# Patient Record
Sex: Female | Born: 1975 | Race: White | Hispanic: No | Smoking: Former smoker
Health system: Southern US, Community
[De-identification: ages and names within clinical notes are randomized; demographics above are authoritative.]

## PROBLEM LIST (undated history)

## (undated) DIAGNOSIS — R87619 Unspecified abnormal cytological findings in specimens from cervix uteri: Secondary | ICD-10-CM

## (undated) DIAGNOSIS — M546 Pain in thoracic spine: Secondary | ICD-10-CM

## (undated) DIAGNOSIS — F411 Generalized anxiety disorder: Secondary | ICD-10-CM

## (undated) DIAGNOSIS — F410 Panic disorder [episodic paroxysmal anxiety] without agoraphobia: Secondary | ICD-10-CM

## (undated) DIAGNOSIS — K439 Ventral hernia without obstruction or gangrene: Secondary | ICD-10-CM

## (undated) DIAGNOSIS — R002 Palpitations: Secondary | ICD-10-CM

## (undated) DIAGNOSIS — IMO0002 Reserved for concepts with insufficient information to code with codable children: Secondary | ICD-10-CM

## (undated) DIAGNOSIS — N390 Urinary tract infection, site not specified: Secondary | ICD-10-CM

## (undated) DIAGNOSIS — M5412 Radiculopathy, cervical region: Secondary | ICD-10-CM

## (undated) DIAGNOSIS — M797 Fibromyalgia: Secondary | ICD-10-CM

## (undated) DIAGNOSIS — F341 Dysthymic disorder: Secondary | ICD-10-CM

## (undated) DIAGNOSIS — M069 Rheumatoid arthritis, unspecified: Secondary | ICD-10-CM

## (undated) DIAGNOSIS — F17200 Nicotine dependence, unspecified, uncomplicated: Secondary | ICD-10-CM

## (undated) DIAGNOSIS — R5381 Other malaise: Secondary | ICD-10-CM

## (undated) DIAGNOSIS — T8859XA Other complications of anesthesia, initial encounter: Secondary | ICD-10-CM

## (undated) DIAGNOSIS — M1990 Unspecified osteoarthritis, unspecified site: Secondary | ICD-10-CM

## (undated) DIAGNOSIS — J309 Allergic rhinitis, unspecified: Secondary | ICD-10-CM

## (undated) DIAGNOSIS — R1310 Dysphagia, unspecified: Secondary | ICD-10-CM

## (undated) DIAGNOSIS — A630 Anogenital (venereal) warts: Secondary | ICD-10-CM

## (undated) DIAGNOSIS — K219 Gastro-esophageal reflux disease without esophagitis: Secondary | ICD-10-CM

## (undated) DIAGNOSIS — N809 Endometriosis, unspecified: Secondary | ICD-10-CM

## (undated) DIAGNOSIS — F1110 Opioid abuse, uncomplicated: Secondary | ICD-10-CM

## (undated) DIAGNOSIS — F319 Bipolar disorder, unspecified: Secondary | ICD-10-CM

## (undated) DIAGNOSIS — B009 Herpesviral infection, unspecified: Secondary | ICD-10-CM

## (undated) DIAGNOSIS — N83209 Unspecified ovarian cyst, unspecified side: Secondary | ICD-10-CM

## (undated) DIAGNOSIS — K222 Esophageal obstruction: Secondary | ICD-10-CM

## (undated) DIAGNOSIS — R5383 Other fatigue: Secondary | ICD-10-CM

## (undated) DIAGNOSIS — K5900 Constipation, unspecified: Secondary | ICD-10-CM

## (undated) DIAGNOSIS — N289 Disorder of kidney and ureter, unspecified: Secondary | ICD-10-CM

## (undated) DIAGNOSIS — L819 Disorder of pigmentation, unspecified: Secondary | ICD-10-CM

## (undated) DIAGNOSIS — F509 Eating disorder, unspecified: Secondary | ICD-10-CM

## (undated) DIAGNOSIS — R630 Anorexia: Secondary | ICD-10-CM

## (undated) DIAGNOSIS — J984 Other disorders of lung: Secondary | ICD-10-CM

## (undated) HISTORY — PX: OTHER SURGICAL HISTORY: SHX169

## (undated) HISTORY — PX: HERNIA REPAIR: SHX51

## (undated) HISTORY — PX: ENDOMETRIAL ABLATION: SHX621

## (undated) HISTORY — DX: Constipation, unspecified: K59.00

## (undated) HISTORY — PX: DILATION AND CURETTAGE OF UTERUS: SHX78

## (undated) HISTORY — DX: Anorexia: R63.0

## (undated) HISTORY — PX: PARTIAL HYSTERECTOMY: SHX80

## (undated) HISTORY — PX: GUM SURGERY: SHX658

## (undated) HISTORY — DX: Anogenital (venereal) warts: A63.0

## (undated) HISTORY — DX: Esophageal obstruction: K22.2

## (undated) HISTORY — PX: ABDOMINAL SURGERY: SHX537

## (undated) HISTORY — DX: Allergic rhinitis, unspecified: J30.9

## (undated) HISTORY — DX: Eating disorder, unspecified: F50.9

## (undated) HISTORY — DX: Generalized anxiety disorder: F41.1

## (undated) HISTORY — DX: Other malaise: R53.81

## (undated) HISTORY — DX: Gastro-esophageal reflux disease without esophagitis: K21.9

## (undated) HISTORY — PX: SHOULDER SURGERY: SHX246

## (undated) HISTORY — DX: Palpitations: R00.2

## (undated) HISTORY — DX: Other fatigue: R53.83

## (undated) HISTORY — DX: Dysphagia, unspecified: R13.10

## (undated) HISTORY — DX: Disorder of pigmentation, unspecified: L81.9

## (undated) HISTORY — DX: Radiculopathy, cervical region: M54.12

## (undated) HISTORY — PX: ABLATION ON ENDOMETRIOSIS: SHX5787

## (undated) HISTORY — DX: Dysthymic disorder: F34.1

## (undated) HISTORY — DX: Ventral hernia without obstruction or gangrene: K43.9

## (undated) HISTORY — DX: Pain in thoracic spine: M54.6

## (undated) HISTORY — DX: Herpesviral infection, unspecified: B00.9

## (undated) HISTORY — DX: Bipolar disorder, unspecified: F31.9

## (undated) HISTORY — DX: Endometriosis, unspecified: N80.9

## (undated) HISTORY — DX: Other disorders of lung: J98.4

## (undated) HISTORY — DX: Urinary tract infection, site not specified: N39.0

## (undated) HISTORY — DX: Nicotine dependence, unspecified, uncomplicated: F17.200

## (undated) HISTORY — PX: ABDOMINAL WALL MESH  REMOVAL: SHX1116

## (undated) HISTORY — DX: Unspecified osteoarthritis, unspecified site: M19.90

---

## 1998-02-04 ENCOUNTER — Encounter: Admission: RE | Admit: 1998-02-04 | Discharge: 1998-02-04 | Payer: Self-pay | Admitting: Sports Medicine

## 1998-02-07 ENCOUNTER — Encounter: Admission: RE | Admit: 1998-02-07 | Discharge: 1998-02-07 | Payer: Self-pay | Admitting: Family Medicine

## 1998-02-13 ENCOUNTER — Encounter: Admission: RE | Admit: 1998-02-13 | Discharge: 1998-02-13 | Payer: Self-pay | Admitting: Family Medicine

## 1998-02-15 ENCOUNTER — Emergency Department (HOSPITAL_COMMUNITY): Admission: EM | Admit: 1998-02-15 | Discharge: 1998-02-15 | Payer: Self-pay | Admitting: Emergency Medicine

## 1998-02-20 ENCOUNTER — Encounter: Admission: RE | Admit: 1998-02-20 | Discharge: 1998-02-20 | Payer: Self-pay | Admitting: Family Medicine

## 1998-05-09 ENCOUNTER — Encounter: Admission: RE | Admit: 1998-05-09 | Discharge: 1998-05-09 | Payer: Self-pay | Admitting: Family Medicine

## 1998-06-01 ENCOUNTER — Emergency Department (HOSPITAL_COMMUNITY): Admission: EM | Admit: 1998-06-01 | Discharge: 1998-06-01 | Payer: Self-pay | Admitting: Emergency Medicine

## 1998-07-02 ENCOUNTER — Encounter: Admission: RE | Admit: 1998-07-02 | Discharge: 1998-07-02 | Payer: Self-pay | Admitting: Family Medicine

## 1998-07-10 ENCOUNTER — Encounter: Admission: RE | Admit: 1998-07-10 | Discharge: 1998-07-10 | Payer: Self-pay | Admitting: Family Medicine

## 1998-08-20 ENCOUNTER — Encounter: Admission: RE | Admit: 1998-08-20 | Discharge: 1998-08-20 | Payer: Self-pay | Admitting: Family Medicine

## 1998-08-27 ENCOUNTER — Encounter: Admission: RE | Admit: 1998-08-27 | Discharge: 1998-08-27 | Payer: Self-pay | Admitting: Family Medicine

## 1998-09-30 ENCOUNTER — Encounter: Admission: RE | Admit: 1998-09-30 | Discharge: 1998-09-30 | Payer: Self-pay | Admitting: Family Medicine

## 1998-10-10 ENCOUNTER — Emergency Department (HOSPITAL_COMMUNITY): Admission: EM | Admit: 1998-10-10 | Discharge: 1998-10-10 | Payer: Self-pay | Admitting: Emergency Medicine

## 1998-10-20 ENCOUNTER — Other Ambulatory Visit: Admission: RE | Admit: 1998-10-20 | Discharge: 1998-10-20 | Payer: Self-pay | Admitting: Family Medicine

## 1998-10-20 ENCOUNTER — Encounter: Admission: RE | Admit: 1998-10-20 | Discharge: 1998-10-20 | Payer: Self-pay | Admitting: Sports Medicine

## 1999-01-26 ENCOUNTER — Encounter: Admission: RE | Admit: 1999-01-26 | Discharge: 1999-01-26 | Payer: Self-pay | Admitting: Family Medicine

## 1999-03-09 ENCOUNTER — Encounter: Admission: RE | Admit: 1999-03-09 | Discharge: 1999-03-09 | Payer: Self-pay | Admitting: Family Medicine

## 1999-03-16 ENCOUNTER — Emergency Department (HOSPITAL_COMMUNITY): Admission: EM | Admit: 1999-03-16 | Discharge: 1999-03-16 | Payer: Self-pay | Admitting: Emergency Medicine

## 1999-04-15 ENCOUNTER — Encounter: Admission: RE | Admit: 1999-04-15 | Discharge: 1999-04-15 | Payer: Self-pay | Admitting: Family Medicine

## 1999-04-17 ENCOUNTER — Encounter: Admission: RE | Admit: 1999-04-17 | Discharge: 1999-04-17 | Payer: Self-pay | Admitting: Family Medicine

## 1999-04-23 ENCOUNTER — Encounter: Admission: RE | Admit: 1999-04-23 | Discharge: 1999-04-23 | Payer: Self-pay | Admitting: Family Medicine

## 1999-04-30 ENCOUNTER — Encounter: Admission: RE | Admit: 1999-04-30 | Discharge: 1999-04-30 | Payer: Self-pay | Admitting: Sports Medicine

## 1999-05-26 ENCOUNTER — Emergency Department (HOSPITAL_COMMUNITY): Admission: EM | Admit: 1999-05-26 | Discharge: 1999-05-26 | Payer: Self-pay | Admitting: Emergency Medicine

## 1999-05-28 ENCOUNTER — Encounter: Admission: RE | Admit: 1999-05-28 | Discharge: 1999-05-28 | Payer: Self-pay | Admitting: Family Medicine

## 1999-07-28 ENCOUNTER — Encounter: Admission: RE | Admit: 1999-07-28 | Discharge: 1999-07-28 | Payer: Self-pay | Admitting: Family Medicine

## 1999-08-28 ENCOUNTER — Encounter: Admission: RE | Admit: 1999-08-28 | Discharge: 1999-08-28 | Payer: Self-pay | Admitting: Sports Medicine

## 1999-09-17 ENCOUNTER — Encounter: Admission: RE | Admit: 1999-09-17 | Discharge: 1999-09-17 | Payer: Self-pay | Admitting: Family Medicine

## 1999-12-09 ENCOUNTER — Inpatient Hospital Stay (HOSPITAL_COMMUNITY): Admission: AD | Admit: 1999-12-09 | Discharge: 1999-12-09 | Payer: Self-pay | Admitting: *Deleted

## 1999-12-09 ENCOUNTER — Encounter: Payer: Self-pay | Admitting: *Deleted

## 2000-02-05 ENCOUNTER — Encounter: Admission: RE | Admit: 2000-02-05 | Discharge: 2000-02-05 | Payer: Self-pay | Admitting: Family Medicine

## 2000-02-28 ENCOUNTER — Inpatient Hospital Stay (HOSPITAL_COMMUNITY): Admission: AD | Admit: 2000-02-28 | Discharge: 2000-02-28 | Payer: Self-pay | Admitting: Obstetrics & Gynecology

## 2000-02-29 ENCOUNTER — Inpatient Hospital Stay (HOSPITAL_COMMUNITY): Admission: RE | Admit: 2000-02-29 | Discharge: 2000-02-29 | Payer: Self-pay | Admitting: Family Medicine

## 2000-02-29 ENCOUNTER — Encounter: Payer: Self-pay | Admitting: Family Medicine

## 2000-03-03 ENCOUNTER — Encounter: Admission: RE | Admit: 2000-03-03 | Discharge: 2000-03-03 | Payer: Self-pay | Admitting: Obstetrics

## 2000-03-10 ENCOUNTER — Encounter: Payer: Self-pay | Admitting: Emergency Medicine

## 2000-03-10 ENCOUNTER — Emergency Department (HOSPITAL_COMMUNITY): Admission: EM | Admit: 2000-03-10 | Discharge: 2000-03-10 | Payer: Self-pay | Admitting: Emergency Medicine

## 2000-03-28 ENCOUNTER — Emergency Department (HOSPITAL_COMMUNITY): Admission: EM | Admit: 2000-03-28 | Discharge: 2000-03-28 | Payer: Self-pay | Admitting: *Deleted

## 2000-04-04 ENCOUNTER — Emergency Department (HOSPITAL_COMMUNITY): Admission: EM | Admit: 2000-04-04 | Discharge: 2000-04-04 | Payer: Self-pay | Admitting: Emergency Medicine

## 2000-04-06 ENCOUNTER — Emergency Department (HOSPITAL_COMMUNITY): Admission: EM | Admit: 2000-04-06 | Discharge: 2000-04-06 | Payer: Self-pay | Admitting: Emergency Medicine

## 2000-04-13 ENCOUNTER — Emergency Department (HOSPITAL_COMMUNITY): Admission: EM | Admit: 2000-04-13 | Discharge: 2000-04-13 | Payer: Self-pay | Admitting: Emergency Medicine

## 2000-05-11 ENCOUNTER — Inpatient Hospital Stay (HOSPITAL_COMMUNITY): Admission: AD | Admit: 2000-05-11 | Discharge: 2000-05-11 | Payer: Self-pay | Admitting: *Deleted

## 2000-05-12 ENCOUNTER — Encounter: Admission: RE | Admit: 2000-05-12 | Discharge: 2000-05-12 | Payer: Self-pay | Admitting: Obstetrics

## 2000-05-29 ENCOUNTER — Emergency Department (HOSPITAL_COMMUNITY): Admission: EM | Admit: 2000-05-29 | Discharge: 2000-05-29 | Payer: Self-pay | Admitting: Emergency Medicine

## 2000-07-22 ENCOUNTER — Emergency Department (HOSPITAL_COMMUNITY): Admission: EM | Admit: 2000-07-22 | Discharge: 2000-07-22 | Payer: Self-pay | Admitting: Emergency Medicine

## 2000-08-15 ENCOUNTER — Encounter: Admission: RE | Admit: 2000-08-15 | Discharge: 2000-08-15 | Payer: Self-pay | Admitting: Family Medicine

## 2000-08-22 ENCOUNTER — Encounter: Admission: RE | Admit: 2000-08-22 | Discharge: 2000-08-22 | Payer: Self-pay | Admitting: Family Medicine

## 2000-08-29 ENCOUNTER — Inpatient Hospital Stay (HOSPITAL_COMMUNITY): Admission: AD | Admit: 2000-08-29 | Discharge: 2000-08-29 | Payer: Self-pay | Admitting: Obstetrics

## 2000-08-30 ENCOUNTER — Encounter: Payer: Self-pay | Admitting: Obstetrics

## 2000-08-30 ENCOUNTER — Inpatient Hospital Stay (HOSPITAL_COMMUNITY): Admission: RE | Admit: 2000-08-30 | Discharge: 2000-08-30 | Payer: Self-pay | Admitting: *Deleted

## 2000-09-05 ENCOUNTER — Other Ambulatory Visit: Admission: RE | Admit: 2000-09-05 | Discharge: 2000-09-05 | Payer: Self-pay | Admitting: Obstetrics

## 2000-09-25 ENCOUNTER — Inpatient Hospital Stay (HOSPITAL_COMMUNITY): Admission: AD | Admit: 2000-09-25 | Discharge: 2000-09-25 | Payer: Self-pay | Admitting: Obstetrics

## 2000-09-26 ENCOUNTER — Encounter (INDEPENDENT_AMBULATORY_CARE_PROVIDER_SITE_OTHER): Payer: Self-pay | Admitting: Specialist

## 2000-09-26 ENCOUNTER — Encounter: Payer: Self-pay | Admitting: Obstetrics

## 2000-09-26 ENCOUNTER — Ambulatory Visit (HOSPITAL_COMMUNITY): Admission: AD | Admit: 2000-09-26 | Discharge: 2000-09-26 | Payer: Self-pay | Admitting: Obstetrics

## 2000-11-29 ENCOUNTER — Encounter: Admission: RE | Admit: 2000-11-29 | Discharge: 2000-11-29 | Payer: Self-pay | Admitting: Family Medicine

## 2000-12-02 ENCOUNTER — Encounter: Admission: RE | Admit: 2000-12-02 | Discharge: 2000-12-02 | Payer: Self-pay | Admitting: Family Medicine

## 2000-12-09 ENCOUNTER — Encounter: Admission: RE | Admit: 2000-12-09 | Discharge: 2000-12-09 | Payer: Self-pay | Admitting: Family Medicine

## 2000-12-12 ENCOUNTER — Encounter: Admission: RE | Admit: 2000-12-12 | Discharge: 2000-12-12 | Payer: Self-pay | Admitting: Family Medicine

## 2001-01-16 ENCOUNTER — Inpatient Hospital Stay (HOSPITAL_COMMUNITY): Admission: AD | Admit: 2001-01-16 | Discharge: 2001-01-16 | Payer: Self-pay | Admitting: Obstetrics & Gynecology

## 2001-01-20 ENCOUNTER — Encounter: Payer: Self-pay | Admitting: Obstetrics & Gynecology

## 2001-01-20 ENCOUNTER — Inpatient Hospital Stay (HOSPITAL_COMMUNITY): Admission: AD | Admit: 2001-01-20 | Discharge: 2001-01-20 | Payer: Self-pay | Admitting: *Deleted

## 2001-02-02 ENCOUNTER — Encounter: Payer: Self-pay | Admitting: Obstetrics

## 2001-02-02 ENCOUNTER — Ambulatory Visit (HOSPITAL_COMMUNITY): Admission: RE | Admit: 2001-02-02 | Discharge: 2001-02-02 | Payer: Self-pay | Admitting: Obstetrics

## 2001-02-08 ENCOUNTER — Encounter: Admission: RE | Admit: 2001-02-08 | Discharge: 2001-02-08 | Payer: Self-pay | Admitting: Family Medicine

## 2001-02-13 ENCOUNTER — Encounter: Admission: RE | Admit: 2001-02-13 | Discharge: 2001-02-13 | Payer: Self-pay | Admitting: Family Medicine

## 2001-02-15 ENCOUNTER — Encounter: Admission: RE | Admit: 2001-02-15 | Discharge: 2001-02-15 | Payer: Self-pay | Admitting: Family Medicine

## 2001-02-20 ENCOUNTER — Encounter: Admission: RE | Admit: 2001-02-20 | Discharge: 2001-02-20 | Payer: Self-pay | Admitting: Family Medicine

## 2001-03-01 ENCOUNTER — Encounter (INDEPENDENT_AMBULATORY_CARE_PROVIDER_SITE_OTHER): Payer: Self-pay | Admitting: *Deleted

## 2001-03-23 ENCOUNTER — Other Ambulatory Visit: Admission: RE | Admit: 2001-03-23 | Discharge: 2001-03-23 | Payer: Self-pay | Admitting: Family Medicine

## 2001-03-23 ENCOUNTER — Encounter: Admission: RE | Admit: 2001-03-23 | Discharge: 2001-03-23 | Payer: Self-pay | Admitting: Family Medicine

## 2001-04-22 ENCOUNTER — Inpatient Hospital Stay (HOSPITAL_COMMUNITY): Admission: AD | Admit: 2001-04-22 | Discharge: 2001-04-22 | Payer: Self-pay | Admitting: *Deleted

## 2001-04-24 ENCOUNTER — Encounter: Admission: RE | Admit: 2001-04-24 | Discharge: 2001-04-24 | Payer: Self-pay | Admitting: Family Medicine

## 2001-05-10 ENCOUNTER — Ambulatory Visit (HOSPITAL_COMMUNITY): Admission: RE | Admit: 2001-05-10 | Discharge: 2001-05-10 | Payer: Self-pay | Admitting: Family Medicine

## 2001-05-11 ENCOUNTER — Encounter: Admission: RE | Admit: 2001-05-11 | Discharge: 2001-05-11 | Payer: Self-pay | Admitting: Family Medicine

## 2001-05-15 ENCOUNTER — Inpatient Hospital Stay (HOSPITAL_COMMUNITY): Admission: AD | Admit: 2001-05-15 | Discharge: 2001-05-15 | Payer: Self-pay | Admitting: Obstetrics & Gynecology

## 2001-05-24 ENCOUNTER — Encounter: Admission: RE | Admit: 2001-05-24 | Discharge: 2001-05-24 | Payer: Self-pay | Admitting: Family Medicine

## 2001-06-05 ENCOUNTER — Encounter: Admission: RE | Admit: 2001-06-05 | Discharge: 2001-06-05 | Payer: Self-pay | Admitting: Family Medicine

## 2001-06-29 ENCOUNTER — Encounter: Admission: RE | Admit: 2001-06-29 | Discharge: 2001-06-29 | Payer: Self-pay | Admitting: Family Medicine

## 2001-07-14 ENCOUNTER — Encounter: Admission: RE | Admit: 2001-07-14 | Discharge: 2001-07-14 | Payer: Self-pay | Admitting: Family Medicine

## 2001-07-19 ENCOUNTER — Inpatient Hospital Stay (HOSPITAL_COMMUNITY): Admission: AD | Admit: 2001-07-19 | Discharge: 2001-07-19 | Payer: Self-pay | Admitting: Obstetrics

## 2001-07-28 ENCOUNTER — Encounter: Admission: RE | Admit: 2001-07-28 | Discharge: 2001-07-28 | Payer: Self-pay | Admitting: Family Medicine

## 2001-08-28 ENCOUNTER — Encounter: Admission: RE | Admit: 2001-08-28 | Discharge: 2001-08-28 | Payer: Self-pay | Admitting: Family Medicine

## 2001-08-30 ENCOUNTER — Inpatient Hospital Stay (HOSPITAL_COMMUNITY): Admission: AD | Admit: 2001-08-30 | Discharge: 2001-08-30 | Payer: Self-pay | Admitting: Obstetrics

## 2001-09-03 ENCOUNTER — Inpatient Hospital Stay (HOSPITAL_COMMUNITY): Admission: AD | Admit: 2001-09-03 | Discharge: 2001-09-03 | Payer: Self-pay | Admitting: Obstetrics & Gynecology

## 2001-09-04 ENCOUNTER — Encounter: Admission: RE | Admit: 2001-09-04 | Discharge: 2001-09-04 | Payer: Self-pay | Admitting: Sports Medicine

## 2001-09-11 ENCOUNTER — Encounter: Admission: RE | Admit: 2001-09-11 | Discharge: 2001-09-11 | Payer: Self-pay | Admitting: Sports Medicine

## 2001-09-15 ENCOUNTER — Inpatient Hospital Stay (HOSPITAL_COMMUNITY): Admission: AD | Admit: 2001-09-15 | Discharge: 2001-09-15 | Payer: Self-pay | Admitting: Obstetrics & Gynecology

## 2001-09-15 ENCOUNTER — Encounter: Payer: Self-pay | Admitting: Obstetrics & Gynecology

## 2001-09-18 ENCOUNTER — Encounter: Admission: RE | Admit: 2001-09-18 | Discharge: 2001-09-18 | Payer: Self-pay | Admitting: Sports Medicine

## 2001-09-20 ENCOUNTER — Encounter (INDEPENDENT_AMBULATORY_CARE_PROVIDER_SITE_OTHER): Payer: Self-pay

## 2001-09-20 ENCOUNTER — Inpatient Hospital Stay (HOSPITAL_COMMUNITY): Admission: AD | Admit: 2001-09-20 | Discharge: 2001-09-23 | Payer: Self-pay | Admitting: Obstetrics

## 2001-09-25 ENCOUNTER — Encounter: Admission: RE | Admit: 2001-09-25 | Discharge: 2001-09-25 | Payer: Self-pay | Admitting: Family Medicine

## 2001-09-26 ENCOUNTER — Encounter: Admission: RE | Admit: 2001-09-26 | Discharge: 2001-09-26 | Payer: Self-pay | Admitting: Family Medicine

## 2001-10-11 ENCOUNTER — Encounter: Admission: RE | Admit: 2001-10-11 | Discharge: 2001-10-11 | Payer: Self-pay | Admitting: Family Medicine

## 2001-11-06 ENCOUNTER — Encounter: Admission: RE | Admit: 2001-11-06 | Discharge: 2001-11-06 | Payer: Self-pay | Admitting: Family Medicine

## 2002-01-04 ENCOUNTER — Encounter: Payer: Self-pay | Admitting: Emergency Medicine

## 2002-01-04 ENCOUNTER — Emergency Department (HOSPITAL_COMMUNITY): Admission: EM | Admit: 2002-01-04 | Discharge: 2002-01-04 | Payer: Self-pay | Admitting: Emergency Medicine

## 2002-01-10 ENCOUNTER — Encounter: Admission: RE | Admit: 2002-01-10 | Discharge: 2002-04-09 | Payer: Self-pay | Admitting: Sports Medicine

## 2002-04-21 ENCOUNTER — Emergency Department (HOSPITAL_COMMUNITY): Admission: EM | Admit: 2002-04-21 | Discharge: 2002-04-22 | Payer: Self-pay | Admitting: Emergency Medicine

## 2002-08-18 ENCOUNTER — Emergency Department (HOSPITAL_COMMUNITY): Admission: EM | Admit: 2002-08-18 | Discharge: 2002-08-19 | Payer: Self-pay | Admitting: Emergency Medicine

## 2002-11-05 ENCOUNTER — Other Ambulatory Visit: Admission: RE | Admit: 2002-11-05 | Discharge: 2002-11-05 | Payer: Self-pay | Admitting: Obstetrics & Gynecology

## 2003-01-15 ENCOUNTER — Encounter: Payer: Self-pay | Admitting: Emergency Medicine

## 2003-01-15 ENCOUNTER — Emergency Department (HOSPITAL_COMMUNITY): Admission: EM | Admit: 2003-01-15 | Discharge: 2003-01-15 | Payer: Self-pay | Admitting: Emergency Medicine

## 2003-02-12 ENCOUNTER — Emergency Department (HOSPITAL_COMMUNITY): Admission: EM | Admit: 2003-02-12 | Discharge: 2003-02-13 | Payer: Self-pay | Admitting: Emergency Medicine

## 2003-02-14 ENCOUNTER — Encounter: Payer: Self-pay | Admitting: Emergency Medicine

## 2003-02-14 ENCOUNTER — Emergency Department (HOSPITAL_COMMUNITY): Admission: EM | Admit: 2003-02-14 | Discharge: 2003-02-14 | Payer: Self-pay | Admitting: Emergency Medicine

## 2003-04-06 ENCOUNTER — Emergency Department (HOSPITAL_COMMUNITY): Admission: EM | Admit: 2003-04-06 | Discharge: 2003-04-06 | Payer: Self-pay | Admitting: Emergency Medicine

## 2003-05-26 ENCOUNTER — Emergency Department (HOSPITAL_COMMUNITY): Admission: EM | Admit: 2003-05-26 | Discharge: 2003-05-27 | Payer: Self-pay | Admitting: Emergency Medicine

## 2003-05-27 ENCOUNTER — Encounter: Payer: Self-pay | Admitting: General Surgery

## 2003-05-27 ENCOUNTER — Encounter: Admission: RE | Admit: 2003-05-27 | Discharge: 2003-05-27 | Payer: Self-pay | Admitting: General Surgery

## 2003-05-31 ENCOUNTER — Emergency Department (HOSPITAL_COMMUNITY): Admission: EM | Admit: 2003-05-31 | Discharge: 2003-06-01 | Payer: Self-pay | Admitting: Emergency Medicine

## 2003-06-01 ENCOUNTER — Encounter: Payer: Self-pay | Admitting: Emergency Medicine

## 2003-06-06 ENCOUNTER — Inpatient Hospital Stay (HOSPITAL_COMMUNITY): Admission: EM | Admit: 2003-06-06 | Discharge: 2003-06-13 | Payer: Self-pay | Admitting: Emergency Medicine

## 2003-06-06 ENCOUNTER — Encounter: Payer: Self-pay | Admitting: Gastroenterology

## 2003-06-06 ENCOUNTER — Encounter: Payer: Self-pay | Admitting: Emergency Medicine

## 2003-06-07 ENCOUNTER — Encounter: Payer: Self-pay | Admitting: Gastroenterology

## 2003-06-08 ENCOUNTER — Encounter: Payer: Self-pay | Admitting: Gastroenterology

## 2003-06-10 ENCOUNTER — Encounter: Payer: Self-pay | Admitting: *Deleted

## 2003-06-13 ENCOUNTER — Encounter: Payer: Self-pay | Admitting: Internal Medicine

## 2003-06-18 ENCOUNTER — Ambulatory Visit (HOSPITAL_COMMUNITY): Admission: RE | Admit: 2003-06-18 | Discharge: 2003-06-18 | Payer: Self-pay | Admitting: Obstetrics and Gynecology

## 2003-08-05 ENCOUNTER — Inpatient Hospital Stay (HOSPITAL_COMMUNITY): Admission: AD | Admit: 2003-08-05 | Discharge: 2003-08-05 | Payer: Self-pay | Admitting: Obstetrics

## 2003-08-06 ENCOUNTER — Inpatient Hospital Stay (HOSPITAL_COMMUNITY): Admission: AD | Admit: 2003-08-06 | Discharge: 2003-08-08 | Payer: Self-pay | Admitting: Obstetrics & Gynecology

## 2003-08-21 ENCOUNTER — Inpatient Hospital Stay (HOSPITAL_COMMUNITY): Admission: AD | Admit: 2003-08-21 | Discharge: 2003-08-21 | Payer: Self-pay | Admitting: Obstetrics

## 2003-08-22 ENCOUNTER — Observation Stay (HOSPITAL_COMMUNITY): Admission: AD | Admit: 2003-08-22 | Discharge: 2003-08-23 | Payer: Self-pay | Admitting: Obstetrics & Gynecology

## 2003-08-29 ENCOUNTER — Emergency Department (HOSPITAL_COMMUNITY): Admission: EM | Admit: 2003-08-29 | Discharge: 2003-08-29 | Payer: Self-pay | Admitting: Emergency Medicine

## 2003-11-06 ENCOUNTER — Inpatient Hospital Stay (HOSPITAL_COMMUNITY): Admission: AD | Admit: 2003-11-06 | Discharge: 2003-11-06 | Payer: Self-pay | Admitting: Obstetrics & Gynecology

## 2003-11-08 ENCOUNTER — Inpatient Hospital Stay (HOSPITAL_COMMUNITY): Admission: AD | Admit: 2003-11-08 | Discharge: 2003-11-08 | Payer: Self-pay | Admitting: Obstetrics

## 2003-11-25 ENCOUNTER — Other Ambulatory Visit: Admission: RE | Admit: 2003-11-25 | Discharge: 2003-11-25 | Payer: Self-pay | Admitting: Gynecology

## 2003-12-25 ENCOUNTER — Emergency Department (HOSPITAL_COMMUNITY): Admission: EM | Admit: 2003-12-25 | Discharge: 2003-12-26 | Payer: Self-pay | Admitting: Emergency Medicine

## 2003-12-26 ENCOUNTER — Encounter: Admission: RE | Admit: 2003-12-26 | Discharge: 2004-03-25 | Payer: Self-pay | Admitting: Obstetrics and Gynecology

## 2004-02-06 ENCOUNTER — Inpatient Hospital Stay (HOSPITAL_COMMUNITY): Admission: AD | Admit: 2004-02-06 | Discharge: 2004-02-07 | Payer: Self-pay | Admitting: Obstetrics and Gynecology

## 2004-02-10 ENCOUNTER — Inpatient Hospital Stay (HOSPITAL_COMMUNITY): Admission: AD | Admit: 2004-02-10 | Discharge: 2004-02-15 | Payer: Self-pay | Admitting: Obstetrics and Gynecology

## 2004-04-16 ENCOUNTER — Other Ambulatory Visit: Admission: RE | Admit: 2004-04-16 | Discharge: 2004-04-16 | Payer: Self-pay | Admitting: Obstetrics & Gynecology

## 2004-04-29 ENCOUNTER — Observation Stay (HOSPITAL_COMMUNITY): Admission: AD | Admit: 2004-04-29 | Discharge: 2004-04-30 | Payer: Self-pay | Admitting: Obstetrics and Gynecology

## 2004-06-05 ENCOUNTER — Inpatient Hospital Stay (HOSPITAL_COMMUNITY): Admission: AD | Admit: 2004-06-05 | Discharge: 2004-06-05 | Payer: Self-pay | Admitting: Obstetrics and Gynecology

## 2004-06-07 ENCOUNTER — Inpatient Hospital Stay (HOSPITAL_COMMUNITY): Admission: AD | Admit: 2004-06-07 | Discharge: 2004-06-08 | Payer: Self-pay | Admitting: Obstetrics and Gynecology

## 2004-06-22 ENCOUNTER — Emergency Department (HOSPITAL_COMMUNITY): Admission: EM | Admit: 2004-06-22 | Discharge: 2004-06-23 | Payer: Self-pay | Admitting: Emergency Medicine

## 2004-06-30 ENCOUNTER — Encounter: Admission: RE | Admit: 2004-06-30 | Discharge: 2004-06-30 | Payer: Self-pay | Admitting: Family Medicine

## 2004-07-07 ENCOUNTER — Encounter (INDEPENDENT_AMBULATORY_CARE_PROVIDER_SITE_OTHER): Payer: Self-pay | Admitting: Specialist

## 2004-07-07 ENCOUNTER — Observation Stay (HOSPITAL_COMMUNITY): Admission: RE | Admit: 2004-07-07 | Discharge: 2004-07-08 | Payer: Self-pay | Admitting: Obstetrics & Gynecology

## 2004-07-14 ENCOUNTER — Observation Stay (HOSPITAL_COMMUNITY): Admission: AD | Admit: 2004-07-14 | Discharge: 2004-07-14 | Payer: Self-pay | Admitting: Obstetrics and Gynecology

## 2004-11-21 ENCOUNTER — Emergency Department (HOSPITAL_COMMUNITY): Admission: EM | Admit: 2004-11-21 | Discharge: 2004-11-22 | Payer: Self-pay | Admitting: Emergency Medicine

## 2005-01-05 ENCOUNTER — Ambulatory Visit: Payer: Self-pay | Admitting: Internal Medicine

## 2005-01-12 ENCOUNTER — Ambulatory Visit: Payer: Self-pay | Admitting: Internal Medicine

## 2005-01-19 ENCOUNTER — Ambulatory Visit: Payer: Self-pay | Admitting: Internal Medicine

## 2005-03-25 ENCOUNTER — Ambulatory Visit: Payer: Self-pay | Admitting: Internal Medicine

## 2005-04-12 ENCOUNTER — Ambulatory Visit: Payer: Self-pay | Admitting: Family Medicine

## 2005-04-16 ENCOUNTER — Observation Stay (HOSPITAL_COMMUNITY): Admission: RE | Admit: 2005-04-16 | Discharge: 2005-04-17 | Payer: Self-pay | Admitting: General Surgery

## 2005-06-10 ENCOUNTER — Ambulatory Visit: Payer: Self-pay | Admitting: Internal Medicine

## 2005-06-17 ENCOUNTER — Encounter: Admission: RE | Admit: 2005-06-17 | Discharge: 2005-06-17 | Payer: Self-pay | Admitting: Surgery

## 2005-07-20 ENCOUNTER — Emergency Department (HOSPITAL_COMMUNITY): Admission: EM | Admit: 2005-07-20 | Discharge: 2005-07-20 | Payer: Self-pay | Admitting: Emergency Medicine

## 2005-08-03 ENCOUNTER — Emergency Department (HOSPITAL_COMMUNITY): Admission: EM | Admit: 2005-08-03 | Discharge: 2005-08-04 | Payer: Self-pay | Admitting: Emergency Medicine

## 2005-08-17 ENCOUNTER — Emergency Department (HOSPITAL_COMMUNITY): Admission: EM | Admit: 2005-08-17 | Discharge: 2005-08-18 | Payer: Self-pay | Admitting: Emergency Medicine

## 2005-09-16 ENCOUNTER — Ambulatory Visit: Payer: Self-pay | Admitting: Internal Medicine

## 2005-09-26 ENCOUNTER — Emergency Department (HOSPITAL_COMMUNITY): Admission: EM | Admit: 2005-09-26 | Discharge: 2005-09-26 | Payer: Self-pay | Admitting: Family Medicine

## 2005-11-02 ENCOUNTER — Encounter: Admission: RE | Admit: 2005-11-02 | Discharge: 2005-12-14 | Payer: Self-pay | Admitting: General Surgery

## 2005-12-20 ENCOUNTER — Emergency Department (HOSPITAL_COMMUNITY): Admission: EM | Admit: 2005-12-20 | Discharge: 2005-12-21 | Payer: Self-pay | Admitting: Emergency Medicine

## 2005-12-28 ENCOUNTER — Inpatient Hospital Stay (HOSPITAL_COMMUNITY): Admission: RE | Admit: 2005-12-28 | Discharge: 2005-12-30 | Payer: Self-pay | Admitting: General Surgery

## 2006-04-24 ENCOUNTER — Emergency Department (HOSPITAL_COMMUNITY): Admission: EM | Admit: 2006-04-24 | Discharge: 2006-04-24 | Payer: Self-pay | Admitting: Family Medicine

## 2006-05-05 ENCOUNTER — Inpatient Hospital Stay (HOSPITAL_COMMUNITY): Admission: AD | Admit: 2006-05-05 | Discharge: 2006-05-07 | Payer: Self-pay | Admitting: *Deleted

## 2006-05-06 ENCOUNTER — Ambulatory Visit: Payer: Self-pay | Admitting: *Deleted

## 2006-06-03 ENCOUNTER — Emergency Department (HOSPITAL_COMMUNITY): Admission: EM | Admit: 2006-06-03 | Discharge: 2006-06-04 | Payer: Self-pay | Admitting: Emergency Medicine

## 2006-06-06 ENCOUNTER — Observation Stay (HOSPITAL_COMMUNITY): Admission: EM | Admit: 2006-06-06 | Discharge: 2006-06-08 | Payer: Self-pay | Admitting: Emergency Medicine

## 2006-07-06 ENCOUNTER — Emergency Department (HOSPITAL_COMMUNITY): Admission: EM | Admit: 2006-07-06 | Discharge: 2006-07-06 | Payer: Self-pay | Admitting: Emergency Medicine

## 2006-07-18 ENCOUNTER — Emergency Department (HOSPITAL_COMMUNITY): Admission: EM | Admit: 2006-07-18 | Discharge: 2006-07-18 | Payer: Self-pay | Admitting: Emergency Medicine

## 2006-09-01 ENCOUNTER — Emergency Department (HOSPITAL_COMMUNITY): Admission: EM | Admit: 2006-09-01 | Discharge: 2006-09-01 | Payer: Self-pay | Admitting: Family Medicine

## 2006-10-14 ENCOUNTER — Encounter: Admission: RE | Admit: 2006-10-14 | Discharge: 2006-10-14 | Payer: Self-pay | Admitting: General Surgery

## 2006-10-21 ENCOUNTER — Emergency Department (HOSPITAL_COMMUNITY): Admission: EM | Admit: 2006-10-21 | Discharge: 2006-10-21 | Payer: Self-pay | Admitting: Emergency Medicine

## 2006-12-05 ENCOUNTER — Ambulatory Visit (HOSPITAL_COMMUNITY): Admission: RE | Admit: 2006-12-05 | Discharge: 2006-12-05 | Payer: Self-pay | Admitting: General Surgery

## 2006-12-30 ENCOUNTER — Encounter (INDEPENDENT_AMBULATORY_CARE_PROVIDER_SITE_OTHER): Payer: Self-pay | Admitting: *Deleted

## 2007-01-03 ENCOUNTER — Encounter: Payer: Self-pay | Admitting: Internal Medicine

## 2007-02-02 ENCOUNTER — Ambulatory Visit: Payer: Self-pay | Admitting: Internal Medicine

## 2007-02-02 LAB — CONVERTED CEMR LAB
AST: 17 units/L (ref 0–37)
Basophils Absolute: 0 10*3/uL (ref 0.0–0.1)
Bilirubin, Direct: 0.2 mg/dL (ref 0.0–0.3)
Chloride: 110 meq/L (ref 96–112)
Cholesterol: 131 mg/dL (ref 0–200)
Eosinophils Absolute: 0.3 10*3/uL (ref 0.0–0.6)
Eosinophils Relative: 3.9 % (ref 0.0–5.0)
GFR calc non Af Amer: 105 mL/min
Glucose, Bld: 82 mg/dL (ref 70–99)
HCT: 42.8 % (ref 36.0–46.0)
Hemoglobin: 14.8 g/dL (ref 12.0–15.0)
Lymphocytes Relative: 27.4 % (ref 12.0–46.0)
MCHC: 34.6 g/dL (ref 30.0–36.0)
MCV: 94.2 fL (ref 78.0–100.0)
Monocytes Absolute: 0.5 10*3/uL (ref 0.2–0.7)
Neutrophils Relative %: 61.1 % (ref 43.0–77.0)
Potassium: 5 meq/L (ref 3.5–5.1)
RBC: 4.55 M/uL (ref 3.87–5.11)
Sodium: 143 meq/L (ref 135–145)
TSH: 1.84 microintl units/mL (ref 0.35–5.50)
Total Bilirubin: 0.7 mg/dL (ref 0.3–1.2)
Total CHOL/HDL Ratio: 3.4
WBC: 7.1 10*3/uL (ref 4.5–10.5)

## 2007-02-09 ENCOUNTER — Ambulatory Visit: Payer: Self-pay | Admitting: Internal Medicine

## 2007-02-09 LAB — CONVERTED CEMR LAB: Lithium Lvl: 0.45 meq/L — ABNORMAL LOW (ref 0.80–1.40)

## 2007-02-23 ENCOUNTER — Ambulatory Visit: Payer: Self-pay | Admitting: Internal Medicine

## 2007-02-23 LAB — CONVERTED CEMR LAB
Basophils Absolute: 0 10*3/uL (ref 0.0–0.1)
Basophils Relative: 0.3 % (ref 0.0–1.0)
CRP, High Sensitivity: <1 — ABNORMAL LOW (ref 0.00–5.00)
HCT: 40.5 % (ref 36.0–46.0)
MCHC: 34.3 g/dL (ref 30.0–36.0)
Monocytes Relative: 7.3 % (ref 3.0–11.0)
RBC: 4.32 M/uL (ref 3.87–5.11)
RDW: 11.2 % — ABNORMAL LOW (ref 11.5–14.6)

## 2007-04-06 ENCOUNTER — Ambulatory Visit: Payer: Self-pay | Admitting: Internal Medicine

## 2007-05-04 ENCOUNTER — Ambulatory Visit: Payer: Self-pay | Admitting: Internal Medicine

## 2007-05-09 ENCOUNTER — Encounter: Payer: Self-pay | Admitting: Internal Medicine

## 2007-05-12 ENCOUNTER — Telehealth: Payer: Self-pay | Admitting: Internal Medicine

## 2007-06-02 DIAGNOSIS — J309 Allergic rhinitis, unspecified: Secondary | ICD-10-CM

## 2007-06-02 HISTORY — DX: Allergic rhinitis, unspecified: J30.9

## 2007-06-20 ENCOUNTER — Encounter: Admission: RE | Admit: 2007-06-20 | Discharge: 2007-07-28 | Payer: Self-pay | Admitting: Internal Medicine

## 2007-06-20 ENCOUNTER — Encounter: Payer: Self-pay | Admitting: Internal Medicine

## 2007-07-07 ENCOUNTER — Ambulatory Visit: Payer: Self-pay | Admitting: Internal Medicine

## 2007-07-07 ENCOUNTER — Telehealth (INDEPENDENT_AMBULATORY_CARE_PROVIDER_SITE_OTHER): Payer: Self-pay | Admitting: *Deleted

## 2007-07-07 DIAGNOSIS — R1013 Epigastric pain: Secondary | ICD-10-CM

## 2007-07-07 DIAGNOSIS — M546 Pain in thoracic spine: Secondary | ICD-10-CM

## 2007-07-07 HISTORY — DX: Pain in thoracic spine: M54.6

## 2007-07-14 ENCOUNTER — Telehealth: Payer: Self-pay | Admitting: Internal Medicine

## 2007-07-19 ENCOUNTER — Ambulatory Visit (HOSPITAL_COMMUNITY): Admission: RE | Admit: 2007-07-19 | Discharge: 2007-07-19 | Payer: Self-pay | Admitting: Internal Medicine

## 2007-07-21 ENCOUNTER — Telehealth: Payer: Self-pay | Admitting: Internal Medicine

## 2007-07-28 ENCOUNTER — Encounter: Payer: Self-pay | Admitting: Internal Medicine

## 2007-08-04 ENCOUNTER — Telehealth: Payer: Self-pay | Admitting: Internal Medicine

## 2007-08-14 ENCOUNTER — Ambulatory Visit: Payer: Self-pay | Admitting: Internal Medicine

## 2007-08-23 ENCOUNTER — Encounter: Payer: Self-pay | Admitting: Internal Medicine

## 2007-08-31 ENCOUNTER — Encounter: Admission: RE | Admit: 2007-08-31 | Discharge: 2007-08-31 | Payer: Self-pay | Admitting: Orthopaedic Surgery

## 2007-08-31 ENCOUNTER — Telehealth: Payer: Self-pay | Admitting: Internal Medicine

## 2007-09-01 ENCOUNTER — Telehealth: Payer: Self-pay | Admitting: *Deleted

## 2007-09-25 ENCOUNTER — Ambulatory Visit: Payer: Self-pay | Admitting: Internal Medicine

## 2007-10-19 ENCOUNTER — Telehealth: Payer: Self-pay | Admitting: Internal Medicine

## 2007-11-10 ENCOUNTER — Ambulatory Visit (HOSPITAL_COMMUNITY): Admission: RE | Admit: 2007-11-10 | Discharge: 2007-11-10 | Payer: Self-pay | Admitting: *Deleted

## 2007-11-13 ENCOUNTER — Telehealth: Payer: Self-pay | Admitting: Internal Medicine

## 2007-12-07 ENCOUNTER — Ambulatory Visit: Payer: Self-pay | Admitting: Internal Medicine

## 2007-12-18 ENCOUNTER — Ambulatory Visit: Payer: Self-pay | Admitting: Internal Medicine

## 2008-01-11 ENCOUNTER — Telehealth: Payer: Self-pay | Admitting: Internal Medicine

## 2008-01-16 ENCOUNTER — Emergency Department (HOSPITAL_COMMUNITY): Admission: EM | Admit: 2008-01-16 | Discharge: 2008-01-17 | Payer: Self-pay | Admitting: Emergency Medicine

## 2008-01-18 ENCOUNTER — Telehealth (INDEPENDENT_AMBULATORY_CARE_PROVIDER_SITE_OTHER): Payer: Self-pay | Admitting: *Deleted

## 2008-01-21 ENCOUNTER — Emergency Department (HOSPITAL_COMMUNITY): Admission: EM | Admit: 2008-01-21 | Discharge: 2008-01-21 | Payer: Self-pay | Admitting: Emergency Medicine

## 2008-01-24 ENCOUNTER — Ambulatory Visit (HOSPITAL_COMMUNITY): Admission: RE | Admit: 2008-01-24 | Discharge: 2008-01-24 | Payer: Self-pay | Admitting: Internal Medicine

## 2008-01-26 ENCOUNTER — Telehealth: Payer: Self-pay | Admitting: Internal Medicine

## 2008-02-01 ENCOUNTER — Ambulatory Visit: Payer: Self-pay | Admitting: Internal Medicine

## 2008-02-02 ENCOUNTER — Telehealth: Payer: Self-pay | Admitting: Internal Medicine

## 2008-02-26 ENCOUNTER — Telehealth: Payer: Self-pay | Admitting: Internal Medicine

## 2008-03-05 ENCOUNTER — Telehealth: Payer: Self-pay | Admitting: Internal Medicine

## 2008-03-19 ENCOUNTER — Telehealth: Payer: Self-pay | Admitting: Internal Medicine

## 2008-04-13 ENCOUNTER — Emergency Department (HOSPITAL_COMMUNITY): Admission: EM | Admit: 2008-04-13 | Discharge: 2008-04-14 | Payer: Self-pay | Admitting: Family Medicine

## 2008-04-14 ENCOUNTER — Telehealth: Payer: Self-pay | Admitting: Family Medicine

## 2008-04-15 ENCOUNTER — Telehealth: Payer: Self-pay | Admitting: Internal Medicine

## 2008-04-23 ENCOUNTER — Telehealth: Payer: Self-pay | Admitting: Internal Medicine

## 2008-04-29 ENCOUNTER — Telehealth: Payer: Self-pay | Admitting: Internal Medicine

## 2008-05-10 ENCOUNTER — Encounter: Payer: Self-pay | Admitting: Internal Medicine

## 2008-05-15 ENCOUNTER — Telehealth: Payer: Self-pay | Admitting: *Deleted

## 2008-06-05 ENCOUNTER — Telehealth (INDEPENDENT_AMBULATORY_CARE_PROVIDER_SITE_OTHER): Payer: Self-pay | Admitting: *Deleted

## 2008-06-17 ENCOUNTER — Telehealth: Payer: Self-pay | Admitting: Internal Medicine

## 2008-07-09 ENCOUNTER — Encounter: Payer: Self-pay | Admitting: Internal Medicine

## 2008-07-17 ENCOUNTER — Telehealth: Payer: Self-pay | Admitting: Internal Medicine

## 2008-08-05 ENCOUNTER — Telehealth: Payer: Self-pay | Admitting: Internal Medicine

## 2008-08-07 ENCOUNTER — Ambulatory Visit: Payer: Self-pay | Admitting: Internal Medicine

## 2008-08-07 DIAGNOSIS — R630 Anorexia: Secondary | ICD-10-CM

## 2008-08-07 DIAGNOSIS — F17200 Nicotine dependence, unspecified, uncomplicated: Secondary | ICD-10-CM

## 2008-08-07 DIAGNOSIS — F410 Panic disorder [episodic paroxysmal anxiety] without agoraphobia: Secondary | ICD-10-CM | POA: Insufficient documentation

## 2008-08-07 HISTORY — DX: Anorexia: R63.0

## 2008-08-07 HISTORY — DX: Nicotine dependence, unspecified, uncomplicated: F17.200

## 2008-08-07 LAB — CONVERTED CEMR LAB
ALT: 15 units/L (ref 0–35)
AST: 19 units/L (ref 0–37)
Albumin: 4.2 g/dL (ref 3.5–5.2)
Alkaline Phosphatase: 62 units/L (ref 39–117)
Basophils Relative: 1.4 % (ref 0.0–3.0)
Eosinophils Relative: 3.7 % (ref 0.0–5.0)
Lymphocytes Relative: 38.2 % (ref 12.0–46.0)
Monocytes Relative: 16.2 % — ABNORMAL HIGH (ref 3.0–12.0)
Neutrophils Relative %: 40.5 % — ABNORMAL LOW (ref 43.0–77.0)
RBC: 4.29 M/uL (ref 3.87–5.11)
Total Protein: 6.4 g/dL (ref 6.0–8.3)
WBC: 6.2 10*3/uL (ref 4.5–10.5)

## 2008-08-08 ENCOUNTER — Encounter: Payer: Self-pay | Admitting: Internal Medicine

## 2008-08-09 ENCOUNTER — Telehealth: Payer: Self-pay | Admitting: Internal Medicine

## 2008-08-13 ENCOUNTER — Telehealth: Payer: Self-pay | Admitting: Internal Medicine

## 2008-08-21 ENCOUNTER — Ambulatory Visit: Payer: Self-pay | Admitting: Licensed Clinical Social Worker

## 2008-08-24 ENCOUNTER — Telehealth: Payer: Self-pay | Admitting: Family Medicine

## 2008-08-29 ENCOUNTER — Ambulatory Visit: Payer: Self-pay | Admitting: Licensed Clinical Social Worker

## 2008-09-05 ENCOUNTER — Ambulatory Visit: Payer: Self-pay | Admitting: Licensed Clinical Social Worker

## 2008-09-11 ENCOUNTER — Telehealth: Payer: Self-pay | Admitting: Internal Medicine

## 2008-11-07 ENCOUNTER — Telehealth: Payer: Self-pay | Admitting: Internal Medicine

## 2008-11-27 ENCOUNTER — Telehealth: Payer: Self-pay | Admitting: Family Medicine

## 2008-11-27 ENCOUNTER — Emergency Department (HOSPITAL_COMMUNITY): Admission: EM | Admit: 2008-11-27 | Discharge: 2008-11-28 | Payer: Self-pay | Admitting: Emergency Medicine

## 2008-12-05 ENCOUNTER — Telehealth: Payer: Self-pay | Admitting: Internal Medicine

## 2008-12-11 ENCOUNTER — Ambulatory Visit: Payer: Self-pay | Admitting: Internal Medicine

## 2008-12-11 DIAGNOSIS — M5412 Radiculopathy, cervical region: Secondary | ICD-10-CM

## 2008-12-11 HISTORY — DX: Radiculopathy, cervical region: M54.12

## 2008-12-20 ENCOUNTER — Telehealth: Payer: Self-pay | Admitting: Internal Medicine

## 2008-12-24 ENCOUNTER — Encounter: Payer: Self-pay | Admitting: Internal Medicine

## 2009-01-02 ENCOUNTER — Telehealth: Payer: Self-pay | Admitting: Internal Medicine

## 2009-01-08 ENCOUNTER — Encounter
Admission: RE | Admit: 2009-01-08 | Discharge: 2009-02-28 | Payer: Self-pay | Admitting: Physical Medicine & Rehabilitation

## 2009-01-09 ENCOUNTER — Ambulatory Visit: Payer: Self-pay | Admitting: Physical Medicine & Rehabilitation

## 2009-01-30 ENCOUNTER — Ambulatory Visit: Payer: Self-pay | Admitting: Internal Medicine

## 2009-02-13 ENCOUNTER — Encounter
Admission: RE | Admit: 2009-02-13 | Discharge: 2009-02-28 | Payer: Self-pay | Admitting: Physical Medicine & Rehabilitation

## 2009-02-14 ENCOUNTER — Telehealth: Payer: Self-pay | Admitting: Internal Medicine

## 2009-02-17 ENCOUNTER — Ambulatory Visit: Payer: Self-pay | Admitting: Physical Medicine & Rehabilitation

## 2009-02-17 ENCOUNTER — Telehealth: Payer: Self-pay | Admitting: Internal Medicine

## 2009-03-05 ENCOUNTER — Telehealth: Payer: Self-pay | Admitting: Internal Medicine

## 2009-03-05 ENCOUNTER — Ambulatory Visit: Payer: Self-pay | Admitting: Licensed Clinical Social Worker

## 2009-03-17 ENCOUNTER — Telehealth: Payer: Self-pay | Admitting: Internal Medicine

## 2009-03-18 ENCOUNTER — Emergency Department (HOSPITAL_COMMUNITY): Admission: EM | Admit: 2009-03-18 | Discharge: 2009-03-18 | Payer: Self-pay | Admitting: Emergency Medicine

## 2009-03-18 ENCOUNTER — Telehealth: Payer: Self-pay | Admitting: Internal Medicine

## 2009-03-19 ENCOUNTER — Ambulatory Visit: Payer: Self-pay | Admitting: Licensed Clinical Social Worker

## 2009-03-19 ENCOUNTER — Ambulatory Visit: Payer: Self-pay | Admitting: Internal Medicine

## 2009-03-19 DIAGNOSIS — K089 Disorder of teeth and supporting structures, unspecified: Secondary | ICD-10-CM | POA: Insufficient documentation

## 2009-04-14 ENCOUNTER — Ambulatory Visit: Payer: Self-pay | Admitting: Internal Medicine

## 2009-04-14 ENCOUNTER — Telehealth: Payer: Self-pay | Admitting: Internal Medicine

## 2009-04-14 DIAGNOSIS — J020 Streptococcal pharyngitis: Secondary | ICD-10-CM

## 2009-04-14 LAB — CONVERTED CEMR LAB: Rapid Strep: NEGATIVE

## 2009-04-23 ENCOUNTER — Ambulatory Visit: Payer: Self-pay | Admitting: Internal Medicine

## 2009-04-23 DIAGNOSIS — L819 Disorder of pigmentation, unspecified: Secondary | ICD-10-CM

## 2009-04-23 DIAGNOSIS — A630 Anogenital (venereal) warts: Secondary | ICD-10-CM

## 2009-04-23 DIAGNOSIS — L909 Atrophic disorder of skin, unspecified: Secondary | ICD-10-CM | POA: Insufficient documentation

## 2009-04-23 DIAGNOSIS — L919 Hypertrophic disorder of the skin, unspecified: Secondary | ICD-10-CM

## 2009-04-23 HISTORY — DX: Anogenital (venereal) warts: A63.0

## 2009-04-23 HISTORY — DX: Disorder of pigmentation, unspecified: L81.9

## 2009-05-01 ENCOUNTER — Telehealth (INDEPENDENT_AMBULATORY_CARE_PROVIDER_SITE_OTHER): Payer: Self-pay | Admitting: *Deleted

## 2009-05-07 ENCOUNTER — Telehealth (INDEPENDENT_AMBULATORY_CARE_PROVIDER_SITE_OTHER): Payer: Self-pay | Admitting: *Deleted

## 2009-05-19 ENCOUNTER — Telehealth: Payer: Self-pay | Admitting: Internal Medicine

## 2009-06-03 ENCOUNTER — Telehealth: Payer: Self-pay | Admitting: *Deleted

## 2009-06-20 ENCOUNTER — Ambulatory Visit: Payer: Self-pay | Admitting: Internal Medicine

## 2009-06-20 DIAGNOSIS — F341 Dysthymic disorder: Secondary | ICD-10-CM

## 2009-06-20 HISTORY — DX: Dysthymic disorder: F34.1

## 2009-06-25 ENCOUNTER — Telehealth: Payer: Self-pay | Admitting: Internal Medicine

## 2009-07-04 ENCOUNTER — Ambulatory Visit: Payer: Self-pay | Admitting: Family Medicine

## 2009-07-04 ENCOUNTER — Telehealth: Payer: Self-pay | Admitting: Family Medicine

## 2009-07-04 DIAGNOSIS — J019 Acute sinusitis, unspecified: Secondary | ICD-10-CM

## 2009-07-16 ENCOUNTER — Telehealth: Payer: Self-pay | Admitting: Internal Medicine

## 2009-08-05 ENCOUNTER — Telehealth: Payer: Self-pay | Admitting: Internal Medicine

## 2009-08-21 ENCOUNTER — Telehealth: Payer: Self-pay | Admitting: Internal Medicine

## 2009-08-26 ENCOUNTER — Ambulatory Visit: Payer: Self-pay | Admitting: Internal Medicine

## 2009-08-26 DIAGNOSIS — F4001 Agoraphobia with panic disorder: Secondary | ICD-10-CM | POA: Insufficient documentation

## 2009-08-27 ENCOUNTER — Telehealth: Payer: Self-pay | Admitting: Internal Medicine

## 2009-09-09 ENCOUNTER — Ambulatory Visit: Payer: Self-pay | Admitting: Internal Medicine

## 2009-09-09 DIAGNOSIS — R1310 Dysphagia, unspecified: Secondary | ICD-10-CM | POA: Insufficient documentation

## 2009-09-09 HISTORY — DX: Dysphagia, unspecified: R13.10

## 2009-09-10 ENCOUNTER — Telehealth: Payer: Self-pay | Admitting: Internal Medicine

## 2009-09-10 ENCOUNTER — Encounter (INDEPENDENT_AMBULATORY_CARE_PROVIDER_SITE_OTHER): Payer: Self-pay | Admitting: *Deleted

## 2009-09-16 ENCOUNTER — Telehealth: Payer: Self-pay | Admitting: Internal Medicine

## 2009-09-19 ENCOUNTER — Ambulatory Visit: Payer: Self-pay | Admitting: Internal Medicine

## 2009-09-19 DIAGNOSIS — R1319 Other dysphagia: Secondary | ICD-10-CM

## 2009-09-29 ENCOUNTER — Encounter: Admission: RE | Admit: 2009-09-29 | Discharge: 2009-09-29 | Payer: Self-pay | Admitting: Internal Medicine

## 2009-09-30 ENCOUNTER — Telehealth: Payer: Self-pay | Admitting: Internal Medicine

## 2009-10-08 ENCOUNTER — Telehealth: Payer: Self-pay | Admitting: Family Medicine

## 2009-10-09 ENCOUNTER — Encounter: Payer: Self-pay | Admitting: Internal Medicine

## 2009-10-09 ENCOUNTER — Ambulatory Visit: Payer: Self-pay | Admitting: Internal Medicine

## 2009-10-09 ENCOUNTER — Ambulatory Visit (HOSPITAL_COMMUNITY): Admission: RE | Admit: 2009-10-09 | Discharge: 2009-10-09 | Payer: Self-pay | Admitting: Internal Medicine

## 2009-10-10 ENCOUNTER — Telehealth: Payer: Self-pay | Admitting: Internal Medicine

## 2009-10-13 ENCOUNTER — Telehealth: Payer: Self-pay | Admitting: Internal Medicine

## 2009-10-17 ENCOUNTER — Telehealth: Payer: Self-pay | Admitting: Internal Medicine

## 2009-10-21 ENCOUNTER — Telehealth: Payer: Self-pay | Admitting: Internal Medicine

## 2009-10-23 ENCOUNTER — Ambulatory Visit: Payer: Self-pay | Admitting: Family Medicine

## 2009-10-23 DIAGNOSIS — K219 Gastro-esophageal reflux disease without esophagitis: Secondary | ICD-10-CM

## 2009-10-23 DIAGNOSIS — R002 Palpitations: Secondary | ICD-10-CM

## 2009-10-23 HISTORY — DX: Gastro-esophageal reflux disease without esophagitis: K21.9

## 2009-10-23 HISTORY — DX: Palpitations: R00.2

## 2009-11-07 ENCOUNTER — Telehealth: Payer: Self-pay | Admitting: *Deleted

## 2009-11-14 ENCOUNTER — Ambulatory Visit: Payer: Self-pay | Admitting: Family Medicine

## 2009-11-19 ENCOUNTER — Telehealth: Payer: Self-pay | Admitting: Internal Medicine

## 2009-11-19 ENCOUNTER — Inpatient Hospital Stay (HOSPITAL_COMMUNITY): Admission: AD | Admit: 2009-11-19 | Discharge: 2009-11-19 | Payer: Self-pay | Admitting: Obstetrics & Gynecology

## 2009-11-19 ENCOUNTER — Encounter: Payer: Self-pay | Admitting: Cardiology

## 2009-11-23 ENCOUNTER — Encounter: Payer: Self-pay | Admitting: Internal Medicine

## 2009-11-28 ENCOUNTER — Telehealth: Payer: Self-pay | Admitting: Internal Medicine

## 2009-12-04 ENCOUNTER — Telehealth: Payer: Self-pay | Admitting: Internal Medicine

## 2009-12-09 ENCOUNTER — Telehealth: Payer: Self-pay | Admitting: Internal Medicine

## 2009-12-16 ENCOUNTER — Ambulatory Visit: Payer: Self-pay | Admitting: Internal Medicine

## 2009-12-16 DIAGNOSIS — N809 Endometriosis, unspecified: Secondary | ICD-10-CM

## 2009-12-16 HISTORY — DX: Endometriosis, unspecified: N80.9

## 2009-12-19 ENCOUNTER — Telehealth: Payer: Self-pay | Admitting: Internal Medicine

## 2009-12-23 ENCOUNTER — Telehealth: Payer: Self-pay | Admitting: Internal Medicine

## 2009-12-25 ENCOUNTER — Telehealth: Payer: Self-pay | Admitting: Internal Medicine

## 2009-12-26 ENCOUNTER — Encounter: Payer: Self-pay | Admitting: Internal Medicine

## 2009-12-30 ENCOUNTER — Telehealth (INDEPENDENT_AMBULATORY_CARE_PROVIDER_SITE_OTHER): Payer: Self-pay | Admitting: *Deleted

## 2010-01-09 ENCOUNTER — Telehealth: Payer: Self-pay | Admitting: Internal Medicine

## 2010-01-12 ENCOUNTER — Telehealth: Payer: Self-pay | Admitting: Internal Medicine

## 2010-01-30 ENCOUNTER — Telehealth (INDEPENDENT_AMBULATORY_CARE_PROVIDER_SITE_OTHER): Payer: Self-pay | Admitting: *Deleted

## 2010-02-18 ENCOUNTER — Telehealth: Payer: Self-pay | Admitting: Internal Medicine

## 2010-02-24 ENCOUNTER — Telehealth: Payer: Self-pay | Admitting: Internal Medicine

## 2010-02-25 ENCOUNTER — Telehealth: Payer: Self-pay | Admitting: Internal Medicine

## 2010-02-27 ENCOUNTER — Ambulatory Visit: Payer: Self-pay | Admitting: Internal Medicine

## 2010-02-27 DIAGNOSIS — K439 Ventral hernia without obstruction or gangrene: Secondary | ICD-10-CM

## 2010-02-27 HISTORY — DX: Ventral hernia without obstruction or gangrene: K43.9

## 2010-02-27 LAB — CONVERTED CEMR LAB
Basophils Relative: 0 % (ref 0–1)
HCT: 42.9 % (ref 36.0–46.0)
Hemoglobin: 14.1 g/dL (ref 12.0–15.0)
MCHC: 32.9 g/dL (ref 30.0–36.0)
Monocytes Absolute: 0.5 10*3/uL (ref 0.1–1.0)
Monocytes Relative: 6 % (ref 3–12)
Neutro Abs: 5 10*3/uL (ref 1.7–7.7)
RBC: 4.43 M/uL (ref 3.87–5.11)

## 2010-03-03 ENCOUNTER — Ambulatory Visit: Payer: Self-pay | Admitting: Cardiology

## 2010-03-04 ENCOUNTER — Telehealth: Payer: Self-pay | Admitting: Internal Medicine

## 2010-03-10 ENCOUNTER — Telehealth: Payer: Self-pay | Admitting: Internal Medicine

## 2010-03-31 ENCOUNTER — Telehealth (INDEPENDENT_AMBULATORY_CARE_PROVIDER_SITE_OTHER): Payer: Self-pay | Admitting: *Deleted

## 2010-04-15 ENCOUNTER — Telehealth: Payer: Self-pay | Admitting: Internal Medicine

## 2010-04-17 ENCOUNTER — Telehealth: Payer: Self-pay | Admitting: Internal Medicine

## 2010-04-23 ENCOUNTER — Telehealth: Payer: Self-pay | Admitting: Internal Medicine

## 2010-05-05 ENCOUNTER — Telehealth: Payer: Self-pay | Admitting: Internal Medicine

## 2010-05-06 ENCOUNTER — Telehealth (INDEPENDENT_AMBULATORY_CARE_PROVIDER_SITE_OTHER): Payer: Self-pay | Admitting: *Deleted

## 2010-05-11 ENCOUNTER — Telehealth: Payer: Self-pay | Admitting: Gastroenterology

## 2010-05-13 ENCOUNTER — Ambulatory Visit: Payer: Self-pay | Admitting: Internal Medicine

## 2010-05-13 DIAGNOSIS — K222 Esophageal obstruction: Secondary | ICD-10-CM

## 2010-05-13 HISTORY — DX: Esophageal obstruction: K22.2

## 2010-05-15 ENCOUNTER — Telehealth: Payer: Self-pay | Admitting: Internal Medicine

## 2010-05-23 ENCOUNTER — Emergency Department (HOSPITAL_BASED_OUTPATIENT_CLINIC_OR_DEPARTMENT_OTHER): Admission: EM | Admit: 2010-05-23 | Discharge: 2010-05-24 | Payer: Self-pay | Admitting: Emergency Medicine

## 2010-05-24 ENCOUNTER — Telehealth: Payer: Self-pay | Admitting: Gastroenterology

## 2010-05-25 ENCOUNTER — Inpatient Hospital Stay (HOSPITAL_COMMUNITY): Admission: EM | Admit: 2010-05-25 | Discharge: 2010-05-25 | Payer: Self-pay | Admitting: Emergency Medicine

## 2010-05-25 ENCOUNTER — Ambulatory Visit: Payer: Self-pay | Admitting: Internal Medicine

## 2010-05-25 ENCOUNTER — Telehealth: Payer: Self-pay | Admitting: Internal Medicine

## 2010-05-26 ENCOUNTER — Telehealth: Payer: Self-pay | Admitting: Internal Medicine

## 2010-05-27 ENCOUNTER — Ambulatory Visit (HOSPITAL_COMMUNITY): Admission: RE | Admit: 2010-05-27 | Discharge: 2010-05-27 | Payer: Self-pay | Admitting: Internal Medicine

## 2010-05-27 ENCOUNTER — Encounter: Payer: Self-pay | Admitting: Internal Medicine

## 2010-05-27 ENCOUNTER — Telehealth: Payer: Self-pay | Admitting: Internal Medicine

## 2010-06-10 ENCOUNTER — Telehealth: Payer: Self-pay | Admitting: Internal Medicine

## 2010-06-11 ENCOUNTER — Ambulatory Visit: Payer: Self-pay | Admitting: Family Medicine

## 2010-06-11 DIAGNOSIS — B009 Herpesviral infection, unspecified: Secondary | ICD-10-CM

## 2010-06-11 DIAGNOSIS — N760 Acute vaginitis: Secondary | ICD-10-CM | POA: Insufficient documentation

## 2010-06-11 DIAGNOSIS — R5381 Other malaise: Secondary | ICD-10-CM

## 2010-06-11 DIAGNOSIS — R5383 Other fatigue: Secondary | ICD-10-CM | POA: Insufficient documentation

## 2010-06-11 DIAGNOSIS — R05 Cough: Secondary | ICD-10-CM

## 2010-06-11 HISTORY — DX: Other malaise: R53.81

## 2010-06-11 HISTORY — DX: Herpesviral infection, unspecified: B00.9

## 2010-06-11 LAB — CONVERTED CEMR LAB
Bilirubin Urine: NEGATIVE
Blood in Urine, dipstick: NEGATIVE
Glucose, Urine, Semiquant: NEGATIVE
WBC Urine, dipstick: NEGATIVE
pH: 8.5

## 2010-06-12 ENCOUNTER — Encounter: Payer: Self-pay | Admitting: Internal Medicine

## 2010-06-12 LAB — CONVERTED CEMR LAB
Albumin: 4.2 g/dL (ref 3.5–5.2)
Basophils Relative: 0.4 % (ref 0.0–3.0)
CO2: 30 meq/L (ref 19–32)
Calcium: 8.8 mg/dL (ref 8.4–10.5)
Candida species: NEGATIVE
Creatinine, Ser: 0.5 mg/dL (ref 0.4–1.2)
Eosinophils Relative: 5.6 % — ABNORMAL HIGH (ref 0.0–5.0)
Gardnerella vaginalis: POSITIVE — AB
Glucose, Bld: 70 mg/dL (ref 70–99)
HCT: 40.8 % (ref 36.0–46.0)
Hemoglobin: 14 g/dL (ref 12.0–15.0)
Lymphs Abs: 2.3 10*3/uL (ref 0.7–4.0)
Monocytes Relative: 6.3 % (ref 3.0–12.0)
Neutro Abs: 4.5 10*3/uL (ref 1.4–7.7)
Total Protein: 6.1 g/dL (ref 6.0–8.3)
WBC: 7.7 10*3/uL (ref 4.5–10.5)

## 2010-06-15 ENCOUNTER — Telehealth: Payer: Self-pay | Admitting: Internal Medicine

## 2010-06-16 ENCOUNTER — Emergency Department (HOSPITAL_COMMUNITY): Admission: EM | Admit: 2010-06-16 | Discharge: 2010-06-16 | Payer: Self-pay | Admitting: Emergency Medicine

## 2010-06-17 ENCOUNTER — Telehealth: Payer: Self-pay | Admitting: Internal Medicine

## 2010-06-17 DIAGNOSIS — J984 Other disorders of lung: Secondary | ICD-10-CM

## 2010-06-17 HISTORY — DX: Other disorders of lung: J98.4

## 2010-06-18 ENCOUNTER — Telehealth: Payer: Self-pay | Admitting: Internal Medicine

## 2010-06-18 ENCOUNTER — Ambulatory Visit: Payer: Self-pay | Admitting: Cardiovascular Disease

## 2010-06-19 ENCOUNTER — Ambulatory Visit: Payer: Self-pay | Admitting: Internal Medicine

## 2010-06-23 ENCOUNTER — Telehealth: Payer: Self-pay | Admitting: Internal Medicine

## 2010-06-25 ENCOUNTER — Telehealth: Payer: Self-pay | Admitting: Internal Medicine

## 2010-07-28 ENCOUNTER — Telehealth: Payer: Self-pay | Admitting: Internal Medicine

## 2010-08-05 ENCOUNTER — Telehealth: Payer: Self-pay | Admitting: Internal Medicine

## 2010-08-12 ENCOUNTER — Telehealth: Payer: Self-pay | Admitting: Internal Medicine

## 2010-09-03 ENCOUNTER — Telehealth: Payer: Self-pay | Admitting: Internal Medicine

## 2010-09-10 ENCOUNTER — Telehealth (INDEPENDENT_AMBULATORY_CARE_PROVIDER_SITE_OTHER): Payer: Self-pay | Admitting: *Deleted

## 2010-09-11 ENCOUNTER — Telehealth: Payer: Self-pay | Admitting: Internal Medicine

## 2010-09-15 ENCOUNTER — Telehealth: Payer: Self-pay | Admitting: Nurse Practitioner

## 2010-09-15 ENCOUNTER — Ambulatory Visit: Payer: Self-pay | Admitting: Gastroenterology

## 2010-09-15 DIAGNOSIS — K5900 Constipation, unspecified: Secondary | ICD-10-CM | POA: Insufficient documentation

## 2010-09-15 DIAGNOSIS — F418 Other specified anxiety disorders: Secondary | ICD-10-CM

## 2010-09-15 DIAGNOSIS — F411 Generalized anxiety disorder: Secondary | ICD-10-CM

## 2010-09-15 HISTORY — DX: Constipation, unspecified: K59.00

## 2010-09-15 HISTORY — DX: Generalized anxiety disorder: F41.1

## 2010-09-23 ENCOUNTER — Telehealth: Payer: Self-pay | Admitting: Internal Medicine

## 2010-10-05 ENCOUNTER — Ambulatory Visit: Payer: Self-pay | Admitting: Internal Medicine

## 2010-10-12 ENCOUNTER — Ambulatory Visit: Payer: Self-pay | Admitting: Internal Medicine

## 2010-10-12 ENCOUNTER — Telehealth: Payer: Self-pay | Admitting: Internal Medicine

## 2010-10-13 ENCOUNTER — Telehealth: Payer: Self-pay | Admitting: Internal Medicine

## 2010-11-03 ENCOUNTER — Telehealth (INDEPENDENT_AMBULATORY_CARE_PROVIDER_SITE_OTHER): Payer: Self-pay | Admitting: *Deleted

## 2010-11-03 ENCOUNTER — Encounter: Payer: Self-pay | Admitting: Internal Medicine

## 2010-11-03 ENCOUNTER — Telehealth: Payer: Self-pay | Admitting: Internal Medicine

## 2010-11-03 ENCOUNTER — Ambulatory Visit
Admission: RE | Admit: 2010-11-03 | Discharge: 2010-11-03 | Payer: Self-pay | Source: Home / Self Care | Attending: Internal Medicine | Admitting: Internal Medicine

## 2010-11-05 ENCOUNTER — Telehealth (INDEPENDENT_AMBULATORY_CARE_PROVIDER_SITE_OTHER): Payer: Self-pay | Admitting: *Deleted

## 2010-11-05 ENCOUNTER — Emergency Department (HOSPITAL_COMMUNITY)
Admission: EM | Admit: 2010-11-05 | Discharge: 2010-11-05 | Payer: Self-pay | Source: Home / Self Care | Admitting: Emergency Medicine

## 2010-11-06 ENCOUNTER — Telehealth: Payer: Self-pay | Admitting: Internal Medicine

## 2010-11-21 ENCOUNTER — Encounter: Payer: Self-pay | Admitting: Family Medicine

## 2010-11-22 ENCOUNTER — Encounter: Payer: Self-pay | Admitting: General Surgery

## 2010-11-24 ENCOUNTER — Telehealth: Payer: Self-pay | Admitting: Internal Medicine

## 2010-11-26 ENCOUNTER — Ambulatory Visit
Admission: RE | Admit: 2010-11-26 | Discharge: 2010-11-26 | Payer: Self-pay | Source: Home / Self Care | Attending: Family Medicine | Admitting: Family Medicine

## 2010-11-26 ENCOUNTER — Other Ambulatory Visit: Payer: Self-pay | Admitting: Family Medicine

## 2010-11-30 ENCOUNTER — Telehealth: Payer: Self-pay | Admitting: Internal Medicine

## 2010-12-03 NOTE — Procedures (Signed)
Summary: summary report  summary report   Imported By: Mirna Mires 11/19/2009 11:32:07  _____________________________________________________________________  External Attachment:    Type:   Image     Comment:   External Document

## 2010-12-03 NOTE — Progress Notes (Signed)
Summary: tax purposes  Phone Note Call from Patient Call back at Home Phone [phone]   Caller: Patient Call For: Stacie Glaze MD Summary of Call: Pt spent 1800  last yr on a  mattress she needs documentation that is was medically necessary for back pain, for tax purposes.  Initial call taken by: Heron Sabins,  December 25, 2009 12:57 PM  Follow-up for Phone Call        The IRS dose not look kindly on "after the fact" doc notes for purchases. in the future she should atticipate the need for a note "prior" to the purchase. We can only produce a note that she has a history of back pain and that a firm mattress is reccomended Triage may produce but it has to be dated today Follow-up by: Stacie Glaze MD,  December 26, 2009 9:28 AM  Additional Follow-up for Phone Call Additional follow up Details #1::        Pt. notified to pick up letter. Additional Follow-up by: Lynann Beaver CMA,  December 26, 2009 10:12 AM

## 2010-12-03 NOTE — Letter (Signed)
Summary: Needs Firm Mattress  Needs Firm Mattress   Imported By: Maryln Gottron 01/08/2010 14:05:21  _____________________________________________________________________  External Attachment:    Type:   Image     Comment:   External Document

## 2010-12-03 NOTE — Procedures (Signed)
Summary: Prep/[facility]  Prep/[facility]   Imported By: Lester [place] 05/18/2010 08:34:46  _____________________________________________________________________  External Attachment:    Type:   Image     Comment:   External Document

## 2010-12-03 NOTE — Progress Notes (Signed)
Summary: Pt having severe pain near belly button. Req ov Dr Lovell Sheehan only  Phone Note Call from Patient Call back at Home Phone [phone]   Caller: Patient Reason for Call: Acute Illness Summary of Call: Pt called and said that she is having severe pain near belly button. Pt says that it feels like something is tearing inside. Pt wants to see Dr. Lovell Sheehan asap tomorrow.     Initial call taken by: Lucy Antigua,  February 24, 2010 10:44 AM  Follow-up for Phone Call        per dr Lovell Sheehan- if she feels like something is tearing on the inside, she needs to see the surgeon Follow-up by: Willy Eddy, LPN,  February 25, 2010 11:14 AM  Additional Follow-up for Phone Call Additional follow up Details #1::        Phone Call Completed Additional Follow-up by: Rudy Jew, RN,  February 25, 2010 12:09 PM

## 2010-12-03 NOTE — Progress Notes (Signed)
Summary: Questions about meds  Phone Note Call from Patient Call back at Home Phone [phone]   Caller: Patient Call For: Willette Cluster Reason for Call: Talk to Nurse Summary of Call: Has some questions about her meds that was prescribed today Initial call taken by: Karna Christmas,  September 15, 2010 3:32 PM  Follow-up for Phone Call        Called pt and LM for her to call me back.   Follow-up by: Joselyn Glassman,  September 15, 2010 3:54 PM  Additional Follow-up for Phone Call Additional follow up Details #1::        Reviewed directions with her on the Flovent .  She is to take 2 sprays and swallow. Then rinse her mouth out with water but don't swallow the water.  NPO for 30 min after susing the spray. Additional Follow-up by: Joselyn Glassman,  September 15, 2010 4:29 PM     Appended Document: Questions about meds Spoke to the pt and she is taking the Prevacid Solutabs daily.  I asked how was doing with the Fluticasone ( Flovent) spray and she hasn't used it because she is worried due to the pharmacist telling her not to swallow it.  I reviewed the instructions and let her know that several of our other MD's also prescribe it like Paula did.  She said she might still use it but she explained the anxiety issues with herself.

## 2010-12-03 NOTE — Progress Notes (Signed)
Summary: stomach  Phone Note Call from Patient Call back at Home Phone [phone]   Reason for Call: Acute Illness Summary of Call: Her surgeon will not see her anymore & she cannot get appointment with any other surgeon because of liability issues, mesh x3.  Wants appointment with Dr. Shela Commons ASAP for stomach hurting left side to belly button. Feels it, like a flush, when she eats or drinks.  Area is slightly raised.  No fever.  Couple of years ago had belly button ring.  Lately some pus & a couple little black things have come out where ring was.  The pulling, achy, tearing feeling worse last few days.   Not an OB Gyn problem.  Does not want to go to ER.  Asks that somebody call her back today if possible.    Initial call taken by: Rudy Jew, RN,  February 25, 2010 4:37 PM  Follow-up for Phone Call        ov given for friday at 3pm and pt notified Follow-up by: Willy Eddy, LPN,  February 25, 2010 5:12 PM

## 2010-12-03 NOTE — Progress Notes (Signed)
  Phone Note Call from Patient Call back at Home Phone [phone]   Caller: Patient Call For: Stacie Glaze MD Reason for Call: Acute Illness Complaint: Abdominal Pain Summary of Call: extreme soreness in arm, abdominal pain, back pain, feels completely drained, had surgery Wednesday, picked up a child that weighs 20 lbs. went to the ER last night...Marland KitchenMarland KitchenMarland Kitchenjust left...without being seen.  Had esophageal rings Wed.......basically was on liquid diet, and starting some solid food.  Is going through a "stop smoking" program.   Thinks she pulled a muscle picking up a child.  Dull chest tightness.  With all her chronic problems, she is just feeling terrible.  EMTs said BP was 158/90 last night. Wants to see Dr.  Lovell Sheehan or some advice.  Per Dr. Lovell Sheehan , direct [facility].  She agrees to go and phone  number given to pt. Initial call taken by: Lynann Beaver CMA AAMA,  November 06, 2010 3:53 PM  Follow-up for Phone Call        pt aware Follow-up by: Willy Eddy, LPN,  November 06, 2010 4:20 PM

## 2010-12-03 NOTE — Progress Notes (Signed)
Summary: leg pain and numbness.  Phone Note Call from Patient   Caller: Patient Call For: Stacie Glaze MD Summary of Call: Pt has been having muscle spasms in her left leg from hip to knee is numb x 2-3 days Took Oxycodone and pain is a little better. Is dragging her leg. [phone]  Initial call taken by: Lynann Beaver CMA,  December 23, 2009 9:55 AM  Follow-up for Phone Call        dragging due to muscle spasm?  recommend magnesium 250 two times a day for 5 days moist heat to spasm Follow-up by: Stacie Glaze MD,  December 23, 2009 1:51 PM  Additional Follow-up for Phone Call Additional follow up Details #1::        Left message on pt's personal voice mail. Additional Follow-up by: Lynann Beaver CMA,  December 23, 2009 2:34 PM

## 2010-12-03 NOTE — Progress Notes (Signed)
  Phone Note Call from Patient   Summary of Call: has been having solid food dysphagia the past month or so.  liquids go down fine.  Starting to have raspy voice and trouble breathing when she lays flat.  I told her that esophageal problems should not really cause breathing difficulty, voice changes and that she should go to ER if these worsen or become more concerning.  If not, she will call GI office in AM to try to be seen sooner than her wednesday appt with Dr. Marina Goodell. Initial call taken by: Rachael Fee MD,  May 11, 2010 12:52 AM
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
## 2010-12-03 NOTE — Progress Notes (Signed)
Summary: Rx Refill  Phone Note Refill Request Call back at Home Phone [phone] Message from:  Patient on August 12, 2010 9:26 AM  Refills Requested: Medication #1:  OXYCODONE-ACETAMINOPHEN 10-325 MG TABS one by mouth q 4 hours as needed not to exceed 5 a day  Method Requested: Pick up at Office Initial call taken by: Trixie Dredge,  August 12, 2010 9:25 AM    Prescriptions: OXYCODONE-ACETAMINOPHEN 10-325 MG TABS (OXYCODONE-ACETAMINOPHEN) one by mouth q 4 hours as needed not to exceed 5 a day  #100 x 0   Entered by:   Willy Eddy, LPN   Authorized by:   Stacie Glaze MD   Signed by:   Willy Eddy, LPN on 09/81/1914   Method used:   Print then Give to Patient   RxID:   [id number]

## 2010-12-03 NOTE — Progress Notes (Signed)
Summary: dysphagia  Phone Note Other Incoming   Summary of Call: Pt's. mother [name] came for pre-visit and requested to see me.Asked me to relay that after talking to daughter she said prior dil. did help pt.Had 2 choking episodes over the week end so is remaining on liquids till ov. next week.Advised she could use full liquids and soft foods but needs to avoid rice and breads and to eat sm. amt's and chew slowly.. Initial call taken by: Teryl Lucy RN,  May 06, 2010 3:07 PM  Follow-up for Phone Call        agree Follow-up by: Hilarie Fredrickson MD,  May 06, 2010 3:27 PM

## 2010-12-03 NOTE — Letter (Signed)
Summary: EGD Instructions  [facility]  [address] [place], [place] [address]   Phone: [phone]  Fax: [phone]       LASHAUNDRA LEHRMANN    December 02, 1975    MRN: [id number]       Procedure Day /Date:TUESDAY 11/03/10     Arrival Time: 1:00 PM     Procedure Time:2:00 PM     Location of Procedure:                    X [facility] (4th Floor)  PREPARATION FOR ENDOSCOPY W/ DIL.   On TUESDAY 1/3/12THE DAY OF THE PROCEDURE:  1.   No solid foods, milk or milk products are allowed after midnight the night before your procedure.  2.   Do not drink anything colored red or purple.  Avoid juices with pulp.  No orange juice.  3.  You may drink clear liquids until 12 NOON which is 2 hours before your procedure.                                                                                                CLEAR LIQUIDS INCLUDE: Water Jello Ice Popsicles Tea (sugar ok, no milk/cream) Powdered fruit flavored drinks Coffee (sugar ok, no milk/cream) Gatorade Juice: apple, white grape, white cranberry  Lemonade Clear bullion, consomm, broth Carbonated beverages (any kind) Strained chicken noodle soup Hard Candy   MEDICATION INSTRUCTIONS  Unless otherwise instructed, you should take regular prescription medications with a small sip of water as early as possible the morning of your procedure.         OTHER INSTRUCTIONS  You will need a responsible adult at least 33 years of age to accompany you and drive you home.   This person must remain in the waiting room during your procedure.  Wear loose fitting clothing that is easily removed.  Leave jewelry and other valuables at home.  However, you may wish to bring a book to read or an iPod/MP3 player to listen to music as you wait for your procedure to start.  Remove all body piercing jewelry and leave at home.  Total time from sign-in until discharge is approximately 2-3 hours.  You should go home  directly after your procedure and rest.  You can resume normal activities the day after your procedure.  The day of your procedure you should not:   Drive   Make legal decisions   Operate machinery   Drink alcohol   Return to work  You will receive specific instructions about eating, activities and medications before you leave.    The above instructions have been reviewed and explained to me by   _______________________    I fully understand and can verbalize these instructions _____________________________ Date _________

## 2010-12-03 NOTE — Progress Notes (Signed)
Summary: new rx  Phone Note Call from Patient Call back at Home Phone [phone] Call back at [phone] scott   Caller: Patient Call For: Stacie Glaze MD Summary of Call: Pt needs new rx oxycodone  Initial call taken by: Heron Sabins,  May 27, 2010 9:28 AM    Prescriptions: OXYCODONE-ACETAMINOPHEN 10-325 MG TABS (OXYCODONE-ACETAMINOPHEN) one by mouth q 4 hours as needed not to exceed 5 a day  #100 x 0   Entered by:   Willy Eddy, LPN   Authorized by:   Stacie Glaze MD   Signed by:   Willy Eddy, LPN on 32/95/1884   Method used:   Print then Give to Patient   RxID:   [id number]

## 2010-12-03 NOTE — Assessment & Plan Note (Signed)
Summary: dysphagia   History of Present Illness Visit Type: Follow-up Visit Primary GI MD: [name] MD Primary Provider: [name], MD  Requesting Provider: na Chief Complaint: Dysphagia with solids and patient swallows food gets stuck History of Present Illness:   35 year old white female with significant psychiatric issues including bipolar disorder, anxiety disorder, and eating disorder. She also carries a diagnosis of your double bowel syndrome for which she has been previously evaluated by [facility]. I saw her for the first time in November regarding dysphagia and loss of appetite. See that dictation. Under propofol sedation, she underwent upper endoscopy. She was found to have multiple concentric rings in the esophagus. The examination was otherwise normal. She was dilated with a 28 [place] and then 50 [place] Maloney dilator without resistance. Relook endoscopy revealed a superficial laceration only. Post procedure she had some issues with anxiety but in time recognized significant improvement in swallowing. This improvement persisted until one month ago when she began to notice recurrent dysphagia to solid foods. She continues to struggle with anxiety issues. She reports being compliant with Prevacid solutab 30 mg daily.   GI Review of Systems    Reports dysphagia with solids.      Denies abdominal pain, acid reflux, belching, bloating, chest pain, dysphagia with liquids, heartburn, loss of appetite, nausea, vomiting, vomiting blood, weight loss, and  weight gain.        Denies anal fissure, black tarry stools, change in bowel habit, constipation, diarrhea, diverticulosis, fecal incontinence, heme positive stool, hemorrhoids, irritable bowel syndrome, jaundice, light color stool, liver problems, rectal bleeding, and  rectal pain.    Current Medications (verified): 1)  Alprazolam 0.5 Mg Tabs (Alprazolam) .... One Tablet By Mouth Six  Times A Day 2)  Lexapro 20 Mg Tabs  (Escitalopram Oxalate) .... One Tablet By Mouth Once Daily 3)  Oxycodone-Acetaminophen 10-325 Mg Tabs (Oxycodone-Acetaminophen) .... One By Mouth Q 4 Hours As Needed Not To Exceed 5 A Day 4)  Prevacid Solutab 30 Mg Tbdp (Lansoprazole) .... One By Mouth Once Daily As Needed  Allergies (verified): 1)  ! Zofran (Ondansetron Hcl) 2)  ! Nicoderm Cq (Nicotine) 3)  ! * Lomictal 4)  Chantix (Varenicline Tartrate)  Past History:  Past Medical History: Reviewed history from 09/19/2009 and no changes required. h/o hives Allergic rhinitis bipolar Anxiety Disorder Arrhythmia Arthritis Chronic Headaches Irritable Bowel Syndrome Urinary Tract Infection  Past Surgical History: Reviewed history from 09/19/2009 and no changes required. c-section, repeat - 09/01/2001 Hernia Surgery x 3 Hysterectomy Endome  Family History: Reviewed history from 09/19/2009 and no changes required. Father with first MI at 57, mother with MV prolapse, otherwise negative No FH of Colon Cancer: Family History of Liver Cancer:MGGF Family History of Colon Polyps:Father and MGM Family History of Diabetes: Father, MGM, and MGF Family History of Heart Disease: MGGM Family History of Irritable Bowel Syndrome:Mother   Social History: Reviewed history from 09/19/2009 and no changes required. Lives with her husband, [name], and her 11 yo daughter, [name]. 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  Alcohol Use - no Daily Caffeine Use: 4-5 daily  Illicit Drug Use - no  Review of Systems  The patient denies allergy/sinus, anemia, anxiety-new, arthritis/joint pain, back pain, blood in urine, breast changes/lumps, change in vision, confusion, cough, coughing up blood, depression-new, fainting, fatigue, fever, headaches-new, hearing problems, heart murmur, heart rhythm changes, itching, menstrual pain, muscle pains/cramps, night sweats, nosebleeds, pregnancy symptoms, shortness of breath, skin rash, sleeping problems,  sore throat, swelling of  feet/legs, swollen lymph glands, thirst - excessive , urination - excessive , urination changes/pain, urine leakage, vision changes, and voice change.    Vital Signs:  Patient profile:   35 year old female Height:      67 inches Weight:      142 pounds BMI:     22.32 BSA:     1.75 Pulse rate:   76 / minute Pulse rhythm:   regular BP sitting:   98 / 60  (left arm) Cuff size:   regular  Vitals Entered By: Ok Anis CMA (May 13, 2010 8:51 AM)  Physical Exam  General:  Well developed, well nourished, no acute distress. Head:  Normocephalic and atraumatic. Eyes:  PERRLA, no icterus. Ears:  Normal auditory acuity. Nose:  No deformity, discharge,  or lesions. Mouth:  No deformity or lesions, dentition normal. Neck:  Supple; no masses or thyromegaly. Lungs:  Clear throughout to auscultation. Heart:  Regular rate and rhythm; no murmurs, rubs,  or bruits. Abdomen:  Soft, nontender and nondistended. No masses, hepatosplenomegaly or hernias noted. Normal bowel sounds. Pulses:  Normal pulses noted. Extremities:  no edema Neurologic:  alert and oriented Skin:  no jaundice Psych:  Alert and cooperative. Anxious. 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 Good response to previous dilation. On PPI acid suppression therapy. Ongoing issues with anxiety and depression.  Plan: #1. Upper endoscopy with esophageal dilation. The nature procedure as well as risks, benefits, and alternatives were reviewed. She understood and agreed to proceed. #2. Perform the exam with propofol under MAC #3. Stop smoking #4. Continue lansoprazole  Other Orders: ZENDO with Sav. Dil (ZENDO/Sav Dil.)  Patient Instructions: 1)  EGD [facility] with dilt. Propoful 05/28/10 1:15 pm arrive at 11:15 am 2)  Nothing to eat or drink after  midnight. 3)  Upper Endoscopy brochure given.  4)  The medication list was reviewed and reconciled.  All changed / newly prescribed medications were explained.  A complete medication list was provided to the patient / caregiver.

## 2010-12-03 NOTE — Progress Notes (Signed)
Summary: new rx  Phone Note Refill Request Call back at Home Phone [phone] Message from:  Patient  Refills Requested: Medication #1:  OXYCODONE-ACETAMINOPHEN 10-325 MG TABS one by mouth q 4 hours as needed not to exceed 5 a day pt will need rx today   Initial call taken by: Heron Sabins,  September 23, 2010 12:53 PM    Prescriptions: OXYCODONE-ACETAMINOPHEN 10-325 MG TABS (OXYCODONE-ACETAMINOPHEN) one by mouth q 4 hours as needed not to exceed 5 a day  #100 x 0   Entered by:   Willy Eddy, LPN   Authorized by:   Stacie Glaze MD   Signed by:   Willy Eddy, LPN on 96/29/5284   Method used:   Print then Give to Patient   RxID:   [id number]

## 2010-12-03 NOTE — Progress Notes (Signed)
Summary: Pt req refill of Xanax and also needs referral for psychiatrist  Phone Note From Pharmacy Call back at [facility] Phone [phone]  on April 23, 2010 2:58 PM  Refills Requested: Medication #1:  ALPRAZOLAM 0.5 MG TABS one tablet by mouth five times a day Pt is going to run out of xanax before Monday 04/27/10. Pt is also needing  a referral to another psychiatrist. She no longer has psychiatrist.    Method Requested: Telephone to Pharmacy [facility] Initial call taken by: Lucy Antigua,  April 23, 2010 2:58 PM  Follow-up for Phone Call        may refer to psychiatrist have triage nurse give 10  xanax to last until monday Follow-up by: Stacie Glaze MD,  April 23, 2010 4:22 PM  Additional Follow-up for Phone Call Additional follow up Details #1::        Rx called to pharmacy.  patient is aware and states that she is taking 6 tabs a day and is afraid she will run out.  I explained that dr Lovell Sheehan was out of the office until Monday but that we would refer her to a psychiatrist.  only 10 tabs called in. Additional Follow-up by: Kern Reap CMA Duncan Dull),  April 23, 2010 6:05 PM

## 2010-12-03 NOTE — Progress Notes (Signed)
Summary: Pt req direct admit to [facility]   Phone Note Call from Patient Call back at [facility] Phone [phone]   Caller: Patient Summary of Call: Pt having surgery  on Wednesday re: swallowing problems. Pt is needing to be re-admitted back to hospital. Pt does not want to go through ER and was wondering if Dr. Lovell Sheehan can order a direct admit to [facility]. Pls call asap.     Initial call taken by: Lucy Antigua,  May 25, 2010 4:31 PM  Follow-up for Phone Call        pt states dr Marina Goodell was going to do her surgery and she was informed he would be the one to admit her- dr Lovell Sheehan cant admit to hosptial- he has hosptialist that does that Follow-up by: Willy Eddy, LPN,  May 25, 2010 5:03 PM

## 2010-12-03 NOTE — Progress Notes (Signed)
Summary: Pt req script for Oxycodone. Pt almost out  Phone Note Refill Request Call back at Home Phone [phone] Message from:  Patient on October 13, 2010 10:48 AM  Refills Requested: Medication #1:  OXYCODONE-ACETAMINOPHEN 10-325 MG TABS one by mouth q 4 hours as needed not to exceed 5 a day   Dosage confirmed as above?Dosage Confirmed  Method Requested: Pick up at Office Initial call taken by: Lucy Antigua,  October 13, 2010 10:47 AM

## 2010-12-03 NOTE — Assessment & Plan Note (Signed)
Summary: vaginal infection/dm   Vital Signs:  Patient profile:   35 year old female Height:      67 inches (170.18 cm) O2 Sat:      98 % on Room air Temp:     98.2 degrees F (36.78 degrees C) oral Pulse rate:   97 / minute BP sitting:   110 / 70  (left arm) Cuff size:   regular  Vitals Entered By: Josph Macho RMA (June 11, 2010 11:31 AM)  O2 Flow:  Room air CC: Vagina Infection/ Stomach hurting/ CF Is Patient Diabetic? No   History of Present Illness: Patient in for multiple complaints. H/O endometriosis and ovarian cysts, Sunday had bad cramps on this past Sunday. No obvious fevers/chills but some mild f/c. Not constant pain but comes and goes. Usually when she gets an ovarian cyst they can be off and on pain this is lasting longer than usual. Using her prescription pain meds with only partial relief. s/p Hysterectomy 7 years ago. Is sexually active with her husband who just returned from [place] with URI symptoms but no other complaints.  Yesterday she began with HA/head congestion b/l frontal pressure. Cough nonproductive mostly, PND is  most productive in am occasionally yellow. 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 Her current cold sore has been present for about 5 days now and it is getting bigger instead of better 2 weeks ago underwent esophageal dilatation was having severe dysphaghia with dehydration. Can now swallow and eat but continues to have a poor appetite. She continues to smoke 1 1/2 ppd. Denies any CP/palp/SOB. She denies dysuria and hematuria but des note some hesitancy   Current Medications (verified): 1)  Alprazolam 0.5 Mg Tabs (Alprazolam) .... One Tablet By Mouth Six  Times A Day 2)  Lexapro 20 Mg Tabs (Escitalopram Oxalate) .... One Tablet By Mouth Once Daily 3)  Oxycodone-Acetaminophen 10-325 Mg Tabs (Oxycodone-Acetaminophen) .... One By Mouth Q 4 Hours As Needed Not To Exceed 5 A Day 4)  Prevacid Solutab 30 Mg Tbdp (Lansoprazole)  .... One By Mouth Once Daily As Needed 5)  Zovirax 5 % Crea (Acyclovir) .... Apply Three Times A Day To Affected  Allergies (verified): 1)  ! Zofran (Ondansetron Hcl) 2)  ! Nicoderm Cq (Nicotine) 3)  ! * Lomictal 4)  Chantix (Varenicline Tartrate)  Past History:  Past medical history reviewed for relevance to current acute and chronic problems. Social history (including risk factors) reviewed for relevance to current acute and chronic problems.  Past Medical History: Reviewed history from 09/19/2009 and no changes required. h/o hives Allergic rhinitis bipolar Anxiety Disorder Arrhythmia Arthritis Chronic Headaches Irritable Bowel Syndrome Urinary Tract Infection  Social History: Reviewed history from 09/19/2009 and no changes required. Lives with her husband, [name], and her 1 yo daughter, [name]. 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  Alcohol Use - no Daily Caffeine Use: 4-5 daily  Illicit Drug Use - no  Review of Systems      See HPI  Physical Exam  General:  Well-developed,well-nourished,in no acute distress; alert,appropriate and cooperative throughout examination Head:  Normocephalic and atraumatic without obvious abnormalities. No apparent alopecia or balding. Ears:  External ear exam shows no significant lesions or deformities.  Otoscopic examination reveals clear canals, tympanic membranes are intact bilaterally without bulging, retraction, inflammation or discharge. Hearing is grossly normal bilaterally. Nose:  External nasal examination shows no deformity or inflammation. Nasal mucosa are pink and moist without lesions or exudates. Mouth:  Mild  erythema of posterior oropharynx, dry coated tongue Neck:  No deformities, masses, or tenderness noted. Lungs:  Normal respiratory effort, chest expands symmetrically. Lungs are clear to auscultation, no crackles or wheezes. Heart:  Normal rate and regular rhythm. S1 and S2 normal without gallop, murmur, click,  rub or other extra sounds. Abdomen:  Bowel sounds positive,abdomen soft and non-tender without masses, organomegaly or hernias noted. Genitalia:  Normal introitus for age, no external lesions, mucosa pink and moist, no vaginal lesions, no vaginal atrophy, no friaility or hemorrhage, normal uterus size and position, no adnexal masses or tenderness. Thick white vaginal discharge noted Extremities:  No clubbing, cyanosis, edema, or deformity noted  Psych:  Anxious   Impression & Recommendations:  Problem # 1:  VAGINITIS (ICD-616.10)  Her updated medication list for this problem includes:    Azithromycin 250 Mg Tabs (Azithromycin) .Marland Kitchen... 2 tabs by mouth once and then 1 tab by mouth once daily x 4d  Orders: T-Wet Prep for Christoper Allegra, Clue Cells ([phone]) T-Urine Culture (Spectrum Order) [id number]) Specimen Handling (32440) Venipuncture (10272) Exam c/w yeast, Diflucan sent in for use and encouraged to start a daily probiotic and avoid harsh soaps  Problem # 2:  DYSPHAGIA UNSPECIFIED (ICD-787.20) Improved will avoid offending foods and f/u with GI  Problem # 3:  ACUTE SINUSITIS, UNSPECIFIED (ICD-461.9)  Her updated medication list for this problem includes:    Azithromycin 250 Mg Tabs (Azithromycin) .Marland Kitchen... 2 tabs by mouth once and then 1 tab by mouth once daily x 4d  Orders: TLB-CBC Platelet - w/Differential (85025-CBCD) TLB-BMP (Basic Metabolic Panel-BMET) (80048-METABOL) TLB-Hepatic/Liver Function Pnl (80076-HEPATIC) TLB-Sedimentation Rate (ESR) (85652-ESR) Increase hydration due to mild dehydration and increase rest  Complete Medication List: 1)  Alprazolam 0.5 Mg Tabs (Alprazolam) .... One tablet by mouth six  times a day 2)  Lexapro 20 Mg Tabs (Escitalopram oxalate) .... One tablet by mouth once daily 3)  Oxycodone-acetaminophen 10-325 Mg Tabs (Oxycodone-acetaminophen) .... One by mouth q 4 hours as needed not to exceed 5 a day 4)  Prevacid Solutab 30 Mg Tbdp  (Lansoprazole) .... One by mouth once daily as needed 5)  Zovirax 5 % Crea (Acyclovir) .... Apply three times a day to affected 6)  Azithromycin 250 Mg Tabs (Azithromycin) .... 2 tabs by mouth once and then 1 tab by mouth once daily x 4d 7)  Diflucan 150 Mg Tabs (Fluconazole) .Marland Kitchen.. 1 tab by mouth q wk x 3 wk 8)  Zovirax 400 Mg Tabs (Acyclovir) .Marland Kitchen.. 1 tab by mouth 5 x daily x 7 days  Patient Instructions: 1)  Please schedule a follow-up appointment as needed  if symptoms worsen or do not improve 2)  Take your antibiotic as prescribed until ALL of it is gone, but stop if you develop a rash or swelling and contact our office as soon as possible.  3)  Acute sinusitis symptoms for less than 10 days are not helped by antibiotics. Use warm moist compresses, and over the counter decongestants( only as directed). Call if no improvement in 5-7 days, sooner if increasing pain, fever, or new symptoms.  4)  Take Align probiotic daily for next month Prescriptions: ZOVIRAX 400 MG TABS (ACYCLOVIR) 1 tab by mouth 5 x daily x 7 days  #35 x 0   Entered and Authorized by:   Danise Edge MD   Signed by:   Danise Edge MD on 06/11/2010   Method used:   Electronically to        [facility]  [address] [phone]* (retail)       [phone]       [place], [place]  [address]       Ph: [phone]       Fax: [phone]   RxID:   [id number] DIFLUCAN 150 MG TABS (FLUCONAZOLE) 1 tab by mouth q wk x 3 wk  #3 x 0   Entered and Authorized by:   Danise Edge MD   Signed by:   Danise Edge MD on 06/11/2010   Method used:   Electronically to        [facility]. [phone]* (retail)       [phone] [address]       [place], [place]  [address]       Ph: [phone]       Fax: [phone]   RxID:   [id number] AZITHROMYCIN 250 MG TABS (AZITHROMYCIN) 2 tabs by mouth once and then 1 tab by mouth once daily x 4d  #6 x 0   Entered and Authorized by:   Danise Edge MD   Signed by:   Danise Edge MD on 06/11/2010   Method used:   Electronically to        [facility]. [phone]* (retail)       [phone] [address]       [place], [place]  [address]       Ph: [phone]       Fax: [phone]   RxID:   [id number]   Laboratory Results   Urine Tests    Routine Urinalysis   Color: yellow Appearance: Hazy Glucose: negative   (Normal Range: Negative) Bilirubin: negative   (Normal Range: Negative) Ketone: negative   (Normal Range: Negative) Spec. Gravity: 1.010   (Normal Range: 1.003-1.035) Blood: negative   (Normal Range: Negative) pH: 8.5   (Normal Range: 5.0-8.0) Protein: trace   (Normal Range: Negative) Urobilinogen: 0.2   (Normal Range: 0-1) Nitrite: negative   (Normal Range: Negative) Leukocyte Esterace: negative   (Normal Range: Negative)

## 2010-12-03 NOTE — Progress Notes (Signed)
Summary: med refill  Phone Note Call from Patient Call back at Home Phone [phone]   Caller: Patient Call For: Stacie Glaze MD Reason for Call: Acute Illness, Talk to Doctor Complaint: Urinary/GYN Problems Summary of Call: pt would like new rx flexiril  [facility] [phone] Initial call taken by: Heron Sabins,  September 10, 2010 11:36 AM  Follow-up for Phone Call        your flexeril has been discontinued- why do you need if filled? per dr Lovell Sheehan Follow-up by: Willy Eddy, LPN,  September 10, 2010 12:42 PM  Additional Follow-up for Phone Call Additional follow up Details #1::        Has cramping in back and knots....Marland KitchenMarland KitchenLots of walking tomorrow night...Marland KitchenMarland KitchenMarland Kitchenis feeding the homeless. Additional Follow-up by: Lynann Beaver CMA AAMA,  September 10, 2010 1:28 PM    Additional Follow-up for Phone Call Additional follow up Details #2::    per dr Lovell Sheehan may have flexeril #10  New/Updated Medications: FLEXERIL 10 MG TABS (CYCLOBENZAPRINE HCL) 1 two times a day as needed Prescriptions: FLEXERIL 10 MG TABS (CYCLOBENZAPRINE HCL) 1 two times a day as needed  #10 x 0   Entered by:   Willy Eddy, LPN   Authorized by:   Stacie Glaze MD   Signed by:   Willy Eddy, LPN on 47/82/9562   Method used:   Electronically to        [facility]. [phone]* (retail)       [phone] [address]       [place], [place]  [address]       Ph: [phone]       Fax: [phone]   RxID:   [id number]  Pt. notified.

## 2010-12-03 NOTE — Progress Notes (Signed)
Summary: shot today  Phone Note Call from Patient Call back at Home Phone [phone]   Caller: Patient Call For: Stacie Glaze MD Summary of Call: pt is still having leg pain pt decline to see another doc.Pt would like to come in for a shot Initial call taken by: Heron Sabins,  December 30, 2009 11:03 AM  Follow-up for Phone Call        what kind of shot is she talking about-  Follow-up by: Willy Eddy, LPN,  December 30, 2009 1:58 PM  Additional Follow-up for Phone Call Additional follow up Details #1::        c/o pain in back and both legs- wants pain injection Additional Follow-up by: Raechel Ache, RN,  December 30, 2009 2:26 PM    Additional Follow-up for Phone Call Additional follow up Details #2::    called. Follow-up by: Raechel Ache, RN,  December 30, 2009 2:29 PM

## 2010-12-03 NOTE — Procedures (Signed)
Summary: Upper Endoscopy  Patient: Gina [name] Note: All result statuses are Final unless otherwise noted.  Tests: (1) Upper Endoscopy (EGD)   EGD Upper Endoscopy       DONE (C)     [facility]     [address]     [place], [place]  [address]           ENDOSCOPY PROCEDURE REPORT           PATIENT:  [name], [name]  MR#:  [id number]     BIRTHDATE:  1976/09/02, 34 yrs. old  GENDER:  female           ENDOSCOPIST:  Wilhemina Bonito. Eda Keys, MD     Referred by:  Office           PROCEDURE DATE:  11/03/2010     PROCEDURE:  EGD, diagnostic 43235,     Maloney Dilation of Esophagus -     68F     ASA CLASS:  Class II     INDICATIONS:  dysphagia, dilation of esophageal stricture           MEDICATIONS:   propofol (Diprivan) 260 mg IV, MAC sedation,     administered by CRNA     TOPICAL ANESTHETIC:  none           DESCRIPTION OF PROCEDURE:   After the risks benefits and     alternatives of the procedure were thoroughly explained, informed     consent was obtained.  The LB [id number] [id number] endoscope was     introduced through the mouth and advanced to the second portion of     the duodenum, without limitations.  The instrument was slowly     withdrawn as the mucosa was fully examined.     <<PROCEDUREIMAGES>>           Mild ringing of the proximal and mid esophagus. 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  The stomach was entered and closely examined. The     antrum, angularis, and lesser curvature were well visualized,     including a retroflexed view of the cardia and fundus. The stomach     wall was normally distensable. The scope passed easily through the     pylorus into the duodenum.  The duodenal bulb was normal in     appearance, as was the postbulbar duodenum.    Retroflexed views     revealed no abnormalities.    The scope was then withdrawn from     the patient and the procedure completed.           THERAPY: 68F  MALONEY DILATOR PASSED W/O RESISTANCE. SCANT HEME     PRESENT. RELOOK EGD SHOWS MINOR SUPERFICIAL TRAUMA OF PROXIMAL     MUCOSA ONLY.           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 Resume     prior diet tomorrow.  2) Continue PREVACID     3) Follow-up AS NEEDED           ______________________________     Wilhemina Bonito. Eda Keys, MD           CC:  Stacie Glaze, MD, The Patient           n.     REVISED:  11/05/2010 12:19 PM     eSIGNED:   Wilhemina Bonito. Eda Keys at 11/05/2010 12:19 PM           [name], [phone]  Note: An exclamation mark (!) indicates a result that was not dispersed into the flowsheet. Document Creation Date: 11/05/2010 12:20 PM _______________________________________________________________________  (1) Order result status: Final Collection or observation date-time: 11/03/2010 10:55 Requested date-time:  Receipt date-time:  Reported date-time:  Referring Physician:   Ordering Physician: Fransico Setters [phone]) Specimen Source:  Source: Launa Grill Order Number: [id number] Lab site:

## 2010-12-03 NOTE — Progress Notes (Signed)
Summary: REQ FOR REFILL RX---pocketbook found  Phone Note Call from Patient   Caller: Patient [phone] Reason for Call: Refill Medication, Referral Summary of Call: Pt called to adv that she lost her Rx for med: Oxycodone .... Pt is req that a new script be written for her to p/u today, pt adv she is completely out of med..... Pt can be reached at [phone] when same is ready for p/u.  Initial call taken by: Debbra Riding,  January 12, 2010 8:39 AM  Follow-up for Phone Call        looks like refill is due   100 on 2/11 Follow-up by: Stacie Glaze MD,  January 12, 2010 8:51 AM  Additional Follow-up for Phone Call Additional follow up Details #1::        it was filled 01-09-10- pt informed police will have to investigate before due to being 100 percocet scripts lost. Additional Follow-up by: Willy Eddy, LPN,  January 12, 2010 9:26 AM    Additional Follow-up for Phone Call Additional follow up Details #2::    Pt called back and said that she had some good news. She found her pocketbook with the rx in it. Follow-up by: Warnell Forester,  January 12, 2010 9:43 AM

## 2010-12-03 NOTE — Progress Notes (Signed)
Summary: triage / swallowing problems  Phone Note Call from Patient Call back at Home Phone [phone]   Caller: Patient Call For: Dr. Marina Goodell Reason for Call: Talk to Nurse Summary of Call: burning sensation in esophagus when eats or drinks... severe dysphagia... would like to be called back IMMEDIATLY per pt Initial call taken by: Vallarie Mare,  May 15, 2010 11:40 AM  Follow-up for Phone Call        Took pills and they got stuck in her throat this am.Did  bring  part of one pill  up but says there is still more in there.Marland KitchenMarland KitchenCan talk and take in h20 but says dysphagia is getting worse as she is hoarse.Advised to go to ER.Says they won't do anything and will just sit and wait 2 hrs.Says she is going to go induce vomitnig  Follow-up by: Teryl Lucy RN,  May 15, 2010 11:54 AM  Additional Follow-up for Phone Call Additional follow up Details #1::        she needs to see Dr. Christella Hartigan this p.m. Additional Follow-up by: Mardella Layman MD FACG,  May 15, 2010 12:29 PM    Additional Follow-up for Phone Call Additional follow up Details #2::    message left for pt. to come at 1:45 this afternoon to see Dr.Jacobs   Teryl Lucy RN  May 15, 2010 12:41 PM pt. ret'd call and she says the pill came up and was only a tiny piece but she had called 911 and they just watched her until pill came up.Says she sees no reason to see Dr.Jacobs this afternoon but wants procedure moved up to next week and it is ok with her if DrJacobs  does it since he is hospital Dr. At 1:15 Pt.called back saying she just took her pills and feels like she needs seen because she feels like they didn't go down well but no problem like this a.m. Speaking clearly and tolerating fluids.Dscussed with Dr.Jacobs and he said we could get barium esophagram but pt. refuses he recommends this  be discussed with Dr.Karmel Patricelli when he returns to the office on Monday about the procedure date. Follow-up by: Teryl Lucy RN,  May 15, 2010 12:57  PM  Additional Follow-up for Phone Call Additional follow up Details #3:: Details for Additional Follow-up Action Taken: Her esophageal issues are complicated by her psychiatric issues. She shuold be fine with liquids and soft foods until her procecdure as scheduled Additional Follow-up by: Hilarie Fredrickson MD,  May 17, 2010 3:25 PM  Message left to call back   Pt. informed of Dr.Jaidee Stipe's orders  Elnita Maxwell lohr,R.N.

## 2010-12-03 NOTE — Progress Notes (Signed)
Summary: Triage/ procedure time  Phone Note Call from Patient Call back at Home Phone [phone]   Caller: Patient Call For: Dr. Marina Goodell Reason for Call: Talk to Nurse Summary of Call: has some questions about her procedure that is sch'd for tomorrow Initial call taken by: Karna Christmas,  May 26, 2010 10:55 AM  Follow-up for Phone Call        Pt. says procedure has been changed to Wednesday.[facility] veriified that [employer]. changed pt to7/27/11 at 11.a.m.Given new instructions. Follow-up by: Teryl Lucy RN,  May 26, 2010 11:25 AM  Additional Follow-up for Phone Call Additional follow up Details #1::        yes

## 2010-12-03 NOTE — Progress Notes (Signed)
Summary: refill  Phone Note Refill Request Call back at Home Phone [phone] Message from:  Patient---live call  Refills Requested: Medication #1:  OXYCODONE-ACETAMINOPHEN 10-325 MG TABS one by mouth q 4 hours as needed not to exceed 5 a day will be out of meds  by Sunday.  Initial call taken by: Warnell Forester,  April 17, 2010 1:18 PM    Prescriptions: OXYCODONE-ACETAMINOPHEN 10-325 MG TABS (OXYCODONE-ACETAMINOPHEN) one by mouth q 4 hours as needed not to exceed 5 a day  #100 x 0   Entered by:   Willy Eddy, LPN   Authorized by:   Stacie Glaze MD   Signed by:   Willy Eddy, LPN on 78/46/9629   Method used:   Print then Give to Patient   RxID:   [id number]

## 2010-12-03 NOTE — Progress Notes (Signed)
Summary: swallowing trouble  Phone Note Call from Patient   Summary of Call: Pt, says she drank liquids and ate soft foods yesterday without dysphagia.Today she increased diet and she ate a cheese enchilada and it got hung up and it was 45 min. until it went down.Says she was able to eat anything post her dilation before.Advised to eat soft,bland foods and liquids until discussed with Dr.Perry in the a.m. Initial call taken by: Teryl Lucy RN,  November 05, 2010 4:35 PM  Follow-up for Phone Call        nothing to add. just dilated with large dilator w/o difficulty. just chew food extremely well... Follow-up by: Hilarie Fredrickson MD,  November 05, 2010 9:51 PM  Additional Follow-up for Phone Call Additional follow up Details #1::        Patient wants Dr Marina Goodell to be aware that she had chest pain yesterday, no chest pain this am.  She wants Dr Marina Goodell to know that when she eats all the food "comes back up", she reports she is chewing her food "very well".  I have asked her to describe what happens, she says that when she eats anything it goes down , but then comes right back up even yogurt "came back up" after she swallowed it.   She keeps stating that "it just doesn't feel right".  She has no specific complaints this am of pain or fever.  She is taking Prevacid 30 minutes prior to the first meal of the day.  I reviewed an antireflux diet with her.  Patient instructed to maintain an anti-reflux diet. Advised to avoid caffeine, mint, citrus foods/juices, tomatoes,  chocolate, NSAIDS/ASA products.  Instructed not to eat within 2 hours of exercise or bed, multiple small meals are better than 3 large meals.  Need to take PPI 30 minutes prior to 1st meal of the day.  Patient states that her problem is not reflux, she knows" how that feels and this is not reflux"  Dr Marina Goodell Please advise.. Additional Follow-up by: Darcey Nora RN, CGRN,  November 06, 2010 9:38 AM    Additional Follow-up for Phone Call Additional  follow up Details #2::    honestly, I'm not sure why she is having such difficulties based on the endoscopy. It is possible that a significant portion of her issues are related to her known problems with anxiety. From a GI standpoint, I do not have any new recommendations. Follow-up by: Hilarie Fredrickson MD,  November 06, 2010 10:24 AM  Additional Follow-up for Phone Call Additional follow up Details #3:: Details for Additional Follow-up Action Taken: Patient advised of Dr Lamar Sprinkles recommendations. I again reviewed the antireflux diet and measures with the patient  Additional Follow-up by: Darcey Nora RN, CGRN,  November 06, 2010 10:39 AM

## 2010-12-03 NOTE — Procedures (Signed)
Summary: EGD-Diagnostic/[place] [facility]  EGD-Diagnostic/[place] [facility]   Imported By: Maryln Gottron 11/07/2009 12:14:26  _____________________________________________________________________  External Attachment:    Type:   Image     Comment:   External Document

## 2010-12-03 NOTE — Progress Notes (Signed)
Summary: new rx  Phone Note Call from Patient Call back at Home Phone [phone]   Caller: Patient Call For: Stacie Glaze MD Summary of Call: pt needs rx for oxycodone she will be running out this weekend. Initial call taken by: Heron Sabins,  January 09, 2010 9:23 AM  Follow-up for Phone Call        will call when ready for pick up- pt informed Follow-up by: Willy Eddy, LPN,  January 09, 2010 10:35 AM    Prescriptions: OXYCODONE-ACETAMINOPHEN 10-325 MG TABS (OXYCODONE-ACETAMINOPHEN) one by mouth q 4 hours as needed not to exceed 5 a day  #100 x 0   Entered by:   Willy Eddy, LPN   Authorized by:   Stacie Glaze MD   Signed by:   Willy Eddy, LPN on 82/95/6213   Method used:   Print then Give to Patient   RxID:   [id number]

## 2010-12-03 NOTE — Progress Notes (Signed)
Summary: REQ FOR MED REFILL J pt req dr Maryclare Labrador Dr. Shela Commons 1-7  Phone Note Call from Patient   Caller: Patient @ [phone] Reason for Call: Refill Medication Complaint: Breathing Problems, Urinary/GYN Problems Summary of Call: Pt called to req a refill on med (OXYCODONE-ACETAMINOPHEN 10-325 MG TABS)..... Pt adv that she can be reached  at [phone] when same is ready for p/u.  Initial call taken by: Debbra Riding,  November 07, 2009 1:33 PM  Follow-up for Phone Call        Pt called to check on status of refill. Pt is going to run out of meds by Sunday 11/09/09.  Call pt when ready for pick up today.   Called again & requested that Dr. Clent Ridges get message.  He sees her kids.  It's in her chart that she gets this every 20 days.  She also has ovarian cyst that gives her pain every other month & she didn't even go to ER because they just tell her to take her pain med.  Rudy Jew, RN  November 07, 2009 4:48 PM  Follow-up by: Lucy Antigua,  November 07, 2009 4:21 PM  Additional Follow-up for Phone Call Additional follow up Details #1::        No refills. She has to get these from Dr. Lovell Sheehan. She needs to ask for refills before she gets close to running out like this Additional Follow-up by: Nelwyn Salisbury MD,  November 07, 2009 4:58 PM    Additional Follow-up for Phone Call Additional follow up Details #2::    Pt called to req a refill on med (oxycodone)...  Pt adv that she ran out of med yesterday and will need to obtain same before bad weather comes into our area this am...Marland KitchenMarland KitchenMarland Kitchen Pt can be reached at [phone].  Follow-up by: Debbra Riding,  November 10, 2009 8:26 AM  Prescriptions: OXYCODONE-ACETAMINOPHEN 10-325 MG TABS (OXYCODONE-ACETAMINOPHEN) one by mouth q 4 hours as needed not to exceed 5 a day  #100 x 0   Entered by:   Willy Eddy, LPN   Authorized by:   Stacie Glaze MD   Signed by:   Willy Eddy, LPN on 43/32/9518   Method used:   Print then Give to Patient  RxID:   [id number]

## 2010-12-03 NOTE — Assessment & Plan Note (Signed)
Summary: dysphagia/sheri   History of Present Illness Visit Type: Follow-up Visit Primary GI MD: [name] MD Primary Provider: [name], MD  Requesting Provider: na Chief Complaint: Dysphagia with solids, bloating, constipation, and belching  History of Present Illness:   Patient is a 35 year old female known to Dr. Marina Goodell for chronic dysphagia. 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 Post- dilation she did well for about 6 months. Repeat EGD for recurrent symptoms in July 2011 was unremarkable but she was dilated empirically and did well last month. Now only able to tolerate hummus, stage 2 baby food and Ensure. Fluids beginning to be problematic as well. Patient states she really wants to eat. Always has a fear of choking, choked on food as a child.   Patient has chronic constipation and takes fiber gummies, uses enemas. Took Mg Citrate Sunday without significant response. Miralax doesn't work for her. States her constipation is combination of IBS and narcotics. States her mom has all these same problems.      GI Review of Systems    Reports belching, bloating, and  dysphagia with solids.      Denies abdominal pain, acid reflux, chest pain, dysphagia with liquids, heartburn, loss of appetite, nausea, vomiting, vomiting blood, weight loss, and  weight gain.      Reports constipation and  irritable bowel syndrome.     Denies anal fissure, black tarry stools, change in bowel habit, diarrhea, diverticulosis, fecal incontinence, heme positive stool, hemorrhoids, jaundice, light color stool, liver problems, rectal bleeding, and  rectal pain.    Current Medications (verified): 1)  Alprazolam 0.5 Mg Tabs (Alprazolam) .... One Tablet By Mouth Six  Times A Day 2)  Lexapro 20 Mg Tabs (Escitalopram Oxalate) .... One Tablet By Mouth Once Daily 3)  Oxycodone-Acetaminophen 10-325 Mg Tabs (Oxycodone-Acetaminophen) .... One By Mouth Q 4 Hours As Needed Not  To Exceed 5 A Day 4)  Prevacid Solutab 30 Mg Tbdp (Lansoprazole) .... One By Mouth Once Daily As Needed 5)  Flexeril 10 Mg Tabs (Cyclobenzaprine Hcl) .Marland Kitchen.. 1 Two Times A Day As Needed 6)  Magnesium Citrate 1.745 Gm/59ml Soln (Magnesium Citrate) .... As Needed 7)  Fleet Enema 7-19 Gm/132ml Enem (Sodium Phosphates) .... As Needed 8)  Ensure  Liqd (Nutritional Supplements) .... Once Daily  Allergies (verified): 1)  ! Zofran (Ondansetron Hcl) 2)  ! Nicoderm Cq (Nicotine) 3)  ! * Lomictal 4)  Chantix (Varenicline Tartrate)  Past History:  Past Medical History: h/o hives Allergic rhinitis bipolar Anxiety Disorder Arrhythmia Arthritis Chronic Headaches Irritable Bowel Syndrome Urinary Tract Infection GERD  Past Surgical History: Reviewed history from 09/19/2009 and no changes required. c-section, repeat - 09/01/2001 Hernia Surgery x 3 Hysterectomy Endome  Family History: Reviewed history from 09/19/2009 and no changes required. Father with first MI at 30, mother with MV prolapse, otherwise negative No FH of Colon Cancer: Family History of Liver Cancer:MGGF Family History of Colon Polyps:Father and MGM Family History of Diabetes: Father, MGM, and MGF Family History of Heart Disease: MGGM Family History of Irritable Bowel Syndrome:Mother   Social History: Reviewed history from 09/19/2009 and no changes required. Lives with her husband, [name], and her 37 yo daughter, [name]. 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  Alcohol Use - no Daily Caffeine Use: 4-5 daily  Illicit Drug Use - no  Review of Systems       The patient complains of back pain, fatigue, and headaches-new.  The patient denies allergy/sinus, anemia,  anxiety-new, arthritis/joint pain, blood in urine, breast changes/lumps, change in vision, confusion, cough, coughing up blood, depression-new, fainting, fever, hearing problems, heart murmur, heart rhythm changes, itching, menstrual pain, muscle pains/cramps,  night sweats, nosebleeds, pregnancy symptoms, shortness of breath, skin rash, sleeping problems, sore throat, swelling of feet/legs, swollen lymph glands, thirst - excessive , urination - excessive , urination changes/pain, urine leakage, vision changes, and voice change.    Vital Signs:  Patient profile:   35 year old female Height:      67 inches Pulse rate:   88 / minute Pulse rhythm:   regular BP sitting:   112 / 64  (left arm) Cuff size:   regular  Vitals Entered By: Ok Anis CMA (September 15, 2010 9:38 AM)  Physical Exam  General:  Depressed appearing white female Head:  Normocephalic and atraumatic. Eyes:  Conjunctiva pink, no icterus.  Mouth:  Yellowish white stains on tongue Neck:  no obvious masses  Lungs:  Clear throughout to auscultation. Heart:  Regular rate and rhythm; no murmurs, rubs,  or bruits. Abdomen:  mild diffuse tenderness, no masses felt. Active bowel sounds. No liver enlargement. Msk:  Symmetrical with no gross deformities. Normal posture. Extremities:  No palmar erythema, no edema.  Neurologic:  Alert and  oriented x4;  grossly normal neurologically. Skin:  Intact without significant lesions or rashes. Cervical Nodes:  No significant cervical adenopathy. Psych:  Alert and cooperative. Flat affect.   Impression & Recommendations:  Problem # 1:  DYSPHAGIA (EAV-409.81) Assessment Deteriorated Chronic problem. She gets transient relief with dilations. History of multiple concentric esophageal rings on EGD Dec. 2010 but negative repeat exam July 2011 which was done for recurrent dysphagia. Patient has such significant anxiety and depression that it is hard to sort out her symptoms but she clearly isn't doing well. Patient doesn't really want to undergo another EGD, nor does she want to do a barium swallow At this point. Our options are limited, I informed her of that. Rule out eosinophilic esophagitis given persistent dysphagia and history of esophageal  rings.  Trial of Fluticasone without spacer two sprays twice daily. Return for reevaluation in 4-6 weeks.                  Problem # 2:  ANXIETY (ICD-300.00) Assessment: Comment Only  Problem # 3:  CONSTIPATION (ICD-564.00) Assessment: Deteriorated Chronic constipation on chronic narcotics. Bottle of Mg Citrate minimally effective. Will purge bowels over next 24 hours with Golytely. She will take 1/2 today and remaining tomorrow. Recommended Miralax, it doesn't work for her.   Patient Instructions: 1)  We sent a prescription for [employer] [facility] [place]. 2)  We made you an appointment with Dr. [name] on 10-20-10 at 2:45 PM.  3)  Copy sent to : [name], MD Prescriptions: GOLYTELY 236 GM SOLR (PEG 3350-KCL-NABCB-NACL-NASULF) Use as directed.  #1 bottle x 0   Entered and Authorized by:   Willette Cluster NP   Signed by:   Willette Cluster NP on 09/15/2010   Method used:   Electronically to        [facility]. [phone]* (retail)       [phone]       [place], [place]  [address]       Ph: [phone]       Fax: [phone]   RxID:   [id number] FLOVENT HFA 110 MCG/ACT AERO (FLUTICASONE PROPIONATE  HFA)  2 sprays in the mouth twice daily, Rinse the mouth with water and spitthen nothing by mouth for 30 min .afterwards,  #1 x 0   Entered by:   Lowry Ram NCMA   Authorized by:   Willette Cluster NP   Signed by:   Lowry Ram NCMA on 09/15/2010   Method used:   Electronically to        [facility]. [phone]* (retail)       [phone] [address]       [place], [place]  [address]       Ph: [phone]       Fax: [phone]   RxID:   [id number]

## 2010-12-03 NOTE — Progress Notes (Signed)
Summary: Triage  Phone Note Call from Patient Call back at Home Phone [phone]   Caller: Patient Call For: Dr. Marina Goodell Reason for Call: Talk to Nurse Details for Reason: Triage Summary of Call: Pt. states she is having increasing problems not being able to eat. Currently having to eat baby food. Would like to see doc ASAP, or would be agreeable to seeing someone else if he's not available. Please call. Initial call taken by: Schuyler Amor,  September 11, 2010 10:47 AM  Follow-up for Phone Call        Left message for patient to call back Darcey Nora RN, [facility]  September 11, 2010 11:49 AM  patient with continued dysphagia  patient will come in and see Willette Cluster RNP on Tues 09/15/10 Follow-up by: Darcey Nora RN, CGRN,  September 11, 2010 5:12 PM

## 2010-12-03 NOTE — Progress Notes (Signed)
Summary: refill  Phone Note Call from Patient Call back at Home Phone [phone]   Caller: Patient----live call Reason for Call: Refill Medication Summary of Call: Rf Oxycodone. she will be out of meds tomorrow. Dr Lovell Sheehan told pt to call today for a refill. Initial call taken by: Warnell Forester,  December 19, 2009 12:53 PM    Prescriptions: OXYCODONE-ACETAMINOPHEN 10-325 MG TABS (OXYCODONE-ACETAMINOPHEN) one by mouth q 4 hours as needed not to exceed 5 a day  #100 x 0   Entered by:   Willy Eddy, LPN   Authorized by:   Stacie Glaze MD   Signed by:   Willy Eddy, LPN on 29/56/2130   Method used:   Print then Give to Patient   RxID:   [id number]

## 2010-12-03 NOTE — Progress Notes (Signed)
Summary: Triage  Phone Note Call from Patient Call back at Home Phone [phone]   Caller: Patient Call For: Dr. Marina Goodell Reason for Call: Talk to Nurse Summary of Call: Pt is calling because her left arm and back are hurting and she is very weak and drained Initial call taken by: [place] Johnson,  November 06, 2010 3:06 PM  Follow-up for Phone Call        Patient reports she is having left arm pain, chest pain , and radiates  into her back.  She reports a heavyness in her chest.  Dr Marina Goodell suggests she be seen by her primary care MD. She reports that they can't see her today.  I have advised her with these symptoms she should be evaluated at an Urgent care. Follow-up by: Darcey Nora RN, CGRN,  November 06, 2010 3:16 PM

## 2010-12-03 NOTE — Assessment & Plan Note (Signed)
Summary: F/U Dysphagia. saw Gina [name] ACNP   History of Present Illness Visit Type: follow up Primary GI MD: Gina Flemings MD Primary Gina [name]: Gina Capers, MD  Requesting Gina [name]: na Chief Complaint: Solid food dysphagia with belching and regurgitation with some thicker textured foods. Pt states she eats a lot of Ensure but is still gaining weight.  History of Present Illness:   35 year old female with chronic psychiatric issues who presents today for followup regarding dysphagia. She has a history of GERD and IBS.Marland Kitchen She was seen by our extender 4 weeks ago. Because eosinophilic esophagitis was previously mentioned as a possible cause for her esophageal rings, she was empirically placed on fluticasone therapy. In speaking with her, difficult to assess compliance. It does not appear that she has improved. She remains afraid to eat. Her last esophageal dilation was in July. She tells me that her swallowing is okay until the end of October when she began to have difficulties, again with solids. Her other complaint is constipation. She takes magnesium citrate and enemas as needed.   GI Review of Systems    Reports belching, dysphagia with solids, nausea, and  vomiting.      Denies abdominal pain, acid reflux, bloating, chest pain, dysphagia with liquids, heartburn, loss of appetite, vomiting blood, weight loss, and  weight gain.      Reports constipation and  irritable bowel syndrome.     Denies anal fissure, black tarry stools, change in bowel habit, diarrhea, diverticulosis, fecal incontinence, heme positive stool, hemorrhoids, jaundice, light color stool, liver problems, rectal bleeding, and  rectal pain.    Current Medications (verified): 1)  Alprazolam 0.5 Mg Tabs (Alprazolam) .... One Tablet By Mouth Six  Times A Day 2)  Lexapro 10 Mg Tabs (Escitalopram Oxalate) .... One Tablet By Mouth Once Daily 3)  Oxycodone-Acetaminophen 10-325 Mg Tabs (Oxycodone-Acetaminophen) .... One By Mouth Q 4  Hours As Needed Not To Exceed 5 A Day 4)  Prevacid Solutab 30 Mg Tbdp (Lansoprazole) .... One By Mouth Once Daily As Needed 5)  Flexeril 10 Mg Tabs (Cyclobenzaprine Hcl) .Marland Kitchen.. 1 Two Times A Day As Needed 6)  Magnesium Citrate 1.745 Gm/38ml Soln (Magnesium Citrate) .... As Needed 7)  Fleet Enema 7-19 Gm/18ml Enem (Sodium Phosphates) .... As Needed 8)  Ensure  Liqd (Nutritional Supplements) .... Once Daily 9)  Flovent Hfa 110 Mcg/act Aero (Fluticasone Propionate  Hfa) .... Once Daily  Allergies (verified): 1)  ! Zofran (Ondansetron Hcl) 2)  ! Nicoderm Cq (Nicotine) 3)  ! * Lomictal 4)  Chantix (Varenicline Tartrate)  Past History:  Past Medical History: Reviewed history from 09/15/2010 and no changes required. h/o hives Allergic rhinitis bipolar Anxiety Disorder Arrhythmia Arthritis Chronic Headaches Irritable Bowel Syndrome Urinary Tract Infection GERD  Past Surgical History: Reviewed history from 09/19/2009 and no changes required. c-section, repeat - 09/01/2001 Hernia Surgery x 3 Hysterectomy Endome  Family History: Reviewed history from 09/19/2009 and no changes required. Father with first MI at 45, mother with MV prolapse, otherwise negative No FH of Colon Cancer: Family History of Liver Cancer:MGGF Family History of Colon Polyps:Father and MGM Family History of Diabetes: Father, MGM, and MGF Family History of Heart Disease: MGGM Family History of Irritable Bowel Syndrome:Mother   Social History: Reviewed history from 09/15/2010 and no changes required. Lives with her husband, Gina [name], and her 76 yo daughter, Gina [name]. 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  Alcohol Use - no Daily Caffeine Use: 4-5 daily  Illicit Drug Use - no  Review of  Systems       The patient complains of anxiety-new.  The patient denies allergy/sinus, anemia, arthritis/joint pain, back pain, blood in urine, breast changes/lumps, change in vision, confusion, cough, coughing up blood,  depression-new, fainting, fatigue, fever, headaches-new, hearing problems, heart murmur, heart rhythm changes, itching, menstrual pain, muscle pains/cramps, night sweats, nosebleeds, pregnancy symptoms, shortness of breath, skin rash, sleeping problems, sore throat, swelling of feet/legs, swollen lymph glands, thirst - excessive , urination - excessive , urination changes/pain, urine leakage, vision changes, and voice change.    Vital Signs:  Patient profile:   35 year old female Height:      67 inches Weight:      144 pounds BMI:     22.64 Pulse rate:   88 / minute Pulse rhythm:   regular BP sitting:   128 / 72 Cuff size:   regular  Vitals Entered By: Gina [name] CMA Gina [name]) (October 12, 2010 3:00 PM)  Physical Exam  General:  Well developed, well nourished, no acute distress. Abdomen:  not reexamined Pulses:  Normal pulses noted. Neurologic:  Alert and  oriented x4. Psych:  Alert. Depressed and tearful at times.   Impression & Recommendations:  Problem # 1:  DYSPHAGIA (UVO-536.64) recurrent dysphagia. Ringed esophagus previously treated with esophageal dilation. Seems to have transient improvement. We discussed her problem. We also discussed eosinophilic esophagitis and the rationale behind an empiric trial of fluticasone. She had innumerable questions, some of which were relevant, many of which were not. These were answered completely to the best of my ability. She insisted that she is too young to have these problems. She is worried that repeat dilation is going to hurt her esophagus. On the other hand, she is desperate regarding her ability to eat normally. She inquired about other diagnostic options. I offered barium esophagogram with tablet. She states that she cannot complete this exam. Discussions with her go on and on... I offered her tertiary care evaluation by esophageal GI specialist. She was not interested. The recommendation was for EGD under propofol with probable  repeat dilation. She wanted to know if I could do it this week. I cannot. This upset her.  Other Orders: EGD SAV (EGD SAV)  Patient Instructions: 1)  EGD Sav. with [facility] 11/03/10 2:00 pm arrive at 1:00 pm 2)  Upper Endoscopy brochure given.  3)  The medication list was reviewed and reconciled.  All changed / newly prescribed medications were explained.  A complete medication list was provided to the patient / caregiver.

## 2010-12-03 NOTE — Letter (Signed)
Summary: EGD Instructions  [facility]  [address] [place], [place] [address]   Phone: [phone]  Fax: [phone]       Gina [name]    1976-09-30    MRN: [id number]       Procedure Day /Date:THURSDAY, 05/28/10     Arrival Time: 11:15 PM     Procedure Time:1:15 PM     Location of Procedure:                     [facility] ( Outpatient Registration)    PREPARATION FOR ENDOSCOPY WITH DILT/PROPOFUL   OnTHURSDAY, 05/28/10 THE DAY OF THE PROCEDURE:  1.  NOTHING TO EAT OR DRINK AFTER MIDNIGHT                                                                                                  CLEAR LIQUIDS INCLUDE: Water Jello Ice Popsicles Tea (sugar ok, no milk/cream) Powdered fruit flavored drinks Coffee (sugar ok, no milk/cream) Gatorade Juice: apple, white grape, white cranberry  Lemonade Clear bullion, consomm, broth Carbonated beverages (any kind) Strained chicken noodle soup Hard Candy   MEDICATION INSTRUCTIONS  Unless otherwise instructed, you should take regular prescription medications with a small sip of water as early as possible the morning of your procedure.         OTHER INSTRUCTIONS  You will need a responsible adult at least 33 years of age to accompany you and drive you home.   This person must remain in the waiting room during your procedure.  Wear loose fitting clothing that is easily removed.  Leave jewelry and other valuables at home.  However, you may wish to bring a book to read or an iPod/MP3 player to listen to music as you wait for your procedure to start.  Remove all body piercing jewelry and leave at home.  Total time from sign-in until discharge is approximately 2-3 hours.  You should go home directly after your procedure and rest.  You can resume normal activities the day after your procedure.  The day of your procedure you should not:   Drive   Make legal decisions   Operate machinery   Drink  alcohol   Return to work  You will receive specific instructions about eating, activities and medications before you leave.    The above instructions have been reviewed and explained to me by   _______________________    I fully understand and can verbalize these instructions _____________________________ Date _________

## 2010-12-03 NOTE — Progress Notes (Signed)
Summary: stress  Phone Note Call from Patient Call back at Home Phone [phone]   Caller: Patient Call For: Stacie Glaze MD Summary of Call: Pt is stressed out and abdomen feels tight, and is taking a whole Xanax which hasn't helped??  What should she do?  Feels very stressed.  Initial call taken by: Lynann Beaver CMA,  August 05, 2010 12:01 PM  Follow-up for Phone Call        refer to dr Dellia Cloud for stress mangement per dr Lovell Sheehan Follow-up by: Willy Eddy, LPN,  August 05, 2010 2:20 PM  Additional Follow-up for Phone Call Additional follow up Details #1::        Phone Call Completed Additional Follow-up by: Rudy Jew, RN,  August 05, 2010 2:29 PM  New Problems: STRESS DISORDER (ICD-V62.89)   New Problems: STRESS DISORDER (ICD-V62.89)

## 2010-12-03 NOTE — Progress Notes (Signed)
Summary: Call back from Gina [name]  Phone Note Call from Patient   Caller: Patient Call For: nurse Reason for Call: Talk to Nurse Complaint: Abdominal Pain Details of Complaint: "The gas is pressing against my incisions." Summary of Call: Transfer of call from MD office. Pt. c/o discomfort with gas. "The gas is pressing against my incisions and is uncomfortable. I have passed gas, but it still is uncomfortable.Can I take a gas-x and how many can I take." Instructed pt. that she can take the gasx one now and again in 30 mins if no relief. Also encouraged warm fluids as per written instructions given on dc.Pt. stating she is  drinking coffee. Pt. verbalized satisfaction with instruction. Phone note forwarded to Dr. Marina Goodell for any additional instructions. Initial call taken by: Weston Brass RN,  November 03, 2010 1:49 PM  Follow-up for Phone Call        sounds fine Follow-up by: Hilarie Fredrickson MD,  November 03, 2010 2:45 PM

## 2010-12-03 NOTE — Procedures (Signed)
Summary: Endoscopy Procedure Report/Dr. [name]  Endoscopy Procedure Report/Dr. [name]   Imported By: Maryln Gottron 06/15/2010 15:09:40  _____________________________________________________________________  External Attachment:    Type:   Image     Comment:   External Document

## 2010-12-03 NOTE — Progress Notes (Signed)
Summary: choking on food / anxious  Phone Note Call from Patient Call back at Home Phone [phone]   Summary of Call: pt states she is having voice changes, she is choking on her food. Pt states she feels like she has a hole in her throat.  States she has a history of anxiety and really needs to speak to someone now. Initial call taken by: Francee Piccolo CMA Duncan Dull),  April 15, 2010 3:48 PM  Follow-up for Phone Call        pt was very anxious on the phone about her dysphagia and reflux symtoms.  She has not been taking her Prilosec as recommended by Dr Marina Goodell.  I advised her to take her Prilosec and be sure to chew her food well and avoid things like caffeine, chocolate, and mints.  She will elevate the head of her bed and an ROV was made to see Dr Marina Goodell. Follow-up by: Chales Abrahams CMA Duncan Dull),  April 15, 2010 4:05 PM  Additional Follow-up for Phone Call Additional follow up Details #1::        agree Additional Follow-up by: Hilarie Fredrickson MD,  April 18, 2010 11:20 AM     Appended Document: choking on food / anxious Patient has appointment on 05/13/10 w/Dr Marina Goodell

## 2010-12-03 NOTE — Progress Notes (Signed)
Summary: appointment requested ASAP  Phone Note Call from Patient Call back at Home Phone [phone]   Summary of Call: 2 days ago took bottle of mag citrate & had BM, None today.  After 8 days no smoking, smoked 2 to 3.  Concerned about kidney failure.  Can't feel like she did the last few days.  Needs to talk to Dr. Shela Commons about meds, getting off some, smoking cessation, needs help.  Requests appointment ASAP.   Initial call taken by: Rudy Jew, RN,  December 09, 2009 4:03 PM  Follow-up for Phone Call        push fluids if urinating doubt renal failure set up rov next week too long of agenda for anything short of 30 min Follow-up by: Stacie Glaze MD,  December 09, 2009 4:21 PM  Additional Follow-up for Phone Call Additional follow up Details #1::        Phone Call Completed Additional Follow-up by: Rudy Jew, RN,  December 09, 2009 4:30 PM

## 2010-12-03 NOTE — Progress Notes (Signed)
Summary: more questions  Phone Note Call from Patient   Caller: Patient Call For: Dr. Marina Goodell Summary of Call: Patient wanted to discuss further with you regarding the medication that was given at her last visit by [facility].  She doesn't understand why it was given.  She decided to leave instead of waiting for you to return back into the room.  She asked if you would call her regarding this issue.  Was told we would let you know.   Initial call taken by: Milford Cage [place],  October 12, 2010 4:06 PM  Follow-up for Phone Call        I have already discussed this with her. She can review information on line. I offered her tertiary care referral. Quite frankly, she abuses her time and never seems to get completely satisfactory answers. Follow-up by: Hilarie Fredrickson MD,  October 12, 2010 4:13 PM

## 2010-12-03 NOTE — Progress Notes (Signed)
Summary: wants dil. done sooner  Phone Note Call from Patient   Caller: mother [name] [phone] Call For: Dr. Marina Goodell Summary of Call: Is sch'd for her endo 11-03-10 and requesting sooner appt. b/c she is having alot of problems and cannot eat anything Initial call taken by: Karna Christmas,  October 13, 2010 9:08 AM  Follow-up for Phone Call        There is no sooner openings for a propofol case.Please advise. Follow-up by: Teryl Lucy RN,  October 13, 2010 9:15 AM  Additional Follow-up for Phone Call Additional follow up Details #1::        Can call if something opens up, but unlikely to occur before 1-3 Additional Follow-up by: Hilarie Fredrickson MD,  October 13, 2010 9:30 AM    Additional Follow-up for Phone Call Additional follow up Details #2::    Mother says pt. can only tolerate baby food and she doesn't want to go thru Christmas again like this and pt. has applied for another job and if she gets it that Jan. 3 ,2012 the day she is scheduled  for procedure will be her first day at work.. Asking if Dr.Perry would approve another Dr. in the practice doing it eralier.. Follow-up by: Teryl Lucy RN,  October 13, 2010 10:45 AM  Additional Follow-up for Phone Call Additional follow up Details #3:: Details for Additional Follow-up Action Taken: Sure, please check and see if the hospital Docs the next two weeks are willing and able. The patient would need to be done w/ propofol. If they can, I will fill them in on the patient's case history. Otherwise keep 11/03/10 appt. Hilarie Fredrickson MD  October 13, 2010 10:50 AM DrJacobs -can you do this pt's dil this Thursday if there is room for a propofol case?   I know her from previous interactions, have reviewed recent EGDs and office notes.  I don't see the medical necessity to do this sooner than already scheduled.   I spoke with Dr Arlyce Dice as well. He is unable to work her in. Hilarie Fredrickson MD  October 13, 2010 1:38 PM     Additional Follow-up  by: Rachael Fee MD,  October 13, 2010 12:56 PM  Mother ntfd. that neither hospital dr. can work pt. in earlier.

## 2010-12-03 NOTE — Procedures (Signed)
Summary: Upper Endoscopy  Patient: Gina [name] Note: All result statuses are Final unless otherwise noted.  Tests: (1) Upper Endoscopy (EGD)   EGD Upper Endoscopy       DONE     [facility]     [address] [place], [place]  [address]           ENDOSCOPY PROCEDURE REPORT           PATIENT:  Gina [name], Gina [name]  MR#:  [id number]     BIRTHDATE:  1976/06/12, 34 yrs. old  GENDER:  female           ENDOSCOPIST:  Wilhemina Bonito. Eda Keys, MD     Referred by:  Office           PROCEDURE DATE:  05/27/2010     PROCEDURE:  EGD with dilatation over guidewire -18mm     Flouroscopic assistance for     dilation     ASA CLASS:  Class II     INDICATIONS:  dysphagia, dilation of esophageal ring / stricture -     last exam and dilation to 3F 10-2009           MEDICATIONS:   MAC sedation, administered by CRNA     TOPICAL ANESTHETIC:           DESCRIPTION OF PROCEDURE:   After the risks benefits and     alternatives of the procedure were thoroughly explained, informed     consent was obtained.  The Gastroscope [id number]     endoscope was introduced through the mouth and advanced to the     second portion of the duodenum, without limitations.  The     instrument was slowly withdrawn as the mucosa was fully examined.     <<PROCEDUREIMAGES>>           The upper, middle, and distal third of the esophagus were     carefully inspected and no abnormalities were noted. The z-line     was well seen at the GEJ. No apparrent rings or stricture. The     endoscope was pushed into the fundus which was normal including a     retroflexed view. The antrum,gastric body, first and second part     of the duodenum were unremarkable.    Retroflexed views revealed     no abnormalities.           THERAPY: SAVARY GUIDEWIRE PLACED IN GASTRIC ANTRUM AND     FLUOROSCOPIC CONTROL. SUBSEQUENTLY PASSED 18 MM DILATOR OVER WIRE     UTILIZING FLUORO THROUGHOUT. NO RESISTANCE OR HEME. RELOOK EGD     WITH SUPERFICIAL  MUCOSAL TRAUMA ONLY     COMPLICATIONS:  None           ENDOSCOPIC IMPRESSION:     1) Normal EGD     2) Empiric dilation by chance subtle ring or stricture not     appreciated     3) Swallowing complaints disproportional to endoscopic findings           RECOMMENDATIONS:     1) Clear liquids until 2pm, then soft foods rest of day. Resume     prior diet tomorrow.     2) Continue PPI therapy     3) SEE PCP or Psychiatrist to help with anxiety issues     4)GI follow up prn           ______________________________     Jonny Ruiz  Lanelle Bal, MD           CC:  Stacie Glaze, MD, The Patient           n.     eSIGNEDWilhemina Bonito. Eda Keys at 05/27/2010 11:50 AM           [name], [id number]  Note: An exclamation mark (!) indicates a result that was not dispersed into the flowsheet. Document Creation Date: 05/27/2010 11:52 AM _______________________________________________________________________  (1) Order result status: Final Collection or observation date-time: 05/27/2010 11:30 Requested date-time:  Receipt date-time:  Reported date-time:  Referring Physician:   Ordering Physician: Fransico Setters [id number]) Specimen Source:  Source: Launa Grill Order Number: [id number] Lab site:

## 2010-12-03 NOTE — Assessment & Plan Note (Signed)
Summary: F/U POST ER VISIT // RS   Vital Signs:  Patient profile:   35 year old female Height:      67 inches Weight:      142 pounds BMI:     22.32 Temp:     98.2 degrees F oral Pulse rate:   80 / minute Resp:     14 per minute BP sitting:   100 / 76  (left arm)  Vitals Entered By: Willy Eddy, LPN (June 19, 2010 4:16 PM) CC: roa Is Patient Diabetic? No   Primary Care Provider:  [name], MD   CC:  roa.  History of Present Illness: review of the ct discusion of the culture  vaginosis  dx the pt has a cough and the zpack has not completely  solved the issues smoking cessaton discussed with plan to quit ( comteplative) mother present and participated in the discussion I have spent greater that 30 min face to face evaluating this patient   Preventive Screening-Counseling & Management  Alcohol-Tobacco     Smoking Status: current     Smoking Cessation Counseling: yes     Smoke Cessation Stage: ready     Packs/Day: 1.0     Tobacco Counseling: to quit use of tobacco products  Problems Prior to Update: 1)  Lung Nodule  (ICD-518.89) 2)  Fatigue  (ICD-780.79) 3)  Vaginitis  (ICD-616.10) 4)  Cough  (ICD-786.2) 5)  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 One Tablet By Mouth Six  Times A Day 2)  Lexapro 20 Mg Tabs (Escitalopram Oxalate) .... One Tablet By Mouth Once Daily 3)  Oxycodone-Acetaminophen 10-325 Mg Tabs (Oxycodone-Acetaminophen) .... One By Mouth Q 4 Hours As Needed Not To Exceed 5 A Day 4)  Prevacid Solutab 30 Mg Tbdp (Lansoprazole) .... One By Mouth Once Daily As Needed 5)  Zovirax 5 % Crea (Acyclovir) .... Apply Three Times A Day To Affected 6)  Azithromycin 250 Mg Tabs (Azithromycin) .... 2 Tabs By Mouth Once and Then 1 Tab By Mouth  Once Daily X 4d 7)  Diflucan 150 Mg Tabs (Fluconazole) .Marland Kitchen.. 1 Tab By Mouth Q Wk X 3 Wk 8)  Zovirax 400 Mg Tabs (Acyclovir) .Marland Kitchen.. 1 Tab By Mouth 5 X Daily X 7 Days  Current Medications (verified): 1)  Alprazolam 0.5 Mg Tabs (Alprazolam) .... One Tablet By Mouth Six  Times A Day 2)  Lexapro 20 Mg Tabs (Escitalopram Oxalate) .... One Tablet By Mouth Once Daily 3)  Oxycodone-Acetaminophen 10-325 Mg Tabs (Oxycodone-Acetaminophen) .... One By Mouth Q 4 Hours As Needed Not To Exceed 5 A Day 4)  Prevacid Solutab 30 Mg Tbdp (Lansoprazole) .... One By Mouth Once Daily As Needed 5)  Azithromycin 250 Mg Tabs (Azithromycin) .... 2 Tabs By Mouth Once and Then 1 Tab By Mouth Once Daily X 4d 6)  Diflucan 150 Mg Tabs (Fluconazole) .Marland Kitchen.. 1 Tab By Mouth Q Wk X 3 Wk 7)  Flagyl 375 Mg Caps (Metronidazole) .... One By Mouth Two Times A Day  Allergies (verified): 1)  ! Zofran (Ondansetron Hcl) 2)  ! Nicoderm Cq (Nicotine) 3)  ! * Lomictal 4)  Chantix (Varenicline Tartrate)  Past History:  Family History: Last updated: 09/19/2009 Father with first MI at 65, mother with MV prolapse, otherwise negative No FH of Colon Cancer: Family History of Liver Cancer:MGGF Family History of Colon Polyps:Father and MGM Family History of Diabetes: Father, MGM, and MGF Family History of Heart Disease: MGGM Family History of Irritable Bowel Syndrome:Mother   Social History: Last updated: 09/19/2009 Lives with her husband, [name], and her 69 yo daughter, [name]. 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  Alcohol Use - no Daily Caffeine Use: 4-5 daily  Illicit Drug Use - no  Risk Factors: Smoking Status: current (06/19/2010) Packs/Day: 1.0 (06/19/2010)  Past medical, surgical, family and social histories (including risk factors) reviewed, and no changes noted (except as noted below). Past medical, surgical, family and social histories (including risk factors) reviewed for relevance to current acute and chronic  problems.  Past Medical History: Reviewed history from 09/19/2009 and no changes required. h/o hives Allergic rhinitis bipolar Anxiety Disorder Arrhythmia Arthritis Chronic Headaches Irritable Bowel Syndrome Urinary Tract Infection  Past Surgical History: Reviewed history from 09/19/2009 and no changes required. c-section, repeat - 09/01/2001 Hernia Surgery x 3 Hysterectomy Endome  Family History: Reviewed history from 09/19/2009 and no changes required. Father with first MI at 83, mother with MV prolapse, otherwise negative No FH of Colon Cancer: Family History of Liver Cancer:MGGF Family History of Colon Polyps:Father and MGM Family History of Diabetes: Father, MGM, and MGF Family History of Heart Disease: MGGM Family History of Irritable Bowel Syndrome:Mother   Social History: Reviewed history from 09/19/2009 and no changes required. Lives with her husband, [name], and her 12 yo daughter, [name]. 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  Alcohol Use - no Daily Caffeine Use: 4-5 daily  Illicit Drug Use - no  Packs/Day:  1.0  Review of Systems  The patient denies anorexia, fever, weight loss, weight gain, vision loss, decreased hearing, hoarseness, chest pain, syncope, dyspnea on exertion, peripheral edema, prolonged cough, headaches, hemoptysis, abdominal pain, melena, hematochezia, severe indigestion/heartburn, hematuria, incontinence, genital sores, muscle weakness, suspicious skin lesions, transient blindness, difficulty walking, depression, unusual weight change, abnormal bleeding, enlarged lymph nodes, angioedema, and breast masses.    Physical Exam  General:  Well-developed,well-nourished,in no acute distress; alert,appropriate and cooperative throughout examination Head:  Normocephalic and atraumatic without obvious abnormalities. No apparent alopecia or balding. Eyes:  pupils equal and pupils round.   Ears:  External ear exam shows no significant lesions  or deformities.  Otoscopic examination reveals clear canals, tympanic membranes are intact bilaterally without bulging, retraction, inflammation or discharge. Hearing is grossly normal bilaterally. Nose:  External nasal examination shows no deformity or inflammation. Nasal mucosa are pink and moist without lesions or exudates. Mouth:  Mild erythema of posterior oropharynx, dry coated tongue Neck:  No deformities, masses, or tenderness noted. Lungs:  Normal respiratory effort, chest expands symmetrically. Lungs are clear to auscultation, no crackles or wheezes. Heart:  Normal rate and regular rhythm. S1 and S2 normal without gallop, murmur, click, rub or other extra sounds.   Impression & Recommendations:  Problem # 1:  VAGINITIS (ICD-616.10)  Her updated medication list for this problem includes:    Azithromycin 250 Mg Tabs (Azithromycin) .Marland Kitchen... 2 tabs by mouth once and then 1 tab by mouth once daily x 4d    Flagyl 375 Mg Caps (Metronidazole) ..... One by mouth two times a day  Discussed symptomatic relief and treatment options.   Problem # 2:  COUGH (ICD-786.2)  Problem # 3:  TOBACCO ABUSE (ICD-305.1) Assessment: Unchanged pt wants to quit Encouraged smoking cessation and discussed different methods for smoking cessation.   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 One tablet by mouth six  times a day    Lexapro 20 Mg Tabs (Escitalopram oxalate) ..... One tablet by mouth once daily  Complete Medication List: 1)  Alprazolam 0.5 Mg Tabs (Alprazolam) .... One tablet by mouth six  times a day 2)  Lexapro 20 Mg Tabs (Escitalopram oxalate) .... One tablet by mouth once daily 3)  Oxycodone-acetaminophen 10-325 Mg Tabs (Oxycodone-acetaminophen) .... One by mouth q 4 hours as needed not to exceed 5 a day 4)  Prevacid Solutab 30 Mg Tbdp  (Lansoprazole) .... One by mouth once daily as needed 5)  Azithromycin 250 Mg Tabs (Azithromycin) .... 2 tabs by mouth once and then 1 tab by mouth once daily x 4d 6)  Diflucan 150 Mg Tabs (Fluconazole) .Marland Kitchen.. 1 tab by mouth q wk x 3 wk 7)  Flagyl 375 Mg Caps (Metronidazole) .... One by mouth two times a day  Patient Instructions: 1)  30/day 2)  cut by 2 per day every 5 days until stops 3)  28, 26, 24, 22,.... every 5 days 4)  Please schedule a follow-up appointment in 2 months. Prescriptions: FLAGYL 375 MG CAPS (METRONIDAZOLE) one by mouth two times a day  #14 x 0   Entered and Authorized by:   Stacie Glaze MD   Signed by:   Stacie Glaze MD on 06/19/2010   Method used:   Electronically to        [facility]. [phone]* (retail)  [address]       [place], [place]  [address]       Ph: [phone]       Fax: [phone]   RxID:   [id number]

## 2010-12-03 NOTE — Letter (Signed)
Summary: [facility]  [facility]   Imported By: Lennie Odor 04/15/2010 16:43:42  _____________________________________________________________________  External Attachment:    Type:   Image     Comment:   External Document

## 2010-12-03 NOTE — Progress Notes (Signed)
Summary: allergy  Phone Note Call from Patient   Caller: Patient Call For: Stacie Glaze MD Reason for Call: Talk to Nurse Summary of Call: Pt called in to speak with Dr Lovell Sheehan to adv that she is still exp vaginal d/c and is exp pain in vaginal area - also exp abdominal pain..... What to do?     Also,  pt is exp severe allergy sxs and wants to know what she can do for same?   Pt c/o sxs:  runny nose, dry hacking cough, watery eyes, sinus pressure.... Has tried Scientist, water quality, Mucinex, Sudafed, Benedryl, Zyrtec  (all otc meds) but wants to know if there is a Rx she could try to help alieviate sxs...Marland KitchenMarland KitchenNeed for OV?    Pt can be reached at [phone].    Initial call taken by: Debbra Riding,  June 25, 2010 10:37 AM  Follow-up for Phone Call        per dr Lovell Sheehan- needs to go to gyn for vaginal problems and can try nasonex  2 sprays once daily for allergies Follow-up by: Willy Eddy, LPN,  June 25, 2010 10:53 AM  Additional Follow-up for Phone Call Additional follow up Details #1::        Any nasal sprays "freak her out". Additional Follow-up by: Lynann Beaver CMA,  June 25, 2010 3:09 PM    Additional Follow-up for Phone Call Additional follow up Details #2::    per dr Cristal Ford referr to ent Follow-up by: Willy Eddy, LPN,  June 26, 2010 10:45 AM  Additional Follow-up for Phone Call Additional follow up Details #3:: Details for Additional Follow-up Action Taken: Is a little better today.  Please go ahead with referral.   Additional Follow-up by: Rudy Jew, RN,  June 26, 2010 11:16 AM

## 2010-12-03 NOTE — Progress Notes (Signed)
Summary: pressure in head and script refill of Oxycodone.  Phone Note Call from Patient Call back at Home Phone [phone]   Caller: Patient Call For: Stacie Glaze MD Summary of Call: Pt is asking to see Dr. Lovell Sheehan tomorrow for "pressure in head".  Insisisting she be seen.  [phone] Initial call taken by: Lynann Beaver CMA,  May 05, 2010 12:45 PM  Follow-up for Phone Call        dr Lovell Sheehan has no avaliabilty this week offer appointmentt with any  one or er/ urgent care [facility] if symptoms warrent Follow-up by: Stacie Glaze MD,  May 06, 2010 7:51 AM  Additional Follow-up for Phone Call Additional follow up Details #1::        Pt has been notified of the info noted above. Pt req refill of Oxycodone 10-325mg  5 times a day.   Pt wants to pick this up today.  Additional Follow-up by: Lucy Antigua,  May 06, 2010 10:42 AM    Prescriptions: OXYCODONE-ACETAMINOPHEN 10-325 MG TABS (OXYCODONE-ACETAMINOPHEN) one by mouth q 4 hours as needed not to exceed 5 a day  #100 x 0   Entered by:   Willy Eddy, LPN   Authorized by:   Stacie Glaze MD   Signed by:   Willy Eddy, LPN on 62/13/0865   Method used:   Print then Give to Patient   RxID:   [id number]

## 2010-12-03 NOTE — Progress Notes (Signed)
Summary: Pt req work in appt today with Dr. Lovell Sheehan only.Went to ER   Phone Note Call from Patient Call back at [facility] Phone [phone]   Caller: Patient Reason for Call: Acute Illness, Talk to Nurse Summary of Call: Pt called and went to ER last night for really bad back pain and problems with throat. Req work in appt with Dr. Lovell Sheehan. Pt refuses to see another Dr.     Initial call taken by: Lucy Antigua,  November 06, 2010 9:20 AM  Follow-up for Phone Call        dr Lovell Sheehan does not have anything at all-will need to see another md-- no need for her to refuse she will have to see another md! Follow-up by: Willy Eddy, LPN,  November 06, 2010 9:26 AM  Additional Follow-up for Phone Call Additional follow up Details #1::        Lft  pt vm that Dr Lovell Sheehan does not have any work in appts available, and that she would have to see another dr.  Additional Follow-up by: Lucy Antigua,  November 06, 2010 1:25 PM

## 2010-12-03 NOTE — Progress Notes (Signed)
Summary: Pt req script for Oxycodone 10-325mg   Phone Note Refill Request Call back at Home Phone [phone] Message from:  Patient on September 03, 2010 9:06 AM  Refills Requested: Medication #1:  OXYCODONE-ACETAMINOPHEN 10-325 MG TABS one by mouth q 4 hours as needed not to exceed 5 a day   Dosage confirmed as above?Dosage Confirmed  Method Requested: Pick up at Office Initial call taken by: Lucy Antigua,  September 03, 2010 9:06 AM    Prescriptions: OXYCODONE-ACETAMINOPHEN 10-325 MG TABS (OXYCODONE-ACETAMINOPHEN) one by mouth q 4 hours as needed not to exceed 5 a day  #100 x 0   Entered by:   Willy Eddy, LPN   Authorized by:   Stacie Glaze MD   Signed by:   Willy Eddy, LPN on 36/64/4034   Method used:   Print then Give to Patient   RxID:   [id number]

## 2010-12-03 NOTE — Assessment & Plan Note (Signed)
Summary: TOO MANY MEDS, GET OFF SOME, SMOKING CESSATION/KIDNEY FAILURE...   Vital Signs:  Patient profile:   35 year old female Height:      67 inches Temp:     98.2 degrees F rectal Pulse rate:   72 / minute Resp:     12 per minute BP sitting:   110 / 70  (left arm)  Vitals Entered By: Willy Eddy, LPN (December 16, 2009 3:41 PM) CC: multiple complaints   Primary Care Provider:  [name], MD   CC:  multiple complaints.  History of Present Illness: had dysphagia ( family hx in that her mother has dysphagia) had a pain injecton in the back and this nhas helped pain control seeing the therapist sees the GYN for the abdominal pain ( has a hx of ruptured ovarian cyst with pain) hx of endometriosis. was constipated with ER visit with obstipation. 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 One Tablet By Mouth Five Times A Day 2)  Lexapro 10 Mg Tabs (Escitalopram Oxalate) .... Titrating Up To 20 Once Daily 3)  Oxycodone-Acetaminophen 10-325 Mg Tabs (Oxycodone-Acetaminophen) .... One By Mouth Q 4 Hours As Needed Not To Exceed 5 A Day 4)  Motrin 800 .... As Needed 5)  Pepcid Ac 10 Mg Chew (Famotidine) .Marland Kitchen.. 1 Tablet By Mouth Once Daily 6)  Prevacid Solutab 30 Mg Tbdp (Lansoprazole) .... One By Mouth Once Daily  Current Medications (verified): 1)  Alprazolam 0.5 Mg Tabs (Alprazolam) .... One Tablet By Mouth Five Times A Day 2)  Lexapro 10 Mg Tabs (Escitalopram Oxalate) .... Titrating Up To 20 Once Daily 3)  Oxycodone-Acetaminophen 10-325 Mg Tabs (Oxycodone-Acetaminophen) .... One By Mouth Q 4 Hours As Needed Not To Exceed 5 A Day 4)  Motrin 800 .... As Needed 5)  Prevacid Solutab 30 Mg Tbdp (Lansoprazole) .... One By Mouth Once Daily As Needed  Allergies (verified): 1)  ! Zofran (Ondansetron Hcl) 2)  ! Nicoderm Cq (Nicotine) 3)  ! * Lomictal 4)  Chantix (Varenicline Tartrate)  Past History:  Family History: Last updated: 09/19/2009 Father with first MI at 41, mother with MV prolapse, otherwise negative No FH of Colon Cancer: Family History of Liver Cancer:MGGF Family History of Colon Polyps:Father and MGM Family  History of Diabetes: Father, MGM, and MGF Family History of Heart Disease: MGGM Family History of Irritable Bowel Syndrome:Mother   Social History: Last updated: 09/19/2009 Lives with her husband, [name], and her 28 yo daughter, [name]. 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  Alcohol Use - no Daily Caffeine Use: 4-5 daily  Illicit Drug Use - no  Risk Factors: Smoking Status: current (12/16/2009)  Past medical, surgical, family and social histories (including risk factors) reviewed, and no changes noted (except as noted below).  Past Medical History: Reviewed history from 09/19/2009 and no changes required. h/o hives Allergic rhinitis bipolar Anxiety Disorder Arrhythmia Arthritis Chronic Headaches Irritable Bowel Syndrome Urinary Tract Infection  Past Surgical History: Reviewed history from 09/19/2009 and no changes required. c-section, repeat - 09/01/2001 Hernia Surgery x 3 Hysterectomy Endome  Family History: Reviewed history from 09/19/2009 and no changes required. Father with first MI at 26, mother with MV prolapse, otherwise negative No FH of Colon Cancer: Family History of Liver Cancer:MGGF Family History of Colon Polyps:Father and MGM Family History of Diabetes: Father, MGM, and MGF Family History of Heart Disease: MGGM Family History of Irritable Bowel Syndrome:Mother   Social History: Reviewed history from 09/19/2009 and no changes required. Lives with her husband, [name], and her 49 yo daughter, [name]. 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  Alcohol Use - no Daily Caffeine Use: 4-5 daily  Illicit Drug Use - no  Review of Systems  The patient denies anorexia, fever, weight loss, weight gain, vision loss, decreased hearing, hoarseness, chest pain, syncope, dyspnea on exertion, peripheral edema, prolonged cough, headaches, hemoptysis, abdominal pain, melena, hematochezia, severe indigestion/heartburn, hematuria, incontinence, genital sores,  muscle weakness, suspicious skin lesions, transient blindness, difficulty walking, depression, unusual weight change, abnormal bleeding, enlarged lymph nodes, angioedema, and breast masses.    Physical Exam  General:  Well-developed,well-nourished,in no acute distress; alert,appropriate and cooperative throughout examination Head:  normocephalic and no abnormalities observed.   Eyes:  pupils equal and pupils round.   Ears:  R ear normal and L ear normal.   Nose:  no external deformity and no nasal discharge.   Mouth:  Oral mucosa and oropharynx without lesions or exudates.  Teeth in good repair. Neck:  No deformities, masses, or tenderness noted. Lungs:  Normal respiratory effort, chest expands symmetrically. Lungs are clear to auscultation, no crackles or wheezes. Heart:  Normal rate and regular rhythm. S1 and S2 normal without gallop, murmur, click, rub or other extra sounds. Abdomen:  Bowel sounds positive,abdomen soft and non-tender without masses, organomegaly or hernias noted. Msk:  No deformity or scoliosis noted of thoracic or lumbar spine.   Extremities:  No clubbing, cyanosis, edema, or deformity noted with normal full range of motion of all joints.   Neurologic:  No cranial nerve deficits noted. Station and gait are normal. Plantar reflexes are down-going bilaterally. DTRs are symmetrical throughout. Sensory, motor and coordinative functions appear intact.   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   Problem # 4:  ENDOMETRIOSIS (ICD-617.9) discussion of endometriosis and possible  treatment  Problem # 5:  SYMPTOM, PAIN, ABDOMINAL, EPIGASTRIC (ICD-789.06)  Problem # 6:  TOBACCO ABUSE (ICD-305.1) was able to cut out smoking  ofr several days but  resumed smoking after 2 weeks, cold turky.... the withdrawl currently smoking one pack a day   Encouraged smoking cessation and discussed different methods for smoking cessation.   Complete Medication List: 1)  Alprazolam 0.5 Mg Tabs (Alprazolam) .... One tablet by mouth five times a day 2)  Lexapro 10 Mg Tabs (Escitalopram oxalate) .... Titrating up to 20 once daily 3)  Oxycodone-acetaminophen 10-325 Mg Tabs (Oxycodone-acetaminophen) .... One by mouth q 4 hours as needed not to exceed 5 a day 4)  Motrin 800  .... As needed 5)  Prevacid Solutab 30 Mg Tbdp (Lansoprazole) .... One by mouth once daily as needed  Patient Instructions: 1)  slow taper daily to a goal of 15 sigs... when you have been on 15 cigs a day for 2 week  , then second taper over ned week to to 12 cigs a day for 2 weeks 2)  start 14 micrograms on 14 mg patches  for full month 3)  the the 7mg  patch or one month 4)  then stop  if you get the weird feeling place a 1/2 7 mg patch 5)  Please schedule a follow-up appointment in 6-8

## 2010-12-03 NOTE — Progress Notes (Signed)
Summary: REQ FOR REFILL RX (Oxycodone)  Phone Note Refill Request Message from:  Patient on Mar 10, 2010 4:22 PM  Refills Requested: Medication #1:  OXYCODONE-ACETAMINOPHEN 10-325 MG TABS one by mouth q 4 hours as needed not to exceed 5 a day   Dosage confirmed as above?Dosage Confirmed   Notes: Pt can be reached at [phone] when Rx is ready.    Prescriptions: OXYCODONE-ACETAMINOPHEN 10-325 MG TABS (OXYCODONE-ACETAMINOPHEN) one by mouth q 4 hours as needed not to exceed 5 a day  #100 x 0   Entered by:   Willy Eddy, LPN   Authorized by:   Stacie Glaze MD   Signed by:   Willy Eddy, LPN on 09/81/1914   Method used:   Print then Give to Patient   RxID:   [id number]

## 2010-12-03 NOTE — Progress Notes (Signed)
Summary: new rx  Phone Note Call from Patient Call back at Home Phone [phone]   Caller: Patient Call For: dr Lovell Sheehan Reason for Call: Lab or Test Results Summary of Call: pt would like rx for oxycodone 10-325 mg up to 5 pills  a day  Initial call taken by: Heron Sabins,  May 15, 2008 9:56 AM  Follow-up for Phone Call        pt notified script is ready adnout front ready for pick Follow-up by: Willy Eddy, LPN,  May 15, 2008 10:01 AM      Prescriptions: PERCOCET 10-325 MG  TABS (OXYCODONE-ACETAMINOPHEN) 1 q 4 hrs as needed not to exceed 5 a day  #150 x 0   Entered by:   Willy Eddy, LPN   Authorized by:   Stacie Glaze MD   Signed by:   Willy Eddy, LPN on 09/81/1914   Method used:   Print then Give to Patient   RxID:   [id number]

## 2010-12-03 NOTE — Progress Notes (Signed)
Summary: new rx  Phone Note Refill Request Message from:  Patient  Refills Requested: Medication #1:  OXYCODONE-ACETAMINOPHEN 10-325 MG TABS one by mouth q 4 hours as needed not to exceed 5 a day pt needs new rx  Initial call taken by: Heron Sabins,  November 24, 2010 11:54 AM    Prescriptions: OXYCODONE-ACETAMINOPHEN 10-325 MG TABS (OXYCODONE-ACETAMINOPHEN) one by mouth q 4 hours as needed not to exceed 5 a day  #100 x 0   Entered by:   Willy Eddy, LPN   Authorized by:   Stacie Glaze MD   Signed by:   Willy Eddy, LPN on 41/32/4401   Method used:   Print then Give to Patient   RxID:   [id number]

## 2010-12-03 NOTE — Progress Notes (Signed)
Summary: Call-A-Nurse Report   Call-A-Nurse Triage Call Report Triage Record Num: [id number] Operator: Coralee [place] Patient Name: Gina [name] Call Date & Time: 07/27/2010 8:54:25PM Patient Phone: [phone] PCP: [name] Patient Gender: Female PCP Fax : [phone] Patient DOB: 16-May-1976 [facility] Name[facility] Reason for Call: Pt calling about constipation. Ongoing, afebrile. Pt took an enema for 2 days, then Magnesium Citrate yesterday. Small amt of BM last night and again this am. Micah Flesher to [facility] and had x-ray done and reports no obstruction but bowel is full. Advised she could use an enema but to increase fluids, fruits and vegtables. Instructed to f/u with office for an appt. Protocol(s) Used: Constipation Recommended Outcome per Protocol: See Provider within 72 Hours Reason for Outcome: Pain or feeling of pressure with bowel movement AND not previously evaluated or not responding to provider recommended treatment Care Advice:  ~ Decrease intake of binding foods such as cheese, white rice, meats, and bananas.  ~ Try a warm bath or apply a warm, wet towel to rectal area to help relax the muscles in the area.  ~ SYMPTOM / CONDITION MANAGEMENT Constipation Care Measures: - Drink 8 to 10 glasses of liquid per day, more if breastfeeding. - Drink warm water or coffee early in the morning. - Gradually increase dietary fiber (fresh fruits/vegetables, whole grain bread and cereals). - As tolerated, walk 30 minutes at a steady pace daily. - Consider nonprescription stool softeners (Colace) per label, pharmacist or provider recommendations. Stool softners are not habit forming as some stimulant laxatives may become. - Consider nonprescription bulk forming laxatives (such as Metamucil, FiberCon, Citrucel, etc.); follow package directions. - Avoid routine use of strong laxatives/enemas/suppositories unless ordered by provider. - Do not delay having bowel movement when having  urge. - Keep a routine; attempt bowel movement within half hour after a meal or after some exercise.  ~ 07/27/2010 9:07:20PM Page 1 of 1 [email or web address]

## 2010-12-03 NOTE — Progress Notes (Signed)
Summary: REQ FOR REFILL RX (OXYCODONE)  Phone Note Refill Request   Refills Requested: Medication #1:  OXYCODONE-ACETAMINOPHEN 10-325 MG TABS one by mouth q 4 hours as needed not to exceed 5 a day   Notes: Pt can be reached at [phone] when Rx is ready for p/u. Initial call taken by: Debbra Riding,  February 18, 2010 11:53 AM    Prescriptions: OXYCODONE-ACETAMINOPHEN 10-325 MG TABS (OXYCODONE-ACETAMINOPHEN) one by mouth q 4 hours as needed not to exceed 5 a day  #100 x 0   Entered by:   Willy Eddy, LPN   Authorized by:   Stacie Glaze MD   Signed by:   Willy Eddy, LPN on 86/57/8469   Method used:   Print then Give to Patient   RxID:   [id number]

## 2010-12-03 NOTE — Progress Notes (Signed)
  Phone Note Call from Patient   Caller: Mom Summary of Call: her daughter's swallowing is getting worse, but she still manages liquids.  she would like the EGD done sooner than thursday with Dr.  Marina Goodell.  I told her that I would pass the word on to Dr. Marina Goodell. Initial call taken by: Rachael Fee MD,  May 24, 2010 8:55 PM     Appended Document:  Noted.

## 2010-12-03 NOTE — Progress Notes (Signed)
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 [phone]* (retail)       [phone]       [place], [place]  [address]       Ph: [phone]       Fax: [phone]   RxID:   [id number]

## 2010-12-03 NOTE — Progress Notes (Signed)
Summary: ? bout her referral  Phone Note Call from Patient Call back at Home Phone [phone]   Caller: Patient---live call Reason for Call: Acute Illness Summary of Call: wants Bonnye or Dr Lovell Sheehan to call her. Requesting explanation about her Neuro doctor @ [facility]. Initial call taken by: Warnell Forester,  Mar 04, 2010 1:07 PM  Follow-up for Phone Call        left message on machine per dr Lovell Sheehan- cant find cause of pain, maybe coming from nerves around incision site, therefore sending you to neurologist to try to find cause nad [facility]  Follow-up by: Willy Eddy, LPN,  Mar 04, 980 1:31 PM

## 2010-12-03 NOTE — Assessment & Plan Note (Signed)
Summary: roa/bmw   Vital Signs:  Patient profile:   35 year old female Height:      67 inches Weight:      136 pounds BMI:     21.38 Temp:     98.2 degrees F oral Pulse rate:   72 / minute Resp:     14 per minute BP sitting:   100 / 60  (left arm)  Vitals Entered By: Willy Eddy, LPN (February 27, 2010 3:36 PM) CC: c/o abd pain around navel   Primary Care Provider:  [name], MD   CC:  c/o abd pain around navel.  History of Present Illness: Pt has bee having increased pain the the abdominal wall she has a hx of mess repair she has a "belly button ring" and has noted some black discharge from the belly button. The area is tender, intermintantly red and she is concerned about infection no trauma pain is more like a pulling sensation she has panic over the hx of the surgical repair for three hernia repairs  she also has OCD and addition to  pain medications has not been able to sleep due to pain has been walking, has been on an incline  Preventive Screening-Counseling & Management  Alcohol-Tobacco     Smoking Status: current  Problems Prior to Update: 1)  Endometriosis  (ICD-617.9) 2)  Gerd  (ICD-530.81) 3)  Palpitations  (ICD-785.1) 4)  Dysphagia  (ICD-787.29) 5)  Dysphagia Unspecified  (ICD-787.20) 6)  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 One Tablet By Mouth Five Times A Day 2)  Lexapro 10 Mg Tabs (Escitalopram Oxalate) .... Titrating Up To 20 Once Daily 3)  Oxycodone-Acetaminophen 10-325 Mg Tabs (Oxycodone-Acetaminophen) .... One By Mouth Q 4 Hours As Needed Not To Exceed 5 A Day 4)  Motrin 800 .... As Needed 5)  Prevacid Solutab 30 Mg Tbdp (Lansoprazole) .... One By Mouth Once Daily As Needed  Current Medications (verified): 1)  Alprazolam 0.5 Mg Tabs (Alprazolam) .... One Tablet By Mouth Five Times  A Day 2)  Lexapro 10 Mg Tabs (Escitalopram Oxalate) .... Titrating Up To 20 Once Daily 3)  Oxycodone-Acetaminophen 10-325 Mg Tabs (Oxycodone-Acetaminophen) .... One By Mouth Q 4 Hours As Needed Not To Exceed 5 A Day 4)  Prevacid Solutab 30 Mg Tbdp (Lansoprazole) .... One By Mouth Once Daily As Needed 5)  Cephalexin 500 Mg Caps (Cephalexin) .... One By Mouth Qid For 10 Days 6)  Flector 1.3 % Ptch (Diclofenac Epolamine) .... Apply To Abdominal Wall Every 12 Hours For Pain  Allergies (verified): 1)  ! Zofran (Ondansetron Hcl) 2)  !  Nicoderm Cq (Nicotine) 3)  ! * Lomictal 4)  Chantix (Varenicline Tartrate)  Past History:  Family History: Last updated: 09/19/2009 Father with first MI at 67, mother with MV prolapse, otherwise negative No FH of Colon Cancer: Family History of Liver Cancer:MGGF Family History of Colon Polyps:Father and MGM Family History of Diabetes: Father, MGM, and MGF Family History of Heart Disease: MGGM Family History of Irritable Bowel Syndrome:Mother   Social History: Last updated: 09/19/2009 Lives with her husband, [name], and her 46 yo daughter, [name]. 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  Alcohol Use - no Daily Caffeine Use: 4-5 daily  Illicit Drug Use - no  Risk Factors: Smoking Status: current (02/27/2010)  Past medical, surgical, family and social histories (including risk factors) reviewed, and no changes noted (except as noted below).  Past Medical History: Reviewed history from 09/19/2009 and no changes required. h/o hives Allergic rhinitis bipolar Anxiety Disorder Arrhythmia Arthritis Chronic Headaches Irritable Bowel Syndrome Urinary Tract Infection  Past Surgical History: Reviewed history from 09/19/2009 and no changes required. c-section, repeat - 09/01/2001 Hernia Surgery x 3 Hysterectomy Endome  Family History: Reviewed history from 09/19/2009 and no changes required. Father with first MI at 64, mother with MV prolapse, otherwise negative No FH of Colon Cancer: Family History of Liver Cancer:MGGF Family History of Colon Polyps:Father and MGM Family History of Diabetes: Father, MGM, and MGF Family History of Heart Disease: MGGM Family History of Irritable Bowel Syndrome:Mother   Social History: Reviewed history from 09/19/2009 and no changes required. Lives with her husband, [name], and her 29 yo daughter, [name]. 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  Alcohol Use - no Daily Caffeine Use: 4-5 daily  Illicit Drug Use - no  Review of  Systems       The patient complains of muscle weakness and depression.  The patient denies anorexia, fever, weight loss, weight gain, vision loss, decreased hearing, hoarseness, chest pain, syncope, dyspnea on exertion, peripheral edema, prolonged cough, headaches, hemoptysis, abdominal pain, melena, hematochezia, severe indigestion/heartburn, hematuria, incontinence, genital sores, suspicious skin lesions, transient blindness, difficulty walking, unusual weight change, abnormal bleeding, enlarged lymph nodes, angioedema, and breast masses.         abdomnla pain  Physical Exam  General:  Well-developed,well-nourished,in no acute distress; alert,appropriate and cooperative throughout examinationuncomfortable-appearing.   Head:  normocephalic and no abnormalities observed.   Eyes:  pupils equal and pupils round.   Nose:  no external deformity and no nasal discharge.   Neck:  No deformities, masses, or tenderness noted. Lungs:  Normal respiratory effort, chest expands symmetrically. Lungs are clear to auscultation, no crackles or wheezes. Heart:  Normal rate and regular rhythm. S1 and S2 normal without gallop, murmur, click, rub or other extra sounds. Abdomen:  epigastric tenderness.  shoddy suculanious nodules in the area of prior mess placement voluntary guarding aroung umbilicus    Impression & Recommendations:  Problem # 1:  SYMPTOM,  PAIN, ABDOMINAL, EPIGASTRIC (ICD-789.06)  Discussed symptom control with the patient.   Orders: Radiology Referral (Radiology) Venipuncture (16109) TLB-CBC Platelet - w/Differential (85025-CBCD)  Complete Medication List: 1)  Alprazolam 0.5 Mg Tabs (Alprazolam) .... One tablet by mouth five times a day 2)  Lexapro 10 Mg Tabs (Escitalopram oxalate) .... Titrating up to 20 once daily 3)  Oxycodone-acetaminophen 10-325 Mg Tabs (Oxycodone-acetaminophen) .... One by mouth q 4 hours as needed not to exceed 5 a day 4)  Prevacid Solutab 30 Mg Tbdp  (Lansoprazole) .... One by mouth once daily as needed 5)  Cephalexin 500 Mg Caps (Cephalexin) .... One by mouth qid for 10 days 6)  Flector 1.3 % Ptch (Diclofenac epolamine) .... Apply to abdominal wall every 12 hours for pain  Patient Instructions: 1)  Take your antibiotic as prescribed until ALL of it is gone, but stop if you develop a rash or swelling and contact our office as soon as possible. Prescriptions: FLECTOR 1.3 % PTCH (DICLOFENAC EPOLAMINE) apply to abdominal wall every 12 hours for pain  #60 x 0   Entered and Authorized by:   Stacie Glaze MD   Signed by:   Stacie Glaze MD on 02/27/2010   Method used:   Electronically to        [facility]. [phone]* (retail)       [phone]       [place], [place]  [address]       Ph: [phone]       Fax: [phone]   RxID:   [id number] CEPHALEXIN 500 MG CAPS (CEPHALEXIN) one by mouth QID for 10 days  #40 x 0   Entered and Authorized by:   Stacie Glaze MD   Signed by:   Stacie Glaze MD on 02/27/2010   Method used:   Electronically to        [facility]. [phone]* (retail)       [phone] [address]       [place], [place]  [address]       Ph: [phone]       Fax: [phone]   RxID:   [id number]

## 2010-12-03 NOTE — Progress Notes (Signed)
Summary: test results  Phone Note Call from Patient Call back at Home Phone [phone]   Caller: Patient--live call Reason for Call: Talk to Nurse, Lab or Test Results Summary of Call: wants Bonnye to return call about test results. Initial call taken by: Warnell Forester,  June 18, 2010 1:19 PM  Follow-up for Phone Call        Dr. Lovell Sheehan, please see the CT scan report and Append. Follow-up by: Lynann Beaver CMA,  June 18, 2010 3:08 PM  Additional Follow-up for Phone Call Additional follow up Details #1::        debbie states she read pt the report Additional Follow-up by: Willy Eddy, LPN,  June 19, 2010 9:06 AM

## 2010-12-03 NOTE — Progress Notes (Signed)
Summary: UTI  Phone Note Call from Patient   Caller: Patient Call For: Stacie Glaze MD Summary of Call: Pt is calling complaining of dysuria and abdomen pain with odor to urine.  Suggested Urgent Care for treatment tonight. Initial call taken by: Lynann Beaver CMA,  November 19, 2009 4:50 PM  Follow-up for Phone Call        triaged appropriately urgent care recommnets called a time of when office had no visits Follow-up by: Stacie Glaze MD,  November 20, 2009 8:09 AM

## 2010-12-03 NOTE — Letter (Signed)
Summary: [facility]Nurse  [facility]Nurse   Imported By: Maryln Gottron 11/27/2009 12:38:13  _____________________________________________________________________  External Attachment:    Type:   Image     Comment:   External Document

## 2010-12-03 NOTE — Progress Notes (Signed)
Summary: has ? for the dr  Phone Note Call from Patient Call back at Home Phone [phone]   Caller: Patient--live call Summary of Call: pt signed up for free yoga class. she asked the instructor should she be in pain afterward. The instructor told her she did not think so. Pt calls today and says that she is in pain and would really like to continue yoga. please afvise and return call.  She is still congested and would like to know if she can take childrens' sudafed? Initial call taken by: Warnell Forester,  June 23, 2010 12:43 PM  Follow-up for Phone Call         per dr Lovell Sheehan - sometimes people with hernia repairs can have pain associated with exercise. dr Lovell Sheehan wants her to tell insturctor of her hernia repairs and ask if she should still take yoga? Follow-up by: Willy Eddy, LPN,  June 23, 2010 3:36 PM  Additional Follow-up for Phone Call Additional follow up Details #1::        children's sudafed?  fine   LMTCB Debby [facility] CMA  June 23, 2010 4:07 PM  Additional Follow-up by: Lynann Beaver CMA,  June 23, 2010 3:37 PM    Additional Follow-up for Phone Call Additional follow up Details #2::    Notified pt. Follow-up by: Lynann Beaver CMA,  June 23, 2010 4:17 PM

## 2010-12-03 NOTE — Progress Notes (Signed)
Summary: refill today please  Phone Note Refill Request Call back at Home Phone [phone] Message from:  Patient----live call  Refills Requested: Medication #1:  OXYCODONE-ACETAMINOPHEN 10-325 MG TABS one by mouth q 4 hours as needed not to exceed 5 a day Needs today. Will run out of meds tomorrow.   Initial call taken by: Warnell Forester,  January 30, 2010 8:45 AM  Follow-up for Phone Call        please call pt and tell her ,she can pick up after 1 pm today-thanks Follow-up by: Willy Eddy, LPN,  January 30, 2010 8:48 AM  Additional Follow-up for Phone Call Additional follow up Details #1::        pt aware. Additional Follow-up by: Warnell Forester,  January 30, 2010 9:17 AM    Prescriptions: OXYCODONE-ACETAMINOPHEN 10-325 MG TABS (OXYCODONE-ACETAMINOPHEN) one by mouth q 4 hours as needed not to exceed 5 a day  #100 x 0   Entered by:   Willy Eddy, LPN   Authorized by:   Stacie Glaze MD   Signed by:   Willy Eddy, LPN on 09/81/1914   Method used:   Print then Give to Patient   RxID:   [id number]

## 2010-12-03 NOTE — Progress Notes (Signed)
Summary: rX rEFILL  Phone Note Refill Request Call back at Home Phone [phone] Message from:  Patient on November 03, 2010 12:16 PM  Refills Requested: Medication #1:  OXYCODONE-ACETAMINOPHEN 10-325 MG TABS one by mouth q 4 hours as needed not to exceed 5 a day Husband to pick up..   Method Requested: Pick up at Office Initial call taken by: Trixie Dredge,  November 03, 2010 12:16 PM Caller: Patient Initial call taken by: Trixie Dredge,  November 03, 2010 12:16 PM    Prescriptions: OXYCODONE-ACETAMINOPHEN 10-325 MG TABS (OXYCODONE-ACETAMINOPHEN) one by mouth q 4 hours as needed not to exceed 5 a day  #100 x 0   Entered by:   Willy Eddy, LPN   Authorized by:   Stacie Glaze MD   Signed by:   Willy Eddy, LPN on 13/06/6577   Method used:   Print then Give to Patient   RxID:   [id number]

## 2010-12-03 NOTE — Progress Notes (Signed)
Summary: med concerns  Phone Note Call from Patient Call back at Home Phone [phone]   Caller: Patient Call For: Stacie Glaze MD Summary of Call: Pt stop smoking 3 days ago she has concerns regarding her meds.  Initial call taken by: Heron Sabins,  December 04, 2009 1:03 PM  Follow-up for Phone Call        does she need to change any medicastion since she stopped smoking>? Follow-up by: Willy Eddy, LPN,  December 04, 2009 1:55 PM  Additional Follow-up for Phone Call Additional follow up Details #1::        actuall her pain meds will start to work better since the liver is not being stimulated by the nicotine Additional Follow-up by: Stacie Glaze MD,  December 05, 2009 9:12 AM    Additional Follow-up for Phone Call Additional follow up Details #2::    left message on machine for pt Follow-up by: Willy Eddy, LPN,  December 05, 2009 1:29 PM

## 2010-12-03 NOTE — Progress Notes (Signed)
Summary: Pt called req refill of Oxycodone  Phone Note Refill Request Call back at Home Phone [phone]   Refills Requested: Medication #1:  OXYCODONE-ACETAMINOPHEN 10-325 MG TABS one by mouth q 4 hours as needed not to exceed 5 a day   Dosage confirmed as above?Dosage Confirmed Oxycodone 10-325mg   # 20   Method Requested: Pick up at Office Initial call taken by: Lucy Antigua,  June 15, 2010 11:03 AM    Prescriptions: OXYCODONE-ACETAMINOPHEN 10-325 MG TABS (OXYCODONE-ACETAMINOPHEN) one by mouth q 4 hours as needed not to exceed 5 a day  #100 x 0   Entered by:   Willy Eddy, LPN   Authorized by:   Stacie Glaze MD   Signed by:   Willy Eddy, LPN on 09/81/1914   Method used:   Print then Give to Patient   RxID:   [id number]  +++

## 2010-12-03 NOTE — Progress Notes (Signed)
Summary: REFILL REQUEST  Phone Note Refill Request Message from:  Patient on Mar 31, 2010 2:49 PM  Refills Requested: Medication #1:  OXYCODONE-ACETAMINOPHEN 10-325 MG TABS one by mouth q 4 hours as needed not to exceed 5 a day   Notes: Pt can be reached at [phone] when Rx is ready for p/u.    Initial call taken by: Debbra Riding,  Mar 31, 2010 2:49 PM  Follow-up for Phone Call        may fill when bonnye returns in AM Follow-up by: Stacie Glaze MD,  April 01, 2010 8:41 AM  Additional Follow-up for Phone Call Additional follow up Details #1::        Pt called and said that she is completely out of med and can not wait until [place] returns. Pt wants another doctor to write script. [phone] Additional Follow-up by: Lucy Antigua,  April 01, 2010 11:16 AM    Additional Follow-up for Phone Call Additional follow up Details #2::    refill is due 6/1 for 20 day supply may print and sign Follow-up by: Stacie Glaze MD,  April 01, 2010 11:20 AM  Prescriptions: OXYCODONE-ACETAMINOPHEN 10-325 MG TABS (OXYCODONE-ACETAMINOPHEN) one by mouth q 4 hours as needed not to exceed 5 a day  #100 x 0   Entered and Authorized by:   Stacie Glaze MD   Signed by:   Stacie Glaze MD on 04/01/2010   Method used:   Print then Give to Patient   RxID:   [id number]  Pt notified.

## 2010-12-03 NOTE — Progress Notes (Signed)
Summary: REQ FOR REFILL  Phone Note Call from Patient   Caller: Patient [phone] Reason for Call: Refill Medication, Talk to Nurse Summary of Call: Pt called to adv that she would like a refill on med (Oxycodone-acetaminophen 10-325 Mg Tabs).... Pt can be reached at [phone] when same is ready for p/u.  Initial call taken by: Debbra Riding,  November 28, 2009 10:03 AM  Follow-up for Phone Call        rx done Follow-up by: Stacie Glaze MD,  November 28, 2009 12:04 PM    Prescriptions: OXYCODONE-ACETAMINOPHEN 10-325 MG TABS (OXYCODONE-ACETAMINOPHEN) one by mouth q 4 hours as needed not to exceed 5 a day  #100 x 0   Entered by:   Willy Eddy, LPN   Authorized by:   Stacie Glaze MD   Signed by:   Willy Eddy, LPN on 29/56/2130   Method used:   Print then Give to Patient   RxID:   [id number]

## 2010-12-03 NOTE — Assessment & Plan Note (Signed)
Summary: 2 month rov/njr/pt rsc/cjr pt rsc due to taking moviprep/njr   Vital Signs:  Patient profile:   35 year old female Height:      67 inches Weight:      144 pounds BMI:     22.64 Temp:     98.2 degrees F oral Pulse rate:   80 / minute Resp:     14 per minute BP sitting:   130 / 80  (left arm)  Vitals Entered By: Willy Eddy, LPN (October 05, 2010 2:57 PM) CC: 2 month roa Is Patient Diabetic? No   Primary Care Anyelo Mccue:  [name], MD   CC:  2 month roa.  History of Present Illness: has failed smoking cessation attempts the last time she quit she quit cold [place] but has withdrawl 4 days out she is very concerned about smoking she has tried the 14 mg patches ( the 21 mg patches gave her palpitations) she states that she smokes 30 cigs a day ( 1.5) the main source of her stress is Donnald Garre is not taking medicatins are scheduled  Preventive Screening-Counseling & Management  Alcohol-Tobacco     Smoking Status: current     Smoking Cessation Counseling: yes     Smoke Cessation Stage: ready     Packs/Day: 1.0     Tobacco Counseling: to quit use of tobacco products  Problems Prior to Update: 1)  Constipation  (ICD-564.00) 2)  Anxiety  (ICD-300.00) 3)  Stress Disorder  (ICD-V62.89) 4)  Lung Nodule  (ICD-518.89) 5)  Fatigue  (ICD-780.79) 6)  Vaginitis  (ICD-616.10) 7)  Cough  (ICD-786.2) 8)  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 One Tablet By Mouth Six  Times A Day 2)  Lexapro 20 Mg Tabs (Escitalopram Oxalate) .... One Tablet By Mouth Once Daily 3)  Oxycodone-Acetaminophen 10-325 Mg Tabs (Oxycodone-Acetaminophen) .... One By Mouth  Q 4 Hours As Needed Not To Exceed 5 A Day 4)  Prevacid Solutab 30 Mg Tbdp (Lansoprazole) .... One By Mouth Once Daily As Needed 5)  Flexeril 10 Mg Tabs (Cyclobenzaprine Hcl) .Marland Kitchen.. 1 Two Times A Day As Needed 6)  Magnesium Citrate 1.745 Gm/1ml Soln (Magnesium Citrate) .... As Needed 7)  Fleet Enema 7-19 Gm/185ml Enem (Sodium Phosphates) .... As Needed 8)  Ensure  Liqd (Nutritional Supplements) .... Once Daily 9)  Flovent Hfa 110 Mcg/act Aero (Fluticasone Propionate  Hfa) .... 2 Sprays in The Mouth Twice Daily, Rinse The Mouth With Water and Spitthen Nothing By Mouth For 30 Min .afterwards, 10)  Golytely 236 Gm Solr (Peg 3350-Kcl-Nabcb-Nacl-Nasulf) .... Use As Directed.  Current Medications (verified): 1)  Alprazolam 0.5 Mg Tabs (Alprazolam) .... One Tablet By Mouth Six  Times A Day 2)  Lexapro 20 Mg Tabs (Escitalopram Oxalate) .... One Tablet By Mouth Once Daily 3)  Oxycodone-Acetaminophen 10-325 Mg Tabs (Oxycodone-Acetaminophen) .... One By Mouth Q 4 Hours As Needed Not To Exceed 5 A Day 4)  Prevacid Solutab 30 Mg Tbdp (Lansoprazole) .... One By Mouth Once Daily As Needed 5)  Flexeril 10 Mg Tabs (Cyclobenzaprine Hcl) .Marland Kitchen.. 1 Two Times A Day As Needed 6)  Magnesium Citrate 1.745 Gm/66ml Soln (Magnesium Citrate) .... As Needed 7)  Fleet Enema 7-19 Gm/196ml Enem (Sodium Phosphates) .... As Needed 8)  Ensure  Liqd (Nutritional Supplements) .... Once Daily 9)  Flovent Hfa 110 Mcg/act Aero (Fluticasone Propionate  Hfa) .... 2 Sprays in The Mouth Twice Daily, Rinse The Mouth With Water and Spitthen Nothing By Mouth For 30 Min .afterwards,  Allergies (verified): 1)  ! Zofran (Ondansetron Hcl) 2)  ! Nicoderm Cq (Nicotine) 3)  ! * Lomictal 4)  Chantix (Varenicline Tartrate)  Past History:  Family History: Last updated: 09/19/2009 Father with first MI at 60, mother with MV prolapse, otherwise negative No FH of Colon Cancer: Family History of Liver Cancer:MGGF Family History of Colon  Polyps:Father and MGM Family History of Diabetes: Father, MGM, and MGF Family History of Heart Disease: MGGM Family History of Irritable Bowel Syndrome:Mother   Social History: Last updated: 09/15/2010 Lives with her husband, [name], and her 48 yo daughter, [name]. 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  Alcohol Use - no Daily Caffeine Use: 4-5 daily  Illicit Drug Use - no  Risk Factors: Smoking Status: current (10/05/2010) Packs/Day: 1.0 (10/05/2010)  Past medical, surgical, family and social histories (including risk factors) reviewed, and no changes noted (except as noted below).  Past Medical History: Reviewed history from 09/15/2010 and no changes required. h/o hives Allergic rhinitis bipolar Anxiety Disorder Arrhythmia Arthritis Chronic Headaches Irritable Bowel Syndrome Urinary Tract Infection GERD  Past Surgical History: Reviewed history from 09/19/2009 and no changes required. c-section, repeat - 09/01/2001 Hernia Surgery x 3 Hysterectomy Endome  Family History: Reviewed history from 09/19/2009 and no changes required. Father with first MI at 51, mother with MV prolapse, otherwise  negative No FH of Colon Cancer: Family History of Liver Cancer:MGGF Family History of Colon Polyps:Father and MGM Family History of Diabetes: Father, MGM, and MGF Family History of Heart Disease: MGGM Family History of Irritable Bowel Syndrome:Mother   Social History: Reviewed history from 09/15/2010 and no changes required. Lives with her husband, [name], and her 25 yo daughter, [name]. 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  Alcohol Use - no Daily Caffeine Use: 4-5 daily  Illicit Drug Use - no  Review of Systems  The patient denies anorexia, fever, weight loss, weight gain, vision loss, decreased hearing, hoarseness, chest pain, syncope, dyspnea on exertion, peripheral edema, prolonged cough, headaches, hemoptysis, abdominal pain, melena, hematochezia, severe  indigestion/heartburn, hematuria, incontinence, genital sores, muscle weakness, suspicious skin lesions, transient blindness, difficulty walking, depression, unusual weight change, abnormal bleeding, enlarged lymph nodes, angioedema, and breast masses.    Physical Exam  General:  Well-developed,well-nourished,in no acute distress; alert,appropriate and cooperative throughout examination Head:  Normocephalic and atraumatic without obvious abnormalities. No apparent alopecia or balding. Eyes:  pupils equal and pupils round.   Ears:  External ear exam shows no significant lesions or deformities.  Otoscopic examination reveals clear canals, tympanic membranes are intact bilaterally without bulging, retraction, inflammation or discharge. Hearing is grossly normal bilaterally. Nose:  External nasal examination shows no deformity or inflammation. Nasal mucosa are pink and moist without lesions or exudates. Neck:  No deformities, masses, or tenderness noted. Lungs:  Normal respiratory effort, chest expands symmetrically. Lungs are clear to auscultation, no crackles or wheezes. Heart:  Normal rate and regular rhythm. S1 and S2 normal without gallop, murmur, click, rub or other extra sounds. Abdomen:  Bowel sounds positive,abdomen soft and non-tender without masses, organomegaly or hernias noted. Msk:  No deformity or scoliosis noted of thoracic or lumbar spine.   Neurologic:  No cranial nerve deficits noted. Station and gait are normal. Plantar reflexes are down-going bilaterally. DTRs are symmetrical throughout. Sensory, motor and coordinative functions appear intact.   Impression & Recommendations:  Problem # 1:  TOBACCO ABUSE (ICD-305.1)  discussion of controlling smoking until the current stressors are reduced cut back to back to betweeo 20- and 10 then consider quiting  Encouraged smoking cessation and discussed different methods for smoking cessation.   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 and there issues has interferred with this  Problem # 3:  GERD (ICD-530.81)  Her updated medication list for this problem includes:    Prevacid Solutab 30 Mg Tbdp (Lansoprazole) ..... One by mouth once daily as needed  EGD: DONE (05/27/2010)  Labs Reviewed: Hgb: 14.0 (06/11/2010)   Hct: 40.8 (06/11/2010)  Problem # 4:  SYMPTOM, PAIN, ABDOMINAL, EPIGASTRIC (ICD-789.06) complicated by smoking... and not take medications are scheduled Discussed symptom control with the patient.   Complete Medication List: 1)  Alprazolam 0.5 Mg Tabs (Alprazolam) .... One tablet by mouth six  times a day 2)  Lexapro 20 Mg Tabs (Escitalopram oxalate) .... One tablet by mouth once daily 3)  Oxycodone-acetaminophen 10-325 Mg Tabs (Oxycodone-acetaminophen) .... One by mouth q 4 hours as needed not to exceed 5 a day 4)  Prevacid Solutab 30 Mg Tbdp (Lansoprazole) .... One by mouth once daily as needed 5)  Flexeril 10 Mg Tabs (Cyclobenzaprine hcl) .Marland Kitchen.. 1 two times a day as needed  6)  Magnesium Citrate 1.745 Gm/71ml Soln (Magnesium citrate) .... As needed 7)  Fleet Enema 7-19 Gm/174ml Enem (Sodium phosphates) .... As needed 8)  Ensure Liqd (Nutritional supplements) .... Once daily 9)  Flovent Hfa 110 Mcg/act Aero (Fluticasone propionate  hfa) .... 2 sprays in the mouth twice daily, rinse the mouth with water and spitthen nothing by mouth for 30 min .afterwards,  Patient Instructions: 1)  Please schedule a follow-up appointment in 3 months. 2)  need to take medications as scheduled   Orders Added: 1)  Est. Patient Level IV [16109]

## 2010-12-09 NOTE — Progress Notes (Signed)
Summary: Call A Nurse  Phone Note Call from Patient   Summary of Call: pt d idnot call back an d unable to contact pt Initial call taken by: Willy Eddy, LPN,  November 30, 2010 3:24 PM     Call-A-Nurse Triage Call Report Triage Record Num: [id number] Operator: Kerby Moors Patient Name: Gina [name] Call Date & Time: 11/28/2010 9:48:56PM Patient Phone: [phone] PCP: [name] Patient Gender: Female PCP Fax : [phone] Patient DOB: 1976-10-28 [facility] Name[facility] Reason for Call: Pt calling about generalized pain all over, "I overdid it..... Feels like I've been in a car wreck." [place]chey, stabbing pains. Back, stomach, and legs hurt and pain meds are not helping. Takes Oxycodone 10/325 4-5x/day, directions q4hrs not to exceed 5doses per day, last dose 2045. Wants to know if ok to take xtra dose. All emergent sxs r/o per Muscle Pain protocol. Advised not to take xtra dose of Oxycodone, other home care suggestions given. Protocol(s) Used: Muscle Pain Recommended Outcome per Protocol: See Provider within 72 Hours Reason for Outcome: Impairs ability to perform activities of daily living (ADLs) Care Advice: Analgesic/Antipyretic Advice - NSAIDs: Consider aspirin, ibuprofen, naproxen or ketoprofen for pain or fever as directed on label or by pharmacist/provider. PRECAUTIONS: - If over 51 years of age, should not take longer than 1 week without consulting provider. EXCEPTIONS: - Should not be used if taking blood thinners or have bleeding problems. - Do not use if have history of sensitivity/allergy to any of these medications; or history of cardiovascular, ulcer, kidney, liver disease or diabetes unless approved by provider. - Do not exceed recommended dose or frequency.  ~ 01/

## 2010-12-09 NOTE — Assessment & Plan Note (Signed)
Summary: ANXIETY // RS   Vital Signs:  Patient profile:   35 year old female Temp:     99.3 degrees F oral BP sitting:   100 / 64  (left arm) Cuff size:   regular  Vitals Entered By: Sid Falcon LPN (November 26, 2010 3:27 PM)  History of Present Illness: Patient is seen as a work in with chief compliant of extreme fatigue.  She has a hx of depression and recent increase in anxiety symptoms.  She is  maintained on Lexapro and Alprazolam.  No suicidal ideation.   Increased stress, esp with home finances.  Fatigue increased over past  several days.  Poor sleep chronically.  Appetite OK and no reported weight loss. No fever, chills, cough, dyspnea, chest pain, dysuria, arthralgias.  Has had some chronic intermittent palpitations.  Ongoing nicotine use. No regular exercise.  Labs in August-CBC, BMP, ESR, and hepatic all normal. Chronic increased caffeine use.  Allergies: 1)  ! Zofran (Ondansetron Hcl) 2)  ! Nicoderm Cq (Nicotine) 3)  ! * Lomictal 4)  Chantix (Varenicline Tartrate)  Past History:  Past Medical History: Last updated: 09/15/2010 h/o hives Allergic rhinitis bipolar Anxiety Disorder Arrhythmia Arthritis Chronic Headaches Irritable Bowel Syndrome Urinary Tract Infection GERD  Social History: Last updated: 09/15/2010 Lives with her husband, [name], and her 60 yo daughter, [name]. 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  Alcohol Use - no Daily Caffeine Use: 4-5 daily  Illicit Drug Use - no PMH reviewed for relevance, PSH reviewed for relevance  Review of Systems      See HPI  Physical Exam  General:  Well-developed,well-nourished,in no acute distress; alert,appropriate and cooperative throughout examination Ears:  External ear exam shows no significant lesions or deformities.  Otoscopic examination reveals clear canals, tympanic membranes are intact bilaterally without bulging, retraction, inflammation or discharge. Hearing is grossly normal  bilaterally. Mouth:  Oral mucosa and oropharynx without lesions or exudates.  Teeth in good repair. Neck:  No deformities, masses, or tenderness noted. Lungs:  Normal respiratory effort, chest expands symmetrically. Lungs are clear to auscultation, no crackles or wheezes. Heart:  Normal rate and regular rhythm. S1 and S2 normal without gallop, murmur, click, rub or other extra sounds. Extremities:  No clubbing, cyanosis, edema, or deformity noted with normal full range of motion of all joints.   Neurologic:  alert & oriented X3 and cranial nerves II-XII intact.     Impression & Recommendations:  Problem # 1:  FATIGUE (ICD-780.79) repeat TSH (not done in over one year).  Suspect multifactorial-stress, poor sleep, lack of exercise. Orders: Specimen Handling (16109) TLB-TSH (Thyroid Stimulating Hormone) (84443-TSH)  Problem # 2:  ANXIETY (ICD-300.00)  Her updated medication list for this problem includes:    Alprazolam 0.5 Mg Tabs (Alprazolam) ..... One tablet by mouth six  times a day    Lexapro 10 Mg Tabs (Escitalopram oxalate) ..... One tablet by mouth once daily  Complete Medication List: 1)  Alprazolam 0.5 Mg Tabs (Alprazolam) .... One tablet by mouth six  times a day 2)  Lexapro 10 Mg Tabs (Escitalopram oxalate) .... One tablet by mouth once daily 3)  Oxycodone-acetaminophen 10-325 Mg Tabs (Oxycodone-acetaminophen) .... One by mouth q 4 hours as needed not to exceed 5 a day 4)  Prevacid Solutab 30 Mg Tbdp (Lansoprazole) .... One by mouth once daily as needed 5)  Flexeril 10 Mg Tabs (Cyclobenzaprine hcl) .Marland Kitchen.. 1 two times a day as needed 6)  Magnesium Citrate 1.745 Gm/51ml Soln (Magnesium citrate) .Marland KitchenMarland KitchenMarland Kitchen  As needed 7)  Fleet Enema 7-19 Gm/129ml Enem (Sodium phosphates) .... As needed 8)  Ensure Liqd (Nutritional supplements) .... Once daily 9)  Flovent Hfa 110 Mcg/act Aero (Fluticasone propionate  hfa) .... Once daily  Patient Instructions: 1)  Get at least 8 hours sleep per  night. 2)  Exercise regularly 3)  Avoid high starch, high sugar foods. 4)  Plenty of noncaffeinated beverages daily.   Orders Added: 1)  Specimen Handling [99000] 2)  TLB-TSH (Thyroid Stimulating Hormone) [84443-TSH] 3)  Est. Patient Level III [04540]

## 2010-12-14 ENCOUNTER — Telehealth: Payer: Self-pay | Admitting: Internal Medicine

## 2010-12-15 ENCOUNTER — Telehealth: Payer: Self-pay | Admitting: Internal Medicine

## 2010-12-15 ENCOUNTER — Other Ambulatory Visit: Payer: Self-pay | Admitting: Internal Medicine

## 2010-12-15 DIAGNOSIS — M5412 Radiculopathy, cervical region: Secondary | ICD-10-CM

## 2010-12-15 MED ORDER — OXYCODONE-ACETAMINOPHEN 10-325 MG PO TABS: 1.0000 | ORAL_TABLET | ORAL | Status: DC | PRN

## 2010-12-15 NOTE — Telephone Encounter (Signed)
Pt is completely of Oxycodone 10-325mg  5 times a day. Pls call today when this is ready for pick.

## 2010-12-15 NOTE — Telephone Encounter (Signed)
Pt informed ready for pick up 

## 2010-12-23 NOTE — Progress Notes (Signed)
Summary: Switch from Dr Marina Goodell to Dr Leone Payor  Phone Note From Other Clinic   Caller: Clydie Braun @ Dr Jennette Kettle [phone] Call For: Dr Marina Goodell Summary of Call: Personality clash with Dr Marina Goodell. Would like to switch to Dr Leone Payor.  Initial call taken by: Leanor Kail [facility],  December 14, 2010 10:09 AM  Follow-up for Phone Call        Per Dr. Donnetta Hail office patient is an established patient with Dr. Marina Goodell and would like to switch to Dr. Leone Payor. States there is a personality confilct with Dr. Marina Goodell. Unable to find any records in [place], [place], or [place]. Dr. Marina Goodell are you ok with patient seeing Dr. Leone Payor? Follow-up by: Selinda Michaels RN,  December 14, 2010 10:31 AM  Additional Follow-up for Phone Call Additional follow up Details #1::        She had been going by Gina [name] and has an extensive chart. Please make sure that there is one medical record, under one name Gina [name] can help). Unfortunately, she has had difficulties interacting with multiple staff members as well. At this point, I would recommend that she establish with another GI group. Probably best for all parties. We would be happy to assist with medical records release. Additional Follow-up by: Hilarie Fredrickson MD,  December 14, 2010 11:56 AM    Additional Follow-up for Phone Call Additional follow up Details #2::    Called and left message for Clydie Braun regarding Dr. Lamar Sprinkles recommendations. Instructed her to call the office back if there were any further questions. Follow-up by: Selinda Michaels RN,  December 14, 2010 12:03 PM

## 2010-12-26 ENCOUNTER — Emergency Department (HOSPITAL_BASED_OUTPATIENT_CLINIC_OR_DEPARTMENT_OTHER)
Admission: EM | Admit: 2010-12-26 | Discharge: 2010-12-26 | Disposition: A | Discharge status: Home / Self Care | Payer: PRIVATE HEALTH INSURANCE | Attending: Emergency Medicine | Admitting: Emergency Medicine

## 2010-12-26 ENCOUNTER — Emergency Department (INDEPENDENT_AMBULATORY_CARE_PROVIDER_SITE_OTHER): Payer: PRIVATE HEALTH INSURANCE

## 2010-12-26 DIAGNOSIS — R0602 Shortness of breath: Secondary | ICD-10-CM

## 2010-12-26 DIAGNOSIS — F17200 Nicotine dependence, unspecified, uncomplicated: Secondary | ICD-10-CM | POA: Insufficient documentation

## 2010-12-26 DIAGNOSIS — K589 Irritable bowel syndrome without diarrhea: Secondary | ICD-10-CM | POA: Insufficient documentation

## 2010-12-26 DIAGNOSIS — G8929 Other chronic pain: Secondary | ICD-10-CM | POA: Insufficient documentation

## 2010-12-26 DIAGNOSIS — M549 Dorsalgia, unspecified: Secondary | ICD-10-CM

## 2010-12-26 DIAGNOSIS — R0789 Other chest pain: Secondary | ICD-10-CM | POA: Insufficient documentation

## 2010-12-26 DIAGNOSIS — K644 Residual hemorrhoidal skin tags: Secondary | ICD-10-CM | POA: Insufficient documentation

## 2010-12-26 DIAGNOSIS — K5900 Constipation, unspecified: Secondary | ICD-10-CM | POA: Insufficient documentation

## 2010-12-26 DIAGNOSIS — R079 Chest pain, unspecified: Secondary | ICD-10-CM

## 2010-12-28 ENCOUNTER — Telehealth: Payer: Self-pay | Admitting: *Deleted

## 2010-12-28 NOTE — Telephone Encounter (Signed)
Call-A-Nurse Triage Call Report Triage Record Num: [id number] Operator: Ether Griffins Patient Name: Gina [name] Call Date & Time: 12/26/2010 3:11:02PM Patient Phone: [phone] PCP: [name] Patient Gender: Female PCP Fax : [phone] Patient DOB: 12-22-1975 [facility] Reason for Call: Calling about sharp pain in left shoulder near lung. Constant pain.Afebrile.Hurts when breathing.Smoker.Has been going on for sometime but is getting worse.Get SOB easily.Takes pain med for chronic back and abd pain.All emergent sxs of Shoulder non-injury r/o.Contacted Dr.Letvak and he advised to go to UC or ED to be assessed. Protocol(s) Used: Shoulder Non-Injury Recommended Outcome per Protocol: See Provider within 24 hours Reason for Outcome: New onset mild to moderate pain that has not improved with 24 hours of home care Care Advice: ~ Call provider if symptoms worsen or new symptoms develop. ~ Avoid activity that causes or worsens symptoms. ~ SYMPTOM / CONDITION MANAGEMENT ~ CAUTIONS 12/26/2010 3:37:15PM Page 1 of 1 [email or web address]

## 2011-01-01 ENCOUNTER — Emergency Department (HOSPITAL_BASED_OUTPATIENT_CLINIC_OR_DEPARTMENT_OTHER)
Admission: EM | Admit: 2011-01-01 | Discharge: 2011-01-02 | Disposition: A | Discharge status: Home / Self Care | Payer: 59 | Attending: Emergency Medicine | Admitting: Emergency Medicine

## 2011-01-01 DIAGNOSIS — M129 Arthropathy, unspecified: Secondary | ICD-10-CM | POA: Insufficient documentation

## 2011-01-01 DIAGNOSIS — G8929 Other chronic pain: Secondary | ICD-10-CM | POA: Insufficient documentation

## 2011-01-01 DIAGNOSIS — K5289 Other specified noninfective gastroenteritis and colitis: Secondary | ICD-10-CM | POA: Insufficient documentation

## 2011-01-01 DIAGNOSIS — R197 Diarrhea, unspecified: Secondary | ICD-10-CM | POA: Insufficient documentation

## 2011-01-01 DIAGNOSIS — R509 Fever, unspecified: Secondary | ICD-10-CM | POA: Insufficient documentation

## 2011-01-01 DIAGNOSIS — R109 Unspecified abdominal pain: Secondary | ICD-10-CM | POA: Insufficient documentation

## 2011-01-01 DIAGNOSIS — F341 Dysthymic disorder: Secondary | ICD-10-CM | POA: Insufficient documentation

## 2011-01-01 DIAGNOSIS — R112 Nausea with vomiting, unspecified: Secondary | ICD-10-CM | POA: Insufficient documentation

## 2011-01-01 DIAGNOSIS — Z79899 Other long term (current) drug therapy: Secondary | ICD-10-CM | POA: Insufficient documentation

## 2011-01-01 LAB — LIPASE, BLOOD: Lipase: 38 U/L (ref 23–300)

## 2011-01-01 LAB — CBC
Hemoglobin: 13.8 g/dL (ref 12.0–15.0)
Platelets: 130 10*3/uL — ABNORMAL LOW (ref 150–400)
RBC: 4.31 MIL/uL (ref 3.87–5.11)
WBC: 5.7 10*3/uL (ref 4.0–10.5)

## 2011-01-01 LAB — DIFFERENTIAL
Basophils Absolute: 0 10*3/uL (ref 0.0–0.1)
Basophils Relative: 0 % (ref 0–1)
Eosinophils Absolute: 0.2 10*3/uL (ref 0.0–0.7)
Monocytes Relative: 6 % (ref 3–12)
Neutro Abs: 4 10*3/uL (ref 1.7–7.7)
Neutrophils Relative %: 69 % (ref 43–77)

## 2011-01-01 LAB — COMPREHENSIVE METABOLIC PANEL
Albumin: 3.3 g/dL — ABNORMAL LOW (ref 3.5–5.2)
BUN: 13 mg/dL (ref 6–23)
Chloride: 113 mEq/L — ABNORMAL HIGH (ref 96–112)
Creatinine, Ser: 0.5 mg/dL (ref 0.4–1.2)
GFR calc non Af Amer: >60 mL/min (ref >60)
Total Bilirubin: 1 mg/dL (ref 0.3–1.2)

## 2011-01-05 ENCOUNTER — Telehealth: Payer: Self-pay | Admitting: Internal Medicine

## 2011-01-05 DIAGNOSIS — M5412 Radiculopathy, cervical region: Secondary | ICD-10-CM

## 2011-01-05 NOTE — Telephone Encounter (Signed)
Pt called req script for Oxycodone 10-325 mg. Pt has to pick up this script tomorrow asap. Pt called earlier today 3 times, but said that she couldn't get through.

## 2011-01-06 ENCOUNTER — Telehealth: Payer: Self-pay | Admitting: Internal Medicine

## 2011-01-06 MED ORDER — OXYCODONE-ACETAMINOPHEN 10-325 MG PO TABS: 1.0000 | ORAL_TABLET | ORAL | Status: DC | PRN

## 2011-01-06 NOTE — Telephone Encounter (Signed)
Pt needs to a refill on med: Oxycodone 10-325.... Pt can be reached at [phone].

## 2011-01-06 NOTE — Telephone Encounter (Signed)
Already taken care of

## 2011-01-06 NOTE — Telephone Encounter (Signed)
rx up front ready for p/u, pt aware 

## 2011-01-08 ENCOUNTER — Encounter: Payer: Self-pay | Admitting: Internal Medicine

## 2011-01-08 ENCOUNTER — Ambulatory Visit: Payer: 59 | Admitting: Internal Medicine

## 2011-01-08 ENCOUNTER — Telehealth: Payer: Self-pay | Admitting: *Deleted

## 2011-01-08 ENCOUNTER — Ambulatory Visit (INDEPENDENT_AMBULATORY_CARE_PROVIDER_SITE_OTHER): Payer: 59 | Admitting: Internal Medicine

## 2011-01-08 VITALS — BP 100/70 | Temp 99.0°F | Wt 140.0 lb

## 2011-01-08 DIAGNOSIS — M79605 Pain in left leg: Secondary | ICD-10-CM

## 2011-01-08 DIAGNOSIS — M546 Pain in thoracic spine: Secondary | ICD-10-CM

## 2011-01-08 DIAGNOSIS — M79609 Pain in unspecified limb: Secondary | ICD-10-CM

## 2011-01-08 DIAGNOSIS — M5412 Radiculopathy, cervical region: Secondary | ICD-10-CM

## 2011-01-08 NOTE — Patient Instructions (Signed)
Call or return to clinic prn if these symptoms worsen or fail to improve as anticipated.

## 2011-01-08 NOTE — Telephone Encounter (Signed)
Call-A-Nurse Triage Call Report Triage Record Num: [id number] Operator: Albertine Grates Patient Name: Gina [name] Call Date & Time: 01/07/2011 11:34:45PM Patient Phone: [phone] PCP: [name] Patient Gender: Female PCP Fax : [phone] Patient DOB: 1976-01-13 [facility] Reason for Call: Has had "strange" sensation in leg since 3-8. States can feel blood flowing through leg. Has noticed "lines" on leg after getting undressed. Is 8 lines on calf. Is tender. No swelling. States cannot go through night without knowing what is causing this sensation. Advised would have to go to ED but requests/demands MD be notified. Wants to speak to MD. Dr. Debby Bud notified and advised pt. to go to ED. Pt. states will call office 3-9 and be seen. Protocol(s) Used: Leg Non-Injury Recommended Outcome per Protocol: See ED Immediately Reason for Outcome: Recent onset of one-sided pain, tenderness, or aching that may worsen with standing or walking OR a cord-like swelling that may feel warm, look red or discolored. Care Advice: ~ 01/08/2011 12:09:49AM Page 1 of 1 [email or web address]

## 2011-01-08 NOTE — Progress Notes (Signed)
  Subjective:    Patient ID: Gina [name], female    DOB: May 18, 1976, 35 y.o.   MRN: [id number]  HPI   35 year old patient who presents with a two-day history of paresthesias involving the left knee she basically describes this as a quivering sensation involving  Primarily  the left lateral calf region. She does have a history of chronic low back pain as well as cervical disc disease. She is concerned about blood clots. There's been no pulmonary complaints leg swelling or tenderness. She does have a history of anxiety disorder   Review of Systems  Constitutional: Negative.   HENT: Negative for hearing loss, congestion, sore throat, rhinorrhea, dental problem, sinus pressure and tinnitus.   Eyes: Negative for pain, discharge and visual disturbance.  Respiratory: Negative for cough and shortness of breath.   Cardiovascular: Negative for chest pain, palpitations and leg swelling.  Gastrointestinal: Negative for nausea, vomiting, abdominal pain, diarrhea, constipation, blood in stool and abdominal distention.  Genitourinary: Negative for dysuria, urgency, frequency, hematuria, flank pain, vaginal bleeding, vaginal discharge, difficulty urinating, vaginal pain and pelvic pain.  Musculoskeletal: Negative for joint swelling, arthralgias and gait problem.  Skin: Negative for rash.  Neurological: Negative for dizziness, syncope, speech difficulty, weakness, numbness and headaches.  Hematological: Negative for adenopathy.  Psychiatric/Behavioral: Negative for behavioral problems, dysphoric mood and agitation. The patient is not nervous/anxious.        Objective:   Physical Exam  Constitutional: She appears well-developed. No distress.        Anxious no distress  Musculoskeletal: Normal range of motion. She exhibits no edema and no tenderness.        Examination of the left leg revealed no abnormalities. There is no localized tenderness  Or swelling. No inflammatory changes.           Assessment & Plan:   leg pain. Unremarkable clinical exam. We'll check a d-dimer and hopefully this will be negative;  she was concerned about a DVT and this is the only potential diagnosis that may be serious

## 2011-01-09 LAB — D-DIMER, QUANTITATIVE: D-Dimer, Quant: 0.26 ug/mL-FEU (ref 0.00–0.48)

## 2011-01-11 ENCOUNTER — Ambulatory Visit: Payer: 59 | Admitting: Family Medicine

## 2011-01-11 NOTE — Progress Notes (Signed)
Quick Note:  Spoke with pt - informed lab was normal. KIK ______

## 2011-01-14 LAB — DIFFERENTIAL
Basophils Relative: 0 % (ref 0–1)
Eosinophils Absolute: 0.4 10*3/uL (ref 0.0–0.7)
Monocytes Absolute: 0.5 10*3/uL (ref 0.1–1.0)
Monocytes Relative: 7 % (ref 3–12)

## 2011-01-14 LAB — URINALYSIS, ROUTINE W REFLEX MICROSCOPIC
Glucose, UA: NEGATIVE mg/dL
Hgb urine dipstick: NEGATIVE
Ketones, ur: NEGATIVE mg/dL
Protein, ur: NEGATIVE mg/dL
pH: 7.5 (ref 5.0–8.0)

## 2011-01-14 LAB — COMPREHENSIVE METABOLIC PANEL
ALT: 14 U/L (ref 0–35)
Albumin: 4.2 g/dL (ref 3.5–5.2)
Alkaline Phosphatase: 83 U/L (ref 39–117)
GFR calc Af Amer: >60 mL/min (ref >60)
Potassium: 3.7 mEq/L (ref 3.5–5.1)
Sodium: 138 mEq/L (ref 135–145)
Total Protein: 6.5 g/dL (ref 6.0–8.3)

## 2011-01-14 LAB — URINE CULTURE
Colony Count: 15000
Culture  Setup Time: 201108170151

## 2011-01-14 LAB — CBC
HCT: 39.1 % (ref 36.0–46.0)
Platelets: 169 10*3/uL (ref 150–400)
RDW: 12.1 % (ref 11.5–15.5)
WBC: 6.6 10*3/uL (ref 4.0–10.5)

## 2011-01-16 LAB — CBC
HCT: 33.9 % — ABNORMAL LOW (ref 36.0–46.0)
Hemoglobin: 11.8 g/dL — ABNORMAL LOW (ref 12.0–15.0)
MCH: 33.4 pg (ref 26.0–34.0)
MCH: 33.4 pg (ref 26.0–34.0)
MCHC: 34.8 g/dL (ref 30.0–36.0)
Platelets: 160 10*3/uL (ref 150–400)
RBC: 3.53 MIL/uL — ABNORMAL LOW (ref 3.87–5.11)
RBC: 3.97 MIL/uL (ref 3.87–5.11)
RDW: 12.2 % (ref 11.5–15.5)

## 2011-01-16 LAB — DIFFERENTIAL
Basophils Absolute: 0 10*3/uL (ref 0.0–0.1)
Eosinophils Absolute: 0.4 10*3/uL (ref 0.0–0.7)
Eosinophils Relative: 7 % — ABNORMAL HIGH (ref 0–5)
Eosinophils Relative: 8 % — ABNORMAL HIGH (ref 0–5)
Lymphocytes Relative: 42 % (ref 12–46)
Lymphs Abs: 2.8 10*3/uL (ref 0.7–4.0)
Monocytes Absolute: 0.5 10*3/uL (ref 0.1–1.0)
Monocytes Relative: 7 % (ref 3–12)
Neutro Abs: 2.9 10*3/uL (ref 1.7–7.7)
Neutrophils Relative %: 34 % — ABNORMAL LOW (ref 43–77)

## 2011-01-16 LAB — BASIC METABOLIC PANEL
BUN: 11 mg/dL (ref 6–23)
Calcium: 7.7 mg/dL — ABNORMAL LOW (ref 8.4–10.5)
Creatinine, Ser: 0.6 mg/dL (ref 0.4–1.2)
GFR calc Af Amer: >60 mL/min (ref >60)
GFR calc non Af Amer: >60 mL/min (ref >60)

## 2011-01-16 LAB — COMPREHENSIVE METABOLIC PANEL
ALT: 11 U/L (ref 0–35)
AST: 14 U/L (ref 0–37)
AST: 17 U/L (ref 0–37)
Albumin: 4 g/dL (ref 3.5–5.2)
BUN: 7 mg/dL (ref 6–23)
CO2: 26 mEq/L (ref 19–32)
Calcium: 8.1 mg/dL — ABNORMAL LOW (ref 8.4–10.5)
Calcium: 8.9 mg/dL (ref 8.4–10.5)
Chloride: 108 mEq/L (ref 96–112)
Chloride: 110 mEq/L (ref 96–112)
Creatinine, Ser: 0.57 mg/dL (ref 0.4–1.2)
Creatinine, Ser: 0.65 mg/dL (ref 0.4–1.2)
GFR calc Af Amer: >60 mL/min (ref >60)
GFR calc Af Amer: >60 mL/min (ref >60)
GFR calc non Af Amer: >60 mL/min (ref >60)
Glucose, Bld: 89 mg/dL (ref 70–99)
Sodium: 139 mEq/L (ref 135–145)
Total Bilirubin: 0.2 mg/dL — ABNORMAL LOW (ref 0.3–1.2)
Total Bilirubin: 0.6 mg/dL (ref 0.3–1.2)
Total Protein: 6.4 g/dL (ref 6.0–8.3)

## 2011-01-16 LAB — MAGNESIUM: Magnesium: 1.9 mg/dL (ref 1.5–2.5)

## 2011-01-16 LAB — URINALYSIS, ROUTINE W REFLEX MICROSCOPIC
Bilirubin Urine: NEGATIVE
Glucose, UA: NEGATIVE mg/dL
Hgb urine dipstick: NEGATIVE
Ketones, ur: NEGATIVE mg/dL
Nitrite: NEGATIVE
Specific Gravity, Urine: 1.009 (ref 1.005–1.030)
pH: 7 (ref 5.0–8.0)

## 2011-01-18 LAB — DIFFERENTIAL
Eosinophils Absolute: 0.4 10*3/uL (ref 0.0–0.7)
Lymphs Abs: 2.9 10*3/uL (ref 0.7–4.0)
Monocytes Absolute: 0.6 10*3/uL (ref 0.1–1.0)
Monocytes Relative: 7 % (ref 3–12)
Neutro Abs: 5.2 10*3/uL (ref 1.7–7.7)
Neutrophils Relative %: 57 % (ref 43–77)

## 2011-01-18 LAB — WET PREP, GENITAL: Clue Cells Wet Prep HPF POC: NONE SEEN

## 2011-01-18 LAB — COMPREHENSIVE METABOLIC PANEL
ALT: 18 U/L (ref 0–35)
Albumin: 3.9 g/dL (ref 3.5–5.2)
Alkaline Phosphatase: 92 U/L (ref 39–117)
Calcium: 9.2 mg/dL (ref 8.4–10.5)
Glucose, Bld: 87 mg/dL (ref 70–99)
Potassium: 3.6 mEq/L (ref 3.5–5.1)
Sodium: 138 mEq/L (ref 135–145)
Total Protein: 6.1 g/dL (ref 6.0–8.3)

## 2011-01-18 LAB — URINALYSIS, ROUTINE W REFLEX MICROSCOPIC
Ketones, ur: NEGATIVE mg/dL
Nitrite: NEGATIVE
Protein, ur: NEGATIVE mg/dL
Urobilinogen, UA: 0.2 mg/dL (ref 0.0–1.0)

## 2011-01-18 LAB — CBC
Hemoglobin: 14.2 g/dL (ref 12.0–15.0)
MCHC: 33.7 g/dL (ref 30.0–36.0)
Platelets: 186 10*3/uL (ref 150–400)
RDW: 12.5 % (ref 11.5–15.5)

## 2011-01-21 ENCOUNTER — Emergency Department (HOSPITAL_BASED_OUTPATIENT_CLINIC_OR_DEPARTMENT_OTHER)
Admission: EM | Admit: 2011-01-21 | Discharge: 2011-01-21 | Disposition: A | Discharge status: Home / Self Care | Payer: 59 | Attending: Emergency Medicine | Admitting: Emergency Medicine

## 2011-01-21 DIAGNOSIS — G8929 Other chronic pain: Secondary | ICD-10-CM | POA: Insufficient documentation

## 2011-01-21 DIAGNOSIS — H9319 Tinnitus, unspecified ear: Secondary | ICD-10-CM | POA: Insufficient documentation

## 2011-01-21 DIAGNOSIS — K589 Irritable bowel syndrome without diarrhea: Secondary | ICD-10-CM | POA: Insufficient documentation

## 2011-01-21 DIAGNOSIS — R1310 Dysphagia, unspecified: Secondary | ICD-10-CM | POA: Insufficient documentation

## 2011-01-21 DIAGNOSIS — T450X5A Adverse effect of antiallergic and antiemetic drugs, initial encounter: Secondary | ICD-10-CM | POA: Insufficient documentation

## 2011-01-21 DIAGNOSIS — F17200 Nicotine dependence, unspecified, uncomplicated: Secondary | ICD-10-CM | POA: Insufficient documentation

## 2011-01-21 DIAGNOSIS — Z8739 Personal history of other diseases of the musculoskeletal system and connective tissue: Secondary | ICD-10-CM | POA: Insufficient documentation

## 2011-01-21 DIAGNOSIS — Z79899 Other long term (current) drug therapy: Secondary | ICD-10-CM | POA: Insufficient documentation

## 2011-01-21 DIAGNOSIS — F341 Dysthymic disorder: Secondary | ICD-10-CM | POA: Insufficient documentation

## 2011-01-22 ENCOUNTER — Telehealth: Payer: Self-pay | Admitting: *Deleted

## 2011-01-22 ENCOUNTER — Emergency Department (HOSPITAL_BASED_OUTPATIENT_CLINIC_OR_DEPARTMENT_OTHER)
Admission: EM | Admit: 2011-01-22 | Discharge: 2011-01-22 | Disposition: A | Discharge status: Home / Self Care | Payer: 59 | Attending: Emergency Medicine | Admitting: Emergency Medicine

## 2011-01-22 DIAGNOSIS — G8929 Other chronic pain: Secondary | ICD-10-CM | POA: Insufficient documentation

## 2011-01-22 DIAGNOSIS — R220 Localized swelling, mass and lump, head: Secondary | ICD-10-CM | POA: Insufficient documentation

## 2011-01-22 DIAGNOSIS — K589 Irritable bowel syndrome without diarrhea: Secondary | ICD-10-CM | POA: Insufficient documentation

## 2011-01-22 DIAGNOSIS — R221 Localized swelling, mass and lump, neck: Secondary | ICD-10-CM | POA: Insufficient documentation

## 2011-01-22 DIAGNOSIS — F17200 Nicotine dependence, unspecified, uncomplicated: Secondary | ICD-10-CM | POA: Insufficient documentation

## 2011-01-22 DIAGNOSIS — E860 Dehydration: Secondary | ICD-10-CM | POA: Insufficient documentation

## 2011-01-22 NOTE — Telephone Encounter (Signed)
Left message with Dr. York Ram recommendations.

## 2011-01-22 NOTE — Telephone Encounter (Signed)
Dr Lovell Sheehan is aware of allergy and will document in chart-if she wants to talk to him about upcoming surgery will need ov

## 2011-01-22 NOTE — Telephone Encounter (Signed)
Pt insists she has to speak to Dr. Lovell Sheehan re: her surgery 3 days ago.  Given meds she was allergic to and almost died.  HAS to speak to Dr. Lovell Sheehan because he is the only person she trusts to help her make her next decisions.

## 2011-01-25 ENCOUNTER — Telehealth: Payer: Self-pay | Admitting: Internal Medicine

## 2011-01-25 DIAGNOSIS — M5412 Radiculopathy, cervical region: Secondary | ICD-10-CM

## 2011-01-25 MED ORDER — OXYCODONE-ACETAMINOPHEN 10-325 MG PO TABS: 1.0000 | ORAL_TABLET | ORAL | Status: DC | PRN

## 2011-01-25 NOTE — Telephone Encounter (Signed)
Pt called req Oxycodone 10-325mg . Pls call pt when ready for pick up.

## 2011-01-28 ENCOUNTER — Ambulatory Visit (INDEPENDENT_AMBULATORY_CARE_PROVIDER_SITE_OTHER): Payer: 59 | Admitting: Internal Medicine

## 2011-01-28 ENCOUNTER — Encounter: Payer: Self-pay | Admitting: Internal Medicine

## 2011-01-28 VITALS — BP 110/70 | HR 72 | Temp 98.3°F | Resp 14 | Ht 67.0 in | Wt 140.0 lb

## 2011-01-28 DIAGNOSIS — F329 Major depressive disorder, single episode, unspecified: Secondary | ICD-10-CM

## 2011-01-28 DIAGNOSIS — T782XXA Anaphylactic shock, unspecified, initial encounter: Secondary | ICD-10-CM

## 2011-01-28 DIAGNOSIS — R109 Unspecified abdominal pain: Secondary | ICD-10-CM

## 2011-01-28 DIAGNOSIS — F3289 Other specified depressive episodes: Secondary | ICD-10-CM

## 2011-01-28 MED ORDER — ESCITALOPRAM OXALATE 20 MG PO TABS: 20.0000 mg | ORAL_TABLET | Freq: Every day | ORAL | Status: DC

## 2011-01-28 NOTE — Progress Notes (Signed)
Subjective:    Patient ID: Gina [name], female    DOB: 1976/09/04, 35 y.o.   MRN: [id number]  HPI The patient had a dental abscess and required gum surgery apparently during the Surgery she was given Zofran which she has a stated allergy to and developed respiratory distress which required a visit to the emergency room. She said the sutures removed there does not appear to be lingering abscess. As one might imagine she has increased anxiety as this has made her pre-existing anxiety and depression. The patient relates that this episode has reinforced her anxiety and her depression as well as she notes that recent experiences and friendships and family have contributed to a worsening depressed state    Review of Systems  Constitutional: Negative for activity change, appetite change and fatigue.  HENT: Negative for ear pain, congestion, neck pain, postnasal drip and sinus pressure.   Eyes: Negative for redness and visual disturbance.  Respiratory: Negative for cough, shortness of breath and wheezing.   Gastrointestinal: Negative for abdominal pain and abdominal distention.  Genitourinary: Negative for dysuria, frequency and menstrual problem.  Musculoskeletal: Negative for myalgias, joint swelling and arthralgias.  Skin: Negative for rash and wound.  Neurological: Negative for dizziness, weakness and headaches.  Hematological: Negative for adenopathy. Does not bruise/bleed easily.  Psychiatric/Behavioral: Positive for dysphoric mood, decreased concentration and agitation. Negative for sleep disturbance.       Objective:   Physical Exam  Constitutional: She is oriented to person, place, and time. She appears well-developed and well-nourished. No distress.  HENT:  Head: Normocephalic and atraumatic.  Right Ear: External ear normal.  Left Ear: External ear normal.  Nose: Nose normal.  Mouth/Throat: Oropharynx is clear and moist.  Eyes: Conjunctivae and EOM are normal. Pupils are  equal, round, and reactive to light.  Neck: Normal range of motion. Neck supple. No JVD present. No tracheal deviation present. No thyromegaly present.  Cardiovascular: Normal rate, regular rhythm, normal heart sounds and intact distal pulses.   No murmur heard. Pulmonary/Chest: Effort normal and breath sounds normal. She has no wheezes. She exhibits no tenderness.  Abdominal: Soft. Bowel sounds are normal.  Musculoskeletal: Normal range of motion. She exhibits no edema and no tenderness.  Lymphadenopathy:    She has no cervical adenopathy.  Neurological: She is alert and oriented to person, place, and time. She has normal reflexes. No cranial nerve deficit.  Skin: Skin is warm and dry. She is not diaphoretic.  Psychiatric: She has a normal mood and affect. Her behavior is normal.       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  She has increased anxiety and depression over multiple contributing factors could  home her relationship with her husband and her mother school stresses and now the stress of the dental surgery and allergic reaction.  We have urged her to continue to see her therapist her therapist recommended that the  Lexapro be doubled and we have given her a prescription for 20 mg of Lexapro will followup in 5 weeks to see the effectiveness of this medication

## 2011-01-28 NOTE — Patient Instructions (Signed)
Lookup online a inpatient detoxification center in [place] [address] called [facility] and read about it prior to her next visit

## 2011-02-09 LAB — DIFFERENTIAL
Basophils Relative: 0 % (ref 0–1)
Eosinophils Absolute: 0.4 10*3/uL (ref 0.0–0.7)
Eosinophils Relative: 5 % (ref 0–5)
Lymphs Abs: 3.2 10*3/uL (ref 0.7–4.0)
Monocytes Absolute: 0.5 10*3/uL (ref 0.1–1.0)
Monocytes Relative: 7 % (ref 3–12)

## 2011-02-09 LAB — POCT I-STAT, CHEM 8
Calcium, Ion: 1.13 mmol/L (ref 1.12–1.32)
Creatinine, Ser: 0.6 mg/dL (ref 0.4–1.2)
Glucose, Bld: 76 mg/dL (ref 70–99)
Hemoglobin: 13.9 g/dL (ref 12.0–15.0)
TCO2: 24 mmol/L (ref 0–100)

## 2011-02-09 LAB — CBC
HCT: 40 % (ref 36.0–46.0)
MCHC: 34.5 g/dL (ref 30.0–36.0)
MCV: 95.5 fL (ref 78.0–100.0)
RBC: 4.19 MIL/uL (ref 3.87–5.11)
WBC: 7.4 10*3/uL (ref 4.0–10.5)

## 2011-02-09 LAB — URINALYSIS, ROUTINE W REFLEX MICROSCOPIC
Bilirubin Urine: NEGATIVE
Glucose, UA: NEGATIVE mg/dL
Hgb urine dipstick: NEGATIVE
Specific Gravity, Urine: 1.015 (ref 1.005–1.030)

## 2011-02-17 ENCOUNTER — Emergency Department (HOSPITAL_COMMUNITY): Payer: 59

## 2011-02-17 ENCOUNTER — Emergency Department (HOSPITAL_COMMUNITY)
Admission: EM | Admit: 2011-02-17 | Discharge: 2011-02-18 | Disposition: A | Discharge status: Home / Self Care | Payer: 59 | Attending: Emergency Medicine | Admitting: Emergency Medicine

## 2011-02-17 DIAGNOSIS — K5900 Constipation, unspecified: Secondary | ICD-10-CM | POA: Insufficient documentation

## 2011-02-17 DIAGNOSIS — F17200 Nicotine dependence, unspecified, uncomplicated: Secondary | ICD-10-CM | POA: Insufficient documentation

## 2011-02-17 DIAGNOSIS — K6289 Other specified diseases of anus and rectum: Secondary | ICD-10-CM | POA: Insufficient documentation

## 2011-02-17 DIAGNOSIS — F341 Dysthymic disorder: Secondary | ICD-10-CM | POA: Insufficient documentation

## 2011-02-17 DIAGNOSIS — R10819 Abdominal tenderness, unspecified site: Secondary | ICD-10-CM | POA: Insufficient documentation

## 2011-02-18 ENCOUNTER — Telehealth: Payer: Self-pay | Admitting: Internal Medicine

## 2011-02-18 ENCOUNTER — Encounter (HOSPITAL_COMMUNITY): Payer: Self-pay

## 2011-02-18 ENCOUNTER — Emergency Department (HOSPITAL_COMMUNITY): Payer: 59

## 2011-02-18 DIAGNOSIS — M5412 Radiculopathy, cervical region: Secondary | ICD-10-CM

## 2011-02-18 LAB — CBC
HCT: 41.5 % (ref 36.0–46.0)
MCHC: 34.5 g/dL (ref 30.0–36.0)
Platelets: 218 10*3/uL (ref 150–400)
RDW: 12 % (ref 11.5–15.5)

## 2011-02-18 LAB — DIFFERENTIAL
Basophils Absolute: 0 10*3/uL (ref 0.0–0.1)
Eosinophils Absolute: 0.3 10*3/uL (ref 0.0–0.7)
Eosinophils Relative: 3 % (ref 0–5)
Lymphocytes Relative: 43 % (ref 12–46)
Monocytes Absolute: 0.6 10*3/uL (ref 0.1–1.0)

## 2011-02-18 LAB — BASIC METABOLIC PANEL
BUN: 9 mg/dL (ref 6–23)
Calcium: 9.3 mg/dL (ref 8.4–10.5)
GFR calc non Af Amer: >60 mL/min (ref >60)
Glucose, Bld: 91 mg/dL (ref 70–99)
Sodium: 140 mEq/L (ref 135–145)

## 2011-02-18 MED ORDER — IOHEXOL 300 MG/ML  SOLN
100.0000 mL | Freq: Once | INTRAMUSCULAR | Status: AC | PRN
  Administered 2011-02-18: 100 mL via INTRAVENOUS

## 2011-02-18 MED ORDER — OXYCODONE-ACETAMINOPHEN 10-325 MG PO TABS: 1.0000 | ORAL_TABLET | ORAL | Status: DC | PRN

## 2011-02-18 NOTE — Telephone Encounter (Signed)
Refill Oxycodone. Her or her husband will p/u when ready.

## 2011-02-18 NOTE — Telephone Encounter (Signed)
Script is awaiting signature by md--will be Friday before it is ready for pickup

## 2011-02-19 NOTE — Telephone Encounter (Signed)
Done and pt informed.

## 2011-03-08 ENCOUNTER — Telehealth: Payer: Self-pay | Admitting: Internal Medicine

## 2011-03-12 ENCOUNTER — Other Ambulatory Visit: Payer: Self-pay | Admitting: Internal Medicine

## 2011-03-12 DIAGNOSIS — M5412 Radiculopathy, cervical region: Secondary | ICD-10-CM

## 2011-03-12 MED ORDER — OXYCODONE-ACETAMINOPHEN 10-325 MG PO TABS: 1.0000 | ORAL_TABLET | ORAL | Status: DC | PRN

## 2011-03-12 NOTE — Telephone Encounter (Signed)
Refill Oxycodone 

## 2011-03-12 NOTE — Telephone Encounter (Signed)
Pt informed

## 2011-03-15 ENCOUNTER — Telehealth: Payer: Self-pay | Admitting: Internal Medicine

## 2011-03-15 NOTE — Telephone Encounter (Signed)
I'm sorry, but I can't ignore what she said previously. She needs to find another GI group to assume her care.

## 2011-03-15 NOTE — Telephone Encounter (Signed)
Pt aware of Dr. Lamar Sprinkles recommendation that she find another GI group to assume her care.

## 2011-03-15 NOTE — Telephone Encounter (Signed)
The patient called in February stating that she wanted to switch doctors. She reported having a personality conflict with me. As such, I recommended, at that time, that she establish with a different GI group. I believe that this would be best for both parties.

## 2011-03-15 NOTE — Telephone Encounter (Signed)
Pt states that she is back like she was in January. She is having problems swallowing and has placed herself on a clear liquid diet. Pt states she needs to have her esophagus dilated. She is going out of town this weekend for her anniversary and would like to have it done before this weekend but knows this is short notice. Dr. Marina Goodell please advise.

## 2011-03-15 NOTE — Telephone Encounter (Signed)
Spoke with pt and let her know Dr. Lamar Sprinkles recommendation. Pt states that she feels perfectly comfortable with Dr. Marina Goodell and that her insurance has changed and she does not have the time to get another group. Pt states that Dr. Marina Goodell had told her that she may want to seek a 2nd opinion in the past but those groups were not covered by her insurance. Pt states that Dr. Marina Goodell is her doctor and she would like for him to handle the procedure. Let pt know that I would speak with Dr. Marina Goodell but that she may need to go the ER to have the procedure done. Let pt know that I would call her back. Please advise.

## 2011-03-16 NOTE — Assessment & Plan Note (Signed)
[facility]ALTHCARE                                 ON-CALL NOTE   NAME:[name], [name]                        MRN:          [id number]  DATE:01/16/2008                            DOB:          06-15-1976    Recall at [phone].  It is actually her mother, [name], who is  calling this time on January 16, 2008, at 9:44 p.m.   CHIEF COMPLAINT:  Back pain.   Dr. Lovell Sheehan is her regular doctor and Dr. Milinda Antis on call.   Patient's mother is calling to say she went to get Gina [name] to take her to  the emergency room, as she was advised earlier, for severe back pain,  was absolutely unable to move her from the bed due to severe pain and  the inability to move because of it.  They called 911; EMS came and got  her and have taken her over to [facility] now.  She is waiting  in triage and getting hooked up with a Foley.  Her mother is very  worried about the timeliness with which she will be seen, because her  pain is so severe.  She said that one of the hospital staff told her Dr.  Lovell Sheehan was on call for the hospital tonight.  She wanted to know if I  could have a way to let him know that she was in the hospital and if  this may speed up her evaluation.  I told her that, generally, she would  need to be evaluated by the emergency room doctor first and then they  would call the hospital doctor after that, but I would also put in a  page to Dr. Lovell Sheehan to let him know she is there and I did call the  answering service and had them do just that.     Marne A. Tower, MD  Electronically Signed    MAT/MedQ  DD: 01/16/2008  DT: 01/17/2008  Job #: [id number]

## 2011-03-16 NOTE — Procedures (Signed)
NAME:  Gina [name], Gina [name] NO.:  [id number]   MEDICAL RECORD NO.:  [id number]          PATIENT TYPE:  REC   LOCATION:  [facility]                          FACILITY:  [facility]   PHYSICIAN:  Erick Colace, M.D.DATE OF BIRTH:  Jan 13, 1976   DATE OF PROCEDURE:  DATE OF DISCHARGE:                               OPERATIVE REPORT   PROCEDURE:  A left sacroiliac injection under fluoroscopic guidance.   INDICATION:  Left sacroiliac pain only partially responsive to  medication management and other conservative care.   Informed consent was obtained after describing risks and benefits of the  procedure with the patient.  These include bleeding, bruising,  infection.  She elects to proceed and has given written consent.  The  patient placed prone on fluoroscopy table.  Betadine prep and sterile  drape.  A 25-gauge inch and a half needle was used to anesthetize the  skin and subcutaneous tissue with 1% lidocaine x2 mLs and a 25-gauge 3  inches spinal needle was inserted into the left sacroiliac joint under  AP and lateral and oblique imaging.  Omnipaque 180 under live fluoro  demonstrated no intravascular uptake.  Then, solution containing 1 mL of  2% MPF lidocaine plus 0.5 mL of 40 mg/mL Depo-Medrol was injected.  The  patient tolerated the procedure well.  Pre and post injection vitals  stable.  Post injection instructions given.  Pre and post injection  level 4/10.  The patient will see her primary physician for medication  management.      Erick Colace, M.D.  Electronically Signed     AEK/MEDQ  D:  02/17/2009 12:07:31  T:  02/18/2009 03:21:08  Job:  [id number]   cc:   Stacie Glaze, MD  [address] [place]  [place] [address]

## 2011-03-16 NOTE — Assessment & Plan Note (Signed)
[facility] HEALTHCARE                                 ON-CALL NOTE   NAME:PARRISHIasha, Mccalister                      MRN:          [id number]  DATE:07/03/2007                            DOB:          December 25, 1975    PHONE NUMBER:  [phone]  Patient calling because she only has three OxyContin left.  She needs a  refill.  Advised to come to the office on Tuesday at 8:30 a.m. to get a  written refill.     Jeffrey A. Tawanna Cooler, MD  Electronically Signed    JAT/MedQ  DD: 07/03/2007  DT: 07/03/2007  Job #: [id number]

## 2011-03-16 NOTE — Assessment & Plan Note (Signed)
[facility] HEALTHCARE                                 ON-CALL NOTE   NAME:[name], [name]                        MRN:          [id number]  DATE:04/28/2007                            DOB:          03-28-1976    PRIMARY CARE PHYSICIAN:  Dr. Lovell Sheehan.   TELEPHONE NUMBER:  [phone]   TIME OF CALL:  5:39pm.   She has been followed by Dr. Lovell Sheehan for chronic abdominal pain syndrome  which she thinks may be related to a problem with a mesh from a ventral  hernia apparently. She has been on Vicodin in the past and is currently  taking liquid Morphine three times a day, but that is not helping her  today. She really feels like the pain is much worse. I explained to her  that I could not phone in anything more for her pain and she started  crying and said that she was really in pain. I told her that it would be  totally inappropriate for me to treat her over the phone too, and I  recommended that she seek care in the emergency room. I did tell her  that we had a clinic in the morning on Saturdays and that I would be  willing to see her there, but I instructed her that if the pain was  severe, she really needed to seek treatment in the emergency room  tonight.     Karie Schwalbe, MD  Electronically Signed    RIL/MedQ  DD: 04/28/2007  DT: 04/29/2007  Job #: [id number]   cc:   Stacie Glaze, MD

## 2011-03-16 NOTE — Assessment & Plan Note (Signed)
[facility] HEALTHCARE                                 ON-CALL NOTE   NAME:[name], Gina [name]                      MRN:          [id number]  DATE:01/16/2008                            DOB:          1976-01-21    Time is 8:09 p.m.; patient phone number is [phone]. Primary care doctor  is Dr. [name].   CHIEF COMPLAINT:  Back pain.   The patient has a history of back problems and fibromyalgia.  She was in  gym yesterday and overdid it.  Today, she could barely move this  morning.  She got moving and got a little bit better through the middle  of the day and now it is worse and worse.  She says she is having lower  back pain that is the most severe pain she has ever had.  She has a TENS  unit.  She has been using it all day and heat.  Her left leg is both  numb and weak at this point.  She says she takes Lyrica and she also  takes oxycodone 10/325 every 4 hours which is not touching the pain.  She said she has a remote history of disk problems, she does not know  how bad, and she does not know if she is able to take anti-  inflammatories or not.  I told her since this pain is the worst pain she  has ever had and she is having both numbness and weakness in her legs,  then she should go to the emergency room for evaluation now, and this is  what I advised her.     Marne A. Tower, MD  Electronically Signed    MAT/MedQ  DD: 01/16/2008  DT: 01/17/2008  Job #: [id number]

## 2011-03-16 NOTE — Assessment & Plan Note (Signed)
[facility] HEALTHCARE                                 ON-CALL NOTE   NAME:PARRISHJan, Walters                      MRN:          [id number]  DATE:03/18/2008                            DOB:          [birth year]    PHONE NUMBER:  [phone].   OBJECTIVE:  The patient is having heart palpitations.  The patient  started quitting smoking 11 days ago, and is using the patch.  Has tried  previously on a patch which worked fine, but she is smoking today, so  I am not sure how well it worked.  The patch has caused palpitations in  the past.  She is now on the 14 mg patch since Thursday and has been  having palpitations.  Spoke with the pharmacist who told her to cut the  patch in half.  She is still having palpitations.  I personally am not  sure that, with the delivery system of the patch, cutting it in half is  indeed halving the medication.  In fact, this could possibly be giving  her episodic bursts of medication.  She stopped the patch completely  because she felt bad, and then started the patch again.  She is allergic  to nicotine gum.  Also has anxiety and is leaving on a cruise on  Saturday.  While talking with me she became very anxious.  She had  called the EMS Saturday afternoon, and EKG was normal at that time but  she was asymptomatic at the time EKG was done.  While talking with me,  she started describing having chest pain as feels like someone sitting  on my chest, at which point I told her that she needed to go the  emergency room.  Prior to that, I tried to reassure her, keep her under  control, and let her know that I really thought she ought to try a 7 mg  patch rather than half of 14, but as we got talking more and more, she  got more and more anxious, became more and more symptomatic, and finally  said, I feel like someone sitting on my chest, at which point, again,  I told her to get the emergency room.  She became more and more anxious.  I finally got  her to say yes, she would indeed go.  At that point, she  hung up.  Primary care Epiphany Seltzer is Dr. Lovell Sheehan and home office is  [place].     Arta Silence, MD  Electronically Signed    RNS/MedQ  DD: 03/18/2008  DT: 03/19/2008  Job #: [id number]

## 2011-03-16 NOTE — Consult Note (Signed)
Gina [name] is a 35 year old female with about a 6-year history of pain  primarily affecting the left side of her body.  This includes left upper  extremity, left lower extremity, left side of the back including  thoracic, lumbar, and buttock area.  She has had abdominal pain in the  past, but this is more less resolved.  Her goal is to get her left-sided  pain to resolve.  She has had workup including orthopedic evaluation.  She has had MRI of the lumbar spine, which really was unremarkable.  She  has had MRI of the cervical spine, which was similarly unremarkable and  MRI of the brain, which was normal except for some left maxillary  sinusitis.  She has had lumbar spine x-rays as well, showed some disk  calcification L1-L2, but otherwise no abnormalities.  She has reportedly  had an EMG at [facility] by Dr. Nash Shearer.  I do not have those  results.  However, the patient states both her arms and legs were  checked and they were reportedly normal.  Looking through e-chart she  has had some ED visits January 2010 for back pain.  She was given  Dilaudid intramuscular, diazepam intramuscular.  She states that she  only does had a couple times a year.   She has been on narcotic analgesics for the better part of 6 years.  Her  current meds include oxycodone 10/325 she takes 5 times per day,  prescribed by primary physician Skelaxin 100 t.i.d. and topiramate 25  b.i.d.  Her psychiatrist treats her for anxiety with Lexapro 10 mg daily  and alprazolam 0.5 q.i.d.   Drug allergies include ZOFRAN, LAMICTAL, some other type of MUSCLE  RELAXER, CHANTIX.   PAST SURGICAL HISTORY:  She has had surgical repair of abdominal hernia.  She had a ventral hernia repair in 2006, recurrent herniation with  repair in  2007 and several other tiny surgeries including laparoscopic  surgery for endometriosis and lysis of adhesions.   She had two C-sections, youngest child is 26 years old.   SOCIAL HISTORY:   Married, lives with her husband, two children.  Smokes  a pack per day.  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  Household duties do exacerbate her pain.  She could only do about 20 or  30 minutes of cleaning at a time.  She used to be able to do about 3  hours at a time.   Her review of systems is positive for numbness, tingling, trouble  walking, spasms, dizziness, confusion, depression, anxiety,  constipation, poor appetite.   FAMILY HISTORY:  Heart disease, diabetes, high blood pressure,  psychiatric problems, and alcohol abuse.   Her opioid risk tool score is 3, which puts her in the low risk  category.   Her blood pressure is 103/61, pulse 70, respirations 18, and O2 sat 99%  room air.  A well-developed, well-nourished female in no acute stress.  Mutation x3.  Gait is normal.  She is able to toe walk, heel walk.  The  exam was chaperoned by Gina Loll, RN  Her strength are 5/5 in bilateral upper and lower extremities.  She has  normal range of motion in upper and lower extremities.  She has no  tenderness to palpation in the upper or lower extremities or in the  fibromyalgia tender points.  Sensation is intact in upper and lower extremities.  Deep tendon  reflexes normal in upper and lower extremities.  She  has normal  coordination in the upper and lower extremities on finger-nose-finger  and heel to shin.  Romberg is negative.  Joints have no evidence of swelling, no erythema, no joint deformities.  This is an the upper and lower extremities.   IMPRESSION:  1. Lumbar spine range of motion normal.  Neck range of motion normal.  2. Primary left-sided pain in both upper and lower extremities with      negative exam, negative workup.  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  Her main symptom is sensitivity to      pain, even normal pain, or normal activity that should not cause      pain.  Her husband came in.  He corroborated that  story.  I have      had other patients that have had clearly one-sided pain with      fibromyalgia and some that did not have typical tender points.  She      has tried physical therapy, such as traction massage.  She would      likely do better with more aerobic conditioning an aquatic therapy.  3. Low back, buttock pain, primarily left side.  She may have      concomitant sacroiliac disorder and we will schedule for injection      of that.  4. In terms or narcotic analgesic, I think overall plan would be to      get her off of these.  She is in agreement with that plan but is      concerned about doing this too quickly, which certainly knock off 1      tablet per month as reinstitute other treatments may consider      Savella, Cymbalta, however, would need some coordination with Dr.      Lafayette Dragon on this one.  She states she has tried some on Lyrica,      although I do not know the dose she has tried in the past.   Acupuncture may be a good treatment option as well.   I discussed the treatment plan with her and her husband.  They are in  agreement.  We will also check some arthritis panel, T4, TSH, vitamin D  level.  Thank you for this interesting consultation.  I will keep you  apprised of her progress.      Erick Colace, M.D.  Electronically Signed     AEK/MedQ  D:01/09/2009 16:30:20  T:01/10/2009 03:44:25  Job #:  [id number]   cc:   Gina Glaze, MD  [address] [place]  [place] [address]

## 2011-03-19 NOTE — Discharge Summary (Signed)
NAME:  Gina [name], Gina [name] NO.:  [id number]   MEDICAL RECORD NO.:  [id number]          PATIENT TYPE:  IPS   LOCATION:  0501                          FACILITY:  [facility]   PHYSICIAN:  Jasmine Pang, M.D. DATE OF BIRTH:  05-25-1976   DATE OF ADMISSION:  05/05/2006  DATE OF DISCHARGE:  05/07/2006                                 DISCHARGE SUMMARY   IDENTIFYING INFORMATION:  The patient is a 35 year old Caucasian female, who  was admitted on a voluntary basis to my service on May 05, 2006.   HISTORY OF PRESENT ILLNESS:  The patient has a history of depression with  vague, passive suicidal ideations, secondary to marital discord.  She has  been taking Xanax at night, but noncompliant with multiple trials of SSRIs.  She is not on any of them now.  Her medications have been prescribed by her  primary care physician.  She is diagnosed with borderline personality  disorder.  She endorsed positive suicidal ideations on the day prior to  admission, but denies today.  She is currently separated from her husband.   PAST PSYCHIATRIC HISTORY:  This is the first psychiatric admission for  patient.  She has had previous trials of numerous SSRIs without positive  response.  She does not take anything now.   FAMILY HISTORY:  Unclear.   SUBSTANCE ABUSE HISTORY:  Patient denies.   PAST MEDICAL HISTORY:  Medical problems:  None.   MEDICATIONS:  Must chew all medications.  Percocet for endometriosis.  Noncompliant with most medications.   DRUG ALLERGIES:  No known drug allergies.   PHYSICAL EXAMINATION:  The patient's physical exam was within normal limits.  There were no acute physical problems.   ADMISSION LABORATORY DATA:  CBC:  WBC is 6.4, hemoglobin is 14.8, hematocrit  is 43.8, platelets are 230.  Basic metabolic panel:  Sodium is 138,  potassium 3.8, chloride 101, CO2 is 29, BUN is 6, creatinine is 0.7, the  glucose is 95.  The hepatic panel:  SGOT is 14, SGPT is 11,  alkaline  phosphatase 83, total bilirubin is 0.8.  TSH is 0.815, which is within  normal limits.  The urinalysis was within normal limits.  Urine drug screen  is positive for benzodiazepines.   HOSPITAL COURSE:  Upon admission, the patient was placed on Percocet 5/325  p.o. q.6h. p.r.n. pain.  On May 06, 2006, the patient was started on Zoloft  25 mg p.o. daily.   Upon first meeting patient, she stated, I'm just depressed.  She stated  she had no insurance and could not get treatment in a timely manner.  She  wants medication for her depression.  She is going through a divorce, which  has been a stressor.  She states her impulsivity did not help the marriage.  She has had five surgeries (GYN).  She has two children, ages 64 and 78.  On  May 07, 2006, the patient reported feeling much better and wanted to go  home.  She reiterates that she never was suicidal at any time, but thinks  that she needed  the time to think and focus on her marriage, which she would  like to continue.  She was able to accept, however, that her husband may  want permanent divorce or separation, and she can cope with this.  She wants  to resume counseling with her previous therapist at [facility].  She  agrees to follow up at [facility] for medication management with the  Zoloft.  The patient's mental status had improved.  She was less depressed  and anxious, affect wider range.  There was no suicidal and homicidal  ideation, no self-injurious behavior, no auditory or visual hallucinations.  No delusions or paranoia.  Thoughts were logical and goal-directed.  Thought  content:  No predominant theme.  The cognitive exam was grossly within  normal limits and the patient was felt safe to return home.   DISCHARGE DIAGNOSES:  AXIS I.  Major depressive disorder, recurrent, severe,  without psychosis.  AXIS II.  Borderline personality disorder, by report.  AXIS III.  Chronic pain.  AXIS IV.  Moderate to severe  (marital separation).  AXIS V.  GAF upon discharge was 53.  GAF upon admission was 46.  GAF highest  past year was 67.   POST-HOSPITAL CARE PLANS:  There were no specific activity level or dietary  restrictions.   DISCHARGE MEDICATIONS:  Zoloft 25 mg p.o. daily.  It was felt this would be  beneficial, since it came in a generic form.   DISCHARGE FOLLOW-UP:  The patient will see Marcelino Duster at [facility].  She  was also made an appointment at [facility] for followup medication  management there.      Jasmine Pang, M.D.  Electronically Signed     BHS/MEDQ  D:  06/10/2006  T:  06/10/2006  Job:  [id number]

## 2011-03-19 NOTE — Discharge Summary (Signed)
NAME:  Gina [name], Gina [name] NO.:  [id number]   MEDICAL RECORD NO.:  [id number]          PATIENT TYPE:  OBV   LOCATION:  9320                          FACILITY:  [facility]   PHYSICIAN:  Freddy Finner, M.D.   DATE OF BIRTH:  [birth year]   DATE OF ADMISSION:  07/07/2004  DATE OF DISCHARGE:  07/08/2004                                 DISCHARGE SUMMARY   DISCHARGE DIAGNOSES:  1.  Pelvic endometriosis.  2.  Uterine adenomyosis.   OPERATIVE PROCEDURE:  Laparoscopic-assisted vaginal hysterectomy,  fulguration of pelvic endometriosis.   OPERATIVE AND POSTOPERATIVE COMPLICATIONS:  None.   DISPOSITION:  The patient was in satisfactory improved condition at the time  of her discharge.  She is to have progressively increasing physical activity  but no vaginal entry or heavy lifting.  She is to report fever, severe pain,  or heavy bleeding.  She is to take a regular diet.  She was given Percocet  5/325 to be taken as needed for postoperative pain.  She is to follow up in  the office in 2 weeks.  She was given Ambien 10 mg to be taken at bedtime as  needed for sleep.   Details of the present illness, past history, family history, review of  systems, and physical exam are recorded in the admission note.  Briefly, the  physical findings on admission remarkable only for the pelvic findings, and  these were more significant laparoscopically from previous surgery than  clinically.  The uterus did seem to be slightly enlarged.   Laboratory data during this admission includes admission hemoglobin of 12.1,  postoperative hemoglobin of 11.0.  Admission prothrombin time and PTT were  normal.  Urinalysis was normal.   HOSPITAL COURSE:  The patient was admitted on the morning of surgery.  She  was treated perioperatively with IV antibiotics and with PAS hose.  She was  taken to the operating room where the above-described procedure was  accomplished.  There were no intraoperative or  postoperative complications.  By the evening of postoperative day #1, she was in good condition, having  adequate bowel and bladder function.  She was discharged home with  disposition as noted above.     Hosie Spangle   WRN/MEDQ  D:  09/01/2004  T:  09/01/2004  Job:  [id number]

## 2011-03-19 NOTE — Discharge Summary (Signed)
NAME:  Gina [name], Gina [name]                         ACCOUNT NO.:  [id number]   MEDICAL RECORD NO.:  [id number]                   PATIENT TYPE:  INP   LOCATION:  9310                                 [facility]   PHYSICIAN:  Freddy Finner, M.D.                DATE OF BIRTH:  09-30-1976   DATE OF ADMISSION:  02/06/2004  DATE OF DISCHARGE:  02/07/2004                                 DISCHARGE SUMMARY   NOTE:  This is combined two discharge notes as basically the illness was  unchanged and it represented a rapid readmission following discharge.  Her  initial date of admission was February 06, 2004.  She was discharged home on  February 07, 2004, presented again on February 10, 2004 readmitting with the same  diagnosis and basically the same findings.  She had laparoscopy and  cystoscopy on February 14, 2004 - the former by me, the latter by Dr. Barron Alvine.   POSTOPERATIVE COMPLICATIONS:  None.   DISPOSITION:  The patient was considered to be in improved and satisfactory  condition at the time of her discharge on April 16 which represented a 1-day  hospitalization from April 7 to April 8 and a 5-day hospitalization from  April 11 to April 16.  She was instructed to have progressively-increasing  physical activity.  She is to follow up in the office in approximately 2  weeks.  She was given Percocet 5/325 to be taken as needed for postoperative  pain.  She was to take Phenergan elixir for nausea.  She was given Urispas  to be taken 100 mg t.i.d.  She was given Ambien 10 mg to be taken at bedtime  for sleep as needed.  She was given B&O suppositories to be used for pelvic  and bladder spasms.  She was given Ortho Evra 5% skin patch to use  continuously active patches for up to 9 weeks consecutive weeks.   PRESENT ILLNESS:  This lady's extensive recent illness is described in great  detail in her admission H&P of February 06, 2004.  On that day she presented to  my office in severe lower abdominal pain  and was admitted to the hospital  for pain management and further evaluation at that time.  After 24 hours the  patient was improved.  Her vital signs had been stable.  A lengthy  discussion was carried out with her about a plan for attempts to manage her  symptoms which were presumed to be related to pelvic endometriosis. At that  time she was discharged home on consecutive weeks of Ortho Evra with  Percocet for pain and Zofran for nausea.  She presented again and was  readmitted by Dr. Richardean Chimera in my absence on April 11.  She remained  hospitalized at that time until appropriate surgical intervention could be  arranged, which was accomplished on February 14, 2004.  At that time  I  performed laparoscopy with lysis of extensive omental adhesions thought to  be consistent with previous abdominal hernia repair and fulguration of  pelvic endometriotic lesions and uterosacral nerve ablation.  This was  followed by cystoscopy with over-distention  of bladder and with a finding of possible mild interstitial cystitis per Dr.  Isabel Caprice.  On the day following her surgery she was considered to be in  adequate condition for discharge and she was discharged with disposition and  medications as noted above.                                               Freddy Finner, M.D.    WRN/MEDQ  D:  03/06/2004  T:  03/06/2004  Job:  [id number]   cc:   Valetta Fuller, M.D.  [address]  [place]  [place] [address]  Fax: [phone]

## 2011-03-19 NOTE — Discharge Summary (Signed)
[facility]  Patient:    Gina [name], Gina [name] Visit Number: [id number] MRN: [id number]          Service Type: EMS Location: [facility] Attending Physician:  Devoria Albe Dictated by:   Dalbert Mayotte, M.D. Admit Date:  04/21/2002 Discharge Date: 04/22/2002                             Discharge Summary  DISCHARGE DIAGNOSES:          1. Cesarean section without complications.                               2. Delivery of single liveborn.  DISCHARGE MEDICATIONS:        1. Motrin 600 mg one tablet p.o. q.6h. p.r.n.                                  pain.                               2. Darvocet-N 100 one tablet p.o. q.6h. p.r.n.                                  pain.                               3. Prenatal vitamins one p.o. q.d.  CONSULTING PHYSICIANS:        None.  PROCEDURE:                    Low transverse cesarean section.  COMPLICATIONS:                None.  DISCHARGE INSTRUCTIONS:       Activity; the patient is to increase activity as tolerated.  No heavy lifting for the next two weeks.  Diet; she is to resume a regular diet.  FOLLOW-UP:                    She is to follow up in six weeks at [facility] with Dr. Drue Second.  HISTORY OF PRESENT ILLNESS:   The patient is a gravida 3, para 2-0-1-2, with a history of previous cesarean section who presented for elective repeat cesarean section at 40-3/[redacted] weeks gestation.  HOSPITAL COURSE:              On November 20, at 0911 a viable female infant was delivered via low transverse cesarean section with Apgars of 9 at one minute and 9 at five minutes, weighing 7 pounds 11 ounces with no complications.  She recovered nicely and during her hospitalization, did not have any other complications.  She was tolerating p.o. well with her pain adequately controlled on p.o. pain medicines.  She was ambulating, had had a bowel movement, and was deemed stable for discharge home on September 23, 2001.  DISCHARGE LABORATORY DATA:    Hemoglobin 12.2, hematocrit 30.4. Dictated by:   Dalbert Mayotte, M.D. Attending Physician:  Devoria Albe DD:  04/27/02 TD:  04/30/02 Job: [id number]

## 2011-03-19 NOTE — Op Note (Signed)
NAME:  Gina [name], Gina [name] NO.:  [id number]   MEDICAL RECORD NO.:  [id number]          PATIENT TYPE:  OBV   LOCATION:  1614                         [facility]ACILITY:  [facility]   PHYSICIAN:  Carrington Clamp, M.D. DATE OF BIRTH:  01/03/1976   DATE OF PROCEDURE:  04/16/2005  DATE OF DISCHARGE:                                 OPERATIVE REPORT   PREOPERATIVE DIAGNOSES:  1.  History of endometriosis and left lower quadrant pain.  2.  Abdominal hernia.   POSTOPERATIVE DIAGNOSES:  1.  History of endometriosis and left lower quadrant pain.  2.  Abdominal hernia.   PROCEDURE:  For Dr. Henderson Cloud was diagnostic laparoscopy, cautery of  endometriosis.  I assisted on the abdominal hernia repair, but that will be  dictated by Dr. Johna Sheriff.   SURGEON:  Dr. Henderson Cloud   ASSISTANT:  For this part of the procedure is Dr. Johna Sheriff.  He is to  dictate his hernia repair.   ANESTHESIA:  General.   SPECIMENS:  None.   ESTIMATED BLOOD LOSS:  Minimal.   IV FLUIDS:   URINE OUTPUT:   COMPLICATIONS:  None.   FINDINGS:  Normal-appearing ovaries without evidence of endometriomas.  There was no endometriosis along the left side, and the left portion of the  colon had been examined as carefully as possible.  There was a small amount  of endometriosis in the right anterior abdominal wall.  There was a small  adhesion of the omentum to the anterior abdominal wall hernia.  There were  otherwise no other adhesions or problems with appendix, liver edge, or  vaginal cuff.  Counts were correct x 3.  Medications were none.   TECHNIQUE:  After adequate anesthesia was achieved, the patient was prepped  and draped in the usual sterile fashion in the dorsolithotomy position with  a sponge-stick in the vagina and a Foley catheter in place.  Dr. Johna Sheriff  will dictate the entry into the abdomen with the trocars.  A 5 mm trocar was  placed on the left-hand side of the patient under direct visualization with  the camera, and a systematic approach examination of the abdomen was  undertaken.  A small amount of endometriosis along the anterior abdominal  wall was cauterized with a harmonic scalpel.  I then assisted Dr. Johna Sheriff  with the anterior abdominal wall repair which he will dictate, and he  removed the trocars and finished the operation.  The sponge-stick and Foley  were removed.       MH/MEDQ  D:  04/16/2005  T:  04/16/2005  Job:  [id number]

## 2011-03-19 NOTE — Consult Note (Signed)
NAME:  Gina [name], Gina [name]                         ACCOUNT NO.:  [id number]   MEDICAL RECORD NO.:  [id number]                   PATIENT TYPE:  INP   LOCATION:  5707                                 FACILITY:  [facility]   PHYSICIAN:  Deanna Artis. Sharene Skeans, M.D.           DATE OF BIRTH:  1976-03-27   DATE OF CONSULTATION:  06/10/2003  DATE OF DISCHARGE:                                   CONSULTATION   CHIEF COMPLAINT:  Back, abdominal pain, and pelvic pain.   HISTORY OF PRESENT ILLNESS:  I was asked by Dr. Luther Parody to see Gina [name] for evaluation of pain in her right buttock region extending down  her right.  This has been present for a couple of days and was not initially  present when the patient was hospitalized with very severe intermittent  abdominal pain.   The patient has had extensive workup by general surgery and GI.  Neurology  and Ob/Gyn have been asked to see the patient to carry out further workup.   The patient is thought, by Dr. Luther Parody, to have irritable bowel syndrome  and possibly functional pain.  The patient has had normal barium enema and  CT scan and also small bowel series.  Tonight, she has had MRI of the pelvis  and lumbosacral spine.  Review of these studies fails to show any definite  abnormality, although they need to be formally read.   It is my understanding the patient has had other imaging studies prior to  hospitalization.  The patient has been shown to have a couple of small  hernias in the abdominal wall that are unlikely to be the cause of her pain.  Dr. Lurene Shadow has suggested laparoscopy which I think may be a very good idea.  I am concerned that the patient may have some form of endometriosis.  She  had been placed on Depo-Provera.  Her last injection was in May.  She  continued to have breakthrough bleeding throughout the entire three months.  She describes the pain as feeling like little pins and needles in her pelvis  and vaginal region.  This  is exacerbated by movement.  She has also had  severe sharp pains that doubled her over.  Her pain has been present for  about two weeks with intermittent nausea and vomiting and diarrhea.  The  patient has a past history of bulimia.  She has two incisional hernias from  previous cesarean sections.   The buttock pain is described as sharp in nature extending from the ischial  tuberosity and sciatic notch distally and a pseudoradicular fashion.  It has  been unassociated with numbness, weakness, or change in bladder control.   REVIEW OF SYSTEMS:  Total system review is otherwise unremarkable.   PAST SURGICAL HISTORY:  Cesarean section x2.   FAMILY HISTORY:  Unremarkable for significant lumbar spondylosis or for  undefined back and leg pain.  SOCIAL HISTORY:  The patient is married and has two children.  She smokes 15  cigarettes per day, although has smoked less in the hospital.  She does not  use alcohol or drugs.   MEDICATIONS:  1. Depo-Provera.  2. Injectable Vioxx 50 mg per day.  3. Hyoscyamine.  4. OxyContin.   In the hospital, the patient has received:  1. Amitriptyline.  2. NuLev.  3. She had been on Zelnorm.  This was stopped.  4. She has also received Demerol.   DRUG ALLERGIES:  CODEINE which makes her SICK (INTOLERANCE) and NICOTINE  PATCH which causes HIVES.   PHYSICAL EXAMINATION:  GENERAL:  Attractive, blonde, thin right-handed young  woman in no acute distress.  VITAL SIGNS:  Temperature 97.1, blood pressure 84/50, resting pulse 58,  respirations 16, pulse oximetry 98% on room air.  HEENT:  No signs of infection.  NECK:  Supple, full range of motion, no cranial or cervical bruits.  LUNGS:  Clear to auscultation.  HEART:  No murmurs.  Pulses normal.  ABDOMEN:  Soft, bowel sounds normal.  No hepatosplenomegaly.  EXTREMITIES:  Unremarkable.  NEUROLOGIC:  The patient was awake, alert, no dysphasia or dyspraxia.  Cranial nerves, round reactive pupils.  Normal  fundi.  Visual fields full to  double simultaneous stimuli.  Extraocular movements full and conjugate.  Okay and responsive equal symmetric facial strength and sensation.  She is  able to protrude her tongue and elevate, uvula midline.  Air conduction  greater than bone conduction bilaterally.  Motor examination, normal  strength, except for her left hip flexor which is 4/5.  This was reduced  secondary to pain.  Fine motor movements were normal.  There was no drift  sensation intact to cold vibration, stereoagnosis, cerebellar examination,  good finger-to-nose, fine rapid repetitive movements.  Gait is antalgic,  especially in tandem.  Decreased range of motion of her back in all  directions.  Deep tendon reflexes are normal to brisk.  No clonus.  The  patient had bilateral flexor plantar response.   IMPRESSION:  Buttock pain, radiating down her leg, appears to be  pseudoradicular in nature.  There is no evidence of nerve root compression,  no signs of neuropathy or myelopathy.  Her back muscles are tight which is  likely a reaction to her pain.  The etiology of her abdominal pain is  unknown.  Whether this would represent endometriosis is unclear.  I do not  believe this woman has porphyria.  It is very curious that Depo-Provera did  not stop her menstrual flow.   RECOMMENDATIONS:  1. Formal review of the pelvic MRI and spine by radiology.  2. Physical therapy consult for gait and back to decrease pain and increase     mobility.  3.     I doubt the utility of EMGs and nerve conductions.  4. Gynecological consult with her physician.  5. Like everyone else, I am skeptical that herniorrhaphies will help this     woman.                                               Deanna Artis. Sharene Skeans, M.D.    [facility]  D:  06/10/2003  T:  06/11/2003  Job:  [id number]   cc:   Leonie Man, M.D.  [address] [place]  [place]  [address]  Fax: [phone]   Dineen Kid. Rana Snare, M.D.  [address]  [place]  [place] [address]  Fax: [phone]   Deboraha Sprang at [facility]

## 2011-03-19 NOTE — Op Note (Signed)
NAME:  Gina [name], Gina [name]                         ACCOUNT NO.:  [id number]   MEDICAL RECORD NO.:  [id number]                   PATIENT TYPE:  INP   LOCATION:  9310                                 FACILITY:  [facility]   PHYSICIAN:  Valetta Fuller, M.D.               DATE OF BIRTH:  November 15, 1975   DATE OF PROCEDURE:  02/14/2004  DATE OF DISCHARGE:  02/15/2004                                 OPERATIVE REPORT   PREOPERATIVE DIAGNOSES:  1. Chronic pelvic pain.  2. Possible interstitial cystitis.   POSTOPERATIVE DIAGNOSES:  1. Chronic pelvic pain.  2. Possible interstitial cystitis.   PROCEDURE PERFORMED:  Cystoscopy with hydraulic overdistention of the  bladder.   SURGEON:  Valetta Fuller, M.D.   ANESTHESIA:  General anesthesia.   INDICATIONS FOR PROCEDURE:  Ms. Croson is a young female who has undergone  a multitude of evaluations because of chronic pelvic pain.  She has been  evaluated by a wide variety of gynecologists.  She has also been under the  care of chronic pain management clinic in [place], [place].  She  has been diagnosed with endometriosis in the past, although it is not  entirely clear how severe that is.  She has responded to hormonal therapy  with Lupron in the past but that became less effective.  She has been  admitted with chronic pelvic pain.  During some of her exacerbations of  pelvic pain, she does have some increased voiding symptoms.  I carefully  evaluated her and felt that her story was not terribly consistent with  interstitial cystitis but certainly that remained a possibility.  Dr. Konrad Dolores planned on doing a diagnostic laparoscopy to further evaluate for  endometriosis and wondered if we would want to do a concurrent cystoscopy  with hydraulic overdistention of the bladder to assess for interstitial  cystitis.  Because of the exact etiology of her chronic pelvic pain has been  elusive, we felt it prudent to try to eliminate all possible  causes.   DESCRIPTION OF PROCEDURE:  The patient has already had successful induction  of general anesthesia and was in the lithotomy position.  Dr. Jennette Kettle had  finished his laparoscopic evaluation and treatment and will be dictated  separately.  We went ahead with rigid cystoscopy.  The bladder was carefully  endoscoped and no abnormalities were appreciated.  She underwent hydraulic  overdistention to approximately 80 cm of water pressure for five minutes.  Capacity was measured in 1400 mL.  The patient had a few scattered mild  glomerular hemorrhaging.  I felt that this was again fairly scattered and  not real diffuse.  The glomerular areas were relatively mild.  I felt that  this cystoscopic evaluation could be consistent with interstitial cystitis  but certainly she had no evidence of more severe interstitial  cystitis  given the bladder capacity and the endoscopic findings today.  At  the completion of the procedure, I went out and discussed these findings  with her mother.  She appeared to tolerate the procedure well.  There were  no obvious complications.                                               Valetta Fuller, M.D.    DSG/MEDQ  D:  02/14/2004  T:  02/17/2004  Job:  [id number]   cc:   Freddy Finner, M.D.  [address] [place]  [place] [address]  Fax: [phone]

## 2011-03-19 NOTE — Op Note (Signed)
NAME:  Gina [name], Gina [name]                         ACCOUNT NO.:  [id number]   MEDICAL RECORD NO.:  [id number]                   PATIENT TYPE:  AMB   LOCATION:  DAY                                  [facility]ACILITY:  [facility]   PHYSICIAN:  Cynthia P. Romine, M.D.             DATE OF BIRTH:  1976/02/06   DATE OF PROCEDURE:  06/18/2003  DATE OF DISCHARGE:                                 OPERATIVE REPORT   PREOPERATIVE DIAGNOSES:  1. Pelvic pain, etiology undetermined.  2. Known small ventral hernia.   POSTOPERATIVE DIAGNOSES:  1. Minimal endometriosis.  2. Ventral hernia.   PROCEDURE:  1. Diagnostic laparoscopy, laser vaporization of endometriosis by Edwena Felty.     Romine, M.D.  2. Repair of ventral hernia which will be dictated separately by Leonie Man, M.D.   ANESTHESIA:  General endotracheal.   ESTIMATED BLOOD LOSS:  Minimal.   COMPLICATIONS:  None.   DESCRIPTION OF PROCEDURE:  The patient was taken to the operating room and  after the induction of adequate general endotracheal anesthesia was placed  in dorsal lithotomy position and prepped and draped in the usual fashion. A  Hulka uterine manipulator was placed, bladder was drained with a red rubber  catheter. A small subumbilical incision was made, the Veress needle was  inserted into the peritoneal space. Proper placement was tested by noting a  negative aspirate and then free flow of saline through the Veress needle  again with a negative aspirate and then by noting the response, a drop of  saline placed at the hub of the Veress needle as the abdominal wall was  elevated. Pneumoperitoneum was created with 3.5 liters of CO2 using the  automatic insufflator. A disposable 10/11 mm trocar was then inserted into  the peritoneal space and there was proper placement noted with the  laparoscope. Laparoscopy revealed the uterus, tubes and ovaries to be  normal. In the posterior cul-de-sac on the patient's right near the  uterosacral ligaments was a small powder burn area of endometriosis. It was  approximately 4 mm x 4 mm and on the left uterosacral ligament there was a  smaller approximately 2 mm x 2 mm area of powder burn type endometriosis.  The YAG laser was used to the power setting of 15 watts to vaporize the  endometriosis. Dr. Lurene Shadow then inspected the upper abdomen including the  liver, the gallbladder, the stomach, the intestine and the appendix all of  which appeared normal. There was some fat into a small ventral hernia which  was repaired by Dr. Lurene Shadow and will be dictated separately. Upon completing  the ventral hernia repair, the laparoscopic incisions were closed  subcuticularly with 4-0 Maxon in the suprapubic incision and 2-0 Vicryl in  the umbilical incision. Instruments removed from the vagina and the  procedure was terminated.  Cynthia P. Romine, M.D.   CPR/MEDQ  D:  06/18/2003  T:  06/18/2003  Job:  [id number]

## 2011-03-19 NOTE — Discharge Summary (Signed)
NAME:  Gina [name], Gina [name]                         ACCOUNT NO.:  [id number]   MEDICAL RECORD NO.:  [id number]                   PATIENT TYPE:  INP   LOCATION:  5707                                 FACILITY:  [facility]   PHYSICIAN:  James L. Malon Kindle., M.D.          DATE OF BIRTH:  [birth year]   DATE OF ADMISSION:  06/06/2003  DATE OF DISCHARGE:  06/13/2003                                 DISCHARGE SUMMARY   ADMISSION DIAGNOSIS:  Intermittent abdominal pain, etiology undetermined.   FINAL DIAGNOSIS:  Intermittent abdominal pain, etiology undetermined.   HISTORY:  This is a very-pleasant 35 year old woman who had been reasonably  well up until two weeks ago.  She had abdominal pain, cramping and bloating,  and was seen by multiple physicians including Dr. Lowell Guitar and Dr. Abbey Chatters,  her gynecologist, as well as several ER visits.  She had been on Depo-  Provera for birth control.  This had been placed on hold.  She was having  pain in her pelvic area radiating across her entire abdomen.  No vomiting  unrelieved by bowel movements or flatus.  She had had labs, CT scan and  abdominal series, all failing to show any etiology other than umbilical  hernia in the right lower quadrant, and abdominal wall hernia.  The  questions was whether or not she needed to have the hernias repaired.  She  came into the office on her day of admission doubled over with pain, nausea  and vomiting.  Acute abdominal series did not show obstruction.  White count  was normal at 5700.  She had a negative urine pregnancy test x2 and negative  urinalysis other than one study showing 21-50 red cells that was taken while  she was having her menses.  Due to the severe pain, she was admitted for  further diagnosis and therapy.   PHYSICAL EXAMINATION:  VITAL SIGNS:  Remarkable for stable vital signs.  She  was afebrile.  HEART/LUNG:  Exam was normal.  ABDOMEN:  Nondistended with good bowel sounds with pain in the right  lower  quadrant in the vicinity of the abdominal wall hernia, although there did  not appear to be any bowel in the hernia sac itself.  The stool was brown  and negative in the office.  For more details, please see dictated admission history and physical.   HOSPITAL COURSE:  The patient was admitted to a medical floor and seen in  consultation by Dr. Leonie Man who continued to be puzzled as everyone  else about the etiology of her pain.  She had several studies performed  including diagnostic small bowel series that was normal, acute abdominal  series that was normal, barium enema, but with Gastrografin it was normal.  Complete abdominal ultrasound was normal.  MRI of the pelvis and lumbar  spine was done.  There was no evidence of disk disease or any abnormalities  of the bony  structures.  All of the patient's labs including CBC, CMET and  sed rate were all normal.  TSH and T4 were normal.  Urine pregnancy was  repeated and was negative for pregnancy.   The patient continued to have pain that required narcotics and a very-poor  p.o. intake.  She was seen in consultation by Dr. Sharene Skeans for neurological  evaluation.  His feeling was that this was not due to nerve root compression  or any neuropathy.  It was not felt that nerve conductions, etc. were  needed.   She was seen in consultation in gynecology by Dr. Tresa Res who felt that she  had atypical lower abdominal pelvic pain with no obvious GYN etiology.  He  felt that diagnostic laparoscopy would be the best test with general surgery  and GYN present at the time of surgery with possibility of fixing her  hernias at that time.   The patient had a flexible sigmoid performed by Dr. Althea Grimmer. Santogade which  was normal.   After discussion by Dr. Lurene Shadow and Dr. Tresa Res, it was felt the patient could  be discharged home and arrangements made for the two of them to get together  and proceed with a diagnostic laparoscopy and  possible  laparotomy.  She was  agreeable with this.   DISPOSITION:  The patient is discharged home.   DISCHARGE MEDICATIONS:  1. Amitriptyline 10 mg q.h.s.  2. Percocet 5/325 q.6h. p.r.n.  3. NuLev p.r.n.   DISCHARGE DIET:  She will be on a low-residue diet.   FOLLOWUP:  1. Dr. Lurene Shadow.  2. Dr. Tresa Res.                                                James L. Malon Kindle., M.D.    Waldron Session  D:  08/01/2003  T:  08/02/2003  Job:  [id number]   cc:   Aram Beecham P. Romine, M.D.  [address]  [place]  [place] [address]  Fax: [phone]   Deanna Artis. Sharene Skeans, M.D.  [address]  [place]  [place] [address]  Fax: [phone]   Leonie Man, M.D.  [address]  [place]  [place] [address]  Fax: [phone]   Dr. ___________, Deboraha Sprang, [facility]

## 2011-03-19 NOTE — Op Note (Signed)
NAME:  Gina [name], Gina [name]               ACCOUNT NO.:  [id number]   MEDICAL RECORD NO.:  [id number]          PATIENT TYPE:  INP   LOCATION:  1310                         FACILITY:  [facility]   PHYSICIAN:  Sharlet Salina T. Hoxworth, M.D.DATE OF BIRTH:  June 09, 1976   DATE OF PROCEDURE:  12/28/2005  DATE OF DISCHARGE:                                 OPERATIVE REPORT   PRE AND POSTOPERATIVE DIAGNOSIS:  Recurrent ventral incisional hernia.   SURGICAL PROCEDURES:  Open repair of recurrent ventral incisional hernia  with mesh.   SURGEON:  Dr. Johna Sheriff.   ANESTHESIA:  General.   BRIEF HISTORY:  Avelyn Touch is 35 year old female with a history of a low  midline ventral incisional hernia following a GYN surgery. This previously  had been repaired primarily and subsequently recurred at which point a  number of months ago, I had performed a laparoscopic repair with a was Kugel  mesh patch. She initially did well then developed recurrent pain at the  repair site and on subsequent follow-up has been found to have a small but  definite recurrent hernia about 5 cm below the umbilicus in the midline.  After discussion of options, we elected to proceed with repair using open  technique with onlay mesh.  The nature of the procedure, indications, risks  of bleeding, infection, chronic pain and recurrent hernia were discussed and  understood. She is now brought to the operating room for this procedure.   DESCRIPTION OF PROCEDURE:  The patient was brought to the operating room and  placed in supine on the operating table and general orotracheal anesthesia  was induced. She received preoperative IV antibiotics in the holding area.  The lower abdomen was widely sterilely prepped and draped using Ioban.  Correct patient and procedure were verified. A portion of the infraumbilical  midline incision was used in the upper part over the area of recurrence and  dissection was carried down to the subcutaneous tissue.  Dissection was  deepened down to the fascia. I dissected down to the fascial defect which of  course was still present due to the nature of the previous laparoscopic  repair. The defect measured about 2.5 cm in length by 1 cm in width. There  was some fatty tissue protruding through this. Grossly, I could not be sure  if this was omentum or preperitoneal fat. Dissecting down through this, the  mesh was intact below this and did not appear to be bulging up through the  defect but the tissue between the mesh and the fascial defect was clearly  herniating through the defect. I then excised the excess fatty tissue coming  through the defect between clamps and tied with 3-0 Vicryl ties. The fascial  edges were then freshened up slightly and closed with inverting interrupted  0 Prolene sutures. Subcutaneous flaps had been raised back 2.5 cm laterally  in either direction and about 5 cm superiorly and inferiorly along the  midline incision and there were no other areas of weakness with all the  fascia feeling normal along the remainder of the incision. An onlay patch of  Parietex  mesh was fashioned which measured 10 x 6 cm. This was then sutured  in place widely covering the repair and tacked at its periphery with  interrupted 2-0 Prolene sutures. Following this, all soft tissue was  infiltrated with Marcaine. The wound was irrigated and complete hemostasis  assured. The subcutaneous tissue was then closed with a running 3-0  Monocryl incorporating small bites of the fascia along the midline to close  the dead space. The skin was then closed with running subcuticular 4-0  Monocryl and Steri-Strips.  Sponge, needle and instrument counts were  correct. Dry sterile dressings were applied. The patient taken to recovery  in good condition.      Lorne Skeens. Hoxworth, M.D.  Electronically Signed     BTH/MEDQ  D:  12/28/2005  T:  12/29/2005  Job:  [id number]

## 2011-03-19 NOTE — H&P (Signed)
NAME:  Gina [name], Gina [name]                         ACCOUNT NO.:  [id number]   MEDICAL RECORD NO.:  [id number]                   PATIENT TYPE:  INP   LOCATION:  1830                                 FACILITY:  [facility]   PHYSICIAN:  James L. Malon Kindle., M.D.          DATE OF BIRTH:  1976/07/11   DATE OF ADMISSION:  06/06/2003  DATE OF DISCHARGE:                                HISTORY & PHYSICAL   REASON FOR ADMISSION:  Continued abdominal pain.   HISTORY:  35 year old woman who had been reasonably well up until  approximately two weeks ago.  She has had regular bowel movements and about  two weeks ago began having bloating, severe pain and cramping.  She has seen  multiple physicians including several ER visits.  She has seen Dr. Lurene Shadow,  Dr. Abbey Chatters, and her gynecologist.  She had been on Depo Provera for  birth control, this was causing bleeding and this had been on hold and she  still has been having some bleeding during the past several weeks, but this  pain has been more across her abdomen, not down in her pelvic area, and  radiated across her entire abdomen.  She has had no vomiting.  The pain is  unrelieved by bowel movements or flatus.  She has been found on all this  work up including labs and CT scans, abdominal series, to have an umbilical  hernia and right lower quadrant abdominal wall hernia.  There is nothing  bulging through the hernia sacs and have not been really impressive on CT  scan although on exam she has been somewhat tender in this area although  there has been nothing in the hernia sac that several physicians have  checked, including myself in the office two days ago.  The patient has seen  Dr. Lurene Shadow who wanted further work up done prior to fixing these hernias.  The patient work up today and has been doubled over with severe pain, coming  to the emergency room and has actually had to have a pain shot.  She has had  some nausea and vomiting.  Acute abdominal  series did not show any  obstruction.  White count is normal at 5700.  She has had a negative urine  pregnancy x2.  Urinalysis obtained during her last visit four days ago  showed 21 to 50 red cells but she was having her period at that time.   CURRENT MEDICATIONS:  1. Percocet.  2. Vioxx.  3. The patient has been on Depo Provera but is coming off of this.   ALLERGIES:  NICOTINE PATCHES AND CODEINE.   PAST MEDICAL HISTORY:  The patient has a history of an eating disorder, as a  young woman she took purges and laxatives, had bulimia.   SOCIAL HISTORY:  Her only surgery has been two C-sections.   FAMILY HISTORY:  Mother is 18, has RDS.  Father has hypertension and  diabetes.  She has a 43 year old sibling who is healthy.  She has two  children, both are healthy, ages 6 years and 2 months.   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  She is married.  Smokes 15 cigarettes a day and does not drink.  Has  traveled recently to the beach.   REVIEW OF SYSTEMS:  Remarkable for lack of GI symptoms prior to this.  She  has had some weight gain and occasional heart burn and indigestion with some  abdominal bloating and vomiting.  Her bowel movements tend to be soft, no  loose, one or two a day with occasional constipation.   PHYSICAL EXAMINATION:  VITAL SIGNS: The patient is afebrile, vital signs  normal.  GENERAL: Pleasant white female in no acute distress.  HEENT: Eyes are clear, non icteric.  Extraocular movements are intact.  Throat reveals mucous membranes to be moist, no lesions seen in the throat.  NECK: Supple without lymphadenopathy.  ABDOMEN: Non distended with excellent bowel sounds.  There is a small  palpable hernia right above the umbilicus and patient's tenderness seems to  be located more in the right lower quadrant in the area of the right lower  quadrant abdominal wall herniation although I cannot palpate any bowel in  the hernia sac  itself.  The remainder of the abdomen is generally nontender  although the tenderness does seem to radiate over to the left lower quadrant  and around the left side.  RECTAL: Not repeated.  Stool was brown and heme negative in the office two  days ago.   ASSESSMENT:  Intermittent abdominal pain - this appears to be severe and I  suspect it is somewhat related to adhesions or to the abdominal wall  hernias.  The patient does not seem to have chronic symptoms to indicate  Crohn's disease but this certainly would have been seen on two CT scans.  I  suspect that the hematuria was probably related to her period.  We need to  repeat that and possibly even do a CT by renal protocol to rule out a renal  stone.   PLAN:  1. Will admit, check urine pregnancy, will repeat urinalysis, go ahead with     a urine culture.  2. Will discuss with Dr. Lurene Shadow.  She will likely need a barium enema, CT     small bowel, possibly a laparoscopy.                                                James L. Malon Kindle., M.D.    Waldron Session  D:  06/06/2003  T:  06/06/2003  Job:  [id number]   cc:   Leonie Man, M.D.  [address]  [place]  [place] [address]  Fax: [phone]   Dineen Kid. Rana Snare, M.D.  [address]  [place]  [place] [address]  Fax: [phone]   Deboraha Sprang at [facility]

## 2011-03-19 NOTE — Op Note (Signed)
NAME:  Gina [name], Gina [name]               ACCOUNT NO.:  [id number]   MEDICAL RECORD NO.:  [id number]          PATIENT TYPE:  OBV   LOCATION:  1614                         FACILITY:  [facility]   PHYSICIAN:  Sharlet Salina T. Hoxworth, M.D.DATE OF BIRTH:  02-01-1976   DATE OF PROCEDURE:  04/16/2005  DATE OF DISCHARGE:                                 OPERATIVE REPORT   PREOPERATIVE DIAGNOSIS:  Ventral hernia.   POSTOPERATIVE DIAGNOSIS:  Ventral hernia.   SURGICAL PROCEDURES:  Laparoscopic repair of ventral hernia.   SURGEON:  Dr. Johna Sheriff   ASSISTANT:  Dr. Henderson Cloud   BRIEF HISTORY:  Gina [name] is a 35 year old female, who has a previous  history of a suture repair of a primary infraumbilical hernia in 2004.  Within the last year or so, she has noted recurrent pain and bulge below the  umbilicus.  Examination has confirmed a quite tender reducible hernia in the  midline below the umbilicus.  She also has chronic abdominal pain, history  of endometriosis, and requires laparoscopy.  We have elected to proceed in  conjunction with Dr. Kittie Plater laparoscopy for endometriosis to repair her  hernia laparoscopically.  The nature of the procedure, its indications,  risks of bleeding, infection, bowel injury, recurrence, and possible need  for open procedure were discussed and understood.  She is brought to the  operating room for this procedure.   DESCRIPTION OF PROCEDURE:  The patient brought to the operating room, placed  in supine position, and general endotracheal anesthesia induced.  She  received preoperative broad-spectrum antibiotics.  She was placed in  lithotomy position with a full abdominal and vaginal prep.  Access was  obtained in the right flank with an 11 mm OptiVu trocar without difficulty  and pneumoperitoneum established.  Under direct vision, 5 mm trocars were  placed well laterally in either lower quadrant.  Dr. Henderson Cloud then proceeded  with a diagnostic laparoscopy and ablation of  endometriosis as she dictated  separately.   Following completion of Dr. Kittie Plater procedure, a small area of omental  adhesions of the hernia were taken down.  The hernia defect was measured  internally at 6 x 2 cm running along the midline below the umbilicus.  A 10  x 15 piece of Bard XL mesh was chosen for repair.  A single transfixion  suture was placed in the center of the mesh for orientation and was  introduced in the abdominal cavity, unfurled, and oriented appropriately.  The single transfixion suture was then brought up through the center of the  hernia defect for orientation using a suture grasper.  The mesh was then  carefully unfurled completely and positioned through its long axis  superiorly and inferiorly.  The Endo tacker was then used to tack initially  the four quadrants of the mesh with complete deployment.  The mesh was then  tacked circumferentially around its periphery and then a second inner row  placed around the hernia defect.  This appeared to provide complete broad  coverage of the defect.  Following this, the abdomen was inspected for  hemostasis.  The trocars removed  under direct vision; all CO2 was evacuated.  Skin incisions were closed with interrupted Monocryl and Steri-Strips.  Sponge, needle, and instrument counts were correct.  The patient was taken  recovery in good condition.       BTH/MEDQ  D:  04/16/2005  T:  04/16/2005  Job:  [id number]

## 2011-03-19 NOTE — Discharge Summary (Signed)
   NAME:  Gina [name], ACCARDO                         ACCOUNT NO.:  [id number]   MEDICAL RECORD NO.:  [id number]                   PATIENT TYPE:  OBV   LOCATION:  9306                                 FACILITY:  [facility]   PHYSICIAN:  Roseanna Rainbow, M.D.         DATE OF BIRTH:  14-Sep-1976   DATE OF ADMISSION:  08/22/2003  DATE OF DISCHARGE:  08/23/2003                                 DISCHARGE SUMMARY   CHIEF COMPLAINT:  Patient is a 35 year old gravida 3 para 2 complaining of  lower abdominal pain.   HISTORY OF PRESENT ILLNESS:  The patient carries a diagnosis of  endometriosis.  She has a long history of chronic pelvic pain syndrome.  The  patient was recently hospitalized approximately 2 weeks ago for pain  control.  A Mirena IUD was placed at that time.  She denies any concomitant  complaints.   MEDICATIONS:  Percocet.   ALLERGIES:  NICOTINE PATCH, CODEINE.   PAST OBSTETRICAL/GYNECOLOGICAL HISTORY:  As above; she is status post two  cesarean deliveries and one spontaneous abortion.   PAST MEDICAL HISTORY:  She denies.   PAST SURGICAL HISTORY:  As above; herniorrhaphy, laparoscopy, and D&C.   SOCIAL HISTORY:  Current tobacco use.   PHYSICAL EXAMINATION:  VITAL SIGNS:  Stable, afebrile.  GENERAL APPEARANCE:  Well-developed, well-nourished female in no apparent  distress.  ABDOMEN:  Soft, normoactive bowel sounds, nontender.  PELVIC:  Exam deferred.   LABORATORY WORK:  Urine pregnancy test negative.  Urinalysis negative.  CBC  with white count of 6700.  CMET __________.   ASSESSMENT:  1. Chronic pelvic pain syndrome.  2. Endometriosis.   PLAN:  Twenty-three-hour observation and pain management.   HOSPITAL COURSE:  The patient was admitted.  She received Celebrex for pain.  She also received a dose of Lupron Depot 11.25.  She was discharged to home  tolerating a regular diet with improvement in her pain.   DISCHARGE DIAGNOSES:  1. Chronic pelvic pain syndrome.  2.  Endometriosis.   CONDITION:  Stable.   DIET:  Regular.   ACTIVITY:  Ad lib.   MEDICATIONS:  Resume home medications.   DISPOSITION:  The patient is to follow up at the office in 2 weeks.                                               Roseanna Rainbow, M.D.    Judee Clara  D:  09/10/2003  T:  09/10/2003  Job:  [id number]

## 2011-03-19 NOTE — Op Note (Signed)
NAME:  NEOMIA, HERBEL               ACCOUNT NO.:  [id number]   MEDICAL RECORD NO.:  [id number]          PATIENT TYPE:  AMB   LOCATION:  DAY                          FACILITY:  [facility]   PHYSICIAN:  Sharlet Salina T. Hoxworth, M.D.DATE OF BIRTH:  1976-01-09   DATE OF PROCEDURE:  12/05/2006  DATE OF DISCHARGE:                               OPERATIVE REPORT   PRE AND POSTOPERATIVE DIAGNOSIS:  Abdominal pain status post ventral  hernia repair.   SURGICAL PROCEDURES:  Laparoscopy and open removal of abdominal wall  Prolene suture.   SURGEON:  Dr. Johna Sheriff.   ASSISTANT:  None.   ANESTHESIA:  General.   BRIEF HISTORY:  Katelan Hirt is a 35 year old female with a complicated  history following a ventral hernia repair.  She initially had an open  ventral hernia repair of the low midline incision from a C-section  performed several years ago.  She subsequently developed a recurrence  and about two years ago, I performed a laparoscopic repair.  She  developed a recurrent painful bulge in the low-lying midline incision  and I performed an anterior repair with onlay mesh after closing the  fascial defect.  She had what appeared to be preperitoneal fat bulging  up through the previous fascial defect at that time.  She subsequently  had a good pain relief and a solid feeling abdominal wall but now has  presented with recurrent pain around her incision.  For the most part  this has been localized just to the left of her incision in the superior  part just below the umbilicus and on exam in the office has a pinpoint  trigger area of pain that has been reproducible.  We have not been able  to demonstrate a recurrent hernia on CT scan or physical exam, although  this has been difficult to demonstrate in the past when she has had a  hernia.  Her pain has failed to improve and actually has worsened with  conservative management and observation.  We have elected to proceed  with surgical exploration.  I  have recommended laparoscopy for a careful  examination of the lower abdominal wall to rule out any evidence of  recurrent hernia and also direct local exploration anteriorly of the  painful pinpoint area for possible neuroma, painful stitch, etc.  The  nature of procedure, its  indications, risks of bleeding, infection,  bowel injury and persistent pain had been discussed understood.  She is  now brought to the operating room for this procedure.   DESCRIPTION OF OPERATION:  The patient was brought to the operating room  placed in supine position on operating table and general endotracheal  anesthesia was induced.  She received preoperative IV antibiotics.  The  abdomen was widely sterilely prepped and draped.  Correct patient and  procedure were verified.  Local anesthesia was used to infiltrate the  trocar sites prior to the incision.  I used the previous 1 cm incision  in the right flank, dissection carried down to the anterior fascia.  This was incised for 1 cm and dissection was carried down bluntly to  the  peritoneum which was elevated and opened under direct vision.  There  were no adhesions of beneath this.  A Hassan trocar was placed through a  mattress suture of 0 Vicryl and pneumoperitoneum established.  Laparoscopy revealed omental adhesions to the intra-abdominal mesh which  was a Counselling psychologist dual mesh product with PTFE.  In order to carefully  inspect abdominal wall for hernias and take down the adhesions, a 5 mm  trocar was placed in the right lower quadrant.  All omental adhesions  were then carefully taken down off of the mesh with blount and cautery  dissection.  There were no bowel adhesions.  For the most part the  adhesions were filmy and could be swept off the PTFE although they were  more dense around the edges of the mesh.  As I took down the adhesions,  I observed  carefully for any tissue that was herniating through the  mesh and none was seen.  The inferior most  edge of the mesh, however had  rolled down for about a centimeter in length.  There was omental  adhesions to this edge but I could not palpate any hernia defect from  inside or without the abdomen.  This possibly been the area of previous  recurrence that had been fixed anteriorly, but I could not feel any  defect that went up to the anterior abdominal wall.  However to be  complete, I did use an Endo tacker and secured this inferior margin for  about a centimeter and a half back up to the anterior abdominal wall.  Again the entire area of mesh on the abdominal wall was carefully probed  from within and pushing from without, particularly in the area of  tenderness.  I did not see any evidence of hernia.  Following this, all  CO2 was evacuated.  I then opened the superior most portion of the  previous low midline incision adjacent to the palpable tender area just  to the left of this and dissection was carried down through the subcu  down onto the mesh.  The subcu was dissected up off the mesh and out to  its edge and at the area of tenderness and slight palpable thickening,  there was a 2-0 Prolene suture which had been used to secure the edge of  the anterior onlay mesh.  This was removed.  The mesh was intact in this  area as was the abdominal wall, well healed without any herniation.  There were no other abnormalities of the fascia, mesh was subcu.  Following this, this incision and the underlying abdominal wall was  extensively infiltrated with Marcaine.  Hemostasis assured.  The  mattress of Vicryl suture was secured at the [facility] trocar site.  The  skin incisions were closed with running subcuticular 4-0 Monocryl and  Dermabond.  Sponge, needle and instrument counts were correct.  The  patient taken to recovery in good condition.      Lorne Skeens. Hoxworth, M.D.  Electronically Signed    BTH/MEDQ  D:  12/05/2006  T:  12/05/2006  Job:  [id number]

## 2011-03-19 NOTE — Discharge Summary (Signed)
NAME:  Gina [name], Gina [name]               ACCOUNT NO.:  [id number]   MEDICAL RECORD NO.:  [id number]          PATIENT TYPE:  OBV   LOCATION:  5028                         FACILITY:  [facility]   PHYSICIAN:  Melissa L. Ladona Ridgel, MD  DATE OF BIRTH:  Jan 25, 1976   DATE OF ADMISSION:  06/06/2006  DATE OF DISCHARGE:  06/08/2006                                 DISCHARGE SUMMARY   CHIEF COMPLAINT ON ADMISSION:  Body rash, funny taste in her mouth.   Patient came to the emergency room after developing the acute onset of itchy  pruritic rash for the prior 24 hours.  Evidently, the patient had started  Lamictal 1-1/2 weeks prior to the onset of symptoms with increasing dose  five days prior to the event.  She was also out in the sun sun-bathing  without suntan lotion.  The patient was initially seen in the [facility]  urgent center and transferred to the emergency room because of concern for  possible Stevens-Johnson syndrome.  The patient was seen and evaluated.  She  was admitted to the hospital with a reaction to the probable Lamictal.  There was no evidence for Stevens-Johnson syndrome in the emergency room.  She was admitted to the hospital for 23-hour observation, started on an  H1/H2 blocker as well as IV steroids and treated with pain medication for  body aches.  On August 6th, the patient is noted to have refused her Paxil.  She requested possible evaluation by the psychiatrist for changes to her  medications.  A psychiatric consult was obtained.  Recommendations were to  hold her Paxil, trazodone, and holding her Lamictal, which may be the source  for her rash.  It was recommended that she start on some lithium 300 mg  b.i.d. and continue with Xanax p.r.n. agitation.  She is to follow up at the  [facility] as an outpatient and establish a primary care physician to  follow her lithium levels.   The patient responded very favorably to the IV steroids, Pepcid, and  Benadryl with decreased rash.   At the time of discharge, her only rash-like  area is across her abdomen, which appears to be still confluent but fading.  She has no rash on her back.   On the day of discharge, the patient states that she feels much improved.  She still has a little bit of a rash on the abdomen, as stated, but it is  not nearly as pruritic.  She still has a little itchy throat, but that is  not as serious as it was on admission.  She denies nausea, vomiting, or  shortness of breath.   PHYSICAL EXAMINATION:  VITAL SIGNS:  GENERAL: Well-developed and well-nourished white female in no acute  distress.  HEENT:  She is normocephalic and atraumatic.  Pupils are equal, round and  reactive to light.  Extraocular muscles are intact.  Mucous membranes are  moist.  NECK:  Supple.  There is no JVD, no lymph nodes, and no carotid bruits.  LUNGS:  Clear to auscultation.  There are no rales, rhonchi or wheezes.  CARDIOVASCULAR:  Regular rate and rhythm.  Positive S1 and S2.  No S3 or S4.  No murmurs, rubs or gallops.  ABDOMEN:  A macular rash with no areas of vesicular involvement or  denudation of the skin.  The area is confluent across the abdomen and does  not extend around her back.  If she did have rash on her back, it is very,  very faint.  She has no rash on her face or arms.  NEUROLOGIC:  She is awake, alert and oriented.  Her affect appears to be  appropriate.  Her power is 5/5.   Pertinent laboratory values during this admission reveal a wet prep that  shows no clue cells, no yeast or Trichomonas.  Her CBC on the 6th showed  WBCs of 5.6, eosinophils of 7, which is slightly high, hemoglobin 14,  hematocrit 41, and platelets 161.  Her benzodiazepine evaluation was  negative.  Urine microscopic showed no obvious WBCs or red cells.  Her UA  was negative for nitrates but showed trace leukocyte esterase.  Her TSH was  0.815.  Her liver function studies were within normal limits.   At this time, the patient  will need to follow up at [facility] and obtain  a lithium level on Monday, the 13th, at approximately 9:00 a.m.  She has  been instructed to take her lithium at 9 p.m. the night before in order to  provide an accurate level.  She is following at the [facility];  therefore, adjustments likely should be made in conjunction with the  [facility] psychiatry team.  The patient will establish the  relationship with [facility] prior to Monday.  I will recommend that she  follow up with the [facility] on Friday to assure that they are aware  of her new medications.   With regard to her probable allergic reaction to Lamictal, we will recommend  either an allergy followup or a dermatology followup when she is able to  obtain her insurance.  For now, we will avoid the inciting factor, and I  have asked her to please not purposefully sunbathe until she is stabilized  on her medications, in case her exposure to the sun caused her  symptomatology.   At this time, I have discussed the case with the patient and at her request,  her father, who is present.  They assured me that they will follow up with  obtaining her medication and obtaining appointments with the [facility].  We have reviewed the signs and symptoms of lithium toxicity and  those situations which should be avoided, including dehydration.   DISCHARGE DIAGNOSES:  1. Drug eruption secondary to Lamictal.  This has been discontinued, and      she will need to follow up when able with a dermatologist and/or      allergist.  She will be discharged to home on prednisone, Benadryl, and      Pepcid.  We will complete a prednisone taper.  2. Bipolar disease:  The patient is being started on lithium and assures      me that she will make her appointment at the [facility] on Friday      at the earliest and have her lithium levels drawn, as requested by Dr.     Jeanie Sewer on Monday.  She also should have a TSH level at  that time to      compare with her baseline level as well as a BMET,  which should be      evaluated as well.   MEDICATIONS AT DISCHARGE:  1. Lithium 300 mg twice daily, preferably taken at 9 a.m. and 9 p.m.  2. Xanax 0.25 mg 3 times daily as needed.  A limited supply will be given      until she follows up with the outpatient [facility], at which time      she could obtain renewals.  3. Pepcid 20 mg twice daily.  4. Benadryl 25 mg every 6 hours as needed for itching.  5. Prednisone 40 mg once daily for 2 days, then 30 mg once daily for 2      days, then 20 mg once daily for one day, then 10 mg once daily for one      dose, then stop.  The patient has been instructed to make an      appointment with either a dermatologist and/or an allergist once she is      able to afford that and/or has obtained insurance just to follow up on      her sensitivity reaction.   As stated, the patient has been instructed to make an appointment to see  [facility] for lab draws on Monday for her lithium levels as well as a  BMET and a TSH, and she is to see the [facility] on Friday, August 10.  She assures me that she will make that appointment.      Melissa L. Ladona Ridgel, MD  Electronically Signed     MLT/MEDQ  D:  06/08/2006  T:  06/08/2006  Job:  [id number]   cc:   [facility]  [facility]

## 2011-03-19 NOTE — Consult Note (Signed)
NAME:  Gina [name], Gina [name] NO.:  [id number]   MEDICAL RECORD NO.:  [id number]           PATIENT TYPE:   LOCATION:                                 FACILITY:   PHYSICIAN:  Antonietta Breach, M.D.       DATE OF BIRTH:   DATE OF CONSULTATION:  06/08/2006  DATE OF DISCHARGE:                                   CONSULTATION   FOLLOWUP CONSULTATION   Please see the chart note from yesterday.  [name] mood is euthymic  today.  She describes a normal mood with normal interests.  She has hope.  She has no thoughts of harming herself.  No thoughts of harming others.  No  delusions.  No hallucinations.  No racing thoughts.   The patient's rash has resolved.   Vital signs:  Temperature 98.5.  Pulse 53.  Respiration 18.  Blood pressure  97/60.  O2 saturation on room air 97%.   PAST PSYCHIATRIC HISTORY UPDATE:  The patient again confirms a history of  several week episodes involving euphoria, racing thoughts, decreased sleep  down to 3 hours, impaired judgment, engaging in high risk reckless behavior  that is not part of her character.  She also describes severe depressive  periods.  The manic episodes did not involve using a stimulant.   MENTAL STATUS EXAM:  The patient is alert, oriented to all spheres, thought  process, logical, coherent and goal directed.  Thought content:  No thoughts  of harming herself.  No thoughts of harming others.  No delusions.  No  hallucinations.  Memory intact to immediate, recent and remote.  Affect:  Broad and appropriate judgment intact.   The patient agrees to call emergency services for thoughts of harming  herself, thoughts of harming others, severe distress or racing thoughts.   ASSESSMENT:  AXIS I:  Bipolar I disorder manic, in partial remission  (clinical remission with manic episode within the past 6 months).   INFORMED CONSENT:  The indications, alternatives and adverse effects of  lithium were discussed with the patient,  including the risk of lethal  toxicity, as well as thyroid and kidney damage.  She understands the above  information and wants to proceed with lithium.  She will also proceed with  her Ambien 5 mg q.h.s. p.r.n. insomnia and trazodone standing 50 mg q.h.s.   RECOMMENDATIONS:  1. Continue with the initial dose of lithium at 300 mg b.i.d.  If      tolerated, this can be switch to 600 mg q.h.s. as the initial      conservative dose, her initial blood level of lithium.  2. The patient's initiation blood level of lithium should be at 5 to 6      half lives (24 hours) after the initial dose at 12 hours post.  That      will be on the a.m. of June 13, 2006, 12 hours after her evening      lithium dose.  The goal will be 0.8 to 1.2 and as mentioned above, this      initial dosage is conservative.  3. A basic metabolic panel should also be checked at that time.   The case was discussed with the case manager, Delora Fuel, and she will  schedule the patient for followup with the [facility] Psychiatrist within one week of discharge.      Antonietta Breach, M.D.  Electronically Signed     JW/MEDQ  D:  06/08/2006  T:  06/08/2006  Job:  [id number]

## 2011-03-19 NOTE — Op Note (Signed)
NAME:  Gina [name], Gina [name]                         ACCOUNT NO.:  [id number]   MEDICAL RECORD NO.:  [id number]                   PATIENT TYPE:  AMB   LOCATION:  DAY                                  FACILITY:  [facility]   PHYSICIAN:  Leonie Man, M.D.                DATE OF BIRTH:  [birth year]   DATE OF PROCEDURE:  06/18/2003  DATE OF DISCHARGE:                                 OPERATIVE REPORT   PREOPERATIVE DIAGNOSIS:  Abdominal pain undetermined etiology.   POSTOPERATIVE DIAGNOSES:  1. Endometriosis.  2. Ventral hernia.   PROCEDURE:  1. Diagnostic laparoscopy, laser vaporization of endometriosis.  2. Open repair of ventral hernia.   SURGEON:  Leonie Man, M.D. and Edwena Felty. Romine, M.D.   ANESTHESIA:  General.   INDICATIONS FOR PROCEDURE:  The patient is a 35 year old female with  intractable and persistent abdominal pain associated with nausea and emesis.  She has had a complete workup with multiple consultations involving CT scans  of the abdomen, barium enema, small bowel series and neurological  gastroenterology, gynecological and general surgical consultations without  any real etiology for her abdominal pain. She comes now to the operating  room for diagnostic laparoscopy and a test to see whether or not there is  any nonimaging areas of her abdomen which could be causing her pain.   DESCRIPTION OF PROCEDURE:  Following the induction of satisfactory general  anesthesia, Dr. Tresa Res who operated as a team with me, inserted a Veress  needle and insufflated the peritoneal cavity to 14 mmHg pressure. The trocar  was introduced, camera introduced and exploration of the abdomen. We noted  areas of endometriosis and these were vaporized by Dr. Tresa Res and this will  be dictated in a separate operative note. The remainder of the abdomen was  explored thoroughly, there were no abnormalities noted. We took pictures of  both the endometriosis and of her normal appendix. Exploration  of the upper  part of the abdomen showed a normal gallbladder, normal liver, normal  anterior gastric wall and normal small intestine. At that point, we decided  to go ahead and fix her ventral hernia which did in fact have some fat  adhered to the edge of the hernia sac. I then made an incision over the  hernia which is in the infraumbilical region through the skin and  subcutaneous tissue carrying the dissection down to the hernia. The hernia  sac was opened and the omental adhesions to the edge of the hernia sac were  taken down with electrocautery. The edge of the hernia was then grasped and  the hernia was repaired primarily with a running suture of 2-0 Prolene.  Sponge, instrument and sharp counts were all verified. The subcutaneous  tissues were closed with interrupted 3-0 Vicryl sutures and the skin was  closed with a running 4-0 Monocryl suture and then reinforced with  Steri-Strips. Laparoscopic wounds were also  closed with absorbable sutures.  All wounds were then reinforced with Steri-Strips, sterile dressings were  applied. The anesthetic was reversed, the patient removed from the operating  room to the recovery room in stable condition. She tolerated the procedure  well.                                               Leonie Man, M.D.    PB/MEDQ  D:  06/18/2003  T:  06/18/2003  Job:  [id number]

## 2011-03-19 NOTE — Discharge Summary (Signed)
[facility]  Patient:    Gina [name], Gina [name] Visit Number: [id number] MRN: [id number]          Service Type: EMS Location: [place] Attending Physician:  Devoria Albe Dictated by:   Dalbert Mayotte, M.D. Admit Date:  04/21/2002 Discharge Date: 04/22/2002                             Discharge Summary  ADDENDUM  DISPOSITION:                  Discharged home with newborn in stable condition. Dictated by:   Dalbert Mayotte, M.D. Attending Physician:  Devoria Albe DD:  04/27/02 TD:  04/30/02 Job: [id number]

## 2011-03-19 NOTE — Discharge Summary (Signed)
NAME:  Gina [name], Gina [name]                         ACCOUNT NO.:  [id number]   MEDICAL RECORD NO.:  [id number]                   PATIENT TYPE:  INP   LOCATION:  9308                                 FACILITY:  [facility]   PHYSICIAN:  Roseanna Rainbow, M.D.         DATE OF BIRTH:  August 31, 1976   DATE OF ADMISSION:  08/05/2003  DATE OF DISCHARGE:  08/08/2003                                 DISCHARGE SUMMARY   CHIEF COMPLAINT:  The patient is a 35 year old, gravida 3, para 2, last  menstrual period August 04, 2003, who complains of lower abdominal pain and  abnormal vaginal bleeding.   HISTORY OF PRESENT ILLNESS:  The patient had been seen earlier in triage  today at [facility] with similar complaints and was given Toradol,  morphine, and Vistaril. She returns with complaints of increasing lower  abdominal pain with vaginal bleeding. The patient has a history of  endometriosis.   MEDICATIONS:  Percocet.   ALLERGIES:  NICOTINE PATCH, CODEINE.   PAST OBSTETRICS HISTORY:  As above.  She is status post two C-section  deliveries. She has a history of one spontaneous abortion.   PAST MEDICAL HISTORY:  She denies.   PAST SURGICAL HISTORY:  See above. She is status post herniorrhaphy,  laparoscopy for endometriosis, and dilatation and curettage.   SOCIAL HISTORY:  Remarkable for tobacco use.   PHYSICAL EXAMINATION:  VITAL SIGNS: Temperature 99.2, pulse 77, respiratory  rate 20, blood pressure 116/72.  HEENT: Normocephalic and atraumatic.  LUNGS: Clear to auscultation bilaterally.  HEART: Regular rate and rhythm.  NECK: Supple.  BACK: No CVA tenderness.  ABDOMEN: Soft, normoactive bowel sounds throughout. Tender to palpation  bilaterally in the lower quadrant. No guarding or rebound.  PELVIC EXAM: The external female genitalia were normal appearing. On  speculum exam there was small heme noted. On bimanual exam there was minimal  cervical motion tenderness. The uterus was normal size,  nontender. The  adnexa revealed minimal left-sided tenderness.  RECTAL EXAM: Good tone. No masses palpable. Guaiac negative.  EXTREMITIES: No edema.  SKIN: Warm and dry.   LABORATORY DATA:  CBC, GC, Chlamydia, and RPR pending. Wet prep revealed  rare wbc's, moderate bacteria.   IMPRESSION:  1. History of endometriosis.  2. Chronic pelvic pain syndrome.  3. Abnormal uterine bleeding.   PLAN:  23-hour observation.   HOSPITAL COURSE:  The patient was admitted. She was initially placed on a  morphine PCA for pain control and this was changed to ibuprofen.  On  hospital day #2, Mirena IUD was placed.  Local anesthetic was given for a  paracervical block. After the procedure the patient complained of ringing in  her ears and circumoral numbness.  She was complaining also of shortness of  breath. At this point she was felt to be having a syncopal episode and  responded to an ammonia inhalant and her symptoms thereafter resolved.  She  was discharged to  home on hospital day #3.   DISCHARGE DIAGNOSIS:  Chronic pelvic pain syndrome.   PROCEDURE:  Mirena IUD placement.   CONDITION:  Stable.   DIET:  Regular.   ACTIVITY:  Ad lib.   MEDICATIONS:  Ibuprofen 600 mg p.o. q.6h. p.r.n.   DISPOSITION:  The patient is to follow up in the office in two weeks.                                               Roseanna Rainbow, M.D.    Judee Clara  D:  10/09/2003  T:  10/10/2003  Job:  [id number]

## 2011-03-19 NOTE — Op Note (Signed)
[facility]  Patient:    Gina [name], Gina [name] Visit Number: [id number] MRN: [id number]          Service Type: OBS Location: 910A 9135 01 Attending Physician:  Tammi Sou Dictated by:   Bing Neighbors Clearance Coots, M.D. Proc. Date: 09/20/01 Admit Date:  09/20/2001   CC:         Ed Blalock. Burnadette Peter, M.D.  Dalbert Mayotte, M.D.   Operative Report  PREOPERATIVE DIAGNOSES:       A [redacted] week gestation, previous cesarean section, desired repeat cesarean section.  POSTOPERATIVE DIAGNOSES:      A [redacted] week gestation, previous cesarean section, desired repeat cesarean section.  PROCEDURE:                    Repeat low transverse cesarean section.  SURGEON:                      Charles A. Clearance Coots, M.D.  ASSISTANT:                    Ed Blalock. Burnadette Peter, M.D.  ANESTHESIA:                   Spinal.  ESTIMATED BLOOD LOSS:         800 ml.  INTRAVENOUS FLUIDS:           3200 ml.  URINE OUT:                    150 ml clear.  COMPLICATIONS:                None.  DRAINS:                       Foley to gravity.  FINDINGS:                     Viable female at 65.  Apgars of 9 at one minute, 9 at five minutes.  Weight 7 pounds 11 ounces.  Normal uterus, ovaries, and fallopian tubes.  OPERATION:                    Patient was brought to the operating room and after satisfactory spinal anesthesia the abdomen was prepped and draped in the usual sterile fashion.  A Pfannenstiel skin incision was made with the scalpel through the previous scar down to the fascia.  The fascia was nicked in the midline and the fascial incision was extended to the left and to the right with curved Mayo scissors.  The superior and inferior fascial edges were taken off of the rectus muscles with both blunt and sharp dissection.  The rectus muscle was both bluntly and sharply divided in the midline and peritoneum was entered digitally and was digitally extended to the left and to the  right. Bladder blade was positioned and the vesicouterine fold, peritoneum above the reflection of the urinary bladder was grasped with the forceps and was incised and undermined with Metzenbaum scissors.  The incision was extended to the left and to the right with the Metzenbaum scissors.  The bladder flap was bluntly developed and the bladder blade was repositioned in front of the urinary bladder placing it well out of the operating field.  The uterus was entered in the lower uterine segment transversely with the scalpel.  Clear amniotic fluid was expelled.  The uterine incision was then extended  to the left and to the right in a smile configuration with the bandage scissors.  The vertex was then delivered with the aid of fundal pressure from the assistant. The infants mouth and nose were suctioned with the suction bulb and the delivery was completed with the aid of fundal pressure from the assistant. The umbilical cord was doubly clamped and cut and the infant was handed off to the nursing staff.  Cord blood was obtained and the placenta was spontaneously expelled from the uterine cavity intact.  The endometrial surface was then thoroughly debrided with a dry lap sponge and the edges of the uterine incision were grasped with ring forceps.  The uterus was closed with continuous interlocking suture of 0 Monocryl from each corner to the center. Hemostasis was excellent.  The pelvic cavity was thoroughly irrigated with warm saline solution and all clots were removed.  Closure of the uterus was again observed for hemostasis and there was no active bleeding noted.  The abdomen was then closed as follows.  The rectus muscle was approximated with interrupted sutures of 2-0 Monocryl.  The fascia was closed with a continuous suture of 0 PDS from each corner to the center.  Subcutaneous tissue was thoroughly irrigated with warm saline solution and all areas of subcutaneous bleeding were coagulated  with the Bovie.  The skin was then approximated with surgical stainless steel staples.  A sterile bandage was applied to the incision closure.  The surgical technician indicated that all needle, sponge, and instrument counts were correct.  The patient tolerated the procedure well and was transported to the recovery room in satisfactory condition. Dictated by:   Bing Neighbors Clearance Coots, M.D. Attending Physician:  Tammi Sou DD:  09/20/01 TD:  09/20/01 Job: [id number]

## 2011-03-19 NOTE — H&P (Signed)
NAME:  Gina [name], Gina [name]                         ACCOUNT NO.:  [id number]   MEDICAL RECORD NO.:  [id number]                   PATIENT TYPE:  INP   LOCATION:  9310                                 FACILITY:  [facility]   PHYSICIAN:  Freddy Finner, M.D.                DATE OF BIRTH:  20-May-1976   DATE OF ADMISSION:  02/06/2004  DATE OF DISCHARGE:                                HISTORY & PHYSICAL   ADMISSION DIAGNOSIS:  Severe dysmenorrhea.   HISTORY OF PRESENT ILLNESS:  The patient is a 35 year old, white, married  female, gravida 3, para 2, both by cesarean delivery, who was seen in the  office today on an emergency basis because of severe menstrual pain which  was basically incapacitating.  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  She was first seen in my office in November of 1993 and her  primary complaint at that time was concern over ventral hernias and concern  over ability to have further childbearing.  She was seen again in January of  2004, at which time she had a normal GYN checkup with Pap test.  The Pap  smear was normal.  On presentation in the office today, she was crying in  the fetal position and complaining bitterly of pelvic pain.   REVIEW OF SYSTEMS:  Currently she has no other significant review of  systems, except for episodes of diarrhea with her menses and marked urinary  frequency with menses.   PAST MEDICAL HISTORY:  She states that she is known to have irritable bowel  syndrome.  She has no other known significant medical illnesses.  She does  smoke cigarettes.  Previous surgical procedures include two cesarean  deliveries.  She had D&E for missed AB.  She also recently in 2004 had  laparoscopy which she states showed a diagnosis of endometriosis, but had no  other initial treatment.  She also had repair of her ventral hernia by  Leonie Man, M.D.  Further details of her  recent past history include  what sounds like an essentially complete GI workup with negative findings.  She has been treated intermittently with Depo-Provera.  She has had a couple  of injections of Lupron Depot.  She has had placement of an IUD for control  of menses and menstrual pain.  She has been managed on narcotic analyses  over the last six to eight months.  She most recently was referred to the  chronic pain clinic at [facility], where she was treated  with narcotics and later with methadone and Klonopin for abdominal pain.  She was sent to physical therapy for conditioning of her anterior abdominal  wall.   FAMILY HISTORY:  Noncontributory.   PHYSICAL EXAMINATION:  GENERAL APPEARANCE:  The patient is a well-nourished,  well-developed,  white female who appears in moderate distress, complaining  of severe menstrual cramps and lower abdominal pain with some radiation to  the left flank.  NECK:  The thyroid gland is not palpably enlarged.  LUNGS:  Auscultation revealed lung fields clear throughout.  HEART:  Normal sinus rhythm without murmurs, rubs, or gallops.  ABDOMEN:  Mild lower abdominal tenderness without appreciable organomegaly  or palpable masses.  PELVIC:  External genitalia, vagina, and cervix are normal.  The cervix does  have a small os and there is an IUD string coming through the os.  Bimanual  reveals the uterus to be anterior in position, perhaps 6 weeks size, but  somewhat symmetrical with moderate tenderness.  There are no palpable  adnexal masses.  The rectal and rectovaginal exam confirms this finding.  After the bimanual exam, it was elected to remove the IUD since it could be  contributing to her severe cramping pain and this was done in the office  without difficulty.  EXTREMITIES:  Without cyanosis, clubbing, or edema.   IMPRESSION:  Long history of chronic pelvic pain and particularly severe  dysmenorrhea.  Numerous attempts have been  made to help her with her  symptoms.  None to date with success.  She is now admitted for pain  management for further evaluation and possible treatment.                                               Freddy Finner, M.D.    WRN/MEDQ  D:  02/06/2004  T:  02/07/2004  Job:  [id number]

## 2011-03-19 NOTE — Op Note (Signed)
NAME:  Gina [name], Gina [name]                         ACCOUNT NO.:  [id number]   MEDICAL RECORD NO.:  [id number]                   PATIENT TYPE:  OBV   LOCATION:  9320                                 [facility]ACILITY:  [facility]   PHYSICIAN:  Freddy Finner, M.D.                DATE OF BIRTH:  Jul 26, 1976   DATE OF PROCEDURE:  07/07/2004  DATE OF DISCHARGE:                                 OPERATIVE REPORT   PREOPERATIVE DIAGNOSES:  1.  Slight uterine enlargement.  2.  Minimal evidence of previous pelvic endometriosis.  3.  Minimal evidence of interstitial cystitis on previous cystoscopy.  4.  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   POSTOPERATIVE DIAGNOSES:  1.  Slight uterine enlargement.  2.  Minimal evidence of previous pelvic endometriosis.  3.  Minimal evidence of interstitial cystitis on previous cystoscopy.  4.  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  5.  Minimal evidence of recurring endometriosis with 2 small lesions on the      right ovary and 1 in the cul-de-sac just medial to the right uterosacral      ligament; all of these were coagulated during the procedure.   OPERATIVE PROCEDURES:  1.  Laparoscopy.  2.  Fulguration of endometriosis.  3.  Laparoscopically assisted vaginal hysterectomy.   SURGEON:  Freddy Finner, M.D.   ASSISTANT:  Duke Salvia. Marcelle Overlie, M.D.   ANESTHESIA:  General.   ESTIMATED INTRAOPERATIVE BLOOD LOSS:  400 mL.   INTRAOPERATIVE COMPLICATIONS:  Small labial burn on the left side from the  Bovie coagulation device used during the vaginal portion of the procedure.   FURTHER COMPLICATIONS:  None.   DESCRIPTION OF PROCEDURE:  The patient was admitted on the morning of  surgery.  She was given a bolus of Rocephin IV.  She was placed in PSA hose.  She was brought to the operating room and there placed under adequate  general endotracheal anesthesia.  She was placed in the dorsal lithotomy  position using the [place] stirrups system.   Betadine prep was carried out  with Betadine scrub followed by Betadine solution.  Bladder was evacuated  with a Robinson catheter.  Cervix was visualized using a bivalved speculum.  Hulka tenaculum was attached to the cervix.  Sterile drapes were then  applied.  Two small incisions were made in the abdomen, one at the umbilicus  and one just above the symphysis through the old lower abdominal transverse  C-section scar.  An 11-mm disposable trocar which was non-bladed was  inserted at the umbilical incision while elevating the anterior abdominal  wall manually.  Direct inspection revealed no evidence of injury on entry.  The omental adhesions previously noted and lysed from previous laparoscopic  surgery were not present below the umbilicus.  Scanning inspection of the  abdomen revealed no other significant abnormalities, except for the pelvic  findings as noted above.  The appendix was visualized and was normal.  A 5-  mm trocar was pushed through the lower incision and through it a blunt probe  placed to use for retraction during the procedure.  Photographs were made of  all pelvic findings and of the appendix.  Photos were again made  postoperatively after removal of the uterus.  Using the Gyrus tripolar  device, the utero-ovarian pedicles and upper broad ligament pedicles were  developed including the round ligament on each side to a level at  approximately the level of the uterine arteries.  Attention was then turned  vaginally.  A posterior colpotomy incision was made while tenting the mucosa  posterior to the cervix.  A Jacobs tenaculum was placed on the cervix after  removal of the Hulka tenaculum.  The cervix was circumscribed with a  scalpel.  The Gyrus Heaney-type bipolar clamp was used to seal and divide  uterosacrals and bladder pillars.  Great care was exercised in dissecting  the bladder off the cervix.  The cardinal ligament pedicles were taken  serially and divided with  the Gyrus device.  This was also true of the  vessels.  To identify the anterior fold of peritoneum, the uterus was  delivered posteriorly and digital exploration was carried out to identify  the reflection of the peritoneum, which was advanced high above the lower  uterine segment of the uterus; this was sharply entered; remaining pedicles  were taken, sealed and divided.  A bleeding source on the right was further  controlled with a suture ligature of 0 Monocryl after grasping it with a  Heaney clamp.  Angles of the vagina were then anchored to uterosacrals with  a figure-of-eight suture of 0 Monocryl.  Uterosacrals were closed and  peritoneum closed with an interrupted 0 Monocryl suture.  Cuff was closed  vertically with figure-of-eights of 0 Monocryl.  Foley catheter was placed.  Re-inspection with a laparoscopic was carried out and numerous small sources  of bleeding and one more moderate source were eventually identified and  completely controlled using the bipolar coagulation.  Irrigation was used  during this portion of the procedure with the [place] irrigation system.  Irrigating solution was aspirated from the abdomen.  Inspection under  reduced intra-abdominal pressure revealed adequate hemostasis.  Gas was  allowed to escape.  Instruments were removed.  Skin incisions were closed  with interrupted subcuticular sutures of 3-0 Dexon.  Marcaine 0.25% was  injected into each incision site for postoperative analgesia.  The burn on  the labia was also injected with 0.25% Marcaine.  Steri-Strips were applied  to the lower incision.  The procedure was then terminated.  The patient was  awakened and taken to recovery in good condition.                                               Freddy Finner, M.D.    WRN/MEDQ  D:  07/07/2004  T:  07/08/2004  Job:  [id number]

## 2011-03-19 NOTE — H&P (Signed)
NAME:  Gina [name], Gina [name]                         ACCOUNT NO.:  [id number]   MEDICAL RECORD NO.:  [id number]                   PATIENT TYPE:  OBV   LOCATION:  NA                                   [facility]ACILITY:  [facility]   PHYSICIAN:  Freddy Finner, M.D.                DATE OF BIRTH:  09/12/1976   DATE OF ADMISSION:  07/07/2004  DATE OF DISCHARGE:                                HISTORY & PHYSICAL   ADMISSION DIAGNOSES:  1.  Mild uterine enlargement consistent with adenomyosis.  2.  Previous history of pelvic endometriosis.  3.  Previous history of minimal and possible interstitial cystitis.  4.  Previous history of omental adhesions from herniorrhaphy of the anterior      abdominal wall.  5.  Chronic pelvic pain.  6.  Severe dysmenorrhea.  7.  Dysfunctional uterine bleeding.   The patient is a 35 year old white, married female, gravida 3, para 2, both  delivered by cesarean section, who has a long chronic history of pelvic and  lower abdominal pain.  This is her third [facility] admission just  this year for the same complaint. She was also previously admitted in 2004  for pain.  She has had extensive consultation and evaluation and surgeries  including herniorrhaphy repair, laparoscopy, cystoscopy.  She has had  neurological consultation from Dr. Sharene Skeans.  The patient is articulate,  seems to fully understand and comprehend the possibilities of her pathology  as noted above and the possibility of surgery not resolving all of her  symptoms.  She was previously scheduled for hysterectomy in July 2005 and  cancelled that surgery with the thought of having additional childbearing.   She and her husband were in may office approximately 10 days prior to  surgery at which time a careful consultation with the patient and her  husband was carried out including preclusion of any further childbearing if  she has hysterectomy.  They have stated that they are willing to try  conception only up  until the time of surgery and will cancel the surgery in  the event of that pregnancy.  Per patient's statement, if she does not  conceive, it is God's will that she proceed with this surgery which she  believes will significantly reduce her pain and improve the quality of her  lifestyle dramatically.  It has been emphatically stated to the patient that  she will definitely not have periods and will possibly not have pain,  although nothing can be guaranteed.  She and her husband both seemed  prepared to proceed under these circumstances.  She has reviewed a video in  the office which carefully describes the anticipated operative procedure  which is laparoscopic-assisted vaginal hysterectomy including the potential  risks of the procedure.   REVIEW OF SYSTEMS:  Her current Review of Systems is otherwise negative.  She has no cardiopulmonary, GI, or other GU complaints.  PAST MEDICAL HISTORY:  The patient has no known significant medical  illnesses.  She does state that she has been diagnosed with irritable bowel  syndrome.  She has had numerous surgical procedures as noted above.   ALLERGIES:  No known allergies to medications.   CURRENT MEDICATIONS:  1.  Percocet 5/325 for pain.  2.  Halcion 0.25 mg to be used at h. s. p.r.n. sleep.  3.  Xanax 0.25 mg which she takes p.r.n. for anxiety.   The patient has never had a blood transfusion.   FAMILY HISTORY:  Noncontributory.   PHYSICAL EXAMINATION:  HEENT:  Grossly within normal limits.  NECK:  Thyroid gland is not palpably enlarged.  VITAL SIGNS:  Blood pressure in the office is 90/60.  CHEST:  Clear to auscultation throughout.  HEART:  Normal sinus rhythm without murmurs, rubs, or gallops.  ABDOMEN:  Soft.  There is minimal tenderness in the lower abdomen.  No  palpable organomegaly or palpable masses.  BREASTS: Exam is considered to be normal.  No masses, no skin change, no  nipple discharge.  PELVIC:  External genitalia,  vagina, and cervix are normal to inspection.  Bimanual reveals uterus to be tender, anteflexed, and approximately [redacted] weeks  gestational size.  There are no palpable adnexal masses.  RECTAL:  Palpably normal.  Rectovaginal exam confirms the above findings.   IMPRESSION:  Chronic pelvic pain.  Numerous possible contributing etiologies  include adenomyosis, severe dysmenorrhea, dysfunctional uterine bleeding,  also probably consistent adenomyosis.   PLAN:  Laparoscopic-assisted vaginal hysterectomy.                                               Freddy Finner, M.D.    WRN/MEDQ  D:  07/06/2004  T:  07/06/2004  Job:  [id number]

## 2011-03-19 NOTE — Op Note (Signed)
NAME:  Gina [name], Gina [name]                         ACCOUNT NO.:  [id number]   MEDICAL RECORD NO.:  [id number]                   PATIENT TYPE:  INP   LOCATION:  9310                                 FACILITY:  [facility]   PHYSICIAN:  Freddy Finner, M.D.                DATE OF BIRTH:  29-Jul-1976   DATE OF PROCEDURE:  02/14/2004  DATE OF DISCHARGE:                                 OPERATIVE REPORT   PREOPERATIVE DIAGNOSES:  1. Chronic pelvic pain.  2. History of pelvic endometriosis.  3. History of previous laparoscopy.  4. History of ventral hernia repair.   POSTOPERATIVE DIAGNOSES:  1. Omental adhesions to anterior abdominal wall consistent with previous     hernia repair.  2. Boggy mottled enlargement of uterus.  3. Minimal scattered lesions consistent with endometriosis and/or tattooing     from previous laser procedure.  4. No apparent upper abdominal abnormalities.  The appendix was not     visualized.   OPERATIVE PROCEDURES:  1. Diagnostic laparoscopy.  2. Bipolar fulguration of lesion of right ovary and lesions in cul-de-sac     possibly consistent with endometriosis.  3. Uterosacral nerve ablation with bipolar fulguration.  4. Lysis of omental adhesions and resection of adhesions by bipolar     coagulation and sharp division.   INTRAOPERATIVE COMPLICATIONS:  None.   ESTIMATED INTRAOPERATIVE BLOOD LOSS:  10 mL during my portion of the  procedure.  A second procedure was performed by Valetta Fuller, M.D., who  will dictate a separate note.  His procedure was cystoscopy   INDICATIONS:  The patient has an extensive history of pelvic pain with  repeated evaluations and hospitalizations for pain management.  Her most  recent admission was on February 10, 2004, and she has been hospitalized since  that time for pain management.   DESCRIPTION OF PROCEDURE:  A combined procedure was arranged and she was  brought to the operating room today where she was placed under adequate  general  endotracheal anesthesia and placed in the dorsolithotomy position  using the Holbrook stirrup system.  Betadine prep was carried out using  Betadine scrub followed by Betadine solution.  This included the lower  abdomen, the perineum, the vagina, and the upper thighs.  The bladder was  evacuated with a Robinson catheter.  The cervix was visualized and the Hulka  tenaculum applied.  Sterile drapes were applied.  Two small incisions were  made, one at the umbilicus through an old scar and one just above the  symphysis through an old C-section scar.  An 11 mm, nonbladed, disposable  trocar was introduced through the umbilical incision.  Direct inspection  revealed adequate placement.  There was no evidence of injury on entry.  Pneumoperitoneum was allowed with accumulate with carbon dioxide gas.  Inspection revealed extensive adhesions of omentum at and inferior to the  umbilicus.  Other findings were described above and  were elicited by careful  examination of pelvic and abdominal contents.  A second trocar was placed  through the lower incision and through it a blunt probe was used for  retraction during the procedure.  Using the bipolar Kleppinger forceps, the  omental adhesions at the level of the umbilicus and along the anterior  abdominal wall were fulgurated and sharply lysed.  Any evidence of visible  disease consistent with endometriosis was then fulgurated with the bipolar  forceps.  The uterosacral ligaments on each side were grasped with the  bipolar forceps and fulgurated.  The [place] irrigating system was then used.  Irrigation of pelvic and abdominal contents was carried out.  Careful  inspection revealed adequate hemostasis of the omentum.  There was no active  bleeding anywhere in the abdomen or pelvis.  At this point, the gas was  allowed to escape from the abdomen.  Instruments were removed.  Skin  incisions were anesthetized with 0.25% Marcaine.  Incisions were closed with   interrupted subcuticular sutures of 3-0 Dexon.  Steri-Strips were applied to  the lower incision.  The procedure was turned over to Dr. Isabel Caprice at this  point, who will dictate that portion of the procedure.  The patient  tolerated the operative procedure well.                                               Freddy Finner, M.D.    WRN/MEDQ  D:  02/14/2004  T:  02/14/2004  Job:  [id number]

## 2011-03-19 NOTE — Discharge Summary (Signed)
NAME:  Gina [name], Gina [name] NO.:  [id number]   MEDICAL RECORD NO.:  [id number]          PATIENT TYPE:  IPS   LOCATION:  0501                          FACILITY:  [facility]   PHYSICIAN:  Jasmine Pang, M.D. DATE OF BIRTH:  1975/11/08   DATE OF ADMISSION:  05/05/2006  DATE OF DISCHARGE:  05/07/2006                                 DISCHARGE SUMMARY   IDENTIFICATION:  The patient was a 35 year old Caucasian female who was  admitted on a voluntary basis to my service on May 25, 2006.   HISTORY OF PRESENT ILLNESS:  The patient has been depressed with vague  symptoms of suicidal ideation secondary to marital discord.  She has been  taking Xanax 1 mg at h.s. but has been noncompliant with multiple treatments  of SSRIs.  She is now not on any medications.  They had been prescribed by  her PCP.  The patient is diagnosed with borderline personality disorder.  She endorsed passive suicidal ideation but denied that today.  She is  separated from her husband.   PAST PSYCHIATRIC HISTORY:  This is the first psychiatric admission.  The  patient has been treated with numbers of SSRIs in the past with positive  response but she did not remain compliant.   SUBSTANCE ABUSE HISTORY:  The patient denies.   PAST MEDICAL HISTORY:  Medical problems none.   MEDICATIONS:  The patient must chew all of her medications.  She takes  Percocet for endometriosis.  She is noncompliant with her psychiatric or any  other medications.   ALLERGIES:  No known drug allergies.   PHYSICAL EXAMINATION:  The patient's physical examination was normal without  acute physical distress.  There were no physical problems noted.   LABORATORY DATA:  CBC was remarkable for WBC of 6.4, hemoglobin of 14.8,  hematocrit of 43.8, platelets 230.  The basic chem profile with sodium 138,  potassium 3.8, chloride 101, CO2 29, BUN 6, creatinine 0.7, chloride 95.  Hepatic profile with SGOT 14, SGPT 11, alkaline phosphatase 83,  total  bilirubin 0.8.  TSH was 0.815 which was within normal limits.  Urine drug  screen was positive for benzodiazepines.  Urinalysis was negative.   HOSPITAL COURSE:  Upon admission, the patient was begun on Ambien 5 mg p.o.  p.r.n. insomnia.  She was also started on Percocet 5/325 mg, 1 p.o. q.6h.  p.r.n. pain.  On May 06, 2006, Zoloft 25 mg p.o. q.d.  The patient tolerated  these medications well with no significant side effects.   On admission, upon first meeting the patient, she stated I'm just  depressed.  She stated she had no insurance and could not get treatment in  a timely manner.  She wanted medication for her depression.  She is going  through a divorce which had been a stressor.  She states her impulsivity did  not help the marriage.  She has had five GYN surgeries.  She has two  children, ages 31 and 37.  Zoloft 25 mg p.o. q.d. was started.  On May 07, 2006, the patient reported feeling  much better.  She wanted to go home.  She  reiterated that she was never suicidal at any time.  Her mental status had  improved.  Her mood was less depressed.  Affect was wide range.  There was  no suicidal or homicidal ideation.  No auditory or visual hallucinations.  No paranoia or delusions.  Thought processes were logical and goal directed.  Thought content with no predominant theme.  The cognitive exam was grossly  within normal limits.   DISCHARGE DIAGNOSES:  AXIS I:  Major depression, recurrent, severe.  AXIS II:  None.  AXIS III:  No diagnosis.  AXIS IV:  Moderate to severe (marital discord).  AXIS V:  GAF upon discharge 50; GAF upon admission 46; GAF highest past year  28.   ACTIVITY/DIET:  There were no specific activity level or dietary  restrictions.   DISCHARGE MEDICATIONS:  Zoloft 50 mg p.o. q.d.   POST-HOSPITAL CARE PLANS:  The patient will return to counseling with  Marcelino Duster at [facility].  Medication management will be at [facility].      Jasmine Pang, M.D.  Electronically Signed     BHS/MEDQ  D:  06/13/2006  T:  06/14/2006  Job:  [id number]

## 2011-03-19 NOTE — H&P (Signed)
NAME:  BLAKLEY, MICHNA NO.:  [id number]   MEDICAL RECORD NO.:  [id number]          PATIENT TYPE:  OBV   LOCATION:  1847                         FACILITY:  [facility]   PHYSICIAN:  Jackie Plum, M.D.DATE OF BIRTH:  12-Jun-1976   DATE OF ADMISSION:  06/06/2006  DATE OF DISCHARGE:                                HISTORY & PHYSICAL   CHIEF COMPLAINT:  Body rash, funny taste in the mouth .   HISTORY OF PRESENT ILLNESS:  The patient is a 35 year old Caucasian lady  with history of bipolar disorder and status post hernia repair.  She  presents with 24-hour history of above complaint.  According to the patient  she had been well until yesterday when she started to experience a pruritic  rash all over her body.  According to her she had been started on Lamictal  about 1-1/2 weeks ago and was ultimately increased in dose about 5 days ago.  She had gone to the pool 4 days ago and had done fairly well without any  problems; however, she started to experience some redness on the skin with  itchiness and therefore went to [facility] whereupon she was  referred here for further evaluation because of concern for possible Andria Meuse-  Johnson syndrome.  She denies any fever or chills, shortness of breath,  chest pain, palpitations, nausea, vomiting, diarrhea.  No visual changes.   PAST MEDICAL HISTORY:  As noted above.   MEDICINES:  1. Paxil 25 mg daily.  2. Lamictal 15 mg daily.  3. Trazodone 50 mg q.h.s.   ALLERGIES:  SHE IS ALLERGIC TO ZOFRAN.  SHE IS ALSO INTOLERANT TO MANY  MEDICATIONS.   FAMILY HISTORY:  Negative for any heart disease or strokes.  No history of  anaphylaxis in the family as far as she is aware.   SOCIAL HISTORY:  The patient smokes cigarettes and is currently taking  nicotine patch in this regard.  She does not drink alcohol.  She is married.  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   REVIEW OF  SYSTEMS:  As noted above.   PHYSICAL EXAMINATION:  VITALS:  Unremarkable.  BP 92/80 , pulse 59,  respirations 16, temperature 98.1 and 98% on room air.  GENERAL:  The patient was not in acute cardiopulmonary distress.  HEENT:  Normocephalic, atraumatic.  Pupils were equal, round, reactive to  light.  Oropharynx moist.  NECK:  Supple.  No JVD.  CARDIOPULMONARY:  Auscultation unremarkable.  ABDOMEN:  Soft, nontender, bowel sounds present.  EXTREMITIES:  No cyanosis.  SKIN:  The patient has coalescent papular erythematous over the lower part  of her upper abdominal area with patches involving her back and her  buttocks.  These lesions were blanchable.  NEUROLOGICAL:  She is alert and oriented x3.   LABORATORIES:  WBC count 5.6, hemoglobin 14.0, hematocrit 41.1, platelet  count 151.  She had no BMET's done.   IMPRESSION:  Probable allergic reaction to Lamictal.  The patient does not  seem to have any evidence of Stevens-Johnson  syndrome.  Her mucous membranes  do not reveal any skin changes of concern.  She denies any soreness in her  vagina as far as she is aware.  No vaginal exam was done.  The patient being  admitted for 23-hour observation with H1/H2-blockers as well as steroids.  We will get dermatologic evaluation and if all is well we will discharge  tomorrow possibly to see a dermatologist.  She may need an allergist for  allergy testing at some point.      Jackie Plum, M.D.  Electronically Signed     GO/MEDQ  D:  06/06/2006  T:  06/06/2006  Job:  [id number]

## 2011-03-19 NOTE — Op Note (Signed)
[facility]  Patient:    Gina [name], Gina [name]                        MRN: [id number] Proc. Date: 09/26/00 Adm. Date:  [id number] Attending:  Venita Sheffield                           Operative Report  PREOPERATIVE DIAGNOSIS:       Intrauterine fetal demise.  PROCEDURE:                    Using MAC, patient in the lithotomy position, perineum and vagina prepped and draped, bladder emptied with straight catheter.  Bimanual examination revealed the uterus to be 8 weeks size.  There was a weighted speculum placed in the vagina.  Old blood noted in vagina. Cervix was open and easily admitted a #27 Pratt.  A #9 suction used to aspirate uterine contents.  A large amount of tissue obtained and the cavity suctioned until clean.  Patient tolerated the procedure well.  Taken to recovery room in good condition. DD:  09/26/00 TD:  09/26/00 Job: [id number]

## 2011-03-19 NOTE — Discharge Summary (Signed)
NAME:  Gina [name], FORNEY                         ACCOUNT NO.:  [id number]   MEDICAL RECORD NO.:  [id number]                   PATIENT TYPE:  OBV   LOCATION:  9311                                 FACILITY:  [facility]   PHYSICIAN:  Freddy Finner, M.D.                DATE OF BIRTH:  15-Jan-1976   DATE OF ADMISSION:  04/29/2004  DATE OF DISCHARGE:  04/30/2004                                 DISCHARGE SUMMARY   DISCHARGE DIAGNOSIS:  1. Chronic pelvic pain.  2. Known pelvic endometriosis.  3. Possible early interstitial cystitis.   OPERATIVE PROCEDURES:  None.   DISPOSITION:  The patient is in improved condition at the time of her  discharge.   DISCHARGE MEDICATIONS:  She is to resume her regular medications at home  including -  1. Percocet and/or Toradol.  2. Ortho Evra skin patch.   FOLLOW UP:  She is to follow up as previously scheduled at her preoperative  visit for planned surgery on May 19, 2004.   DIET:  She is to take a regular diet.   HISTORY:  The patient is a 35 year old with a history of chronic pelvic pain  with numerous hospital admissions including at least 3 admissions in the  very recent past to this hospital.  She has episodes of severe  incapacitating pain, seemingly unresponsive to oral pain medications.  She  presented on the evening prior to this admission with those symptoms.  Her  physical findings, as recorded by Dr. Richarda Overlie on admission, showed  some tenderness in the left lower quadrant of the abdomen without rebound.  There were positive bowel sounds.  There was no obvious gross abnormality on  examination.  Her hemoglobin was 12.8, white count 7200, hematocrit 37.1.  Urinalysis was negative.   HOSPITAL COURSE:  The patient was admitted, started on PCA morphine pump,  and over the next 12-18 hours had significant improvement in her symptoms,  and by that point in time was ready for discharge.  She was discharged home  with her other medications, and  regular diet.   ACTIVITY:  Progressively increasing physical activity.                                               Freddy Finner, M.D.    WRN/MEDQ  D:  04/30/2004  T:  04/30/2004  Job:  [id number]

## 2011-03-31 ENCOUNTER — Other Ambulatory Visit: Payer: Self-pay | Admitting: Gastroenterology

## 2011-03-31 ENCOUNTER — Ambulatory Visit (HOSPITAL_COMMUNITY)
Admission: RE | Admit: 2011-03-31 | Discharge: 2011-03-31 | Disposition: A | Discharge status: Home / Self Care | Payer: 59 | Source: Ambulatory Visit | Attending: Gastroenterology | Admitting: Gastroenterology

## 2011-03-31 DIAGNOSIS — R1310 Dysphagia, unspecified: Secondary | ICD-10-CM | POA: Insufficient documentation

## 2011-03-31 DIAGNOSIS — F17200 Nicotine dependence, unspecified, uncomplicated: Secondary | ICD-10-CM | POA: Insufficient documentation

## 2011-03-31 DIAGNOSIS — M1990 Unspecified osteoarthritis, unspecified site: Secondary | ICD-10-CM | POA: Insufficient documentation

## 2011-03-31 DIAGNOSIS — M069 Rheumatoid arthritis, unspecified: Secondary | ICD-10-CM | POA: Insufficient documentation

## 2011-04-06 ENCOUNTER — Telehealth: Payer: Self-pay | Admitting: *Deleted

## 2011-04-06 ENCOUNTER — Other Ambulatory Visit: Payer: Self-pay | Admitting: Internal Medicine

## 2011-04-06 DIAGNOSIS — M5412 Radiculopathy, cervical region: Secondary | ICD-10-CM

## 2011-04-06 MED ORDER — OXYCODONE-ACETAMINOPHEN 10-325 MG PO TABS: 1.0000 | ORAL_TABLET | ORAL | Status: DC | PRN

## 2011-04-06 NOTE — Telephone Encounter (Signed)
done

## 2011-04-06 NOTE — Telephone Encounter (Signed)
Pt completely out of oxyCODONE-acetaminophen (PERCOCET) 10-325 MG per tablet.

## 2011-04-29 ENCOUNTER — Telehealth: Payer: Self-pay | Admitting: Internal Medicine

## 2011-04-29 DIAGNOSIS — M5412 Radiculopathy, cervical region: Secondary | ICD-10-CM

## 2011-04-29 MED ORDER — OXYCODONE-ACETAMINOPHEN 10-325 MG PO TABS: 1.0000 | ORAL_TABLET | ORAL | Status: DC | PRN

## 2011-04-29 NOTE — Telephone Encounter (Signed)
done

## 2011-04-29 NOTE — Telephone Encounter (Signed)
Refill Oxycodone 10-325mg .

## 2011-05-06 ENCOUNTER — Encounter: Payer: Self-pay | Admitting: Internal Medicine

## 2011-05-06 ENCOUNTER — Ambulatory Visit (INDEPENDENT_AMBULATORY_CARE_PROVIDER_SITE_OTHER): Payer: 59 | Admitting: Internal Medicine

## 2011-05-06 DIAGNOSIS — K222 Esophageal obstruction: Secondary | ICD-10-CM

## 2011-05-06 DIAGNOSIS — R51 Headache: Secondary | ICD-10-CM

## 2011-05-06 DIAGNOSIS — F411 Generalized anxiety disorder: Secondary | ICD-10-CM

## 2011-05-06 DIAGNOSIS — F17200 Nicotine dependence, unspecified, uncomplicated: Secondary | ICD-10-CM

## 2011-05-06 MED ORDER — PENICILLIN V POTASSIUM 125 MG/5ML PO SOLR: ORAL | Status: DC

## 2011-05-06 NOTE — Progress Notes (Signed)
  Subjective:    Patient ID: Gina [name], female    DOB: 12/15/1975, 35 y.o.   MRN: [id number]  HPI  35 year old patient who is seen today with a chief complaint of right facial discomfort sore throat and ear discomfort. Approximately 3 months ago she had oral surgery performed. 3 weeks ago she had an esophageal dilatation with biopsies. For the past week and a half she has had right jaw discomfort sore throat ear pain. There's been no real fever. She's also noticed some swelling about the gum line on the right. Both upper and lower. She has seen her dentist who felt that there was some significant soft tissue swelling but he stated there was no abscess and did not prescribe antibiotic therapy.  She is involved in a legal dispute with her oral surgeon and has not returned to that practice. She is very anxious and raises the possibility of cancer. She continues to smoke.    Review of Systems  Constitutional: Negative.   HENT: Positive for ear pain. Negative for hearing loss, congestion, sore throat, rhinorrhea, dental problem, sinus pressure and tinnitus.   Eyes: Positive for pain. Negative for discharge and visual disturbance.  Respiratory: Negative for cough and shortness of breath.   Cardiovascular: Negative for chest pain, palpitations and leg swelling.  Gastrointestinal: Negative for nausea, vomiting, abdominal pain, diarrhea, constipation, blood in stool and abdominal distention.  Genitourinary: Negative for dysuria, urgency, frequency, hematuria, flank pain, vaginal bleeding, vaginal discharge, difficulty urinating, vaginal pain and pelvic pain.  Musculoskeletal: Negative for joint swelling, arthralgias and gait problem.  Skin: Negative for rash.  Neurological: Negative for dizziness, syncope, speech difficulty, weakness, numbness and headaches.  Hematological: Negative for adenopathy.  Psychiatric/Behavioral: Negative for behavioral problems, dysphoric mood and agitation. The patient is  not nervous/anxious.        Objective:   Physical Exam  Constitutional: She is oriented to person, place, and time. She appears well-developed and well-nourished.       Anxious. No acute distress  HENT:  Head: Normocephalic.  Right Ear: External ear normal.  Left Ear: External ear normal.  Mouth/Throat: Oropharynx is clear and moist.       The gingival tissue appeared slightly swollen on the right both upper and lower; the alveolar bone appeared to be more prominent on the right  Membranes both normal  Eyes: Conjunctivae and EOM are normal. Pupils are equal, round, and reactive to light.  Neck: Normal range of motion. Neck supple. No thyromegaly present.       No adenopathy  Cardiovascular: Normal rate, regular rhythm, normal heart sounds and intact distal pulses.   Pulmonary/Chest: Effort normal and breath sounds normal. No respiratory distress. She has no wheezes. She has no rales.  Abdominal: Soft. Bowel sounds are normal. She exhibits no mass. There is no tenderness.  Musculoskeletal: Normal range of motion.  Lymphadenopathy:    She has no cervical adenopathy.  Neurological: She is alert and oriented to person, place, and time.  Skin: Skin is warm and dry. No rash noted.  Psychiatric: She has a normal mood and affect. Her behavior is normal.          Assessment & Plan:   Right hemifacial pain. There appears to be soft tissue swelling about the alveolar bone on the right. Have recommended that she follow up with her dentist who may refer her to another oral surgeon. We'll treat with pen VK. She'll continue her antibiotics

## 2011-05-06 NOTE — Patient Instructions (Signed)
Take your antibiotic as prescribed until ALL of it is gone, but stop if you develop a rash, swelling, or any side effects of the medication.  Contact our office as soon as possible if  there are side effects of the medication.  Smoking tobacco is very bad for your health. You should stop smoking immediately.  Followup with general dentistry and oral surgery

## 2011-05-14 ENCOUNTER — Telehealth: Payer: Self-pay | Admitting: *Deleted

## 2011-05-14 MED ORDER — CEPHALEXIN 250 MG/5ML PO SUSR: 250.0000 mg | Freq: Four times a day (QID) | ORAL | Status: AC

## 2011-05-14 NOTE — Telephone Encounter (Signed)
Pt seen Dr Kirtland Bouchard for mouth issues.  He gave her PCN and her mouth feels better but now she has a itchy rash on her face, back, and neck.  She is taking Benedryl but wants what to do next.  Told pt to stop taking PCN until she heard from nurse

## 2011-05-14 NOTE — Telephone Encounter (Signed)
Per dr Lovell Sheehan- stop pcn and may start keflex 250 qid for 7 days

## 2011-05-20 ENCOUNTER — Telehealth: Payer: Self-pay

## 2011-05-20 ENCOUNTER — Inpatient Hospital Stay (HOSPITAL_COMMUNITY)
Admission: AD | Admit: 2011-05-20 | Discharge: 2011-05-21 | Disposition: A | Discharge status: Home / Self Care | Payer: 59 | Source: Ambulatory Visit | Attending: Obstetrics and Gynecology | Admitting: Obstetrics and Gynecology

## 2011-05-20 DIAGNOSIS — N949 Unspecified condition associated with female genital organs and menstrual cycle: Secondary | ICD-10-CM | POA: Insufficient documentation

## 2011-05-20 DIAGNOSIS — F411 Generalized anxiety disorder: Secondary | ICD-10-CM | POA: Insufficient documentation

## 2011-05-20 DIAGNOSIS — R142 Eructation: Secondary | ICD-10-CM | POA: Insufficient documentation

## 2011-05-20 DIAGNOSIS — F17200 Nicotine dependence, unspecified, uncomplicated: Secondary | ICD-10-CM | POA: Insufficient documentation

## 2011-05-20 DIAGNOSIS — K5900 Constipation, unspecified: Secondary | ICD-10-CM | POA: Insufficient documentation

## 2011-05-20 DIAGNOSIS — Z79899 Other long term (current) drug therapy: Secondary | ICD-10-CM | POA: Insufficient documentation

## 2011-05-20 DIAGNOSIS — R102 Pelvic and perineal pain: Secondary | ICD-10-CM

## 2011-05-20 DIAGNOSIS — R141 Gas pain: Secondary | ICD-10-CM | POA: Insufficient documentation

## 2011-05-20 DIAGNOSIS — F329 Major depressive disorder, single episode, unspecified: Secondary | ICD-10-CM | POA: Insufficient documentation

## 2011-05-20 DIAGNOSIS — F3289 Other specified depressive episodes: Secondary | ICD-10-CM | POA: Insufficient documentation

## 2011-05-20 DIAGNOSIS — K219 Gastro-esophageal reflux disease without esophagitis: Secondary | ICD-10-CM | POA: Insufficient documentation

## 2011-05-20 DIAGNOSIS — R110 Nausea: Secondary | ICD-10-CM | POA: Insufficient documentation

## 2011-05-20 NOTE — Progress Notes (Signed)
Started after saw Dr. Jennette Kettle today, stomach felt tight but immediately started hurting after seeing dr., also left lower side around 1400 today

## 2011-05-20 NOTE — Telephone Encounter (Signed)
Pt c/o being in pain after having a pap smear done today. Pt states that she took 1 1/2 percocet at 2:00 pm and another 1/2 of one at 3. Pt knows that Dr. Lovell Sheehan is out of the office and wanted to know if there is something she could do to help with pain. Pt refused ED and Urgent Care, stating that when she is in pain this severe she usually ends up in the hospital getting pain meds intravenously.  Advised pt to give percocet more time to take effect while relaxing and she can try a warm compress on her abdomen to try to help alleviate some of the pain. Also advised her to take the percocet as rx'ed and if pain becomes unbearable then she should go the ED. Pt verbalized understanding

## 2011-05-21 ENCOUNTER — Encounter (HOSPITAL_COMMUNITY): Payer: Self-pay | Admitting: *Deleted

## 2011-05-21 ENCOUNTER — Inpatient Hospital Stay (HOSPITAL_COMMUNITY): Payer: 59

## 2011-05-21 LAB — URINALYSIS, ROUTINE W REFLEX MICROSCOPIC
Bilirubin Urine: NEGATIVE
Ketones, ur: NEGATIVE mg/dL
Nitrite: NEGATIVE
Specific Gravity, Urine: 1.015 (ref 1.005–1.030)
Urobilinogen, UA: 0.2 mg/dL (ref 0.0–1.0)

## 2011-05-21 MED ORDER — PROMETHAZINE HCL 25 MG/ML IJ SOLN
25.0000 mg | Freq: Once | INTRAVENOUS | Status: AC
  Administered 2011-05-21: 25 mg via INTRAVENOUS
  Filled 2011-05-21: qty 1

## 2011-05-21 MED ORDER — SIMETHICONE 80 MG PO CHEW
80.0000 mg | CHEWABLE_TABLET | Freq: Once | ORAL | Status: DC
  Filled 2011-05-21: qty 1

## 2011-05-21 MED ORDER — HYDROMORPHONE HCL 1 MG/ML IJ SOLN
2.0000 mg | Freq: Once | INTRAMUSCULAR | Status: AC
  Administered 2011-05-21: 2 mg via INTRAVENOUS
  Filled 2011-05-21: qty 2

## 2011-05-21 MED ORDER — HYDROMORPHONE HCL 2 MG/ML IJ SOLN: 2.0000 mg | Freq: Once | INTRAMUSCULAR | Status: DC

## 2011-05-21 NOTE — Progress Notes (Signed)
Pt presents to [facility] for abdominal pain.  States she went to dr for pap smear and after pelvic exam and pressed on abdomen.  States since then she has had sharp pain through stomach and in lower back.  Called dr office and took pain medicine at home.

## 2011-05-21 NOTE — Progress Notes (Signed)
N. Bascom Levels, CNM at bedside to discuss [place] results with pt.

## 2011-05-21 NOTE — ED Provider Notes (Signed)
History     Chief Complaint  Patient presents with  . Abdominal Pain   HPI 35 y.o.female c/o low abdominal pain and back pain. Pap smear today with Dr. Jennette Kettle, pain started right after pap, bloating, described as "pulling, tight", also left lower back pain. S/P hyst, still has ovaries, h/o endometriosis. On percocet x 3 years for chronic pain issues, history of multiple surgeries for endometriosis, hernia repairs, throat surgeries. Percocet not helping - 10/325 x 4 today. No vaginal bleeding. Has been having ongoing issues with pelvic and back pain - has pelvic u/s scheduled on 8/1.    Past Medical History  Diagnosis Date  . ABDOMINAL WALL HERNIA 02/27/2010  . ALLERGIC RHINITIS 06/02/2007  . ANOREXIA, CHRONIC 08/07/2008  . ANXIETY 09/15/2010  . BACK PAIN, THORACIC REGION 07/07/2007  . BACK PAIN, THORACIC REGION 07/07/2007  . CERVICAL RADICULOPATHY 12/11/2008  . Condyloma acuminatum 04/23/2009  . CONSTIPATION 09/15/2010  . DYSPHAGIA UNSPECIFIED 09/09/2009  . 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 ENDOMETRIOSIS 12/16/2009  . FATIGUE 06/11/2010  . GERD 10/23/2009  . HERPES SIMPLEX INFECTION 06/11/2010  . LENTIGO 04/23/2009  . LUNG NODULE 06/17/2010  . Palpitations 10/23/2009  . 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 Panic disorder without agoraphobia 08/07/2008  . Stricture and stenosis of esophagus 05/13/2010  . TOBACCO ABUSE 08/07/2008    Past Surgical History  Procedure Date  . Partial hysterectomy   . Endometrial ablation   . Hernia repair     X3    Family History  Problem Relation Age of Onset  . Depression Mother   . Arthritis Mother   . Hyperlipidemia Father   . Hypertension Father     History  Substance Use Topics  . Smoking status: Current Everyday Smoker -- 1.0 packs/day    Types: Cigarettes  . Smokeless tobacco: Not on file  . Alcohol Use: No    Allergies:  Allergies  Allergen Reactions  . Ondansetron Hcl Shortness Of Breath and Anxiety    REACTION: unspecified  .  Chantix (Varenicline Tartrate)     REACTION: "went crazy"  . Nicotine     REACTION: unspecified  . Skelaxin     "don't remember but I know I can't take it"  . Penicillins Rash    Prescriptions prior to admission  Medication Sig Dispense Refill  . ALPRAZolam (XANAX) 0.5 MG tablet Take by mouth. 1 tablet 6 times daily       . lansoprazole (PREVACID SOLUTAB) 30 MG disintegrating tablet Take 30 mg by mouth daily as needed.        Marland Kitchen oxyCODONE-acetaminophen (PERCOCET) 10-325 MG per tablet Take 1 tablet by mouth every 4 (four) hours as needed.        . sodium phosphate (FLEET) 7-19 GM/118ML ENEM Place 1 enema rectally as needed. For constipation      . cephALEXin (KEFLEX) 250 MG/5ML suspension Take 5 mLs (250 mg total) by mouth 4 (four) times daily. X 7 days.  100 mL  0  . escitalopram (LEXAPRO) 20 MG tablet Take 10 mg by mouth daily.        . magnesium citrate solution Take 296 mLs by mouth as needed.        . Nutritional Supplements (ENSURE PO) Take by mouth daily.        . penicillin potassium (VEETID) 125 MG/5ML solution 4 teaspoons 4 times daily  600 mL  0    Review of Systems  Constitutional: Negative.   Respiratory: Negative.   Cardiovascular:  Negative.   Gastrointestinal: Positive for nausea and abdominal pain. Negative for vomiting.       C/o painful BMs, has IBS  Genitourinary: Negative for dysuria, urgency, frequency, hematuria and flank pain.  Musculoskeletal: Positive for back pain.  Neurological: Negative.   Psychiatric/Behavioral: Positive for depression.   Physical Exam   Blood pressure 105/49, pulse 75, temperature 98.4 F (36.9 C), temperature source Oral, resp. rate 20, height 5\' 7"  (1.702 m), weight 63.504 kg (140 lb).  Physical Exam  Constitutional: She is oriented to person, place, and time. She appears well-developed and well-nourished. She appears distressed.  Cardiovascular: Normal rate.   Respiratory: Effort normal. No respiratory distress.  GI: She  exhibits distension. She exhibits no mass. There is tenderness (diffuse, greatest in RLQ and LUQ). There is guarding. There is no rebound.  Musculoskeletal: Normal range of motion.  Neurological: She is alert and oriented to person, place, and time.  Skin: Skin is warm and dry.  Psychiatric: She has a normal mood and affect.    MAU Course  Procedures Consult with Dr. Arelia Sneddon - Dilaudid and Phenergan IV, pelvic u/s - call with abnormal results or if pain is not well controlled  U/S - no masses noted, ovaries not seen, uterus surgically absent. Pt seems unsure if she still has her ovaries or not.   Pt states her pain is better when at rest following dilaudid and phenergan, but still has pain with standing/moving. Pt states she is ready to go home.   Assessment and Plan  Pelvic pain - chronic pain, h/o endometriosis - emergent gyn sources of pain not evident tonight Pt d/c'd home with precautions, to f/u with PCP if symptoms not improved by the morning. If she feels she requires further emergency evaluation, will go to [facility] ED for general medical eval.   Gina [name] 05/21/2011, 12:46 AM

## 2011-05-25 ENCOUNTER — Telehealth: Payer: Self-pay | Admitting: Internal Medicine

## 2011-05-25 MED ORDER — OXYCODONE-ACETAMINOPHEN 10-325 MG PO TABS: 1.0000 | ORAL_TABLET | ORAL | Status: DC | PRN

## 2011-05-25 NOTE — Telephone Encounter (Signed)
Pt. Requesting refill on oxyCODONE-acetaminophen (PERCOCET) 10-325 MG per tablet

## 2011-06-11 ENCOUNTER — Inpatient Hospital Stay (HOSPITAL_COMMUNITY): Payer: 59

## 2011-06-11 ENCOUNTER — Inpatient Hospital Stay (HOSPITAL_COMMUNITY)
Admission: AD | Admit: 2011-06-11 | Discharge: 2011-06-11 | Disposition: A | Discharge status: Home / Self Care | Payer: 59 | Source: Ambulatory Visit | Attending: Obstetrics and Gynecology | Admitting: Obstetrics and Gynecology

## 2011-06-11 ENCOUNTER — Encounter (HOSPITAL_COMMUNITY): Payer: Self-pay | Admitting: *Deleted

## 2011-06-11 DIAGNOSIS — G8918 Other acute postprocedural pain: Secondary | ICD-10-CM

## 2011-06-11 DIAGNOSIS — R109 Unspecified abdominal pain: Secondary | ICD-10-CM | POA: Insufficient documentation

## 2011-06-11 HISTORY — DX: Palpitations: R00.2

## 2011-06-11 HISTORY — DX: Reserved for concepts with insufficient information to code with codable children: IMO0002

## 2011-06-11 HISTORY — DX: Fibromyalgia: M79.7

## 2011-06-11 HISTORY — DX: Unspecified ovarian cyst, unspecified side: N83.209

## 2011-06-11 HISTORY — DX: Rheumatoid arthritis, unspecified: M06.9

## 2011-06-11 HISTORY — DX: Urinary tract infection, site not specified: N39.0

## 2011-06-11 HISTORY — DX: Unspecified abnormal cytological findings in specimens from cervix uteri: R87.619

## 2011-06-11 LAB — URINALYSIS, ROUTINE W REFLEX MICROSCOPIC
Bilirubin Urine: NEGATIVE
Glucose, UA: NEGATIVE mg/dL
Hgb urine dipstick: NEGATIVE
Protein, ur: NEGATIVE mg/dL
Specific Gravity, Urine: 1.01 (ref 1.005–1.030)

## 2011-06-11 LAB — CBC
HCT: 39.8 % (ref 36.0–46.0)
MCH: 32 pg (ref 26.0–34.0)
MCV: 93 fL (ref 78.0–100.0)
RBC: 4.28 MIL/uL (ref 3.87–5.11)
WBC: 7.8 10*3/uL (ref 4.0–10.5)

## 2011-06-11 LAB — DIFFERENTIAL
Eosinophils Relative: 0 % (ref 0–5)
Lymphocytes Relative: 10 % — ABNORMAL LOW (ref 12–46)
Lymphs Abs: 0.8 10*3/uL (ref 0.7–4.0)
Monocytes Absolute: 0.1 10*3/uL (ref 0.1–1.0)

## 2011-06-11 MED ORDER — HYDROMORPHONE HCL 1 MG/ML IJ SOLN
1.0000 mg | Freq: Once | INTRAMUSCULAR | Status: AC
  Administered 2011-06-11: 1 mg via INTRAVENOUS
  Filled 2011-06-11: qty 1

## 2011-06-11 MED ORDER — HYDROMORPHONE HCL 1 MG/ML IJ SOLN
INTRAMUSCULAR | Status: AC
  Filled 2011-06-11: qty 2

## 2011-06-11 MED ORDER — PROMETHAZINE HCL 25 MG/ML IJ SOLN
25.0000 mg | Freq: Once | INTRAMUSCULAR | Status: AC
  Administered 2011-06-11: 25 mg via INTRAVENOUS
  Filled 2011-06-11: qty 1

## 2011-06-11 MED ORDER — HYDROMORPHONE HCL 1 MG/ML IJ SOLN
2.0000 mg | Freq: Once | INTRAMUSCULAR | Status: AC
  Administered 2011-06-11: 2 mg via INTRAMUSCULAR

## 2011-06-11 MED ORDER — LACTATED RINGERS IV SOLN
INTRAVENOUS | Status: DC
  Administered 2011-06-11: 18:00:00 via INTRAVENOUS

## 2011-06-11 NOTE — ED Notes (Signed)
No change in pain status, points to lower abd/suprapubic area.  Bowel sounds auscultated in all 4 quadrants.  Notified PA of status.

## 2011-06-11 NOTE — ED Provider Notes (Addendum)
History   Pt presents today c/o severe lower abd pain. She underwent a diagnostic laparoscopy this am with ablation of endometrial implants. She states she has not voided since about 7am. She denies fever. Pts husband states she goes through this every time she has a procedure because she has no pain tolerance and has built up an immunity to pain medications.  Chief Complaint  Patient presents with  . Abdominal Pain   HPI  OB History    Grav Para Term Preterm Abortions TAB SAB Ect Mult Living   3 2 2  0 1 0 1 0 0 2      Past Medical History  Diagnosis Date  . ABDOMINAL WALL HERNIA 02/27/2010  . ALLERGIC RHINITIS 06/02/2007  . ANOREXIA, CHRONIC 08/07/2008  . ANXIETY 09/15/2010  . BACK PAIN, THORACIC REGION 07/07/2007  . BACK PAIN, THORACIC REGION 07/07/2007  . CERVICAL RADICULOPATHY 12/11/2008  . Condyloma acuminatum 04/23/2009  . CONSTIPATION 09/15/2010  . DYSPHAGIA UNSPECIFIED 09/09/2009  . 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 ENDOMETRIOSIS 12/16/2009  . FATIGUE 06/11/2010  . GERD 10/23/2009  . HERPES SIMPLEX INFECTION 06/11/2010  . LENTIGO 04/23/2009  . LUNG NODULE 06/17/2010  . Palpitations 10/23/2009  . 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 Panic disorder without agoraphobia 08/07/2008  . Stricture and stenosis of esophagus 05/13/2010  . TOBACCO ABUSE 08/07/2008  . Heart palpitations   . Urinary tract infection   . Fibromyalgia   . Rheumatoid arthritis   . Ovarian cyst   . Abnormal Pap smear     Past Surgical History  Procedure Date  . Partial hysterectomy   . Endometrial ablation   . Hernia repair     X3  . Dilation and curettage of uterus   . Cesarean section     Family History  Problem Relation Age of Onset  . Depression Mother   . Arthritis Mother   . Hyperlipidemia Father   . Hypertension Father     History  Substance Use Topics  . Smoking status: Current Everyday Smoker -- 1.0 packs/day    Types: Cigarettes  . Smokeless tobacco: Not on file  . Alcohol  Use: No    Allergies:  Allergies  Allergen Reactions  . Ondansetron Hcl Shortness Of Breath and Anxiety    REACTION: unspecified  . Chantix (Varenicline Tartrate)     REACTION: "went crazy"  . Nicotine     REACTION: unspecified  . Skelaxin     "don't remember but I know I can't take it"  . Penicillins Rash    Prescriptions prior to admission  Medication Sig Dispense Refill  . ALPRAZolam (XANAX) 0.5 MG tablet Take by mouth. 1 tablet 6 times daily       . escitalopram (LEXAPRO) 20 MG tablet Take 10 mg by mouth daily.        Marland Kitchen oxyCODONE-acetaminophen (PERCOCET) 10-325 MG per tablet Take 1 tablet by mouth every 4 (four) hours as needed.  100 tablet  0  . sodium phosphate (FLEET) 7-19 GM/118ML ENEM Place 1 enema rectally as needed. For constipation        Review of Systems  Constitutional: Negative for fever and chills.  Cardiovascular: Negative for chest pain.  Gastrointestinal: Positive for nausea and abdominal pain. Negative for vomiting, diarrhea and constipation.  Genitourinary: Negative for dysuria, urgency, frequency, hematuria and flank pain.  Neurological: Negative for dizziness and headaches.  Psychiatric/Behavioral: Negative for depression and suicidal ideas.   Physical Exam   Blood pressure 103/59, pulse 72,  temperature 98 F (36.7 C), temperature source Oral, resp. rate 20.  Physical Exam  Constitutional: She is oriented to person, place, and time. She appears well-developed and well-nourished. No distress.  HENT:  Head: Normocephalic and atraumatic.  GI: Soft. She exhibits no distension. There is tenderness. There is guarding. There is no rebound.  Neurological: She is alert and oriented to person, place, and time.  Skin: Skin is warm and dry. She is not diaphoretic.  Psychiatric: She has a normal mood and affect. Her behavior is normal. Judgment and thought content normal.    MAU Course  Procedures  Discussed pt with Dr. Jennette Kettle at length. He states she does  not need any imaging. He would like to check a CBC. If normal then dc to home. She has f/u scheduled.  Results for orders placed during the hospital encounter of 06/11/11 (from the past 24 hour(s))  URINALYSIS, ROUTINE W REFLEX MICROSCOPIC     Status: Abnormal   Collection Time   06/11/11  4:57 PM      Component Value Range   Color, Urine YELLOW  YELLOW    Appearance HAZY (*) CLEAR    Specific Gravity, Urine 1.010  1.005 - 1.030    pH 8.0  5.0 - 8.0    Glucose, UA NEGATIVE  NEGATIVE (mg/dL)   Hgb urine dipstick NEGATIVE  NEGATIVE    Bilirubin Urine NEGATIVE  NEGATIVE    Ketones, ur NEGATIVE  NEGATIVE (mg/dL)   Protein, ur NEGATIVE  NEGATIVE (mg/dL)   Urobilinogen, UA 0.2  0.0 - 1.0 (mg/dL)   Nitrite NEGATIVE  NEGATIVE    Leukocytes, UA NEGATIVE  NEGATIVE   CBC     Status: Normal   Collection Time   06/11/11  5:32 PM      Component Value Range   WBC 7.8  4.0 - 10.5 (K/uL)   RBC 4.28  3.87 - 5.11 (MIL/uL)   Hemoglobin 13.7  12.0 - 15.0 (g/dL)   HCT 19.1  47.8 - 29.5 (%)   MCV 93.0  78.0 - 100.0 (fL)   MCH 32.0  26.0 - 34.0 (pg)   MCHC 34.4  30.0 - 36.0 (g/dL)   RDW 62.1  30.8 - 65.7 (%)   Platelets 175  150 - 400 (K/uL)  DIFFERENTIAL     Status: Abnormal   Collection Time   06/11/11  5:32 PM      Component Value Range   Neutrophils Relative 89 (*) 43 - 77 (%)   Neutro Abs 6.9  1.7 - 7.7 (K/uL)   Lymphocytes Relative 10 (*) 12 - 46 (%)   Lymphs Abs 0.8  0.7 - 4.0 (K/uL)   Monocytes Relative 1 (*) 3 - 12 (%)   Monocytes Absolute 0.1  0.1 - 1.0 (K/uL)   Eosinophils Relative 0  0 - 5 (%)   Eosinophils Absolute 0.0  0.0 - 0.7 (K/uL)   Basophils Relative 0  0 - 1 (%)   Basophils Absolute 0.0  0.0 - 0.1 (K/uL)   Dg Abd 1 View  06/11/2011  *RADIOLOGY REPORT*  Clinical Data: Pain.  ABDOMEN - 1 VIEW  Comparison: CT abdomen and pelvis and plain films abdomen 02/18/2011.  Findings: Mesh from prior hernia repair is identified.  There is no evidence of bowel obstruction.  Moderate stool  burden is present.  IMPRESSION: No acute finding.  Original Report Authenticated By: Bernadene Bell. Maricela Curet, M.D.     Assessment and Plan  Post op pain: discussed with pt  at length. There is no acute process. Pt has very low pain tolerance. She has f/u scheduled with Dr. Jennette Kettle.  Clinton Gallant. Rice III, DrHSc, MPAS, PA-C  06/11/2011, 5:13 PM   Henrietta Hoover, PA 06/11/11 1943

## 2011-06-11 NOTE — Progress Notes (Signed)
Had laproscopic surgery today for endometriosis.  Unable to void after surgery, states had not voided since 0700. Pain got worse when walking in to hosp.  Has taken pain meds- not working.  C/o dry mouth.

## 2011-06-16 ENCOUNTER — Telehealth: Payer: Self-pay | Admitting: Internal Medicine

## 2011-06-16 MED ORDER — OXYCODONE-ACETAMINOPHEN 10-325 MG PO TABS
1.0000 | ORAL_TABLET | ORAL | Status: DC | PRN
Start: 2011-06-16 — End: 2011-07-02

## 2011-06-16 NOTE — Telephone Encounter (Signed)
Script is ready f or dr Lovell Sheehan is sign

## 2011-06-16 NOTE — Telephone Encounter (Signed)
Refill Oxycodone 

## 2011-07-02 ENCOUNTER — Emergency Department (HOSPITAL_BASED_OUTPATIENT_CLINIC_OR_DEPARTMENT_OTHER)
Admission: EM | Admit: 2011-07-02 | Discharge: 2011-07-03 | Disposition: A | Discharge status: Home / Self Care | Payer: 59 | Attending: Emergency Medicine | Admitting: Emergency Medicine

## 2011-07-02 ENCOUNTER — Emergency Department (INDEPENDENT_AMBULATORY_CARE_PROVIDER_SITE_OTHER): Payer: 59

## 2011-07-02 ENCOUNTER — Encounter (HOSPITAL_BASED_OUTPATIENT_CLINIC_OR_DEPARTMENT_OTHER): Payer: Self-pay | Admitting: *Deleted

## 2011-07-02 DIAGNOSIS — IMO0001 Reserved for inherently not codable concepts without codable children: Secondary | ICD-10-CM | POA: Insufficient documentation

## 2011-07-02 DIAGNOSIS — R51 Headache: Secondary | ICD-10-CM

## 2011-07-02 DIAGNOSIS — F17200 Nicotine dependence, unspecified, uncomplicated: Secondary | ICD-10-CM | POA: Insufficient documentation

## 2011-07-02 LAB — BASIC METABOLIC PANEL
BUN: 10 mg/dL (ref 6–23)
Calcium: 9.1 mg/dL (ref 8.4–10.5)
Creatinine, Ser: <0.47 mg/dL — ABNORMAL LOW (ref 0.50–1.10)

## 2011-07-02 LAB — CBC
HCT: 41.4 % (ref 36.0–46.0)
MCH: 32.2 pg (ref 26.0–34.0)
MCHC: 35.3 g/dL (ref 30.0–36.0)
MCV: 91.4 fL (ref 78.0–100.0)
RDW: 12 % (ref 11.5–15.5)

## 2011-07-02 MED ORDER — FENTANYL CITRATE 0.05 MG/ML IJ SOLN
100.0000 ug | Freq: Once | INTRAMUSCULAR | Status: AC
  Administered 2011-07-03: 100 ug via INTRAVENOUS
  Filled 2011-07-02: qty 2

## 2011-07-02 MED ORDER — SODIUM CHLORIDE 0.9 % IV SOLN
Freq: Once | INTRAVENOUS | Status: AC
  Administered 2011-07-02: 23:00:00 via INTRAVENOUS

## 2011-07-02 MED ORDER — PROMETHAZINE HCL 25 MG/ML IJ SOLN
25.0000 mg | Freq: Once | INTRAMUSCULAR | Status: AC
  Administered 2011-07-02: 25 mg via INTRAVENOUS
  Filled 2011-07-02: qty 1

## 2011-07-02 NOTE — ED Notes (Signed)
Dr Molpus has assessed pt 

## 2011-07-02 NOTE — ED Notes (Signed)
Headache since yesterday am. Left arm numbness. Vomiting. Fatigue.

## 2011-07-02 NOTE — ED Provider Notes (Signed)
History     CSN: [id number] Arrival date & time: 07/02/2011 10:05 PM  Chief Complaint  Patient presents with  . Headache   HPI This is a 35 year old white female. She complains of a headache this began abruptly yesterday morning and is worsened since. The headache is located in the posterior of the head and is throbbing in nature. The pain is severe.  This is associated with nausea, retching and photophobia. She denies focal neurologic deficit. She was medicated with Phenergan 25 mg prior to my evaluation and is somnolent and dysarthric. A level of 5 caveat applies. She does not have a history of similar headaches.  Past Medical History  Diagnosis Date  . ABDOMINAL WALL HERNIA 02/27/2010  . ALLERGIC RHINITIS 06/02/2007  . ANOREXIA, CHRONIC 08/07/2008  . ANXIETY 09/15/2010  . BACK PAIN, THORACIC REGION 07/07/2007  . BACK PAIN, THORACIC REGION 07/07/2007  . CERVICAL RADICULOPATHY 12/11/2008  . Condyloma acuminatum 04/23/2009  . CONSTIPATION 09/15/2010  . DYSPHAGIA UNSPECIFIED 09/09/2009  . 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 ENDOMETRIOSIS 12/16/2009  . FATIGUE 06/11/2010  . GERD 10/23/2009  . HERPES SIMPLEX INFECTION 06/11/2010  . LENTIGO 04/23/2009  . LUNG NODULE 06/17/2010  . Palpitations 10/23/2009  . 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 Panic disorder without agoraphobia 08/07/2008  . Stricture and stenosis of esophagus 05/13/2010  . TOBACCO ABUSE 08/07/2008  . Heart palpitations   . Urinary tract infection   . Fibromyalgia   . Rheumatoid arthritis   . Ovarian cyst   . Abnormal Pap smear     Past Surgical History  Procedure Date  . Partial hysterectomy   . Endometrial ablation   . Hernia repair     X3  . Dilation and curettage of uterus   . Cesarean section     Family History  Problem Relation Age of Onset  . Depression Mother   . Arthritis Mother   . Hyperlipidemia Father   . Hypertension Father     History  Substance Use Topics  . Smoking status: Current Everyday  Smoker -- 1.0 packs/day    Types: Cigarettes  . Smokeless tobacco: Not on file  . Alcohol Use: No    OB History    Grav Para Term Preterm Abortions TAB SAB Ect Mult Living   3 2 2  0 1 0 1 0 0 2      Review of Systems  Unable to perform ROS   Physical Exam  BP 95/49  Pulse 90  Temp(Src) 98.1 F (36.7 C) (Oral)  Resp 20  SpO2 98%  Physical Exam General: Well-developed, well-nourished female in no acute distress; appearance consistent with age of record HENT: normocephalic, atraumatic Eyes: Examination limited by severe photophobia Neck: supple Heart: regular rate and rhythm Lungs: clear to auscultation bilaterally Abdomen: Soft, nondistended; mild to moderate diffuse tenderness status post recent laparoscopy for endometriosis Extremities: No deformity; full range of motion Neurologic: Awake but lethargic; dysarthria and ataxia present; motor function intact in all extremities and symmetric; no pronator drift Skin: Warm and dry   ED Course  Procedures  MDM  Nursing notes and vitals signs, including pulse oximetry, reviewed.  Summary of this visit's results, reviewed by myself:  Labs:  Results for orders placed during the hospital encounter of 07/02/11  CBC      Component Value Range   WBC 5.3  4.0 - 10.5 (K/uL)   RBC 4.53  3.87 - 5.11 (MIL/uL)   Hemoglobin 14.6  12.0 - 15.0 (g/dL)  HCT 41.4  36.0 - 46.0 (%)   MCV 91.4  78.0 - 100.0 (fL)   MCH 32.2  26.0 - 34.0 (pg)   MCHC 35.3  30.0 - 36.0 (g/dL)   RDW 40.9  81.1 - 91.4 (%)   Platelets 182  150 - 400 (K/uL)  BASIC METABOLIC PANEL      Component Value Range   Sodium 138  135 - 145 (mEq/L)   Potassium 3.8  3.5 - 5.1 (mEq/L)   Chloride 103  96 - 112 (mEq/L)   CO2 27  19 - 32 (mEq/L)   Glucose, Bld 88  70 - 99 (mg/dL)   BUN 10  6 - 23 (mg/dL)   Creatinine, Ser <7.82 (*) 0.50 - 1.10 (mg/dL)   Calcium 9.1  8.4 - 95.6 (mg/dL)   GFR calc non Af Amer NOT CALCULATED  >60 (mL/min)   GFR calc Af Amer NOT  CALCULATED  >60 (mL/min)  URINALYSIS, ROUTINE W REFLEX MICROSCOPIC      Component Value Range   Color, Urine YELLOW  YELLOW    Appearance CLEAR  CLEAR    Specific Gravity, Urine 1.005  1.005 - 1.030    pH 6.5  5.0 - 8.0    Glucose, UA NEGATIVE  NEGATIVE (mg/dL)   Hgb urine dipstick NEGATIVE  NEGATIVE    Bilirubin Urine NEGATIVE  NEGATIVE    Ketones, ur NEGATIVE  NEGATIVE (mg/dL)   Protein, ur NEGATIVE  NEGATIVE (mg/dL)   Urobilinogen, UA 0.2  0.0 - 1.0 (mg/dL)   Nitrite NEGATIVE  NEGATIVE    Leukocytes, UA NEGATIVE  NEGATIVE     Imaging Studies: Dg Abd 1 View  06/11/2011  *RADIOLOGY REPORT*  Clinical Data: Pain.  ABDOMEN - 1 VIEW  Comparison: CT abdomen and pelvis and plain films abdomen 02/18/2011.  Findings: Mesh from prior hernia repair is identified.  There is no evidence of bowel obstruction.  Moderate stool burden is present.  IMPRESSION: No acute finding.  Original Report Authenticated By: Bernadene Bell. D'ALESSIO, M.D.   1:12 AM Feels better. Still somnolent. Stay she is reassured at negative head CT. She would prefer to go home. She requests no prescriptions.  Hanley Seamen, MD 07/03/11 [phone]

## 2011-07-03 LAB — URINALYSIS, ROUTINE W REFLEX MICROSCOPIC
Bilirubin Urine: NEGATIVE
Ketones, ur: NEGATIVE mg/dL
Nitrite: NEGATIVE
Protein, ur: NEGATIVE mg/dL
pH: 6.5 (ref 5.0–8.0)

## 2011-07-03 NOTE — ED Notes (Signed)
Pt wheeled to bathroom

## 2011-07-08 ENCOUNTER — Ambulatory Visit (INDEPENDENT_AMBULATORY_CARE_PROVIDER_SITE_OTHER): Payer: 59 | Admitting: Family Medicine

## 2011-07-08 ENCOUNTER — Encounter: Payer: Self-pay | Admitting: Family Medicine

## 2011-07-08 VITALS — BP 98/60 | Temp 98.4°F | Wt 143.0 lb

## 2011-07-08 DIAGNOSIS — J209 Acute bronchitis, unspecified: Secondary | ICD-10-CM

## 2011-07-08 MED ORDER — AZITHROMYCIN 250 MG PO TABS: ORAL_TABLET | ORAL | Status: AC

## 2011-07-08 NOTE — Progress Notes (Signed)
  Subjective:    Patient ID: Gina [name], female    DOB: 19-Jul-1976, 35 y.o.   MRN: [id number]  HPI Patient seen with almost one-week history of upper respiratory congestive symptoms and cough productive of yellow-green sputum. She's tried over-the-counter medications with minimal relief. Minimal nasal congestion. Congestion mostly in chest. Does have a long history of smoking. Denies any nausea or vomiting. Minimal sore throat initially. No localized facial pain.  Recent headache last week. Went to emergency room. CT scan unremarkable. Received IV Phenergan and pain medication with eventual improvement. No significant headache at this time.   Review of Systems  Constitutional: Negative for fever, chills and fatigue.  HENT: Positive for congestion and sore throat. Negative for ear pain.   Respiratory: Positive for cough. Negative for shortness of breath and wheezing.   Cardiovascular: Negative for chest pain.       Objective:   Physical Exam  Constitutional: She appears well-developed and well-nourished.  HENT:  Right Ear: External ear normal.  Left Ear: External ear normal.  Mouth/Throat: Oropharynx is clear and moist.  Neck: Neck supple.  Cardiovascular: Normal rate and regular rhythm.   Pulmonary/Chest: Effort normal and breath sounds normal. No respiratory distress. She has no wheezes. She has no rales.  Lymphadenopathy:    She has no cervical adenopathy.          Assessment & Plan:  Acute bronchitis. Patient is encouraged to quit smoking. Given duration of productive cough we'll start Zithromax. Touch base if cough not resolving over the next couple of weeks

## 2011-07-15 ENCOUNTER — Telehealth: Payer: Self-pay | Admitting: Internal Medicine

## 2011-07-15 MED ORDER — OXYCODONE-ACETAMINOPHEN 10-325 MG PO TABS: 1.0000 | ORAL_TABLET | ORAL | Status: DC | PRN

## 2011-07-15 NOTE — Telephone Encounter (Signed)
Per dr Lovell Sheehan- may tell pt to use robitussin cold and cough otc and let her kn ow her script is ready for pickuup

## 2011-07-15 NOTE — Telephone Encounter (Signed)
Pt is already taking Robitussin cold and cough with no relief.

## 2011-07-15 NOTE — Telephone Encounter (Signed)
Pt needs new rx oxycodone 10-325mg  also pt saw dr Caryl Never and was prescribed z-pack . Pt stated she still have cough with minimal relief. Karin Golden [address] [phone]

## 2011-07-26 LAB — URINALYSIS, ROUTINE W REFLEX MICROSCOPIC
Bilirubin Urine: NEGATIVE
Glucose, UA: NEGATIVE
Hgb urine dipstick: NEGATIVE
Ketones, ur: NEGATIVE
Nitrite: NEGATIVE
Protein, ur: NEGATIVE
Specific Gravity, Urine: 1.006
Urobilinogen, UA: 0.2
pH: 7.5

## 2011-07-26 LAB — CBC
HCT: 38.6
Hemoglobin: 13.6
MCHC: 35.3
MCV: 93.8
Platelets: 204
RBC: 4.12
RDW: 12.4
WBC: 7.2

## 2011-07-26 LAB — I-STAT 8, (EC8 V) (CONVERTED LAB)
Acid-base deficit: 2
BUN: 6
Bicarbonate: 21.2
Chloride: 108
Glucose, Bld: 92
HCT: 40
Hemoglobin: 13.6
Operator id: 196461
Potassium: 3.9
Sodium: 139
TCO2: 22
pCO2, Ven: 32.1 — ABNORMAL LOW
pH, Ven: 7.428 — ABNORMAL HIGH

## 2011-07-26 LAB — DIFFERENTIAL
Basophils Absolute: 0.1
Basophils Relative: 2 — ABNORMAL HIGH
Eosinophils Absolute: 0.3
Eosinophils Relative: 4
Lymphocytes Relative: 29
Lymphs Abs: 2.1
Monocytes Absolute: 0.7
Monocytes Relative: 9
Neutro Abs: 4.1
Neutrophils Relative %: 57

## 2011-07-26 LAB — POCT I-STAT CREATININE
Creatinine, Ser: 0.7
Operator id: 196461

## 2011-07-29 ENCOUNTER — Telehealth: Payer: Self-pay

## 2011-07-29 ENCOUNTER — Ambulatory Visit (INDEPENDENT_AMBULATORY_CARE_PROVIDER_SITE_OTHER): Payer: 59 | Admitting: Internal Medicine

## 2011-07-29 ENCOUNTER — Encounter: Payer: Self-pay | Admitting: Internal Medicine

## 2011-07-29 DIAGNOSIS — F17200 Nicotine dependence, unspecified, uncomplicated: Secondary | ICD-10-CM

## 2011-07-29 DIAGNOSIS — R5383 Other fatigue: Secondary | ICD-10-CM

## 2011-07-29 DIAGNOSIS — J309 Allergic rhinitis, unspecified: Secondary | ICD-10-CM

## 2011-07-29 DIAGNOSIS — R5381 Other malaise: Secondary | ICD-10-CM

## 2011-07-29 LAB — COMPREHENSIVE METABOLIC PANEL
ALT: 13
AST: 17
Alkaline Phosphatase: 72
CO2: 23
Chloride: 110
GFR calc Af Amer: >60
GFR calc non Af Amer: >60
Glucose, Bld: 86
Sodium: 140
Total Bilirubin: 0.7

## 2011-07-29 LAB — LIPASE, BLOOD: Lipase: 20

## 2011-07-29 LAB — CBC
Hemoglobin: 14.1
MCHC: 35.7
RBC: 4.14
WBC: 7

## 2011-07-29 LAB — URINALYSIS, ROUTINE W REFLEX MICROSCOPIC
Bilirubin Urine: NEGATIVE
Ketones, ur: NEGATIVE
Nitrite: NEGATIVE
Protein, ur: NEGATIVE
Urobilinogen, UA: 0.2
pH: 7.5

## 2011-07-29 LAB — DIFFERENTIAL
Basophils Absolute: 0.1
Basophils Relative: 1
Eosinophils Absolute: 0.1
Eosinophils Relative: 1
Neutrophils Relative %: 49

## 2011-07-29 MED ORDER — BENZONATATE 100 MG PO CAPS: 100.0000 mg | ORAL_CAPSULE | Freq: Three times a day (TID) | ORAL | Status: DC | PRN

## 2011-07-29 NOTE — Telephone Encounter (Signed)
Attempt to call- ans mach - LMTCB if questions - pt had requested liquid cough med instead of tesslon perles - per dr. Amador Cunas - there is no liquid for that - keep using robitussin dm , has percocet for pain, and hopfully depo shot we gave will help. KIK

## 2011-07-29 NOTE — Patient Instructions (Signed)
Get plenty of rest, Drink lots of  clear liquids, and use Tylenol or ibuprofen for fever and discomfort.    Smoking tobacco is very bad for your health. You should stop smoking immediately.  May take Robitussin or Mucinex  Cough medication as discussed  Continue Flovent once daily

## 2011-07-29 NOTE — Progress Notes (Signed)
  Subjective:    Patient ID: Gina [name], female    DOB: 1976/02/18, 35 y.o.   MRN: [id number]  HPI  35 year old patient who has a history of dysthymic disorder and ongoing tobacco abuse. She has been seen here and treated for bronchitis with azithromycin. She's also been seen at an urgent care where chest x-ray revealed no active disease. She complains of persistent cough and congestion. She states that her smoking consumption has increased. Cough is minimally productive she also has a history of allergic rhinitis. At the present time Flovent has been added to her regimen she denies any wheezing or shortness of breath medical regimen includes oxycodone for pain.    Review of Systems  Constitutional: Positive for fatigue.  HENT: Positive for congestion and rhinorrhea. Negative for hearing loss, sore throat, dental problem, sinus pressure and tinnitus.   Eyes: Negative for pain, discharge and visual disturbance.  Respiratory: Positive for cough. Negative for shortness of breath.   Cardiovascular: Negative for chest pain, palpitations and leg swelling.  Gastrointestinal: Negative for nausea, vomiting, abdominal pain, diarrhea, constipation, blood in stool and abdominal distention.  Genitourinary: Negative for dysuria, urgency, frequency, hematuria, flank pain, vaginal bleeding, vaginal discharge, difficulty urinating, vaginal pain and pelvic pain.  Musculoskeletal: Negative for joint swelling, arthralgias and gait problem.  Skin: Negative for rash.  Neurological: Negative for dizziness, syncope, speech difficulty, weakness, numbness and headaches.  Hematological: Negative for adenopathy.  Psychiatric/Behavioral: Positive for dysphoric mood. Negative for behavioral problems and agitation. The patient is not nervous/anxious.        Objective:   Physical Exam  Constitutional: She is oriented to person, place, and time. She appears well-developed and well-nourished. No distress.  HENT:  Head:  Normocephalic.  Right Ear: External ear normal.  Left Ear: External ear normal.  Mouth/Throat: Oropharynx is clear and moist.  Eyes: Conjunctivae and EOM are normal. Pupils are equal, round, and reactive to light.  Neck: Normal range of motion. Neck supple. No thyromegaly present.  Cardiovascular: Normal rate, regular rhythm, normal heart sounds and intact distal pulses.   Pulmonary/Chest: Effort normal and breath sounds normal. No respiratory distress. She has no wheezes.       O2 saturation 97  Abdominal: Soft. Bowel sounds are normal. She exhibits no mass. There is no tenderness.  Musculoskeletal: Normal range of motion.  Lymphadenopathy:    She has no cervical adenopathy.  Neurological: She is alert and oriented to person, place, and time.  Skin: Skin is warm and dry. No rash noted.  Psychiatric: She has a normal mood and affect. Her behavior is normal.          Assessment & Plan:   Viral URI Ongoing tobacco abuse Chronic pain syndrome  Cessation of smoking encouraged she is given samples of Flovent. She will be maintained on guaifenesin. A prescription for Tessalon also dispensed

## 2011-08-03 ENCOUNTER — Other Ambulatory Visit: Payer: Self-pay | Admitting: Gastroenterology

## 2011-08-04 ENCOUNTER — Ambulatory Visit
Admission: RE | Admit: 2011-08-04 | Discharge: 2011-08-04 | Disposition: A | Discharge status: Home / Self Care | Payer: 59 | Source: Ambulatory Visit | Attending: Gastroenterology | Admitting: Gastroenterology

## 2011-08-05 ENCOUNTER — Emergency Department (HOSPITAL_COMMUNITY)
Admission: EM | Admit: 2011-08-05 | Discharge: 2011-08-05 | Disposition: A | Discharge status: Home / Self Care | Payer: 59 | Attending: Emergency Medicine | Admitting: Emergency Medicine

## 2011-08-05 DIAGNOSIS — R5381 Other malaise: Secondary | ICD-10-CM | POA: Insufficient documentation

## 2011-08-05 DIAGNOSIS — K222 Esophageal obstruction: Secondary | ICD-10-CM | POA: Insufficient documentation

## 2011-08-05 LAB — URINALYSIS, ROUTINE W REFLEX MICROSCOPIC
Bilirubin Urine: NEGATIVE
Glucose, UA: NEGATIVE mg/dL
Hgb urine dipstick: NEGATIVE
Ketones, ur: NEGATIVE mg/dL
Leukocytes, UA: NEGATIVE
Nitrite: NEGATIVE
Protein, ur: NEGATIVE mg/dL
Specific Gravity, Urine: 1.022 (ref 1.005–1.030)
Urobilinogen, UA: 0.2 mg/dL (ref 0.0–1.0)
pH: 7.5 (ref 5.0–8.0)

## 2011-08-05 LAB — URINE MICROSCOPIC-ADD ON

## 2011-08-05 LAB — POCT I-STAT, CHEM 8
Calcium, Ion: 1.13 mmol/L (ref 1.12–1.32)
Creatinine, Ser: 0.5 mg/dL (ref 0.50–1.10)
Glucose, Bld: 81 mg/dL (ref 70–99)
Hemoglobin: 14.6 g/dL (ref 12.0–15.0)
Potassium: 4 mEq/L (ref 3.5–5.1)

## 2011-08-06 ENCOUNTER — Telehealth: Payer: Self-pay | Admitting: Internal Medicine

## 2011-08-06 ENCOUNTER — Other Ambulatory Visit: Payer: Self-pay | Admitting: Gastroenterology

## 2011-08-06 MED ORDER — OXYCODONE-ACETAMINOPHEN 10-325 MG PO TABS: 1.0000 | ORAL_TABLET | ORAL | Status: DC | PRN

## 2011-08-06 NOTE — Telephone Encounter (Signed)
Refill Oxycodone. Thanks. Would like to pick up today.

## 2011-08-06 NOTE — Telephone Encounter (Signed)
Ready for pick up

## 2011-08-09 ENCOUNTER — Inpatient Hospital Stay: Admission: RE | Admit: 2011-08-09 | Payer: 59 | Source: Ambulatory Visit

## 2011-08-16 ENCOUNTER — Other Ambulatory Visit: Payer: Self-pay | Admitting: Endocrinology

## 2011-08-16 DIAGNOSIS — E041 Nontoxic single thyroid nodule: Secondary | ICD-10-CM

## 2011-08-19 ENCOUNTER — Ambulatory Visit
Admission: RE | Admit: 2011-08-19 | Discharge: 2011-08-19 | Disposition: A | Discharge status: Home / Self Care | Payer: 59 | Source: Ambulatory Visit | Attending: Endocrinology | Admitting: Endocrinology

## 2011-08-19 DIAGNOSIS — E041 Nontoxic single thyroid nodule: Secondary | ICD-10-CM

## 2011-08-24 ENCOUNTER — Other Ambulatory Visit: Payer: 59

## 2011-08-26 ENCOUNTER — Other Ambulatory Visit: Payer: Self-pay | Admitting: Gastroenterology

## 2011-08-26 ENCOUNTER — Ambulatory Visit (HOSPITAL_COMMUNITY)
Admission: RE | Admit: 2011-08-26 | Discharge: 2011-08-26 | Disposition: A | Discharge status: Home / Self Care | Payer: 59 | Source: Ambulatory Visit | Attending: Gastroenterology | Admitting: Gastroenterology

## 2011-08-26 DIAGNOSIS — K219 Gastro-esophageal reflux disease without esophagitis: Secondary | ICD-10-CM | POA: Insufficient documentation

## 2011-08-26 DIAGNOSIS — F411 Generalized anxiety disorder: Secondary | ICD-10-CM | POA: Insufficient documentation

## 2011-08-26 DIAGNOSIS — Z79899 Other long term (current) drug therapy: Secondary | ICD-10-CM | POA: Insufficient documentation

## 2011-08-26 DIAGNOSIS — IMO0001 Reserved for inherently not codable concepts without codable children: Secondary | ICD-10-CM | POA: Insufficient documentation

## 2011-08-26 DIAGNOSIS — M069 Rheumatoid arthritis, unspecified: Secondary | ICD-10-CM | POA: Insufficient documentation

## 2011-08-26 DIAGNOSIS — R1310 Dysphagia, unspecified: Secondary | ICD-10-CM | POA: Insufficient documentation

## 2011-08-30 ENCOUNTER — Other Ambulatory Visit: Payer: Self-pay | Admitting: Internal Medicine

## 2011-08-30 MED ORDER — OXYCODONE-ACETAMINOPHEN 10-325 MG PO TABS: 1.0000 | ORAL_TABLET | ORAL | Status: DC | PRN

## 2011-08-30 NOTE — Telephone Encounter (Signed)
Script is ready for md to sign

## 2011-08-30 NOTE — Telephone Encounter (Signed)
Pt called req refill of oxyCODONE-acetaminophen (PERCOCET) 10-325 MG per tablet. Pls contact when ready for pick up.

## 2011-09-27 ENCOUNTER — Other Ambulatory Visit: Payer: Self-pay | Admitting: Internal Medicine

## 2011-09-27 MED ORDER — OXYCODONE-ACETAMINOPHEN 10-325 MG PO TABS: 1.0000 | ORAL_TABLET | ORAL | Status: DC | PRN

## 2011-09-27 NOTE — Telephone Encounter (Signed)
done

## 2011-09-27 NOTE — Telephone Encounter (Signed)
Refill Oxycodone. Thanks. °

## 2011-09-28 NOTE — Telephone Encounter (Signed)
Chart opened in error

## 2011-10-04 DIAGNOSIS — R12 Heartburn: Secondary | ICD-10-CM | POA: Insufficient documentation

## 2011-10-04 DIAGNOSIS — R1319 Other dysphagia: Secondary | ICD-10-CM | POA: Insufficient documentation

## 2011-10-12 ENCOUNTER — Telehealth: Payer: Self-pay | Admitting: Internal Medicine

## 2011-10-12 NOTE — Telephone Encounter (Signed)
Pt decline to see another doc requesting to see dr Lovell Sheehan only. Pt thinks she is dehydrated. Pt was inform to go to ER per bonnie. Pt is aware

## 2011-10-12 NOTE — Telephone Encounter (Signed)
Left message on machine May want to try [facility]- [address]---probably will not have to wait as long

## 2011-10-20 ENCOUNTER — Telehealth: Payer: Self-pay | Admitting: Internal Medicine

## 2011-10-20 NOTE — Telephone Encounter (Signed)
Useful as preventive and not so much for acute treatment.   Valtrex 1 gm 2 at onset of cold sores and 2 in 12 hours.  Disp #30 with 1 refill.

## 2011-10-20 NOTE — Telephone Encounter (Signed)
Please advise 

## 2011-10-20 NOTE — Telephone Encounter (Signed)
Needs new rx for Valtrex. 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 Abreva is not helping. [facility].

## 2011-10-21 ENCOUNTER — Telehealth: Payer: Self-pay | Admitting: Internal Medicine

## 2011-10-21 MED ORDER — VALACYCLOVIR HCL 1 G PO TABS: ORAL_TABLET | ORAL | Status: DC

## 2011-10-21 MED ORDER — OXYCODONE-ACETAMINOPHEN 10-325 MG PO TABS: 1.0000 | ORAL_TABLET | ORAL | Status: DC | PRN

## 2011-10-21 NOTE — Telephone Encounter (Signed)
Sent in

## 2011-10-21 NOTE — Telephone Encounter (Signed)
Pt requesting refills on her oxycodone. Please call when ready for pick up. Please also call the Valtrex in to her pharmacy.

## 2011-10-21 NOTE — Telephone Encounter (Signed)
Pt informed ready for pick up 

## 2011-10-27 ENCOUNTER — Encounter (HOSPITAL_COMMUNITY): Payer: Self-pay | Admitting: Emergency Medicine

## 2011-10-27 ENCOUNTER — Emergency Department (HOSPITAL_COMMUNITY)
Admission: EM | Admit: 2011-10-27 | Discharge: 2011-10-28 | Disposition: A | Discharge status: Home / Self Care | Payer: 59 | Attending: Emergency Medicine | Admitting: Emergency Medicine

## 2011-10-27 DIAGNOSIS — F17200 Nicotine dependence, unspecified, uncomplicated: Secondary | ICD-10-CM | POA: Insufficient documentation

## 2011-10-27 DIAGNOSIS — R0682 Tachypnea, not elsewhere classified: Secondary | ICD-10-CM | POA: Insufficient documentation

## 2011-10-27 DIAGNOSIS — R000 Tachycardia, unspecified: Secondary | ICD-10-CM | POA: Insufficient documentation

## 2011-10-27 DIAGNOSIS — K224 Dyskinesia of esophagus: Secondary | ICD-10-CM

## 2011-10-27 MED ORDER — SODIUM CHLORIDE 0.9 % IV BOLUS (SEPSIS)
1000.0000 mL | Freq: Once | INTRAVENOUS | Status: AC
  Administered 2011-10-27: 1000 mL via INTRAVENOUS

## 2011-10-27 MED ORDER — PANTOPRAZOLE SODIUM 40 MG IV SOLR
40.0000 mg | Freq: Once | INTRAVENOUS | Status: AC
Start: 2011-10-27 — End: 2011-10-27
  Administered 2011-10-27: 40 mg via INTRAVENOUS
  Filled 2011-10-27: qty 40

## 2011-10-27 MED ORDER — LORAZEPAM 2 MG/ML IJ SOLN
1.0000 mg | Freq: Once | INTRAMUSCULAR | Status: AC
  Administered 2011-10-28: 1 mg via INTRAVENOUS
  Filled 2011-10-27: qty 1

## 2011-10-27 MED ORDER — SODIUM CHLORIDE 0.9 % IV SOLN: Freq: Once | INTRAVENOUS | Status: DC

## 2011-10-27 MED ORDER — HYDROMORPHONE HCL PF 1 MG/ML IJ SOLN
1.0000 mg | Freq: Once | INTRAMUSCULAR | Status: AC
  Administered 2011-10-28: 1 mg via INTRAVENOUS
  Filled 2011-10-27: qty 1

## 2011-10-27 MED ORDER — PROMETHAZINE HCL 25 MG/ML IJ SOLN
25.0000 mg | Freq: Once | INTRAMUSCULAR | Status: AC
  Administered 2011-10-28: 25 mg via INTRAVENOUS
  Filled 2011-10-27 (×3): qty 1

## 2011-10-27 NOTE — ED Provider Notes (Signed)
History     CSN: [id number]  Arrival date & time 10/27/11  2212   First MD Initiated Contact with Patient 10/27/11 2312      Chief Complaint  Patient presents with  . Esopheal spasms    (Consider location/radiation/quality/duration/timing/severity/associated sxs/prior treatment) The history is provided by the patient.   35 year old female with a history of esophageal spasm and esophageal stricture has had increasing problems today. She describes a sense that her esophagus is sticking together in her throat which is causing problems with the difficulty breathing and difficulty swallowing. She also has associated abdominal pain and vomiting. She is scheduled to see a gastroenterologist at [facility], because in the past, esophageal dilatation and only had a very short term beneficial effect. She usually benefits from IV fluids, Dilantin, and Phenergan when she has a flareup like this. She rates the pain as severe as 9/10. Nothing makes it better, nothing makes it worse. Note is made of EMS documenting stridor at home and difficulty breathing before arriving in the emergency department. Past Medical History  Diagnosis Date  . ABDOMINAL WALL HERNIA 02/27/2010  . ALLERGIC RHINITIS 06/02/2007  . ANOREXIA, CHRONIC 08/07/2008  . ANXIETY 09/15/2010  . BACK PAIN, THORACIC REGION 07/07/2007  . BACK PAIN, THORACIC REGION 07/07/2007  . CERVICAL RADICULOPATHY 12/11/2008  . Condyloma acuminatum 04/23/2009  . CONSTIPATION 09/15/2010  . DYSPHAGIA UNSPECIFIED 09/09/2009  . 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 ENDOMETRIOSIS 12/16/2009  . FATIGUE 06/11/2010  . GERD 10/23/2009  . HERPES SIMPLEX INFECTION 06/11/2010  . LENTIGO 04/23/2009  . LUNG NODULE 06/17/2010  . Palpitations 10/23/2009  . 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 Panic disorder without agoraphobia 08/07/2008  . Stricture and stenosis of esophagus 05/13/2010  . TOBACCO ABUSE 08/07/2008  . Heart palpitations   . Urinary tract  infection   . Fibromyalgia   . Rheumatoid arthritis   . Ovarian cyst   . Abnormal Pap smear     Past Surgical History  Procedure Date  . Partial hysterectomy   . Endometrial ablation   . Hernia repair     X3  . Dilation and curettage of uterus   . Cesarean section     Family History  Problem Relation Age of Onset  . Depression Mother   . Arthritis Mother   . Hyperlipidemia Father   . Hypertension Father     History  Substance Use Topics  . Smoking status: Current Everyday Smoker -- 1.0 packs/day    Types: Cigarettes  . Smokeless tobacco: Never Used  . Alcohol Use: No    OB History    Grav Para Term Preterm Abortions TAB SAB Ect Mult Living   3 2 2  0 1 0 1 0 0 2      Review of Systems  All other systems reviewed and are negative.    Allergies  Ondansetron hcl; Chantix; Lamictal; Nicotine; Skelaxin; and Penicillins  Home Medications   Current Outpatient Rx  Name Route Sig Dispense Refill  . ALPRAZOLAM 0.5 MG PO TABS Oral Take 0.5 mg by mouth every 3 (three) hours as needed. For anxiety.    . ESCITALOPRAM OXALATE 20 MG PO TABS Oral Take 10 mg by mouth daily.      . OXYCODONE-ACETAMINOPHEN 10-325 MG PO TABS Oral Take 1 tablet by mouth every 4 (four) hours as needed. pain 100 tablet 0  . PSEUDOEPHEDRINE HCL 30 MG/5ML PO SYRP Oral Take 60 mg by mouth every 4 (four) hours as needed. For allergies/stuffiness.     Marland Kitchen  FLEET ENEMA 7-19 GM/118ML RE ENEM Rectal Place 1 enema rectally as needed. For constipation    . VALACYCLOVIR HCL 1 G PO TABS  2 at onset of cold sore and 2 in 12 hours 30 tablet 1    BP 125/86  Pulse 105  Temp(Src) 100 F (37.8 C) (Oral)  Resp 22  SpO2 97%  Physical Exam  Nursing note and vitals reviewed.  36 year old female who appears somewhat anxious but is in no acute distress. Vital signs are significant for tachycardia with heart rate of 105 and tachypnea with respiratory rate of 22. Oxygen saturation is 97% which is normal. Head is  normocephalic and atraumatic. PERRLA, EOMI. Oropharynx is clear. She is not having any difficulty managing her secretions. There is no drooling. Phonation is normal. Neck is supple without adenopathy, JVD, or stridor. Lungs are clear without rales, wheezes, rhonchi. Back is nontender. Heart has regular rate and rhythm without murmur. Abdomen is soft, mildly distended, with moderate tenderness in the epigastrium and left upper and lower cartilage. There is no rebound or guarding. Peristalsis is diminished. Extremities have no cyanosis or edema, full range of motion is present. Skin is warm and moist without rash. Neurologic: Mental status is normal, cranial nerves are intact, there no focal motor or sensory deficits. ED Course  Procedures (including critical care time)   Labs Reviewed  CBC  DIFFERENTIAL  BASIC METABOLIC PANEL   No results found. Results for orders placed during the hospital encounter of 10/27/11  CBC      Component Value Range   WBC 4.9  4.0 - 10.5 (K/uL)   RBC 4.40  3.87 - 5.11 (MIL/uL)   Hemoglobin 14.1  12.0 - 15.0 (g/dL)   HCT 16.1  09.6 - 04.5 (%)   MCV 90.5  78.0 - 100.0 (fL)   MCH 32.0  26.0 - 34.0 (pg)   MCHC 35.4  30.0 - 36.0 (g/dL)   RDW 40.9  81.1 - 91.4 (%)   Platelets 126 (*) 150 - 400 (K/uL)  DIFFERENTIAL      Component Value Range   Neutrophils Relative 52  43 - 77 (%)   Neutro Abs 2.5  1.7 - 7.7 (K/uL)   Lymphocytes Relative 30  12 - 46 (%)   Lymphs Abs 1.5  0.7 - 4.0 (K/uL)   Monocytes Relative 14 (*) 3 - 12 (%)   Monocytes Absolute 0.7  0.1 - 1.0 (K/uL)   Eosinophils Relative 4  0 - 5 (%)   Eosinophils Absolute 0.2  0.0 - 0.7 (K/uL)   Basophils Relative 0  0 - 1 (%)   Basophils Absolute 0.0  0.0 - 0.1 (K/uL)  BASIC METABOLIC PANEL      Component Value Range   Sodium 133 (*) 135 - 145 (mEq/L)   Potassium 3.8  3.5 - 5.1 (mEq/L)   Chloride 101  96 - 112 (mEq/L)   CO2 23  19 - 32 (mEq/L)   Glucose, Bld 92  70 - 99 (mg/dL)   BUN 7  6 - 23 (mg/dL)    Creatinine, Ser 7.82 (*) 0.50 - 1.10 (mg/dL)   Calcium 8.9  8.4 - 95.6 (mg/dL)   GFR calc non Af Amer >90  >90 (mL/min)   GFR calc Af Amer >90  >90 (mL/min)   No results found.    No diagnosis found.  0150: After IV fluids, IV Dilaudid, IV Ativan, IV Protonix, and IM Phenergan, she is feeling considerably better and feels like she  is well must go home. Her outpatient workup is ordered he is scheduled, and she is advised to followup with her gastroenterologist in [place].  MDM  Esophageal spasm with esophageal dysmotility disorder.        Dione Booze, MD 10/28/11 [phone]

## 2011-10-27 NOTE — ED Notes (Signed)
History of esophageal spasms. Pt has abdominal pain and distension. Pt has had mesh placed in abd from prior abd surgeries. Pt thinks that mesh has torn. Pt has had difficulty breathing per EMS. Pt stridorous upon EMS arrival. Pt breathing normally now per EMS.

## 2011-10-27 NOTE — ED Notes (Signed)
MD at bedside. 

## 2011-10-28 LAB — DIFFERENTIAL
Basophils Absolute: 0 10*3/uL (ref 0.0–0.1)
Lymphocytes Relative: 30 % (ref 12–46)
Neutro Abs: 2.5 10*3/uL (ref 1.7–7.7)

## 2011-10-28 LAB — BASIC METABOLIC PANEL
CO2: 23 mEq/L (ref 19–32)
Chloride: 101 mEq/L (ref 96–112)
Potassium: 3.8 mEq/L (ref 3.5–5.1)
Sodium: 133 mEq/L — ABNORMAL LOW (ref 135–145)

## 2011-10-28 LAB — CBC
Platelets: 126 10*3/uL — ABNORMAL LOW (ref 150–400)
RBC: 4.4 MIL/uL (ref 3.87–5.11)
RDW: 12.1 % (ref 11.5–15.5)
WBC: 4.9 10*3/uL (ref 4.0–10.5)

## 2011-10-28 NOTE — ED Notes (Signed)
D/c instructions reviewed w/ pt and family - pt and family deny any further questions or concerns at present.\ 

## 2011-11-19 ENCOUNTER — Other Ambulatory Visit: Payer: Self-pay | Admitting: Internal Medicine

## 2011-11-19 MED ORDER — OXYCODONE-ACETAMINOPHEN 10-325 MG PO TABS: 1.0000 | ORAL_TABLET | ORAL | Status: DC | PRN

## 2011-11-19 NOTE — Telephone Encounter (Signed)
She gets it every 20 days bonnye keeps tract

## 2011-11-19 NOTE — Telephone Encounter (Signed)
rx is ready for pick up and patient is aware 

## 2011-11-19 NOTE — Telephone Encounter (Signed)
Pt called req refill of oxyCODONE-acetaminophen (PERCOCET) 10-325 MG per tablet  ° °

## 2011-12-02 ENCOUNTER — Other Ambulatory Visit: Payer: Self-pay | Admitting: *Deleted

## 2011-12-02 ENCOUNTER — Telehealth: Payer: Self-pay | Admitting: Internal Medicine

## 2011-12-02 MED ORDER — OXYCODONE-ACETAMINOPHEN 10-325 MG PO TABS: 1.0000 | ORAL_TABLET | ORAL | Status: DC | PRN

## 2011-12-02 NOTE — Telephone Encounter (Signed)
Dr Sharene Skeans is pediatric neurologist. Pt doesn't know what to do right now

## 2011-12-02 NOTE — Telephone Encounter (Signed)
LMTCB

## 2011-12-02 NOTE — Telephone Encounter (Signed)
Pt would like to see Dr. Sharene Skeans if that is ok with Dr. Lovell Sheehan.

## 2011-12-02 NOTE — Telephone Encounter (Signed)
Refill Oxycodone. Thanks. Wants to be worked in with Dr Lovell Sheehan. Went to [facility]. Was told that she may have possible MS. Still having swallowing issues. Please advise and return her call of where to work her in?

## 2011-12-02 NOTE — Telephone Encounter (Signed)
Per dr Lovell Sheehan- if there is a possibility of ms dr Lovell Sheehan would like you to see a neurologist at [facility]-- please tell pt that percocet will be ready in am

## 2011-12-02 NOTE — Telephone Encounter (Signed)
He only see children- dr Lovell Sheehan prefers [facility] because that where gi is that thinks maybe ms

## 2011-12-02 NOTE — Telephone Encounter (Signed)
Too early for refil on percocet

## 2011-12-03 ENCOUNTER — Telehealth: Payer: Self-pay | Admitting: Internal Medicine

## 2011-12-03 NOTE — Telephone Encounter (Signed)
Pt would like a referral to [facility] neurologist ?MS. Pt is requesting ov before end of feb also MRI and Spine tap. Pt deductible starts over end of feb 2013

## 2011-12-06 ENCOUNTER — Other Ambulatory Visit: Payer: Self-pay | Admitting: *Deleted

## 2011-12-06 ENCOUNTER — Other Ambulatory Visit: Payer: Self-pay | Admitting: Internal Medicine

## 2011-12-06 DIAGNOSIS — G35 Multiple sclerosis: Secondary | ICD-10-CM

## 2011-12-06 NOTE — Telephone Encounter (Signed)
Gina [name] will talk to pt about possible appointment with dr Modesto Charon to expedite appointment

## 2011-12-06 NOTE — Telephone Encounter (Signed)
Referral sent to terri and pt informed- Left message on machine for terri that pt will have to meet deductable  Again starting in February. So maybe asap referral

## 2011-12-07 ENCOUNTER — Ambulatory Visit (INDEPENDENT_AMBULATORY_CARE_PROVIDER_SITE_OTHER): Payer: 59 | Admitting: Neurology

## 2011-12-07 ENCOUNTER — Other Ambulatory Visit (INDEPENDENT_AMBULATORY_CARE_PROVIDER_SITE_OTHER): Payer: 59

## 2011-12-07 ENCOUNTER — Telehealth: Payer: Self-pay | Admitting: Neurology

## 2011-12-07 ENCOUNTER — Encounter: Payer: Self-pay | Admitting: Neurology

## 2011-12-07 DIAGNOSIS — R1310 Dysphagia, unspecified: Secondary | ICD-10-CM

## 2011-12-07 NOTE — Progress Notes (Signed)
Dear Dr. Lovell Sheehan,  Thank you for having me see Gina [name] in consultation today at [facility] for her problem with  difficulty swallowing.  As you may recall, she is a 36 y.o. year old female with a history of severe anxiety, depression, stricture and stenosis of the esophagus who has had a history of difficulty swallowing since 2009. It started with mostly difficulty with solids. She felt that things get stuck into back of her throat. This progressed into then had difficulty with liquids. She eventually sought consultation with Dr. Marina Goodell here in gastroenterology. He felt she had Schatzki's rings and did do an esophageal dilatation. This helped her temporarily but then she required multiple other palpitations. She was last dilated in June 2012. During this time and she sought consultation with Dr. Dulce Sellar at [facility] gastroenterology. He was uncertain about the diagnosis of Schatzki's rings. It sounds like-he dilated her in June but been felt that he couldn't offer anything more for her swallowing problems. During this time she did have an esophageal barium swallow that did show esophageal stenosis.  She was sent him to Dr. Adriana Simas at [facility] and had a number of investigations the details of which are not available to [place]. Apparently he has other investigations planned for her swallowing difficulty. A diagnosis of multiple sclerosis was entertained.  The patient has not suffered for many dysarthria, vertigo, double vision transient loss of vision. She does get problems on the "left side of her body". She gets what sounds like radicular pain in her left leg. Sometimes her left leg will give out on her. She had problems with chronic abdominal pain that has been attributed to endometriosis.  The patient clearly has a history of severe anxiety. In the past she's been told that her swallowing difficulties may be from anxiety.  Medical History:  The patient has a history of anxiety, panic attacks,  fibromyalgia, "rheumatoid arthritis"-I don't have any records of this diagnosis was made. Anorexia nervosa. Past Surgical History  Procedure Date  . Partial hysterectomy   . Endometrial ablation   . Hernia repair     X3  . Dilation and curettage of uterus   . Cesarean section    History   Social History  . Marital Status: Married    Spouse Name: N/A    Number of Children: N/A  . Years of Education: N/A   Social History Main Topics  . Smoking status: Current Everyday Smoker -- 1.0 packs/day    Types: Cigarettes  . Smokeless tobacco: Never Used  . Alcohol Use: No  . Drug Use: No  . Sexually Active: Yes    Birth Control/ Protection: Surgical   Other Topics Concern  . None   Social History Narrative  . None   Family History  Problem Relation Age of Onset  . Depression Mother   . Arthritis Mother   . Hyperlipidemia Father   . Hypertension Father    Current Outpatient Prescriptions on File Prior to Visit  Medication Sig Dispense Refill  . ALPRAZolam (XANAX) 0.5 MG tablet Take 0.5 mg by mouth every 3 (three) hours as needed. For anxiety.      Marland Kitchen escitalopram (LEXAPRO) 20 MG tablet Take 10 mg by mouth daily.        . pseudoephedrine (SUDAFED) 30 MG/5ML syrup Take 60 mg by mouth every 4 (four) hours as needed. For allergies/stuffiness.       . sodium phosphate (FLEET) 7-19 GM/118ML ENEM Place 1 enema rectally as  needed. For constipation      . valACYclovir (VALTREX) 1000 MG tablet 2 at onset of cold sore and 2 in 12 hours  30 tablet  1     Allergies  Allergen Reactions  . Ondansetron Hcl Shortness Of Breath    Throat swelled   . Zofran Swelling    Throat closed up  . Chantix (Varenicline Tartrate)     REACTION: "went crazy"  . Lamictal (Lamotrigine)     [facility] rash  . Nicotine     Patches caused palpations lozenges throat swelled  . Skelaxin     "don't remember but I know I can't take it"  . Penicillins Rash      Review of systems:  13 systems were  reviewed and are notable for anxiety, depression, left sided pain.  All other review of systems are unremarkable.   Examination:  Filed Vitals:   12/07/11 0838  BP: 112/72  Pulse: 96  Height: 5\' 7"  (1.702 m)     In general, women in NAD.  Cardiovascular: The patient has a regular rate and rhythm and no carotid bruits.  Fundoscopy:  Disks are flat. Vessel caliber within normal limits.  Mental status:   The patient is oriented to person, place and time. Recent and remote memory are intact. Attention span and concentration are normal. Language including repetition, naming, following commands are intact. Fund of knowledge of current and historical events, as well as vocabulary are normal.  Cranial Nerves: Pupils are equally round and reactive to light. Visual fields full to confrontation. Extraocular movements are intact without nystagmus. Facial sensation and muscles of mastication are intact. Muscles of facial expression are symmetric. Hearing intact to bilateral finger rub. Tongue protrusion, uvula, palate midline.  Shoulder shrug intact.  Normal gag.  Motor:  The patient has normal bulk and tone, no pronator drift.  There are no adventitious movements.  5/5 muscle strength bilaterally.  Reflexes:   Biceps  Triceps Brachioradialis Knee Ankle  Right 2+  2+  2+   2+ 2+  Left  2+  2+  2+   2+ 2+  Toes down  Coordination:  Normal finger to nose.  No dysdiadokinesia.  Sensation is intact to temperature and vibration.  Gait and Station are normal.  Tandem gait is intact.  Romberg is negative  Impression and Recommendations: I think it is a very low likelihood that [name] has a neurologic cause of her difficulty swallowing. When one thinks of difficulty swallowing diagnoses such as myasthenia gravis, motor neuron disease, multiple sclerosis involving the brainstem and ischemic stroke come to mind. However these are almost always accompanied by other manifestations. Of course  motor neuron disease is progressive and highly unlikely. Multiple sclerosis associated with relapses as well as multifocal neurologic disease which is not consistent with her history or physical examination. Ischemic stroke is also very unlikely because of the waxing and waning quality of her symptoms. I am going to get a MRI of her brain with and without contrast with thin cuts through the brainstem. This will effectively rule out multiple sclerosis. I will also get a acetylcholine receptor binding and modulating antibodies as well as muscle specific kinase antibodies. Most importantly I need the records from [facility] gastroenterology. 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  We will see the patient back in 2 months.  Thank you for having [place] see TRENISE TURAY in consultation.  Feel free to contact  me with any questions.  Lupita Raider Modesto Charon, MD [facility], [place] [address] [place], [place] [address] Phone: [phone] Fax: [phone].

## 2011-12-07 NOTE — Patient Instructions (Addendum)
Go to the basement to have your labs drawn today.  Your next appointment with Dr. Modesto Charon is scheduled on April 5th at 9:30 AM.  Your MRI is scheduled at the [facility] right beside [facility]. The address is [address] [place]. Your appointment is Saturday, February 9th at 10:00 am. Please arrive 15 minutes prior to your scheduled appointment.   [phone].

## 2011-12-07 NOTE — Telephone Encounter (Signed)
Called and spoke with the patient. Aware of her MRI appointment at [facility] on 02/07 at 12:00 noon under conscious sedation. She reports that the nurse from radiology has called her as well. No other issues at this time.

## 2011-12-09 ENCOUNTER — Ambulatory Visit (HOSPITAL_COMMUNITY)
Admission: RE | Admit: 2011-12-09 | Discharge: 2011-12-09 | Disposition: A | Discharge status: Home / Self Care | Payer: 59 | Source: Ambulatory Visit | Attending: Neurology | Admitting: Neurology

## 2011-12-09 ENCOUNTER — Encounter (HOSPITAL_COMMUNITY): Payer: Self-pay

## 2011-12-09 DIAGNOSIS — J0100 Acute maxillary sinusitis, unspecified: Secondary | ICD-10-CM | POA: Insufficient documentation

## 2011-12-09 DIAGNOSIS — R1310 Dysphagia, unspecified: Secondary | ICD-10-CM | POA: Insufficient documentation

## 2011-12-09 LAB — ACETYLCHOLINE RECEPTOR, BINDING: A CHR BINDING ABS: <0.3 nmol/L (ref <=0.30)

## 2011-12-09 MED ORDER — SODIUM CHLORIDE 0.9 % IV SOLN: INTRAVENOUS | Status: DC

## 2011-12-09 MED ORDER — FENTANYL CITRATE 0.05 MG/ML IJ SOLN
INTRAMUSCULAR | Status: AC | PRN
  Administered 2011-12-09: 50 ug via INTRAVENOUS
  Administered 2011-12-09: 25 ug via INTRAVENOUS

## 2011-12-09 MED ORDER — FENTANYL CITRATE 0.05 MG/ML IJ SOLN
25.0000 ug | INTRAMUSCULAR | Status: DC | PRN
  Filled 2011-12-09: qty 4

## 2011-12-09 MED ORDER — MIDAZOLAM HCL 2 MG/2ML IJ SOLN
INTRAMUSCULAR | Status: AC
  Filled 2011-12-09: qty 10

## 2011-12-09 MED ORDER — PROMETHAZINE HCL 25 MG/ML IJ SOLN
12.5000 mg | Freq: Once | INTRAMUSCULAR | Status: AC
  Administered 2011-12-09: 12.5 mg via INTRAVENOUS
  Filled 2011-12-09: qty 1

## 2011-12-09 MED ORDER — LORAZEPAM 2 MG/ML IJ SOLN
INTRAMUSCULAR | Status: AC | PRN
  Administered 2011-12-09: 0.5 mg via INTRAVENOUS

## 2011-12-09 MED ORDER — FENTANYL CITRATE 0.05 MG/ML IJ SOLN
INTRAMUSCULAR | Status: AC
  Filled 2011-12-09: qty 4

## 2011-12-09 MED ORDER — LORAZEPAM 2 MG/ML IJ SOLN
INTRAMUSCULAR | Status: AC
  Filled 2011-12-09: qty 1

## 2011-12-09 MED ORDER — MIDAZOLAM HCL 5 MG/5ML IJ SOLN
INTRAMUSCULAR | Status: AC | PRN
  Administered 2011-12-09: 2 mg via INTRAVENOUS
  Administered 2011-12-09: 1 mg via INTRAVENOUS
  Administered 2011-12-09: 2 mg via INTRAVENOUS
  Administered 2011-12-09 (×2): 1 mg via INTRAVENOUS

## 2011-12-09 MED ORDER — GADOBENATE DIMEGLUMINE 529 MG/ML IV SOLN
20.0000 mL | Freq: Once | INTRAVENOUS | Status: AC | PRN
  Administered 2011-12-09: 15 mL via INTRAVENOUS

## 2011-12-09 MED ORDER — LORAZEPAM 2 MG/ML IJ SOLN
0.5000 mg | Freq: Once | INTRAMUSCULAR | Status: AC
  Administered 2011-12-09: 0.5 mg via INTRAVENOUS
  Filled 2011-12-09: qty 0.25

## 2011-12-09 MED ORDER — MIDAZOLAM HCL 2 MG/2ML IJ SOLN
1.0000 mg | INTRAMUSCULAR | Status: DC | PRN
  Filled 2011-12-09: qty 10

## 2011-12-09 MED ORDER — PROMETHAZINE HCL 25 MG/ML IJ SOLN
INTRAMUSCULAR | Status: AC
  Filled 2011-12-09: qty 1

## 2011-12-09 NOTE — ED Notes (Signed)
Discharge instructions reviewed with patient and her father.  Pt is not allowed to drive or make any legal decisions for the remainder of the day

## 2011-12-09 NOTE — H&P (Signed)
  Patient presents today for MRI examination of her brain with moderate sedation.  She denies any significant chest pain or SOB.  She has tolerated these meds in the past.  Claustrophobia is her reason for sedation today.  Spoke at length with her given her lengthy and long term medication use. She is concerned with being under-medicated for the procedure to make it through.  Spoke with her at length in regards to the sedation type we will use today and the need for her to allow the medications to help. She appears to agree with this and has less anxiety since starting our conversation.  Lungs today on examination are CTA bilaterally, heart examination is RRR with no murmurs rubs or gallops.  Airway is a 2. Written consent obtained after our discussion to proceed.

## 2011-12-09 NOTE — ED Notes (Signed)
Pt's care released to her father.  Pt denies any problems or pain at discharge

## 2011-12-11 ENCOUNTER — Other Ambulatory Visit (HOSPITAL_COMMUNITY): Payer: 59

## 2011-12-14 ENCOUNTER — Telehealth: Payer: Self-pay | Admitting: Neurology

## 2011-12-14 NOTE — Telephone Encounter (Signed)
Message copied by Benay Spice on Tue Dec 14, 2011 11:03 AM ------      Message from: Milas Gain      Created: Tue Dec 14, 2011 12:11 AM       please let Gina [name] know that her MRI brain did not show any sign of MS or any other cause of her swallowing difficulties.

## 2011-12-14 NOTE — Telephone Encounter (Signed)
The patient returned my call. Information given as directed by Dr. Modesto Charon below re: MRI results. No other concerns voiced at this time.

## 2011-12-15 LAB — ACETYLCHOLINE RECEPTOR, MODULATING: Acetylchol Modul Ab: 5 %

## 2011-12-20 ENCOUNTER — Telehealth: Payer: Self-pay | Admitting: Neurology

## 2011-12-20 NOTE — Telephone Encounter (Signed)
Called and spoke with the pt. Info given as per Dr. Modesto Charon. No additional concerns/questions at this time.

## 2011-12-20 NOTE — Telephone Encounter (Signed)
Message copied by Benay Spice on Mon Dec 20, 2011  2:15 PM ------      Message from: Milas Gain      Created: Mon Dec 20, 2011  8:49 AM       please let Ms. Westcott know that her blood tests for myasthenia gravis - which sometimes can cause swallowing problems - was negative.

## 2011-12-21 ENCOUNTER — Telehealth: Payer: Self-pay | Admitting: *Deleted

## 2011-12-21 DIAGNOSIS — G8929 Other chronic pain: Secondary | ICD-10-CM | POA: Insufficient documentation

## 2011-12-21 MED ORDER — OXYCODONE-ACETAMINOPHEN 10-325 MG PO TABS: 1.0000 | ORAL_TABLET | ORAL | Status: DC | PRN

## 2011-12-21 NOTE — Telephone Encounter (Signed)
Ready for pick up

## 2011-12-21 NOTE — Telephone Encounter (Signed)
Pt requesting refill of Oxycodone 10-325.  Please advise when ready to pick up.

## 2011-12-28 NOTE — Progress Notes (Signed)
Got MUSK ab's.  There were negative.

## 2012-01-03 NOTE — H&P (Signed)
Gina [name] is an 36 y.o. female.   Chief Complaint: dysphagia. HPI: patient presents today for MRI with moderate sedation secondary to severe claustrophobia.   Past Medical History  Diagnosis Date  . ABDOMINAL WALL HERNIA 02/27/2010  . ALLERGIC RHINITIS 06/02/2007  . ANOREXIA, CHRONIC 08/07/2008  . ANXIETY 09/15/2010  . BACK PAIN, THORACIC REGION 07/07/2007  . BACK PAIN, THORACIC REGION 07/07/2007  . CERVICAL RADICULOPATHY 12/11/2008  . Condyloma acuminatum 04/23/2009  . CONSTIPATION 09/15/2010  . DYSPHAGIA UNSPECIFIED 09/09/2009  . 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 ENDOMETRIOSIS 12/16/2009  . FATIGUE 06/11/2010  . GERD 10/23/2009  . HERPES SIMPLEX INFECTION 06/11/2010  . LENTIGO 04/23/2009  . LUNG NODULE 06/17/2010  . Palpitations 10/23/2009  . 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 Panic disorder without agoraphobia 08/07/2008  . Stricture and stenosis of esophagus 05/13/2010  . TOBACCO ABUSE 08/07/2008  . Heart palpitations   . Urinary tract infection   . Fibromyalgia   . Rheumatoid arthritis   . Ovarian cyst   . Abnormal Pap smear     Past Surgical History  Procedure Date  . Partial hysterectomy   . Endometrial ablation   . Hernia repair     X3  . Dilation and curettage of uterus     x1   . Cesarean section     x2   . Esophageal dilatation x 4   . Gum surgery     Family History  Problem Relation Age of Onset  . Depression Mother   . Arthritis Mother   . Hyperlipidemia Father   . Hypertension Father    Social History:  reports that she has been smoking Cigarettes.  She has been smoking about 1 pack per day. She has never used smokeless tobacco. She reports that she does not drink alcohol or use illicit drugs.  Allergies:  Allergies  Allergen Reactions  . Ondansetron Hcl Shortness Of Breath    Throat swelled   . Zofran Swelling    Throat closed up  . Chantix (Varenicline Tartrate)     REACTION: "went crazy"  . Lamictal (Lamotrigine)     [facility] rash    . Nicotine     Patches caused palpations lozenges throat swelled  . Skelaxin     "don't remember but I know I can't take it"  . Penicillins Rash    Medications Prior to Admission  Medication Sig Dispense Refill  . ALPRAZolam (XANAX) 0.5 MG tablet Take 0.5 mg by mouth every 3 (three) hours as needed. For anxiety.      Marland Kitchen escitalopram (LEXAPRO) 20 MG tablet Take 10 mg by mouth daily.        . pseudoephedrine (SUDAFED) 30 MG/5ML syrup Take 60 mg by mouth every 4 (four) hours as needed. For allergies/stuffiness.       . sodium phosphate (FLEET) 7-19 GM/118ML ENEM Place 1 enema rectally as needed. For constipation      . valACYclovir (VALTREX) 1000 MG tablet 2 at onset of cold sore and 2 in 12 hours  30 tablet  1   Medications Prior to Admission  Medication Dose Route Frequency Provider Last Rate Last Dose  . fentaNYL (SUBLIMAZE) injection   Intravenous PRN Elsie Stain, MD   50 mcg at 12/09/11 1401  . gadobenate dimeglumine (MULTIHANCE) injection 20 mL  20 mL Intravenous Once PRN Medication Radiologist, MD   15 mL at 12/09/11 1458  . LORazepam (ATIVAN) injection 0.5 mg  0.5 mg Intravenous Once [place] [facility]  Orvan Falconer, PA   0.5 mg at 12/09/11 1317  . LORazepam (ATIVAN) injection   Intravenous PRN Elsie Stain, MD   0.5 mg at 12/09/11 1410  . midazolam (VERSED) 5 MG/5ML injection   Intravenous PRN Elsie Stain, MD   1 mg at 12/09/11 1428  . promethazine (PHENERGAN) injection 12.5 mg  12.5 mg Intravenous Once Anselm Pancoast, PA   12.5 mg at 12/09/11 1319  . DISCONTD: 0.9 %  sodium chloride infusion   Intravenous Continuous Elsie Stain, MD      . DISCONTD: fentaNYL (SUBLIMAZE) 0.05 MG/ML injection           . DISCONTD: fentaNYL (SUBLIMAZE) injection 25-200 mcg  25-200 mcg Intravenous Q5 min PRN Elsie Stain, MD      . DISCONTD: LORazepam (ATIVAN) 2 MG/ML injection           . DISCONTD: midazolam (VERSED) 2 MG/2ML injection           . DISCONTD: midazolam (VERSED) injection 1-10 mg  1-10 mg  Intravenous Q5 min PRN Elsie Stain, MD      . DISCONTD: promethazine (PHENERGAN) 25 MG/ML injection             No results found for this or any previous visit (from the past 48 hour(s)). No results found.  Review of Systems  Constitutional: Positive for malaise/fatigue. Negative for fever and chills.  HENT: Negative.   Respiratory: Negative for cough, hemoptysis and shortness of breath.   Cardiovascular: Positive for palpitations. Negative for chest pain and claudication.  Gastrointestinal: Positive for heartburn, nausea and abdominal pain. Negative for blood in stool.  Musculoskeletal: Positive for myalgias, back pain and joint pain.  Skin: Negative.   Neurological: Negative for seizures and loss of consciousness.  Endo/Heme/Allergies: Negative.   Psychiatric/Behavioral: Positive for depression. Negative for memory loss. The patient is nervous/anxious and has insomnia.     Blood pressure 99/58, pulse 72, temperature 98.6 F (37 C), temperature source Oral, resp. rate 16, SpO2 100.00%. Physical Exam  Constitutional: She is oriented to person, place, and time. She appears well-developed and well-nourished.  HENT:  Head: Normocephalic and atraumatic.  Neck: Normal range of motion.  Cardiovascular: Normal rate, regular rhythm and normal heart sounds.  Exam reveals no gallop and no friction rub.   No murmur heard. Respiratory: Effort normal and breath sounds normal. No respiratory distress. She has no wheezes. She has no rales. She exhibits no tenderness.  Musculoskeletal: Normal range of motion. She exhibits no edema.  Neurological: She is alert and oriented to person, place, and time.  Skin: Skin is warm and dry.  Psychiatric: She has a normal mood and affect. Judgment and thought content normal.       Anxious but appropriate      Assessment/Plan Discussed at length moderate sedation usage for severe claustrophobia for MRI scan.  Patient has been made aware of the benefits as  well as the potential risks of cardiac and respiratory compromise with moderate sedation. She verbalized her understanding and written consent obtained.   Jolan Mealor D 01/03/2012, 3:55 PM

## 2012-01-07 ENCOUNTER — Telehealth: Payer: Self-pay | Admitting: Internal Medicine

## 2012-01-07 MED ORDER — OXYCODONE-ACETAMINOPHEN 10-325 MG PO TABS: 1.0000 | ORAL_TABLET | ORAL | Status: DC | PRN

## 2012-01-07 NOTE — Telephone Encounter (Signed)
Pt would like new rx oxycodone 10-325mg . Pt is aware not due until 01-18-2012

## 2012-01-07 NOTE — Telephone Encounter (Signed)
Refill is due 3-10 which is Sunday- gave script 2 days early since this is the weekend

## 2012-02-03 ENCOUNTER — Other Ambulatory Visit: Payer: Self-pay | Admitting: Internal Medicine

## 2012-02-03 MED ORDER — OXYCODONE-ACETAMINOPHEN 10-325 MG PO TABS: 1.0000 | ORAL_TABLET | ORAL | Status: DC | PRN

## 2012-02-03 NOTE — Telephone Encounter (Signed)
Pt need written rx oxycodone. Pt will send family member to pick up rx. Pt is out of town.

## 2012-02-03 NOTE — Telephone Encounter (Signed)
Med printed and will call pt when ready for piock up

## 2012-02-04 ENCOUNTER — Ambulatory Visit: Payer: 59 | Admitting: Neurology

## 2012-02-15 ENCOUNTER — Telehealth: Payer: Self-pay | Admitting: Internal Medicine

## 2012-02-15 NOTE — Telephone Encounter (Signed)
Per dr Lovell Sheehan- he is unavaiable to call pts. Please ask if we can help

## 2012-02-15 NOTE — Telephone Encounter (Signed)
LMTCB

## 2012-02-15 NOTE — Telephone Encounter (Signed)
Pt called requesting to talk to Dr. Lovell Sheehan Directly does NOT want to talk to anyone else she is in [place] with her daughter who is receiving treatment and admit herself to detox.

## 2012-03-08 ENCOUNTER — Telehealth: Payer: Self-pay | Admitting: Internal Medicine

## 2012-03-08 NOTE — Telephone Encounter (Signed)
Per dr Clemencia Course mom requested xanax refilled in [facility]-- ok per dr Lovell Sheehan- ,lmom For pt to leave pharmacy name and number

## 2012-03-08 NOTE — Telephone Encounter (Signed)
Pt called and is in [place]. Pt req a call back from Debbie re: a situation that Dr Lovell Sheehan has been made aware of. Pls call.

## 2012-03-10 ENCOUNTER — Telehealth: Payer: Self-pay | Admitting: *Deleted

## 2012-03-10 MED ORDER — CLONAZEPAM 1 MG PO TABS: 1.0000 mg | ORAL_TABLET | Freq: Two times a day (BID) | ORAL | Status: DC

## 2012-03-10 MED ORDER — CYCLOBENZAPRINE HCL 10 MG PO TABS: 10.0000 mg | ORAL_TABLET | Freq: Three times a day (TID) | ORAL | Status: AC | PRN

## 2012-03-10 NOTE — Telephone Encounter (Signed)
Left message on machine.

## 2012-03-10 NOTE — Telephone Encounter (Signed)
Requesting refills--to [facility] in [place] [phone]--

## 2012-03-10 NOTE — Telephone Encounter (Signed)
Pt informed

## 2012-03-29 ENCOUNTER — Other Ambulatory Visit: Payer: Self-pay | Admitting: Internal Medicine

## 2012-03-29 MED ORDER — TRAMADOL HCL 50 MG PO TABS: 50.0000 mg | ORAL_TABLET | Freq: Three times a day (TID) | ORAL | Status: AC | PRN

## 2012-03-29 NOTE — Telephone Encounter (Signed)
Per dr Yvonne Kendall have #5- and take tid prn-  Pt changed drug stores--be careful

## 2012-03-29 NOTE — Telephone Encounter (Signed)
Pt would like a refill on tramadol 50mg  calling [facility] [phone]. This med was prescribed by ER md in [facility]. Pt is still in [place]. Pt was given rx for oxycodone she will not get it fill.

## 2012-04-24 ENCOUNTER — Encounter: Payer: Self-pay | Admitting: Internal Medicine

## 2012-04-24 ENCOUNTER — Ambulatory Visit (INDEPENDENT_AMBULATORY_CARE_PROVIDER_SITE_OTHER): Payer: 59 | Admitting: Internal Medicine

## 2012-04-24 VITALS — BP 100/70 | HR 68 | Temp 98.6°F | Resp 16 | Ht 67.0 in | Wt 138.0 lb

## 2012-04-24 DIAGNOSIS — G8929 Other chronic pain: Secondary | ICD-10-CM

## 2012-04-24 DIAGNOSIS — R21 Rash and other nonspecific skin eruption: Secondary | ICD-10-CM

## 2012-04-24 DIAGNOSIS — G4701 Insomnia due to medical condition: Secondary | ICD-10-CM

## 2012-04-24 DIAGNOSIS — F419 Anxiety disorder, unspecified: Secondary | ICD-10-CM

## 2012-04-24 LAB — CBC WITH DIFFERENTIAL/PLATELET
Eosinophils Relative: 2.7 % (ref 0.0–5.0)
HCT: 45.3 % (ref 36.0–46.0)
Hemoglobin: 15.1 g/dL — ABNORMAL HIGH (ref 12.0–15.0)
Lymphs Abs: 2.2 10*3/uL (ref 0.7–4.0)
Monocytes Relative: 7.2 % (ref 3.0–12.0)
Neutro Abs: 3.5 10*3/uL (ref 1.4–7.7)
RDW: 12.9 % (ref 11.5–14.6)
WBC: 6.4 10*3/uL (ref 4.5–10.5)

## 2012-04-24 MED ORDER — DOXYCYCLINE HYCLATE 100 MG PO TABS: 100.0000 mg | ORAL_TABLET | Freq: Two times a day (BID) | ORAL | Status: AC

## 2012-04-24 MED ORDER — DOXYCYCLINE HYCLATE 100 MG PO TABS: 100.0000 mg | ORAL_TABLET | Freq: Two times a day (BID) | ORAL | Status: DC

## 2012-04-24 NOTE — Progress Notes (Signed)
Subjective:    Patient ID: Gina [name], female    DOB: 1976-02-28, 36 y.o.   MRN: [id number]  HPI  Patient was in a detox program in [place] The patient was in a "rehab" out patient program She withdrew  from the opiods She was placed on clonopin She relates that she was given multiple She was exposed to "lice" or bead bugs and was treated with predisone and the rash persists The rash itches/ burns The rash has spead.   Review of Systems  Constitutional: Negative for activity change, appetite change and fatigue.  HENT: Negative for ear pain, congestion, neck pain, postnasal drip and sinus pressure.   Eyes: Negative for redness and visual disturbance.  Respiratory: Negative for cough, shortness of breath and wheezing.   Gastrointestinal: Negative for abdominal pain and abdominal distention.  Genitourinary: Negative for dysuria, frequency and menstrual problem.  Musculoskeletal: Negative for myalgias, joint swelling and arthralgias.  Skin: Negative for rash and wound.  Neurological: Negative for dizziness, weakness and headaches.  Hematological: Negative for adenopathy. Does not bruise/bleed easily.  Psychiatric/Behavioral: Negative for disturbed wake/sleep cycle and decreased concentration.   Past Medical History  Diagnosis Date  . ABDOMINAL WALL HERNIA 02/27/2010  . ALLERGIC RHINITIS 06/02/2007  . ANOREXIA, CHRONIC 08/07/2008  . ANXIETY 09/15/2010  . BACK PAIN, THORACIC REGION 07/07/2007  . BACK PAIN, THORACIC REGION 07/07/2007  . CERVICAL RADICULOPATHY 12/11/2008  . Condyloma acuminatum 04/23/2009  . CONSTIPATION 09/15/2010  . DYSPHAGIA UNSPECIFIED 09/09/2009  . 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 ENDOMETRIOSIS 12/16/2009  . FATIGUE 06/11/2010  . GERD 10/23/2009  . HERPES SIMPLEX INFECTION 06/11/2010  . LENTIGO 04/23/2009  . LUNG NODULE 06/17/2010  . Palpitations 10/23/2009  . 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 Panic disorder without agoraphobia 08/07/2008  . Stricture  and stenosis of esophagus 05/13/2010  . TOBACCO ABUSE 08/07/2008  . Heart palpitations   . Urinary tract infection   . Fibromyalgia   . Rheumatoid arthritis   . Ovarian cyst   . Abnormal Pap smear     History   Social History  . Marital Status: Married    Spouse Name: N/A    Number of Children: N/A  . Years of Education: N/A   Occupational History  . Not on file.   Social History Main Topics  . Smoking status: Current Everyday Smoker -- 1.0 packs/day    Types: Cigarettes  . Smokeless tobacco: Never Used  . Alcohol Use: No  . Drug Use: No  . Sexually Active: Yes    Birth Control/ Protection: Surgical   Other Topics Concern  . Not on file   Social History Narrative  . No narrative on file    Past Surgical History  Procedure Date  . Partial hysterectomy   . Endometrial ablation   . Hernia repair     X3  . Dilation and curettage of uterus     x1   . Cesarean section     x2   . Esophageal dilatation x 4   . Gum surgery     Family History  Problem Relation Age of Onset  . Depression Mother   . Arthritis Mother   . Hyperlipidemia Father   . Hypertension Father     Allergies  Allergen Reactions  . Ondansetron Hcl Shortness Of Breath    Throat swelled   . Zofran Swelling    Throat closed up  . Chantix (Varenicline Tartrate)     REACTION: "went crazy"  . Lamictal (  Lamotrigine)     [facility] rash  . Nicotine     Patches caused palpations lozenges throat swelled  . Skelaxin     "don't remember but I know I can't take it"  . Penicillins Rash    Current Outpatient Prescriptions on File Prior to Visit  Medication Sig Dispense Refill  . escitalopram (LEXAPRO) 20 MG tablet Take 10 mg by mouth daily.        Marland Kitchen DISCONTD: clonazePAM (KLONOPIN) 1 MG tablet Take 1 tablet (1 mg total) by mouth 2 (two) times daily.  60 tablet  1    BP 100/70  Pulse 68  Temp 98.6 F (37 C)  Resp 16  Ht 5\' 7"  (1.702 m)  Wt 138 lb (62.596 kg)  BMI 21.61 kg/m2          Objective:   Physical Exam  Nursing note and vitals reviewed. Constitutional: She is oriented to person, place, and time. She appears well-developed and well-nourished. No distress.  HENT:  Head: Normocephalic and atraumatic.  Right Ear: External ear normal.  Left Ear: External ear normal.  Nose: Nose normal.  Mouth/Throat: Oropharynx is clear and moist.  Eyes: Conjunctivae and EOM are normal. Pupils are equal, round, and reactive to light.  Neck: Normal range of motion. Neck supple. No JVD present. No tracheal deviation present. No thyromegaly present.  Cardiovascular: Normal rate, regular rhythm, normal heart sounds and intact distal pulses.   No murmur heard. Pulmonary/Chest: Effort normal and breath sounds normal. She has no wheezes. She exhibits no tenderness.  Abdominal: Soft. Bowel sounds are normal.  Musculoskeletal: Normal range of motion. She exhibits no edema and no tenderness.  Lymphadenopathy:    She has no cervical adenopathy.  Neurological: She is alert and oriented to person, place, and time. She has normal reflexes. No cranial nerve deficit.  Skin: Skin is warm and dry. She is not diaphoretic.  Psychiatric: She has a normal mood and affect. Her behavior is normal.          Assessment & Plan:  We discussed the fact that she has largely weaned herself off of her narcotic therapy and that we help her to maintain a minimal use of hydromorphone for pain control.  We talked about the essential need to continue the Lexapro the Klonopin given her anxiety and depression and believes that she tried to wean herself off these medications that she would be at high risk for recidivism. The rash appears to be a drug rash and we gave her a shot of Depo-Medrol and a Medrol burst and taper

## 2012-04-24 NOTE — Patient Instructions (Addendum)
The patient is instructed to continue all medications as prescribed. Schedule followup with check out clerk upon leaving the clinic   Look into acupuncture and Accupressure  For pain control Consider  "alternative therapies"

## 2012-05-01 ENCOUNTER — Encounter (HOSPITAL_COMMUNITY): Payer: Self-pay | Admitting: Emergency Medicine

## 2012-05-01 ENCOUNTER — Emergency Department (HOSPITAL_COMMUNITY): Payer: 59

## 2012-05-01 ENCOUNTER — Emergency Department (HOSPITAL_COMMUNITY)
Admission: EM | Admit: 2012-05-01 | Discharge: 2012-05-01 | Disposition: A | Discharge status: Home / Self Care | Payer: 59 | Attending: Emergency Medicine | Admitting: Emergency Medicine

## 2012-05-01 DIAGNOSIS — R109 Unspecified abdominal pain: Secondary | ICD-10-CM | POA: Insufficient documentation

## 2012-05-01 DIAGNOSIS — K5900 Constipation, unspecified: Secondary | ICD-10-CM | POA: Insufficient documentation

## 2012-05-01 DIAGNOSIS — Z79899 Other long term (current) drug therapy: Secondary | ICD-10-CM | POA: Insufficient documentation

## 2012-05-01 DIAGNOSIS — Z9889 Other specified postprocedural states: Secondary | ICD-10-CM | POA: Insufficient documentation

## 2012-05-01 DIAGNOSIS — F17200 Nicotine dependence, unspecified, uncomplicated: Secondary | ICD-10-CM | POA: Insufficient documentation

## 2012-05-01 DIAGNOSIS — R52 Pain, unspecified: Secondary | ICD-10-CM

## 2012-05-01 DIAGNOSIS — R110 Nausea: Secondary | ICD-10-CM | POA: Insufficient documentation

## 2012-05-01 DIAGNOSIS — IMO0001 Reserved for inherently not codable concepts without codable children: Secondary | ICD-10-CM | POA: Insufficient documentation

## 2012-05-01 DIAGNOSIS — R10819 Abdominal tenderness, unspecified site: Secondary | ICD-10-CM | POA: Insufficient documentation

## 2012-05-01 DIAGNOSIS — M549 Dorsalgia, unspecified: Secondary | ICD-10-CM | POA: Insufficient documentation

## 2012-05-01 DIAGNOSIS — R630 Anorexia: Secondary | ICD-10-CM | POA: Insufficient documentation

## 2012-05-01 MED ORDER — POLYETHYLENE GLYCOL 3350 17 GM/SCOOP PO POWD: 17.0000 g | Freq: Two times a day (BID) | ORAL | Status: DC

## 2012-05-01 MED ORDER — SODIUM CHLORIDE 0.9 % IV BOLUS (SEPSIS)
1000.0000 mL | Freq: Once | INTRAVENOUS | Status: AC
  Administered 2012-05-01: 1000 mL via INTRAVENOUS

## 2012-05-01 MED ORDER — HYDROMORPHONE HCL PF 1 MG/ML IJ SOLN
1.0000 mg | Freq: Once | INTRAMUSCULAR | Status: AC
  Administered 2012-05-01: 1 mg via INTRAVENOUS
  Filled 2012-05-01: qty 1

## 2012-05-01 MED ORDER — NAPROXEN 500 MG PO TABS: 500.0000 mg | ORAL_TABLET | Freq: Two times a day (BID) | ORAL | Status: DC

## 2012-05-01 MED ORDER — PROMETHAZINE HCL 25 MG/ML IJ SOLN
25.0000 mg | Freq: Once | INTRAMUSCULAR | Status: AC
  Administered 2012-05-01: 25 mg via INTRAVENOUS
  Filled 2012-05-01: qty 1

## 2012-05-01 MED ORDER — SENNOSIDES-DOCUSATE SODIUM 8.6-50 MG PO TABS: 1.0000 | ORAL_TABLET | Freq: Every day | ORAL | Status: DC

## 2012-05-01 MED ORDER — LORAZEPAM 2 MG/ML IJ SOLN
0.5000 mg | Freq: Once | INTRAMUSCULAR | Status: AC
  Administered 2012-05-01: 0.5 mg via INTRAVENOUS
  Filled 2012-05-01: qty 1

## 2012-05-01 MED ORDER — HYDROMORPHONE HCL PF 1 MG/ML IJ SOLN
1.0000 mg | Freq: Once | INTRAMUSCULAR | Status: AC
  Administered 2012-05-01: 1 mg via INTRAMUSCULAR
  Filled 2012-05-01: qty 1

## 2012-05-01 MED ORDER — POLYETHYLENE GLYCOL 3350 17 GM/SCOOP PO POWD: 17.0000 g | Freq: Two times a day (BID) | ORAL | Status: AC

## 2012-05-01 MED ORDER — PROMETHAZINE HCL 25 MG/ML IJ SOLN
25.0000 mg | Freq: Once | INTRAMUSCULAR | Status: AC
  Administered 2012-05-01: 25 mg via INTRAMUSCULAR
  Filled 2012-05-01: qty 1

## 2012-05-01 MED ORDER — MAGNESIUM CITRATE PO SOLN: 296.0000 mL | Freq: Once | ORAL | Status: DC

## 2012-05-01 MED ORDER — MAGNESIUM CITRATE PO SOLN: 296.0000 mL | Freq: Once | ORAL | Status: AC

## 2012-05-01 NOTE — ED Notes (Signed)
Pt sts she went to zumba today and has been feeling sore all over since. Daughter is currently in ED as well for attempted suicide which she sts is making her anxiety very bad. Patient reports pain all over 10/10. Patient tearful at this time. Sts she also had fibromyalgia and feels it was irritated by zumba.

## 2012-05-01 NOTE — ED Notes (Signed)
Informed MD Hyacinth Meeker that patient experiencing a lot of pain.

## 2012-05-01 NOTE — ED Notes (Signed)
Patient requesting to see MD Hyacinth Meeker due to pain and stomach swelling. Sts her pain has not been relieved.

## 2012-05-01 NOTE — ED Notes (Signed)
Pt has discharge paperwork, waiting for bolus to finish, then pt will be discharged, ambulated to restroom w/o difficulty.

## 2012-05-01 NOTE — Discharge Instructions (Signed)
You have been diagnosed with undifferentiated abdominal pain.  Abdominal pain can be caused by many things. Your caregiver evaluates the seriousness of your pain by an examination and possibly blood or urine tests and imaging (CT scan, x-rays, ultrasound). Many cases can be observed and treated at home after initial evaluation in the emergency department. Even though you are being discharged home, abdominal pain can be unpredictable. Therefore, you need a repeat exam if your pain does not resolve, returns, or worsens. Most patient's with abdominal pain do not need to be admitted to the hospital or have surgery, but serious problems like appendicitis and gallbladder attacks can start out as nonspecific pain. Many abdominal conditions cannot be diagnosed in 1 visit, so followup evaluations are very important.  Seek immediate medical attention if:  *The pain does not go away or becomes severe. *Temperature above 101 develops *Repeated vomiting occurs(multiple episodes) *The pain becomes localized to portions of the abdomen. The right side could possibly be appendicitis. In an adult, the left lower portion of the abdomen could be colitis or diverticulitis. *Blood is being passed in stools or vomit *Return also if you develop chest pain, difficulty breathing, dizziness or fainting, or become confused poorly responsive or inconsolable (young children).     If you do not have a physician, you should reference the below phone numbers and call in the morning to establish follow up care.  RESOURCE GUIDE  Dental Problems  Patients with Medicaid: [facility]                     [facility] [address]                                           [address] Phone:  [phone]                                                  Phone:  [phone]  If unable to pay or uninsured, contact:  [facility] or [facility] to become qualified for the adult dental  clinic.  Chronic Pain Problems Contact [facility]  [phone] Patients need to be referred by their primary care doctor.  Insufficient Money for Medicine Contact [employer]:  call "211" or [facility] 271-5999.  No Primary Care Doctor Call [facility]  832-8000 Other agencies that provide inexpensive medical care    [facility]  [phone]    [facility]  [phone]    [facility]  [phone]    Women's Clinic  832-4777    [facility]  [phone]    [facility]  [phone]  Psychological Services [facility]  [phone] [facility]  [phone] [facility]   800 853-5163 (emergency services 641-4993)  Substance Abuse Resources [facility]  336-882-2125 [facility] 336-784-9470 [facility] 336-285-9073 [facility] 336-845-3988 Residential & Outpatient Substance Abuse Program  800-659-3381  Abuse/Neglect [place] Child Abuse Hotline (336) 641-3795 [place] Child Abuse Hotline 800-378-5315 (After Hours)  Emergency Shelter [facility] (336) 271-5985  [facility] (336) 275-9566 [facility]   Services (704) 372-4663  MRSA Hotline #:   832-7006    [place] Resources  [facility] [place]     [employer]                          [facility] [address] [place]                       [address]      [address]  [place]                                                [place]                            [place] Phone:  [phone]                                   Phone:  [phone]                 Phone:  [phone]  [facility] Phone:  [phone]  [place] Child Abuse Hotline (336) 342-1394 (336) 342-3537 (After Hours)    

## 2012-05-01 NOTE — ED Notes (Signed)
Pt. Refused to finish remaining treatment of IV Fluids. RN Lauren notified, D/C of IV was approved.

## 2012-05-01 NOTE — ED Provider Notes (Signed)
History     CSN: [id number]  Arrival date & time 05/01/12  0031   First MD Initiated Contact with Patient 05/01/12 [phone]      Chief Complaint  Patient presents with  . Generalized Body Aches    (Consider location/radiation/quality/duration/timing/severity/associated sxs/prior treatment) HPI Comments: Patient admits to having chronic pain with fibromyalgia, chronic back pain, chronic abdominal pain after 9 abdominal surgeries. She states that over the last 2 days she's had increasing pain across her bilateral upper shoulders through her bilateral lower back and her left lower quadrant of her abdomen. This has been persistent, gradually worsening, not associated with vomiting however she does have some nausea. She denies dysuria, diarrhea, constipation and has been passing gas normally today. There is been no fevers chills sweating coughing or shortness of breath. There are no rashes or swelling. She has not been taking her chronic pain medication which she states she weaned herself off of last month which was oxycodone. She has tried tramadol and Tylenol without relief prior to arrival. She admits to taking Skelaxin, Lyrica and Neurontin in the past without any improvement. Accompanying the patient is her daughter who is a separate patient in the emergency department for other reasons. She states that she had an interaction with her daughter tonight which caused mental health flareup of her daughter which made her feel more pain in her back and neck.  The history is provided by the patient.    Past Medical History  Diagnosis Date  . ABDOMINAL WALL HERNIA 02/27/2010  . ALLERGIC RHINITIS 06/02/2007  . ANOREXIA, CHRONIC 08/07/2008  . ANXIETY 09/15/2010  . BACK PAIN, THORACIC REGION 07/07/2007  . BACK PAIN, THORACIC REGION 07/07/2007  . CERVICAL RADICULOPATHY 12/11/2008  . Condyloma acuminatum 04/23/2009  . CONSTIPATION 09/15/2010  . DYSPHAGIA UNSPECIFIED 09/09/2009  . 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  ENDOMETRIOSIS 12/16/2009  . FATIGUE 06/11/2010  . GERD 10/23/2009  . HERPES SIMPLEX INFECTION 06/11/2010  . LENTIGO 04/23/2009  . LUNG NODULE 06/17/2010  . Palpitations 10/23/2009  . 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 Panic disorder without agoraphobia 08/07/2008  . Stricture and stenosis of esophagus 05/13/2010  . TOBACCO ABUSE 08/07/2008  . Heart palpitations   . Urinary tract infection   . Fibromyalgia   . Rheumatoid arthritis   . Ovarian cyst   . Abnormal Pap smear   . Fibromyalgia     Past Surgical History  Procedure Date  . Partial hysterectomy   . Endometrial ablation   . Hernia repair     X3  . Dilation and curettage of uterus     x1   . Cesarean section     x2   . Esophageal dilatation x 4   . Gum surgery     Family History  Problem Relation Age of Onset  . Depression Mother   . Arthritis Mother   . Hyperlipidemia Father   . Hypertension Father     History  Substance Use Topics  . Smoking status: Current Everyday Smoker -- 1.0 packs/day    Types: Cigarettes  . Smokeless tobacco: Never Used  . Alcohol Use: No    OB History    Grav Para Term Preterm Abortions TAB SAB Ect Mult Living   3 2 2  0 1 0 1 0 0 2      Review of Systems  All other systems reviewed and are negative.    Allergies  Ondansetron hcl; Zofran; Chantix; Lamictal; Nicotine; Skelaxin; and Penicillins  Home Medications  Current Outpatient Rx  Name Route Sig Dispense Refill  . ACETAMINOPHEN 325 MG PO TABS Oral Take 650 mg by mouth every 6 (six) hours as needed. For pain    . CLONAZEPAM 1 MG PO TABS Oral Take 1 mg by mouth 2 (two) times daily.    . CYCLOBENZAPRINE HCL 10 MG PO TABS Oral Take 10 mg by mouth 3 (three) times daily as needed. For muscle spasms    . DOXYCYCLINE HYCLATE 100 MG PO TABS Oral Take 1 tablet (100 mg total) by mouth 2 (two) times daily. 28 tablet 0  . ESCITALOPRAM OXALATE 20 MG PO TABS Oral Take 20 mg by mouth daily.     Marland Kitchen MELATIN PO Oral Take 1  tablet by mouth at bedtime as needed. For sleep    . TRAMADOL HCL 50 MG PO TABS Oral Take 50 mg by mouth every 6 (six) hours as needed. For pain    . MAGNESIUM CITRATE PO SOLN Oral Take 296 mLs by mouth once. OTC 300 mL 0  . NAPROXEN 500 MG PO TABS Oral Take 1 tablet (500 mg total) by mouth 2 (two) times daily with a meal. 30 tablet 0  . POLYETHYLENE GLYCOL 3350 PO POWD Oral Take 17 g by mouth 2 (two) times daily. 255 g 0  . SENNOSIDES-DOCUSATE SODIUM 8.6-50 MG PO TABS Oral Take 1 tablet by mouth daily. 30 tablet 0    BP 108/66  Pulse 61  Temp 98.1 F (36.7 C) (Oral)  Resp 16  SpO2 99%  Physical Exam  Nursing note and vitals reviewed. Constitutional: She appears well-developed and well-nourished. No distress.  HENT:  Head: Normocephalic and atraumatic.  Mouth/Throat: Oropharynx is clear and moist. No oropharyngeal exudate.  Eyes: Conjunctivae and EOM are normal. Pupils are equal, round, and reactive to light. Right eye exhibits no discharge. Left eye exhibits no discharge. No scleral icterus.  Neck: Normal range of motion. Neck supple. No JVD present. No thyromegaly present.  Cardiovascular: Normal rate, regular rhythm, normal heart sounds and intact distal pulses.  Exam reveals no gallop and no friction rub.   No murmur heard. Pulmonary/Chest: Effort normal and breath sounds normal. No respiratory distress. She has no wheezes. She has no rales.  Abdominal: Soft. Bowel sounds are normal. She exhibits no distension and no mass. There is tenderness ( Mild tenderness to palpation over the left lower cautery, no distention, no tympanitic sounds to percussion, normal bowel sounds, no tenderness at any other site of the abdomen. Soft and non-peritoneal).  Musculoskeletal: Normal range of motion. She exhibits tenderness ( Bilateral tenderness of the trapezius, rhomboids, midthoracic and lumbar region paraspinal muscles tenderness.). She exhibits no edema.  Lymphadenopathy:    She has no  cervical adenopathy.  Neurological: She is alert. Coordination normal.  Skin: Skin is warm and dry. No rash noted. No erythema.  Psychiatric: She has a normal mood and affect. Her behavior is normal.    ED Course  Procedures (including critical care time)  Labs Reviewed - No data to display Dg Abd Acute W/chest  05/01/2012  *RADIOLOGY REPORT*  Clinical Data: Left upper quadrant pain, nausea.  ACUTE ABDOMEN SERIES (ABDOMEN 2 VIEW & CHEST 1 VIEW)  Comparison: 06/11/2011  Findings: Lungs are clear.  Cardiomediastinal contours are within normal limits.  Nonobstructive bowel gas pattern.  Organ outlines normal where seen.  No free intraperitoneal air.  Surgical mesh coils project over the pelvis.  No acute osseous finding.  IMPRESSION: Nonobstructive bowel gas pattern.  Original Report Authenticated By: Waneta Martins, M.D.     1. Body aches   2. Abdominal pain   3. Constipation       MDM  The patient appears upset regarding her daughter's presence in the emergency department for her issues, requests her medications as she has multiple medication allergies, will give one dose of pain medicine, nausea medicine and a benzodiazepine to help with muscle relaxation and anxiety. Patient does she will not receive prescriptions for narcotic medications and states that she does not want any.  I have personally interpreted the x-rays and found there to be a large amount of stool in the left side of the abdomen and:, Nonobstructive bowel gas pattern, no free air. Abdomen reexamined, still has mild left lower quadrant pain, no distention, no tympanitic sounds, normal bowel sounds. The patient states that her neck and back pain is essentially resolved but still has left lower quadrant pain. I've explained her results to her, I do not think that she has a clinical bowel obstruction. She states that she has been vomiting persistently since arrival however she has no vomitus in the emesis bag at the bedside  and no vomit in the garbage can. She continues to request medication such as Ativan, Phenergan, hydromorphone. I will give her IV fluids one more dose of Phenergan and discharge her home to followup with her family Dr.  Discharge Prescriptions include:  miralax Colace / senna Mag Citrate Naprosyn      Vida Roller, MD 05/01/12 [phone]

## 2012-05-01 NOTE — ED Notes (Signed)
Pt given discharge paperwork by Lillia Abed, RN, pt's IV dc'd, escorted to discharge window. In no distress upon discharge.

## 2012-05-01 NOTE — ED Notes (Signed)
Informed MD Hyacinth Meeker patient is experiencing a lot of pain.

## 2012-05-01 NOTE — ED Notes (Signed)
Pt c/o body pain, pt states she is a chronic pain pt, has been off her pain meds x 2 months, pt states she does have Fibromyalgia and did Zumba today now having pain. Pt also c/o severe anxiety related to daughter who is a patient here

## 2012-05-26 ENCOUNTER — Other Ambulatory Visit: Payer: Self-pay | Admitting: Internal Medicine

## 2012-06-01 ENCOUNTER — Encounter (HOSPITAL_BASED_OUTPATIENT_CLINIC_OR_DEPARTMENT_OTHER): Payer: Self-pay | Admitting: Emergency Medicine

## 2012-06-01 ENCOUNTER — Emergency Department (HOSPITAL_BASED_OUTPATIENT_CLINIC_OR_DEPARTMENT_OTHER): Payer: 59

## 2012-06-01 ENCOUNTER — Emergency Department (HOSPITAL_BASED_OUTPATIENT_CLINIC_OR_DEPARTMENT_OTHER)
Admission: EM | Admit: 2012-06-01 | Discharge: 2012-06-02 | Disposition: A | Discharge status: Home / Self Care | Payer: 59 | Attending: Emergency Medicine | Admitting: Emergency Medicine

## 2012-06-01 DIAGNOSIS — R109 Unspecified abdominal pain: Secondary | ICD-10-CM | POA: Insufficient documentation

## 2012-06-01 DIAGNOSIS — R10819 Abdominal tenderness, unspecified site: Secondary | ICD-10-CM | POA: Insufficient documentation

## 2012-06-01 DIAGNOSIS — R110 Nausea: Secondary | ICD-10-CM | POA: Insufficient documentation

## 2012-06-01 LAB — URINALYSIS, ROUTINE W REFLEX MICROSCOPIC
Glucose, UA: NEGATIVE mg/dL
Hgb urine dipstick: NEGATIVE
Ketones, ur: NEGATIVE mg/dL
Protein, ur: NEGATIVE mg/dL

## 2012-06-01 LAB — COMPREHENSIVE METABOLIC PANEL
ALT: 10 U/L (ref 0–35)
AST: 16 U/L (ref 0–37)
CO2: 27 mEq/L (ref 19–32)
Chloride: 103 mEq/L (ref 96–112)
GFR calc non Af Amer: >90 mL/min (ref >90)
Potassium: 3.8 mEq/L (ref 3.5–5.1)
Sodium: 139 mEq/L (ref 135–145)
Total Bilirubin: 0.2 mg/dL — ABNORMAL LOW (ref 0.3–1.2)

## 2012-06-01 LAB — CBC WITH DIFFERENTIAL/PLATELET
Basophils Absolute: 0 10*3/uL (ref 0.0–0.1)
HCT: 38.3 % (ref 36.0–46.0)
Lymphocytes Relative: 43 % (ref 12–46)
Neutro Abs: 3.6 10*3/uL (ref 1.7–7.7)
Platelets: 178 10*3/uL (ref 150–400)
RDW: 11.8 % (ref 11.5–15.5)
WBC: 7.9 10*3/uL (ref 4.0–10.5)

## 2012-06-01 MED ORDER — IOHEXOL 300 MG/ML  SOLN
20.0000 mL | INTRAMUSCULAR | Status: AC
  Administered 2012-06-01: 20 mL via ORAL

## 2012-06-01 MED ORDER — HYDROMORPHONE HCL PF 1 MG/ML IJ SOLN
1.0000 mg | Freq: Once | INTRAMUSCULAR | Status: AC
  Administered 2012-06-01: 1 mg via INTRAVENOUS

## 2012-06-01 MED ORDER — LORAZEPAM 2 MG/ML IJ SOLN
1.0000 mg | Freq: Once | INTRAMUSCULAR | Status: AC
  Administered 2012-06-01: 1 mg via INTRAVENOUS
  Filled 2012-06-01: qty 1

## 2012-06-01 MED ORDER — HYDROMORPHONE HCL PF 2 MG/ML IJ SOLN
2.0000 mg | Freq: Once | INTRAMUSCULAR | Status: AC
  Administered 2012-06-01: 2 mg via INTRAVENOUS
  Filled 2012-06-01: qty 1

## 2012-06-01 MED ORDER — IOHEXOL 300 MG/ML  SOLN
100.0000 mL | Freq: Once | INTRAMUSCULAR | Status: AC | PRN
  Administered 2012-06-01: 100 mL via INTRAVENOUS

## 2012-06-01 MED ORDER — HYDROMORPHONE HCL PF 1 MG/ML IJ SOLN
INTRAMUSCULAR | Status: AC
  Filled 2012-06-01: qty 1

## 2012-06-01 MED ORDER — PROMETHAZINE HCL 25 MG/ML IJ SOLN
25.0000 mg | Freq: Once | INTRAMUSCULAR | Status: AC
  Administered 2012-06-01: 25 mg via INTRAVENOUS
  Filled 2012-06-01: qty 1

## 2012-06-01 MED ORDER — LORAZEPAM 2 MG/ML IJ SOLN
1.0000 mg | Freq: Once | INTRAMUSCULAR | Status: AC
  Administered 2012-06-01: 2 mg via INTRAVENOUS
  Filled 2012-06-01: qty 1

## 2012-06-01 MED ORDER — PROMETHAZINE HCL 25 MG/ML IJ SOLN: 25.0000 mg | Freq: Once | INTRAMUSCULAR | Status: DC

## 2012-06-01 MED ORDER — HYDROMORPHONE HCL PF 1 MG/ML IJ SOLN
1.0000 mg | Freq: Once | INTRAMUSCULAR | Status: AC
  Administered 2012-06-01: 1 mg via INTRAVENOUS
  Filled 2012-06-01: qty 1

## 2012-06-01 NOTE — ED Notes (Signed)
Has endometriosis with hx of surgery.  Has had pain for past couple months but much worse tonight.  Pain has causes nausea and vomiting.  Pain is located in her suprapubic area and radiates down into vaginal area.

## 2012-06-01 NOTE — ED Provider Notes (Signed)
On reevaluation patient assured that her CT results were unrevealing. Her colon is decompressed but on further evaluation the husband states that she does enemas daily. Patient doesn't admit to see doing an enema today. While on exam she multiple times stated that her abdomen was hurting various places.  At this point there is no clear-cut explanation for her abdominal pain however given her multiple surgeries and history of endometriosis that could be scar tissue versus endometriosis. Patient was pain controlled and discharged home to followup with her PCP  Gwyneth Sprout, MD 06/01/12 [phone]

## 2012-06-01 NOTE — ED Provider Notes (Signed)
History     CSN: [id number]  Arrival date & time 06/01/12  2021   First MD Initiated Contact with Patient 06/01/12 2038      Chief Complaint  Patient presents with  . Abdominal Pain    (Consider location/radiation/quality/duration/timing/severity/associated sxs/prior treatment) Patient is a 36 y.o. female presenting with abdominal pain. The history is provided by the patient. No language interpreter was used.  Abdominal Pain The primary symptoms of the illness include abdominal pain and nausea. The current episode started 13 to 24 hours ago. The onset of the illness was gradual. The problem has been rapidly worsening.  The patient has not had a change in bowel habit.  Pt reports she is not constipated.  Pt has a history of endometriosis.   Pt has had 9 abdominal surgerys.  Pt reports pain feels like endometriosis. Pt complains of pain in her low back.     Past Medical History  Diagnosis Date  . ABDOMINAL WALL HERNIA 02/27/2010  . ALLERGIC RHINITIS 06/02/2007  . ANOREXIA, CHRONIC 08/07/2008  . ANXIETY 09/15/2010  . BACK PAIN, THORACIC REGION 07/07/2007  . BACK PAIN, THORACIC REGION 07/07/2007  . CERVICAL RADICULOPATHY 12/11/2008  . Condyloma acuminatum 04/23/2009  . CONSTIPATION 09/15/2010  . DYSPHAGIA UNSPECIFIED 09/09/2009  . 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 ENDOMETRIOSIS 12/16/2009  . FATIGUE 06/11/2010  . GERD 10/23/2009  . HERPES SIMPLEX INFECTION 06/11/2010  . LENTIGO 04/23/2009  . LUNG NODULE 06/17/2010  . Palpitations 10/23/2009  . 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 Panic disorder without agoraphobia 08/07/2008  . Stricture and stenosis of esophagus 05/13/2010  . TOBACCO ABUSE 08/07/2008  . Heart palpitations   . Urinary tract infection   . Fibromyalgia   . Rheumatoid arthritis   . Ovarian cyst   . Abnormal Pap smear   . Fibromyalgia     Past Surgical History  Procedure Date  . Partial hysterectomy   . Endometrial ablation   . Hernia repair     X3  . Dilation  and curettage of uterus     x1   . Cesarean section     x2   . Esophageal dilatation x 4   . Gum surgery     Family History  Problem Relation Age of Onset  . Depression Mother   . Arthritis Mother   . Hyperlipidemia Father   . Hypertension Father     History  Substance Use Topics  . Smoking status: Current Everyday Smoker -- 1.0 packs/day    Types: Cigarettes  . Smokeless tobacco: Never Used  . Alcohol Use: No    OB History    Grav Para Term Preterm Abortions TAB SAB Ect Mult Living   3 2 2  0 1 0 1 0 0 2      Review of Systems  Gastrointestinal: Positive for nausea and abdominal pain.  All other systems reviewed and are negative.    Allergies  Lamictal; Ondansetron hcl; Zofran; Chantix; Nicotine; Skelaxin; and Penicillins  Home Medications   Current Outpatient Rx  Name Route Sig Dispense Refill  . ACETAMINOPHEN 325 MG PO TABS Oral Take 650 mg by mouth daily. For pain    . CLONAZEPAM 2 MG PO TABS Oral Take 1 mg by mouth 4 (four) times daily.    . CYCLOBENZAPRINE HCL 10 MG PO TABS Oral Take 10 mg by mouth 3 (three) times daily as needed. For muscle spasms    . ESCITALOPRAM OXALATE 10 MG PO TABS Oral Take 10 mg by  mouth daily.    Marland Kitchen FLUCONAZOLE 150 MG PO TABS Oral Take 150 mg by mouth once.    Marland Kitchen VICODIN PO Oral Take 1 tablet by mouth once as needed. For pain    . MELATIN PO Oral Take 1 tablet by mouth at bedtime as needed. For sleep    . DISPOSABLE ENEMA 19-7 GM/118ML RE ENEM Rectal Place 1 enema rectally once. follow package directions    . TRAMADOL HCL 50 MG PO TABS Oral Take 50 mg by mouth 2 (two) times daily as needed. For pain      BP 105/70  Pulse 97  Temp 98.7 F (37.1 C) (Oral)  Resp 16  Ht 5\' 7"  (1.702 m)  Wt 138 lb (62.596 kg)  BMI 21.61 kg/m2  SpO2 98%  Physical Exam  Vitals reviewed. Constitutional: She appears well-developed and well-nourished.  HENT:  Head: Normocephalic and atraumatic.  Eyes: Conjunctivae and EOM are normal. Pupils are  equal, round, and reactive to light.  Neck: Normal range of motion.  Cardiovascular: Normal rate and normal heart sounds.   Pulmonary/Chest: Effort normal and breath sounds normal.  Abdominal: Soft. There is tenderness.  Musculoskeletal: Normal range of motion.  Neurological: She is alert.  Skin: Skin is warm.    ED Course  Procedures (including critical care time)  Labs Reviewed  COMPREHENSIVE METABOLIC PANEL - Abnormal; Notable for the following:    Total Bilirubin 0.2 (*)     All other components within normal limits  CBC WITH DIFFERENTIAL  LIPASE, BLOOD  URINALYSIS, ROUTINE W REFLEX MICROSCOPIC   No results found.   No diagnosis found.    MDM  Pt reports she responds best to dilaudid and phenergan.   Pt is requesting ativan due to anxiety.   I reviewed records.   I will treat pain and anxiety,   I discussed with Dr. Anitra Lauth.   I will order ct due to multiple surgeries to make sure pt does not have an obstruction.  (Pt has a history of chronic pain and long term narcotic use.) Pt's care turned over to Dr. Anitra Lauth 10 pm        Lonia Skinner [place], [place] 06/01/12 2217

## 2012-06-01 NOTE — ED Notes (Signed)
Pt reports to using enemas daily several times a day, husband stated that he has attempted to get her to stop using them. Pt educated on the chronic use of daily enemas.

## 2012-06-02 MED ORDER — OXYCODONE-ACETAMINOPHEN 5-325 MG PO TABS: 1.0000 | ORAL_TABLET | Freq: Four times a day (QID) | ORAL | Status: DC | PRN

## 2012-06-02 NOTE — ED Provider Notes (Signed)
Medical screening examination/treatment/procedure(s) were conducted as a shared visit with non-physician practitioner(s) and myself.  I personally evaluated the patient during the encounter   Gwyneth Sprout, MD 06/02/12 [phone]

## 2012-06-04 ENCOUNTER — Observation Stay (HOSPITAL_COMMUNITY)
Admission: AD | Admit: 2012-06-04 | Discharge: 2012-06-05 | Disposition: A | Discharge status: Home / Self Care | Payer: 59 | Source: Ambulatory Visit | Attending: Obstetrics and Gynecology | Admitting: Obstetrics and Gynecology

## 2012-06-04 ENCOUNTER — Encounter (HOSPITAL_COMMUNITY): Payer: Self-pay | Admitting: *Deleted

## 2012-06-04 DIAGNOSIS — R112 Nausea with vomiting, unspecified: Secondary | ICD-10-CM

## 2012-06-04 DIAGNOSIS — Z90710 Acquired absence of both cervix and uterus: Secondary | ICD-10-CM | POA: Insufficient documentation

## 2012-06-04 DIAGNOSIS — R109 Unspecified abdominal pain: Secondary | ICD-10-CM

## 2012-06-04 LAB — CBC WITH DIFFERENTIAL/PLATELET
Basophils Absolute: 0 10*3/uL (ref 0.0–0.1)
Eosinophils Relative: 3 % (ref 0–5)
HCT: 39.9 % (ref 36.0–46.0)
Hemoglobin: 13.8 g/dL (ref 12.0–15.0)
Lymphocytes Relative: 38 % (ref 12–46)
MCV: 93.4 fL (ref 78.0–100.0)
Monocytes Absolute: 0.6 10*3/uL (ref 0.1–1.0)
Monocytes Relative: 7 % (ref 3–12)
RDW: 12.1 % (ref 11.5–15.5)
WBC: 8.6 10*3/uL (ref 4.0–10.5)

## 2012-06-04 LAB — COMPREHENSIVE METABOLIC PANEL
AST: 13 U/L (ref 0–37)
BUN: 10 mg/dL (ref 6–23)
CO2: 26 mEq/L (ref 19–32)
Calcium: 9.2 mg/dL (ref 8.4–10.5)
Creatinine, Ser: 0.47 mg/dL — ABNORMAL LOW (ref 0.50–1.10)
GFR calc Af Amer: >90 mL/min (ref >90)
GFR calc non Af Amer: >90 mL/min (ref >90)
Glucose, Bld: 92 mg/dL (ref 70–99)
Total Bilirubin: 0.3 mg/dL (ref 0.3–1.2)

## 2012-06-04 MED ORDER — HYDROMORPHONE HCL PF 1 MG/ML IJ SOLN
0.5000 mg | Freq: Once | INTRAMUSCULAR | Status: AC
  Administered 2012-06-04: 0.5 mg via INTRAVENOUS
  Filled 2012-06-04: qty 1

## 2012-06-04 MED ORDER — LACTATED RINGERS IV SOLN
INTRAVENOUS | Status: DC
  Administered 2012-06-04 – 2012-06-05 (×2): via INTRAVENOUS

## 2012-06-04 MED ORDER — PROMETHAZINE HCL 25 MG/ML IJ SOLN
12.5000 mg | Freq: Once | INTRAMUSCULAR | Status: AC
  Administered 2012-06-04: 12.5 mg via INTRAVENOUS
  Filled 2012-06-04: qty 1

## 2012-06-04 MED ORDER — LORAZEPAM 2 MG/ML IJ SOLN
1.0000 mg | Freq: Once | INTRAMUSCULAR | Status: AC
  Administered 2012-06-04: 1 mg via INTRAVENOUS
  Filled 2012-06-04: qty 1

## 2012-06-04 NOTE — Progress Notes (Signed)
Pt goes on to say that she has low back that goes into her left leg.  She is gagging while talking, but no emesis.

## 2012-06-04 NOTE — MAU Provider Note (Signed)
History     CSN: [id number]  Arrival date and time: 06/04/12 2123   First Provider Initiated Contact with Patient 06/04/12 2224      Chief Complaint  Patient presents with  . Abdominal Pain   HPI Gina [name] is a 36 y.o. female who presents to MAU for abdominal pain. She is not pregnant. She has had hysterectomy.  The pain started tonight approximately 7 pm. She rates the pain as 9/10. She describes the pain as sharp ripping and cramping. Associated symptoms include nausea, vomiting and feeling dizzy. The pain is located in the lower abdomen and radiates to the lower back and down the left leg. She was evaluated at [facility] last week and had complete evaluation including CT of abdomen and pelvis which showed mild colitis.  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 She is scheduled to see Dr. Jennette Kettle in the morning in the office. Patient and her husband request admission for the patient for pain management. The history was provided by the patient.  OB History    Grav Para Term Preterm Abortions TAB SAB Ect Mult Living   3 2 2  0 1 0 1 0 0 2      Past Medical History  Diagnosis Date  . ABDOMINAL WALL HERNIA 02/27/2010  . ALLERGIC RHINITIS 06/02/2007  . ANOREXIA, CHRONIC 08/07/2008  . ANXIETY 09/15/2010  . BACK PAIN, THORACIC REGION 07/07/2007  . BACK PAIN, THORACIC REGION 07/07/2007  . CERVICAL RADICULOPATHY 12/11/2008  . Condyloma acuminatum 04/23/2009  . CONSTIPATION 09/15/2010  . DYSPHAGIA UNSPECIFIED 09/09/2009  . 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 ENDOMETRIOSIS 12/16/2009  . FATIGUE 06/11/2010  . GERD 10/23/2009  . HERPES SIMPLEX INFECTION 06/11/2010  . LENTIGO 04/23/2009  . LUNG NODULE 06/17/2010  . Palpitations 10/23/2009  . 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 Panic disorder without agoraphobia 08/07/2008  . Stricture and stenosis of esophagus 05/13/2010  . TOBACCO ABUSE 08/07/2008  . Heart  palpitations   . Urinary tract infection   . Fibromyalgia   . Rheumatoid arthritis   . Ovarian cyst   . Abnormal Pap smear   . Fibromyalgia     Past Surgical History  Procedure Date  . Partial hysterectomy   . Endometrial ablation   . Hernia repair     X3  . Dilation and curettage of uterus     x1   . Cesarean section     x2   . Esophageal dilatation x 4   . Gum surgery     Family History  Problem Relation Age of Onset  . Depression Mother   . Arthritis Mother   . Hyperlipidemia Father   . Hypertension Father     History  Substance Use Topics  . Smoking status: Current Everyday Smoker -- 1.0 packs/day    Types: Cigarettes  . Smokeless tobacco: Never Used  . Alcohol Use: No    Allergies:  Allergies  Allergen Reactions  . Lamictal (Lamotrigine) Anaphylaxis  . Ondansetron Hcl Anaphylaxis  . Zofran Anaphylaxis  . Chantix (Varenicline Tartrate) Other (See Comments)    "went crazy"  . Nicotine     Patches caused palpations lozenges throat swelled  . Skelaxin Other (See Comments)    unknown  . Penicillins Rash    Prescriptions prior to admission  Medication Sig Dispense Refill  . acetaminophen (TYLENOL) 325 MG tablet Take 650 mg by mouth daily. For pain      .  clonazePAM (KLONOPIN) 2 MG tablet Take 1 mg by mouth 4 (four) times daily.      . cyclobenzaprine (FLEXERIL) 10 MG tablet Take 10 mg by mouth 3 (three) times daily as needed. For muscle spasms      . escitalopram (LEXAPRO) 10 MG tablet Take 10 mg by mouth daily.      . fluconazole (DIFLUCAN) 150 MG tablet Take 150 mg by mouth once.      . Hydrocodone-Acetaminophen (VICODIN PO) Take 1 tablet by mouth once as needed. For pain      . Melatonin-Pyridoxine (MELATIN PO) Take 1 tablet by mouth at bedtime as needed. For sleep      . sodium phosphate (FLEET) enema Place 1 enema rectally once. follow package directions      . traMADol (ULTRAM) 50 MG tablet Take 50 mg by mouth 2 (two) times daily as needed. For  pain      . oxyCODONE-acetaminophen (PERCOCET/ROXICET) 5-325 MG per tablet Take 1-2 tablets by mouth every 6 (six) hours as needed for pain.  20 tablet  0    Review of Systems  Constitutional: Positive for malaise/fatigue. Negative for fever, chills and weight loss.  HENT: Negative for ear pain, nosebleeds, congestion, sore throat and neck pain.   Eyes: Negative for blurred vision, double vision, photophobia and pain.  Respiratory: Negative for cough, shortness of breath and wheezing.   Cardiovascular: Negative for chest pain, palpitations and leg swelling.  Gastrointestinal: Positive for nausea, vomiting and abdominal pain. Negative for heartburn, diarrhea and constipation.  Genitourinary: Negative for dysuria, urgency and frequency.  Musculoskeletal: Positive for back pain. Negative for myalgias.  Skin: Negative for itching and rash.  Neurological: Positive for dizziness. Negative for sensory change, speech change, seizures, weakness and headaches.  Endo/Heme/Allergies: Does not bruise/bleed easily.  Psychiatric/Behavioral: Positive for depression. The patient is nervous/anxious. The patient does not have insomnia.        Hx of substance abuse    Physical Exam   Blood pressure 116/77, pulse 87, temperature 98.8 F (37.1 C), temperature source Oral, resp. rate 20, SpO2 99.00%.  Physical Exam  Nursing note and vitals reviewed. Constitutional: She is oriented to person, place, and time. She appears well-developed and well-nourished.       Patient crying and calling loud that she needs something for pain.  HENT:  Head: Normocephalic and atraumatic.  Eyes: EOM are normal.  Neck: Neck supple.  Cardiovascular: Normal rate.   Respiratory: Effort normal.  GI: Soft. Bowel sounds are increased. There is tenderness in the right lower quadrant and left lower quadrant. There is guarding. There is no rebound.  Musculoskeletal: Normal range of motion.  Neurological: She is alert and oriented  to person, place, and time.  Skin: Skin is warm and dry.  Psychiatric: Her speech is normal. Judgment and thought content normal. Her mood appears anxious. She is agitated. Cognition and memory are normal.   Results for orders placed during the hospital encounter of 06/04/12 (from the past 24 hour(s))  CBC WITH DIFFERENTIAL     Status: Normal   Collection Time   06/04/12 11:04 PM      Component Value Range   WBC 8.6  4.0 - 10.5 K/uL   RBC 4.27  3.87 - 5.11 MIL/uL   Hemoglobin 13.8  12.0 - 15.0 g/dL   HCT 16.1  09.6 - 04.5 %   MCV 93.4  78.0 - 100.0 fL   MCH 32.3  26.0 - 34.0 pg  MCHC 34.6  30.0 - 36.0 g/dL   RDW 16.1  09.6 - 04.5 %   Platelets 178  150 - 400 K/uL   Neutrophils Relative 52  43 - 77 %   Neutro Abs 4.5  1.7 - 7.7 K/uL   Lymphocytes Relative 38  12 - 46 %   Lymphs Abs 3.2  0.7 - 4.0 K/uL   Monocytes Relative 7  3 - 12 %   Monocytes Absolute 0.6  0.1 - 1.0 K/uL   Eosinophils Relative 3  0 - 5 %   Eosinophils Absolute 0.3  0.0 - 0.7 K/uL   Basophils Relative 0  0 - 1 %   Basophils Absolute 0.0  0.0 - 0.1 K/uL  COMPREHENSIVE METABOLIC PANEL     Status: Abnormal   Collection Time   06/04/12 11:04 PM      Component Value Range   Sodium 137  135 - 145 mEq/L   Potassium 3.9  3.5 - 5.1 mEq/L   Chloride 102  96 - 112 mEq/L   CO2 26  19 - 32 mEq/L   Glucose, Bld 92  70 - 99 mg/dL   BUN 10  6 - 23 mg/dL   Creatinine, Ser 4.09 (*) 0.50 - 1.10 mg/dL   Calcium 9.2  8.4 - 81.1 mg/dL   Total Protein 6.0  6.0 - 8.3 g/dL   Albumin 3.9  3.5 - 5.2 g/dL   AST 13  0 - 37 U/L   ALT 10  0 - 35 U/L   Alkaline Phosphatase 86  39 - 117 U/L   Total Bilirubin 0.3  0.3 - 1.2 mg/dL   GFR calc non Af Amer >90  >90 mL/min   GFR calc Af Amer >90  >90 mL/min   Patient continues to press call bell each time the RN leaves the room. She is very upset, anxious and states her pain is severe after phenergan and dilaudid.   Assessment:  36 y.o. female with abdominal pain, nausea and  vomiting  Plan:  IV LR @ 125 cc/hr   Dilaudid 0.5 mg IV   Phenergan 12.5 mg IV   Ativan 1 mg IV  MAU Course: @ 11:30 pm Consult with Dr. Marcelle Overlie and he will write admission orders.  Procedures  NEESE,HOPE, RN, FNP, [facility] 06/04/2012, 10:25 PM

## 2012-06-04 NOTE — MAU Note (Signed)
Pt was seen at [facility] last Thursday and gave her a Rx for percocet that she did not get filled b/c she was addicted years ago and is afraid she will become addicted again. Pt took 2 vicodin today and she says it did not help.  She states she would like to be admitted over night so she can see Dr. Jennette Kettle in the morning.

## 2012-06-04 NOTE — MAU Note (Signed)
Pt reports she has been having abd pain for 3 weeks, was seen in ER 3 days ago and has appointment with Jennette Kettle next week but pain worsened tonight.

## 2012-06-05 ENCOUNTER — Observation Stay (HOSPITAL_COMMUNITY)
Admission: AD | Admit: 2012-06-05 | Discharge: 2012-06-06 | Disposition: A | Discharge status: Home / Self Care | Payer: 59 | Source: Ambulatory Visit | Attending: Obstetrics & Gynecology | Admitting: Obstetrics & Gynecology

## 2012-06-05 DIAGNOSIS — R109 Unspecified abdominal pain: Secondary | ICD-10-CM | POA: Insufficient documentation

## 2012-06-05 DIAGNOSIS — J309 Allergic rhinitis, unspecified: Secondary | ICD-10-CM

## 2012-06-05 DIAGNOSIS — R630 Anorexia: Secondary | ICD-10-CM

## 2012-06-05 DIAGNOSIS — F17200 Nicotine dependence, unspecified, uncomplicated: Secondary | ICD-10-CM

## 2012-06-05 DIAGNOSIS — J984 Other disorders of lung: Secondary | ICD-10-CM

## 2012-06-05 DIAGNOSIS — R5381 Other malaise: Secondary | ICD-10-CM

## 2012-06-05 DIAGNOSIS — K089 Disorder of teeth and supporting structures, unspecified: Secondary | ICD-10-CM

## 2012-06-05 DIAGNOSIS — F410 Panic disorder [episodic paroxysmal anxiety] without agoraphobia: Secondary | ICD-10-CM

## 2012-06-05 DIAGNOSIS — K222 Esophageal obstruction: Secondary | ICD-10-CM

## 2012-06-05 DIAGNOSIS — R1310 Dysphagia, unspecified: Secondary | ICD-10-CM

## 2012-06-05 DIAGNOSIS — N949 Unspecified condition associated with female genital organs and menstrual cycle: Secondary | ICD-10-CM | POA: Insufficient documentation

## 2012-06-05 DIAGNOSIS — M546 Pain in thoracic spine: Secondary | ICD-10-CM

## 2012-06-05 DIAGNOSIS — F411 Generalized anxiety disorder: Secondary | ICD-10-CM

## 2012-06-05 DIAGNOSIS — L819 Disorder of pigmentation, unspecified: Secondary | ICD-10-CM

## 2012-06-05 DIAGNOSIS — A630 Anogenital (venereal) warts: Secondary | ICD-10-CM

## 2012-06-05 DIAGNOSIS — R55 Syncope and collapse: Principal | ICD-10-CM | POA: Insufficient documentation

## 2012-06-05 DIAGNOSIS — R002 Palpitations: Secondary | ICD-10-CM

## 2012-06-05 DIAGNOSIS — M5412 Radiculopathy, cervical region: Secondary | ICD-10-CM

## 2012-06-05 DIAGNOSIS — K5900 Constipation, unspecified: Secondary | ICD-10-CM

## 2012-06-05 DIAGNOSIS — K439 Ventral hernia without obstruction or gangrene: Secondary | ICD-10-CM

## 2012-06-05 DIAGNOSIS — N809 Endometriosis, unspecified: Secondary | ICD-10-CM

## 2012-06-05 DIAGNOSIS — F4001 Agoraphobia with panic disorder: Secondary | ICD-10-CM

## 2012-06-05 DIAGNOSIS — F341 Dysthymic disorder: Secondary | ICD-10-CM

## 2012-06-05 DIAGNOSIS — B009 Herpesviral infection, unspecified: Secondary | ICD-10-CM

## 2012-06-05 DIAGNOSIS — K219 Gastro-esophageal reflux disease without esophagitis: Secondary | ICD-10-CM

## 2012-06-05 LAB — URINALYSIS, ROUTINE W REFLEX MICROSCOPIC
Bilirubin Urine: NEGATIVE
Glucose, UA: NEGATIVE mg/dL
Ketones, ur: NEGATIVE mg/dL
Leukocytes, UA: NEGATIVE
Nitrite: NEGATIVE
Specific Gravity, Urine: 1.01 (ref 1.005–1.030)
pH: 6 (ref 5.0–8.0)

## 2012-06-05 LAB — COMPREHENSIVE METABOLIC PANEL
ALT: 9 U/L (ref 0–35)
Albumin: 3.4 g/dL — ABNORMAL LOW (ref 3.5–5.2)
Alkaline Phosphatase: 70 U/L (ref 39–117)
Calcium: 8.8 mg/dL (ref 8.4–10.5)
GFR calc Af Amer: >90 mL/min (ref >90)
Potassium: 3.8 mEq/L (ref 3.5–5.1)
Sodium: 141 mEq/L (ref 135–145)
Total Protein: 5.4 g/dL — ABNORMAL LOW (ref 6.0–8.3)

## 2012-06-05 LAB — CBC
MCH: 32.4 pg (ref 26.0–34.0)
MCHC: 33.9 g/dL (ref 30.0–36.0)
RDW: 12.1 % (ref 11.5–15.5)

## 2012-06-05 MED ORDER — DIPHENHYDRAMINE HCL 50 MG/ML IJ SOLN: 12.5000 mg | Freq: Four times a day (QID) | INTRAMUSCULAR | Status: DC | PRN

## 2012-06-05 MED ORDER — LORAZEPAM 2 MG/ML IJ SOLN
1.0000 mg | Freq: Four times a day (QID) | INTRAMUSCULAR | Status: DC | PRN
  Administered 2012-06-05 – 2012-06-06 (×2): via INTRAVENOUS
  Administered 2012-06-06: 1 mg via INTRAVENOUS
  Filled 2012-06-05 (×3): qty 1

## 2012-06-05 MED ORDER — ONDANSETRON HCL 4 MG/2ML IJ SOLN
4.0000 mg | Freq: Four times a day (QID) | INTRAMUSCULAR | Status: DC | PRN
Start: 2012-06-05 — End: 2012-06-05

## 2012-06-05 MED ORDER — KETOROLAC TROMETHAMINE 30 MG/ML IJ SOLN
30.0000 mg | Freq: Four times a day (QID) | INTRAMUSCULAR | Status: DC
  Administered 2012-06-05 (×2): 30 mg via INTRAVENOUS

## 2012-06-05 MED ORDER — KETOROLAC TROMETHAMINE 30 MG/ML IJ SOLN
30.0000 mg | Freq: Once | INTRAMUSCULAR | Status: DC
  Filled 2012-06-05 (×2): qty 1

## 2012-06-05 MED ORDER — NALOXONE HCL 0.4 MG/ML IJ SOLN: 0.4000 mg | INTRAMUSCULAR | Status: DC | PRN

## 2012-06-05 MED ORDER — DEXTROSE IN LACTATED RINGERS 5 % IV SOLN: INTRAVENOUS | Status: DC

## 2012-06-05 MED ORDER — MORPHINE SULFATE (PF) 1 MG/ML IV SOLN: INTRAVENOUS | Status: DC

## 2012-06-05 MED ORDER — LACTATED RINGERS IV SOLN: INTRAVENOUS | Status: DC

## 2012-06-05 MED ORDER — ONDANSETRON HCL 4 MG/2ML IJ SOLN: 4.0000 mg | Freq: Four times a day (QID) | INTRAMUSCULAR | Status: DC | PRN

## 2012-06-05 MED ORDER — DIPHENHYDRAMINE HCL 12.5 MG/5ML PO ELIX: 12.5000 mg | ORAL_SOLUTION | Freq: Four times a day (QID) | ORAL | Status: DC | PRN

## 2012-06-05 MED ORDER — PROMETHAZINE HCL 25 MG/ML IJ SOLN
12.5000 mg | Freq: Four times a day (QID) | INTRAMUSCULAR | Status: DC | PRN
  Administered 2012-06-05 (×2): 12.5 mg via INTRAVENOUS
  Administered 2012-06-06: 05:00:00 via INTRAVENOUS
  Administered 2012-06-06: 12.5 mg via INTRAVENOUS
  Filled 2012-06-05 (×4): qty 1

## 2012-06-05 MED ORDER — HYDROMORPHONE HCL 2 MG PO TABS: 2.0000 mg | ORAL_TABLET | Freq: Two times a day (BID) | ORAL | Status: DC | PRN

## 2012-06-05 MED ORDER — MORPHINE SULFATE (PF) 1 MG/ML IV SOLN
INTRAVENOUS | Status: DC
  Filled 2012-06-05: qty 25

## 2012-06-05 MED ORDER — DEXTROSE-NACL 5-0.45 % IV SOLN
INTRAVENOUS | Status: DC
  Administered 2012-06-05 – 2012-06-06 (×2): via INTRAVENOUS

## 2012-06-05 MED ORDER — ESCITALOPRAM OXALATE 10 MG PO TABS: 20.0000 mg | ORAL_TABLET | Freq: Every day | ORAL | Status: DC

## 2012-06-05 MED ORDER — HYDROMORPHONE 0.3 MG/ML IV SOLN
INTRAVENOUS | Status: DC
  Administered 2012-06-05 (×2): via INTRAVENOUS
  Administered 2012-06-05: 4.5 mg via INTRAVENOUS
  Filled 2012-06-05 (×2): qty 25

## 2012-06-05 MED ORDER — LORAZEPAM 1 MG PO TABS
1.0000 mg | ORAL_TABLET | Freq: Once | ORAL | Status: AC
  Administered 2012-06-05: 1 mg via ORAL
  Filled 2012-06-05: qty 1

## 2012-06-05 MED ORDER — ONDANSETRON HCL 4 MG PO TABS: 4.0000 mg | ORAL_TABLET | Freq: Four times a day (QID) | ORAL | Status: DC | PRN

## 2012-06-05 MED ORDER — KETOROLAC TROMETHAMINE 30 MG/ML IJ SOLN: 30.0000 mg | Freq: Four times a day (QID) | INTRAMUSCULAR | Status: DC

## 2012-06-05 MED ORDER — MENTHOL 3 MG MT LOZG: 1.0000 | LOZENGE | OROMUCOSAL | Status: DC | PRN

## 2012-06-05 MED ORDER — NALOXONE HCL 0.4 MG/ML IJ SOLN
0.4000 mg | INTRAMUSCULAR | Status: DC | PRN
  Administered 2012-06-05: 0.4 mg via INTRAVENOUS
  Filled 2012-06-05: qty 1

## 2012-06-05 MED ORDER — SODIUM CHLORIDE 0.9 % IJ SOLN: 9.0000 mL | INTRAMUSCULAR | Status: DC | PRN

## 2012-06-05 MED ORDER — ACETAMINOPHEN 325 MG PO TABS: 325.0000 mg | ORAL_TABLET | Freq: Four times a day (QID) | ORAL | Status: DC | PRN

## 2012-06-05 MED ORDER — LORAZEPAM 2 MG/ML IJ SOLN: 1.0000 mg | Freq: Once | INTRAMUSCULAR | Status: AC

## 2012-06-05 MED ORDER — HYDROMORPHONE 0.3 MG/ML IV SOLN
INTRAVENOUS | Status: DC
  Administered 2012-06-05: 20:00:00 via INTRAVENOUS
  Administered 2012-06-05: 6 mg via INTRAVENOUS
  Administered 2012-06-06: 05:00:00 via INTRAVENOUS
  Filled 2012-06-05 (×2): qty 25

## 2012-06-05 NOTE — Progress Notes (Signed)
While giving ativan, pt requesting more pain meds &  Wanting to know when she could get upstairs to get medicine on the pump.  Pt was screaming; informed pt that we do not do pain pumps down here in maternity admissions and that I was doing the best I could for her.  She stated," Why are you so mean and snappy? I am a patient too. I reassured her that I realized that and I was trying to get her a room upstairs so she could get her pain pump.

## 2012-06-05 NOTE — Progress Notes (Signed)
Post discharge ur review completed. 

## 2012-06-05 NOTE — Progress Notes (Signed)
Patient ID: Gina [name], female   DOB: Mar 03, 1976, 36 y.o.   MRN: [id number] S: c/o pain lower mid abdomen and low back pain. Admits to h/o endometriosis, and fibromyalgia O: VSS, afebrile ABd soft , no rebound tenderness + BS A: abd pain uncertain eitology P: DC home per Dr. Jennette Kettle to follow -up in the office

## 2012-06-05 NOTE — Progress Notes (Signed)
Patient woke up when phlebotomist came for labs and started begging in tears for meds.  " I need my ativan or phenergan for my anxiety."    Dr, Marcelle Overlie called and updated.  No new orders made and informed this nurse the patient's needs will be addressed during rounds.

## 2012-06-06 NOTE — Progress Notes (Signed)
Pt. Returned at 1430 per strecth.Dr. Jennette Kettle office aware .States she passed out at Dr. Isidore Moos Admitting B/p 88/55,p 73. Dr Jennette Kettle was notified of pt. Return. Orders to follow. Stated she needs her medication.

## 2012-06-06 NOTE — Progress Notes (Signed)
Patient states..please unhook my iv and let me go outside for just a few minutes. i explained to patient that i couldn't do that because it is against hospital policy. Patient is crying stated no one cares about me. Talked with patient about her home life and where she lived and if she had support at home. Patient states she has a husband and 3 children but did not want to talk anymore. Request ativan to help her sleep. Ativan 1mg  given iv. Will continue to monitor.

## 2012-06-06 NOTE — Progress Notes (Signed)
Pt was discharged in the care of Mother. Pt does not want to go home"."".I need my pain medication" "need ativan.phenergan and my Dilaudid. Pt is very drowsy and speech is slurred. Gait is  Unsteady. Discharged instruction given to pt and Mother. Questions were asked and answered.the patient was discharged per wheel chair..Stable. I

## 2012-06-06 NOTE — Progress Notes (Signed)
UR Chart review completed.  

## 2012-06-06 NOTE — Progress Notes (Signed)
Patient is heavily sedated. She is arousable but her words are slurred and she is confused. She will request more ativan and phenergan when nurse goes into room. Her vital signs are stable and O2 sats are 100%. Will continue to monitor.

## 2012-06-06 NOTE — Progress Notes (Signed)
Pt is discharged in the care of father.. Still experiencing periods of slurred speech , and remains drowsy. Downstair per wheelchair. Stable.No noted  States pain is still present but she is able to tolerate it.

## 2012-06-07 NOTE — H&P (Signed)
NAME:  Gina [name], Gina [name] NO.:  [id number]  MEDICAL RECORD NO.:  [id number]  LOCATION:  9309                          FACILITY:  [facility]  PHYSICIAN:  Freddy Finner, M.D.   DATE OF BIRTH:  08-11-1976  DATE OF ADMISSION:  06/05/2012 DATE OF DISCHARGE:  06/06/2012                             HISTORY & PHYSICAL   DISCHARGE SUMMARY/READMISSION NOTE  HISTORY OF PRESENT ILLNESS:  The patient is a 36 year old white married female, gravida 3, para 2, who has a long history of chronic pelvic and abdominal pain, and was admitted by Dr. Richarda Overlie on June 04, 2012, for pain management.  The patient was discharged home from the hospital on the morning of June 05, 2012, and within less than 1 hour, presented to my office, where a syncopal episode recurred, and she was readmitted to the hospital.  Gina [name]'s history of pelvic and abdominal pain dates back to approximately 10 years prior to this admission. During that interval of time, she has had numerous surgical procedures. She has had complete GI evaluation at [facility] in [place].  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  By her history, the onset of her symptoms started on June 01, 2012, and she presented to the emergency facility of [facility] on [address] in [place], where a CT of her abdomen and pelvis was obtained.  The results of that were relieved very nonspecific and without any obvious significant abnormality.  There was a suggestion of colitis, but this was a very cursory call by the radiologist.  Certainly, she has had no symptoms of colitis.  After her readmission, her physical exam to my examination, she is somnolent, well-nourished, well-developed white female, who is reclined peacefully, but arousable.  Her blood pressure was 84/56.  Her pulse  rate was 76 and regular.  Her O2 sat on room air was 98%.  At the time of my visit, she did have Phenergan on board.  To auscultation, her chest was clear throughout.  Heart exam revealed normal sinus rhythm without murmurs, rubs, or gallops.  The abdomen was soft and mildly tender and perhaps slightly distended.  Bowel sounds were normal throughout.  There was no rebound tenderness.  The patient has previously had a hysterectomy.  Pelvic exam was not performed at this time.  Her extremities were normal without cyanosis, clubbing, or edema.  PAST MEDICAL HISTORY:  As noted above, it is significant.  She has 2 children, both delivered by cesarean, one in 1997 and one in 2002.  Over the intervening year, she has had a total of approximately 8 abdominal surgeries, one was a laparoscopically assisted vaginal hysterectomy, which I have performed.  She has subsequently had 2 additional diagnostic laparoscopies for recurrent pelvic pain with findings of minimal endometriosis and/or adhesions.  She had an abdominal wall hernia repair which has been revised on 2 more additional occasions with mesh placed in her anterior abdominal wall.  She states that she has a hiatal hernia.  She has irritable bowel syndrome, and that she has arthritis.  MEDICATIONS:  Her current medications include: 1. Lexapro 20 mg a day. 2. Flexeril 10 mg t.i.d. p.r.n. 3. She has Klonopin which she takes 2 mg b.i.d. as needed.  There is some question about other medication use with some difficulty in obtaining a complete history of medication use.  ALLERGIES:  Her known allergies to medications are LAMICTAL, ZOFRAN, PENICILLIN, CHANTIX, SKELAXIN, and NICOTINE REPLACEMENT PATCHES.  SOCIAL HISTORY:  She is a 1 pack per day cigarette smoker.  Her primary care physician is Stacie Glaze, MD.  The patient's last office visit with me was in August 2012, following her last laparoscopy, at which time there was minimal  evidence of endometriosis which was laparoscopically fulgurated and the adhesions which were lysed.  I am uncertain of the chronology of her visits with other surgical specialist since that time, but I think I have included a visit with Dr. Johna Sheriff who repaired a tear and then the abdominal wall mesh.  Also, she, I think during that interval, was evaluated in [place], and found have a Schatzki ring in the esophagus, which was treated.  After the physical assessment and review of her history, we have elected to readmit her to the hospital overnight for further pain management. Careful consultation with patient and her mother has been carried out, indicating the complexity of her pain syndrome.  There is also clearly psychogenic component to lot of her dysfunction and responses to this. I am going to continue with this night because it is just a continuation.  Her discharge in the morning of June 06, 2012, after no other eventful episodes during the night.  Using a Dilaudid PCA pump, and using a mg of Ativan IV on 2-3 occasions and Phenergan as needed for nausea, the patient had remained stable.  Her vital signs had remained stable.  She was afebrile.  Her physical exam was unchanged.  Her state of alertness was improved, and she was discharged home with Dilaudid 2- mg tablets to be taken 1 every 4 hours as needed for pain.  She has her other medications which she is continuing at home including Lexapro and Klonopin, and she is to follow up in my office in approximately 1 week. She is to resume a regular diet as tolerated.  She is to ambulate as possible.     Freddy Finner, M.D.     WRN/MEDQ  D:  06/06/2012  T:  06/07/2012  Job:  [id number]

## 2012-06-19 ENCOUNTER — Telehealth: Payer: Self-pay | Admitting: Internal Medicine

## 2012-06-19 NOTE — Telephone Encounter (Signed)
Dr Lovell Sheehan is aware and agrees with her going to [facility] last night

## 2012-06-19 NOTE — Telephone Encounter (Signed)
[facility] Triage Call Report Triage Record Num: [id number] Operator: Ether Griffins Patient Name: Kobe Jansma Call Date & Time: 06/17/2012 8:56:00PM Patient Phone: [phone] PCP: [name] Patient Gender: Female PCP Fax : [phone] Patient DOB: [birth year] [facility] Name[facility] Reason for Call: Caller: Drenda/Patient; PCP: [name]; CB#: [phone]; Calling about used an enema and is having abdominal pain & pressure,nausea,vomiting,felt really hot--had BM but still feels like she needs to go more. Onset 06/17/12. Feels better now--abd pain not as bad, no more vomiting and not feeling hot. Has IBS and colon was inflammed 2 weeks ago--was in hospital and was told not to use enemas. Guideline: Abdominal Pain. Disposition: See Provider Within 24 Hours. Advised to go to UC in am. Home care advice given. Also advised to follow up with office on Monday am. Protocol(s) Used: Abdominal Pain Recommended Outcome per Protocol: See Provider within 24 hours Reason for Outcome: New onset constant mild or aching lower abdominal pain Care Advice: ~ Soak in warm bath or place heating pad set on low on abdomen to aid in pain relief. ~ Rest as much as possible. Call provider immediately if pain gets worse, localized to one spot or lasts more than 4 hours and prevents normal activity; tell provider if vomiting begins after onset of pain or if having any fever. ~ ~ SYMPTOM / CONDITION MANAGEMENT

## 2012-06-19 NOTE — Telephone Encounter (Signed)
Dr jenkins is aware 

## 2012-07-12 ENCOUNTER — Emergency Department (HOSPITAL_BASED_OUTPATIENT_CLINIC_OR_DEPARTMENT_OTHER)
Admission: EM | Admit: 2012-07-12 | Discharge: 2012-07-13 | Disposition: A | Discharge status: Home / Self Care | Payer: 59 | Attending: Emergency Medicine | Admitting: Emergency Medicine

## 2012-07-12 ENCOUNTER — Encounter (HOSPITAL_BASED_OUTPATIENT_CLINIC_OR_DEPARTMENT_OTHER): Payer: Self-pay | Admitting: *Deleted

## 2012-07-12 DIAGNOSIS — R109 Unspecified abdominal pain: Secondary | ICD-10-CM | POA: Insufficient documentation

## 2012-07-12 DIAGNOSIS — G8929 Other chronic pain: Secondary | ICD-10-CM | POA: Insufficient documentation

## 2012-07-12 DIAGNOSIS — Z818 Family history of other mental and behavioral disorders: Secondary | ICD-10-CM | POA: Insufficient documentation

## 2012-07-12 DIAGNOSIS — F17200 Nicotine dependence, unspecified, uncomplicated: Secondary | ICD-10-CM | POA: Insufficient documentation

## 2012-07-12 DIAGNOSIS — F411 Generalized anxiety disorder: Secondary | ICD-10-CM | POA: Insufficient documentation

## 2012-07-12 DIAGNOSIS — Z8261 Family history of arthritis: Secondary | ICD-10-CM | POA: Insufficient documentation

## 2012-07-12 DIAGNOSIS — Z880 Allergy status to penicillin: Secondary | ICD-10-CM | POA: Insufficient documentation

## 2012-07-12 DIAGNOSIS — Z8249 Family history of ischemic heart disease and other diseases of the circulatory system: Secondary | ICD-10-CM | POA: Insufficient documentation

## 2012-07-12 DIAGNOSIS — Z8489 Family history of other specified conditions: Secondary | ICD-10-CM | POA: Insufficient documentation

## 2012-07-12 DIAGNOSIS — Z888 Allergy status to other drugs, medicaments and biological substances status: Secondary | ICD-10-CM | POA: Insufficient documentation

## 2012-07-12 DIAGNOSIS — M069 Rheumatoid arthritis, unspecified: Secondary | ICD-10-CM | POA: Insufficient documentation

## 2012-07-12 DIAGNOSIS — K5900 Constipation, unspecified: Secondary | ICD-10-CM | POA: Insufficient documentation

## 2012-07-12 DIAGNOSIS — K219 Gastro-esophageal reflux disease without esophagitis: Secondary | ICD-10-CM | POA: Insufficient documentation

## 2012-07-12 LAB — COMPREHENSIVE METABOLIC PANEL
AST: 13 U/L (ref 0–37)
Albumin: 4.1 g/dL (ref 3.5–5.2)
Calcium: 9.3 mg/dL (ref 8.4–10.5)
Chloride: 104 mEq/L (ref 96–112)
Creatinine, Ser: 0.5 mg/dL (ref 0.50–1.10)
Sodium: 139 mEq/L (ref 135–145)
Total Bilirubin: 0.1 mg/dL — ABNORMAL LOW (ref 0.3–1.2)

## 2012-07-12 LAB — CBC WITH DIFFERENTIAL/PLATELET
Basophils Absolute: 0 10*3/uL (ref 0.0–0.1)
Basophils Relative: 0 % (ref 0–1)
HCT: 37.1 % (ref 36.0–46.0)
MCHC: 35.3 g/dL (ref 30.0–36.0)
Monocytes Absolute: 0.6 10*3/uL (ref 0.1–1.0)
Neutro Abs: 4.2 10*3/uL (ref 1.7–7.7)
Platelets: 165 10*3/uL (ref 150–400)
RDW: 11.5 % (ref 11.5–15.5)

## 2012-07-12 LAB — URINALYSIS, ROUTINE W REFLEX MICROSCOPIC
Glucose, UA: NEGATIVE mg/dL
Ketones, ur: NEGATIVE mg/dL
Leukocytes, UA: NEGATIVE
Nitrite: NEGATIVE
Protein, ur: NEGATIVE mg/dL
Urobilinogen, UA: 0.2 mg/dL (ref 0.0–1.0)

## 2012-07-12 MED ORDER — HYDROMORPHONE HCL PF 1 MG/ML IJ SOLN
1.0000 mg | Freq: Once | INTRAMUSCULAR | Status: AC
  Administered 2012-07-12: 1 mg via INTRAVENOUS
  Filled 2012-07-12: qty 1

## 2012-07-12 MED ORDER — PROMETHAZINE HCL 25 MG/ML IJ SOLN
12.5000 mg | Freq: Once | INTRAMUSCULAR | Status: AC
  Administered 2012-07-13: 12.5 mg via INTRAVENOUS
  Filled 2012-07-12: qty 1

## 2012-07-12 MED ORDER — LORAZEPAM 2 MG/ML IJ SOLN
0.5000 mg | Freq: Once | INTRAMUSCULAR | Status: AC
  Administered 2012-07-12: 0.5 mg via INTRAVENOUS
  Filled 2012-07-12: qty 1

## 2012-07-12 MED ORDER — SODIUM CHLORIDE 0.9 % IV BOLUS (SEPSIS)
1000.0000 mL | Freq: Once | INTRAVENOUS | Status: AC
  Administered 2012-07-12: 1000 mL via INTRAVENOUS

## 2012-07-12 NOTE — ED Provider Notes (Signed)
History     CSN: [id number]  Arrival date & time 07/12/12  2150   First MD Initiated Contact with Patient 07/12/12 2254      Chief Complaint  Patient presents with  . Abdominal Pain    (Consider location/radiation/quality/duration/timing/severity/associated sxs/prior treatment) Patient is a 36 y.o. female presenting with abdominal pain. The history is provided by the patient and the spouse.  Abdominal Pain The primary symptoms of the illness include abdominal pain, nausea and vomiting. The primary symptoms of the illness do not include fever, fatigue, shortness of breath, diarrhea, dysuria, vaginal discharge or vaginal bleeding. The current episode started yesterday. The onset of the illness was gradual. The problem has not changed since onset. The abdominal pain began yesterday. The pain came on gradually. The abdominal pain has been unchanged since its onset. The abdominal pain is located in the suprapubic region. The abdominal pain does not radiate. The severity of the abdominal pain is 10/10. The abdominal pain is relieved by nothing. The abdominal pain is exacerbated by vomiting.  The vomiting began yesterday. Vomiting occurs 2 to 5 times per day. The emesis contains stomach contents.  Associated with: chronic abdominal pain that she refused pelvic surgery for. The patient states that she believes she is currently not pregnant. Risk factors for an acute abdominal problem include a history of abdominal surgery. Symptoms associated with the illness do not include chills or diaphoresis. Significant associated medical issues do not include inflammatory bowel disease.    Past Medical History  Diagnosis Date  . ABDOMINAL WALL HERNIA 02/27/2010  . ALLERGIC RHINITIS 06/02/2007  . ANOREXIA, CHRONIC 08/07/2008  . ANXIETY 09/15/2010  . BACK PAIN, THORACIC REGION 07/07/2007  . BACK PAIN, THORACIC REGION 07/07/2007  . CERVICAL RADICULOPATHY 12/11/2008  . Condyloma acuminatum 04/23/2009  . CONSTIPATION  09/15/2010  . DYSPHAGIA UNSPECIFIED 09/09/2009  . 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 ENDOMETRIOSIS 12/16/2009  . FATIGUE 06/11/2010  . GERD 10/23/2009  . HERPES SIMPLEX INFECTION 06/11/2010  . LENTIGO 04/23/2009  . LUNG NODULE 06/17/2010  . Palpitations 10/23/2009  . 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 Panic disorder without agoraphobia 08/07/2008  . Stricture and stenosis of esophagus 05/13/2010  . TOBACCO ABUSE 08/07/2008  . Heart palpitations   . Urinary tract infection   . Fibromyalgia   . Rheumatoid arthritis   . Ovarian cyst   . Abnormal Pap smear   . Fibromyalgia     Past Surgical History  Procedure Date  . Partial hysterectomy   . Endometrial ablation   . Hernia repair     X3  . Dilation and curettage of uterus     x1   . Cesarean section     x2   . Esophageal dilatation x 4   . Gum surgery     Family History  Problem Relation Age of Onset  . Depression Mother   . Arthritis Mother   . Hyperlipidemia Father   . Hypertension Father     History  Substance Use Topics  . Smoking status: Current Every Day Smoker -- 1.0 packs/day    Types: Cigarettes  . Smokeless tobacco: Never Used  . Alcohol Use: No    OB History    Grav Para Term Preterm Abortions TAB SAB Ect Mult Living   3 2 2  0 1 0 1 0 0 2      Review of Systems  Constitutional: Negative for fever, chills, diaphoresis and fatigue.  Respiratory: Negative for cough and shortness of breath.  Cardiovascular: Negative for chest pain and leg swelling.  Gastrointestinal: Positive for nausea, vomiting and abdominal pain. Negative for diarrhea and rectal pain.  Genitourinary: Negative for dysuria, vaginal bleeding and vaginal discharge.  All other systems reviewed and are negative.    Allergies  Lamictal; Ondansetron hcl; Zofran; Chantix; Nicotine; Skelaxin; and Penicillins  Home Medications   Current Outpatient Rx  Name Route Sig Dispense Refill  . ACETAMINOPHEN 325 MG PO TABS Oral Take 325 mg  by mouth every 6 (six) hours as needed. For pain    . CLONAZEPAM 2 MG PO TABS Oral Take 1 mg by mouth 4 (four) times daily.    . CYCLOBENZAPRINE HCL 10 MG PO TABS Oral Take 10 mg by mouth 3 (three) times daily as needed. For muscle spasms    . ESCITALOPRAM OXALATE 10 MG PO TABS Oral Take 2 tablets (20 mg total) by mouth daily. 30 tablet 0  . HYDROMORPHONE HCL 2 MG PO TABS Oral Take 1 tablet (2 mg total) by mouth every 12 (twelve) hours as needed for pain. 20 tablet 0  . MELATIN PO Oral Take 1 tablet by mouth at bedtime as needed. For sleep      BP 108/73  Pulse 90  Temp 99 F (37.2 C) (Oral)  Resp 20  Ht 5\' 7"  (1.702 m)  Wt 140 lb (63.504 kg)  BMI 21.93 kg/m2  SpO2 98%  Physical Exam  Constitutional: She is oriented to person, place, and time. She appears well-developed and well-nourished. No distress.  HENT:  Head: Normocephalic and atraumatic.  Mouth/Throat: Oropharynx is clear and moist.  Eyes: Conjunctivae normal are normal. Pupils are equal, round, and reactive to light.  Neck: Normal range of motion. Neck supple.  Cardiovascular: Normal rate and regular rhythm.   Pulmonary/Chest: Effort normal and breath sounds normal. She has no wheezes. She has no rales.  Abdominal: Soft. Bowel sounds are normal. There is tenderness in the suprapubic area. There is no rigidity, no rebound, no guarding, no tenderness at McBurney's point and negative Murphy's sign.  Musculoskeletal: Normal range of motion.  Neurological: She is alert and oriented to person, place, and time.  Skin: Skin is warm and dry.  Psychiatric: She has a normal mood and affect.    ED Course  Procedures (including critical care time)   Labs Reviewed  URINALYSIS, ROUTINE W REFLEX MICROSCOPIC  CBC WITH DIFFERENTIAL  COMPREHENSIVE METABOLIC PANEL  LIPASE, BLOOD   No results found.   No diagnosis found.    MDM  No indications for CT at this time.  Patient pain free at rest and tolerating PO well.  Must follow  up with her own doctor and pain management for ongoing care        Natarsha Hurwitz K Dangela How-Rasch, MD 07/13/12 1610

## 2012-07-12 NOTE — ED Notes (Signed)
Pt having abd pain since yesterday that has not subsided even after taking PO dilaudid. Pt has chronic pain and abdominal issues.

## 2012-07-13 ENCOUNTER — Emergency Department (HOSPITAL_BASED_OUTPATIENT_CLINIC_OR_DEPARTMENT_OTHER): Payer: 59

## 2012-07-13 MED ORDER — KETOROLAC TROMETHAMINE 30 MG/ML IJ SOLN
30.0000 mg | Freq: Once | INTRAMUSCULAR | Status: AC
  Administered 2012-07-13: 30 mg via INTRAVENOUS
  Filled 2012-07-13: qty 1

## 2012-07-13 MED ORDER — PROMETHAZINE HCL 25 MG RE SUPP: 12.5000 mg | Freq: Four times a day (QID) | RECTAL | Status: DC | PRN

## 2012-07-13 MED ORDER — HYDROMORPHONE HCL PF 1 MG/ML IJ SOLN
INTRAMUSCULAR | Status: AC
  Administered 2012-07-13: 1 mg via INTRAVENOUS
  Filled 2012-07-13: qty 1

## 2012-07-13 MED ORDER — HYDROMORPHONE HCL PF 1 MG/ML IJ SOLN
1.0000 mg | Freq: Once | INTRAMUSCULAR | Status: AC
  Administered 2012-07-13: 1 mg via INTRAVENOUS

## 2012-07-13 NOTE — ED Notes (Signed)
Pt requesting additional ativan. MD made aware. No new orders received.

## 2012-07-25 ENCOUNTER — Encounter: Payer: Self-pay | Admitting: Internal Medicine

## 2012-08-02 ENCOUNTER — Telehealth: Payer: Self-pay | Admitting: Internal Medicine

## 2012-08-02 MED ORDER — ESCITALOPRAM OXALATE 10 MG PO TABS: 20.0000 mg | ORAL_TABLET | Freq: Every day | ORAL | Status: DC

## 2012-08-02 MED ORDER — CLONAZEPAM 2 MG PO TABS: 1.0000 mg | ORAL_TABLET | Freq: Four times a day (QID) | ORAL | Status: DC

## 2012-08-02 NOTE — Telephone Encounter (Signed)
Patient called stating that she need a refill of her klonipin and her lexapro 20mg  1qd. Please assist. Her pharmacy is [facility].

## 2012-08-02 NOTE — Telephone Encounter (Signed)
done

## 2012-08-04 ENCOUNTER — Telehealth: Payer: Self-pay | Admitting: Family Medicine

## 2012-08-04 MED ORDER — ESCITALOPRAM OXALATE 20 MG PO TABS: 20.0000 mg | ORAL_TABLET | Freq: Every day | ORAL | Status: DC

## 2012-08-04 NOTE — Telephone Encounter (Signed)
She states she takes 20 mg tabs,but I sent in 20 mg tabs to pharmacy

## 2012-08-04 NOTE — Telephone Encounter (Signed)
[employer] covers only 1 tablet of Lexapro daily. I see 10mg  qd and 10mg  BID in pt's chart. Please advise if it is QD or if she can go to 20mg  tab 1Qd, as I don't think a Prior Auth will extend [facility] coverage of 1 tab/per day. I will try, though, if needed.

## 2012-08-08 ENCOUNTER — Emergency Department (HOSPITAL_COMMUNITY)
Admission: EM | Admit: 2012-08-08 | Discharge: 2012-08-08 | Disposition: A | Discharge status: Home / Self Care | Payer: 59 | Attending: Emergency Medicine | Admitting: Emergency Medicine

## 2012-08-08 ENCOUNTER — Encounter (HOSPITAL_COMMUNITY): Payer: Self-pay | Admitting: *Deleted

## 2012-08-08 DIAGNOSIS — R109 Unspecified abdominal pain: Secondary | ICD-10-CM | POA: Insufficient documentation

## 2012-08-08 DIAGNOSIS — R112 Nausea with vomiting, unspecified: Secondary | ICD-10-CM | POA: Insufficient documentation

## 2012-08-08 LAB — COMPREHENSIVE METABOLIC PANEL
ALT: 9 U/L (ref 0–35)
AST: 12 U/L (ref 0–37)
Albumin: 3.9 g/dL (ref 3.5–5.2)
Calcium: 9 mg/dL (ref 8.4–10.5)
GFR calc Af Amer: >90 mL/min (ref >90)
Glucose, Bld: 75 mg/dL (ref 70–99)
Sodium: 138 mEq/L (ref 135–145)
Total Protein: 6.5 g/dL (ref 6.0–8.3)

## 2012-08-08 LAB — CBC WITH DIFFERENTIAL/PLATELET
Basophils Absolute: 0 10*3/uL (ref 0.0–0.1)
Basophils Relative: 0 % (ref 0–1)
Eosinophils Absolute: 0.4 10*3/uL (ref 0.0–0.7)
Eosinophils Relative: 4 % (ref 0–5)
MCH: 32.1 pg (ref 26.0–34.0)
MCV: 92.6 fL (ref 78.0–100.0)
Platelets: 222 10*3/uL (ref 150–400)
RDW: 11.9 % (ref 11.5–15.5)

## 2012-08-08 LAB — GLUCOSE, CAPILLARY: Glucose-Capillary: 80 mg/dL (ref 70–99)

## 2012-08-08 NOTE — ED Notes (Addendum)
Pt wants to be signed out AMA. Pt has been advised about risks and treatments. mutliple nurses have talked to pt about the risks and benefits. Pt still adamant that she does not want to wait to be seen and that her grandmother is coming to pick her up.  Pt walked to desk to sign herself out and walked out of triage into lobby.

## 2012-08-08 NOTE — ED Notes (Addendum)
ems reports pt has n/v that started today. Pt had laproscopic surgery to cut out endometriosis 2 weeks ago. Pt reports weakness. Pt has medications with her that include dilaudid and phenergan.   Upon rn assessment. Pt had surgery 12 days ago. Now has lower abdominal/ pelvic pain 10/10. Pt drove for the first time today and said she overdid it. When she got back had nausea and vomitting

## 2012-08-17 ENCOUNTER — Emergency Department (HOSPITAL_BASED_OUTPATIENT_CLINIC_OR_DEPARTMENT_OTHER)
Admission: EM | Admit: 2012-08-17 | Discharge: 2012-08-17 | Disposition: A | Discharge status: Home / Self Care | Payer: 59 | Attending: Emergency Medicine | Admitting: Emergency Medicine

## 2012-08-17 ENCOUNTER — Encounter (HOSPITAL_BASED_OUTPATIENT_CLINIC_OR_DEPARTMENT_OTHER): Payer: Self-pay

## 2012-08-17 DIAGNOSIS — IMO0001 Reserved for inherently not codable concepts without codable children: Secondary | ICD-10-CM | POA: Insufficient documentation

## 2012-08-17 DIAGNOSIS — F17200 Nicotine dependence, unspecified, uncomplicated: Secondary | ICD-10-CM | POA: Insufficient documentation

## 2012-08-17 DIAGNOSIS — K219 Gastro-esophageal reflux disease without esophagitis: Secondary | ICD-10-CM | POA: Insufficient documentation

## 2012-08-17 DIAGNOSIS — M069 Rheumatoid arthritis, unspecified: Secondary | ICD-10-CM | POA: Insufficient documentation

## 2012-08-17 DIAGNOSIS — N949 Unspecified condition associated with female genital organs and menstrual cycle: Secondary | ICD-10-CM | POA: Insufficient documentation

## 2012-08-17 DIAGNOSIS — R102 Pelvic and perineal pain: Secondary | ICD-10-CM

## 2012-08-17 LAB — COMPREHENSIVE METABOLIC PANEL
Alkaline Phosphatase: 71 U/L (ref 39–117)
BUN: 8 mg/dL (ref 6–23)
Chloride: 103 mEq/L (ref 96–112)
GFR calc Af Amer: >90 mL/min (ref >90)
GFR calc non Af Amer: >90 mL/min (ref >90)
Glucose, Bld: 88 mg/dL (ref 70–99)
Potassium: 4.2 mEq/L (ref 3.5–5.1)
Total Bilirubin: 0.3 mg/dL (ref 0.3–1.2)
Total Protein: 6.5 g/dL (ref 6.0–8.3)

## 2012-08-17 LAB — URINALYSIS, ROUTINE W REFLEX MICROSCOPIC
Ketones, ur: NEGATIVE mg/dL
Leukocytes, UA: NEGATIVE
Nitrite: NEGATIVE
Protein, ur: NEGATIVE mg/dL
Urobilinogen, UA: 0.2 mg/dL (ref 0.0–1.0)

## 2012-08-17 LAB — CBC WITH DIFFERENTIAL/PLATELET
Eosinophils Absolute: 0.5 10*3/uL (ref 0.0–0.7)
Hemoglobin: 13.7 g/dL (ref 12.0–15.0)
Lymphs Abs: 2.9 10*3/uL (ref 0.7–4.0)
MCH: 32.2 pg (ref 26.0–34.0)
Monocytes Relative: 8 % (ref 3–12)
Neutrophils Relative %: 46 % (ref 43–77)
RBC: 4.26 MIL/uL (ref 3.87–5.11)

## 2012-08-17 MED ORDER — LORAZEPAM 1 MG PO TABS
1.0000 mg | ORAL_TABLET | Freq: Once | ORAL | Status: AC
  Administered 2012-08-17: 1 mg via ORAL
  Filled 2012-08-17: qty 1

## 2012-08-17 MED ORDER — SODIUM CHLORIDE 0.9 % IV BOLUS (SEPSIS)
1000.0000 mL | Freq: Once | INTRAVENOUS | Status: AC
  Administered 2012-08-17: 1000 mL via INTRAVENOUS

## 2012-08-17 MED ORDER — PROMETHAZINE HCL 25 MG/ML IJ SOLN
12.5000 mg | Freq: Once | INTRAMUSCULAR | Status: AC
  Administered 2012-08-17: 12.5 mg via INTRAVENOUS
  Filled 2012-08-17: qty 1

## 2012-08-17 MED ORDER — HYDROMORPHONE HCL PF 1 MG/ML IJ SOLN
1.0000 mg | Freq: Once | INTRAMUSCULAR | Status: AC
  Administered 2012-08-17: 1 mg via INTRAVENOUS
  Filled 2012-08-17: qty 1

## 2012-08-17 NOTE — ED Provider Notes (Signed)
History     CSN: [id number]  Arrival date & time 08/17/12  1931   First MD Initiated Contact with Patient 08/17/12 2106      Chief Complaint  Patient presents with  . Loss of Consciousness    (Consider location/radiation/quality/duration/timing/severity/associated sxs/prior treatment) HPI Comments: PT with a hx of chronic abdominal and pelvic pain s/p laproscopic surgery 3 weeks ago for endometriosis, presents with lower abd pain.  States that it has been hurting since the surgery.  She thinks that she has overdone it with housework and driving the last couple of days.  She felt weak and says that her legs gave out and she had a brief syncopal episode.  Denies any injury from the syncope.  Says that her dog stepped on her abdomen and made it hurt worse.  Has had n/v which she has had frequently in the past.  No f/c.  No urinary symptoms.  Patient is a 36 y.o. female presenting with syncope. The history is provided by the patient.  Loss of Consciousness Associated symptoms include abdominal pain. Pertinent negatives include no chest pain, no headaches and no shortness of breath.    Past Medical History  Diagnosis Date  . ABDOMINAL WALL HERNIA 02/27/2010  . ALLERGIC RHINITIS 06/02/2007  . ANOREXIA, CHRONIC 08/07/2008  . ANXIETY 09/15/2010  . BACK PAIN, THORACIC REGION 07/07/2007  . BACK PAIN, THORACIC REGION 07/07/2007  . CERVICAL RADICULOPATHY 12/11/2008  . Condyloma acuminatum 04/23/2009  . CONSTIPATION 09/15/2010  . DYSPHAGIA UNSPECIFIED 09/09/2009  . 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 ENDOMETRIOSIS 12/16/2009  . FATIGUE 06/11/2010  . GERD 10/23/2009  . HERPES SIMPLEX INFECTION 06/11/2010  . LENTIGO 04/23/2009  . LUNG NODULE 06/17/2010  . Palpitations 10/23/2009  . 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 Panic disorder without agoraphobia 08/07/2008  . Stricture and stenosis of esophagus 05/13/2010  . TOBACCO ABUSE 08/07/2008  . Heart palpitations   . Urinary tract infection   .  Fibromyalgia   . Rheumatoid arthritis   . Ovarian cyst   . Abnormal Pap smear   . Fibromyalgia     Past Surgical History  Procedure Date  . Partial hysterectomy   . Endometrial ablation   . Hernia repair     X3  . Dilation and curettage of uterus     x1   . Cesarean section     x2   . Esophageal dilatation x 4   . Gum surgery   . Abdominal surgery     Family History  Problem Relation Age of Onset  . Depression Mother   . Arthritis Mother   . Hyperlipidemia Father   . Hypertension Father     History  Substance Use Topics  . Smoking status: Current Every Day Smoker -- 1.0 packs/day    Types: Cigarettes  . Smokeless tobacco: Never Used  . Alcohol Use: No    OB History    Grav Para Term Preterm Abortions TAB SAB Ect Mult Living   3 2 2  0 1 0 1 0 0 2      Review of Systems  Constitutional: Positive for fatigue. Negative for fever, chills and diaphoresis.  HENT: Negative for congestion, rhinorrhea and sneezing.   Eyes: Negative.   Respiratory: Negative for cough, chest tightness and shortness of breath.   Cardiovascular: Positive for syncope. Negative for chest pain and leg swelling.  Gastrointestinal: Positive for nausea, vomiting and abdominal pain. Negative for diarrhea and blood in stool.  Genitourinary: Positive for pelvic pain.  Negative for frequency, hematuria, flank pain, vaginal bleeding, vaginal discharge and difficulty urinating.  Musculoskeletal: Negative for back pain and arthralgias.  Skin: Negative for rash.  Neurological: Positive for syncope. Negative for dizziness, speech difficulty, weakness, numbness and headaches.    Allergies  Lamictal; Ondansetron hcl; Zofran; Chantix; Nicotine; Skelaxin; and Penicillins  Home Medications   Current Outpatient Rx  Name Route Sig Dispense Refill  . VALIUM PO Oral Take by mouth.    . ACETAMINOPHEN 325 MG PO TABS Oral Take 325 mg by mouth every 6 (six) hours as needed. For pain    . CLONAZEPAM 2 MG PO  TABS Oral Take 0.5 tablets (1 mg total) by mouth 4 (four) times daily. 60 tablet 3  . CYCLOBENZAPRINE HCL 10 MG PO TABS Oral Take 10 mg by mouth 3 (three) times daily as needed. For muscle spasms    . ESCITALOPRAM OXALATE 20 MG PO TABS Oral Take 1 tablet (20 mg total) by mouth daily. 90 tablet 3  . HYDROMORPHONE HCL 2 MG PO TABS Oral Take 1 tablet (2 mg total) by mouth every 12 (twelve) hours as needed for pain. 20 tablet 0  . MELATIN PO Oral Take 1 tablet by mouth at bedtime as needed. For sleep    . PROMETHAZINE HCL 25 MG RE SUPP Rectal Place 0.5 suppositories (12.5 mg total) rectally every 6 (six) hours as needed for nausea. 3 each 0    BP 106/64  Pulse 96  Temp 99 F (37.2 C)  Resp 16  Ht 5\' 7"  (1.702 m)  Wt 147 lb (66.679 kg)  BMI 23.02 kg/m2  SpO2 99%  Physical Exam  Constitutional: She is oriented to person, place, and time. She appears well-developed and well-nourished.  HENT:  Head: Normocephalic and atraumatic.  Eyes: Pupils are equal, round, and reactive to light.  Neck: Normal range of motion. Neck supple.  Cardiovascular: Normal rate, regular rhythm and normal heart sounds.   Pulmonary/Chest: Effort normal and breath sounds normal. No respiratory distress. She has no wheezes. She has no rales. She exhibits no tenderness.  Abdominal: Soft. Bowel sounds are normal. There is tenderness (moderate TTP across lower abdomen.  no distension). There is no rebound and no guarding.  Musculoskeletal: Normal range of motion. She exhibits no edema.  Lymphadenopathy:    She has no cervical adenopathy.  Neurological: She is alert and oriented to person, place, and time.  Skin: Skin is warm and dry. No rash noted.  Psychiatric: She has a normal mood and affect.    ED Course  Procedures (including critical care time) Results for orders placed during the hospital encounter of 08/17/12  CBC WITH DIFFERENTIAL      Component Value Range   WBC 7.6  4.0 - 10.5 K/uL   RBC 4.26  3.87 -  5.11 MIL/uL   Hemoglobin 13.7  12.0 - 15.0 g/dL   HCT 19.1  47.8 - 29.5 %   MCV 93.0  78.0 - 100.0 fL   MCH 32.2  26.0 - 34.0 pg   MCHC 34.6  30.0 - 36.0 g/dL   RDW 62.1  30.8 - 65.7 %   Platelets 197  150 - 400 K/uL   Neutrophils Relative 46  43 - 77 %   Neutro Abs 3.5  1.7 - 7.7 K/uL   Lymphocytes Relative 39  12 - 46 %   Lymphs Abs 2.9  0.7 - 4.0 K/uL   Monocytes Relative 8  3 - 12 %  Monocytes Absolute 0.6  0.1 - 1.0 K/uL   Eosinophils Relative 7 (*) 0 - 5 %   Eosinophils Absolute 0.5  0.0 - 0.7 K/uL   Basophils Relative 0  0 - 1 %   Basophils Absolute 0.0  0.0 - 0.1 K/uL  COMPREHENSIVE METABOLIC PANEL      Component Value Range   Sodium 140  135 - 145 mEq/L   Potassium 4.2  3.5 - 5.1 mEq/L   Chloride 103  96 - 112 mEq/L   CO2 27  19 - 32 mEq/L   Glucose, Bld 88  70 - 99 mg/dL   BUN 8  6 - 23 mg/dL   Creatinine, Ser 8.46  0.50 - 1.10 mg/dL   Calcium 9.1  8.4 - 96.2 mg/dL   Total Protein 6.5  6.0 - 8.3 g/dL   Albumin 4.0  3.5 - 5.2 g/dL   AST 13  0 - 37 U/L   ALT 7  0 - 35 U/L   Alkaline Phosphatase 71  39 - 117 U/L   Total Bilirubin 0.3  0.3 - 1.2 mg/dL   GFR calc non Af Amer >90  >90 mL/min   GFR calc Af Amer >90  >90 mL/min  URINALYSIS, ROUTINE W REFLEX MICROSCOPIC      Component Value Range   Color, Urine YELLOW  YELLOW   APPearance CLEAR  CLEAR   Specific Gravity, Urine 1.019  1.005 - 1.030   pH 7.0  5.0 - 8.0   Glucose, UA NEGATIVE  NEGATIVE mg/dL   Hgb urine dipstick NEGATIVE  NEGATIVE   Bilirubin Urine NEGATIVE  NEGATIVE   Ketones, ur NEGATIVE  NEGATIVE mg/dL   Protein, ur NEGATIVE  NEGATIVE mg/dL   Urobilinogen, UA 0.2  0.0 - 1.0 mg/dL   Nitrite NEGATIVE  NEGATIVE   Leukocytes, UA NEGATIVE  NEGATIVE   No results found.    1. Pelvic pain in female       MDM  Pt treated for pain here.  I feel that this is more of a chronic issue.  There does not appear to be any concern for acute abdomen/surgical complication.  Advised to f/u with her ob/gyn for  reevaluation        Rolan Bucco, MD 08/17/12 2338

## 2012-08-17 NOTE — ED Notes (Signed)
Mother called and request that we not let pt drive and to not let her child drive because she is underage, mother states pts father is in route to drive them home

## 2012-08-17 NOTE — ED Notes (Signed)
C/o abd pain, n/v, syncopal episode today-pt recalls feeling weak 'my legs give out"-woke on the floor in her home-surgery 3 weeks ago for endometriosis

## 2012-08-17 NOTE — ED Notes (Signed)
Pt refused w/c , father here to drive pt home

## 2012-08-22 ENCOUNTER — Encounter (HOSPITAL_COMMUNITY): Payer: Self-pay | Admitting: *Deleted

## 2012-08-22 ENCOUNTER — Telehealth: Payer: Self-pay | Admitting: Internal Medicine

## 2012-08-22 DIAGNOSIS — Z8742 Personal history of other diseases of the female genital tract: Secondary | ICD-10-CM | POA: Insufficient documentation

## 2012-08-22 DIAGNOSIS — R002 Palpitations: Secondary | ICD-10-CM | POA: Insufficient documentation

## 2012-08-22 DIAGNOSIS — Z87440 Personal history of urinary (tract) infections: Secondary | ICD-10-CM | POA: Insufficient documentation

## 2012-08-22 DIAGNOSIS — K219 Gastro-esophageal reflux disease without esophagitis: Secondary | ICD-10-CM | POA: Insufficient documentation

## 2012-08-22 DIAGNOSIS — Z8739 Personal history of other diseases of the musculoskeletal system and connective tissue: Secondary | ICD-10-CM | POA: Insufficient documentation

## 2012-08-22 DIAGNOSIS — R1033 Periumbilical pain: Secondary | ICD-10-CM | POA: Insufficient documentation

## 2012-08-22 DIAGNOSIS — F411 Generalized anxiety disorder: Secondary | ICD-10-CM | POA: Insufficient documentation

## 2012-08-22 DIAGNOSIS — F17200 Nicotine dependence, unspecified, uncomplicated: Secondary | ICD-10-CM | POA: Insufficient documentation

## 2012-08-22 DIAGNOSIS — Z8619 Personal history of other infectious and parasitic diseases: Secondary | ICD-10-CM | POA: Insufficient documentation

## 2012-08-22 DIAGNOSIS — Z79899 Other long term (current) drug therapy: Secondary | ICD-10-CM | POA: Insufficient documentation

## 2012-08-22 DIAGNOSIS — R630 Anorexia: Secondary | ICD-10-CM | POA: Insufficient documentation

## 2012-08-22 DIAGNOSIS — Z8719 Personal history of other diseases of the digestive system: Secondary | ICD-10-CM | POA: Insufficient documentation

## 2012-08-22 DIAGNOSIS — R1110 Vomiting, unspecified: Secondary | ICD-10-CM | POA: Insufficient documentation

## 2012-08-22 NOTE — Telephone Encounter (Signed)
Pt called req ov with Dr Lovell Sheehan only. Pt says that she is not feeling good and refuses to see another doctor.

## 2012-08-22 NOTE — ED Notes (Signed)
Pt to ED c/o lower abd pain that started last night, but increased at 1300 after she had a big bm.  She states, "I had to push really hard and remove the stool with my hands.  After that it started hurting really bad".  Pt with hx of 15 laproscopic surgeries for hernia ruptures and endometriosis.  Last surgery was 1 month ago.  Pt now dry heaving. Daughter is in pediatrics at this time.

## 2012-08-22 NOTE — Telephone Encounter (Signed)
Pt is sch for 08-31-2012 11:45

## 2012-08-22 NOTE — Telephone Encounter (Signed)
May have the 31 if ralph cancels

## 2012-08-23 ENCOUNTER — Emergency Department (HOSPITAL_COMMUNITY)
Admission: EM | Admit: 2012-08-23 | Discharge: 2012-08-23 | Disposition: A | Discharge status: Home / Self Care | Payer: 59 | Attending: Emergency Medicine | Admitting: Emergency Medicine

## 2012-08-23 ENCOUNTER — Emergency Department (HOSPITAL_COMMUNITY): Payer: 59

## 2012-08-23 DIAGNOSIS — F411 Generalized anxiety disorder: Secondary | ICD-10-CM

## 2012-08-23 DIAGNOSIS — R109 Unspecified abdominal pain: Secondary | ICD-10-CM

## 2012-08-23 LAB — CBC WITH DIFFERENTIAL/PLATELET
Basophils Absolute: 0 10*3/uL (ref 0.0–0.1)
Basophils Relative: 0 % (ref 0–1)
Eosinophils Absolute: 0.5 10*3/uL (ref 0.0–0.7)
MCHC: 35 g/dL (ref 30.0–36.0)
Neutro Abs: 4.3 10*3/uL (ref 1.7–7.7)
Neutrophils Relative %: 47 % (ref 43–77)
RDW: 12 % (ref 11.5–15.5)

## 2012-08-23 LAB — COMPREHENSIVE METABOLIC PANEL
AST: 14 U/L (ref 0–37)
Albumin: 4 g/dL (ref 3.5–5.2)
Chloride: 103 mEq/L (ref 96–112)
Creatinine, Ser: 0.48 mg/dL — ABNORMAL LOW (ref 0.50–1.10)
Potassium: 3.7 mEq/L (ref 3.5–5.1)
Total Bilirubin: 0.2 mg/dL — ABNORMAL LOW (ref 0.3–1.2)
Total Protein: 6.7 g/dL (ref 6.0–8.3)

## 2012-08-23 MED ORDER — ACETAMINOPHEN-CODEINE #3 300-30 MG PO TABS
1.0000 | ORAL_TABLET | Freq: Once | ORAL | Status: AC
  Administered 2012-08-23: 1 via ORAL
  Filled 2012-08-23: qty 1

## 2012-08-23 MED ORDER — ACETAMINOPHEN-CODEINE #3 300-30 MG PO TABS: 1.0000 | ORAL_TABLET | Freq: Four times a day (QID) | ORAL | Status: DC | PRN

## 2012-08-23 MED ORDER — LORAZEPAM 2 MG/ML IJ SOLN
1.0000 mg | Freq: Once | INTRAMUSCULAR | Status: AC
  Administered 2012-08-23: 1 mg via INTRAVENOUS
  Filled 2012-08-23: qty 1

## 2012-08-23 MED ORDER — PROMETHAZINE HCL 25 MG/ML IJ SOLN
25.0000 mg | Freq: Once | INTRAMUSCULAR | Status: AC
  Administered 2012-08-23: 25 mg via INTRAVENOUS
  Filled 2012-08-23: qty 1

## 2012-08-23 MED ORDER — SODIUM CHLORIDE 0.9 % IV BOLUS (SEPSIS)
1000.0000 mL | Freq: Once | INTRAVENOUS | Status: AC
  Administered 2012-08-23: 1000 mL via INTRAVENOUS

## 2012-08-23 MED ORDER — HYDROMORPHONE HCL PF 1 MG/ML IJ SOLN
1.0000 mg | Freq: Once | INTRAMUSCULAR | Status: AC
  Administered 2012-08-23: 1 mg via INTRAVENOUS
  Filled 2012-08-23: qty 1

## 2012-08-23 MED ORDER — FENTANYL CITRATE 0.05 MG/ML IJ SOLN: 50.0000 ug | Freq: Once | INTRAMUSCULAR | Status: DC

## 2012-08-23 MED ORDER — HYDROMORPHONE HCL PF 1 MG/ML IJ SOLN
1.0000 mg | Freq: Once | INTRAMUSCULAR | Status: AC
  Administered 2012-08-23: 1 mg via INTRAMUSCULAR
  Filled 2012-08-23: qty 1

## 2012-08-23 NOTE — ED Provider Notes (Addendum)
History     CSN: [id number]  Arrival date & time 08/22/12  2332   First MD Initiated Contact with Patient 08/23/12 0104      Chief Complaint  Patient presents with  . Abdominal Pain  . Emesis    (Consider location/radiation/quality/duration/timing/severity/associated sxs/prior treatment) HPI Comments: Patient with chronic abdominal pain now with worse pain at umbilicus assocaited with N/v Has taken her normal pain medications and antiemetics without relief   Patient is a 36 y.o. female presenting with abdominal pain and vomiting. The history is provided by the patient.  Abdominal Pain The primary symptoms of the illness include abdominal pain, nausea and vomiting. The current episode started 3 to 5 hours ago. The onset of the illness was gradual.  The patient states that she believes she is currently not pregnant. The patient has not had a change in bowel habit.  Emesis  Associated symptoms include abdominal pain.    Past Medical History  Diagnosis Date  . ABDOMINAL WALL HERNIA 02/27/2010  . ALLERGIC RHINITIS 06/02/2007  . ANOREXIA, CHRONIC 08/07/2008  . ANXIETY 09/15/2010  . BACK PAIN, THORACIC REGION 07/07/2007  . BACK PAIN, THORACIC REGION 07/07/2007  . CERVICAL RADICULOPATHY 12/11/2008  . Condyloma acuminatum 04/23/2009  . CONSTIPATION 09/15/2010  . DYSPHAGIA UNSPECIFIED 09/09/2009  . 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 ENDOMETRIOSIS 12/16/2009  . FATIGUE 06/11/2010  . GERD 10/23/2009  . HERPES SIMPLEX INFECTION 06/11/2010  . LENTIGO 04/23/2009  . LUNG NODULE 06/17/2010  . Palpitations 10/23/2009  . 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 Panic disorder without agoraphobia 08/07/2008  . Stricture and stenosis of esophagus 05/13/2010  . TOBACCO ABUSE 08/07/2008  . Heart palpitations   . Urinary tract infection   . Fibromyalgia   . Rheumatoid arthritis   . Ovarian cyst   . Abnormal Pap smear   . Fibromyalgia     Past Surgical History  Procedure Date  . Partial  hysterectomy   . Endometrial ablation   . Hernia repair     X3  . Dilation and curettage of uterus     x1   . Cesarean section     x2   . Esophageal dilatation x 4   . Gum surgery   . Abdominal surgery     Family History  Problem Relation Age of Onset  . Depression Mother   . Arthritis Mother   . Hyperlipidemia Father   . Hypertension Father     History  Substance Use Topics  . Smoking status: Current Every Day Smoker -- 1.0 packs/day    Types: Cigarettes  . Smokeless tobacco: Never Used  . Alcohol Use: No    OB History    Grav Para Term Preterm Abortions TAB SAB Ect Mult Living   3 2 2  0 1 0 1 0 0 2      Review of Systems  Gastrointestinal: Positive for nausea, vomiting and abdominal pain.    Allergies  Lamictal; Ondansetron hcl; Zofran; Chantix; Nicotine; Skelaxin; and Penicillins  Home Medications   Current Outpatient Rx  Name Route Sig Dispense Refill  . CLONAZEPAM 2 MG PO TABS Oral Take 0.5 tablets (1 mg total) by mouth 4 (four) times daily. 60 tablet 3  . ESCITALOPRAM OXALATE 20 MG PO TABS Oral Take 1 tablet (20 mg total) by mouth daily. 90 tablet 3  . HYDROMORPHONE HCL 2 MG PO TABS Oral Take 1 tablet (2 mg total) by mouth every 12 (twelve) hours as needed for pain. 20 tablet 0  .  MELATIN PO Oral Take 1 tablet by mouth at bedtime as needed. For sleep    . ACETAMINOPHEN 325 MG PO TABS Oral Take 325 mg by mouth every 6 (six) hours as needed. For pain    . ACETAMINOPHEN-CODEINE #3 300-30 MG PO TABS Oral Take 1 tablet by mouth every 6 (six) hours as needed for pain. 30 tablet 0  . CYCLOBENZAPRINE HCL 10 MG PO TABS Oral Take 10 mg by mouth 3 (three) times daily as needed. For muscle spasms    . PROMETHAZINE HCL 25 MG RE SUPP Rectal Place 0.5 suppositories (12.5 mg total) rectally every 6 (six) hours as needed for nausea. 3 each 0    BP 95/67  Pulse 65  Temp 99.2 F (37.3 C) (Oral)  Resp 20  SpO2 98%  Physical Exam  Constitutional: She is oriented to  person, place, and time. She appears well-developed.  HENT:  Head: Normocephalic.  Eyes: Pupils are equal, round, and reactive to light.  Neck: Normal range of motion.  Pulmonary/Chest: Effort normal.  Abdominal: Soft. Bowel sounds are normal. She exhibits no distension. There is no tenderness. There is no rebound and no guarding.  Musculoskeletal: Normal range of motion.  Neurological: She is alert and oriented to person, place, and time.  Skin: Skin is warm. No rash noted.    ED Course  Procedures (including critical care time)  Labs Reviewed  COMPREHENSIVE METABOLIC PANEL - Abnormal; Notable for the following:    Creatinine, Ser 0.48 (*)     Total Bilirubin 0.2 (*)     All other components within normal limits  CBC WITH DIFFERENTIAL  URINALYSIS, ROUTINE W REFLEX MICROSCOPIC   Dg Abd Acute W/chest  08/23/2012  *RADIOLOGY REPORT*  Clinical Data: Abdominal pain  ACUTE ABDOMEN SERIES (ABDOMEN 2 VIEW & CHEST 1 VIEW)  Comparison: 07/13/2012  Findings: Lungs are clear.  Cardiomediastinal contours within normal limits.  No free intraperitoneal air.  Nonobstructive bowel gas pattern. Mesh coils project over the midline pelvis.  Organ outlines normal where seen.  No acute osseous finding.  Gentle rightward curvature of the lumbar spine.  IMPRESSION: Nonobstructive bowel gas pattern.   Original Report Authenticated By: Waneta Martins, M.D.      1. Abdominal pain   2. ANXIETY       MDM  Patient is very anxious and afraid  that the pain will return but expressing desire not to take narcotics as longer  States she has bee to several pain clinics but always treated like a criminal She does have an appointment with her PCP 08/31/2012         Arman Filter, NP 08/23/12 0346  Arman Filter, NP 09/05/12 2148

## 2012-08-23 NOTE — ED Provider Notes (Signed)
Medical screening examination/treatment/procedure(s) were conducted as a shared visit with non-physician practitioner(s) and myself.  I personally evaluated the patient during the encounter  Cheri Guppy, MD 08/23/12 [phone]

## 2012-08-31 ENCOUNTER — Ambulatory Visit: Payer: 59 | Admitting: Internal Medicine

## 2012-08-31 ENCOUNTER — Telehealth: Payer: Self-pay | Admitting: Internal Medicine

## 2012-08-31 NOTE — Telephone Encounter (Signed)
error 

## 2012-09-01 ENCOUNTER — Ambulatory Visit (INDEPENDENT_AMBULATORY_CARE_PROVIDER_SITE_OTHER): Payer: 59 | Admitting: Internal Medicine

## 2012-09-01 VITALS — BP 100/70 | HR 72 | Temp 98.2°F | Resp 16 | Ht 67.0 in

## 2012-09-01 DIAGNOSIS — G8929 Other chronic pain: Secondary | ICD-10-CM

## 2012-09-01 DIAGNOSIS — R1084 Generalized abdominal pain: Secondary | ICD-10-CM

## 2012-09-01 MED ORDER — BUPRENORPHINE 20 MCG/HR TD PTWK: 20.0000 ug | MEDICATED_PATCH | TRANSDERMAL | Status: DC

## 2012-09-01 MED ORDER — HYDROMORPHONE HCL 2 MG PO TABS: 2.0000 mg | ORAL_TABLET | Freq: Four times a day (QID) | ORAL | Status: DC | PRN

## 2012-09-01 NOTE — Progress Notes (Signed)
Subjective:    Patient ID: Gina [name], female    DOB: August 25, 1976, 36 y.o.   MRN: [id number]  HPI  SHe has been in the ER for pelvic and abdominal pain 4-5 times She  Had surgery by GYN for endometriosis. Date of surgery September. ( has been on dilaudid and tylenol from surgeon. Prior to the surgery she had noted increased pain  With an acute flair after her dog hit her in the abd. Continued to follow with Dr Lloyd Huger until surgery was scheduled. After surgery she noted  Pulling in abdomin wall after surgery. Not getting better and believes that she is worse after surgery.... Feels like something is pulling inside. Anxiety of loss of surgeon and Daughter is in and out of behavioral health.       Review of Systems  Constitutional: Negative for activity change, appetite change and fatigue.  HENT: Negative for ear pain, congestion, neck pain, postnasal drip and sinus pressure.   Eyes: Negative for redness and visual disturbance.  Respiratory: Negative for cough, shortness of breath and wheezing.   Gastrointestinal: Positive for abdominal pain and abdominal distention.  Genitourinary: Positive for pelvic pain. Negative for dysuria, frequency and menstrual problem.  Musculoskeletal: Negative for myalgias, joint swelling and arthralgias.  Skin: Negative for rash and wound.  Neurological: Negative for dizziness, weakness and headaches.  Hematological: Negative for adenopathy. Does not bruise/bleed easily.  Psychiatric/Behavioral: Negative for disturbed wake/sleep cycle and decreased concentration.       Objective:   Physical Exam  Nursing note and vitals reviewed. Constitutional: She is oriented to person, place, and time. She appears well-developed and well-nourished. No distress.  HENT:  Head: Normocephalic and atraumatic.  Right Ear: External ear normal.  Left Ear: External ear normal.  Nose: Nose normal.  Mouth/Throat: Oropharynx is clear and moist.  Eyes: Conjunctivae normal  and EOM are normal. Pupils are equal, round, and reactive to light.  Neck: Normal range of motion. Neck supple. No JVD present. No tracheal deviation present. No thyromegaly present.  Cardiovascular: Normal rate, regular rhythm, normal heart sounds and intact distal pulses.   No murmur heard. Pulmonary/Chest: Effort normal and breath sounds normal. She has no wheezes. She exhibits no tenderness.  Abdominal: Soft. Bowel sounds are normal. There is tenderness. There is rebound and guarding.  Musculoskeletal: Normal range of motion. She exhibits no edema and no tenderness.  Lymphadenopathy:    She has no cervical adenopathy.  Neurological: She is alert and oriented to person, place, and time. She has normal reflexes. No cranial nerve deficit.  Skin: Skin is warm and dry. She is not diaphoretic.  Psychiatric: She has a normal mood and affect. Her behavior is normal.          Assessment & Plan:  The patient is on dilaudid for pain and plans to start a new job on Monday and fears that she will be able to control pain and work. The patient has been told by her surgeon that she need a rheumatologist. She wants surgery... She wants a cure and she feels that the mesh in her abdomin is defective. Chronic pain control. It is difficult to differentiate the physcological pain form the physiologic pain She was drug free from April to August? She was on neurontin for three weeks in detox center in [place] She may need to consider  Fellowship [name] She refuses to consider that she is addicted to pain medications . E. to a limited prescription of Dilaudid in talking to  him to a pain clinic [facility] that has a neurologist at this pain work I think this will be important step in understanding etiology of her pain and understanding affective control of her pain

## 2012-09-01 NOTE — Patient Instructions (Signed)
The patient is instructed to continue all medications as prescribed. Schedule followup with check out clerk upon leaving the clinic  

## 2012-09-07 ENCOUNTER — Telehealth: Payer: Self-pay | Admitting: Internal Medicine

## 2012-09-07 ENCOUNTER — Ambulatory Visit: Payer: 59 | Admitting: Internal Medicine

## 2012-09-07 DIAGNOSIS — G8929 Other chronic pain: Secondary | ICD-10-CM

## 2012-09-07 MED ORDER — HYDROMORPHONE HCL 2 MG PO TABS: 2.0000 mg | ORAL_TABLET | Freq: Four times a day (QID) | ORAL | Status: DC | PRN

## 2012-09-07 NOTE — Telephone Encounter (Signed)
Done- may refill on 09-09-12 written on script

## 2012-09-07 NOTE — Telephone Encounter (Signed)
Pt called and stated that she will be out of medication in 4 days and is requesting a refill. Pt is aware that she has been referred to a pain clinic and that there is a process that the clinic has to go through before she can be scheduled. Pt is concerned that if she runs out of medication that she will wind up in the hospital. Pt is also aware that as soon as her records are reviewed by the pain clinic that she will be contacted directly to schedule the appt. Please contact pt

## 2012-09-08 NOTE — ED Provider Notes (Signed)
Medical screening examination/treatment/procedure(s) were performed by non-physician practitioner and as supervising physician I was immediately available for consultation/collaboration.  Cheri Guppy, MD 09/08/12 1505

## 2012-09-10 ENCOUNTER — Emergency Department (HOSPITAL_BASED_OUTPATIENT_CLINIC_OR_DEPARTMENT_OTHER): Payer: 59

## 2012-09-10 ENCOUNTER — Emergency Department (HOSPITAL_BASED_OUTPATIENT_CLINIC_OR_DEPARTMENT_OTHER)
Admission: EM | Admit: 2012-09-10 | Discharge: 2012-09-11 | Disposition: A | Discharge status: Home / Self Care | Payer: 59 | Attending: Emergency Medicine | Admitting: Emergency Medicine

## 2012-09-10 DIAGNOSIS — K222 Esophageal obstruction: Secondary | ICD-10-CM | POA: Insufficient documentation

## 2012-09-10 DIAGNOSIS — L819 Disorder of pigmentation, unspecified: Secondary | ICD-10-CM | POA: Insufficient documentation

## 2012-09-10 DIAGNOSIS — A630 Anogenital (venereal) warts: Secondary | ICD-10-CM | POA: Insufficient documentation

## 2012-09-10 DIAGNOSIS — R911 Solitary pulmonary nodule: Secondary | ICD-10-CM | POA: Insufficient documentation

## 2012-09-10 DIAGNOSIS — F17200 Nicotine dependence, unspecified, uncomplicated: Secondary | ICD-10-CM | POA: Insufficient documentation

## 2012-09-10 DIAGNOSIS — N809 Endometriosis, unspecified: Secondary | ICD-10-CM | POA: Insufficient documentation

## 2012-09-10 DIAGNOSIS — R109 Unspecified abdominal pain: Secondary | ICD-10-CM

## 2012-09-10 DIAGNOSIS — IMO0001 Reserved for inherently not codable concepts without codable children: Secondary | ICD-10-CM | POA: Insufficient documentation

## 2012-09-10 DIAGNOSIS — K920 Hematemesis: Secondary | ICD-10-CM | POA: Insufficient documentation

## 2012-09-10 DIAGNOSIS — B009 Herpesviral infection, unspecified: Secondary | ICD-10-CM | POA: Insufficient documentation

## 2012-09-10 DIAGNOSIS — F4001 Agoraphobia with panic disorder: Secondary | ICD-10-CM | POA: Insufficient documentation

## 2012-09-10 DIAGNOSIS — Z87440 Personal history of urinary (tract) infections: Secondary | ICD-10-CM | POA: Insufficient documentation

## 2012-09-10 DIAGNOSIS — K219 Gastro-esophageal reflux disease without esophagitis: Secondary | ICD-10-CM | POA: Insufficient documentation

## 2012-09-10 DIAGNOSIS — M069 Rheumatoid arthritis, unspecified: Secondary | ICD-10-CM | POA: Insufficient documentation

## 2012-09-10 DIAGNOSIS — M546 Pain in thoracic spine: Secondary | ICD-10-CM | POA: Insufficient documentation

## 2012-09-10 DIAGNOSIS — R1310 Dysphagia, unspecified: Secondary | ICD-10-CM | POA: Insufficient documentation

## 2012-09-10 DIAGNOSIS — F341 Dysthymic disorder: Secondary | ICD-10-CM | POA: Insufficient documentation

## 2012-09-10 DIAGNOSIS — R630 Anorexia: Secondary | ICD-10-CM | POA: Insufficient documentation

## 2012-09-10 DIAGNOSIS — M5412 Radiculopathy, cervical region: Secondary | ICD-10-CM | POA: Insufficient documentation

## 2012-09-10 DIAGNOSIS — Z79899 Other long term (current) drug therapy: Secondary | ICD-10-CM | POA: Insufficient documentation

## 2012-09-10 DIAGNOSIS — R112 Nausea with vomiting, unspecified: Secondary | ICD-10-CM | POA: Insufficient documentation

## 2012-09-10 DIAGNOSIS — F411 Generalized anxiety disorder: Secondary | ICD-10-CM | POA: Insufficient documentation

## 2012-09-10 LAB — CBC WITH DIFFERENTIAL/PLATELET
Lymphocytes Relative: 45 % (ref 12–46)
Lymphs Abs: 3.7 10*3/uL (ref 0.7–4.0)
Neutro Abs: 3.7 10*3/uL (ref 1.7–7.7)
Neutrophils Relative %: 45 % (ref 43–77)
Platelets: 213 10*3/uL (ref 150–400)
RBC: 4.67 MIL/uL (ref 3.87–5.11)
WBC: 8.3 10*3/uL (ref 4.0–10.5)

## 2012-09-10 LAB — COMPREHENSIVE METABOLIC PANEL
ALT: 8 U/L (ref 0–35)
Alkaline Phosphatase: 91 U/L (ref 39–117)
CO2: 26 mEq/L (ref 19–32)
GFR calc Af Amer: >90 mL/min (ref >90)
GFR calc non Af Amer: >90 mL/min (ref >90)
Glucose, Bld: 91 mg/dL (ref 70–99)
Potassium: 3.7 mEq/L (ref 3.5–5.1)
Sodium: 140 mEq/L (ref 135–145)
Total Protein: 7.4 g/dL (ref 6.0–8.3)

## 2012-09-10 MED ORDER — IOHEXOL 300 MG/ML  SOLN
100.0000 mL | Freq: Once | INTRAMUSCULAR | Status: AC | PRN
  Administered 2012-09-10: 100 mL via INTRAVENOUS

## 2012-09-10 MED ORDER — PROMETHAZINE HCL 25 MG/ML IJ SOLN
12.5000 mg | Freq: Four times a day (QID) | INTRAMUSCULAR | Status: DC | PRN
  Administered 2012-09-10: 12.5 mg via INTRAVENOUS
  Filled 2012-09-10: qty 1

## 2012-09-10 MED ORDER — HYDROMORPHONE HCL PF 2 MG/ML IJ SOLN
2.0000 mg | Freq: Once | INTRAMUSCULAR | Status: AC
  Administered 2012-09-10: 2 mg via INTRAVENOUS
  Filled 2012-09-10: qty 1

## 2012-09-10 MED ORDER — LORAZEPAM 2 MG/ML IJ SOLN
1.0000 mg | Freq: Once | INTRAMUSCULAR | Status: AC
  Administered 2012-09-10: 1 mg via INTRAVENOUS
  Filled 2012-09-10: qty 1

## 2012-09-10 MED ORDER — IOHEXOL 300 MG/ML  SOLN
50.0000 mL | Freq: Once | INTRAMUSCULAR | Status: AC | PRN
  Administered 2012-09-10: 50 mL via ORAL

## 2012-09-10 NOTE — ED Notes (Signed)
Patient to [facility] with assist from father.

## 2012-09-10 NOTE — ED Notes (Signed)
Patient uses [facility] without difficulty. Expresses concerns that she has not had enough medication. Assigned RN made aware.

## 2012-09-10 NOTE — ED Notes (Signed)
Pt requesting name and number of surgeon on call, not just a referral to a group. Pt and family provided with name and number of surgeon on call. Pt escorted out of dept in W/C, NAD noted.

## 2012-09-10 NOTE — ED Notes (Signed)
Pt given PO contrast to drink for CT, family at bs, SR up x2. IV site unremarkable.

## 2012-09-10 NOTE — ED Notes (Signed)
Pt continues to request more time before going to CT, pt wanting more pain meds. Pt made aware that no more meds would be given for pain at this time. Pt anxious and refusing to lay flat. Pt once again made aware that no other meds would be given at this time.

## 2012-09-10 NOTE — ED Notes (Signed)
Pt states that nausea and anxiety are some better but is still in excruciating pain. Pt requesting more pain meds before she can go to CT for imaging. MD made aware.

## 2012-09-10 NOTE — ED Notes (Signed)
Patient screaming and vomiting blood in the lobby, reoports that she has had multiple abdominal surgeries and developed severe p[ain today with hemoptysis

## 2012-09-10 NOTE — ED Provider Notes (Signed)
History    This chart was scribed for Geoffery Lyons, MD, MD by Smitty Pluck, ED Scribe. The patient was seen in room MH10/MH10 and the patient's care was started at 7:49PM.   CSN: [id number]  Arrival date & time 09/10/12  1920    Chief Complaint  Patient presents with  . Abdominal Pain    (Consider location/radiation/quality/duration/timing/severity/associated sxs/prior treatment) The history is provided by the patient. No language interpreter was used.   Gina [name] is a 36 y.o. female who presents to the Emergency Department complaining of constant, severe abdominal pain onset today 6 hours ago. She reports that she had sudden onset when she walked into store. Pt had dilaudid (2mg ) without relief. Pt reports that she has hx of abdominal pain, abdominal surgeries (last surgery 5 weeks ago) and abdominal mesh. Pt reports that she has vomited blood today. Denies fever, chills, diarrhea and any other pain.   Past Medical History  Diagnosis Date  . ABDOMINAL WALL HERNIA 02/27/2010  . ALLERGIC RHINITIS 06/02/2007  . ANOREXIA, CHRONIC 08/07/2008  . ANXIETY 09/15/2010  . BACK PAIN, THORACIC REGION 07/07/2007  . BACK PAIN, THORACIC REGION 07/07/2007  . CERVICAL RADICULOPATHY 12/11/2008  . Condyloma acuminatum 04/23/2009  . CONSTIPATION 09/15/2010  . DYSPHAGIA UNSPECIFIED 09/09/2009  . 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 ENDOMETRIOSIS 12/16/2009  . FATIGUE 06/11/2010  . GERD 10/23/2009  . HERPES SIMPLEX INFECTION 06/11/2010  . LENTIGO 04/23/2009  . LUNG NODULE 06/17/2010  . Palpitations 10/23/2009  . 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 Panic disorder without agoraphobia 08/07/2008  . Stricture and stenosis of esophagus 05/13/2010  . TOBACCO ABUSE 08/07/2008  . Heart palpitations   . Urinary tract infection   . Fibromyalgia   . Rheumatoid arthritis   . Ovarian cyst   . Abnormal Pap smear   . Fibromyalgia     Past Surgical History  Procedure Date  . Partial hysterectomy   .  Endometrial ablation   . Hernia repair     X3  . Dilation and curettage of uterus     x1   . Cesarean section     x2   . Esophageal dilatation x 4   . Gum surgery   . Abdominal surgery     Family History  Problem Relation Age of Onset  . Depression Mother   . Arthritis Mother   . Hyperlipidemia Father   . Hypertension Father     History  Substance Use Topics  . Smoking status: Current Every Day Smoker -- 1.0 packs/day    Types: Cigarettes  . Smokeless tobacco: Never Used  . Alcohol Use: No    OB History    Grav Para Term Preterm Abortions TAB SAB Ect Mult Living   3 2 2  0 1 0 1 0 0 2      Review of Systems  All other systems reviewed and are negative.   10 Systems reviewed and all are negative for acute change except as noted in the HPI.   Allergies  Lamictal; Ondansetron hcl; Zofran; Chantix; Nicotine; Skelaxin; and Penicillins  Home Medications   Current Outpatient Rx  Name  Route  Sig  Dispense  Refill  . ACETAMINOPHEN 325 MG PO TABS   Oral   Take 325 mg by mouth every 6 (six) hours as needed. For pain         . CLONAZEPAM 2 MG PO TABS   Oral   Take 0.5 tablets (1 mg total) by mouth 4 (four)  times daily.   60 tablet   3   . CYCLOBENZAPRINE HCL 10 MG PO TABS   Oral   Take 10 mg by mouth 3 (three) times daily as needed. For muscle spasms         . ESCITALOPRAM OXALATE 20 MG PO TABS   Oral   Take 1 tablet (20 mg total) by mouth daily.   90 tablet   3   . HYDROMORPHONE HCL 2 MG PO TABS   Oral   Take 1 tablet (2 mg total) by mouth every 6 (six) hours as needed for pain.   30 tablet   0   . PROMETHAZINE HCL 25 MG RE SUPP   Rectal   Place 12.5 mg rectally every 6 (six) hours as needed.           BP 137/103  Pulse 79  Temp 98.6 F (37 C) (Oral)  Resp 26  SpO2 95%  Physical Exam  Nursing note and vitals reviewed. Constitutional: She is oriented to person, place, and time. She appears well-developed and well-nourished.  HENT:    Head: Normocephalic and atraumatic.  Eyes: EOM are normal. Pupils are equal, round, and reactive to light.  Neck: Normal range of motion. Neck supple. No tracheal deviation present.  Cardiovascular: Normal rate, regular rhythm and normal heart sounds.   Pulmonary/Chest: Effort normal and breath sounds normal. No respiratory distress.  Abdominal: Soft. She exhibits no distension.  Musculoskeletal: Normal range of motion.  Neurological: She is alert and oriented to person, place, and time.  Skin: Skin is warm and dry.  Psychiatric: She has a normal mood and affect. Her behavior is normal.    ED Course  Procedures (including critical care time) DIAGNOSTIC STUDIES: Oxygen Saturation is 95% on room air, adequate by my interpretation.    COORDINATION OF CARE: 7:52 PM Discussed ED treatment with pt  7:53 PM Ordered:     . [COMPLETED]  HYDROmorphone (DILAUDID) injection  2 mg Intravenous Once  . [COMPLETED] LORazepam  1 mg Intravenous Once        Labs Reviewed  CBC WITH DIFFERENTIAL  COMPREHENSIVE METABOLIC PANEL  LIPASE, BLOOD   No results found.   No diagnosis found.    MDM  The patient presents here with the complaint of abdominal pain.  She has a history of abdominal surgeries in the past and reports she has surgical mesh as "the only thing holding her abdomen together".  She was walking out of a store today when she developed severe abd pain.  She came through triage screaming loudly and carrying on quite dramatically.  She says that she had vomited blood.    She was immediately taken back to exam room 10 where she was seen by me in a prompt fashion.  IV access was established and she was given repeat doses of dilaudid for her pain and the ativan she was requesting for "her nerves".  There was a slight amount of blood in the emesis bag, where this came from I am unsure.  She was not actively bleeding and her vitals were stable.  Labs were ordered as was a ct scan of the  abdomen and pelvis.  The cbc was normal with no elevation of wbc and normal Hb, and the electrolytes were normal as well. The ct scan was performed and showed no intra-abdominal pathology.    I returned to the patient's room to discuss the results of her workup.  When I informed her of  the ct results, she replied "I'm not surprised.  I need an mri.", and insisted that I perform this test.  I explained to her that I could not do this as it was a Sunday evening and there was no mri at our facility.  I told her that this would have to ordered by one of the multiple Surgeons, Gynecologists, Gastroenterologists that she has seen in the past and that are familiar with her case.  I explained to her that the ct scan has ruled out an emergently surgical process and that follow up in the office is appropriate.  She then told me of the many reasons that she no longer sees these physicians.  I told her I could give her the number for [facility] for her to schedule follow up.  She demanded an exact name of who would see her and was given the name of the surgeon who was on call that evening.    In summary, this was an extremely difficult patient encounter.  She has had abdominal surgeries in the past but also has underlying psychiatric issues which I feel played the larger role in tonight's presentation.  She came through triage hyperventilating and screaming hysterically with pain out of proportion with physical, laboratory, and radiographic findings.  She was demanding expensive tests that I did not have the capability or indication to perform and reacted aggressively when I informed her of this.  She was very argumentative and antagonistic toward me throughout the entire encounter and would not settle for anything short of exactly what she felt she needed.  I am certain that she will complain about my care, however, I feel as though I performed an appropriate workup, treated her pain and anxiety  effectively, and made the appropriate follow up for her.  Her father was present in the exam room for most of the encounter and this was witnessed by the nursing staff.       I personally performed the services described in this documentation, which was scribed in my presence. The recorded information has been reviewed and is accurate.        Geoffery Lyons, MD 09/11/12 1011

## 2012-09-10 NOTE — ED Notes (Signed)
Patient transported to CT 

## 2012-09-10 NOTE — ED Notes (Signed)
Patient request use of bsc. Patient states she cannot use her legs to stand. Patient advised that if her legs do not work, that a bedpan may be more appropriate. Patient refuses use of bedpan and is up to bsc with assist from father. She states she needs no additional assistance.

## 2012-09-11 NOTE — ED Notes (Signed)
Patient called back to ED with further complaints and concerns regarding her care. Patient very upset that her pain is now returned and she expected admission. Dr. Judd Lien had extensive conversation with patient and father of patient about all results from visit and discharged patient with surgeon name and number.

## 2012-09-14 ENCOUNTER — Ambulatory Visit (INDEPENDENT_AMBULATORY_CARE_PROVIDER_SITE_OTHER): Payer: 59 | Admitting: General Surgery

## 2012-09-14 ENCOUNTER — Other Ambulatory Visit (INDEPENDENT_AMBULATORY_CARE_PROVIDER_SITE_OTHER): Payer: Self-pay | Admitting: General Surgery

## 2012-09-14 ENCOUNTER — Encounter (INDEPENDENT_AMBULATORY_CARE_PROVIDER_SITE_OTHER): Payer: Self-pay | Admitting: General Surgery

## 2012-09-14 ENCOUNTER — Telehealth (INDEPENDENT_AMBULATORY_CARE_PROVIDER_SITE_OTHER): Payer: Self-pay | Admitting: General Surgery

## 2012-09-14 VITALS — BP 108/62 | HR 76 | Temp 97.5°F | Resp 14 | Ht 67.0 in

## 2012-09-14 DIAGNOSIS — R109 Unspecified abdominal pain: Secondary | ICD-10-CM

## 2012-09-14 NOTE — Progress Notes (Signed)
Subjective:     Patient ID: Gina [name], female   DOB: [birth year], 36 y.o.   MRN: [id number]  HPI The patient is a 36 year old female who was referred by the ED after being seen for abdominal pain. Patient has a very complex surgical history. Patient has been seen multiple times by several surgeons after a ventral hernia. Mesh. Patient also has been seen by GYN doctor, Dr. Lloyd Huger, for endometriosis treatment and laparoscopy and obliteration of endometriosis.   Patient is a very difficult and combative, and she has been in the past and documented per the emergency room, and states she's had minimal help to help her symptomatic pain. He has undergone a recent CT scan which reveals a negative CT scan with negative findings. Patient's mesh is in place and the anterior abdominal wall is without hernia defect.  Review of Systems  Constitutional: Negative.   HENT: Negative.   Eyes: Negative.   Respiratory: Negative.   Cardiovascular: Negative.   Gastrointestinal: Negative.   Neurological: Negative.        Objective:   Physical Exam  Constitutional: She is oriented to person, place, and time. She appears well-developed and well-nourished.  HENT:  Head: Normocephalic and atraumatic.  Eyes: Conjunctivae normal and EOM are normal. Pupils are equal, round, and reactive to light.  Neck: Normal range of motion. Neck supple.  Cardiovascular: Normal rate, regular rhythm and normal heart sounds.   Pulmonary/Chest: Effort normal and breath sounds normal.  Abdominal: Soft. Bowel sounds are normal. There is no splenomegaly or hepatomegaly. No hernia. Hernia confirmed negative in the ventral area.  Musculoskeletal: Normal range of motion.  Neurological: She is oriented to person, place, and time.       Assessment:     The patient is a 36 year old female with chronic abdominal pain. Based on her CT scan there is no objective findings that is related to her hernia repair and/or mesh. Patient has seen Dr.  Ishmael Holter GYN for her endometriosis which has taken to the operating room for multiple laparoscopies and obliteration of her endometriosis. At this point to not see any organic problem that could be causing her abdominal pain. Patient will like at this point to see Dr. Daphine Deutscher, and be referred to the [facility] health system for further consultation.    Plan:     Will have the patient follow up with Dr. Daphine Deutscher. Will also have peripheral sent to [facility] system along with the patient's record. Patient hospital records would have to be obtained by the patient herself.

## 2012-09-14 NOTE — Telephone Encounter (Signed)
Patient called me back about an apt to see a Doctor over at [facility], she was very rude to me on the phone and told me why was I calling her and I told her when she was seeing Dr Derrell Lolling in the office that she and her mother wanted to be schedule over at [facility] and she went on saying to me make it quick and I gave her the phone number and I also told her that she is on a waiting list if the can get her in sooner they will call her. She is seeing Dr Lowell Guitar on 10-04-12 @ 9:30 a.m. and Dr Lowell Guitar office will mail out paper work to the patient and I also told her that,  I faxed everything to Dr Glean Salvo. I told her to have a nice day and she hung up on me.

## 2012-09-15 ENCOUNTER — Emergency Department (HOSPITAL_COMMUNITY): Payer: 59

## 2012-09-15 ENCOUNTER — Emergency Department (HOSPITAL_COMMUNITY)
Admission: EM | Admit: 2012-09-15 | Discharge: 2012-09-16 | Disposition: A | Discharge status: Home / Self Care | Payer: 59 | Attending: Emergency Medicine | Admitting: Emergency Medicine

## 2012-09-15 DIAGNOSIS — N83209 Unspecified ovarian cyst, unspecified side: Secondary | ICD-10-CM | POA: Insufficient documentation

## 2012-09-15 DIAGNOSIS — Z79899 Other long term (current) drug therapy: Secondary | ICD-10-CM | POA: Insufficient documentation

## 2012-09-15 DIAGNOSIS — R112 Nausea with vomiting, unspecified: Secondary | ICD-10-CM | POA: Insufficient documentation

## 2012-09-15 DIAGNOSIS — Z8659 Personal history of other mental and behavioral disorders: Secondary | ICD-10-CM | POA: Insufficient documentation

## 2012-09-15 DIAGNOSIS — Z8619 Personal history of other infectious and parasitic diseases: Secondary | ICD-10-CM | POA: Insufficient documentation

## 2012-09-15 DIAGNOSIS — N390 Urinary tract infection, site not specified: Secondary | ICD-10-CM | POA: Insufficient documentation

## 2012-09-15 DIAGNOSIS — Z8709 Personal history of other diseases of the respiratory system: Secondary | ICD-10-CM | POA: Insufficient documentation

## 2012-09-15 DIAGNOSIS — M119 Crystal arthropathy, unspecified: Secondary | ICD-10-CM | POA: Insufficient documentation

## 2012-09-15 DIAGNOSIS — F17200 Nicotine dependence, unspecified, uncomplicated: Secondary | ICD-10-CM | POA: Insufficient documentation

## 2012-09-15 DIAGNOSIS — K5900 Constipation, unspecified: Secondary | ICD-10-CM | POA: Insufficient documentation

## 2012-09-15 DIAGNOSIS — Z8719 Personal history of other diseases of the digestive system: Secondary | ICD-10-CM | POA: Insufficient documentation

## 2012-09-15 DIAGNOSIS — F411 Generalized anxiety disorder: Secondary | ICD-10-CM | POA: Insufficient documentation

## 2012-09-15 DIAGNOSIS — M549 Dorsalgia, unspecified: Secondary | ICD-10-CM | POA: Insufficient documentation

## 2012-09-15 DIAGNOSIS — R002 Palpitations: Secondary | ICD-10-CM | POA: Insufficient documentation

## 2012-09-15 DIAGNOSIS — Z8742 Personal history of other diseases of the female genital tract: Secondary | ICD-10-CM | POA: Insufficient documentation

## 2012-09-15 DIAGNOSIS — Z872 Personal history of diseases of the skin and subcutaneous tissue: Secondary | ICD-10-CM | POA: Insufficient documentation

## 2012-09-15 DIAGNOSIS — IMO0001 Reserved for inherently not codable concepts without codable children: Secondary | ICD-10-CM | POA: Insufficient documentation

## 2012-09-15 DIAGNOSIS — J309 Allergic rhinitis, unspecified: Secondary | ICD-10-CM | POA: Insufficient documentation

## 2012-09-15 DIAGNOSIS — G8929 Other chronic pain: Secondary | ICD-10-CM | POA: Insufficient documentation

## 2012-09-15 LAB — URINALYSIS, ROUTINE W REFLEX MICROSCOPIC
Bilirubin Urine: NEGATIVE
Glucose, UA: NEGATIVE mg/dL
Hgb urine dipstick: NEGATIVE
Ketones, ur: NEGATIVE mg/dL
Leukocytes, UA: NEGATIVE
Protein, ur: NEGATIVE mg/dL
pH: 6 (ref 5.0–8.0)

## 2012-09-15 LAB — CBC WITH DIFFERENTIAL/PLATELET
Basophils Absolute: 0 10*3/uL (ref 0.0–0.1)
Basophils Relative: 0 % (ref 0–1)
Eosinophils Absolute: 0.3 10*3/uL (ref 0.0–0.7)
Eosinophils Relative: 4 % (ref 0–5)
HCT: 42.1 % (ref 36.0–46.0)
Hemoglobin: 14.5 g/dL (ref 12.0–15.0)
Lymphocytes Relative: 41 % (ref 12–46)
Lymphs Abs: 2.9 10*3/uL (ref 0.7–4.0)
MCH: 31.9 pg (ref 26.0–34.0)
MCHC: 34.4 g/dL (ref 30.0–36.0)
MCV: 92.5 fL (ref 78.0–100.0)
Monocytes Absolute: 0.5 10*3/uL (ref 0.1–1.0)
Monocytes Relative: 6 % (ref 3–12)
Neutro Abs: 3.4 10*3/uL (ref 1.7–7.7)
Neutrophils Relative %: 48 % (ref 43–77)
Platelets: 200 10*3/uL (ref 150–400)
RBC: 4.55 MIL/uL (ref 3.87–5.11)
RDW: 12.1 % (ref 11.5–15.5)
WBC: 7.1 10*3/uL (ref 4.0–10.5)

## 2012-09-15 LAB — COMPREHENSIVE METABOLIC PANEL
ALT: 9 U/L (ref 0–35)
Albumin: 4.1 g/dL (ref 3.5–5.2)
BUN: 8 mg/dL (ref 6–23)
Calcium: 9 mg/dL (ref 8.4–10.5)
Creatinine, Ser: 0.48 mg/dL — ABNORMAL LOW (ref 0.50–1.10)
GFR calc Af Amer: >90 mL/min (ref >90)
GFR calc non Af Amer: >90 mL/min (ref >90)
Potassium: 3.6 mEq/L (ref 3.5–5.1)
Total Bilirubin: 0.4 mg/dL (ref 0.3–1.2)
Total Protein: 6.7 g/dL (ref 6.0–8.3)

## 2012-09-15 LAB — LIPASE, BLOOD: Lipase: 23 U/L (ref 11–59)

## 2012-09-15 MED ORDER — LORAZEPAM 2 MG/ML IJ SOLN
1.0000 mg | Freq: Once | INTRAMUSCULAR | Status: AC
  Administered 2012-09-15: 1 mg via INTRAVENOUS
  Filled 2012-09-15: qty 1

## 2012-09-15 MED ORDER — LORAZEPAM 2 MG/ML IJ SOLN
1.0000 mg | Freq: Once | INTRAMUSCULAR | Status: AC
  Administered 2012-09-15: 1 mg via INTRAVENOUS

## 2012-09-15 MED ORDER — HYDROMORPHONE HCL PF 2 MG/ML IJ SOLN
2.0000 mg | Freq: Once | INTRAMUSCULAR | Status: AC
  Administered 2012-09-15: 2 mg via INTRAVENOUS
  Filled 2012-09-15: qty 1

## 2012-09-15 MED ORDER — HYDROMORPHONE HCL PF 1 MG/ML IJ SOLN
1.0000 mg | Freq: Once | INTRAMUSCULAR | Status: AC
  Administered 2012-09-15: 1 mg via INTRAVENOUS
  Filled 2012-09-15: qty 1

## 2012-09-15 MED ORDER — SODIUM CHLORIDE 0.9 % IV BOLUS (SEPSIS)
1000.0000 mL | Freq: Once | INTRAVENOUS | Status: AC
  Administered 2012-09-15 (×2): 1000 mL via INTRAVENOUS

## 2012-09-15 MED ORDER — PROMETHAZINE HCL 25 MG/ML IJ SOLN
25.0000 mg | Freq: Once | INTRAMUSCULAR | Status: AC
  Administered 2012-09-15: 25 mg via INTRAVENOUS
  Filled 2012-09-15: qty 1

## 2012-09-15 NOTE — ED Provider Notes (Signed)
Patient has been moved to the CDU for holding.  Sign out received from Dr Manus Gunning.  Plan is for MRI lumbar spine.  If MRI shows no emergent process, pt may be d/c home after PO trial.  Pt has long history of abdominal pain and has been seen by multiple specialists including surgery yesterday, was seen at pain management today and was sent to ED.  She had an abdominal CT on 09/10/12 that was negative.  She is supposed to follow up at [facility], per family member for a second surgical opinion about her pain.  Abdominal pain is on the left side, is chronic and unchanged.  Labs and urine unremarkable, Acute abdomen xray is negative.  Pt seen prior to MRI - pt was under the influence of medication, was drowsy appearing but talkative, attempting to tell me the entire story of her chronic abdominal pain but intermittently forgetting what she was talking about.  Family member is bedside.  Pending MRI.    10:21 PM Pt has returned from MRI.  Per nurse, pt is sleeping soundly.  Awaiting result.    10:56 PM MRI shows degenerative changes/Schmorl's nodes at L1-L2.  Pt is sleeping soundly following pain medication and ativan for MRI.  Discussed results and follow up plan with significant other.  Pt currently slightly hypotensive after medication (systolic high 90s), receiving fluid bolus.    12:31 AM Pt is now awake and alert, requesting food and drink.  BP now 94/62.  Pt ambulating without difficulty.  Discussed with Dr Manus Gunning.  Plan is for discharge.  Pt requesting prescription for dilaudid at home, which I discussed with Dr Manus Gunning.  Pt will need to see PCP or pain management for dilaudid prescription.    Filed Vitals:   09/15/12 1739  BP: 107/76  Pulse: 79  Temp:   Resp: 16   11:05 PM Discussed patient with Eme, patient's nurse.  Patient to be d/c home once she is alert, has a steady blood pressure, and can tolerate PO.  I will prepare paperwork anticipating discharge.   Results for orders placed  during the hospital encounter of 09/15/12  CBC WITH DIFFERENTIAL      Component Value Range   WBC 7.1  4.0 - 10.5 K/uL   RBC 4.55  3.87 - 5.11 MIL/uL   Hemoglobin 14.5  12.0 - 15.0 g/dL   HCT 62.9  52.8 - 41.3 %   MCV 92.5  78.0 - 100.0 fL   MCH 31.9  26.0 - 34.0 pg   MCHC 34.4  30.0 - 36.0 g/dL   RDW 24.4  01.0 - 27.2 %   Platelets 200  150 - 400 K/uL   Neutrophils Relative 48  43 - 77 %   Neutro Abs 3.4  1.7 - 7.7 K/uL   Lymphocytes Relative 41  12 - 46 %   Lymphs Abs 2.9  0.7 - 4.0 K/uL   Monocytes Relative 6  3 - 12 %   Monocytes Absolute 0.5  0.1 - 1.0 K/uL   Eosinophils Relative 4  0 - 5 %   Eosinophils Absolute 0.3  0.0 - 0.7 K/uL   Basophils Relative 0  0 - 1 %   Basophils Absolute 0.0  0.0 - 0.1 K/uL  COMPREHENSIVE METABOLIC PANEL      Component Value Range   Sodium 139  135 - 145 mEq/L   Potassium 3.6  3.5 - 5.1 mEq/L   Chloride 105  96 - 112 mEq/L  CO2 26  19 - 32 mEq/L   Glucose, Bld 89  70 - 99 mg/dL   BUN 8  6 - 23 mg/dL   Creatinine, Ser 1.61 (*) 0.50 - 1.10 mg/dL   Calcium 9.0  8.4 - 09.6 mg/dL   Total Protein 6.7  6.0 - 8.3 g/dL   Albumin 4.1  3.5 - 5.2 g/dL   AST 15  0 - 37 U/L   ALT 9  0 - 35 U/L   Alkaline Phosphatase 71  39 - 117 U/L   Total Bilirubin 0.4  0.3 - 1.2 mg/dL   GFR calc non Af Amer >90  >90 mL/min   GFR calc Af Amer >90  >90 mL/min  LIPASE, BLOOD      Component Value Range   Lipase 23  11 - 59 U/L  LACTIC ACID, PLASMA      Component Value Range   Lactic Acid, Venous 0.6  0.5 - 2.2 mmol/L  URINALYSIS, ROUTINE W REFLEX MICROSCOPIC      Component Value Range   Color, Urine YELLOW  YELLOW   APPearance CLEAR  CLEAR   Specific Gravity, Urine 1.011  1.005 - 1.030   pH 6.0  5.0 - 8.0   Glucose, UA NEGATIVE  NEGATIVE mg/dL   Hgb urine dipstick NEGATIVE  NEGATIVE   Bilirubin Urine NEGATIVE  NEGATIVE   Ketones, ur NEGATIVE  NEGATIVE mg/dL   Protein, ur NEGATIVE  NEGATIVE mg/dL   Urobilinogen, UA 0.2  0.0 - 1.0 mg/dL   Nitrite NEGATIVE   NEGATIVE   Leukocytes, UA NEGATIVE  NEGATIVE  PREGNANCY, URINE      Component Value Range   Preg Test, Ur NEGATIVE  NEGATIVE   Mr Lumbar Spine Wo Contrast  09/15/2012  *RADIOLOGY REPORT*  Clinical Data: Back pain with left lower extremity weakness.  MRI LUMBAR SPINE WITHOUT CONTRAST  Technique:  Multiplanar and multiecho pulse sequences of the lumbar spine were obtained without intravenous contrast.  Comparison: No prior MRI scans.  Recent CT abdomen pelvis 09/10/2012.  Also 03/03/2010 CT.  Findings: Anatomic alignment.  Intervertebral disc spaces are preserved except for L1-L2 where Schmorl's nodes projecting into the vertebral bodies above below have decreased disc height and signal.  The conus is normal.  There is no worrisome osseous destructive lesion.  Paraspinous and retroperitoneal structures unremarkable.  Normal conus.  Axial images through the individual spaces demonstrate no nerve root cut off or spinal stenosis.  IMPRESSION: Chronic changes as described L1-L2 with Schmorl's nodes.  No neural compression.   Original Report Authenticated By: Davonna Belling, M.D.    Ct Abdomen Pelvis W Contrast  09/10/2012  *RADIOLOGY REPORT*  Clinical Data: Abdominal pain.  CT ABDOMEN AND PELVIS WITH CONTRAST  Technique:  Multidetector CT imaging of the abdomen and pelvis was performed following the standard protocol during bolus administration of intravenous contrast.  Contrast: OMNIPAQUE IOHEXOL 300 MG/ML  SOLN  Comparison: 06/01/2012  Findings: Lung bases are clear.  No effusions.  Heart is normal size.  Stomach, liver, gallbladder, spleen, pancreas, adrenals and kidneys are normal.  Appendix not definitively seen.  No inflammatory process noted in the right lower quadrant.  Moderate stool throughout the colon. Small bowel is decompressed.  Prior hysterectomy.  No adnexal masses.  No free fluid.  No acute bony abnormality.  Urinary bladder is unremarkable.  IMPRESSION: No acute findings in the abdomen or  pelvis.   Original Report Authenticated By: Charlett Nose, M.D.    Dg Abd  Acute W/chest  09/15/2012  *RADIOLOGY REPORT*  Clinical Data: Abdominal pain.  ACUTE ABDOMEN SERIES (ABDOMEN 2 VIEW & CHEST 1 VIEW)  Comparison: 08/23/2012.  Findings: Lung volumes are normal.  No consolidative airspace disease.  No pleural effusions.  No pneumothorax.  No pulmonary nodule or mass noted.  Pulmonary vasculature and the cardiomediastinal silhouette are within normal limits.  Gas, stool and oral contrast material is noted throughout the colon extending to the level of the distal rectum.  No pathologic distension of small bowel is identified.  No gross evidence of pneumoperitoneum.  Markers from a matched or abdominal wall hernia repair are seen projecting over the lower abdomen and central pelvis.  IMPRESSION: 1.  No radiographic evidence of acute cardiopulmonary disease. 2.  Nonobstructive bowel gas pattern. 3.  No pneumoperitoneum.   Original Report Authenticated By: Trudie Reed, M.D.    Dg Abd Acute W/chest  08/23/2012  *RADIOLOGY REPORT*  Clinical Data: Abdominal pain  ACUTE ABDOMEN SERIES (ABDOMEN 2 VIEW & CHEST 1 VIEW)  Comparison: 07/13/2012  Findings: Lungs are clear.  Cardiomediastinal contours within normal limits.  No free intraperitoneal air.  Nonobstructive bowel gas pattern. Mesh coils project over the midline pelvis.  Organ outlines normal where seen.  No acute osseous finding.  Gentle rightward curvature of the lumbar spine.  IMPRESSION: Nonobstructive bowel gas pattern.   Original Report Authenticated By: Waneta Martins, M.D.       [place], [place] 09/16/12 0040

## 2012-09-15 NOTE — ED Notes (Signed)
Pt continues to refuse to go to MRI until she gets more pain medication.

## 2012-09-15 NOTE — ED Notes (Signed)
Pt brought back from xray. Pt states she will not be able to lay still due to being in so much pain for the MRI. MD made aware; orders to follow.

## 2012-09-15 NOTE — ED Provider Notes (Signed)
History  This chart was scribed for Gina Octave, MD by [name], ED Scribe. This patient was seen in room A06C/A06C and the patient's care was started at 4:56 PM.   CSN: [id number]  Arrival date & time 09/15/12  1329   First MD Initiated Contact with Patient 09/15/12 1656      No chief complaint on file.    The history is provided by the patient. No language interpreter was used.    Gina [name] is a 36 y.o. female with a h/o chronic abdominal pain brought in by ambulance, who presents to the Emergency Department complaining of 6 weeks of gradual onset, gradually worsening, constant periumbilical abdominal pain described as a constant cramp that radiates to the back and down the left leg that worsened 6 days ago with associated abdominal distention, nausea and intermittent emesis that started after she had abdominal mesh placed. She states that the pain only radiates when it's at its most intense. She reports that she was told that the abdominal mesh failed and she states that she has tried to follow up with her surgeon but was unable to get through to the office. She reports that she had a nerve conduction test at the pain clinic today but was sent here, because they couldn't "handle my pain". She also reports that she was given a referral to follow up with [facility] yesterday but has yet to do so. She denies fevers, urinary symptoms, vaginal pain or bleeding, diarrhea and constipation as associated symptoms. She reports a partial hysterectomy but denies an appendectomy and cholecystectomy. She has a h/o chronic back pain but denies prior back surgeries, GERD and fibromyalgia. She is a current everyday smoker but denies alcohol use.  Pt has been seen in several [facility] EDs for the same, her last visit being on 09/10/12 (5 days ago).  Dr. [name] is PCP.  Past Medical History  Diagnosis Date  . ABDOMINAL WALL HERNIA 02/27/2010  . ALLERGIC RHINITIS 06/02/2007  . ANOREXIA,  CHRONIC 08/07/2008  . ANXIETY 09/15/2010  . BACK PAIN, THORACIC REGION 07/07/2007  . BACK PAIN, THORACIC REGION 07/07/2007  . CERVICAL RADICULOPATHY 12/11/2008  . Condyloma acuminatum 04/23/2009  . CONSTIPATION 09/15/2010  . DYSPHAGIA UNSPECIFIED 09/09/2009  . 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 ENDOMETRIOSIS 12/16/2009  . FATIGUE 06/11/2010  . GERD 10/23/2009  . HERPES SIMPLEX INFECTION 06/11/2010  . LENTIGO 04/23/2009  . LUNG NODULE 06/17/2010  . Palpitations 10/23/2009  . 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 Panic disorder without agoraphobia 08/07/2008  . Stricture and stenosis of esophagus 05/13/2010  . TOBACCO ABUSE 08/07/2008  . Heart palpitations   . Urinary tract infection   . Fibromyalgia   . Rheumatoid arthritis   . Ovarian cyst   . Abnormal Pap smear   . Fibromyalgia     Past Surgical History  Procedure Date  . Partial hysterectomy   . Endometrial ablation   . Hernia repair     X3  . Dilation and curettage of uterus     x1   . Cesarean section     x2   . Esophageal dilatation x 4   . Gum surgery   . Abdominal surgery     Family History  Problem Relation Age of Onset  . Depression Mother   . Arthritis Mother   . Hyperlipidemia Father   . Hypertension Father     History  Substance Use Topics  . Smoking status: Current Every Day Smoker -- 1.0  packs/day    Types: Cigarettes  . Smokeless tobacco: Never Used  . Alcohol Use: No    OB History    Grav Para Term Preterm Abortions TAB SAB Ect Mult Living   3 2 2  0 1 0 1 0 0 2      Review of Systems  A complete 10 system review of systems was obtained and all systems are negative except as noted in the HPI and PMH.    Allergies  Lamictal; Ondansetron hcl; Zofran; Chantix; Nicotine; Skelaxin; and Penicillins  Home Medications   Current Outpatient Rx  Name  Route  Sig  Dispense  Refill  . ACETAMINOPHEN 325 MG PO TABS   Oral   Take 325 mg by mouth every 6 (six) hours as needed. For pain         .  CLONAZEPAM 2 MG PO TABS   Oral   Take 0.5 tablets (1 mg total) by mouth 4 (four) times daily.   60 tablet   3   . ESCITALOPRAM OXALATE 20 MG PO TABS   Oral   Take 1 tablet (20 mg total) by mouth daily.   90 tablet   3   . HYDROMORPHONE HCL 2 MG PO TABS   Oral   Take 2 mg by mouth every 4 (four) hours as needed. For pain           Triage Vitals: BP 101/68  Pulse 88  Temp 97.6 F (36.4 C) (Oral)  Resp 18  SpO2 100%  Physical Exam  Nursing note and vitals reviewed. Constitutional: She is oriented to person, place, and time. She appears well-developed and well-nourished. No distress.  HENT:  Head: Normocephalic and atraumatic.  Eyes: Conjunctivae normal and EOM are normal.  Neck: Neck supple. No tracheal deviation present.  Cardiovascular: Normal rate, regular rhythm and normal heart sounds.   Pulmonary/Chest: Effort normal and breath sounds normal. No respiratory distress.  Abdominal: Soft. There is tenderness (diffuse tenderness, worse on the left). There is no rebound and no guarding.       Pt refuses to allow me to exam her mid adbomen  Musculoskeletal: Normal range of motion. She exhibits tenderness. She exhibits no edema.       Tenderness to the lower back diffusely, 5/5 strength in bilateral lower extremities. Ankle plantar and dorsiflexion intact. Great toe extension intact bilaterally. +2 DP and PT pulses. +2 patellar reflexes bilaterally. Normal gait.  Neurological: She is alert and oriented to person, place, and time.  Skin: Skin is warm and dry.  Psychiatric: She has a normal mood and affect. Her behavior is normal.    ED Course  Procedures (including critical care time)  DIAGNOSTIC STUDIES: Oxygen Saturation is 100% on room air, normal by my interpretation.    COORDINATION OF CARE:  5:15 PM- Ordered 1,000 mL of Bolus  5:23 PM-Discussed treatment plan which includes antiemetic (pheregan), anxiety medications (Ativan) and pain medication (Dilaudid) with  pt at bedside and pt agreed to plan.  5:30 PM- Ordered 2 mg injection of Dilaudid, 1 mg injection of Ativan, and 25 mg injection of Phenergan    Labs Reviewed  COMPREHENSIVE METABOLIC PANEL - Abnormal; Notable for the following:    Creatinine, Ser 0.48 (*)     All other components within normal limits  CBC WITH DIFFERENTIAL  LIPASE, BLOOD  LACTIC ACID, PLASMA  URINALYSIS, ROUTINE W REFLEX MICROSCOPIC  PREGNANCY, URINE   Dg Abd Acute W/chest  09/15/2012  *RADIOLOGY REPORT*  Clinical  Data: Abdominal pain.  ACUTE ABDOMEN SERIES (ABDOMEN 2 VIEW & CHEST 1 VIEW)  Comparison: 08/23/2012.  Findings: Lung volumes are normal.  No consolidative airspace disease.  No pleural effusions.  No pneumothorax.  No pulmonary nodule or mass noted.  Pulmonary vasculature and the cardiomediastinal silhouette are within normal limits.  Gas, stool and oral contrast material is noted throughout the colon extending to the level of the distal rectum.  No pathologic distension of small bowel is identified.  No gross evidence of pneumoperitoneum.  Markers from a matched or abdominal wall hernia repair are seen projecting over the lower abdomen and central pelvis.  IMPRESSION: 1.  No radiographic evidence of acute cardiopulmonary disease. 2.  Nonobstructive bowel gas pattern. 3.  No pneumoperitoneum.   Original Report Authenticated By: Trudie Reed, M.D.      No diagnosis found.    MDM  Patient presents with 6 weeks of abdominal pain that radiates to her left side down her left leg. She said the pain clinic today because her pain was "out of control". No fevers, chills, nausea, vomiting, vaginal bleeding or discharge. She said extensive workup unknown etiology of her pain. She was seen by surgery yesterday and referred to [facility]. She had negative CT scan November 10.  Previous records reviewed. Patient's behavior in argumentative behavior noted.  She will not let me examine her abdomen properly  today.  Abdominal series is negative for any acute pathology. Her CT scan was negative and on November 10. She has had followup with surgery and GI and needs management of her chronic pain. Will perform labs and AAS to r/o acute pathology today.   I personally performed the services described in this documentation, which was scribed in my presence. The recorded information has been reviewed and is accurate.    Gina Octave, MD 09/15/12 2155

## 2012-09-15 NOTE — ED Notes (Signed)
Pt bp remains low (80-90's systolic), however pt denies dizziness, lightheadedness. Pt easily aroused. Pt currently sitting up in bed eating and drinking coffee. Husband at bedside.

## 2012-09-15 NOTE — ED Notes (Addendum)
Abd. Pain - umbilical, radiating from lt abd. To back. Coming from pain clinic. Sx. Started after dr. Darrel Reach belly. Chronic pain. This pain getting worse. Nerve conduction test at pain clinic. Weak lt. Side.

## 2012-09-16 NOTE — ED Provider Notes (Signed)
Medical screening examination/treatment/procedure(s) were conducted as a shared visit with non-physician practitioner(s) and myself.  I personally evaluated the patient during the encounter  [name], MD 09/16/12 1146

## 2012-09-16 NOTE — ED Notes (Signed)
Pt ambulatory in hallway; now being wheeled around in hallway by husband, pt states she just wants to wheel around for a bit to make sure she is okay before d/c. Pt stating that she is feeling much better now.

## 2012-09-18 ENCOUNTER — Telehealth: Payer: Self-pay | Admitting: Family Medicine

## 2012-09-18 MED ORDER — HYDROMORPHONE HCL 2 MG PO TABS: 2.0000 mg | ORAL_TABLET | Freq: Four times a day (QID) | ORAL | Status: DC | PRN

## 2012-09-18 NOTE — Telephone Encounter (Signed)
Left message on machine Ready for pick up 

## 2012-09-18 NOTE — Telephone Encounter (Signed)
Per dr Lovell Sheehan- due to the holidays,may have 3 weeks worth.printed

## 2012-09-18 NOTE — Telephone Encounter (Signed)
Patient calling. States Dr. Lovell Sheehan wanted her to see a surgeon - she did that, and he said he cuold not hep her & referred to [facility] (appt w/them 12/4). Also went to the pain clinic as Dr. Lovell Sheehan advised. Saw them last Friday for new pt appt. She did all their prelim testing and saw doc at 12:45. By then, she states her abd was swelled and she was in a lot of pain. Doctor at pain clinic said she "was too sick" to be seen and had to be stabilized first. Against her will (her words), the pain clinic doc called an ambulance for her to pick her up at the pain clinic and take her to the ED. She was there until 1:30am.  Now she is out of meds completely, and cannot be seen by pain clinic until tomorrow. However, she is not sure she can get off work to go tomorrow, since she was out of work Fri to go to the pain clinic appt and then ED. She is requesting Dr. Lovell Sheehan give her one last refill on HYDROmorphone (DILAUDID) 2 MG tablet - a few days worth. She has to now pay another $50 to go back to pain clinic, because they would not see her Friday. Please advise and call her. Thanks.

## 2012-09-20 ENCOUNTER — Telehealth: Payer: Self-pay | Admitting: Internal Medicine

## 2012-09-20 NOTE — Telephone Encounter (Signed)
Pt states pain clinic doctor would not prescribe her any meds. She states will be out of meds soon and does not know where to go from here. She does not need to go to ER, but needs a plan of action soon.  No help from pain doctor.  Her phone number [phone].

## 2012-09-20 NOTE — Telephone Encounter (Signed)
Talked with pt and she states she has done everything she was told to do ,but still no help with pain- surgeon made her an appointment with [facility] on dec 4.very distraught and tearful pt

## 2012-09-20 NOTE — Telephone Encounter (Signed)
Error/kjh 

## 2012-09-20 NOTE — Telephone Encounter (Signed)
Per dr Lovell Sheehan- go to the er as her pain md requested--Left message on machine For pt

## 2012-09-20 NOTE — Telephone Encounter (Signed)
Pt called req an emergency work in appt with Dr Lovell Sheehan re: extreme pain. Pt said that her pain doctor refuses to write any more scripts until they can determine what is causing pts pain,and told pt to go live in the ER. Refused to see another dr. Rock Nephew req call back from [facility].

## 2012-09-22 ENCOUNTER — Telehealth: Payer: Self-pay | Admitting: Internal Medicine

## 2012-09-22 NOTE — Telephone Encounter (Signed)
Pt has appt with general surgery dec 4 in [place]. Pt has cut down dilaudid to half every 4 to 5 hours. Pt has been to pain clinic twice. Pt unable to work right now. Pt stated since she had surgery for endometriosis she is worst. Pt will be out of med dilaudid soon. Please advise

## 2012-09-22 NOTE — Telephone Encounter (Signed)
Talked with pt yesterday at length and dr Lovell Sheehan is familiar with what is going on.

## 2012-09-26 ENCOUNTER — Encounter (HOSPITAL_COMMUNITY): Payer: Self-pay | Admitting: Emergency Medicine

## 2012-09-26 ENCOUNTER — Emergency Department (HOSPITAL_COMMUNITY)
Admission: EM | Admit: 2012-09-26 | Discharge: 2012-09-27 | Disposition: A | Discharge status: Home / Self Care | Payer: 59 | Attending: Emergency Medicine | Admitting: Emergency Medicine

## 2012-09-26 ENCOUNTER — Telehealth: Payer: Self-pay | Admitting: Internal Medicine

## 2012-09-26 DIAGNOSIS — Z87440 Personal history of urinary (tract) infections: Secondary | ICD-10-CM | POA: Insufficient documentation

## 2012-09-26 DIAGNOSIS — F410 Panic disorder [episodic paroxysmal anxiety] without agoraphobia: Secondary | ICD-10-CM | POA: Insufficient documentation

## 2012-09-26 DIAGNOSIS — M069 Rheumatoid arthritis, unspecified: Secondary | ICD-10-CM | POA: Insufficient documentation

## 2012-09-26 DIAGNOSIS — Z79899 Other long term (current) drug therapy: Secondary | ICD-10-CM | POA: Insufficient documentation

## 2012-09-26 DIAGNOSIS — K219 Gastro-esophageal reflux disease without esophagitis: Secondary | ICD-10-CM | POA: Insufficient documentation

## 2012-09-26 DIAGNOSIS — R109 Unspecified abdominal pain: Secondary | ICD-10-CM | POA: Insufficient documentation

## 2012-09-26 DIAGNOSIS — IMO0001 Reserved for inherently not codable concepts without codable children: Secondary | ICD-10-CM | POA: Insufficient documentation

## 2012-09-26 DIAGNOSIS — J309 Allergic rhinitis, unspecified: Secondary | ICD-10-CM | POA: Insufficient documentation

## 2012-09-26 DIAGNOSIS — Z8742 Personal history of other diseases of the female genital tract: Secondary | ICD-10-CM | POA: Insufficient documentation

## 2012-09-26 DIAGNOSIS — G8929 Other chronic pain: Secondary | ICD-10-CM | POA: Insufficient documentation

## 2012-09-26 DIAGNOSIS — Z8719 Personal history of other diseases of the digestive system: Secondary | ICD-10-CM | POA: Insufficient documentation

## 2012-09-26 DIAGNOSIS — Z8619 Personal history of other infectious and parasitic diseases: Secondary | ICD-10-CM | POA: Insufficient documentation

## 2012-09-26 DIAGNOSIS — R110 Nausea: Secondary | ICD-10-CM | POA: Insufficient documentation

## 2012-09-26 DIAGNOSIS — F17200 Nicotine dependence, unspecified, uncomplicated: Secondary | ICD-10-CM | POA: Insufficient documentation

## 2012-09-26 DIAGNOSIS — F411 Generalized anxiety disorder: Secondary | ICD-10-CM | POA: Insufficient documentation

## 2012-09-26 DIAGNOSIS — R002 Palpitations: Secondary | ICD-10-CM | POA: Insufficient documentation

## 2012-09-26 DIAGNOSIS — M549 Dorsalgia, unspecified: Secondary | ICD-10-CM | POA: Insufficient documentation

## 2012-09-26 LAB — CBC WITH DIFFERENTIAL/PLATELET
Basophils Absolute: 0 10*3/uL (ref 0.0–0.1)
Eosinophils Relative: 4 % (ref 0–5)
HCT: 38.4 % (ref 36.0–46.0)
Lymphocytes Relative: 44 % (ref 12–46)
MCHC: 35.7 g/dL (ref 30.0–36.0)
MCV: 90.1 fL (ref 78.0–100.0)
Monocytes Absolute: 0.7 10*3/uL (ref 0.1–1.0)
RDW: 11.8 % (ref 11.5–15.5)
WBC: 7.6 10*3/uL (ref 4.0–10.5)

## 2012-09-26 MED ORDER — SODIUM CHLORIDE 0.9 % IV SOLN
INTRAVENOUS | Status: DC
  Administered 2012-09-27 (×2): via INTRAVENOUS

## 2012-09-26 MED ORDER — LORAZEPAM 2 MG/ML IJ SOLN
1.0000 mg | Freq: Once | INTRAMUSCULAR | Status: AC
  Administered 2012-09-27: 1 mg via INTRAVENOUS
  Filled 2012-09-26: qty 1

## 2012-09-26 MED ORDER — HYDROMORPHONE HCL PF 1 MG/ML IJ SOLN
1.0000 mg | Freq: Once | INTRAMUSCULAR | Status: AC
  Administered 2012-09-27: 1 mg via INTRAVENOUS
  Filled 2012-09-26: qty 1

## 2012-09-26 MED ORDER — PROMETHAZINE HCL 25 MG/ML IJ SOLN
25.0000 mg | Freq: Once | INTRAMUSCULAR | Status: AC
  Administered 2012-09-27: 25 mg via INTRAVENOUS
  Filled 2012-09-26: qty 1

## 2012-09-26 NOTE — ED Notes (Signed)
PT. REPORTS CHRONIC MID ABDOMINAL AND LEFT LOWER BACK PAIN  WORSE PAST SEVERAL DAYS , DENIES INJURY , STATES HISTORY OF DEGENERATIVE DISC DISEASE AND FIBROMYALGIA. OCCASIONAL NAUSEA AND VOMITTING .

## 2012-09-26 NOTE — Telephone Encounter (Signed)
Patient called stating that she would like a call back from the MD as she is having mulitiple issues and is at risk for losing her job. Patient refused to talk with the nurse. Please assist.

## 2012-09-26 NOTE — ED Notes (Signed)
Pt st's she has appt with surgeon 12-4 for abd pain.  St's she doesn't know if the pain is coming from her back or her abd.

## 2012-09-26 NOTE — ED Provider Notes (Addendum)
History     CSN: [id number]  Arrival date & time 09/26/12  2146   First MD Initiated Contact with Patient 09/26/12 2304      Chief Complaint  Patient presents with  . Abdominal Pain  . Back Pain    (Consider location/radiation/quality/duration/timing/severity/associated sxs/prior treatment) Patient is a 36 y.o. female presenting with abdominal pain and back pain. The history is provided by the patient and medical records.  Abdominal Pain The primary symptoms of the illness include abdominal pain and nausea. The primary symptoms of the illness do not include fever, shortness of breath, vomiting, diarrhea or dysuria.  Additional symptoms associated with the illness include back pain. Symptoms associated with the illness do not include chills or diaphoresis.  Back Pain  Associated symptoms include abdominal pain. Pertinent negatives include no chest pain, no fever, no headaches and no dysuria.   36 year old, female, with a history of endometriosis, fibromyalgias, multiple abdominal surgeries, presents emergency department complaining of chronic abdominal pain.  She's had nausea, with no vomiting.  She denies diarrhea, or urinary tract symptoms.  She has not had a fever.  She has had a partial hysterectomy.  Past Medical History  Diagnosis Date  . ABDOMINAL WALL HERNIA 02/27/2010  . ALLERGIC RHINITIS 06/02/2007  . ANOREXIA, CHRONIC 08/07/2008  . ANXIETY 09/15/2010  . BACK PAIN, THORACIC REGION 07/07/2007  . BACK PAIN, THORACIC REGION 07/07/2007  . CERVICAL RADICULOPATHY 12/11/2008  . Condyloma acuminatum 04/23/2009  . CONSTIPATION 09/15/2010  . DYSPHAGIA UNSPECIFIED 09/09/2009  . 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 ENDOMETRIOSIS 12/16/2009  . FATIGUE 06/11/2010  . GERD 10/23/2009  . HERPES SIMPLEX INFECTION 06/11/2010  . LENTIGO 04/23/2009  . LUNG NODULE 06/17/2010  . Palpitations 10/23/2009  . 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 Panic disorder without agoraphobia 08/07/2008  .  Stricture and stenosis of esophagus 05/13/2010  . TOBACCO ABUSE 08/07/2008  . Heart palpitations   . Urinary tract infection   . Fibromyalgia   . Rheumatoid arthritis   . Ovarian cyst   . Abnormal Pap smear   . Fibromyalgia     Past Surgical History  Procedure Date  . Partial hysterectomy   . Endometrial ablation   . Hernia repair     X3  . Dilation and curettage of uterus     x1   . Cesarean section     x2   . Esophageal dilatation x 4   . Gum surgery   . Abdominal surgery     Family History  Problem Relation Age of Onset  . Depression Mother   . Arthritis Mother   . Hyperlipidemia Father   . Hypertension Father     History  Substance Use Topics  . Smoking status: Current Every Day Smoker -- 1.0 packs/day    Types: Cigarettes  . Smokeless tobacco: Never Used  . Alcohol Use: No    OB History    Grav Para Term Preterm Abortions TAB SAB Ect Mult Living   3 2 2  0 1 0 1 0 0 2      Review of Systems  Constitutional: Negative for fever, chills and diaphoresis.  Respiratory: Negative for shortness of breath.   Cardiovascular: Negative for chest pain.  Gastrointestinal: Positive for nausea and abdominal pain. Negative for vomiting and diarrhea.  Genitourinary: Negative for dysuria.  Musculoskeletal: Positive for back pain.  Neurological: Negative for headaches.  Psychiatric/Behavioral: Negative for confusion.  All other systems reviewed and are negative.    Allergies  Lamictal; Zofran;  Chantix; Nicotine; Skelaxin; and Penicillins  Home Medications   Current Outpatient Rx  Name  Route  Sig  Dispense  Refill  . ACETAMINOPHEN 325 MG PO TABS   Oral   Take 325 mg by mouth every 6 (six) hours as needed. For pain         . CLONAZEPAM 2 MG PO TABS   Oral   Take 1 mg by mouth 4 (four) times daily as needed. For anxiety         . CYCLOBENZAPRINE HCL 10 MG PO TABS   Oral   Take 10 mg by mouth 3 (three) times daily as needed. For muscle spasms         .  ESCITALOPRAM OXALATE 20 MG PO TABS   Oral   Take 1 tablet (20 mg total) by mouth daily.   90 tablet   3   . HYDROMORPHONE HCL 2 MG PO TABS   Oral   Take 2 mg by mouth every 4 (four) hours as needed. For pain         . TRAMADOL HCL 50 MG PO TABS   Oral   Take 50 mg by mouth every 6 (six) hours as needed. For pain           BP 107/74  Pulse 97  Temp 98.8 F (37.1 C) (Oral)  Resp 18  SpO2 97%  Physical Exam  Nursing note and vitals reviewed. Constitutional: She is oriented to person, place, and time. She appears well-developed and well-nourished. No distress.       Crying  HENT:  Head: Normocephalic and atraumatic.  Eyes: Conjunctivae normal are normal.  Neck: Normal range of motion. Neck supple.  Cardiovascular: Normal rate and intact distal pulses.   No murmur heard. Pulmonary/Chest: Effort normal and breath sounds normal. No respiratory distress.  Abdominal: Soft. Bowel sounds are normal. She exhibits no distension. There is tenderness. There is no guarding.       Bilateral lower abdominal mild tenderness without peritoneal signs  Musculoskeletal: Normal range of motion.  Neurological: She is alert and oriented to person, place, and time.  Skin: Skin is warm and dry.  Psychiatric: She has a normal mood and affect. Thought content normal.    ED Course  Procedures (including critical care time)   Labs Reviewed  COMPREHENSIVE METABOLIC PANEL  CBC WITH DIFFERENTIAL  URINALYSIS, ROUTINE W REFLEX MICROSCOPIC   No results found.   No diagnosis found.  2:52 AM Pain control.  No acute abdomen.  MDM  Chronic abdominal pain.  No new symptoms.  We will establish an IV and give her analgesics, antiemetics, and anxiolytics.  We will do blood tests, and urinalysis.  There is no indication for advanced imaging        Cheri Guppy, MD 09/26/12 2348  Cheri Guppy, MD 09/27/12 [phone]

## 2012-09-26 NOTE — Telephone Encounter (Signed)
Left message on machine Dr Lovell Sheehan out of office until middle of next week- will need to make ov with a partner md

## 2012-09-27 LAB — COMPREHENSIVE METABOLIC PANEL
Albumin: 3.8 g/dL (ref 3.5–5.2)
Alkaline Phosphatase: 69 U/L (ref 39–117)
BUN: 8 mg/dL (ref 6–23)
CO2: 24 mEq/L (ref 19–32)
Chloride: 105 mEq/L (ref 96–112)
GFR calc non Af Amer: >90 mL/min (ref >90)
Glucose, Bld: 89 mg/dL (ref 70–99)
Potassium: 3.8 mEq/L (ref 3.5–5.1)
Total Bilirubin: 0.3 mg/dL (ref 0.3–1.2)

## 2012-09-27 MED ORDER — HYDROMORPHONE HCL 4 MG PO TABS: 4.0000 mg | ORAL_TABLET | ORAL | Status: DC | PRN

## 2012-09-27 MED ORDER — PROMETHAZINE HCL 25 MG PO TABS: 25.0000 mg | ORAL_TABLET | Freq: Four times a day (QID) | ORAL | Status: DC | PRN

## 2012-09-27 MED ORDER — HYDROMORPHONE HCL PF 2 MG/ML IJ SOLN
3.0000 mg | Freq: Once | INTRAMUSCULAR | Status: AC
  Administered 2012-09-27: 3 mg via INTRAVENOUS
  Filled 2012-09-27: qty 2

## 2012-09-27 NOTE — ED Notes (Signed)
Pt reports her pcp and the pain clinic will no longer prescribe her pain medication and she is almost out of her prescription

## 2012-10-20 ENCOUNTER — Encounter (HOSPITAL_BASED_OUTPATIENT_CLINIC_OR_DEPARTMENT_OTHER): Payer: Self-pay | Admitting: Emergency Medicine

## 2012-10-20 ENCOUNTER — Emergency Department (HOSPITAL_BASED_OUTPATIENT_CLINIC_OR_DEPARTMENT_OTHER)
Admission: EM | Admit: 2012-10-20 | Discharge: 2012-10-20 | Disposition: A | Discharge status: Home / Self Care | Payer: 59 | Attending: Emergency Medicine | Admitting: Emergency Medicine

## 2012-10-20 ENCOUNTER — Emergency Department (HOSPITAL_BASED_OUTPATIENT_CLINIC_OR_DEPARTMENT_OTHER): Payer: 59

## 2012-10-20 DIAGNOSIS — Z8719 Personal history of other diseases of the digestive system: Secondary | ICD-10-CM | POA: Insufficient documentation

## 2012-10-20 DIAGNOSIS — Z8679 Personal history of other diseases of the circulatory system: Secondary | ICD-10-CM | POA: Insufficient documentation

## 2012-10-20 DIAGNOSIS — Z8619 Personal history of other infectious and parasitic diseases: Secondary | ICD-10-CM | POA: Insufficient documentation

## 2012-10-20 DIAGNOSIS — Z90711 Acquired absence of uterus with remaining cervical stump: Secondary | ICD-10-CM | POA: Insufficient documentation

## 2012-10-20 DIAGNOSIS — R1084 Generalized abdominal pain: Secondary | ICD-10-CM | POA: Insufficient documentation

## 2012-10-20 DIAGNOSIS — Z3202 Encounter for pregnancy test, result negative: Secondary | ICD-10-CM | POA: Insufficient documentation

## 2012-10-20 DIAGNOSIS — G8918 Other acute postprocedural pain: Secondary | ICD-10-CM | POA: Insufficient documentation

## 2012-10-20 DIAGNOSIS — F341 Dysthymic disorder: Secondary | ICD-10-CM | POA: Insufficient documentation

## 2012-10-20 DIAGNOSIS — Z9889 Other specified postprocedural states: Secondary | ICD-10-CM | POA: Insufficient documentation

## 2012-10-20 DIAGNOSIS — Z8709 Personal history of other diseases of the respiratory system: Secondary | ICD-10-CM | POA: Insufficient documentation

## 2012-10-20 DIAGNOSIS — Z79899 Other long term (current) drug therapy: Secondary | ICD-10-CM | POA: Insufficient documentation

## 2012-10-20 DIAGNOSIS — F17200 Nicotine dependence, unspecified, uncomplicated: Secondary | ICD-10-CM | POA: Insufficient documentation

## 2012-10-20 DIAGNOSIS — Z8739 Personal history of other diseases of the musculoskeletal system and connective tissue: Secondary | ICD-10-CM | POA: Insufficient documentation

## 2012-10-20 DIAGNOSIS — Z87440 Personal history of urinary (tract) infections: Secondary | ICD-10-CM | POA: Insufficient documentation

## 2012-10-20 DIAGNOSIS — Z8742 Personal history of other diseases of the female genital tract: Secondary | ICD-10-CM | POA: Insufficient documentation

## 2012-10-20 LAB — URINALYSIS, ROUTINE W REFLEX MICROSCOPIC
Glucose, UA: NEGATIVE mg/dL
Hgb urine dipstick: NEGATIVE
Leukocytes, UA: NEGATIVE
Specific Gravity, Urine: 1.016 (ref 1.005–1.030)
pH: 7 (ref 5.0–8.0)

## 2012-10-20 LAB — CBC WITH DIFFERENTIAL/PLATELET
Basophils Absolute: 0 10*3/uL (ref 0.0–0.1)
Basophils Relative: 0 % (ref 0–1)
Eosinophils Absolute: 1 10*3/uL — ABNORMAL HIGH (ref 0.0–0.7)
Eosinophils Relative: 11 % — ABNORMAL HIGH (ref 0–5)
HCT: 38.6 % (ref 36.0–46.0)
MCH: 32.1 pg (ref 26.0–34.0)
MCHC: 35 g/dL (ref 30.0–36.0)
Monocytes Absolute: 0.8 10*3/uL (ref 0.1–1.0)
Neutro Abs: 4 10*3/uL (ref 1.7–7.7)
RDW: 11.8 % (ref 11.5–15.5)

## 2012-10-20 LAB — PREGNANCY, URINE: Preg Test, Ur: NEGATIVE

## 2012-10-20 LAB — COMPREHENSIVE METABOLIC PANEL
AST: 26 U/L (ref 0–37)
Albumin: 3.5 g/dL (ref 3.5–5.2)
Calcium: 9.1 mg/dL (ref 8.4–10.5)
Chloride: 102 mEq/L (ref 96–112)
Creatinine, Ser: 0.5 mg/dL (ref 0.50–1.10)
Total Protein: 6.5 g/dL (ref 6.0–8.3)

## 2012-10-20 MED ORDER — IOHEXOL 300 MG/ML  SOLN
100.0000 mL | Freq: Once | INTRAMUSCULAR | Status: AC | PRN
  Administered 2012-10-20: 100 mL via INTRAVENOUS

## 2012-10-20 MED ORDER — FENTANYL CITRATE 0.05 MG/ML IJ SOLN
100.0000 ug | Freq: Once | INTRAMUSCULAR | Status: AC
  Administered 2012-10-20: 100 ug via INTRAVENOUS
  Filled 2012-10-20: qty 2

## 2012-10-20 MED ORDER — SODIUM CHLORIDE 0.9 % IV BOLUS (SEPSIS)
1000.0000 mL | Freq: Once | INTRAVENOUS | Status: AC
  Administered 2012-10-20: 1000 mL via INTRAVENOUS

## 2012-10-20 MED ORDER — MORPHINE SULFATE 4 MG/ML IJ SOLN
4.0000 mg | Freq: Once | INTRAMUSCULAR | Status: DC
  Filled 2012-10-20: qty 1

## 2012-10-20 MED ORDER — PROMETHAZINE HCL 25 MG/ML IJ SOLN
25.0000 mg | Freq: Once | INTRAMUSCULAR | Status: AC
  Administered 2012-10-20: 25 mg via INTRAVENOUS
  Filled 2012-10-20: qty 1

## 2012-10-20 MED ORDER — IOHEXOL 300 MG/ML  SOLN
50.0000 mL | Freq: Once | INTRAMUSCULAR | Status: AC | PRN
  Administered 2012-10-20: 50 mL via ORAL

## 2012-10-20 NOTE — ED Notes (Signed)
Pt refused morphine, stated that it makes her mean, requesting anxiety medication, no s/so of anxiety noted, md made aware, pain medication given

## 2012-10-20 NOTE — ED Notes (Signed)
Midline lower abdominal incision, edges approximated, with bandage to right lower quadrant, pt refused to allow it to be removed, no drainage noted from midline incision, no redness noted

## 2012-10-20 NOTE — ED Notes (Signed)
tct patients husband notifing him of patients discharge,

## 2012-10-20 NOTE — ED Notes (Signed)
MD at bedside. 

## 2012-10-20 NOTE — ED Notes (Signed)
Pt reports that she attempted to contact surgeon via email and reported pain, md stated that it would be a few weeks before he could see her- per patient

## 2012-10-20 NOTE — ED Notes (Signed)
1 week ago pt had mesh removed from hernia repair, pain medication not controlling pain 8 days post op

## 2012-10-20 NOTE — ED Notes (Signed)
Pt ambulated to bathroom independently, requesting crackers and soda, pt tolerated po intake without difficulty, pt able to get to wheelchair without difficulty, asking appropriately questions requarding her follow up care, able to teachback information. Pt assisted to waiting room and awaiting arrival of husband

## 2012-10-20 NOTE — ED Provider Notes (Signed)
History     CSN: [id number]  Arrival date & time 10/20/12  0153   First MD Initiated Contact with Patient 10/20/12 0158      Chief Complaint  Patient presents with  . Abdominal Pain    (Consider location/radiation/quality/duration/timing/severity/associated sxs/prior treatment) Patient is a 36 y.o. female presenting with abdominal pain. The history is provided by the patient. No language interpreter was used.  Abdominal Pain The primary symptoms of the illness include abdominal pain. The primary symptoms of the illness do not include diarrhea. The current episode started more than 2 days ago. The onset of the illness was gradual. The problem has not changed since onset. The abdominal pain began more than 2 days ago. The pain came on gradually. The abdominal pain has been unchanged since its onset. The abdominal pain is generalized. The abdominal pain does not radiate. The severity of the abdominal pain is 10/10. The abdominal pain is relieved by nothing. Exacerbated by: nothing.  Associated with: recent surgery post op day 9 from mesh removal. The patient states that she believes she is currently not pregnant. The patient has not had a change in bowel habit. Risk factors for an acute abdominal problem include a history of abdominal surgery. Symptoms associated with the illness do not include chills, anorexia or diaphoresis. Significant associated medical issues do not include liver disease.    Past Medical History  Diagnosis Date  . ABDOMINAL WALL HERNIA 02/27/2010  . ALLERGIC RHINITIS 06/02/2007  . ANOREXIA, CHRONIC 08/07/2008  . ANXIETY 09/15/2010  . BACK PAIN, THORACIC REGION 07/07/2007  . BACK PAIN, THORACIC REGION 07/07/2007  . CERVICAL RADICULOPATHY 12/11/2008  . Condyloma acuminatum 04/23/2009  . CONSTIPATION 09/15/2010  . DYSPHAGIA UNSPECIFIED 09/09/2009  . 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 ENDOMETRIOSIS 12/16/2009  . FATIGUE 06/11/2010  . GERD 10/23/2009  . HERPES SIMPLEX INFECTION  06/11/2010  . LENTIGO 04/23/2009  . LUNG NODULE 06/17/2010  . Palpitations 10/23/2009  . 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 Panic disorder without agoraphobia 08/07/2008  . Stricture and stenosis of esophagus 05/13/2010  . TOBACCO ABUSE 08/07/2008  . Heart palpitations   . Urinary tract infection   . Fibromyalgia   . Rheumatoid arthritis   . Ovarian cyst   . Abnormal Pap smear   . Fibromyalgia     Past Surgical History  Procedure Date  . Partial hysterectomy   . Endometrial ablation   . Hernia repair     X3  . Dilation and curettage of uterus     x1   . Cesarean section     x2   . Esophageal dilatation x 4   . Gum surgery   . Abdominal surgery   . Abdominal wall mesh  removal     Family History  Problem Relation Age of Onset  . Depression Mother   . Arthritis Mother   . Hyperlipidemia Father   . Hypertension Father     History  Substance Use Topics  . Smoking status: Current Every Day Smoker -- 1.0 packs/day    Types: Cigarettes  . Smokeless tobacco: Never Used  . Alcohol Use: No    OB History    Grav Para Term Preterm Abortions TAB SAB Ect Mult Living   3 2 2  0 1 0 1 0 0 2      Review of Systems  Constitutional: Negative for chills and diaphoresis.  Gastrointestinal: Positive for abdominal pain. Negative for diarrhea and anorexia.  All other systems reviewed and are negative.  Allergies  Lamictal; Zofran; Chantix; Nicotine; Skelaxin; and Penicillins  Home Medications   Current Outpatient Rx  Name  Route  Sig  Dispense  Refill  . CLONAZEPAM 2 MG PO TABS   Oral   Take 1 mg by mouth 4 (four) times daily as needed. For anxiety         . ESCITALOPRAM OXALATE 20 MG PO TABS   Oral   Take 1 tablet (20 mg total) by mouth daily.   90 tablet   3   . HYDROMORPHONE HCL 4 MG PO TABS   Oral   Take 1 tablet (4 mg total) by mouth every 4 (four) hours as needed for pain.   10 tablet   0   . OXYCODONE-ACETAMINOPHEN 10-325 MG PO TABS    Oral   Take 1 tablet by mouth every 4 (four) hours as needed.         Marland Kitchen PROMETHAZINE HCL 25 MG PO TABS   Oral   Take 1 tablet (25 mg total) by mouth every 6 (six) hours as needed for nausea.   12 tablet   0   . ACETAMINOPHEN 325 MG PO TABS   Oral   Take 325 mg by mouth every 6 (six) hours as needed. For pain         . CYCLOBENZAPRINE HCL 10 MG PO TABS   Oral   Take 10 mg by mouth 3 (three) times daily as needed. For muscle spasms         . HYDROMORPHONE HCL 2 MG PO TABS   Oral   Take 2 mg by mouth every 4 (four) hours as needed. For pain         . TRAMADOL HCL 50 MG PO TABS   Oral   Take 50 mg by mouth every 6 (six) hours as needed. For pain           BP 92/64  Pulse 74  Temp 98.7 F (37.1 C) (Oral)  Resp 16  Ht 5\' 7"  (1.702 m)  Wt 150 lb (68.04 kg)  BMI 23.49 kg/m2  SpO2 98%  Physical Exam  Constitutional: She is oriented to person, place, and time. She appears well-developed and well-nourished. No distress.  HENT:  Head: Normocephalic and atraumatic.  Mouth/Throat: Oropharynx is clear and moist.  Eyes: Conjunctivae normal are normal. Pupils are equal, round, and reactive to light.  Neck: Normal range of motion. Neck supple.  Cardiovascular: Normal rate, regular rhythm and intact distal pulses.   Pulmonary/Chest: Effort normal and breath sounds normal. She has no wheezes. She has no rales.  Abdominal: Soft. Bowel sounds are normal. There is no rebound and no guarding.       Midline vertical incision CDI no warmth erythema or fluctuance  Musculoskeletal: Normal range of motion.  Neurological: She is alert and oriented to person, place, and time.  Skin: Skin is warm and dry.  Psychiatric: She has a normal mood and affect.    ED Course  Procedures (including critical care time)  Labs Reviewed  CBC WITH DIFFERENTIAL - Abnormal; Notable for the following:    Eosinophils Relative 11 (*)     Eosinophils Absolute 1.0 (*)     All other components within  normal limits  COMPREHENSIVE METABOLIC PANEL - Abnormal; Notable for the following:    Total Bilirubin 0.2 (*)     All other components within normal limits  URINALYSIS, ROUTINE W REFLEX MICROSCOPIC - Abnormal; Notable for the following:  APPearance CLOUDY (*)     All other components within normal limits  PREGNANCY, URINE   No results found.   No diagnosis found.    MDM  Received and reviewed medical records from [facility] regarding patient's recent mesh removal surgery.  Patient is demanding medication for anxiety in addition to pain medication.  Patient has not contacted the surgery service regarding worsening pain post operatively.     Pain is out of proportion to exam, lab and CT findings.    451 case d/w Dr. [name] of general surgery at [facility] have patient call in am to be seen.  All lab findings and CT scan reviewed with Dr. [name] via phone. Fluid is to be expected postoperatively and there is no indication for antibiotics given the CT findings.   In the post operative period [facility] surgery service should be managing her pain.  Please have the patient contact Dr. Roanna Banning office in the am to be seen.    Will give one additional dose of pain medication and will d/c.  Follow up with Dr. Lowell Guitar regarding rx for additional medication and close follow up  Patient is asleep on entrance to the room but arouses to verbal stimuli.  Comfortable appearing informed patient of lab and CT findings and the need to follow up Dr. Lowell Guitar of general surgery at [facility] for ongoing care.  Patient verbalizes understanding and agrees to follow up  Rayan Ines Smitty Cords, MD 10/20/12 [phone]

## 2012-10-25 ENCOUNTER — Emergency Department (HOSPITAL_BASED_OUTPATIENT_CLINIC_OR_DEPARTMENT_OTHER): Payer: 59

## 2012-10-25 ENCOUNTER — Emergency Department (HOSPITAL_BASED_OUTPATIENT_CLINIC_OR_DEPARTMENT_OTHER)
Admission: EM | Admit: 2012-10-25 | Discharge: 2012-10-25 | Disposition: A | Discharge status: Home / Self Care | Payer: 59 | Attending: Emergency Medicine | Admitting: Emergency Medicine

## 2012-10-25 ENCOUNTER — Encounter (HOSPITAL_BASED_OUTPATIENT_CLINIC_OR_DEPARTMENT_OTHER): Payer: Self-pay | Admitting: Emergency Medicine

## 2012-10-25 DIAGNOSIS — F410 Panic disorder [episodic paroxysmal anxiety] without agoraphobia: Secondary | ICD-10-CM | POA: Insufficient documentation

## 2012-10-25 DIAGNOSIS — R112 Nausea with vomiting, unspecified: Secondary | ICD-10-CM | POA: Insufficient documentation

## 2012-10-25 DIAGNOSIS — Z8709 Personal history of other diseases of the respiratory system: Secondary | ICD-10-CM | POA: Insufficient documentation

## 2012-10-25 DIAGNOSIS — G8928 Other chronic postprocedural pain: Secondary | ICD-10-CM | POA: Insufficient documentation

## 2012-10-25 DIAGNOSIS — Z79899 Other long term (current) drug therapy: Secondary | ICD-10-CM | POA: Insufficient documentation

## 2012-10-25 DIAGNOSIS — Z8719 Personal history of other diseases of the digestive system: Secondary | ICD-10-CM | POA: Insufficient documentation

## 2012-10-25 DIAGNOSIS — M069 Rheumatoid arthritis, unspecified: Secondary | ICD-10-CM | POA: Insufficient documentation

## 2012-10-25 DIAGNOSIS — R109 Unspecified abdominal pain: Secondary | ICD-10-CM | POA: Insufficient documentation

## 2012-10-25 DIAGNOSIS — Z8679 Personal history of other diseases of the circulatory system: Secondary | ICD-10-CM | POA: Insufficient documentation

## 2012-10-25 DIAGNOSIS — F411 Generalized anxiety disorder: Secondary | ICD-10-CM | POA: Insufficient documentation

## 2012-10-25 DIAGNOSIS — Z87440 Personal history of urinary (tract) infections: Secondary | ICD-10-CM | POA: Insufficient documentation

## 2012-10-25 DIAGNOSIS — F17200 Nicotine dependence, unspecified, uncomplicated: Secondary | ICD-10-CM | POA: Insufficient documentation

## 2012-10-25 DIAGNOSIS — Z8619 Personal history of other infectious and parasitic diseases: Secondary | ICD-10-CM | POA: Insufficient documentation

## 2012-10-25 DIAGNOSIS — Z8739 Personal history of other diseases of the musculoskeletal system and connective tissue: Secondary | ICD-10-CM | POA: Insufficient documentation

## 2012-10-25 HISTORY — DX: Panic disorder (episodic paroxysmal anxiety): F41.0

## 2012-10-25 HISTORY — DX: Opioid abuse, uncomplicated: F11.10

## 2012-10-25 LAB — COMPREHENSIVE METABOLIC PANEL
ALT: 18 U/L (ref 0–35)
AST: 19 U/L (ref 0–37)
Albumin: 4 g/dL (ref 3.5–5.2)
CO2: 25 mEq/L (ref 19–32)
Calcium: 9.4 mg/dL (ref 8.4–10.5)
Chloride: 103 mEq/L (ref 96–112)
GFR calc non Af Amer: >90 mL/min (ref >90)
Sodium: 141 mEq/L (ref 135–145)
Total Bilirubin: 0.2 mg/dL — ABNORMAL LOW (ref 0.3–1.2)

## 2012-10-25 LAB — CBC WITH DIFFERENTIAL/PLATELET
Basophils Absolute: 0 10*3/uL (ref 0.0–0.1)
Basophils Relative: 0 % (ref 0–1)
Eosinophils Absolute: 1 10*3/uL — ABNORMAL HIGH (ref 0.0–0.7)
Eosinophils Relative: 10 % — ABNORMAL HIGH (ref 0–5)
MCH: 32.3 pg (ref 26.0–34.0)
MCHC: 35.2 g/dL (ref 30.0–36.0)
MCV: 91.8 fL (ref 78.0–100.0)
Neutrophils Relative %: 51 % (ref 43–77)
Platelets: 255 10*3/uL (ref 150–400)
RBC: 4.39 MIL/uL (ref 3.87–5.11)
RDW: 11.9 % (ref 11.5–15.5)

## 2012-10-25 LAB — URINALYSIS, ROUTINE W REFLEX MICROSCOPIC
Glucose, UA: NEGATIVE mg/dL
Hgb urine dipstick: NEGATIVE
Leukocytes, UA: NEGATIVE
Protein, ur: NEGATIVE mg/dL
pH: 7 (ref 5.0–8.0)

## 2012-10-25 LAB — RAPID URINE DRUG SCREEN, HOSP PERFORMED
Amphetamines: NOT DETECTED
Benzodiazepines: POSITIVE — AB
Opiates: NOT DETECTED
Tetrahydrocannabinol: NOT DETECTED

## 2012-10-25 MED ORDER — IOHEXOL 300 MG/ML  SOLN
50.0000 mL | Freq: Once | INTRAMUSCULAR | Status: AC | PRN
  Administered 2012-10-25: 50 mL via ORAL

## 2012-10-25 MED ORDER — FENTANYL CITRATE 0.05 MG/ML IJ SOLN
100.0000 ug | Freq: Once | INTRAMUSCULAR | Status: AC
  Administered 2012-10-25: 100 ug via INTRAVENOUS
  Filled 2012-10-25: qty 2

## 2012-10-25 MED ORDER — SCOPOLAMINE 1 MG/3DAYS TD PT72: MEDICATED_PATCH | TRANSDERMAL | Status: DC

## 2012-10-25 MED ORDER — IOHEXOL 300 MG/ML  SOLN
100.0000 mL | Freq: Once | INTRAMUSCULAR | Status: AC | PRN
  Administered 2012-10-25: 100 mL via INTRAVENOUS

## 2012-10-25 MED ORDER — PROMETHAZINE HCL 25 MG/ML IJ SOLN
12.5000 mg | Freq: Once | INTRAMUSCULAR | Status: AC
  Administered 2012-10-25: 12.5 mg via INTRAVENOUS
  Filled 2012-10-25: qty 1

## 2012-10-25 NOTE — ED Notes (Signed)
Pt c/o abd pain at site of incision. Pt was seen here a few days ago for same

## 2012-10-25 NOTE — ED Notes (Signed)
Pt asking for more pain medication and ativan for anxiety. MD aware. No new orders received. Pt assisted on bedside commode and back to bed.

## 2012-10-25 NOTE — ED Provider Notes (Signed)
History     CSN: [id number]  Arrival date & time 10/25/12  0045   First MD Initiated Contact with Patient 10/25/12 0059      Chief Complaint  Patient presents with  . Abdominal Pain    (Consider location/radiation/quality/duration/timing/severity/associated sxs/prior treatment) HPI This is a 36 year old female with a history of chronic abdominal pain. She is 12 days status post revision of abdominal hernia that involved removing mesh. This is her second postoperative visit to this ED. She is complaining of severe pain left of her midline surgical incision that began suddenly about an hour ago. She has been on Percocet, having had 180 10 mg Percocets dispensed to her on the 24th of this month, 4 days ago. She states the Percocet does not help her pain and only makes her nauseated. She states she's been nauseated since her surgery. Her appetite has been very poor because of this. She has not vomited recently. She has not had a fever. The pain is worse with movement or palpation.  Her workup here on December 20 was unremarkable, including a no significant findings on CT scan.  Past Medical History  Diagnosis Date  . ABDOMINAL WALL HERNIA 02/27/2010  . ALLERGIC RHINITIS 06/02/2007  . ANOREXIA, CHRONIC 08/07/2008  . ANXIETY 09/15/2010  . BACK PAIN, THORACIC REGION 07/07/2007  . BACK PAIN, THORACIC REGION 07/07/2007  . CERVICAL RADICULOPATHY 12/11/2008  . Condyloma acuminatum 04/23/2009  . CONSTIPATION 09/15/2010  . DYSPHAGIA UNSPECIFIED 09/09/2009  . 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 ENDOMETRIOSIS 12/16/2009  . FATIGUE 06/11/2010  . GERD 10/23/2009  . HERPES SIMPLEX INFECTION 06/11/2010  . LENTIGO 04/23/2009  . LUNG NODULE 06/17/2010  . Palpitations 10/23/2009  . 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 Panic disorder without agoraphobia 08/07/2008  . Stricture and stenosis of esophagus 05/13/2010  . TOBACCO ABUSE 08/07/2008  . Heart palpitations   . Urinary tract infection   . Fibromyalgia    . Rheumatoid arthritis   . Ovarian cyst   . Abnormal Pap smear   . Fibromyalgia   . Narcotic abuse, continuous     Past Surgical History  Procedure Date  . Partial hysterectomy   . Endometrial ablation   . Hernia repair     X3  . Dilation and curettage of uterus     x1   . Cesarean section     x2   . Esophageal dilatation x 4   . Gum surgery   . Abdominal surgery   . Abdominal wall mesh  removal     Family History  Problem Relation Age of Onset  . Depression Mother   . Arthritis Mother   . Hyperlipidemia Father   . Hypertension Father     History  Substance Use Topics  . Smoking status: Current Every Day Smoker -- 1.0 packs/day    Types: Cigarettes  . Smokeless tobacco: Never Used  . Alcohol Use: No    OB History    Grav Para Term Preterm Abortions TAB SAB Ect Mult Living   3 2 2  0 1 0 1 0 0 2      Review of Systems  All other systems reviewed and are negative.    Allergies  Lamictal; Zofran; Chantix; Nicotine; Skelaxin; and Penicillins  Home Medications   Current Outpatient Rx  Name  Route  Sig  Dispense  Refill  . ACETAMINOPHEN 325 MG PO TABS   Oral   Take 325 mg by mouth every 6 (six) hours as needed. For pain         .  CLONAZEPAM 2 MG PO TABS   Oral   Take 1 mg by mouth 4 (four) times daily as needed. For anxiety         . CYCLOBENZAPRINE HCL 10 MG PO TABS   Oral   Take 10 mg by mouth 3 (three) times daily as needed. For muscle spasms         . ESCITALOPRAM OXALATE 20 MG PO TABS   Oral   Take 1 tablet (20 mg total) by mouth daily.   90 tablet   3   . HYDROMORPHONE HCL 2 MG PO TABS   Oral   Take 2 mg by mouth every 4 (four) hours as needed. For pain         . HYDROMORPHONE HCL 4 MG PO TABS   Oral   Take 1 tablet (4 mg total) by mouth every 4 (four) hours as needed for pain.   10 tablet   0   . OXYCODONE-ACETAMINOPHEN 10-325 MG PO TABS   Oral   Take 1 tablet by mouth every 4 (four) hours as needed.         Marland Kitchen  PROMETHAZINE HCL 25 MG PO TABS   Oral   Take 1 tablet (25 mg total) by mouth every 6 (six) hours as needed for nausea.   12 tablet   0   . TRAMADOL HCL 50 MG PO TABS   Oral   Take 50 mg by mouth every 6 (six) hours as needed. For pain           BP 97/61  Pulse 78  Temp 98 F (36.7 C) (Oral)  Resp 18  SpO2 99%  Physical Exam General: Well-developed, well-nourished female; appearance consistent with age of record HENT: normocephalic, atraumatic Eyes: pupils equal round and reactive to light; extraocular muscles intact Neck: supple Heart: regular rate and rhythm Lungs: clear to auscultation bilaterally Abdomen: soft; nondistended; diffuse tenderness most severe in the left abdomen; midline incision without erythema, warmth or drainage; bowel sounds present Extremities: No deformity; full range of motion Neurologic: Awake, alert; motor function intact in all extremities and symmetric; no facial droop Skin: Warm and dry Psychiatric: Crying incessantly; poor eye contact    ED Course  Procedures (including critical care time)    MDM   Nursing notes and vitals signs, including pulse oximetry, reviewed.  Summary of this visit's results, reviewed by myself:  Labs:  Results for orders placed during the hospital encounter of 10/25/12 (from the past 24 hour(s))  URINALYSIS, ROUTINE W REFLEX MICROSCOPIC     Status: Normal   Collection Time   10/25/12  1:54 AM      Component Value Range   Color, Urine YELLOW  YELLOW   APPearance CLEAR  CLEAR   Specific Gravity, Urine 1.011  1.005 - 1.030   pH 7.0  5.0 - 8.0   Glucose, UA NEGATIVE  NEGATIVE mg/dL   Hgb urine dipstick NEGATIVE  NEGATIVE   Bilirubin Urine NEGATIVE  NEGATIVE   Ketones, ur NEGATIVE  NEGATIVE mg/dL   Protein, ur NEGATIVE  NEGATIVE mg/dL   Urobilinogen, UA 0.2  0.0 - 1.0 mg/dL   Nitrite NEGATIVE  NEGATIVE   Leukocytes, UA NEGATIVE  NEGATIVE  URINE RAPID DRUG SCREEN (HOSP PERFORMED)     Status: Abnormal    Collection Time   10/25/12  1:54 AM      Component Value Range   Opiates NONE DETECTED  NONE DETECTED   Cocaine NONE DETECTED  NONE  DETECTED   Benzodiazepines POSITIVE (*) NONE DETECTED   Amphetamines NONE DETECTED  NONE DETECTED   Tetrahydrocannabinol NONE DETECTED  NONE DETECTED   Barbiturates NONE DETECTED  NONE DETECTED  COMPREHENSIVE METABOLIC PANEL     Status: Abnormal   Collection Time   10/25/12  2:24 AM      Component Value Range   Sodium 141  135 - 145 mEq/L   Potassium 4.4  3.5 - 5.1 mEq/L   Chloride 103  96 - 112 mEq/L   CO2 25  19 - 32 mEq/L   Glucose, Bld 91  70 - 99 mg/dL   BUN 9  6 - 23 mg/dL   Creatinine, Ser 1.61  0.50 - 1.10 mg/dL   Calcium 9.4  8.4 - 09.6 mg/dL   Total Protein 7.3  6.0 - 8.3 g/dL   Albumin 4.0  3.5 - 5.2 g/dL   AST 19  0 - 37 U/L   ALT 18  0 - 35 U/L   Alkaline Phosphatase 84  39 - 117 U/L   Total Bilirubin 0.2 (*) 0.3 - 1.2 mg/dL   GFR calc non Af Amer >90  >90 mL/min   GFR calc Af Amer >90  >90 mL/min  LIPASE, BLOOD     Status: Normal   Collection Time   10/25/12  2:24 AM      Component Value Range   Lipase 23  11 - 59 U/L  CBC WITH DIFFERENTIAL     Status: Abnormal   Collection Time   10/25/12  2:24 AM      Component Value Range   WBC 10.4  4.0 - 10.5 K/uL   RBC 4.39  3.87 - 5.11 MIL/uL   Hemoglobin 14.2  12.0 - 15.0 g/dL   HCT 04.5  40.9 - 81.1 %   MCV 91.8  78.0 - 100.0 fL   MCH 32.3  26.0 - 34.0 pg   MCHC 35.2  30.0 - 36.0 g/dL   RDW 91.4  78.2 - 95.6 %   Platelets 255  150 - 400 K/uL   Neutrophils Relative 51  43 - 77 %   Neutro Abs 5.3  1.7 - 7.7 K/uL   Lymphocytes Relative 31  12 - 46 %   Lymphs Abs 3.2  0.7 - 4.0 K/uL   Monocytes Relative 8  3 - 12 %   Monocytes Absolute 0.9  0.1 - 1.0 K/uL   Eosinophils Relative 10 (*) 0 - 5 %   Eosinophils Absolute 1.0 (*) 0.0 - 0.7 K/uL   Basophils Relative 0  0 - 1 %   Basophils Absolute 0.0  0.0 - 0.1 K/uL    Imaging Studies: Ct Abdomen Pelvis W Contrast  10/25/2012   *RADIOLOGY REPORT*  Clinical Data: Left lower quadrant abdominal pain; 2 weeks status post removal of hernia mesh repair.  CT ABDOMEN AND PELVIS WITH CONTRAST  Technique:  Multidetector CT imaging of the abdomen and pelvis was performed following the standard protocol during bolus administration of intravenous contrast.  Contrast: 100 mL of Omnipaque 300 IV contrast  Comparison: CT of the abdomen and pelvis performed 10/20/2012  Findings: The visualized lung bases are clear.  The liver and spleen are unremarkable in appearance.  The gallbladder is within normal limits.  The pancreas and adrenal glands are unremarkable.  A tiny cyst is noted at the interpole region of the right kidney; the kidneys are otherwise unremarkable in appearance.  There is no evidence of hydronephrosis.  No renal or ureteral stones are seen. No perinephric stranding is appreciated.  No free fluid is identified.  The small bowel is unremarkable in appearance.  The stomach is within normal limits.  No acute vascular abnormalities are seen.  The appendix is normal in caliber without evidence for appendicitis.  Residual contrast is noted within the transverse, descending and sigmoid colon.  The colon is unremarkable in appearance.  The previously noted fluid collection along the anterior midline abdomen has mildly decreased in size, though still seen, with mild persistent soft tissue inflammation.  Previously noted small foci of air within the fluid collection have significantly improved.  Mildly prominent pericecal nodes are nonspecific in appearance.  The bladder is mildly distended and grossly unremarkable in appearance, slightly displaced by the cecum.  The patient is status post hysterectomy; no suspicious adnexal masses are seen.  The ovaries are grossly symmetric; no suspicious adnexal masses are seen.  No inguinal lymphadenopathy is seen.  No acute osseous abnormalities are identified.  IMPRESSION:  1.  Previously noted fluid collection  along the anterior midline abdomen has decreased mildly in size, with mild persistent surrounding soft tissue inflammation.  2.  Mild nonspecific prominence of pericecal nodes. 3.  Tiny right renal cyst seen. 4.  Otherwise unremarkable CT of the abdomen and pelvis.   Original Report Authenticated By: Tonia Ghent, M.D.    4:50 AM The patient was advised of her CT findings which are unremarkable given her postoperative status. As noted the patient received 180 10 mg Percocet tablets 4 days ago. The patient says that Percocet does not work for her however this is not the first prescription for Percocet she is gotten from her surgeon and she was previously on Percocet for several months earlier this year, later being placed on hydromorphone.  The patient vehemently denies having a history of chronic pain and he also admits she was on Percocet for 7 years for pain. She insists her current pain is strictly postoperative. As noted on previous visits her pain is out of proportion to physical findings as well as laboratory and radiology findings. The patient requests scopolamine patches for control of her nausea. We will comply. She was advised that the emergency department is not the appropriate venue for treatment of chronic pain; if she has an issue with her postoperative pain management she needs to discuss this with her surgeon as was discussed with her on her most recent visit.         Hanley Seamen, MD 10/25/12 [phone]

## 2012-10-25 NOTE — ED Notes (Signed)
Pt denies nausea now. Pt given drink per request. Husband here for ride home

## 2012-11-11 DIAGNOSIS — R109 Unspecified abdominal pain: Secondary | ICD-10-CM | POA: Insufficient documentation

## 2012-11-11 DIAGNOSIS — G894 Chronic pain syndrome: Secondary | ICD-10-CM | POA: Insufficient documentation

## 2012-11-11 DIAGNOSIS — M533 Sacrococcygeal disorders, not elsewhere classified: Secondary | ICD-10-CM | POA: Insufficient documentation

## 2012-11-15 DIAGNOSIS — Z9889 Other specified postprocedural states: Secondary | ICD-10-CM | POA: Insufficient documentation

## 2012-11-15 DIAGNOSIS — K432 Incisional hernia without obstruction or gangrene: Secondary | ICD-10-CM | POA: Insufficient documentation

## 2012-11-15 DIAGNOSIS — Z8719 Personal history of other diseases of the digestive system: Secondary | ICD-10-CM | POA: Insufficient documentation

## 2012-11-17 ENCOUNTER — Emergency Department (HOSPITAL_COMMUNITY)
Admission: EM | Admit: 2012-11-17 | Discharge: 2012-11-17 | Disposition: A | Discharge status: Home / Self Care | Payer: 59 | Attending: Emergency Medicine | Admitting: Emergency Medicine

## 2012-11-17 ENCOUNTER — Encounter (HOSPITAL_COMMUNITY): Payer: Self-pay

## 2012-11-17 DIAGNOSIS — R5381 Other malaise: Secondary | ICD-10-CM | POA: Insufficient documentation

## 2012-11-17 DIAGNOSIS — Z8742 Personal history of other diseases of the female genital tract: Secondary | ICD-10-CM | POA: Insufficient documentation

## 2012-11-17 DIAGNOSIS — Z87440 Personal history of urinary (tract) infections: Secondary | ICD-10-CM | POA: Insufficient documentation

## 2012-11-17 DIAGNOSIS — R5383 Other fatigue: Secondary | ICD-10-CM | POA: Insufficient documentation

## 2012-11-17 DIAGNOSIS — F17200 Nicotine dependence, unspecified, uncomplicated: Secondary | ICD-10-CM | POA: Insufficient documentation

## 2012-11-17 DIAGNOSIS — Z8679 Personal history of other diseases of the circulatory system: Secondary | ICD-10-CM | POA: Insufficient documentation

## 2012-11-17 DIAGNOSIS — M542 Cervicalgia: Secondary | ICD-10-CM | POA: Insufficient documentation

## 2012-11-17 DIAGNOSIS — F410 Panic disorder [episodic paroxysmal anxiety] without agoraphobia: Secondary | ICD-10-CM | POA: Insufficient documentation

## 2012-11-17 DIAGNOSIS — M791 Myalgia, unspecified site: Secondary | ICD-10-CM

## 2012-11-17 DIAGNOSIS — Z79899 Other long term (current) drug therapy: Secondary | ICD-10-CM | POA: Insufficient documentation

## 2012-11-17 DIAGNOSIS — F341 Dysthymic disorder: Secondary | ICD-10-CM | POA: Insufficient documentation

## 2012-11-17 DIAGNOSIS — IMO0001 Reserved for inherently not codable concepts without codable children: Secondary | ICD-10-CM | POA: Insufficient documentation

## 2012-11-17 DIAGNOSIS — Z8719 Personal history of other diseases of the digestive system: Secondary | ICD-10-CM | POA: Insufficient documentation

## 2012-11-17 DIAGNOSIS — R52 Pain, unspecified: Secondary | ICD-10-CM | POA: Insufficient documentation

## 2012-11-17 DIAGNOSIS — Z8619 Personal history of other infectious and parasitic diseases: Secondary | ICD-10-CM | POA: Insufficient documentation

## 2012-11-17 DIAGNOSIS — Z8739 Personal history of other diseases of the musculoskeletal system and connective tissue: Secondary | ICD-10-CM | POA: Insufficient documentation

## 2012-11-17 MED ORDER — OXYCODONE-ACETAMINOPHEN 5-325 MG PO TABS
2.0000 | ORAL_TABLET | Freq: Once | ORAL | Status: AC
  Administered 2012-11-17: 2 via ORAL
  Filled 2012-11-17: qty 2

## 2012-11-17 MED ORDER — CYCLOBENZAPRINE HCL 10 MG PO TABS: 10.0000 mg | ORAL_TABLET | Freq: Once | ORAL | Status: DC

## 2012-11-17 MED ORDER — CYCLOBENZAPRINE HCL 10 MG PO TABS: 10.0000 mg | ORAL_TABLET | Freq: Two times a day (BID) | ORAL | Status: DC | PRN

## 2012-11-17 MED ORDER — HYDROMORPHONE HCL PF 1 MG/ML IJ SOLN
1.0000 mg | Freq: Once | INTRAMUSCULAR | Status: AC
  Administered 2012-11-17: 1 mg via INTRAVENOUS
  Filled 2012-11-17: qty 1

## 2012-11-17 MED ORDER — KETOROLAC TROMETHAMINE 60 MG/2ML IM SOLN: 60.0000 mg | Freq: Once | INTRAMUSCULAR | Status: DC

## 2012-11-17 MED ORDER — DIAZEPAM 5 MG PO TABS
10.0000 mg | ORAL_TABLET | Freq: Once | ORAL | Status: AC
  Administered 2012-11-17: 10 mg via ORAL
  Filled 2012-11-17 (×2): qty 1

## 2012-11-17 NOTE — ED Notes (Signed)
Pt transferred herself to the stretcher with difficulty. States she slept in a chair all night with her daughter who is a pt in Pod C. States she "hurts all over". Hx of fibromyalgia and RA. Sees Dr. [name].

## 2012-11-17 NOTE — ED Provider Notes (Signed)
History   This chart was scribed for non-physician practitioner working with Carleene Cooper III, MD by Frederik Pear, ED Scribe. This patient was seen in room TR08C/TR08C and the patient's care was started at 1430.   CSN: [id number]  Arrival date & time 11/17/12  1257   First MD Initiated Contact with Patient 11/17/12 1430      Chief Complaint  Patient presents with  . Back Pain    (Consider location/radiation/quality/duration/timing/severity/associated sxs/prior treatment) HPI  NOEMIE DEVIVO is a 37 y.o. female  with a hx of substance abuse, fibromyalgia, chronic back pain, endometriosis, hernia, and rheumatoid arthritis presents to the Emergency Department complaining of gradual, persistent, waxing and waning, 8/10, sharp back pain with associated neck and ankle pain, generalized weakness, and muscle cramps that began several days ago but has gotten progressively worse than baseline today. She states that the current pain is consistent with her fibromyalgia, which is typically improved with Flexeril, which she is currently out of. She reports that she took her 10-325 mg oxycodone this morning with no relief. She denies any fever, chills, congestion, chest pain, SOB, nausea, emesis, diarrhea, bladder or bowel incontinence, or paresthesia. She denies that anything improves or aggravates that pain. She reports that she is ambulatory, but has fallen twice since yesterday without injury.    Past Medical History  Diagnosis Date  . ABDOMINAL WALL HERNIA 02/27/2010  . ALLERGIC RHINITIS 06/02/2007  . ANOREXIA, CHRONIC 08/07/2008  . ANXIETY 09/15/2010  . BACK PAIN, THORACIC REGION 07/07/2007  . CERVICAL RADICULOPATHY 12/11/2008  . Condyloma acuminatum 04/23/2009  . CONSTIPATION 09/15/2010  . DYSPHAGIA UNSPECIFIED 09/09/2009  . 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 ENDOMETRIOSIS 12/16/2009  . FATIGUE 06/11/2010  . GERD 10/23/2009  . HERPES SIMPLEX INFECTION 06/11/2010  . LENTIGO 04/23/2009  . LUNG NODULE  06/17/2010  . Palpitations 10/23/2009  . Stricture and stenosis of esophagus 05/13/2010  . TOBACCO ABUSE 08/07/2008  . Heart palpitations   . Urinary tract infection   . Rheumatoid arthritis   . Ovarian cyst   . Abnormal Pap smear   . Fibromyalgia   . Narcotic abuse, continuous   . Panic disorder     Past Surgical History  Procedure Date  . Partial hysterectomy   . Endometrial ablation   . Hernia repair     X3  . Dilation and curettage of uterus     x1   . Cesarean section     x2   . Esophageal dilatation x 4   . Gum surgery   . Abdominal surgery   . Abdominal wall mesh  removal     Family History  Problem Relation Age of Onset  . Depression Mother   . Arthritis Mother   . Hyperlipidemia Father   . Hypertension Father     History  Substance Use Topics  . Smoking status: Current Every Day Smoker -- 1.0 packs/day    Types: Cigarettes  . Smokeless tobacco: Never Used  . Alcohol Use: No    OB History    Grav Para Term Preterm Abortions TAB SAB Ect Mult Living   3 2 2  0 1 0 1 0 0 2      Review of Systems  Constitutional: Negative for fever, diaphoresis, appetite change, fatigue and unexpected weight change.  HENT: Positive for neck pain. Negative for mouth sores and neck stiffness.   Eyes: Negative for visual disturbance.  Respiratory: Negative for cough, chest tightness, shortness of breath and wheezing.   Cardiovascular: Negative  for chest pain.  Gastrointestinal: Negative for nausea, vomiting, abdominal pain, diarrhea and constipation.  Genitourinary: Negative for dysuria, urgency, frequency and hematuria.  Musculoskeletal: Positive for back pain.       Ankle pain.  Skin: Negative for rash.  Neurological: Positive for weakness. Negative for syncope, light-headedness and headaches.  Hematological: Does not bruise/bleed easily.  Psychiatric/Behavioral: Negative for sleep disturbance. The patient is not nervous/anxious.   All other systems reviewed and are  negative.    Allergies  Lamictal; Zofran; Chantix; Nicotine; Skelaxin; and Penicillins  Home Medications   Current Outpatient Rx  Name  Route  Sig  Dispense  Refill  . ALPRAZOLAM 1 MG PO TABS   Oral   Take 1 mg by mouth at bedtime as needed. For anxiety.         . ESCITALOPRAM OXALATE 20 MG PO TABS   Oral   Take 1 tablet (20 mg total) by mouth daily.   90 tablet   3   . OXYCODONE-ACETAMINOPHEN 10-325 MG PO TABS   Oral   Take 1 tablet by mouth every 4 (four) hours as needed.         Marland Kitchen POLYETHYLENE GLYCOL 3350 PO PACK   Oral   Take 17 g by mouth daily. For constipation.         . CYCLOBENZAPRINE HCL 10 MG PO TABS   Oral   Take 1 tablet (10 mg total) by mouth 2 (two) times daily as needed for muscle spasms.   20 tablet   0   . SCOPOLAMINE BASE 1.5 MG TD PT72      Place 1 patch behind the ear and leave in place for 3 days. Removal patch before placing the patch.   10 patch   0     BP 112/78  Pulse 101  Temp 98.4 F (36.9 C) (Oral)  Resp 18  SpO2 100%  Physical Exam  Nursing note and vitals reviewed. Constitutional: She is oriented to person, place, and time. She appears well-developed and well-nourished. No distress.  HENT:  Head: Normocephalic and atraumatic.  Mouth/Throat: Oropharynx is clear and moist. No oropharyngeal exudate.  Eyes: Conjunctivae normal and EOM are normal. Pupils are equal, round, and reactive to light. No scleral icterus.  Neck: Normal range of motion and full passive range of motion without pain. Neck supple. No spinous process tenderness and no muscular tenderness present. Normal range of motion present. No Brudzinski's sign and no Kernig's sign noted.  Cardiovascular: Normal rate, regular rhythm, normal heart sounds and intact distal pulses.  Exam reveals no gallop and no friction rub.   No murmur heard. Pulmonary/Chest: Effort normal and breath sounds normal. No respiratory distress. She has no wheezes. She has no rales. She  exhibits no tenderness.  Abdominal: Soft. Bowel sounds are normal. She exhibits no distension and no mass. There is generalized tenderness. There is no rigidity, no rebound, no guarding, no CVA tenderness, no tenderness at McBurney's point and negative Murphy's sign.       Mild tend to palp througout pt sates this is normal baseline for her  Well healed surgical scar from her umbil to pub.   Musculoskeletal: Normal range of motion. She exhibits tenderness (over her entire body). She exhibits no edema.       Pt will full ROM in all major joints but states all joints are painful  Lymphadenopathy:    She has no cervical adenopathy.  Neurological: She is alert and oriented to person, place,  and time. She has normal reflexes. No cranial nerve deficit. She exhibits normal muscle tone. Coordination normal.       Speech is clear and goal oriented Moves extremities without ataxia  Her reflexes are intact. She has 5/5 strength in her upper and lower extremities. She has strong and equal dorsiflexion, plantarflexion, and grip strength.   She is able to stand without assistance. She has difficulty ambulating that is secondary to pain. Her sensation is intact.     Skin: Skin is warm and dry. No rash noted. She is not diaphoretic. No erythema.  Psychiatric: She has a normal mood and affect.    ED Course  Procedures (including critical care time)  DIAGNOSTIC STUDIES: Oxygen Saturation is 100% on room air, normal by my interpretation.    COORDINATION OF CARE:  16:00- discussed planned course of treatment with the patient, including pain medication and a muscle relaxer, who is agreeable at this time.  16:15-  Medication Orders- oxycodone- acetaminophen (Percocet/roxicet) 5-325 mg per tablet 2 tablet- Once, diazepam (Valium) tablet 10 mg- Once.  Labs Reviewed - No data to display No results found.   1. Myalgia   2. Pain, generalized   3. H/O fibromyalgia       MDM  KANDISE RIEHLE presents  with generalized pain including back pain.   No neurological deficits and normal neuro exam.  Patient can walk but states is painful.  No loss of bowel or bladder control.  No concern for cauda equina.  No fever, night sweats, weight loss, h/o cancer.  RICE protocol and pain medicine indicated and discussed with patient.  Pt pain and muscle spasm treated in the department.  Pt adamant that she needs dilaudid, IV muscle relaxer, phenergan and something to eat.  He states the medications have not helped her at all.   We'll give Dilaudid IM and discharged home. Pt will be given rx for flexeril but no pain medications as she already had Rxs for those. I have also discussed reasons to return immediately to the ER.  Patient expresses understanding..  1. Medications: flexeril, usual home medications 2. Treatment: rest, drink plenty of fluids, take medications as prescribed, alternate ice and head 3. Follow Up: Please followup with your primary doctor for discussion of your diagnoses and further evaluation after today's visit; if you do not have a primary care doctor use the resource guide provided to find one  I personally performed the services described in this documentation, which was scribed in my presence. The recorded information has been reviewed and is accurate.  Dahlia Client Nicolette Gieske, PA-C 11/17/12 1740

## 2012-11-17 NOTE — ED Notes (Signed)
Pt. Came into Triage an layed down on the floor.  Pt. Reports that she has fibromyalgia and she is very tired, weak and pain all over.  Her Daughter is in Pod C.

## 2012-11-17 NOTE — ED Notes (Signed)
Pt,. Having leg spasms.  She took Oxycontin 10 mg before coming to ED.  Explained to pt. To massage her lt. Calf . Helps to decrease spasms

## 2012-11-18 NOTE — ED Provider Notes (Signed)
Medical screening examination/treatment/procedure(s) were performed by non-physician practitioner and as supervising physician I was immediately available for consultation/collaboration.   Carleene Cooper III, MD 11/18/12 [phone]

## 2012-12-12 ENCOUNTER — Telehealth: Payer: Self-pay | Admitting: Internal Medicine

## 2012-12-12 NOTE — Telephone Encounter (Signed)
Pt  Talked about meds she has been on and did find the script for butrans patches that dr Lovell Sheehan printed for her-she will get information and let us know if she wants it,if ok with dr Lovell Sheehan

## 2012-12-12 NOTE — Telephone Encounter (Signed)
Patient called stating that she would like an appt with Dr. Lovell Sheehan asap patient states she need med changed and she is in pain post major surgery in December- mesh and 37 staples taken out of stomach as she had two kinks of mesh in her stomach. Patient was put on pain meds. Patient's fibromyalgia is worsening. Please advise and assist. Patient states she is about to start going into detox on her own and she took herself off of meds without the MD knowing. Patient states she is going through withdrawals.

## 2012-12-18 NOTE — Telephone Encounter (Signed)
Discussed with dr Lovell Sheehan and butrans is what he wants her to try if she is not on anyother narcotic for pain

## 2012-12-22 ENCOUNTER — Other Ambulatory Visit: Payer: Self-pay | Admitting: *Deleted

## 2012-12-22 MED ORDER — BUPRENORPHINE 20 MCG/HR TD PTWK: 20.0000 ug | MEDICATED_PATCH | TRANSDERMAL | Status: DC

## 2013-02-14 ENCOUNTER — Telehealth: Payer: Self-pay | Admitting: Internal Medicine

## 2013-02-14 MED ORDER — BUPRENORPHINE 20 MCG/HR TD PTWK: 20.0000 ug | MEDICATED_PATCH | TRANSDERMAL | Status: DC

## 2013-02-14 MED ORDER — CYCLOBENZAPRINE HCL 10 MG PO TABS: 10.0000 mg | ORAL_TABLET | Freq: Two times a day (BID) | ORAL | Status: DC | PRN

## 2013-02-14 NOTE — Telephone Encounter (Signed)
Printed and will call pt to pickup after dr Lovell Sheehan signs-flexeril sent in

## 2013-02-14 NOTE — Telephone Encounter (Signed)
Patient called stating that she need a refill of her butran patches  20mg  1 patch onto skin once per week and also her flexeril 10 mg 1po tid sent to [facility] [address].  Please assist.

## 2013-02-16 ENCOUNTER — Emergency Department (HOSPITAL_COMMUNITY)
Admission: EM | Admit: 2013-02-16 | Discharge: 2013-02-17 | Disposition: A | Discharge status: Home / Self Care | Payer: 59 | Attending: Emergency Medicine | Admitting: Emergency Medicine

## 2013-02-16 ENCOUNTER — Encounter (HOSPITAL_COMMUNITY): Payer: Self-pay | Admitting: *Deleted

## 2013-02-16 DIAGNOSIS — M5380 Other specified dorsopathies, site unspecified: Secondary | ICD-10-CM | POA: Insufficient documentation

## 2013-02-16 DIAGNOSIS — M797 Fibromyalgia: Secondary | ICD-10-CM

## 2013-02-16 DIAGNOSIS — M436 Torticollis: Secondary | ICD-10-CM | POA: Insufficient documentation

## 2013-02-16 DIAGNOSIS — Z8709 Personal history of other diseases of the respiratory system: Secondary | ICD-10-CM | POA: Insufficient documentation

## 2013-02-16 DIAGNOSIS — Z8719 Personal history of other diseases of the digestive system: Secondary | ICD-10-CM | POA: Insufficient documentation

## 2013-02-16 DIAGNOSIS — M542 Cervicalgia: Secondary | ICD-10-CM | POA: Insufficient documentation

## 2013-02-16 DIAGNOSIS — Z8619 Personal history of other infectious and parasitic diseases: Secondary | ICD-10-CM | POA: Insufficient documentation

## 2013-02-16 DIAGNOSIS — Z872 Personal history of diseases of the skin and subcutaneous tissue: Secondary | ICD-10-CM | POA: Insufficient documentation

## 2013-02-16 DIAGNOSIS — Z79899 Other long term (current) drug therapy: Secondary | ICD-10-CM | POA: Insufficient documentation

## 2013-02-16 DIAGNOSIS — Z87440 Personal history of urinary (tract) infections: Secondary | ICD-10-CM | POA: Insufficient documentation

## 2013-02-16 DIAGNOSIS — M62830 Muscle spasm of back: Secondary | ICD-10-CM

## 2013-02-16 DIAGNOSIS — M069 Rheumatoid arthritis, unspecified: Secondary | ICD-10-CM | POA: Insufficient documentation

## 2013-02-16 DIAGNOSIS — Z8679 Personal history of other diseases of the circulatory system: Secondary | ICD-10-CM | POA: Insufficient documentation

## 2013-02-16 DIAGNOSIS — R112 Nausea with vomiting, unspecified: Secondary | ICD-10-CM | POA: Insufficient documentation

## 2013-02-16 DIAGNOSIS — F411 Generalized anxiety disorder: Secondary | ICD-10-CM | POA: Insufficient documentation

## 2013-02-16 DIAGNOSIS — IMO0001 Reserved for inherently not codable concepts without codable children: Secondary | ICD-10-CM | POA: Insufficient documentation

## 2013-02-16 DIAGNOSIS — F17200 Nicotine dependence, unspecified, uncomplicated: Secondary | ICD-10-CM | POA: Insufficient documentation

## 2013-02-16 DIAGNOSIS — Z8739 Personal history of other diseases of the musculoskeletal system and connective tissue: Secondary | ICD-10-CM | POA: Insufficient documentation

## 2013-02-16 DIAGNOSIS — Z8669 Personal history of other diseases of the nervous system and sense organs: Secondary | ICD-10-CM | POA: Insufficient documentation

## 2013-02-16 DIAGNOSIS — Z8742 Personal history of other diseases of the female genital tract: Secondary | ICD-10-CM | POA: Insufficient documentation

## 2013-02-16 LAB — CBC WITH DIFFERENTIAL/PLATELET
Basophils Absolute: 0 10*3/uL (ref 0.0–0.1)
Basophils Relative: 0 % (ref 0–1)
Eosinophils Absolute: 0.3 10*3/uL (ref 0.0–0.7)
Hemoglobin: 14.6 g/dL (ref 12.0–15.0)
MCH: 31.9 pg (ref 26.0–34.0)
MCHC: 36 g/dL (ref 30.0–36.0)
Neutro Abs: 2.5 10*3/uL (ref 1.7–7.7)
Neutrophils Relative %: 42 % — ABNORMAL LOW (ref 43–77)
Platelets: 210 10*3/uL (ref 150–400)
RBC: 4.57 MIL/uL (ref 3.87–5.11)

## 2013-02-16 MED ORDER — SODIUM CHLORIDE 0.9 % IV BOLUS (SEPSIS)
1000.0000 mL | Freq: Once | INTRAVENOUS | Status: AC
  Administered 2013-02-17: 1000 mL via INTRAVENOUS

## 2013-02-16 MED ORDER — PROMETHAZINE HCL 25 MG/ML IJ SOLN
25.0000 mg | Freq: Once | INTRAMUSCULAR | Status: AC
  Administered 2013-02-17: 25 mg via INTRAVENOUS
  Filled 2013-02-16: qty 1

## 2013-02-16 MED ORDER — DIAZEPAM 2 MG PO TABS
2.0000 mg | ORAL_TABLET | Freq: Once | ORAL | Status: DC
  Filled 2013-02-16: qty 1

## 2013-02-16 MED ORDER — KETOROLAC TROMETHAMINE 30 MG/ML IJ SOLN
30.0000 mg | Freq: Once | INTRAMUSCULAR | Status: AC
  Administered 2013-02-17: 30 mg via INTRAVENOUS
  Filled 2013-02-16: qty 1

## 2013-02-16 NOTE — ED Notes (Addendum)
C/o abd pain, nv, body aches, body pain & "muscles tight & spasming also sharp", (denies: fever diarrhea or bleeding), onset Friday morning. Last emesis 10 minutes ago.

## 2013-02-17 LAB — URINALYSIS, MICROSCOPIC ONLY
Bilirubin Urine: NEGATIVE
Glucose, UA: NEGATIVE mg/dL
Hgb urine dipstick: NEGATIVE
Ketones, ur: NEGATIVE mg/dL
Leukocytes, UA: NEGATIVE
pH: 6.5 (ref 5.0–8.0)

## 2013-02-17 LAB — COMPREHENSIVE METABOLIC PANEL
ALT: 14 U/L (ref 0–35)
AST: 17 U/L (ref 0–37)
Albumin: 3.8 g/dL (ref 3.5–5.2)
Alkaline Phosphatase: 81 U/L (ref 39–117)
Chloride: 103 mEq/L (ref 96–112)
Potassium: 4.1 mEq/L (ref 3.5–5.1)
Sodium: 138 mEq/L (ref 135–145)
Total Bilirubin: 0.2 mg/dL — ABNORMAL LOW (ref 0.3–1.2)
Total Protein: 6.6 g/dL (ref 6.0–8.3)

## 2013-02-17 MED ORDER — LORAZEPAM 2 MG/ML IJ SOLN
2.0000 mg | Freq: Once | INTRAMUSCULAR | Status: AC
  Administered 2013-02-17: 2 mg via INTRAVENOUS
  Filled 2013-02-17: qty 1

## 2013-02-17 MED ORDER — HYDROMORPHONE HCL 2 MG PO TABS: 2.0000 mg | ORAL_TABLET | Freq: Once | ORAL | Status: DC

## 2013-02-17 MED ORDER — HYDROMORPHONE HCL PF 1 MG/ML IJ SOLN: 1.0000 mg | INTRAMUSCULAR | Status: DC

## 2013-02-17 MED ORDER — SODIUM CHLORIDE 0.9 % IV BOLUS (SEPSIS)
500.0000 mL | Freq: Once | INTRAVENOUS | Status: AC
  Administered 2013-02-17: 500 mL via INTRAVENOUS

## 2013-02-17 MED ORDER — DIAZEPAM 5 MG PO TABS: 5.0000 mg | ORAL_TABLET | Freq: Two times a day (BID) | ORAL | Status: DC

## 2013-02-17 MED ORDER — HYDROMORPHONE HCL PF 2 MG/ML IJ SOLN
2.0000 mg | Freq: Once | INTRAMUSCULAR | Status: AC
  Administered 2013-02-17: 2 mg via INTRAVENOUS
  Filled 2013-02-17: qty 1

## 2013-02-17 NOTE — ED Notes (Signed)
Removed pain patch from right shoulder before administering dilaudid and ativan.

## 2013-02-17 NOTE — ED Provider Notes (Addendum)
History     CSN: [id number]  Arrival date & time 02/16/13  1947   First MD Initiated Contact with Patient 02/16/13 2346      Chief Complaint  Patient presents with  . Nausea  . Emesis  . Generalized Body Aches    (Consider location/radiation/quality/duration/timing/severity/associated sxs/prior treatment) HPI Comments: Pt with fibromyalgia, RA on buprenorphine patch, 20 mcg/hr, comes in with cc of neck pain, body aches, stiffness. Pt states that she started having these sx within the past 1-2 days. The pain is severe, and she has significant spasms all over her neck and back, she is unable to move due to that. She has had hx of similar sx in the past, just not as severe. She also reports pain in the neck that shoots down her spine with any movement. No associated new numbness, weakness, urinary incontinence, urinary retention, bowel incontinence, saddle anesthesia.  Pt s on the transdermal patch, which is changed every week, and is due for change tomorrow.    The history is provided by the patient.    Past Medical History  Diagnosis Date  . ABDOMINAL WALL HERNIA 02/27/2010  . ALLERGIC RHINITIS 06/02/2007  . ANOREXIA, CHRONIC 08/07/2008  . ANXIETY 09/15/2010  . BACK PAIN, THORACIC REGION 07/07/2007  . CERVICAL RADICULOPATHY 12/11/2008  . Condyloma acuminatum 04/23/2009  . CONSTIPATION 09/15/2010  . DYSPHAGIA UNSPECIFIED 09/09/2009  . 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 ENDOMETRIOSIS 12/16/2009  . FATIGUE 06/11/2010  . GERD 10/23/2009  . HERPES SIMPLEX INFECTION 06/11/2010  . LENTIGO 04/23/2009  . LUNG NODULE 06/17/2010  . Palpitations 10/23/2009  . Stricture and stenosis of esophagus 05/13/2010  . TOBACCO ABUSE 08/07/2008  . Heart palpitations   . Urinary tract infection   . Rheumatoid arthritis   . Ovarian cyst   . Abnormal Pap smear   . Fibromyalgia   . Narcotic abuse, continuous   . Panic disorder     Past Surgical History  Procedure Laterality Date  . Partial hysterectomy     . Endometrial ablation    . Hernia repair      X3  . Dilation and curettage of uterus      x1   . Cesarean section      x2   . Esophageal dilatation x 4    . Gum surgery    . Abdominal surgery    . Abdominal wall mesh  removal      Family History  Problem Relation Age of Onset  . Depression Mother   . Arthritis Mother   . Hyperlipidemia Father   . Hypertension Father     History  Substance Use Topics  . Smoking status: Current Every Day Smoker -- 1.00 packs/day    Types: Cigarettes  . Smokeless tobacco: Never Used  . Alcohol Use: No    OB History   Grav Para Term Preterm Abortions TAB SAB Ect Mult Living   3 2 2  0 1 0 1 0 0 2      Review of Systems  Constitutional: Positive for activity change.  HENT: Positive for neck pain and neck stiffness.   Respiratory: Negative for cough, shortness of breath and wheezing.   Cardiovascular: Negative for chest pain.  Gastrointestinal: Negative for nausea, vomiting, abdominal pain, diarrhea, constipation, blood in stool and abdominal distention.  Genitourinary: Negative for hematuria and difficulty urinating.  Musculoskeletal: Positive for myalgias, back pain and arthralgias.  Skin: Negative for color change.  Neurological: Negative for speech difficulty.  Hematological: Does not bruise/bleed  easily.  Psychiatric/Behavioral: Negative for confusion.    Allergies  Lamictal; Zofran; Chantix; Ibuprofen; Nicotine; Skelaxin; and Penicillins  Home Medications   Current Outpatient Rx  Name  Route  Sig  Dispense  Refill  . acetaminophen (TYLENOL) 325 MG tablet   Oral   Take 650 mg by mouth every 4 (four) hours as needed for pain.         Marland Kitchen ALPRAZolam (XANAX) 1 MG tablet   Oral   Take 1 mg by mouth 4 (four) times daily as needed for anxiety. For anxiety.         . buprenorphine (BUTRANS - DOSED MCG/HR) 20 MCG/HR PTWK   Transdermal   Place 1 patch (20 mcg total) onto the skin once a week.   4 patch   0   .  cyclobenzaprine (FLEXERIL) 10 MG tablet   Oral   Take 10 mg by mouth 3 (three) times daily as needed for muscle spasms.         Marland Kitchen escitalopram (LEXAPRO) 20 MG tablet   Oral   Take 1 tablet (20 mg total) by mouth daily.   90 tablet   3     BP 107/67  Pulse 89  Temp(Src) 98.7 F (37.1 C) (Oral)  Resp 12  SpO2 96%  Physical Exam  Nursing note and vitals reviewed. Constitutional: She is oriented to person, place, and time. She appears well-developed and well-nourished.  HENT:  Head: Normocephalic and atraumatic.  Eyes: EOM are normal. Pupils are equal, round, and reactive to light.  Neck: Neck supple. No JVD present.  Cardiovascular: Normal rate, regular rhythm and normal heart sounds.   No murmur heard. Pulmonary/Chest: Effort normal. No respiratory distress.  Abdominal: Soft. She exhibits no distension. There is no tenderness. There is no rebound and no guarding.  Musculoskeletal:  Pt has neck stiffiness, and palpable neck spasms.   Neurological: She is alert and oriented to person, place, and time. She displays normal reflexes. No cranial nerve deficit. Coordination normal.  Skin: Skin is warm and dry.    ED Course  Procedures (including critical care time)  Labs Reviewed  CBC WITH DIFFERENTIAL - Abnormal; Notable for the following:    Neutrophils Relative 42 (*)    Eosinophils Relative 6 (*)    All other components within normal limits  COMPREHENSIVE METABOLIC PANEL  URINALYSIS, MICROSCOPIC ONLY   No results found.   No diagnosis found.    MDM  Pt comes in with cc of neck stiffness, generalized weakness, back pain.  Exam and hx suggestive of spasms of the neck. No neuro deficit. With her patch - we have decided to remove the patch, give her iv meds, including ativan, dilaudid, toradol and phenergan.  Pt will need to see her pain specialist for better control of her sx. We will give her robaxin to go home. She has refused valium by mouth.  2:28  AM Sleeping comfortably, feeling better. Will d.c.  Derwood Kaplan, MD 02/17/13 0228  4:06 AM I discussed with the patient and her husband that we are giving her valium for the muscle spasms. We have reviewed her meds and requested that she doesn't take xanax. She can apply her pain patch as per schedule tomorrow. Husband requested to keep a close eye on the patient for respiratory depression. Pt to see [facility] for optimal management.    Derwood Kaplan, MD 02/17/13 [phone]

## 2013-02-17 NOTE — ED Notes (Signed)
Pt states understanding of discharge instructions 

## 2013-02-19 ENCOUNTER — Telehealth: Payer: Self-pay | Admitting: Internal Medicine

## 2013-02-19 NOTE — Telephone Encounter (Signed)
[facility] Triage Call Report Triage Record Num: [id number] Operator: Gina Allegra Patient Name: Gina [name] Call Date & Time: 02/17/2013 9:55:20PM Patient Phone: [phone] PCP: [name] Patient Gender: Female PCP Fax : [phone] Patient DOB: 10-07-1976 [facility] Name[facility] Reason for Call: Caller: [name]/Patient; PCP: [name] (Adults only); CB#: [phone]; Call regarding Neck pain; which radiated down her back, to her lower back; onset 02/16/13. Pt seen in the ER 02/16/13 pm a was told that she was having muscle spasms. Pt was given IV pain medications. She is currently treating with Flexeril, Tylenol q4 hrs and a pain patch with no improvement. She states that she is having pain but refuses to be seen the ER and would like to know if she can take Dilaudid with her Butran patch. RN verified per drugs.com that there is a moderate interaction between the 2 medications. RN did not advise the medications be taken together. Pt continues to speak over me and will not allow me to speak stating she cannot go back to the ER because she has 2 children and a family. RN continues to advise to ER d/t pts unbearable pain. Unable to triage pt as she will not let me speak. Pt demanded to speak with the MD on call. RN spoke with Dr. Ermalene Searing who agreed with what I've discussed with the pt and also advised the ER. Protocol(s) Used: PCP Calls, No Triage (Adult) Recommended Outcome per Protocol: Call Provider Immediately Reason for Outcome: [1] Caller requests to speak ONLY to PCP AND [2] urgent question Care Advice: ~ 04/

## 2013-02-20 ENCOUNTER — Telehealth: Payer: Self-pay | Admitting: Internal Medicine

## 2013-02-20 ENCOUNTER — Encounter (HOSPITAL_COMMUNITY): Payer: Self-pay | Admitting: *Deleted

## 2013-02-20 ENCOUNTER — Emergency Department (HOSPITAL_COMMUNITY)
Admission: EM | Admit: 2013-02-20 | Discharge: 2013-02-20 | Disposition: A | Discharge status: Home / Self Care | Payer: 59 | Attending: Emergency Medicine | Admitting: Emergency Medicine

## 2013-02-20 ENCOUNTER — Emergency Department (HOSPITAL_COMMUNITY): Payer: 59

## 2013-02-20 DIAGNOSIS — R109 Unspecified abdominal pain: Secondary | ICD-10-CM | POA: Insufficient documentation

## 2013-02-20 DIAGNOSIS — Z8619 Personal history of other infectious and parasitic diseases: Secondary | ICD-10-CM | POA: Insufficient documentation

## 2013-02-20 DIAGNOSIS — Z87440 Personal history of urinary (tract) infections: Secondary | ICD-10-CM | POA: Insufficient documentation

## 2013-02-20 DIAGNOSIS — Z8659 Personal history of other mental and behavioral disorders: Secondary | ICD-10-CM | POA: Insufficient documentation

## 2013-02-20 DIAGNOSIS — Z8709 Personal history of other diseases of the respiratory system: Secondary | ICD-10-CM | POA: Insufficient documentation

## 2013-02-20 DIAGNOSIS — R0789 Other chest pain: Secondary | ICD-10-CM | POA: Insufficient documentation

## 2013-02-20 DIAGNOSIS — F419 Anxiety disorder, unspecified: Secondary | ICD-10-CM

## 2013-02-20 DIAGNOSIS — F411 Generalized anxiety disorder: Secondary | ICD-10-CM | POA: Insufficient documentation

## 2013-02-20 DIAGNOSIS — Z8719 Personal history of other diseases of the digestive system: Secondary | ICD-10-CM | POA: Insufficient documentation

## 2013-02-20 DIAGNOSIS — Z8742 Personal history of other diseases of the female genital tract: Secondary | ICD-10-CM | POA: Insufficient documentation

## 2013-02-20 DIAGNOSIS — R110 Nausea: Secondary | ICD-10-CM | POA: Insufficient documentation

## 2013-02-20 DIAGNOSIS — Z79899 Other long term (current) drug therapy: Secondary | ICD-10-CM | POA: Insufficient documentation

## 2013-02-20 DIAGNOSIS — F1110 Opioid abuse, uncomplicated: Secondary | ICD-10-CM | POA: Insufficient documentation

## 2013-02-20 DIAGNOSIS — R071 Chest pain on breathing: Secondary | ICD-10-CM | POA: Insufficient documentation

## 2013-02-20 DIAGNOSIS — Z8679 Personal history of other diseases of the circulatory system: Secondary | ICD-10-CM | POA: Insufficient documentation

## 2013-02-20 DIAGNOSIS — F17200 Nicotine dependence, unspecified, uncomplicated: Secondary | ICD-10-CM | POA: Insufficient documentation

## 2013-02-20 DIAGNOSIS — Z8739 Personal history of other diseases of the musculoskeletal system and connective tissue: Secondary | ICD-10-CM | POA: Insufficient documentation

## 2013-02-20 LAB — BASIC METABOLIC PANEL
Chloride: 102 mEq/L (ref 96–112)
GFR calc Af Amer: >90 mL/min (ref >90)
GFR calc non Af Amer: >90 mL/min (ref >90)
Glucose, Bld: 96 mg/dL (ref 70–99)
Potassium: 3.8 mEq/L (ref 3.5–5.1)
Sodium: 137 mEq/L (ref 135–145)

## 2013-02-20 LAB — CBC
Hemoglobin: 14.8 g/dL (ref 12.0–15.0)
MCHC: 36.5 g/dL — ABNORMAL HIGH (ref 30.0–36.0)
WBC: 5.8 10*3/uL (ref 4.0–10.5)

## 2013-02-20 LAB — POCT I-STAT TROPONIN I

## 2013-02-20 MED ORDER — SODIUM CHLORIDE 0.9 % IV BOLUS (SEPSIS)
1000.0000 mL | Freq: Once | INTRAVENOUS | Status: AC
  Administered 2013-02-20: 1000 mL via INTRAVENOUS

## 2013-02-20 MED ORDER — PROMETHAZINE HCL 25 MG/ML IJ SOLN
12.5000 mg | Freq: Once | INTRAMUSCULAR | Status: AC
  Administered 2013-02-20: 16:00:00 via INTRAVENOUS
  Filled 2013-02-20: qty 1

## 2013-02-20 MED ORDER — LORAZEPAM 2 MG/ML IJ SOLN
1.0000 mg | Freq: Once | INTRAMUSCULAR | Status: AC
  Administered 2013-02-20: 1 mg via INTRAVENOUS
  Filled 2013-02-20: qty 1

## 2013-02-20 MED ORDER — PROMETHAZINE HCL 25 MG PO TABS
25.0000 mg | ORAL_TABLET | Freq: Once | ORAL | Status: DC
  Filled 2013-02-20: qty 1

## 2013-02-20 MED ORDER — MORPHINE SULFATE 4 MG/ML IJ SOLN
4.0000 mg | Freq: Once | INTRAMUSCULAR | Status: AC
  Administered 2013-02-20: 4 mg via INTRAVENOUS
  Filled 2013-02-20: qty 1

## 2013-02-20 NOTE — Telephone Encounter (Signed)
Left message on machine Can try the flexeril she has or valium as muscle relaxer- if she can tolerate ibuprofen,can try that-call if questions

## 2013-02-20 NOTE — ED Provider Notes (Signed)
I saw and evaluated the patient, reviewed the resident's note and I agree with the findings and plan.  Pt with chest wall pain, no concern for acs, suspect pt with fibromyalgia--stable for d/c after iv fluids  Toy Baker, MD 02/20/13 1531

## 2013-02-20 NOTE — ED Notes (Signed)
Pt states hit in head with football three weeks ago and was seen at an urgent care.  Pt was on Friday for back and leg spasms.  Pt states chest pain started last nite and involves left shoulder.  Pt has been vomiting.  Pt states feels like an effort to take a deep breath

## 2013-02-20 NOTE — ED Provider Notes (Signed)
History     CSN: [id number]  Arrival date & time 02/20/13  1323   First MD Initiated Contact with Patient 02/20/13 1359      Chief Complaint  Patient presents with  . Chest Pain  . Emesis    (Consider location/radiation/quality/duration/timing/severity/associated sxs/prior treatment) Patient is a 37 y.o. female presenting with chest pain. The history is provided by the patient, the spouse and medical records.  Chest Pain Pain location:  Substernal area Pain quality: aching, burning, crushing, dull, hot, pressure, sharp, stabbing, throbbing and tightness   Pain radiates to:  Does not radiate Pain radiates to the back: no   Pain severity:  Mild Onset quality:  Gradual Duration:  15 hours Timing:  Constant Progression:  Unchanged Chronicity:  Recurrent Context: not breathing, not eating, not lifting, not raising an arm and not at rest   Relieved by:  Nothing Worsened by:  Nothing tried Ineffective treatments:  None tried Associated symptoms: abdominal pain (chronic; unchanged from baseline) and nausea   Associated symptoms: no cough, no fever, no palpitations and no shortness of breath     Past Medical History  Diagnosis Date  . ABDOMINAL WALL HERNIA 02/27/2010  . ALLERGIC RHINITIS 06/02/2007  . ANOREXIA, CHRONIC 08/07/2008  . ANXIETY 09/15/2010  . BACK PAIN, THORACIC REGION 07/07/2007  . CERVICAL RADICULOPATHY 12/11/2008  . Condyloma acuminatum 04/23/2009  . CONSTIPATION 09/15/2010  . DYSPHAGIA UNSPECIFIED 09/09/2009  . 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 ENDOMETRIOSIS 12/16/2009  . FATIGUE 06/11/2010  . GERD 10/23/2009  . HERPES SIMPLEX INFECTION 06/11/2010  . LENTIGO 04/23/2009  . LUNG NODULE 06/17/2010  . Palpitations 10/23/2009  . Stricture and stenosis of esophagus 05/13/2010  . TOBACCO ABUSE 08/07/2008  . Heart palpitations   . Urinary tract infection   . Rheumatoid arthritis   . Ovarian cyst   . Abnormal Pap smear   . Fibromyalgia   . Narcotic abuse, continuous   .  Panic disorder     Past Surgical History  Procedure Laterality Date  . Partial hysterectomy    . Endometrial ablation    . Hernia repair      X3  . Dilation and curettage of uterus      x1   . Cesarean section      x2   . Esophageal dilatation x 4    . Gum surgery    . Abdominal surgery    . Abdominal wall mesh  removal      Family History  Problem Relation Age of Onset  . Depression Mother   . Arthritis Mother   . Hyperlipidemia Father   . Hypertension Father     History  Substance Use Topics  . Smoking status: Current Every Day Smoker -- 1.00 packs/day    Types: Cigarettes  . Smokeless tobacco: Never Used  . Alcohol Use: No    OB History   Grav Para Term Preterm Abortions TAB SAB Ect Mult Living   3 2 2  0 1 0 1 0 0 2      Review of Systems  Constitutional: Negative for fever, chills, activity change and appetite change.  HENT: Negative for neck pain and neck stiffness.   Respiratory: Positive for chest tightness. Negative for cough, shortness of breath and wheezing.   Cardiovascular: Positive for chest pain. Negative for palpitations.  Gastrointestinal: Positive for nausea and abdominal pain (chronic; unchanged from baseline). Negative for diarrhea and constipation.  Genitourinary: Negative for dysuria and decreased urine volume.  Skin: Negative for rash  and wound.  Neurological: Negative for syncope and light-headedness.  Psychiatric/Behavioral: Negative for suicidal ideas, confusion, sleep disturbance, self-injury, decreased concentration and agitation. The patient is nervous/anxious.   All other systems reviewed and are negative.    Allergies  Lamictal; Zofran; Chantix; Ibuprofen; Nicotine; Skelaxin; and Penicillins  Home Medications   Current Outpatient Rx  Name  Route  Sig  Dispense  Refill  . acetaminophen (TYLENOL) 325 MG tablet   Oral   Take 650 mg by mouth every 4 (four) hours as needed for pain.         Marland Kitchen ALPRAZolam (XANAX) 1 MG tablet    Oral   Take 1 mg by mouth 4 (four) times daily as needed for anxiety. For anxiety.         . buprenorphine (BUTRANS - DOSED MCG/HR) 20 MCG/HR PTWK   Transdermal   Place 1 patch (20 mcg total) onto the skin once a week.   4 patch   0   . cyclobenzaprine (FLEXERIL) 10 MG tablet   Oral   Take 10 mg by mouth 3 (three) times daily as needed for muscle spasms.         . diazepam (VALIUM) 5 MG tablet   Oral   Take 1 tablet (5 mg total) by mouth 2 (two) times daily.   10 tablet   0   . escitalopram (LEXAPRO) 20 MG tablet   Oral   Take 1 tablet (20 mg total) by mouth daily.   90 tablet   3     BP 95/66  Pulse 88  Temp(Src) 98.1 F (36.7 C) (Oral)  Resp 14  SpO2 100%  Physical Exam  Nursing note and vitals reviewed. Constitutional: She is oriented to person, place, and time. She appears well-developed and well-nourished.  HENT:  Head: Normocephalic and atraumatic.  Right Ear: External ear normal.  Left Ear: External ear normal.  Nose: Nose normal.  Mouth/Throat: Oropharynx is clear and moist. No oropharyngeal exudate.  Eyes: Conjunctivae are normal. Pupils are equal, round, and reactive to light.  Neck: Normal range of motion. Neck supple.  Cardiovascular: Normal rate, regular rhythm, normal heart sounds and intact distal pulses.  Exam reveals no gallop and no friction rub.   No murmur heard. Pulmonary/Chest: Effort normal and breath sounds normal. No respiratory distress. She has no wheezes. She has no rales. She exhibits tenderness (hypersensate overlying sternum).  Abdominal: Soft. Bowel sounds are normal. She exhibits no distension and no mass. There is no tenderness. There is no rebound and no guarding.  Musculoskeletal: Normal range of motion. She exhibits no edema and no tenderness.  Neurological: She is alert and oriented to person, place, and time.  Skin: Skin is warm and dry.  Psychiatric: Her behavior is normal. Judgment and thought content normal.  Anxious  mood and affect    ED Course  Procedures (including critical care time)  Labs Reviewed  CBC - Abnormal; Notable for the following:    MCHC 36.5 (*)    All other components within normal limits  BASIC METABOLIC PANEL - Abnormal; Notable for the following:    Creatinine, Ser 0.47 (*)    All other components within normal limits  POCT I-STAT TROPONIN I   Dg Chest 2 View  02/20/2013  *RADIOLOGY REPORT*  Clinical Data: Chest pain and shortness of breath  CHEST - 2 VIEW  Comparison: 09/15/2012  Findings: The cardiomediastinal silhouette is unremarkable. There is no evidence of focal airspace disease, pulmonary edema, suspicious  pulmonary nodule/mass, pleural effusion, or pneumothorax. No acute bony abnormalities are identified.  IMPRESSION: No evidence of active cardiopulmonary disease.   Original Report Authenticated By: Harmon Pier, M.D.      Date: 02/20/2013  Rate: 75 bpm  Rhythm: normal sinus rhythm  QRS Axis: normal  Intervals: normal  ST/T Wave abnormalities: normal  Conduction Disutrbances:none  Narrative Interpretation: Normal sinus rhythm without evidence of acute ischemia  Old EKG Reviewed: unchanged   1. Anxiety   2. Chest wall pain       MDM  37 yo F presents for constant substernal chest pain of 15 hrs. Pain began at rest. AFVSS. Patient hypersensate over sternum. No rashes. Patient with multiple complaints, that change throughout the history. States she has not filled recently prescribed Valium that she was given at last ED visit 3 days ago; seen at that time for back pain. Clinical picture not concerning for ACS, aortic dissection, pulmonary embolism, pneumonia, or pneumothorax. Patient's husband is concerned that she is withdrawing from narcotics because she ran out 3 weeks ago and has not had a recently filled prescription. No clinical evidence of withdrawal. Pt repeatedly requests IV fluids although she has tolerated oral intake at home and is not clinically  dehydrated. 1L IVF administered and pain, nausea, and anxiety treated symptomatically in ED. Patient given return precautions, including worsening of signs or symptoms. Patient instructed to follow-up with primary care physician.         Clemetine Marker, MD 02/20/13 1540

## 2013-02-20 NOTE — Telephone Encounter (Signed)
PT called and stated that she "feels like she's been in a car wreck". She described fatigue, not being able to sleep, muscle spasms, pain, congestion, and other symptoms as the reason she's calling. She stated that Dr. Lovell Sheehan understands her history and would like to speak with you.

## 2013-02-21 ENCOUNTER — Ambulatory Visit: Payer: 59 | Admitting: Family Medicine

## 2013-02-22 ENCOUNTER — Telehealth: Payer: Self-pay | Admitting: Internal Medicine

## 2013-02-22 NOTE — ED Provider Notes (Signed)
I saw and evaluated the patient, reviewed the resident's note and I agree with the findings and plan.  Toy Baker, MD 02/22/13 [phone]

## 2013-02-22 NOTE — Telephone Encounter (Signed)
Patient Information:  Caller Name: Michaelah  Phone: [phone]  Patient: [place], Kestner  Gender: Female  DOB: [birth year]  Age: 37 Years  PCP: [name] (Adults only)  Pregnant: No  Office Follow Up:  Does the office need to follow up with this patient?: No  Instructions For The Office: N/A  RN Note:  Patient calling emergent line regarding ongoing back and neck pain.  Has been taking flexeril, valium, tylenol as directed, but has had no improvement in symptoms.  Patient is in tears; refuses new triage, refuses ED, refuses appt with another provider.  States rheumatologist office she had been referred to tells her they are not taking new patients.  Patient states she is frustrated "and does not know what to do."  Advised ED due to excruciating pain and inability to walk.  krs/can  Symptoms  Reason For Call & Symptoms: back pain  Reviewed Health History In EMR: No  Reviewed Medications In EMR: No  Reviewed Allergies In EMR: No  Reviewed Surgeries / Procedures: No  Date of Onset of Symptoms: 02/20/2013 OB / GYN:  LMP: Unknown  Guideline(s) Used:  No Protocol Available - Sick Adult  Disposition Per Guideline:   Go to ED Now  Reason For Disposition Reached:   Nursing judgment  Advice Given:  N/A  Patient Will Follow Care Advice:  YES

## 2013-02-23 ENCOUNTER — Emergency Department (HOSPITAL_COMMUNITY): Payer: 59

## 2013-02-23 ENCOUNTER — Telehealth: Payer: Self-pay | Admitting: Internal Medicine

## 2013-02-23 ENCOUNTER — Emergency Department (HOSPITAL_COMMUNITY)
Admission: EM | Admit: 2013-02-23 | Discharge: 2013-02-23 | Disposition: A | Discharge status: Home / Self Care | Payer: 59 | Attending: Emergency Medicine | Admitting: Emergency Medicine

## 2013-02-23 ENCOUNTER — Encounter (HOSPITAL_COMMUNITY): Payer: Self-pay | Admitting: *Deleted

## 2013-02-23 DIAGNOSIS — M549 Dorsalgia, unspecified: Secondary | ICD-10-CM | POA: Insufficient documentation

## 2013-02-23 DIAGNOSIS — Z8679 Personal history of other diseases of the circulatory system: Secondary | ICD-10-CM | POA: Insufficient documentation

## 2013-02-23 DIAGNOSIS — R110 Nausea: Secondary | ICD-10-CM | POA: Insufficient documentation

## 2013-02-23 DIAGNOSIS — Z8719 Personal history of other diseases of the digestive system: Secondary | ICD-10-CM | POA: Insufficient documentation

## 2013-02-23 DIAGNOSIS — Z79899 Other long term (current) drug therapy: Secondary | ICD-10-CM | POA: Insufficient documentation

## 2013-02-23 DIAGNOSIS — Z8619 Personal history of other infectious and parasitic diseases: Secondary | ICD-10-CM | POA: Insufficient documentation

## 2013-02-23 DIAGNOSIS — Z8709 Personal history of other diseases of the respiratory system: Secondary | ICD-10-CM | POA: Insufficient documentation

## 2013-02-23 DIAGNOSIS — Z8742 Personal history of other diseases of the female genital tract: Secondary | ICD-10-CM | POA: Insufficient documentation

## 2013-02-23 DIAGNOSIS — IMO0001 Reserved for inherently not codable concepts without codable children: Secondary | ICD-10-CM | POA: Insufficient documentation

## 2013-02-23 DIAGNOSIS — Z872 Personal history of diseases of the skin and subcutaneous tissue: Secondary | ICD-10-CM | POA: Insufficient documentation

## 2013-02-23 DIAGNOSIS — Z87440 Personal history of urinary (tract) infections: Secondary | ICD-10-CM | POA: Insufficient documentation

## 2013-02-23 DIAGNOSIS — Z8739 Personal history of other diseases of the musculoskeletal system and connective tissue: Secondary | ICD-10-CM | POA: Insufficient documentation

## 2013-02-23 DIAGNOSIS — Z880 Allergy status to penicillin: Secondary | ICD-10-CM | POA: Insufficient documentation

## 2013-02-23 DIAGNOSIS — M6281 Muscle weakness (generalized): Secondary | ICD-10-CM | POA: Insufficient documentation

## 2013-02-23 DIAGNOSIS — F17200 Nicotine dependence, unspecified, uncomplicated: Secondary | ICD-10-CM | POA: Insufficient documentation

## 2013-02-23 DIAGNOSIS — K219 Gastro-esophageal reflux disease without esophagitis: Secondary | ICD-10-CM | POA: Insufficient documentation

## 2013-02-23 DIAGNOSIS — F341 Dysthymic disorder: Secondary | ICD-10-CM | POA: Insufficient documentation

## 2013-02-23 LAB — CBC WITH DIFFERENTIAL/PLATELET
Basophils Absolute: 0 10*3/uL (ref 0.0–0.1)
Basophils Relative: 0 % (ref 0–1)
Eosinophils Absolute: 0.3 10*3/uL (ref 0.0–0.7)
Lymphs Abs: 2.3 10*3/uL (ref 0.7–4.0)
MCH: 32 pg (ref 26.0–34.0)
Neutrophils Relative %: 57 % (ref 43–77)
Platelets: 192 10*3/uL (ref 150–400)
RBC: 4.82 MIL/uL (ref 3.87–5.11)
WBC: 7.3 10*3/uL (ref 4.0–10.5)

## 2013-02-23 LAB — COMPREHENSIVE METABOLIC PANEL
ALT: 18 U/L (ref 0–35)
AST: 21 U/L (ref 0–37)
Albumin: 3.9 g/dL (ref 3.5–5.2)
Alkaline Phosphatase: 82 U/L (ref 39–117)
Glucose, Bld: 86 mg/dL (ref 70–99)
Potassium: 4.1 mEq/L (ref 3.5–5.1)
Sodium: 140 mEq/L (ref 135–145)
Total Protein: 6.8 g/dL (ref 6.0–8.3)

## 2013-02-23 LAB — URINALYSIS, ROUTINE W REFLEX MICROSCOPIC
Bilirubin Urine: NEGATIVE
Nitrite: NEGATIVE
Specific Gravity, Urine: 1.009 (ref 1.005–1.030)
Urobilinogen, UA: 0.2 mg/dL (ref 0.0–1.0)

## 2013-02-23 MED ORDER — HYDROMORPHONE HCL PF 1 MG/ML IJ SOLN
1.0000 mg | Freq: Once | INTRAMUSCULAR | Status: AC
  Administered 2013-02-23: 1 mg via INTRAVENOUS
  Filled 2013-02-23: qty 1

## 2013-02-23 MED ORDER — METHOCARBAMOL 500 MG PO TABS: 500.0000 mg | ORAL_TABLET | Freq: Two times a day (BID) | ORAL | Status: DC

## 2013-02-23 MED ORDER — GADOBENATE DIMEGLUMINE 529 MG/ML IV SOLN
15.0000 mL | Freq: Once | INTRAVENOUS | Status: AC
  Administered 2013-02-23: 15 mL via INTRAVENOUS

## 2013-02-23 MED ORDER — GADOBENATE DIMEGLUMINE 529 MG/ML IV SOLN
15.0000 mL | Freq: Once | INTRAVENOUS | Status: AC | PRN
  Administered 2013-02-23: 15 mL via INTRAVENOUS

## 2013-02-23 MED ORDER — PROCHLORPERAZINE EDISYLATE 5 MG/ML IJ SOLN
10.0000 mg | Freq: Once | INTRAMUSCULAR | Status: AC
  Administered 2013-02-23: 10 mg via INTRAVENOUS
  Filled 2013-02-23: qty 2

## 2013-02-23 MED ORDER — KETOROLAC TROMETHAMINE 30 MG/ML IJ SOLN
30.0000 mg | Freq: Once | INTRAMUSCULAR | Status: AC
  Administered 2013-02-23: 30 mg via INTRAVENOUS
  Filled 2013-02-23: qty 1

## 2013-02-23 MED ORDER — LORAZEPAM 2 MG/ML IJ SOLN
2.0000 mg | Freq: Once | INTRAMUSCULAR | Status: AC
  Administered 2013-02-23: 2 mg via INTRAVENOUS
  Filled 2013-02-23: qty 1

## 2013-02-23 MED ORDER — LORAZEPAM 2 MG/ML IJ SOLN
1.0000 mg | Freq: Once | INTRAMUSCULAR | Status: AC
  Administered 2013-02-23: 1 mg via INTRAVENOUS
  Filled 2013-02-23: qty 1

## 2013-02-23 NOTE — Telephone Encounter (Signed)
Patient Information:  Caller Name: Gina [name]  Phone: [phone]  Patient: Gina [name], Gina [name]  Gender: Female  DOB: December 31, 1975  Age: 37 Years  PCP: [name] (Adults only)  Pregnant: No  Office Follow Up:  Does the office need to follow up with this patient?: No  Instructions For The Office: N/A  RN Note:  Pt states that hse has been to ED twice anad they are not helping her; pt will comply and call 911 now and goto [facility] ED; pt is by herself; instructed not to get up by herself; wait for 911 to prevent falls  Symptoms  Reason For Call & Symptoms: Pt is on the phone and states that she can not walk; sx started 02/16/13; having sharp shooting pain in neck and radiate to legs and arms; left side is weaker than right side;  generalized weakness noted; fell 6 times on 02/22/13; was not seen yesterday; was instructed to go to ED; pt did not go to ED on 02/23/12;  seen in ED x 2 over the last week on 02/16/13 and 02/20/13 and dx with muscle spasms and panic attack;  could not walk this morning; took pt an hour to get to the bathroom this morning  Reviewed Health History In EMR: Yes  Reviewed Medications In EMR: Yes  Reviewed Allergies In EMR: Yes  Reviewed Surgeries / Procedures: Yes  Date of Onset of Symptoms: 02/16/2013 OB / GYN:  LMP: Unknown  Guideline(s) Used:  Weakness (Generalized) and Fatigue  Disposition Per Guideline:   Call EMS 911 Now  Reason For Disposition Reached:   Severe weakness (i.e., unable to walk or barely able to walk, requires support) and new onset or worsening  Advice Given:  N/A  Patient Will Follow Care Advice:  YES

## 2013-02-23 NOTE — ED Provider Notes (Signed)
Medical screening examination/treatment/procedure(s) were performed by non-physician practitioner and as supervising physician I was immediately available for consultation/collaboration.    Vida Roller, MD 02/23/13 2136

## 2013-02-23 NOTE — ED Provider Notes (Signed)
4:17 PM Assumed care of patient from Arthor Captain, PA-C.  Patient presents today with a chief complaint of back pain and weakness of the legs.  MRI lumbar spine and cervical spine pending.  Plan is for the patient to be discharged home if MRI is negative.  7:15 PM Discussed the results of the MRI with the patient and her parents.  No acute changes on MRI.  Will discharge the patient home.  She reports that her pain has improved at this time.    Pascal Lux Landfall, PA-C 02/24/13 1208

## 2013-02-23 NOTE — ED Provider Notes (Signed)
History     CSN: [id number]  Arrival date & time 02/23/13  1147   First MD Initiated Contact with Patient 02/23/13 1150      Chief Complaint  Patient presents with  . Back Pain    (Consider location/radiation/quality/duration/timing/severity/associated sxs/prior treatment) HPI   37 year old female presents to the emergency department chief complaint of severe back pain, bilateral lower extremity weakness.  She has a past medical history of rheumatoid arthritis, endometriosis, anxiety, fibromyalgia panic disorder and narcotic abuse. Patient has been seen twice here in the ED.  She has a long history of chronic back pain and intermittent weakness of the legs.  States that this is her worst episode ever.  She states it usually will go away after a couple of days.  The patient has been worked up by neurology and rheumatology previously.  She states that she has been trying to get in contact with her primary care physician who is now only working part time.  Today he sent her to the emergency department to be admitted for her back pain.  The patient states that she is unable to make it from her bed to the bathroom due to weakness in her leg.  Her husband states that he has to practically carry her around the house.  Patient is using a pain patch (buprenorphone) she states this is not touching her pain.  She was recently taken off of her Percocet which she has been on long-term.  Patient also complains of nausea. The patient says she has chronic weakness in the left leg.  Past medical history shows multiple MRIs, multiple CT scans, CT imaging of the brain all of which have been negative for abnormality.  The patient is emotionally labile and demands to know what is wrong with her today.  She states she cannot live like this anymore.  Her husband appears exhausted and irritated as well. Denies loss of bowel/bladder function or saddle anesthesia. Denies neck stiffness, headache, rash.  Denies fever or  recent procedures to back.   Past Medical History  Diagnosis Date  . ABDOMINAL WALL HERNIA 02/27/2010  . ALLERGIC RHINITIS 06/02/2007  . ANOREXIA, CHRONIC 08/07/2008  . ANXIETY 09/15/2010  . BACK PAIN, THORACIC REGION 07/07/2007  . CERVICAL RADICULOPATHY 12/11/2008  . Condyloma acuminatum 04/23/2009  . CONSTIPATION 09/15/2010  . DYSPHAGIA UNSPECIFIED 09/09/2009  . 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 ENDOMETRIOSIS 12/16/2009  . FATIGUE 06/11/2010  . GERD 10/23/2009  . HERPES SIMPLEX INFECTION 06/11/2010  . LENTIGO 04/23/2009  . LUNG NODULE 06/17/2010  . Palpitations 10/23/2009  . Stricture and stenosis of esophagus 05/13/2010  . TOBACCO ABUSE 08/07/2008  . Heart palpitations   . Urinary tract infection   . Rheumatoid arthritis   . Ovarian cyst   . Abnormal Pap smear   . Fibromyalgia   . Narcotic abuse, continuous   . Panic disorder     Past Surgical History  Procedure Laterality Date  . Partial hysterectomy    . Endometrial ablation    . Hernia repair      X3  . Dilation and curettage of uterus      x1   . Cesarean section      x2   . Esophageal dilatation x 4    . Gum surgery    . Abdominal surgery    . Abdominal wall mesh  removal      Family History  Problem Relation Age of Onset  . Depression Mother   . Arthritis Mother   .  Hyperlipidemia Father   . Hypertension Father     History  Substance Use Topics  . Smoking status: Current Every Day Smoker -- 1.00 packs/day    Types: Cigarettes  . Smokeless tobacco: Never Used  . Alcohol Use: No    OB History   Grav Para Term Preterm Abortions TAB SAB Ect Mult Living   3 2 2  0 1 0 1 0 0 2      Review of Systems  Ten systems reviewed and are negative for acute change, except as noted in the HPI.   Allergies  Lamictal; Nicotine; Zofran; Chantix; Ibuprofen; Morphine and related; Skelaxin; and Penicillins  Home Medications   Current Outpatient Rx  Name  Route  Sig  Dispense  Refill  . acetaminophen (TYLENOL) 325 MG  tablet   Oral   Take 650 mg by mouth every 4 (four) hours as needed for pain.         Marland Kitchen ALPRAZolam (XANAX) 1 MG tablet   Oral   Take 1 mg by mouth 4 (four) times daily as needed for anxiety. For anxiety.         . buprenorphine (BUTRANS - DOSED MCG/HR) 20 MCG/HR PTWK   Transdermal   Place 1 patch (20 mcg total) onto the skin once a week.   4 patch   0   . cyclobenzaprine (FLEXERIL) 10 MG tablet   Oral   Take 10 mg by mouth 3 (three) times daily as needed for muscle spasms.         . diazepam (VALIUM) 5 MG tablet   Oral   Take 1 tablet (5 mg total) by mouth 2 (two) times daily.   10 tablet   0   . escitalopram (LEXAPRO) 20 MG tablet   Oral   Take 1 tablet (20 mg total) by mouth daily.   90 tablet   3     BP 101/73  Pulse 97  Temp(Src) 99.1 F (37.3 C) (Oral)  Resp 22  SpO2 99%  Physical Exam  Nursing note and vitals reviewed. Constitutional: She is oriented to person, place, and time. She appears well-developed and well-nourished. No distress.  Histrionic, emotionally labile.   HENT:  Head: Normocephalic and atraumatic.  Eyes: Conjunctivae are normal. No scleral icterus.  Neck: Normal range of motion. Neck supple. No tracheal deviation present.  Cardiovascular: Normal rate, regular rhythm and normal heart sounds.  Exam reveals no gallop and no friction rub.   No murmur heard. Pulmonary/Chest: Effort normal and breath sounds normal. No respiratory distress.  Abdominal: Soft. Bowel sounds are normal. She exhibits no distension and no mass. There is no tenderness. There is no guarding.  Lymphadenopathy:    She has no cervical adenopathy.  Neurological: She is alert and oriented to person, place, and time. She displays normal reflexes.  Patient is very unwilliing to participate in exam due to pain. She has 5/5 strength in the right leg. 4/5 strength on the right with normal DTRs BL.  Patient refuses to ambulate. Normal RAM, F-N. A&O x4. Normal sensation to light  touch. Speech is clear and goal oriented.  Skin: Skin is warm and dry. She is not diaphoretic.    ED Course  Procedures (including critical care time)  Labs Reviewed - No data to display No results found.    No diagnosis found.    MDM  3:55 PM . Patient here for the 3rd time this week. She is demanding to know what is wrong. I  will order  MRI lumbar/cervical .  Patient states that she has claustrophobia and will need something to prevent anxiety.  Patient case discussed with PA Anne Shutter who will assume care of the patient.      Arthor Captain, PA-C 02/23/13 1613  Arthor Captain, PA-C 02/23/13 2100

## 2013-02-23 NOTE — ED Notes (Signed)
Patient states she has ongoing pain issues and is unable to see PCP, patient states now she has increased pain in back and legs are weak and will give away on her

## 2013-02-24 NOTE — ED Provider Notes (Signed)
Medical screening examination/treatment/procedure(s) were performed by non-physician practitioner and as supervising physician I was immediately available for consultation/collaboration.  [name], MD 02/24/13 1212

## 2013-02-28 ENCOUNTER — Telehealth: Payer: Self-pay | Admitting: Internal Medicine

## 2013-02-28 NOTE — Telephone Encounter (Signed)
PT states that when she was in the ED, they took her off of the Flexeril and put her on Rovaxin 1 po BID, 500mg . They also prescribed her Diazepam 5 mg 1po, BID. She would like Dr. Lovell Sheehan to call in both of these RX's. Please assist.

## 2013-03-01 ENCOUNTER — Other Ambulatory Visit: Payer: Self-pay | Admitting: *Deleted

## 2013-03-01 MED ORDER — METHOCARBAMOL 500 MG PO TABS: 500.0000 mg | ORAL_TABLET | Freq: Two times a day (BID) | ORAL | Status: DC

## 2013-03-01 NOTE — Telephone Encounter (Signed)
Talked with pt and she wants robaxin but, the valium makes her sleep well, and the xanax make her have less aches pain,therefore she wants both.   Also wants to know about dr Ancil Linsey in [place]  Also wants to know what dr Lovell Sheehan plans are for the butran

## 2013-03-02 ENCOUNTER — Other Ambulatory Visit: Payer: Self-pay | Admitting: *Deleted

## 2013-03-02 DIAGNOSIS — M791 Myalgia, unspecified site: Secondary | ICD-10-CM

## 2013-03-02 NOTE — Telephone Encounter (Signed)
Talked with pt an d appointment made for [id number] to discuss taking valium and xanax and each of them work in a different way and she needs both of them, also referral was made to dr Ignacia Bayley bravo

## 2013-03-12 ENCOUNTER — Telehealth: Payer: Self-pay | Admitting: Internal Medicine

## 2013-03-12 MED ORDER — BUPRENORPHINE 20 MCG/HR TD PTWK: 20.0000 ug | MEDICATED_PATCH | TRANSDERMAL | Status: DC

## 2013-03-12 NOTE — Telephone Encounter (Signed)
Per dr Lovell Sheehan stay on butrans patches.scripot for next month up front-per dr Lovell Sheehan- may take celebrex to help with pain. Pt refused celebrex,stating she had tried in past and didn't help--will just stay on butrans patches until ov 6-4

## 2013-03-12 NOTE — Telephone Encounter (Signed)
Pt is on buprenorphine for pain. Pt stated patch is breaking her out. Pt would like to tamper off patch. Please advise. Pt would like something else for pain.

## 2013-03-12 NOTE — Telephone Encounter (Signed)
Want to titrate off patch, but wants something for pain while she is titrating off

## 2013-03-21 ENCOUNTER — Telehealth: Payer: Self-pay | Admitting: Internal Medicine

## 2013-03-21 DIAGNOSIS — IMO0001 Reserved for inherently not codable concepts without codable children: Secondary | ICD-10-CM

## 2013-03-21 NOTE — Telephone Encounter (Signed)
Nothing I can do about her being late.  Will send referral to dr truslow

## 2013-03-21 NOTE — Telephone Encounter (Signed)
Patient called and stated that she had an appointment with Dr. Ancil Linsey at 3:00pm. She stated that she got lost, and they would not see her due to her being 6 minutes late. She states that she would like a referral to another rheumatologist in the [place] area. Pt stated that she would like to hear from [place]. Please advise.

## 2013-04-04 ENCOUNTER — Ambulatory Visit (INDEPENDENT_AMBULATORY_CARE_PROVIDER_SITE_OTHER): Payer: 59 | Admitting: Internal Medicine

## 2013-04-04 ENCOUNTER — Encounter: Payer: Self-pay | Admitting: Internal Medicine

## 2013-04-04 VITALS — BP 106/70 | HR 72 | Temp 98.0°F | Resp 16 | Ht 67.0 in | Wt 150.0 lb

## 2013-04-04 DIAGNOSIS — M797 Fibromyalgia: Secondary | ICD-10-CM

## 2013-04-04 DIAGNOSIS — F410 Panic disorder [episodic paroxysmal anxiety] without agoraphobia: Secondary | ICD-10-CM

## 2013-04-04 DIAGNOSIS — F458 Other somatoform disorders: Secondary | ICD-10-CM

## 2013-04-04 DIAGNOSIS — IMO0001 Reserved for inherently not codable concepts without codable children: Secondary | ICD-10-CM

## 2013-04-04 MED ORDER — METHOCARBAMOL 500 MG PO TABS: 500.0000 mg | ORAL_TABLET | Freq: Three times a day (TID) | ORAL | Status: DC

## 2013-04-04 NOTE — Progress Notes (Signed)
Subjective:    Patient ID: Gina [name], female    DOB: 14-May-1976, 37 y.o.   MRN: [id number]  HPI The patient had mess removal at [facility] and [facility]. Stomach pain has been controllable. She has been follow ed by chronic pain at [facility] and has been on butrans patches. She notes periodic fares of wall pain She notes multiple visits to ER for pain medications She is on robaxin for "chronic pain" The valium worked but she had "panic attacks" The xanax controlled the panic.     Review of Systems  Constitutional: Positive for fatigue. Negative for activity change and appetite change.  HENT: Positive for neck stiffness. Negative for ear pain, congestion, neck pain, postnasal drip and sinus pressure.   Eyes: Negative for redness and visual disturbance.  Respiratory: Negative for cough, shortness of breath and wheezing.   Gastrointestinal: Negative for abdominal pain and abdominal distention.  Genitourinary: Negative for dysuria, frequency and menstrual problem.  Musculoskeletal: Positive for myalgias, back pain, joint swelling and gait problem. Negative for arthralgias.  Skin: Negative for rash and wound.  Neurological: Positive for weakness. Negative for dizziness and headaches.  Hematological: Negative for adenopathy. Does not bruise/bleed easily.  Psychiatric/Behavioral: Negative for sleep disturbance and decreased concentration.       Past Medical History  Diagnosis Date  . ABDOMINAL WALL HERNIA 02/27/2010  . ALLERGIC RHINITIS 06/02/2007  . ANOREXIA, CHRONIC 08/07/2008  . ANXIETY 09/15/2010  . BACK PAIN, THORACIC REGION 07/07/2007  . CERVICAL RADICULOPATHY 12/11/2008  . Condyloma acuminatum 04/23/2009  . CONSTIPATION 09/15/2010  . DYSPHAGIA UNSPECIFIED 09/09/2009  . 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 ENDOMETRIOSIS 12/16/2009  . FATIGUE 06/11/2010  . GERD 10/23/2009  . HERPES SIMPLEX INFECTION 06/11/2010  . LENTIGO 04/23/2009  . LUNG NODULE 06/17/2010  . Palpitations 10/23/2009   . Stricture and stenosis of esophagus 05/13/2010  . TOBACCO ABUSE 08/07/2008  . Heart palpitations   . Urinary tract infection   . Rheumatoid arthritis(714.0)   . Ovarian cyst   . Abnormal Pap smear   . Fibromyalgia   . Narcotic abuse, continuous   . Panic disorder     History   Social History  . Marital Status: Married    Spouse Name: N/A    Number of Children: N/A  . Years of Education: N/A   Occupational History  . Not on file.   Social History Main Topics  . Smoking status: Current Every Day Smoker -- 1.00 packs/day    Types: Cigarettes  . Smokeless tobacco: Never Used  . Alcohol Use: No  . Drug Use: No  . Sexually Active: Not on file   Other Topics Concern  . Not on file   Social History Narrative  . No narrative on file    Past Surgical History  Procedure Laterality Date  . Partial hysterectomy    . Endometrial ablation    . Hernia repair      X3  . Dilation and curettage of uterus      x1   . Cesarean section      x2   . Esophageal dilatation x 4    . Gum surgery    . Abdominal surgery    . Abdominal wall mesh  removal      Family History  Problem Relation Age of Onset  . Depression Mother   . Arthritis Mother   . Hyperlipidemia Father   . Hypertension Father     Allergies  Allergen Reactions  . Lamictal (Lamotrigine)  Anaphylaxis  . Nicotine Swelling and Palpitations    Patches caused palpations lozenges throat swelled  . Zofran Anaphylaxis  . Chantix (Varenicline Tartrate) Other (See Comments)    "went crazy"  . Ibuprofen Nausea And Vomiting  . Morphine And Related Other (See Comments)    Makes pt "very mean"  . Skelaxin Other (See Comments)    rash  . Penicillins Rash    Current Outpatient Prescriptions on File Prior to Visit  Medication Sig Dispense Refill  . acetaminophen (TYLENOL) 325 MG tablet Take 650 mg by mouth every 4 (four) hours as needed for pain.      Marland Kitchen ALPRAZolam (XANAX) 1 MG tablet Take 1 mg by mouth 4 (four)  times daily as needed for anxiety. For anxiety.      . buprenorphine (BUTRANS - DOSED MCG/HR) 20 MCG/HR PTWK Place 1 patch (20 mcg total) onto the skin once a week.  4 patch  0  . escitalopram (LEXAPRO) 20 MG tablet Take 1 tablet (20 mg total) by mouth daily.  90 tablet  3  . methocarbamol (ROBAXIN) 500 MG tablet Take 1 tablet (500 mg total) by mouth 2 (two) times daily.  60 tablet  1   No current facility-administered medications on file prior to visit.    BP 106/70  Pulse 72  Temp(Src) 98 F (36.7 C)  Resp 16  Ht 5\' 7"  (1.702 m)  Wt 150 lb (68.04 kg)  BMI 23.49 kg/m2    Objective:   Physical Exam  Nursing note and vitals reviewed. Constitutional: She is oriented to person, place, and time. She appears well-developed and well-nourished. No distress.  HENT:  Head: Normocephalic and atraumatic.  Right Ear: External ear normal.  Left Ear: External ear normal.  Nose: Nose normal.  Mouth/Throat: Oropharynx is clear and moist.  Eyes: Conjunctivae and EOM are normal. Pupils are equal, round, and reactive to light.  Neck: Normal range of motion. Neck supple. No JVD present. No tracheal deviation present. No thyromegaly present.  Cardiovascular: Normal rate, regular rhythm, normal heart sounds and intact distal pulses.   No murmur heard. Pulmonary/Chest: Effort normal and breath sounds normal. She has no wheezes. She exhibits no tenderness.  Abdominal: Soft. Bowel sounds are normal. She exhibits no mass. There is tenderness. There is no rebound and no guarding.  Musculoskeletal: Normal range of motion. She exhibits no edema and no tenderness.  Lymphadenopathy:    She has no cervical adenopathy.  Neurological: She is alert and oriented to person, place, and time. She has normal reflexes. No cranial nerve deficit.  Skin: Skin is warm and dry. She is not diaphoretic.  Psychiatric: She has a normal mood and affect. Her behavior is normal.          Assessment & Plan:  May refill  the robaxin for spasms to use TID as needed The pain is "every where" suggesting more likely to be fibromyalgia that related to the hernia The robaxin helped best reveiwed the reports from [place].

## 2013-04-04 NOTE — Patient Instructions (Addendum)
Take the robaxin as a scheduled dose at bed time and may use up to twice in the day when you have a pain flair( 6 hour intervals) Apply hydrocortisone cream to the site prior to placing the patch  ( light coat massaged into site)

## 2013-04-09 ENCOUNTER — Telehealth: Payer: Self-pay | Admitting: Internal Medicine

## 2013-04-09 MED ORDER — BUPRENORPHINE 20 MCG/HR TD PTWK: 20.0000 ug | MEDICATED_PATCH | TRANSDERMAL | Status: DC

## 2013-04-09 NOTE — Telephone Encounter (Signed)
Printed and will call patient when signed and is ready for pick up

## 2013-04-09 NOTE — Telephone Encounter (Signed)
Pt needs new rx buprenorphine 20 mg

## 2013-04-24 ENCOUNTER — Telehealth: Payer: Self-pay | Admitting: Internal Medicine

## 2013-04-24 NOTE — Telephone Encounter (Signed)
Listened to pt and she wanted dr Lovell Sheehan to know what md she saw today said-c/o pain and states patch doesn't really help
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
## 2013-04-27 ENCOUNTER — Emergency Department (HOSPITAL_COMMUNITY)
Admission: EM | Admit: 2013-04-27 | Discharge: 2013-04-27 | Disposition: A | Discharge status: Home / Self Care | Payer: 59 | Attending: Emergency Medicine | Admitting: Emergency Medicine

## 2013-04-27 ENCOUNTER — Encounter (HOSPITAL_COMMUNITY): Payer: Self-pay | Admitting: Family Medicine

## 2013-04-27 ENCOUNTER — Emergency Department (HOSPITAL_COMMUNITY): Payer: 59

## 2013-04-27 DIAGNOSIS — N83209 Unspecified ovarian cyst, unspecified side: Secondary | ICD-10-CM | POA: Insufficient documentation

## 2013-04-27 DIAGNOSIS — R079 Chest pain, unspecified: Secondary | ICD-10-CM | POA: Insufficient documentation

## 2013-04-27 DIAGNOSIS — F1310 Sedative, hypnotic or anxiolytic abuse, uncomplicated: Secondary | ICD-10-CM | POA: Insufficient documentation

## 2013-04-27 DIAGNOSIS — R630 Anorexia: Secondary | ICD-10-CM | POA: Insufficient documentation

## 2013-04-27 DIAGNOSIS — F410 Panic disorder [episodic paroxysmal anxiety] without agoraphobia: Secondary | ICD-10-CM | POA: Insufficient documentation

## 2013-04-27 DIAGNOSIS — Z880 Allergy status to penicillin: Secondary | ICD-10-CM | POA: Insufficient documentation

## 2013-04-27 DIAGNOSIS — Z8619 Personal history of other infectious and parasitic diseases: Secondary | ICD-10-CM | POA: Insufficient documentation

## 2013-04-27 DIAGNOSIS — Z79899 Other long term (current) drug therapy: Secondary | ICD-10-CM | POA: Insufficient documentation

## 2013-04-27 DIAGNOSIS — F17200 Nicotine dependence, unspecified, uncomplicated: Secondary | ICD-10-CM | POA: Insufficient documentation

## 2013-04-27 DIAGNOSIS — Z8739 Personal history of other diseases of the musculoskeletal system and connective tissue: Secondary | ICD-10-CM | POA: Insufficient documentation

## 2013-04-27 DIAGNOSIS — Z8719 Personal history of other diseases of the digestive system: Secondary | ICD-10-CM | POA: Insufficient documentation

## 2013-04-27 DIAGNOSIS — Z8742 Personal history of other diseases of the female genital tract: Secondary | ICD-10-CM | POA: Insufficient documentation

## 2013-04-27 DIAGNOSIS — Z87442 Personal history of urinary calculi: Secondary | ICD-10-CM | POA: Insufficient documentation

## 2013-04-27 DIAGNOSIS — R42 Dizziness and giddiness: Secondary | ICD-10-CM | POA: Insufficient documentation

## 2013-04-27 DIAGNOSIS — R109 Unspecified abdominal pain: Secondary | ICD-10-CM | POA: Insufficient documentation

## 2013-04-27 DIAGNOSIS — R5381 Other malaise: Secondary | ICD-10-CM | POA: Insufficient documentation

## 2013-04-27 DIAGNOSIS — R110 Nausea: Secondary | ICD-10-CM | POA: Insufficient documentation

## 2013-04-27 DIAGNOSIS — R5383 Other fatigue: Secondary | ICD-10-CM

## 2013-04-27 DIAGNOSIS — M7989 Other specified soft tissue disorders: Secondary | ICD-10-CM | POA: Insufficient documentation

## 2013-04-27 LAB — URINALYSIS, ROUTINE W REFLEX MICROSCOPIC
Glucose, UA: NEGATIVE mg/dL
Ketones, ur: NEGATIVE mg/dL
Leukocytes, UA: NEGATIVE
Specific Gravity, Urine: 1.006 (ref 1.005–1.030)
pH: 7.5 (ref 5.0–8.0)

## 2013-04-27 LAB — CBC
HCT: 38.1 % (ref 36.0–46.0)
MCH: 31.9 pg (ref 26.0–34.0)
MCHC: 34.6 g/dL (ref 30.0–36.0)
MCV: 92 fL (ref 78.0–100.0)
RDW: 12.2 % (ref 11.5–15.5)

## 2013-04-27 LAB — BASIC METABOLIC PANEL
BUN: 8 mg/dL (ref 6–23)
Creatinine, Ser: 0.52 mg/dL (ref 0.50–1.10)
GFR calc Af Amer: >90 mL/min (ref >90)
GFR calc non Af Amer: >90 mL/min (ref >90)
Glucose, Bld: 77 mg/dL (ref 70–99)

## 2013-04-27 MED ORDER — LORAZEPAM 1 MG PO TABS
0.5000 mg | ORAL_TABLET | Freq: Once | ORAL | Status: AC
  Administered 2013-04-27: 0.5 mg via ORAL
  Filled 2013-04-27: qty 1

## 2013-04-27 MED ORDER — PROMETHAZINE HCL 25 MG/ML IJ SOLN
12.5000 mg | Freq: Once | INTRAMUSCULAR | Status: AC
  Administered 2013-04-27: 12.5 mg via INTRAVENOUS
  Filled 2013-04-27: qty 1

## 2013-04-27 MED ORDER — GI COCKTAIL ~~LOC~~
30.0000 mL | Freq: Once | ORAL | Status: AC
  Administered 2013-04-27: 30 mL via ORAL
  Filled 2013-04-27: qty 30

## 2013-04-27 MED ORDER — SODIUM CHLORIDE 0.9 % IV BOLUS (SEPSIS)
1000.0000 mL | Freq: Once | INTRAVENOUS | Status: AC
  Administered 2013-04-27: 1000 mL via INTRAVENOUS

## 2013-04-27 NOTE — ED Provider Notes (Signed)
History    CSN: [id number] Arrival date & time 04/27/13  1309  First MD Initiated Contact with Patient 04/27/13 1410     Chief Complaint  Patient presents with  . Chest Pain  . Weakness   (Consider location/radiation/quality/duration/timing/severity/associated sxs/prior Treatment) HPI Comments: Patient with history of chronic pain, fibromyalgia, esophageal stricture -- presents with complaint of several days fatigue and generalized weakness. Today she was with her daughter when she began feeling lightheaded and dizzy but she was going to pass out but did not have full syncope. She complains of having mid chest pain that is dull and aching and does not radiate. She denies shortness of breath. Nausea but no vomiting. She has chronic abdominal pain at baseline. Patient states that she is on patches for pain and begins feeling worse towards the end of the week when her patch is running out. No urinary symptoms. No ho CAD. No history of blood clots or other risk factors for PE including trouble, immobilization, estrogen use. The onset of this condition was acute. The course is constant. Aggravating factors: none. Alleviating factors: none.    The history is provided by the patient.   Past Medical History  Diagnosis Date  . ABDOMINAL WALL HERNIA 02/27/2010  . ALLERGIC RHINITIS 06/02/2007  . ANOREXIA, CHRONIC 08/07/2008  . ANXIETY 09/15/2010  . BACK PAIN, THORACIC REGION 07/07/2007  . CERVICAL RADICULOPATHY 12/11/2008  . Condyloma acuminatum 04/23/2009  . CONSTIPATION 09/15/2010  . DYSPHAGIA UNSPECIFIED 09/09/2009  . 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 ENDOMETRIOSIS 12/16/2009  . FATIGUE 06/11/2010  . GERD 10/23/2009  . HERPES SIMPLEX INFECTION 06/11/2010  . LENTIGO 04/23/2009  . LUNG NODULE 06/17/2010  . Palpitations 10/23/2009  . Stricture and stenosis of esophagus 05/13/2010  . TOBACCO ABUSE 08/07/2008  . Heart palpitations   . Urinary tract infection   . Rheumatoid arthritis(714.0)   . Ovarian  cyst   . Abnormal Pap smear   . Fibromyalgia   . Narcotic abuse, continuous   . Panic disorder    Past Surgical History  Procedure Laterality Date  . Partial hysterectomy    . Endometrial ablation    . Hernia repair      X3  . Dilation and curettage of uterus      x1   . Cesarean section      x2   . Esophageal dilatation x 4    . Gum surgery    . Abdominal surgery    . Abdominal wall mesh  removal     Family History  Problem Relation Age of Onset  . Depression Mother   . Arthritis Mother   . Hyperlipidemia Father   . Hypertension Father    History  Substance Use Topics  . Smoking status: Current Every Day Smoker -- 1.00 packs/day    Types: Cigarettes  . Smokeless tobacco: Never Used  . Alcohol Use: No   OB History   Grav Para Term Preterm Abortions TAB SAB Ect Mult Living   3 2 2  0 1 0 1 0 0 2     Review of Systems  Constitutional: Positive for appetite change and fatigue. Negative for fever and chills.  HENT: Negative for sore throat and rhinorrhea.   Eyes: Negative for redness.  Respiratory: Negative for cough and shortness of breath.   Cardiovascular: Positive for chest pain and leg swelling (mild, bilateral). Negative for palpitations.  Gastrointestinal: Positive for nausea and abdominal pain (baseline). Negative for vomiting and diarrhea.  Genitourinary: Negative for dysuria.  Musculoskeletal: Negative for myalgias.  Skin: Negative for rash.  Neurological: Positive for weakness. Negative for headaches.  Psychiatric/Behavioral: Positive for behavioral problems.    Allergies  Lamictal; Nicotine; Zofran; Chantix; Ibuprofen; Morphine and related; Skelaxin; and Penicillins  Home Medications   Current Outpatient Rx  Name  Route  Sig  Dispense  Refill  . acetaminophen (TYLENOL) 325 MG tablet   Oral   Take 650 mg by mouth every 4 (four) hours as needed for pain.         Marland Kitchen ALPRAZolam (XANAX) 1 MG tablet   Oral   Take 1 mg by mouth 4 (four) times daily  as needed for anxiety. For anxiety.         . buprenorphine (BUTRANS - DOSED MCG/HR) 20 MCG/HR PTWK   Transdermal   Place 1 patch (20 mcg total) onto the skin once a week.   4 patch   0   . escitalopram (LEXAPRO) 20 MG tablet   Oral   Take 1 tablet (20 mg total) by mouth daily.   90 tablet   3   . methocarbamol (ROBAXIN) 500 MG tablet   Oral   Take 1 tablet (500 mg total) by mouth 3 (three) times daily.   90 tablet   3    BP 103/73  Pulse 66  Temp(Src) 98.3 F (36.8 C) (Oral)  Resp 12  SpO2 100%  Physical Exam  Nursing note and vitals reviewed. Constitutional: She appears well-developed and well-nourished.  HENT:  Head: Normocephalic and atraumatic.  Eyes: Conjunctivae are normal. Right eye exhibits no discharge. Left eye exhibits no discharge.  Neck: Normal range of motion. Neck supple.  Cardiovascular: Normal rate, regular rhythm and normal heart sounds.   Pulmonary/Chest: Effort normal and breath sounds normal.  Abdominal: Soft. There is no tenderness.  Neurological: She is alert.  Skin: Skin is warm and dry.  Psychiatric:  Tearful    ED Course  Procedures (including critical care time) Labs Reviewed  CBC  BASIC METABOLIC PANEL  URINALYSIS, ROUTINE W REFLEX MICROSCOPIC  POCT I-STAT TROPONIN I   Dg Chest 2 View  04/27/2013   *RADIOLOGY REPORT*  Clinical Data: Chest pain.  CHEST - 2 VIEW  Comparison: 02/20/2013.  Findings: Trachea is midline.  Heart size normal.  Lungs are clear. No pleural fluid.  IMPRESSION: No acute findings.   Original Report Authenticated By: Leanna Battles, M.D.   1. Chest pain   2. Fatigue     2:47 PM Patient seen and examined. Work-up initiated. Medications ordered.   Vital signs reviewed and are as follows: Filed Vitals:   04/27/13 1418  BP: 103/73  Pulse: 66  Temp: 98.3 F (36.8 C)  Resp: 12     Date: 04/27/2013  Rate: 76  Rhythm: normal sinus rhythm  QRS Axis: normal  Intervals: normal  ST/T Wave abnormalities:  normal  Conduction Disutrbances:none  Narrative Interpretation:   Old EKG Reviewed: unchanged since 02/21/13  5:13 PM labs are reassuring. Chest x-ray is normal. EKG is normal. Patient informed.  She states that she is feeling better after fluids, nausea medication, Ativan in ED.  Patient was counseled to return with severe chest pain, especially if the pain is crushing or pressure-like and spreads to the arms, back, neck, or jaw, or if they have sweating, nausea, or shortness of breath with the pain. They were encouraged to call 911 with these symptoms.   They were also told to return if their chest pain gets worse and  does not go away with rest, they have an attack of chest pain lasting longer than usual despite rest and treatment with the medications their caregiver has prescribed, if they wake from sleep with chest pain or shortness of breath, if they feel dizzy or faint, if they have chest pain not typical of their usual pain, or if they have any other emergent concerns regarding their health.  The patient verbalized understanding and agreed.   Patient to continue her chronic pain and anxiety medications.    MDM  Patient generalized weakness, chest pain as minor complaint. Labs are reassuring. Chest pain is atypical. Do not suspect ACS. Troponin is normal. Chest x-ray is negative. Suspect likely GI etiology or anxiety component. Patient appears well. Primary care physician followup indicated and patient encouraged to do so.   Renne Crigler, PA-C 04/27/13 1715

## 2013-04-27 NOTE — ED Notes (Signed)
Pt states feeling better.

## 2013-04-27 NOTE — ED Notes (Signed)
Per pt sts chest pain, arm pain that started a few hours ago. sts also lightheaded and dizzy. sts she doesn't eat well or take very good care of herself. sts took 2 bayer ASA.

## 2013-04-30 ENCOUNTER — Emergency Department (HOSPITAL_BASED_OUTPATIENT_CLINIC_OR_DEPARTMENT_OTHER): Payer: 59

## 2013-04-30 ENCOUNTER — Encounter (HOSPITAL_BASED_OUTPATIENT_CLINIC_OR_DEPARTMENT_OTHER): Payer: Self-pay

## 2013-04-30 ENCOUNTER — Emergency Department (HOSPITAL_BASED_OUTPATIENT_CLINIC_OR_DEPARTMENT_OTHER)
Admission: EM | Admit: 2013-04-30 | Discharge: 2013-05-01 | Disposition: A | Discharge status: Home / Self Care | Payer: 59 | Attending: Emergency Medicine | Admitting: Emergency Medicine

## 2013-04-30 DIAGNOSIS — Z87440 Personal history of urinary (tract) infections: Secondary | ICD-10-CM | POA: Insufficient documentation

## 2013-04-30 DIAGNOSIS — Z8679 Personal history of other diseases of the circulatory system: Secondary | ICD-10-CM | POA: Insufficient documentation

## 2013-04-30 DIAGNOSIS — Z8719 Personal history of other diseases of the digestive system: Secondary | ICD-10-CM | POA: Insufficient documentation

## 2013-04-30 DIAGNOSIS — Z79899 Other long term (current) drug therapy: Secondary | ICD-10-CM | POA: Insufficient documentation

## 2013-04-30 DIAGNOSIS — M5432 Sciatica, left side: Secondary | ICD-10-CM

## 2013-04-30 DIAGNOSIS — Z8619 Personal history of other infectious and parasitic diseases: Secondary | ICD-10-CM | POA: Insufficient documentation

## 2013-04-30 DIAGNOSIS — IMO0001 Reserved for inherently not codable concepts without codable children: Secondary | ICD-10-CM | POA: Insufficient documentation

## 2013-04-30 DIAGNOSIS — F341 Dysthymic disorder: Secondary | ICD-10-CM | POA: Insufficient documentation

## 2013-04-30 DIAGNOSIS — M7989 Other specified soft tissue disorders: Secondary | ICD-10-CM | POA: Insufficient documentation

## 2013-04-30 DIAGNOSIS — F17200 Nicotine dependence, unspecified, uncomplicated: Secondary | ICD-10-CM | POA: Insufficient documentation

## 2013-04-30 DIAGNOSIS — Z880 Allergy status to penicillin: Secondary | ICD-10-CM | POA: Insufficient documentation

## 2013-04-30 DIAGNOSIS — Z90711 Acquired absence of uterus with remaining cervical stump: Secondary | ICD-10-CM | POA: Insufficient documentation

## 2013-04-30 DIAGNOSIS — G8929 Other chronic pain: Secondary | ICD-10-CM | POA: Insufficient documentation

## 2013-04-30 DIAGNOSIS — Z8709 Personal history of other diseases of the respiratory system: Secondary | ICD-10-CM | POA: Insufficient documentation

## 2013-04-30 DIAGNOSIS — Z8742 Personal history of other diseases of the female genital tract: Secondary | ICD-10-CM | POA: Insufficient documentation

## 2013-04-30 DIAGNOSIS — Z9889 Other specified postprocedural states: Secondary | ICD-10-CM | POA: Insufficient documentation

## 2013-04-30 DIAGNOSIS — Z8739 Personal history of other diseases of the musculoskeletal system and connective tissue: Secondary | ICD-10-CM | POA: Insufficient documentation

## 2013-04-30 DIAGNOSIS — M5430 Sciatica, unspecified side: Secondary | ICD-10-CM | POA: Insufficient documentation

## 2013-04-30 LAB — CBC WITH DIFFERENTIAL/PLATELET
HCT: 37.6 % (ref 36.0–46.0)
Hemoglobin: 13.1 g/dL (ref 12.0–15.0)
Lymphocytes Relative: 40 % (ref 12–46)
Lymphs Abs: 3 10*3/uL (ref 0.7–4.0)
Monocytes Absolute: 0.7 10*3/uL (ref 0.1–1.0)
Monocytes Relative: 9 % (ref 3–12)
Neutro Abs: 3.4 10*3/uL (ref 1.7–7.7)
Neutrophils Relative %: 46 % (ref 43–77)
RBC: 4.02 MIL/uL (ref 3.87–5.11)
WBC: 7.5 10*3/uL (ref 4.0–10.5)

## 2013-04-30 MED ORDER — OXYCODONE-ACETAMINOPHEN 5-325 MG PO TABS
2.0000 | ORAL_TABLET | Freq: Once | ORAL | Status: AC
  Administered 2013-04-30: 2 via ORAL
  Filled 2013-04-30 (×2): qty 2

## 2013-04-30 MED ORDER — PROMETHAZINE HCL 25 MG PO TABS
25.0000 mg | ORAL_TABLET | Freq: Once | ORAL | Status: DC
  Filled 2013-04-30: qty 1

## 2013-04-30 NOTE — ED Notes (Signed)
C/o bilat LE swelling since 630pm-noticed red/scabbed area this am-denies direct injury

## 2013-04-30 NOTE — ED Provider Notes (Signed)
Medical screening examination/treatment/procedure(s) were performed by non-physician practitioner and as supervising physician I was immediately available for consultation/collaboration.  Murray Guzzetta R. Merek Niu, MD 04/30/13 1502 

## 2013-04-30 NOTE — ED Provider Notes (Signed)
37 year old female as noted pain and swelling in her legs today. She is also having some pain shooting down her legs from her hip areas. She does have a history of sciatic-type pain. On exam, she has 1-2+ pitting edema bilaterally with bilateral calf tenderness and positive Homans sign. There is no erythema or warmth. She'll be sent for venous Doppler to rule out DVT.  Medical screening examination/treatment/procedure(s) were conducted as a shared visit with non-physician practitioner(s) and myself.  I personally evaluated the patient during the encounter   Dione Booze, MD 05/01/13 [phone]

## 2013-04-30 NOTE — ED Notes (Signed)
Pt assisted to BR via w/c after triage-pt states she is unable to put weight on left leg-stood on right leg-held bar and lowered self to toilet-same for back into w/c-this RN in with pt for transfer to/from toilet

## 2013-05-01 ENCOUNTER — Other Ambulatory Visit: Payer: Self-pay | Admitting: Internal Medicine

## 2013-05-01 LAB — COMPREHENSIVE METABOLIC PANEL
Alkaline Phosphatase: 83 U/L (ref 39–117)
BUN: 5 mg/dL — ABNORMAL LOW (ref 6–23)
CO2: 27 mEq/L (ref 19–32)
Chloride: 104 mEq/L (ref 96–112)
Creatinine, Ser: 0.5 mg/dL (ref 0.50–1.10)
GFR calc non Af Amer: >90 mL/min (ref >90)
Potassium: 3.8 mEq/L (ref 3.5–5.1)
Total Bilirubin: 0.2 mg/dL — ABNORMAL LOW (ref 0.3–1.2)

## 2013-05-01 MED ORDER — FUROSEMIDE 20 MG PO TABS: 20.0000 mg | ORAL_TABLET | Freq: Every day | ORAL | Status: DC

## 2013-05-01 MED ORDER — METHOCARBAMOL 500 MG PO TABS: 500.0000 mg | ORAL_TABLET | Freq: Two times a day (BID) | ORAL | Status: DC

## 2013-05-01 NOTE — ED Notes (Signed)
Pt c/o nausea upon returning from ultrasound, PA made aware and new order rec'd. Pt requesting IV phenergan rather than PO meds. Pt informed that no IV was ordered at this time only PO meds. Pt states that she will take the PO pain pills and will hold off on the phenergan for now. Pt also given bedside commode per her request.

## 2013-05-01 NOTE — ED Provider Notes (Signed)
History    CSN: [id number] Arrival date & time 04/30/13  2159  First MD Initiated Contact with Patient 04/30/13 2246     Chief Complaint  Patient presents with  . Leg Swelling   (Consider location/radiation/quality/duration/timing/severity/associated sxs/prior Treatment) Patient is a 37 y.o. female presenting with leg pain. The history is provided by the patient. No language interpreter was used.  Leg Pain Location:  Leg Injury: no   Leg location:  L leg and R leg Pain details:    Quality:  Aching   Radiates to:  Does not radiate   Severity:  Moderate   Onset quality:  Gradual   Timing:  Constant   Progression:  Worsening Chronicity:  New Tetanus status:  Out of date Prior injury to area:  No Relieved by:  Nothing Pt reports she noticed her leg were swelling this afternoon.  Pt reports she noticed red sores on her legs.  Pt reports she has oain in her left leg that goes up back of leg,  Pt reports leg is painful.   Pt reports she spoke to a friend who is an MD who advised her to come to ED to be evaluation to make sure she did not have cellulitis or a blood clot.   Pt was seen in the ED on Friday for chest pain and vomiting.   Pt reports she got nausea medication, gi cocktail and Iv fluids and felt better.  Pt reports she has chronic pain and takes tylenol 650 every 4 hours.  Pt reports taking this dosage for 10 years.   Pt has chronic pain due to multiple abdominal surgerys.   Pt also reports she has anxiety. Past Medical History  Diagnosis Date  . ABDOMINAL WALL HERNIA 02/27/2010  . ALLERGIC RHINITIS 06/02/2007  . ANOREXIA, CHRONIC 08/07/2008  . ANXIETY 09/15/2010  . BACK PAIN, THORACIC REGION 07/07/2007  . CERVICAL RADICULOPATHY 12/11/2008  . Condyloma acuminatum 04/23/2009  . CONSTIPATION 09/15/2010  . DYSPHAGIA UNSPECIFIED 09/09/2009  . 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 ENDOMETRIOSIS 12/16/2009  . FATIGUE 06/11/2010  . GERD 10/23/2009  . HERPES SIMPLEX INFECTION 06/11/2010  .  LENTIGO 04/23/2009  . LUNG NODULE 06/17/2010  . Palpitations 10/23/2009  . Stricture and stenosis of esophagus 05/13/2010  . TOBACCO ABUSE 08/07/2008  . Heart palpitations   . Urinary tract infection   . Rheumatoid arthritis(714.0)   . Ovarian cyst   . Abnormal Pap smear   . Fibromyalgia   . Narcotic abuse, continuous   . Panic disorder    Past Surgical History  Procedure Laterality Date  . Partial hysterectomy    . Endometrial ablation    . Hernia repair      X3  . Dilation and curettage of uterus      x1   . Cesarean section      x2   . Esophageal dilatation x 4    . Gum surgery    . Abdominal surgery    . Abdominal wall mesh  removal     Family History  Problem Relation Age of Onset  . Depression Mother   . Arthritis Mother   . Hyperlipidemia Father   . Hypertension Father    History  Substance Use Topics  . Smoking status: Current Every Day Smoker -- 1.00 packs/day    Types: Cigarettes  . Smokeless tobacco: Never Used  . Alcohol Use: No   OB History   Grav Para Term Preterm Abortions TAB SAB Ect Mult Living  3 2 2  0 1 0 1 0 0 2     Review of Systems  Musculoskeletal: Positive for joint swelling.  Skin: Positive for wound.  All other systems reviewed and are negative.    Allergies  Lamictal; Nicotine; Zofran; Chantix; Ibuprofen; Morphine and related; Skelaxin; Toradol; and Penicillins  Home Medications   Current Outpatient Rx  Name  Route  Sig  Dispense  Refill  . acetaminophen (TYLENOL) 325 MG tablet   Oral   Take 650 mg by mouth every 4 (four) hours as needed for pain.         Marland Kitchen ALPRAZolam (XANAX) 1 MG tablet   Oral   Take 1 mg by mouth 4 (four) times daily as needed for anxiety. For anxiety.         . buprenorphine (BUTRANS - DOSED MCG/HR) 20 MCG/HR PTWK   Transdermal   Place 1 patch (20 mcg total) onto the skin once a week.   4 patch   0   . escitalopram (LEXAPRO) 20 MG tablet   Oral   Take 1 tablet (20 mg total) by mouth daily.    90 tablet   3   . methocarbamol (ROBAXIN) 500 MG tablet   Oral   Take 1 tablet (500 mg total) by mouth 3 (three) times daily.   90 tablet   3    BP 94/78  Pulse 86  Temp(Src) 98.9 F (37.2 C) (Oral)  Resp 20  SpO2 98% Physical Exam  Nursing note and vitals reviewed. Constitutional: She is oriented to person, place, and time. She appears well-developed and well-nourished.  HENT:  Head: Normocephalic.  Musculoskeletal: She exhibits tenderness.  Swollen bilat lower legs,  Feet swollen.   homan's positive left leg  Neurological: She is alert and oriented to person, place, and time. She has normal reflexes.  Skin: Skin is warm.  Psychiatric: She has a normal mood and affect.    ED Course  Procedures (including critical care time) Labs Reviewed  CBC WITH DIFFERENTIAL  COMPREHENSIVE METABOLIC PANEL   No results found. No diagnosis found. Results for orders placed during the hospital encounter of 04/30/13  CBC WITH DIFFERENTIAL      Result Value Range   WBC 7.5  4.0 - 10.5 K/uL   RBC 4.02  3.87 - 5.11 MIL/uL   Hemoglobin 13.1  12.0 - 15.0 g/dL   HCT 16.1  09.6 - 04.5 %   MCV 93.5  78.0 - 100.0 fL   MCH 32.6  26.0 - 34.0 pg   MCHC 34.8  30.0 - 36.0 g/dL   RDW 40.9  81.1 - 91.4 %   Platelets 171  150 - 400 K/uL   Neutrophils Relative % 46  43 - 77 %   Neutro Abs 3.4  1.7 - 7.7 K/uL   Lymphocytes Relative 40  12 - 46 %   Lymphs Abs 3.0  0.7 - 4.0 K/uL   Monocytes Relative 9  3 - 12 %   Monocytes Absolute 0.7  0.1 - 1.0 K/uL   Eosinophils Relative 4  0 - 5 %   Eosinophils Absolute 0.3  0.0 - 0.7 K/uL   Basophils Relative 0  0 - 1 %   Basophils Absolute 0.0  0.0 - 0.1 K/uL  COMPREHENSIVE METABOLIC PANEL      Result Value Range   Sodium 140  135 - 145 mEq/L   Potassium 3.8  3.5 - 5.1 mEq/L   Chloride 104  96 -  112 mEq/L   CO2 27  19 - 32 mEq/L   Glucose, Bld 75  70 - 99 mg/dL   BUN 5 (*) 6 - 23 mg/dL   Creatinine, Ser 8.65  0.50 - 1.10 mg/dL   Calcium 9.2  8.4 -  78.4 mg/dL   Total Protein 6.2  6.0 - 8.3 g/dL   Albumin 3.8  3.5 - 5.2 g/dL   AST 16  0 - 37 U/L   ALT 10  0 - 35 U/L   Alkaline Phosphatase 83  39 - 117 U/L   Total Bilirubin 0.2 (*) 0.3 - 1.2 mg/dL   GFR calc non Af Amer >90  >90 mL/min   GFR calc Af Amer >90  >90 mL/min   Dg Chest 2 View  04/27/2013   *RADIOLOGY REPORT*  Clinical Data: Chest pain.  CHEST - 2 VIEW  Comparison: 02/20/2013.  Findings: Trachea is midline.  Heart size normal.  Lungs are clear. No pleural fluid.  IMPRESSION: No acute findings.   Original Report Authenticated By: Leanna Battles, M.D.   US Venous Img Lower Bilateral  05/01/2013   *RADIOLOGY REPORT*  Clinical Data: Bilateral lower extremity swelling.  BILATERAL LOWER EXTREMITY VENOUS DUPLEX ULTRASOUND  Technique:  Gray-scale sonography with graded compression, as well as color Doppler and duplex ultrasound, were performed to evaluate the deep venous system of both lower extremities from the level of the common femoral vein through the popliteal and proximal calf veins.  Spectral Doppler was utilized to evaluate flow at rest and with distal augmentation maneuvers.  Comparison:  None.  Findings:  Normal compressibility of bilateral common femoral, superficial femoral, and popliteal veins is demonstrated, as well as the visualized proximal calf veins.  No filling defects to suggest DVT on grayscale or color Doppler imaging.  Doppler waveforms show normal direction of venous flow, normal respiratory phasicity and response to augmentation.  IMPRESSION: No evidence of deep vein thrombosis in either lower extremity.   Original Report Authenticated By: Holley Dexter, M.D.    MDM  Dr. Preston Fleeting in to see and examine pt.   Ultrasound of bilat lower extremities shows no evidence of DVT.   Labs are normal,  Pt given 2 percocet for pain.   Pt advised to elevate feet,  Pt given rx for lasix and robaxin.   Pt advised to see her primary MD for recheck  Elson Areas, PA-C 05/01/13  0050

## 2013-05-07 ENCOUNTER — Telehealth: Payer: Self-pay | Admitting: Internal Medicine

## 2013-05-07 MED ORDER — BUPRENORPHINE 20 MCG/HR TD PTWK: 20.0000 ug | MEDICATED_PATCH | TRANSDERMAL | Status: DC

## 2013-05-07 NOTE — Telephone Encounter (Signed)
PT called to request a refill of her buprenorphine (BUTRANS - DOSED MCG/HR) 20 MCG/HR PTWK. Please assist.

## 2013-05-07 NOTE — Telephone Encounter (Signed)
Printed and will call pt to pikc up after dr jenkins signs 

## 2013-06-01 ENCOUNTER — Other Ambulatory Visit: Payer: Self-pay | Admitting: Internal Medicine

## 2013-06-04 ENCOUNTER — Telehealth: Payer: Self-pay | Admitting: Internal Medicine

## 2013-06-04 NOTE — Telephone Encounter (Addendum)
Pt went to rheumatologist and he can not help her. Pt said she put butrans-patch  On Saturday and 3 days later she is  Feeling out of sortst. Pt has fibromyalgia. bonnye please call patient today

## 2013-06-05 NOTE — Telephone Encounter (Signed)
Left message on machine Return call

## 2013-06-08 ENCOUNTER — Other Ambulatory Visit: Payer: Self-pay | Admitting: *Deleted

## 2013-06-08 MED ORDER — BUPRENORPHINE 20 MCG/HR TD PTWK: 20.0000 ug | MEDICATED_PATCH | TRANSDERMAL | Status: DC

## 2013-06-08 NOTE — Telephone Encounter (Signed)
PT is returning your call. She stated that she is out of medications and needs to speak with you asap. Please assist.

## 2013-06-08 NOTE — Telephone Encounter (Signed)
Talked with pt, dr Cecil Cranker WILL NOT give her percocet-he will only give her patches. They are up front ready for pick up

## 2013-06-25 ENCOUNTER — Telehealth: Payer: Self-pay | Admitting: Internal Medicine

## 2013-06-25 MED ORDER — VARENICLINE TARTRATE 1 MG PO TABS: 1.0000 mg | ORAL_TABLET | Freq: Two times a day (BID) | ORAL | Status: DC

## 2013-06-25 NOTE — Telephone Encounter (Signed)
PT states that she would like to try taking Chantix again. She states that is previously made her sick to her stomach and other side effects, but she has since learned that those are normal side effects. She would like it called into [facility] on [address]. Please assist.

## 2013-06-25 NOTE — Telephone Encounter (Signed)
Per dr Lovell Sheehan- may have chantix 1 mg b id for a month and then go from there.pt informed

## 2013-06-28 ENCOUNTER — Telehealth: Payer: Self-pay | Admitting: Internal Medicine

## 2013-06-28 DIAGNOSIS — M546 Pain in thoracic spine: Secondary | ICD-10-CM

## 2013-06-28 DIAGNOSIS — M5412 Radiculopathy, cervical region: Secondary | ICD-10-CM

## 2013-06-28 NOTE — Telephone Encounter (Signed)
Pt states she is still experiencing body pain. Pt would like a referral to a physical therapist. Pt refused to come in for appt because she states she does not we could help her and cannot physically get here. Pls advise.

## 2013-07-05 ENCOUNTER — Ambulatory Visit: Payer: 59

## 2013-07-07 ENCOUNTER — Encounter (HOSPITAL_COMMUNITY): Payer: Self-pay | Admitting: Emergency Medicine

## 2013-07-07 ENCOUNTER — Emergency Department (INDEPENDENT_AMBULATORY_CARE_PROVIDER_SITE_OTHER)
Admission: EM | Admit: 2013-07-07 | Discharge: 2013-07-07 | Disposition: A | Discharge status: Home / Self Care | Payer: 59 | Source: Home / Self Care | Attending: Family Medicine | Admitting: Family Medicine

## 2013-07-07 ENCOUNTER — Other Ambulatory Visit: Payer: Self-pay

## 2013-07-07 ENCOUNTER — Telehealth: Payer: Self-pay | Admitting: Internal Medicine

## 2013-07-07 DIAGNOSIS — F19939 Other psychoactive substance use, unspecified with withdrawal, unspecified: Secondary | ICD-10-CM

## 2013-07-07 DIAGNOSIS — F1120 Opioid dependence, uncomplicated: Secondary | ICD-10-CM

## 2013-07-07 DIAGNOSIS — F1123 Opioid dependence with withdrawal: Secondary | ICD-10-CM

## 2013-07-07 DIAGNOSIS — G8929 Other chronic pain: Secondary | ICD-10-CM

## 2013-07-07 MED ORDER — BUPRENORPHINE 20 MCG/HR TD PTWK: 20.0000 ug | MEDICATED_PATCH | TRANSDERMAL | Status: DC

## 2013-07-07 NOTE — ED Provider Notes (Signed)
Gina [name] is a 37 y.o. female who presents to Urgent Care today for chronic neck pain. Gina [name] has chronic pain due to fibromyalgia. She fell Tuesday on her right side. She did not have bad pain until this morning. Gina [name] also notes however that she accidentally ran out of her Butrans patch today. She notes worsening pain today and recently developed vomiting. She thinks perhaps she is going through narcotic withdrawal, her primary care provider Dr. Lovell Sheehan is prescribing the [facility] patch. She denies any new weakness or numbness difficulty walking or bowel bladder dysfunction.    Past Medical History  Diagnosis Date  . ABDOMINAL WALL HERNIA 02/27/2010  . ALLERGIC RHINITIS 06/02/2007  . ANOREXIA, CHRONIC 08/07/2008  . ANXIETY 09/15/2010  . BACK PAIN, THORACIC REGION 07/07/2007  . CERVICAL RADICULOPATHY 12/11/2008  . Condyloma acuminatum 04/23/2009  . CONSTIPATION 09/15/2010  . DYSPHAGIA UNSPECIFIED 09/09/2009  . 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 ENDOMETRIOSIS 12/16/2009  . FATIGUE 06/11/2010  . GERD 10/23/2009  . HERPES SIMPLEX INFECTION 06/11/2010  . LENTIGO 04/23/2009  . LUNG NODULE 06/17/2010  . Palpitations 10/23/2009  . Stricture and stenosis of esophagus 05/13/2010  . TOBACCO ABUSE 08/07/2008  . Heart palpitations   . Urinary tract infection   . Rheumatoid arthritis(714.0)   . Ovarian cyst   . Abnormal Pap smear   . Fibromyalgia   . Narcotic abuse, continuous   . Panic disorder    History  Substance Use Topics  . Smoking status: Current Every Day Smoker -- 1.00 packs/day    Types: Cigarettes  . Smokeless tobacco: Never Used  . Alcohol Use: No   ROS as above Medications reviewed. No current facility-administered medications for this encounter.   Current Outpatient Prescriptions  Medication Sig Dispense Refill  . acetaminophen (TYLENOL) 325 MG tablet Take 650 mg by mouth every 4 (four) hours as needed for pain.      Marland Kitchen ALPRAZolam (XANAX) 1 MG tablet take 1 tablet by mouth  four times a day  120 tablet  1  . buprenorphine (BUTRANS - DOSED MCG/HR) 20 MCG/HR PTWK patch Place 1 patch (20 mcg total) onto the skin once a week.  4 patch  0  . escitalopram (LEXAPRO) 20 MG tablet Take 1 tablet (20 mg total) by mouth daily.  90 tablet  3  . furosemide (LASIX) 20 MG tablet Take 1 tablet (20 mg total) by mouth daily.  15 tablet  1  . methocarbamol (ROBAXIN) 500 MG tablet Take 1 tablet (500 mg total) by mouth 3 (three) times daily.  90 tablet  3  . methocarbamol (ROBAXIN) 500 MG tablet Take 1 tablet (500 mg total) by mouth 2 (two) times daily.  20 tablet  0  . varenicline (CHANTIX CONTINUING MONTH PAK) 1 MG tablet Take 1 tablet (1 mg total) by mouth 2 (two) times daily.  60 tablet  0    Exam:  BP 113/73  Pulse 83  Temp(Src) 98.1 F (36.7 C) (Oral)  Resp 14  SpO2 99% Gen: Well NAD HEENT: EOMI,  MMM Lungs: CTABL Nl WOB Heart: RRR no MRG Abd: NABS, NT, ND Exts: Non edematous BL  LE, warm and well perfused.  Neck: Nontender to spinal midline decreased range of motion negative Spurling's test bilaterally Nerve roots are intact bilateral upper extremities.  Left shoulder: Tender palpation left trapezius. Decreased abduction strength 4/5 on left 5/5 right.   No results found for this or any previous visit (from the past 24 hour(s)). No results  found.  Assessment and Plan: 37 y.o. female with  Fibromyalgia with opiate withdrawal.  I am doubtful that the patient suffered any serious injury with the fall as her pain started several days later and coincided with the Butrans patch wearing off.   She obviously is undergoing early opiate withdrawal with nausea and vomiting and worsening pain.   She has never had this happen before and her story checks out with review of the medical record.  I discussed with the on-call physician for [place].   Plan: Plan to refill Butrans patch. I am unable to feel less than 4 patch is due to packaging.  Plan have patient followup with  primary care provider Discussed with patient that we will not do this again.  She expresses understanding and agreement Discussed warning signs or symptoms. Please see discharge instructions. Patient expresses understanding.      Rodolph Bong, MD 07/07/13 1314

## 2013-07-07 NOTE — Discharge Instructions (Signed)
Thank you for coming in today. Please follow up with your doctor ASAP.  We will not refill this medication again.  Come back as needed.  Come back or go to the emergency room if you notice new weakness new numbness problems walking or bowel or bladder problems.

## 2013-07-07 NOTE — Telephone Encounter (Signed)
Called to make pt aware that narcotic medications are not filled on the weekend.  Advised pt that it is our policy not to feel narcotic medications over the weekends.  Advised pt if she was experiencing horrible pain to be seen in the ER. Pt is aware and state she will call her PCP on Monday.

## 2013-07-07 NOTE — Telephone Encounter (Signed)
Patient states that she did not realize that she was out of the Butrans patch. She would like to know if she could get a refill sent to [facility] on [address].

## 2013-07-07 NOTE — ED Notes (Signed)
Pt c/o of neck stiffness and pain on both sides with knots on the right side. Pt states she fell over her dog 2 days ago and didn't feel pain until the next day. Pt states she is wearing a pain patch for previous Fibromalgia pain with no relief. Jan Ranson, SMA

## 2013-07-09 ENCOUNTER — Other Ambulatory Visit: Payer: Self-pay | Admitting: *Deleted

## 2013-07-09 ENCOUNTER — Telehealth: Payer: Self-pay | Admitting: Internal Medicine

## 2013-07-09 MED ORDER — BUPRENORPHINE 20 MCG/HR TD PTWK: 20.0000 ug | MEDICATED_PATCH | TRANSDERMAL | Status: DC

## 2013-07-09 MED ORDER — BUPROPION HCL ER (XL) 150 MG PO TB24: 150.0000 mg | ORAL_TABLET | Freq: Every day | ORAL | Status: DC

## 2013-07-09 NOTE — Telephone Encounter (Signed)
Pt states her insurance will not cover her rx for chantix.  Pt would like to request a rx for wellbutrin, to help w/ stop smoking. Pt states she is not only any anti depressants at present. Pt states she really want to stop smoking Pharm:  [facility]/ [address]

## 2013-07-09 NOTE — Telephone Encounter (Signed)
Per dr Lovell Sheehan- may start [id number] xr qd, but make sure she is not taking lexapro

## 2013-07-09 NOTE — Telephone Encounter (Signed)
Looks like  Ed filled for pt on 9/6

## 2013-07-09 NOTE — Telephone Encounter (Signed)
Talked w ith pt and she states she is not on lexapro- will call to pharmacy

## 2013-07-27 ENCOUNTER — Ambulatory Visit: Payer: 59 | Admitting: Internal Medicine

## 2013-08-01 ENCOUNTER — Ambulatory Visit (INDEPENDENT_AMBULATORY_CARE_PROVIDER_SITE_OTHER): Payer: 59 | Admitting: Internal Medicine

## 2013-08-01 ENCOUNTER — Encounter: Payer: Self-pay | Admitting: Internal Medicine

## 2013-08-01 VITALS — BP 120/70 | HR 72 | Temp 98.6°F | Resp 16 | Ht 67.0 in | Wt 142.0 lb

## 2013-08-01 DIAGNOSIS — IMO0001 Reserved for inherently not codable concepts without codable children: Secondary | ICD-10-CM

## 2013-08-01 DIAGNOSIS — M797 Fibromyalgia: Secondary | ICD-10-CM

## 2013-08-01 MED ORDER — OXYCODONE-ACETAMINOPHEN 5-325 MG PO TABS: 1.0000 | ORAL_TABLET | Freq: Three times a day (TID) | ORAL | Status: DC | PRN

## 2013-08-01 NOTE — Patient Instructions (Addendum)
Consider the fact that you are functionally disabled. The criterion met are  Inability to care for yourself, others and function in your or any other job.   The percocet will not be refilled by this office and your pain management must be done by a certified pain clinic

## 2013-08-01 NOTE — Progress Notes (Signed)
Subjective:    Patient ID: CHESNI VOS, female    DOB: 02/07/1976, 37 y.o.   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 Rheumatology diagnosed as fibromyalgia. Sitting for length of time creates pain.   The patient describes neck pain and abdominal pain. She is tearful and anxious. She has required increased muscle relaxants and pain.  Stress seems to be a major precipitant. Feels like she "has to work"  I do not want to be "disabled" I want to teach.      Review of Systems  Constitutional: Negative for activity change, appetite change and fatigue.  HENT: Negative for ear pain, congestion, neck pain, postnasal drip and sinus pressure.   Eyes: Negative for redness and visual disturbance.  Respiratory: Negative for cough, shortness of breath and wheezing.   Gastrointestinal: Positive for abdominal pain and abdominal distention.  Genitourinary: Negative for dysuria, frequency and menstrual problem.  Musculoskeletal: Negative for myalgias, joint swelling and arthralgias.  Skin: Negative for rash and wound.  Neurological: Negative for dizziness, weakness and headaches.  Hematological: Negative for adenopathy. Does not bruise/bleed easily.  Psychiatric/Behavioral: Negative for sleep disturbance and decreased concentration. The patient is nervous/anxious.    Past Medical History  Diagnosis Date  . ABDOMINAL WALL HERNIA 02/27/2010  . ALLERGIC RHINITIS 06/02/2007  . ANOREXIA, CHRONIC 08/07/2008  . ANXIETY 09/15/2010  . BACK PAIN, THORACIC REGION 07/07/2007  . CERVICAL RADICULOPATHY 12/11/2008  . Condyloma acuminatum 04/23/2009  . CONSTIPATION 09/15/2010  . DYSPHAGIA UNSPECIFIED 09/09/2009  . 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 ENDOMETRIOSIS 12/16/2009  . FATIGUE 06/11/2010  . GERD 10/23/2009  . HERPES SIMPLEX INFECTION 06/11/2010  . LENTIGO 04/23/2009  . LUNG NODULE  06/17/2010  . Palpitations 10/23/2009  . Stricture and stenosis of esophagus 05/13/2010  . TOBACCO ABUSE 08/07/2008  . Heart palpitations   . Urinary tract infection   . Rheumatoid arthritis(714.0)   . Ovarian cyst   . Abnormal Pap smear   . Fibromyalgia   . Narcotic abuse, continuous   . Panic disorder     History   Social History  . Marital Status: Married    Spouse Name: N/A    Number of Children: N/A  . Years of Education: N/A   Occupational History  . Not on file.   Social History Main Topics  . Smoking status: Current Every Day Smoker -- 1.00 packs/day    Types: Cigarettes  . Smokeless tobacco: Never Used  . Alcohol Use: No  . Drug Use: No  . Sexual Activity: Not on file   Other Topics Concern  . Not on file   Social History Narrative  . No narrative on file    Past Surgical History  Procedure Laterality Date  . Partial hysterectomy    . Endometrial ablation    . Hernia repair      X3  . Dilation and curettage of uterus      x1   . Cesarean section      x2   . Esophageal dilatation x 4    . Gum surgery    . Abdominal surgery    . Abdominal wall mesh  removal      Family History  Problem Relation Age of Onset  . Depression Mother   . Arthritis Mother   . Hyperlipidemia Father   . Hypertension Father  Allergies  Allergen Reactions  . Lamictal [Lamotrigine] Anaphylaxis  . Nicotine Swelling and Palpitations    Patches caused palpations lozenges throat swelled  . Zofran Anaphylaxis  . Chantix [Varenicline Tartrate] Other (See Comments)    "went crazy"  . Ibuprofen Nausea And Vomiting  . Morphine And Related Other (See Comments)    Makes pt "very mean"  . Skelaxin Other (See Comments)    rash  . Toradol [Ketorolac Tromethamine] Nausea And Vomiting  . Penicillins Rash    Current Outpatient Prescriptions on File Prior to Visit  Medication Sig Dispense Refill  . acetaminophen (TYLENOL) 325 MG tablet Take 650 mg by mouth every 4 (four)  hours as needed for pain.      Marland Kitchen ALPRAZolam (XANAX) 1 MG tablet take 1 tablet by mouth four times a day  120 tablet  1  . buprenorphine (BUTRANS - DOSED MCG/HR) 20 MCG/HR PTWK patch Place 1 patch (20 mcg total) onto the skin once a week.  4 patch  0  . buPROPion (WELLBUTRIN XL) 150 MG 24 hr tablet Take 1 tablet (150 mg total) by mouth daily.  30 tablet  3  . methocarbamol (ROBAXIN) 500 MG tablet Take 1 tablet (500 mg total) by mouth 2 (two) times daily.  20 tablet  0   No current facility-administered medications on file prior to visit.    BP 120/70  Pulse 72  Temp(Src) 98.6 F (37 C)  Resp 16  Ht 5\' 7"  (1.702 m)  Wt 142 lb (64.411 kg)  BMI 22.24 kg/m2        Objective:   Physical Exam  Constitutional: She is oriented to person, place, and time. She appears well-developed and well-nourished. No distress.  HENT:  Head: Normocephalic and atraumatic.  Eyes: Conjunctivae and EOM are normal. Pupils are equal, round, and reactive to light.  Neck: Normal range of motion. Neck supple. No JVD present. No tracheal deviation present. No thyromegaly present.  Cardiovascular: Normal rate and regular rhythm.   No murmur heard. Pulmonary/Chest: Effort normal and breath sounds normal. She has no wheezes. She exhibits no tenderness.  Abdominal: Soft. Bowel sounds are normal. She exhibits distension. There is tenderness.  Musculoskeletal: She exhibits edema and tenderness.       Right shoulder: She exhibits tenderness.       Left shoulder: She exhibits tenderness.       Right elbow: Tenderness found.       Left elbow: Tenderness found.       Right hip: She exhibits tenderness.       Left hip: She exhibits tenderness.       Right foot: She exhibits tenderness.       Left foot: She exhibits tenderness.  Lymphadenopathy:    She has no cervical adenopathy.  Neurological: She is alert and oriented to person, place, and time. She has normal reflexes. No cranial nerve deficit.  Skin: Skin is warm  and dry. She is not diaphoretic.  Psychiatric: She has a normal mood and affect. Her behavior is normal.          Assessment & Plan:  Moderately severe fibromyalgia Increased tenderness and decreased functioning  ( moderately severe) Has some percocet for dental pain and this "masked: and helped the medicine. The patient has "issues" with pain clinics due to the way she is treated She has reduced the use of the ED endometriosis Abdominal wall pain Osteopenia I believe she is functionally disabled  She expresses a desire to  resume percocet. Pain control through percocet is her desire.

## 2013-08-04 ENCOUNTER — Other Ambulatory Visit: Payer: Self-pay | Admitting: Internal Medicine

## 2013-08-06 ENCOUNTER — Ambulatory Visit: Payer: 59 | Admitting: Internal Medicine

## 2013-08-24 ENCOUNTER — Other Ambulatory Visit: Payer: Self-pay | Admitting: *Deleted

## 2013-08-24 ENCOUNTER — Telehealth: Payer: Self-pay | Admitting: Internal Medicine

## 2013-08-24 MED ORDER — CIPROFLOXACIN HCL 500 MG PO TABS: 500.0000 mg | ORAL_TABLET | Freq: Two times a day (BID) | ORAL | Status: DC

## 2013-08-24 NOTE — Telephone Encounter (Signed)
Pt has bladder inf would like abx call into [facility] [phone], please no capsule.

## 2013-08-24 NOTE — Telephone Encounter (Signed)
Per dr Lovell Sheehan- may have cipro 500 bid for 3 days

## 2013-08-29 ENCOUNTER — Encounter (HOSPITAL_COMMUNITY): Payer: Self-pay | Admitting: *Deleted

## 2013-08-29 ENCOUNTER — Inpatient Hospital Stay (HOSPITAL_COMMUNITY)
Admission: AD | Admit: 2013-08-29 | Discharge: 2013-08-30 | Disposition: A | Discharge status: Home / Self Care | Payer: 59 | Source: Ambulatory Visit | Attending: Obstetrics and Gynecology | Admitting: Obstetrics and Gynecology

## 2013-08-29 DIAGNOSIS — R109 Unspecified abdominal pain: Secondary | ICD-10-CM | POA: Insufficient documentation

## 2013-08-29 DIAGNOSIS — M549 Dorsalgia, unspecified: Secondary | ICD-10-CM | POA: Insufficient documentation

## 2013-08-29 DIAGNOSIS — Z90710 Acquired absence of both cervix and uterus: Secondary | ICD-10-CM | POA: Insufficient documentation

## 2013-08-29 NOTE — MAU Note (Signed)
Pt having lower abd pain greater on left side and radiates to her back x 1 wk, became more intense today.

## 2013-08-30 ENCOUNTER — Inpatient Hospital Stay (HOSPITAL_COMMUNITY): Payer: 59

## 2013-08-30 DIAGNOSIS — R110 Nausea: Secondary | ICD-10-CM

## 2013-08-30 DIAGNOSIS — M545 Low back pain: Secondary | ICD-10-CM

## 2013-08-30 DIAGNOSIS — R1084 Generalized abdominal pain: Secondary | ICD-10-CM

## 2013-08-30 LAB — URINALYSIS, ROUTINE W REFLEX MICROSCOPIC
Glucose, UA: NEGATIVE mg/dL
Hgb urine dipstick: NEGATIVE
Ketones, ur: NEGATIVE mg/dL
Leukocytes, UA: NEGATIVE
Protein, ur: NEGATIVE mg/dL
Urobilinogen, UA: 0.2 mg/dL (ref 0.0–1.0)

## 2013-08-30 MED ORDER — NAPROXEN 250 MG PO TABS
500.0000 mg | ORAL_TABLET | Freq: Once | ORAL | Status: AC
  Administered 2013-08-30: 500 mg via ORAL
  Filled 2013-08-30: qty 1

## 2013-08-30 MED ORDER — PROMETHAZINE HCL 25 MG/ML IJ SOLN
12.5000 mg | Freq: Once | INTRAMUSCULAR | Status: AC
  Administered 2013-08-30: 12.5 mg via INTRAMUSCULAR
  Filled 2013-08-30: qty 1

## 2013-08-30 NOTE — MAU Provider Note (Signed)
First Provider Initiated Contact with Patient 08/30/13 0000      Chief Complaint:  Abdominal Pain, Back Pain and Nausea   Gina [name] is  37 y.o. [id number].  No LMP recorded. Patient has had a hysterectomy..  She states that she thought she had a bladder infection and called for abx a few days ago. She took 2 of the Cipro then stopped when it made her nauseated.  Pain is all across lower abdomen, radiating alown LLQ and towards back for 1 week.  Also c/o nausea and "throwing up in my mouth".    Pt is requesting an IV and IV medication.    Past Medical History  Diagnosis Date  . ABDOMINAL WALL HERNIA 02/27/2010  . ALLERGIC RHINITIS 06/02/2007  . ANOREXIA, CHRONIC 08/07/2008  . ANXIETY 09/15/2010  . BACK PAIN, THORACIC REGION 07/07/2007  . CERVICAL RADICULOPATHY 12/11/2008  . Condyloma acuminatum 04/23/2009  . CONSTIPATION 09/15/2010  . DYSPHAGIA UNSPECIFIED 09/09/2009  . 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 ENDOMETRIOSIS 12/16/2009  . FATIGUE 06/11/2010  . GERD 10/23/2009  . HERPES SIMPLEX INFECTION 06/11/2010  . LENTIGO 04/23/2009  . LUNG NODULE 06/17/2010  . Palpitations 10/23/2009  . Stricture and stenosis of esophagus 05/13/2010  . TOBACCO ABUSE 08/07/2008  . Heart palpitations   . Urinary tract infection   . Rheumatoid arthritis(714.0)   . Ovarian cyst   . Abnormal Pap smear   . Fibromyalgia   . Narcotic abuse, continuous   . Panic disorder    Pt has been on Percocet for chronic back pain (multiple MRI, CT with negative results) for years.  She has had 8 surgeries in the past 10 years for pelvic pain. Pt states that she has had multiple ovarian cysts and "endometriosis all over the place" and feels like it could be either one causing her pain.  Op note from 8/04 states that she had "minimal endometriosis" and those lesions were removed. Recently received what is supposed to be her last rx for 90 percocet from her PCP (one month supply) after being told she must quit utilizing the ED and go to  a certified chronic pain clinic.   Past Surgical History  Procedure Laterality Date  . Partial hysterectomy    . Endometrial ablation    . Hernia repair      X3  . Dilation and curettage of uterus      x1   . Cesarean section      x2   . Esophageal dilatation x 4    . Gum surgery    . Abdominal surgery    . Abdominal wall mesh  removal      Family History  Problem Relation Age of Onset  . Depression Mother   . Arthritis Mother   . Hyperlipidemia Father   . Hypertension Father     History  Substance Use Topics  . Smoking status: Current Every Day Smoker -- 1.00 packs/day    Types: Cigarettes  . Smokeless tobacco: Never Used  . Alcohol Use: No    Allergies:  Allergies  Allergen Reactions  . Lamictal [Lamotrigine] Anaphylaxis    Other reaction(s): Urticaria / Hives (ALLERGY)  . Nicotine Swelling and Palpitations    Other reaction(s): Respiratory Distress (ALLERGY/intolerance), Swelling (ALLERGY/intolerance), Tachycardia / Palpitations  (intolerance) Patches, lozenges Patches caused palpations lozenges throat swelled  . Zofran Anaphylaxis  . Chantix [Varenicline Tartrate] Other (See Comments)    "went crazy"  . Codeine     Other reaction(s): Nausea & Vomiting (  ALLERGY/intolerance)  . Ibuprofen Nausea And Vomiting  . Metaxalone     Other reaction(s): Other (See Comments) Pt does not remember reaction  . Morphine And Related Other (See Comments)    Makes pt "very mean"  . Ondansetron     Other reaction(s): Anaphylaxis (ALLERGY), Swelling (ALLERGY/intolerance)  . Skelaxin Other (See Comments)    rash  . Toradol [Ketorolac Tromethamine] Nausea And Vomiting    Other reaction(s): Nausea & Vomiting (ALLERGY/intolerance)  . Varenicline Other (See Comments)    Other reaction(s): Diaphoresis / Sweating (intolerance) "went crazy" Night terrors  . Amoxicillin Rash  . Penicillins Rash    Other reaction(s): Urticaria / Hives (ALLERGY)    Prescriptions prior to  admission  Medication Sig Dispense Refill  . acetaminophen (TYLENOL) 325 MG tablet Take 650 mg by mouth every 4 (four) hours as needed for pain.      Marland Kitchen ALPRAZolam (XANAX) 1 MG tablet take 1 tablet by mouth four times a day  120 tablet  1  . ciprofloxacin (CIPRO) 500 MG tablet Take 1 tablet (500 mg total) by mouth 2 (two) times daily.  6 tablet  0  . escitalopram (LEXAPRO) 20 MG tablet Take 20 mg by mouth daily.      . methocarbamol (ROBAXIN) 500 MG tablet Take 1 tablet (500 mg total) by mouth 2 (two) times daily.  20 tablet  0  . oxyCODONE-acetaminophen (ROXICET) 5-325 MG per tablet Take 1 tablet by mouth every 8 (eight) hours as needed for pain.  90 tablet  0  . buPROPion (WELLBUTRIN XL) 150 MG 24 hr tablet Take 1 tablet (150 mg total) by mouth daily.  30 tablet  3     Review of Systems   Constitutional: Negative for fever and chills Eyes: Negative for visual disturbances Respiratory: Negative for shortness of breath, dyspnea Cardiovascular: Negative for chest pain or palpitations  Gastrointestinal: Negative for diarrhea and constipation Genitourinary: Negative for dysuria and urgency Musculoskeletal: POSITIVE for back pain (chronic)  Neurological: Negative for dizziness and headaches    Physical Exam   Blood pressure 102/65, pulse 93, resp. rate 18, height 5\' 7"  (1.702 m), weight 147 lb (66.679 kg).  General: General appearance - oriented to person, place, and time, chronically ill appearing, animated with intermittent crying  Chest - clear to auscultation, no wheezes, rales or rhonchi, symmetric air entry Heart - normal rate and regular rhythm Abdomen - tenderness noted everywhere on abdomen Pelvic - tender everywhere to bimanual Extremities - peripheral pulses normal, no pedal edema, no clubbing or cyanosis   Labs: Results for orders placed during the hospital encounter of 08/29/13 (from the past 24 hour(s))  URINALYSIS, ROUTINE W REFLEX MICROSCOPIC   Collection Time     08/29/13 11:35 PM      Result Value Range   Color, Urine YELLOW  YELLOW   APPearance CLEAR  CLEAR   Specific Gravity, Urine 1.015  1.005 - 1.030   pH 8.0  5.0 - 8.0   Glucose, UA NEGATIVE  NEGATIVE mg/dL   Hgb urine dipstick NEGATIVE  NEGATIVE   Bilirubin Urine NEGATIVE  NEGATIVE   Ketones, ur NEGATIVE  NEGATIVE mg/dL   Protein, ur NEGATIVE  NEGATIVE mg/dL   Urobilinogen, UA 0.2  0.0 - 1.0 mg/dL   Nitrite NEGATIVE  NEGATIVE   Leukocytes, UA NEGATIVE  NEGATIVE   Imaging Studies:  .CLINICAL DATA: Left lower quadrant pain  EXAM:  TRANSABDOMINAL AND TRANSVAGINAL ULTRASOUND OF PELVIS  TECHNIQUE:  Both transabdominal and transvaginal  ultrasound examinations of the  pelvis were performed. Transabdominal technique was performed for  global imaging of the pelvis including uterus, ovaries, adnexal  regions, and pelvic cul-de-sac. It was necessary to proceed with  endovaginal exam following the transabdominal exam to visualize the  adnexa.  COMPARISON: 05/21/2011  FINDINGS:  Uterus  Surgically absent.  Right ovary  Measurements: 2.5 x 1.5 x 2.6 cm. Normal appearance/no adnexal mass.  Left ovary  Not visualized due to overlapping fluid-filled, nondilated bowel.  Other findings  Small volume free pelvic fluid.  IMPRESSION:  1. Normal right ovary.  2. Left ovary not seen due to overlapping bowel.  3. Hysterectomy.     Assessment: Patient Active Problem List   Diagnosis Date Noted  . Musculoskeletal malfunction arising from mental factors 04/04/2013  . Incisional hernia 11/15/2012  . Other postprocedural status(V45.89) 11/15/2012  . Abdominal pain 11/11/2012  . Chronic pain syndrome 11/11/2012  . Disorder of sacrum 11/11/2012  . Chronic pain 12/21/2011  . Heartburn 10/04/2011  . DYSPHAGIA 10/04/2011  . ANXIETY 09/15/2010  . CONSTIPATION 09/15/2010  . LUNG NODULE 06/17/2010  . HERPES SIMPLEX INFECTION 06/11/2010  . FATIGUE 06/11/2010  . STRICTURE AND STENOSIS OF ESOPHAGUS  05/13/2010  . ABDOMINAL WALL HERNIA 02/27/2010  . ENDOMETRIOSIS 12/16/2009  . GERD 10/23/2009  . PALPITATIONS 10/23/2009  . 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 CONDYLOMA ACUMINATUM 04/23/2009  . LENTIGO 04/23/2009  . DENTAL PAIN 03/19/2009  . CERVICAL RADICULOPATHY 12/11/2008  . 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 TOBACCO ABUSE 08/07/2008  . ANOREXIA, CHRONIC 08/07/2008  . BACK PAIN, THORACIC REGION 07/07/2007  . ALLERGIC RHINITIS 06/02/2007    Plan: D/C home. Declined rx for Phenergan suppositories (states she vomited in u/s).  She has a current rx for percocet. Pt encouraged to seek care at a pain clinic if she plans on continuing current narcotic regimen.  May come to GYN clinic for evaluation/management of presumed endometriosis  CRESENZO-DISHMAN,Dessa Ledee   Pt stated she is unhappy with the care that she received here.  When asked what I could do to change that, she stated that she "came to the ER to get a diagnosis and to see a doctor."  At this time, Dr. Emelda Fear was called to consult with pt.  2:05 AM

## 2013-08-30 NOTE — MAU Note (Signed)
F. Cres-Dishmon CNM at the bedside to discuss plan of care with pt.

## 2013-08-30 NOTE — MAU Note (Signed)
Pt request to see a doctor.

## 2013-08-30 NOTE — MAU Note (Signed)
Dr. Emelda Fear at the bedside to discuss pt plan of care.

## 2013-08-30 NOTE — MAU Note (Addendum)
Pt was told to follow up with provider if  pain should increase.Pt declined offers of additional hydration and left with out signing discharge disposition. Attempts made to encourage pt to sign and pt continued to refuse.

## 2013-08-30 NOTE — MAU Note (Signed)
Pt to US.

## 2013-08-30 NOTE — MAU Provider Note (Signed)
History     CSN: [id number]  Arrival date and time: 08/29/13 2329   First Provider Initiated Contact with Patient 08/30/13 0000      Chief Complaint  Patient presents with  . Abdominal Pain  . Back Pain  . Nausea   HPI I consulted on this sadly hostile 37 year old female upon completion of the evaluation by nurse midwife Dishmon documented elsewhere to assist in needing the patient's demand that someone explained to her why she is having a problem. Records are reviewed in detail including care everywhere notes from [facility] Patient reports that Dr. Konrad Dolores is her only test that gynecologist and she expresses this may that she is found out last week at his office is now closed. Patient reports her primary care physician is is practicing near [place] and that she is made in no contact with his office during these 2 weeks of concern.. History taking is challenging from the patient. Patient currently has anti-emetics and pain medicines that she states she takes to treat the multiple "botched" medical procedure over the years for her abdominal discomfort and reported endometriosis. I have listened at length to the patient's comments and attempted to review her records to correlate with her concerns. Patient allowed to ventilate her frustration and distrust of medical providers,: pt finds the fact that the left ovary is not visualized on trans vaginal ultrasound as evidence of lack of caring; the limitations of ultrasound and the effect of adjacent bowel calmly explained to patient despite her disdain for [place].  I reviewed the normal findings tonight , reviewing u/s, and labs and urine. Patient declines offer of hydration, if pt has concerns of her ability to keep food down, pt refuses this . Pt refusal of phenergan confirmed . Pt has pain meds. I offered to facilitate her being followed up with the preferred primary care , she declines assistance.  Pt left at end of interaction, in  stable condition     Past Medical History  Diagnosis Date  . ABDOMINAL WALL HERNIA 02/27/2010  . ALLERGIC RHINITIS 06/02/2007  . ANOREXIA, CHRONIC 08/07/2008  . ANXIETY 09/15/2010  . BACK PAIN, THORACIC REGION 07/07/2007  . CERVICAL RADICULOPATHY 12/11/2008  . Condyloma acuminatum 04/23/2009  . CONSTIPATION 09/15/2010  . DYSPHAGIA UNSPECIFIED 09/09/2009  . 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 ENDOMETRIOSIS 12/16/2009  . FATIGUE 06/11/2010  . GERD 10/23/2009  . HERPES SIMPLEX INFECTION 06/11/2010  . LENTIGO 04/23/2009  . LUNG NODULE 06/17/2010  . Palpitations 10/23/2009  . Stricture and stenosis of esophagus 05/13/2010  . TOBACCO ABUSE 08/07/2008  . Heart palpitations   . Urinary tract infection   . Rheumatoid arthritis(714.0)   . Ovarian cyst   . Abnormal Pap smear   . Fibromyalgia   . Narcotic abuse, continuous   . Panic disorder     Past Surgical History  Procedure Laterality Date  . Partial hysterectomy    . Endometrial ablation    . Hernia repair      X3  . Dilation and curettage of uterus      x1   . Cesarean section      x2   . Esophageal dilatation x 4    . Gum surgery    . Abdominal surgery    . Abdominal wall mesh  removal      Family History  Problem Relation Age of Onset  . Depression Mother   . Arthritis Mother   . Hyperlipidemia Father   . Hypertension Father  History  Substance Use Topics  . Smoking status: Current Every Day Smoker -- 1.00 packs/day    Types: Cigarettes  . Smokeless tobacco: Never Used  . Alcohol Use: No    Allergies:  Allergies  Allergen Reactions  . Lamictal [Lamotrigine] Anaphylaxis    Other reaction(s): Urticaria / Hives (ALLERGY)  . Nicotine Swelling and Palpitations    Other reaction(s): Respiratory Distress (ALLERGY/intolerance), Swelling (ALLERGY/intolerance), Tachycardia / Palpitations  (intolerance) Patches, lozenges Patches caused palpations lozenges throat swelled  . Zofran Anaphylaxis  . Chantix [Varenicline  Tartrate] Other (See Comments)    "went crazy"  . Codeine     Other reaction(s): Nausea & Vomiting (ALLERGY/intolerance)  . Ibuprofen Nausea And Vomiting  . Metaxalone     Other reaction(s): Other (See Comments) Pt does not remember reaction  . Morphine And Related Other (See Comments)    Makes pt "very mean"  . Ondansetron     Other reaction(s): Anaphylaxis (ALLERGY), Swelling (ALLERGY/intolerance)  . Skelaxin Other (See Comments)    rash  . Toradol [Ketorolac Tromethamine] Nausea And Vomiting    Other reaction(s): Nausea & Vomiting (ALLERGY/intolerance)  . Varenicline Other (See Comments)    Other reaction(s): Diaphoresis / Sweating (intolerance) "went crazy" Night terrors  . Amoxicillin Rash  . Penicillins Rash    Other reaction(s): Urticaria / Hives (ALLERGY)    Prescriptions prior to admission  Medication Sig Dispense Refill  . acetaminophen (TYLENOL) 325 MG tablet Take 650 mg by mouth every 4 (four) hours as needed for pain.      Marland Kitchen ALPRAZolam (XANAX) 1 MG tablet take 1 tablet by mouth four times a day  120 tablet  1  . ciprofloxacin (CIPRO) 500 MG tablet Take 1 tablet (500 mg total) by mouth 2 (two) times daily.  6 tablet  0  . escitalopram (LEXAPRO) 20 MG tablet Take 20 mg by mouth daily.      . methocarbamol (ROBAXIN) 500 MG tablet Take 1 tablet (500 mg total) by mouth 2 (two) times daily.  20 tablet  0  . oxyCODONE-acetaminophen (ROXICET) 5-325 MG per tablet Take 1 tablet by mouth every 8 (eight) hours as needed for pain.  90 tablet  0  . buPROPion (WELLBUTRIN XL) 150 MG 24 hr tablet Take 1 tablet (150 mg total) by mouth daily.  30 tablet  3    ROS Physical Exam   Blood pressure 102/65, pulse 93, resp. rate 18, height 5\' 7"  (1.702 m), weight 66.679 kg (147 lb).  Physical Exam  Constitutional: She is oriented to person, place, and time. She appears well-developed and well-nourished.  Eyes: Pupils are equal, round, and reactive to light.  Neck: Neck supple.   Cardiovascular: Normal rate and regular rhythm.   Respiratory: Effort normal.  GI: Soft. Bowel sounds are normal. She exhibits no distension. There is no tenderness.  Neurological: She is alert and oriented to person, place, and time. She has normal reflexes.  Psychiatric:  No suicidal or homicidal ideation Patient is angry with her medical history, distress to no providers, "trusts her body above any one else"      MAU Course  Procedures  MDM Review of old records from [facility], consultants including Dr. Kellie Simmering,  Assessment and Plan   Chronic pain/fibromyalgia by history Large psychologic overlay No evidence of acute abdomen No evidence of GYN abnormality, status post hysterectomy Plan patient encouraged to reestablish care with primary care physician  And to use that provider regularly. Over 45 minutes spent in reviewing her  records discussion the patient examination as described above in documentation   Jodi Criscuolo V 08/30/2013, 2:34 AM

## 2013-09-02 NOTE — MAU Provider Note (Signed)
Attestation of Attending Supervision of Advanced Practitioner: Evaluation and management procedures were performed by the PA/NP/CNM/OB Fellow under my supervision/collaboration. Chart reviewed and agree with management and plan.  Rosalena Mccorry V 09/02/2013 9:26 PM

## 2013-12-11 ENCOUNTER — Emergency Department (HOSPITAL_COMMUNITY): Payer: BC Managed Care – PPO

## 2013-12-11 ENCOUNTER — Encounter (HOSPITAL_COMMUNITY): Payer: Self-pay | Admitting: Emergency Medicine

## 2013-12-11 ENCOUNTER — Emergency Department (HOSPITAL_COMMUNITY)
Admission: EM | Admit: 2013-12-11 | Discharge: 2013-12-12 | Disposition: A | Discharge status: Home / Self Care | Payer: BC Managed Care – PPO | Attending: Emergency Medicine | Admitting: Emergency Medicine

## 2013-12-11 DIAGNOSIS — Y939 Activity, unspecified: Secondary | ICD-10-CM | POA: Insufficient documentation

## 2013-12-11 DIAGNOSIS — Z872 Personal history of diseases of the skin and subcutaneous tissue: Secondary | ICD-10-CM | POA: Insufficient documentation

## 2013-12-11 DIAGNOSIS — Z8742 Personal history of other diseases of the female genital tract: Secondary | ICD-10-CM | POA: Insufficient documentation

## 2013-12-11 DIAGNOSIS — Z8619 Personal history of other infectious and parasitic diseases: Secondary | ICD-10-CM | POA: Insufficient documentation

## 2013-12-11 DIAGNOSIS — Z79899 Other long term (current) drug therapy: Secondary | ICD-10-CM | POA: Insufficient documentation

## 2013-12-11 DIAGNOSIS — Z880 Allergy status to penicillin: Secondary | ICD-10-CM | POA: Insufficient documentation

## 2013-12-11 DIAGNOSIS — M069 Rheumatoid arthritis, unspecified: Secondary | ICD-10-CM | POA: Insufficient documentation

## 2013-12-11 DIAGNOSIS — R55 Syncope and collapse: Secondary | ICD-10-CM

## 2013-12-11 DIAGNOSIS — Z3202 Encounter for pregnancy test, result negative: Secondary | ICD-10-CM | POA: Insufficient documentation

## 2013-12-11 DIAGNOSIS — Z87442 Personal history of urinary calculi: Secondary | ICD-10-CM | POA: Insufficient documentation

## 2013-12-11 DIAGNOSIS — F17200 Nicotine dependence, unspecified, uncomplicated: Secondary | ICD-10-CM | POA: Insufficient documentation

## 2013-12-11 DIAGNOSIS — X58XXXA Exposure to other specified factors, initial encounter: Secondary | ICD-10-CM | POA: Insufficient documentation

## 2013-12-11 DIAGNOSIS — S0990XA Unspecified injury of head, initial encounter: Secondary | ICD-10-CM | POA: Insufficient documentation

## 2013-12-11 DIAGNOSIS — Z87440 Personal history of urinary (tract) infections: Secondary | ICD-10-CM | POA: Insufficient documentation

## 2013-12-11 DIAGNOSIS — F410 Panic disorder [episodic paroxysmal anxiety] without agoraphobia: Secondary | ICD-10-CM | POA: Insufficient documentation

## 2013-12-11 DIAGNOSIS — R112 Nausea with vomiting, unspecified: Secondary | ICD-10-CM | POA: Insufficient documentation

## 2013-12-11 DIAGNOSIS — IMO0001 Reserved for inherently not codable concepts without codable children: Secondary | ICD-10-CM | POA: Insufficient documentation

## 2013-12-11 DIAGNOSIS — Z8709 Personal history of other diseases of the respiratory system: Secondary | ICD-10-CM | POA: Insufficient documentation

## 2013-12-11 DIAGNOSIS — Z8719 Personal history of other diseases of the digestive system: Secondary | ICD-10-CM | POA: Insufficient documentation

## 2013-12-11 DIAGNOSIS — Y929 Unspecified place or not applicable: Secondary | ICD-10-CM | POA: Insufficient documentation

## 2013-12-11 LAB — COMPREHENSIVE METABOLIC PANEL
ALBUMIN: 4.4 g/dL (ref 3.5–5.2)
ALT: 10 U/L (ref 0–35)
AST: 14 U/L (ref 0–37)
Alkaline Phosphatase: 86 U/L (ref 39–117)
BILIRUBIN TOTAL: 0.3 mg/dL (ref 0.3–1.2)
BUN: 10 mg/dL (ref 6–23)
CO2: 24 meq/L (ref 19–32)
Calcium: 9 mg/dL (ref 8.4–10.5)
Chloride: 106 mEq/L (ref 96–112)
Creatinine, Ser: 0.52 mg/dL (ref 0.50–1.10)
GFR calc non Af Amer: >90 mL/min (ref >90)
GLUCOSE: 83 mg/dL (ref 70–99)
POTASSIUM: 3.8 meq/L (ref 3.7–5.3)
Sodium: 143 mEq/L (ref 137–147)
Total Protein: 7 g/dL (ref 6.0–8.3)

## 2013-12-11 LAB — CBC
HEMATOCRIT: 40.5 % (ref 36.0–46.0)
HEMOGLOBIN: 14.1 g/dL (ref 12.0–15.0)
MCH: 32.6 pg (ref 26.0–34.0)
MCHC: 34.8 g/dL (ref 30.0–36.0)
MCV: 93.5 fL (ref 78.0–100.0)
Platelets: 199 10*3/uL (ref 150–400)
RBC: 4.33 MIL/uL (ref 3.87–5.11)
RDW: 12 % (ref 11.5–15.5)
WBC: 8.2 10*3/uL (ref 4.0–10.5)

## 2013-12-11 LAB — URINALYSIS, ROUTINE W REFLEX MICROSCOPIC
Bilirubin Urine: NEGATIVE
GLUCOSE, UA: NEGATIVE mg/dL
Hgb urine dipstick: NEGATIVE
Ketones, ur: NEGATIVE mg/dL
LEUKOCYTES UA: NEGATIVE
Nitrite: NEGATIVE
Protein, ur: NEGATIVE mg/dL
Specific Gravity, Urine: 1.006 (ref 1.005–1.030)
Urobilinogen, UA: 0.2 mg/dL (ref 0.0–1.0)
pH: 7 (ref 5.0–8.0)

## 2013-12-11 LAB — GLUCOSE, CAPILLARY: Glucose-Capillary: 110 mg/dL — ABNORMAL HIGH (ref 70–99)

## 2013-12-11 LAB — PREGNANCY, URINE: Preg Test, Ur: NEGATIVE

## 2013-12-11 MED ORDER — SODIUM CHLORIDE 0.9 % IV BOLUS (SEPSIS)
1000.0000 mL | Freq: Once | INTRAVENOUS | Status: AC
  Administered 2013-12-11: 1000 mL via INTRAVENOUS

## 2013-12-11 MED ORDER — SODIUM CHLORIDE 0.9 % IV BOLUS (SEPSIS): 1000.0000 mL | Freq: Once | INTRAVENOUS | Status: DC

## 2013-12-11 MED ORDER — PROMETHAZINE HCL 25 MG/ML IJ SOLN
25.0000 mg | Freq: Once | INTRAMUSCULAR | Status: DC
Start: 2013-12-11 — End: 2013-12-11
  Filled 2013-12-11: qty 1

## 2013-12-11 MED ORDER — PROMETHAZINE HCL 25 MG/ML IJ SOLN
25.0000 mg | Freq: Once | INTRAMUSCULAR | Status: AC
  Administered 2013-12-11: 25 mg via INTRAVENOUS

## 2013-12-11 MED ORDER — PROMETHAZINE HCL 25 MG RE SUPP: 25.0000 mg | Freq: Four times a day (QID) | RECTAL | Status: DC | PRN

## 2013-12-11 NOTE — Discharge Instructions (Signed)
Return to the ED with any concerns including vomiting and not able to keep down liquids, seizure activity, chest pain, difficulty breathing, recurrent fainting, decreased level of alertness/lethargy, or any other alarming symptoms

## 2013-12-11 NOTE — ED Notes (Addendum)
Per family report: pt has been vomiting for a few days.  This evening pt has decreased LOC. Pt seen vomiting on arrival to ED.

## 2013-12-11 NOTE — ED Provider Notes (Signed)
CSN: [id number]     Arrival date & time 12/11/13  2153 History   First MD Initiated Contact with Patient 12/11/13 2203     Chief Complaint  Patient presents with  . Emesis     (Consider location/radiation/quality/duration/timing/severity/associated sxs/prior Treatment) HPI A LEVEL 5 CAVEAT PERTAINS DUE TO ALTERED MENTAL STATUS Pt presents with c/o syncope today.  She had been feeling weak and had syncopal event earlier in the day.  Pt does endorse being on the [employer], husband states she has not been eating and drinking very much.  On the way to the ED husband states she had another episode of decreased consciousness and had emesis.    Past Medical History  Diagnosis Date  . ABDOMINAL WALL HERNIA 02/27/2010  . ALLERGIC RHINITIS 06/02/2007  . ANOREXIA, CHRONIC 08/07/2008  . ANXIETY 09/15/2010  . BACK PAIN, THORACIC REGION 07/07/2007  . CERVICAL RADICULOPATHY 12/11/2008  . Condyloma acuminatum 04/23/2009  . CONSTIPATION 09/15/2010  . DYSPHAGIA UNSPECIFIED 09/09/2009  . 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 ENDOMETRIOSIS 12/16/2009  . FATIGUE 06/11/2010  . GERD 10/23/2009  . HERPES SIMPLEX INFECTION 06/11/2010  . LENTIGO 04/23/2009  . LUNG NODULE 06/17/2010  . Palpitations 10/23/2009  . Stricture and stenosis of esophagus 05/13/2010  . TOBACCO ABUSE 08/07/2008  . Heart palpitations   . Urinary tract infection   . Rheumatoid arthritis(714.0)   . Ovarian cyst   . Abnormal Pap smear   . Fibromyalgia   . Narcotic abuse, continuous   . Panic disorder    Past Surgical History  Procedure Laterality Date  . Partial hysterectomy    . Endometrial ablation    . Hernia repair      X3  . Dilation and curettage of uterus      x1   . Cesarean section      x2   . Esophageal dilatation x 4    . Gum surgery    . Abdominal surgery    . Abdominal wall mesh  removal     Family History  Problem Relation Age of Onset  . Depression Mother   . Arthritis Mother   . Hyperlipidemia Father   .  Hypertension Father    History  Substance Use Topics  . Smoking status: Current Every Day Smoker -- 0.50 packs/day    Types: Cigarettes  . Smokeless tobacco: Never Used  . Alcohol Use: No   OB History   Grav Para Term Preterm Abortions TAB SAB Ect Mult Living   3 2 2  0 1 0 1 0 0 2     Review of Systems UNABLE TO OBTAIN ROS DUE TO LEVEL 5 CAVEAT    Allergies  Lamictal; Nicotine; Zofran; Chantix; Codeine; Ibuprofen; Metaxalone; Morphine and related; Ondansetron; Skelaxin; Toradol; Varenicline; Amoxicillin; and Penicillins  Home Medications   Current Outpatient Rx  Name  Route  Sig  Dispense  Refill  . acetaminophen (TYLENOL) 325 MG tablet   Oral   Take 650 mg by mouth every 4 (four) hours as needed for pain.         Marland Kitchen ALPRAZolam (XANAX) 1 MG tablet      take 1 tablet by mouth four times a day   120 tablet   1   . escitalopram (LEXAPRO) 20 MG tablet   Oral   Take 20 mg by mouth daily.         . methocarbamol (ROBAXIN) 500 MG tablet   Oral   Take 500 mg by mouth 3 (  three) times daily.         Marland Kitchen oxycodone (OXY-IR) 5 MG capsule   Oral   Take 5 mg by mouth every 6 (six) hours as needed for pain.         . promethazine (PHENERGAN) 25 MG suppository   Rectal   Place 1 suppository (25 mg total) rectally every 6 (six) hours as needed for nausea or vomiting.   12 each   0    BP 91/54  Pulse 72  Temp(Src) 97.6 F (36.4 C) (Oral)  Resp 28  SpO2 97% Vitals reviewed Physical Exam Physical Examination: General appearance - alert, tired appearing, and in no distress Mental status - alert, oriented to person, place, and time Eyes - pupils equal and reactive, extraocular eye movements intact pupils 2-53mm bilaterally, no conjunctival injection, no scleral icterus Head- NCAT Mouth - mucous membranes moist, pharynx normal without lesions Chest - clear to auscultation, no wheezes, rales or rhonchi, symmetric air entry Heart - normal rate, regular rhythm, normal  S1, S2, no murmurs, rubs, clicks or gallops Abdomen - soft, nontender, nondistended, no masses or organomegaly Neurological - alert, oriented x 3, pt initially speaking very quietly, moving all extremities purposefully, cranial nerves grossly intact, sensation intact throughout Extremities - peripheral pulses normal, no pedal edema, no clubbing or cyanosis Skin - normal coloration and turgor, no rashes  ED Course  Procedures (including critical care time) Labs Review Labs Reviewed  GLUCOSE, CAPILLARY - Abnormal; Notable for the following:    Glucose-Capillary 110 (*)    All other components within normal limits  CBC  COMPREHENSIVE METABOLIC PANEL  URINALYSIS, ROUTINE W REFLEX MICROSCOPIC  PREGNANCY, URINE   Imaging Review Dg Chest 2 View  12/11/2013   CLINICAL DATA:  Vomiting.  Dehydration.  Emesis.  EXAM: CHEST  2 VIEW  COMPARISON:  None.  FINDINGS: Monitoring leads project over the chest. Cardiopericardial silhouette within normal limits. Mediastinal contours normal. Trachea midline. No airspace disease or effusion. No interval change.  IMPRESSION: No active cardiopulmonary disease.   Electronically Signed   By: Dereck Ligas M.D.   On: 12/11/2013 23:32   Ct Head Wo Contrast  12/11/2013   CLINICAL DATA:  Vomiting with headache.  Emesis.  EXAM: CT HEAD WITHOUT CONTRAST  TECHNIQUE: Contiguous axial images were obtained from the base of the skull through the vertex without intravenous contrast.  COMPARISON:  MR HEAD WO/W CM dated 11/29/2013; MR C SPINE WO/W CM dated 02/23/2013; MR HEAD WO/W CM dated 12/09/2011  FINDINGS: Cardiopericardial silhouette within normal limits. Mediastinal contours normal. Trachea midline. No airspace disease or effusion. Calvarium intact. Paranasal sinuses appear within normal limits. Likely chronic dural calcifications are present along the right tentorium.  IMPRESSION: Negative CT head.   Electronically Signed   By: Dereck Ligas M.D.   On: 12/11/2013 23:28     EKG Interpretation    Date/Time:  Tuesday December 11 2013 21:57:41 EST Ventricular Rate:  91 PR Interval:  138 QRS Duration: 75 QT Interval:  357 QTC Calculation: 439 R Axis:   66 Text Interpretation:  Sinus rhythm with sinus arrythmia No significant change since last tracing Confirmed by [facility]  MD, MARTHA [phone]) on 12/11/2013 11:44:56 PM            MDM   Final diagnoses:  Syncope  Head injury  Nausea and vomiting    Pt presenting after syncopal event and associated vomiting.  Upon arrival to the ED pt appears dehydrated, cbg reassuring, labs reassuring,  vitals normal. EKG reassuring as well.  After IV hydration pt appears more alert and states she feels improved.  Answering questions.  Of note, she asked for phenergan and IV dilaudid for headache.  She was given phenergan for her nausea due to allergy list, however d/w her that dilaudid was not indicated for headache.  Pt verbalized understanding.  Pt and family also asked for demerol.  Per chart review there has been concern about drug seeking behavior.  Pt did improve in the ED and appears stable for discharge.  All results d/w patient and husband at bedside and they were in agreement with plan for discharge.     Threasa Beards, MD 12/12/13 [phone]

## 2013-12-17 ENCOUNTER — Ambulatory Visit (INDEPENDENT_AMBULATORY_CARE_PROVIDER_SITE_OTHER): Payer: BC Managed Care – PPO | Admitting: Internal Medicine

## 2013-12-17 ENCOUNTER — Encounter: Payer: Self-pay | Admitting: Internal Medicine

## 2013-12-17 VITALS — BP 116/72 | HR 72 | Temp 98.2°F | Resp 16 | Ht 67.0 in | Wt 145.0 lb

## 2013-12-17 DIAGNOSIS — E161 Other hypoglycemia: Secondary | ICD-10-CM | POA: Insufficient documentation

## 2013-12-17 DIAGNOSIS — R55 Syncope and collapse: Secondary | ICD-10-CM

## 2013-12-17 DIAGNOSIS — E162 Hypoglycemia, unspecified: Secondary | ICD-10-CM

## 2013-12-17 MED ORDER — ESCITALOPRAM OXALATE 20 MG PO TABS: 20.0000 mg | ORAL_TABLET | Freq: Every day | ORAL | Status: DC

## 2013-12-17 NOTE — Progress Notes (Signed)
Pre visit review using our clinic review tool, if applicable. No additional management support is needed unless otherwise documented below in the visit note. 

## 2013-12-17 NOTE — Patient Instructions (Addendum)
lara bars  vegetarian and low glycemic Uber bar   Look for high protein and low glycemic   Need to eat a small meal 300 cal 5-6 time a day ( 1600- 1800 cal diet)
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
## 2013-12-18 ENCOUNTER — Emergency Department (HOSPITAL_COMMUNITY)
Admission: EM | Admit: 2013-12-18 | Discharge: 2013-12-19 | Disposition: A | Discharge status: Home / Self Care | Payer: BC Managed Care – PPO | Attending: Emergency Medicine | Admitting: Emergency Medicine

## 2013-12-18 ENCOUNTER — Encounter (HOSPITAL_COMMUNITY): Payer: Self-pay | Admitting: Emergency Medicine

## 2013-12-18 DIAGNOSIS — Z87440 Personal history of urinary (tract) infections: Secondary | ICD-10-CM | POA: Insufficient documentation

## 2013-12-18 DIAGNOSIS — R630 Anorexia: Secondary | ICD-10-CM | POA: Insufficient documentation

## 2013-12-18 DIAGNOSIS — R55 Syncope and collapse: Secondary | ICD-10-CM

## 2013-12-18 DIAGNOSIS — Z8742 Personal history of other diseases of the female genital tract: Secondary | ICD-10-CM | POA: Insufficient documentation

## 2013-12-18 DIAGNOSIS — Z79899 Other long term (current) drug therapy: Secondary | ICD-10-CM | POA: Insufficient documentation

## 2013-12-18 DIAGNOSIS — G43909 Migraine, unspecified, not intractable, without status migrainosus: Secondary | ICD-10-CM | POA: Insufficient documentation

## 2013-12-18 DIAGNOSIS — Z8679 Personal history of other diseases of the circulatory system: Secondary | ICD-10-CM | POA: Insufficient documentation

## 2013-12-18 DIAGNOSIS — Z8619 Personal history of other infectious and parasitic diseases: Secondary | ICD-10-CM | POA: Insufficient documentation

## 2013-12-18 DIAGNOSIS — Z880 Allergy status to penicillin: Secondary | ICD-10-CM | POA: Insufficient documentation

## 2013-12-18 DIAGNOSIS — F17200 Nicotine dependence, unspecified, uncomplicated: Secondary | ICD-10-CM | POA: Insufficient documentation

## 2013-12-18 DIAGNOSIS — Z8709 Personal history of other diseases of the respiratory system: Secondary | ICD-10-CM | POA: Insufficient documentation

## 2013-12-18 DIAGNOSIS — F411 Generalized anxiety disorder: Secondary | ICD-10-CM | POA: Insufficient documentation

## 2013-12-18 DIAGNOSIS — Z8719 Personal history of other diseases of the digestive system: Secondary | ICD-10-CM | POA: Insufficient documentation

## 2013-12-18 LAB — CBC WITH DIFFERENTIAL/PLATELET
BASOS PCT: 0 % (ref 0–1)
Basophils Absolute: 0 10*3/uL (ref 0.0–0.1)
EOS ABS: 0.2 10*3/uL (ref 0.0–0.7)
EOS PCT: 2 % (ref 0–5)
HEMATOCRIT: 41.2 % (ref 36.0–46.0)
HEMOGLOBIN: 14.1 g/dL (ref 12.0–15.0)
Lymphocytes Relative: 40 % (ref 12–46)
Lymphs Abs: 3 10*3/uL (ref 0.7–4.0)
MCH: 32.6 pg (ref 26.0–34.0)
MCHC: 34.2 g/dL (ref 30.0–36.0)
MCV: 95.2 fL (ref 78.0–100.0)
MONO ABS: 0.4 10*3/uL (ref 0.1–1.0)
Monocytes Relative: 6 % (ref 3–12)
NEUTROS PCT: 52 % (ref 43–77)
Neutro Abs: 3.9 10*3/uL (ref 1.7–7.7)
Platelets: 198 10*3/uL (ref 150–400)
RBC: 4.33 MIL/uL (ref 3.87–5.11)
RDW: 11.9 % (ref 11.5–15.5)
WBC: 7.5 10*3/uL (ref 4.0–10.5)

## 2013-12-18 MED ORDER — METOCLOPRAMIDE HCL 5 MG/ML IJ SOLN
10.0000 mg | Freq: Once | INTRAMUSCULAR | Status: AC
  Administered 2013-12-18: 10 mg via INTRAVENOUS
  Filled 2013-12-18: qty 2

## 2013-12-18 MED ORDER — SODIUM CHLORIDE 0.9 % IV SOLN
1000.0000 mL | Freq: Once | INTRAVENOUS | Status: AC
  Administered 2013-12-18: 1000 mL via INTRAVENOUS

## 2013-12-18 MED ORDER — DEXAMETHASONE SODIUM PHOSPHATE 4 MG/ML IJ SOLN
10.0000 mg | Freq: Once | INTRAMUSCULAR | Status: AC
  Administered 2013-12-18: 10 mg via INTRAVENOUS
  Filled 2013-12-18: qty 3

## 2013-12-18 MED ORDER — DIPHENHYDRAMINE HCL 50 MG/ML IJ SOLN
25.0000 mg | Freq: Once | INTRAMUSCULAR | Status: AC
  Administered 2013-12-19: 25 mg via INTRAVENOUS
  Filled 2013-12-18: qty 1

## 2013-12-18 MED ORDER — SODIUM CHLORIDE 0.9 % IV SOLN
1000.0000 mL | Freq: Once | INTRAVENOUS | Status: AC
  Administered 2013-12-19: 1000 mL via INTRAVENOUS

## 2013-12-18 MED ORDER — SODIUM CHLORIDE 0.9 % IV SOLN: 1000.0000 mL | INTRAVENOUS | Status: DC

## 2013-12-18 NOTE — ED Notes (Signed)
Pt states her throat and head hurt from vomiting.

## 2013-12-18 NOTE — ED Notes (Signed)
First contact with pt. Pt supine on side c/o burning in throat. Alert x4. Dr Tomi Bamberger at bedside.

## 2013-12-18 NOTE — ED Notes (Signed)
Bed: WA09 Expected date:  Expected time:  Means of arrival:  Comments: EMS ? overdose

## 2013-12-18 NOTE — ED Provider Notes (Signed)
CSN: [id number]     Arrival date & time 12/18/13  2225 History   First MD Initiated Contact with Patient 12/18/13 2255     Chief Complaint  Patient presents with  . Weakness     (Consider location/radiation/quality/duration/timing/severity/associated sxs/prior Treatment) HPI History hard to obtain because patient speaks very softly. Per husband patient had been dieting. Last week she was doing the [employer] and had a ED visit after a syncopal episode whereshe was felt to be dehydrated and possibly had a hypoglycemic episode. He reports she has decreased appetite and she is only drinking liquids. He reports she had been fine all day. She reports she started feeling bad about 10 PM this evening. She states she felt tired and dizzy when she was in the bathroom. When she came out she had a syncopal episode. This was followed by nausea and a sore throat. She reports she's had a headache for the past 1-1/2 weeks that is constant and described as being frontal in location. She denies any blurred vision but does have photophobia and noise sensitivity. She states the headache was mild and started shortly before she passed out. Patient denies prior history of headaches. However when she was seen last week for her syncopal episode she did have a CT scan done of her head. Patient repeats several times that she is distressed. When asked if something in particular is happy she states she is "stressed with life". She states she has a therapist. She denies feeling depressed. She states she is "sick and tired" she denies feeling suicidal or homicidal. She is unsure of any family history of migraines. She was seen in the ED on February 10 for a prior syncopal episode. She also has been seen since then by her PCP.  PCP Dr Orinda Kenner  Past Medical History  Diagnosis Date  . ABDOMINAL WALL HERNIA 02/27/2010  . ALLERGIC RHINITIS 06/02/2007  . ANOREXIA, CHRONIC 08/07/2008  . ANXIETY 09/15/2010  . BACK PAIN, THORACIC  REGION 07/07/2007  . CERVICAL RADICULOPATHY 12/11/2008  . Condyloma acuminatum 04/23/2009  . CONSTIPATION 09/15/2010  . DYSPHAGIA UNSPECIFIED 09/09/2009  . 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 ENDOMETRIOSIS 12/16/2009  . FATIGUE 06/11/2010  . GERD 10/23/2009  . HERPES SIMPLEX INFECTION 06/11/2010  . LENTIGO 04/23/2009  . LUNG NODULE 06/17/2010  . Palpitations 10/23/2009  . Stricture and stenosis of esophagus 05/13/2010  . TOBACCO ABUSE 08/07/2008  . Heart palpitations   . Urinary tract infection   . Rheumatoid arthritis(714.0)   . Ovarian cyst   . Abnormal Pap smear   . Fibromyalgia   . Narcotic abuse, continuous   . Panic disorder    Past Surgical History  Procedure Laterality Date  . Partial hysterectomy    . Endometrial ablation    . Hernia repair      X3  . Dilation and curettage of uterus      x1   . Cesarean section      x2   . Esophageal dilatation x 4    . Gum surgery    . Abdominal surgery    . Abdominal wall mesh  removal     Family History  Problem Relation Age of Onset  . Depression Mother   . Arthritis Mother   . Hyperlipidemia Father   . Hypertension Father    History  Substance Use Topics  . Smoking status: Current Every Day Smoker -- 0.50 packs/day    Types: Cigarettes  . Smokeless tobacco: Never Used  .  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      Allergies  Lamictal; Nicotine; Zofran; Chantix; Codeine; Ibuprofen; Metaxalone; Morphine and related; Ondansetron; Skelaxin; Toradol; Varenicline; Amoxicillin; and Penicillins  Home Medications   Current Outpatient Rx  Name  Route  Sig  Dispense  Refill  . acetaminophen (TYLENOL) 325 MG tablet   Oral   Take 325 mg by mouth every 4 (four) hours as needed for pain.          Marland Kitchen ALPRAZolam  (XANAX) 1 MG tablet   Oral   Take 1 mg by mouth 4 (four) times daily as needed for anxiety.         Marland Kitchen escitalopram (LEXAPRO) 20 MG tablet   Oral   Take 20 mg by mouth daily.         . methocarbamol (ROBAXIN) 500 MG tablet   Oral   Take 500 mg by mouth 3 (three) times daily as needed for muscle spasms.          Marland Kitchen oxycodone (OXY-IR) 5 MG capsule   Oral   Take 5 mg by mouth every 6 (six) hours as needed for pain.          BP 97/58  Pulse 69  Temp(Src) 98 F (36.7 C) (Oral)  Resp 17  SpO2 98%  Vital signs normal except hypotension (husband states this is her normal BP, but Dr Arnoldo Morale office notes have BP 99991111 systolic)   Physical Exam  Nursing note and vitals reviewed. Constitutional: She is oriented to person, place, and time. She appears well-developed and well-nourished.  Non-toxic appearance. She does not appear ill. No distress.  Speaks softly hard to understand  HENT:  Head: Normocephalic and atraumatic.  Right Ear: External ear normal.  Left Ear: External ear normal.  Nose: Nose normal. No mucosal edema or rhinorrhea.  Mouth/Throat: Oropharynx is clear and moist and mucous membranes are normal. No dental abscesses or uvula swelling.  Eyes: Conjunctivae and EOM are normal. Pupils are equal, round, and reactive to light.  Hard to open eyes b/o photophobia  Neck: Normal range of motion and full passive range of motion without pain. Neck supple.  Cardiovascular: Normal rate, regular rhythm and normal heart sounds.  Exam reveals no gallop and no friction rub.   No murmur heard. Pulmonary/Chest: Effort normal and breath sounds normal. No respiratory distress. She has no wheezes. She has no rhonchi. She has no rales. She exhibits no tenderness and no crepitus.  Abdominal: Soft. Normal appearance and bowel sounds are normal. She exhibits no distension. There is no tenderness. There is no rebound and no guarding.  Musculoskeletal: Normal range of motion. She exhibits no  edema and no tenderness.  Moves all extremities well.   Neurological: She is alert and oriented to person, place, and time. She has normal strength. No cranial nerve deficit.  Skin: Skin is warm, dry and intact. No rash noted. No erythema. No pallor.  Psychiatric: She is slowed.  States she is stressed    ED Course  Procedures (including critical care time)  Medications  0.9 %  sodium chloride infusion (0 mLs Intravenous Stopped 12/19/13 0209)    Followed by  0.9 %  sodium chloride infusion (1,000 mLs Intravenous New Bag/Given 12/19/13 0150)    Followed by  0.9 %  sodium chloride infusion (not administered)  metoCLOPramide (REGLAN) injection 10 mg (10 mg Intravenous Given 12/18/13 2358)  diphenhydrAMINE (BENADRYL) injection 25 mg (25 mg Intravenous Given 12/19/13 0016)  dexamethasone (DECADRON) injection 10 mg (10 mg Intravenous Given 12/18/13 2359)  promethazine (PHENERGAN) injection 25 mg (25 mg Intramuscular Given 12/19/13 0015)  sodium chloride 0.9 % bolus 1,000 mL (1,000 mLs Intravenous New Bag/Given 12/19/13 0151)   00:05 pt reports to nurse she is "going to have an anxiety attack" if she doesn't get phenergan.   01:15 pt has been snoring per husband, when awakened states her headache is much improved or gone. BP 88 systolic.   0000000 BP [id number] systolic, pt ready to be discharged.   Labs Review Results for orders placed during the hospital encounter of 12/18/13  CBC WITH DIFFERENTIAL      Result Value Ref Range   WBC 7.5  4.0 - 10.5 K/uL   RBC 4.33  3.87 - 5.11 MIL/uL   Hemoglobin 14.1  12.0 - 15.0 g/dL   HCT 41.2  36.0 - 46.0 %   MCV 95.2  78.0 - 100.0 fL   MCH 32.6  26.0 - 34.0 pg   MCHC 34.2  30.0 - 36.0 g/dL   RDW 11.9  11.5 - 15.5 %   Platelets 198  150 - 400 K/uL   Neutrophils Relative % 52  43 - 77 %   Neutro Abs 3.9  1.7 - 7.7 K/uL   Lymphocytes Relative 40  12 - 46 %   Lymphs Abs 3.0  0.7 - 4.0 K/uL   Monocytes Relative 6  3 - 12 %   Monocytes Absolute 0.4  0.1 -  1.0 K/uL   Eosinophils Relative 2  0 - 5 %   Eosinophils Absolute 0.2  0.0 - 0.7 K/uL   Basophils Relative 0  0 - 1 %   Basophils Absolute 0.0  0.0 - 0.1 K/uL  COMPREHENSIVE METABOLIC PANEL      Result Value Ref Range   Sodium 141  137 - 147 mEq/L   Potassium 4.0  3.7 - 5.3 mEq/L   Chloride 102  96 - 112 mEq/L   CO2 26  19 - 32 mEq/L   Glucose, Bld 88  70 - 99 mg/dL   BUN 13  6 - 23 mg/dL   Creatinine, Ser 0.53  0.50 - 1.10 mg/dL   Calcium 9.1  8.4 - 10.5 mg/dL   Total Protein 6.4  6.0 - 8.3 g/dL   Albumin 4.0  3.5 - 5.2 g/dL   AST 15  0 - 37 U/L   ALT 9  0 - 35 U/L   Alkaline Phosphatase 83  39 - 117 U/L   Total Bilirubin 0.2 (*) 0.3 - 1.2 mg/dL   GFR calc non Af Amer >90  >90 mL/min   GFR calc Af Amer >90  >90 mL/min  URINALYSIS, ROUTINE W REFLEX MICROSCOPIC      Result Value Ref Range   Color, Urine YELLOW  YELLOW   APPearance CLEAR  CLEAR   Specific Gravity, Urine 1.007  1.005 - 1.030   pH 7.0  5.0 - 8.0   Glucose, UA NEGATIVE  NEGATIVE mg/dL   Hgb urine dipstick NEGATIVE  NEGATIVE   Bilirubin Urine NEGATIVE  NEGATIVE   Ketones, ur NEGATIVE  NEGATIVE mg/dL   Protein, ur NEGATIVE  NEGATIVE mg/dL  Urobilinogen, UA 0.2  0.0 - 1.0 mg/dL   Nitrite NEGATIVE  NEGATIVE   Leukocytes, UA NEGATIVE  NEGATIVE  TROPONIN I      Result Value Ref Range   Troponin I <0.30  <0.30 ng/mL   Laboratory interpretation all normal    Imaging Review No results found.   Dg Chest 2 View  12/11/2013   CLINICAL DATA:  Vomiting.  Dehydration.  Emesis.  EXAM: CHEST  2 VIEW  COMPARISON:  None.    IMPRESSION: No active cardiopulmonary disease.   Electronically Signed   By: Dereck Ligas M.D.   On: 12/11/2013 23:32   Ct Head Wo Contrast  12/11/2013   CLINICAL DATA:  Vomiting with headache.  Emesis.  EXAM: CT HEAD WITHOUT CONTRAST  TECHNIQUE: Contiguous axial images were obtained from the base of the skull through the vertex without intravenous contrast.  COMPARISON:  MR HEAD WO/W CM dated  11/29/2013; MR C SPINE WO/W CM dated 02/23/2013; MR HEAD WO/W CM dated 12/09/2011  IMPRESSION: Negative CT head.   Electronically Signed   By: Dereck Ligas M.D.   On: 12/11/2013 23:28    EKG Interpretation    Date/Time:  Tuesday December 18 2013 22:33:05 EST Ventricular Rate:  66 PR Interval:  140 QRS Duration: 76 QT Interval:  394 QTC Calculation: 413 R Axis:   64 Text Interpretation:  Sinus rhythm RSR' in V1 or V2, probably normal variant Since last tracing rate slower Confirmed by Aviona Martenson  MD-I, Oluwanifemi Susman ([id number]) on 12/18/2013 11:49:31 PM            MDM   Final diagnoses:  Syncope  Migraine headache    Plan discharge  Rolland Porter, MD, Alanson Aly, MD 12/19/13 [phone]

## 2013-12-18 NOTE — ED Notes (Addendum)
Daughter called EMS because mom texted her from the other room saying she didn't feel right. Lying in floor where she lied down when EMS got there.  VSS. Multiple narcotic medications prescribed to her. Pt states she has a headache. Dry heaving.

## 2013-12-19 ENCOUNTER — Telehealth: Payer: Self-pay | Admitting: Internal Medicine

## 2013-12-19 LAB — URINALYSIS, ROUTINE W REFLEX MICROSCOPIC
Bilirubin Urine: NEGATIVE
GLUCOSE, UA: NEGATIVE mg/dL
Hgb urine dipstick: NEGATIVE
Ketones, ur: NEGATIVE mg/dL
LEUKOCYTES UA: NEGATIVE
Nitrite: NEGATIVE
PH: 7 (ref 5.0–8.0)
Protein, ur: NEGATIVE mg/dL
Specific Gravity, Urine: 1.007 (ref 1.005–1.030)
Urobilinogen, UA: 0.2 mg/dL (ref 0.0–1.0)

## 2013-12-19 LAB — COMPREHENSIVE METABOLIC PANEL
ALBUMIN: 4 g/dL (ref 3.5–5.2)
ALT: 9 U/L (ref 0–35)
AST: 15 U/L (ref 0–37)
Alkaline Phosphatase: 83 U/L (ref 39–117)
BUN: 13 mg/dL (ref 6–23)
CALCIUM: 9.1 mg/dL (ref 8.4–10.5)
CO2: 26 mEq/L (ref 19–32)
CREATININE: 0.53 mg/dL (ref 0.50–1.10)
Chloride: 102 mEq/L (ref 96–112)
GFR calc Af Amer: >90 mL/min (ref >90)
GFR calc non Af Amer: >90 mL/min (ref >90)
Glucose, Bld: 88 mg/dL (ref 70–99)
Potassium: 4 mEq/L (ref 3.7–5.3)
Sodium: 141 mEq/L (ref 137–147)
TOTAL PROTEIN: 6.4 g/dL (ref 6.0–8.3)
Total Bilirubin: 0.2 mg/dL — ABNORMAL LOW (ref 0.3–1.2)

## 2013-12-19 LAB — TROPONIN I

## 2013-12-19 MED ORDER — SODIUM CHLORIDE 0.9 % IV BOLUS (SEPSIS)
1000.0000 mL | Freq: Once | INTRAVENOUS | Status: AC
  Administered 2013-12-19: 1000 mL via INTRAVENOUS

## 2013-12-19 MED ORDER — PROMETHAZINE HCL 25 MG/ML IJ SOLN
25.0000 mg | Freq: Once | INTRAMUSCULAR | Status: AC
  Administered 2013-12-19: 25 mg via INTRAMUSCULAR
  Filled 2013-12-19: qty 1

## 2013-12-19 NOTE — Telephone Encounter (Signed)
Relevant patient education assigned to patient using Emmi. ° °

## 2013-12-19 NOTE — ED Notes (Signed)
Pt ambulates without distress. Pt alert x4 states"sleepy". Husband to drive pt home.

## 2013-12-19 NOTE — Discharge Instructions (Signed)
Go home and rest. Let Dr Arnoldo Morale know about your second ED visit for syncope and headache. Consider seeing Dr Tat, a neurologist about your new onset of headaches with syncope.   Return as needed.

## 2013-12-19 NOTE — ED Notes (Signed)
Pt states she will have anxiety without phenergan. Dr Tomi Bamberger informed.

## 2013-12-22 ENCOUNTER — Emergency Department (HOSPITAL_COMMUNITY)
Admission: EM | Admit: 2013-12-22 | Discharge: 2013-12-23 | Disposition: A | Discharge status: Home / Self Care | Payer: BC Managed Care – PPO | Attending: Emergency Medicine | Admitting: Emergency Medicine

## 2013-12-22 DIAGNOSIS — Z9889 Other specified postprocedural states: Secondary | ICD-10-CM | POA: Insufficient documentation

## 2013-12-22 DIAGNOSIS — Z3202 Encounter for pregnancy test, result negative: Secondary | ICD-10-CM | POA: Insufficient documentation

## 2013-12-22 DIAGNOSIS — F410 Panic disorder [episodic paroxysmal anxiety] without agoraphobia: Secondary | ICD-10-CM | POA: Insufficient documentation

## 2013-12-22 DIAGNOSIS — Z87440 Personal history of urinary (tract) infections: Secondary | ICD-10-CM | POA: Insufficient documentation

## 2013-12-22 DIAGNOSIS — R5383 Other fatigue: Secondary | ICD-10-CM

## 2013-12-22 DIAGNOSIS — Z79899 Other long term (current) drug therapy: Secondary | ICD-10-CM | POA: Insufficient documentation

## 2013-12-22 DIAGNOSIS — Z8742 Personal history of other diseases of the female genital tract: Secondary | ICD-10-CM | POA: Insufficient documentation

## 2013-12-22 DIAGNOSIS — Z8619 Personal history of other infectious and parasitic diseases: Secondary | ICD-10-CM | POA: Insufficient documentation

## 2013-12-22 DIAGNOSIS — Z8709 Personal history of other diseases of the respiratory system: Secondary | ICD-10-CM | POA: Insufficient documentation

## 2013-12-22 DIAGNOSIS — R112 Nausea with vomiting, unspecified: Secondary | ICD-10-CM

## 2013-12-22 DIAGNOSIS — Z8719 Personal history of other diseases of the digestive system: Secondary | ICD-10-CM | POA: Insufficient documentation

## 2013-12-22 DIAGNOSIS — Z90710 Acquired absence of both cervix and uterus: Secondary | ICD-10-CM | POA: Insufficient documentation

## 2013-12-22 DIAGNOSIS — Z872 Personal history of diseases of the skin and subcutaneous tissue: Secondary | ICD-10-CM | POA: Insufficient documentation

## 2013-12-22 DIAGNOSIS — Z880 Allergy status to penicillin: Secondary | ICD-10-CM | POA: Insufficient documentation

## 2013-12-22 DIAGNOSIS — R55 Syncope and collapse: Secondary | ICD-10-CM

## 2013-12-22 DIAGNOSIS — F17200 Nicotine dependence, unspecified, uncomplicated: Secondary | ICD-10-CM | POA: Insufficient documentation

## 2013-12-22 DIAGNOSIS — R5381 Other malaise: Secondary | ICD-10-CM | POA: Insufficient documentation

## 2013-12-22 DIAGNOSIS — R42 Dizziness and giddiness: Secondary | ICD-10-CM | POA: Insufficient documentation

## 2013-12-22 DIAGNOSIS — M069 Rheumatoid arthritis, unspecified: Secondary | ICD-10-CM | POA: Insufficient documentation

## 2013-12-22 DIAGNOSIS — R109 Unspecified abdominal pain: Secondary | ICD-10-CM | POA: Insufficient documentation

## 2013-12-22 DIAGNOSIS — G8929 Other chronic pain: Secondary | ICD-10-CM | POA: Insufficient documentation

## 2013-12-22 LAB — CBG MONITORING, ED: Glucose-Capillary: 125 mg/dL — ABNORMAL HIGH (ref 70–99)

## 2013-12-22 NOTE — ED Notes (Signed)
Pt visiting family member and had syncope episode; Pt has hx of syncope episodes per spouse; Pt has n/v

## 2013-12-23 LAB — CBC WITH DIFFERENTIAL/PLATELET
BASOS ABS: 0 10*3/uL (ref 0.0–0.1)
Basophils Relative: 1 % (ref 0–1)
EOS ABS: 0.3 10*3/uL (ref 0.0–0.7)
Eosinophils Relative: 4 % (ref 0–5)
HCT: 41.1 % (ref 36.0–46.0)
HEMOGLOBIN: 14.3 g/dL (ref 12.0–15.0)
Lymphocytes Relative: 47 % — ABNORMAL HIGH (ref 12–46)
Lymphs Abs: 3.8 10*3/uL (ref 0.7–4.0)
MCH: 33.2 pg (ref 26.0–34.0)
MCHC: 34.8 g/dL (ref 30.0–36.0)
MCV: 95.4 fL (ref 78.0–100.0)
MONOS PCT: 9 % (ref 3–12)
Monocytes Absolute: 0.7 10*3/uL (ref 0.1–1.0)
NEUTROS PCT: 40 % — AB (ref 43–77)
Neutro Abs: 3.2 10*3/uL (ref 1.7–7.7)
PLATELETS: 188 10*3/uL (ref 150–400)
RBC: 4.31 MIL/uL (ref 3.87–5.11)
RDW: 12.1 % (ref 11.5–15.5)
WBC: 8 10*3/uL (ref 4.0–10.5)

## 2013-12-23 LAB — URINALYSIS, ROUTINE W REFLEX MICROSCOPIC
Bilirubin Urine: NEGATIVE
Glucose, UA: NEGATIVE mg/dL
Hgb urine dipstick: NEGATIVE
Ketones, ur: NEGATIVE mg/dL
LEUKOCYTES UA: NEGATIVE
Nitrite: NEGATIVE
PH: 7.5 (ref 5.0–8.0)
Protein, ur: NEGATIVE mg/dL
Specific Gravity, Urine: 1.014 (ref 1.005–1.030)
Urobilinogen, UA: 0.2 mg/dL (ref 0.0–1.0)

## 2013-12-23 LAB — COMPREHENSIVE METABOLIC PANEL
ALBUMIN: 3.7 g/dL (ref 3.5–5.2)
ALK PHOS: 103 U/L (ref 39–117)
ALT: 17 U/L (ref 0–35)
AST: 18 U/L (ref 0–37)
BUN: 10 mg/dL (ref 6–23)
CO2: 26 mEq/L (ref 19–32)
Calcium: 8.9 mg/dL (ref 8.4–10.5)
Chloride: 103 mEq/L (ref 96–112)
Creatinine, Ser: 0.46 mg/dL — ABNORMAL LOW (ref 0.50–1.10)
GFR calc Af Amer: >90 mL/min (ref >90)
GFR calc non Af Amer: >90 mL/min (ref >90)
Glucose, Bld: 84 mg/dL (ref 70–99)
Potassium: 3.9 mEq/L (ref 3.7–5.3)
Sodium: 140 mEq/L (ref 137–147)
TOTAL PROTEIN: 6.4 g/dL (ref 6.0–8.3)
Total Bilirubin: <0.2 mg/dL — ABNORMAL LOW (ref 0.3–1.2)

## 2013-12-23 LAB — POC URINE PREG, ED: PREG TEST UR: NEGATIVE

## 2013-12-23 MED ORDER — SODIUM CHLORIDE 0.9 % IV BOLUS (SEPSIS)
1000.0000 mL | Freq: Once | INTRAVENOUS | Status: AC
  Administered 2013-12-23: 1000 mL via INTRAVENOUS

## 2013-12-23 MED ORDER — PROMETHAZINE HCL 25 MG/ML IJ SOLN
12.5000 mg | Freq: Once | INTRAMUSCULAR | Status: AC
  Administered 2013-12-23: 12.5 mg via INTRAVENOUS
  Filled 2013-12-23: qty 1

## 2013-12-23 MED ORDER — GI COCKTAIL ~~LOC~~
30.0000 mL | Freq: Once | ORAL | Status: AC
  Administered 2013-12-23: 30 mL via ORAL
  Filled 2013-12-23: qty 30

## 2013-12-23 MED ORDER — SODIUM CHLORIDE 0.9 % IV BOLUS (SEPSIS): 1000.0000 mL | Freq: Once | INTRAVENOUS | Status: DC

## 2013-12-23 NOTE — Discharge Instructions (Signed)
Please continue to follow up with your primary care provider for continued evaluation and treatment.    Syncope Syncope means a person passes out (faints). The person usually wakes up in less than 5 minutes. It is important to seek medical care for syncope. HOME CARE  Have someone stay with you until you feel normal.  Do not drive, use machines, or play sports until your doctor says it is okay.  Keep all doctor visits as told.  Lie down when you feel like you might pass out. Take deep breaths. Wait until you feel normal before standing up.  Drink enough fluids to keep your pee (urine) clear or pale yellow.  If you take blood pressure or heart medicine, get up slowly. Take several minutes to sit and then stand. GET HELP RIGHT AWAY IF:   You have a severe headache.  You have pain in the chest, belly (abdomen), or back.  You are bleeding from the mouth or butt (rectum).  You have black or tarry poop (stool).  You have an irregular or very fast heartbeat.  You have pain with breathing.  You keep passing out, or you have shaking (seizures) when you pass out.  You pass out when sitting or lying down.  You feel confused.  You have trouble walking.  You have severe weakness.  You have vision problems. If you fainted, call your local emergency services (911 in [place] Do not drive yourself to the hospital. MAKE SURE YOU:   Understand these instructions.  Will watch your condition.  Will get help right away if you are not doing well or get worse. Document Released: 04/05/2008 Document Revised: 04/18/2012 Document Reviewed: 12/17/2011 [facility] Patient Information 2014 [place], [place].

## 2013-12-23 NOTE — ED Provider Notes (Signed)
Medical screening examination/treatment/procedure(s) were performed by non-physician practitioner and as supervising physician I was immediately available for consultation/collaboration.  EKG Interpretation   None        Teressa Lower, MD 12/23/13 2303

## 2013-12-23 NOTE — ED Provider Notes (Signed)
CSN: [id number]     Arrival date & time 12/22/13  2340 History   First MD Initiated Contact with Patient 12/23/13 0000     Chief Complaint  Patient presents with  . Hypoglycemia   HPI  History provided by the patient and husband. The patient is a 38 year old female with history of chronic abdominal pains, fibromyalgia, anxiety and depression who presents with concerns for hypoglycemia and syncopal episode. Husband reports the patient has recently had similar episodes evaluated at [facility] emergency room. She was diagnosed by her primary care provider with reactive hypoglycemia. This evening the patient and husband were upstairs visiting their 77 year old daughter who is an ICU when the patient began to have a "woozy"appearance to her face and eyes. She was complaining of feeling slightly weak and lightheaded. Husband went to get some juice from nursing station and the nursing staff came in immediately to help the patient then began to kind of collapse into the nurse's arms. She did not fall or have any injury. They were able to give her juices and she has felt slightly improved. Her symptoms are associated with nausea and episode of vomiting however. She continues to feel nauseous. Her "wooziness"lasting approximately 5 seconds. Has had extensive workup for similar symptoms and episodes recently. She reports undergoing an MRI of the brain [place] area 3 weeks ago. She also had recent scans chest x-ray and laboratory work 12 days ago. Her workups have been unremarkable. Patient does continue to use daily narcotics. She is also on Lexapro and Xanax. Denies any increased dosages.    Past Medical History  Diagnosis Date  . ABDOMINAL WALL HERNIA 02/27/2010  . ALLERGIC RHINITIS 06/02/2007  . ANOREXIA, CHRONIC 08/07/2008  . ANXIETY 09/15/2010  . BACK PAIN, THORACIC REGION 07/07/2007  . CERVICAL RADICULOPATHY 12/11/2008  . Condyloma acuminatum 04/23/2009  . CONSTIPATION 09/15/2010  . DYSPHAGIA UNSPECIFIED  09/09/2009  . 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 ENDOMETRIOSIS 12/16/2009  . FATIGUE 06/11/2010  . GERD 10/23/2009  . HERPES SIMPLEX INFECTION 06/11/2010  . LENTIGO 04/23/2009  . LUNG NODULE 06/17/2010  . Palpitations 10/23/2009  . Stricture and stenosis of esophagus 05/13/2010  . TOBACCO ABUSE 08/07/2008  . Heart palpitations   . Urinary tract infection   . Rheumatoid arthritis(714.0)   . Ovarian cyst   . Abnormal Pap smear   . Fibromyalgia   . Narcotic abuse, continuous   . Panic disorder    Past Surgical History  Procedure Laterality Date  . Partial hysterectomy    . Endometrial ablation    . Hernia repair      X3  . Dilation and curettage of uterus      x1   . Cesarean section      x2   . Esophageal dilatation x 4    . Gum surgery    . Abdominal surgery    . Abdominal wall mesh  removal     Family History  Problem Relation Age of Onset  . Depression Mother   . Arthritis Mother   . Hyperlipidemia Father   . Hypertension Father    History  Substance Use Topics  . Smoking status: Current Every Day Smoker -- 0.50 packs/day    Types: Cigarettes  . Smokeless tobacco: Never Used  . Alcohol Use: No   OB History   Grav Para Term Preterm Abortions TAB SAB Ect Mult Living   3 2 2  0 1 0 1 0 0 2     Review of Systems  Constitutional:  Negative for fever, chills and diaphoresis.  HENT: Negative for congestion and sore throat.   Respiratory: Negative for cough.   Gastrointestinal: Positive for nausea and vomiting. Negative for abdominal pain and diarrhea.  Genitourinary: Negative for dysuria, frequency, hematuria and flank pain.  Neurological: Positive for syncope. Negative for headaches.  All other systems reviewed and are negative.      Allergies  Lamictal; Nicotine; Zofran; Chantix; Codeine; Ibuprofen; Metaxalone; Morphine and related; Ondansetron; Skelaxin; Toradol; Varenicline; Amoxicillin; and Penicillins  Home Medications   Current Outpatient Rx  Name  Route   Sig  Dispense  Refill  . acetaminophen (TYLENOL) 325 MG tablet   Oral   Take 325 mg by mouth every 4 (four) hours as needed for pain.          Marland Kitchen ALPRAZolam (XANAX) 1 MG tablet   Oral   Take 1 mg by mouth 4 (four) times daily as needed for anxiety.         Marland Kitchen escitalopram (LEXAPRO) 20 MG tablet   Oral   Take 20 mg by mouth daily.         . methocarbamol (ROBAXIN) 500 MG tablet   Oral   Take 500 mg by mouth 3 (three) times daily as needed for muscle spasms.          Marland Kitchen oxycodone (OXY-IR) 5 MG capsule   Oral   Take 5 mg by mouth every 6 (six) hours as needed for pain.          BP 115/85  Pulse 84  Temp(Src) 98.1 F (36.7 C) (Oral)  Resp 22  SpO2 97% Physical Exam  Nursing note and vitals reviewed. Constitutional: She is oriented to person, place, and time. She appears well-developed and well-nourished. No distress.  HENT:  Head: Normocephalic.  Eyes: Conjunctivae and EOM are normal. Pupils are equal, round, and reactive to light.  Neck: Normal range of motion. Neck supple.  Cardiovascular: Normal rate and regular rhythm.   No murmur heard. Pulmonary/Chest: Effort normal and breath sounds normal. No respiratory distress. She has no wheezes. She has no rales.  Abdominal: Soft. She exhibits no distension. There is tenderness in the suprapubic area. There is no rebound and no guarding.  Musculoskeletal: Normal range of motion. She exhibits no edema and no tenderness.  Neurological: She is alert and oriented to person, place, and time. She has normal strength. No cranial nerve deficit or sensory deficit.  Skin: Skin is warm and dry. No rash noted.  Psychiatric: She has a normal mood and affect. Her behavior is normal.    ED Course  Procedures   DIAGNOSTIC STUDIES: Oxygen Saturation is 97% on room air.    COORDINATION OF CARE:  Nursing notes reviewed. Vital signs reviewed. Initial pt interview and examination performed.   12:11 AM- Patient seen and evaluated. She  appears drowsy. Continues to be nauseous with some belching. Discussed work up plan with pt at bedside, which includes laboratory testing. Pt agrees with plan.  Patient continues to be feeling better. Nausea improved tolerating by mouth fluids. Lab testing unremarkable. No significant orthostatic hypotension. Patient has had several workups in the past for similar symptoms. At this time no concerning findings today for her syncopal episode. She is feeling improved and may be discharged.  Treatment plan initiated: Medications  promethazine (PHENERGAN) injection 12.5 mg (not administered)  sodium chloride 0.9 % bolus 1,000 mL (not administered)  sodium chloride 0.9 % bolus 1,000 mL (1,000 mLs Intravenous New Bag/Given 12/23/13  0001)    Results for orders placed during the hospital encounter of 12/22/13  CBC WITH DIFFERENTIAL      Result Value Ref Range   WBC 8.0  4.0 - 10.5 K/uL   RBC 4.31  3.87 - 5.11 MIL/uL   Hemoglobin 14.3  12.0 - 15.0 g/dL   HCT 41.1  36.0 - 46.0 %   MCV 95.4  78.0 - 100.0 fL   MCH 33.2  26.0 - 34.0 pg   MCHC 34.8  30.0 - 36.0 g/dL   RDW 12.1  11.5 - 15.5 %   Platelets 188  150 - 400 K/uL   Neutrophils Relative % 40 (*) 43 - 77 %   Neutro Abs 3.2  1.7 - 7.7 K/uL   Lymphocytes Relative 47 (*) 12 - 46 %   Lymphs Abs 3.8  0.7 - 4.0 K/uL   Monocytes Relative 9  3 - 12 %   Monocytes Absolute 0.7  0.1 - 1.0 K/uL   Eosinophils Relative 4  0 - 5 %   Eosinophils Absolute 0.3  0.0 - 0.7 K/uL   Basophils Relative 1  0 - 1 %   Basophils Absolute 0.0  0.0 - 0.1 K/uL  COMPREHENSIVE METABOLIC PANEL      Result Value Ref Range   Sodium 140  137 - 147 mEq/L   Potassium 3.9  3.7 - 5.3 mEq/L   Chloride 103  96 - 112 mEq/L   CO2 26  19 - 32 mEq/L   Glucose, Bld 84  70 - 99 mg/dL   BUN 10  6 - 23 mg/dL   Creatinine, Ser 0.46 (*) 0.50 - 1.10 mg/dL   Calcium 8.9  8.4 - 10.5 mg/dL   Total Protein 6.4  6.0 - 8.3 g/dL   Albumin 3.7  3.5 - 5.2 g/dL   AST 18  0 - 37 U/L   ALT 17   0 - 35 U/L   Alkaline Phosphatase 103  39 - 117 U/L   Total Bilirubin <0.2 (*) 0.3 - 1.2 mg/dL   GFR calc non Af Amer >90  >90 mL/min   GFR calc Af Amer >90  >90 mL/min  URINALYSIS, ROUTINE W REFLEX MICROSCOPIC      Result Value Ref Range   Color, Urine YELLOW  YELLOW   APPearance CLOUDY (*) CLEAR   Specific Gravity, Urine 1.014  1.005 - 1.030   pH 7.5  5.0 - 8.0   Glucose, UA NEGATIVE  NEGATIVE mg/dL   Hgb urine dipstick NEGATIVE  NEGATIVE   Bilirubin Urine NEGATIVE  NEGATIVE   Ketones, ur NEGATIVE  NEGATIVE mg/dL   Protein, ur NEGATIVE  NEGATIVE mg/dL   Urobilinogen, UA 0.2  0.0 - 1.0 mg/dL   Nitrite NEGATIVE  NEGATIVE   Leukocytes, UA NEGATIVE  NEGATIVE  CBG MONITORING, ED      Result Value Ref Range   Glucose-Capillary 125 (*) 70 - 99 mg/dL  POC URINE PREG, ED      Result Value Ref Range   Preg Test, Ur NEGATIVE  NEGATIVE       MDM   Final diagnoses:  Syncope  Nausea & vomiting        Martie Lee, PA-C 12/23/13 2017

## 2014-01-10 ENCOUNTER — Telehealth: Payer: Self-pay | Admitting: Internal Medicine

## 2014-01-10 NOTE — Telephone Encounter (Signed)
Neither on med list.  Please advise

## 2014-01-10 NOTE — Telephone Encounter (Signed)
Pt needs refill on valtrex and pt would like topical cream also for her face [facility] [phone]

## 2014-01-11 MED ORDER — VALACYCLOVIR HCL 500 MG PO TABS: 500.0000 mg | ORAL_TABLET | Freq: Three times a day (TID) | ORAL | Status: DC

## 2014-01-11 NOTE — Telephone Encounter (Signed)
Was sent in

## 2014-01-11 NOTE — Telephone Encounter (Signed)
Pt aware.

## 2014-01-15 ENCOUNTER — Telehealth (HOSPITAL_COMMUNITY): Payer: Self-pay

## 2014-01-15 ENCOUNTER — Emergency Department (HOSPITAL_COMMUNITY): Payer: BC Managed Care – PPO

## 2014-01-15 ENCOUNTER — Encounter (HOSPITAL_COMMUNITY): Payer: Self-pay | Admitting: Emergency Medicine

## 2014-01-15 ENCOUNTER — Emergency Department (HOSPITAL_COMMUNITY)
Admission: EM | Admit: 2014-01-15 | Discharge: 2014-01-15 | Disposition: A | Discharge status: Home / Self Care | Payer: BC Managed Care – PPO | Attending: Emergency Medicine | Admitting: Emergency Medicine

## 2014-01-15 DIAGNOSIS — Z87440 Personal history of urinary (tract) infections: Secondary | ICD-10-CM | POA: Insufficient documentation

## 2014-01-15 DIAGNOSIS — Z872 Personal history of diseases of the skin and subcutaneous tissue: Secondary | ICD-10-CM | POA: Insufficient documentation

## 2014-01-15 DIAGNOSIS — M549 Dorsalgia, unspecified: Secondary | ICD-10-CM

## 2014-01-15 DIAGNOSIS — F17200 Nicotine dependence, unspecified, uncomplicated: Secondary | ICD-10-CM | POA: Insufficient documentation

## 2014-01-15 DIAGNOSIS — Z79899 Other long term (current) drug therapy: Secondary | ICD-10-CM | POA: Insufficient documentation

## 2014-01-15 DIAGNOSIS — R350 Frequency of micturition: Secondary | ICD-10-CM | POA: Insufficient documentation

## 2014-01-15 DIAGNOSIS — F341 Dysthymic disorder: Secondary | ICD-10-CM | POA: Insufficient documentation

## 2014-01-15 DIAGNOSIS — Z880 Allergy status to penicillin: Secondary | ICD-10-CM | POA: Insufficient documentation

## 2014-01-15 DIAGNOSIS — Z8742 Personal history of other diseases of the female genital tract: Secondary | ICD-10-CM | POA: Insufficient documentation

## 2014-01-15 DIAGNOSIS — Z8709 Personal history of other diseases of the respiratory system: Secondary | ICD-10-CM | POA: Insufficient documentation

## 2014-01-15 DIAGNOSIS — K219 Gastro-esophageal reflux disease without esophagitis: Secondary | ICD-10-CM | POA: Insufficient documentation

## 2014-01-15 DIAGNOSIS — M069 Rheumatoid arthritis, unspecified: Secondary | ICD-10-CM | POA: Insufficient documentation

## 2014-01-15 DIAGNOSIS — G8929 Other chronic pain: Secondary | ICD-10-CM | POA: Insufficient documentation

## 2014-01-15 DIAGNOSIS — IMO0001 Reserved for inherently not codable concepts without codable children: Secondary | ICD-10-CM | POA: Insufficient documentation

## 2014-01-15 DIAGNOSIS — F410 Panic disorder [episodic paroxysmal anxiety] without agoraphobia: Secondary | ICD-10-CM | POA: Insufficient documentation

## 2014-01-15 DIAGNOSIS — Z8619 Personal history of other infectious and parasitic diseases: Secondary | ICD-10-CM | POA: Insufficient documentation

## 2014-01-15 DIAGNOSIS — M545 Low back pain, unspecified: Secondary | ICD-10-CM | POA: Insufficient documentation

## 2014-01-15 LAB — CBC WITH DIFFERENTIAL/PLATELET
Basophils Absolute: 0 10*3/uL (ref 0.0–0.1)
Basophils Relative: 0 % (ref 0–1)
Eosinophils Absolute: 0.3 10*3/uL (ref 0.0–0.7)
Eosinophils Relative: 4 % (ref 0–5)
HCT: 39.4 % (ref 36.0–46.0)
Hemoglobin: 14 g/dL (ref 12.0–15.0)
Lymphocytes Relative: 41 % (ref 12–46)
Lymphs Abs: 3.7 10*3/uL (ref 0.7–4.0)
MCH: 33.3 pg (ref 26.0–34.0)
MCHC: 35.5 g/dL (ref 30.0–36.0)
MCV: 93.6 fL (ref 78.0–100.0)
MONOS PCT: 7 % (ref 3–12)
Monocytes Absolute: 0.6 10*3/uL (ref 0.1–1.0)
Neutro Abs: 4.2 10*3/uL (ref 1.7–7.7)
Neutrophils Relative %: 48 % (ref 43–77)
Platelets: 224 10*3/uL (ref 150–400)
RBC: 4.21 MIL/uL (ref 3.87–5.11)
RDW: 12.1 % (ref 11.5–15.5)
WBC: 8.8 10*3/uL (ref 4.0–10.5)

## 2014-01-15 LAB — URINALYSIS, ROUTINE W REFLEX MICROSCOPIC
Bilirubin Urine: NEGATIVE
GLUCOSE, UA: NEGATIVE mg/dL
Hgb urine dipstick: NEGATIVE
KETONES UR: NEGATIVE mg/dL
Leukocytes, UA: NEGATIVE
Nitrite: NEGATIVE
PROTEIN: NEGATIVE mg/dL
Specific Gravity, Urine: 1.004 — ABNORMAL LOW (ref 1.005–1.030)
Urobilinogen, UA: 0.2 mg/dL (ref 0.0–1.0)
pH: 6.5 (ref 5.0–8.0)

## 2014-01-15 LAB — COMPREHENSIVE METABOLIC PANEL
ALK PHOS: 93 U/L (ref 39–117)
ALT: 16 U/L (ref 0–35)
AST: 16 U/L (ref 0–37)
Albumin: 4 g/dL (ref 3.5–5.2)
BUN: 14 mg/dL (ref 6–23)
CHLORIDE: 99 meq/L (ref 96–112)
CO2: 26 mEq/L (ref 19–32)
Calcium: 9.2 mg/dL (ref 8.4–10.5)
Creatinine, Ser: 0.49 mg/dL — ABNORMAL LOW (ref 0.50–1.10)
GFR calc Af Amer: >90 mL/min (ref >90)
GFR calc non Af Amer: >90 mL/min (ref >90)
Glucose, Bld: 96 mg/dL (ref 70–99)
Potassium: 4.2 mEq/L (ref 3.7–5.3)
Sodium: 138 mEq/L (ref 137–147)
Total Protein: 6.8 g/dL (ref 6.0–8.3)

## 2014-01-15 LAB — CBG MONITORING, ED: Glucose-Capillary: 87 mg/dL (ref 70–99)

## 2014-01-15 MED ORDER — HYDROMORPHONE HCL PF 1 MG/ML IJ SOLN
1.0000 mg | Freq: Once | INTRAMUSCULAR | Status: AC
  Administered 2014-01-15: 1 mg via INTRAMUSCULAR
  Filled 2014-01-15: qty 1

## 2014-01-15 MED ORDER — PREDNISONE 20 MG PO TABS: 40.0000 mg | ORAL_TABLET | Freq: Every day | ORAL | Status: DC

## 2014-01-15 MED ORDER — LORAZEPAM 2 MG/ML IJ SOLN
1.0000 mg | Freq: Once | INTRAMUSCULAR | Status: AC
  Administered 2014-01-15: 1 mg via INTRAMUSCULAR
  Filled 2014-01-15: qty 1

## 2014-01-15 MED ORDER — DIAZEPAM 5 MG PO TABS: 5.0000 mg | ORAL_TABLET | Freq: Two times a day (BID) | ORAL | Status: DC

## 2014-01-15 MED ORDER — LIDOCAINE 5 % EX PTCH: 1.0000 | MEDICATED_PATCH | CUTANEOUS | Status: DC

## 2014-01-15 NOTE — Discharge Instructions (Signed)
Please follow up with your primary care physician in 1-2 days. If you do not have one please call the [facility] number listed above. Please take pain medication and/or muscle relaxants as prescribed and as needed for pain. Please do not drive on narcotic pain medication or on muscle relaxants. Please take Prednisone as prescribed. Please use Lidoderm patches as prescribed. Please read all discharge instructions and return precautions.   Back Pain, Adult Low back pain is very common. About 1 in 5 people have back pain.The cause of low back pain is rarely dangerous. The pain often gets better over time.About half of people with a sudden onset of back pain feel better in just 2 weeks. About 8 in 10 people feel better by 6 weeks.  CAUSES Some common causes of back pain include:  Strain of the muscles or ligaments supporting the spine.  Wear and tear (degeneration) of the spinal discs.  Arthritis.  Direct injury to the back. DIAGNOSIS Most of the time, the direct cause of low back pain is not known.However, back pain can be treated effectively even when the exact cause of the pain is unknown.Answering your caregiver's questions about your overall health and symptoms is one of the most accurate ways to make sure the cause of your pain is not dangerous. If your caregiver needs more information, he or she may order lab work or imaging tests (X-rays or MRIs).However, even if imaging tests show changes in your back, this usually does not require surgery. HOME CARE INSTRUCTIONS For many people, back pain returns.Since low back pain is rarely dangerous, it is often a condition that people can learn to [facility] their own.   Remain active. It is stressful on the back to sit or stand in one place. Do not sit, drive, or stand in one place for more than 30 minutes at a time. Take short walks on level surfaces as soon as pain allows.Try to increase the length of time you walk each  day.  Do not stay in bed.Resting more than 1 or 2 days can delay your recovery.  Do not avoid exercise or work.Your body is made to move.It is not dangerous to be active, even though your back may hurt.Your back will likely heal faster if you return to being active before your pain is gone.  Pay attention to your body when you bend and lift. Many people have less discomfortwhen lifting if they bend their knees, keep the load close to their bodies,and avoid twisting. Often, the most comfortable positions are those that put less stress on your recovering back.  Find a comfortable position to sleep. Use a firm mattress and lie on your side with your knees slightly bent. If you lie on your back, put a pillow under your knees.  Only take over-the-counter or prescription medicines as directed by your caregiver. Over-the-counter medicines to reduce pain and inflammation are often the most helpful.Your caregiver may prescribe muscle relaxant drugs.These medicines help dull your pain so you can more quickly return to your normal activities and healthy exercise.  Put ice on the injured area.  Put ice in a plastic bag.  Place a towel between your skin and the bag.  Leave the ice on for 15-20 minutes, 03-04 times a day for the first 2 to 3 days. After that, ice and heat may be alternated to reduce pain and spasms.  Ask your caregiver about trying back exercises and gentle massage. This may be of some  benefit.  Avoid feeling anxious or stressed.Stress increases muscle tension and can worsen back pain.It is important to recognize when you are anxious or stressed and learn ways to manage it.Exercise is a great option. SEEK MEDICAL CARE IF:  You have pain that is not relieved with rest or medicine.  You have pain that does not improve in 1 week.  You have new symptoms.  You are generally not feeling well. SEEK IMMEDIATE MEDICAL CARE IF:   You have pain that radiates from your back into  your legs.  You develop new bowel or bladder control problems.  You have unusual weakness or numbness in your arms or legs.  You develop nausea or vomiting.  You develop abdominal pain.  You feel faint. Document Released: 10/18/2005 Document Revised: 04/18/2012 Document Reviewed: 03/08/2011 [facility] Patient Information 2014 [place], [place].

## 2014-01-15 NOTE — ED Notes (Signed)
Pt reports that she is hypoglycemic and feels that being up this late will affect it. Pt noted to have a cbg of 87 at this time

## 2014-01-15 NOTE — ED Provider Notes (Signed)
CSN: [id number]     Arrival date & time 01/15/14  0029 History   First MD Initiated Contact with Patient 01/15/14 0423     Chief Complaint  Patient presents with  . Back Pain     (Consider location/radiation/quality/duration/timing/severity/associated sxs/prior Treatment) HPI Comments: Patient is a 38 year old female past medical history significant for anorexia, anxiety, chronic back pain, cervical radiculopathy, endometriosis, fatigue, GERD, tobacco abuse, rheumatoid arthritis, fibromyalgia, narcotic abuse, panic disorder presenting to the emergency department for right lower back pain. Patient describes this pain as waxing and waning in nature with intermittent sharp shooting pain without radiation. Patient states she has tried her at home muscle relaxants, heating pad, TENS machine without relief. Patient does not admit to any narcotic pain medication use for back pain. She denies any falls or injuries. She is adamant that this pain is different from chronic back pain. Endorses urinary frequency. Patient denies any fevers, chills, night sweats, new or changing numbness or weakness, bladder or bowel incontinence, history of IV drug use, history of cancer.   Past Medical History  Diagnosis Date  . ABDOMINAL WALL HERNIA 02/27/2010  . ALLERGIC RHINITIS 06/02/2007  . ANOREXIA, CHRONIC 08/07/2008  . ANXIETY 09/15/2010  . BACK PAIN, THORACIC REGION 07/07/2007  . CERVICAL RADICULOPATHY 12/11/2008  . Condyloma acuminatum 04/23/2009  . CONSTIPATION 09/15/2010  . DYSPHAGIA UNSPECIFIED 09/09/2009  . 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 ENDOMETRIOSIS 12/16/2009  . FATIGUE 06/11/2010  . GERD 10/23/2009  . HERPES SIMPLEX INFECTION 06/11/2010  . LENTIGO 04/23/2009  . LUNG NODULE 06/17/2010  . Palpitations 10/23/2009  . Stricture and stenosis of esophagus 05/13/2010  . TOBACCO ABUSE 08/07/2008  . Heart palpitations   . Urinary tract infection   . Rheumatoid arthritis(714.0)   . Ovarian cyst   . Abnormal Pap  smear   . Fibromyalgia   . Narcotic abuse, continuous   . Panic disorder    Past Surgical History  Procedure Laterality Date  . Partial hysterectomy    . Endometrial ablation    . Hernia repair      X3  . Dilation and curettage of uterus      x1   . Cesarean section      x2   . Esophageal dilatation x 4    . Gum surgery    . Abdominal surgery    . Abdominal wall mesh  removal     Family History  Problem Relation Age of Onset  . Depression Mother   . Arthritis Mother   . Hyperlipidemia Father   . Hypertension Father    History  Substance Use Topics  . Smoking status: Current Every Day Smoker -- 0.50 packs/day    Types: Cigarettes  . Smokeless tobacco: Never Used  . Alcohol Use: No   OB History   Grav Para Term Preterm Abortions TAB SAB Ect Mult Living   3 2 2  0 1 0 1 0 0 2     Review of Systems  Genitourinary: Positive for frequency.  Musculoskeletal: Positive for back pain.  All other systems reviewed and are negative.      Allergies  Lamictal; Nicotine; Zofran; Chantix; Codeine; Ibuprofen; Metaxalone; Morphine and related; Ondansetron; Skelaxin; Toradol; Varenicline; Amoxicillin; and Penicillins  Home Medications   Current Outpatient Rx  Name  Route  Sig  Dispense  Refill  . acetaminophen (TYLENOL) 325 MG tablet   Oral   Take 325 mg by mouth every 4 (four) hours as needed for pain.          Marland Kitchen  ALPRAZolam (XANAX) 1 MG tablet   Oral   Take 1 mg by mouth 4 (four) times daily as needed for anxiety.         Marland Kitchen escitalopram (LEXAPRO) 20 MG tablet   Oral   Take 20 mg by mouth daily.         . methocarbamol (ROBAXIN) 500 MG tablet   Oral   Take 500 mg by mouth 3 (three) times daily as needed for muscle spasms.          Marland Kitchen oxyCODONE (OXY IR/ROXICODONE) 5 MG immediate release tablet   Oral   Take 5 mg by mouth every 6 (six) hours as needed for severe pain.         . valACYclovir (VALTREX) 500 MG tablet   Oral   Take 500 mg by mouth daily as  needed. For outbreaks         . diazepam (VALIUM) 5 MG tablet   Oral   Take 1 tablet (5 mg total) by mouth 2 (two) times daily.   10 tablet   0   . lidocaine (LIDODERM) 5 %   Transdermal   Place 1 patch onto the skin daily. Remove & Discard patch within 12 hours or as directed by MD   6 patch   0   . predniSONE (DELTASONE) 20 MG tablet   Oral   Take 2 tablets (40 mg total) by mouth daily.   10 tablet   0    BP 128/95  Pulse 79  Temp(Src) 97.8 F (36.6 C) (Oral)  Resp 18  Ht 5\' 7"  (1.702 m)  Wt 150 lb (68.04 kg)  BMI 23.49 kg/m2  SpO2 97% Physical Exam  Constitutional: She is oriented to person, place, and time. She appears well-developed and well-nourished. No distress.  HENT:  Head: Normocephalic and atraumatic.  Right Ear: External ear normal.  Left Ear: External ear normal.  Nose: Nose normal.  Mouth/Throat: Oropharynx is clear and moist. No oropharyngeal exudate.  Eyes: Conjunctivae and EOM are normal. Pupils are equal, round, and reactive to light.  Neck: Normal range of motion. Neck supple.  Cardiovascular: Normal rate, regular rhythm, normal heart sounds and intact distal pulses.   Pulmonary/Chest: Effort normal and breath sounds normal. No respiratory distress.  Abdominal: Soft. There is no tenderness.  Musculoskeletal: Normal range of motion. She exhibits no edema.       Lumbar back: She exhibits tenderness. She exhibits normal range of motion, no bony tenderness, no deformity and no laceration.       Back:  Neurological: She is alert and oriented to person, place, and time. She has normal strength. No cranial nerve deficit or sensory deficit. Gait normal. GCS eye subscore is 4. GCS verbal subscore is 5. GCS motor subscore is 6.  No pronator drift. Bilateral heel-knee-shin intact.  Skin: Skin is warm and dry. She is not diaphoretic.    ED Course  Procedures (including critical care time) Medications  HYDROmorphone (DILAUDID) injection 1 mg (1 mg  Intramuscular Given 01/15/14 0515)  LORazepam (ATIVAN) injection 1 mg (1 mg Intramuscular Given 01/15/14 0515)    Labs Review Labs Reviewed  COMPREHENSIVE METABOLIC PANEL - Abnormal; Notable for the following:    Creatinine, Ser 0.49 (*)    Total Bilirubin <0.2 (*)    All other components within normal limits  URINALYSIS, ROUTINE W REFLEX MICROSCOPIC - Abnormal; Notable for the following:    Specific Gravity, Urine 1.004 (*)    All other components  within normal limits  CBC WITH DIFFERENTIAL  CBG MONITORING, ED   Imaging Review Dg Lumbar Spine Complete  01/15/2014   CLINICAL DATA:  Severe back pain for 1-2 days.  EXAM: LUMBAR SPINE - COMPLETE 4+ VIEW  COMPARISON:  MR lumbar spine 02/23/2013.  FINDINGS: There is no fracture or malalignment. Schmorl's node in the inferior endplate of L1 is unchanged. No pars interarticularis defect is identified. Intervertebral disc space height is maintained.  IMPRESSION: No acute finding.  Stable compared to prior exam.   Electronically Signed   By: Inge Rise M.D.   On: 01/15/2014 05:44     EKG Interpretation None      MDM   Final diagnoses:  Back pain    Filed Vitals:   01/15/14 0619  BP: 128/95  Pulse: 79  Temp: 97.8 F (36.6 C)  Resp: 18   Afebrile, NAD, non-toxic appearing, AAOx4.   Patient with back pain.  No neurological deficits and normal neuro exam. Lumbar x-ray negative. UA negative. Labs reviewed. Patient can walk but states is painful.  No loss of bowel or bladder control.  No concern for cauda equina.  No fever, night sweats, weight loss, h/o cancer, IVDU.  RICE protocol and pain medicine indicated and discussed with patient. Patient is stable at time of discharge      Harlow Mares, PA-C 01/15/14 4742

## 2014-01-15 NOTE — ED Notes (Signed)
PA at bedside.

## 2014-01-15 NOTE — ED Notes (Signed)
Pt report R side back pain that started yesterday. Pt pain has not been relieved by meds, TENS or heating pad. Pt reports an increase in frequency in urinating. Pt alert and ambulatory.

## 2014-01-18 NOTE — ED Provider Notes (Signed)
Medical screening examination/treatment/procedure(s) were performed by non-physician practitioner and as supervising physician I was immediately available for consultation/collaboration.   Babette Relic, MD 01/18/14 [phone]

## 2014-01-26 ENCOUNTER — Emergency Department (HOSPITAL_COMMUNITY)
Admission: EM | Admit: 2014-01-26 | Discharge: 2014-01-27 | Disposition: A | Discharge status: Home / Self Care | Payer: BC Managed Care – PPO | Attending: Emergency Medicine | Admitting: Emergency Medicine

## 2014-01-26 ENCOUNTER — Encounter (HOSPITAL_COMMUNITY): Payer: Self-pay | Admitting: Emergency Medicine

## 2014-01-26 DIAGNOSIS — IMO0002 Reserved for concepts with insufficient information to code with codable children: Secondary | ICD-10-CM | POA: Insufficient documentation

## 2014-01-26 DIAGNOSIS — Z79899 Other long term (current) drug therapy: Secondary | ICD-10-CM | POA: Insufficient documentation

## 2014-01-26 DIAGNOSIS — IMO0001 Reserved for inherently not codable concepts without codable children: Secondary | ICD-10-CM | POA: Insufficient documentation

## 2014-01-26 DIAGNOSIS — Z8619 Personal history of other infectious and parasitic diseases: Secondary | ICD-10-CM | POA: Insufficient documentation

## 2014-01-26 DIAGNOSIS — F17200 Nicotine dependence, unspecified, uncomplicated: Secondary | ICD-10-CM | POA: Insufficient documentation

## 2014-01-26 DIAGNOSIS — R079 Chest pain, unspecified: Secondary | ICD-10-CM

## 2014-01-26 DIAGNOSIS — Z8719 Personal history of other diseases of the digestive system: Secondary | ICD-10-CM | POA: Insufficient documentation

## 2014-01-26 DIAGNOSIS — R109 Unspecified abdominal pain: Secondary | ICD-10-CM | POA: Insufficient documentation

## 2014-01-26 DIAGNOSIS — M069 Rheumatoid arthritis, unspecified: Secondary | ICD-10-CM | POA: Insufficient documentation

## 2014-01-26 DIAGNOSIS — G8929 Other chronic pain: Secondary | ICD-10-CM | POA: Insufficient documentation

## 2014-01-26 DIAGNOSIS — F411 Generalized anxiety disorder: Secondary | ICD-10-CM | POA: Insufficient documentation

## 2014-01-26 DIAGNOSIS — F410 Panic disorder [episodic paroxysmal anxiety] without agoraphobia: Secondary | ICD-10-CM | POA: Insufficient documentation

## 2014-01-26 DIAGNOSIS — Z87440 Personal history of urinary (tract) infections: Secondary | ICD-10-CM | POA: Insufficient documentation

## 2014-01-26 DIAGNOSIS — Z880 Allergy status to penicillin: Secondary | ICD-10-CM | POA: Insufficient documentation

## 2014-01-26 DIAGNOSIS — Z8742 Personal history of other diseases of the female genital tract: Secondary | ICD-10-CM | POA: Insufficient documentation

## 2014-01-26 DIAGNOSIS — R0789 Other chest pain: Secondary | ICD-10-CM | POA: Insufficient documentation

## 2014-01-26 LAB — CBC
HCT: 45.4 % (ref 36.0–46.0)
HEMOGLOBIN: 16.4 g/dL — AB (ref 12.0–15.0)
MCH: 33.9 pg (ref 26.0–34.0)
MCHC: 36.1 g/dL — AB (ref 30.0–36.0)
MCV: 93.8 fL (ref 78.0–100.0)
Platelets: 189 10*3/uL (ref 150–400)
RBC: 4.84 MIL/uL (ref 3.87–5.11)
RDW: 12.1 % (ref 11.5–15.5)
WBC: 10.3 10*3/uL (ref 4.0–10.5)

## 2014-01-26 LAB — BASIC METABOLIC PANEL
BUN: 14 mg/dL (ref 6–23)
CALCIUM: 9.6 mg/dL (ref 8.4–10.5)
CO2: 24 mEq/L (ref 19–32)
CREATININE: 0.48 mg/dL — AB (ref 0.50–1.10)
Chloride: 99 mEq/L (ref 96–112)
GFR calc Af Amer: >90 mL/min (ref >90)
GLUCOSE: 76 mg/dL (ref 70–99)
Potassium: 3.9 mEq/L (ref 3.7–5.3)
Sodium: 138 mEq/L (ref 137–147)

## 2014-01-26 LAB — I-STAT TROPONIN, ED: Troponin i, poc: 0 ng/mL (ref 0.00–0.08)

## 2014-01-26 LAB — CBG MONITORING, ED: Glucose-Capillary: 74 mg/dL (ref 70–99)

## 2014-01-26 NOTE — ED Notes (Addendum)
Left arm tingling, and radiating sharp pain, followed by lt. Sided cp. Hx. Of anxiety. Some sob. According to family member, pt. Under much stress. Pt. States, "just tired."

## 2014-01-27 LAB — I-STAT TROPONIN, ED: Troponin i, poc: 0 ng/mL (ref 0.00–0.08)

## 2014-01-27 MED ORDER — HYDROCODONE-ACETAMINOPHEN 5-325 MG PO TABS
2.0000 | ORAL_TABLET | Freq: Once | ORAL | Status: DC
  Filled 2014-01-27: qty 2
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
## 2014-01-27 NOTE — ED Notes (Addendum)
Pt. And family stated MD told them that Pt. Could take her glucose med, pain med and xanax. Pt. States she took xanax and oxycodone.

## 2014-01-27 NOTE — Discharge Instructions (Signed)
If you were given medicines take as directed.  If you are on coumadin or contraceptives realize their levels and effectiveness is altered by many different medicines.  If you have any reaction (rash, tongues swelling, other) to the medicines stop taking and see a physician.   Please follow up as directed and return to the ER or see a physician for new or worsening symptoms.  Thank you. Filed Vitals:   01/26/14 2317 01/27/14 0015 01/27/14 0030 01/27/14 0045  BP: 99/52 96/61 98/59  96/68  Pulse:  75 66 78  Temp:      TempSrc:      Resp: 14 14 20 11   Height:      Weight:      SpO2:  100% 100% 100%    Chest Pain (Nonspecific) It is often hard to give a specific diagnosis for the cause of chest pain. There is always a chance that your pain could be related to something serious, such as a heart attack or a blood clot in the lungs. You need to follow up with your caregiver for further evaluation. CAUSES   Heartburn.  Pneumonia or bronchitis.  Anxiety or stress.  Inflammation around your heart (pericarditis) or lung (pleuritis or pleurisy).  A blood clot in the lung.  A collapsed lung (pneumothorax). It can develop suddenly on its own (spontaneous pneumothorax) or from injury (trauma) to the chest.  Shingles infection (herpes zoster virus). The chest wall is composed of bones, muscles, and cartilage. Any of these can be the source of the pain.  The bones can be bruised by injury.  The muscles or cartilage can be strained by coughing or overwork.  The cartilage can be affected by inflammation and become sore (costochondritis). DIAGNOSIS  Lab tests or other studies, such as X-rays, electrocardiography, stress testing, or cardiac imaging, may be needed to find the cause of your pain.  TREATMENT   Treatment depends on what may be causing your chest pain. Treatment may include:  Acid blockers for heartburn.  Anti-inflammatory medicine.  Pain medicine for inflammatory  conditions.  Antibiotics if an infection is present.  You may be advised to change lifestyle habits. This includes stopping smoking and avoiding alcohol, caffeine, and chocolate.  You may be advised to keep your head raised (elevated) when sleeping. This reduces the chance of acid going backward from your stomach into your esophagus.  Most of the time, nonspecific chest pain will improve within 2 to 3 days with rest and mild pain medicine. HOME CARE INSTRUCTIONS   If antibiotics were prescribed, take your antibiotics as directed. Finish them even if you start to feel better.  For the next few days, avoid physical activities that bring on chest pain. Continue physical activities as directed.  Do not smoke.  Avoid drinking alcohol.  Only take over-the-counter or prescription medicine for pain, discomfort, or fever as directed by your caregiver.  Follow your caregiver's suggestions for further testing if your chest pain does not go away.  Keep any follow-up appointments you made. If you do not go to an appointment, you could develop lasting (chronic) problems with pain. If there is any problem keeping an appointment, you must call to reschedule. SEEK MEDICAL CARE IF:   You think you are having problems from the medicine you are taking. Read your medicine instructions carefully.  Your chest pain does not go away, even after treatment.  You develop a rash with blisters on your chest. SEEK IMMEDIATE MEDICAL CARE IF:   You  have increased chest pain or pain that spreads to your arm, neck, jaw, back, or abdomen.  You develop shortness of breath, an increasing cough, or you are coughing up blood.  You have severe back or abdominal pain, feel nauseous, or vomit.  You develop severe weakness, fainting, or chills.  You have a fever. THIS IS AN EMERGENCY. Do not wait to see if the pain will go away. Get medical help at once. Call your local emergency services (911 in [place] Do not drive  yourself to the hospital. MAKE SURE YOU:   Understand these instructions.  Will watch your condition.  Will get help right away if you are not doing well or get worse. Document Released: 07/28/2005 Document Revised: 01/10/2012 Document Reviewed: 05/23/2008 [facility] Patient Information 2014 [place].

## 2014-03-26 ENCOUNTER — Encounter (HOSPITAL_COMMUNITY): Payer: Self-pay | Admitting: Emergency Medicine

## 2014-03-26 ENCOUNTER — Emergency Department (HOSPITAL_COMMUNITY)
Admission: EM | Admit: 2014-03-26 | Discharge: 2014-03-26 | Disposition: A | Discharge status: Home / Self Care | Payer: BC Managed Care – PPO | Attending: Emergency Medicine | Admitting: Emergency Medicine

## 2014-03-26 DIAGNOSIS — Z8709 Personal history of other diseases of the respiratory system: Secondary | ICD-10-CM | POA: Insufficient documentation

## 2014-03-26 DIAGNOSIS — F17200 Nicotine dependence, unspecified, uncomplicated: Secondary | ICD-10-CM | POA: Insufficient documentation

## 2014-03-26 DIAGNOSIS — F410 Panic disorder [episodic paroxysmal anxiety] without agoraphobia: Secondary | ICD-10-CM | POA: Insufficient documentation

## 2014-03-26 DIAGNOSIS — Z87440 Personal history of urinary (tract) infections: Secondary | ICD-10-CM | POA: Insufficient documentation

## 2014-03-26 DIAGNOSIS — F411 Generalized anxiety disorder: Secondary | ICD-10-CM | POA: Insufficient documentation

## 2014-03-26 DIAGNOSIS — Z8719 Personal history of other diseases of the digestive system: Secondary | ICD-10-CM | POA: Insufficient documentation

## 2014-03-26 DIAGNOSIS — M5430 Sciatica, unspecified side: Secondary | ICD-10-CM | POA: Insufficient documentation

## 2014-03-26 DIAGNOSIS — R609 Edema, unspecified: Secondary | ICD-10-CM | POA: Insufficient documentation

## 2014-03-26 DIAGNOSIS — R52 Pain, unspecified: Secondary | ICD-10-CM | POA: Insufficient documentation

## 2014-03-26 DIAGNOSIS — R635 Abnormal weight gain: Secondary | ICD-10-CM | POA: Insufficient documentation

## 2014-03-26 DIAGNOSIS — Z8739 Personal history of other diseases of the musculoskeletal system and connective tissue: Secondary | ICD-10-CM | POA: Insufficient documentation

## 2014-03-26 DIAGNOSIS — F419 Anxiety disorder, unspecified: Secondary | ICD-10-CM

## 2014-03-26 DIAGNOSIS — Z79899 Other long term (current) drug therapy: Secondary | ICD-10-CM | POA: Insufficient documentation

## 2014-03-26 DIAGNOSIS — Z880 Allergy status to penicillin: Secondary | ICD-10-CM | POA: Insufficient documentation

## 2014-03-26 DIAGNOSIS — Z8742 Personal history of other diseases of the female genital tract: Secondary | ICD-10-CM | POA: Insufficient documentation

## 2014-03-26 DIAGNOSIS — Z8619 Personal history of other infectious and parasitic diseases: Secondary | ICD-10-CM | POA: Insufficient documentation

## 2014-03-26 DIAGNOSIS — Z872 Personal history of diseases of the skin and subcutaneous tissue: Secondary | ICD-10-CM | POA: Insufficient documentation

## 2014-03-26 LAB — COMPREHENSIVE METABOLIC PANEL
ALT: 12 U/L (ref 0–35)
AST: 13 U/L (ref 0–37)
Albumin: 3.6 g/dL (ref 3.5–5.2)
Alkaline Phosphatase: 78 U/L (ref 39–117)
BUN: 11 mg/dL (ref 6–23)
CO2: 27 mEq/L (ref 19–32)
Calcium: 8.7 mg/dL (ref 8.4–10.5)
Chloride: 104 mEq/L (ref 96–112)
Creatinine, Ser: 0.5 mg/dL (ref 0.50–1.10)
GFR calc Af Amer: >90 mL/min (ref >90)
GFR calc non Af Amer: >90 mL/min (ref >90)
Glucose, Bld: 86 mg/dL (ref 70–99)
Potassium: 4.1 mEq/L (ref 3.7–5.3)
Sodium: 141 mEq/L (ref 137–147)
Total Bilirubin: 0.3 mg/dL (ref 0.3–1.2)
Total Protein: 6.3 g/dL (ref 6.0–8.3)

## 2014-03-26 LAB — URINALYSIS, ROUTINE W REFLEX MICROSCOPIC
Bilirubin Urine: NEGATIVE
Glucose, UA: NEGATIVE mg/dL
Hgb urine dipstick: NEGATIVE
Ketones, ur: NEGATIVE mg/dL
Leukocytes, UA: NEGATIVE
Nitrite: NEGATIVE
Protein, ur: NEGATIVE mg/dL
Specific Gravity, Urine: 1.008 (ref 1.005–1.030)
Urobilinogen, UA: 0.2 mg/dL (ref 0.0–1.0)
pH: 7 (ref 5.0–8.0)

## 2014-03-26 LAB — CBC WITH DIFFERENTIAL/PLATELET
Basophils Absolute: 0 10*3/uL (ref 0.0–0.1)
Basophils Relative: 0 % (ref 0–1)
Eosinophils Absolute: 0.3 10*3/uL (ref 0.0–0.7)
Eosinophils Relative: 4 % (ref 0–5)
HCT: 39.2 % (ref 36.0–46.0)
Hemoglobin: 13.8 g/dL (ref 12.0–15.0)
Lymphocytes Relative: 40 % (ref 12–46)
Lymphs Abs: 2.7 10*3/uL (ref 0.7–4.0)
MCH: 32.5 pg (ref 26.0–34.0)
MCHC: 35.2 g/dL (ref 30.0–36.0)
MCV: 92.5 fL (ref 78.0–100.0)
Monocytes Absolute: 0.4 10*3/uL (ref 0.1–1.0)
Monocytes Relative: 6 % (ref 3–12)
Neutro Abs: 3.4 10*3/uL (ref 1.7–7.7)
Neutrophils Relative %: 50 % (ref 43–77)
Platelets: 204 10*3/uL (ref 150–400)
RBC: 4.24 MIL/uL (ref 3.87–5.11)
RDW: 11.7 % (ref 11.5–15.5)
WBC: 6.7 10*3/uL (ref 4.0–10.5)

## 2014-03-26 LAB — CK: Total CK: 76 U/L (ref 7–177)

## 2014-03-26 MED ORDER — HYDROMORPHONE HCL PF 1 MG/ML IJ SOLN
1.0000 mg | Freq: Once | INTRAMUSCULAR | Status: AC
  Administered 2014-03-26: 1 mg via INTRAVENOUS
  Filled 2014-03-26: qty 1

## 2014-03-26 MED ORDER — FENTANYL CITRATE 0.05 MG/ML IJ SOLN
100.0000 ug | Freq: Once | INTRAMUSCULAR | Status: AC
  Administered 2014-03-26: 100 ug via INTRAVENOUS
  Filled 2014-03-26: qty 2

## 2014-03-26 MED ORDER — SODIUM CHLORIDE 0.9 % IV BOLUS (SEPSIS): 1000.0000 mL | Freq: Once | INTRAVENOUS | Status: DC

## 2014-03-26 MED ORDER — SODIUM CHLORIDE 0.9 % IV BOLUS (SEPSIS)
1000.0000 mL | Freq: Once | INTRAVENOUS | Status: AC
  Administered 2014-03-26: 1000 mL via INTRAVENOUS

## 2014-03-26 MED ORDER — LORAZEPAM 1 MG PO TABS
1.0000 mg | ORAL_TABLET | Freq: Once | ORAL | Status: AC
  Administered 2014-03-26: 1 mg via ORAL
  Filled 2014-03-26: qty 1

## 2014-03-26 MED ORDER — PREDNISONE 50 MG PO TABS: 50.0000 mg | ORAL_TABLET | Freq: Every day | ORAL | Status: DC

## 2014-03-26 MED ORDER — PROMETHAZINE HCL 25 MG/ML IJ SOLN
12.5000 mg | Freq: Once | INTRAMUSCULAR | Status: AC
  Administered 2014-03-26: 12.5 mg via INTRAVENOUS
  Filled 2014-03-26: qty 1

## 2014-03-26 NOTE — ED Provider Notes (Signed)
CSN: [id number]     Arrival date & time 03/26/14  1503 History   First MD Initiated Contact with Patient 03/26/14 1515     Chief Complaint  Patient presents with  . Dizziness  . Edema  . Muscle Pain     (Consider location/radiation/quality/duration/timing/severity/associated sxs/prior Treatment) HPI Patient presents to the emergency department with generalized pain, dizziness, and weight gain over the last several months.  The patient, states, that the dizziness as been ongoing for 3 weeks.  Patient, states, that she's had increasing stress and anxiety since her daughter attempted suicide several months ago.  Patient, states, that she had her antidepressant medications, increased, and add Ambien to help with sleep.  Patient, states, that nothing seems to make her condition, better.  The patient, states she didn't injure her back several days, ago attempting to help her mother use the bathroom.  The patient is here with multiple complaints.  The patient, states, that she does not have any chest pain, shortness of breath, nausea, vomiting, diarrhea, weakness, dizziness, headache, blurred vision, fever, cough, runny nose, sore throat, vaginal discharge, vaginal bleeding, rash, increased thirst, decreased appetite, or syncope.  The patient, states she only took her prescribed medications prior to arrival Past Medical History  Diagnosis Date  . ABDOMINAL WALL HERNIA 02/27/2010  . ALLERGIC RHINITIS 06/02/2007  . ANOREXIA, CHRONIC 08/07/2008  . ANXIETY 09/15/2010  . BACK PAIN, THORACIC REGION 07/07/2007  . CERVICAL RADICULOPATHY 12/11/2008  . Condyloma acuminatum 04/23/2009  . CONSTIPATION 09/15/2010  . DYSPHAGIA UNSPECIFIED 09/09/2009  . 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 ENDOMETRIOSIS 12/16/2009  . FATIGUE 06/11/2010  . GERD 10/23/2009  . HERPES SIMPLEX INFECTION 06/11/2010  . LENTIGO 04/23/2009  . LUNG NODULE 06/17/2010  . Palpitations 10/23/2009  . Stricture and stenosis of esophagus 05/13/2010  .  TOBACCO ABUSE 08/07/2008  . Heart palpitations   . Urinary tract infection   . Rheumatoid arthritis(714.0)   . Ovarian cyst   . Abnormal Pap smear   . Fibromyalgia   . Narcotic abuse, continuous   . Panic disorder    Past Surgical History  Procedure Laterality Date  . Partial hysterectomy    . Endometrial ablation    . Hernia repair      X3  . Dilation and curettage of uterus      x1   . Cesarean section      x2   . Esophageal dilatation x 4    . Gum surgery    . Abdominal surgery    . Abdominal wall mesh  removal     Family History  Problem Relation Age of Onset  . Depression Mother   . Arthritis Mother   . Hyperlipidemia Father   . Hypertension Father    History  Substance Use Topics  . Smoking status: Current Every Day Smoker -- 0.50 packs/day    Types: Cigarettes  . Smokeless tobacco: Never Used  . Alcohol Use: No   OB History   Grav Para Term Preterm Abortions TAB SAB Ect Mult Living   3 2 2  0 1 0 1 0 0 2     Review of Systems All other systems negative except as documented in the HPI. All pertinent positives and negatives as reviewed in the HPI.    Allergies  Lamictal; Nicotine; Zofran; Chantix; Codeine; Ibuprofen; Metaxalone; Morphine and related; Ondansetron; Skelaxin; Toradol; Varenicline; Amoxicillin; and Penicillins  Home Medications   Prior to Admission medications   Medication Sig Start Date End Date Taking? Authorizing  Provider  acetaminophen (TYLENOL) 325 MG tablet Take 325 mg by mouth every 4 (four) hours as needed for pain.    Yes Historical Provider, MD  ALPRAZolam Duanne Moron) 1 MG tablet Take 1 mg by mouth 4 (four) times daily as needed for anxiety.   Yes Historical Provider, MD  escitalopram (LEXAPRO) 20 MG tablet Take 30 mg by mouth daily.    Yes Historical Provider, MD  methocarbamol (ROBAXIN) 500 MG tablet Take 500 mg by mouth 3 (three) times daily as needed for muscle spasms.  05/01/13  Yes [place], PA-C  oxyCODONE-acetaminophen  (PERCOCET) 10-325 MG per tablet Take 1 tablet by mouth every 6 (six) hours as needed for pain.   Yes Historical Provider, MD  zolpidem (AMBIEN) 5 MG tablet Take 5 mg by mouth at bedtime as needed for sleep.   Yes Historical Provider, MD   BP 100/72  Pulse 74  Temp(Src) 98.4 F (36.9 C) (Oral)  Resp 16  SpO2 100% Physical Exam  Constitutional: She is oriented to person, place, and time. She appears well-developed and well-nourished. No distress.  HENT:  Head: Normocephalic and atraumatic.  Mouth/Throat: Oropharynx is clear and moist.  Eyes: Pupils are equal, round, and reactive to light.  Neck: Normal range of motion. Neck supple.  Cardiovascular: Normal rate, regular rhythm and normal heart sounds.  Exam reveals no gallop and no friction rub.   No murmur heard. Pulmonary/Chest: Effort normal and breath sounds normal. No respiratory distress.  Musculoskeletal:       Lumbar back: She exhibits tenderness and pain. She exhibits normal range of motion, no bony tenderness, no swelling, no deformity, no laceration and no spasm.       Back:  Neurological: She is alert and oriented to person, place, and time. She has normal reflexes. She exhibits normal muscle tone. Coordination normal.  Skin: Skin is warm and dry. No rash noted. No erythema.    ED Course  Procedures (including critical care time) Labs Review Labs Reviewed  URINE CULTURE  CBC WITH DIFFERENTIAL  COMPREHENSIVE METABOLIC PANEL  URINALYSIS, ROUTINE W REFLEX MICROSCOPIC  CK    Imaging Review No results found.   EKG Interpretation   Date/Time:  Tuesday Mar 26 2014 15:21:29 EDT Ventricular Rate:  63 PR Interval:  132 QRS Duration: 79 QT Interval:  411 QTC Calculation: 421 R Axis:   47 Text Interpretation:  Sinus rhythm Abnormal inferior Q waves No  significant change since last tracing Confirmed by Alvino Chapel  MD, Ovid Curd  [phone]) on 03/26/2014 3:30:51 PM      I advised the patient, that she'll need followup and  further evaluation by her primary care doctor.  She states that her primary care Dr. he is leaving town.  Also advised her to follow with her GYN doctor as scheduled tomorrow.  The patient is advised to return here as needed.  The patient has a multitude of complaints I feel all stem from significant depression and anxiety.  The patient is advised to return here as needed.  She does not have any neurological deficits noted on exam, and she is able to ambulate without difficulty   Brent General, PA-C 03/26/14 1938

## 2014-03-26 NOTE — ED Notes (Signed)
Per EMS- for 3 weeks pt has had dizziness only when sitting, breasts are larger, sharp pain in incision sites in abdomen (surgery x2 years ago), has gained 15 lbs in two months, generalized weakness, increased muscle aches, generalized cramping, "swelling in body" by end of the day, and knot on right FA. Increased pt's antidepressant 2 weeks and added Ambien 2 weeks ago. VS BP 106/60 HR 80 RR 16 CBG 77. Pt is a college professor. Has been hospitalized before and was classified as "having anxiety attacks"-per patient. Hx hysterectomy.

## 2014-03-26 NOTE — ED Notes (Signed)
Bed: WA17 Expected date:  Expected time:  Means of arrival:  Comments: EMS-Dizzy

## 2014-03-26 NOTE — ED Notes (Addendum)
Per patient-A&O x4. Daughter tried to kill herself 12/21/2013 and "got through that." pt was recently diagnosed with hypoglycemia. Is having to eat carbs and weight gain started then. Mom was recently diagnosed with CA and pt has felt more anxious since then. Reports "my shrink increased my anti-depressants 2-3 weeks ago." Says she can't walk anymore like she used to because of the stabbing back pain. Symptoms reported by patient: muscle aches, stabbing back pain, muscle cramping that never goes away, swelling, dizziness during sitting (but not all the time). Does report choking and has hx esophageal rings but has surgery scheduled for this. Hx fibromyalgia. Also isn't able to have bowel movements on her own. Uses enemas at home.

## 2014-03-26 NOTE — Discharge Instructions (Signed)
Return here as needed.  Followup with your primary care Dr. and GYN doctor.  Use ice and heat on your lower back

## 2014-03-27 LAB — URINE CULTURE
Colony Count: NO GROWTH
Culture: NO GROWTH

## 2014-03-27 NOTE — ED Provider Notes (Signed)
Medical screening examination/treatment/procedure(s) were performed by non-physician practitioner and as supervising physician I was immediately available for consultation/collaboration.   EKG Interpretation   Date/Time:  Tuesday Mar 26 2014 15:21:29 EDT Ventricular Rate:  63 PR Interval:  132 QRS Duration: 79 QT Interval:  411 QTC Calculation: 421 R Axis:   47 Text Interpretation:  Sinus rhythm Abnormal inferior Q waves No  significant change since last tracing Confirmed by Alvino Chapel  MD, Richel Millspaugh  [phone]) on 03/26/2014 3:30:51 PM       Jasper Riling. Alvino Chapel, MD 03/27/14 0001

## 2014-04-27 ENCOUNTER — Emergency Department (HOSPITAL_COMMUNITY)
Admission: EM | Admit: 2014-04-27 | Discharge: 2014-04-27 | Disposition: A | Discharge status: Home / Self Care | Payer: BC Managed Care – PPO | Attending: Emergency Medicine | Admitting: Emergency Medicine

## 2014-04-27 ENCOUNTER — Encounter (HOSPITAL_COMMUNITY): Payer: Self-pay | Admitting: Emergency Medicine

## 2014-04-27 DIAGNOSIS — Z872 Personal history of diseases of the skin and subcutaneous tissue: Secondary | ICD-10-CM | POA: Insufficient documentation

## 2014-04-27 DIAGNOSIS — G8929 Other chronic pain: Secondary | ICD-10-CM | POA: Insufficient documentation

## 2014-04-27 DIAGNOSIS — Z87440 Personal history of urinary (tract) infections: Secondary | ICD-10-CM | POA: Insufficient documentation

## 2014-04-27 DIAGNOSIS — IMO0001 Reserved for inherently not codable concepts without codable children: Secondary | ICD-10-CM | POA: Insufficient documentation

## 2014-04-27 DIAGNOSIS — Z90710 Acquired absence of both cervix and uterus: Secondary | ICD-10-CM | POA: Insufficient documentation

## 2014-04-27 DIAGNOSIS — R42 Dizziness and giddiness: Secondary | ICD-10-CM | POA: Insufficient documentation

## 2014-04-27 DIAGNOSIS — Z79899 Other long term (current) drug therapy: Secondary | ICD-10-CM | POA: Insufficient documentation

## 2014-04-27 DIAGNOSIS — R5383 Other fatigue: Secondary | ICD-10-CM

## 2014-04-27 DIAGNOSIS — Z8719 Personal history of other diseases of the digestive system: Secondary | ICD-10-CM | POA: Insufficient documentation

## 2014-04-27 DIAGNOSIS — Z9089 Acquired absence of other organs: Secondary | ICD-10-CM | POA: Insufficient documentation

## 2014-04-27 DIAGNOSIS — Z8709 Personal history of other diseases of the respiratory system: Secondary | ICD-10-CM | POA: Insufficient documentation

## 2014-04-27 DIAGNOSIS — F341 Dysthymic disorder: Secondary | ICD-10-CM | POA: Insufficient documentation

## 2014-04-27 DIAGNOSIS — R55 Syncope and collapse: Secondary | ICD-10-CM | POA: Insufficient documentation

## 2014-04-27 DIAGNOSIS — Z8742 Personal history of other diseases of the female genital tract: Secondary | ICD-10-CM | POA: Insufficient documentation

## 2014-04-27 DIAGNOSIS — Z880 Allergy status to penicillin: Secondary | ICD-10-CM | POA: Insufficient documentation

## 2014-04-27 DIAGNOSIS — Z8619 Personal history of other infectious and parasitic diseases: Secondary | ICD-10-CM | POA: Insufficient documentation

## 2014-04-27 DIAGNOSIS — F17200 Nicotine dependence, unspecified, uncomplicated: Secondary | ICD-10-CM | POA: Insufficient documentation

## 2014-04-27 DIAGNOSIS — R110 Nausea: Secondary | ICD-10-CM | POA: Insufficient documentation

## 2014-04-27 DIAGNOSIS — Z9889 Other specified postprocedural states: Secondary | ICD-10-CM | POA: Insufficient documentation

## 2014-04-27 DIAGNOSIS — R5381 Other malaise: Secondary | ICD-10-CM | POA: Insufficient documentation

## 2014-04-27 DIAGNOSIS — M549 Dorsalgia, unspecified: Secondary | ICD-10-CM | POA: Insufficient documentation

## 2014-04-27 DIAGNOSIS — M069 Rheumatoid arthritis, unspecified: Secondary | ICD-10-CM | POA: Insufficient documentation

## 2014-04-27 LAB — CBG MONITORING, ED: Glucose-Capillary: 99 mg/dL (ref 70–99)

## 2014-04-27 MED ORDER — PROMETHAZINE HCL 25 MG/ML IJ SOLN
12.5000 mg | Freq: Once | INTRAMUSCULAR | Status: AC
  Administered 2014-04-27: 12.5 mg via INTRAVENOUS
  Filled 2014-04-27: qty 1

## 2014-04-27 NOTE — ED Notes (Signed)
Pt in from home by ems. Told ems she felt "faint" and nauseous, a general feeling of unwell. Upon arrival to ED, pt told NT she "passed out." EMS BP: 114/70, p62, r16, cg 87.

## 2014-04-27 NOTE — ED Notes (Signed)
Pt left prior to receiving discharge instructions.

## 2014-04-27 NOTE — ED Notes (Signed)
Bed: [facility] Expected date:  Expected time:  Means of arrival:  Comments: EMSyo generalized feeling of unwell, faint, nausea

## 2014-04-27 NOTE — Discharge Instructions (Signed)
Vasovagal Syncope, Adult °Syncope, commonly known as fainting, is a temporary loss of consciousness. It occurs when the blood flow to the brain is reduced. Vasovagal syncope (also called neurocardiogenic syncope) is a fainting spell in which the blood flow to the brain is reduced because of a sudden drop in heart rate and blood pressure. Vasovagal syncope occurs when the brain and the cardiovascular system (blood vessels) do not adequately communicate and respond to each other. This is the most common cause of fainting. It often occurs in response to fear or some other type of emotional or physical stress. The body has a reaction in which the heart starts beating too slowly or the blood vessels expand, reducing blood pressure. This type of fainting spell is generally considered harmless. However, injuries can occur if a person takes a sudden fall during a fainting spell.  °CAUSES  °Vasovagal syncope occurs when a person's blood pressure and heart rate decrease suddenly, usually in response to a trigger. Many things and situations can trigger an episode. Some of these include:  °· Pain.   °· Fear.   °· The sight of blood or medical procedures, such as blood being drawn from a vein.   °· Common activities, such as coughing, swallowing, stretching, or going to the bathroom.   °· Emotional stress.   °· Prolonged standing, especially in a warm environment.   °· Lack of sleep or rest.   °· Prolonged lack of food.   °· Prolonged lack of fluids.   °· Recent illness. °· The use of certain drugs that affect blood pressure, such as cocaine, alcohol, marijuana, inhalants, and opiates.   °SYMPTOMS  °Before the fainting episode, you may:  °· Feel dizzy or light headed.   °· Become pale. °· Sense that you are going to faint.   °· Feel like the room is spinning.   °· Have tunnel vision, only seeing directly in front of you.   °· Feel sick to your stomach (nauseous).   °· See spots or slowly lose vision.   °· Hear ringing in your  ears.   °· Have a headache.   °· Feel warm and sweaty.   °· Feel a sensation of pins and needles. °During the fainting spell, you will generally be unconscious for no longer than a couple minutes before waking up and returning to normal. If you get up too quickly before your body can recover, you may faint again. Some twitching or jerky movements may occur during the fainting spell.  °DIAGNOSIS  °Your caregiver will ask about your symptoms, take a medical history, and perform a physical exam. Various tests may be done to rule out other causes of fainting. These may include blood tests and tests to check the heart, such as electrocardiography, echocardiography, and possibly an electrophysiology study. When other causes have been ruled out, a test may be done to check the body's response to changes in position (tilt table test). °TREATMENT  °Most cases of vasovagal syncope do not require treatment. Your caregiver may recommend ways to avoid fainting triggers and may provide home strategies for preventing fainting. If you must be exposed to a possible trigger, you can drink additional fluids to help reduce your chances of having an episode of vasovagal syncope. If you have warning signs of an oncoming episode, you can respond by positioning yourself favorably (lying down). °If your fainting spells continue, you may be given medicines to prevent fainting. Some medicines may help make you more resistant to repeated episodes of vasovagal syncope. Special exercises or compression stockings may be recommended. In rare cases, the surgical placement   of a pacemaker is considered. °HOME CARE INSTRUCTIONS  °· Learn to identify the warning signs of vasovagal syncope.   °· Sit or lie down at the first warning sign of a fainting spell. If sitting, put your head down between your legs. If you lie down, swing your legs up in the air to increase blood flow to the brain.   °· Avoid hot tubs and saunas. °· Avoid prolonged  standing. °· Drink enough fluids to keep your urine clear or pale yellow. Avoid caffeine. °· Increase salt in your diet as directed by your caregiver.   °· If you have to stand for a long time, perform movements such as:   °¨ Crossing your legs.   °¨ Flexing and stretching your leg muscles.   °¨ Squatting.   °¨ Moving your legs.   °¨ Bending over.   °· Only take over-the-counter or prescription medicines as directed by your caregiver. Do not suddenly stop any medicines without asking your caregiver first.  °SEEK MEDICAL CARE IF:  °· Your fainting spells continue or happen more frequently in spite of treatment.   °· You lose consciousness for more than a couple minutes. °· You have fainting spells during or after exercising or after being startled.   °· You have new symptoms that occur with the fainting spells, such as:   °¨ Shortness of breath. °¨ Chest pain.   °¨ Irregular heartbeat.   °· You have episodes of twitching or jerky movements that last longer than a few seconds. °· You have episodes of twitching or jerky movements without obvious fainting. °SEEK IMMEDIATE MEDICAL CARE IF:  °· You have injuries or bleeding after a fainting spell.   °· You have episodes of twitching or jerky movements that last longer than 5 minutes.   °· You have more than one spell of twitching or jerky movements before returning to consciousness after fainting. °MAKE SURE YOU:  °· Understand these instructions. °· Will watch your condition. °· Will get help right away if you are not doing well or get worse. °Document Released: 10/04/2012 Document Reviewed: 10/04/2012 °[employer]® Patient Information ©2015 [employer]. This information is not intended to replace advice given to you by your health care provider. Make sure you discuss any questions you have with your health care provider. ° °

## 2014-05-04 NOTE — ED Provider Notes (Signed)
CSN: [id number]     Arrival date & time 04/27/14  0241 History   First MD Initiated Contact with Patient 04/27/14 0403     Chief Complaint  Patient presents with  . Loss of Consciousness  . Nausea    (Consider location/radiation/quality/duration/timing/severity/associated sxs/prior Treatment) HPI Comments: Patient is a 38 year old female with a history of chronic back pain, fibromyalgia, and panic disorder who presents to the emergency department for sensation of feeling "faint" and nauseous. Patient called EMS for these reasons and also endorsed a general feeling of unwell. Patient denied syncope to EMS. Upon arrival to the ED, however, patient told nursing tech that she "passed out". Patient is unable to expand on the specifics of this event. 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  Patient is a 38 y.o. female presenting with syncope. The history is provided by the patient. No language interpreter was used.  Loss of Consciousness Associated symptoms: nausea and weakness (generalized)   Associated symptoms: no chest pain and no shortness of breath     Past Medical History  Diagnosis Date  . ABDOMINAL WALL HERNIA 02/27/2010  . ALLERGIC RHINITIS 06/02/2007  . ANOREXIA, CHRONIC 08/07/2008  . ANXIETY 09/15/2010  . BACK PAIN, THORACIC REGION 07/07/2007  . CERVICAL RADICULOPATHY 12/11/2008  . Condyloma acuminatum 04/23/2009  . CONSTIPATION 09/15/2010  . DYSPHAGIA UNSPECIFIED 09/09/2009  . 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 ENDOMETRIOSIS 12/16/2009  . FATIGUE 06/11/2010  . GERD 10/23/2009  . HERPES SIMPLEX INFECTION 06/11/2010  . LENTIGO 04/23/2009  . LUNG NODULE 06/17/2010  . Palpitations 10/23/2009  . Stricture and stenosis of esophagus 05/13/2010  . TOBACCO ABUSE 08/07/2008  . Heart palpitations   . Urinary tract infection   . Rheumatoid arthritis(714.0)   . Ovarian cyst   . Abnormal Pap smear   . Fibromyalgia   . Narcotic abuse, continuous    . Panic disorder    Past Surgical History  Procedure Laterality Date  . Partial hysterectomy    . Endometrial ablation    . Hernia repair      X3  . Dilation and curettage of uterus      x1   . Cesarean section      x2   . Esophageal dilatation x 4    . Gum surgery    . Abdominal surgery    . Abdominal wall mesh  removal     Family History  Problem Relation Age of Onset  . Depression Mother   . Arthritis Mother   . Hyperlipidemia Father   . Hypertension Father    History  Substance Use Topics  . Smoking status: Current Every Day Smoker -- 0.50 packs/day    Types: Cigarettes  . Smokeless tobacco: Never Used  . Alcohol Use: No   OB History   Grav Para Term Preterm Abortions TAB SAB Ect Mult Living   3 2 2  0 1 0 1 0 0 2      Review of Systems  Constitutional: Positive for fatigue.  Respiratory: Negative for shortness of breath.   Cardiovascular: Positive for syncope. Negative for chest pain.  Gastrointestinal: Positive for nausea.  Genitourinary:       No bowel/bladder incontinence  Musculoskeletal: Positive for back pain (chronic).  Neurological: Positive for weakness (generalized) and light-headedness. Negative for facial asymmetry and numbness.  All other systems reviewed and are negative.    Allergies  Lamictal; Nicotine; Zofran; Chantix; Codeine; Ibuprofen; Metaxalone; Morphine and related; Ondansetron; Skelaxin;  Toradol; Varenicline; Amoxicillin; and Penicillins  Home Medications   Prior to Admission medications   Medication Sig Start Date End Date Taking? Authorizing Provider  acetaminophen (TYLENOL) 325 MG tablet Take 325 mg by mouth every 4 (four) hours as needed for pain.    Yes Historical Provider, MD  ALPRAZolam Duanne Moron) 1 MG tablet Take 1 mg by mouth 4 (four) times daily as needed for anxiety.   Yes Historical Provider, MD  escitalopram (LEXAPRO) 20 MG tablet Take 30 mg by mouth daily.    Yes Historical Provider, MD  methocarbamol (ROBAXIN) 500  MG tablet Take 500 mg by mouth 3 (three) times daily as needed for muscle spasms.  05/01/13  Yes [place], PA-C  oxyCODONE-acetaminophen (PERCOCET) 10-325 MG per tablet Take 1 tablet by mouth every 6 (six) hours as needed for pain.   Yes Historical Provider, MD  zolpidem (AMBIEN) 5 MG tablet Take 5 mg by mouth at bedtime as needed for sleep.   Yes Historical Provider, MD   BP 98/62  Pulse 70  Temp(Src) 97.1 F (36.2 C) (Oral)  Resp 18  SpO2 100%  Physical Exam  Nursing note and vitals reviewed. Constitutional: She is oriented to person, place, and time. She appears well-developed and well-nourished. No distress.  Nontoxic/nonseptic appearing.  HENT:  Head: Normocephalic and atraumatic.  Mouth/Throat: Oropharynx is clear and moist. No oropharyngeal exudate.  Eyes: Conjunctivae and EOM are normal. Pupils are equal, round, and reactive to light. No scleral icterus.  No nystagmus.  Neck: Normal range of motion. Neck supple.  No nuchal rigidity or meningismus.   Cardiovascular: Normal rate, regular rhythm and normal heart sounds.   Pulmonary/Chest: Effort normal and breath sounds normal. No respiratory distress. She has no wheezes. She has no rales.  Chest expansion symmetric.  Abdominal: Soft. She exhibits no distension. There is no tenderness. There is no rebound and no guarding.  Soft, nontender. No peritoneal signs or masses.  Musculoskeletal: Normal range of motion.  Neurological: She is alert and oriented to person, place, and time. No cranial nerve deficit. She exhibits normal muscle tone. Coordination normal.  GCS 15. Patient speaks in full goal oriented sentences. No focal neurologic deficits appreciated. Patient moves extremities without ataxia. She ambulates with normal gait to the restroom multiple times without unsteadiness.  Skin: Skin is warm and dry. No rash noted. She is not diaphoretic. No erythema. No pallor.  Psychiatric: She has a normal mood and affect. Her  behavior is normal.    ED Course  Procedures (including critical care time) Labs Review Labs Reviewed  CBG MONITORING, ED    Imaging Review No results found.   EKG Interpretation   Date/Time:  Saturday April 27 2014 02:47:05 EDT Ventricular Rate:  65 PR Interval:  146 QRS Duration: 74 QT Interval:  411 QTC Calculation: 427 R Axis:   72 Text Interpretation:  Sinus rhythm Atrial premature complex Confirmed by  TEST, Record ([id number]) on 04/29/2014 7:50:13 AM      MDM   Final diagnoses:  Vasovagal syncope    38 year old female presents after questionable syncopal episode. Orthostatic stable in ED. CBC also stable. EKG unchanged from prior. Patient without chest pain or shortness of breath. No history of ACS or cardiac arrhythmias. Patient nontoxic and nonseptic appearing as well as afebrile. 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 No clinical signs of dehydration today as mucous membranes moist, she  has no hypotension, and no tachycardia.  Patient has been ambulatory to restroom in ED multiple times without assistance or unsteady gait. No focal neurologic deficits on exam. Do not believe further work up is indicated based on stability of vitals and physical exam today. Will advise oral hydration and PCP f/u. Return precautions provided. Patient d/c from ED in good condition; patient left before receiving d/c instructions.   Filed Vitals:   04/27/14 0247 04/27/14 0251 04/27/14 0650  BP:  103/68 98/62  Pulse: 67  70  Temp: 98.6 F (37 C)  97.1 F (36.2 C)  TempSrc: Oral  Oral  Resp: 14  18  SpO2: 100%  100%     Antonietta Breach, PA-C 05/04/14 [id number]

## 2014-05-08 ENCOUNTER — Ambulatory Visit (INDEPENDENT_AMBULATORY_CARE_PROVIDER_SITE_OTHER): Payer: BC Managed Care – PPO | Admitting: Family Medicine

## 2014-05-08 ENCOUNTER — Encounter: Payer: Self-pay | Admitting: Family Medicine

## 2014-05-08 VITALS — BP 110/70 | HR 65

## 2014-05-08 DIAGNOSIS — R109 Unspecified abdominal pain: Secondary | ICD-10-CM

## 2014-05-08 DIAGNOSIS — R140 Abdominal distension (gaseous): Secondary | ICD-10-CM

## 2014-05-08 DIAGNOSIS — R1084 Generalized abdominal pain: Secondary | ICD-10-CM

## 2014-05-08 DIAGNOSIS — R141 Gas pain: Secondary | ICD-10-CM

## 2014-05-08 DIAGNOSIS — R142 Eructation: Secondary | ICD-10-CM

## 2014-05-08 DIAGNOSIS — R143 Flatulence: Secondary | ICD-10-CM

## 2014-05-08 NOTE — Patient Instructions (Signed)
We will schedule x-rays and be in touch with you.

## 2014-05-08 NOTE — Progress Notes (Signed)
Subjective:    Patient ID: Gina [name], female    DOB: 1976-05-25, 38 y.o.   MRN: [id number]  HPI The patient has complicated medical history. She is seen as a work in today with varied and multiple complaints. She's had several years of frequent abdominal bloating and cramping and complains that she's had some significant weight gain over the past year. She saw her gynecologist to check thyroid functions which were reportedly normal. She's had recent pelvic ultrasound. She's had previous partial hysterectomy and she was concerned because apparently one of her ovaries had disappeared and was not visible on x-ray. No other abnormality reported.  She sees multiple specialists including chronic pain management and psychiatry. 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   Has tried multiple medications without improvement. . She has frequent bloating symptoms.  Her mother who was diagnosed with lymphoma apparently had similar symptoms the past and patient is basically concerned whether she has similar process. She's not had any appetite changes and no weight loss. No night sweats. She has not noted any recent lymphadenopathy.  She has frequent migratory abdominal pain. Past Medical History  Diagnosis Date  . ABDOMINAL WALL HERNIA 02/27/2010  . ALLERGIC RHINITIS 06/02/2007  . ANOREXIA, CHRONIC 08/07/2008  . ANXIETY 09/15/2010  . BACK PAIN, THORACIC REGION 07/07/2007  . CERVICAL RADICULOPATHY 12/11/2008  . Condyloma acuminatum 04/23/2009  . CONSTIPATION 09/15/2010  . DYSPHAGIA UNSPECIFIED 09/09/2009  . 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 ENDOMETRIOSIS 12/16/2009  . FATIGUE 06/11/2010  . GERD 10/23/2009  . HERPES SIMPLEX INFECTION 06/11/2010  . LENTIGO 04/23/2009  . LUNG NODULE 06/17/2010  . Palpitations 10/23/2009  . Stricture and stenosis of esophagus 05/13/2010  . TOBACCO ABUSE 08/07/2008  . Heart palpitations   . Urinary tract infection   . Rheumatoid  arthritis(714.0)   . Ovarian cyst   . Abnormal Pap smear   . Fibromyalgia   . Narcotic abuse, continuous   . Panic disorder    Past Surgical History  Procedure Laterality Date  . Partial hysterectomy    . Endometrial ablation    . Hernia repair      X3  . Dilation and curettage of uterus      x1   . Cesarean section      x2   . Esophageal dilatation x 4    . Gum surgery    . Abdominal surgery    . Abdominal wall mesh  removal      reports that she has been smoking Cigarettes.  She has been smoking about 0.50 packs per day. She has never used smokeless tobacco. She reports that she does not drink alcohol or use illicit drugs. family history includes Arthritis in her mother; Depression in her mother; Hyperlipidemia in her father; Hypertension in her father. Allergies  Allergen Reactions  . Lamictal [Lamotrigine] Anaphylaxis    Other reaction(s): Urticaria / Hives (ALLERGY)  . Nicotine Swelling and Palpitations    Other reaction(s): Respiratory Distress (ALLERGY/intolerance), Swelling (ALLERGY/intolerance), Tachycardia / Palpitations  (intolerance) Patches, lozenges Patches caused palpations lozenges throat swelled  . Zofran Anaphylaxis  . Chantix [Varenicline Tartrate] Other (See Comments)    "went crazy"  . Codeine     Other reaction(s): Nausea & Vomiting (ALLERGY/intolerance)  . Ibuprofen Nausea And Vomiting  . Metaxalone     Other reaction(s): Other (See Comments) Pt does not remember reaction  . Morphine And Related Other (See Comments)    Makes pt "  very mean"  . Ondansetron     Other reaction(s): Anaphylaxis (ALLERGY), Swelling (ALLERGY/intolerance)  . Skelaxin Other (See Comments)    rash  . Toradol [Ketorolac Tromethamine] Nausea And Vomiting    Other reaction(s): Nausea & Vomiting (ALLERGY/intolerance)  . Varenicline Other (See Comments)    Other reaction(s): Diaphoresis / Sweating (intolerance) "went crazy" Night terrors  . Amoxicillin Rash  . Penicillins  Rash    Other reaction(s): Urticaria / Hives (ALLERGY)   '   Review of Systems  Constitutional: Positive for unexpected weight change. Negative for fever, chills and appetite change.  HENT: Negative for trouble swallowing.   Respiratory: Negative for cough and shortness of breath.   Cardiovascular: Negative for chest pain, palpitations and leg swelling.  Gastrointestinal: Positive for abdominal pain. Negative for nausea and vomiting.  Genitourinary: Negative for dysuria.  Neurological: Positive for weakness. Negative for dizziness.  Hematological: Negative for adenopathy. Does not bruise/bleed easily.       Objective:   Physical Exam  Constitutional: She appears well-developed and well-nourished.  HENT:  Mouth/Throat: Oropharynx is clear and moist.  Neck: Neck supple.  Cardiovascular: Normal rate and regular rhythm.   Pulmonary/Chest: Effort normal and breath sounds normal. No respiratory distress. She has no wheezes. She has no rales.  Abdominal: Soft. Bowel sounds are normal. She exhibits no distension and no mass. There is no tenderness. There is no rebound and no guarding.  Musculoskeletal: She exhibits no edema.  Neurological: She is alert.          Assessment & Plan:  Multiple vague symptoms as above. Patient specifically has concerns whether she may have lymphoma but she does not have any lymphadenopathy or other concerning feature such as appetite or weight loss changes. She's actually had some weight gain recently.  Multiple prior CT scans and we expressed our concern over how many she has had.  Consider abdominal ultrasound with low suspicion of malignancy.  Suspect motility disorder and ?functional/anxiety component to pain.

## 2014-05-08 NOTE — Progress Notes (Signed)
Pre visit review using our clinic review tool, if applicable. No additional management support is needed unless otherwise documented below in the visit note. 

## 2014-05-10 ENCOUNTER — Emergency Department (HOSPITAL_COMMUNITY)
Admission: EM | Admit: 2014-05-10 | Discharge: 2014-05-10 | Disposition: A | Discharge status: Home / Self Care | Payer: BC Managed Care – PPO | Attending: Emergency Medicine | Admitting: Emergency Medicine

## 2014-05-10 ENCOUNTER — Encounter (HOSPITAL_COMMUNITY): Payer: Self-pay | Admitting: Emergency Medicine

## 2014-05-10 ENCOUNTER — Telehealth: Payer: Self-pay | Admitting: Internal Medicine

## 2014-05-10 DIAGNOSIS — Z880 Allergy status to penicillin: Secondary | ICD-10-CM | POA: Insufficient documentation

## 2014-05-10 DIAGNOSIS — Z8719 Personal history of other diseases of the digestive system: Secondary | ICD-10-CM | POA: Insufficient documentation

## 2014-05-10 DIAGNOSIS — F411 Generalized anxiety disorder: Secondary | ICD-10-CM | POA: Insufficient documentation

## 2014-05-10 DIAGNOSIS — R21 Rash and other nonspecific skin eruption: Secondary | ICD-10-CM | POA: Diagnosis present

## 2014-05-10 DIAGNOSIS — Z872 Personal history of diseases of the skin and subcutaneous tissue: Secondary | ICD-10-CM | POA: Insufficient documentation

## 2014-05-10 DIAGNOSIS — F17200 Nicotine dependence, unspecified, uncomplicated: Secondary | ICD-10-CM | POA: Insufficient documentation

## 2014-05-10 DIAGNOSIS — IMO0001 Reserved for inherently not codable concepts without codable children: Secondary | ICD-10-CM | POA: Diagnosis not present

## 2014-05-10 DIAGNOSIS — Z79899 Other long term (current) drug therapy: Secondary | ICD-10-CM | POA: Diagnosis not present

## 2014-05-10 DIAGNOSIS — Z8742 Personal history of other diseases of the female genital tract: Secondary | ICD-10-CM | POA: Insufficient documentation

## 2014-05-10 DIAGNOSIS — Z8739 Personal history of other diseases of the musculoskeletal system and connective tissue: Secondary | ICD-10-CM | POA: Diagnosis not present

## 2014-05-10 DIAGNOSIS — Z8709 Personal history of other diseases of the respiratory system: Secondary | ICD-10-CM | POA: Insufficient documentation

## 2014-05-10 DIAGNOSIS — B029 Zoster without complications: Secondary | ICD-10-CM | POA: Insufficient documentation

## 2014-05-10 MED ORDER — ACYCLOVIR 200 MG/5ML PO SUSP: 800.0000 mg | Freq: Every day | ORAL | Status: AC

## 2014-05-10 MED ORDER — PROMETHAZINE HCL 25 MG RE SUPP: 25.0000 mg | Freq: Four times a day (QID) | RECTAL | Status: DC | PRN

## 2014-05-10 MED ORDER — LORAZEPAM 2 MG/ML IJ SOLN
1.0000 mg | Freq: Once | INTRAMUSCULAR | Status: DC
Start: 2014-05-10 — End: 2014-05-10

## 2014-05-10 MED ORDER — ACYCLOVIR 400 MG PO TABS
800.0000 mg | ORAL_TABLET | Freq: Once | ORAL | Status: DC
  Filled 2014-05-10: qty 2

## 2014-05-10 MED ORDER — PROMETHAZINE HCL 25 MG/ML IJ SOLN
12.5000 mg | Freq: Once | INTRAMUSCULAR | Status: AC
  Administered 2014-05-10: 12.5 mg via INTRAVENOUS
  Filled 2014-05-10: qty 1

## 2014-05-10 MED ORDER — HYDROMORPHONE HCL PF 1 MG/ML IJ SOLN
1.0000 mg | Freq: Once | INTRAMUSCULAR | Status: AC
  Administered 2014-05-10: 1 mg via INTRAVENOUS
  Filled 2014-05-10: qty 1

## 2014-05-10 MED ORDER — LORAZEPAM 1 MG PO TABS: 1.0000 mg | ORAL_TABLET | Freq: Three times a day (TID) | ORAL | Status: DC | PRN

## 2014-05-10 NOTE — Telephone Encounter (Signed)
Pt has shingles and was given acyclovir 200 mg liquid and took one dose and developed sob and neck tightness. Pt took benadryl is feel fine. The acyclovir was prescribed by PA at [facility] yesterday. Please advise.

## 2014-05-10 NOTE — ED Notes (Signed)
Pt states that she began with a rash to her rt buttock this pm; pt states that the pain is intense and burning especially to her rt buttock; pt states that she began nauseous after the pain increased this pm

## 2014-05-10 NOTE — Discharge Instructions (Signed)
Shingles °Shingles (herpes zoster) is an infection that is caused by the same virus that causes chickenpox (varicella). The infection causes a painful skin rash and fluid-filled blisters, which eventually break open, crust over, and heal. It may occur in any area of the body, but it usually affects only one side of the body or face. The pain of shingles usually lasts about 1 month. However, some people with shingles may develop long-term (chronic) pain in the affected area of the body. °Shingles often occurs many years after the person had chickenpox. It is more common: °· In people older than 50 years. °· In people with weakened immune systems, such as those with HIV, AIDS, or cancer. °· In people taking medicines that weaken the immune system, such as transplant medicines. °· In people under great stress. °CAUSES  °Shingles is caused by the varicella zoster virus (VZV), which also causes chickenpox. After a person is infected with the virus, it can remain in the person's body for years in an inactive state (dormant). To cause shingles, the virus reactivates and breaks out as an infection in a nerve root. °The virus can be spread from person to person (contagious) through contact with open blisters of the shingles rash. It will only spread to people who have not had chickenpox. When these people are exposed to the virus, they may develop chickenpox. They will not develop shingles. Once the blisters scab over, the person is no longer contagious and cannot spread the virus to others. °SYMPTOMS  °Shingles shows up in stages. The initial symptoms may be pain, itching, and tingling in an area of the skin. This pain is usually described as burning, stabbing, or throbbing. In a few days or weeks, a painful red rash will appear in the area where the pain, itching, and tingling were felt. The rash is usually on one side of the body in a band or belt-like pattern. Then, the rash usually turns into fluid-filled blisters. They  will scab over and dry up in approximately 2-3 weeks. °Flu-like symptoms may also occur with the initial symptoms, the rash, or the blisters. These may include: °· Fever. °· Chills. °· Headache. °· Upset stomach. °DIAGNOSIS  °Your caregiver will perform a skin exam to diagnose shingles. Skin scrapings or fluid samples may also be taken from the blisters. This sample will be examined under a microscope or sent to a lab for further testing. °TREATMENT  °There is no specific cure for shingles. Your caregiver will likely prescribe medicines to help you manage the pain, recover faster, and avoid long-term problems. This may include antiviral drugs, anti-inflammatory drugs, and pain medicines. °HOME CARE INSTRUCTIONS  °· Take a cool bath or apply cool compresses to the area of the rash or blisters as directed. This may help with the pain and itching.   °· Only take over-the-counter or prescription medicines as directed by your caregiver.   °· Rest as directed by your caregiver. °· Keep your rash and blisters clean with mild soap and cool water or as directed by your caregiver.  °· Do not pick your blisters or scratch your rash. Apply an anti-itch cream or numbing creams to the affected area as directed by your caregiver. °· Keep your shingles rash covered with a loose bandage (dressing). °· Avoid skin contact with: °¨ Babies.   °¨ Pregnant women.   °¨ Children with eczema.   °¨ Elderly people with transplants.   °¨ People with chronic illnesses, such as leukemia or AIDS.   °· Wear loose-fitting clothing to help ease the   pain of material rubbing against the rash. °· Keep all follow-up appointments with your caregiver. If the area involved is on your face, you may receive a referral for follow-up to a specialist, such as an eye doctor (ophthalmologist) or an ear, nose, and throat (ENT) doctor. Keeping all follow-up appointments will help you avoid eye complications, chronic pain, or disability.   °SEEK IMMEDIATE MEDICAL  CARE IF:  °· You have facial pain, pain around the eye area, or loss of feeling on one side of your face. °· You have ear pain or ringing in your ear. °· You have loss of taste. °· Your pain is not relieved with prescribed medicines.   °· Your redness or swelling spreads.   °· You have more pain and swelling.  °· Your condition is worsening or has changed.   °· You have a fever or persistent symptoms for more than 2-3 days. °· You have a fever and your symptoms suddenly get worse. °MAKE SURE YOU: °· Understand these instructions. °· Will watch your condition. °· Will get help right away if you are not doing well or get worse. °Document Released: 10/18/2005 Document Revised: 07/12/2012 Document Reviewed: 06/01/2012 °Exit[employer]® Patient Information ©2015 [employer]. This information is not intended to replace advice given to you by your health care provider. Make sure you discuss any questions you have with your health care provider. ° °

## 2014-05-11 NOTE — Telephone Encounter (Signed)
i spoke with pt last night.  She was fine then.  She has taken Valtrex in past and had already taken 500 mg dose with no problem. She will try  Valtrex 1 mg tid for 7 days.

## 2014-05-13 NOTE — Addendum Note (Signed)
Addended by: Eulas Post on: 05/13/2014 08:20 AM   Modules accepted: Orders

## 2014-05-13 NOTE — ED Provider Notes (Signed)
Medical screening examination/treatment/procedure(s) were performed by non-physician practitioner and as supervising physician I was immediately available for consultation/collaboration.   EKG Interpretation   Date/Time:  Saturday April 27 2014 02:47:05 EDT Ventricular Rate:  65 PR Interval:  146 QRS Duration: 74 QT Interval:  411 QTC Calculation: 427 R Axis:   72 Text Interpretation:  Sinus rhythm Atrial premature complex Confirmed by  TEST, Record ([id number]) on 04/29/2014 7:50:13 AM       Elyn Peers, MD 05/13/14 [phone]

## 2014-05-17 ENCOUNTER — Ambulatory Visit: Payer: BC Managed Care – PPO | Admitting: Internal Medicine

## 2014-05-17 ENCOUNTER — Ambulatory Visit (INDEPENDENT_AMBULATORY_CARE_PROVIDER_SITE_OTHER): Payer: BC Managed Care – PPO | Admitting: Internal Medicine

## 2014-05-17 ENCOUNTER — Encounter: Payer: Self-pay | Admitting: Internal Medicine

## 2014-05-17 VITALS — BP 92/60 | HR 70 | Wt 170.0 lb

## 2014-05-17 DIAGNOSIS — R109 Unspecified abdominal pain: Secondary | ICD-10-CM

## 2014-05-17 DIAGNOSIS — R599 Enlarged lymph nodes, unspecified: Secondary | ICD-10-CM

## 2014-05-17 DIAGNOSIS — R591 Generalized enlarged lymph nodes: Secondary | ICD-10-CM

## 2014-05-17 NOTE — Progress Notes (Signed)
Subjective:    Patient ID: Gina [name], female    DOB: [birth year], 38 y.o.   MRN: [id number]  HPI The patient has multiple family stressors with a daughter that attempted suicide. Has chronic pain followed by pain management. Has been on lexapro from psychiatry with Ambien . Has noted night mares and GI bloating Focused on mothers ilness  Was diagnosed about two weeks ago with shingles  Was seen for endometriosis and has gained weight and has increased abdominal bloating  Has "every symptom that my mother has" Mother has non hodgkins  Still needs to ave the [place] doen that Dr Elease Hashimoto ordered   Still smokes   Review of Systems  Constitutional: Negative for activity change, appetite change and fatigue.  HENT: Negative for congestion, ear pain, postnasal drip and sinus pressure.   Eyes: Negative for redness and visual disturbance.  Respiratory: Negative for cough, shortness of breath and wheezing.   Gastrointestinal: Negative for abdominal pain and abdominal distention.  Genitourinary: Negative for dysuria, frequency and menstrual problem.  Musculoskeletal: Negative for arthralgias, joint swelling, myalgias and neck pain.  Skin: Negative for rash and wound.  Neurological: Negative for dizziness, weakness and headaches.  Hematological: Negative for adenopathy. Does not bruise/bleed easily.  Psychiatric/Behavioral: Negative for sleep disturbance and decreased concentration.   Past Medical History  Diagnosis Date  . ABDOMINAL WALL HERNIA 02/27/2010  . ALLERGIC RHINITIS 06/02/2007  . ANOREXIA, CHRONIC 08/07/2008  . ANXIETY 09/15/2010  . BACK PAIN, THORACIC REGION 07/07/2007  . CERVICAL RADICULOPATHY 12/11/2008  . Condyloma acuminatum 04/23/2009  . CONSTIPATION 09/15/2010  . DYSPHAGIA UNSPECIFIED 09/09/2009  . 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 ENDOMETRIOSIS 12/16/2009  . FATIGUE 06/11/2010  . GERD 10/23/2009  . HERPES SIMPLEX INFECTION 06/11/2010  . LENTIGO 04/23/2009  . LUNG NODULE  06/17/2010  . Palpitations 10/23/2009  . Stricture and stenosis of esophagus 05/13/2010  . TOBACCO ABUSE 08/07/2008  . Heart palpitations   . Urinary tract infection   . Rheumatoid arthritis(714.0)   . Ovarian cyst   . Abnormal Pap smear   . Fibromyalgia   . Narcotic abuse, continuous   . Panic disorder     History   Social History  . Marital Status: Married    Spouse Name: N/A    Number of Children: N/A  . Years of Education: N/A   Occupational History  . Not on file.   Social History Main Topics  . Smoking status: Current Every Day Smoker -- 0.50 packs/day    Types: Cigarettes  . Smokeless tobacco: Never Used  . Alcohol Use: No  . Drug Use: No  . Sexual Activity: Not on file   Other Topics Concern  . Not on file   Social History Narrative  . No narrative on file    Past Surgical History  Procedure Laterality Date  . Partial hysterectomy    . Endometrial ablation    . Hernia repair      X3  . Dilation and curettage of uterus      x1   . Cesarean section      x2   . Esophageal dilatation x 4    . Gum surgery    . Abdominal surgery    . Abdominal wall mesh  removal      Family History  Problem Relation Age of Onset  . Depression Mother   . Arthritis Mother   . Hyperlipidemia Father   . Hypertension Father     Allergies  Allergen Reactions  .  Lamictal [Lamotrigine] Anaphylaxis    Other reaction(s): Urticaria / Hives (ALLERGY)  . Nicotine Swelling and Palpitations    Other reaction(s): Respiratory Distress (ALLERGY/intolerance), Swelling (ALLERGY/intolerance), Tachycardia / Palpitations  (intolerance) Patches, lozenges Patches caused palpations lozenges throat swelled  . Zofran Anaphylaxis  . Chantix [Varenicline Tartrate] Other (See Comments)    "went crazy"  . Codeine     Other reaction(s): Nausea & Vomiting (ALLERGY/intolerance)  . Ibuprofen Nausea And Vomiting  . Metaxalone     Other reaction(s): Other (See Comments) Pt does not remember  reaction  . Morphine And Related Other (See Comments)    Makes pt "very mean"  . Ondansetron     Other reaction(s): Anaphylaxis (ALLERGY), Swelling (ALLERGY/intolerance)  . Skelaxin Other (See Comments)    rash  . Toradol [Ketorolac Tromethamine] Nausea And Vomiting    Other reaction(s): Nausea & Vomiting (ALLERGY/intolerance)  . Varenicline Other (See Comments)    Other reaction(s): Diaphoresis / Sweating (intolerance) "went crazy" Night terrors  . Amoxicillin Rash  . Penicillins Rash    Other reaction(s): Urticaria / Hives (ALLERGY)    Current Outpatient Prescriptions on File Prior to Visit  Medication Sig Dispense Refill  . acetaminophen (TYLENOL) 325 MG tablet Take 325 mg by mouth every 4 (four) hours as needed for pain.       Marland Kitchen ALPRAZolam (XANAX) 1 MG tablet Take 1 mg by mouth 4 (four) times daily as needed for anxiety.      Marland Kitchen escitalopram (LEXAPRO) 20 MG tablet Take 20 mg by mouth daily.       Marland Kitchen LORazepam (ATIVAN) 1 MG tablet Take 1 tablet (1 mg total) by mouth 3 (three) times daily as needed for anxiety.  15 tablet  0  . methocarbamol (ROBAXIN) 500 MG tablet Take 500 mg by mouth 3 (three) times daily as needed for muscle spasms.       Marland Kitchen oxyCODONE-acetaminophen (PERCOCET) 10-325 MG per tablet Take 1 tablet by mouth every 6 (six) hours as needed for pain.      . promethazine (PHENERGAN) 25 MG suppository Place 1 suppository (25 mg total) rectally every 6 (six) hours as needed for nausea or vomiting.  12 each  0  . zolpidem (AMBIEN) 5 MG tablet Take 5 mg by mouth at bedtime as needed for sleep.       No current facility-administered medications on file prior to visit.    BP 92/60  Pulse 70  Wt 170 lb (77.111 kg)       Objective:   Physical Exam  Nursing note and vitals reviewed. Eyes: Conjunctivae are normal.  Cardiovascular: Normal rate and regular rhythm.   Abdominal: Bowel sounds are normal.  Musculoskeletal: She exhibits edema and tenderness.  Skin: Skin is dry.            Assessment & Plan:  Flow cytometry  Due to fear of lymphoma and hx of many many radiological procedures  Hx of lung nodules  Weight gain due to stress  Cortisol levels  Last labs  CBC  And platelets normal

## 2014-05-17 NOTE — ED Provider Notes (Signed)
CSN: [id number]     Arrival date & time 05/10/14  0043 History   First MD Initiated Contact with Patient 05/10/14 0153     Chief Complaint  Patient presents with  . Rash     (Consider location/radiation/quality/duration/timing/severity/associated sxs/prior Treatment) Patient is a 38 y.o. female presenting with rash. The history is provided by the patient. No language interpreter was used.  Rash Location:  Ano-genital Ano-genital rash location:  R buttock Severity:  Severe Onset quality:  Gradual Associated symptoms: no abdominal pain and no fever   Associated symptoms comment:  She complains of right buttock pain that is severe. She noticed a rash this morning and was concerned she might have shingles. No draining lesions, sores or blisters. No fever, or vomiting. She states that the worst pain makes her nauseous.    Past Medical History  Diagnosis Date  . ABDOMINAL WALL HERNIA 02/27/2010  . ALLERGIC RHINITIS 06/02/2007  . ANOREXIA, CHRONIC 08/07/2008  . ANXIETY 09/15/2010  . BACK PAIN, THORACIC REGION 07/07/2007  . CERVICAL RADICULOPATHY 12/11/2008  . Condyloma acuminatum 04/23/2009  . CONSTIPATION 09/15/2010  . DYSPHAGIA UNSPECIFIED 09/09/2009  . 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 ENDOMETRIOSIS 12/16/2009  . FATIGUE 06/11/2010  . GERD 10/23/2009  . HERPES SIMPLEX INFECTION 06/11/2010  . LENTIGO 04/23/2009  . LUNG NODULE 06/17/2010  . Palpitations 10/23/2009  . Stricture and stenosis of esophagus 05/13/2010  . TOBACCO ABUSE 08/07/2008  . Heart palpitations   . Urinary tract infection   . Rheumatoid arthritis(714.0)   . Ovarian cyst   . Abnormal Pap smear   . Fibromyalgia   . Narcotic abuse, continuous   . Panic disorder    Past Surgical History  Procedure Laterality Date  . Partial hysterectomy    . Endometrial ablation    . Hernia repair      X3  . Dilation and curettage of uterus      x1   . Cesarean section      x2   . Esophageal dilatation x 4    . Gum surgery    .  Abdominal surgery    . Abdominal wall mesh  removal     Family History  Problem Relation Age of Onset  . Depression Mother   . Arthritis Mother   . Hyperlipidemia Father   . Hypertension Father    History  Substance Use Topics  . Smoking status: Current Every Day Smoker -- 0.50 packs/day    Types: Cigarettes  . Smokeless tobacco: Never Used  . Alcohol Use: No   OB History   Grav Para Term Preterm Abortions TAB SAB Ect Mult Living   3 2 2  0 1 0 1 0 0 2     Review of Systems  Constitutional: Negative for fever.  Gastrointestinal: Negative for abdominal pain.  Musculoskeletal:       See HPI.  Skin: Positive for rash.      Allergies  Lamictal; Nicotine; Zofran; Chantix; Codeine; Ibuprofen; Metaxalone; Morphine and related; Ondansetron; Skelaxin; Toradol; Varenicline; Amoxicillin; and Penicillins  Home Medications   Prior to Admission medications   Medication Sig Start Date End Date Taking? Authorizing Provider  acetaminophen (TYLENOL) 325 MG tablet Take 325 mg by mouth every 4 (four) hours as needed for pain.    Yes Historical Provider, MD  ALPRAZolam Duanne Moron) 1 MG tablet Take 1 mg by mouth 4 (four) times daily as needed for anxiety.   Yes Historical Provider, MD  escitalopram (LEXAPRO) 20 MG tablet Take 20 mg  by mouth daily.    Yes Historical Provider, MD  methocarbamol (ROBAXIN) 500 MG tablet Take 500 mg by mouth 3 (three) times daily as needed for muscle spasms.  05/01/13  Yes [place], PA-C  oxyCODONE-acetaminophen (PERCOCET) 10-325 MG per tablet Take 1 tablet by mouth every 6 (six) hours as needed for pain.   Yes Historical Provider, MD  zolpidem (AMBIEN) 5 MG tablet Take 5 mg by mouth at bedtime as needed for sleep.   Yes Historical Provider, MD  LORazepam (ATIVAN) 1 MG tablet Take 1 tablet (1 mg total) by mouth 3 (three) times daily as needed for anxiety. 05/10/14   Dealva Lafoy A Sharice Harriss, PA-C  promethazine (PHENERGAN) 25 MG suppository Place 1 suppository (25 mg total)  rectally every 6 (six) hours as needed for nausea or vomiting. 05/10/14   Ane Conerly A Garik Diamant, PA-C   BP 100/66  Pulse 66  Temp(Src) 98.5 F (36.9 C) (Oral)  Resp 18  SpO2 100% Physical Exam  Constitutional: She is oriented to person, place, and time. She appears well-developed and well-nourished.  Neck: Normal range of motion.  Pulmonary/Chest: Effort normal.  Genitourinary:  Rare singular raised nonblistering bumps to right buttock. Extremely tender to light palpation over entire buttock. Exam limited.   Neurological: She is alert and oriented to person, place, and time.  Skin: Skin is warm and dry.    ED Course  Procedures (including critical care time) Labs Review Labs Reviewed - No data to display  Imaging Review No results found.   EKG Interpretation None      MDM   Final diagnoses:  Zoster without complications    DDx: pre-rash shingles vs early cellulitis. No redness or fever to suggest cellulitis. No rash visualized related to shingles, however, possible that rash will yet develop. Consider it most likely given degress of patient's subjective pain complaint. Pain medication given, Acyclovir. Encouraged PCP follow up.    Dewaine Oats, PA-C 05/17/14 2119

## 2014-05-17 NOTE — Addendum Note (Signed)
Addended by: Joyce Gross R on: 05/17/2014 04:34 PM   Modules accepted: Orders

## 2014-05-17 NOTE — Progress Notes (Signed)
Pre visit review using our clinic review tool, if applicable. No additional management support is needed unless otherwise documented below in the visit note. 

## 2014-05-18 NOTE — ED Provider Notes (Signed)
Medical screening examination/treatment/procedure(s) were performed by non-physician practitioner and as supervising physician I was immediately available for consultation/collaboration.   EKG Interpretation None        Orpah Greek, MD 05/18/14 [phone]

## 2014-05-20 LAB — REFLEX BLAST SCREEN

## 2014-05-20 LAB — IMMUNOPHENOTYPING BY FLOW CYTOMETRY

## 2014-05-22 ENCOUNTER — Telehealth: Payer: Self-pay | Admitting: Internal Medicine

## 2014-05-22 NOTE — Telephone Encounter (Signed)
Pt is requesting lab results that were done on 05/17/14.

## 2014-05-24 ENCOUNTER — Ambulatory Visit
Admission: RE | Admit: 2014-05-24 | Discharge: 2014-05-24 | Disposition: A | Discharge status: Home / Self Care | Payer: BC Managed Care – PPO | Source: Ambulatory Visit | Attending: Family Medicine | Admitting: Family Medicine

## 2014-05-24 ENCOUNTER — Other Ambulatory Visit: Payer: BC Managed Care – PPO

## 2014-05-24 DIAGNOSIS — R1084 Generalized abdominal pain: Secondary | ICD-10-CM

## 2014-05-24 NOTE — Telephone Encounter (Signed)
Labs showed no lymphoma  Or cancer  [place] was normal with normal liver spleen and kidnies

## 2014-05-24 NOTE — Telephone Encounter (Signed)
Pt returned call; spoke with pt and advised about lab work.  Pt wants it to be said that she 100% she does not have cancer.  I advised pt that I could not say 100% but based on her lab work it showed no lymphoma or cancer.

## 2014-05-24 NOTE — Telephone Encounter (Signed)
Pt also had ultrasound done to at [facility]

## 2014-05-24 NOTE — Telephone Encounter (Signed)
Pt is call back req lab results

## 2014-05-24 NOTE — Telephone Encounter (Signed)
Per Dr. Arnoldo Morale according to lab results pt specifically does not have lymphoma or cancer.

## 2014-05-24 NOTE — Telephone Encounter (Signed)
Attempted to call pt; left a message for return call.  

## 2014-05-24 NOTE — Telephone Encounter (Signed)
Left a message for return call.  

## 2014-05-30 NOTE — Telephone Encounter (Signed)
Called and spoke with pt and pt is aware.  Pt was given exact words that Dr. Arnoldo Morale stated.  Pt verbalized understanding.

## 2014-06-07 ENCOUNTER — Encounter: Payer: Self-pay | Admitting: Internal Medicine

## 2014-06-30 ENCOUNTER — Encounter (HOSPITAL_BASED_OUTPATIENT_CLINIC_OR_DEPARTMENT_OTHER): Payer: Self-pay | Admitting: Emergency Medicine

## 2014-06-30 ENCOUNTER — Emergency Department (HOSPITAL_BASED_OUTPATIENT_CLINIC_OR_DEPARTMENT_OTHER)
Admission: EM | Admit: 2014-06-30 | Discharge: 2014-06-30 | Disposition: A | Discharge status: Home / Self Care | Payer: BC Managed Care – PPO | Attending: Emergency Medicine | Admitting: Emergency Medicine

## 2014-06-30 DIAGNOSIS — Z87440 Personal history of urinary (tract) infections: Secondary | ICD-10-CM | POA: Diagnosis not present

## 2014-06-30 DIAGNOSIS — F17200 Nicotine dependence, unspecified, uncomplicated: Secondary | ICD-10-CM | POA: Insufficient documentation

## 2014-06-30 DIAGNOSIS — Z880 Allergy status to penicillin: Secondary | ICD-10-CM | POA: Insufficient documentation

## 2014-06-30 DIAGNOSIS — M542 Cervicalgia: Secondary | ICD-10-CM | POA: Insufficient documentation

## 2014-06-30 DIAGNOSIS — M069 Rheumatoid arthritis, unspecified: Secondary | ICD-10-CM | POA: Insufficient documentation

## 2014-06-30 DIAGNOSIS — Z8719 Personal history of other diseases of the digestive system: Secondary | ICD-10-CM | POA: Insufficient documentation

## 2014-06-30 DIAGNOSIS — Z8742 Personal history of other diseases of the female genital tract: Secondary | ICD-10-CM | POA: Insufficient documentation

## 2014-06-30 DIAGNOSIS — Z8709 Personal history of other diseases of the respiratory system: Secondary | ICD-10-CM | POA: Diagnosis not present

## 2014-06-30 DIAGNOSIS — M25519 Pain in unspecified shoulder: Secondary | ICD-10-CM | POA: Insufficient documentation

## 2014-06-30 DIAGNOSIS — Z79899 Other long term (current) drug therapy: Secondary | ICD-10-CM | POA: Insufficient documentation

## 2014-06-30 DIAGNOSIS — F410 Panic disorder [episodic paroxysmal anxiety] without agoraphobia: Secondary | ICD-10-CM | POA: Insufficient documentation

## 2014-06-30 DIAGNOSIS — Z8619 Personal history of other infectious and parasitic diseases: Secondary | ICD-10-CM | POA: Diagnosis not present

## 2014-06-30 DIAGNOSIS — Z872 Personal history of diseases of the skin and subcutaneous tissue: Secondary | ICD-10-CM | POA: Diagnosis not present

## 2014-06-30 MED ORDER — DIAZEPAM 10 MG PO TABS: 10.0000 mg | ORAL_TABLET | Freq: Four times a day (QID) | ORAL | Status: DC | PRN

## 2014-06-30 MED ORDER — LORAZEPAM 2 MG/ML IJ SOLN
1.0000 mg | Freq: Once | INTRAMUSCULAR | Status: AC
Start: 2014-06-30 — End: 2014-06-30
  Administered 2014-06-30: 1 mg via INTRAVENOUS
  Filled 2014-06-30: qty 1

## 2014-06-30 MED ORDER — HYDROMORPHONE HCL PF 1 MG/ML IJ SOLN
2.0000 mg | Freq: Once | INTRAMUSCULAR | Status: AC
  Administered 2014-06-30: 2 mg via INTRAVENOUS
  Filled 2014-06-30: qty 2

## 2014-06-30 MED ORDER — PROMETHAZINE HCL 25 MG/ML IJ SOLN
25.0000 mg | Freq: Once | INTRAMUSCULAR | Status: AC
  Administered 2014-06-30: 25 mg via INTRAVENOUS
  Filled 2014-06-30: qty 1

## 2014-06-30 NOTE — ED Provider Notes (Signed)
CSN: [id number]     Arrival date & time 06/30/14  0233 History   None    Chief complaint: Pain  (Consider location/radiation/quality/duration/timing/severity/associated sxs/prior Treatment) The history is provided by the patient.   38 year old female has been having severe, sharp pain in the left side of her neck radiating down her left arm and left leg. This started about 11:30 AM and has been constant. Nothing makes it better nothing makes it worse. There has been some numbness in the same area. She is noted to not left side of her neck which seems to be the source of her pain. She has taken her usual pain medication of oxycodone but it has not helped. She is treated for chronic pain but it is usually in a different location. She does not recall any trauma or unusual activity. She denies bowel or bladder dysfunction. She rates pain at the 8/10.  Past Medical History  Diagnosis Date  . ABDOMINAL WALL HERNIA 02/27/2010  . ALLERGIC RHINITIS 06/02/2007  . ANOREXIA, CHRONIC 08/07/2008  . ANXIETY 09/15/2010  . BACK PAIN, THORACIC REGION 07/07/2007  . CERVICAL RADICULOPATHY 12/11/2008  . Condyloma acuminatum 04/23/2009  . CONSTIPATION 09/15/2010  . DYSPHAGIA UNSPECIFIED 09/09/2009  . 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 ENDOMETRIOSIS 12/16/2009  . FATIGUE 06/11/2010  . GERD 10/23/2009  . HERPES SIMPLEX INFECTION 06/11/2010  . LENTIGO 04/23/2009  . LUNG NODULE 06/17/2010  . Palpitations 10/23/2009  . Stricture and stenosis of esophagus 05/13/2010  . TOBACCO ABUSE 08/07/2008  . Heart palpitations   . Urinary tract infection   . Rheumatoid arthritis(714.0)   . Ovarian cyst   . Abnormal Pap smear   . Fibromyalgia   . Narcotic abuse, continuous   . Panic disorder    Past Surgical History  Procedure Laterality Date  . Partial hysterectomy    . Endometrial ablation    . Hernia repair      X3  . Dilation and curettage of uterus      x1   . Cesarean section      x2   . Esophageal dilatation x 4    .  Gum surgery    . Abdominal surgery    . Abdominal wall mesh  removal     Family History  Problem Relation Age of Onset  . Depression Mother   . Arthritis Mother   . Hyperlipidemia Father   . Hypertension Father    History  Substance Use Topics  . Smoking status: Current Every Day Smoker -- 0.50 packs/day    Types: Cigarettes  . Smokeless tobacco: Never Used  . Alcohol Use: No   OB History   Grav Para Term Preterm Abortions TAB SAB Ect Mult Living   3 2 2  0 1 0 1 0 0 2     Review of Systems  All other systems reviewed and are negative.     Allergies  Lamictal; Nicotine; Zofran; Chantix; Codeine; Ibuprofen; Metaxalone; Morphine and related; Ondansetron; Skelaxin; Toradol; Varenicline; Amoxicillin; and Penicillins  Home Medications   Prior to Admission medications   Medication Sig Start Date End Date Taking? Authorizing Provider  acetaminophen (TYLENOL) 325 MG tablet Take 325 mg by mouth every 4 (four) hours as needed for pain.     Historical Provider, MD  ALPRAZolam Duanne Moron) 1 MG tablet Take 1 mg by mouth 4 (four) times daily as needed for anxiety.    Historical Provider, MD  escitalopram (LEXAPRO) 20 MG tablet Take 20 mg by mouth daily.  Historical Provider, MD  LORazepam (ATIVAN) 1 MG tablet Take 1 tablet (1 mg total) by mouth 3 (three) times daily as needed for anxiety. 05/10/14   Shari A Upstill, PA-C  methocarbamol (ROBAXIN) 500 MG tablet Take 500 mg by mouth 3 (three) times daily as needed for muscle spasms.  05/01/13   Fransico Meadow, PA-C  oxyCODONE-acetaminophen (PERCOCET) 10-325 MG per tablet Take 1 tablet by mouth every 6 (six) hours as needed for pain.    Historical Provider, MD  promethazine (PHENERGAN) 25 MG suppository Place 1 suppository (25 mg total) rectally every 6 (six) hours as needed for nausea or vomiting. 05/10/14   Shari A Upstill, PA-C  zolpidem (AMBIEN) 5 MG tablet Take 5 mg by mouth at bedtime as needed for sleep.    Historical Provider, MD   BP  107/60  Pulse 70  Temp(Src) 98.2 F (36.8 C) (Oral)  Resp 18  Ht 5\' 7"  (1.702 m)  Wt 160 lb (72.576 kg)  BMI 25.05 kg/m2  SpO2 100% Physical Exam  Nursing note and vitals reviewed.  38 year old female, who appears to be in pain, but is in no acute distress. Vital signs are normal. Oxygen saturation is 100%, which is normal. Head is normocephalic and atraumatic. PERRLA, EOMI. Oropharynx is clear. Neck has moderate spasm of the left paracervical muscles with some marked tenderness at the midportion of the left paracervical muscles. There is no midline tenderness. There is no adenopathy or JVD. Back is nontender and there is no CVA tenderness. Lungs are clear without rales, wheezes, or rhonchi. Chest is nontender. Heart has regular rate and rhythm without murmur. Abdomen is soft, flat, nontender without masses or hepatosplenomegaly and peristalsis is normoactive. Extremities have no cyanosis or edema, full range of motion is present. Skin is warm and dry without rash. Neurologic: Mental status is normal, cranial nerves are intact there is slight decreased pinprick sensation diffusely throughout the left arm and leg. There is slight weakness on the left compared to the right with strength 4/5, but it is not clear whether this is just related to pain as opposed to actual weakness.  ED Course  Procedures (including critical care time)  MDM   Final diagnoses:  Neck pain    Left arm and leg pain of uncertain cause. This is not appear to be a neurologic event as her pain does not follow any peripheral nervous system pathways. She will be given hydromorphone for pain and also is given lorazepam and she'll be reevaluated once pain is under control. Old records are reviewed and she has prior ED visits for musculoskeletal pain   She feels much better after above noted treatment. Reexam shows no motor deficits. She is discharged with prescription for diazepam.  Delora Fuel, MD 46/56/81 [id number]

## 2014-06-30 NOTE — Discharge Instructions (Signed)
Continue with your pain management regimen. Take Diazepam if your muscle spasm is not responding to your current muscle relaxer.  Diazepam tablets What is this medicine? DIAZEPAM (dye AZ e pam) is a benzodiazepine. It is used to treat anxiety and nervousness. It also can help treat alcohol withdrawal, relax muscles, and treat certain types of seizures. This medicine may be used for other purposes; ask your health care provider or pharmacist if you have questions. COMMON BRAND NAME(S): Valium What should I tell my health care provider before I take this medicine? They need to know if you have any of these conditions -an alcohol or drug abuse problem -bipolar disorder, depression, psychosis or other mental health condition -glaucoma -kidney or liver disease -lung or breathing disease -myasthenia gravis -Parkinson's disease -seizures or a history of seizures -suicidal thoughts -an unusual or allergic reaction to diazepam, other benzodiazepines, foods, dyes, or preservatives -pregnant or trying to get pregnant -breast-feeding How should I use this medicine? Take this medicine by mouth with a glass of water. Follow the directions on the prescription label. If this medicine upsets your stomach, take it with food or milk. Take your doses at regular intervals. Do not take your medicine more often than directed. If you have been taking this medicine regularly for some time, do not suddenly stop taking it. You must gradually reduce the dose or you may get severe side effects. Ask your doctor or health care professional for advice. Even after you stop taking this medicine it can still affect your body for several days. Talk to your pediatrician regarding the use of this medicine in children. Special care may be needed. Overdosage: If you think you have taken too much of this medicine contact a poison control center or emergency room at once. NOTE: This medicine is only for you. Do not share this  medicine with others. What if I miss a dose? If you miss a dose, take it as soon as you can. If it is almost time for your next dose, take only that dose. Do not take double or extra doses. What may interact with this medicine? -cimetidine -grapefruit juice -herbal or dietary supplements like kava kava, melatonin, St. John's Wort, or valerian -medicines for anxiety or sleeping problems, like alprazolam, lorazepam, or triazolam -medicines for depression, mental problems or psychiatric disturbances -medicines for HIV infection or AIDS -prescription pain medicines -rifampin, rifapentine, or rifabutin -some medicines for seizures like carbamazepine, phenobarbital, phenytoin, or primidone This list may not describe all possible interactions. Give your health care provider a list of all the medicines, herbs, non-prescription drugs, or dietary supplements you use. Also tell them if you smoke, drink alcohol, or use illegal drugs. Some items may interact with your medicine. What should I watch for while using this medicine? Visit your doctor or health care professional for regular checks on your progress. Your body can become dependent on this medicine. Ask your doctor or health care professional if you still need to take it. You may get drowsy or dizzy. Do not drive, use machinery, or do anything that needs mental alertness until you know how this medicine affects you. To reduce the risk of dizzy and fainting spells, do not stand or sit up quickly, especially if you are an older patient. Alcohol may increase dizziness and drowsiness. Avoid alcoholic drinks. Do not treat yourself for coughs, colds or allergies without asking your doctor or health care professional for advice. Some ingredients can increase possible side effects. What side effects may  I notice from receiving this medicine? Side effects that you should report to your doctor or health care professional as soon as possible: -allergic reactions  like skin rash, itching or hives, swelling of the face, lips, or tongue -angry, confused, depressed, other mood changes -breathing problems -feeling faint or lightheaded, falls -muscle cramps -problems with balance, talking, walking -restlessness -tremors -trouble passing urine or change in the amount of urine -unusually weak or tired Side effects that usually do not require medical attention (report to your doctor or health care professional if they continue or are bothersome): -difficulty sleeping, nightmares -dizziness, drowsiness, clumsiness, or unsteadiness, a hangover effect -headache -nausea, vomiting This list may not describe all possible side effects. Call your doctor for medical advice about side effects. You may report side effects to FDA at 1-800-FDA-1088. Where should I keep my medicine? Keep out of the reach of children. This medicine can be abused. Keep your medicine in a safe place to protect it from theft. Do not share this medicine with anyone. Selling or giving away this medicine is dangerous and against the law. Store at room temperature between 15 and 30 degrees C (59 and 86 degrees F). Protect from light. Keep container tightly closed. Throw away any unused medicine after the expiration date. NOTE: This sheet is a summary. It may not cover all possible information. If you have questions about this medicine, talk to your doctor, pharmacist, or health care provider.  2015, [employer]. (2008-02-05 16:57:35)

## 2014-06-30 NOTE — ED Notes (Signed)
Ok to [employer] pt until more alert prior to discharge home

## 2014-06-30 NOTE — ED Notes (Signed)
Pt c/o left neck and shoulder pain that radiates down left arm, as well as left leg pain

## 2014-08-30 ENCOUNTER — Encounter: Payer: Self-pay | Admitting: Internal Medicine

## 2014-08-30 ENCOUNTER — Ambulatory Visit (INDEPENDENT_AMBULATORY_CARE_PROVIDER_SITE_OTHER): Payer: BC Managed Care – PPO | Admitting: Internal Medicine

## 2014-08-30 ENCOUNTER — Ambulatory Visit (INDEPENDENT_AMBULATORY_CARE_PROVIDER_SITE_OTHER)
Admission: RE | Admit: 2014-08-30 | Discharge: 2014-08-30 | Disposition: A | Discharge status: Home / Self Care | Payer: BC Managed Care – PPO | Source: Ambulatory Visit | Attending: Internal Medicine | Admitting: Internal Medicine

## 2014-08-30 VITALS — BP 110/62 | HR 97 | Temp 98.5°F | Resp 16 | Ht 67.0 in

## 2014-08-30 DIAGNOSIS — R059 Cough, unspecified: Secondary | ICD-10-CM

## 2014-08-30 DIAGNOSIS — R05 Cough: Secondary | ICD-10-CM

## 2014-08-30 DIAGNOSIS — J209 Acute bronchitis, unspecified: Secondary | ICD-10-CM | POA: Insufficient documentation

## 2014-08-30 DIAGNOSIS — Z720 Tobacco use: Secondary | ICD-10-CM | POA: Diagnosis not present

## 2014-08-30 DIAGNOSIS — F17200 Nicotine dependence, unspecified, uncomplicated: Secondary | ICD-10-CM

## 2014-08-30 MED ORDER — BENZONATATE 100 MG PO CAPS: 100.0000 mg | ORAL_CAPSULE | Freq: Three times a day (TID) | ORAL | Status: DC | PRN

## 2014-08-30 MED ORDER — AZITHROMYCIN 250 MG PO TABS: ORAL_TABLET | ORAL | Status: DC

## 2014-08-30 NOTE — Progress Notes (Signed)
   Subjective:    Patient ID: Gina [name], female    DOB: 07/11/1976, 38 y.o.   MRN: [id number]  HPI The patient is a 37 YO female who is coming in today to establish care and with acute complaints. She has extensive PMH reviewed. She is having some congestion and cough for the last 10 days. She denies fever but is also having some drainage.She does not wish to talk about her chronic problems but is dealing with her pain from neurology and depression with psych. She denies SI/HI.  Review of Systems  Constitutional: Positive for chills. Negative for fever, activity change, appetite change, fatigue and unexpected weight change.  HENT: Positive for congestion, postnasal drip and rhinorrhea. Negative for sinus pressure, sneezing and sore throat.   Respiratory: Positive for cough. Negative for chest tightness, shortness of breath and wheezing.   Cardiovascular: Negative for chest pain, palpitations and leg swelling.  Gastrointestinal: Positive for abdominal pain. Negative for abdominal distention.       Chronic  Musculoskeletal: Positive for arthralgias, back pain and myalgias.  Skin: Negative.   Psychiatric/Behavioral: Positive for dysphoric mood and decreased concentration. Negative for suicidal ideas and self-injury. The patient is nervous/anxious.       Objective:   Physical Exam  Constitutional: She is oriented to person, place, and time. She appears well-developed and well-nourished.  HENT:  Head: Normocephalic and atraumatic.  Right Ear: External ear normal.  Left Ear: External ear normal.  Nose with some yellow crusting, oropharynx with some clear drainage.  Eyes: EOM are normal.  Neck: Normal range of motion.  Cardiovascular: Normal rate and regular rhythm.   Pulmonary/Chest: Effort normal. No respiratory distress. She has wheezes. She has no rales. She exhibits no tenderness.  Some mild expiratory wheeze on left greater than right.  Abdominal: Soft. Bowel sounds are normal.    Neurological: She is alert and oriented to person, place, and time.  Skin: Skin is warm and dry.   Filed Vitals:   08/30/14 0909  BP: 110/62  Pulse: 97  Temp: 98.5 F (36.9 C)  TempSrc: Oral  Resp: 16  Height: 5\' 7"  (1.702 m)  SpO2: 95%      Assessment & Plan:

## 2014-08-30 NOTE — Assessment & Plan Note (Signed)
Advised that her cigarettes are not helping her breathing problems. She does not feel that she can quit at this time.

## 2014-08-30 NOTE — Assessment & Plan Note (Signed)
Given multiple allergies use z-pak. Check chest x-ray. Tessalon perles for cough. Advised usage of humidifier in room at night time. She will return for chronic management of PMH.

## 2014-08-30 NOTE — Patient Instructions (Signed)
We will have you use tessalon perles up to 3 times a day for cough.   We will have you take azithromycin starting today. Take 2 pills today, then take 1 pill daily for the next 4 days until it is gone. Call us back if you are not feeling better by Tuesday.   Use a humidifier at night time to help keep everything open and moist to help decrease coughing.  We will have you get a chest x-ray and call you back with the results.  Acute Bronchitis Bronchitis is inflammation of the airways that extend from the windpipe into the lungs (bronchi). The inflammation often causes mucus to develop. This leads to a cough, which is the most common symptom of bronchitis.  In acute bronchitis, the condition usually develops suddenly and goes away over time, usually in a couple weeks. Smoking, allergies, and asthma can make bronchitis worse. Repeated episodes of bronchitis may cause further lung problems.  CAUSES Acute bronchitis is most often caused by the same virus that causes a cold. The virus can spread from person to person (contagious) through coughing, sneezing, and touching contaminated objects. SIGNS AND SYMPTOMS   Cough.   Fever.   Coughing up mucus.   Body aches.   Chest congestion.   Chills.   Shortness of breath.   Sore throat.  DIAGNOSIS  Acute bronchitis is usually diagnosed through a physical exam. Your health care provider will also ask you questions about your medical history. Tests, such as chest X-rays, are sometimes done to rule out other conditions.  TREATMENT  Acute bronchitis usually goes away in a couple weeks. Oftentimes, no medical treatment is necessary. Medicines are sometimes given for relief of fever or cough. Antibiotic medicines are usually not needed but may be prescribed in certain situations. In some cases, an inhaler may be recommended to help reduce shortness of breath and control the cough. A cool mist vaporizer may also be used to help thin bronchial  secretions and make it easier to clear the chest.  HOME CARE INSTRUCTIONS  Get plenty of rest.   Drink enough fluids to keep your urine clear or pale yellow (unless you have a medical condition that requires fluid restriction). Increasing fluids may help thin your respiratory secretions (sputum) and reduce chest congestion, and it will prevent dehydration.   Take medicines only as directed by your health care provider.  If you were prescribed an antibiotic medicine, finish it all even if you start to feel better.  Avoid smoking and secondhand smoke. Exposure to cigarette smoke or irritating chemicals will make bronchitis worse. If you are a smoker, consider using nicotine gum or skin patches to help control withdrawal symptoms. Quitting smoking will help your lungs heal faster.   Reduce the chances of another bout of acute bronchitis by washing your hands frequently, avoiding people with cold symptoms, and trying not to touch your hands to your mouth, nose, or eyes.   Keep all follow-up visits as directed by your health care provider.  SEEK MEDICAL CARE IF: Your symptoms do not improve after 1 week of treatment.  SEEK IMMEDIATE MEDICAL CARE IF:  You develop an increased fever or chills.   You have chest pain.   You have severe shortness of breath.  You have bloody sputum.   You develop dehydration.  You faint or repeatedly feel like you are going to pass out.  You develop repeated vomiting.  You develop a severe headache. MAKE SURE YOU:   Understand  these instructions.  Will watch your condition.  Will get help right away if you are not doing well or get worse. Document Released: 11/25/2004 Document Revised: 03/04/2014 Document Reviewed: 04/10/2013 [employer] Patient Information 2015 [place], [place]. This information is not intended to replace advice given to you by your health care provider. Make sure you discuss any questions you have with your health care  provider.

## 2014-08-30 NOTE — Progress Notes (Signed)
Pre visit review using our clinic review tool, if applicable. No additional management support is needed unless otherwise documented below in the visit note. 

## 2014-09-02 ENCOUNTER — Encounter: Payer: Self-pay | Admitting: Internal Medicine

## 2014-10-13 ENCOUNTER — Encounter (HOSPITAL_BASED_OUTPATIENT_CLINIC_OR_DEPARTMENT_OTHER): Payer: Self-pay | Admitting: *Deleted

## 2014-10-13 ENCOUNTER — Emergency Department (HOSPITAL_BASED_OUTPATIENT_CLINIC_OR_DEPARTMENT_OTHER): Payer: BC Managed Care – PPO

## 2014-10-13 ENCOUNTER — Emergency Department (HOSPITAL_BASED_OUTPATIENT_CLINIC_OR_DEPARTMENT_OTHER)
Admission: EM | Admit: 2014-10-13 | Discharge: 2014-10-14 | Disposition: A | Discharge status: Home / Self Care | Payer: BC Managed Care – PPO | Attending: Emergency Medicine | Admitting: Emergency Medicine

## 2014-10-13 DIAGNOSIS — Y998 Other external cause status: Secondary | ICD-10-CM | POA: Insufficient documentation

## 2014-10-13 DIAGNOSIS — F410 Panic disorder [episodic paroxysmal anxiety] without agoraphobia: Secondary | ICD-10-CM | POA: Insufficient documentation

## 2014-10-13 DIAGNOSIS — Z79899 Other long term (current) drug therapy: Secondary | ICD-10-CM | POA: Diagnosis not present

## 2014-10-13 DIAGNOSIS — W19XXXA Unspecified fall, initial encounter: Secondary | ICD-10-CM

## 2014-10-13 DIAGNOSIS — F419 Anxiety disorder, unspecified: Secondary | ICD-10-CM | POA: Diagnosis not present

## 2014-10-13 DIAGNOSIS — Z720 Tobacco use: Secondary | ICD-10-CM | POA: Insufficient documentation

## 2014-10-13 DIAGNOSIS — Y9389 Activity, other specified: Secondary | ICD-10-CM | POA: Insufficient documentation

## 2014-10-13 DIAGNOSIS — Y92481 Parking lot as the place of occurrence of the external cause: Secondary | ICD-10-CM | POA: Diagnosis not present

## 2014-10-13 DIAGNOSIS — W01198A Fall on same level from slipping, tripping and stumbling with subsequent striking against other object, initial encounter: Secondary | ICD-10-CM | POA: Insufficient documentation

## 2014-10-13 DIAGNOSIS — Z8619 Personal history of other infectious and parasitic diseases: Secondary | ICD-10-CM | POA: Insufficient documentation

## 2014-10-13 DIAGNOSIS — Z8709 Personal history of other diseases of the respiratory system: Secondary | ICD-10-CM | POA: Diagnosis not present

## 2014-10-13 DIAGNOSIS — R197 Diarrhea, unspecified: Secondary | ICD-10-CM | POA: Insufficient documentation

## 2014-10-13 DIAGNOSIS — Z880 Allergy status to penicillin: Secondary | ICD-10-CM | POA: Insufficient documentation

## 2014-10-13 DIAGNOSIS — S300XXA Contusion of lower back and pelvis, initial encounter: Secondary | ICD-10-CM | POA: Insufficient documentation

## 2014-10-13 DIAGNOSIS — M791 Myalgia: Secondary | ICD-10-CM | POA: Diagnosis not present

## 2014-10-13 DIAGNOSIS — S3992XA Unspecified injury of lower back, initial encounter: Secondary | ICD-10-CM | POA: Diagnosis not present

## 2014-10-13 DIAGNOSIS — M545 Low back pain, unspecified: Secondary | ICD-10-CM

## 2014-10-13 DIAGNOSIS — Z8719 Personal history of other diseases of the digestive system: Secondary | ICD-10-CM | POA: Insufficient documentation

## 2014-10-13 DIAGNOSIS — Z87440 Personal history of urinary (tract) infections: Secondary | ICD-10-CM | POA: Insufficient documentation

## 2014-10-13 DIAGNOSIS — Z8742 Personal history of other diseases of the female genital tract: Secondary | ICD-10-CM | POA: Insufficient documentation

## 2014-10-13 DIAGNOSIS — Z872 Personal history of diseases of the skin and subcutaneous tissue: Secondary | ICD-10-CM | POA: Insufficient documentation

## 2014-10-13 MED ORDER — HYDROMORPHONE HCL 1 MG/ML IJ SOLN
1.0000 mg | Freq: Once | INTRAMUSCULAR | Status: AC
  Administered 2014-10-13: 1 mg via INTRAVENOUS
  Filled 2014-10-13: qty 1

## 2014-10-13 MED ORDER — LORAZEPAM 2 MG/ML IJ SOLN
0.5000 mg | Freq: Once | INTRAMUSCULAR | Status: AC
  Administered 2014-10-13: 0.5 mg via INTRAVENOUS
  Filled 2014-10-13: qty 1

## 2014-10-13 MED ORDER — LORAZEPAM 1 MG PO TABS
0.5000 mg | ORAL_TABLET | Freq: Once | ORAL | Status: DC
  Filled 2014-10-13: qty 1

## 2014-10-13 NOTE — ED Notes (Addendum)
Arrived via [facility] c/o fall on Thursday in parking lot. States her pants were too long and her heel on shoe was broken and she tripped and fell onto concrete barrier that that you park against. C/o right knee abrasion and low back pain with shooting pains. Pt took oxycodone 30 min prior to being transported per EMS.

## 2014-10-13 NOTE — ED Notes (Signed)
Pt continues to refuse to go to x-ray d/t her pain.

## 2014-10-13 NOTE — ED Provider Notes (Signed)
CSN: [id number]     Arrival date & time 10/13/14  2054 History  This chart was scribed for Quintella Reichert, MD by Tula Nakayama, ED Scribe. This patient was seen in room MH05/MH05 and the patient's care was started at 9:10 PM.    Chief Complaint  Patient presents with  . Fall  . Back Pain   The history is provided by the patient. No language interpreter was used.   HPI Comments: Gina [name] is a 38 y.o. female with a history of fibromyalgia, hysterectomy, degenerative disc disease, endometriosis, arthritis and hernia who presents to the Emergency Department complaining of constant, gradually worsening, sharp, shooting right-sided lower back pain that started 3 days ago. Pain shoots just in the right back, it does not radiate.  She states she tripped on her pants onto her right sacrum and lower back on a concrete bumper in a parking lot. Pt notes abrasions on her right knee, bruising on her lower back and buttocks, a knot on her right shoulder, urgency and loose stools as associated symptoms. Pt also notes current stiff, throbbing generalized pain that is consistent with her history of fibromyalgia, but states that lower back pain is different. She has tried muscle relaxants with no relief. Pt reports that she could not walk the day of the fall because of pain, but that she was able to ambulate with medication for the last two days. She takes oxycodone-10 mg every 4 hours, Tylenol, Robaxin, Lexapro and Xanax daily. She is on a pain contract. Pt smokes cigarettes, but denies EtOH or drug abuse. She denies SI/HI, malodorous urine, dysuria, fever and vomiting as associated symptoms.   Past Medical History  Diagnosis Date  . ABDOMINAL WALL HERNIA 02/27/2010  . ALLERGIC RHINITIS 06/02/2007  . ANOREXIA, CHRONIC 08/07/2008  . ANXIETY 09/15/2010  . BACK PAIN, THORACIC REGION 07/07/2007  . CERVICAL RADICULOPATHY 12/11/2008  . Condyloma acuminatum 04/23/2009  . CONSTIPATION 09/15/2010  . DYSPHAGIA UNSPECIFIED  09/09/2009  . 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 ENDOMETRIOSIS 12/16/2009  . FATIGUE 06/11/2010  . GERD 10/23/2009  . HERPES SIMPLEX INFECTION 06/11/2010  . LENTIGO 04/23/2009  . LUNG NODULE 06/17/2010  . Palpitations 10/23/2009  . Stricture and stenosis of esophagus 05/13/2010  . TOBACCO ABUSE 08/07/2008  . Heart palpitations   . Urinary tract infection   . Rheumatoid arthritis(714.0)   . Ovarian cyst   . Abnormal Pap smear   . Fibromyalgia   . Narcotic abuse, continuous   . Panic disorder    Past Surgical History  Procedure Laterality Date  . Partial hysterectomy    . Endometrial ablation    . Hernia repair      X3  . Dilation and curettage of uterus      x1   . Cesarean section      x2   . Esophageal dilatation x 4    . Gum surgery    . Abdominal surgery    . Abdominal wall mesh  removal     Family History  Problem Relation Age of Onset  . Depression Mother   . Arthritis Mother   . Hyperlipidemia Father   . Hypertension Father    History  Substance Use Topics  . Smoking status: Current Every Day Smoker -- 0.50 packs/day    Types: Cigarettes  . Smokeless tobacco: Never Used  . Alcohol Use: No   OB History    Gravida Para Term Preterm AB TAB SAB Ectopic Multiple Living   3 2 2  0  1 0 1 0 0 2     Review of Systems  Constitutional: Negative for fever.  Gastrointestinal: Positive for diarrhea. Negative for vomiting.  Genitourinary: Positive for urgency. Negative for dysuria and difficulty urinating.  Musculoskeletal: Positive for back pain and arthralgias.  Skin: Positive for wound.       Bruise on lower back; abrasion on right knee  Psychiatric/Behavioral: Negative for suicidal ideas and self-injury.  All other systems reviewed and are negative.  Allergies  Lamictal; Nicotine; Zofran; Chantix; Codeine; Ibuprofen; Metaxalone; Morphine and related; Ondansetron; Skelaxin; Sulfa antibiotics; Toradol; Varenicline; Amoxicillin; and Penicillins  Home Medications    Prior to Admission medications   Medication Sig Start Date End Date Taking? Authorizing Provider  acetaminophen (TYLENOL) 325 MG tablet Take 325 mg by mouth every 4 (four) hours as needed for pain.    Yes Historical Provider, MD  ALPRAZolam Duanne Moron) 1 MG tablet Take 1 mg by mouth 4 (four) times daily as needed for anxiety.   Yes Historical Provider, MD  escitalopram (LEXAPRO) 20 MG tablet Take 20 mg by mouth daily.    Yes Historical Provider, MD  methocarbamol (ROBAXIN) 500 MG tablet Take 500 mg by mouth 3 (three) times daily as needed for muscle spasms.  05/01/13  Yes Hollace Kinnier Sofia, PA-C  Norethin Ace-Eth Estrad-FE (MINASTRIN 24 FE PO) Take by mouth. Take one by mouth daily.   Yes Historical Provider, MD  oxyCODONE-acetaminophen (PERCOCET) 10-325 MG per tablet Take 1 tablet by mouth every 6 (six) hours as needed for pain.   Yes Historical Provider, MD  azithromycin (ZITHROMAX) 250 MG tablet Day 1 take 2 pills, days 2-5 take 1 pill per day. 08/30/14   Olga Millers, MD  benzonatate (TESSALON) 100 MG capsule Take 1 capsule (100 mg total) by mouth 3 (three) times daily as needed for cough. 08/30/14   Olga Millers, MD  zolpidem (AMBIEN) 5 MG tablet Take 5 mg by mouth at bedtime as needed for sleep.    Historical Provider, MD   BP 100/57 mmHg  Pulse 66  Temp(Src) 99.3 F (37.4 C) (Oral)  Resp 20  Ht 5\' 7"  (1.702 m)  Wt 175 lb (79.379 kg)  BMI 27.40 kg/m2  SpO2 100% Physical Exam  Constitutional: She is oriented to person, place, and time. She appears well-developed and well-nourished.  Uncomfortable appearing.   HENT:  Head: Normocephalic and atraumatic.  Cardiovascular: Normal rate and regular rhythm.   No murmur heard. Pulmonary/Chest: Effort normal and breath sounds normal. No respiratory distress.  Abdominal: Soft. There is no tenderness. There is no rebound and no guarding.  Musculoskeletal: She exhibits no edema.  ttp over lower lumbar spine, predominantly on the right  lower area.  Small area of ecchymosis to the right upper gluteal region. Position of comfort is lying on right side with rotation of torso.  Able to move both legs.  Sensation to light touch intact in bilateral legs, subjectively decreased.    Neurological: She is alert and oriented to person, place, and time.  Skin: Skin is warm and dry.  Psychiatric: She has a normal mood and affect. Her behavior is normal.  Nursing note and vitals reviewed.   ED Course  Procedures (including critical care time) DIAGNOSTIC STUDIES: Oxygen Saturation is 100% on RA, normal by my interpretation.    COORDINATION OF CARE: 9:26 PM Discussed treatment plan with pt and pt agreed to plan.  Labs Review Labs Reviewed - No data to display  Imaging Review No  results found.   EKG Interpretation None      MDM   Final diagnoses:  Fall  Right-sided low back pain without sciatica    Pt with chronic pain syndrome here with acute pain following a fall three days ago.  On initial evaluation pt stated that she could not have IM or PO meds and they had to be IV.  Pt is requesting IV phenergan, dilaudid, and ativan.  D/w pt that I do not feel that IV phenergan is indicated and offered PO, IM, or suppository and pt refused.  Pt refusing xray without multiple doses of pain medications.   Clinical picture not c/w cauda equina or epidural abscess.  Pt improved in department after pain meds.  Discussed pcp followup.    I personally performed the services described in this documentation, which was scribed in my presence. The recorded information has been reviewed and is accurate.    Quintella Reichert, MD 10/14/14 [phone]

## 2014-10-13 NOTE — ED Notes (Signed)
Pt refusing PO ativan, states she will vomit if she takes anything PO.

## 2014-10-14 NOTE — Discharge Instructions (Signed)
Back Pain, Adult Low back pain is very common. About 1 in 5 people have back pain.The cause of low back pain is rarely dangerous. The pain often gets better over time.About half of people with a sudden onset of back pain feel better in just 2 weeks. About 8 in 10 people feel better by 6 weeks.  CAUSES Some common causes of back pain include:  Strain of the muscles or ligaments supporting the spine.  Wear and tear (degeneration) of the spinal discs.  Arthritis.  Direct injury to the back. DIAGNOSIS Most of the time, the direct cause of low back pain is not known.However, back pain can be treated effectively even when the exact cause of the pain is unknown.Answering your caregiver's questions about your overall health and symptoms is one of the most accurate ways to make sure the cause of your pain is not dangerous. If your caregiver needs more information, he or she may order lab work or imaging tests (X-rays or MRIs).However, even if imaging tests show changes in your back, this usually does not require surgery. HOME CARE INSTRUCTIONS For many people, back pain returns.Since low back pain is rarely dangerous, it is often a condition that people can learn to manageon their own.   Remain active. It is stressful on the back to sit or stand in one place. Do not sit, drive, or stand in one place for more than 30 minutes at a time. Take short walks on level surfaces as soon as pain allows.Try to increase the length of time you walk each day.  Do not stay in bed.Resting more than 1 or 2 days can delay your recovery.  Do not avoid exercise or work.Your body is made to move.It is not dangerous to be active, even though your back may hurt.Your back will likely heal faster if you return to being active before your pain is gone.  Pay attention to your body when you bend and lift. Many people have less discomfortwhen lifting if they bend their knees, keep the load close to their bodies,and  avoid twisting. Often, the most comfortable positions are those that put less stress on your recovering back.  Find a comfortable position to sleep. Use a firm mattress and lie on your side with your knees slightly bent. If you lie on your back, put a pillow under your knees.  Only take over-the-counter or prescription medicines as directed by your caregiver. Over-the-counter medicines to reduce pain and inflammation are often the most helpful.Your caregiver may prescribe muscle relaxant drugs.These medicines help dull your pain so you can more quickly return to your normal activities and healthy exercise.  Put ice on the injured area.  Put ice in a plastic bag.  Place a towel between your skin and the bag.  Leave the ice on for 15-20 minutes, 03-04 times a day for the first 2 to 3 days. After that, ice and heat may be alternated to reduce pain and spasms.  Ask your caregiver about trying back exercises and gentle massage. This may be of some benefit.  Avoid feeling anxious or stressed.Stress increases muscle tension and can worsen back pain.It is important to recognize when you are anxious or stressed and learn ways to manage it.Exercise is a great option. SEEK MEDICAL CARE IF:  You have pain that is not relieved with rest or medicine.  You have pain that does not improve in 1 week.  You have new symptoms.  You are generally not feeling well. SEEK   IMMEDIATE MEDICAL CARE IF:   You have pain that radiates from your back into your legs.  You develop new bowel or bladder control problems.  You have unusual weakness or numbness in your arms or legs.  You develop nausea or vomiting.  You develop abdominal pain.  You feel faint. Document Released: 10/18/2005 Document Revised: 04/18/2012 Document Reviewed: 02/19/2014 [employer] Patient Information 2015 [employer]. This information is not intended to replace advice given to you by your health care provider. Make sure you  discuss any questions you have with your health care provider.  

## 2014-10-14 NOTE — ED Notes (Signed)
Pt ambulated in room without difficulty. States she feels much better. States nausea has improved and  Able to tolerated coffee.

## 2014-10-14 NOTE — ED Notes (Signed)
Patient transported to X-ray 

## 2014-11-15 ENCOUNTER — Encounter (HOSPITAL_COMMUNITY): Payer: Self-pay | Admitting: *Deleted

## 2014-11-15 ENCOUNTER — Emergency Department (HOSPITAL_COMMUNITY)
Admission: EM | Admit: 2014-11-15 | Discharge: 2014-11-16 | Disposition: A | Discharge status: Home / Self Care | Payer: BLUE CROSS/BLUE SHIELD | Attending: Emergency Medicine | Admitting: Emergency Medicine

## 2014-11-15 ENCOUNTER — Emergency Department (HOSPITAL_COMMUNITY): Payer: BLUE CROSS/BLUE SHIELD

## 2014-11-15 DIAGNOSIS — Z880 Allergy status to penicillin: Secondary | ICD-10-CM | POA: Insufficient documentation

## 2014-11-15 DIAGNOSIS — Z8619 Personal history of other infectious and parasitic diseases: Secondary | ICD-10-CM | POA: Insufficient documentation

## 2014-11-15 DIAGNOSIS — Z793 Long term (current) use of hormonal contraceptives: Secondary | ICD-10-CM | POA: Insufficient documentation

## 2014-11-15 DIAGNOSIS — Z79899 Other long term (current) drug therapy: Secondary | ICD-10-CM | POA: Diagnosis not present

## 2014-11-15 DIAGNOSIS — Z90710 Acquired absence of both cervix and uterus: Secondary | ICD-10-CM | POA: Diagnosis not present

## 2014-11-15 DIAGNOSIS — Z87440 Personal history of urinary (tract) infections: Secondary | ICD-10-CM | POA: Diagnosis not present

## 2014-11-15 DIAGNOSIS — Z720 Tobacco use: Secondary | ICD-10-CM | POA: Insufficient documentation

## 2014-11-15 DIAGNOSIS — Z9889 Other specified postprocedural states: Secondary | ICD-10-CM | POA: Insufficient documentation

## 2014-11-15 DIAGNOSIS — F410 Panic disorder [episodic paroxysmal anxiety] without agoraphobia: Secondary | ICD-10-CM | POA: Insufficient documentation

## 2014-11-15 DIAGNOSIS — Z872 Personal history of diseases of the skin and subcutaneous tissue: Secondary | ICD-10-CM | POA: Diagnosis not present

## 2014-11-15 DIAGNOSIS — M1990 Unspecified osteoarthritis, unspecified site: Secondary | ICD-10-CM | POA: Insufficient documentation

## 2014-11-15 DIAGNOSIS — Z8742 Personal history of other diseases of the female genital tract: Secondary | ICD-10-CM | POA: Diagnosis not present

## 2014-11-15 DIAGNOSIS — R109 Unspecified abdominal pain: Secondary | ICD-10-CM

## 2014-11-15 DIAGNOSIS — R52 Pain, unspecified: Secondary | ICD-10-CM

## 2014-11-15 DIAGNOSIS — K5900 Constipation, unspecified: Secondary | ICD-10-CM | POA: Insufficient documentation

## 2014-11-15 DIAGNOSIS — M797 Fibromyalgia: Secondary | ICD-10-CM | POA: Insufficient documentation

## 2014-11-15 LAB — URINALYSIS, ROUTINE W REFLEX MICROSCOPIC
BILIRUBIN URINE: NEGATIVE
Glucose, UA: NEGATIVE mg/dL
HGB URINE DIPSTICK: NEGATIVE
KETONES UR: NEGATIVE mg/dL
Leukocytes, UA: NEGATIVE
Nitrite: NEGATIVE
PROTEIN: NEGATIVE mg/dL
Specific Gravity, Urine: 1.015 (ref 1.005–1.030)
Urobilinogen, UA: 0.2 mg/dL (ref 0.0–1.0)
pH: 5.5 (ref 5.0–8.0)

## 2014-11-15 LAB — COMPREHENSIVE METABOLIC PANEL
ALBUMIN: 4.5 g/dL (ref 3.5–5.2)
ALT: 14 U/L (ref 0–35)
ANION GAP: 6 (ref 5–15)
AST: 19 U/L (ref 0–37)
Alkaline Phosphatase: 70 U/L (ref 39–117)
BUN: 12 mg/dL (ref 6–23)
CO2: 25 mmol/L (ref 19–32)
Calcium: 8.9 mg/dL (ref 8.4–10.5)
Chloride: 107 mEq/L (ref 96–112)
Creatinine, Ser: 0.58 mg/dL (ref 0.50–1.10)
GFR calc Af Amer: >90 mL/min (ref >90)
GFR calc non Af Amer: >90 mL/min (ref >90)
GLUCOSE: 118 mg/dL — AB (ref 70–99)
POTASSIUM: 3.6 mmol/L (ref 3.5–5.1)
Sodium: 138 mmol/L (ref 135–145)
Total Bilirubin: 0.6 mg/dL (ref 0.3–1.2)
Total Protein: 6.9 g/dL (ref 6.0–8.3)

## 2014-11-15 LAB — LIPASE, BLOOD: Lipase: 21 U/L (ref 11–59)

## 2014-11-15 MED ORDER — OXYCODONE-ACETAMINOPHEN 5-325 MG PO TABS
1.0000 | ORAL_TABLET | Freq: Once | ORAL | Status: DC
  Filled 2014-11-15 (×2): qty 1

## 2014-11-15 NOTE — ED Notes (Addendum)
Patient will not extend arm for me to draw blood. Patient texting on cell phone as to reason why she wouldn't extend her arm.  Patient wanting ":her cigarettes and coat" before we do anything else.

## 2014-11-15 NOTE — ED Notes (Signed)
Bed: GQ36 Expected date:  Expected time:  Means of arrival:  Comments: EMS overdose

## 2014-11-15 NOTE — ED Notes (Signed)
PT states that she began to have lower abd pain approx an hour ago; pt states that it radiates toward the area where she has had a hernia that has ruptured 3 times; pt yelling and screaming about arriving by EMS and being placed in a wheelchair; pt c/o N/V; pt states that she has had dry heaves since this happened; pt states that she was giving her self an enema prior to the beginning of the pain; pt states "I feel like something has ruptured on the inside"

## 2014-11-15 NOTE — ED Notes (Signed)
Pt refused Percocet at this time; pt states "I want an IV and I want IV pain meds"

## 2014-11-16 LAB — CBC WITH DIFFERENTIAL/PLATELET
BASOS ABS: 0 10*3/uL (ref 0.0–0.1)
BASOS PCT: 0 % (ref 0–1)
Eosinophils Absolute: 0.2 10*3/uL (ref 0.0–0.7)
Eosinophils Relative: 3 % (ref 0–5)
HCT: 42.9 % (ref 36.0–46.0)
Hemoglobin: 14.6 g/dL (ref 12.0–15.0)
LYMPHS ABS: 3.2 10*3/uL (ref 0.7–4.0)
Lymphocytes Relative: 44 % (ref 12–46)
MCH: 31.5 pg (ref 26.0–34.0)
MCHC: 34 g/dL (ref 30.0–36.0)
MCV: 92.7 fL (ref 78.0–100.0)
Monocytes Absolute: 0.5 10*3/uL (ref 0.1–1.0)
Monocytes Relative: 7 % (ref 3–12)
Neutro Abs: 3.3 10*3/uL (ref 1.7–7.7)
Neutrophils Relative %: 46 % (ref 43–77)
Platelets: 204 10*3/uL (ref 150–400)
RBC: 4.63 MIL/uL (ref 3.87–5.11)
RDW: 12.3 % (ref 11.5–15.5)
WBC: 7.1 10*3/uL (ref 4.0–10.5)

## 2014-11-16 MED ORDER — POLYETHYLENE GLYCOL 3350 17 G PO PACK: 17.0000 g | PACK | Freq: Every day | ORAL | Status: DC

## 2014-11-16 MED ORDER — DICYCLOMINE HCL 20 MG PO TABS: 20.0000 mg | ORAL_TABLET | Freq: Two times a day (BID) | ORAL | Status: DC | PRN

## 2014-11-16 MED ORDER — KETOROLAC TROMETHAMINE 60 MG/2ML IM SOLN
60.0000 mg | Freq: Once | INTRAMUSCULAR | Status: DC
  Filled 2014-11-16: qty 2

## 2014-11-16 MED ORDER — METOCLOPRAMIDE HCL 10 MG PO TABS
10.0000 mg | ORAL_TABLET | Freq: Once | ORAL | Status: DC
  Filled 2014-11-16: qty 1

## 2014-11-16 NOTE — ED Notes (Signed)
MD at bedside. 

## 2014-11-16 NOTE — ED Notes (Signed)
Pt refusing to allow staff to perform discharge vitals. Pt states that she is "leaving this hospital in 5 minutes". This RN asked the pt to please wait for the doctor to print paperwork however she left prior to the paperwork being ready and without signing out with this RN.

## 2014-11-16 NOTE — ED Notes (Addendum)
About to give Toradol IM when Pt. REFUSED , claimed that that med will NOT help her unless she's given Phenergan. Pt. Also refused Reglan tab.

## 2014-11-16 NOTE — Discharge Instructions (Signed)
Abdominal Pain, Women °Abdominal (stomach, pelvic, or belly) pain can be caused by many things. It is important to tell your doctor: °· The location of the pain. °· Does it come and go or is it present all the time? °· Are there things that start the pain (eating certain foods, exercise)? °· Are there other symptoms associated with the pain (fever, nausea, vomiting, diarrhea)? °All of this is helpful to know when trying to find the cause of the pain. °CAUSES  °· Stomach: virus or bacteria infection, or ulcer. °· Intestine: appendicitis (inflamed appendix), regional ileitis (Crohn's disease), ulcerative colitis (inflamed colon), irritable bowel syndrome, diverticulitis (inflamed diverticulum of the colon), or cancer of the stomach or intestine. °· Gallbladder disease or stones in the gallbladder. °· Kidney disease, kidney stones, or infection. °· Pancreas infection or cancer. °· Fibromyalgia (pain disorder). °· Diseases of the female organs: °¨ Uterus: fibroid (non-cancerous) tumors or infection. °¨ Fallopian tubes: infection or tubal pregnancy. °¨ Ovary: cysts or tumors. °¨ Pelvic adhesions (scar tissue). °¨ Endometriosis (uterus lining tissue growing in the pelvis and on the pelvic organs). °¨ Pelvic congestion syndrome (female organs filling up with blood just before the menstrual period). °¨ Pain with the menstrual period. °¨ Pain with ovulation (producing an egg). °¨ Pain with an IUD (intrauterine device, birth control) in the uterus. °¨ Cancer of the female organs. °· Functional pain (pain not caused by a disease, may improve without treatment). °· Psychological pain. °· Depression. °DIAGNOSIS  °Your doctor will decide the seriousness of your pain by doing an examination. °· Blood tests. °· X-rays. °· Ultrasound. °· CT scan (computed tomography, special type of X-ray). °· MRI (magnetic resonance imaging). °· Cultures, for infection. °· Barium enema (dye inserted in the large intestine, to better view it with  X-rays). °· Colonoscopy (looking in intestine with a lighted tube). °· Laparoscopy (minor surgery, looking in abdomen with a lighted tube). °· Major abdominal exploratory surgery (looking in abdomen with a large incision). °TREATMENT  °The treatment will depend on the cause of the pain.  °· Many cases can be observed and treated at home. °· Over-the-counter medicines recommended by your caregiver. °· Prescription medicine. °· Antibiotics, for infection. °· Birth control pills, for painful periods or for ovulation pain. °· Hormone treatment, for endometriosis. °· Nerve blocking injections. °· Physical therapy. °· Antidepressants. °· Counseling with a psychologist or psychiatrist. °· Minor or major surgery. °HOME CARE INSTRUCTIONS  °· Do not take laxatives, unless directed by your caregiver. °· Take over-the-counter pain medicine only if ordered by your caregiver. Do not take aspirin because it can cause an upset stomach or bleeding. °· Try a clear liquid diet (broth or water) as ordered by your caregiver. Slowly move to a bland diet, as tolerated, if the pain is related to the stomach or intestine. °· Have a thermometer and take your temperature several times a day, and record it. °· Bed rest and sleep, if it helps the pain. °· Avoid sexual intercourse, if it causes pain. °· Avoid stressful situations. °· Keep your follow-up appointments and tests, as your caregiver orders. °· If the pain does not go away with medicine or surgery, you may try: °¨ Acupuncture. °¨ Relaxation exercises (yoga, meditation). °¨ Group therapy. °¨ Counseling. °SEEK MEDICAL CARE IF:  °· You notice certain foods cause stomach pain. °· Your home care treatment is not helping your pain. °· You need stronger pain medicine. °· You want your IUD removed. °· You feel faint or   lightheaded. °· You develop nausea and vomiting. °· You develop a rash. °· You are having side effects or an allergy to your medicine. °SEEK IMMEDIATE MEDICAL CARE IF:  °· Your  pain does not go away or gets worse. °· You have a fever. °· Your pain is felt only in portions of the abdomen. The right side could possibly be appendicitis. The left lower portion of the abdomen could be colitis or diverticulitis. °· You are passing blood in your stools (bright red or black tarry stools, with or without vomiting). °· You have blood in your urine. °· You develop chills, with or without a fever. °· You pass out. °MAKE SURE YOU:  °· Understand these instructions. °· Will watch your condition. °· Will get help right away if you are not doing well or get worse. °Document Released: 08/15/2007 Document Revised: 03/04/2014 Document Reviewed: 09/04/2009 °Exit[employer]® Patient Information ©2015 [employer]. This information is not intended to replace advice given to you by your health care provider. Make sure you discuss any questions you have with your health care provider. ° °Constipation °Constipation is when a person has fewer than three bowel movements a week, has difficulty having a bowel movement, or has stools that are dry, hard, or larger than normal. As people grow older, constipation is more common. If you try to fix constipation with medicines that make you have a bowel movement (laxatives), the problem may get worse. Long-term laxative use may cause the muscles of the colon to become weak. A low-fiber diet, not taking in enough fluids, and taking certain medicines may make constipation worse.  °CAUSES  °· Certain medicines, such as antidepressants, pain medicine, iron supplements, antacids, and water pills.   °· Certain diseases, such as diabetes, irritable bowel syndrome (IBS), thyroid disease, or depression.   °· Not drinking enough water.   °· Not eating enough fiber-rich foods.   °· Stress or travel.   °· Lack of physical activity or exercise.   °· Ignoring the urge to have a bowel movement.   °· Using laxatives too much.   °SIGNS AND SYMPTOMS  °· Having fewer than three bowel movements a  week.   °· Straining to have a bowel movement.   °· Having stools that are hard, dry, or larger than normal.   °· Feeling full or bloated.   °· Pain in the lower abdomen.   °· Not feeling relief after having a bowel movement.   °DIAGNOSIS  °Your health care provider will take a medical history and perform a physical exam. Further testing may be done for severe constipation. Some tests may include: °· A barium enema X-ray to examine your rectum, colon, and, sometimes, your small intestine.   °· A sigmoidoscopy to examine your lower colon.   °· A colonoscopy to examine your entire colon. °TREATMENT  °Treatment will depend on the severity of your constipation and what is causing it. Some dietary treatments include drinking more fluids and eating more fiber-rich foods. Lifestyle treatments may include regular exercise. If these diet and lifestyle recommendations do not help, your health care provider may recommend taking over-the-counter laxative medicines to help you have bowel movements. Prescription medicines may be prescribed if over-the-counter medicines do not work.  °HOME CARE INSTRUCTIONS  °· Eat foods that have a lot of fiber, such as fruits, vegetables, whole grains, and beans. °· Limit foods high in fat and processed sugars, such as french fries, hamburgers, cookies, candies, and soda.   °· A fiber supplement may be added to your diet if you cannot get enough fiber from foods.   °·   Drink enough fluids to keep your urine clear or pale yellow.   °· Exercise regularly or as directed by your health care provider.   °· Go to the restroom when you have the urge to go. Do not hold it.   °· Only take over-the-counter or prescription medicines as directed by your health care provider. Do not take other medicines for constipation without talking to your health care provider first.   °SEEK IMMEDIATE MEDICAL CARE IF:  °· You have bright red blood in your stool.   °· Your constipation lasts for more than 4 days or gets  worse.   °· You have abdominal or rectal pain.   °· You have thin, pencil-like stools.   °· You have unexplained weight loss. °MAKE SURE YOU:  °· Understand these instructions. °· Will watch your condition. °· Will get help right away if you are not doing well or get worse. °Document Released: 07/16/2004 Document Revised: 10/23/2013 Document Reviewed: 07/30/2013 °[employer]® Patient Information ©2015 [employer]. This information is not intended to replace advice given to you by your health care provider. Make sure you discuss any questions you have with your health care provider. ° °

## 2014-11-16 NOTE — ED Provider Notes (Signed)
CSN: [id number]     Arrival date & time 11/15/14  2121 History   First MD Initiated Contact with Patient 11/16/14 0102     Chief Complaint  Patient presents with  . Abdominal Pain     (Consider location/radiation/quality/duration/timing/severity/associated sxs/prior Treatment) HPI Patient with history of chronic pain and drug-seeking behavior presents with abdominal pain that started this evening around 8 PM. Patient has chronic constipation and states she gives herself regular enemas. Gave herself an enema and had a bowel movement. No blood in the stool. Began having cramping sharp lower abdominal pains after. Pain is episodic. No fever or chills. No nausea or vomiting. Patient states she has perennial and rectal swelling and is concerned she may have "ripped something".  Past Medical History  Diagnosis Date  . ABDOMINAL WALL HERNIA 02/27/2010  . ALLERGIC RHINITIS 06/02/2007  . ANOREXIA, CHRONIC 08/07/2008  . ANXIETY 09/15/2010  . BACK PAIN, THORACIC REGION 07/07/2007  . CERVICAL RADICULOPATHY 12/11/2008  . Condyloma acuminatum 04/23/2009  . CONSTIPATION 09/15/2010  . DYSPHAGIA UNSPECIFIED 09/09/2009  . 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 ENDOMETRIOSIS 12/16/2009  . FATIGUE 06/11/2010  . GERD 10/23/2009  . HERPES SIMPLEX INFECTION 06/11/2010  . LENTIGO 04/23/2009  . LUNG NODULE 06/17/2010  . Palpitations 10/23/2009  . Stricture and stenosis of esophagus 05/13/2010  . TOBACCO ABUSE 08/07/2008  . Heart palpitations   . Urinary tract infection   . Rheumatoid arthritis(714.0)   . Ovarian cyst   . Abnormal Pap smear   . Fibromyalgia   . Narcotic abuse, continuous   . Panic disorder    Past Surgical History  Procedure Laterality Date  . Partial hysterectomy    . Endometrial ablation    . Hernia repair      X3  . Dilation and curettage of uterus      x1   . Cesarean section      x2   . Esophageal dilatation x 4    . Gum surgery    . Abdominal surgery    . Abdominal wall mesh  removal      Family History  Problem Relation Age of Onset  . Depression Mother   . Arthritis Mother   . Hyperlipidemia Father   . Hypertension Father    History  Substance Use Topics  . Smoking status: Current Every Day Smoker -- 0.50 packs/day    Types: Cigarettes  . Smokeless tobacco: Never Used  . Alcohol Use: No   OB History    Gravida Para Term Preterm AB TAB SAB Ectopic Multiple Living   3 2 2  0 1 0 1 0 0 2     Review of Systems  Constitutional: Negative for fever and chills.  Respiratory: Negative for shortness of breath.   Cardiovascular: Negative for chest pain.  Gastrointestinal: Positive for abdominal pain, constipation and rectal pain. Negative for nausea, vomiting, diarrhea and blood in stool.  Genitourinary: Negative for dysuria, frequency, flank pain and pelvic pain.  Musculoskeletal: Negative for myalgias, back pain, neck pain and neck stiffness.  Skin: Negative for rash and wound.  Neurological: Negative for dizziness, weakness, light-headedness, numbness and headaches.  All other systems reviewed and are negative.     Allergies  Lamictal; Nicotine; Zofran; Chantix; Codeine; Ibuprofen; Metaxalone; Morphine and related; Ondansetron; Skelaxin; Sulfa antibiotics; Toradol; Varenicline; Amoxicillin; and Penicillins  Home Medications   Prior to Admission medications   Medication Sig Start Date End Date Taking? Authorizing Provider  acetaminophen (TYLENOL) 325 MG tablet Take 325 mg by  mouth every 4 (four) hours as needed for pain.    Yes Historical Provider, MD  ALPRAZolam Duanne Moron) 1 MG tablet Take 1 mg by mouth 4 (four) times daily as needed for anxiety.   Yes Historical Provider, MD  escitalopram (LEXAPRO) 20 MG tablet Take 20 mg by mouth daily.    Yes Historical Provider, MD  Norethin Ace-Eth Estrad-FE (MINASTRIN 24 FE PO) Take by mouth. Take one by mouth daily.   Yes Historical Provider, MD  zolpidem (AMBIEN) 5 MG tablet Take 5 mg by mouth at bedtime as needed for  sleep.   Yes Historical Provider, MD  azithromycin (ZITHROMAX) 250 MG tablet Day 1 take 2 pills, days 2-5 take 1 pill per day. Patient not taking: Reported on 11/15/2014 08/30/14   Olga Millers, MD  benzonatate (TESSALON) 100 MG capsule Take 1 capsule (100 mg total) by mouth 3 (three) times daily as needed for cough. Patient not taking: Reported on 11/15/2014 08/30/14   Olga Millers, MD  methocarbamol (ROBAXIN) 500 MG tablet Take 500 mg by mouth 3 (three) times daily as needed for muscle spasms.  05/01/13   Fransico Meadow, PA-C  oxyCODONE-acetaminophen (PERCOCET) 10-325 MG per tablet Take 1 tablet by mouth every 6 (six) hours as needed for pain.    Historical Provider, MD   BP 101/67 mmHg  Pulse 80  Temp(Src) 99.1 F (37.3 C) (Oral)  Resp 20  SpO2 96% Physical Exam  Constitutional: She is oriented to person, place, and time. She appears well-developed and well-nourished. No distress.  Patient in no distress resting in room.  HENT:  Head: Normocephalic and atraumatic.  Mouth/Throat: Oropharynx is clear and moist.  Eyes: EOM are normal. Pupils are equal, round, and reactive to light.  Neck: Normal range of motion. Neck supple.  Cardiovascular: Normal rate and regular rhythm.   Pulmonary/Chest: Effort normal and breath sounds normal. No respiratory distress. She has no wheezes. She has no rales. She exhibits no tenderness.  Abdominal: Soft. Bowel sounds are normal. She exhibits distension. She exhibits no mass. There is no tenderness. There is no rebound and no guarding.  Genitourinary:  Patient refusing digital rectal exam. External hemorrhoid noted on observation. No obvious bleeding.  Musculoskeletal: Normal range of motion. She exhibits no edema or tenderness.  No CVA tenderness bilaterally.  Neurological: She is alert and oriented to person, place, and time.  Skin: Skin is warm and dry. No rash noted. No erythema.  Psychiatric: She has a normal mood and affect. Her behavior  is normal.  Nursing note and vitals reviewed.   ED Course  Procedures (including critical care time) Labs Review Labs Reviewed  COMPREHENSIVE METABOLIC PANEL - Abnormal; Notable for the following:    Glucose, Bld 118 (*)    All other components within normal limits  URINALYSIS, ROUTINE W REFLEX MICROSCOPIC - Abnormal; Notable for the following:    APPearance CLOUDY (*)    All other components within normal limits  CBC WITH DIFFERENTIAL  LIPASE, BLOOD    Imaging Review Dg Abd 2 Views  11/15/2014   CLINICAL DATA:  Acute onset severe lower abdominal pain approximately 1 hr ago at which time the patient was given course of an enema. Prior abdominal wall hernia repair.  EXAM: ABDOMEN - 2 VIEW  COMPARISON:  CT abdomen and pelvis 10/25/2012.  FINDINGS: Bowel gas pattern unremarkable without evidence of obstruction or significant ileus. No evidence of free air or significant air-fluid levels on the erect image. Moderate to  large stool burden in the colon, particularly the cecum which extends low in the right side of the pelvis as noted on the prior CT. Phleboliths low in both sides of the pelvis. No opaque urinary tract calculi. Regional skeleton intact.  IMPRESSION: No acute abdominal abnormality.  Moderate to large stool burden.   Electronically Signed   By: Evangeline Dakin M.D.   On: 11/15/2014 22:48     EKG Interpretation None      MDM   Final diagnoses:  Pain    Patient demanding IV antibiotics before further examination can be done. I offered oral medication or IM Toradol. Patient states that she only has nausea with Toradol. Have offered antiemetic. Abdominal exam is benign. Patient has had multiple radiographic procedures in the past and I do not feel that is indicated at this point given her exam and normal labs. Plain x-rays show moderate to large stool burden.  Patient's refusing medications as offered. Abdomen remained soft and benign. Vital signs stable. I do not believe  that further testing is necessary. I advised patient to take MiraLAX daily and to follow-up with her primary physician. Return precautions have been given.  Julianne Rice, MD 11/16/14 [phone]

## 2014-11-20 ENCOUNTER — Encounter (HOSPITAL_COMMUNITY): Payer: Self-pay | Admitting: *Deleted

## 2014-11-20 ENCOUNTER — Emergency Department (HOSPITAL_COMMUNITY)
Admission: EM | Admit: 2014-11-20 | Discharge: 2014-11-20 | Disposition: A | Discharge status: Home / Self Care | Payer: BLUE CROSS/BLUE SHIELD | Attending: Emergency Medicine | Admitting: Emergency Medicine

## 2014-11-20 ENCOUNTER — Telehealth: Payer: Self-pay | Admitting: Geriatric Medicine

## 2014-11-20 ENCOUNTER — Telehealth: Payer: Self-pay | Admitting: Internal Medicine

## 2014-11-20 DIAGNOSIS — Z79899 Other long term (current) drug therapy: Secondary | ICD-10-CM | POA: Insufficient documentation

## 2014-11-20 DIAGNOSIS — Z9889 Other specified postprocedural states: Secondary | ICD-10-CM | POA: Insufficient documentation

## 2014-11-20 DIAGNOSIS — Z8619 Personal history of other infectious and parasitic diseases: Secondary | ICD-10-CM | POA: Diagnosis not present

## 2014-11-20 DIAGNOSIS — Z8742 Personal history of other diseases of the female genital tract: Secondary | ICD-10-CM | POA: Diagnosis not present

## 2014-11-20 DIAGNOSIS — Z87440 Personal history of urinary (tract) infections: Secondary | ICD-10-CM | POA: Insufficient documentation

## 2014-11-20 DIAGNOSIS — Z720 Tobacco use: Secondary | ICD-10-CM | POA: Insufficient documentation

## 2014-11-20 DIAGNOSIS — Z8709 Personal history of other diseases of the respiratory system: Secondary | ICD-10-CM | POA: Insufficient documentation

## 2014-11-20 DIAGNOSIS — K5900 Constipation, unspecified: Secondary | ICD-10-CM | POA: Diagnosis present

## 2014-11-20 DIAGNOSIS — K5909 Other constipation: Secondary | ICD-10-CM

## 2014-11-20 DIAGNOSIS — Z880 Allergy status to penicillin: Secondary | ICD-10-CM | POA: Insufficient documentation

## 2014-11-20 DIAGNOSIS — F410 Panic disorder [episodic paroxysmal anxiety] without agoraphobia: Secondary | ICD-10-CM | POA: Insufficient documentation

## 2014-11-20 DIAGNOSIS — Z87448 Personal history of other diseases of urinary system: Secondary | ICD-10-CM | POA: Insufficient documentation

## 2014-11-20 DIAGNOSIS — M797 Fibromyalgia: Secondary | ICD-10-CM | POA: Diagnosis not present

## 2014-11-20 DIAGNOSIS — Z872 Personal history of diseases of the skin and subcutaneous tissue: Secondary | ICD-10-CM | POA: Insufficient documentation

## 2014-11-20 LAB — URINALYSIS, ROUTINE W REFLEX MICROSCOPIC
BILIRUBIN URINE: NEGATIVE
Glucose, UA: NEGATIVE mg/dL
HGB URINE DIPSTICK: NEGATIVE
Ketones, ur: NEGATIVE mg/dL
Leukocytes, UA: NEGATIVE
Nitrite: NEGATIVE
PROTEIN: NEGATIVE mg/dL
SPECIFIC GRAVITY, URINE: 1.024 (ref 1.005–1.030)
Urobilinogen, UA: 0.2 mg/dL (ref 0.0–1.0)
pH: 6 (ref 5.0–8.0)

## 2014-11-20 MED ORDER — LIDOCAINE 5 % EX OINT: 1.0000 "application " | TOPICAL_OINTMENT | CUTANEOUS | Status: DC | PRN

## 2014-11-20 MED ORDER — LIDOCAINE 4 % EX CREA
TOPICAL_CREAM | Freq: Once | CUTANEOUS | Status: AC
  Administered 2014-11-20: 1 via TOPICAL
  Filled 2014-11-20: qty 5

## 2014-11-20 MED ORDER — PROMETHAZINE HCL 25 MG PO TABS
25.0000 mg | ORAL_TABLET | ORAL | Status: AC
  Administered 2014-11-20: 25 mg via ORAL
  Filled 2014-11-20 (×2): qty 1

## 2014-11-20 MED ORDER — DOCUSATE SODIUM 100 MG PO CAPS: 100.0000 mg | ORAL_CAPSULE | Freq: Two times a day (BID) | ORAL | Status: DC

## 2014-11-20 MED ORDER — LORAZEPAM 1 MG PO TABS
1.0000 mg | ORAL_TABLET | Freq: Once | ORAL | Status: AC
Start: 2014-11-20 — End: 2014-11-20
  Administered 2014-11-20: 1 mg via ORAL
  Filled 2014-11-20: qty 1

## 2014-11-20 MED ORDER — PEG 3350-KCL-NABCB-NACL-NASULF 236 G PO SOLR: 4000.0000 mL | Freq: Once | ORAL | Status: DC

## 2014-11-20 MED ORDER — SORBITOL 70 % SOLN
960.0000 mL | TOPICAL_OIL | Freq: Once | ORAL | Status: AC
  Administered 2014-11-20: 960 mL via RECTAL
  Filled 2014-11-20: qty 240

## 2014-11-20 NOTE — Telephone Encounter (Signed)
PLEASE NOTE: All timestamps contained within this report are represented as [place] Standard Time. CONFIDENTIALTY NOTICE: This fax transmission is intended only for the addressee. It contains information that is legally privileged, confidential or otherwise protected from use or disclosure. If you are not the intended recipient, you are strictly prohibited from reviewing, disclosing, copying using or disseminating any of this information or taking any action in reliance on or regarding this information. If you have received this fax in error, please notify us immediately by telephone so that we can arrange for its return to [place]. Phone: [phone], Toll-Free: [phone], Fax: [phone] Page: 1 of 1 Call Id: [id number] [facility] - Client [place] Patient Name: Gina [name] DOB: Mar 03, 1976 Initial Comment Caller states she has IBS, Constipated. Having severe abdomnal pain, Pain between vaginal and anus- felt a rip. Nurse Assessment Nurse: Donalynn Furlong, RN, Myna Hidalgo Date/Time Eilene Ghazi Time): 11/20/2014 12:23:21 PM Confirm and document reason for call. If symptomatic, describe symptoms. ---Caller states she has IBS," Constipated. Having severe abdomnal pain, Pain between vaginal and anus- felt a rip." Has the patient traveled out of the country within the last 30 days? ---No Does the patient require triage? ---Yes Related visit to physician within the last 2 weeks? ---Yes Does the PT have any chronic conditions? (i.e. diabetes, asthma, etc.) ---Yes List chronic conditions. ---IBS, chronic constipation, "I have a hole in my stomach", "they say I am crazy" Did the patient indicate they were pregnant? ---No Guidelines Guideline Title Affirmed Question Affirmed Notes Abdominal Pain - Upper [1] SEVERE pain (e.g., excruciating) AND [2] present > 1 hour Final Disposition User Go to ED Now Donalynn Furlong, RN, Myna Hidalgo Comments pt  very antagonistic during call, extremely agitated. Kept repeating all aspects of conversation, without listening to instructions, unless being re-directed to conversation repeatedly. Wants "my doctor Wayne Sever to call over to [facility] to direct my care, or else they won't know what the hell they are doing in the Emergency room". Advised that I will FAX this call and send copy to both [facility] ER

## 2014-11-20 NOTE — ED Notes (Signed)
Pt reports hx of multiple abd surgeries and chronic pain. Has hx of ibs and has to occ use enemas. Pt used an enema on Thursday, felt "ripping" pain to lower abd and was taken by ems to [facility] but pt left ama. Pt reports still only having minimal bowel movement and now has swelling to rectal area and severe pain. Pt also very anxious and requests sedation.

## 2014-11-20 NOTE — Discharge Instructions (Signed)
Constipation °Constipation is when a person has fewer than three bowel movements a week, has difficulty having a bowel movement, or has stools that are dry, hard, or larger than normal. As people grow older, constipation is more common. If you try to fix constipation with medicines that make you have a bowel movement (laxatives), the problem may get worse. Long-term laxative use may cause the muscles of the colon to become weak. A low-fiber diet, not taking in enough fluids, and taking certain medicines may make constipation worse.  °CAUSES  °· Certain medicines, such as antidepressants, pain medicine, iron supplements, antacids, and water pills.   °· Certain diseases, such as diabetes, irritable bowel syndrome (IBS), thyroid disease, or depression.   °· Not drinking enough water.   °· Not eating enough fiber-rich foods.   °· Stress or travel.   °· Lack of physical activity or exercise.   °· Ignoring the urge to have a bowel movement.   °· Using laxatives too much.   °SIGNS AND SYMPTOMS  °· Having fewer than three bowel movements a week.   °· Straining to have a bowel movement.   °· Having stools that are hard, dry, or larger than normal.   °· Feeling full or bloated.   °· Pain in the lower abdomen.   °· Not feeling relief after having a bowel movement.   °DIAGNOSIS  °Your health care provider will take a medical history and perform a physical exam. Further testing may be done for severe constipation. Some tests may include: °· A barium enema X-ray to examine your rectum, colon, and, sometimes, your small intestine.   °· A sigmoidoscopy to examine your lower colon.   °· A colonoscopy to examine your entire colon. °TREATMENT  °Treatment will depend on the severity of your constipation and what is causing it. Some dietary treatments include drinking more fluids and eating more fiber-rich foods. Lifestyle treatments may include regular exercise. If these diet and lifestyle recommendations do not help, your health care  provider may recommend taking over-the-counter laxative medicines to help you have bowel movements. Prescription medicines may be prescribed if over-the-counter medicines do not work.  °HOME CARE INSTRUCTIONS  °· Eat foods that have a lot of fiber, such as fruits, vegetables, whole grains, and beans. °· Limit foods high in fat and processed sugars, such as french fries, hamburgers, cookies, candies, and soda.   °· A fiber supplement may be added to your diet if you cannot get enough fiber from foods.   °· Drink enough fluids to keep your urine clear or pale yellow.   °· Exercise regularly or as directed by your health care provider.   °· Go to the restroom when you have the urge to go. Do not hold it.   °· Only take over-the-counter or prescription medicines as directed by your health care provider. Do not take other medicines for constipation without talking to your health care provider first.   °SEEK IMMEDIATE MEDICAL CARE IF:  °· You have bright red blood in your stool.   °· Your constipation lasts for more than 4 days or gets worse.   °· You have abdominal or rectal pain.   °· You have thin, pencil-like stools.   °· You have unexplained weight loss. °MAKE SURE YOU:  °· Understand these instructions. °· Will watch your condition. °· Will get help right away if you are not doing well or get worse. °Document Released: 07/16/2004 Document Revised: 10/23/2013 Document Reviewed: 07/30/2013 °ExitCare® Patient Information ©2015 [employer]. This information is not intended to replace advice given to you by your health care provider. Make sure you discuss any questions   you have with your health care provider.  Diet and Irritable Bowel Syndrome  No cure has been found for irritable bowel syndrome (IBS). Many options are available to treat the symptoms. Your caregiver will give you the best treatments available for your symptoms. He or she will also encourage you to manage stress and to make changes to your diet. You  need to work with your caregiver and Registered Dietician to find the best combination of medicine, diet, counseling, and support to control your symptoms. The following are some diet suggestions. FOODS THAT MAKE IBS WORSE  Fatty foods, such as [place] fries.  Milk products, such as cheese or ice cream.  Chocolate.  Alcohol.  Caffeine (found in coffee and some sodas).  Carbonated drinks, such as soda. If certain foods cause symptoms, you should eat less of them or stop eating them. FOOD JOURNAL   Keep a journal of the foods that seem to cause distress. Write down:  What you are eating during the day and when.  What problems you are having after eating.  When the symptoms occur in relation to your meals.  What foods always make you feel badly.  Take your notes with you to your caregiver to see if you should stop eating certain foods. FOODS THAT MAKE IBS BETTER Fiber reduces IBS symptoms, especially constipation, because it makes stools soft, bulky, and easier to pass. Fiber is found in bran, bread, cereal, beans, fruit, and vegetables. Examples of foods with fiber include:  Apples.  Peaches.  Pears.  Berries.  Figs.  Broccoli, raw.  Cabbage.  Carrots.  Raw peas.  Kidney beans.  Lima beans.  Whole-grain bread.  Whole-grain cereal. Add foods with fiber to your diet a little at a time. This will let your body get used to them. Too much fiber at once might cause gas and swelling of your abdomen. This can trigger symptoms in a person with IBS. Caregivers usually recommend a diet with enough fiber to produce soft, painless bowel movements. High fiber diets may cause gas and bloating. However, these symptoms often go away within a few weeks, as your body adjusts. In many cases, dietary fiber may lessen IBS symptoms, particularly constipation. However, it may not help pain or diarrhea. High fiber diets keep the colon mildly enlarged (distended) with the added fiber.  This may help prevent spasms in the colon. Some forms of fiber also keep water in the stool, thereby preventing hard stools that are difficult to pass.  Besides telling you to eat more foods with fiber, your caregiver may also tell you to get more fiber by taking a fiber pill or drinking water mixed with a special high fiber powder. An example of this is a natural fiber laxative containing psyllium seed.  TIPS  Large meals can cause cramping and diarrhea in people with IBS. If this happens to you, try eating 4 or 5 small meals a day, or try eating less at each of your usual 3 meals. It may also help if your meals are low in fat and high in carbohydrates. Examples of carbohydrates are pasta, rice, whole-grain breads and cereals, fruits, and vegetables.  If dairy products cause your symptoms to flare up, you can try eating less of those foods. You might be able to handle yogurt better than other dairy products, because it contains bacteria that helps with digestion. Dairy products are an important source of calcium and other nutrients. If you need to avoid dairy products, be sure to  talk with a Registered Dietitian about getting these nutrients through other food sources.  Drink enough water and fluids to keep your urine clear or pale yellow. This is important, especially if you have diarrhea. FOR MORE INFORMATION  International Foundation for Functional Gastrointestinal Disorders: www.iffgd.org  National Digestive Diseases Information Clearinghouse: digestive.AmenCredit.is Document Released: 01/08/2004 Document Revised: 01/10/2012 Document Reviewed: 01/18/2014 [facility] Patient Information 2015 [place], [place]. This information is not intended to replace advice given to you by your health care provider. Make sure you discuss any questions you have with your health care provider. High-Fiber Diet Fiber is found in fruits, vegetables, and grains. A high-fiber diet encourages the addition of more whole  grains, legumes, fruits, and vegetables in your diet. The recommended amount of fiber for adult males is 38 g per day. For adult females, it is 25 g per day. Pregnant and lactating women should get 28 g of fiber per day. If you have a digestive or bowel problem, ask your caregiver for advice before adding high-fiber foods to your diet. Eat a variety of high-fiber foods instead of only a select few type of foods.  PURPOSE  To increase stool bulk.  To make bowel movements more regular to prevent constipation.  To lower cholesterol.  To prevent overeating. WHEN IS THIS DIET USED?  It may be used if you have constipation and hemorrhoids.  It may be used if you have uncomplicated diverticulosis (intestine condition) and irritable bowel syndrome.  It may be used if you need help with weight management.  It may be used if you want to add it to your diet as a protective measure against atherosclerosis, diabetes, and cancer. SOURCES OF FIBER  Whole-grain breads and cereals.  Fruits, such as apples, oranges, bananas, berries, prunes, and pears.  Vegetables, such as green peas, carrots, sweet potatoes, beets, broccoli, cabbage, spinach, and artichokes.  Legumes, such split peas, soy, lentils.  Almonds. FIBER CONTENT IN FOODS Starches and Grains / Dietary Fiber (g)  Cheerios, 1 cup / 3 g  Corn Flakes cereal, 1 cup / 0.7 g  Rice crispy treat cereal, 1 cup / 0.3 g  Instant oatmeal (cooked),  cup / 2 g  Frosted wheat cereal, 1 cup / 5.1 g  Brown, long-grain rice (cooked), 1 cup / 3.5 g  White, long-grain rice (cooked), 1 cup / 0.6 g  Enriched macaroni (cooked), 1 cup / 2.5 g Legumes / Dietary Fiber (g)  Baked beans (canned, plain, or vegetarian),  cup / 5.2 g  Kidney beans (canned),  cup / 6.8 g  Pinto beans (cooked),  cup / 5.5 g Breads and Crackers / Dietary Fiber (g)  Plain or honey graham crackers, 2 squares / 0.7 g  Saltine crackers, 3 squares / 0.3 g  Plain,  salted pretzels, 10 pieces / 1.8 g  Whole-wheat bread, 1 slice / 1.9 g  White bread, 1 slice / 0.7 g  Raisin bread, 1 slice / 1.2 g  Plain bagel, 3 oz / 2 g  Flour tortilla, 1 oz / 0.9 g  Corn tortilla, 1 small / 1.5 g  Hamburger or hotdog bun, 1 small / 0.9 g Fruits / Dietary Fiber (g)  Apple with skin, 1 medium / 4.4 g  Sweetened applesauce,  cup / 1.5 g  Banana,  medium / 1.5 g  Grapes, 10 grapes / 0.4 g  Orange, 1 small / 2.3 g  Raisin, 1.5 oz / 1.6 g  Melon, 1 cup / 1.4 g Vegetables /  Dietary Fiber (g)  Green beans (canned),  cup / 1.3 g  Carrots (cooked),  cup / 2.3 g  Broccoli (cooked),  cup / 2.8 g  Peas (cooked),  cup / 4.4 g  Mashed potatoes,  cup / 1.6 g  Lettuce, 1 cup / 0.5 g  Corn (canned),  cup / 1.6 g  Tomato,  cup / 1.1 g Document Released: 10/18/2005 Document Revised: 04/18/2012 Document Reviewed: 01/20/2012 Exit[employer] Patient Information 2015 [place], [place]. This information is not intended to replace advice given to you by your health care provider. Make sure you discuss any questions you have with your health care provider.

## 2014-11-20 NOTE — ED Provider Notes (Signed)
Medical screening examination/treatment/procedure(s) were conducted as a shared visit with non-physician practitioner(s) and myself.  I personally evaluated the patient during the encounter.   EKG Interpretation None      Pt is a 39 y.o. F with history of chronic pain, fibromyalgia who is on chronic narcotics with prior history of multiple abdominal surgeries who presents the emergency department with chronic constipation she has been dealing with for years. Reports that she has had pain with bowel movements. No bloody stool or melena. No fevers or chills. No nausea, vomiting or diarrhea. She is passing gas. She was recently seen in the emergency department for the same and was instructed to start taking MiraLAX. Reports that she has been taking MiraLAX over-the-counter and stool softeners. Patient appears very anxious and is repeatedly asking me to "knock her out" or "sedate me" prior to giving her an enema. Have explained to patient that this is not appropriate. Have also explained to patient that I do not feel narcotics are appropriate as I feel they're likely contributing to her symptoms. On exam she has a mildly tender abdomen diffusely without peritoneal signs, no abdominal distention or tympany, she does have a small nonthrombosed external hemorrhoid and multiple small excoriated lesions around her rectum and lower labia majora bilaterally. No vesicular lesions. No prior history of STDs. She is sexually active with her husband.  Patient recently had an abdominal x-ray which demonstrated large stool burden. She is status post hysterectomy. I do not feel she needs further abdominal imaging today. Doubt bowel obstruction. Have offered small enema which she agrees to. Will also give her lidocaine ointment for her multiple excoriated lesions. We'll give Ativan for her anxiety.  She has had a bowel movement after SMOG enema.  We'll discharge with prescription for GoLYTELY to use as needed if over-the-counter  regimens do not work. Discussed return precautions.  [place], DO 11/20/14 [phone]

## 2014-11-20 NOTE — ED Provider Notes (Signed)
CSN: [id number]     Arrival date & time 11/20/14  1602 History   First MD Initiated Contact with Patient 11/20/14 1611     Chief Complaint  Patient presents with  . Constipation   Gina [name] is a 39 y.o. female with a history of IBS, chronic constipation, chronic pain, and multiple abdominal surgeries who presents to the emergency department complaining of general abdominal pain and constipation for years that has been worse in the last week. Patient has had a previous hysterectomy and multiple hernia repairs as well as chronic pain. Patient takes opiates chronically which seems to contribute to her constipation. Patient is complaining of low abdominal pain that she rates at an 8 out of 10. The patient reports she is passing gas. Patient reports normal appetite. Patient is also complaining of a raw area and her perineum for the past week or more. Patient reports she had a small bowel movement yesterday but otherwise has not had a good bowel movement since 5 days ago. Patient was seen in emergency room on 11/15/2014 for similar complaint. She denies history of STDs. The patient reports trying fiber smoothies, coffee The patient denies fevers, chills, nausea, vomiting, dysuria, hematuria, urinary urgency, urinary urgency, or changes to her appetite.   (Consider location/radiation/quality/duration/timing/severity/associated sxs/prior Treatment) HPI  Past Medical History  Diagnosis Date  . ABDOMINAL WALL HERNIA 02/27/2010  . ALLERGIC RHINITIS 06/02/2007  . ANOREXIA, CHRONIC 08/07/2008  . ANXIETY 09/15/2010  . BACK PAIN, THORACIC REGION 07/07/2007  . CERVICAL RADICULOPATHY 12/11/2008  . Condyloma acuminatum 04/23/2009  . CONSTIPATION 09/15/2010  . DYSPHAGIA UNSPECIFIED 09/09/2009  . 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 ENDOMETRIOSIS 12/16/2009  . FATIGUE 06/11/2010  . GERD 10/23/2009  . HERPES SIMPLEX INFECTION 06/11/2010  . LENTIGO 04/23/2009  . LUNG NODULE 06/17/2010  . Palpitations 10/23/2009  .  Stricture and stenosis of esophagus 05/13/2010  . TOBACCO ABUSE 08/07/2008  . Heart palpitations   . Urinary tract infection   . Rheumatoid arthritis(714.0)   . Ovarian cyst   . Abnormal Pap smear   . Fibromyalgia   . Narcotic abuse, continuous   . Panic disorder    Past Surgical History  Procedure Laterality Date  . Partial hysterectomy    . Endometrial ablation    . Hernia repair      X3  . Dilation and curettage of uterus      x1   . Cesarean section      x2   . Esophageal dilatation x 4    . Gum surgery    . Abdominal surgery    . Abdominal wall mesh  removal     Family History  Problem Relation Age of Onset  . Depression Mother   . Arthritis Mother   . Hyperlipidemia Father   . Hypertension Father    History  Substance Use Topics  . Smoking status: Current Every Day Smoker -- 0.50 packs/day    Types: Cigarettes  . Smokeless tobacco: Never Used  . Alcohol Use: No   OB History    Gravida Para Term Preterm AB TAB SAB Ectopic Multiple Living   3 2 2  0 1 0 1 0 0 2     Review of Systems  Constitutional: Negative for fever and chills.  HENT: Negative for congestion and sore throat.   Eyes: Negative for visual disturbance.  Respiratory: Negative for cough, shortness of breath and wheezing.   Cardiovascular: Negative for chest pain and palpitations.  Gastrointestinal: Positive for abdominal pain and  constipation. Negative for nausea, vomiting and diarrhea.  Genitourinary: Negative for dysuria.  Musculoskeletal: Negative for back pain and neck pain.  Skin: Negative for rash.  Neurological: Negative for weakness and headaches.      Allergies  Lamictal; Nicotine; Zofran; Chantix; Codeine; Ibuprofen; Metaxalone; Morphine and related; Ondansetron; Skelaxin; Sulfa antibiotics; Toradol; Varenicline; Amoxicillin; and Penicillins  Home Medications   Prior to Admission medications   Medication Sig Start Date End Date Taking? Authorizing Provider  acetaminophen  (TYLENOL) 325 MG tablet Take 325 mg by mouth every 4 (four) hours as needed for pain.     Historical Provider, MD  ALPRAZolam Duanne Moron) 1 MG tablet Take 1 mg by mouth 4 (four) times daily as needed for anxiety.    Historical Provider, MD  azithromycin (ZITHROMAX) 250 MG tablet Day 1 take 2 pills, days 2-5 take 1 pill per day. Patient not taking: Reported on 11/15/2014 08/30/14   Olga Millers, MD  benzonatate (TESSALON) 100 MG capsule Take 1 capsule (100 mg total) by mouth 3 (three) times daily as needed for cough. Patient not taking: Reported on 11/15/2014 08/30/14   Olga Millers, MD  dicyclomine (BENTYL) 20 MG tablet Take 1 tablet (20 mg total) by mouth 2 (two) times daily as needed for spasms. 11/16/14   Julianne Rice, MD  docusate sodium (COLACE) 100 MG capsule Take 1 capsule (100 mg total) by mouth every 12 (twelve) hours. 11/20/14   Verda Cumins Jack Bolio, PA-C  escitalopram (LEXAPRO) 20 MG tablet Take 20 mg by mouth daily.     Historical Provider, MD  lidocaine (XYLOCAINE) 5 % ointment Apply 1 application topically as needed. 11/20/14   Verda Cumins Jayma Volpi, PA-C  methocarbamol (ROBAXIN) 500 MG tablet Take 500 mg by mouth 3 (three) times daily as needed for muscle spasms.  05/01/13   Fransico Meadow, PA-C  Norethin Ace-Eth Estrad-FE (MINASTRIN 24 FE PO) Take by mouth. Take one by mouth daily.    Historical Provider, MD  oxyCODONE-acetaminophen (PERCOCET) 10-325 MG per tablet Take 1 tablet by mouth every 6 (six) hours as needed for pain.    Historical Provider, MD  polyethylene glycol (GOLYTELY) 236 G solution Take 4,000 mLs by mouth once. 11/20/14   Verda Cumins Tywon Niday, PA-C  polyethylene glycol (MIRALAX / GLYCOLAX) packet Take 17 g by mouth daily. 11/16/14   Julianne Rice, MD  zolpidem (AMBIEN) 5 MG tablet Take 5 mg by mouth at bedtime as needed for sleep.    Historical Provider, MD   BP 107/83 mmHg  Pulse 61  Temp(Src) 98.2 F (36.8 C) (Oral)  Resp 18  SpO2 93% Physical Exam   Constitutional: She appears well-developed and well-nourished. No distress.  Nontoxic appearing.  HENT:  Head: Normocephalic and atraumatic.  Mouth/Throat: Oropharynx is clear and moist. No oropharyngeal exudate.  Eyes: Conjunctivae are normal. Pupils are equal, round, and reactive to light. Right eye exhibits no discharge. Left eye exhibits no discharge.  Neck: Neck supple.  Cardiovascular: Normal rate, regular rhythm, normal heart sounds and intact distal pulses.  Exam reveals no gallop and no friction rub.   No murmur heard. HR 76  Pulmonary/Chest: Effort normal and breath sounds normal. No respiratory distress. She has no wheezes. She has no rales.  Abdominal: Soft. Bowel sounds are normal. She exhibits no distension and no mass. There is tenderness. There is no rebound and no guarding.  Abdomen is soft. Bowel sounds are present. Patient has generalized abdominal pain worse in her bilateral lower abdomen. No CVA  tenderness. Negative psoas and obturator sign.  Genitourinary:  GU exam done with female RN as chaperone. There is an excoriated lesion to the left of her lower labia majora. There is a small nonthrombosed external hemorrhoid. Digital rectal exam revealed no impaction and no fecal material in her rectal vault. Anal sphincter tone is normal. No vesicular lesions noted.   Musculoskeletal: She exhibits no edema.  Lymphadenopathy:    She has no cervical adenopathy.  Neurological: She is alert. Coordination normal.  Skin: Skin is warm and dry. No rash noted. She is not diaphoretic. No erythema. No pallor.  Psychiatric: Her behavior is normal. Her mood appears anxious.  Appears anxious.   Nursing note and vitals reviewed.   ED Course  Procedures (including critical care time) Labs Review Labs Reviewed  URINALYSIS, ROUTINE W REFLEX MICROSCOPIC    Imaging Review No results found.   EKG Interpretation None      Filed Vitals:   11/20/14 1607 11/20/14 1745 11/20/14 1845   BP: 106/84 91/72 107/83  Pulse: 102 62 61  Temp: 98.2 F (36.8 C)    TempSrc: Oral    Resp: 22 18   SpO2: 98% 96% 93%     MDM   Meds given in ED:  Medications  LORazepam (ATIVAN) tablet 1 mg (1 mg Oral Given 11/20/14 1716)  sorbitol, milk of mag, mineral oil, glycerin (SMOG) enema (960 mLs Rectal Given by Other 11/20/14 1757)  lidocaine (LMX) 4 % cream (1 application Topical Given by Other 11/20/14 1756)  promethazine (PHENERGAN) tablet 25 mg (25 mg Oral Given 11/20/14 1827)    Discharge Medication List as of 11/20/2014  6:51 PM    START taking these medications   Details  docusate sodium (COLACE) 100 MG capsule Take 1 capsule (100 mg total) by mouth every 12 (twelve) hours., Starting 11/20/2014, Until Discontinued, Print    lidocaine (XYLOCAINE) 5 % ointment Apply 1 application topically as needed., Starting 11/20/2014, Until Discontinued, Print    polyethylene glycol (GOLYTELY) 236 G solution Take 4,000 mLs by mouth once., Starting 11/20/2014, Print        Final diagnoses:  Chronic constipation   This is a 39 year old female with a history of chronic constipation, irritable bowel syndrome, multiple abdominal surgeries and chronic pain who presents the emergency department complaining of chronic constipation is worsened in the past week. Patient reports she is passing gas. Patient reports having a small bowel movement yesterday. Patient is afebrile and nontoxic appearing. Patient's abdomen is soft without peritoneal signs. The patient was seen in the emergency department on 11/15/2014 for similar symptoms and had a plain film of her abdomen which indicated constipation. I see no need for further imaging today. I doubt any bowel obstruction. Patient agreed to enema in the ED which she tolerated well and had a bowel movement in the ED. Patient provided lidocaine ointment for her excoriations externally labia majora. I believe this is likely due to scratching. Education provided on  high-fiber diet and laxative use. Education provided on constipation and narcotic use.  Patient given a prescription for GoLYTELY and Colace. The patient advises that she has a follow-up appointment with her primary care provider for this Friday. I advised the patient to keep this appointment. Strict return precautions were provided. Advised patient return to the emergency department new or worsening symptoms or new concerns. The patient verbalized understanding and agreement with plan.  This patient was discussed with and evaluated by Dr. Leonides Schanz who agrees with assessment and plan.  Hanley Hays, PA-C 11/20/14 2112

## 2014-11-20 NOTE — ED Notes (Signed)
NAD at this time. Pt is stable and going home with her husband.

## 2014-11-20 NOTE — Telephone Encounter (Signed)
Patient called and said that she is severely impacted and has not been able to go to the bathroom. She has used an enema and says she has ripped something inside of herself. She called her OBGYN and they told her to go to the hospital. She wanted to see Dr. Doug Sou today, but today is her half day. I explained that if she comes here to see someone else that they will more than likely send her to the emergency room. I spoke with Terri Piedra and he said that the patient needs to go to the emergency room. She says that the hospital has labeled her as a pill pusher and she is afraid that they won't help her. She said she is not asking for pain pills, she just wants relief because she is in pain. She states that she has not gone to the bathroom in six days. She was asking me if there was anything I could do to help the staff at the emergency room understand that she needs help and is not asking for pain medication. I explained to her that I have documented our conversation and it is in her chart. The hospital staff will be able to view her chart. I also explained to her to tell them exactly what she has told me and they will not turn her away.

## 2014-11-20 NOTE — ED Notes (Signed)
Pt tolerted .75 of her SMOG enema. Pt is on bedside commode.

## 2014-11-22 ENCOUNTER — Ambulatory Visit: Payer: BLUE CROSS/BLUE SHIELD | Admitting: Internal Medicine

## 2014-12-25 ENCOUNTER — Encounter (HOSPITAL_COMMUNITY): Payer: Self-pay | Admitting: *Deleted

## 2014-12-25 ENCOUNTER — Inpatient Hospital Stay (HOSPITAL_COMMUNITY)
Admission: AD | Admit: 2014-12-25 | Discharge: 2014-12-25 | Disposition: A | Discharge status: Home / Self Care | Payer: BLUE CROSS/BLUE SHIELD | Source: Ambulatory Visit | Attending: Obstetrics & Gynecology | Admitting: Obstetrics & Gynecology

## 2014-12-25 ENCOUNTER — Inpatient Hospital Stay (HOSPITAL_COMMUNITY): Payer: BLUE CROSS/BLUE SHIELD

## 2014-12-25 DIAGNOSIS — G8929 Other chronic pain: Secondary | ICD-10-CM | POA: Insufficient documentation

## 2014-12-25 DIAGNOSIS — R1033 Periumbilical pain: Secondary | ICD-10-CM | POA: Diagnosis present

## 2014-12-25 DIAGNOSIS — R1031 Right lower quadrant pain: Secondary | ICD-10-CM | POA: Diagnosis present

## 2014-12-25 DIAGNOSIS — N8320 Unspecified ovarian cysts: Secondary | ICD-10-CM | POA: Insufficient documentation

## 2014-12-25 DIAGNOSIS — R102 Pelvic and perineal pain: Secondary | ICD-10-CM

## 2014-12-25 LAB — CBC WITH DIFFERENTIAL/PLATELET
BASOS ABS: 0 10*3/uL (ref 0.0–0.1)
BASOS PCT: 0 % (ref 0–1)
Eosinophils Absolute: 0.2 10*3/uL (ref 0.0–0.7)
Eosinophils Relative: 3 % (ref 0–5)
HCT: 41.1 % (ref 36.0–46.0)
Hemoglobin: 14.2 g/dL (ref 12.0–15.0)
Lymphocytes Relative: 50 % — ABNORMAL HIGH (ref 12–46)
Lymphs Abs: 3.3 10*3/uL (ref 0.7–4.0)
MCH: 31.8 pg (ref 26.0–34.0)
MCHC: 34.5 g/dL (ref 30.0–36.0)
MCV: 92.2 fL (ref 78.0–100.0)
MONO ABS: 0.5 10*3/uL (ref 0.1–1.0)
Monocytes Relative: 7 % (ref 3–12)
NEUTROS ABS: 2.7 10*3/uL (ref 1.7–7.7)
NEUTROS PCT: 40 % — AB (ref 43–77)
Platelets: 164 10*3/uL (ref 150–400)
RBC: 4.46 MIL/uL (ref 3.87–5.11)
RDW: 12.6 % (ref 11.5–15.5)
WBC: 6.7 10*3/uL (ref 4.0–10.5)

## 2014-12-25 MED ORDER — HYDROMORPHONE HCL 1 MG/ML IJ SOLN
1.0000 mg | Freq: Once | INTRAMUSCULAR | Status: DC
  Filled 2014-12-25: qty 1

## 2014-12-25 NOTE — Discharge Instructions (Signed)
Ovarian Cyst °An ovarian cyst is a sac filled with fluid or blood. This sac is attached to the ovary. Some cysts go away on their own. Other cysts need treatment.  °HOME CARE  °· Only take medicine as told by your doctor. °· Follow up with your doctor as told. °· Get regular pelvic exams and Pap tests. °GET HELP IF: °· Your periods are late, not regular, or painful. °· You stop having periods. °· Your belly (abdominal) or pelvic pain does not go away. °· Your belly becomes large or puffy (swollen). °· You have a hard time peeing (totally emptying your bladder). °· You have pressure on your bladder. °· You have pain during sex. °· You feel fullness, pressure, or discomfort in your belly. °· You lose weight for no reason. °· You feel sick most of the time. °· You have a hard time pooping (constipation). °· You do not feel like eating. °· You develop pimples (acne). °· You have an increase in hair on your body and face. °· You are gaining weight for no reason. °· You think you are pregnant. °GET HELP RIGHT AWAY IF:  °· Your belly pain gets worse. °· You feel sick to your stomach (nauseous), and you throw up (vomit). °· You have a fever that comes on fast. °· You have belly pain while pooping (bowel movement). °· Your periods are heavier than usual. °MAKE SURE YOU:  °· Understand these instructions. °· Will watch your condition. °· Will get help right away if you are not doing well or get worse. °Document Released: 04/05/2008 Document Revised: 08/08/2013 Document Reviewed: 06/25/2013 °[employer]® Patient Information ©2015 [employer]. This information is not intended to replace advice given to you by your health care provider. Make sure you discuss any questions you have with your health care provider. ° °

## 2014-12-25 NOTE — MAU Provider Note (Signed)
History     CSN: [id number]  Arrival date and time: 12/25/14 4098   First Provider Initiated Contact with Patient 12/25/14 [phone]      Chief Complaint  Patient presents with  . Abdominal Pain   HPI  Ms. Elaysha Bevard is a 39 y.o. [id number] with chronic pain who presents to MAU today with complaint of RLQ pain radiating to the umbilicus since Saturday. The patient was seen at [facility] yesterday and diagnosed with a 3 cm hemorrhagic ovarian cyst. She states that pain increased tonight. She has also had associate N/V. She denies fever, vaginal bleeding, discharge or UTI symptoms. She does endorse lack of appetite.   OB History    Gravida Para Term Preterm AB TAB SAB Ectopic Multiple Living   3 2 2  0 1 0 1 0 0 2      Past Medical History  Diagnosis Date  . ABDOMINAL WALL HERNIA 02/27/2010  . ALLERGIC RHINITIS 06/02/2007  . ANOREXIA, CHRONIC 08/07/2008  . ANXIETY 09/15/2010  . BACK PAIN, THORACIC REGION 07/07/2007  . CERVICAL RADICULOPATHY 12/11/2008  . Condyloma acuminatum 04/23/2009  . CONSTIPATION 09/15/2010  . DYSPHAGIA UNSPECIFIED 09/09/2009  . 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 ENDOMETRIOSIS 12/16/2009  . FATIGUE 06/11/2010  . GERD 10/23/2009  . HERPES SIMPLEX INFECTION 06/11/2010  . LENTIGO 04/23/2009  . LUNG NODULE 06/17/2010  . Palpitations 10/23/2009  . Stricture and stenosis of esophagus 05/13/2010  . TOBACCO ABUSE 08/07/2008  . Heart palpitations   . Urinary tract infection   . Rheumatoid arthritis(714.0)   . Ovarian cyst   . Abnormal Pap smear   . Fibromyalgia   . Narcotic abuse, continuous   . Panic disorder     Past Surgical History  Procedure Laterality Date  . Partial hysterectomy    . Endometrial ablation    . Hernia repair      X3  . Dilation and curettage of uterus      x1   . Cesarean section      x2   . Esophageal dilatation x 4    . Gum surgery    . Abdominal surgery    . Abdominal wall mesh  removal      Family History  Problem Relation  Age of Onset  . Depression Mother   . Arthritis Mother   . Hyperlipidemia Father   . Hypertension Father     History  Substance Use Topics  . Smoking status: Current Every Day Smoker -- 0.50 packs/day    Types: Cigarettes  . Smokeless tobacco: Never Used  . Alcohol Use: No    Allergies:  Allergies  Allergen Reactions  . Lamictal [Lamotrigine] Anaphylaxis    Other reaction(s): Urticaria / Hives (ALLERGY)  . Nicotine Swelling and Palpitations    Other reaction(s): Respiratory Distress (ALLERGY/intolerance), Swelling (ALLERGY/intolerance), Tachycardia / Palpitations  (intolerance) Patches, lozenges Patches caused palpations lozenges throat swelled  . Zofran Anaphylaxis  . Chantix [Varenicline Tartrate] Other (See Comments)    "went crazy"  . Codeine     Other reaction(s): Nausea & Vomiting (ALLERGY/intolerance)  . Ibuprofen Nausea And Vomiting  . Metaxalone     Other reaction(s): Other (See Comments) Pt does not remember reaction  . Morphine And Related Other (See Comments)    Makes pt "very mean"  . Ondansetron     Other reaction(s): Anaphylaxis (ALLERGY), Swelling (ALLERGY/intolerance)  . Skelaxin Other (See Comments)    rash  . Sulfa Antibiotics Diarrhea  . Toradol [Ketorolac Tromethamine] Nausea And  Vomiting    Other reaction(s): Nausea & Vomiting (ALLERGY/intolerance)  . Varenicline Other (See Comments)    Other reaction(s): Diaphoresis / Sweating (intolerance) "went crazy" Night terrors  . Amoxicillin Rash  . Penicillins Rash    Other reaction(s): Urticaria / Hives (ALLERGY)    Prescriptions prior to admission  Medication Sig Dispense Refill Last Dose  . acetaminophen (TYLENOL) 325 MG tablet Take 325 mg by mouth every 4 (four) hours as needed for pain.    Past Month at Unknown time  . ALPRAZolam (XANAX) 1 MG tablet Take 1 mg by mouth 4 (four) times daily as needed for anxiety.   11/15/2014 at Unknown time  . azithromycin (ZITHROMAX) 250 MG tablet Day 1 take 2  pills, days 2-5 take 1 pill per day. (Patient not taking: Reported on 11/15/2014) 6 tablet 0   . benzonatate (TESSALON) 100 MG capsule Take 1 capsule (100 mg total) by mouth 3 (three) times daily as needed for cough. (Patient not taking: Reported on 11/15/2014) 20 capsule 0   . dicyclomine (BENTYL) 20 MG tablet Take 1 tablet (20 mg total) by mouth 2 (two) times daily as needed for spasms. 20 tablet 0   . docusate sodium (COLACE) 100 MG capsule Take 1 capsule (100 mg total) by mouth every 12 (twelve) hours. 30 capsule 0   . escitalopram (LEXAPRO) 20 MG tablet Take 20 mg by mouth daily.    11/15/2014 at Unknown time  . lidocaine (XYLOCAINE) 5 % ointment Apply 1 application topically as needed. 35.44 g 0   . methocarbamol (ROBAXIN) 500 MG tablet Take 500 mg by mouth 3 (three) times daily as needed for muscle spasms.    unknown at unknown  . Norethin Ace-Eth Estrad-FE (MINASTRIN 24 FE PO) Take by mouth. Take one by mouth daily.   11/15/2014 at Unknown time  . oxyCODONE-acetaminophen (PERCOCET) 10-325 MG per tablet Take 1 tablet by mouth every 6 (six) hours as needed for pain.   unknown at unknown  . polyethylene glycol (GOLYTELY) 236 G solution Take 4,000 mLs by mouth once. 4000 mL 0   . polyethylene glycol (MIRALAX / GLYCOLAX) packet Take 17 g by mouth daily. 14 each 0   . zolpidem (AMBIEN) 5 MG tablet Take 5 mg by mouth at bedtime as needed for sleep.   11/14/2014 at Unknown time    Review of Systems  Constitutional: Negative for fever and malaise/fatigue.  Gastrointestinal: Positive for nausea, vomiting and abdominal pain.  Genitourinary: Positive for frequency. Negative for dysuria and urgency.       Neg - vaginal bleeding, discharge   Physical Exam   Blood pressure 100/63, pulse 78, temperature 98.7 F (37.1 C), temperature source Oral, resp. rate 24, SpO2 95 %.  Physical Exam  Constitutional: She is oriented to person, place, and time. She appears well-developed and well-nourished. No  distress.  HENT:  Head: Normocephalic.  Cardiovascular: Normal rate.   Respiratory: Effort normal.  GI: Soft. She exhibits no distension and no mass. There is tenderness (moderate diffuse tenderness to palaption of the abdomen more prominent periumbilical and suprapubic). There is no rebound and no guarding.  Neurological: She is alert and oriented to person, place, and time.  Skin: Skin is warm and dry. No erythema.  Psychiatric: She has a normal mood and affect.   Results for orders placed or performed during the hospital encounter of 12/25/14 (from the past 24 hour(s))  CBC with Differential/Platelet     Status: Abnormal   Collection Time:  12/25/14  4:00 AM  Result Value Ref Range   WBC 6.7 4.0 - 10.5 K/uL   RBC 4.46 3.87 - 5.11 MIL/uL   Hemoglobin 14.2 12.0 - 15.0 g/dL   HCT 41.1 36.0 - 46.0 %   MCV 92.2 78.0 - 100.0 fL   MCH 31.8 26.0 - 34.0 pg   MCHC 34.5 30.0 - 36.0 g/dL   RDW 12.6 11.5 - 15.5 %   Platelets 164 150 - 400 K/uL   Neutrophils Relative % 40 (L) 43 - 77 %   Neutro Abs 2.7 1.7 - 7.7 K/uL   Lymphocytes Relative 50 (H) 12 - 46 %   Lymphs Abs 3.3 0.7 - 4.0 K/uL   Monocytes Relative 7 3 - 12 %   Monocytes Absolute 0.5 0.1 - 1.0 K/uL   Eosinophils Relative 3 0 - 5 %   Eosinophils Absolute 0.2 0.0 - 0.7 K/uL   Basophils Relative 0 0 - 1 %   Basophils Absolute 0.0 0.0 - 0.1 K/uL   No results found.   MAU Course  Procedures None  MDM Discussed Dr. Lynnette Caffey. Recommends 1 mg Dilaudid IM, CBC and Pelvic US.  Patient returned to MAU from [place] and requests additional pain medication.  Discussed results and patient presentation with Dr. Lynnette Caffey. She recommends discharge at this time as patient's pain is unlikely to be this severe from the cyst noted on [place]. Cyst is stable. She can follow-up in the office with Dr. Nori Riis.  I spent a significant amount of time explaining to the patient and her significant other that it was unlikely that this much pain would be caused by the  ovarian cyst, since it has not ruptured and there is no evidence of torsion.  Discussed sign/symptoms of appendicitis and advised close follow-up with PCP or [facility] if symptoms worsen significantly.  Low suspicion of appendicitis since WBCs are normal and patient is afebrile. Exam also does not reveal rebound tenderness. Dr. Lynnette Caffey agrees.  Assessment and Plan  A: Chronic pelvic pain Ovarian cyst  P: Discharge home Patient advised to continue current pain medications Patient advised to follow-up with Dr. Nori Riis as needed  Patient may return to MAU as needed or if her condition were to change or worsen   Luvenia Redden, PA-C  12/25/2014, 4:58 AM

## 2014-12-25 NOTE — MAU Note (Signed)
Pt states she went to the doctor on Tuesday because you were having pain on lower right side on Saturday. Dr said that pt had a cyst that has grown and it would rupture or they would have to do surgery. Pt states I been dealing with pain for 4-5 days and when i got up to to the bathroom I felt a whole whole lot of pressure that I can't deal with anymore.I thought I was going to be able to deal with it tonight." I have had a lot of cyst and this does not feel like a cyst it feels different"

## 2015-01-09 ENCOUNTER — Emergency Department (HOSPITAL_COMMUNITY)
Admission: EM | Admit: 2015-01-09 | Discharge: 2015-01-09 | Disposition: A | Discharge status: Home / Self Care | Payer: BLUE CROSS/BLUE SHIELD | Attending: Emergency Medicine | Admitting: Emergency Medicine

## 2015-01-09 ENCOUNTER — Ambulatory Visit (INDEPENDENT_AMBULATORY_CARE_PROVIDER_SITE_OTHER): Payer: BLUE CROSS/BLUE SHIELD | Admitting: Family

## 2015-01-09 ENCOUNTER — Encounter: Payer: Self-pay | Admitting: Family

## 2015-01-09 ENCOUNTER — Encounter (HOSPITAL_COMMUNITY): Payer: Self-pay | Admitting: *Deleted

## 2015-01-09 VITALS — BP 98/60 | HR 83 | Temp 99.0°F | Resp 18

## 2015-01-09 DIAGNOSIS — Z8719 Personal history of other diseases of the digestive system: Secondary | ICD-10-CM | POA: Diagnosis not present

## 2015-01-09 DIAGNOSIS — M79605 Pain in left leg: Secondary | ICD-10-CM | POA: Insufficient documentation

## 2015-01-09 DIAGNOSIS — Z79899 Other long term (current) drug therapy: Secondary | ICD-10-CM | POA: Diagnosis not present

## 2015-01-09 DIAGNOSIS — R110 Nausea: Secondary | ICD-10-CM | POA: Diagnosis not present

## 2015-01-09 DIAGNOSIS — Z720 Tobacco use: Secondary | ICD-10-CM | POA: Insufficient documentation

## 2015-01-09 DIAGNOSIS — M5442 Lumbago with sciatica, left side: Secondary | ICD-10-CM | POA: Diagnosis not present

## 2015-01-09 DIAGNOSIS — Z872 Personal history of diseases of the skin and subcutaneous tissue: Secondary | ICD-10-CM | POA: Insufficient documentation

## 2015-01-09 DIAGNOSIS — M549 Dorsalgia, unspecified: Secondary | ICD-10-CM | POA: Diagnosis present

## 2015-01-09 DIAGNOSIS — Z880 Allergy status to penicillin: Secondary | ICD-10-CM | POA: Diagnosis not present

## 2015-01-09 DIAGNOSIS — M5430 Sciatica, unspecified side: Secondary | ICD-10-CM | POA: Insufficient documentation

## 2015-01-09 DIAGNOSIS — Z8742 Personal history of other diseases of the female genital tract: Secondary | ICD-10-CM | POA: Insufficient documentation

## 2015-01-09 DIAGNOSIS — Z87440 Personal history of urinary (tract) infections: Secondary | ICD-10-CM | POA: Insufficient documentation

## 2015-01-09 DIAGNOSIS — K5900 Constipation, unspecified: Secondary | ICD-10-CM | POA: Diagnosis not present

## 2015-01-09 DIAGNOSIS — M5432 Sciatica, left side: Secondary | ICD-10-CM

## 2015-01-09 DIAGNOSIS — F410 Panic disorder [episodic paroxysmal anxiety] without agoraphobia: Secondary | ICD-10-CM | POA: Diagnosis not present

## 2015-01-09 DIAGNOSIS — Z87448 Personal history of other diseases of urinary system: Secondary | ICD-10-CM | POA: Diagnosis not present

## 2015-01-09 MED ORDER — HYDROMORPHONE HCL 1 MG/ML IJ SOLN: 1.0000 mg | Freq: Once | INTRAMUSCULAR | Status: DC

## 2015-01-09 MED ORDER — PROMETHAZINE HCL 25 MG/ML IJ SOLN
12.5000 mg | Freq: Once | INTRAMUSCULAR | Status: AC
  Administered 2015-01-09: 12.5 mg via INTRAVENOUS
  Filled 2015-01-09: qty 1

## 2015-01-09 MED ORDER — HYDROMORPHONE HCL 1 MG/ML IJ SOLN
1.0000 mg | Freq: Once | INTRAMUSCULAR | Status: AC
  Administered 2015-01-09: 1 mg via INTRAVENOUS
  Filled 2015-01-09: qty 1

## 2015-01-09 MED ORDER — DIAZEPAM 5 MG/ML IJ SOLN
5.0000 mg | Freq: Once | INTRAMUSCULAR | Status: AC
  Administered 2015-01-09: 5 mg via INTRAVENOUS
  Filled 2015-01-09: qty 2

## 2015-01-09 MED ORDER — DIAZEPAM 5 MG PO TABS: 5.0000 mg | ORAL_TABLET | Freq: Once | ORAL | Status: DC

## 2015-01-09 NOTE — Progress Notes (Signed)
Pre visit review using our clinic review tool, if applicable. No additional management support is needed unless otherwise documented below in the visit note. 

## 2015-01-09 NOTE — Progress Notes (Signed)
Subjective:    Patient ID: Gina [name], female    DOB: Jun 04, 1976, 39 y.o.   MRN: [id number]  Chief Complaint  Patient presents with  . Sciatica    started yesterday and go alot worse today, pt is having severe pain in left lower back that goes down her left leg, feeling nauseous right now    HPI:  Gina [name] is a 39 y.o. female who presents today for an acute visit.  This is a chronic problem that is worsening. Associated symptoms of severe pain in her left lower back that radidates to her toes has been going on for 1 day.  Indicates the pain gradually started this morning and has progressively worsened over the course of the day. Intensity of the pain and symptoms is strong enough to prevent her from being able to walk. Describes sharp pain in her left sciatic region and numbness and tingling in her toes. She has not tried any treatments at home for this. Indicates she just wants to know what is wrong.   Allergies  Allergen Reactions  . Lamictal [Lamotrigine] Anaphylaxis    Other reaction(s): Urticaria / Hives (ALLERGY)  . Nicotine Swelling and Palpitations    Other reaction(s): Respiratory Distress (ALLERGY/intolerance), Swelling (ALLERGY/intolerance), Tachycardia / Palpitations  (intolerance) Patches, lozenges Patches caused palpations lozenges throat swelled  . Zofran Anaphylaxis  . Chantix [Varenicline Tartrate] Other (See Comments)    "went crazy"  . Codeine     Other reaction(s): Nausea & Vomiting (ALLERGY/intolerance)  . Ibuprofen Nausea And Vomiting  . Metaxalone     Other reaction(s): Other (See Comments) Pt does not remember reaction  . Morphine And Related Other (See Comments)    Makes pt "very mean"  . Ondansetron     Other reaction(s): Anaphylaxis (ALLERGY), Swelling (ALLERGY/intolerance)  . Skelaxin Other (See Comments)    rash  . Sulfa Antibiotics Diarrhea  . Toradol [Ketorolac Tromethamine] Nausea And Vomiting    Other reaction(s): Nausea & Vomiting  (ALLERGY/intolerance)  . Varenicline Other (See Comments)    Other reaction(s): Diaphoresis / Sweating (intolerance) "went crazy" Night terrors  . Amoxicillin Rash  . Penicillins Rash    Other reaction(s): Urticaria / Hives (ALLERGY)    Current Outpatient Prescriptions on File Prior to Visit  Medication Sig Dispense Refill  . acetaminophen (TYLENOL) 325 MG tablet Take 325 mg by mouth every 4 (four) hours as needed for pain.     Marland Kitchen ALPRAZolam (XANAX) 1 MG tablet Take 1 mg by mouth 4 (four) times daily as needed for anxiety.    Marland Kitchen escitalopram (LEXAPRO) 20 MG tablet Take 20 mg by mouth daily.     Marland Kitchen lidocaine (XYLOCAINE) 5 % ointment Apply 1 application topically as needed. 35.44 g 0  . methocarbamol (ROBAXIN) 500 MG tablet Take 500 mg by mouth 3 (three) times daily as needed for muscle spasms.     . Norethin Ace-Eth Estrad-FE (MINASTRIN 24 FE PO) Take by mouth. Take one by mouth daily.    Marland Kitchen oxyCODONE-acetaminophen (PERCOCET) 10-325 MG per tablet Take 1 tablet by mouth every 6 (six) hours as needed for pain.    . polyethylene glycol (MIRALAX / GLYCOLAX) packet Take 17 g by mouth daily. 14 each 0  . zolpidem (AMBIEN) 5 MG tablet Take 5 mg by mouth at bedtime as needed for sleep.     No current facility-administered medications on file prior to visit.    Past Medical History  Diagnosis Date  . ABDOMINAL WALL HERNIA 02/27/2010  .  ALLERGIC RHINITIS 06/02/2007  . ANOREXIA, CHRONIC 08/07/2008  . ANXIETY 09/15/2010  . BACK PAIN, THORACIC REGION 07/07/2007  . CERVICAL RADICULOPATHY 12/11/2008  . Condyloma acuminatum 04/23/2009  . CONSTIPATION 09/15/2010  . DYSPHAGIA UNSPECIFIED 09/09/2009  . 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 ENDOMETRIOSIS 12/16/2009  . FATIGUE 06/11/2010  . GERD 10/23/2009  . HERPES SIMPLEX INFECTION 06/11/2010  . LENTIGO 04/23/2009  . LUNG NODULE 06/17/2010  . Palpitations 10/23/2009  . Stricture and stenosis of esophagus 05/13/2010  . TOBACCO ABUSE 08/07/2008  . Heart palpitations     . Urinary tract infection   . Rheumatoid arthritis(714.0)   . Ovarian cyst   . Abnormal Pap smear   . Fibromyalgia   . Narcotic abuse, continuous   . Panic disorder     Review of Systems  Musculoskeletal:       Positive for gluteal pain.   Neurological: Positive for numbness.      Objective:    BP 98/60 mmHg  Pulse 83  Temp(Src) 99 F (37.2 C) (Oral)  Resp 18  SpO2 96% Nursing note and vital signs reviewed.  Physical Exam  Constitutional: She is oriented to person, place, and time. She appears well-developed and well-nourished. She appears distressed.  Cardiovascular: Normal rate, regular rhythm, normal heart sounds and intact distal pulses.   Pulmonary/Chest: Effort normal and breath sounds normal.  Musculoskeletal:  No obvious deformity, discoloration, or edema of lower back or left hip noted. Extreme tenderness noted over gluteus medius and sciatic nerve. Patient unable to move her leg at this time and describing numbness and tingling in her toes. Dorsiflexion of her foot increases the shooting pains from her hip down to her toes. Other range of motion tests unable to be performed secondary to patient pain.   Neurological: She is alert and oriented to person, place, and time.  Skin: Skin is warm and dry.  Psychiatric: She has a normal mood and affect. Her behavior is normal. Judgment and thought content normal.       Assessment & Plan:

## 2015-01-09 NOTE — ED Notes (Signed)
Pt was seen by her neurologist for L lower back and L leg pain.  MRI and nerve conduction pain was ordered.  Pt states she is unable to move leg d/t leg pain. Denies loss of bowel or bladder habits.

## 2015-01-09 NOTE — Patient Instructions (Signed)
Based on the amount of pain you are experiencing I am recommending you be seen in the Emergency Room for further evaluation and treatment.   Sciatica Sciatica is pain, weakness, numbness, or tingling along the path of the sciatic nerve. The nerve starts in the lower back and runs down the back of each leg. The nerve controls the muscles in the lower leg and in the back of the knee, while also providing sensation to the back of the thigh, lower leg, and the sole of your foot. Sciatica is a symptom of another medical condition. For instance, nerve damage or certain conditions, such as a herniated disk or bone spur on the spine, pinch or put pressure on the sciatic nerve. This causes the pain, weakness, or other sensations normally associated with sciatica. Generally, sciatica only affects one side of the body. CAUSES   Herniated or slipped disc.  Degenerative disk disease.  A pain disorder involving the narrow muscle in the buttocks (piriformis syndrome).  Pelvic injury or fracture.  Pregnancy.  Tumor (rare). SYMPTOMS  Symptoms can vary from mild to very severe. The symptoms usually travel from the low back to the buttocks and down the back of the leg. Symptoms can include:  Mild tingling or dull aches in the lower back, leg, or hip.  Numbness in the back of the calf or sole of the foot.  Burning sensations in the lower back, leg, or hip.  Sharp pains in the lower back, leg, or hip.  Leg weakness.  Severe back pain inhibiting movement. These symptoms may get worse with coughing, sneezing, laughing, or prolonged sitting or standing. Also, being overweight may worsen symptoms. DIAGNOSIS  Your caregiver will perform a physical exam to look for common symptoms of sciatica. He or she may ask you to do certain movements or activities that would trigger sciatic nerve pain. Other tests may be performed to find the cause of the sciatica. These may include:  Blood tests.  X-rays.  Imaging  tests, such as an MRI or CT scan. TREATMENT  Treatment is directed at the cause of the sciatic pain. Sometimes, treatment is not necessary and the pain and discomfort goes away on its own. If treatment is needed, your caregiver may suggest:  Over-the-counter medicines to relieve pain.  Prescription medicines, such as anti-inflammatory medicine, muscle relaxants, or narcotics.  Applying heat or ice to the painful area.  Steroid injections to lessen pain, irritation, and inflammation around the nerve.  Reducing activity during periods of pain.  Exercising and stretching to strengthen your abdomen and improve flexibility of your spine. Your caregiver may suggest losing weight if the extra weight makes the back pain worse.  Physical therapy.  Surgery to eliminate what is pressing or pinching the nerve, such as a bone spur or part of a herniated disk. HOME CARE INSTRUCTIONS   Only take over-the-counter or prescription medicines for pain or discomfort as directed by your caregiver.  Apply ice to the affected area for 20 minutes, 3-4 times a day for the first 48-72 hours. Then try heat in the same way.  Exercise, stretch, or perform your usual activities if these do not aggravate your pain.  Attend physical therapy sessions as directed by your caregiver.  Keep all follow-up appointments as directed by your caregiver.  Do not wear high heels or shoes that do not provide proper support.  Check your mattress to see if it is too soft. A firm mattress may lessen your pain and discomfort. SEEK  IMMEDIATE MEDICAL CARE IF:   You lose control of your bowel or bladder (incontinence).  You have increasing weakness in the lower back, pelvis, buttocks, or legs.  You have redness or swelling of your back.  You have a burning sensation when you urinate.  You have pain that gets worse when you lie down or awakens you at night.  Your pain is worse than you have experienced in the past.  Your  pain is lasting longer than 4 weeks.  You are suddenly losing weight without reason. MAKE SURE YOU:  Understand these instructions.  Will watch your condition.  Will get help right away if you are not doing well or get worse. Document Released: 10/12/2001 Document Revised: 04/18/2012 Document Reviewed: 02/27/2012 [facility] Patient Information 2015 [place], [place]. This information is not intended to replace advice given to you by your health care provider. Make sure you discuss any questions you have with your health care provider.

## 2015-01-09 NOTE — Discharge Instructions (Signed)
Sciatica Sciatica is pain, weakness, numbness, or tingling along the path of the sciatic nerve. The nerve starts in the lower back and runs down the back of each leg. The nerve controls the muscles in the lower leg and in the back of the knee, while also providing sensation to the back of the thigh, lower leg, and the sole of your foot. Sciatica is a symptom of another medical condition. For instance, nerve damage or certain conditions, such as a herniated disk or bone spur on the spine, pinch or put pressure on the sciatic nerve. This causes the pain, weakness, or other sensations normally associated with sciatica. Generally, sciatica only affects one side of the body. CAUSES   Herniated or slipped disc.  Degenerative disk disease.  A pain disorder involving the narrow muscle in the buttocks (piriformis syndrome).  Pelvic injury or fracture.  Pregnancy.  Tumor (rare). SYMPTOMS  Symptoms can vary from mild to very severe. The symptoms usually travel from the low back to the buttocks and down the back of the leg. Symptoms can include:  Mild tingling or dull aches in the lower back, leg, or hip.  Numbness in the back of the calf or sole of the foot.  Burning sensations in the lower back, leg, or hip.  Sharp pains in the lower back, leg, or hip.  Leg weakness.  Severe back pain inhibiting movement. These symptoms may get worse with coughing, sneezing, laughing, or prolonged sitting or standing. Also, being overweight may worsen symptoms. DIAGNOSIS  Your caregiver will perform a physical exam to look for common symptoms of sciatica. He or she may ask you to do certain movements or activities that would trigger sciatic nerve pain. Other tests may be performed to find the cause of the sciatica. These may include:  Blood tests.  X-rays.  Imaging tests, such as an MRI or CT scan. TREATMENT  Treatment is directed at the cause of the sciatic pain. Sometimes, treatment is not necessary  and the pain and discomfort goes away on its own. If treatment is needed, your caregiver may suggest:  Over-the-counter medicines to relieve pain.  Prescription medicines, such as anti-inflammatory medicine, muscle relaxants, or narcotics.  Applying heat or ice to the painful area.  Steroid injections to lessen pain, irritation, and inflammation around the nerve.  Reducing activity during periods of pain.  Exercising and stretching to strengthen your abdomen and improve flexibility of your spine. Your caregiver may suggest losing weight if the extra weight makes the back pain worse.  Physical therapy.  Surgery to eliminate what is pressing or pinching the nerve, such as a bone spur or part of a herniated disk. HOME CARE INSTRUCTIONS   Only take over-the-counter or prescription medicines for pain or discomfort as directed by your caregiver.  Apply ice to the affected area for 20 minutes, 3-4 times a day for the first 48-72 hours. Then try heat in the same way.  Exercise, stretch, or perform your usual activities if these do not aggravate your pain.  Attend physical therapy sessions as directed by your caregiver.  Keep all follow-up appointments as directed by your caregiver.  Do not wear high heels or shoes that do not provide proper support.  Check your mattress to see if it is too soft. A firm mattress may lessen your pain and discomfort. SEEK IMMEDIATE MEDICAL CARE IF:   You lose control of your bowel or bladder (incontinence).  You have increasing weakness in the lower back, pelvis, buttocks,   or legs.  You have redness or swelling of your back.  You have a burning sensation when you urinate.  You have pain that gets worse when you lie down or awakens you at night.  Your pain is worse than you have experienced in the past.  Your pain is lasting longer than 4 weeks.  You are suddenly losing weight without reason. MAKE SURE YOU:  Understand these  instructions.  Will watch your condition.  Will get help right away if you are not doing well or get worse. Document Released: 10/12/2001 Document Revised: 04/18/2012 Document Reviewed: 02/27/2012 [employer] Patient Information 2015 [employer]. This information is not intended to replace advice given to you by your health care provider. Make sure you discuss any questions you have with your health care provider.  

## 2015-01-09 NOTE — ED Notes (Signed)
MD at bedside. 

## 2015-01-09 NOTE — ED Provider Notes (Signed)
CSN: [id number]     Arrival date & time 01/09/15  1654 History  This chart was scribed for non-physician practitioner, Brent General, PA-C, working with Quintella Reichert, MD by Evelene Croon, ED Scribe. This patient was seen in room TR11C/TR11C and the patient's care was started at 5:58 PM.    Chief Complaint  Patient presents with  . Leg Pain    sciatic nerve pain    The history is provided by the patient. No language interpreter was used.    HPI Comments:  Gina [name] is a 39 y.o. female with a h/o sciatica who presents to the Emergency Department complaining of moderate-severe LLE pain that started yesterday and has progressively worsened throughout the day.Her pain starts in her lower back/buttock area and radiates down her LLE. Her pain is worse when ambulating. She reports mild nausea due to pain. Her pain today is worse than past episodes of sciatica.She denies fever, vomiting, saddle anesthesia, loss of bowel/bladdder incontinece. She denies history of cancer or IV drug use. No alleviating factors noted. Pt saw her neurologist yesterday and was scheduled for an MRI for April 2016.   Past Medical History  Diagnosis Date  . ABDOMINAL WALL HERNIA 02/27/2010  . ALLERGIC RHINITIS 06/02/2007  . ANOREXIA, CHRONIC 08/07/2008  . ANXIETY 09/15/2010  . BACK PAIN, THORACIC REGION 07/07/2007  . CERVICAL RADICULOPATHY 12/11/2008  . Condyloma acuminatum 04/23/2009  . CONSTIPATION 09/15/2010  . DYSPHAGIA UNSPECIFIED 09/09/2009  . 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 ENDOMETRIOSIS 12/16/2009  . FATIGUE 06/11/2010  . GERD 10/23/2009  . HERPES SIMPLEX INFECTION 06/11/2010  . LENTIGO 04/23/2009  . LUNG NODULE 06/17/2010  . Palpitations 10/23/2009  . Stricture and stenosis of esophagus 05/13/2010  . TOBACCO ABUSE 08/07/2008  . Heart palpitations   . Urinary tract infection   . Rheumatoid arthritis(714.0)   . Ovarian cyst   . Abnormal Pap smear   . Fibromyalgia   . Narcotic abuse, continuous   . Panic  disorder    Past Surgical History  Procedure Laterality Date  . Partial hysterectomy    . Endometrial ablation    . Hernia repair      X3  . Dilation and curettage of uterus      x1   . Cesarean section      x2   . Esophageal dilatation x 4    . Gum surgery    . Abdominal surgery    . Abdominal wall mesh  removal     Family History  Problem Relation Age of Onset  . Depression Mother   . Arthritis Mother   . Hyperlipidemia Father   . Hypertension Father    History  Substance Use Topics  . Smoking status: Current Every Day Smoker -- 0.50 packs/day    Types: Cigarettes  . Smokeless tobacco: Never Used  . Alcohol Use: No   OB History    Gravida Para Term Preterm AB TAB SAB Ectopic Multiple Living   3 2 2  0 1 0 1 0 0 2     Review of Systems  Gastrointestinal: Positive for nausea.  Musculoskeletal: Positive for myalgias and back pain.      Allergies  Lamictal; Nicotine; Zofran; Chantix; Codeine; Ibuprofen; Metaxalone; Morphine and related; Ondansetron; Skelaxin; Sulfa antibiotics; Toradol; Amoxicillin; and Penicillins  Home Medications   Prior to Admission medications   Medication Sig Start Date End Date Taking? Authorizing Provider  acetaminophen (TYLENOL) 325 MG tablet Take 325 mg by mouth every 4 (four) hours as needed  for pain.    Yes Historical Provider, MD  ALPRAZolam Duanne Moron) 1 MG tablet Take 1 mg by mouth 4 (four) times daily as needed for anxiety.   Yes Historical Provider, MD  escitalopram (LEXAPRO) 20 MG tablet Take 20 mg by mouth daily.    Yes Historical Provider, MD  methocarbamol (ROBAXIN) 500 MG tablet Take 500 mg by mouth 3 (three) times daily as needed for muscle spasms.  05/01/13  Yes Hollace Kinnier Sofia, PA-C  Norethin Ace-Eth Estrad-FE (MINASTRIN 24 FE PO) Take by mouth. Take one by mouth daily.   Yes Historical Provider, MD  oxyCODONE-acetaminophen (PERCOCET) 10-325 MG per tablet Take 1 tablet by mouth every 6 (six) hours as needed for pain.   Yes  Historical Provider, MD  polyethylene glycol (MIRALAX / GLYCOLAX) packet Take 17 g by mouth daily. 11/16/14  Yes Julianne Rice, MD  zolpidem (AMBIEN) 5 MG tablet Take 5 mg by mouth at bedtime as needed for sleep.   Yes Historical Provider, MD  lidocaine (XYLOCAINE) 5 % ointment Apply 1 application topically as needed. Patient not taking: Reported on 01/09/2015 11/20/14   Waynetta Pean, PA-C   BP 98/60 mmHg  Pulse 68  Temp(Src) 98.5 F (36.9 C) (Oral)  Resp 16  SpO2 100% Physical Exam  Constitutional: She is oriented to person, place, and time. She appears well-developed and well-nourished. No distress.  HENT:  Head: Normocephalic and atraumatic.  Eyes: Right eye exhibits no discharge. Left eye exhibits no discharge. No scleral icterus.  Neck: Normal range of motion.  Cardiovascular: Normal rate.   Pulmonary/Chest: Effort normal. No respiratory distress.  Abdominal: She exhibits no distension.  Musculoskeletal: Normal range of motion.       Cervical back: Normal.       Thoracic back: Normal.       Lumbar back: She exhibits tenderness. She exhibits normal range of motion, no bony tenderness, no swelling, no edema and no deformity.       Back:  Neurological: She is alert and oriented to person, place, and time. She has normal strength. No cranial nerve deficit or sensory deficit. GCS eye subscore is 4. GCS verbal subscore is 5. GCS motor subscore is 6.  Reflex Scores:      Patellar reflexes are 2+ on the right side and 2+ on the left side.      Achilles reflexes are 2+ on the right side and 2+ on the left side. Patient fully alert, answering questions appropriately in full, clear sentences. Cranial nerves II through XII grossly intact. Motor strength 5 out of 5 in all major muscle use of upper and lower extremity. Distal sensation intact.  Skin: Skin is warm and dry. She is not diaphoretic.  Psychiatric: She has a normal mood and affect.  Nursing note and vitals reviewed.   ED  Course  Procedures   DIAGNOSTIC STUDIES:  Oxygen Saturation is 99% on RA, nomal by my interpretation.    COORDINATION OF CARE:  6:03 PM Pt requesting dilaudid IV with phenergan. Will order appropriate meds. Discussed treatment plan with pt at bedside and pt agreed to plan.  Labs Review Labs Reviewed - No data to display  Imaging Review No results found.   EKG Interpretation None      MDM   Final diagnoses:  Left-sided low back pain with left-sided sciatica    Patient with back pain consistent with SI joint pain and sciatic nerve pain. Initially had difficulty with patient cooperating for a full neurologic exam.  After pain management, no neurological deficits and normal neuro exam.  Patient can walk but states is painful.  No loss of bowel or bladder control.  No concern for cauda equina.  No fever, night sweats, weight loss, h/o cancer, IVDU.  RICE protocol and pain medicine indicated and discussed with patient.   BP 98/60 mmHg  Pulse 68  Temp(Src) 98.5 F (36.9 C) (Oral)  Resp 16  SpO2 100%  Signed,  Dahlia Bailiff, PA-C 2:58 AM  Patient seen and discussed with Dr. Quintella Reichert, M.D.  Dahlia Bailiff, PA-C 01/10/15 [place], MD 01/14/15 [phone]

## 2015-01-09 NOTE — Assessment & Plan Note (Signed)
Symptoms and exam consistent with sciatica. Given extreme pain and numbness and tingling of the foot with the inability to walk, recommend patient be seen in the emergency department for further management. Discussed further treatment options with patient. She indicates she is scheduled for an MRI in April. Patient indicates she can only have cortisone, which is unavailable in the office. Follow-up pending pain control and symptom relief.

## 2015-01-22 ENCOUNTER — Telehealth: Payer: Self-pay | Admitting: Family

## 2015-01-22 NOTE — Telephone Encounter (Signed)
Pt request steroid for her pain to be send to [facility] on [address] in [place] (near hospital). Pt stated that she talk to Marya Amsler about this last ov but she didn't want to take it. Pt is going to be in [place] for couple day that's why she want it to send there. Pt stated she had an an MRI at [facility] and they gave her some pain med, pt stated she doesn't want to take the pain med because its temporary helping her pain. Please help, she is in a lot of pain

## 2015-01-22 NOTE — Telephone Encounter (Signed)
Please call patient and reconfirm the [facility] that she would like as there are none listed on [address] in [place].

## 2015-01-23 MED ORDER — PREDNISONE 10 MG PO TABS
ORAL_TABLET | ORAL | Status: DC
Start: 2015-01-23 — End: 2015-02-03

## 2015-01-23 NOTE — Telephone Encounter (Signed)
Medication sent.

## 2015-01-23 NOTE — Telephone Encounter (Signed)
Patient would like [facility].  Ph number is [phone] address is [address] Patient has to chew pills.  Can not be time released.

## 2015-01-23 NOTE — Telephone Encounter (Signed)
LVM for pt to call back.

## 2015-02-02 ENCOUNTER — Encounter (HOSPITAL_COMMUNITY): Payer: Self-pay | Admitting: Emergency Medicine

## 2015-02-02 ENCOUNTER — Emergency Department (HOSPITAL_COMMUNITY)
Admission: EM | Admit: 2015-02-02 | Discharge: 2015-02-03 | Disposition: A | Discharge status: Home / Self Care | Payer: BLUE CROSS/BLUE SHIELD | Attending: Emergency Medicine | Admitting: Emergency Medicine

## 2015-02-02 DIAGNOSIS — Z8619 Personal history of other infectious and parasitic diseases: Secondary | ICD-10-CM | POA: Insufficient documentation

## 2015-02-02 DIAGNOSIS — Z79899 Other long term (current) drug therapy: Secondary | ICD-10-CM | POA: Insufficient documentation

## 2015-02-02 DIAGNOSIS — Z8719 Personal history of other diseases of the digestive system: Secondary | ICD-10-CM | POA: Insufficient documentation

## 2015-02-02 DIAGNOSIS — F419 Anxiety disorder, unspecified: Secondary | ICD-10-CM | POA: Diagnosis not present

## 2015-02-02 DIAGNOSIS — Z87440 Personal history of urinary (tract) infections: Secondary | ICD-10-CM | POA: Diagnosis not present

## 2015-02-02 DIAGNOSIS — M545 Low back pain: Secondary | ICD-10-CM | POA: Diagnosis present

## 2015-02-02 DIAGNOSIS — M5432 Sciatica, left side: Secondary | ICD-10-CM | POA: Diagnosis not present

## 2015-02-02 DIAGNOSIS — Z7952 Long term (current) use of systemic steroids: Secondary | ICD-10-CM | POA: Insufficient documentation

## 2015-02-02 DIAGNOSIS — Z720 Tobacco use: Secondary | ICD-10-CM | POA: Insufficient documentation

## 2015-02-02 DIAGNOSIS — Z8739 Personal history of other diseases of the musculoskeletal system and connective tissue: Secondary | ICD-10-CM | POA: Diagnosis not present

## 2015-02-02 DIAGNOSIS — Z880 Allergy status to penicillin: Secondary | ICD-10-CM | POA: Diagnosis not present

## 2015-02-02 MED ORDER — HYDROMORPHONE HCL 1 MG/ML IJ SOLN
1.0000 mg | Freq: Once | INTRAMUSCULAR | Status: AC
  Administered 2015-02-03: 1 mg via INTRAVENOUS
  Filled 2015-02-02: qty 1

## 2015-02-02 MED ORDER — PROMETHAZINE HCL 25 MG/ML IJ SOLN
12.5000 mg | Freq: Once | INTRAMUSCULAR | Status: AC
  Administered 2015-02-03: 12.5 mg via INTRAVENOUS
  Filled 2015-02-02 (×2): qty 1

## 2015-02-02 MED ORDER — DEXAMETHASONE SODIUM PHOSPHATE 10 MG/ML IJ SOLN
10.0000 mg | Freq: Once | INTRAMUSCULAR | Status: AC
  Administered 2015-02-03: 10 mg via INTRAVENOUS
  Filled 2015-02-02: qty 1

## 2015-02-02 MED ORDER — LORAZEPAM 2 MG/ML IJ SOLN
1.0000 mg | Freq: Once | INTRAMUSCULAR | Status: AC
  Administered 2015-02-03: 1 mg via INTRAVENOUS
  Filled 2015-02-02: qty 1

## 2015-02-02 NOTE — ED Notes (Signed)
Boyfriend stating "I think my girlfriend is passed out" -- pt quiet-- resp easy, assisted from prone to supine position with 2 staff, pt began screaming again. Pt states that "my cyst is now rupturing" when asked about cyst-- states "I have a very large ovarian cyst and it is rupturing right now"

## 2015-02-02 NOTE — ED Provider Notes (Signed)
CSN: [id number]     Arrival date & time 02/02/15  2103 History   None    This chart was scribed for Veryl Speak, MD by Forrestine Him, ED Scribe. This patient was seen in room B19C/B19C and the patient's care was started 11:31 PM.   Chief Complaint  Patient presents with  . Back Pain   Patient is a 39 y.o. female presenting with back pain. The history is provided by the patient. No language interpreter was used.  Back Pain Location:  Lumbar spine Quality:  Shooting, stabbing and aching Radiates to:  L thigh and L foot Pain severity:  Severe Pain is:  Same all the time Timing:  Constant Progression:  Unchanged Chronicity:  Recurrent Context: not falling and not recent injury   Relieved by:  Nothing Worsened by:  Movement Ineffective treatments:  Narcotics Associated symptoms: no fever     HPI Comments: Gina [name] is a 39 y.o. female with a PMHx of GERD and fibromyalgia who presents to the Emergency Department complaining of constant, ongoing, severe L sided lower back pain that radiates down to the L lower extremity into her L foot x 2 days. Pain is exacerbated with certain positions without any alleviating factors. Pt was evaluated by [facility] 2 weeks ago for same and had MRI performed. Pt was diagnosed with muscle spasms which per boyfriend states is compressing a nerve causing her discomfort. At time of discharge, pt was prescribed a Prednisone taper and pain medication to help manage symptoms. However, pt recently ran out of pain medication a few days ago. No bowel or urinary incontinence. She denies any fever or chills at this time. Pt with several known allergies to medications as listed below.  Past Medical History  Diagnosis Date  . ABDOMINAL WALL HERNIA 02/27/2010  . ALLERGIC RHINITIS 06/02/2007  . ANOREXIA, CHRONIC 08/07/2008  . ANXIETY 09/15/2010  . BACK PAIN, THORACIC REGION 07/07/2007  . CERVICAL RADICULOPATHY 12/11/2008  . Condyloma acuminatum 04/23/2009  . CONSTIPATION  09/15/2010  . DYSPHAGIA UNSPECIFIED 09/09/2009  . 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 ENDOMETRIOSIS 12/16/2009  . FATIGUE 06/11/2010  . GERD 10/23/2009  . HERPES SIMPLEX INFECTION 06/11/2010  . LENTIGO 04/23/2009  . LUNG NODULE 06/17/2010  . Palpitations 10/23/2009  . Stricture and stenosis of esophagus 05/13/2010  . TOBACCO ABUSE 08/07/2008  . Heart palpitations   . Urinary tract infection   . Rheumatoid arthritis(714.0)   . Ovarian cyst   . Abnormal Pap smear   . Fibromyalgia   . Narcotic abuse, continuous   . Panic disorder    Past Surgical History  Procedure Laterality Date  . Partial hysterectomy    . Endometrial ablation    . Hernia repair      X3  . Dilation and curettage of uterus      x1   . Cesarean section      x2   . Esophageal dilatation x 4    . Gum surgery    . Abdominal surgery    . Abdominal wall mesh  removal     Family History  Problem Relation Age of Onset  . Depression Mother   . Arthritis Mother   . Hyperlipidemia Father   . Hypertension Father    History  Substance Use Topics  . Smoking status: Current Every Day Smoker -- 0.50 packs/day    Types: Cigarettes  . Smokeless tobacco: Never Used  . Alcohol Use: No     Comment: has odor of etoh on  breath   OB History    Gravida Para Term Preterm AB TAB SAB Ectopic Multiple Living   3 2 2  0 1 0 1 0 0 2     Review of Systems  Constitutional: Negative for fever.  Musculoskeletal: Positive for back pain.      Allergies  Lamictal; Nicotine; Zofran; Chantix; Codeine; Ibuprofen; Metaxalone; Morphine and related; Ondansetron; Skelaxin; Sulfa antibiotics; Toradol; Amoxicillin; and Penicillins  Home Medications   Prior to Admission medications   Medication Sig Start Date End Date Taking? Authorizing Provider  acetaminophen (TYLENOL) 325 MG tablet Take 325 mg by mouth every 4 (four) hours as needed for pain.     Historical Provider, MD  ALPRAZolam Duanne Moron) 1 MG tablet Take 1 mg by mouth 4 (four) times  daily as needed for anxiety.    Historical Provider, MD  escitalopram (LEXAPRO) 20 MG tablet Take 20 mg by mouth daily.     Historical Provider, MD  lidocaine (XYLOCAINE) 5 % ointment Apply 1 application topically as needed. Patient not taking: Reported on 01/09/2015 11/20/14   Waynetta Pean, PA-C  methocarbamol (ROBAXIN) 500 MG tablet Take 500 mg by mouth 3 (three) times daily as needed for muscle spasms.  05/01/13   Fransico Meadow, PA-C  Norethin Ace-Eth Estrad-FE (MINASTRIN 24 FE PO) Take by mouth. Take one by mouth daily.    Historical Provider, MD  oxyCODONE-acetaminophen (PERCOCET) 10-325 MG per tablet Take 1 tablet by mouth every 6 (six) hours as needed for pain.    Historical Provider, MD  polyethylene glycol (MIRALAX / GLYCOLAX) packet Take 17 g by mouth daily. 11/16/14   Julianne Rice, MD  predniSONE (DELTASONE) 10 MG tablet Take 4 tablets x 2 days, 3 tablets x 2 days, 2 tablets x 2 days and 1 tablet x 2 days. 01/23/15   Golden Circle, FNP  zolpidem (AMBIEN) 5 MG tablet Take 5 mg by mouth at bedtime as needed for sleep.    Historical Provider, MD   Triage Vitals: BP 108/71 mmHg  Pulse 100  Resp 22  Ht 5\' 7"  (1.702 m)  Wt 152 lb (68.947 kg)  BMI 23.80 kg/m2  SpO2 96%   Physical Exam  Constitutional: She is oriented to person, place, and time. She appears well-developed and well-nourished. No distress.  HENT:  Head: Normocephalic and atraumatic.  Eyes: EOM are normal.  Neck: Normal range of motion.  Cardiovascular: Normal rate, regular rhythm and normal heart sounds.   Pulmonary/Chest: Effort normal and breath sounds normal.  Abdominal: Soft. She exhibits no distension. There is no tenderness.  Musculoskeletal: Normal range of motion. She exhibits tenderness.  There is Tenderness to palpation in L upper buttock/lumbar region.   Neurological: She is alert and oriented to person, place, and time.  DTRs are trace and symmetrical in the BLE. strength is 5/5 in BLE.  Skin: Skin is  warm and dry.  Psychiatric: She has a normal mood and affect. Judgment normal.  Nursing note and vitals reviewed.   ED Course  Procedures (including critical care time)  DIAGNOSTIC STUDIES: Oxygen Saturation is 96% on RA, adequatge by my interpretation.    COORDINATION OF CARE: 11:31 PM-Discussed treatment plan with pt at bedside and pt agreed to plan.     Labs Review Labs Reviewed - No data to display  Imaging Review No results found.   EKG Interpretation None      MDM   Final diagnoses:  None    Patient presents with complaints of  pain in her left buttock radiating down into her left leg. She tells me she had an MRI done one month ago at [facility] which revealed an inflamed nerve. I reviewed this report and it is basically normal.  She has a history of chronic pain related to her back, fibromyalgia, and pelvic pain. She was given pain medication and steroids in the ER and is now feeling better. I believe she is appropriate for discharge per she will be prescribed another prednisone taper which she can take until she follows up with her neurologist in the next couple of days as scheduled.  I see no red flags on her exam, history, or recent MRI it would suggest there is an emergent situation.  I personally performed the services described in this documentation, which was scribed in my presence. The recorded information has been reviewed and is accurate.      Veryl Speak, MD 02/03/15 [phone]

## 2015-02-02 NOTE — ED Notes (Signed)
ED tech went into room to hook pt up and get her in a gown, pt refused both.

## 2015-02-02 NOTE — ED Notes (Addendum)
Pt c/o severe back pain hx of sciatica-- states was seen at [facility] last week for same-- "muscle spasms that push against a disc causing severe pain" has a conduction test scheduled for Tuesday. Has been taking prednisone -- 8 day dose pak=-- States "nothing helps except phenergan, dilaudid, and ativan"

## 2015-02-02 NOTE — ED Notes (Signed)
Pt yelling, crying, laying prone on triage chair-- pt unable to get comfortable

## 2015-02-03 MED ORDER — PREDNISONE 20 MG PO TABS: ORAL_TABLET | ORAL | Status: DC

## 2015-02-03 MED ORDER — HYDROMORPHONE HCL 1 MG/ML IJ SOLN
1.0000 mg | Freq: Once | INTRAMUSCULAR | Status: AC
  Administered 2015-02-03: 1 mg via INTRAVENOUS
  Filled 2015-02-03: qty 1

## 2015-02-03 NOTE — Discharge Instructions (Signed)
Prednisone as prescribed.  Continue your home pain medications as before as needed for pain.   Sciatica Sciatica is pain, weakness, numbness, or tingling along the path of the sciatic nerve. The nerve starts in the lower back and runs down the back of each leg. The nerve controls the muscles in the lower leg and in the back of the knee, while also providing sensation to the back of the thigh, lower leg, and the sole of your foot. Sciatica is a symptom of another medical condition. For instance, nerve damage or certain conditions, such as a herniated disk or bone spur on the spine, pinch or put pressure on the sciatic nerve. This causes the pain, weakness, or other sensations normally associated with sciatica. Generally, sciatica only affects one side of the body. CAUSES   Herniated or slipped disc.  Degenerative disk disease.  A pain disorder involving the narrow muscle in the buttocks (piriformis syndrome).  Pelvic injury or fracture.  Pregnancy.  Tumor (rare). SYMPTOMS  Symptoms can vary from mild to very severe. The symptoms usually travel from the low back to the buttocks and down the back of the leg. Symptoms can include:  Mild tingling or dull aches in the lower back, leg, or hip.  Numbness in the back of the calf or sole of the foot.  Burning sensations in the lower back, leg, or hip.  Sharp pains in the lower back, leg, or hip.  Leg weakness.  Severe back pain inhibiting movement. These symptoms may get worse with coughing, sneezing, laughing, or prolonged sitting or standing. Also, being overweight may worsen symptoms. DIAGNOSIS  Your caregiver will perform a physical exam to look for common symptoms of sciatica. He or she may ask you to do certain movements or activities that would trigger sciatic nerve pain. Other tests may be performed to find the cause of the sciatica. These may include:  Blood tests.  X-rays.  Imaging tests, such as an MRI or CT  scan. TREATMENT  Treatment is directed at the cause of the sciatic pain. Sometimes, treatment is not necessary and the pain and discomfort goes away on its own. If treatment is needed, your caregiver may suggest:  Over-the-counter medicines to relieve pain.  Prescription medicines, such as anti-inflammatory medicine, muscle relaxants, or narcotics.  Applying heat or ice to the painful area.  Steroid injections to lessen pain, irritation, and inflammation around the nerve.  Reducing activity during periods of pain.  Exercising and stretching to strengthen your abdomen and improve flexibility of your spine. Your caregiver may suggest losing weight if the extra weight makes the back pain worse.  Physical therapy.  Surgery to eliminate what is pressing or pinching the nerve, such as a bone spur or part of a herniated disk. HOME CARE INSTRUCTIONS   Only take over-the-counter or prescription medicines for pain or discomfort as directed by your caregiver.  Apply ice to the affected area for 20 minutes, 3-4 times a day for the first 48-72 hours. Then try heat in the same way.  Exercise, stretch, or perform your usual activities if these do not aggravate your pain.  Attend physical therapy sessions as directed by your caregiver.  Keep all follow-up appointments as directed by your caregiver.  Do not wear high heels or shoes that do not provide proper support.  Check your mattress to see if it is too soft. A firm mattress may lessen your pain and discomfort. SEEK IMMEDIATE MEDICAL CARE IF:   You lose control  of your bowel or bladder (incontinence).  You have increasing weakness in the lower back, pelvis, buttocks, or legs.  You have redness or swelling of your back.  You have a burning sensation when you urinate.  You have pain that gets worse when you lie down or awakens you at night.  Your pain is worse than you have experienced in the past.  Your pain is lasting longer than 4  weeks.  You are suddenly losing weight without reason. MAKE SURE YOU:  Understand these instructions.  Will watch your condition.  Will get help right away if you are not doing well or get worse. Document Released: 10/12/2001 Document Revised: 04/18/2012 Document Reviewed: 02/27/2012 [facility] Patient Information 2015 [place], [place]. This information is not intended to replace advice given to you by your health care provider. Make sure you discuss any questions you have with your health care provider.

## 2015-03-03 ENCOUNTER — Other Ambulatory Visit: Payer: Self-pay | Admitting: Obstetrics & Gynecology

## 2015-03-04 LAB — CYTOLOGY - PAP

## 2015-03-12 ENCOUNTER — Encounter (HOSPITAL_COMMUNITY): Payer: Self-pay | Admitting: *Deleted

## 2015-03-12 ENCOUNTER — Emergency Department (HOSPITAL_COMMUNITY)
Admission: EM | Admit: 2015-03-12 | Discharge: 2015-03-12 | Disposition: A | Discharge status: Home / Self Care | Payer: BLUE CROSS/BLUE SHIELD | Attending: Emergency Medicine | Admitting: Emergency Medicine

## 2015-03-12 DIAGNOSIS — R51 Headache: Secondary | ICD-10-CM | POA: Diagnosis not present

## 2015-03-12 DIAGNOSIS — Z8619 Personal history of other infectious and parasitic diseases: Secondary | ICD-10-CM | POA: Insufficient documentation

## 2015-03-12 DIAGNOSIS — M069 Rheumatoid arthritis, unspecified: Secondary | ICD-10-CM | POA: Insufficient documentation

## 2015-03-12 DIAGNOSIS — R519 Headache, unspecified: Secondary | ICD-10-CM

## 2015-03-12 DIAGNOSIS — Z720 Tobacco use: Secondary | ICD-10-CM | POA: Insufficient documentation

## 2015-03-12 DIAGNOSIS — Z8739 Personal history of other diseases of the musculoskeletal system and connective tissue: Secondary | ICD-10-CM | POA: Diagnosis not present

## 2015-03-12 DIAGNOSIS — Z8742 Personal history of other diseases of the female genital tract: Secondary | ICD-10-CM | POA: Diagnosis not present

## 2015-03-12 DIAGNOSIS — M797 Fibromyalgia: Secondary | ICD-10-CM | POA: Insufficient documentation

## 2015-03-12 DIAGNOSIS — Z79899 Other long term (current) drug therapy: Secondary | ICD-10-CM | POA: Insufficient documentation

## 2015-03-12 DIAGNOSIS — Z880 Allergy status to penicillin: Secondary | ICD-10-CM | POA: Diagnosis not present

## 2015-03-12 DIAGNOSIS — F410 Panic disorder [episodic paroxysmal anxiety] without agoraphobia: Secondary | ICD-10-CM | POA: Diagnosis not present

## 2015-03-12 DIAGNOSIS — Z87440 Personal history of urinary (tract) infections: Secondary | ICD-10-CM | POA: Insufficient documentation

## 2015-03-12 DIAGNOSIS — Z8709 Personal history of other diseases of the respiratory system: Secondary | ICD-10-CM | POA: Diagnosis not present

## 2015-03-12 DIAGNOSIS — K5900 Constipation, unspecified: Secondary | ICD-10-CM | POA: Diagnosis not present

## 2015-03-12 MED ORDER — KETOROLAC TROMETHAMINE 30 MG/ML IJ SOLN
30.0000 mg | Freq: Once | INTRAMUSCULAR | Status: AC
  Administered 2015-03-12: 30 mg via INTRAVENOUS
  Filled 2015-03-12: qty 1

## 2015-03-12 MED ORDER — METOCLOPRAMIDE HCL 5 MG/ML IJ SOLN
10.0000 mg | Freq: Once | INTRAMUSCULAR | Status: AC
  Administered 2015-03-12: 10 mg via INTRAVENOUS
  Filled 2015-03-12: qty 2

## 2015-03-12 MED ORDER — DIPHENHYDRAMINE HCL 25 MG PO CAPS
25.0000 mg | ORAL_CAPSULE | Freq: Once | ORAL | Status: AC
  Administered 2015-03-12: 25 mg via ORAL
  Filled 2015-03-12: qty 1

## 2015-03-12 NOTE — Discharge Instructions (Signed)
Please monitor for return of headache symptoms. Please follow-up with primary care provider if headaches continue to persist, please return to the emergency room if concerning signs or symptoms present.

## 2015-03-12 NOTE — ED Notes (Signed)
PA at the bedside.

## 2015-03-12 NOTE — ED Provider Notes (Signed)
CSN: [id number]     Arrival date & time 03/12/15  1840 History   First MD Initiated Contact with Patient 03/12/15 1841     Chief Complaint  Patient presents with  . Headache    HPI   39 year old female presents today with 4 days of headache. Patient reports that she ran into a pole hitting the anterior portion of her head; no obvious signs of trauma She reports she slowly developed frontal headache that has continued to progress. She reports this headache is similar to previous episodes but slightly worse. She denies changes in her vision, changes in taste or smell, focal neurological deficits, neck stiffness, fever,  syncope or loss of consciousness, nausea or vomiting. She reports that she was seen at urgent care today who recommended she follow-up in the emergency room for further evaluation and management. Patient reports that she has a history of fibromyalgia and takes Percocet for this, she took the Percocet today without relief of headache. Patient denies any other associated symptoms or complaints today in addition to those noted above.  Past Medical History  Diagnosis Date  . ABDOMINAL WALL HERNIA 02/27/2010  . ALLERGIC RHINITIS 06/02/2007  . ANOREXIA, CHRONIC 08/07/2008  . ANXIETY 09/15/2010  . BACK PAIN, THORACIC REGION 07/07/2007  . CERVICAL RADICULOPATHY 12/11/2008  . Condyloma acuminatum 04/23/2009  . CONSTIPATION 09/15/2010  . DYSPHAGIA UNSPECIFIED 09/09/2009  . 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 ENDOMETRIOSIS 12/16/2009  . FATIGUE 06/11/2010  . GERD 10/23/2009  . HERPES SIMPLEX INFECTION 06/11/2010  . LENTIGO 04/23/2009  . LUNG NODULE 06/17/2010  . Palpitations 10/23/2009  . Stricture and stenosis of esophagus 05/13/2010  . TOBACCO ABUSE 08/07/2008  . Heart palpitations   . Urinary tract infection   . Rheumatoid arthritis(714.0)   . Ovarian cyst   . Abnormal Pap smear   . Fibromyalgia   . Narcotic abuse, continuous   . Panic disorder    Past Surgical History  Procedure  Laterality Date  . Partial hysterectomy    . Endometrial ablation    . Hernia repair      X3  . Dilation and curettage of uterus      x1   . Cesarean section      x2   . Esophageal dilatation x 4    . Gum surgery    . Abdominal surgery    . Abdominal wall mesh  removal     Family History  Problem Relation Age of Onset  . Depression Mother   . Arthritis Mother   . Hyperlipidemia Father   . Hypertension Father    History  Substance Use Topics  . Smoking status: Current Every Day Smoker -- 0.50 packs/day    Types: Cigarettes  . Smokeless tobacco: Never Used  . Alcohol Use: No     Comment: has odor of etoh on breath   OB History    Gravida Para Term Preterm AB TAB SAB Ectopic Multiple Living   3 2 2  0 1 0 1 0 0 2     Review of Systems  All other systems reviewed and are negative.     Allergies  Lamictal; Nicotine; Zofran; Chantix; Codeine; Ibuprofen; Metaxalone; Morphine and related; Ondansetron; Skelaxin; Sulfa antibiotics; Toradol; Amoxicillin; and Penicillins  Home Medications   Prior to Admission medications   Medication Sig Start Date End Date Taking? Authorizing Provider  acetaminophen (TYLENOL) 325 MG tablet Take 325 mg by mouth every 4 (four) hours as needed for pain.     Historical  Provider, MD  ALPRAZolam Duanne Moron) 1 MG tablet Take 1 mg by mouth 4 (four) times daily as needed for anxiety.    Historical Provider, MD  escitalopram (LEXAPRO) 20 MG tablet Take 10 mg by mouth daily.     Historical Provider, MD  lidocaine (XYLOCAINE) 5 % ointment Apply 1 application topically as needed. Patient not taking: Reported on 01/09/2015 11/20/14   Waynetta Pean, PA-C  methocarbamol (ROBAXIN) 500 MG tablet Take 500 mg by mouth 3 (three) times daily as needed for muscle spasms.  05/01/13   Fransico Meadow, PA-C  Norethin Ace-Eth Estrad-FE (MINASTRIN 24 FE PO) Take by mouth. Take one by mouth daily.    Historical Provider, MD  oxyCODONE-acetaminophen (PERCOCET) 10-325 MG per  tablet Take 1 tablet by mouth every 6 (six) hours as needed for pain.    Historical Provider, MD  polyethylene glycol (MIRALAX / GLYCOLAX) packet Take 17 g by mouth daily. Patient not taking: Reported on 02/02/2015 11/16/14   Julianne Rice, MD  predniSONE (DELTASONE) 20 MG tablet 3 Tabs PO Days 1-3, then 2 tabs PO Days 4-6, then 1 tab PO Day 7-9, then Half Tab PO Day 10-12 02/03/15   Veryl Speak, MD  zolpidem (AMBIEN) 5 MG tablet Take 5 mg by mouth at bedtime as needed for sleep.    Historical Provider, MD   BP 94/60 mmHg  Pulse 66  Temp(Src) 99.3 F (37.4 C) (Oral)  Resp 18  SpO2 98% Physical Exam  Constitutional: She is oriented to person, place, and time. She appears well-developed and well-nourished.  HENT:  Head: Normocephalic and atraumatic.  No signs of trauma  Eyes: Pupils are equal, round, and reactive to light.  Neck: Normal range of motion. Neck supple. No JVD present. No tracheal deviation present. No thyromegaly present.  Cardiovascular: Normal rate, regular rhythm, normal heart sounds and intact distal pulses.  Exam reveals no gallop and no friction rub.   No murmur heard. Pulmonary/Chest: Effort normal and breath sounds normal. No stridor. No respiratory distress. She has no wheezes. She has no rales. She exhibits no tenderness.  Musculoskeletal: Normal range of motion.  Lymphadenopathy:    She has no cervical adenopathy.  Neurological: She is alert and oriented to person, place, and time. She has normal strength. No cranial nerve deficit or sensory deficit. She displays a negative Romberg sign. Coordination and gait normal. GCS eye subscore is 4. GCS verbal subscore is 5. GCS motor subscore is 6.  Reflex Scores:      Patellar reflexes are 2+ on the right side and 2+ on the left side. Skin: Skin is warm and dry.  Psychiatric: She has a normal mood and affect. Her behavior is normal. Judgment and thought content normal.  Nursing note and vitals reviewed.   ED Course   Procedures (including critical care time) Labs Review Labs Reviewed - No data to display  Imaging Review No results found.   EKG Interpretation None      MDM   Final diagnoses:  Nonintractable headache, unspecified chronicity pattern, unspecified headache type    Imaging: none indicated   Consults: None  Therapeutics: Benadryl, Reglan, Toradol  Assessment: Headache  Plan: Patient presents with headache of gradual onset. She reports it as frontal, similar to previous headaches, denies fever, has a supple neck and a negative neuro exam. This is unlikely subarachnoid, meningitis, or any other acute concerning headache that would require further imaging. Patient responded to medications in the ED with significant improvement in her  headache symptoms. Patient was given strict return precautions and the event the symptoms returned or worsened. He was encouraged follow-up with primary care provider for further evaluation and management of headaches. Patient had no questions or concerns at time of discharge, repeat neuro exam was negative, patient was able to ambulate without difficulty.      Okey Regal, PA-C 03/14/15 [id number]  Carmin Muskrat, MD 03/14/15 2142

## 2015-03-12 NOTE — ED Notes (Signed)
Patient called staff back to room to report, "i still have a headache, but i can look around now without the headache".  Husband present at the bedside now. Provided snack as requested.

## 2015-03-12 NOTE — ED Notes (Signed)
Pt c/o HA since 4 days when she ran into a pole.  Pt is whispering because she states sound hurts her ears.  Pt has sunglasses down.  Pt states her head hurts all over like migraine.  CBG 80. States urgent care sent her to ER

## 2015-03-14 ENCOUNTER — Ambulatory Visit: Payer: BLUE CROSS/BLUE SHIELD | Admitting: Physical Therapy

## 2015-03-19 ENCOUNTER — Ambulatory Visit: Payer: BLUE CROSS/BLUE SHIELD | Admitting: Physical Therapy

## 2015-03-24 ENCOUNTER — Ambulatory Visit: Payer: BLUE CROSS/BLUE SHIELD | Attending: Neurology | Admitting: Physical Therapy

## 2015-03-24 ENCOUNTER — Encounter: Payer: Self-pay | Admitting: Physical Therapy

## 2015-03-24 DIAGNOSIS — M545 Low back pain, unspecified: Secondary | ICD-10-CM

## 2015-03-24 DIAGNOSIS — R1030 Lower abdominal pain, unspecified: Secondary | ICD-10-CM | POA: Insufficient documentation

## 2015-03-24 DIAGNOSIS — M542 Cervicalgia: Secondary | ICD-10-CM | POA: Diagnosis not present

## 2015-03-24 DIAGNOSIS — R2689 Other abnormalities of gait and mobility: Secondary | ICD-10-CM | POA: Diagnosis not present

## 2015-03-24 DIAGNOSIS — R531 Weakness: Secondary | ICD-10-CM | POA: Diagnosis not present
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
## 2015-04-03 ENCOUNTER — Encounter: Payer: Self-pay | Admitting: Physical Therapy

## 2015-04-03 ENCOUNTER — Ambulatory Visit: Payer: BLUE CROSS/BLUE SHIELD | Attending: Neurology | Admitting: Physical Therapy

## 2015-04-03 DIAGNOSIS — M542 Cervicalgia: Secondary | ICD-10-CM | POA: Diagnosis present

## 2015-04-03 DIAGNOSIS — R2689 Other abnormalities of gait and mobility: Secondary | ICD-10-CM | POA: Insufficient documentation

## 2015-04-03 DIAGNOSIS — R1030 Lower abdominal pain, unspecified: Secondary | ICD-10-CM | POA: Diagnosis present

## 2015-04-03 NOTE — Therapy (Signed)
[facility] [address] [place], [place], [address] Phone: [phone]   Fax:  [phone]  Physical Therapy Treatment  Patient Details  Name: Gina [name] MRN: [id number] Date of Birth: 06-May-1976 Referring Provider:  Kerin Perna., MD  Encounter Date: 04/03/2015      PT End of Session - 04/03/15 1113    Visit Number 2   Date for PT Re-Evaluation 05/19/15   PT Start Time 1114   PT Stop Time 4270   PT Time Calculation (min) 31 min   Activity Tolerance Patient limited by pain;Other (comment)   Behavior During Therapy Anxious      Past Medical History  Diagnosis Date  . ABDOMINAL WALL HERNIA 02/27/2010  . ALLERGIC RHINITIS 06/02/2007  . ANOREXIA, CHRONIC 08/07/2008  . ANXIETY 09/15/2010  . BACK PAIN, THORACIC REGION 07/07/2007  . CERVICAL RADICULOPATHY 12/11/2008  . Condyloma acuminatum 04/23/2009  . CONSTIPATION 09/15/2010  . DYSPHAGIA UNSPECIFIED 09/09/2009  . 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 ENDOMETRIOSIS 12/16/2009  . FATIGUE 06/11/2010  . GERD 10/23/2009  . HERPES SIMPLEX INFECTION 06/11/2010  . LENTIGO 04/23/2009  . LUNG NODULE 06/17/2010  . Palpitations 10/23/2009  . Stricture and stenosis of esophagus 05/13/2010  . TOBACCO ABUSE 08/07/2008  . Heart palpitations   . Urinary tract infection   . Rheumatoid arthritis(714.0)   . Ovarian cyst   . Abnormal Pap smear   . Fibromyalgia   . Narcotic abuse, continuous   . Panic disorder     Past Surgical History  Procedure Laterality Date  . Partial hysterectomy    . Endometrial ablation    . Hernia repair      X3  . Dilation and curettage of uterus      x1   . Cesarean section      x2   . Esophageal dilatation x 4    . Gum surgery    . Abdominal surgery    . Abdominal wall mesh  removal      There were no vitals filed for this visit.  Visit Diagnosis:  Lower abdominal pain      Subjective Assessment - 04/03/15 1115    Subjective pain in  shoulder and cervcial   How long can you sit comfortably? 30 min   How long can you stand comfortably? 0 min   How long can you walk comfortably? 0 min   Patient Stated Goals get stronger to feel better   Currently in Pain? Yes   Pain Score 5    Pain Location Neck  upper back   Pain Orientation Right;Left;Mid   Pain Type Chronic pain   Pain Onset More than a month ago   Pain Frequency Constant   Aggravating Factors  cleaning, walking   Pain Relieving Factors TENS unit, medication, sitting in recliner   Multiple Pain Sites No                         [facility] Adult PT Treatment/Exercise - 04/03/15 0001    Posture/Postural Control   Posture/Postural Control Postural limitations   Posture Comments instructed patiient on correct body mechanics  in sitting and standing   Manual Therapy   Manual Therapy Soft tissue mobilization   Soft tissue mobilization to cervical paraspinals, suboccipitals, interscapular area, left scapula                PT Education - 04/03/15 1125    Education provided Yes  Education Details cervical ROM exercises   Person(s) Educated Patient   Methods Explanation;Demonstration;Verbal cues;Handout;Tactile cues   Comprehension Verbalized understanding;Returned demonstration          PT Short Term Goals - 04/03/15 1148    PT SHORT TERM GOAL #1   Title understand proper posture to reduce strain on back and pelvic floor   Time 4   Period Weeks   Status New  needs many verbal cues   PT SHORT TERM GOAL #2   Title ablity to move in bed with >/= 25% greater ease   Time 4   Period Weeks   Status New  just started   PT SHORT TERM GOAL #3   Title perform daily acitivities with energy conservation tasks with >/= 25% greater ease   Time 4   Period Weeks   Status New  just started           PT Long Term Goals - 03/24/15 1713    PT LONG TERM GOAL #1   Title perform daily tasks with >/= 50% greater ease due to improved mobility  of spine and hips   Time 8   Period Weeks   Status New   PT LONG TERM GOAL #2   Title perform bed mobility with >/= 50% greater ease   Time 8   Period Weeks   Status New   PT LONG TERM GOAL #3   Title understand ways to manage pain using relaxation techniques, guided imagery, and yoga   Time 8   Period Weeks   Status New   PT LONG TERM GOAL #4   Title left leg gives out 50% less due to improved stability of left leg and core   Time 8   Period Weeks   Status New   PT LONG TERM GOAL #5   Title lower abdominal and pelvic pain decreased to moderate /minimal level so she have increased endurance >/= 40%   Time 8   Period Weeks   Status New               Plan - 04/03/15 1146    Clinical Impression Statement Patient was 15 minutes late.  Patient has increased pain in cervical and shoulders today.  Patient has not met goals due to being late and just attending the initial evaluation.  Patient has trigger points in cervical and interscapular area.  Patient cervical ROM is limited due to mobility and being scared to move with pain.    Pt will benefit from skilled therapeutic intervention in order to improve on the following deficits Decreased coordination;Decreased range of motion;Difficulty walking;Increased fascial restricitons;Increased muscle spasms;Decreased endurance;Decreased activity tolerance;Pain;Impaired flexibility;Decreased mobility;Decreased strength   Rehab Potential Good   PT Frequency 2x / week   PT Duration 8 weeks   PT Treatment/Interventions Cryotherapy;Electrical Stimulation;Moist Heat;Ultrasound;Neuromuscular re-education;Therapeutic exercise;Therapeutic activities;Functional mobility training;Patient/family education;Manual techniques;Taping;Energy conservation;Passive range of motion   PT Next Visit Plan work on movement, soft tissue work, modalities for pain    PT Home Exercise Plan movement, diaphragmatic breathing   Consulted and Agree with Plan of Care  Patient        Problem List Patient Active Problem List   Diagnosis Date Noted  . Sciatica 01/09/2015  . Acute bronchitis 08/30/2014  . Reactive hypoglycemia 12/17/2013  . Musculoskeletal malfunction arising from mental factors 04/04/2013  . Incisional hernia 11/15/2012  . Other postprocedural status(V45.89) 11/15/2012  . Abdominal pain 11/11/2012  . Chronic pain syndrome 11/11/2012  . Disorder of  sacrum 11/11/2012  . Chronic pain 12/21/2011  . Heartburn 10/04/2011  . DYSPHAGIA 10/04/2011  . ANXIETY 09/15/2010  . CONSTIPATION 09/15/2010  . LUNG NODULE 06/17/2010  . HERPES SIMPLEX INFECTION 06/11/2010  . FATIGUE 06/11/2010  . STRICTURE AND STENOSIS OF ESOPHAGUS 05/13/2010  . ABDOMINAL WALL HERNIA 02/27/2010  . ENDOMETRIOSIS 12/16/2009  . GERD 10/23/2009  . PALPITATIONS 10/23/2009  . 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 CONDYLOMA ACUMINATUM 04/23/2009  . LENTIGO 04/23/2009  . DENTAL PAIN 03/19/2009  . CERVICAL RADICULOPATHY 12/11/2008  . 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 TOBACCO ABUSE 08/07/2008  . BACK PAIN, THORACIC REGION 07/07/2007  . ALLERGIC RHINITIS 06/02/2007    GRAY,CHERYL,PT 04/03/2015, 11:50 AM  [facility] [address] [place], [place], [address] Phone: [phone]   Fax:  [phone]

## 2015-04-03 NOTE — Patient Instructions (Addendum)
AROM: Neck Rotation  Sit with good posture with slight chin retraction.  Turn head slowly to look over one shoulder, then the other. Hold each position _5___ seconds. Repeat __3__ times per set. Do __1__ sets per session. Do __2__ sessions per day.  http://orth.exer.us/294   Copyright  VHI. All rights reserved.  AROM: Lateral Neck Flexion  Sit in good posture with chin slightly retracted.   Slowly tilt head toward one shoulder, then the other. Hold each position __5__ seconds. Repeat _3___ times per set. Do _1___ sets per session. Do _2___ sessions per day.  http://orth.exer.us/296   Copyright  VHI. All rights reserved.  AROM: Neck Extension   Bend head backward. Hold _5___ seconds. Repeat ___3_ times per set. Do __1__ sets per session. Do _2___ sessions per day.  http://orth.exer.us/300   Copyright  VHI. All rights reserved.  [facility] [address] [place], [place] [address] Phone # [phone] Fax [phone]

## 2015-04-07 ENCOUNTER — Ambulatory Visit: Payer: BLUE CROSS/BLUE SHIELD | Admitting: Physical Therapy

## 2015-04-07 ENCOUNTER — Encounter: Payer: Self-pay | Admitting: Physical Therapy

## 2015-04-07 DIAGNOSIS — R2689 Other abnormalities of gait and mobility: Secondary | ICD-10-CM

## 2015-04-07 DIAGNOSIS — M542 Cervicalgia: Secondary | ICD-10-CM

## 2015-04-07 DIAGNOSIS — R1030 Lower abdominal pain, unspecified: Secondary | ICD-10-CM | POA: Diagnosis not present

## 2015-04-07 NOTE — Therapy (Addendum)
[facility] [address] [place], [place], [address] Phone: [phone]   Fax:  [phone]  Physical Therapy Treatment  Patient Details  Name: Gina [name] MRN: [id number] Date of Birth: 1976-05-25 Referring Provider:  Kerin Perna., MD  Encounter Date: 04/07/2015      PT End of Session - 04/07/15 1153    Visit Number 3   Date for PT Re-Evaluation 05/19/15   PT Start Time 1150   PT Stop Time 3291   PT Time Calculation (min) 1435 min   Activity Tolerance Patient limited by pain;Other (comment)   Behavior During Therapy Anxious      Past Medical History  Diagnosis Date  . ABDOMINAL WALL HERNIA 02/27/2010  . ALLERGIC RHINITIS 06/02/2007  . ANOREXIA, CHRONIC 08/07/2008  . ANXIETY 09/15/2010  . BACK PAIN, THORACIC REGION 07/07/2007  . CERVICAL RADICULOPATHY 12/11/2008  . Condyloma acuminatum 04/23/2009  . CONSTIPATION 09/15/2010  . DYSPHAGIA UNSPECIFIED 09/09/2009  . 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 ENDOMETRIOSIS 12/16/2009  . FATIGUE 06/11/2010  . GERD 10/23/2009  . HERPES SIMPLEX INFECTION 06/11/2010  . LENTIGO 04/23/2009  . LUNG NODULE 06/17/2010  . Palpitations 10/23/2009  . Stricture and stenosis of esophagus 05/13/2010  . TOBACCO ABUSE 08/07/2008  . Heart palpitations   . Urinary tract infection   . Rheumatoid arthritis(714.0)   . Ovarian cyst   . Abnormal Pap smear   . Fibromyalgia   . Narcotic abuse, continuous   . Panic disorder     Past Surgical History  Procedure Laterality Date  . Partial hysterectomy    . Endometrial ablation    . Hernia repair      X3  . Dilation and curettage of uterus      x1   . Cesarean section      x2   . Esophageal dilatation x 4    . Gum surgery    . Abdominal surgery    . Abdominal wall mesh  removal      There were no vitals filed for this visit.  Visit Diagnosis:  Cervical pain  Decreased mobility      Subjective Assessment - 04/07/15 1150    Subjective Patient reports soft tissue work helped. Sleep in recliner 5 nights out of the 7 due to recliner is not flat.    How long can you sit comfortably? 30 min   How long can you stand comfortably? 0 min   How long can you walk comfortably? 0 min   Patient Stated Goals get stronger to feel better   Currently in Pain? Yes   Pain Score 4    Pain Location Back   Pain Orientation Right;Left;Upper;Mid   Pain Descriptors / Indicators Constant   Pain Type Chronic pain   Pain Onset More than a month ago   Pain Frequency Constant   Aggravating Factors  cleaning, walking   Pain Relieving Factors TENS, medication, sitting in a recliner   Multiple Pain Sites No            [facility] PT Assessment - 04/07/15 0001    AROM   Cervical Flexion decreased by 75%   Cervical Extension decreased by 75%   Cervical - Right Side Bend decreased by 75%   Cervical - Left Side Bend deceased by 75%   Cervical - Right Rotation decreased by 25%   Cervical - Left Rotation decreased by 75%  [place] [facility] PT Treatment/Exercise - 04/07/15 0001    Manual Therapy   Manual Therapy Soft tissue mobilization   Soft tissue mobilization to cervical paraspinals, suboccipitals, interscapular area, bilateral  scapula                PT Education - 04/07/15 1221    Education provided Yes   Education Details Reviewed posture; Decompression exercises   Person(s) Educated Patient   Methods Explanation;Demonstration;Tactile cues;Verbal cues   Comprehension Returned demonstration;Verbalized understanding          PT Short Term Goals - 04/07/15 1153    PT SHORT TERM GOAL #1   Title understand proper posture to reduce strain on back and pelvic floor   Time 4   Period Weeks   Status Achieved   PT SHORT TERM GOAL #2   Title ablity to move in bed with >/= 25% greater ease   Time 4   Period Weeks   Status Not Met  sleeps in recliner   PT [place] #3   Title perform  daily acitivities with energy conservation tasks with >/= 25% greater ease   Time 4   Period Weeks   Status New  no changes yet           PT Long Term Goals - 03/24/15 1713    PT LONG TERM GOAL #1   Title perform daily tasks with >/= 50% greater ease due to improved mobility of spine and hips   Time 8   Period Weeks   Status New   PT LONG TERM GOAL #2   Title perform bed mobility with >/= 50% greater ease   Time 8   Period Weeks   Status New   PT LONG TERM GOAL #3   Title understand ways to manage pain using relaxation techniques, guided imagery, and yoga   Time 8   Period Weeks   Status New   PT LONG TERM GOAL #4   Title left leg gives out 50% less due to improved stability of left leg and core   Time 8   Period Weeks   Status New   PT LONG TERM GOAL #5   Title lower abdominal and pelvic pain decreased to moderate /minimal level so she have increased endurance >/= 40%   Time 8   Period Weeks   Status New               Plan - 04/07/15 1228    Clinical Impression Statement Patient has many trigger points in the cervical and thoracic area.  Patient has difficulty with doing the decompression exercises due to pain and decreased muscle endurance.  Patient has met her posture goals and is aware of what correct posture can do for her.  Patient has met STG#1.  Paitent cervcial ROM is the same from initiatil evaluation.    Pt will benefit from skilled therapeutic intervention in order to improve on the following deficits Decreased coordination;Decreased range of motion;Difficulty walking;Increased fascial restricitons;Increased muscle spasms;Decreased endurance;Decreased activity tolerance;Pain;Impaired flexibility;Decreased mobility;Decreased strength   Rehab Potential Good   PT Frequency 2x / week   PT Duration 8 weeks   PT Treatment/Interventions Cryotherapy;Electrical Stimulation;Moist Heat;Ultrasound;Neuromuscular re-education;Therapeutic exercise;Therapeutic  activities;Functional mobility training;Patient/family education;Manual techniques;Taping;Energy conservation;Passive range of motion   PT Next Visit Plan soft tissue work, isometrics   PT Home Exercise Plan diaphragmatic breathing   Consulted and Agree with Plan of Care Patient        Problem List Patient Active  Problem List   Diagnosis Date Noted  . Sciatica 01/09/2015  . Acute bronchitis 08/30/2014  . Reactive hypoglycemia 12/17/2013  . Musculoskeletal malfunction arising from mental factors 04/04/2013  . Incisional hernia 11/15/2012  . Other postprocedural status(V45.89) 11/15/2012  . Abdominal pain 11/11/2012  . Chronic pain syndrome 11/11/2012  . Disorder of sacrum 11/11/2012  . Chronic pain 12/21/2011  . Heartburn 10/04/2011  . DYSPHAGIA 10/04/2011  . ANXIETY 09/15/2010  . CONSTIPATION 09/15/2010  . LUNG NODULE 06/17/2010  . HERPES SIMPLEX INFECTION 06/11/2010  . FATIGUE 06/11/2010  . STRICTURE AND STENOSIS OF ESOPHAGUS 05/13/2010  . ABDOMINAL WALL HERNIA 02/27/2010  . ENDOMETRIOSIS 12/16/2009  . GERD 10/23/2009  . PALPITATIONS 10/23/2009  . 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 CONDYLOMA ACUMINATUM 04/23/2009  . LENTIGO 04/23/2009  . DENTAL PAIN 03/19/2009  . CERVICAL RADICULOPATHY 12/11/2008  . 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 TOBACCO ABUSE 08/07/2008  . BACK PAIN, THORACIC REGION 07/07/2007  . ALLERGIC RHINITIS 06/02/2007    GRAY,CHERYL,PT 04/07/2015, 12:32 PM  [facility] [address], [place], [place], [address] Phone: [phone]   Fax:  [phone]     PHYSICAL THERAPY DISCHARGE SUMMARY  Visits from Start of Care: 3  Current functional level related to goals / functional outcomes: Difficulty to assess patient due to her not returning since her last visit on 04/07/2015.     Remaining deficits: Patient was having difficulty  with performing exercises due to pain.  Patient was having difficulty coming to therapy on time.    Education / Equipment: HEP  Plan:                                                    Patient goals were not met. Patient is being discharged due to not returning since the last visit.  Thank you for the referral.  Earlie Counts, PT 05/26/2015 4:57 PM \?????

## 2015-04-07 NOTE — Patient Instructions (Addendum)
RE-ALIGNMENT ROUTINE EXERCISES-OSTEOPROROSIS BASIC FOR POSTURAL CORRECTION   RE-ALIGNMENT Tips BENEFITS: 1.It helps to re-align the curves of the back and improve standing posture. 2.It allows the back muscles to rest and strengthen in preparation for more activity. FREQUENCY: Daily, even after weeks, months and years of more advanced exercises. START: 1.All exercises start in the same position: lying on the back, arms resting on the supporting surface, palms up and slightly away from the body, backs of hands down, knees bent, feet flat. 2.The head, neck, arms, and legs are supported according to specific instructions of your therapist. Copyright  VHI. All rights reserved.    1. Decompression Exercise: Basic.   Takes compression off the vertebral bodies; increases tolerance for lying on the back; helps relieve back pain   Lie on back on firm surface, knees bent, feet flat, arms turned up, out to sides (~35 degrees). Head neck and arms supported as necessary. Time _5-15__ minutes. Surface: floor    2. Shoulder Press  Strengthens upper back extensors and scapular retractors.   Press both shoulders down. Hold _2-3__ seconds. Repeat _3-5__ times. Surface: floor        3. Head Press With [place]  Strengthens neck extensors   Tuck chin SLIGHTLY toward chest, keep mouth closed. Feel weight on back of head. Increase weight by pressing head down. Hold _2-3__ seconds. Relax. Repeat 3-5___ times. Surface: floor   4. Leg Lengthener: stretches quadratus lumborum and hip flexors.  Strengthens quads and ankle dorsiflexors.   Straighten one leg. Pull toes AND forefoot toward knee, extend heel. Lengthen leg by pulling pelvis away from ribs. Hold _2-3__ seconds. Relax. Repeat 1 time. Re-bend knee. Do other leg. Each leg  4_times. Surface: floor  Copyright  VHI. All rights reserved.   5. Leg Press.   Strengthens gluteus maximus, lower erector spinae and ankle dorsiflexors.   Repeat  the above motion (Leg lengthener) and press entire leg downward.  Imagine making an impression of the leg in the sand.  Hold 2-3 seconds and relax.  Repeat on the other leg.  Each leg 4-6 times.     [facility] [address] [place], [place] [address] Phone # [phone] Fax [phone]

## 2015-04-15 ENCOUNTER — Encounter: Payer: BLUE CROSS/BLUE SHIELD | Admitting: Physical Therapy

## 2015-04-17 ENCOUNTER — Encounter: Payer: BLUE CROSS/BLUE SHIELD | Admitting: Physical Therapy

## 2015-04-21 ENCOUNTER — Ambulatory Visit: Payer: BLUE CROSS/BLUE SHIELD | Admitting: Physical Therapy

## 2015-05-01 ENCOUNTER — Ambulatory Visit: Payer: BLUE CROSS/BLUE SHIELD | Admitting: Physical Therapy

## 2015-05-14 ENCOUNTER — Emergency Department (HOSPITAL_BASED_OUTPATIENT_CLINIC_OR_DEPARTMENT_OTHER)
Admission: EM | Admit: 2015-05-14 | Discharge: 2015-05-14 | Disposition: A | Discharge status: Home / Self Care | Payer: BLUE CROSS/BLUE SHIELD | Attending: Emergency Medicine | Admitting: Emergency Medicine

## 2015-05-14 ENCOUNTER — Encounter (HOSPITAL_BASED_OUTPATIENT_CLINIC_OR_DEPARTMENT_OTHER): Payer: Self-pay

## 2015-05-14 DIAGNOSIS — M542 Cervicalgia: Secondary | ICD-10-CM | POA: Diagnosis not present

## 2015-05-14 DIAGNOSIS — Z87448 Personal history of other diseases of urinary system: Secondary | ICD-10-CM | POA: Insufficient documentation

## 2015-05-14 DIAGNOSIS — Z87440 Personal history of urinary (tract) infections: Secondary | ICD-10-CM | POA: Diagnosis not present

## 2015-05-14 DIAGNOSIS — G8929 Other chronic pain: Secondary | ICD-10-CM | POA: Diagnosis not present

## 2015-05-14 DIAGNOSIS — Z8742 Personal history of other diseases of the female genital tract: Secondary | ICD-10-CM | POA: Insufficient documentation

## 2015-05-14 DIAGNOSIS — Z872 Personal history of diseases of the skin and subcutaneous tissue: Secondary | ICD-10-CM | POA: Diagnosis not present

## 2015-05-14 DIAGNOSIS — Z8619 Personal history of other infectious and parasitic diseases: Secondary | ICD-10-CM | POA: Diagnosis not present

## 2015-05-14 DIAGNOSIS — Z720 Tobacco use: Secondary | ICD-10-CM | POA: Diagnosis not present

## 2015-05-14 DIAGNOSIS — Z880 Allergy status to penicillin: Secondary | ICD-10-CM | POA: Insufficient documentation

## 2015-05-14 DIAGNOSIS — Z8709 Personal history of other diseases of the respiratory system: Secondary | ICD-10-CM | POA: Diagnosis not present

## 2015-05-14 DIAGNOSIS — M549 Dorsalgia, unspecified: Secondary | ICD-10-CM | POA: Diagnosis present

## 2015-05-14 DIAGNOSIS — F410 Panic disorder [episodic paroxysmal anxiety] without agoraphobia: Secondary | ICD-10-CM | POA: Insufficient documentation

## 2015-05-14 DIAGNOSIS — Z79899 Other long term (current) drug therapy: Secondary | ICD-10-CM | POA: Insufficient documentation

## 2015-05-14 DIAGNOSIS — K5900 Constipation, unspecified: Secondary | ICD-10-CM | POA: Diagnosis not present

## 2015-05-14 DIAGNOSIS — Z3202 Encounter for pregnancy test, result negative: Secondary | ICD-10-CM | POA: Diagnosis not present

## 2015-05-14 LAB — URINALYSIS, ROUTINE W REFLEX MICROSCOPIC
Bilirubin Urine: NEGATIVE
GLUCOSE, UA: NEGATIVE mg/dL
HGB URINE DIPSTICK: NEGATIVE
Ketones, ur: NEGATIVE mg/dL
LEUKOCYTES UA: NEGATIVE
Nitrite: NEGATIVE
Protein, ur: NEGATIVE mg/dL
Specific Gravity, Urine: 1.015 (ref 1.005–1.030)
UROBILINOGEN UA: 0.2 mg/dL (ref 0.0–1.0)
pH: 6 (ref 5.0–8.0)

## 2015-05-14 LAB — BASIC METABOLIC PANEL
Anion gap: 5 (ref 5–15)
BUN: 10 mg/dL (ref 6–20)
CO2: 27 mmol/L (ref 22–32)
Calcium: 8.3 mg/dL — ABNORMAL LOW (ref 8.9–10.3)
Chloride: 108 mmol/L (ref 101–111)
Creatinine, Ser: 0.54 mg/dL (ref 0.44–1.00)
Glucose, Bld: 102 mg/dL — ABNORMAL HIGH (ref 65–99)
Potassium: 3.4 mmol/L — ABNORMAL LOW (ref 3.5–5.1)
Sodium: 140 mmol/L (ref 135–145)

## 2015-05-14 LAB — CBC
HEMATOCRIT: 40.9 % (ref 36.0–46.0)
Hemoglobin: 13.6 g/dL (ref 12.0–15.0)
MCH: 31.9 pg (ref 26.0–34.0)
MCHC: 33.3 g/dL (ref 30.0–36.0)
MCV: 96 fL (ref 78.0–100.0)
PLATELETS: 180 10*3/uL (ref 150–400)
RBC: 4.26 MIL/uL (ref 3.87–5.11)
RDW: 11.9 % (ref 11.5–15.5)
WBC: 6.7 10*3/uL (ref 4.0–10.5)

## 2015-05-14 LAB — PREGNANCY, URINE: PREG TEST UR: NEGATIVE

## 2015-05-14 MED ORDER — DEXAMETHASONE SODIUM PHOSPHATE 10 MG/ML IJ SOLN
10.0000 mg | Freq: Once | INTRAMUSCULAR | Status: AC
  Administered 2015-05-14: 10 mg via INTRAVENOUS
  Filled 2015-05-14: qty 1

## 2015-05-14 MED ORDER — HYDROMORPHONE HCL 1 MG/ML IJ SOLN
1.0000 mg | Freq: Once | INTRAMUSCULAR | Status: AC
  Administered 2015-05-14: 1 mg via INTRAVENOUS
  Filled 2015-05-14: qty 1

## 2015-05-14 MED ORDER — DIAZEPAM 5 MG/ML IJ SOLN: 5.0000 mg | Freq: Once | INTRAMUSCULAR | Status: DC

## 2015-05-14 MED ORDER — LORAZEPAM 2 MG/ML IJ SOLN
1.0000 mg | Freq: Once | INTRAMUSCULAR | Status: AC
  Administered 2015-05-14: 1 mg via INTRAVENOUS
  Filled 2015-05-14: qty 1

## 2015-05-14 MED ORDER — PROMETHAZINE HCL 25 MG/ML IJ SOLN
25.0000 mg | Freq: Once | INTRAMUSCULAR | Status: AC
Start: 2015-05-14 — End: 2015-05-14
  Administered 2015-05-14: 25 mg via INTRAVENOUS
  Filled 2015-05-14: qty 1

## 2015-05-14 MED ORDER — DIPHENHYDRAMINE HCL 50 MG/ML IJ SOLN
25.0000 mg | Freq: Once | INTRAMUSCULAR | Status: AC
  Administered 2015-05-14: 25 mg via INTRAVENOUS
  Filled 2015-05-14: qty 1

## 2015-05-14 NOTE — Discharge Instructions (Signed)
Rest, avoid heavy lifting or hard physical activity for the next few days. Follow up with your primary care doctor and back specialist.  Chronic Back Pain  When back pain lasts longer than 3 months, it is called chronic back pain.People with chronic back pain often go through certain periods that are more intense (flare-ups).  CAUSES Chronic back pain can be caused by wear and tear (degeneration) on different structures in your back. These structures include:  The bones of your spine (vertebrae) and the joints surrounding your spinal cord and nerve roots (facets).  The strong, fibrous tissues that connect your vertebrae (ligaments). Degeneration of these structures may result in pressure on your nerves. This can lead to constant pain. HOME CARE INSTRUCTIONS  Avoid bending, heavy lifting, prolonged sitting, and activities which make the problem worse.  Take brief periods of rest throughout the day to reduce your pain. Lying down or standing usually is better than sitting while you are resting.  Take over-the-counter or prescription medicines only as directed by your caregiver. SEEK IMMEDIATE MEDICAL CARE IF:   You have weakness or numbness in one of your legs or feet.  You have trouble controlling your bladder or bowels.  You have nausea, vomiting, abdominal pain, shortness of breath, or fainting. Document Released: 11/25/2004 Document Revised: 01/10/2012 Document Reviewed: 10/02/2011 [facility] Patient Information 2015 [place], [place]. This information is not intended to replace advice given to you by your health care provider. Make sure you discuss any questions you have with your health care provider.

## 2015-05-14 NOTE — ED Notes (Signed)
Pt IV infiltrated. Reinitiated PIV on other extremity.

## 2015-05-14 NOTE — ED Notes (Addendum)
Pt presents via ems with acute on chronic diffuse back pain following housework for several days.  N/v en route, no loss of bowel/bladder.  Given 250 mcg fentanyl en route.

## 2015-05-14 NOTE — ED Provider Notes (Signed)
CSN: [id number]     Arrival date & time 05/14/15  1731 History   First MD Initiated Contact with Patient 05/14/15 1739     Chief Complaint  Patient presents with  . Back Pain     (Consider location/radiation/quality/duration/timing/severity/associated sxs/prior Treatment) HPI Comments: 39 y/o F presenting via EMS c/o acute on chronic back pain beginning this morning. States she is in the process of moving, filling boxes, packing, lifting boxes and believes this exacerbated her pain. Pain located in her lower back, radiating across, a sensation that "all of my muscles are cramping and spasming". No relief with oxycodone. Pain rated 9/10, worse with movement. Also reports some lower neck pain and feels like the muscles around her neck are very tight. States she was unable to stand up off of her office chair and cannot walk. States she feels weak in her lower legs. Denies loss of control of bowels or bladder saddle anesthesia. Sees a neurologist for her back pain and is on chronic pain management. Previously went to physical therapy which made things worse. States she feels nauseous from the pain and has been dry heaving. Denies fever, chills, night sweats. No history of cancer or IV drug abuse. States her body "does not work right" and she is trying to "build my body back up to normal". She states "I think this is a flare of my fibromyalgia". States for this pain when she comes to the ED, "the only thing that works for me is Dilaudid, Phenergan and Ativan".  The history is provided by the patient and the EMS personnel.    Past Medical History  Diagnosis Date  . ABDOMINAL WALL HERNIA 02/27/2010  . ALLERGIC RHINITIS 06/02/2007  . ANOREXIA, CHRONIC 08/07/2008  . ANXIETY 09/15/2010  . BACK PAIN, THORACIC REGION 07/07/2007  . CERVICAL RADICULOPATHY 12/11/2008  . Condyloma acuminatum 04/23/2009  . CONSTIPATION 09/15/2010  . DYSPHAGIA UNSPECIFIED 09/09/2009  . 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 ENDOMETRIOSIS  12/16/2009  . FATIGUE 06/11/2010  . GERD 10/23/2009  . HERPES SIMPLEX INFECTION 06/11/2010  . LENTIGO 04/23/2009  . LUNG NODULE 06/17/2010  . Palpitations 10/23/2009  . Stricture and stenosis of esophagus 05/13/2010  . TOBACCO ABUSE 08/07/2008  . Heart palpitations   . Urinary tract infection   . Rheumatoid arthritis(714.0)   . Ovarian cyst   . Abnormal Pap smear   . Fibromyalgia   . Narcotic abuse, continuous   . Panic disorder    Past Surgical History  Procedure Laterality Date  . Partial hysterectomy    . Endometrial ablation    . Hernia repair      X3  . Dilation and curettage of uterus      x1   . Cesarean section      x2   . Esophageal dilatation x 4    . Gum surgery    . Abdominal surgery    . Abdominal wall mesh  removal     Family History  Problem Relation Age of Onset  . Depression Mother   . Arthritis Mother   . Hyperlipidemia Father   . Hypertension Father    History  Substance Use Topics  . Smoking status: Current Every Day Smoker -- 1.00 packs/day    Types: Cigarettes  . Smokeless tobacco: Never Used  . Alcohol Use: No     Comment: has odor of etoh on breath   OB History    Gravida Para Term Preterm AB TAB SAB Ectopic Multiple Living   3 2  2 0 1 0 1 0 0 2     Review of Systems  Musculoskeletal: Positive for back pain, gait problem and neck pain.  All other systems reviewed and are negative.     Allergies  Lamictal; Nicotine; Zofran; Chantix; Codeine; Ibuprofen; Metaxalone; Morphine and related; Ondansetron; Skelaxin; Sulfa antibiotics; Toradol; Amoxicillin; and Penicillins  Home Medications   Prior to Admission medications   Medication Sig Start Date End Date Taking? Authorizing Provider  acetaminophen (TYLENOL) 325 MG tablet Take 325 mg by mouth every 4 (four) hours as needed for pain.     Historical Provider, MD  ALPRAZolam Duanne Moron) 1 MG tablet Take 1 mg by mouth 4 (four) times daily as needed for anxiety.    Historical Provider, MD   escitalopram (LEXAPRO) 20 MG tablet Take 10 mg by mouth daily.     Historical Provider, MD  lidocaine (XYLOCAINE) 5 % ointment Apply 1 application topically as needed. Patient not taking: Reported on 01/09/2015 11/20/14   Waynetta Pean, PA-C  methocarbamol (ROBAXIN) 500 MG tablet Take 500 mg by mouth 3 (three) times daily as needed for muscle spasms.  05/01/13   Fransico Meadow, PA-C  Norethin Ace-Eth Estrad-FE (MINASTRIN 24 FE PO) Take by mouth. Take one by mouth daily.    Historical Provider, MD  oxyCODONE-acetaminophen (PERCOCET) 10-325 MG per tablet Take 1 tablet by mouth every 6 (six) hours as needed for pain.    Historical Provider, MD  polyethylene glycol (MIRALAX / GLYCOLAX) packet Take 17 g by mouth daily. Patient not taking: Reported on 02/02/2015 11/16/14   Julianne Rice, MD  predniSONE (DELTASONE) 20 MG tablet 3 Tabs PO Days 1-3, then 2 tabs PO Days 4-6, then 1 tab PO Day 7-9, then Half Tab PO Day 10-12 02/03/15   Veryl Speak, MD  zolpidem (AMBIEN) 5 MG tablet Take 5 mg by mouth at bedtime as needed for sleep.    Historical Provider, MD   BP 102/65 mmHg  Pulse 77  Temp(Src) 98.8 F (37.1 C) (Oral)  Resp 18  Ht 5\' 7"  (1.702 m)  Wt 140 lb (63.504 kg)  BMI 21.92 kg/m2  SpO2 100% Physical Exam  Constitutional: She is oriented to person, place, and time. She appears well-developed and well-nourished. No distress.  HENT:  Head: Normocephalic and atraumatic.  Mouth/Throat: Oropharynx is clear and moist.  Eyes: Conjunctivae are normal.  Neck: Normal range of motion. Neck supple. No spinous process tenderness and no muscular tenderness present.  Cardiovascular: Normal rate, regular rhythm and normal heart sounds.   Pulmonary/Chest: Effort normal and breath sounds normal. No respiratory distress.  Genitourinary:  Normal rectal tone.  Musculoskeletal: She exhibits no edema.  Tenderness to lumbar paraspinal muscles and cervical paraspinal muscles. No spinous process tenderness. ROM  limited due to pain.  Neurological: She is alert and oriented to person, place, and time. No sensory deficit.  Reflex Scores:      Patellar reflexes are 2+ on the right side and 2+ on the left side.      Achilles reflexes are 2+ on the right side and 2+ on the left side. Strength lower extremities 2/5 and equal BL. UE strength 5/5 and equal BL. Sensation intact. Gait not assessed on initial exam.  Skin: Skin is warm and dry. No rash noted. She is not diaphoretic.  Psychiatric: Her behavior is normal. Her mood appears anxious.  Nursing note and vitals reviewed.   ED Course  Procedures (including critical care time) Labs Review Labs Reviewed  BASIC METABOLIC PANEL - Abnormal; Notable for the following:    Potassium 3.4 (*)    Glucose, Bld 102 (*)    Calcium 8.3 (*)    All other components within normal limits  CBC  URINALYSIS, ROUTINE W REFLEX MICROSCOPIC (NOT AT [facility])  PREGNANCY, URINE    Imaging Review No results found.   EKG Interpretation None      MDM   Final diagnoses:  Chronic back pain  Chronic neck pain   Nontoxic appearing, NAD. AF VSS. Acute on chronic back and neck pain after doing housework. No spinous process tenderness. On initial exam, patient unable to ambulate or have full strength of her lower extremities. After initial exam, patient stating what medications normally help her, including Dilaudid, Ativan and Phenergan. She has multiple medication allergies including NSAIDs and morphine. Has a contract with pain management and cannot receive any prescriptions. Patient given Dilaudid, Ativan and Phenergan with great relief of her nausea and some relief of her pain, however has attempted to ambulate. Discussed with patient obtaining labs and urinalysis, and if pain cannot be controlled, she will need to be admitted for an MRI. Patient states she does not want to be admitted to the hospital, and will try to ambulate. She was given a dose of Decadron as she has  been given Steri-Strip in the past with some relief, along with another dose of Dilaudid with even more relief of her pain. She was then able to ambulate to the restroom. On repeat exam, strength lower extremities 5/5 and equal bilateral. Normal rectal tone. No signs or symptoms of central cord compression or cauda equina. Patient very anxious throughout this entire encounter and continues to state "there is no cure for fibromyalgia". I advised the patient to follow-up with her PCP and back specialist. Rest, ice/heat, and take the pain medication she has at home. Stable for discharge. Return precautions given. Patient states understanding of treatment care plan and is agreeable.  Discussed with attending Dr. Darl Householder who agrees with plan of care.  Carman Ching, PA-C 05/14/15 2129  Wandra Arthurs, MD 05/14/15 [phone]

## 2015-05-22 ENCOUNTER — Encounter: Payer: Self-pay | Admitting: Internal Medicine

## 2015-05-22 ENCOUNTER — Ambulatory Visit (INDEPENDENT_AMBULATORY_CARE_PROVIDER_SITE_OTHER): Payer: BLUE CROSS/BLUE SHIELD | Admitting: Internal Medicine

## 2015-05-22 VITALS — BP 108/64 | HR 92 | Temp 98.6°F | Resp 16 | Ht 67.0 in | Wt 134.0 lb

## 2015-05-22 DIAGNOSIS — M5430 Sciatica, unspecified side: Secondary | ICD-10-CM

## 2015-05-22 DIAGNOSIS — F418 Other specified anxiety disorders: Secondary | ICD-10-CM

## 2015-05-22 MED ORDER — ESCITALOPRAM OXALATE 10 MG PO TABS: 10.0000 mg | ORAL_TABLET | Freq: Every day | ORAL | Status: DC

## 2015-05-22 MED ORDER — PREDNISONE 20 MG PO TABS: ORAL_TABLET | ORAL | Status: DC

## 2015-05-22 NOTE — Progress Notes (Signed)
Subjective:  Patient ID: Gina [name], female    DOB: 10-26-1976  Age: 39 y.o. MRN: [id number]  CC: Depression and Back Pain   New to me  HPI Gina [name] presents for treatment of depression and low back pain. She has a history of fibromyalgia,, chronic pain and sciatica. She is going through a divorce and has been cleaning her house and feels like she has exacerbated her low back pain. She has diffuse aching over her lower back and both posterior hips. The pain does not radiate into her lower extremities and she denies any numbness, weakness, tingling in her legs. When this has happened before she has taken a course of steroids and it is improved. She is requesting steroids at this time. She is also concerned about depression. She was previously treated with Lexapro but stopped taking it 3 weeks ago. She feels like with all the stress that her signs and symptoms of depression are returning.  Outpatient Prescriptions Prior to Visit  Medication Sig Dispense Refill  . acetaminophen (TYLENOL) 325 MG tablet Take 325 mg by mouth every 4 (four) hours as needed for pain.     Marland Kitchen ALPRAZolam (XANAX) 1 MG tablet Take 1 mg by mouth 4 (four) times daily as needed for anxiety.    . methocarbamol (ROBAXIN) 500 MG tablet Take 500 mg by mouth 3 (three) times daily as needed for muscle spasms.     . Norethin Ace-Eth Estrad-FE (MINASTRIN 24 FE PO) Take by mouth. Take one by mouth daily.    Marland Kitchen oxyCODONE-acetaminophen (PERCOCET) 10-325 MG per tablet Take 1 tablet by mouth every 6 (six) hours as needed for pain.    . polyethylene glycol (MIRALAX / GLYCOLAX) packet Take 17 g by mouth daily. 14 each 0  . escitalopram (LEXAPRO) 20 MG tablet Take 10 mg by mouth daily.     Marland Kitchen lidocaine (XYLOCAINE) 5 % ointment Apply 1 application topically as needed. (Patient not taking: Reported on 05/22/2015) 35.44 g 0  . predniSONE (DELTASONE) 20 MG tablet 3 Tabs PO Days 1-3, then 2 tabs PO Days 4-6, then 1 tab PO Day 7-9, then Half  Tab PO Day 10-12 20 tablet 0  . zolpidem (AMBIEN) 5 MG tablet Take 5 mg by mouth at bedtime as needed for sleep.     No facility-administered medications prior to visit.    ROS Review of Systems  Constitutional: Negative.  Negative for fever, chills, diaphoresis, activity change, appetite change, fatigue and unexpected weight change.  HENT: Negative.   Eyes: Negative.   Respiratory: Negative.  Negative for cough.   Cardiovascular: Negative.  Negative for chest pain, palpitations and leg swelling.  Gastrointestinal: Negative.  Negative for abdominal pain.  Endocrine: Negative.   Genitourinary: Negative.   Musculoskeletal: Positive for myalgias, back pain, arthralgias and neck pain. Negative for joint swelling, gait problem and neck stiffness.  Skin: Negative.   Allergic/Immunologic: Negative.   Neurological: Negative.  Negative for dizziness, weakness and numbness.  Hematological: Negative.   Psychiatric/Behavioral: Positive for dysphoric mood (she complains of sadness and crying spells.). Negative for suicidal ideas, behavioral problems, confusion and agitation. The patient is not nervous/anxious.     Objective:  BP 108/64 mmHg  Pulse 92  Temp(Src) 98.6 F (37 C) (Oral)  Resp 16  Ht 5\' 7"  (1.702 m)  Wt 134 lb (60.782 kg)  BMI 20.98 kg/m2  SpO2 97%  BP Readings from Last 3 Encounters:  05/22/15 108/64  05/14/15 95/77  03/12/15 94/60  Wt Readings from Last 3 Encounters:  05/22/15 134 lb (60.782 kg)  05/14/15 140 lb (63.504 kg)  02/02/15 152 lb (68.947 kg)    Physical Exam  Constitutional: She is oriented to person, place, and time. No distress.  HENT:  Mouth/Throat: Oropharynx is clear and moist. No oropharyngeal exudate.  Eyes: Conjunctivae are normal. Right eye exhibits no discharge. Left eye exhibits no discharge. No scleral icterus.  Neck: Normal range of motion. Neck supple. No JVD present. No tracheal deviation present. No thyromegaly present.    Cardiovascular: Normal rate, regular rhythm, normal heart sounds and intact distal pulses.  Exam reveals no gallop and no friction rub.   No murmur heard. Pulmonary/Chest: Effort normal and breath sounds normal. No stridor. No respiratory distress. She has no wheezes. She has no rales. She exhibits no tenderness.  Abdominal: Soft. Bowel sounds are normal. She exhibits no distension and no mass. There is no tenderness. There is no rebound and no guarding.  Musculoskeletal: Normal range of motion. She exhibits no edema or tenderness.  Lymphadenopathy:    She has no cervical adenopathy.  Neurological: She is alert and oriented to person, place, and time. She has normal reflexes. She displays normal reflexes. No cranial nerve deficit. She exhibits normal muscle tone. Coordination normal.  Neg SLR in BLE  Skin: Skin is warm and dry. No rash noted. She is not diaphoretic. No erythema. No pallor.  Psychiatric: Her behavior is normal. Judgment and thought content normal. Her mood appears not anxious. Her affect is not angry, not blunt, not labile and not inappropriate. Her speech is not rapid and/or pressured and not delayed. She is not agitated and not slowed. Cognition and memory are normal. She exhibits a depressed mood. She expresses no homicidal and no suicidal ideation. She expresses no suicidal plans and no homicidal plans.  She is tearful  Vitals reviewed.   Lab Results  Component Value Date   WBC 6.7 05/14/2015   HGB 13.6 05/14/2015   HCT 40.9 05/14/2015   PLT 180 05/14/2015   GLUCOSE 102* 05/14/2015   CHOL 131 02/02/2007   TRIG 59 02/02/2007   HDL 38.9* 02/02/2007   LDLCALC 80 02/02/2007   ALT 14 11/15/2014   AST 19 11/15/2014   NA 140 05/14/2015   K 3.4* 05/14/2015   CL 108 05/14/2015   CREATININE 0.54 05/14/2015   BUN 10 05/14/2015   CO2 27 05/14/2015   TSH 0.85 11/26/2010    No results found.  Assessment & Plan:   Gina [name] was seen today for depression and back  pain.  Diagnoses and all orders for this visit:  Depression with anxiety- will restart Lexapro, she tells me she sees her psychiatrist in the next 2-3 weeks. Orders: -     escitalopram (LEXAPRO) 10 MG tablet; Take 1 tablet (10 mg total) by mouth at bedtime.  Sciatica, unspecified laterality- will try a course of steroids, she will continue her other medications for pain Orders: -     predniSONE (DELTASONE) 20 MG tablet; 3 Tabs PO Days 1-3, then 2 tabs PO Days 4-6, then 1 tab PO Day 7-9, then Half Tab PO Day 10-12   I have discontinued Gina [name] escitalopram, zolpidem, and lidocaine. I am also having her start on escitalopram. Additionally, I am having her maintain her acetaminophen, methocarbamol, ALPRAZolam, oxyCODONE-acetaminophen, Norethin Ace-Eth Estrad-FE (MINASTRIN 24 FE PO), polyethylene glycol, Oxycodone HCl, and predniSONE.  Meds ordered this encounter  Medications  . Oxycodone HCl 10 MG TABS  Sig: take 1 tablet by mouth four times a day if needed for severe pain    Refill:  0  . escitalopram (LEXAPRO) 10 MG tablet    Sig: Take 1 tablet (10 mg total) by mouth at bedtime.    Dispense:  30 tablet    Refill:  2  . predniSONE (DELTASONE) 20 MG tablet    Sig: 3 Tabs PO Days 1-3, then 2 tabs PO Days 4-6, then 1 tab PO Day 7-9, then Half Tab PO Day 10-12    Dispense:  20 tablet    Refill:  0     Follow-up: Return in about 6 weeks (around 07/03/2015).  Scarlette Calico, MD

## 2015-05-22 NOTE — Patient Instructions (Signed)

## 2015-05-28 ENCOUNTER — Ambulatory Visit (INDEPENDENT_AMBULATORY_CARE_PROVIDER_SITE_OTHER): Payer: BLUE CROSS/BLUE SHIELD | Admitting: Internal Medicine

## 2015-05-28 ENCOUNTER — Other Ambulatory Visit: Payer: BLUE CROSS/BLUE SHIELD

## 2015-05-28 ENCOUNTER — Encounter: Payer: Self-pay | Admitting: Internal Medicine

## 2015-05-28 VITALS — BP 118/72 | HR 89 | Temp 98.9°F | Resp 16 | Wt 140.0 lb

## 2015-05-28 DIAGNOSIS — Z0289 Encounter for other administrative examinations: Secondary | ICD-10-CM

## 2015-05-28 DIAGNOSIS — Z0000 Encounter for general adult medical examination without abnormal findings: Secondary | ICD-10-CM | POA: Diagnosis not present

## 2015-05-28 DIAGNOSIS — G894 Chronic pain syndrome: Secondary | ICD-10-CM | POA: Diagnosis not present

## 2015-05-28 DIAGNOSIS — Z111 Encounter for screening for respiratory tuberculosis: Secondary | ICD-10-CM | POA: Diagnosis not present

## 2015-05-28 DIAGNOSIS — Z0184 Encounter for antibody response examination: Secondary | ICD-10-CM | POA: Diagnosis not present

## 2015-05-28 DIAGNOSIS — Z020 Encounter for examination for admission to educational institution: Secondary | ICD-10-CM

## 2015-05-28 MED ORDER — FLUCONAZOLE 150 MG PO TABS: 150.0000 mg | ORAL_TABLET | Freq: Once | ORAL | Status: DC

## 2015-05-28 NOTE — Patient Instructions (Addendum)
  Your next office appointment will be determined based upon review of your pending labs .  Those written interpretation of the lab results and instructions will be transmitted to you by My Chart OR by mail for your records.  Please return Friday 05/30/15 to relieve the TB skin test. Completed forms can be picked up at that time as immunization titers should be available.

## 2015-05-28 NOTE — Progress Notes (Signed)
Pre visit review using our clinic review tool, if applicable. No additional management support is needed unless otherwise documented below in the visit note. 

## 2015-05-28 NOTE — Progress Notes (Signed)
   Subjective:    Patient ID: Gina [name], female    DOB: 1976-05-05, 39 y.o.   MRN: [id number]  HPI She is here to complete a physical required by graduate  school. She plans to enter a program for massage therapy.  Unfortunately she does not have records of her immunizations. The form deadline will require titers be drawn to document immunity.  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  She is followed for chronic pain by Dr.Feraru, [facility]. She sees Dr.Kaur, Psychiatry for depression/anxiety.  Past history includes dysthymic disorder; rheumatoid arthritis; and fibromyalgia.    Review of Systems Her chronic pain symptoms are stable; she does not believe they would interfere with her ability to participate in this program.  Also mental health issues are felt to be stable.  She is on active therapy for both issues.  Her gynecologic care is up-to-date as she saw her Gynecologist, Dr. Nori Riis several months ago. No active issues were noted.     Objective:   Physical Exam   General appearance :adequately nourished; in no distress.  Eyes: No conjunctival inflammation or scleral icterus is present.  Oral exam:  Lips and gums are healthy appearing.There is no oropharyngeal erythema or exudate noted. Dental hygiene is good.  Heart:  Normal rate and regular rhythm. S1 and S2 normal without gallop, murmur, click, rub or other extra sounds    Lungs:Chest clear to auscultation; no wheezes, rhonchi,rales ,or rubs present.No increased work of breathing.   Abdomen: bowel sounds normal, soft and non-tender without masses, organomegaly or hernias noted.  No guarding or rebound.   Vascular : all pulses equal ; no bruits present.  Skin:Warm & dry.  Intact without suspicious lesions or rashes ; no tenting or jaundice   Lymphatic: No lymphadenopathy is noted about the head, neck, axilla   Neurologic  exam : Cn 2-7 intact Strength equal & normal in upper & lower extremities Able to walk on heels and toes.   Balance normal  Romberg normal, finger to nose normal. Deep tendon reflexes are normal. Straight leg raising is negative.             Assessment & Plan:   #1 school exam; immunization titers pending. Tuberculin skin test applied. This will need to be read 05/30/15.  Statements may  be required from Drs. Toy Care and Contractor by the graduate program.

## 2015-05-29 ENCOUNTER — Telehealth: Payer: Self-pay | Admitting: Emergency Medicine

## 2015-05-29 LAB — VARICELLA ZOSTER ANTIBODY, IGG: Varicella IgG: 1433 Index — ABNORMAL HIGH (ref <135.00)

## 2015-05-29 LAB — HEPATITIS B SURFACE ANTIBODY, QUANTITATIVE: Hepatitis B-Post: 0 m[IU]/mL

## 2015-05-29 LAB — MUMPS ANTIBODY, IGG: Mumps IgG: 193 AU/mL — ABNORMAL HIGH (ref <9.00)

## 2015-05-30 ENCOUNTER — Other Ambulatory Visit: Payer: BLUE CROSS/BLUE SHIELD

## 2015-05-30 ENCOUNTER — Telehealth: Payer: Self-pay | Admitting: Emergency Medicine

## 2015-05-30 ENCOUNTER — Ambulatory Visit (INDEPENDENT_AMBULATORY_CARE_PROVIDER_SITE_OTHER): Payer: BLUE CROSS/BLUE SHIELD | Admitting: Emergency Medicine

## 2015-05-30 ENCOUNTER — Ambulatory Visit: Payer: BLUE CROSS/BLUE SHIELD | Admitting: Internal Medicine

## 2015-05-30 DIAGNOSIS — R7611 Nonspecific reaction to tuberculin skin test without active tuberculosis: Secondary | ICD-10-CM

## 2015-05-30 DIAGNOSIS — Z23 Encounter for immunization: Secondary | ICD-10-CM

## 2015-05-30 LAB — TB SKIN TEST
Induration: 5 mm
TB SKIN TEST: POSITIVE

## 2015-05-30 NOTE — Telephone Encounter (Signed)
LVM for pt to call back. Her Hep B titer came back with no immunity, she will need to schedule a nurse visit today to start vaccines. She is also coming in today to have a TB test read and to pick up paper for school.

## 2015-05-31 LAB — QUANTIFERON TB GOLD ASSAY (BLOOD)
Interferon Gamma Release Assay: NEGATIVE
Mitogen value: >10 IU/mL
QUANTIFERON NIL VALUE: 0.06 [IU]/mL
Quantiferon Tb Ag Minus Nil Value: 0 IU/mL
TB AG VALUE: 0.06 [IU]/mL

## 2015-06-01 ENCOUNTER — Encounter: Payer: Self-pay | Admitting: Internal Medicine

## 2015-06-01 DIAGNOSIS — R7611 Nonspecific reaction to tuberculin skin test without active tuberculosis: Secondary | ICD-10-CM | POA: Insufficient documentation

## 2015-06-02 ENCOUNTER — Telehealth: Payer: Self-pay | Admitting: Emergency Medicine

## 2015-06-02 NOTE — Telephone Encounter (Signed)
LVM for pt to call back for test results.

## 2015-07-02 ENCOUNTER — Encounter (HOSPITAL_BASED_OUTPATIENT_CLINIC_OR_DEPARTMENT_OTHER): Payer: Self-pay | Admitting: *Deleted

## 2015-07-02 ENCOUNTER — Emergency Department (HOSPITAL_BASED_OUTPATIENT_CLINIC_OR_DEPARTMENT_OTHER)
Admission: EM | Admit: 2015-07-02 | Discharge: 2015-07-02 | Discharge status: Against Medical Advice | Payer: BLUE CROSS/BLUE SHIELD | Attending: Emergency Medicine | Admitting: Emergency Medicine

## 2015-07-02 ENCOUNTER — Emergency Department (HOSPITAL_BASED_OUTPATIENT_CLINIC_OR_DEPARTMENT_OTHER): Payer: BLUE CROSS/BLUE SHIELD

## 2015-07-02 DIAGNOSIS — Z720 Tobacco use: Secondary | ICD-10-CM | POA: Diagnosis not present

## 2015-07-02 DIAGNOSIS — Z90710 Acquired absence of both cervix and uterus: Secondary | ICD-10-CM | POA: Insufficient documentation

## 2015-07-02 DIAGNOSIS — Z79899 Other long term (current) drug therapy: Secondary | ICD-10-CM | POA: Diagnosis not present

## 2015-07-02 DIAGNOSIS — G8918 Other acute postprocedural pain: Secondary | ICD-10-CM | POA: Insufficient documentation

## 2015-07-02 DIAGNOSIS — F410 Panic disorder [episodic paroxysmal anxiety] without agoraphobia: Secondary | ICD-10-CM | POA: Insufficient documentation

## 2015-07-02 DIAGNOSIS — Z8709 Personal history of other diseases of the respiratory system: Secondary | ICD-10-CM | POA: Insufficient documentation

## 2015-07-02 DIAGNOSIS — Z8742 Personal history of other diseases of the female genital tract: Secondary | ICD-10-CM | POA: Diagnosis not present

## 2015-07-02 DIAGNOSIS — Z87440 Personal history of urinary (tract) infections: Secondary | ICD-10-CM | POA: Insufficient documentation

## 2015-07-02 DIAGNOSIS — Z8619 Personal history of other infectious and parasitic diseases: Secondary | ICD-10-CM | POA: Diagnosis not present

## 2015-07-02 DIAGNOSIS — R109 Unspecified abdominal pain: Secondary | ICD-10-CM | POA: Diagnosis present

## 2015-07-02 DIAGNOSIS — Z880 Allergy status to penicillin: Secondary | ICD-10-CM | POA: Insufficient documentation

## 2015-07-02 DIAGNOSIS — R110 Nausea: Secondary | ICD-10-CM | POA: Diagnosis not present

## 2015-07-02 DIAGNOSIS — Z8719 Personal history of other diseases of the digestive system: Secondary | ICD-10-CM | POA: Insufficient documentation

## 2015-07-02 DIAGNOSIS — R1084 Generalized abdominal pain: Secondary | ICD-10-CM

## 2015-07-02 DIAGNOSIS — M797 Fibromyalgia: Secondary | ICD-10-CM | POA: Diagnosis not present

## 2015-07-02 DIAGNOSIS — Z872 Personal history of diseases of the skin and subcutaneous tissue: Secondary | ICD-10-CM | POA: Diagnosis not present

## 2015-07-02 LAB — CBC WITH DIFFERENTIAL/PLATELET
Basophils Absolute: 0 10*3/uL (ref 0.0–0.1)
Basophils Relative: 0 % (ref 0–1)
EOS PCT: 7 % — AB (ref 0–5)
Eosinophils Absolute: 0.4 10*3/uL (ref 0.0–0.7)
HCT: 39.6 % (ref 36.0–46.0)
HEMOGLOBIN: 13.4 g/dL (ref 12.0–15.0)
LYMPHS ABS: 2.7 10*3/uL (ref 0.7–4.0)
LYMPHS PCT: 42 % (ref 12–46)
MCH: 32 pg (ref 26.0–34.0)
MCHC: 33.8 g/dL (ref 30.0–36.0)
MCV: 94.5 fL (ref 78.0–100.0)
Monocytes Absolute: 0.5 10*3/uL (ref 0.1–1.0)
Monocytes Relative: 8 % (ref 3–12)
NEUTROS PCT: 43 % (ref 43–77)
Neutro Abs: 2.7 10*3/uL (ref 1.7–7.7)
Platelets: 161 10*3/uL (ref 150–400)
RBC: 4.19 MIL/uL (ref 3.87–5.11)
RDW: 12.1 % (ref 11.5–15.5)
WBC: 6.4 10*3/uL (ref 4.0–10.5)

## 2015-07-02 LAB — COMPREHENSIVE METABOLIC PANEL
ALK PHOS: 64 U/L (ref 38–126)
ALT: 15 U/L (ref 14–54)
AST: 15 U/L (ref 15–41)
Albumin: 3.5 g/dL (ref 3.5–5.0)
Anion gap: 5 (ref 5–15)
BUN: 12 mg/dL (ref 6–20)
CALCIUM: 8.3 mg/dL — AB (ref 8.9–10.3)
CO2: 26 mmol/L (ref 22–32)
CREATININE: 0.51 mg/dL (ref 0.44–1.00)
Chloride: 108 mmol/L (ref 101–111)
Glucose, Bld: 93 mg/dL (ref 65–99)
Potassium: 4 mmol/L (ref 3.5–5.1)
Sodium: 139 mmol/L (ref 135–145)
TOTAL PROTEIN: 5.7 g/dL — AB (ref 6.5–8.1)
Total Bilirubin: 0.3 mg/dL (ref 0.3–1.2)

## 2015-07-02 LAB — URINALYSIS, ROUTINE W REFLEX MICROSCOPIC
BILIRUBIN URINE: NEGATIVE
Glucose, UA: NEGATIVE mg/dL
HGB URINE DIPSTICK: NEGATIVE
KETONES UR: NEGATIVE mg/dL
Leukocytes, UA: NEGATIVE
NITRITE: NEGATIVE
PROTEIN: NEGATIVE mg/dL
Specific Gravity, Urine: 1.008 (ref 1.005–1.030)
Urobilinogen, UA: 0.2 mg/dL (ref 0.0–1.0)
pH: 6.5 (ref 5.0–8.0)

## 2015-07-02 MED ORDER — IOHEXOL 300 MG/ML  SOLN
50.0000 mL | Freq: Once | INTRAMUSCULAR | Status: AC | PRN
  Administered 2015-07-02: 50 mL via ORAL

## 2015-07-02 MED ORDER — HYDROMORPHONE HCL 1 MG/ML IJ SOLN
1.0000 mg | Freq: Once | INTRAMUSCULAR | Status: AC
  Administered 2015-07-02: 1 mg via INTRAVENOUS
  Filled 2015-07-02: qty 1

## 2015-07-02 MED ORDER — IOHEXOL 300 MG/ML  SOLN
75.0000 mL | Freq: Once | INTRAMUSCULAR | Status: AC | PRN
  Administered 2015-07-02: 75 mL via INTRAVENOUS

## 2015-07-02 MED ORDER — IOHEXOL 300 MG/ML  SOLN: 100.0000 mL | Freq: Once | INTRAMUSCULAR | Status: DC | PRN

## 2015-07-02 MED ORDER — SODIUM CHLORIDE 0.9 % IV BOLUS (SEPSIS)
1000.0000 mL | Freq: Once | INTRAVENOUS | Status: AC
  Administered 2015-07-02: 1000 mL via INTRAVENOUS

## 2015-07-02 MED ORDER — PROMETHAZINE HCL 25 MG/ML IJ SOLN
12.5000 mg | Freq: Once | INTRAMUSCULAR | Status: AC
  Administered 2015-07-02: 12.5 mg via INTRAVENOUS
  Filled 2015-07-02: qty 1

## 2015-07-02 NOTE — ED Notes (Addendum)
Called to CT to exchange EMS NSL extension to CT compatible extension. Pt c/o pain and anxiety, re-assured.

## 2015-07-02 NOTE — ED Notes (Signed)
Pt with hx of pain management and frequent visits to the ED for pain related sx, with me personally being her nurse more than once before. Geiple, PA sat down with pt and politely laid out boundaries to her tx for pain in this visit, consisting of one round of Dilaudid and Phenergan with no redosing unless an acute finding is shown on CT scan. Pt is agreeable to this plan while PA is present but when PA left she states to me "You know me and you know one round of Dilaudid will not work, please be my advocate in this." I explained to pt the scope of my practice and my ability to advocate for her as far as safe management of her care, which per my assessment is in tact with Geiple's plan.

## 2015-07-02 NOTE — ED Provider Notes (Signed)
CSN: [id number]     Arrival date & time 07/02/15  1734 History   First MD Initiated Contact with Patient 07/02/15 1751     Chief Complaint  Patient presents with  . Abdominal Pain     (Consider location/radiation/quality/duration/timing/severity/associated sxs/prior Treatment) HPI Comments: Patient with history of chronic pain, multiple abdominal surgeries presents with complaint of lower abdominal pain that became much worse today after having endometrial surgery 6 days ago. Patient takes oxycodone 10 mg tablets at home and has been taking these without relief. Last prescription was filled on 07/01/15 #150 oxycodone 10 mg tablets. She denies nausea or vomiting. No diarrhea. No urinary symptoms. Patient states that she was driving today and also helped lift a safe which he thinks exacerbated her pain. Patient was transported to the hospital by EMS. No other complaints.  Patient is a 39 y.o. female presenting with abdominal pain. The history is provided by the patient.  Abdominal Pain Associated symptoms: nausea   Associated symptoms: no chest pain, no cough, no diarrhea, no dysuria, no fever, no sore throat and no vomiting     Past Medical History  Diagnosis Date  . ABDOMINAL WALL HERNIA 02/27/2010  . ALLERGIC RHINITIS 06/02/2007  . ANOREXIA, CHRONIC 08/07/2008  . ANXIETY 09/15/2010  . BACK PAIN, THORACIC REGION 07/07/2007  . CERVICAL RADICULOPATHY 12/11/2008  . Condyloma acuminatum 04/23/2009  . CONSTIPATION 09/15/2010  . DYSPHAGIA UNSPECIFIED 09/09/2009  . 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 ENDOMETRIOSIS 12/16/2009  . FATIGUE 06/11/2010  . GERD 10/23/2009  . HERPES SIMPLEX INFECTION 06/11/2010  . LENTIGO 04/23/2009  . LUNG NODULE 06/17/2010  . Palpitations 10/23/2009  . Stricture and stenosis of esophagus 05/13/2010  . TOBACCO ABUSE 08/07/2008  . Heart palpitations   . Urinary tract infection   . Rheumatoid arthritis(714.0)   . Ovarian cyst   . Abnormal Pap smear   . Fibromyalgia   .  Narcotic abuse, continuous   . Panic disorder    Past Surgical History  Procedure Laterality Date  . Partial hysterectomy    . Endometrial ablation    . Hernia repair      X3  . Dilation and curettage of uterus      x1   . Cesarean section      x2   . Esophageal dilatation x 4    . Gum surgery    . Abdominal surgery    . Abdominal wall mesh  removal     Family History  Problem Relation Age of Onset  . Depression Mother   . Arthritis Mother   . Hyperlipidemia Father   . Hypertension Father    Social History  Substance Use Topics  . Smoking status: Current Every Day Smoker -- 1.00 packs/day    Types: Cigarettes  . Smokeless tobacco: Never Used  . Alcohol Use: No     Comment: has odor of etoh on breath   OB History    Gravida Para Term Preterm AB TAB SAB Ectopic Multiple Living   3 2 2  0 1 0 1 0 0 2     Review of Systems  Constitutional: Negative for fever.  HENT: Negative for rhinorrhea and sore throat.   Eyes: Negative for redness.  Respiratory: Negative for cough.   Cardiovascular: Negative for chest pain.  Gastrointestinal: Positive for nausea and abdominal pain. Negative for vomiting, diarrhea and blood in stool.  Genitourinary: Negative for dysuria.  Musculoskeletal: Negative for myalgias.  Skin: Negative for rash.  Neurological: Negative for headaches.  Allergies  Lamictal; Nicotine; Zofran; Chantix; Codeine; Ibuprofen; Metaxalone; Morphine and related; Ondansetron; Skelaxin; Sulfa antibiotics; Toradol; Amoxicillin; and Penicillins  Home Medications   Prior to Admission medications   Medication Sig Start Date End Date Taking? Authorizing Provider  acetaminophen (TYLENOL) 325 MG tablet Take 325 mg by mouth every 4 (four) hours as needed for pain.     Historical Provider, MD  ALPRAZolam Duanne Moron) 1 MG tablet Take 1 mg by mouth 4 (four) times daily as needed for anxiety.    Historical Provider, MD  escitalopram (LEXAPRO) 10 MG tablet Take 1 tablet (10  mg total) by mouth at bedtime. 05/22/15   Janith Lima, MD  fluconazole (DIFLUCAN) 150 MG tablet Take 1 tablet (150 mg total) by mouth once. 05/28/15   Hendricks Limes, MD  methocarbamol (ROBAXIN) 500 MG tablet Take 500 mg by mouth 3 (three) times daily as needed for muscle spasms.  05/01/13   Fransico Meadow, PA-C  Norethin Ace-Eth Estrad-FE (MINASTRIN 24 FE PO) Take by mouth. Take one by mouth daily.    Historical Provider, MD  Oxycodone HCl 10 MG TABS take 1 tablet by mouth four times a day if needed for severe pain 05/06/15   Historical Provider, MD  predniSONE (DELTASONE) 20 MG tablet 3 Tabs PO Days 1-3, then 2 tabs PO Days 4-6, then 1 tab PO Day 7-9, then Half Tab PO Day 10-12 05/22/15   Janith Lima, MD   BP 98/70 mmHg  Pulse 78  Temp(Src) 98.8 F (37.1 C) (Oral)  Resp 22  SpO2 100%   Physical Exam  Constitutional: She appears well-developed and well-nourished.  HENT:  Head: Normocephalic and atraumatic.  Eyes: Conjunctivae are normal. Right eye exhibits no discharge. Left eye exhibits no discharge.  Neck: Normal range of motion. Neck supple.  Cardiovascular: Normal rate, regular rhythm and normal heart sounds.   Pulmonary/Chest: Effort normal and breath sounds normal.  Abdominal: Soft. She exhibits distension. There is tenderness. There is guarding. There is no rebound.  Patient winces and withdrawals from even light palpation of her abdomen.  Neurological: She is alert.  Skin: Skin is warm and dry.  Psychiatric: Her speech is normal. Judgment normal. Her affect is angry and labile. She is agitated and aggressive. She is not actively hallucinating. Thought content is not paranoid and not delusional.  Nursing note and vitals reviewed.   ED Course  Procedures (including critical care time) Labs Review Labs Reviewed  CBC WITH DIFFERENTIAL/PLATELET - Abnormal; Notable for the following:    Eosinophils Relative 7 (*)    All other components within normal limits  COMPREHENSIVE  METABOLIC PANEL - Abnormal; Notable for the following:    Calcium 8.3 (*)    Total Protein 5.7 (*)    All other components within normal limits  URINALYSIS, ROUTINE W REFLEX MICROSCOPIC (NOT AT [facility])    Imaging Review Ct Abdomen Pelvis W Contrast  07/02/2015   CLINICAL DATA:  Postoperative abdominal pain.  Surgery 1 week ago.  EXAM: CT ABDOMEN AND PELVIS WITH CONTRAST  TECHNIQUE: Multidetector CT imaging of the abdomen and pelvis was performed using the standard protocol following bolus administration of intravenous contrast.  CONTRAST:  22mL OMNIPAQUE IOHEXOL 300 MG/ML SOLN, 75mL OMNIPAQUE IOHEXOL 300 MG/ML SOLN  COMPARISON:  None.  FINDINGS: There is minimal inflammatory stranding and a small amount of subcutaneous gas in the right periumbilical region and this likely represents a laparoscopic port. There is no drainable collection in the area.  There  is a small amount of peritoneal fluid collected under the liver and in the pericolic gutters, also in the dependent pelvic peritoneal recesses. A small portion of this fluid contains high attenuation material, particularly in the left lower quadrant pericolic gutter, and this could represent hemorrhage. There is no extraluminal air in the abdomen or pelvis. There is no bowel obstruction. There is no focal inflammatory change.  There are normal appearances of the liver, gallbladder, bile ducts, pancreas, spleen, adrenals and kidneys. The abdominal aorta is normal in caliber. There is no atherosclerotic calcification. There is no adenopathy in the abdomen or pelvis. There is no significant abnormality in the lower chest. There is no significant skeletal abnormality.  IMPRESSION: 1. Expected postoperative appearances of the periumbilical laparoscopic port site 2. Small amount of peritoneal fluid, some of which may be hemorrhagic as there is a component of higher attenuation material in the left lower quadrant pericolic gutter. 3. No focal inflammatory changes  are evident in the abdomen or pelvis. There is no bowel obstruction or perforation. There is no abscess or contained fluid collection.   Electronically Signed   By: Andreas Newport M.D.   On: 07/02/2015 21:55   I have personally reviewed and evaluated these images and lab results as part of my medical decision-making.   EKG Interpretation None       6:21 PM Patient seen and examined. Work-up initiated. Medications ordered.   Vital signs reviewed and are as follows: BP 98/70 mmHg  Pulse 78  Temp(Src) 98.8 F (37.1 C) (Oral)  Resp 22  SpO2 100%  Patient expressed to me that she can only take Phenergan for her nausea and the only medications that have been found to work for her, through past trial and error, are Dilaudid and Ativan. Patient requests each of these prior to getting her CT scan today.  Given recent surgery and exquisite tenderness on exam, feel that CT of the abdomen and pelvis will be needed to rule out more significant pathology. Patient voices concerns that she has had multiple CT scans in the past, however given her history and exam today feel that this is necessary to rule out emergent pathology such as bleeding.   8:30 PM Called to patient's room. She is refusing CT scan and demanding narcotic pain medication. She also verbalizes that she is upset about not getting ativan earlier. I told the patient that I would give additional phenergan if this would help her get through the CT scan. She refuses this. She is crying and will not let me leave room. I told her that I strongly advise getting the CT given her reported pain in setting of recent surgery. I told her that if she has findings on CT that warrant narcotic medications that I will treat her appropriately. If negative, we will continue her home oral medications.   10:17 PM Patient agreed to stay for CT. Upon returning she continued to be verbally abusive and swearing at staff. Patient demanded that her IV be removed and  to be allowed to leave. This was done. Patient appears to be of sound mind and appears to have capacity to make her own medical decisions. I do not suspect any conditions which would cause her not to have insight into her condition and possible impacts on her health. She is repeatedly demanding narcotic pain medications, phenergan, and benzodiazapine medications and is leaving because she is not getting more of these prior to CT results. Patient allowed to leave AMA.  MDM   Final diagnoses:  Generalized abdominal pain  Acute post-operative pain   Patient with post-operative pain. CT ordered however patient left AMA prior to this resulting. Difficult encounter with patient as described. She has been persistent somewhat hypotensive however her heart rate has been normal. She has a H&H of 13.4/39.6. WBC=6.4. On resulted CT -- patient has small amount of hemorrhage within expected peritoneal fluid which may be exacerbating her pain. I do not suspect large life-threatening hemorrhage.    Carlisle Cater, PA-C 07/02/15 2226  Fredia Sorrow, MD 07/03/15 2035

## 2015-07-02 NOTE — ED Notes (Signed)
Charge RN, Jon in room after pt began yelling at [employer], Therapist, sports using profanities. Pt insists that she did not get any Phenergan that was ordered for her prior to her CT scan. Pt was given Phenergan upon the initiation of her care and the med was not reordered after that. Pt states she is going to leave.

## 2015-07-02 NOTE — ED Notes (Signed)
Pt has called out repeatedly for pain medicine and refused to get up to use the restroom until she was given Dilaudid, which resulted in pt urinating in the bed approx 45 minutes ago. EMT assisted with cleaning her up and pt was able to get up out of bed and move to a chair for this with little to no assistance. Approx 10 minutes ago pt refused to be transported to CT until she was given pain medicine. Geiple, PA entered pt's room and he verbally re-enforced the plan that was initially discussed with her. Pt began crying and speaking to PA in a raised voice, repeatedly asking for pain medicine. Security was called but PA and pt resolved the situation with little intervention. Pt agrees to to CT scan at this time.

## 2015-07-02 NOTE — ED Notes (Addendum)
Pt brought in by EMS from home for lower abd pain , Endometrial surgery x 6 days ago , pt states taking oxycodone without relief, received script for didaudid but states she didn't get it filled

## 2015-07-02 NOTE — ED Notes (Signed)
Patient's daughter at bedside.

## 2015-07-02 NOTE — ED Notes (Signed)
Patient's daughter requesting work note for herself, RN made aware.

## 2015-07-02 NOTE — ED Notes (Signed)
Called to room by pt, requesting IV out and coffee. Pt alert, NAD, calm, interactive, resps e/u, speaking in clear complete sentences, no dyspnea noted, ambulatory in room with steady gait. Declined VS.  IV removed per pt request. Coffee given with refill per pt request. Pt choosing to end visit and leave AMA, husband on the way to pick her up. Requesting work note for daughter who was here earlier (briefly), given.  Pt offered paper scrubs and w/c, agreeable and accepted. Given scrubs and socks. Belongings with pt confirmed. My chart explained. Pt declined to sign AMA. Out in w/c with RN to d/c desk and outside bench per request. Pt verbalizes she has her own pain medicine.

## 2015-07-02 NOTE — ED Notes (Signed)
Patient transported to CT 

## 2015-07-10 ENCOUNTER — Ambulatory Visit (HOSPITAL_COMMUNITY): Payer: BLUE CROSS/BLUE SHIELD | Admitting: Licensed Clinical Social Worker

## 2015-07-15 ENCOUNTER — Other Ambulatory Visit (HOSPITAL_COMMUNITY): Payer: BLUE CROSS/BLUE SHIELD | Attending: Psychiatry | Admitting: Psychiatry

## 2015-07-15 ENCOUNTER — Encounter (HOSPITAL_COMMUNITY): Payer: Self-pay | Admitting: Psychiatry

## 2015-07-15 DIAGNOSIS — F331 Major depressive disorder, recurrent, moderate: Secondary | ICD-10-CM | POA: Insufficient documentation

## 2015-07-15 DIAGNOSIS — M069 Rheumatoid arthritis, unspecified: Secondary | ICD-10-CM | POA: Diagnosis not present

## 2015-07-15 DIAGNOSIS — M797 Fibromyalgia: Secondary | ICD-10-CM | POA: Diagnosis not present

## 2015-07-15 DIAGNOSIS — G894 Chronic pain syndrome: Secondary | ICD-10-CM | POA: Diagnosis not present

## 2015-07-15 DIAGNOSIS — F4001 Agoraphobia with panic disorder: Secondary | ICD-10-CM | POA: Insufficient documentation

## 2015-07-15 DIAGNOSIS — F3162 Bipolar disorder, current episode mixed, moderate: Secondary | ICD-10-CM | POA: Insufficient documentation

## 2015-07-15 DIAGNOSIS — K219 Gastro-esophageal reflux disease without esophagitis: Secondary | ICD-10-CM | POA: Insufficient documentation

## 2015-07-15 DIAGNOSIS — F17210 Nicotine dependence, cigarettes, uncomplicated: Secondary | ICD-10-CM | POA: Insufficient documentation

## 2015-07-15 NOTE — Progress Notes (Signed)
    Daily Group Progress Note  Program: IOP  Group Time: 9:00-10:30  Participation Level: Active  Behavioral Response: Appropriate  Type of Therapy:  Group Therapy  Summary of Progress: Pt. Met with psychiatrist and case manager.      Group Time: 10:30-12:00  Participation Level:  Active  Behavioral Response: Appropriate  Type of Therapy: Psycho-education Group  Summary of Progress: Pt. Participated in group facilitated by Doreene Adas about development of self-efficacy, internal/external locus of control, and reframing our attitudes towards success and failure.   [facility]

## 2015-07-15 NOTE — Addendum Note (Signed)
Addended by: Sherlyn Hay on: 07/15/2015 04:20 PM   Modules accepted: Orders, Medications
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
## 2015-07-16 ENCOUNTER — Other Ambulatory Visit (HOSPITAL_COMMUNITY): Payer: BLUE CROSS/BLUE SHIELD | Attending: Psychiatry | Admitting: Psychiatry

## 2015-07-16 DIAGNOSIS — F331 Major depressive disorder, recurrent, moderate: Secondary | ICD-10-CM | POA: Diagnosis present

## 2015-07-16 NOTE — Progress Notes (Signed)
07/16/15 Encounter: 9:15 AM - 12:00 PM  Purpose: Addressed recovery through a holistic, multifaceted approach. Intervention: Open discussion on aspects of wellness, including diet, sleep hygiene, support systems, & mental reserve. Effect: Gina [name] addressed her current attempt to achieve physical well-being while reassessing her sense of identity.  Doreene Adas CPSS

## 2015-07-17 ENCOUNTER — Other Ambulatory Visit (HOSPITAL_COMMUNITY): Payer: BLUE CROSS/BLUE SHIELD

## 2015-07-17 NOTE — Progress Notes (Signed)
    Daily Group Progress Note  Program: IOP  Group Time: 9:00-10:30  Participation Level: Active  Behavioral Response: Passive-Aggressive and Monopolizing   Type of Therapy:  Group Therapy  Summary of Progress: Pt. Presents as talkative, anxious, restless. Pt. Requested breaks repeatedly during group and had poor tolerance and focus during the group process. Pt. Shared fear related to losing her marriage and financial stability.    Group Time: 10:30-12:00  Participation Level:  Active  Behavioral Response: Appropriate  Type of Therapy: Psycho-education Group  Summary of Progress: Pt. Participated in discussion about grounding sequence and use of breathing, yoga, and bilateral tapping/stimulation to assist with management of anxiety.   Nancie Neas, LPC

## 2015-07-18 ENCOUNTER — Other Ambulatory Visit (HOSPITAL_COMMUNITY): Payer: BLUE CROSS/BLUE SHIELD | Admitting: Psychiatry

## 2015-07-18 DIAGNOSIS — F331 Major depressive disorder, recurrent, moderate: Secondary | ICD-10-CM

## 2015-07-18 NOTE — Progress Notes (Signed)
    Daily Group Progress Note  Program: IOP  Group Time: 9:00-10:30  Participation Level: Active  Behavioral Response: Appropriate  Type of Therapy:  Group Therapy  Summary of Progress: Pt. Presented as anxious and depressed. Pt. Expressed frustration with 37 year old daughter and husband who have not been supportive during recent crisis. Pt. Discussed pain of long-term mental illness, loss of self, remorse for lost time during her mania.      Group Time: 10:30-12:00  Participation Level:  Active  Behavioral Response: Appropriate  Type of Therapy: Psycho-education Group  Summary of Progress: Pt. Participated in grief and loss group facilitated by Jeanella Craze.  Doreene Adas, NT

## 2015-07-18 NOTE — Progress Notes (Signed)
07/18/15 Encounter: 9 AM - 10:30 AM  Purpose: To address role of identity in recovery. Intervention: Open discussion on how we view & define ourselves can facilitate recovery. Effect: Billye expressed the desire to be perceived as separate from her diagnosis.  Doreene Adas CPSS

## 2015-07-21 ENCOUNTER — Other Ambulatory Visit (HOSPITAL_COMMUNITY): Payer: BLUE CROSS/BLUE SHIELD | Admitting: Psychiatry

## 2015-07-21 DIAGNOSIS — F331 Major depressive disorder, recurrent, moderate: Secondary | ICD-10-CM | POA: Diagnosis not present

## 2015-07-21 NOTE — Progress Notes (Signed)
07/21/15 Encounter: 10:15 AM - 12:00 PM  Purpose: To address how relationships with self & others impact recovery. Intervention: Open discussion on sense of selfhood relating to interpersonal interactions. Effect: Gina [name] communicated how she feels that her loyalty in relationships could be met with understanding of her selfhood beyond her diagnosis.  Doreene Adas CPSS

## 2015-07-21 NOTE — Progress Notes (Signed)
    Daily Group Progress Note  Program: IOP  Group Time: 9:00-10:30  Participation Level: Active  Behavioral Response: Agitated  Type of Therapy:  Psycho-education Group  Summary of Progress: Pt. Attended medication education group with [facility]. Pt. Disagreed with parts of Elena's discussion and became agitated and angry. Pt. Reported after group that she was able to exercise some self control by not saying more and/or walking out of group.     Group Time: 10:30-12:00  Participation Level:  Active  Behavioral Response: Sharing  Type of Therapy: Group Therapy  Summary of Progress: Pt. Presented as talkative, dysphoric. Pt. Expressed her discontent with the first part of group due to differences of opinion with the pharmacist. Pt. Shared ongoing stress of marital problems, worries about husband's desire to divorce, and fears about custody of her children. Pt. 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 Pt. Watched and discussed Shawn Achor video about changing perspective on challenges from threat to potential challenge and using tools such as exercise, gratitude, journaling, random acts of kindness and meditation to increase feelings of joy.  Nancie Neas, LPC

## 2015-07-22 ENCOUNTER — Other Ambulatory Visit (HOSPITAL_COMMUNITY): Payer: BLUE CROSS/BLUE SHIELD | Admitting: Psychiatry

## 2015-07-22 DIAGNOSIS — F331 Major depressive disorder, recurrent, moderate: Secondary | ICD-10-CM | POA: Diagnosis not present

## 2015-07-22 NOTE — Progress Notes (Signed)
07/22/15 Encounter: 9 AM - 11:30 AM  Purpose: Addressing coping skills when facing extreme emotions. Intervention: Open discussion on using de-escalation tools to remedy "black & white thinking". Effect: Lotoya concluded that revisiting positive past experiences is effective in increasing her overall well-being in recovery.  Doreene Adas CPSS

## 2015-07-22 NOTE — Progress Notes (Signed)
    Daily Group Progress Note  Program: IOP  Group Time: 9:00-10:30  Participation Level: Active  Behavioral Response: Appropriate  Type of Therapy:  Group Therapy  Summary of Progress: Pt. Presented as talkative, lethargic, reported difficulty sleeping due to nightmares. Pt. Discussed the challenge of managing her emotions, discussed the depth of her pain and fears of allowing herself to feel her painful emotions.     Group Time: 10:30-12:00  Participation Level:  Active  Behavioral Response: Appropriate  Type of Therapy: Psycho-education Group  Summary of Progress: Pt. Had to leave group early due to previously scheduled appointment.  Nancie Neas, LPC

## 2015-07-23 ENCOUNTER — Telehealth (HOSPITAL_COMMUNITY): Payer: Self-pay | Admitting: Psychiatry

## 2015-07-23 ENCOUNTER — Other Ambulatory Visit (HOSPITAL_COMMUNITY): Payer: BLUE CROSS/BLUE SHIELD | Admitting: Psychiatry

## 2015-07-23 NOTE — Telephone Encounter (Signed)
D:  Pt phoned this morning stating that she wouldn't be attending [facility] due to overwhelming anxiety.  "I am so paralyzed by the anxiety that I can't even go outside to get into my car."  Writer inquired if pt had taken her medicine this morning and asked what does pt normally do when feeling this way?  Pt stated that she has never experienced the anxiety this bad.  I inquired if anyone was at the home with pt; and she stated that her daughter was in bed.  Pt denied SI.  A:  Encouraged pt to call a friend, helpline number at [facility], come to [facility] for assessment or go to her nearest ED for assistance.  Also, instructed pt to call 911, if she started having SI.  Informed Dr. Lovena Le.  R:  Pt receptive.

## 2015-07-24 ENCOUNTER — Other Ambulatory Visit (HOSPITAL_COMMUNITY): Payer: BLUE CROSS/BLUE SHIELD | Admitting: Psychiatry

## 2015-07-24 DIAGNOSIS — F331 Major depressive disorder, recurrent, moderate: Secondary | ICD-10-CM | POA: Diagnosis not present

## 2015-07-24 NOTE — Progress Notes (Signed)
07/24/15 Encounter: 9 AM - 12 PM  Purpose: Facing extreme emotion through practicing assertiveness. Intervention: Open guided discussion on healthy self-expression techniques. Effect: Maelynn shared her practice of assertiveness as being as much for the individual self as others.  Doreene Adas CPSS

## 2015-07-25 ENCOUNTER — Other Ambulatory Visit (HOSPITAL_COMMUNITY): Payer: BLUE CROSS/BLUE SHIELD

## 2015-07-25 NOTE — Progress Notes (Signed)
    Daily Group Progress Note  Program: IOP  Group Time: 9:00-10:30  Participation Level: Active  Behavioral Response: Appropriate  Type of Therapy:  Group Therapy  Summary of Progress: Pt. Presents as talkative, somewhat lethargic. Pt. Discussed panic from the previous day that was triggered by severe nightmares. Pt. Does not go into detail about the nature or themes of the nightmares. Pt. Discussed fears related to the end of her marriage, her husband pulling away, discomfort of loss of intimacy with him and at times trying to hard to force the relationship.      Group Time: 10:30-12:00  Participation Level:  Active  Behavioral Response: Appropriate  Type of Therapy: Psycho-education Group  Summary of Progress: Pt. Participated in the "I AM" poem activity to develop development of self.  Nancie Neas, LPC

## 2015-07-28 ENCOUNTER — Other Ambulatory Visit (HOSPITAL_COMMUNITY): Payer: BLUE CROSS/BLUE SHIELD

## 2015-07-29 ENCOUNTER — Other Ambulatory Visit (HOSPITAL_COMMUNITY): Payer: BLUE CROSS/BLUE SHIELD | Admitting: Psychiatry

## 2015-07-29 DIAGNOSIS — F331 Major depressive disorder, recurrent, moderate: Secondary | ICD-10-CM

## 2015-07-29 NOTE — Progress Notes (Signed)
07/29/15 Encounter: 9 AM - 12 PM  Purpose: To address methods of anger management in recovery. Intervention: Open guided discussion on peer-based anger management strategies. Effect: Gina [name] shared how she has discovered that mania & anger can be addressed by utilizing a positive outlet.  Doreene Adas CPSS

## 2015-07-30 ENCOUNTER — Other Ambulatory Visit (HOSPITAL_COMMUNITY): Payer: BLUE CROSS/BLUE SHIELD | Admitting: Psychiatry

## 2015-07-30 DIAGNOSIS — F331 Major depressive disorder, recurrent, moderate: Secondary | ICD-10-CM | POA: Diagnosis not present

## 2015-07-30 NOTE — Progress Notes (Signed)
07/30/15 Encounter: 9 AM - 10:30 AM  Purpose: To address methods of wellness regimens in recovery. Intervention: Open guided discussion on mental & physical wellness. Effect: Riyanna related the importance of conversational skill-sets in her recovery process.  Doreene Adas CPSS

## 2015-07-30 NOTE — Progress Notes (Signed)
    Daily Group Progress Note  Program: IOP  Group Time:  9:00-10:30  Participation Level: Active  Behavioral Response: Appropriate  Type of Therapy:  Group Therapy  Summary of Progress: Pt. Presented with low energy and lethargic. Pt. Reported that she complete dula training over the weekend and that it had exhausted her physically and mentally and that her whole body ached as a result of the training. Pt. Was encouraged to develop awareness of her physical and emotional limitations so that she can set better boundaries in the future.      Group Time: 10:30-12:00  Participation Level:  Active  Behavioral Response: Appropriate  Type of Therapy: Psycho-education Group  Summary of Progress: Pt. Participated in anger management group co-facilitated by Doreene Adas. Pt. Discussed ways that her anger overwhelms her, taking physical distance to calm herself, and talking to a friend to calm herself down.    Nancie Neas, LPC

## 2015-07-30 NOTE — Progress Notes (Signed)
    Daily Group Progress Note  Program: IOP  Group Time: 9:00-10:30  Participation Level: Active  Behavioral Response: Appropriate  Type of Therapy:  Group Therapy  Summary of Progress: Pt. Presents as lethargic, at times dysphoric. Pt. Continues to complain of fibromialgia pain. Pt. Connected with themes about learning to prioritize self-care and asking assertively to have personal needs met. Pt. Also connected with themes related to learning self about periods of career and relationship transition.      Group Time: 10:30-12:00  Participation Level:  Active  Behavioral Response: Appropriate  Type of Therapy: Psycho-education Group  Summary of Progress: Pt. Participated in grief and loss group facilitated by Jeanella Craze.   Nancie Neas, LPC

## 2015-07-31 ENCOUNTER — Other Ambulatory Visit (HOSPITAL_COMMUNITY): Payer: BLUE CROSS/BLUE SHIELD

## 2015-08-01 ENCOUNTER — Other Ambulatory Visit (HOSPITAL_COMMUNITY): Payer: BLUE CROSS/BLUE SHIELD | Admitting: Psychiatry

## 2015-08-01 DIAGNOSIS — F331 Major depressive disorder, recurrent, moderate: Secondary | ICD-10-CM

## 2015-08-01 NOTE — Progress Notes (Signed)
    Daily Group Progress Note  Program: IOP  Group Time: 9:00-10:30  Participation Level: Active  Behavioral Response: Appropriate  Type of Therapy:  Group Therapy  Summary of Progress: Pt. Presented as talkative. Pt. Prepared for discharge. Pt. Continued to discuss changes in her marriage and worries about ability to accept life changes related to the sale of her house. Pt discussed "light bulb moment" yesterday when she realized that she would have to begin soothing herself during periods of stress and lessen her dependence on her husband.      Group Time: 10:30-12:00  Participation Level:  Active  Behavioral Response: Appropriate  Type of Therapy: Psycho-education Group  Summary of Progress: Pt. Participated in reflective reading and discussion about acceptance of personal imperfections.   Nancie Neas, LPC
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
## 2015-08-01 NOTE — Patient Instructions (Signed)
Patient requested to be discharged from [facility] today due to insurance purposes.  Will follow up with Dr. Reece Levy and therapist of choice.  Encouraged support groups.

## 2015-08-01 NOTE — Progress Notes (Signed)
Patient ID: Gina [name], female   DOB: 05/04/1976, 39 y.o.   MRN: [id number] Discharge Note  Patient:  Gina [name] is an 39 y.o., female DOB:  1975/11/17  Date of Admission:  (Not on file)  Date of Discharge:  08/01/2015  Reason for Admission:depression  IOP Course:  Attended and participated.  Says it was beneficial to her and she does feel better and could use more help but her insurance will not cover it.  Still anxious and depressed but improved in both areas.  Mental Status at Discharge:no suicidal thoughts   Lab Results: No results found for this or any previous visit (from the past 76 hour(s)).   Current outpatient prescriptions:  .  acetaminophen (TYLENOL) 325 MG tablet, Take 325 mg by mouth every 4 (four) hours as needed for pain. , Disp: , Rfl:  .  ALPRAZolam (XANAX) 1 MG tablet, Take 1 mg by mouth 4 (four) times daily as needed for anxiety., Disp: , Rfl:  .  diazepam (VALIUM) 5 MG tablet, Take 5 mg by mouth every 6 (six) hours as needed for anxiety., Disp: , Rfl:  .  escitalopram (LEXAPRO) 10 MG tablet, Take 1 tablet (10 mg total) by mouth at bedtime. (Patient taking differently: Take 10 mg by mouth at bedtime. Take two tablets.), Disp: 30 tablet, Rfl: 2 .  lurasidone (LATUDA) 80 MG TABS tablet, Take 80 mg by mouth daily with breakfast., Disp: , Rfl:  .  methocarbamol (ROBAXIN) 500 MG tablet, Take 500 mg by mouth 3 (three) times daily as needed for muscle spasms. , Disp: , Rfl:  .  Oxycodone HCl 10 MG TABS, take 1 tablet by mouth four times a day if needed for severe pain, Disp: , Rfl: 0  Axis Diagnosis:  Major depression recurrent moderate   Level of Care:  IOP  Discharge destination:  Other:  follow up with DrReddy and her therapist that she will choose  Is patient on multiple antipsychotic therapies at discharge:  No    Has Patient had three or more failed trials of antipsychotic monotherapy by history:  Negative  Patient phone:  [phone] (home)  Patient  address:   [place] [address],   Follow-up recommendations:  Activity:  continue current activity Diet:  continue current diet  Comments:  none  The patient received suicide prevention pamphlet:  Yes Belongings returned:  na  Donnelly Angelica 08/01/2015, 11:31 AM

## 2015-08-04 ENCOUNTER — Other Ambulatory Visit (HOSPITAL_COMMUNITY): Payer: BLUE CROSS/BLUE SHIELD

## 2015-08-05 ENCOUNTER — Other Ambulatory Visit (HOSPITAL_COMMUNITY): Payer: BLUE CROSS/BLUE SHIELD

## 2016-01-23 ENCOUNTER — Encounter (HOSPITAL_COMMUNITY): Payer: Self-pay | Admitting: Emergency Medicine

## 2016-01-23 ENCOUNTER — Emergency Department (HOSPITAL_COMMUNITY)
Admission: EM | Admit: 2016-01-23 | Discharge: 2016-01-23 | Disposition: A | Discharge status: Home / Self Care | Payer: Medicaid Other | Attending: Emergency Medicine | Admitting: Emergency Medicine

## 2016-01-23 DIAGNOSIS — M069 Rheumatoid arthritis, unspecified: Secondary | ICD-10-CM | POA: Insufficient documentation

## 2016-01-23 DIAGNOSIS — Z87440 Personal history of urinary (tract) infections: Secondary | ICD-10-CM | POA: Diagnosis not present

## 2016-01-23 DIAGNOSIS — Z872 Personal history of diseases of the skin and subcutaneous tissue: Secondary | ICD-10-CM | POA: Insufficient documentation

## 2016-01-23 DIAGNOSIS — Z79899 Other long term (current) drug therapy: Secondary | ICD-10-CM | POA: Insufficient documentation

## 2016-01-23 DIAGNOSIS — Z8619 Personal history of other infectious and parasitic diseases: Secondary | ICD-10-CM | POA: Diagnosis not present

## 2016-01-23 DIAGNOSIS — Z8742 Personal history of other diseases of the female genital tract: Secondary | ICD-10-CM | POA: Insufficient documentation

## 2016-01-23 DIAGNOSIS — M5432 Sciatica, left side: Secondary | ICD-10-CM | POA: Insufficient documentation

## 2016-01-23 DIAGNOSIS — F319 Bipolar disorder, unspecified: Secondary | ICD-10-CM | POA: Diagnosis not present

## 2016-01-23 DIAGNOSIS — F17210 Nicotine dependence, cigarettes, uncomplicated: Secondary | ICD-10-CM | POA: Diagnosis not present

## 2016-01-23 DIAGNOSIS — Z880 Allergy status to penicillin: Secondary | ICD-10-CM | POA: Diagnosis not present

## 2016-01-23 DIAGNOSIS — M5431 Sciatica, right side: Secondary | ICD-10-CM | POA: Diagnosis not present

## 2016-01-23 DIAGNOSIS — Z8719 Personal history of other diseases of the digestive system: Secondary | ICD-10-CM | POA: Insufficient documentation

## 2016-01-23 DIAGNOSIS — F410 Panic disorder [episodic paroxysmal anxiety] without agoraphobia: Secondary | ICD-10-CM | POA: Insufficient documentation

## 2016-01-23 DIAGNOSIS — R110 Nausea: Secondary | ICD-10-CM | POA: Insufficient documentation

## 2016-01-23 DIAGNOSIS — M545 Low back pain: Secondary | ICD-10-CM | POA: Diagnosis present

## 2016-01-23 MED ORDER — DEXTROSE 5 % IV SOLN
1000.0000 mg | Freq: Once | INTRAVENOUS | Status: AC
  Administered 2016-01-23: 1000 mg via INTRAVENOUS
  Filled 2016-01-23: qty 10

## 2016-01-23 MED ORDER — DIAZEPAM 5 MG PO TABS
5.0000 mg | ORAL_TABLET | Freq: Once | ORAL | Status: AC
  Administered 2016-01-23: 5 mg via ORAL
  Filled 2016-01-23: qty 1

## 2016-01-23 MED ORDER — METHOCARBAMOL 1000 MG/10ML IJ SOLN
1000.0000 mg | Freq: Once | INTRAMUSCULAR | Status: DC
  Filled 2016-01-23: qty 10

## 2016-01-23 MED ORDER — LORAZEPAM 2 MG/ML IJ SOLN
1.0000 mg | Freq: Once | INTRAMUSCULAR | Status: AC
  Administered 2016-01-23: 1 mg via INTRAVENOUS
  Filled 2016-01-23: qty 1

## 2016-01-23 MED ORDER — PREDNISONE 20 MG PO TABS: ORAL_TABLET | ORAL | Status: DC

## 2016-01-23 MED ORDER — PROMETHAZINE HCL 25 MG/ML IJ SOLN
25.0000 mg | Freq: Once | INTRAMUSCULAR | Status: AC
  Administered 2016-01-23: 25 mg via INTRAVENOUS
  Filled 2016-01-23: qty 1

## 2016-01-23 MED ORDER — METHYLPREDNISOLONE SODIUM SUCC 125 MG IJ SOLR
125.0000 mg | Freq: Once | INTRAMUSCULAR | Status: AC
  Administered 2016-01-23: 125 mg via INTRAVENOUS
  Filled 2016-01-23: qty 2

## 2016-01-23 NOTE — Discharge Instructions (Signed)
Resume your normal medications  Tonight you were given IV Lorazepam, PO Valium, IV Robaxin and IV steroids  Start the steroid taper tomorrow

## 2016-01-23 NOTE — ED Notes (Signed)
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 Pt reports this pain feels like sciatic pain combined with a pulled back. Reports history of similar pain episodes.

## 2016-01-23 NOTE — ED Provider Notes (Signed)
History  By signing my name below, I, Gina [name], attest that this documentation has been prepared under the direction and in the presence of Gina Joy, PA-C. Electronically Signed: Marlowe [name], ED Scribe. 01/23/2016. 8:30 PM.  Chief Complaint  Patient presents with  . Back Pain  . Leg Pain   The history is provided by the patient and medical records. No language interpreter was used.    HPI Comments:  Gina [name] is a 40 y.o. female with PMHx of fibromyalgia and sciatica who presents to the Emergency Department complaining of worsening lower back pain that began two days ago. She reports associated neck and bilateral shoulder pain she describes as tightness. She reports the low back pain radiates down the left leg and there is a stabbing pain in her left buttocks. She reports associated nausea secondary to pain. 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 Pt states she normally sees a neurologist without successful relief of the ongoing chronic pain. She states she has been taking Robaxin, Aleve and Oxycodone with minimal relief of the pain. Pt reports applying a heating pad to her back without relief of pain. She states she has had Ativan and Dilaudid in the past with a steroid with successful relief of the pain. Movement increases her pain. She denies alleviating factors. She denies fever, chills, loss of bowel or bladder function, numbness, tingling or weakness of any extremity, trauma, injury or fall. She reports allergies to Tramadol, Zofran, Lamictal as well as several other allergies listed in the chart.  Past Medical History  Diagnosis Date  . ABDOMINAL WALL HERNIA 02/27/2010  . ALLERGIC RHINITIS 06/02/2007  . ANOREXIA, CHRONIC 08/07/2008  . ANXIETY 09/15/2010  . BACK PAIN, THORACIC REGION 07/07/2007  . CERVICAL RADICULOPATHY 12/11/2008  . Condyloma acuminatum 04/23/2009  . CONSTIPATION 09/15/2010  . DYSPHAGIA UNSPECIFIED 09/09/2009  . 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 ENDOMETRIOSIS 12/16/2009  . FATIGUE 06/11/2010  . GERD 10/23/2009  . HERPES SIMPLEX INFECTION 06/11/2010  . LENTIGO 04/23/2009  . LUNG NODULE 06/17/2010  . Palpitations 10/23/2009  . Stricture and stenosis of esophagus 05/13/2010  . TOBACCO ABUSE 08/07/2008  . Heart palpitations   . Urinary tract infection   . Rheumatoid arthritis(714.0)   . Ovarian cyst   . Abnormal Pap smear   . Fibromyalgia   . Narcotic abuse, continuous   . Panic disorder   . Bipolar disorder [facility])    Past Surgical History  Procedure Laterality Date  . Partial hysterectomy    . Endometrial ablation    . Hernia repair      X3  . Dilation and curettage of uterus      x1   . Cesarean section      x2   . Esophageal dilatation x 4    . Gum surgery    . Abdominal surgery    . Abdominal wall mesh  removal     Family History  Problem Relation Age of Onset  . Depression Mother   . Arthritis Mother   . Hyperlipidemia Father   . Hypertension Father    Social History  Substance Use Topics  . Smoking status: Current Every Day Smoker -- 1.00 packs/day    Types: Cigarettes  . Smokeless tobacco: Never Used  . Alcohol Use: No     Comment: has odor of etoh on breath   OB History    Gravida Para Term Preterm AB TAB SAB Ectopic Multiple Living   3 2  2 0 1 0 1 0 0 2     Review of Systems  Constitutional: Negative for fever and chills.  Gastrointestinal: Positive for nausea.  Genitourinary:       No bowel or bladder incontinence  Musculoskeletal: Positive for myalgias and back pain.  Skin: Negative for color change and wound.  Neurological: Negative for weakness and numbness.    Allergies  Lamictal; Nicotine; Zofran; Chantix; Codeine; Ibuprofen; Metaxalone; Morphine and related; Ondansetron; Skelaxin; Sulfa antibiotics; Toradol; Amoxicillin; and Penicillins  Home Medications   Prior to Admission medications   Medication Sig Start Date End Date Taking? Authorizing Provider   acetaminophen (TYLENOL) 325 MG tablet Take 325 mg by mouth every 4 (four) hours as needed for pain.     Historical Provider, MD  ALPRAZolam Duanne Moron) 1 MG tablet Take 1 mg by mouth 4 (four) times daily as needed for anxiety.    Historical Provider, MD  diazepam (VALIUM) 5 MG tablet Take 5 mg by mouth every 6 (six) hours as needed for anxiety.    Historical Provider, MD  escitalopram (LEXAPRO) 10 MG tablet Take 1 tablet (10 mg total) by mouth at bedtime. Patient taking differently: Take 10 mg by mouth at bedtime. Take two tablets. 05/22/15   Gina Lima, MD  lurasidone (LATUDA) 80 MG TABS tablet Take 80 mg by mouth daily with breakfast.    Historical Provider, MD  methocarbamol (ROBAXIN) 500 MG tablet Take 500 mg by mouth 3 (three) times daily as needed for muscle spasms.  05/01/13   Gina Meadow, PA-C  Oxycodone HCl 10 MG TABS take 1 tablet by mouth four times a day if needed for severe pain 05/06/15   Historical Provider, MD  predniSONE (DELTASONE) 20 MG tablet 3 Tabs PO Days 1-3, then 2 tabs PO Days 4-6, then 1 tab PO Day 7-9, then Half Tab PO Day 10-12 01/23/16   Gina Creamer, NP   Triage Vitals: BP 128/103 mmHg  Pulse 83  Temp(Src) 97.5 F (36.4 C) (Oral)  Resp 16  SpO2 100% Physical Exam  Constitutional: She is oriented to person, place, and time. She appears well-developed and well-nourished.  HENT:  Head: Normocephalic and atraumatic.  Eyes: EOM are normal.  Neck: Normal range of motion.  Cardiovascular: Normal rate.   Pulmonary/Chest: Effort normal.  Musculoskeletal: She exhibits tenderness.  Tenderness to left lumbar and sacral musculature.  Neurological: She is alert and oriented to person, place, and time.  Skin: Skin is warm and dry.  Psychiatric: She has a normal mood and affect. Her behavior is normal.  Nursing note and vitals reviewed.   ED Course  Procedures (including critical care time) DIAGNOSTIC STUDIES: Oxygen Saturation is 100% on RA, normal by my  interpretation.   COORDINATION OF CARE: 7:13 PM- Will order Phenergan, Robaxin, Solu-Medrol and Ativan. Pt requests all IV medication stating if she takes anything orally she will vomit. Pt verbalizes understanding and agrees to plan.  Medications  promethazine (PHENERGAN) injection 25 mg (25 mg Intravenous Given 01/23/16 2011)  methylPREDNISolone sodium succinate (SOLU-MEDROL) 125 mg/2 mL injection 125 mg (125 mg Intravenous Given 01/23/16 2011)  LORazepam (ATIVAN) injection 1 mg (1 mg Intravenous Given 01/23/16 2011)  methocarbamol (ROBAXIN) 1,000 mg in dextrose 5 % 50 mL IVPB (1,000 mg Intravenous New Bag/Given 01/23/16 2045)  LORazepam (ATIVAN) injection 1 mg (1 mg Intravenous Given 01/23/16 2123)  diazepam (VALIUM) tablet 5 mg (5 mg Oral Given 01/23/16 2123)    Labs Review Labs Reviewed - No  data to display  Imaging Review No results found. I have personally reviewed and evaluated these images and lab results as part of my medical decision-making.   EKG Interpretation None     She was reassessed after receiving IV Robaxin IV Ativan by mouth, Valium and IV Solu-Medrol.  She states that she has significant decrease in her pain and she is ready for home.  She's been instructed to resume her normal medicines.  Follow-up with her pain clinic and to start her steroid taper tomorrow MDM   Final diagnoses:  Bilateral sciatica      8:18 PM End of shift patient care handoff report given to Gina [name]. Plan: Check for pain reduction, reexamine, and discharge with instructions to follow up with her neurologist.   I personally performed the services described in this documentation, which was scribed in my presence. The recorded information has been reviewed and is accurate.    Gina Creamer, NP 01/23/16 2137  Lacretia Leigh, MD 01/23/16 [phone]

## 2016-04-09 ENCOUNTER — Emergency Department (HOSPITAL_COMMUNITY)
Admission: EM | Admit: 2016-04-09 | Discharge: 2016-04-09 | Disposition: A | Discharge status: Home / Self Care | Payer: Medicaid Other | Attending: Emergency Medicine | Admitting: Emergency Medicine

## 2016-04-09 ENCOUNTER — Encounter (HOSPITAL_COMMUNITY): Payer: Self-pay | Admitting: Emergency Medicine

## 2016-04-09 DIAGNOSIS — M549 Dorsalgia, unspecified: Secondary | ICD-10-CM | POA: Insufficient documentation

## 2016-04-09 DIAGNOSIS — M5440 Lumbago with sciatica, unspecified side: Secondary | ICD-10-CM

## 2016-04-09 DIAGNOSIS — F17210 Nicotine dependence, cigarettes, uncomplicated: Secondary | ICD-10-CM | POA: Diagnosis not present

## 2016-04-09 DIAGNOSIS — Z8739 Personal history of other diseases of the musculoskeletal system and connective tissue: Secondary | ICD-10-CM | POA: Insufficient documentation

## 2016-04-09 DIAGNOSIS — M5430 Sciatica, unspecified side: Secondary | ICD-10-CM | POA: Insufficient documentation

## 2016-04-09 DIAGNOSIS — M542 Cervicalgia: Secondary | ICD-10-CM | POA: Insufficient documentation

## 2016-04-09 LAB — CBG MONITORING, ED: GLUCOSE-CAPILLARY: 98 mg/dL (ref 65–99)

## 2016-04-09 MED ORDER — HYDROMORPHONE HCL 1 MG/ML IJ SOLN
1.0000 mg | Freq: Once | INTRAMUSCULAR | Status: AC
  Administered 2016-04-09: 1 mg via INTRAVENOUS
  Filled 2016-04-09: qty 1

## 2016-04-09 MED ORDER — PREDNISONE 10 MG (21) PO TBPK: 10.0000 mg | ORAL_TABLET | Freq: Every day | ORAL | Status: DC

## 2016-04-09 MED ORDER — METHYLPREDNISOLONE SODIUM SUCC 125 MG IJ SOLR
125.0000 mg | Freq: Once | INTRAMUSCULAR | Status: AC
  Administered 2016-04-09: 125 mg via INTRAVENOUS
  Filled 2016-04-09: qty 2

## 2016-04-09 MED ORDER — LORAZEPAM 2 MG/ML IJ SOLN
1.0000 mg | Freq: Once | INTRAMUSCULAR | Status: AC
  Administered 2016-04-09: 1 mg via INTRAVENOUS
  Filled 2016-04-09: qty 1

## 2016-04-09 MED ORDER — PROMETHAZINE HCL 25 MG/ML IJ SOLN
25.0000 mg | Freq: Once | INTRAMUSCULAR | Status: AC
  Administered 2016-04-09: 25 mg via INTRAVENOUS
  Filled 2016-04-09: qty 1

## 2016-04-09 NOTE — Discharge Instructions (Signed)

## 2016-04-09 NOTE — ED Notes (Signed)
MD at bedside. 

## 2016-04-09 NOTE — ED Provider Notes (Signed)
CSN: [id number]     Arrival date & time 04/09/16  2027 History   First MD Initiated Contact with Patient 04/09/16 2039     Chief Complaint  Patient presents with  . Fibromyalgia  . Sciatica  PT PRESENTS VIA EMS WITH NECK AND BACK PAIN.  PT ATTENDED FIBROMYALGIA CLASS AT THE [facility] AND DID WATER AEROBICS.  SHE THINKS SHE MAY HAVE PUSHED HERSELF TOO HARD.  THE PT SAID THAT SHE USUALLY GETS DILAUDID, PHENERGAN, ATIVAN, AND SOLU-MEDROL WHEN SHE GETS THIS BAD.  THE PT WAS GIVEN 250 MCG FENTANYL BY EMS WITHOUT RELIEF.   (Consider location/radiation/quality/duration/timing/severity/associated sxs/prior Treatment) The history is provided by the patient.    Past Medical History  Diagnosis Date  . ABDOMINAL WALL HERNIA 02/27/2010  . ALLERGIC RHINITIS 06/02/2007  . ANOREXIA, CHRONIC 08/07/2008  . ANXIETY 09/15/2010  . BACK PAIN, THORACIC REGION 07/07/2007  . CERVICAL RADICULOPATHY 12/11/2008  . Condyloma acuminatum 04/23/2009  . CONSTIPATION 09/15/2010  . DYSPHAGIA UNSPECIFIED 09/09/2009  . 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 ENDOMETRIOSIS 12/16/2009  . FATIGUE 06/11/2010  . GERD 10/23/2009  . HERPES SIMPLEX INFECTION 06/11/2010  . LENTIGO 04/23/2009  . LUNG NODULE 06/17/2010  . Palpitations 10/23/2009  . Stricture and stenosis of esophagus 05/13/2010  . TOBACCO ABUSE 08/07/2008  . Heart palpitations   . Urinary tract infection   . Rheumatoid arthritis(714.0)   . Ovarian cyst   . Abnormal Pap smear   . Fibromyalgia   . Narcotic abuse, continuous   . Panic disorder   . Bipolar disorder [facility])    Past Surgical History  Procedure Laterality Date  . Partial hysterectomy    . Endometrial ablation    . Hernia repair      X3  . Dilation and curettage of uterus      x1   . Cesarean section      x2   . Esophageal dilatation x 4    . Gum surgery    . Abdominal surgery    . Abdominal wall mesh  removal     Family History  Problem Relation Age of Onset  . Depression Mother   . Arthritis Mother   .  Hyperlipidemia Father   . Hypertension Father    Social History  Substance Use Topics  . Smoking status: Current Every Day Smoker -- 1.00 packs/day    Types: Cigarettes  . Smokeless tobacco: Never Used  . Alcohol Use: No     Comment: has odor of etoh on breath   OB History    Gravida Para Term Preterm AB TAB SAB Ectopic Multiple Living   3 2 2  0 1 0 1 0 0 2     Review of Systems  Musculoskeletal: Positive for myalgias, back pain, arthralgias and neck pain.  All other systems reviewed and are negative.     Allergies  Lamictal; Nicotine; Zofran; Chantix; Codeine; Ibuprofen; Metaxalone; Morphine and related; Ondansetron; Skelaxin; Sulfa antibiotics; Toradol; Amoxicillin; and Penicillins  Home Medications   Prior to Admission medications   Medication Sig Start Date End Date Taking? Authorizing Provider  acetaminophen (TYLENOL) 325 MG tablet Take 325 mg by mouth every 4 (four) hours as needed for pain.    Yes Historical Provider, MD  ALPRAZolam Duanne Moron) 1 MG tablet Take 1 mg by mouth 4 (four) times daily as needed for anxiety.   Yes Historical Provider, MD  escitalopram (LEXAPRO) 20 MG tablet Take 30 mg by mouth at bedtime.   Yes Historical Provider, MD  lurasidone (  LATUDA) 80 MG TABS tablet Take 80 mg by mouth at bedtime.    Yes Historical Provider, MD  methocarbamol (ROBAXIN) 750 MG tablet Take 750 mg by mouth every 8 (eight) hours as needed for muscle spasms.   Yes Historical Provider, MD  Oxycodone HCl 10 MG TABS take 1 tablet by mouth four times a day if needed for severe pain 05/06/15  Yes Historical Provider, MD  ziprasidone (GEODON) 60 MG capsule take 1 capsule by mouth every evening with food 04/06/16  Yes Historical Provider, MD  escitalopram (LEXAPRO) 10 MG tablet Take 1 tablet (10 mg total) by mouth at bedtime. Patient not taking: Reported on 04/09/2016 05/22/15   Janith Lima, MD  predniSONE (DELTASONE) 20 MG tablet 3 Tabs PO Days 1-3, then 2 tabs PO Days 4-6, then 1 tab PO  Day 7-9, then Half Tab PO Day 10-12 Patient not taking: Reported on 04/09/2016 01/23/16   Junius Creamer, NP  predniSONE (STERAPRED UNI-PAK 21 TAB) 10 MG (21) TBPK tablet Take 1 tablet (10 mg total) by mouth daily. Take 6 tabs by mouth daily  for 2 days, then 5 tabs for 2 days, then 4 tabs for 2 days, then 3 tabs for 2 days, 2 tabs for 2 days, then 1 tab by mouth daily for 2 days 04/09/16   Isla Pence, MD   BP 107/69 mmHg  Pulse 66  Temp(Src) 99.3 F (37.4 C) (Oral)  Resp 20  SpO2 99% Physical Exam  Constitutional: She is oriented to person, place, and time. She appears well-developed and well-nourished.  HENT:  Head: Normocephalic and atraumatic.  Right Ear: External ear normal.  Left Ear: External ear normal.  Nose: Nose normal.  Mouth/Throat: Oropharynx is clear and moist.  Eyes: Conjunctivae and EOM are normal. Pupils are equal, round, and reactive to light.  Neck: Normal range of motion. Neck supple.  Cardiovascular: Normal rate, regular rhythm, normal heart sounds and intact distal pulses.   Pulmonary/Chest: Effort normal and breath sounds normal.  Abdominal: Soft. Bowel sounds are normal.  Musculoskeletal: Normal range of motion.  Neurological: She is alert and oriented to person, place, and time.  Skin: Skin is warm and dry.  Psychiatric: She has a normal mood and affect. Her behavior is normal. Judgment and thought content normal.  Nursing note and vitals reviewed.   ED Course  Procedures (including critical care time) Labs Review Labs Reviewed  CBG MONITORING, ED    Imaging Review No results found. I have personally reviewed and evaluated these images and lab results as part of my medical decision-making.   EKG Interpretation None      MDM  PT IS FEELING BETTER.  SHE WILL BE D/C'D WITH A RX FOR PREDNISONE.  SHE KNOWS TO RETURN IF WORSE. Final diagnoses:  Acute back pain with sciatica, unspecified laterality  History of fibromyalgia       Isla Pence,  MD 04/09/16 2240

## 2016-04-09 NOTE — ED Notes (Signed)
Bed: [id number] Expected date:  Expected time:  Means of arrival:  Comments: EMS 41yo F myalgia pain - triage

## 2016-04-09 NOTE — ED Notes (Signed)
Pt BIB from home; pt has hx of muscle spasms, fibromyalgia, and sciatica; pt attended fibromyalgia class at [facility] today and did water aerobics; pt's husband states that pt may have pushed herself too hard; pt used oxycodone at home without relief; pt reports left neck pain that extends down her back and into her left leg in addition to hurting all over; 250mg  fentanyl given by EMS without relief.

## 2016-04-09 NOTE — ED Notes (Signed)
Pt requesting second dose of dilaudid and phenergan

## 2016-04-13 ENCOUNTER — Emergency Department (HOSPITAL_COMMUNITY)
Admission: EM | Admit: 2016-04-13 | Discharge: 2016-04-14 | Disposition: A | Discharge status: Home / Self Care | Payer: Medicaid Other | Source: Home / Self Care | Attending: Emergency Medicine | Admitting: Emergency Medicine

## 2016-04-13 ENCOUNTER — Encounter (HOSPITAL_COMMUNITY): Payer: Self-pay | Admitting: Emergency Medicine

## 2016-04-13 DIAGNOSIS — Z79899 Other long term (current) drug therapy: Secondary | ICD-10-CM | POA: Insufficient documentation

## 2016-04-13 DIAGNOSIS — Z8543 Personal history of malignant neoplasm of ovary: Secondary | ICD-10-CM | POA: Insufficient documentation

## 2016-04-13 DIAGNOSIS — G8929 Other chronic pain: Secondary | ICD-10-CM | POA: Diagnosis not present

## 2016-04-13 DIAGNOSIS — M549 Dorsalgia, unspecified: Principal | ICD-10-CM

## 2016-04-13 DIAGNOSIS — M5442 Lumbago with sciatica, left side: Secondary | ICD-10-CM

## 2016-04-13 DIAGNOSIS — F17210 Nicotine dependence, cigarettes, uncomplicated: Secondary | ICD-10-CM | POA: Insufficient documentation

## 2016-04-13 DIAGNOSIS — M5432 Sciatica, left side: Secondary | ICD-10-CM

## 2016-04-13 DIAGNOSIS — R109 Unspecified abdominal pain: Secondary | ICD-10-CM | POA: Diagnosis not present

## 2016-04-13 DIAGNOSIS — F319 Bipolar disorder, unspecified: Secondary | ICD-10-CM | POA: Diagnosis not present

## 2016-04-13 DIAGNOSIS — M069 Rheumatoid arthritis, unspecified: Secondary | ICD-10-CM | POA: Diagnosis not present

## 2016-04-13 NOTE — ED Notes (Signed)
Pt BIB EMS from home c/o sciatica flare-up starting at 8pm; pain occurring on left lower back radiating down to left foot; pt grunting and yelling in triage; pt spitting into emesis bag; no vomiting has occurred; pt denies injury; pt took oxycodone, roxicodone, muscle relaxer, and tylenol 325mg , without relief before husband called ambulance.

## 2016-04-14 ENCOUNTER — Ambulatory Visit (INDEPENDENT_AMBULATORY_CARE_PROVIDER_SITE_OTHER): Payer: Self-pay | Admitting: Family Medicine

## 2016-04-14 ENCOUNTER — Emergency Department (HOSPITAL_COMMUNITY)
Admission: EM | Admit: 2016-04-14 | Discharge: 2016-04-14 | Disposition: A | Discharge status: Home / Self Care | Payer: Medicaid Other | Attending: Emergency Medicine | Admitting: Emergency Medicine

## 2016-04-14 ENCOUNTER — Encounter (HOSPITAL_COMMUNITY): Payer: Self-pay | Admitting: Emergency Medicine

## 2016-04-14 ENCOUNTER — Emergency Department (HOSPITAL_COMMUNITY): Payer: Medicaid Other

## 2016-04-14 VITALS — HR 85 | Resp 17

## 2016-04-14 DIAGNOSIS — M545 Low back pain, unspecified: Secondary | ICD-10-CM

## 2016-04-14 DIAGNOSIS — M549 Dorsalgia, unspecified: Secondary | ICD-10-CM

## 2016-04-14 DIAGNOSIS — R112 Nausea with vomiting, unspecified: Secondary | ICD-10-CM

## 2016-04-14 DIAGNOSIS — M5432 Sciatica, left side: Secondary | ICD-10-CM

## 2016-04-14 DIAGNOSIS — F319 Bipolar disorder, unspecified: Secondary | ICD-10-CM | POA: Insufficient documentation

## 2016-04-14 DIAGNOSIS — F17210 Nicotine dependence, cigarettes, uncomplicated: Secondary | ICD-10-CM | POA: Insufficient documentation

## 2016-04-14 DIAGNOSIS — G8929 Other chronic pain: Secondary | ICD-10-CM

## 2016-04-14 DIAGNOSIS — M069 Rheumatoid arthritis, unspecified: Secondary | ICD-10-CM | POA: Insufficient documentation

## 2016-04-14 DIAGNOSIS — R109 Unspecified abdominal pain: Secondary | ICD-10-CM | POA: Insufficient documentation

## 2016-04-14 DIAGNOSIS — M5442 Lumbago with sciatica, left side: Secondary | ICD-10-CM | POA: Insufficient documentation

## 2016-04-14 LAB — CBC WITH DIFFERENTIAL/PLATELET
BASOS ABS: 0 10*3/uL (ref 0.0–0.1)
Basophils Relative: 0 %
EOS PCT: 0 %
Eosinophils Absolute: 0 10*3/uL (ref 0.0–0.7)
HEMATOCRIT: 39.7 % (ref 36.0–46.0)
Hemoglobin: 13.5 g/dL (ref 12.0–15.0)
LYMPHS PCT: 12 %
Lymphs Abs: 1.7 10*3/uL (ref 0.7–4.0)
MCH: 32.3 pg (ref 26.0–34.0)
MCHC: 34 g/dL (ref 30.0–36.0)
MCV: 95 fL (ref 78.0–100.0)
MONO ABS: 1.2 10*3/uL — AB (ref 0.1–1.0)
MONOS PCT: 8 %
Neutro Abs: 11.9 10*3/uL — ABNORMAL HIGH (ref 1.7–7.7)
Neutrophils Relative %: 80 %
PLATELETS: 191 10*3/uL (ref 150–400)
RBC: 4.18 MIL/uL (ref 3.87–5.11)
RDW: 12.5 % (ref 11.5–15.5)
WBC: 14.8 10*3/uL — ABNORMAL HIGH (ref 4.0–10.5)

## 2016-04-14 LAB — I-STAT CHEM 8, ED
BUN: 11 mg/dL (ref 6–20)
CALCIUM ION: 1.13 mmol/L (ref 1.12–1.23)
CREATININE: 0.4 mg/dL — AB (ref 0.44–1.00)
Chloride: 103 mmol/L (ref 101–111)
GLUCOSE: 91 mg/dL (ref 65–99)
HCT: 41 % (ref 36.0–46.0)
Hemoglobin: 13.9 g/dL (ref 12.0–15.0)
Potassium: 3.8 mmol/L (ref 3.5–5.1)
Sodium: 140 mmol/L (ref 135–145)
TCO2: 24 mmol/L (ref 0–100)

## 2016-04-14 MED ORDER — PROMETHAZINE HCL 25 MG/ML IJ SOLN
25.0000 mg | Freq: Once | INTRAMUSCULAR | Status: AC
  Administered 2016-04-14: 25 mg via INTRAMUSCULAR

## 2016-04-14 MED ORDER — HYDROMORPHONE HCL 1 MG/ML IJ SOLN
1.0000 mg | Freq: Once | INTRAMUSCULAR | Status: AC
Start: 2016-04-14 — End: 2016-04-14
  Administered 2016-04-14: 1 mg via INTRAVENOUS
  Filled 2016-04-14: qty 1

## 2016-04-14 MED ORDER — LORAZEPAM 2 MG/ML IJ SOLN
1.0000 mg | Freq: Once | INTRAMUSCULAR | Status: AC
  Administered 2016-04-14: 1 mg via INTRAVENOUS
  Filled 2016-04-14: qty 1

## 2016-04-14 MED ORDER — LIDOCAINE 5 % EX PTCH
2.0000 | MEDICATED_PATCH | CUTANEOUS | Status: DC
  Administered 2016-04-14: 2 via TRANSDERMAL
  Filled 2016-04-14 (×2): qty 2

## 2016-04-14 MED ORDER — METHOCARBAMOL 1000 MG/10ML IJ SOLN: 1000.0000 mg | Freq: Once | INTRAMUSCULAR | Status: DC

## 2016-04-14 MED ORDER — PROMETHAZINE HCL 25 MG PO TABS
25.0000 mg | ORAL_TABLET | Freq: Once | ORAL | Status: AC
  Administered 2016-04-14: 25 mg via ORAL
  Filled 2016-04-14: qty 1

## 2016-04-14 MED ORDER — DEXTROSE 5 % IV SOLN
1000.0000 mg | Freq: Once | INTRAVENOUS | Status: AC
  Administered 2016-04-14: 1000 mg via INTRAVENOUS
  Filled 2016-04-14: qty 10

## 2016-04-14 MED ORDER — HYDROMORPHONE HCL 1 MG/ML IJ SOLN
1.0000 mg | Freq: Once | INTRAMUSCULAR | Status: AC
  Administered 2016-04-14: 1 mg via INTRAVENOUS
  Filled 2016-04-14: qty 1

## 2016-04-14 MED ORDER — LIDOCAINE 5 % EX PTCH: 2.0000 | MEDICATED_PATCH | CUTANEOUS | Status: DC

## 2016-04-14 MED ORDER — DEXAMETHASONE SODIUM PHOSPHATE 10 MG/ML IJ SOLN
10.0000 mg | Freq: Once | INTRAMUSCULAR | Status: AC
  Administered 2016-04-14: 10 mg via INTRAVENOUS
  Filled 2016-04-14: qty 1

## 2016-04-14 MED ORDER — DIPHENHYDRAMINE HCL 50 MG/ML IJ SOLN
12.5000 mg | Freq: Once | INTRAMUSCULAR | Status: AC
  Administered 2016-04-14: 12.5 mg via INTRAVENOUS
  Filled 2016-04-14: qty 1

## 2016-04-14 MED ORDER — HYDROMORPHONE HCL 1 MG/ML IJ SOLN
2.0000 mg | Freq: Once | INTRAMUSCULAR | Status: AC
  Administered 2016-04-14: 2 mg via INTRAVENOUS
  Filled 2016-04-14: qty 2

## 2016-04-14 MED ORDER — HALOPERIDOL LACTATE 5 MG/ML IJ SOLN
2.0000 mg | Freq: Once | INTRAMUSCULAR | Status: AC
  Administered 2016-04-14: 2 mg via INTRAVENOUS
  Filled 2016-04-14: qty 1

## 2016-04-14 MED ORDER — PROMETHAZINE HCL 25 MG/ML IJ SOLN
12.5000 mg | Freq: Once | INTRAMUSCULAR | Status: AC
  Administered 2016-04-14: 12.5 mg via INTRAVENOUS
  Filled 2016-04-14: qty 1

## 2016-04-14 NOTE — ED Provider Notes (Signed)
CSN: [id number]     Arrival date & time 04/13/16  2251 History   First MD Initiated Contact with Patient 04/14/16 0052     Chief Complaint  Patient presents with  . Back Pain     (Consider location/radiation/quality/duration/timing/severity/associated sxs/prior Treatment) HPI Comments: Patient with a history of fibromyalgia, chronic pain, GERD, anorexia, endometriosis presents with complaint of worsening pain from known sciatica. She was seen 04/09/16 in the emergency department for same and reports pain is not better on the steroids she was given. She takes oxycodone regularly and reports this is not managing her current symptoms. No urinary or bowel incontinence. She reports the pain is bad enough that walking is difficult. She states the pain is bad enough to cause nausea and vomiting. No fever.  Patient is a 40 y.o. female presenting with back pain. The history is provided by the patient. No language interpreter was used.  Back Pain Location:  Lumbar spine Quality:  Stabbing and aching Radiates to:  L posterior upper leg Pain severity:  Severe Duration:  4 days Timing:  Constant Chronicity:  Chronic Associated symptoms: no chest pain, no fever and no weakness     Past Medical History  Diagnosis Date  . ABDOMINAL WALL HERNIA 02/27/2010  . ALLERGIC RHINITIS 06/02/2007  . ANOREXIA, CHRONIC 08/07/2008  . ANXIETY 09/15/2010  . BACK PAIN, THORACIC REGION 07/07/2007  . CERVICAL RADICULOPATHY 12/11/2008  . Condyloma acuminatum 04/23/2009  . CONSTIPATION 09/15/2010  . DYSPHAGIA UNSPECIFIED 09/09/2009  . 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 ENDOMETRIOSIS 12/16/2009  . FATIGUE 06/11/2010  . GERD 10/23/2009  . HERPES SIMPLEX INFECTION 06/11/2010  . LENTIGO 04/23/2009  . LUNG NODULE 06/17/2010  . Palpitations 10/23/2009  . Stricture and stenosis of esophagus 05/13/2010  . TOBACCO ABUSE 08/07/2008  . Heart palpitations   . Urinary tract infection   . Rheumatoid arthritis(714.0)   . Ovarian cyst   .  Abnormal Pap smear   . Fibromyalgia   . Narcotic abuse, continuous   . Panic disorder   . Bipolar disorder [facility])    Past Surgical History  Procedure Laterality Date  . Partial hysterectomy    . Endometrial ablation    . Hernia repair      X3  . Dilation and curettage of uterus      x1   . Cesarean section      x2   . Esophageal dilatation x 4    . Gum surgery    . Abdominal surgery    . Abdominal wall mesh  removal     Family History  Problem Relation Age of Onset  . Depression Mother   . Arthritis Mother   . Hyperlipidemia Father   . Hypertension Father    Social History  Substance Use Topics  . Smoking status: Current Every Day Smoker -- 1.00 packs/day    Types: Cigarettes  . Smokeless tobacco: Never Used  . Alcohol Use: No     Comment: has odor of etoh on breath   OB History    Gravida Para Term Preterm AB TAB SAB Ectopic Multiple Living   3 2 2  0 1 0 1 0 0 2     Review of Systems  Constitutional: Negative for fever and chills.  Respiratory: Negative.  Negative for shortness of breath.   Cardiovascular: Negative.  Negative for chest pain.  Gastrointestinal: Positive for nausea and vomiting.  Genitourinary: Negative for enuresis.  Musculoskeletal: Positive for back pain.  Skin: Negative.  Negative for wound.  Neurological: Negative.  Negative for weakness.      Allergies  Lamictal; Nicotine; Zofran; Chantix; Codeine; Ibuprofen; Metaxalone; Morphine and related; Ondansetron; Skelaxin; Sulfa antibiotics; Toradol; Amoxicillin; and Penicillins  Home Medications   Prior to Admission medications   Medication Sig Start Date End Date Taking? Authorizing Provider  acetaminophen (TYLENOL) 325 MG tablet Take 325 mg by mouth every 4 (four) hours as needed for pain.    Yes Historical Provider, MD  ALPRAZolam Duanne Moron) 1 MG tablet Take 1 mg by mouth 4 (four) times daily as needed for anxiety.   Yes Historical Provider, MD  escitalopram (LEXAPRO) 20 MG tablet Take 30  mg by mouth at bedtime.   Yes Historical Provider, MD  lurasidone (LATUDA) 80 MG TABS tablet Take 80 mg by mouth at bedtime.    Yes Historical Provider, MD  methocarbamol (ROBAXIN) 750 MG tablet Take 750 mg by mouth every 8 (eight) hours as needed for muscle spasms.   Yes Historical Provider, MD  Oxycodone HCl 10 MG TABS take 1 tablet by mouth four times a day if needed for severe pain 05/06/15  Yes Historical Provider, MD  predniSONE (STERAPRED UNI-PAK 21 TAB) 10 MG (21) TBPK tablet Take 1 tablet (10 mg total) by mouth daily. Take 6 tabs by mouth daily  for 2 days, then 5 tabs for 2 days, then 4 tabs for 2 days, then 3 tabs for 2 days, 2 tabs for 2 days, then 1 tab by mouth daily for 2 days 04/09/16  Yes Isla Pence, MD  ziprasidone (GEODON) 60 MG capsule take 1 capsule by mouth every evening with food 04/06/16  Yes Historical Provider, MD  escitalopram (LEXAPRO) 10 MG tablet Take 1 tablet (10 mg total) by mouth at bedtime. Patient not taking: Reported on 04/09/2016 05/22/15   Janith Lima, MD   BP 94/67 mmHg  Pulse 68  Temp(Src) 98.8 F (37.1 C) (Oral)  Resp 16  SpO2 95% Physical Exam  Constitutional: She is oriented to person, place, and time. She appears well-developed and well-nourished.  HENT:  Head: Normocephalic.  Neck: Normal range of motion. Neck supple.  Cardiovascular: Normal rate and regular rhythm.   Pulmonary/Chest: Effort normal and breath sounds normal.  Abdominal: Soft. Bowel sounds are normal. There is no tenderness. There is no rebound and no guarding.  Musculoskeletal: Normal range of motion.  Lower back tenderness to light touch, left greater than right. No swelling. Equal strength on plantar and dorsiflexion.   Neurological: She is alert and oriented to person, place, and time.  Reflexes are equal in LE's.   Skin: Skin is warm and dry. No rash noted.  Psychiatric: She has a normal mood and affect.    ED Course  Procedures (including critical care time) Labs  Review Labs Reviewed - No data to display  Imaging Review No results found. I have personally reviewed and evaluated these images and lab results as part of my medical decision-making.   EKG Interpretation None      MDM   Final diagnoses:  None    1. Left sciatica  Patient with a history of chronic pain presents with persistent left back pain she states is severe. Attempt to address her needs included haldol - which she feels she reacted to requiring Benadryl - Robaxin IV, Decadron, IV Dilaudid (x1) and PO Phenergan. She initially refused phenergan by mouth stating she needed to receive through the IV because of her vomiting, but agreed to take it by mouth when PR  phenergan was offered. Further treatment options were limited given the patient's extensive allergy list. Outpatient MRI was offered.  The patient voices discontent with how her pain has been managed in the ED and that she has been "labeled" as a person seeking treatment of chronic pain. She states clearly that dilaudid is the only thing that works for her pain and she requested phenergan be given IV and is requesting IV Ativan "because that's what I get when I come to the ED". These were declined. Dr. Randal Buba was asked to become involved in patient care. She has seen and evaluated the patient, discussed hospital policy regarding treating chronic pain int he ED and finds her stable for discharge home.   [place] Controlled database and Care Everywhere were reviewed. She has an established history of visits for sciatic/back pain although the patient denies she has "chronic pain". The patient receives #120 10 mg oxycodone on a regular schedule from Dr. Doy Hutching in [facility] and #120 1 mg Ativan from Dr. Reece Levy in [place]. She is on a steroid taper dose. She has Robaxin and outpatient MRI has been scheduled at patient's request.     Charlann Lange, PA-C 04/14/16 0533  April Palumbo, MD 04/14/16 [phone]

## 2016-04-14 NOTE — Progress Notes (Signed)
Subjective:  By signing my name below, I, Gina [name], attest that this documentation has been prepared under the direction and in the presence of Gina Ray, MD. Electronically Signed: Moises [name], [place]. 04/14/2016 , 2:04 PM .  Patient was seen in Room 6 .   Patient ID: Gina [name], female    DOB: April 03, 1976, 40 y.o.   MRN: [id number] Chief Complaint  Patient presents with  . Back Pain    lower back pain and left leg pain,seen at [facility] ED last night and this morning    HPI Gina [name] is a 40 y.o. female  Patient was brought back emergently due to complaining of severe pain with screaming. She was discharged from the ED this morning and states she wasn't able to walk at that time. She was given dilaudid 1mg  this morning at the ED. When she went home, she ran upstairs to help her grandmother who was in the shower. Patient notes that this reinjured her. She also mentions having an episode of urinary incontinence when she woke up this morning but no other episodes. She's taken oxycodone, tylenol and robaxin previously without relief. Her last oxycodone was taken about an hour ago. She is on oxycodone 10mg  4 times a day.   Review of ED note, history of chronic pain from sciatica and fibromyalgia. Per their note, she requested further doses of dilaudid, concern she had not been given sufficient dilaudid for her pain. She had just been seen 4 days previously for similar pain including left sciatica. Per ER note, last prescription oxycodone 10mg  #120 was on May 29th. Started on prednisone taper on June 9th. She was treated last night with robaxin IV, and decadron IV. Patient requested IV ativan and dilaudid. Outpatient MRI was scheduled.   She is brought in by her mother.   Patient Active Problem List   Diagnosis Date Noted  . Bipolar 1 disorder, mixed, moderate (HCC) 07/15/2015    Class: Chronic  . 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 Sciatica 01/09/2015    . Reactive hypoglycemia 12/17/2013  . Musculoskeletal malfunction arising from mental factors 04/04/2013  . Incisional hernia 11/15/2012  . Other postprocedural status(V45.89) 11/15/2012  . Abdominal pain 11/11/2012  . Chronic pain syndrome 11/11/2012  . Disorder of sacrum 11/11/2012  . Heartburn 10/04/2011  . DYSPHAGIA 10/04/2011  . Depression with anxiety 09/15/2010  . CONSTIPATION 09/15/2010  . LUNG NODULE 06/17/2010  . HERPES SIMPLEX INFECTION 06/11/2010  . FATIGUE 06/11/2010  . STRICTURE AND STENOSIS OF ESOPHAGUS 05/13/2010  . ABDOMINAL WALL HERNIA 02/27/2010  . ENDOMETRIOSIS 12/16/2009  . GERD 10/23/2009  . PALPITATIONS 10/23/2009  . 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 CONDYLOMA ACUMINATUM 04/23/2009  . LENTIGO 04/23/2009  . DENTAL PAIN 03/19/2009  . CERVICAL RADICULOPATHY 12/11/2008  . 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 TOBACCO ABUSE 08/07/2008  . BACK PAIN, THORACIC REGION 07/07/2007  . ALLERGIC RHINITIS 06/02/2007   Past Medical History  Diagnosis Date  . ABDOMINAL WALL HERNIA 02/27/2010  . ALLERGIC RHINITIS 06/02/2007  . ANOREXIA, CHRONIC 08/07/2008  . ANXIETY 09/15/2010  . BACK PAIN, THORACIC REGION 07/07/2007  . CERVICAL RADICULOPATHY 12/11/2008  . Condyloma acuminatum 04/23/2009  . CONSTIPATION 09/15/2010  . DYSPHAGIA UNSPECIFIED 09/09/2009  . 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 ENDOMETRIOSIS 12/16/2009  . FATIGUE 06/11/2010  . GERD 10/23/2009  . HERPES SIMPLEX INFECTION 06/11/2010  . LENTIGO 04/23/2009  . LUNG NODULE 06/17/2010  . Palpitations 10/23/2009  .  Stricture and stenosis of esophagus 05/13/2010  . TOBACCO ABUSE 08/07/2008  . Heart palpitations   . Urinary tract infection   . Rheumatoid arthritis(714.0)   . Ovarian cyst   . Abnormal Pap smear   . Fibromyalgia   . Narcotic abuse, continuous   . Panic disorder   . Bipolar disorder [facility])    Past Surgical History  Procedure Laterality Date  .  Partial hysterectomy    . Endometrial ablation    . Hernia repair      X3  . Dilation and curettage of uterus      x1   . Cesarean section      x2   . Esophageal dilatation x 4    . Gum surgery    . Abdominal surgery    . Abdominal wall mesh  removal     Allergies  Allergen Reactions  . Lamictal [Lamotrigine] Anaphylaxis    Other reaction(s): Urticaria / Hives (ALLERGY)  . Nicotine Swelling and Palpitations    Other reaction(s): Respiratory Distress (ALLERGY/intolerance), Swelling (ALLERGY/intolerance), Tachycardia / Palpitations  (intolerance) Patches, lozenges Patches caused palpations lozenges throat swelled  . Zofran Anaphylaxis  . Chantix [Varenicline Tartrate] Other (See Comments)     Diaphoresis, sweating.  Night terrors.  "went crazy"  . Codeine     Other reaction(s): Nausea & Vomiting (ALLERGY/intolerance)  . Ibuprofen Nausea And Vomiting  . Metaxalone     Other reaction(s): Other (See Comments) Pt does not remember reaction  . Morphine And Related Other (See Comments)    Makes pt "very mean"  . Ondansetron     Other reaction(s): Anaphylaxis (ALLERGY), Swelling (ALLERGY/intolerance)  . Skelaxin Other (See Comments)    rash  . Sulfa Antibiotics Diarrhea  . Toradol [Ketorolac Tromethamine] Nausea And Vomiting    Other reaction(s): Nausea & Vomiting (ALLERGY/intolerance)  . Amoxicillin Rash  . Penicillins Rash    Other reaction(s): Urticaria / Hives (ALLERGY)   Prior to Admission medications   Medication Sig Start Date End Date Taking? Authorizing Provider  acetaminophen (TYLENOL) 325 MG tablet Take 325 mg by mouth every 4 (four) hours as needed for pain.    Yes Historical Provider, MD  ALPRAZolam Duanne Moron) 1 MG tablet Take 1 mg by mouth 4 (four) times daily as needed for anxiety.   Yes Historical Provider, MD  escitalopram (LEXAPRO) 10 MG tablet Take 1 tablet (10 mg total) by mouth at bedtime. 05/22/15  Yes Janith Lima, MD  escitalopram (LEXAPRO) 20 MG tablet  Take 30 mg by mouth at bedtime.   Yes Historical Provider, MD  lidocaine (LIDODERM) 5 % Place 2 patches onto the skin daily. Remove & Discard patch within 12 hours or as directed by MD 04/14/16  Yes Charlann Lange, PA-C  lurasidone (LATUDA) 80 MG TABS tablet Take 80 mg by mouth at bedtime.    Yes Historical Provider, MD  methocarbamol (ROBAXIN) 750 MG tablet Take 750 mg by mouth every 8 (eight) hours as needed for muscle spasms.   Yes Historical Provider, MD  Oxycodone HCl 10 MG TABS take 1 tablet by mouth four times a day if needed for severe pain 05/06/15  Yes Historical Provider, MD  ziprasidone (GEODON) 60 MG capsule take 1 capsule by mouth every evening with food 04/06/16  Yes Historical Provider, MD   Social History   Social History  . Marital Status: Married    Spouse Name: N/A  . Number of Children: N/A  . Years of Education: N/A   Occupational  History  . Not on file.   Social History Main Topics  . Smoking status: Current Every Day Smoker -- 1.00 packs/day    Types: Cigarettes  . Smokeless tobacco: Never Used  . Alcohol Use: No     Comment: has odor of etoh on breath  . Drug Use: No  . Sexual Activity: Yes    Birth Control/ Protection: Surgical   Other Topics Concern  . Not on file   Social History Narrative   Review of Systems  Constitutional: Negative for fever, chills and fatigue.  Gastrointestinal: Positive for nausea and vomiting. Negative for abdominal pain, diarrhea and constipation.  Musculoskeletal: Positive for myalgias, back pain, arthralgias and gait problem.  Skin: Negative for rash and wound.       Objective:   Physical Exam  Constitutional: She is oriented to person, place, and time. She appears well-developed and well-nourished. She appears distressed (Pain in left low back/leg with difficulty finding comfortable position in office.).  HENT:  Head: Normocephalic and atraumatic.  Eyes: EOM are normal. Pupils are equal, round, and reactive to light.    Neck: Neck supple.  Cardiovascular: Normal rate, regular rhythm and normal heart sounds.   Pulses:      Dorsalis pedis pulses are 2+ on the right side, and 2+ on the left side.  warm toes , cap refill <1 sec but guarded exam  Pulmonary/Chest: Effort normal and breath sounds normal. No respiratory distress.  Musculoskeletal: Normal range of motion.  lidocaine patch left lower back intact., skin intact, diffusely tender with retracting and guarding of left lower back to sciatic notch with light palpation; withdraws with light touch, NVI distally; attempted strength testing: minimal plantar flexion and unable to dorsiflex her left foot. Difficult/guarded exam.  Neurological: She is alert and oriented to person, place, and time.  Skin: Skin is warm and dry.  Psychiatric: She has a normal mood and affect. Her behavior is normal.  Nursing note and vitals reviewed.  She's vomited small amount twice during evaluation.   Filed Vitals:   04/14/16 1402  BP: 152/91  Pulse: 85  Resp: 17  SpO2: 97%   EMS was called approx 1:10PM IV attempted x2 without success 2:36PM report given to EMS for care     Assessment & Plan:    Ishya Salih is a 40 y.o. female Chronic low back pain - Plan: promethazine (PHENERGAN) injection 25 mg  Left sided sciatica - Plan: promethazine (PHENERGAN) injection 25 mg  Nausea and vomiting, intractability of vomiting not specified, unspecified vomiting type - Plan: promethazine (PHENERGAN) injection 25 mg  History of chronic low back pain, on oxycodone 10 mg 4 times a day,  recurrent sciatica, with flare of symptoms this morning after bending down to assist someone in the shower. Now with severe pain.  Patient seen immediately in room 6 due to severity of pain and some difficulty getting her into the office to begin with by the staff.  Guarded exam, but neurovascularly intact distally.  ER note reviewed from earlier this morning when she was discharged with plan for  outpatient MRI. Does admit to one episode of urinary incontinence this morning, but appears to be more of difficulty getting to the bathroom in time. Has not had any fecal incontinence or repeat urine incontinence since this morning, denies any saddle anesthesia. Guarded/difficult exam for strength testing lower extremities.    - IV attempted 2 without success.  -Phenergan 25 mg IM given for nausea/vomiting.  -EMS called for  transport for further evaluation through emergency room.  Meds ordered this encounter  Medications  . promethazine (PHENERGAN) injection 25 mg    Sig:    Patient Instructions       IF you received an x-[name] today, you will receive an invoice from [facility]. Please contact [facility] at [phone] with questions or concerns regarding your invoice.   IF you received labwork today, you will receive an invoice from [employer]. Please contact [facility] at [phone] with questions or concerns regarding your invoice.   Our billing staff will not be able to assist you with questions regarding bills from these companies.  You will be contacted with the lab results as soon as they are available. The fastest way to get your results is to activate your My Chart account. Instructions are located on the last page of this paperwork. If you have not heard from [place] regarding the results in 2 weeks, please contact this office.     Based on the severity of your pain currently, you do need to be evaluated further through the emergency room to determine if other imaging or other medication treatment is needed.      I personally performed the services described in this documentation, which was scribed in my presence. The recorded information has been reviewed and considered, and addended by me as needed.   Signed,   Gina Ray, MD [facility].  04/14/2016 2:38 PM

## 2016-04-14 NOTE — Discharge Instructions (Signed)

## 2016-04-14 NOTE — ED Notes (Signed)
Pt requesting bedside commode; reports she is unable to stand or walk. This RN recommending bedpan for patient safety. Pt not agreeing to this, continuing to stand. Pt nearly fell but with two people was caught and sat on bedside on commode. Pt wearing yellow socks and fall band.

## 2016-04-14 NOTE — ED Notes (Signed)
PA at bedside.

## 2016-04-14 NOTE — ED Notes (Signed)
Pt states she wants ativan and phenergan; PA made aware

## 2016-04-14 NOTE — ED Provider Notes (Signed)
CSN: [id number]     Arrival date & time 04/14/16  1548 History   First MD Initiated Contact with Patient 04/14/16 1613     Chief Complaint  Patient presents with  . Sciatica      The history is provided by the patient.  patient presents with back pain. She has chronic back and abdominal pain with chronic fibromyalgia.She states that 5 days ago she was at a meeting to help deal with her fibromyalgia which began to have more pain. States that she developed a couple days and went to the ER. States she had given steroids and medicines at [facility] long hospital felt a little bit better. States that she then later had to go up and help lift her grandmother up and began to have more pain in the left lower back and down the leg. States she has a history of chronic back pain but she gets sciatica at times. She states they did not treat her well and [facility] because they would not give her all the Dilaudid that she needed. States she has a very high pain threshold. States that they did order her an MRI instead of doing one for her in the ER. Today she went to urgent medical with severe pain. States the pain is worst on the left side. They were unable to get her pain controlled so she was to the ER. States she is weak in the left leg. States she has tingling in the foot. States she's been having some difficulty urinating on herself because she's been able to get to the bathroom. States she's also been having fevers. Patient states that she needs big dose of  IV Dilaudid along with IV Phenergan. States that she also needs Ativan with it. She is allergic to morphine Toradol tramadol and Zofran.  Past Medical History  Diagnosis Date  . ABDOMINAL WALL HERNIA 02/27/2010  . ALLERGIC RHINITIS 06/02/2007  . ANOREXIA, CHRONIC 08/07/2008  . ANXIETY 09/15/2010  . BACK PAIN, THORACIC REGION 07/07/2007  . CERVICAL RADICULOPATHY 12/11/2008  . Condyloma acuminatum 04/23/2009  . CONSTIPATION 09/15/2010  . DYSPHAGIA  UNSPECIFIED 09/09/2009  . 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 ENDOMETRIOSIS 12/16/2009  . FATIGUE 06/11/2010  . GERD 10/23/2009  . HERPES SIMPLEX INFECTION 06/11/2010  . LENTIGO 04/23/2009  . LUNG NODULE 06/17/2010  . Palpitations 10/23/2009  . Stricture and stenosis of esophagus 05/13/2010  . TOBACCO ABUSE 08/07/2008  . Heart palpitations   . Urinary tract infection   . Rheumatoid arthritis(714.0)   . Ovarian cyst   . Abnormal Pap smear   . Fibromyalgia   . Narcotic abuse, continuous   . Panic disorder   . Bipolar disorder [facility])    Past Surgical History  Procedure Laterality Date  . Partial hysterectomy    . Endometrial ablation    . Hernia repair      X3  . Dilation and curettage of uterus      x1   . Cesarean section      x2   . Esophageal dilatation x 4    . Gum surgery    . Abdominal surgery    . Abdominal wall mesh  removal     Family History  Problem Relation Age of Onset  . Depression Mother   . Arthritis Mother   . Hyperlipidemia Father   . Hypertension Father    Social History  Substance Use Topics  . Smoking status: Current Every Day Smoker -- 1.00 packs/day  Types: Cigarettes  . Smokeless tobacco: Never Used  . Alcohol Use: No     Comment: has odor of etoh on breath   OB History    Gravida Para Term Preterm AB TAB SAB Ectopic Multiple Living   3 2 2  0 1 0 1 0 0 2     Review of Systems  Constitutional: Positive for fever. Negative for appetite change.  Cardiovascular: Negative for chest pain.  Musculoskeletal: Positive for back pain.  Skin: Negative for wound.  Neurological: Positive for weakness and numbness.      Allergies  Lamictal; Morphine and related; Nicotine; Zofran; Chantix; Codeine; Haldol; Ibuprofen; Lidoderm; Metaxalone; Sulfa antibiotics; Toradol; Amoxicillin; and Penicillins  Home Medications   Prior to Admission medications   Medication Sig Start Date End Date Taking? Authorizing Provider  acetaminophen (TYLENOL) 325 MG tablet  Take 325 mg by mouth 4 (four) times daily.    Yes Historical Provider, MD  ALPRAZolam Duanne Moron) 1 MG tablet Take 1 mg by mouth 4 (four) times daily.    Yes Historical Provider, MD  escitalopram (LEXAPRO) 20 MG tablet Take 30 mg by mouth at bedtime.   Yes Historical Provider, MD  lurasidone (LATUDA) 80 MG TABS tablet Take 80 mg by mouth at bedtime.    Yes Historical Provider, MD  methocarbamol (ROBAXIN) 750 MG tablet Take 750 mg by mouth every 8 (eight) hours as needed for muscle spasms.   Yes Historical Provider, MD  Oxycodone HCl 10 MG TABS take 1 tablet by mouth four times a day for severe pain 05/06/15  Yes Historical Provider, MD  predniSONE (DELTASONE) 10 MG tablet Take 10-60 mg by mouth daily. 04/12/16  Yes Historical Provider, MD  ziprasidone (GEODON) 60 MG capsule take 1 capsule by mouth every evening with food 04/06/16  Yes Historical Provider, MD  lidocaine (LIDODERM) 5 % Place 2 patches onto the skin daily. Remove & Discard patch within 12 hours or as directed by MD Patient not taking: Reported on 04/14/2016 04/14/16   Nehemiah Settle Upstill, PA-C   BP 104/73 mmHg  Pulse 63  Temp(Src) 99.4 F (37.4 C) (Rectal)  Resp 18  SpO2 96% Physical Exam  Constitutional: She appears well-developed.  Patient is lying in bed with her left side somewhat raised.  HENT:  Head: Atraumatic.  Cardiovascular: Normal rate.   Pulmonary/Chest: Effort normal.  Abdominal: Soft. There is no tenderness.  Musculoskeletal: She exhibits tenderness.  Tenderness over lumbar spine and left paraspinal area. Decreased range of motion in left hip. Good neurovascularly and right foot. Some difficulty with flexion and extension of the foot and left side. States that his paresthesias on the left lateral foot. Severe pain with exam and somewhat difficult to get a good exam with it.  Skin: Skin is warm. No erythema.    ED Course  Procedures (including critical care time) Labs Review Labs Reviewed  CBC WITH DIFFERENTIAL/PLATELET -  Abnormal; Notable for the following:    WBC 14.8 (*)    Neutro Abs 11.9 (*)    Monocytes Absolute 1.2 (*)    All other components within normal limits  I-STAT CHEM 8, ED - Abnormal; Notable for the following:    Creatinine, Ser 0.40 (*)    All other components within normal limits    Imaging Review Mr Lumbar Spine Wo Contrast  04/14/2016  CLINICAL DATA:  Low back pain. Left lower extremity radicular pain extending to the foot. EXAM: MRI LUMBAR SPINE WITHOUT CONTRAST TECHNIQUE: Multiplanar, multisequence MR imaging of the  lumbar spine was performed. No intravenous contrast was administered. COMPARISON:  MRI of the lumbar spine 02/23/2013. CT of the abdomen pelvis 07/02/2015. FINDINGS: Segmentation: 5 non rib-bearing lumbar type vertebral bodies are present. Alignment: AP alignment is anatomic. Mild leftward curvature of the lumbar spine is centered at T12-L1. Vertebrae: Chronic inferior endplate Schmorl's node is present anteriorly at L1. New anterior fatty endplate marrow changes are present at L5-S1. Marrow signal and vertebral body heights are otherwise normal. Conus medullaris: Extends to the L1-2 level and appears normal. Paraspinal and other soft tissues: Limited imaging of the abdomen is unremarkable. Disc levels: No significant focal disc protrusion or stenosis is present. The central canal and foramina are patent throughout. IMPRESSION: 1. Stable inferior endplate Schmorl's node at L1. 2. New fatty endplate degenerative changes anteriorly at L5-S1. 3. No focal disc protrusion or stenosis. Electronically Signed   By: San Morelle M.D.   On: 04/14/2016 19:50   I have personally reviewed and evaluated these images and lab results as part of my medical decision-making.   EKG Interpretation None      MDM   Final diagnoses:  Back pain    Patient presented with acute on chronic back pain with worsening sciatica. Upset [place] yesterday because she didn't get all the pain  medicine that she desired. MRI done here due to questionable exam and was overall reassuring. Will discharge home to follow-up as needed. GERD has her pain medicines at home.    Davonna Belling, MD 04/14/16 2017

## 2016-04-14 NOTE — ED Notes (Signed)
Pt to MRI

## 2016-04-14 NOTE — Discharge Instructions (Signed)

## 2016-04-14 NOTE — ED Notes (Signed)
Pt to ER as transfer from [facility] due to severe sciatica pain down left leg. Pt reports this is her 3rd trip to ER in the last 5 days. Reports she was at [facility] long this morning, went home "limping" and reports "catching" her grandma from a fall and her "spasms became out of control." pt received 250 mg fentanyl in route and 25 mg phenergan IM at [facility]. Pt also received 750cc fluid. Pt reports "they usually give me dilaudid and steroids." pt is alert and oriented x4.

## 2016-04-14 NOTE — ED Notes (Signed)
Bedside commode provided

## 2016-04-14 NOTE — ED Notes (Signed)
Pt states she is still experiencing back spasms.

## 2016-04-14 NOTE — Patient Instructions (Signed)
     IF you received an x-ray today, you will receive an invoice from [facility] Radiology. Please contact [facility] Rad[facility] at [phone] with questions or concerns regarding your invoice.   IF you received labwork today, you will receive an invoice from [employer]. Please contact [facility] at [phone] with questions or concerns regarding your invoice.   Our billing staff will not be able to assist you with questions regarding bills from these companies.  You will be contacted with the lab results as soon as they are available. The fastest way to get your results is to activate your My Chart account. Instructions are located on the last page of this paperwork. If you have not heard from [place] regarding the results in 2 weeks, please contact this office.     Based on the severity of your pain currently, you do need to be evaluated further through the emergency room to determine if other imaging or other medication treatment is needed.

## 2016-04-22 DIAGNOSIS — G43711 Chronic migraine without aura, intractable, with status migrainosus: Secondary | ICD-10-CM | POA: Insufficient documentation

## 2016-04-22 DIAGNOSIS — G43009 Migraine without aura, not intractable, without status migrainosus: Secondary | ICD-10-CM | POA: Insufficient documentation

## 2017-03-30 ENCOUNTER — Emergency Department (HOSPITAL_COMMUNITY)
Admission: EM | Admit: 2017-03-30 | Discharge: 2017-03-30 | Disposition: A | Discharge status: Home / Self Care | Payer: Medicaid Other | Attending: Emergency Medicine | Admitting: Emergency Medicine

## 2017-03-30 ENCOUNTER — Encounter (HOSPITAL_COMMUNITY): Payer: Self-pay | Admitting: Emergency Medicine

## 2017-03-30 DIAGNOSIS — G894 Chronic pain syndrome: Secondary | ICD-10-CM

## 2017-03-30 DIAGNOSIS — Z79899 Other long term (current) drug therapy: Secondary | ICD-10-CM | POA: Diagnosis not present

## 2017-03-30 DIAGNOSIS — F17210 Nicotine dependence, cigarettes, uncomplicated: Secondary | ICD-10-CM | POA: Insufficient documentation

## 2017-03-30 DIAGNOSIS — M545 Low back pain: Secondary | ICD-10-CM | POA: Diagnosis present

## 2017-03-30 DIAGNOSIS — Z79891 Long term (current) use of opiate analgesic: Secondary | ICD-10-CM | POA: Diagnosis not present

## 2017-03-30 DIAGNOSIS — Z7982 Long term (current) use of aspirin: Secondary | ICD-10-CM | POA: Diagnosis not present

## 2017-03-30 DIAGNOSIS — M5432 Sciatica, left side: Secondary | ICD-10-CM | POA: Diagnosis not present

## 2017-03-30 MED ORDER — LORAZEPAM 2 MG/ML IJ SOLN
1.0000 mg | Freq: Once | INTRAMUSCULAR | Status: AC
  Administered 2017-03-30: 1 mg via INTRAVENOUS
  Filled 2017-03-30: qty 1

## 2017-03-30 MED ORDER — PROMETHAZINE HCL 25 MG/ML IJ SOLN
12.5000 mg | Freq: Once | INTRAMUSCULAR | Status: AC
Start: 2017-03-30 — End: 2017-03-30
  Administered 2017-03-30: 12.5 mg via INTRAVENOUS
  Filled 2017-03-30: qty 1

## 2017-03-30 MED ORDER — HYDROMORPHONE HCL 1 MG/ML IJ SOLN
1.0000 mg | Freq: Once | INTRAMUSCULAR | Status: AC
  Administered 2017-03-30: 1 mg via INTRAVENOUS
  Filled 2017-03-30: qty 1

## 2017-03-30 NOTE — Discharge Instructions (Signed)
Follow-up with your pain doctor as planned 

## 2017-03-30 NOTE — ED Provider Notes (Signed)
[facility] DEPT Provider Note   CSN: [id number] Arrival date & time: 03/30/17  1815     History   Chief Complaint Chief Complaint  Patient presents with  . Sciatica    HPI Gina [name] is a 41 y.o. female.  She presents for evaluation of lower back pain, left-sided radiating to left leg typical of her usual sciatica.  He states she is taking her usual medicines for chronic pain including oxycodone and Robaxin.  She denies change in other medical parameters.  She has not had any trauma.  She denies dysuria, urinary frequency, nausea or vomiting.  She states that she went to her pain doctor before coming here and was sent here for evaluation.  She receives chronic pain medications from Dr. Ella Jubilee.  There are no other known modifying factors.  HPI  Past Medical History:  Diagnosis Date  . ABDOMINAL WALL HERNIA 02/27/2010  . Abnormal Pap smear   . ALLERGIC RHINITIS 06/02/2007  . ANOREXIA, CHRONIC 08/07/2008  . ANXIETY 09/15/2010  . BACK PAIN, THORACIC REGION 07/07/2007  . Bipolar disorder ([place])   . CERVICAL RADICULOPATHY 12/11/2008  . Condyloma acuminatum 04/23/2009  . CONSTIPATION 09/15/2010  . DYSPHAGIA UNSPECIFIED 09/09/2009  . 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 ENDOMETRIOSIS 12/16/2009  . FATIGUE 06/11/2010  . Fibromyalgia   . GERD 10/23/2009  . Heart palpitations   . HERPES SIMPLEX INFECTION 06/11/2010  . LENTIGO 04/23/2009  . LUNG NODULE 06/17/2010  . Narcotic abuse, continuous   . Ovarian cyst   . Palpitations 10/23/2009  . Panic disorder   . Rheumatoid arthritis(714.0)   . Stricture and stenosis of esophagus 05/13/2010  . TOBACCO ABUSE 08/07/2008  . Urinary tract infection     Patient Active Problem List   Diagnosis Date Noted  . Bipolar 1 disorder, mixed, moderate (HCC) 07/15/2015    Class: Chronic  . 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 Sciatica 01/09/2015  . Reactive hypoglycemia 12/17/2013  . Musculoskeletal malfunction arising from mental  factors 04/04/2013  . Incisional hernia 11/15/2012  . Other postprocedural status(V45.89) 11/15/2012  . Abdominal pain 11/11/2012  . Chronic pain syndrome 11/11/2012  . Disorder of sacrum 11/11/2012  . Heartburn 10/04/2011  . DYSPHAGIA 10/04/2011  . Depression with anxiety 09/15/2010  . CONSTIPATION 09/15/2010  . LUNG NODULE 06/17/2010  . HERPES SIMPLEX INFECTION 06/11/2010  . FATIGUE 06/11/2010  . STRICTURE AND STENOSIS OF ESOPHAGUS 05/13/2010  . ABDOMINAL WALL HERNIA 02/27/2010  . ENDOMETRIOSIS 12/16/2009  . GERD 10/23/2009  . PALPITATIONS 10/23/2009  . 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 CONDYLOMA ACUMINATUM 04/23/2009  . LENTIGO 04/23/2009  . DENTAL PAIN 03/19/2009  . CERVICAL RADICULOPATHY 12/11/2008  . 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 TOBACCO ABUSE 08/07/2008  . BACK PAIN, THORACIC REGION 07/07/2007  . ALLERGIC RHINITIS 06/02/2007    Past Surgical History:  Procedure Laterality Date  . ABDOMINAL SURGERY    . ABDOMINAL WALL MESH  REMOVAL    . CESAREAN SECTION     x2   . DILATION AND CURETTAGE OF UTERUS     x1   . ENDOMETRIAL ABLATION    . esophageal dilatation x 4    . GUM SURGERY    . HERNIA REPAIR     X3  . PARTIAL HYSTERECTOMY      OB History    Gravida Para Term Preterm AB Living   3 2 2  0 1 2   SAB TAB Ectopic Multiple Live Births  1 0 0 0 1       Home Medications    Prior to Admission medications   Medication Sig Start Date End Date Taking? Authorizing Provider  acetaminophen (TYLENOL) 325 MG tablet Take 325 mg by mouth 4 (four) times daily.     [provider]  ALPRAZolam Duanne Moron) 1 MG tablet Take 1 mg by mouth 4 (four) times daily.     [provider]  escitalopram (LEXAPRO) 20 MG tablet Take 30 mg by mouth at bedtime.    [provider]  lidocaine (LIDODERM) 5 % Place 2 patches onto the skin daily. Remove & Discard patch within 12 hours or as directed by  MD Patient not taking: Reported on 04/14/2016 04/14/16   Charlann Lange, PA-C  lurasidone (LATUDA) 80 MG TABS tablet Take 80 mg by mouth at bedtime.     [provider]  methocarbamol (ROBAXIN) 750 MG tablet Take 750 mg by mouth every 8 (eight) hours as needed for muscle spasms.    [provider]  Oxycodone HCl 10 MG TABS take 1 tablet by mouth four times a day for severe pain 05/06/15   [provider]  predniSONE (DELTASONE) 10 MG tablet Take 10-60 mg by mouth daily. 04/12/16   [provider]  ziprasidone (GEODON) 60 MG capsule take 1 capsule by mouth every evening with food 04/06/16   [provider]    Family History Family History  Problem Relation Age of Onset  . Depression Mother   . Arthritis Mother   . Hyperlipidemia Father   . Hypertension Father     Social History Social History  Substance Use Topics  . Smoking status: Current Every Day Smoker    Packs/day: 1.00    Types: Cigarettes  . Smokeless tobacco: Never Used  . Alcohol use No     Allergies   Lamictal [lamotrigine]; Morphine and related; Nicotine; Zofran; Chantix [varenicline tartrate]; Codeine; Haldol [haloperidol]; Ibuprofen; Lidoderm [lidocaine]; Metaxalone; Sulfa antibiotics; Toradol [ketorolac tromethamine]; Amoxicillin; and Penicillins   Review of Systems Review of Systems  All other systems reviewed and are negative.    Physical Exam Updated Vital Signs BP 93/65 Comment: pt always has low BP per pt  Pulse 65   Temp 98.2 F (36.8 C) (Oral)   Resp 18   SpO2 98%   Physical Exam  Constitutional: She is oriented to person, place, and time. She appears well-developed and well-nourished. She appears distressed (She is uncomfortable).  HENT:  Head: Normocephalic and atraumatic.  Eyes: Conjunctivae and EOM are normal. Pupils are equal, round, and reactive to light.  Neck: Normal range of motion and phonation normal. Neck supple.  Cardiovascular: Normal rate  and regular rhythm.   Pulmonary/Chest: Effort normal and breath sounds normal. She exhibits no tenderness.  Abdominal: Soft. She exhibits no distension. There is no tenderness. There is no guarding.  Musculoskeletal:  Decreased range of motion or back secondary to pain.  Tender left lumbar.  Neurological: She is alert and oriented to person, place, and time. She exhibits normal muscle tone.  Skin: Skin is warm and dry.  Psychiatric: She has a normal mood and affect. Her behavior is normal. Judgment and thought content normal.  Nursing note and vitals reviewed.    ED Treatments / Results  Labs (all labs ordered are listed, but only abnormal results are displayed) Labs Reviewed - No data to display  EKG  EKG Interpretation None       Radiology No results found.  Procedures Procedures (including critical care time)  Medications Ordered in ED Medications  HYDROmorphone (DILAUDID) injection 1 mg (1 mg Intravenous Given 03/30/17 1943)  LORazepam (ATIVAN) injection 1 mg (1 mg Intravenous Given 03/30/17 1943)  promethazine (PHENERGAN) injection 12.5 mg (12.5 mg Intravenous Given 03/30/17 1943)  HYDROmorphone (DILAUDID) injection 1 mg (1 mg Intravenous Given 03/30/17 2113)  LORazepam (ATIVAN) injection 1 mg (1 mg Intravenous Given 03/30/17 2113)  promethazine (PHENERGAN) injection 12.5 mg (12.5 mg Intravenous Given 03/30/17 2113)     Initial Impression / Assessment and Plan / ED Course  I have reviewed the triage vital signs and the nursing notes.  Pertinent labs & imaging results that were available during my care of the patient were reviewed by me and considered in my medical decision making (see chart for details).      Patient Vitals for the past 24 hrs:  BP Temp Temp src Pulse Resp SpO2  03/30/17 2157 93/65 - - 65 18 98 %  03/30/17 2057 (!) 81/55 98.2 F (36.8 C) Oral 67 20 94 %  03/30/17 1850 (!) 94/52 98.6 F (37 C) Oral 83 12 97 %    At D/C Reevaluation with update  and discussion. After initial assessment and treatment, an updated evaluation reveals she is more comfortable and has no further complaints.  Findings discussed with the patient, all questions answered. Ismar Yabut L    Final Clinical Impressions(s) / ED Diagnoses   Final diagnoses:  Sciatica of left side  Chronic pain syndrome   Chronic pain with exacerbation, doubt fracture, cauda equina syndrome, myelopathy.  Nursing Notes Reviewed/ Care Coordinated Applicable Imaging Reviewed Interpretation of Laboratory Data incorporated into ED treatment  The patient appears reasonably screened and/or stabilized for discharge and I doubt any other medical condition or other [facility] requiring further screening, evaluation, or treatment in the ED at this time prior to discharge.  Plan: Home Medications-continue usual; Home Treatments-rest, heat; return here if the recommended treatment, does not improve the symptoms; Recommended follow up-PCP, as needed   New Prescriptions Discharge Medication List as of 03/30/2017  9:36 PM       Daleen Bo, MD 03/30/17 2339

## 2017-03-30 NOTE — ED Triage Notes (Signed)
Pt from home via EMS.  Complaining of sciatica pain for last three days, worsening today.  Also reports possible endometriosis pain.  A&Ox4.  States that she is unable to walk.  Given 250 fentanyl.  IV RAC 20 G VS: 98/54 (pt states she runs low), 94 bpm, 20 RR, 98% RA

## 2017-04-04 ENCOUNTER — Encounter (HOSPITAL_COMMUNITY): Payer: Self-pay

## 2017-04-04 ENCOUNTER — Emergency Department (HOSPITAL_COMMUNITY)
Admission: EM | Admit: 2017-04-04 | Discharge: 2017-04-04 | Disposition: A | Discharge status: Home / Self Care | Payer: Medicaid Other | Attending: Emergency Medicine | Admitting: Emergency Medicine

## 2017-04-04 DIAGNOSIS — G8929 Other chronic pain: Secondary | ICD-10-CM | POA: Diagnosis not present

## 2017-04-04 DIAGNOSIS — Z79899 Other long term (current) drug therapy: Secondary | ICD-10-CM | POA: Diagnosis not present

## 2017-04-04 DIAGNOSIS — F17210 Nicotine dependence, cigarettes, uncomplicated: Secondary | ICD-10-CM | POA: Diagnosis not present

## 2017-04-04 DIAGNOSIS — M5440 Lumbago with sciatica, unspecified side: Secondary | ICD-10-CM | POA: Insufficient documentation

## 2017-04-04 MED ORDER — HYDROMORPHONE HCL 1 MG/ML IJ SOLN
2.0000 mg | Freq: Once | INTRAMUSCULAR | Status: AC
  Administered 2017-04-04: 2 mg via INTRAVENOUS
  Filled 2017-04-04: qty 2

## 2017-04-04 MED ORDER — PREDNISONE 10 MG (21) PO TBPK: ORAL_TABLET | Freq: Every day | ORAL | 0 refills | Status: DC

## 2017-04-04 MED ORDER — METHYLPREDNISOLONE SODIUM SUCC 125 MG IJ SOLR
125.0000 mg | Freq: Once | INTRAMUSCULAR | Status: AC
  Administered 2017-04-04: 125 mg via INTRAVENOUS
  Filled 2017-04-04: qty 2

## 2017-04-04 MED ORDER — LORAZEPAM 2 MG/ML IJ SOLN
1.0000 mg | Freq: Once | INTRAMUSCULAR | Status: AC
  Administered 2017-04-04: 1 mg via INTRAVENOUS
  Filled 2017-04-04: qty 1

## 2017-04-04 NOTE — ED Triage Notes (Signed)
Per EMS- left sided sciatic pain w/ back spasms since 1430 since afternoon. Has taken twice amount of oxycodone as normal with no improvement.

## 2017-04-04 NOTE — ED Notes (Signed)
Pt refused to let me take discharge vitals.

## 2017-04-04 NOTE — ED Provider Notes (Addendum)
[facility] DEPT Provider Note   CSN: [id number] Arrival date & time: 04/04/17  2022     History   Chief Complaint Chief Complaint  Patient presents with  . Back Pain    left    HPI Gina [name] is a 41 y.o. female.  41 year old female with history of chronic back pain and sciatica as well as fibromyalgia presents with worsening left lower lumbar pain after she was lifting a child and a pool today. No bowel or bladder dysfunction. No recent fever or chills. Review of the [facility] prescription database shows that she is scheduled to receive her two-week supply of narcotic tomorrow. She also notes that she is going to have an injection by her pain management specialist. She is requesting hydromorphone and Ativan by name. She states that this is only thing that works for her pain. Patient states that the pain is sharp and worse with any movement. No true use prior to arrival.      Past Medical History:  Diagnosis Date  . ABDOMINAL WALL HERNIA 02/27/2010  . Abnormal Pap smear   . ALLERGIC RHINITIS 06/02/2007  . ANOREXIA, CHRONIC 08/07/2008  . ANXIETY 09/15/2010  . BACK PAIN, THORACIC REGION 07/07/2007  . Bipolar disorder ([place])   . CERVICAL RADICULOPATHY 12/11/2008  . Condyloma acuminatum 04/23/2009  . CONSTIPATION 09/15/2010  . DYSPHAGIA UNSPECIFIED 09/09/2009  . 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 ENDOMETRIOSIS 12/16/2009  . FATIGUE 06/11/2010  . Fibromyalgia   . GERD 10/23/2009  . Heart palpitations   . HERPES SIMPLEX INFECTION 06/11/2010  . LENTIGO 04/23/2009  . LUNG NODULE 06/17/2010  . Narcotic abuse, continuous   . Ovarian cyst   . Palpitations 10/23/2009  . Panic disorder   . Rheumatoid arthritis(714.0)   . Stricture and stenosis of esophagus 05/13/2010  . TOBACCO ABUSE 08/07/2008  . Urinary tract infection     Patient Active Problem List   Diagnosis Date Noted  . Bipolar 1 disorder, mixed, moderate (HCC) 07/15/2015    Class: Chronic  . Positive reaction to  tuberculin skin test 06/01/2015  . Sciatica 01/09/2015  . Reactive hypoglycemia 12/17/2013  . Musculoskeletal malfunction arising from mental factors 04/04/2013  . Incisional hernia 11/15/2012  . Other postprocedural status(V45.89) 11/15/2012  . Abdominal pain 11/11/2012  . Chronic pain syndrome 11/11/2012  . Disorder of sacrum 11/11/2012  . Heartburn 10/04/2011  . DYSPHAGIA 10/04/2011  . Depression with anxiety 09/15/2010  . CONSTIPATION 09/15/2010  . LUNG NODULE 06/17/2010  . HERPES SIMPLEX INFECTION 06/11/2010  . FATIGUE 06/11/2010  . STRICTURE AND STENOSIS OF ESOPHAGUS 05/13/2010  . ABDOMINAL WALL HERNIA 02/27/2010  . ENDOMETRIOSIS 12/16/2009  . GERD 10/23/2009  . PALPITATIONS 10/23/2009  . 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 CONDYLOMA ACUMINATUM 04/23/2009  . LENTIGO 04/23/2009  . DENTAL PAIN 03/19/2009  . CERVICAL RADICULOPATHY 12/11/2008  . 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 TOBACCO ABUSE 08/07/2008  . BACK PAIN, THORACIC REGION 07/07/2007  . ALLERGIC RHINITIS 06/02/2007    Past Surgical History:  Procedure Laterality Date  . ABDOMINAL SURGERY    . ABDOMINAL WALL MESH  REMOVAL    . CESAREAN SECTION     x2   . DILATION AND CURETTAGE OF UTERUS     x1   . ENDOMETRIAL ABLATION    . esophageal dilatation x 4    . GUM SURGERY    . HERNIA REPAIR     X3  . PARTIAL HYSTERECTOMY  OB History    Gravida Para Term Preterm AB Living   3 2 2  0 1 2   SAB TAB Ectopic Multiple Live Births   1 0 0 0 1       Home Medications    Prior to Admission medications   Medication Sig Start Date End Date Taking? Authorizing Provider  acetaminophen (TYLENOL) 325 MG tablet Take 325 mg by mouth 4 (four) times daily.     [provider]  ALPRAZolam Duanne Moron) 1 MG tablet Take 1 mg by mouth 4 (four) times daily.     [provider]  escitalopram (LEXAPRO) 20 MG tablet Take 30 mg by mouth at bedtime.     [provider]  lidocaine (LIDODERM) 5 % Place 2 patches onto the skin daily. Remove & Discard patch within 12 hours or as directed by MD Patient not taking: Reported on 04/14/2016 04/14/16   Charlann Lange, PA-C  lurasidone (LATUDA) 80 MG TABS tablet Take 80 mg by mouth at bedtime.     [provider]  methocarbamol (ROBAXIN) 750 MG tablet Take 750 mg by mouth every 8 (eight) hours as needed for muscle spasms.    [provider]  Oxycodone HCl 10 MG TABS take 1 tablet by mouth four times a day for severe pain 05/06/15   [provider]  predniSONE (DELTASONE) 10 MG tablet Take 10-60 mg by mouth daily. 04/12/16   [provider]  ziprasidone (GEODON) 60 MG capsule take 1 capsule by mouth every evening with food 04/06/16   [provider]    Family History Family History  Problem Relation Age of Onset  . Depression Mother   . Arthritis Mother   . Hyperlipidemia Father   . Hypertension Father     Social History Social History  Substance Use Topics  . Smoking status: Current Every Day Smoker    Packs/day: 1.00    Types: Cigarettes  . Smokeless tobacco: Never Used  . Alcohol use No     Allergies   Lamictal [lamotrigine]; Morphine and related; Nicotine; Zofran; Chantix [varenicline tartrate]; Codeine; Haldol [haloperidol]; Ibuprofen; Lidoderm [lidocaine]; Metaxalone; Sulfa antibiotics; Toradol [ketorolac tromethamine]; Amoxicillin; and Penicillins   Review of Systems Review of Systems  All other systems reviewed and are negative.    Physical Exam Updated Vital Signs BP (!) 86/53 (BP Location: Left Arm)   Pulse 85   Temp 98.7 F (37.1 C) (Oral)   Resp 20   SpO2 97%   Physical Exam  Constitutional: She is oriented to person, place, and time. She appears well-developed and well-nourished.  Non-toxic appearance. No distress.  HENT:  Head: Normocephalic and atraumatic.  Eyes: Conjunctivae, EOM and lids are normal. Pupils are  equal, round, and reactive to light.  Neck: Normal range of motion. Neck supple. No tracheal deviation present. No thyroid mass present.  Cardiovascular: Normal rate, regular rhythm and normal heart sounds.  Exam reveals no gallop.   No murmur heard. Pulmonary/Chest: Effort normal and breath sounds normal. No stridor. No respiratory distress. She has no decreased breath sounds. She has no wheezes. She has no rhonchi. She has no rales.  Abdominal: Soft. Normal appearance and bowel sounds are normal. She exhibits no distension. There is no tenderness. There is no rebound and no CVA tenderness.  Musculoskeletal: Normal range of motion. She exhibits no edema or tenderness.       Back:  Neurological: She is alert and oriented to person, place, and time. She has normal  strength. No cranial nerve deficit or sensory deficit. GCS eye subscore is 4. GCS verbal subscore is 5. GCS motor subscore is 6.  Skin: Skin is warm and dry. No abrasion and no rash noted.  Psychiatric: Her speech is normal and behavior is normal. Her mood appears anxious.  Nursing note and vitals reviewed.    ED Treatments / Results  Labs (all labs ordered are listed, but only abnormal results are displayed) Labs Reviewed - No data to display  EKG  EKG Interpretation None       Radiology No results found.  Procedures Procedures (including critical care time)  Medications Ordered in ED Medications  HYDROmorphone (DILAUDID) injection 2 mg (not administered)  LORazepam (ATIVAN) injection 1 mg (not administered)     Initial Impression / Assessment and Plan / ED Course  I have reviewed the triage vital signs and the nursing notes.  Pertinent labs & imaging results that were available during my care of the patient were reviewed by me and considered in my medical decision making (see chart for details).    Patient's blood pressure noted and this is her baseline. Pt medicated for pain here and feels better No focal  deficits Will be placed on steroid taper  Final Clinical Impressions(s) / ED Diagnoses   Final diagnoses:  None    New Prescriptions New Prescriptions   No medications on file     Lacretia Leigh, MD 04/04/17 2215    Lacretia Leigh, MD 04/04/17 2216

## 2017-04-09 ENCOUNTER — Encounter (HOSPITAL_COMMUNITY): Payer: Self-pay | Admitting: Nurse Practitioner

## 2017-04-09 ENCOUNTER — Emergency Department (HOSPITAL_COMMUNITY): Payer: Medicaid Other

## 2017-04-09 ENCOUNTER — Emergency Department (HOSPITAL_COMMUNITY)
Admission: EM | Admit: 2017-04-09 | Discharge: 2017-04-09 | Disposition: A | Discharge status: Home / Self Care | Payer: Medicaid Other | Attending: Emergency Medicine | Admitting: Emergency Medicine

## 2017-04-09 DIAGNOSIS — G8929 Other chronic pain: Secondary | ICD-10-CM

## 2017-04-09 DIAGNOSIS — F17210 Nicotine dependence, cigarettes, uncomplicated: Secondary | ICD-10-CM | POA: Insufficient documentation

## 2017-04-09 DIAGNOSIS — Z79899 Other long term (current) drug therapy: Secondary | ICD-10-CM | POA: Diagnosis not present

## 2017-04-09 DIAGNOSIS — M62830 Muscle spasm of back: Secondary | ICD-10-CM | POA: Diagnosis not present

## 2017-04-09 DIAGNOSIS — M51369 Other intervertebral disc degeneration, lumbar region without mention of lumbar back pain or lower extremity pain: Secondary | ICD-10-CM

## 2017-04-09 DIAGNOSIS — M5442 Lumbago with sciatica, left side: Secondary | ICD-10-CM | POA: Diagnosis not present

## 2017-04-09 DIAGNOSIS — Z765 Malingerer [conscious simulation]: Secondary | ICD-10-CM | POA: Diagnosis not present

## 2017-04-09 DIAGNOSIS — R112 Nausea with vomiting, unspecified: Secondary | ICD-10-CM | POA: Diagnosis not present

## 2017-04-09 DIAGNOSIS — T148XXA Other injury of unspecified body region, initial encounter: Secondary | ICD-10-CM

## 2017-04-09 DIAGNOSIS — M5136 Other intervertebral disc degeneration, lumbar region: Secondary | ICD-10-CM | POA: Diagnosis not present

## 2017-04-09 DIAGNOSIS — M549 Dorsalgia, unspecified: Secondary | ICD-10-CM | POA: Diagnosis present

## 2017-04-09 LAB — URINALYSIS, ROUTINE W REFLEX MICROSCOPIC
Bilirubin Urine: NEGATIVE
GLUCOSE, UA: NEGATIVE mg/dL
Hgb urine dipstick: NEGATIVE
Ketones, ur: NEGATIVE mg/dL
LEUKOCYTES UA: NEGATIVE
Nitrite: NEGATIVE
PROTEIN: NEGATIVE mg/dL
Specific Gravity, Urine: 1.009 (ref 1.005–1.030)
pH: 7 (ref 5.0–8.0)

## 2017-04-09 LAB — POC URINE PREG, ED: PREG TEST UR: NEGATIVE

## 2017-04-09 MED ORDER — PROMETHAZINE HCL 25 MG/ML IJ SOLN
25.0000 mg | Freq: Once | INTRAMUSCULAR | Status: AC
  Administered 2017-04-09: 25 mg via INTRAMUSCULAR
  Filled 2017-04-09: qty 1

## 2017-04-09 MED ORDER — METHOCARBAMOL 1000 MG/10ML IJ SOLN
500.0000 mg | Freq: Once | INTRAMUSCULAR | Status: AC
Start: 2017-04-09 — End: 2017-04-09
  Administered 2017-04-09: 500 mg via INTRAMUSCULAR
  Filled 2017-04-09: qty 5

## 2017-04-09 MED ORDER — DEXAMETHASONE SODIUM PHOSPHATE 10 MG/ML IJ SOLN
10.0000 mg | Freq: Once | INTRAMUSCULAR | Status: AC
  Administered 2017-04-09: 10 mg via INTRAMUSCULAR
  Filled 2017-04-09: qty 1

## 2017-04-09 MED ORDER — PROMETHAZINE HCL 25 MG RE SUPP: 25.0000 mg | Freq: Four times a day (QID) | RECTAL | 0 refills | Status: DC | PRN

## 2017-04-09 NOTE — ED Provider Notes (Signed)
[facility] DEPT Provider Note   CSN: [id number] Arrival date & time: 04/09/17  1745     History   Chief Complaint Chief Complaint  Patient presents with  . Back Pain    Chronic Sciatic Pain    HPI Gina [name] is a 41 y.o. female with a PMHx of chronic back pain, sciatica, fibromyalgia, and other medical conditions listed below, who presents to the ED with complaints of acute on chronic L lower back pain that worsened 5 days ago when she lifted a little boy in the pool. Pt states she has chronic back pain, but reports it worsened since this incident this past Monday. Chart review reveals she was seen in the ED on Monday 04/04/17 and given IV dilaudid 2mg  with IV lorazepam 1mg  x2 as well as IV solumedrol 125mg , sent home on prednisone taper; she was also seen in the ED on 03/30/17 and given IV dilaudid 1mg  x2, IV lorazepam 1mg  x2, and IV phenergan 12.5mg  x2 before being discharged. Patient states that despite her home medications, she continues to have pain and developed nausea and vomiting (3x NBNB) today. She describes the pain is 10/10 constant sharp and stabbing left lower back pain that radiates into the left leg, worse with sitting and walking, and unrelieved with oxycodone, Tylenol, Robaxin, and prednisone. She has an appointment with neurology in 3 days for evaluation of her chronic back pain. She also reports that in the past she's been given shots in her back that resolved her back pain for about one year. She is requesting IV Dilaudid and lorazepam as soon as evaluation started today. Of note, she gets chronic narcotics including percocet 7.5-325mg  and clonazepam 1mg , which she filled 04/05/17.   She denies fevers, chills, CP, SOB, abd pain, hematemesis, melena, hematochezia, diarrhea, constipation, hematuria, dysuria, vaginal bleeding/discharge, incontinence of urine/stool, saddle anesthesia/cauda equina symptoms, myalgias, arthralgias, new/worsening numbnesstingling, focal weakness, or  any other complaints at this time. Denies recent falls.    The history is provided by the patient and medical records. No language interpreter was used.  Back Pain   This is a chronic problem. The current episode started more than 2 days ago. The problem occurs constantly. The problem has not changed since onset.The pain is associated with lifting heavy objects. The pain is present in the lumbar spine. The quality of the pain is described as stabbing. The pain radiates to the left thigh, left knee and left foot. The pain is at a severity of 10/10. The pain is severe. The symptoms are aggravated by certain positions. The pain is the same all the time. Pertinent negatives include no chest pain, no fever, no numbness, no abdominal pain, no bowel incontinence, no perianal numbness, no bladder incontinence, no dysuria, no paresthesias, no paresis, no tingling and no weakness. She has tried muscle relaxants and analgesics (prednisone, robaxin, tylenol, and oxycodone) for the symptoms. The treatment provided no relief.    Past Medical History:  Diagnosis Date  . ABDOMINAL WALL HERNIA 02/27/2010  . Abnormal Pap smear   . ALLERGIC RHINITIS 06/02/2007  . ANOREXIA, CHRONIC 08/07/2008  . ANXIETY 09/15/2010  . BACK PAIN, THORACIC REGION 07/07/2007  . Bipolar disorder ([place])   . CERVICAL RADICULOPATHY 12/11/2008  . Condyloma acuminatum 04/23/2009  . CONSTIPATION 09/15/2010  . DYSPHAGIA UNSPECIFIED 09/09/2009  . 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 ENDOMETRIOSIS 12/16/2009  . FATIGUE 06/11/2010  . Fibromyalgia   . GERD 10/23/2009  . Heart palpitations   . HERPES SIMPLEX INFECTION 06/11/2010  .  LENTIGO 04/23/2009  . LUNG NODULE 06/17/2010  . Narcotic abuse, continuous   . Ovarian cyst   . Palpitations 10/23/2009  . Panic disorder   . Rheumatoid arthritis(714.0)   . Stricture and stenosis of esophagus 05/13/2010  . TOBACCO ABUSE 08/07/2008  . Urinary tract infection     Patient Active Problem List   Diagnosis Date  Noted  . Bipolar 1 disorder, mixed, moderate (HCC) 07/15/2015    Class: Chronic  . 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 Sciatica 01/09/2015  . Reactive hypoglycemia 12/17/2013  . Musculoskeletal malfunction arising from mental factors 04/04/2013  . Incisional hernia 11/15/2012  . Other postprocedural status(V45.89) 11/15/2012  . Abdominal pain 11/11/2012  . Chronic pain syndrome 11/11/2012  . Disorder of sacrum 11/11/2012  . Heartburn 10/04/2011  . DYSPHAGIA 10/04/2011  . Depression with anxiety 09/15/2010  . CONSTIPATION 09/15/2010  . LUNG NODULE 06/17/2010  . HERPES SIMPLEX INFECTION 06/11/2010  . FATIGUE 06/11/2010  . STRICTURE AND STENOSIS OF ESOPHAGUS 05/13/2010  . ABDOMINAL WALL HERNIA 02/27/2010  . ENDOMETRIOSIS 12/16/2009  . GERD 10/23/2009  . PALPITATIONS 10/23/2009  . 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 CONDYLOMA ACUMINATUM 04/23/2009  . LENTIGO 04/23/2009  . DENTAL PAIN 03/19/2009  . CERVICAL RADICULOPATHY 12/11/2008  . 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 TOBACCO ABUSE 08/07/2008  . BACK PAIN, THORACIC REGION 07/07/2007  . ALLERGIC RHINITIS 06/02/2007    Past Surgical History:  Procedure Laterality Date  . ABDOMINAL SURGERY    . ABDOMINAL WALL MESH  REMOVAL    . CESAREAN SECTION     x2   . DILATION AND CURETTAGE OF UTERUS     x1   . ENDOMETRIAL ABLATION    . esophageal dilatation x 4    . GUM SURGERY    . HERNIA REPAIR     X3  . PARTIAL HYSTERECTOMY      OB History    Gravida Para Term Preterm AB Living   3 2 2  0 1 2   SAB TAB Ectopic Multiple Live Births   1 0 0 0 1       Home Medications    Prior to Admission medications   Medication Sig Start Date End Date Taking? Authorizing Provider  acetaminophen (TYLENOL) 325 MG tablet Take 325 mg by mouth 4 (four) times daily.     [provider]  ALPRAZolam Duanne Moron) 1 MG tablet Take 1 mg by mouth 4 (four) times  daily.     [provider]  escitalopram (LEXAPRO) 20 MG tablet Take 30 mg by mouth at bedtime.    [provider]  lidocaine (LIDODERM) 5 % Place 2 patches onto the skin daily. Remove & Discard patch within 12 hours or as directed by MD Patient not taking: Reported on 04/14/2016 04/14/16   Charlann Lange, PA-C  lurasidone (LATUDA) 80 MG TABS tablet Take 80 mg by mouth at bedtime.     [provider]  methocarbamol (ROBAXIN) 750 MG tablet Take 750 mg by mouth every 8 (eight) hours as needed for muscle spasms.    [provider]  Oxycodone HCl 10 MG TABS take 1 tablet by mouth four times a day for severe pain 05/06/15   [provider]  predniSONE (DELTASONE) 10 MG tablet Take 10-60 mg by mouth daily. 04/12/16   [provider]  predniSONE (STERAPRED UNI-PAK 21 TAB) 10 MG (21) TBPK tablet Take by mouth daily. Take 6 tabs  by mouth daily  for 2 days, then 5 tabs for 2 days, then 4 tabs for 2 days, then 3 tabs for 2 days, 2 tabs for 2 days, then 1 tab by mouth daily for 2 days 04/04/17   Lacretia Leigh, MD  ziprasidone (GEODON) 60 MG capsule take 1 capsule by mouth every evening with food 04/06/16   [provider]    Family History Family History  Problem Relation Age of Onset  . Depression Mother   . Arthritis Mother   . Hyperlipidemia Father   . Hypertension Father     Social History Social History  Substance Use Topics  . Smoking status: Current Every Day Smoker    Packs/day: 1.00    Types: Cigarettes  . Smokeless tobacco: Never Used  . Alcohol use No     Allergies   Lamictal [lamotrigine]; Morphine and related; Nicotine; Zofran; Chantix [varenicline tartrate]; Codeine; Haldol [haloperidol]; Ibuprofen; Lidoderm [lidocaine]; Metaxalone; Sulfa antibiotics; Toradol [ketorolac tromethamine]; Amoxicillin; and Penicillins   Review of Systems Review of Systems  Constitutional: Negative for chills and fever.  Respiratory: Negative  for shortness of breath.   Cardiovascular: Negative for chest pain.  Gastrointestinal: Positive for nausea and vomiting. Negative for abdominal pain, blood in stool, bowel incontinence, constipation and diarrhea.  Genitourinary: Negative for bladder incontinence, difficulty urinating (no incontinence), dysuria, hematuria, vaginal bleeding and vaginal discharge.  Musculoskeletal: Positive for back pain. Negative for arthralgias and myalgias.  Skin: Negative for color change.  Allergic/Immunologic: Negative for immunocompromised state.  Neurological: Negative for tingling, weakness, numbness and paresthesias.  Psychiatric/Behavioral: Negative for confusion.   All other systems reviewed and are negative for acute change except as noted in the HPI.    Physical Exam Updated Vital Signs BP (!) 95/58 (BP Location: Left Arm)   Pulse 80   Temp 98.3 F (36.8 C)   SpO2 98%   Physical Exam  Constitutional: She is oriented to person, place, and time. Vital signs are normal. She appears well-developed and well-nourished.  Non-toxic appearance. No distress.  Afebrile, nontoxic, NAD when I walk in, but once I'm in the room pt starts intermittently screaming; BP noted to be 90s/50s which seems to be pt's baseline, similar to all prior visits  HENT:  Head: Normocephalic and atraumatic.  Mouth/Throat: Oropharynx is clear and moist and mucous membranes are normal.  Eyes: Conjunctivae and EOM are normal. Right eye exhibits no discharge. Left eye exhibits no discharge.  Neck: Normal range of motion. Neck supple. No spinous process tenderness and no muscular tenderness present. No neck rigidity. Normal range of motion present.  FROM intact without spinous process TTP, no bony stepoffs or deformities, no paraspinous muscle TTP or muscle spasms. No rigidity or meningeal signs. No bruising or swelling.   Cardiovascular: Normal rate, regular rhythm, normal heart sounds and intact distal pulses.  Exam reveals no  gallop and no friction rub.   No murmur heard. Pulmonary/Chest: Effort normal and breath sounds normal. No respiratory distress. She has no decreased breath sounds. She has no wheezes. She has no rhonchi. She has no rales.  Abdominal: Soft. Normal appearance and bowel sounds are normal. She exhibits no distension. There is no tenderness. There is no rigidity, no rebound, no guarding, no CVA tenderness, no tenderness at McBurney's point and negative Murphy's sign.  Soft, NTND, +BS throughout, no r/g/r, neg murphy's, neg mcburney's, no CVA TTP   Musculoskeletal:       Lumbar back: She exhibits decreased range of motion (due  to pain/patient refusal), tenderness, bony tenderness and spasm. She exhibits no swelling and no deformity.       Back:  Lumbar spine with limited ROM due to pain/patient refusal to move, with diffuse midline spinous process TTP, no bony stepoffs or deformities, and with mild b/l paraspinous muscle TTP and muscle spasms. Pt refuses to lay flat, and refuses to sit upright without assistance by her significant other. Distal strength and sensation grossly intact in all extremities, able to move both legs without assistance, +SLR of L leg, gait not assessed due to pt refusing. No overlying skin changes. Distal pulses intact. Pt refusing to ambulate.  Neurological: She is alert and oriented to person, place, and time. She has normal strength. No sensory deficit.  Skin: Skin is warm, dry and intact. No rash noted.  Psychiatric: She has a normal mood and affect.  Nursing note and vitals reviewed.    ED Treatments / Results  Labs (all labs ordered are listed, but only abnormal results are displayed) Labs Reviewed  URINALYSIS, ROUTINE W REFLEX MICROSCOPIC  POC URINE PREG, ED    EKG  EKG Interpretation None       Radiology Dg Lumbar Spine Complete  Result Date: 04/09/2017 CLINICAL DATA:  Low back pain and left sciatica. EXAM: LUMBAR SPINE - COMPLETE 4+ VIEW COMPARISON:   Lumbar spine MR dated 04/14/2016. FINDINGS: Five non-rib-bearing lumbar vertebrae. Mild dextroconvex lumbar rotary scoliosis. Minimal anterior spur formation at multiple levels. No fractures, pars defects or subluxations. IMPRESSION: Minimal degenerative changes.  No acute abnormality. Electronically Signed   By: Claudie Revering M.D.   On: 04/09/2017 19:19     MRI Lumbar spine 04/14/16: Study Result  CLINICAL DATA:  Low back pain. Left lower extremity radicular pain extending to the foot.  EXAM: MRI LUMBAR SPINE WITHOUT CONTRAST  TECHNIQUE: Multiplanar, multisequence MR imaging of the lumbar spine was performed. No intravenous contrast was administered.  COMPARISON:  MRI of the lumbar spine 02/23/2013. CT of the abdomen pelvis 07/02/2015.  FINDINGS: Segmentation: 5 non rib-bearing lumbar type vertebral bodies are present.  Alignment: AP alignment is anatomic. Mild leftward curvature of the lumbar spine is centered at T12-L1.  Vertebrae: Chronic inferior endplate Schmorl's node is present anteriorly at L1. New anterior fatty endplate marrow changes are present at L5-S1. Marrow signal and vertebral body heights are otherwise normal.  Conus medullaris: Extends to the L1-2 level and appears normal.  Paraspinal and other soft tissues: Limited imaging of the abdomen is unremarkable.  Disc levels:  No significant focal disc protrusion or stenosis is present. The central canal and foramina are patent throughout.  IMPRESSION: 1. Stable inferior endplate Schmorl's node at L1. 2. New fatty endplate degenerative changes anteriorly at L5-S1. 3. No focal disc protrusion or stenosis.   Electronically Signed   By: San Morelle M.D.   On: 04/14/2016 19:50     Procedures Procedures (including critical care time)  Medications Ordered in ED Medications  promethazine (PHENERGAN) injection 25 mg (25 mg Intramuscular Given 04/09/17 1936)  methocarbamol (ROBAXIN)  injection 500 mg (500 mg Intramuscular Given 04/09/17 1937)  dexamethasone (DECADRON) injection 10 mg (10 mg Intramuscular Given 04/09/17 1934)     Initial Impression / Assessment and Plan / ED Course  I have reviewed the triage vital signs and the nursing notes.  Pertinent labs & imaging results that were available during my care of the patient were reviewed by me and considered in my medical decision making (see chart for details).  41 y.o. female here with c/o chronic back pain x5 days after lifting a little boy in the pool; she was just seen in the ED on 04/04/17 and given IV dilaudid and lorazepam, sent home on prednisone taper; also seen on ED on 03/30/17 and given IV dilaudid and lorazepam as well. She reports she needs a "shot" in her back, that in the past has helped her; has neurology appt on Tuesday (3 days from now) for consult. Gets chronic narcotics and benzos for her symptoms. On exam, pt screaming intermittently, refuses to lay flat, tenderness to b/l lumbar paraspinous muscles and palpable spasms, but also diffuse midline spinal tenderness in this area; strength and sensation grossly intact in extremities, pt refuses to ambulate due to pain, +SLR on L side. No abdominal or flank tenderness. No s/sx of cauda equina syndrome or cord compression. Had MRI 80yr ago which showed mild L5-S1 facet degenerative findings and Schmorl's node at L1 but otherwise no cord compression or stenosis. Hasn't had xray in quite some time. Will get U/A and Lumbar spine xray, give IM phenergan, robaxin, and decadron; pt requesting dilaudid and ativan by name, advised that we will not be using these at this time, and will first be trying these medications. Of note, [place] reviewed and 1 year search was notable for: regular chronic rx's for clonazepam 1mg  #28 tabs filled 04/05/17 with multiple prior rx's for #42 tabs filled during the prior months (03/22/17, 03/08/17, etc); as well as percocet 7.5-325mg  #56 tabs  dispensed 04/05/17, 03/22/17, 03/08/17, etc; always written by Dr. Sandi Mariscal, and filled at the same pharmacy every time. Will reassess shortly; doubt need for additional labs/imaging at this time.  9:58 PM U/A unremarkable. Upreg neg. Lumbar xray showing minimal degenerative changes but no acute findings, consistent with known DDD previously seen on MRI. Pt states nausea improved, has drink at bedside and appears to have tolerated PO but refuses to state whether she has or not. Pt continues to state her pain has been uncontrolled, despite the fact that upon multiple passes by her room she has seemed calm and laying in bed in NAD. Pt very upset and yelling at me that she requires IV pain medications; I offered several times to give her PO pain meds and she is demanding IV narcotics and benzos; after several attempts to calm her down and explain to her that I am not going to give her IV narcotics/benzos, particularly due to her BP being chronically low, and because this is a chronic pain complaint and she is now no longer vomiting so she can take PO meds, but she continued to yell and become upset, and demand IV meds. She is also refusing any more IM medications as she insists on getting IV meds. Informed pt that she had the choice of accepting PO meds, or she is welcome to be discharged if she refuses to comply with what we're offering. She then said she would be leaving and not come back here, and demanded that I personally remove her IV; I informed her that the nursing staff would be by shortly to discharge her and would take the IV out. Pt demanding to leave, and refusing to ambulate for me before she leaves, however she starts to get up off the bed unassisted and seems to be able to use both legs adequately enough to likely ambulate. Advised my attending of this situation, and my attending agreed that it was reasonable to withhold IV narcotics and offer PO  pain meds and if she is refusing then she is within her  right to leave since that's what she's demanding to do now. I am concerned that this pt is drug seeking for IV narcotics and benzos.  Will send home with phenergan suppositories, advised use of home pain meds including prednisone given at last visit; advised use of heat, and avoidance of heavy lifting. F/up with PCP or chronic pain specialist in 3-5 days for ongoing management of her chronic pain. Pt is stable on discharge, although very irate and verbally abusive to myself.  I explained the diagnosis and have given explicit precautions to return to the ER including for any other new or worsening symptoms. The patient reports understanding of the medical plan as it's been dictated and I have answered their questions as best as I can. Discharge instructions concerning home care and prescriptions have been given. The patient is STABLE and is discharged to home in stable and adequate condition.   Final Clinical Impressions(s) / ED Diagnoses   Final diagnoses:  Chronic bilateral low back pain with left-sided sciatica  Muscle spasm of back  Muscle strain  Nausea and vomiting in adult patient  DDD (degenerative disc disease), lumbar  Drug-seeking behavior    New Prescriptions New Prescriptions   PROMETHAZINE (PHENERGAN) 25 MG SUPPOSITORY    Place 1 suppository (25 mg total) rectally every 6 (six) hours as needed for nausea or vomiting.     [address], [place], [place] 04/09/17 2208    Quintella Reichert, MD 04/10/17 [phone]

## 2017-04-09 NOTE — ED Notes (Signed)
Pt left ED without waiting on discharge papers--- pt and spouse very upset d/t being "discharged home without any better".

## 2017-04-09 NOTE — Discharge Instructions (Signed)
See the instructions below regarding your back pain. Use phenergan as directed as needed for nausea, stay well hydrated. Follow up with your regular doctor in the next 3-5 days for recheck of symptoms and ongoing management of your chronic back pain. Return to the ER for emergent changes or worsening symptoms.   Back Pain: Your back pain should be treated with medicines such as ibuprofen or aleve and this back pain should get better over the next 2 weeks.  However if you develop severe or worsening pain, low back pain with fever, numbness, weakness or inability to walk or urinate, you should return to the ER immediately.  Please follow up with your doctor this week for a recheck if still having symptoms.  Avoid heavy lifting over 10 pounds over the next two weeks.  Low back pain is discomfort in the lower back that may be due to injuries to muscles and ligaments around the spine.  Occasionally, it may be caused by a a problem to a part of the spine called a disc.  The pain may last several days or a week;  However, most patients get completely well in 4 weeks.  Self - care:  The application of heat can help soothe the pain.  Maintaining your daily activities, including walking, is encourged, as it will help you get better faster than just staying in bed. Perform gentle stretching as discussed. Drink plenty of fluids.  Medications are also useful to help with pain control.  A commonly prescribed medication includes your home percocet.  Do not drive or operate heavy machinery while taking this medication.  Non steroidal anti inflammatory medications including Ibuprofen and naproxen;  These medications help both pain and swelling and are very useful in treating back pain.  They should be taken with food, as they can cause stomach upset, and more seriously, stomach bleeding.    Muscle relaxants:  These medications can help with muscle tightness that is a cause of lower back pain.  Most of these medications  can cause drowsiness, and it is not safe to drive or use dangerous machinery while taking them.  Prednisone: use this as instructed by your last ER visit, this should help with the sciatica you're having.   SEEK IMMEDIATE MEDICAL ATTENTION IF: New numbness, tingling, weakness, or problem with the use of your arms or legs.  Severe back pain not relieved with medications.  Difficulty with or loss of control of your bowel or bladder control.  Increasing pain in any areas of the body (such as chest or abdominal pain).  Shortness of breath, dizziness or fainting.  Nausea (feeling sick to your stomach), vomiting, fever, or sweats.  You will need to follow up with  Your primary healthcare provider in 1-2 weeks for reassessment.

## 2017-04-09 NOTE — ED Notes (Signed)
Family at bedside. 

## 2017-04-09 NOTE — ED Notes (Signed)
Bed: WA12 Expected date:  Expected time:  Means of arrival:  Comments: Back pain 

## 2017-04-09 NOTE — ED Notes (Signed)
ED Provider at bedside. 

## 2017-04-09 NOTE — ED Triage Notes (Signed)
Pt is presented from home reporting severe pain from chronic sciatic nerve pain that she "gets treated with a shot every so often" but has been unable to get with her doctor" Received 200 mcg Fentanyl from EMS, pain still a 10/10.

## 2017-04-09 NOTE — ED Notes (Signed)
Patient transported to X-ray 

## 2017-04-10 ENCOUNTER — Emergency Department (HOSPITAL_COMMUNITY)
Admission: EM | Admit: 2017-04-10 | Discharge: 2017-04-10 | Disposition: A | Discharge status: Home / Self Care | Payer: Medicaid Other | Attending: Physician Assistant | Admitting: Physician Assistant

## 2017-04-10 ENCOUNTER — Encounter (HOSPITAL_COMMUNITY): Payer: Self-pay | Admitting: Emergency Medicine

## 2017-04-10 DIAGNOSIS — F1110 Opioid abuse, uncomplicated: Secondary | ICD-10-CM | POA: Diagnosis not present

## 2017-04-10 DIAGNOSIS — F17210 Nicotine dependence, cigarettes, uncomplicated: Secondary | ICD-10-CM | POA: Diagnosis not present

## 2017-04-10 DIAGNOSIS — G894 Chronic pain syndrome: Secondary | ICD-10-CM | POA: Diagnosis not present

## 2017-04-10 DIAGNOSIS — Z79899 Other long term (current) drug therapy: Secondary | ICD-10-CM | POA: Diagnosis not present

## 2017-04-10 DIAGNOSIS — M5432 Sciatica, left side: Secondary | ICD-10-CM | POA: Insufficient documentation

## 2017-04-10 MED ORDER — LORAZEPAM 2 MG/ML IJ SOLN
1.0000 mg | Freq: Once | INTRAMUSCULAR | Status: AC
  Administered 2017-04-10: 1 mg via INTRAVENOUS
  Filled 2017-04-10: qty 1

## 2017-04-10 MED ORDER — PROMETHAZINE HCL 25 MG/ML IJ SOLN
12.5000 mg | Freq: Four times a day (QID) | INTRAMUSCULAR | Status: DC | PRN
  Administered 2017-04-10: 12.5 mg via INTRAVENOUS
  Filled 2017-04-10: qty 1

## 2017-04-10 MED ORDER — HYDROMORPHONE HCL 1 MG/ML IJ SOLN
1.0000 mg | Freq: Once | INTRAMUSCULAR | Status: AC
  Administered 2017-04-10: 1 mg via INTRAVENOUS
  Filled 2017-04-10: qty 1

## 2017-04-10 NOTE — Discharge Instructions (Signed)
Please follow-up with your various specialists for your sciatic pain.

## 2017-04-10 NOTE — ED Provider Notes (Addendum)
[facility] DEPT Provider Note   CSN: [id number] Arrival date & time: 04/10/17  0021  By signing my name below, I, Ephriam Jenkins, attest that this documentation has been prepared under the direction and in the presence of Arieon Corcoran, [place], *. Electronically signed, Ephriam Jenkins, ED Scribe. 04/10/17. 1:32 AM.  History   Chief Complaint Chief Complaint  Patient presents with  . Sciatica    HPI HPI Comments: Gina [name] is a 41 y.o. female, with Hx of chronic back pain, sciatica, narcotic abuse, brought in by ambulance, who presents to the Emergency Department complaining of acute on chronic sciatic pain that worsened 5 days ago. Pt was discharged from [facility] approximately 3 hours ago, seen by [place]. She presents here tonight requesting to be seen by MD. Per [facility] chart:   Of note, [facility] reviewed and 1 year search was notable for: regular chronic rx's for clonazepam 1mg  #28 tabs filled 04/05/17 with multiple prior rx's for #42 tabs filled during the prior months (03/22/17, 03/08/17, etc); as well as percocet 7.5-325mg  #56 tabs dispensed 04/05/17, 03/22/17, 03/08/17, etc; always written by Dr. Sandi Mariscal, and filled at the same pharmacy every time. Pt demanding to leave, and refusing to ambulate for me before she leaves, however she starts to get up off the bed unassisted and seems to be able to use both legs adequately enough to likely ambulate. Advised my attending of this situation, and my attending agreed that it was reasonable to withhold IV narcotics and offer PO pain meds and if she is refusing then she is within her right to leave since that's what she's demanding to do now. I am concerned that this pt is drug seeking for IV narcotics and benzos.   Pt presents with her mother and husband tonight. Pt states that she is having a worse flare up of her sciatic back pain than normal. Husband states that she has had waxing and waning pain during this flare up for the past 2 weeks  and has worsened within the past 5 days. Prior to being seen at [facility] today, husband states that the pt has taken her prescribed 750 Robaxin and Oxycodone with no significant relief of pain. Pt's husband states that he was unhappy with the care they received at [facility]. Pt was given Phenergan, Robaxin, and Decadron at [place] today at [facility]. Husband states that she is normally given 2mg  Ativan and Dilaudid during previous visits which helps her pain. Pt returned home and husband called 911 again to be brought here. Pt was receiving steroid injections at [facility] for her chronic back pain but states that they have not been helping her pain recently. She has a follow up scheduled for a consult with a new doctor in 3 days. Currently pt complaining of severe, left sided lower back pain, radiating down her left leg. She describes the pain as stabbing. Pt is able to move her bilateral LE but is unable to ambulate due to pain. No numbness or tingling in extremities.   The history is provided by the patient. No language interpreter was used.    Past Medical History:  Diagnosis Date  . ABDOMINAL WALL HERNIA 02/27/2010  . Abnormal Pap smear   . ALLERGIC RHINITIS 06/02/2007  . ANOREXIA, CHRONIC 08/07/2008  . ANXIETY 09/15/2010  . BACK PAIN, THORACIC REGION 07/07/2007  . Bipolar disorder ([place])   . CERVICAL RADICULOPATHY 12/11/2008  . Condyloma acuminatum 04/23/2009  . CONSTIPATION 09/15/2010  . DYSPHAGIA UNSPECIFIED 09/09/2009  .  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 ENDOMETRIOSIS 12/16/2009  . FATIGUE 06/11/2010  . Fibromyalgia   . GERD 10/23/2009  . Heart palpitations   . HERPES SIMPLEX INFECTION 06/11/2010  . LENTIGO 04/23/2009  . LUNG NODULE 06/17/2010  . Narcotic abuse, continuous   . Ovarian cyst   . Palpitations 10/23/2009  . Panic disorder   . Rheumatoid arthritis(714.0)   . Stricture and stenosis of esophagus 05/13/2010  . TOBACCO ABUSE 08/07/2008  . Urinary tract infection     Patient Active Problem List    Diagnosis Date Noted  . Bipolar 1 disorder, mixed, moderate (HCC) 07/15/2015    Class: Chronic  . 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 Sciatica 01/09/2015  . Reactive hypoglycemia 12/17/2013  . Musculoskeletal malfunction arising from mental factors 04/04/2013  . Incisional hernia 11/15/2012  . Other postprocedural status(V45.89) 11/15/2012  . Abdominal pain 11/11/2012  . Chronic pain syndrome 11/11/2012  . Disorder of sacrum 11/11/2012  . Heartburn 10/04/2011  . DYSPHAGIA 10/04/2011  . Depression with anxiety 09/15/2010  . CONSTIPATION 09/15/2010  . LUNG NODULE 06/17/2010  . HERPES SIMPLEX INFECTION 06/11/2010  . FATIGUE 06/11/2010  . STRICTURE AND STENOSIS OF ESOPHAGUS 05/13/2010  . ABDOMINAL WALL HERNIA 02/27/2010  . ENDOMETRIOSIS 12/16/2009  . GERD 10/23/2009  . PALPITATIONS 10/23/2009  . 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 CONDYLOMA ACUMINATUM 04/23/2009  . LENTIGO 04/23/2009  . DENTAL PAIN 03/19/2009  . CERVICAL RADICULOPATHY 12/11/2008  . 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 TOBACCO ABUSE 08/07/2008  . BACK PAIN, THORACIC REGION 07/07/2007  . ALLERGIC RHINITIS 06/02/2007    Past Surgical History:  Procedure Laterality Date  . ABDOMINAL SURGERY    . ABDOMINAL WALL MESH  REMOVAL    . CESAREAN SECTION     x2   . DILATION AND CURETTAGE OF UTERUS     x1   . ENDOMETRIAL ABLATION    . esophageal dilatation x 4    . GUM SURGERY    . HERNIA REPAIR     X3  . PARTIAL HYSTERECTOMY      OB History    Gravida Para Term Preterm AB Living   3 2 2  0 1 2   SAB TAB Ectopic Multiple Live Births   1 0 0 0 1       Home Medications    Prior to Admission medications   Medication Sig Start Date End Date Taking? Authorizing Provider  acetaminophen (TYLENOL) 325 MG tablet Take 325 mg by mouth every 6 (six) hours as needed for mild pain.    Yes [provider]  Brexpiprazole (REXULTI) 1 MG  TABS Take 1 tablet by mouth every morning.   Yes [provider]  clonazePAM (KLONOPIN) 1 MG tablet Take 1 mg by mouth 2 (two) times daily as needed for anxiety.   Yes [provider]  escitalopram (LEXAPRO) 20 MG tablet Take 30 mg by mouth at bedtime.   Yes [provider]  Iloperidone (FANAPT) 1 MG TABS Take 1 tablet by mouth 2 (two) times daily.    Yes [provider]  lurasidone (LATUDA) 80 MG TABS tablet Take 80 mg by mouth at bedtime.    Yes [provider]  methocarbamol (ROBAXIN) 750 MG tablet Take 750 mg by mouth every 8 (eight) hours as needed for muscle spasms.   Yes [provider]  oxyCODONE-acetaminophen (PERCOCET) 7.5-325 MG tablet Take 1 tablet by mouth 4 (four) times daily.  Yes [provider]  predniSONE (STERAPRED UNI-PAK 21 TAB) 10 MG (21) TBPK tablet Take by mouth daily. Take 6 tabs by mouth daily  for 2 days, then 5 tabs for 2 days, then 4 tabs for 2 days, then 3 tabs for 2 days, 2 tabs for 2 days, then 1 tab by mouth daily for 2 days 04/04/17  Yes Lacretia Leigh, MD  promethazine (PHENERGAN) 25 MG suppository Place 1 suppository (25 mg total) rectally every 6 (six) hours as needed for nausea or vomiting. 04/09/17  Yes Street, [place], PA-C    Family History Family History  Problem Relation Age of Onset  . Depression Mother   . Arthritis Mother   . Hyperlipidemia Father   . Hypertension Father     Social History Social History  Substance Use Topics  . Smoking status: Current Every Day Smoker    Packs/day: 1.00    Types: Cigarettes  . Smokeless tobacco: Never Used  . Alcohol use No     Allergies   Lamictal [lamotrigine]; Morphine and related; Nicotine; Zofran; Chantix [varenicline tartrate]; Codeine; Haldol [haloperidol]; Ibuprofen; Lidoderm [lidocaine]; Metaxalone; Sulfa antibiotics; Toradol [ketorolac tromethamine]; Amoxicillin; and Penicillins   Review of Systems Review of Systems    Musculoskeletal: Positive for back pain (lower (left)) and gait problem (due to pain).  Neurological: Negative for weakness and numbness.     Physical Exam Updated Vital Signs BP (!) 90/51 Comment: RN notified  Pulse 65   Temp 98.6 F (37 C) (Oral)   Resp 16   Ht 5\' 7"  (1.702 m)   Wt 62.1 kg (137 lb)   SpO2 98%   BMI 21.46 kg/m   Physical Exam  Constitutional: She is oriented to person, place, and time. She appears well-developed and well-nourished.  Upon initial assesemnt patient curled itno ball but able to move both lower extremities and has sensation and strength intact.  HENT:  Head: Normocephalic and atraumatic.  Eyes: Right eye exhibits no discharge.  Cardiovascular: Normal rate, regular rhythm and normal heart sounds.   No murmur heard. Pulmonary/Chest: Effort normal and breath sounds normal. She has no wheezes. She has no rales.  Abdominal: Soft. She exhibits no distension. There is no tenderness.  Neurological: She is oriented to person, place, and time.  Sensation intact in saddle area. Movign lower legs with good sthrenth. No signs of infection.   Skin: Skin is warm and dry. She is not diaphoretic.  Psychiatric: She has a normal mood and affect.  Nursing note and vitals reviewed.    ED Treatments / Results  DIAGNOSTIC STUDIES: Oxygen Saturation is 98% on RA, normal by my interpretation.  COORDINATION OF CARE: 1:32 AM-Discussed treatment plan with pt at bedside and pt agreed to plan.   Labs (all labs ordered are listed, but only abnormal results are displayed) Labs Reviewed - No data to display  EKG  EKG Interpretation None       Radiology Dg Lumbar Spine Complete  Result Date: 04/09/2017 CLINICAL DATA:  Low back pain and left sciatica. EXAM: LUMBAR SPINE - COMPLETE 4+ VIEW COMPARISON:  Lumbar spine MR dated 04/14/2016. FINDINGS: Five non-rib-bearing lumbar vertebrae. Mild dextroconvex lumbar rotary scoliosis. Minimal anterior spur formation at  multiple levels. No fractures, pars defects or subluxations. IMPRESSION: Minimal degenerative changes.  No acute abnormality. Electronically Signed   By: Claudie Revering M.D.   On: 04/09/2017 19:19    Procedures Procedures (including critical care time)  Medications Ordered in ED Medications  promethazine (PHENERGAN) injection  12.5 mg (12.5 mg Intravenous Given 04/10/17 0136)  LORazepam (ATIVAN) injection 1 mg (1 mg Intravenous Given 04/10/17 0136)  HYDROmorphone (DILAUDID) injection 1 mg (1 mg Intravenous Given 04/10/17 0136)     Initial Impression / Assessment and Plan / ED Course  I have reviewed the triage vital signs and the nursing notes.  Pertinent labs & imaging results that were available during my care of the patient were reviewed by me and considered in my medical decision making (see chart for details).    I personally performed the services described in this documentation, which was scribed in my presence. The recorded information has been reviewed and is accurate.   Patient is a 41 year old female presenting to sciatic pain. She states " this is myy usual sciatic pain". No red flags. No fevers no incontinence or weakness. Patient was discharged from [facility] within the last hour. I do believe that this requires further workup given the lack of red flags. Patient has good follow-up with multiple providers that she seen in the past with sciatica. Specifically patient husband asks for no imaging because she's had significant amounts radiation in the past with imaging.  Patient asking for IV pain medication. We will give one dose of IV pain medication to get her pain under control so she can tolerated at home.  Long discussion had with patient and patient's family about the risk of addiction. However I think given this is the second visit within 1 day. Will try IV medication to get under control.  3:35 AM  1 dose of IV pain medication Patient's pain under control and she is now  ready to go home.     Final Clinical Impressions(s) / ED Diagnoses   Final diagnoses:  Sciatica of left side    New Prescriptions New Prescriptions   No medications on file     Macarthur Critchley, MD 04/10/17 0335    Macarthur Critchley, MD 04/22/17 1538

## 2017-04-10 NOTE — ED Triage Notes (Signed)
Pt brought to ED by [facility] from home for 10/10 sciatica pain, per EMS pt was lying on the floor unable to move or ambulate on her own, pt was seen today on [facility] and sent home. BP 85/50 EMS unable to give nothing for pain. 400 ml NS given.

## 2017-04-15 ENCOUNTER — Emergency Department (HOSPITAL_COMMUNITY)
Admission: EM | Admit: 2017-04-15 | Discharge: 2017-04-16 | Disposition: A | Discharge status: Home / Self Care | Payer: Medicaid Other | Attending: Emergency Medicine | Admitting: Emergency Medicine

## 2017-04-15 ENCOUNTER — Encounter (HOSPITAL_COMMUNITY): Payer: Self-pay | Admitting: Pharmacy Technician

## 2017-04-15 DIAGNOSIS — G8929 Other chronic pain: Secondary | ICD-10-CM

## 2017-04-15 DIAGNOSIS — F17210 Nicotine dependence, cigarettes, uncomplicated: Secondary | ICD-10-CM | POA: Insufficient documentation

## 2017-04-15 DIAGNOSIS — Z79899 Other long term (current) drug therapy: Secondary | ICD-10-CM | POA: Insufficient documentation

## 2017-04-15 DIAGNOSIS — M545 Low back pain: Secondary | ICD-10-CM | POA: Diagnosis present

## 2017-04-15 DIAGNOSIS — M5442 Lumbago with sciatica, left side: Secondary | ICD-10-CM | POA: Diagnosis not present

## 2017-04-15 MED ORDER — KETOROLAC TROMETHAMINE 30 MG/ML IJ SOLN
30.0000 mg | Freq: Once | INTRAMUSCULAR | Status: AC
  Administered 2017-04-15: 30 mg via INTRAVENOUS
  Filled 2017-04-15: qty 1

## 2017-04-15 MED ORDER — METHOCARBAMOL 1000 MG/10ML IJ SOLN
500.0000 mg | Freq: Once | INTRAVENOUS | Status: AC
  Administered 2017-04-15: 500 mg via INTRAVENOUS
  Filled 2017-04-15: qty 5

## 2017-04-15 MED ORDER — LIDOCAINE 5 % EX PTCH
1.0000 | MEDICATED_PATCH | Freq: Every day | CUTANEOUS | Status: DC
  Administered 2017-04-15: 1 via TRANSDERMAL
  Filled 2017-04-15: qty 1

## 2017-04-15 MED ORDER — PROMETHAZINE HCL 25 MG PO TABS
25.0000 mg | ORAL_TABLET | Freq: Once | ORAL | Status: AC
  Administered 2017-04-15: 25 mg via ORAL
  Filled 2017-04-15: qty 1

## 2017-04-15 MED ORDER — DEXAMETHASONE SODIUM PHOSPHATE 10 MG/ML IJ SOLN
10.0000 mg | Freq: Once | INTRAMUSCULAR | Status: AC
  Administered 2017-04-15: 10 mg via INTRAVENOUS
  Filled 2017-04-15: qty 1

## 2017-04-15 NOTE — Discharge Instructions (Signed)
We recommend use of over-the-counter Salonpas. Continue with your prescribed medications. Follow up with your primary care doctor and neurologist as scheduled.

## 2017-04-15 NOTE — ED Notes (Signed)
EDP at bedside  

## 2017-04-15 NOTE — ED Provider Notes (Signed)
[facility] DEPT Provider Note   CSN: [id number] Arrival date & time: 04/15/17  2035    History   Chief Complaint Chief Complaint  Patient presents with  . Back Pain    HPI Gina [name] is a 41 y.o. female.  41 year old female with a history of sciatica, chronic pain syndrome, depression, and anxiety presents to the emergency department for evaluation of low back pain. She reports a pressure-like pain in her low back radiates down her left leg. This is consistent with her prior sciatica. She has noted exacerbation of her sciatica symptoms over the past 2 weeks. The patient has been seen 4 times previously in the last 10 days for similar complaints. She presents requesting IV Dilaudid and IV Ativan. She is followed by a pain clinic and is prescribed 7.5-325 mg Percocet tablets. She states that this has not been helping her symptoms. She saw a neurologist today hoping to receive a cortisone injection, but was told that the office did not perform this procedure. She was transferred to [facility] for pain control or she was offered Robaxin and refused; previously given Toradol. Patient denies any changes to her symptoms since her visit to [facility] earlier today; no incontinence, sensation changes, fever. No new trauma, injury, or fall since previous evaluation.    The history is provided by the patient. No language interpreter was used.  Back Pain      Past Medical History:  Diagnosis Date  . ABDOMINAL WALL HERNIA 02/27/2010  . Abnormal Pap smear   . ALLERGIC RHINITIS 06/02/2007  . ANOREXIA, CHRONIC 08/07/2008  . ANXIETY 09/15/2010  . BACK PAIN, THORACIC REGION 07/07/2007  . Bipolar disorder ([place])   . CERVICAL RADICULOPATHY 12/11/2008  . Condyloma acuminatum 04/23/2009  . CONSTIPATION 09/15/2010  . DYSPHAGIA UNSPECIFIED 09/09/2009  . 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 ENDOMETRIOSIS 12/16/2009  . FATIGUE 06/11/2010  . Fibromyalgia   . GERD 10/23/2009  . Heart palpitations   . HERPES  SIMPLEX INFECTION 06/11/2010  . LENTIGO 04/23/2009  . LUNG NODULE 06/17/2010  . Narcotic abuse, continuous   . Ovarian cyst   . Palpitations 10/23/2009  . Panic disorder   . Rheumatoid arthritis(714.0)   . Stricture and stenosis of esophagus 05/13/2010  . TOBACCO ABUSE 08/07/2008  . Urinary tract infection     Patient Active Problem List   Diagnosis Date Noted  . Bipolar 1 disorder, mixed, moderate (HCC) 07/15/2015    Class: Chronic  . 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 Sciatica 01/09/2015  . Reactive hypoglycemia 12/17/2013  . Musculoskeletal malfunction arising from mental factors 04/04/2013  . Incisional hernia 11/15/2012  . Other postprocedural status(V45.89) 11/15/2012  . Abdominal pain 11/11/2012  . Chronic pain syndrome 11/11/2012  . Disorder of sacrum 11/11/2012  . Heartburn 10/04/2011  . DYSPHAGIA 10/04/2011  . Depression with anxiety 09/15/2010  . CONSTIPATION 09/15/2010  . LUNG NODULE 06/17/2010  . HERPES SIMPLEX INFECTION 06/11/2010  . FATIGUE 06/11/2010  . STRICTURE AND STENOSIS OF ESOPHAGUS 05/13/2010  . ABDOMINAL WALL HERNIA 02/27/2010  . ENDOMETRIOSIS 12/16/2009  . GERD 10/23/2009  . PALPITATIONS 10/23/2009  . 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 CONDYLOMA ACUMINATUM 04/23/2009  . LENTIGO 04/23/2009  . DENTAL PAIN 03/19/2009  . CERVICAL RADICULOPATHY 12/11/2008  . 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 TOBACCO ABUSE 08/07/2008  . BACK PAIN, THORACIC REGION 07/07/2007  . ALLERGIC RHINITIS 06/02/2007    Past Surgical History:  Procedure Laterality Date  .  ABDOMINAL SURGERY    . ABDOMINAL WALL MESH  REMOVAL    . CESAREAN SECTION     x2   . DILATION AND CURETTAGE OF UTERUS     x1   . ENDOMETRIAL ABLATION    . esophageal dilatation x 4    . GUM SURGERY    . HERNIA REPAIR     X3  . PARTIAL HYSTERECTOMY      OB History    Gravida Para Term Preterm AB Living   3 2 2  0 1 2    SAB TAB Ectopic Multiple Live Births   1 0 0 0 1       Home Medications    Prior to Admission medications   Medication Sig Start Date End Date Taking? Authorizing Provider  Brexpiprazole (REXULTI) 1 MG TABS Take 1 tablet by mouth every morning.   Yes [provider]  clonazePAM (KLONOPIN) 1 MG tablet Take 1 mg by mouth 2 (two) times daily as needed for anxiety.   Yes [provider]  escitalopram (LEXAPRO) 20 MG tablet Take 30 mg by mouth at bedtime.   Yes [provider]  Iloperidone (FANAPT) 1 MG TABS Take 1 tablet by mouth 2 (two) times daily.    Yes [provider]  lurasidone (LATUDA) 80 MG TABS tablet Take 80 mg by mouth at bedtime.    Yes [provider]  methocarbamol (ROBAXIN) 750 MG tablet Take 750 mg by mouth every 8 (eight) hours as needed for muscle spasms.   Yes [provider]  oxyCODONE-acetaminophen (PERCOCET) 7.5-325 MG tablet Take 1 tablet by mouth 4 (four) times daily.   Yes [provider]  predniSONE (STERAPRED UNI-PAK 21 TAB) 10 MG (21) TBPK tablet Take by mouth daily. Take 6 tabs by mouth daily  for 2 days, then 5 tabs for 2 days, then 4 tabs for 2 days, then 3 tabs for 2 days, 2 tabs for 2 days, then 1 tab by mouth daily for 2 days Patient not taking: Reported on 04/15/2017 04/04/17   Lacretia Leigh, MD  promethazine (PHENERGAN) 25 MG suppository Place 1 suppository (25 mg total) rectally every 6 (six) hours as needed for nausea or vomiting. Patient not taking: Reported on 04/15/2017 04/09/17   Street, [place], PA-C    Family History Family History  Problem Relation Age of Onset  . Depression Mother   . Arthritis Mother   . Hyperlipidemia Father   . Hypertension Father     Social History Social History  Substance Use Topics  . Smoking status: Current Every Day Smoker    Packs/day: 1.00    Types: Cigarettes  . Smokeless tobacco: Never Used  . Alcohol use No     Allergies   Lamictal  [lamotrigine]; Morphine and related; Nicotine; Zofran; Chantix [varenicline tartrate]; Codeine; Haldol [haloperidol]; Ibuprofen; Lidoderm [lidocaine]; Metaxalone; Sulfa antibiotics; Toradol [ketorolac tromethamine]; Amoxicillin; and Penicillins   Review of Systems Review of Systems  Musculoskeletal: Positive for back pain.  Ten systems reviewed and are negative for acute change, except as noted in the HPI.    Physical Exam Updated Vital Signs BP 99/63   Pulse 70   Temp 98 F (36.7 C) (Oral)   Resp 15   Ht 5\' 7"  (1.702 m)   Wt 62.1 kg (137 lb)   SpO2 98%   BMI 21.46 kg/m   Physical Exam  Constitutional: She is oriented to person, place, and time. She appears well-developed and well-nourished. No distress.  Patient  tearful, crying out in discomfort intermittently.  HENT:  Head: Normocephalic and atraumatic.  Eyes: Conjunctivae and EOM are normal. No scleral icterus.  Neck: Normal range of motion.  Cardiovascular: Normal rate, regular rhythm and intact distal pulses.   DP pulse 2+ bilaterally  Pulmonary/Chest: Effort normal. No respiratory distress.  Respirations even and unlabored  Musculoskeletal: Normal range of motion.  Diffusely tender to lumbosacral paraspinal muscles; L>R. No bony deformities, step offs, or crepitus. No appreciable spasm.  Neurological: She is alert and oriented to person, place, and time. She exhibits normal muscle tone.  Sensation to light touch intact in BLE  Skin: Skin is warm and dry. No rash noted. She is not diaphoretic. No erythema. No pallor.  Psychiatric: She has a normal mood and affect. Her behavior is normal.  Nursing note and vitals reviewed.    ED Treatments / Results  Labs (all labs ordered are listed, but only abnormal results are displayed) Labs Reviewed - No data to display  EKG  EKG Interpretation None       Radiology No results found.  Procedures Procedures (including critical care time)  Medications Ordered in  ED Medications  methocarbamol (ROBAXIN) 500 mg in dextrose 5 % 50 mL IVPB (500 mg Intravenous New Bag/Given 04/15/17 2302)  lidocaine (LIDODERM) 5 % 1 patch (1 patch Transdermal Patch Applied 04/15/17 2303)  ketorolac (TORADOL) 30 MG/ML injection 30 mg (30 mg Intravenous Given 04/15/17 2229)  dexamethasone (DECADRON) injection 10 mg (10 mg Intravenous Given 04/15/17 2229)  promethazine (PHENERGAN) tablet 25 mg (25 mg Oral Given 04/15/17 2229)     Initial Impression / Assessment and Plan / ED Course  I have reviewed the triage vital signs and the nursing notes.  Pertinent labs & imaging results that were available during my care of the patient were reviewed by me and considered in my medical decision making (see chart for details).     9:08 PM I have seen and evaluated the patient. No red flags or signs concerning for cauda equina. She continues to complain of worsening sciatica. She is requesting IV Dilaudid and IV Ativan. I have explained to the patient that we will not be providing IV narcotics for management of her exacerbated chronic pain. Patient requesting to speak with M.D. regarding her concerns and needs for pain medication. Will consult with Dr. Leonette Monarch.   Per Care Everywhere, patient seen at [facility] today for complaints of chronic back pain. No narcotics were given at this ED visit this afternoon.  10:20 PM Dr. Leonette Monarch has attempted supportive measures with patient at bedside. She is now requesting medications. Non-narcotic options ordered.  11:25 PM Medications provided. Patient tolerated medications well. Plan for discharge with instructions for outpatient follow up. VSS.   Final Clinical Impressions(s) / ED Diagnoses   Final diagnoses:  Chronic low back pain with left-sided sciatica, unspecified back pain laterality    New Prescriptions New Prescriptions   No medications on file     Antonietta Breach, Hershal Coria 04/15/17 2327

## 2017-04-15 NOTE — ED Provider Notes (Signed)
Medical screening examination/treatment/procedure(s) were conducted as a shared visit with non-physician practitioner(s) and myself.  I personally evaluated the patient during the encounter. Briefly, the patient is a 41 y.o. female with a history of sciatica presents to the ED with left lower back pain consistent with her sciatic pain. No red flags concerning for cauda equina. Treated symptomatically with nonnarcotic medication. Patient already has an appointment with her pain management team for procedural intervention.  The patient is safe for discharge with strict return precautions.     EKG Interpretation None           Shalissa Easterwood, Grayce Sessions, MD 04/15/17 2315

## 2017-04-15 NOTE — ED Triage Notes (Signed)
Pt presents to the ED via EMS with reports of back pain/sciatica worsening over the last week. Pt reports going to a new neurologist to get injections, but was informed today that he does not perform those. Pt denies bowel/bladder incontinence. Pt takes percocet at home for pain control without relief. Pt given toradol at MD office earlier today without relief. EMS reports BP 90/50. Other VSS with EMS. Pt reports being unable to walk.

## 2017-04-15 NOTE — ED Notes (Signed)
Gave patient peanut butter, graham crackers, and cup of water.

## 2017-05-01 ENCOUNTER — Encounter (HOSPITAL_COMMUNITY): Payer: Self-pay | Admitting: Emergency Medicine

## 2017-05-01 ENCOUNTER — Emergency Department (HOSPITAL_COMMUNITY)
Admission: EM | Admit: 2017-05-01 | Discharge: 2017-05-01 | Disposition: A | Discharge status: Home / Self Care | Payer: Medicaid Other | Attending: Emergency Medicine | Admitting: Emergency Medicine

## 2017-05-01 DIAGNOSIS — G8929 Other chronic pain: Secondary | ICD-10-CM | POA: Insufficient documentation

## 2017-05-01 DIAGNOSIS — F17210 Nicotine dependence, cigarettes, uncomplicated: Secondary | ICD-10-CM | POA: Diagnosis not present

## 2017-05-01 DIAGNOSIS — R110 Nausea: Secondary | ICD-10-CM | POA: Insufficient documentation

## 2017-05-01 DIAGNOSIS — M5442 Lumbago with sciatica, left side: Secondary | ICD-10-CM | POA: Diagnosis present

## 2017-05-01 MED ORDER — KETOROLAC TROMETHAMINE 30 MG/ML IJ SOLN
30.0000 mg | Freq: Once | INTRAMUSCULAR | Status: AC
  Administered 2017-05-01: 30 mg via INTRAVENOUS
  Filled 2017-05-01: qty 1

## 2017-05-01 MED ORDER — PROMETHAZINE HCL 25 MG/ML IJ SOLN
25.0000 mg | Freq: Once | INTRAMUSCULAR | Status: AC
  Administered 2017-05-01: 25 mg via INTRAVENOUS
  Filled 2017-05-01: qty 1

## 2017-05-01 MED ORDER — DEXAMETHASONE SODIUM PHOSPHATE 10 MG/ML IJ SOLN
10.0000 mg | Freq: Once | INTRAMUSCULAR | Status: AC
  Administered 2017-05-01: 10 mg via INTRAVENOUS
  Filled 2017-05-01: qty 1

## 2017-05-01 MED ORDER — MELOXICAM 15 MG PO TABS: 15.0000 mg | ORAL_TABLET | Freq: Every day | ORAL | 0 refills | Status: AC

## 2017-05-01 MED ORDER — METHOCARBAMOL 1000 MG/10ML IJ SOLN
500.0000 mg | Freq: Once | INTRAVENOUS | Status: AC
  Administered 2017-05-01: 500 mg via INTRAVENOUS
  Filled 2017-05-01: qty 550

## 2017-05-01 NOTE — ED Notes (Signed)
Patient continually yelling out in pain in hallway.

## 2017-05-01 NOTE — ED Triage Notes (Signed)
Pt from home via EMS,  c/o spasms and sciatica with hx of the same. Pt spouse advises that the only thing that helps is "ativan and dilaudid". Pt arrives yelling loudly and is only comfortable on her side. Pt has neurology appt to receive injections for her back. Pt in NAD

## 2017-05-01 NOTE — Discharge Instructions (Signed)
Start taking Bobe[place] daily with food. If you have any upset stomach issues with this medication, stopped taking it. Apply ice packs or heat packs to the affected area for comfort. Continue taking your medications as prescribed. Follow-up with your neurologist in 2 days as scheduled for management of your back pain. Return to the ED if any concerning signs or symptoms develop.

## 2017-05-01 NOTE — ED Provider Notes (Signed)
[facility] DEPT Provider Note   CSN: [id number] Arrival date & time: 05/01/17  1333     History   Chief Complaint Chief Complaint  Patient presents with  . Back Pain    HPI Gina [name] is a 41 y.o. female with history of chronic low back and neck pain, fibromyalgia, narcotic abuse, anxiety and panic disorder who presents with acute worsening of chronic low back pain which began earlier today. Has been having back pain for several weeks, for which she has a neurologist and a pain management physician, and states that any form of activity worsens her pain.She states that she was sitting in church when her ongoing back pain worsened. Pain is on the left radiating down the left leg and is described as a sharp stabbing sensation with intermittent tingling. Pain is worsened with sitting, standing, walking. Somewhat alleviated by laying on her right side. She takes 4 tablets 7.5-325 mg Percocet as well as Robaxin daily. She states these have not been helpful to her today. She  denies trauma, falls, fever, chills, history of IV drug use or cancer, saddle anesthesia, or bowel or bladder incontinence. She is able to walk, but states is extremely painful. States this feels like her typical sciatica pain. She has had 6 ED visits in the past month for the same. She has an ESI with her neurologist scheduled on Tuesday. She is requesting IV narcotics and benzodiazepines. Endorses nausea with her pain, but denies neck pain, chest pain, shortness of breath, abdominal pain, vomiting, diarrhea, dysuria, hematuria.  The history is provided by the patient.    Past Medical History:  Diagnosis Date  . ABDOMINAL WALL HERNIA 02/27/2010  . Abnormal Pap smear   . ALLERGIC RHINITIS 06/02/2007  . ANOREXIA, CHRONIC 08/07/2008  . ANXIETY 09/15/2010  . BACK PAIN, THORACIC REGION 07/07/2007  . Bipolar disorder ([place])   . CERVICAL RADICULOPATHY 12/11/2008  . Condyloma acuminatum 04/23/2009  . CONSTIPATION 09/15/2010  .  DYSPHAGIA UNSPECIFIED 09/09/2009  . 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 ENDOMETRIOSIS 12/16/2009  . FATIGUE 06/11/2010  . Fibromyalgia   . GERD 10/23/2009  . Heart palpitations   . HERPES SIMPLEX INFECTION 06/11/2010  . LENTIGO 04/23/2009  . LUNG NODULE 06/17/2010  . Narcotic abuse, continuous   . Ovarian cyst   . Palpitations 10/23/2009  . Panic disorder   . Rheumatoid arthritis(714.0)   . Stricture and stenosis of esophagus 05/13/2010  . TOBACCO ABUSE 08/07/2008  . Urinary tract infection     Patient Active Problem List   Diagnosis Date Noted  . Bipolar 1 disorder, mixed, moderate (HCC) 07/15/2015    Class: Chronic  . 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 Sciatica 01/09/2015  . Reactive hypoglycemia 12/17/2013  . Musculoskeletal malfunction arising from mental factors 04/04/2013  . Incisional hernia 11/15/2012  . Other postprocedural status(V45.89) 11/15/2012  . Abdominal pain 11/11/2012  . Chronic pain syndrome 11/11/2012  . Disorder of sacrum 11/11/2012  . Heartburn 10/04/2011  . DYSPHAGIA 10/04/2011  . Depression with anxiety 09/15/2010  . CONSTIPATION 09/15/2010  . LUNG NODULE 06/17/2010  . HERPES SIMPLEX INFECTION 06/11/2010  . FATIGUE 06/11/2010  . STRICTURE AND STENOSIS OF ESOPHAGUS 05/13/2010  . ABDOMINAL WALL HERNIA 02/27/2010  . ENDOMETRIOSIS 12/16/2009  . GERD 10/23/2009  . PALPITATIONS 10/23/2009  . 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 CONDYLOMA ACUMINATUM 04/23/2009  . LENTIGO 04/23/2009  . DENTAL PAIN 03/19/2009  . CERVICAL RADICULOPATHY 12/11/2008  . PANIC DISORDER  WITHOUT AGORAPHOBIA 08/07/2008  . TOBACCO ABUSE 08/07/2008  . BACK PAIN, THORACIC REGION 07/07/2007  . ALLERGIC RHINITIS 06/02/2007    Past Surgical History:  Procedure Laterality Date  . ABDOMINAL SURGERY    . ABDOMINAL WALL MESH  REMOVAL    . CESAREAN SECTION     x2   . DILATION AND CURETTAGE OF UTERUS     x1   . ENDOMETRIAL  ABLATION    . esophageal dilatation x 4    . GUM SURGERY    . HERNIA REPAIR     X3  . PARTIAL HYSTERECTOMY      OB History    Gravida Para Term Preterm AB Living   3 2 2  0 1 2   SAB TAB Ectopic Multiple Live Births   1 0 0 0 1       Home Medications    Prior to Admission medications   Medication Sig Start Date End Date Taking? Authorizing Provider  Brexpiprazole (REXULTI) 1 MG TABS Take 1 tablet by mouth every morning.   Yes [provider]  clonazePAM (KLONOPIN) 1 MG tablet Take 1 mg by mouth 2 (two) times daily as needed for anxiety.   Yes [provider]  escitalopram (LEXAPRO) 20 MG tablet Take 30 mg by mouth at bedtime.   Yes [provider]  Iloperidone (FANAPT) 1 MG TABS Take 1 tablet by mouth 2 (two) times daily.    Yes [provider]  lurasidone (LATUDA) 80 MG TABS tablet Take 80 mg by mouth at bedtime.    Yes [provider]  methocarbamol (ROBAXIN) 750 MG tablet Take 750 mg by mouth every 8 (eight) hours as needed for muscle spasms.   Yes [provider]  oxyCODONE-acetaminophen (PERCOCET) 7.5-325 MG tablet Take 1 tablet by mouth 4 (four) times daily.   Yes [provider]  meloxicam (MOBIC) 15 MG tablet Take 1 tablet (15 mg total) by mouth daily. 05/01/17 05/08/17  Renita Papa, PA-C    Family History Family History  Problem Relation Age of Onset  . Depression Mother   . Arthritis Mother   . Hyperlipidemia Father   . Hypertension Father     Social History Social History  Substance Use Topics  . Smoking status: Current Every Day Smoker    Packs/day: 1.00    Types: Cigarettes  . Smokeless tobacco: Never Used  . Alcohol use No     Allergies   Lamictal [lamotrigine]; Morphine and related; Nicotine; Zofran; Chantix [varenicline tartrate]; Codeine; Haldol [haloperidol]; Ibuprofen; Lidoderm [lidocaine]; Metaxalone; Sulfa antibiotics; Toradol [ketorolac tromethamine]; Amoxicillin; and  Penicillins   Review of Systems Review of Systems  Constitutional: Negative for chills and fever.  Respiratory: Negative for shortness of breath.   Cardiovascular: Negative for chest pain.  Gastrointestinal: Positive for nausea. Negative for abdominal pain, diarrhea and vomiting.  Genitourinary: Negative for dysuria and hematuria.  Musculoskeletal: Positive for back pain. Negative for neck pain.  Neurological: Negative for syncope, weakness and numbness.       Tingling of LLE     Physical Exam Updated Vital Signs BP (!) 102/56 (BP Location: Right Arm)   Pulse 68   Temp 98.2 F (36.8 C) (Oral)   Resp 18   SpO2 100%   Physical Exam  Constitutional: She is oriented to person, place, and time. She appears well-developed and well-nourished. No distress.  Laying on right side in fetal position, cries out in pain occasionally  HENT:  Head: Normocephalic and atraumatic.  Eyes: Conjunctivae and EOM are normal. Pupils are equal, round, and reactive to light. Right eye exhibits no discharge. Left eye exhibits no discharge.  Neck: Normal range of motion. Neck supple. No JVD present. No tracheal deviation present.  No midline spine TTP, no paraspinal muscle tenderness. No deformity, crepitus, or step-off noted  Cardiovascular: Normal rate, regular rhythm, normal heart sounds and intact distal pulses.   2+ radial and DP/PT pulses bl, negative Homan's bl. No swelling of the lower extremities, erythema, or palpable cords   Pulmonary/Chest: Effort normal and breath sounds normal. No respiratory distress. She exhibits no tenderness.  Abdominal: Soft. She exhibits no distension. There is no tenderness.  Musculoskeletal: She exhibits tenderness. She exhibits no edema.  No midline spine TTP. Left paraspinal muscle tenderness in the lumbar region, with left-sided SI joint tenderness. generalized TTP of left lower extremity posteriorly. positive straight leg raise on the left. No appreciable muscle  spasm. No deformity, crepitus, or step-off noted. 5/5 strength of BLE major muscle groups, however with generalized pain on movement of the left lower extremity    Lymphadenopathy:    She has no cervical adenopathy.  Neurological: She is alert and oriented to person, place, and time. No cranial nerve deficit or sensory deficit.  Cranial nerves III through XII tested and intact. Fluent speech, no facial droop, unable to assess gait due to patient refusal secondary to pain  Skin: Skin is dry. She is not diaphoretic.  Psychiatric: She has a normal mood and affect. Her behavior is normal.     ED Treatments / Results  Labs (all labs ordered are listed, but only abnormal results are displayed) Labs Reviewed - No data to display  EKG  EKG Interpretation None       Radiology No results found.  Procedures Procedures (including critical care time)  Medications Ordered in ED Medications  ketorolac (TORADOL) 30 MG/ML injection 30 mg (30 mg Intravenous Given 05/01/17 1457)  promethazine (PHENERGAN) injection 25 mg (25 mg Intravenous Given 05/01/17 1457)  dexamethasone (DECADRON) injection 10 mg (10 mg Intravenous Given 05/01/17 1457)  methocarbamol (ROBAXIN) 500 mg in dextrose 5 % 50 mL IVPB (0 mg Intravenous Stopped 05/01/17 1733)     Initial Impression / Assessment and Plan / ED Course  I have reviewed the triage vital signs and the nursing notes.  Pertinent labs & imaging results that were available during my care of the patient were reviewed by me and considered in my medical decision making (see chart for details).     Patient with ongoing chronic back pain, acutely worsened while sitting in church today. No trauma, no red flags concerning for CES. No focal neuro deficits. Afebrile, vital signs are at patient's baseline. She has an ESI scheduled for 2 days from now. Given toradol, phenergan, and decadron IV which has been helpful to her in the past.  4:08 PM On reevaluation, patient  states that her pain is only mildly improved. She continues to refuse to walk. Had a long discussion with patient and patient's husband about the risk versus benefit ratio of IV narcotics and benzodiazepines for pain control for chronic pain. Patient has been said that she had relief in the past with IV Robaxin in the ED, and is requesting this at this time. 5:39 PM On reevaluation, patient is resting comfortably, sitting upright, tolerating PO food and drink. She was able to move from the bed to a wheelchair in order to move to the restroom without difficulty. She states  that she is still drinking pain, but it has significantly improved. Will discharge with motorbike, because Naprosyn has caused stomach upset in the past. Recommend taking a PPI with this medication. She will follow-up with her neurologist as scheduled in 2 days for her ESI. Had an extensive discussion of nonpharmacologic treatments for her back pain, including physical therapy, dry needling, ice and heat, and RFA. Emphasized to patient the importance of continuing with her neurologist and pain management physician for management of her chronic pain. Discussed indications for return to the ED immediately. Patient and patient's husband verbalized understanding of and agreement with plan and patient is stable for discharge home at this time.  Final Clinical Impressions(s) / ED Diagnoses   Final diagnoses:  Chronic left-sided low back pain with left-sided sciatica    New Prescriptions New Prescriptions   MELOXICAM (MOBIC) 15 MG TABLET    Take 1 tablet (15 mg total) by mouth daily.     Renita Papa, PA-C 05/01/17 1741    Dorie Rank, MD 05/02/17 [phone]

## 2017-05-01 NOTE — ED Notes (Signed)
Bed: [facility] Expected date:  Expected time:  Means of arrival:  Comments: EMS- sciatica

## 2017-05-24 ENCOUNTER — Inpatient Hospital Stay (HOSPITAL_COMMUNITY)
Admission: AD | Admit: 2017-05-24 | Discharge: 2017-05-24 | Disposition: A | Discharge status: Home / Self Care | Payer: Medicaid Other | Source: Ambulatory Visit | Attending: Obstetrics & Gynecology | Admitting: Obstetrics & Gynecology

## 2017-05-24 ENCOUNTER — Inpatient Hospital Stay (HOSPITAL_COMMUNITY): Payer: Medicaid Other

## 2017-05-24 ENCOUNTER — Encounter (HOSPITAL_COMMUNITY): Payer: Self-pay | Admitting: *Deleted

## 2017-05-24 DIAGNOSIS — F17210 Nicotine dependence, cigarettes, uncomplicated: Secondary | ICD-10-CM | POA: Diagnosis not present

## 2017-05-24 DIAGNOSIS — Z90710 Acquired absence of both cervix and uterus: Secondary | ICD-10-CM | POA: Insufficient documentation

## 2017-05-24 DIAGNOSIS — R102 Pelvic and perineal pain: Secondary | ICD-10-CM | POA: Diagnosis not present

## 2017-05-24 LAB — RAPID URINE DRUG SCREEN, HOSP PERFORMED
Amphetamines: NOT DETECTED
Barbiturates: NOT DETECTED
Benzodiazepines: NOT DETECTED
COCAINE: NOT DETECTED
OPIATES: NOT DETECTED
TETRAHYDROCANNABINOL: NOT DETECTED

## 2017-05-24 LAB — URINALYSIS, ROUTINE W REFLEX MICROSCOPIC
BILIRUBIN URINE: NEGATIVE
Glucose, UA: NEGATIVE mg/dL
HGB URINE DIPSTICK: NEGATIVE
KETONES UR: NEGATIVE mg/dL
Leukocytes, UA: NEGATIVE
NITRITE: NEGATIVE
PROTEIN: NEGATIVE mg/dL
SPECIFIC GRAVITY, URINE: 1.009 (ref 1.005–1.030)
pH: 7 (ref 5.0–8.0)

## 2017-05-24 LAB — CBC
HEMATOCRIT: 38.3 % (ref 36.0–46.0)
HEMOGLOBIN: 13.1 g/dL (ref 12.0–15.0)
MCH: 33.9 pg (ref 26.0–34.0)
MCHC: 34.2 g/dL (ref 30.0–36.0)
MCV: 99.2 fL (ref 78.0–100.0)
Platelets: 173 10*3/uL (ref 150–400)
RBC: 3.86 MIL/uL — AB (ref 3.87–5.11)
RDW: 13.1 % (ref 11.5–15.5)
WBC: 8.6 10*3/uL (ref 4.0–10.5)

## 2017-05-24 LAB — COMPREHENSIVE METABOLIC PANEL
ALK PHOS: 75 U/L (ref 38–126)
ALT: 14 U/L (ref 14–54)
ANION GAP: 7 (ref 5–15)
AST: 14 U/L — ABNORMAL LOW (ref 15–41)
Albumin: 4 g/dL (ref 3.5–5.0)
BILIRUBIN TOTAL: 0.6 mg/dL (ref 0.3–1.2)
BUN: 15 mg/dL (ref 6–20)
CALCIUM: 8.8 mg/dL — AB (ref 8.9–10.3)
CO2: 27 mmol/L (ref 22–32)
CREATININE: 0.43 mg/dL — AB (ref 0.44–1.00)
Chloride: 104 mmol/L (ref 101–111)
Glucose, Bld: 95 mg/dL (ref 65–99)
Potassium: 3.5 mmol/L (ref 3.5–5.1)
SODIUM: 138 mmol/L (ref 135–145)
TOTAL PROTEIN: 6.4 g/dL — AB (ref 6.5–8.1)

## 2017-05-24 MED ORDER — KETOROLAC TROMETHAMINE 60 MG/2ML IM SOLN
60.0000 mg | Freq: Once | INTRAMUSCULAR | Status: AC
  Administered 2017-05-24: 60 mg via INTRAMUSCULAR
  Filled 2017-05-24: qty 2

## 2017-05-24 MED ORDER — PROMETHAZINE HCL 25 MG/ML IJ SOLN
25.0000 mg | Freq: Once | INTRAMUSCULAR | Status: AC
  Administered 2017-05-24: 25 mg via INTRAMUSCULAR
  Filled 2017-05-24: qty 1

## 2017-05-24 NOTE — MAU Provider Note (Signed)
History     CSN: [id number]  Arrival date and time: 05/24/17 2124   First Provider Initiated Contact with Patient 05/24/17 2145      Chief Complaint  Patient presents with  . Pelvic Pain   Gina [name] is a 41 y.o. [id number] who presents today with lower abdominal pain. She states that she has had the pain for about 1.5 weeks. She reports that she was seen in the office today, and had an US done there. She reports that the US showed 2 cysts. She also has a hx of endometriosis. She states that she was started on an estrogen medication today. She had a hysterectomy in 2005. She still has both ovaries. She reports that every couple of years they have to "go in and remove the endometriosis". She was also dx with UTI at the office today, and she has started the antibiotic for that.  She has multiple visits to the ED for pain complaints, and states that she has been told she cannot have any more CT scans. She reports that she has had 25 CT scans in her lifetime. On the [place] she is getting multiple benzo and narcotic rx from 3 different providers.    Pelvic Pain  The patient's primary symptoms include pelvic pain. This is a new problem. The current episode started in the past 7 days. The problem occurs constantly. The problem has been gradually worsening. The pain is severe. The problem affects the right side. She is not pregnant. Associated symptoms include nausea. Pertinent negatives include no chills, dysuria, fever or vomiting. The vaginal discharge was normal. There has been no bleeding. Nothing aggravates the symptoms. Treatments tried: Oxycodone, last taken around 1830.  The treatment provided no relief. Menstrual history: post-hysterectomy. Her past medical history is significant for endometriosis, a gynecological surgery and ovarian cysts.   Past Medical History:  Diagnosis Date  . ABDOMINAL WALL HERNIA 02/27/2010  . Abnormal Pap smear   . ALLERGIC RHINITIS 06/02/2007  . ANOREXIA,  CHRONIC 08/07/2008  . ANXIETY 09/15/2010  . BACK PAIN, THORACIC REGION 07/07/2007  . Bipolar disorder ([place])   . CERVICAL RADICULOPATHY 12/11/2008  . Condyloma acuminatum 04/23/2009  . CONSTIPATION 09/15/2010  . DYSPHAGIA UNSPECIFIED 09/09/2009  . 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 ENDOMETRIOSIS 12/16/2009  . FATIGUE 06/11/2010  . Fibromyalgia   . GERD 10/23/2009  . Heart palpitations   . HERPES SIMPLEX INFECTION 06/11/2010  . LENTIGO 04/23/2009  . LUNG NODULE 06/17/2010  . Narcotic abuse, continuous   . Ovarian cyst   . Palpitations 10/23/2009  . Panic disorder   . Rheumatoid arthritis(714.0)   . Stricture and stenosis of esophagus 05/13/2010  . TOBACCO ABUSE 08/07/2008  . Urinary tract infection     Past Surgical History:  Procedure Laterality Date  . ABDOMINAL SURGERY    . ABDOMINAL WALL MESH  REMOVAL    . CESAREAN SECTION     x2   . DILATION AND CURETTAGE OF UTERUS     x1   . ENDOMETRIAL ABLATION    . esophageal dilatation x 4    . GUM SURGERY    . HERNIA REPAIR     X3  . PARTIAL HYSTERECTOMY      Family History  Problem Relation Age of Onset  . Depression Mother   . Arthritis Mother   . Hyperlipidemia Father   . Hypertension Father     Social History  Substance Use Topics  . Smoking status: Current Every Day Smoker  Packs/day: 1.00    Types: Cigarettes  . Smokeless tobacco: Never Used  . Alcohol use No    Allergies:  Allergies  Allergen Reactions  . Lamictal [Lamotrigine] Anaphylaxis and Other (See Comments)    STEVEN JOHNSON'S SYNDROME  . Morphine And Related Other (See Comments)    Makes pt "very mean"  . Nicotine Swelling and Palpitations    Other reaction(s): Respiratory Distress (ALLERGY/intolerance), Swelling (ALLERGY/intolerance), Tachycardia / Palpitations  (intolerance) Patches, lozenges Patches caused palpations lozenges throat swelled  . Zofran Anaphylaxis  . Chantix [Varenicline Tartrate] Other (See Comments)     Diaphoresis, sweating.   Night terrors.  "went crazy"  . Codeine Nausea And Vomiting    Other reaction(s): Nausea & Vomiting (ALLERGY/intolerance)  . Haldol [Haloperidol] Itching, Rash and Other (See Comments)    FLUSHING ALSO,   . Ibuprofen Nausea And Vomiting  . Lidoderm [Lidocaine] Other (See Comments)    DOESN'T WORK FOR PATIENT  . Metaxalone Other (See Comments)    Pt does not remember reaction, REAL BAD REACTION  . Sulfa Antibiotics Diarrhea and Other (See Comments)    GI PAINS ALSO  . Toradol [Ketorolac Tromethamine] Nausea And Vomiting    Other reaction(s): Nausea & Vomiting (ALLERGY/intolerance)  . Amoxicillin Rash  . Penicillins Rash    Other reaction(s): Urticaria / Hives (ALLERGY) Has patient had a PCN reaction causing immediate rash, facial/tongue/throat swelling, SOB or lightheadedness with hypotension: No Has patient had a PCN reaction causing severe rash involving mucus membranes or skin necrosis: No Has patient had a PCN reaction that required hospitalization: No Has patient had a PCN reaction occurring within the last 10 years: No If all of the above answers are "NO", then may proceed with Cephalosporin use.    Prescriptions Prior to Admission  Medication Sig Dispense Refill Last Dose  . Brexpiprazole (REXULTI) 1 MG TABS Take 1 tablet by mouth every morning.   05/24/2017 at Unknown time  . clonazePAM (KLONOPIN) 1 MG tablet Take 1 mg by mouth 2 (two) times daily as needed for anxiety.   05/24/2017 at Unknown time  . escitalopram (LEXAPRO) 20 MG tablet Take 30 mg by mouth at bedtime.   05/24/2017 at Unknown time  . Iloperidone (FANAPT) 1 MG TABS Take 1 tablet by mouth 2 (two) times daily.    05/24/2017 at Unknown time  . lurasidone (LATUDA) 80 MG TABS tablet Take 80 mg by mouth at bedtime.    05/24/2017 at Unknown time  . methocarbamol (ROBAXIN) 750 MG tablet Take 750 mg by mouth every 8 (eight) hours as needed for muscle spasms.   Past Week at Unknown time  . norethindrone-ethinyl estradiol (JUNEL  FE,GILDESS FE,LOESTRIN FE) 1-20 MG-MCG tablet Take 1 tablet by mouth daily.     Marland Kitchen oxyCODONE-acetaminophen (PERCOCET) 7.5-325 MG tablet Take 1 tablet by mouth 4 (four) times daily.   05/24/2017 at 1900    Review of Systems  Constitutional: Negative for chills and fever.  Gastrointestinal: Positive for nausea. Negative for vomiting.  Genitourinary: Positive for pelvic pain. Negative for dysuria and vaginal bleeding.   Physical Exam   Blood pressure (!) 105/58, pulse 93, temperature 99 F (37.2 C), temperature source Oral, resp. rate 20, SpO2 97 %.  Physical Exam  Nursing note and vitals reviewed. Constitutional: She is oriented to person, place, and time. She appears well-developed and well-nourished. No distress.  HENT:  Head: Normocephalic.  Cardiovascular: Normal rate.   Respiratory: Effort normal.  GI: Soft. There is tenderness. There is no  rebound.  Neurological: She is alert and oriented to person, place, and time.  Skin: Skin is warm and dry.  Psychiatric: She has a normal mood and affect.   Results for orders placed or performed during the hospital encounter of 05/24/17 (from the past 24 hour(s))  Urinalysis, Routine w reflex microscopic     Status: Abnormal   Collection Time: 05/24/17  9:45 PM  Result Value Ref Range   Color, Urine STRAW (A) YELLOW   APPearance CLEAR CLEAR   Specific Gravity, Urine 1.009 1.005 - 1.030   pH 7.0 5.0 - 8.0   Glucose, UA NEGATIVE NEGATIVE mg/dL   Hgb urine dipstick NEGATIVE NEGATIVE   Bilirubin Urine NEGATIVE NEGATIVE   Ketones, ur NEGATIVE NEGATIVE mg/dL   Protein, ur NEGATIVE NEGATIVE mg/dL   Nitrite NEGATIVE NEGATIVE   Leukocytes, UA NEGATIVE NEGATIVE  Urine rapid drug screen (hosp performed)     Status: None   Collection Time: 05/24/17  9:45 PM  Result Value Ref Range   Opiates NONE DETECTED NONE DETECTED   Cocaine NONE DETECTED NONE DETECTED   Benzodiazepines NONE DETECTED NONE DETECTED   Amphetamines NONE DETECTED NONE DETECTED    Tetrahydrocannabinol NONE DETECTED NONE DETECTED   Barbiturates NONE DETECTED NONE DETECTED  CBC     Status: Abnormal   Collection Time: 05/24/17 10:22 PM  Result Value Ref Range   WBC 8.6 4.0 - 10.5 K/uL   RBC 3.86 (L) 3.87 - 5.11 MIL/uL   Hemoglobin 13.1 12.0 - 15.0 g/dL   HCT 38.3 36.0 - 46.0 %   MCV 99.2 78.0 - 100.0 fL   MCH 33.9 26.0 - 34.0 pg   MCHC 34.2 30.0 - 36.0 g/dL   RDW 13.1 11.5 - 15.5 %   Platelets 173 150 - 400 K/uL  Comprehensive metabolic panel     Status: Abnormal   Collection Time: 05/24/17 10:22 PM  Result Value Ref Range   Sodium 138 135 - 145 mmol/L   Potassium 3.5 3.5 - 5.1 mmol/L   Chloride 104 101 - 111 mmol/L   CO2 27 22 - 32 mmol/L   Glucose, Bld 95 65 - 99 mg/dL   BUN 15 6 - 20 mg/dL   Creatinine, Ser 0.43 (L) 0.44 - 1.00 mg/dL   Calcium 8.8 (L) 8.9 - 10.3 mg/dL   Total Protein 6.4 (L) 6.5 - 8.1 g/dL   Albumin 4.0 3.5 - 5.0 g/dL   AST 14 (L) 15 - 41 U/L   ALT 14 14 - 54 U/L   Alkaline Phosphatase 75 38 - 126 U/L   Total Bilirubin 0.6 0.3 - 1.2 mg/dL   GFR calc non Af Amer >60 >60 mL/min   GFR calc Af Amer >60 >60 mL/min   Anion gap 7 5 - 15   US Transvaginal Non-ob  Result Date: 05/24/2017 CLINICAL DATA:  Pelvic pain, history of hysterectomy EXAM: TRANSABDOMINAL AND TRANSVAGINAL ULTRASOUND OF PELVIS TECHNIQUE: Both transabdominal and transvaginal ultrasound examinations of the pelvis were performed. Transabdominal technique was performed for global imaging of the pelvis including uterus, ovaries, adnexal regions, and pelvic cul-de-sac. It was necessary to proceed with endovaginal exam following the transabdominal exam to visualize the adnexa/ovaries. COMPARISON:  CT 07/02/2015, ultrasound 12/25/2014 FINDINGS: Uterus Surgically absent Endometrium Surgically absent Right ovary Measurements: 3.2 x 2.8 x 2.4 cm. Prominent follicle in the right ovary. Left ovary Not visualized Other findings Trace free fluid in the pelvis IMPRESSION: 1. Surgical  absence of the uterus. 2. Normal ultrasound appearance  of right ovary 3. Nonvisualization of left ovary Electronically Signed   By: Donavan Foil M.D.   On: 05/24/2017 23:21   US Pelvis Complete  Result Date: 05/24/2017 CLINICAL DATA:  Pelvic pain, history of hysterectomy EXAM: TRANSABDOMINAL AND TRANSVAGINAL ULTRASOUND OF PELVIS TECHNIQUE: Both transabdominal and transvaginal ultrasound examinations of the pelvis were performed. Transabdominal technique was performed for global imaging of the pelvis including uterus, ovaries, adnexal regions, and pelvic cul-de-sac. It was necessary to proceed with endovaginal exam following the transabdominal exam to visualize the adnexa/ovaries. COMPARISON:  CT 07/02/2015, ultrasound 12/25/2014 FINDINGS: Uterus Surgically absent Endometrium Surgically absent Right ovary Measurements: 3.2 x 2.8 x 2.4 cm. Prominent follicle in the right ovary. Left ovary Not visualized Other findings Trace free fluid in the pelvis IMPRESSION: 1. Surgical absence of the uterus. 2. Normal ultrasound appearance of right ovary 3. Nonvisualization of left ovary Electronically Signed   By: Donavan Foil M.D.   On: 05/24/2017 23:21    MAU Course  Procedures  MDM  Patient is requesting IV narcotic pain medication, IV phenergan (because she is allergic to zofran), and ativan for her "nerves".   2230: D/W Dr. Lynnette Caffey, ok for DC home if no WBC#, and ultrasound is unchanged from earlier today. FU with Dr. Maryruth Hancock as needed    Assessment and Plan   1. Pelvic pain    DC home Comfort measures reviewed  RX: no new RX given today. Continue with meds at home as needed  Return to MAU as needed FU with OB as planned  Follow-up Information    Morris, Megan, DO Follow up.   Specialty:  Obstetrics and Gynecology Contact information: [address] [place], [place] [address] [phone]            Marcille Buffy 05/24/2017, 11:48 PM

## 2017-05-24 NOTE — MAU Note (Signed)
Pt stating she has 2 ovarian cyst. Was seen in office today. Takes oxycodone for pain-last took around 6:30-7pm. It is not helping. Rates 9/10.

## 2017-05-24 NOTE — Discharge Instructions (Signed)
Pelvic Pain, Female °Pelvic pain is pain in your lower belly (abdomen), below your belly button and between your hips. The pain may start suddenly (acute), keep coming back (recurring), or last a long time (chronic). Pelvic pain that lasts longer than six months is considered chronic. There are many causes of pelvic pain. Sometimes the cause of your pelvic pain is not known. °Follow these instructions at home: °· Take over-the-counter and prescription medicines only as told by your doctor. °· Rest as told by your doctor. °· Do not have sex it if hurts. °· Keep a journal of your pelvic pain. Write down: °? When the pain started. °? Where the pain is located. °? What seems to make the pain better or worse, such as food or your menstrual cycle. °? Any symptoms you have along with the pain. °· Keep all follow-up visits as told by your doctor. This is important. °Contact a doctor if: °· Medicine does not help your pain. °· Your pain comes back. °· You have new symptoms. °· You have unusual vaginal discharge or bleeding. °· You have a fever or chills. °· You are having a hard time pooping (constipation). °· You have blood in your pee (urine) or poop (stool). °· Your pee smells bad. °· You feel weak or lightheaded. °Get help right away if: °· You have sudden pain that is very bad. °· Your pain continues to get worse. °· You have very bad pain and also have any of the following symptoms: °? A fever. °? Feeling stick to your stomach (nausea). °? Throwing up (vomiting). °? Being very sweaty. °· You pass out (lose consciousness). °This information is not intended to replace advice given to you by your health care provider. Make sure you discuss any questions you have with your health care provider. °Document Released: 04/05/2008 Document Revised: 11/12/2015 Document Reviewed: 08/08/2015 °Elsevier Interactive Patient Education © 2018 [employer] ° °

## 2017-06-30 ENCOUNTER — Other Ambulatory Visit: Payer: Self-pay | Admitting: Obstetrics & Gynecology

## 2017-08-31 ENCOUNTER — Encounter (HOSPITAL_COMMUNITY): Payer: Self-pay

## 2017-08-31 ENCOUNTER — Emergency Department (HOSPITAL_COMMUNITY)
Admission: EM | Admit: 2017-08-31 | Discharge: 2017-08-31 | Disposition: A | Discharge status: Home / Self Care | Payer: Medicaid Other | Attending: Emergency Medicine | Admitting: Emergency Medicine

## 2017-08-31 DIAGNOSIS — Z79899 Other long term (current) drug therapy: Secondary | ICD-10-CM | POA: Diagnosis not present

## 2017-08-31 DIAGNOSIS — G8929 Other chronic pain: Secondary | ICD-10-CM | POA: Diagnosis not present

## 2017-08-31 DIAGNOSIS — F17210 Nicotine dependence, cigarettes, uncomplicated: Secondary | ICD-10-CM | POA: Diagnosis not present

## 2017-08-31 DIAGNOSIS — M5416 Radiculopathy, lumbar region: Secondary | ICD-10-CM | POA: Diagnosis not present

## 2017-08-31 DIAGNOSIS — M545 Low back pain: Secondary | ICD-10-CM | POA: Diagnosis present

## 2017-08-31 MED ORDER — KETOROLAC TROMETHAMINE 15 MG/ML IJ SOLN
15.0000 mg | Freq: Once | INTRAMUSCULAR | Status: DC
  Filled 2017-08-31: qty 1

## 2017-08-31 MED ORDER — METHYLPREDNISOLONE 4 MG PO TBPK: ORAL_TABLET | ORAL | 0 refills | Status: DC

## 2017-08-31 MED ORDER — PROMETHAZINE HCL 25 MG/ML IJ SOLN
12.5000 mg | Freq: Once | INTRAMUSCULAR | Status: AC
  Administered 2017-08-31: 12.5 mg via INTRAVENOUS
  Filled 2017-08-31: qty 1

## 2017-08-31 MED ORDER — PROMETHAZINE HCL 25 MG/ML IJ SOLN: 12.5000 mg | Freq: Once | INTRAMUSCULAR | Status: DC

## 2017-08-31 MED ORDER — METHOCARBAMOL 1000 MG/10ML IJ SOLN: 500.0000 mg | Freq: Once | INTRAMUSCULAR | Status: DC

## 2017-08-31 MED ORDER — PROMETHAZINE HCL 25 MG PO TABS
25.0000 mg | ORAL_TABLET | Freq: Once | ORAL | Status: DC
  Filled 2017-08-31: qty 1

## 2017-08-31 MED ORDER — HYDROMORPHONE HCL 1 MG/ML IJ SOLN
1.0000 mg | Freq: Once | INTRAMUSCULAR | Status: AC
  Administered 2017-08-31: 1 mg via INTRAVENOUS
  Filled 2017-08-31: qty 1

## 2017-08-31 MED ORDER — DEXAMETHASONE SODIUM PHOSPHATE 10 MG/ML IJ SOLN
10.0000 mg | Freq: Once | INTRAMUSCULAR | Status: AC
  Administered 2017-08-31: 10 mg via INTRAVENOUS
  Filled 2017-08-31: qty 1

## 2017-08-31 MED ORDER — METHOCARBAMOL 1000 MG/10ML IJ SOLN
500.0000 mg | Freq: Once | INTRAMUSCULAR | Status: AC
  Administered 2017-08-31: 500 mg via INTRAVENOUS
  Filled 2017-08-31: qty 550

## 2017-08-31 NOTE — ED Provider Notes (Signed)
[facility] DEPT Provider Note   CSN: [id number] Arrival date & time: 08/31/17  1442     History   Chief Complaint Chief Complaint  Patient presents with  . Back Pain    HPI Gina [name] is a 41 y.o. female.  HPI   41 year old female with past medical history as below including chronic back pain followed by neurology and pain management who presents with lower back pain.  The patient has a long, well documented history of the same.  She states that her pain had been relatively controlled after her most recent injection in July, but over the last several days, she has had acute worsening of her pain.  She believes this is related to watching a 99-year-old child that she occasionally has to pick up.  She reports severe, aching, throbbing, left paraspinal lumbar back pain that is similar to her usual pain, only worse.  She has shooting, stabbing pain down the back of her left leg.  This pain is worse with movement and palpation.  She denies any alleviating factors.  Denies any fevers or chills.  No lower extremity numbness or weakness.  No abdominal pain, nausea, or vomiting, until the pain gets bad, at which point she does get nauseous.  No recent falls or trauma.  Past Medical History:  Diagnosis Date  . ABDOMINAL WALL HERNIA 02/27/2010  . Abnormal Pap smear   . ALLERGIC RHINITIS 06/02/2007  . ANOREXIA, CHRONIC 08/07/2008  . ANXIETY 09/15/2010  . BACK PAIN, THORACIC REGION 07/07/2007  . Bipolar disorder ([place])   . CERVICAL RADICULOPATHY 12/11/2008  . Condyloma acuminatum 04/23/2009  . CONSTIPATION 09/15/2010  . DYSPHAGIA UNSPECIFIED 09/09/2009  . 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 ENDOMETRIOSIS 12/16/2009  . FATIGUE 06/11/2010  . Fibromyalgia   . GERD 10/23/2009  . Heart palpitations   . HERPES SIMPLEX INFECTION 06/11/2010  . LENTIGO 04/23/2009  . LUNG NODULE 06/17/2010  . Narcotic abuse, continuous ([place])   . Ovarian cyst   . Palpitations 10/23/2009  .  Panic disorder   . Rheumatoid arthritis(714.0)   . Stricture and stenosis of esophagus 05/13/2010  . TOBACCO ABUSE 08/07/2008  . Urinary tract infection     Patient Active Problem List   Diagnosis Date Noted  . Bipolar 1 disorder, mixed, moderate (HCC) 07/15/2015    Class: Chronic  . 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 Sciatica 01/09/2015  . Reactive hypoglycemia 12/17/2013  . Musculoskeletal malfunction arising from mental factors 04/04/2013  . Incisional hernia 11/15/2012  . Other postprocedural status(V45.89) 11/15/2012  . Abdominal pain 11/11/2012  . Chronic pain syndrome 11/11/2012  . Disorder of sacrum 11/11/2012  . Heartburn 10/04/2011  . DYSPHAGIA 10/04/2011  . Depression with anxiety 09/15/2010  . CONSTIPATION 09/15/2010  . LUNG NODULE 06/17/2010  . HERPES SIMPLEX INFECTION 06/11/2010  . FATIGUE 06/11/2010  . STRICTURE AND STENOSIS OF ESOPHAGUS 05/13/2010  . ABDOMINAL WALL HERNIA 02/27/2010  . ENDOMETRIOSIS 12/16/2009  . GERD 10/23/2009  . PALPITATIONS 10/23/2009  . 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 CONDYLOMA ACUMINATUM 04/23/2009  . LENTIGO 04/23/2009  . DENTAL PAIN 03/19/2009  . CERVICAL RADICULOPATHY 12/11/2008  . 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 TOBACCO ABUSE 08/07/2008  . BACK PAIN, THORACIC REGION 07/07/2007  . ALLERGIC RHINITIS 06/02/2007    Past Surgical History:  Procedure Laterality Date  . ABDOMINAL SURGERY    . ABDOMINAL WALL MESH  REMOVAL    . ABLATION ON ENDOMETRIOSIS    .  CESAREAN SECTION     x2   . DILATION AND CURETTAGE OF UTERUS     x1   . ENDOMETRIAL ABLATION    . esophageal dilatation x 4    . GUM SURGERY    . HERNIA REPAIR     X3  . PARTIAL HYSTERECTOMY      OB History    Gravida Para Term Preterm AB Living   3 2 2  0 1 2   SAB TAB Ectopic Multiple Live Births   1 0 0 0 1       Home Medications    Prior to Admission medications     Medication Sig Start Date End Date Taking? Authorizing Provider  acetaminophen (TYLENOL) 325 MG tablet Take by mouth every 6 (six) hours as needed for mild pain.   Yes [provider]  clonazePAM (KLONOPIN) 1 MG tablet Take 0.05 mg by mouth 3 (three) times daily.    Yes [provider]  escitalopram (LEXAPRO) 20 MG tablet Take 30 mg by mouth at bedtime.   Yes [provider]  Iloperidone (FANAPT) 1 MG TABS Take 1 tablet by mouth daily.    Yes [provider]  lurasidone (LATUDA) 80 MG TABS tablet Take 80 mg by mouth at bedtime.    Yes [provider]  methocarbamol (ROBAXIN) 750 MG tablet Take 750 mg by mouth every 8 (eight) hours as needed for muscle spasms.   Yes [provider]  oxyCODONE-acetaminophen (PERCOCET) 7.5-325 MG tablet Take 1 tablet by mouth 4 (four) times daily.   Yes [provider]  methylPREDNISolone (MEDROL DOSEPAK) 4 MG TBPK tablet Take as directed on package 08/31/17   Duffy Bruce, MD    Family History Family History  Problem Relation Age of Onset  . Depression Mother   . Arthritis Mother   . Hyperlipidemia Father   . Hypertension Father     Social History Social History  Substance Use Topics  . Smoking status: Current Every Day Smoker    Packs/day: 1.00    Types: Cigarettes  . Smokeless tobacco: Never Used  . Alcohol use No     Allergies   Lamictal [lamotrigine]; Morphine and related; Nicotine; Zofran; Chantix [varenicline tartrate]; Codeine; Haldol [haloperidol]; Ibuprofen; Lidoderm [lidocaine]; Metaxalone; Sulfa antibiotics; Toradol [ketorolac tromethamine]; Duloxetine; Gabapentin; Amoxicillin; Aripiprazole; and Penicillins   Review of Systems Review of Systems  Constitutional: Positive for fatigue. Negative for chills and fever.  HENT: Negative for congestion and rhinorrhea.   Eyes: Negative for visual disturbance.  Respiratory: Negative for cough, shortness of breath and wheezing.    Cardiovascular: Negative for chest pain and leg swelling.  Gastrointestinal: Negative for abdominal pain, diarrhea, nausea and vomiting.  Genitourinary: Negative for dysuria and flank pain.  Musculoskeletal: Positive for back pain. Negative for neck pain and neck stiffness.  Skin: Negative for rash and wound.  Allergic/Immunologic: Negative for immunocompromised state.  Neurological: Positive for weakness and numbness. Negative for syncope and headaches.  All other systems reviewed and are negative.    Physical Exam Updated Vital Signs BP 101/68 (BP Location: Right Arm)   Pulse 67   Resp 18   Ht 5\' 7"  (1.702 m)   Wt 65.8 kg (145 lb)   SpO2 96%   BMI 22.71 kg/m   Physical Exam  Constitutional: She is oriented to person, place, and time. She appears well-developed and well-nourished. No distress.  HENT:  Head: Normocephalic and atraumatic.  Mouth/Throat: Oropharynx is clear and moist.  Eyes: Conjunctivae  are normal.  Neck: Neck supple.  Cardiovascular: Normal rate, regular rhythm and normal heart sounds.  Exam reveals no friction rub.   No murmur heard. Pulmonary/Chest: Effort normal and breath sounds normal. No respiratory distress. She has no wheezes. She has no rales.  Abdominal: Soft. Bowel sounds are normal. She exhibits no distension.  No pulsatile masses  Musculoskeletal: She exhibits no edema.  Neurological: She is alert and oriented to person, place, and time. She exhibits normal muscle tone.  Skin: Skin is warm. Capillary refill takes less than 2 seconds.  Psychiatric: She has a normal mood and affect.  Nursing note and vitals reviewed.   Spine Exam: Inspection/Palpation: Exquisite TTP to right lumbar paraspinal area. No midline TTP or deformity. Strength: 5/5 throughout LE bilaterally (hip flexion/extension, adduction/abduction; knee flexion/extension; foot dorsiflexion/plantarflexion, inversion/eversion; great toe inversion) Sensation: Intact to light touch in  proximal and distal LE bilaterally Reflexes: 2+ quadriceps and achilles reflexes  ED Treatments / Results  Labs (all labs ordered are listed, but only abnormal results are displayed) Labs Reviewed - No data to display  EKG  EKG Interpretation None       Radiology No results found.  Procedures Procedures (including critical care time)  Medications Ordered in ED Medications  HYDROmorphone (DILAUDID) injection 1 mg (1 mg Intravenous Given 08/31/17 1544)  methocarbamol (ROBAXIN) 500 mg in dextrose 5 % 50 mL IVPB (0 mg Intravenous Stopped 08/31/17 1700)  promethazine (PHENERGAN) injection 12.5 mg (12.5 mg Intravenous Given 08/31/17 1725)  dexamethasone (DECADRON) injection 10 mg (10 mg Intravenous Given 08/31/17 1636)     Initial Impression / Assessment and Plan / ED Course  I have reviewed the triage vital signs and the nursing notes.  Pertinent labs & imaging results that were available during my care of the patient were reviewed by me and considered in my medical decision making (see chart for details).     41 yo F with PMHx as above here with acute on chronic lower back pain.  Patient has well-documented history of recurrent ED visits for the same.  She has no new red flag symptoms.  No fevers, weight loss, or symptoms to suggest underlying malignancy or infectious process.  She has no saddle anesthesia, no loss of bowel or bladder continence, or other evidence to suggest cauda equina or acute cord compression.  She denies any recent falls and has been extensively imaged for this.  Do not feel repeat imaging is indicated. She was given a one time dose of pain medicine along with robaxin, decadron with improvement. She has made multiple requests for repeated narcotics and I discussed that repeated doses are NOT indicated for chronic pain. Will discharge with outpatient follow-up.   This note was prepared with assistance of Systems analyst. Occasional wrong-word  or sound-a-like substitutions may have occurred due to the inherent limitations of voice recognition software.  Final Clinical Impressions(s) / ED Diagnoses   Final diagnoses:  Chronic radicular pain of lower back    New Prescriptions Discharge Medication List as of 08/31/2017  5:00 PM    START taking these medications   Details  methylPREDNISolone (MEDROL DOSEPAK) 4 MG TBPK tablet Take as directed on package, Print         Duffy Bruce, MD 09/01/17 1126

## 2017-08-31 NOTE — ED Notes (Signed)
Pt ambulated to the bathroom and back to her room, complained that she could not put much weight on her leg.

## 2017-08-31 NOTE — ED Notes (Signed)
Bed: WA25 Expected date:  Expected time:  Means of arrival:  Comments: EMS- back pain  

## 2017-08-31 NOTE — ED Triage Notes (Addendum)
Pt has chronic left back pain. Pt received 218mcg of fentanyl by [facility]. Pt has medical hx of sciatica. Pt took 2x Robaxin, pt also took oxycodone.

## 2017-08-31 NOTE — ED Notes (Signed)
ED Provider at bedside. 

## 2017-12-26 ENCOUNTER — Encounter (HOSPITAL_COMMUNITY): Payer: Self-pay | Admitting: Emergency Medicine

## 2017-12-26 ENCOUNTER — Emergency Department (HOSPITAL_COMMUNITY)
Admission: EM | Admit: 2017-12-26 | Discharge: 2017-12-26 | Disposition: A | Discharge status: Home / Self Care | Payer: Medicare Other | Attending: Emergency Medicine | Admitting: Emergency Medicine

## 2017-12-26 DIAGNOSIS — F319 Bipolar disorder, unspecified: Secondary | ICD-10-CM | POA: Diagnosis not present

## 2017-12-26 DIAGNOSIS — Z79899 Other long term (current) drug therapy: Secondary | ICD-10-CM | POA: Insufficient documentation

## 2017-12-26 DIAGNOSIS — M5442 Lumbago with sciatica, left side: Secondary | ICD-10-CM | POA: Diagnosis not present

## 2017-12-26 DIAGNOSIS — R112 Nausea with vomiting, unspecified: Secondary | ICD-10-CM | POA: Diagnosis not present

## 2017-12-26 DIAGNOSIS — F17210 Nicotine dependence, cigarettes, uncomplicated: Secondary | ICD-10-CM | POA: Diagnosis not present

## 2017-12-26 DIAGNOSIS — M5432 Sciatica, left side: Secondary | ICD-10-CM

## 2017-12-26 DIAGNOSIS — M545 Low back pain: Secondary | ICD-10-CM | POA: Diagnosis present

## 2017-12-26 MED ORDER — HYDROMORPHONE HCL 1 MG/ML IJ SOLN
1.0000 mg | Freq: Once | INTRAMUSCULAR | Status: AC
  Administered 2017-12-26: 1 mg via INTRAVENOUS
  Filled 2017-12-26: qty 1

## 2017-12-26 MED ORDER — LIDOCAINE 5 % EX PTCH
1.0000 | MEDICATED_PATCH | CUTANEOUS | Status: DC
  Administered 2017-12-26: 1 via TRANSDERMAL
  Filled 2017-12-26: qty 1

## 2017-12-26 MED ORDER — ACETAMINOPHEN 500 MG PO TABS
1000.0000 mg | ORAL_TABLET | Freq: Once | ORAL | Status: DC
  Filled 2017-12-26: qty 2

## 2017-12-26 MED ORDER — DIAZEPAM 5 MG/ML IJ SOLN
3.0000 mg | Freq: Once | INTRAMUSCULAR | Status: AC
  Administered 2017-12-26: 3 mg via INTRAVENOUS
  Filled 2017-12-26: qty 2

## 2017-12-26 MED ORDER — OXYCODONE HCL 5 MG PO TABS
10.0000 mg | ORAL_TABLET | Freq: Once | ORAL | Status: DC
  Filled 2017-12-26: qty 2

## 2017-12-26 MED ORDER — KETOROLAC TROMETHAMINE 30 MG/ML IJ SOLN
30.0000 mg | Freq: Once | INTRAMUSCULAR | Status: AC
  Administered 2017-12-26: 30 mg via INTRAVENOUS
  Filled 2017-12-26: qty 1

## 2017-12-26 MED ORDER — CYCLOBENZAPRINE HCL 10 MG PO TABS
10.0000 mg | ORAL_TABLET | Freq: Once | ORAL | Status: AC
  Administered 2017-12-26: 10 mg via ORAL
  Filled 2017-12-26: qty 1

## 2017-12-26 MED ORDER — DEXAMETHASONE SODIUM PHOSPHATE 10 MG/ML IJ SOLN
10.0000 mg | Freq: Once | INTRAMUSCULAR | Status: AC
  Administered 2017-12-26: 10 mg via INTRAVENOUS
  Filled 2017-12-26: qty 1

## 2017-12-26 MED ORDER — PROMETHAZINE HCL 25 MG PO TABS
25.0000 mg | ORAL_TABLET | Freq: Once | ORAL | Status: AC
  Administered 2017-12-26: 25 mg via ORAL
  Filled 2017-12-26: qty 1

## 2017-12-26 MED ORDER — LORAZEPAM 1 MG PO TABS
1.0000 mg | ORAL_TABLET | Freq: Once | ORAL | Status: AC
  Administered 2017-12-26: 1 mg via ORAL
  Filled 2017-12-26: qty 1

## 2017-12-26 NOTE — ED Provider Notes (Signed)
[facility] DEPT Provider Note   CSN: [id number] Arrival date & time: 12/26/17  1726     History   Chief Complaint Chief Complaint  Patient presents with  . Back Pain    HPI Kayle Passarelli is a 42 y.o. female.  HPI   42 year old P female with a history of chronic pain presents with concern for acute left-sided back pain radiating down the left leg.  Reports that she was vacuuming today, went to lean over, stood up and had severe pain in the left side of her back, with radiation down the leg.  Reports it feels like a stabbing pain.  The pain is severe, greater than 10 out of 10.  She received fentanyl in route with EMS, but it did not help.  She reports she took oxycodone at 2:00 without any relief, and additionally took Robaxin at 4:00PM but pain has continued.  Reports that whenever her pain gets severe, she has difficulty walking, reports that the pain feels like a tingling type of pain but denies any numbness.  Denies any loss of control of her bowel or bladder.  Denies chronic steroid use, IV drug use, fevers, or urinary symptoms.  Denies any recent trauma or falls.  Reports she has nausea and vomiting due to the pain, and the only way she could tolerate nausea medicine would be Phenergan IV.  Past Medical History:  Diagnosis Date  . ABDOMINAL WALL HERNIA 02/27/2010  . Abnormal Pap smear   . ALLERGIC RHINITIS 06/02/2007  . ANOREXIA, CHRONIC 08/07/2008  . ANXIETY 09/15/2010  . BACK PAIN, THORACIC REGION 07/07/2007  . Bipolar disorder ([place])   . CERVICAL RADICULOPATHY 12/11/2008  . Condyloma acuminatum 04/23/2009  . CONSTIPATION 09/15/2010  . DYSPHAGIA UNSPECIFIED 09/09/2009  . 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 ENDOMETRIOSIS 12/16/2009  . FATIGUE 06/11/2010  . Fibromyalgia   . GERD 10/23/2009  . Heart palpitations   . HERPES SIMPLEX INFECTION 06/11/2010  . LENTIGO 04/23/2009  . LUNG NODULE 06/17/2010  . Narcotic abuse, continuous ([place])   . Ovarian cyst   .  Palpitations 10/23/2009  . Panic disorder   . Rheumatoid arthritis(714.0)   . Stricture and stenosis of esophagus 05/13/2010  . TOBACCO ABUSE 08/07/2008  . Urinary tract infection     Patient Active Problem List   Diagnosis Date Noted  . Bipolar 1 disorder, mixed, moderate (HCC) 07/15/2015    Class: Chronic  . 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 Sciatica 01/09/2015  . Reactive hypoglycemia 12/17/2013  . Musculoskeletal malfunction arising from mental factors 04/04/2013  . Incisional hernia 11/15/2012  . Other postprocedural status(V45.89) 11/15/2012  . Abdominal pain 11/11/2012  . Chronic pain syndrome 11/11/2012  . Disorder of sacrum 11/11/2012  . Heartburn 10/04/2011  . DYSPHAGIA 10/04/2011  . Depression with anxiety 09/15/2010  . CONSTIPATION 09/15/2010  . LUNG NODULE 06/17/2010  . HERPES SIMPLEX INFECTION 06/11/2010  . FATIGUE 06/11/2010  . STRICTURE AND STENOSIS OF ESOPHAGUS 05/13/2010  . ABDOMINAL WALL HERNIA 02/27/2010  . ENDOMETRIOSIS 12/16/2009  . GERD 10/23/2009  . PALPITATIONS 10/23/2009  . 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 CONDYLOMA ACUMINATUM 04/23/2009  . LENTIGO 04/23/2009  . DENTAL PAIN 03/19/2009  . CERVICAL RADICULOPATHY 12/11/2008  . 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 TOBACCO ABUSE 08/07/2008  . BACK PAIN, THORACIC REGION 07/07/2007  . ALLERGIC RHINITIS 06/02/2007    Past Surgical History:  Procedure Laterality Date  . ABDOMINAL SURGERY    .  ABDOMINAL WALL MESH  REMOVAL    . ABLATION ON ENDOMETRIOSIS    . CESAREAN SECTION     x2   . DILATION AND CURETTAGE OF UTERUS     x1   . ENDOMETRIAL ABLATION    . esophageal dilatation x 4    . GUM SURGERY    . HERNIA REPAIR     X3  . PARTIAL HYSTERECTOMY      OB History    Gravida Para Term Preterm AB Living   3 2 2  0 1 2   SAB TAB Ectopic Multiple Live Births   1 0 0 0 1       Home Medications    Prior to  Admission medications   Medication Sig Start Date End Date Taking? Authorizing Provider  acetaminophen (TYLENOL) 325 MG tablet Take by mouth every 6 (six) hours as needed for mild pain.    [provider]  clonazePAM (KLONOPIN) 1 MG tablet Take 0.05 mg by mouth 3 (three) times daily.     [provider]  escitalopram (LEXAPRO) 20 MG tablet Take 30 mg by mouth at bedtime.    [provider]  Iloperidone (FANAPT) 1 MG TABS Take 1 tablet by mouth daily.     [provider]  lurasidone (LATUDA) 80 MG TABS tablet Take 80 mg by mouth at bedtime.     [provider]  methocarbamol (ROBAXIN) 750 MG tablet Take 750 mg by mouth every 8 (eight) hours as needed for muscle spasms.    [provider]  methylPREDNISolone (MEDROL DOSEPAK) 4 MG TBPK tablet Take as directed on package 08/31/17   Duffy Bruce, MD  oxyCODONE-acetaminophen (PERCOCET) 7.5-325 MG tablet Take 1 tablet by mouth 4 (four) times daily.    [provider]    Family History Family History  Problem Relation Age of Onset  . Depression Mother   . Arthritis Mother   . Hyperlipidemia Father   . Hypertension Father     Social History Social History   Tobacco Use  . Smoking status: Current Every Day Smoker    Packs/day: 1.00    Types: Cigarettes  . Smokeless tobacco: Never Used  Substance Use Topics  . Alcohol use: No  . Drug use: No     Allergies   Lamictal [lamotrigine]; Morphine and related; Nicotine; Zofran; Chantix [varenicline tartrate]; Codeine; Haldol [haloperidol]; Ibuprofen; Lidoderm [lidocaine]; Metaxalone; Sulfa antibiotics; Toradol [ketorolac tromethamine]; Duloxetine; Gabapentin; Amoxicillin; Aripiprazole; and Penicillins   Review of Systems Review of Systems  Constitutional: Negative for fever.  Gastrointestinal: Positive for nausea and vomiting. Negative for diarrhea.  Genitourinary: Negative for difficulty urinating and dysuria.    Musculoskeletal: Positive for back pain and gait problem (due to pain).  Neurological: Negative for weakness and numbness (reports tingling pain).     Physical Exam Updated Vital Signs BP 92/61 (BP Location: Left Arm)   Pulse 73   Temp 98.4 F (36.9 C)   Resp 16   SpO2 96%   Physical Exam  Constitutional: She is oriented to person, place, and time. She appears well-developed and well-nourished. No distress.  HENT:  Head: Normocephalic and atraumatic.  Eyes: Conjunctivae and EOM are normal.  Neck: Normal range of motion.  Cardiovascular: Normal rate, regular rhythm, normal heart sounds and intact distal pulses. Exam reveals no gallop and no friction rub.  No murmur heard. Pulmonary/Chest: Effort normal and breath sounds normal. No respiratory distress. She has no wheezes. She has no rales.  Abdominal: Soft.  She exhibits no distension. There is no tenderness. There is no guarding.  Musculoskeletal: She exhibits no edema.       Lumbar back: She exhibits tenderness (severe with light touch) and bony tenderness.  Neurological: She is alert and oriented to person, place, and time. She has normal strength. No sensory deficit. GCS eye subscore is 4. GCS verbal subscore is 5. GCS motor subscore is 6.  Reports pain with movements but has 5/5 strength prior to give way  Skin: Skin is warm and dry. No rash noted. She is not diaphoretic. No erythema.  Nursing note and vitals reviewed.    ED Treatments / Results  Labs (all labs ordered are listed, but only abnormal results are displayed) Labs Reviewed - No data to display  EKG  EKG Interpretation None       Radiology No results found.  Procedures Procedures (including critical care time)  Medications Ordered in ED Medications  HYDROmorphone (DILAUDID) injection 1 mg (1 mg Intravenous Given 12/26/17 1834)  promethazine (PHENERGAN) tablet 25 mg (25 mg Oral Given 12/26/17 1834)  dexamethasone (DECADRON) injection 10 mg (10 mg  Intravenous Given 12/26/17 1834)  cyclobenzaprine (FLEXERIL) tablet 10 mg (10 mg Oral Given 12/26/17 1833)  LORazepam (ATIVAN) tablet 1 mg (1 mg Oral Given 12/26/17 1833)  ketorolac (TORADOL) 30 MG/ML injection 30 mg (30 mg Intravenous Given 12/26/17 2018)  diazepam (VALIUM) injection 3 mg (3 mg Intravenous Given 12/26/17 2019)     Initial Impression / Assessment and Plan / ED Course  I have reviewed the triage vital signs and the nursing notes.  Pertinent labs & imaging results that were available during my care of the patient were reviewed by me and considered in my medical decision making (see chart for details).    42 year old female with a history of chronic pain presents with concern for acute left-sided back pain radiating down the left leg.   Patient has a normal neurologic exam and denies any urinary retention or overflow incontinence, stool incontinence, saddle anesthesia, fever, IV drug use, trauma, chronic steroid use or immunocompromise and have low suspicion suspicion for cauda equina, fracture, epidural abscess, or vertebral osteomyelitis.  Suspect likely exacerbation of her chronic pack pain and sciatica, possible disc related.    Given single dose of dilaudid, given po phenergan, ativan and IV decadron.  Patient requesting additional Dilaudid.  Discussed that I will not give additional IV narcotics, however I am happy to treat her pain in other ways.  Gave patient home dose of 10 mg of oxycodone, gave 1 g of Tylenol, 3 mg of Valium, as well as toradol and lidocaine patch which pt agreed to.  Discussed hopefully after steroids this will help her pain, and that I recommend close follow up with her pain providers for further control of pain.   Final Clinical Impressions(s) / ED Diagnoses   Final diagnoses:  Sciatica of left side  Left-sided low back pain with left-sided sciatica, unspecified chronicity    ED Discharge Orders    None       Gareth Morgan, MD 12/27/17  [phone]

## 2017-12-26 NOTE — ED Triage Notes (Signed)
Per EMS-states history of sciatica and chronic pain-pain started at 2 pm and took home meds which did not help-states pain was found on floor, patient stated leg gave out and she lowered herself onto floor-did not hit head and no LOC-200 mcg of Fentanyl given in route-patient goes to pain clinic for treatment

## 2017-12-26 NOTE — ED Notes (Signed)
WARM COMPRESS GIVEN 

## 2017-12-26 NOTE — ED Notes (Signed)
Bed: [facility] Expected date:  Expected time:  Means of arrival:  Comments: No patients please

## 2018-01-30 ENCOUNTER — Encounter (HOSPITAL_COMMUNITY): Payer: Self-pay | Admitting: Emergency Medicine

## 2018-01-30 ENCOUNTER — Other Ambulatory Visit: Payer: Self-pay

## 2018-01-30 ENCOUNTER — Emergency Department (HOSPITAL_COMMUNITY)
Admission: EM | Admit: 2018-01-30 | Discharge: 2018-01-30 | Disposition: A | Discharge status: Home / Self Care | Payer: Medicare Other | Attending: Emergency Medicine | Admitting: Emergency Medicine

## 2018-01-30 DIAGNOSIS — Z79899 Other long term (current) drug therapy: Secondary | ICD-10-CM | POA: Diagnosis not present

## 2018-01-30 DIAGNOSIS — Y9289 Other specified places as the place of occurrence of the external cause: Secondary | ICD-10-CM | POA: Diagnosis not present

## 2018-01-30 DIAGNOSIS — Y9389 Activity, other specified: Secondary | ICD-10-CM | POA: Diagnosis not present

## 2018-01-30 DIAGNOSIS — M5442 Lumbago with sciatica, left side: Secondary | ICD-10-CM | POA: Diagnosis not present

## 2018-01-30 DIAGNOSIS — F17210 Nicotine dependence, cigarettes, uncomplicated: Secondary | ICD-10-CM | POA: Insufficient documentation

## 2018-01-30 DIAGNOSIS — Y999 Unspecified external cause status: Secondary | ICD-10-CM | POA: Diagnosis not present

## 2018-01-30 DIAGNOSIS — G8929 Other chronic pain: Secondary | ICD-10-CM | POA: Diagnosis not present

## 2018-01-30 DIAGNOSIS — X500XXA Overexertion from strenuous movement or load, initial encounter: Secondary | ICD-10-CM | POA: Insufficient documentation

## 2018-01-30 DIAGNOSIS — M545 Low back pain: Secondary | ICD-10-CM | POA: Diagnosis present

## 2018-01-30 MED ORDER — LORAZEPAM 1 MG PO TABS
1.0000 mg | ORAL_TABLET | Freq: Once | ORAL | Status: AC
  Administered 2018-01-30: 1 mg via ORAL
  Filled 2018-01-30: qty 1

## 2018-01-30 MED ORDER — HYDROMORPHONE HCL 1 MG/ML IJ SOLN
1.0000 mg | Freq: Once | INTRAMUSCULAR | Status: AC
  Administered 2018-01-30: 1 mg via INTRAVENOUS
  Filled 2018-01-30: qty 1

## 2018-01-30 MED ORDER — DEXAMETHASONE SODIUM PHOSPHATE 10 MG/ML IJ SOLN
10.0000 mg | Freq: Once | INTRAMUSCULAR | Status: AC
Start: 2018-01-30 — End: 2018-01-30
  Administered 2018-01-30: 10 mg via INTRAVENOUS
  Filled 2018-01-30: qty 1

## 2018-01-30 MED ORDER — PROMETHAZINE HCL 25 MG/ML IJ SOLN
25.0000 mg | Freq: Once | INTRAMUSCULAR | Status: AC
  Administered 2018-01-30: 25 mg via INTRAVENOUS
  Filled 2018-01-30: qty 1

## 2018-01-30 NOTE — ED Provider Notes (Signed)
[facility] DEPT Provider Note   CSN: [id number] Arrival date & time: 01/30/18  1652     History   Chief Complaint Chief Complaint  Patient presents with  . Back Pain    HPI Gina [name] is a 42 y.o. female with a history of chronic back pain, narcotic abuse, rheumatoid arthritis, sciatica who presents emergency department today for left low back pain.  Patient states she was helping her mother move furniture when she felt something pull of her left lower back.  She states this feels similar to her typical sciatic leg pain.  She notes pain in the left lower back that since tingling sensation down her left lower leg.  She reports this is a sharp pain.  When the pain is severe it is greater than 10/10.  She reports it is worse with ambulation and movement.  She is taken home oxycodone, Robaxin and Tylenol without relief.  She reports that this is the typical level of her pain when it becomes severe.  She states that when it is severe she has difficulty walking. Denies history of cancer, trauma, fever, night pain, IV drug use, recent spinal manipulation or procedures, upper back pain or neck pain, numbness of the lower extremities, urinary retention, loss of bowel/bladder function, saddle anesthesia, or unexplained weight loss. Denies dysuria, flank pain, suprapubic pain, frequency, urgency, or hematuria.   HPI  Past Medical History:  Diagnosis Date  . ABDOMINAL WALL HERNIA 02/27/2010  . Abnormal Pap smear   . ALLERGIC RHINITIS 06/02/2007  . ANOREXIA, CHRONIC 08/07/2008  . ANXIETY 09/15/2010  . BACK PAIN, THORACIC REGION 07/07/2007  . Bipolar disorder ([facility])   . CERVICAL RADICULOPATHY 12/11/2008  . Condyloma acuminatum 04/23/2009  . CONSTIPATION 09/15/2010  . DYSPHAGIA UNSPECIFIED 09/09/2009  . 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 ENDOMETRIOSIS 12/16/2009  . FATIGUE 06/11/2010  . Fibromyalgia   . GERD 10/23/2009  . Heart palpitations   . HERPES SIMPLEX INFECTION  06/11/2010  . LENTIGO 04/23/2009  . LUNG NODULE 06/17/2010  . Narcotic abuse, continuous ([place])   . Ovarian cyst   . Palpitations 10/23/2009  . Panic disorder   . Rheumatoid arthritis(714.0)   . Stricture and stenosis of esophagus 05/13/2010  . TOBACCO ABUSE 08/07/2008  . Urinary tract infection     Patient Active Problem List   Diagnosis Date Noted  . Bipolar 1 disorder, mixed, moderate (HCC) 07/15/2015    Class: Chronic  . 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 Sciatica 01/09/2015  . Reactive hypoglycemia 12/17/2013  . Musculoskeletal malfunction arising from mental factors 04/04/2013  . Incisional hernia 11/15/2012  . Other postprocedural status(V45.89) 11/15/2012  . Abdominal pain 11/11/2012  . Chronic pain syndrome 11/11/2012  . Disorder of sacrum 11/11/2012  . Heartburn 10/04/2011  . DYSPHAGIA 10/04/2011  . Depression with anxiety 09/15/2010  . CONSTIPATION 09/15/2010  . LUNG NODULE 06/17/2010  . HERPES SIMPLEX INFECTION 06/11/2010  . FATIGUE 06/11/2010  . STRICTURE AND STENOSIS OF ESOPHAGUS 05/13/2010  . ABDOMINAL WALL HERNIA 02/27/2010  . ENDOMETRIOSIS 12/16/2009  . GERD 10/23/2009  . PALPITATIONS 10/23/2009  . 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 CONDYLOMA ACUMINATUM 04/23/2009  . LENTIGO 04/23/2009  . DENTAL PAIN 03/19/2009  . CERVICAL RADICULOPATHY 12/11/2008  . 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 TOBACCO ABUSE 08/07/2008  . BACK PAIN, THORACIC REGION 07/07/2007  . ALLERGIC RHINITIS 06/02/2007    Past Surgical History:  Procedure Laterality Date  . ABDOMINAL  SURGERY    . ABDOMINAL WALL MESH  REMOVAL    . ABLATION ON ENDOMETRIOSIS    . CESAREAN SECTION     x2   . DILATION AND CURETTAGE OF UTERUS     x1   . ENDOMETRIAL ABLATION    . esophageal dilatation x 4    . GUM SURGERY    . HERNIA REPAIR     X3  . PARTIAL HYSTERECTOMY       OB History    Gravida  3   Para  2   Term    2   Preterm  0   AB  1   Living  2     SAB  1   TAB  0   Ectopic  0   Multiple  0   Live Births  1            Home Medications    Prior to Admission medications   Medication Sig Start Date End Date Taking? Authorizing Provider  acetaminophen (TYLENOL) 325 MG tablet Take by mouth every 6 (six) hours as needed for mild pain.    [provider]  clonazePAM (KLONOPIN) 1 MG tablet Take 0.05 mg by mouth 3 (three) times daily.     [provider]  escitalopram (LEXAPRO) 20 MG tablet Take 30 mg by mouth at bedtime.    [provider]  Iloperidone (FANAPT) 1 MG TABS Take 1 tablet by mouth daily.     [provider]  lurasidone (LATUDA) 80 MG TABS tablet Take 80 mg by mouth at bedtime.     [provider]  methocarbamol (ROBAXIN) 750 MG tablet Take 750 mg by mouth every 8 (eight) hours as needed for muscle spasms.    [provider]  methylPREDNISolone (MEDROL DOSEPAK) 4 MG TBPK tablet Take as directed on package 08/31/17   Duffy Bruce, MD  oxyCODONE-acetaminophen (PERCOCET) 7.5-325 MG tablet Take 1 tablet by mouth 4 (four) times daily.    [provider]    Family History Family History  Problem Relation Age of Onset  . Depression Mother   . Arthritis Mother   . Hyperlipidemia Father   . Hypertension Father     Social History Social History   Tobacco Use  . Smoking status: Current Every Day Smoker    Packs/day: 1.00    Types: Cigarettes  . Smokeless tobacco: Never Used  Substance Use Topics  . Alcohol use: No  . Drug use: No     Allergies   Lamictal [lamotrigine]; Morphine and related; Nicotine; Zofran; Chantix [varenicline tartrate]; Codeine; Haldol [haloperidol]; Ibuprofen; Lidoderm [lidocaine]; Metaxalone; Sulfa antibiotics; Toradol [ketorolac tromethamine]; Duloxetine; Gabapentin; Amoxicillin; Aripiprazole; and Penicillins   Review of Systems Review of Systems  All other systems reviewed  and are negative.    Physical Exam Updated Vital Signs BP (!) 164/125   Pulse 90   Temp 98.1 F (36.7 C) (Oral)   Resp 15   SpO2 98%   Physical Exam  Constitutional: She appears well-developed and well-nourished. No distress.  Non-toxic appearing  HENT:  Head: Normocephalic and atraumatic.  Right Ear: External ear normal.  Left Ear: External ear normal.  Neck: Normal range of motion. Neck supple. No spinous process tenderness present. No neck rigidity. Normal range of motion present.  Cardiovascular: Normal rate, regular rhythm, normal heart sounds and intact distal pulses.  No murmur heard. Pulses:      Radial pulses are 2+ on the right side, and 2+  on the left side.       Femoral pulses are 2+ on the right side, and 2+ on the left side.      Dorsalis pedis pulses are 2+ on the right side, and 2+ on the left side.       Posterior tibial pulses are 2+ on the right side, and 2+ on the left side.  Pulmonary/Chest: Effort normal and breath sounds normal. No respiratory distress.  Abdominal: Soft. Bowel sounds are normal. She exhibits no pulsatile midline mass. There is no tenderness. There is no rigidity, no rebound and no CVA tenderness.  Musculoskeletal:  Posterior and appearance appears normal. No evidence of obvious scoliosis or kyphosis. No obvious signs of skin changes, trauma, deformity, infection. No C, T, or L spine tenderness or step-offs to palpation. No C, T paraspinal tenderness.  Severe tenderness to light touch of the left lower back as well as superior gluteal muscles. Lung expansion normal.  Lower extremity strength reduced on the left side secondary to pain.  She has intact strength of the extensor hallucis longus. Patellar and Achilles deep tendon reflex 2+ and equal bilaterally. Sensation of lower extremities grossly intact.  Unable to tolerate straight leg raise.  Will not perform gait secondary to pain.  Lower extremity compartments soft. PT and DP 2+ b/l. Cap refill  <2 seconds.   Neurological: She is alert.  No foot drop  Skin: Skin is warm, dry and intact. Capillary refill takes less than 2 seconds. No rash noted. She is not diaphoretic. No erythema.  No vesicular-like rash noted.  Nursing note and vitals reviewed.    ED Treatments / Results  Labs (all labs ordered are listed, but only abnormal results are displayed) Labs Reviewed - No data to display  EKG None  Radiology No results found.  Procedures Procedures (including critical care time)  Medications Ordered in ED Medications  HYDROmorphone (DILAUDID) injection 1 mg (1 mg Intravenous Given 01/30/18 1852)  promethazine (PHENERGAN) injection 25 mg (25 mg Intravenous Given 01/30/18 1903)  dexamethasone (DECADRON) injection 10 mg (10 mg Intravenous Given 01/30/18 1852)  LORazepam (ATIVAN) tablet 1 mg (1 mg Oral Given 01/30/18 1917)     Initial Impression / Assessment and Plan / ED Course  I have reviewed the triage vital signs and the nursing notes.  Pertinent labs & imaging results that were available during my care of the patient were reviewed by me and considered in my medical decision making (see chart for details).    42 y.o. female with back pain.  Patient with severe tenderness palpation of the left lower back to light touch.  She has decreased strength secondary to pain but intact sensation to light touch and deep tendon reflexes.  Normal strength of the extensor hallucis longus of the left leg.  No bowel or bladder incontinence.  No urinary retention or saddle anesthesia.  No concern for cauda equina.  No history of trauma, personal history of cancer, night sweats or weight loss that would warrant a x-ray at this time.  No spinal injections, fever or IV drug use that make me concerned for spinal hematoma or abscess.  No pulsatile mass of the abdomen.  No abdominal tenderness to palpation or guarding to make me concern for intra-abdominal pathology.  No urinary symptoms or CVA tenderness  to make me concerned for cystitis versus pyelonephritis versus kidney stone.  Suspect patient's symptoms are musculoskeletal in nature.  Likely sciatica.  Happened after moving tables.  Patient requesting her "  back pain cocktail".  Will not ambulate in the department 2/2 pain.  Will give steroids, pain medication in the department and attempt to ambulate after pain control.  Patient able to stand and ambulate after pain medications.  Will discharge home. She is requesting d/c. She is to take home medications.  I advised the patient to follow-up with PCP this week. Specific return precautions discussed. Time was given for all questions to be answered. The patient verbalized understanding and agreement with plan. The patient appears safe for discharge home.  Final Clinical Impressions(s) / ED Diagnoses   Final diagnoses:  Chronic left-sided low back pain with left-sided sciatica    ED Discharge Orders    None       Lorelle Gibbs 01/30/18 2038    Quintella Reichert, MD 01/31/18 1452

## 2018-01-30 NOTE — ED Triage Notes (Signed)
Per EMS pt complaint of worsening low back pain radiating to legs while cleaning house; hx of chronic back pain. No relief with home medications.

## 2018-01-30 NOTE — Discharge Instructions (Signed)
You were seen here today for back pain.  You were given pain medication in the department  Please continue home medications.  Return for any weakness of the lower extremities, inability to walk, if you urinate or defecate on herself or cannot feel when you wipe.  To return if you have any fever or abdominal pain or other concerning symptoms. Please follow up with your primary care provider

## 2018-01-30 NOTE — ED Notes (Signed)
ED Provider at bedside. 

## 2018-02-25 ENCOUNTER — Encounter (HOSPITAL_COMMUNITY): Payer: Self-pay | Admitting: Emergency Medicine

## 2018-02-25 ENCOUNTER — Emergency Department (HOSPITAL_COMMUNITY)
Admission: EM | Admit: 2018-02-25 | Discharge: 2018-02-25 | Disposition: A | Discharge status: Home / Self Care | Payer: Medicare Other | Attending: Emergency Medicine | Admitting: Emergency Medicine

## 2018-02-25 DIAGNOSIS — M79605 Pain in left leg: Secondary | ICD-10-CM | POA: Insufficient documentation

## 2018-02-25 DIAGNOSIS — Z5321 Procedure and treatment not carried out due to patient leaving prior to being seen by health care provider: Secondary | ICD-10-CM | POA: Insufficient documentation

## 2018-02-25 DIAGNOSIS — R1084 Generalized abdominal pain: Secondary | ICD-10-CM | POA: Diagnosis not present

## 2018-02-25 DIAGNOSIS — M545 Low back pain: Secondary | ICD-10-CM | POA: Insufficient documentation

## 2018-02-25 NOTE — ED Notes (Signed)
Patient reports she removed her IV, took her home meds, and "feels better."

## 2018-02-25 NOTE — ED Triage Notes (Signed)
Per EMS report: pt from home, c/o LT sided low back/flank/LT leg pain hx sciatica.  Was given 169mcg fentanyl IV by ems.  Pt reported her robaxin, oxycodone, heating pads at home not helping.  Pt screamed/moaned to the point she vomited.  She is moaning/yelling in triage.

## 2018-03-09 DIAGNOSIS — R202 Paresthesia of skin: Secondary | ICD-10-CM | POA: Insufficient documentation

## 2018-03-31 ENCOUNTER — Other Ambulatory Visit: Payer: Self-pay | Admitting: Internal Medicine

## 2018-03-31 DIAGNOSIS — M5442 Lumbago with sciatica, left side: Principal | ICD-10-CM

## 2018-03-31 DIAGNOSIS — G8929 Other chronic pain: Secondary | ICD-10-CM

## 2018-04-02 ENCOUNTER — Ambulatory Visit
Admission: RE | Admit: 2018-04-02 | Discharge: 2018-04-02 | Disposition: A | Discharge status: Home / Self Care | Payer: Medicare Other | Source: Ambulatory Visit | Attending: Internal Medicine | Admitting: Internal Medicine

## 2018-04-02 DIAGNOSIS — M5442 Lumbago with sciatica, left side: Principal | ICD-10-CM

## 2018-04-02 DIAGNOSIS — G8929 Other chronic pain: Secondary | ICD-10-CM

## 2018-04-04 ENCOUNTER — Other Ambulatory Visit: Payer: Self-pay | Admitting: Internal Medicine

## 2018-07-14 ENCOUNTER — Emergency Department (HOSPITAL_COMMUNITY): Payer: Medicare Other

## 2018-07-14 ENCOUNTER — Encounter (HOSPITAL_COMMUNITY): Payer: Self-pay | Admitting: Nurse Practitioner

## 2018-07-14 ENCOUNTER — Emergency Department (HOSPITAL_COMMUNITY)
Admission: EM | Admit: 2018-07-14 | Discharge: 2018-07-14 | Disposition: A | Discharge status: Home / Self Care | Payer: Medicare Other | Attending: Emergency Medicine | Admitting: Emergency Medicine

## 2018-07-14 DIAGNOSIS — F319 Bipolar disorder, unspecified: Secondary | ICD-10-CM | POA: Insufficient documentation

## 2018-07-14 DIAGNOSIS — F17210 Nicotine dependence, cigarettes, uncomplicated: Secondary | ICD-10-CM | POA: Diagnosis not present

## 2018-07-14 DIAGNOSIS — W07XXXA Fall from chair, initial encounter: Secondary | ICD-10-CM | POA: Insufficient documentation

## 2018-07-14 DIAGNOSIS — Z79899 Other long term (current) drug therapy: Secondary | ICD-10-CM | POA: Insufficient documentation

## 2018-07-14 DIAGNOSIS — F419 Anxiety disorder, unspecified: Secondary | ICD-10-CM | POA: Diagnosis not present

## 2018-07-14 DIAGNOSIS — M549 Dorsalgia, unspecified: Secondary | ICD-10-CM | POA: Diagnosis present

## 2018-07-14 DIAGNOSIS — M5442 Lumbago with sciatica, left side: Secondary | ICD-10-CM | POA: Insufficient documentation

## 2018-07-14 DIAGNOSIS — G8929 Other chronic pain: Secondary | ICD-10-CM

## 2018-07-14 DIAGNOSIS — W19XXXA Unspecified fall, initial encounter: Secondary | ICD-10-CM

## 2018-07-14 LAB — I-STAT BETA HCG BLOOD, ED (MC, WL, AP ONLY)

## 2018-07-14 MED ORDER — METHOCARBAMOL 500 MG PO TABS
500.0000 mg | ORAL_TABLET | Freq: Once | ORAL | Status: AC
  Administered 2018-07-14: 500 mg via ORAL
  Filled 2018-07-14: qty 1

## 2018-07-14 MED ORDER — DEXAMETHASONE SODIUM PHOSPHATE 10 MG/ML IJ SOLN
10.0000 mg | Freq: Once | INTRAMUSCULAR | Status: AC
Start: 2018-07-14 — End: 2018-07-14
  Administered 2018-07-14: 10 mg via INTRAVENOUS
  Filled 2018-07-14: qty 1

## 2018-07-14 MED ORDER — LORAZEPAM 2 MG/ML IJ SOLN
1.0000 mg | Freq: Once | INTRAMUSCULAR | Status: AC
  Administered 2018-07-14: 1 mg via INTRAVENOUS
  Filled 2018-07-14: qty 1

## 2018-07-14 MED ORDER — HYDROMORPHONE HCL 1 MG/ML IJ SOLN
1.0000 mg | Freq: Once | INTRAMUSCULAR | Status: AC
  Administered 2018-07-14: 1 mg via INTRAVENOUS
  Filled 2018-07-14: qty 1

## 2018-07-14 MED ORDER — PROMETHAZINE HCL 25 MG/ML IJ SOLN
25.0000 mg | Freq: Once | INTRAMUSCULAR | Status: AC
  Administered 2018-07-14: 25 mg via INTRAVENOUS
  Filled 2018-07-14: qty 1

## 2018-07-14 NOTE — ED Provider Notes (Signed)
[facility] DEPT Provider Note   CSN: [id number] Arrival date & time: 07/14/18  1515     History   Chief Complaint Chief Complaint  Patient presents with  . Tailbone Pain  . Fall    HPI Gina [name] is a 42 y.o. female who presents with back pain/left hip and leg pain. PMH significant for chronic back pain, sciatica, fibromyalgia, anxiety, bipolar d/o. She states that she was "doing things she wasn't supposed to do" with her back a couple days ago and it's been hurting more recently. Usually she will take her home meds and use a heating pad and she can manage. She's been limping and using an office chair to get around in her home and she had a mechanical fall earlier today and fell on to her left side. Since then she has had severe pain over the lateral hip which radiates down to her foot. It is a constant severe pain. She has been screaming and vomiting due to the pain. She is concerned something is "torn" such as a muscle. She reports associated spasming as well. She states she gets injections in her back frequently which does help but hasn't had one in a long time. She had a MRI of her back on 6/2 which did not show disc herniation. No fever, syncope, trauma, unexplained weight loss, hx of cancer, loss of bowel/bladder function, saddle anesthesia, urinary retention, IVDU. No abdominal pain, diarrhea, urinary symptoms.  HPI  Past Medical History:  Diagnosis Date  . ABDOMINAL WALL HERNIA 02/27/2010  . Abnormal Pap smear   . ALLERGIC RHINITIS 06/02/2007  . ANOREXIA, CHRONIC 08/07/2008  . ANXIETY 09/15/2010  . BACK PAIN, THORACIC REGION 07/07/2007  . Bipolar disorder ([place])   . CERVICAL RADICULOPATHY 12/11/2008  . Condyloma acuminatum 04/23/2009  . CONSTIPATION 09/15/2010  . DYSPHAGIA UNSPECIFIED 09/09/2009  . 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 ENDOMETRIOSIS 12/16/2009  . FATIGUE 06/11/2010  . Fibromyalgia   . GERD 10/23/2009  . Heart palpitations   . HERPES  SIMPLEX INFECTION 06/11/2010  . LENTIGO 04/23/2009  . LUNG NODULE 06/17/2010  . Narcotic abuse, continuous ([place])   . Ovarian cyst   . Palpitations 10/23/2009  . Panic disorder   . Rheumatoid arthritis(714.0)   . Stricture and stenosis of esophagus 05/13/2010  . TOBACCO ABUSE 08/07/2008  . Urinary tract infection     Patient Active Problem List   Diagnosis Date Noted  . Bipolar 1 disorder, mixed, moderate (HCC) 07/15/2015    Class: Chronic  . 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 Sciatica 01/09/2015  . Reactive hypoglycemia 12/17/2013  . Musculoskeletal malfunction arising from mental factors 04/04/2013  . Incisional hernia 11/15/2012  . Other postprocedural status(V45.89) 11/15/2012  . Abdominal pain 11/11/2012  . Chronic pain syndrome 11/11/2012  . Disorder of sacrum 11/11/2012  . Heartburn 10/04/2011  . DYSPHAGIA 10/04/2011  . Depression with anxiety 09/15/2010  . CONSTIPATION 09/15/2010  . LUNG NODULE 06/17/2010  . HERPES SIMPLEX INFECTION 06/11/2010  . FATIGUE 06/11/2010  . STRICTURE AND STENOSIS OF ESOPHAGUS 05/13/2010  . ABDOMINAL WALL HERNIA 02/27/2010  . ENDOMETRIOSIS 12/16/2009  . GERD 10/23/2009  . PALPITATIONS 10/23/2009  . 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 CONDYLOMA ACUMINATUM 04/23/2009  . LENTIGO 04/23/2009  . DENTAL PAIN 03/19/2009  . CERVICAL RADICULOPATHY 12/11/2008  . 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 TOBACCO ABUSE 08/07/2008  . BACK PAIN, THORACIC REGION 07/07/2007  . ALLERGIC RHINITIS 06/02/2007  Past Surgical History:  Procedure Laterality Date  . ABDOMINAL SURGERY    . ABDOMINAL WALL MESH  REMOVAL    . ABLATION ON ENDOMETRIOSIS    . CESAREAN SECTION     x2   . DILATION AND CURETTAGE OF UTERUS     x1   . ENDOMETRIAL ABLATION    . esophageal dilatation x 4    . GUM SURGERY    . HERNIA REPAIR     X3  . PARTIAL HYSTERECTOMY       OB History    Gravida  3    Para  2   Term  2   Preterm  0   AB  1   Living  2     SAB  1   TAB  0   Ectopic  0   Multiple  0   Live Births  1            Home Medications    Prior to Admission medications   Medication Sig Start Date End Date Taking? Authorizing Provider  clonazePAM (KLONOPIN) 1 MG tablet Take 0.05 mg by mouth 2 (two) times daily.     [provider]  escitalopram (LEXAPRO) 20 MG tablet Take 30 mg by mouth at bedtime.    [provider]  lurasidone (LATUDA) 80 MG TABS tablet Take 80 mg by mouth at bedtime.     [provider]  methocarbamol (ROBAXIN) 750 MG tablet Take 750 mg by mouth every 8 (eight) hours as needed for muscle spasms.    [provider]  methylPREDNISolone (MEDROL DOSEPAK) 4 MG TBPK tablet Take as directed on package Patient not taking: Reported on 01/30/2018 08/31/17   Duffy Bruce, MD  oxyCODONE-acetaminophen (PERCOCET) 7.5-325 MG tablet Take 1 tablet by mouth 4 (four) times daily.    [provider]    Family History Family History  Problem Relation Age of Onset  . Depression Mother   . Arthritis Mother   . Hyperlipidemia Father   . Hypertension Father     Social History Social History   Tobacco Use  . Smoking status: Current Every Day Smoker    Packs/day: 1.00    Types: Cigarettes  . Smokeless tobacco: Never Used  Substance Use Topics  . Alcohol use: No  . Drug use: No     Allergies   Lamictal [lamotrigine]; Morphine and related; Nicotine; Zofran; Chantix [varenicline tartrate]; Codeine; Haldol [haloperidol]; Ibuprofen; Lidoderm [lidocaine]; Metaxalone; Sulfa antibiotics; Toradol [ketorolac tromethamine]; Duloxetine; Gabapentin; Amoxicillin; Aripiprazole; and Penicillins   Review of Systems Review of Systems  Constitutional: Negative for fever.  Respiratory: Negative for shortness of breath.   Cardiovascular: Negative for chest pain.  Gastrointestinal: Positive for nausea and vomiting.  Negative for abdominal pain.  Musculoskeletal: Positive for back pain and myalgias.  Neurological: Positive for numbness. Negative for weakness.  All other systems reviewed and are negative.    Physical Exam Updated Vital Signs BP 93/81 (BP Location: Left Arm)   Pulse 67   Temp 97.8 F (36.6 C) (Oral)   Resp 15   SpO2 100%   Physical Exam  Constitutional: She is oriented to person, place, and time. She appears well-developed and well-nourished. She appears distressed (screaming, crying, vomiting).  HENT:  Head: Normocephalic and atraumatic.  Eyes: Pupils are equal, round, and reactive to light. Conjunctivae are normal. Right eye exhibits no discharge. Left eye exhibits no discharge. No scleral icterus.  Neck: Normal range of motion.  Cardiovascular: Normal rate  and regular rhythm.  Pulmonary/Chest: Effort normal and breath sounds normal. No respiratory distress.  Abdominal: Soft. Bowel sounds are normal. She exhibits no distension. There is no tenderness.  Musculoskeletal:  Back: Unable to adequately examine due to pain. Tender over the left buttocks. 2+ DP pulse. FROM of hip and knee.  Neurological: She is alert and oriented to person, place, and time.  Skin: Skin is warm and dry.  Psychiatric: She has a normal mood and affect. Her behavior is normal.  Nursing note and vitals reviewed.    ED Treatments / Results  Labs (all labs ordered are listed, but only abnormal results are displayed) Labs Reviewed - No data to display  EKG None  Radiology Dg Hip Unilat W Or Wo Pelvis 2-3 Views Left  Result Date: 07/14/2018 CLINICAL DATA:  [place].  Sacral coccyx pain. EXAM: DG HIP (WITH OR WITHOUT PELVIS) 2-3V LEFT COMPARISON:  None. FINDINGS: Both hips are normally located. No hip fracture or plain film evidence of AVN. The pubic symphysis and SI joints are intact. No pelvic fractures are identified. No bone lesions. IMPRESSION: No acute bony findings. Electronically Signed   By: Marijo Sanes M.D.   On: 07/14/2018 16:54    Procedures Procedures (including critical care time)  Medications Ordered in ED Medications  HYDROmorphone (DILAUDID) injection 1 mg (1 mg Intravenous Given 07/14/18 1608)  LORazepam (ATIVAN) injection 1 mg (1 mg Intravenous Given 07/14/18 1603)  promethazine (PHENERGAN) injection 25 mg (25 mg Intravenous Given 07/14/18 1603)  dexamethasone (DECADRON) injection 10 mg (10 mg Intravenous Given 07/14/18 1602)  HYDROmorphone (DILAUDID) injection 1 mg (1 mg Intravenous Given 07/14/18 1725)  methocarbamol (ROBAXIN) tablet 500 mg (500 mg Oral Given 07/14/18 1725)     Initial Impression / Assessment and Plan / ED Course  I have reviewed the triage vital signs and the nursing notes.  Pertinent labs & imaging results that were available during my care of the patient were reviewed by me and considered in my medical decision making (see chart for details).  42 year old female with acute on chronic L hip/leg pain after a fall earlier today. She has a well documented history of the same and goes to pain management. She is very distressed on initial exam and is vomiting which she states is due to pain. Will give pain control and obtain xray of the L hip.  Pt is back from Xray. She states spasms are improved and she is asking for something to eat. Her pain is still severe. Will give her one more dose of dialudid.   Reviewed imaging with patient. She feels much better and tolerated PO. She is ready for d/c and will f/u with her doctor for management of her chronic pain.  Final Clinical Impressions(s) / ED Diagnoses   Final diagnoses:  Fall, initial encounter  Chronic left-sided low back pain with left-sided sciatica    ED Discharge Orders    None       Recardo Evangelist, PA-C 07/14/18 Debroah Baller, MD 07/15/18 [phone]

## 2018-07-14 NOTE — ED Notes (Signed)
Bed: JG94 Expected date:  Expected time:  Means of arrival:  Comments: 42 yo fall, tailbone pain

## 2018-07-14 NOTE — ED Triage Notes (Signed)
Pt is c/o excruciating tailbone pain secondary to a fall from a standing position. Also c/o unrelated nausea requesting phenergan.

## 2018-07-21 ENCOUNTER — Emergency Department (HOSPITAL_COMMUNITY)
Admission: EM | Admit: 2018-07-21 | Discharge: 2018-07-22 | Discharge status: Against Medical Advice | Payer: Medicare Other | Attending: Emergency Medicine | Admitting: Emergency Medicine

## 2018-07-21 ENCOUNTER — Encounter (HOSPITAL_COMMUNITY): Payer: Self-pay | Admitting: Emergency Medicine

## 2018-07-21 DIAGNOSIS — Z5321 Procedure and treatment not carried out due to patient leaving prior to being seen by health care provider: Secondary | ICD-10-CM | POA: Insufficient documentation

## 2018-07-21 DIAGNOSIS — M545 Low back pain: Secondary | ICD-10-CM | POA: Diagnosis not present

## 2018-07-21 NOTE — ED Triage Notes (Signed)
Pt reports L back pain, sciatica since last week, reports seen at [facility] for same on Friday. Reports she's due for steroid shots at neurology office. EMS initiated IV access and gave 25mcg fentanyl. Pt screaming in pain in hall bed at this time. Reports she has been vomiting with pain. Screaming at RN that "I shouldn't be in triage, I can't walk, I can't wait 6 or 7 hours."

## 2018-07-22 NOTE — ED Notes (Signed)
Pt stopped screaming when husband arrived to bedside.

## 2018-07-22 NOTE — ED Notes (Signed)
Pt states she want to leave, unable to redirect pt to stay. EMT removed EMS IV. LWBS after triage.

## 2018-08-03 ENCOUNTER — Emergency Department (HOSPITAL_COMMUNITY)
Admission: EM | Admit: 2018-08-03 | Discharge: 2018-08-03 | Disposition: A | Discharge status: Home / Self Care | Payer: Medicare Other | Attending: Emergency Medicine | Admitting: Emergency Medicine

## 2018-08-03 DIAGNOSIS — F17210 Nicotine dependence, cigarettes, uncomplicated: Secondary | ICD-10-CM | POA: Diagnosis not present

## 2018-08-03 DIAGNOSIS — G5702 Lesion of sciatic nerve, left lower limb: Secondary | ICD-10-CM | POA: Diagnosis not present

## 2018-08-03 DIAGNOSIS — M545 Low back pain: Secondary | ICD-10-CM | POA: Diagnosis present

## 2018-08-03 MED ORDER — PROMETHAZINE HCL 25 MG/ML IJ SOLN
12.5000 mg | Freq: Once | INTRAMUSCULAR | Status: AC
  Administered 2018-08-03: 12.5 mg via INTRAMUSCULAR
  Filled 2018-08-03: qty 1

## 2018-08-03 MED ORDER — ACETAMINOPHEN 500 MG PO TABS
1000.0000 mg | ORAL_TABLET | Freq: Once | ORAL | Status: DC
  Filled 2018-08-03: qty 2

## 2018-08-03 MED ORDER — KETOROLAC TROMETHAMINE 60 MG/2ML IM SOLN
15.0000 mg | Freq: Once | INTRAMUSCULAR | Status: AC
  Administered 2018-08-03: 15 mg via INTRAMUSCULAR
  Filled 2018-08-03: qty 2

## 2018-08-03 MED ORDER — KETAMINE HCL 50 MG/5ML IJ SOSY
10.0000 mg | PREFILLED_SYRINGE | Freq: Once | INTRAMUSCULAR | Status: AC
  Administered 2018-08-03: 10 mg via INTRAVENOUS
  Filled 2018-08-03: qty 5

## 2018-08-03 MED ORDER — OXYCODONE HCL 5 MG PO TABS
10.0000 mg | ORAL_TABLET | Freq: Once | ORAL | Status: DC
  Filled 2018-08-03: qty 2

## 2018-08-03 MED ORDER — DIAZEPAM 5 MG PO TABS
5.0000 mg | ORAL_TABLET | Freq: Once | ORAL | Status: DC
  Filled 2018-08-03: qty 1

## 2018-08-03 NOTE — ED Triage Notes (Signed)
Per EMS, pt coming home with complaints of back pain on the left side. Pt states that the pain feels exactly like her sciatica pain. Pt reports falling to the floor in response to the pain. Pt reports pain intensifying an hour ago. Pt reports n/v. EMS administered 200 mcg fentanyl iv. EMS states that pt does not have shortening or rotation to the left leg. Pt has a hx of sciatica pain.

## 2018-08-03 NOTE — ED Provider Notes (Signed)
[facility] DEPT Provider Note   CSN: [id number] Arrival date & time: 08/03/18  1600     History   Chief Complaint Chief Complaint  Patient presents with  . Back Pain    HPI Gina [name] is a 42 y.o. female.  42 yo F with a chief complaint of left-sided low back pain.  This is a chronic issue for her and feels just like it has previously.  It starts in her low back and radiates all the way down the leg.  She denies any new areas of pain or any change of the pain.  A couple hours ago she had sudden pain to the area and she collapsed to the ground.  She felt that that may be made her pain worse.  She denies any injury in the fall.  She denies loss of bowel or bladder denies fevers denies recent injection.  She is actually scheduled for an injection tomorrow and she would like her "cocktail "so that she can make it until tomorrow.  She told me that she has lots of allergies and that he look in the system what she can actually get.  The history is provided by the patient.  Back Pain   This is a chronic problem. The current episode started 3 to 5 hours ago. The problem occurs constantly. The problem has not changed since onset.The pain is associated with falling. The pain is present in the lumbar spine. The quality of the pain is described as stabbing and shooting. The pain radiates to the left foot. The pain is at a severity of 10/10. The pain is severe. The symptoms are aggravated by bending. The pain is worse during the day. Pertinent negatives include no chest pain, no fever, no headaches and no dysuria. She has tried nothing for the symptoms. The treatment provided no relief.    Past Medical History:  Diagnosis Date  . ABDOMINAL WALL HERNIA 02/27/2010  . Abnormal Pap smear   . ALLERGIC RHINITIS 06/02/2007  . ANOREXIA, CHRONIC 08/07/2008  . ANXIETY 09/15/2010  . BACK PAIN, THORACIC REGION 07/07/2007  . Bipolar disorder ([place])   . CERVICAL RADICULOPATHY  12/11/2008  . Condyloma acuminatum 04/23/2009  . CONSTIPATION 09/15/2010  . DYSPHAGIA UNSPECIFIED 09/09/2009  . 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 ENDOMETRIOSIS 12/16/2009  . FATIGUE 06/11/2010  . Fibromyalgia   . GERD 10/23/2009  . Heart palpitations   . HERPES SIMPLEX INFECTION 06/11/2010  . LENTIGO 04/23/2009  . LUNG NODULE 06/17/2010  . Narcotic abuse, continuous ([place])   . Ovarian cyst   . Palpitations 10/23/2009  . Panic disorder   . Rheumatoid arthritis(714.0)   . Stricture and stenosis of esophagus 05/13/2010  . TOBACCO ABUSE 08/07/2008  . Urinary tract infection     Patient Active Problem List   Diagnosis Date Noted  . Bipolar 1 disorder, mixed, moderate (HCC) 07/15/2015    Class: Chronic  . 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 Sciatica 01/09/2015  . Reactive hypoglycemia 12/17/2013  . Musculoskeletal malfunction arising from mental factors 04/04/2013  . Incisional hernia 11/15/2012  . Other postprocedural status(V45.89) 11/15/2012  . Abdominal pain 11/11/2012  . Chronic pain syndrome 11/11/2012  . Disorder of sacrum 11/11/2012  . Heartburn 10/04/2011  . DYSPHAGIA 10/04/2011  . Depression with anxiety 09/15/2010  . CONSTIPATION 09/15/2010  . LUNG NODULE 06/17/2010  . HERPES SIMPLEX INFECTION 06/11/2010  . FATIGUE 06/11/2010  . STRICTURE AND STENOSIS OF ESOPHAGUS 05/13/2010  . ABDOMINAL WALL HERNIA  02/27/2010  . ENDOMETRIOSIS 12/16/2009  . GERD 10/23/2009  . PALPITATIONS 10/23/2009  . 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 CONDYLOMA ACUMINATUM 04/23/2009  . LENTIGO 04/23/2009  . DENTAL PAIN 03/19/2009  . CERVICAL RADICULOPATHY 12/11/2008  . 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 TOBACCO ABUSE 08/07/2008  . BACK PAIN, THORACIC REGION 07/07/2007  . ALLERGIC RHINITIS 06/02/2007    Past Surgical History:  Procedure Laterality Date  . ABDOMINAL SURGERY    . ABDOMINAL WALL MESH  REMOVAL    .  ABLATION ON ENDOMETRIOSIS    . CESAREAN SECTION     x2   . DILATION AND CURETTAGE OF UTERUS     x1   . ENDOMETRIAL ABLATION    . esophageal dilatation x 4    . GUM SURGERY    . HERNIA REPAIR     X3  . PARTIAL HYSTERECTOMY       OB History    Gravida  3   Para  2   Term  2   Preterm  0   AB  1   Living  2     SAB  1   TAB  0   Ectopic  0   Multiple  0   Live Births  1            Home Medications    Prior to Admission medications   Medication Sig Start Date End Date Taking? Authorizing Provider  clonazePAM (KLONOPIN) 1 MG tablet Take 0.05 mg by mouth 2 (two) times daily.     [provider]  escitalopram (LEXAPRO) 20 MG tablet Take 30 mg by mouth at bedtime.    [provider]  lurasidone (LATUDA) 80 MG TABS tablet Take 80 mg by mouth at bedtime.     [provider]  methocarbamol (ROBAXIN) 750 MG tablet Take 750 mg by mouth every 8 (eight) hours as needed for muscle spasms.    [provider]  methylPREDNISolone (MEDROL DOSEPAK) 4 MG TBPK tablet Take as directed on package Patient not taking: Reported on 01/30/2018 08/31/17   Duffy Bruce, MD  oxyCODONE-acetaminophen (PERCOCET) 7.5-325 MG tablet Take 1 tablet by mouth 4 (four) times daily.    [provider]    Family History Family History  Problem Relation Age of Onset  . Depression Mother   . Arthritis Mother   . Hyperlipidemia Father   . Hypertension Father     Social History Social History   Tobacco Use  . Smoking status: Current Every Day Smoker    Packs/day: 1.00    Types: Cigarettes  . Smokeless tobacco: Never Used  Substance Use Topics  . Alcohol use: No  . Drug use: No     Allergies   Lamictal [lamotrigine]; Morphine and related; Nicotine; Zofran; Chantix [varenicline tartrate]; Codeine; Haldol [haloperidol]; Ibuprofen; Lidoderm [lidocaine]; Metaxalone; Sulfa antibiotics; Toradol [ketorolac tromethamine]; Duloxetine; Gabapentin;  Amoxicillin; Aripiprazole; and Penicillins   Review of Systems Review of Systems  Constitutional: Negative for chills and fever.  HENT: Negative for congestion and rhinorrhea.   Eyes: Negative for redness and visual disturbance.  Respiratory: Negative for shortness of breath and wheezing.   Cardiovascular: Negative for chest pain and palpitations.  Gastrointestinal: Negative for nausea and vomiting.  Genitourinary: Negative for dysuria and urgency.  Musculoskeletal: Positive for arthralgias, back pain and myalgias.  Skin: Negative for pallor and wound.  Neurological: Negative for dizziness and headaches.     Physical Exam Updated  Vital Signs BP 103/82 (BP Location: Right Arm)   Pulse 88   Temp 99 F (37.2 C) (Oral)   Resp 20   SpO2 98%   Physical Exam  Constitutional: She is oriented to person, place, and time. She appears well-developed and well-nourished. No distress.  HENT:  Head: Normocephalic and atraumatic.  Eyes: Pupils are equal, round, and reactive to light. EOM are normal.  Neck: Normal range of motion. Neck supple.  Cardiovascular: Normal rate and regular rhythm. Exam reveals no gallop and no friction rub.  No murmur heard. Pulmonary/Chest: Effort normal. She has no wheezes. She has no rales.  Abdominal: Soft. She exhibits no distension and no mass. There is no tenderness. There is no guarding.  Musculoskeletal: She exhibits tenderness. She exhibits no edema.  Left piriformis muscle tenderness.  Reproduces pain.   PMS intact distally.  Reflexes are 2+.  No clonus.  Neurological: She is alert and oriented to person, place, and time.  Skin: Skin is warm and dry. She is not diaphoretic.  Psychiatric: She has a normal mood and affect. Her behavior is normal.  Nursing note and vitals reviewed.    ED Treatments / Results  Labs (all labs ordered are listed, but only abnormal results are displayed) Labs Reviewed - No data to display  EKG None  Radiology No  results found.  Procedures Procedures (including critical care time)  Medications Ordered in ED Medications  acetaminophen (TYLENOL) tablet 1,000 mg (has no administration in time range)  ketorolac (TORADOL) injection 15 mg (has no administration in time range)  promethazine (PHENERGAN) injection 12.5 mg (has no administration in time range)  diazepam (VALIUM) tablet 5 mg (has no administration in time range)  oxyCODONE (Oxy IR/ROXICODONE) immediate release tablet 10 mg (has no administration in time range)  ketamine 50 mg in normal saline 5 mL (10 mg/mL) syringe (has no administration in time range)     Initial Impression / Assessment and Plan / ED Course  I have reviewed the triage vital signs and the nursing notes.  Pertinent labs & imaging results that were available during my care of the patient were reviewed by me and considered in my medical decision making (see chart for details).     42 yo F with a cc of left sided low back pain.  Chronic issue for her.  Sees pain management, sees neuro.  Scheduled for injection tomorrow.  The patient has no red flags other than trauma though she does not want imaging at this time.  Her pain is mostly along the piriformis muscle belly.  I suspect that she has piriformis syndrome.  We will give her some pain relief here and will have her follow-up tomorrow for her scheduled injection.  5:36 PM:  I have discussed the diagnosis/risks/treatment options with the patient and believe the pt to be eligible for discharge home to follow-up with PCP. We also discussed returning to the ED immediately if new or worsening sx occur. We discussed the sx which are most concerning (e.g., sudden worsening pain, fever, inability to tolerate by mouth) that necessitate immediate return. Medications administered to the patient during their visit and any new prescriptions provided to the patient are listed below.  Medications given during this visit Medications    acetaminophen (TYLENOL) tablet 1,000 mg (has no administration in time range)  ketorolac (TORADOL) injection 15 mg (has no administration in time range)  promethazine (PHENERGAN) injection 12.5 mg (has no administration in time range)  diazepam (VALIUM) tablet  5 mg (has no administration in time range)  oxyCODONE (Oxy IR/ROXICODONE) immediate release tablet 10 mg (has no administration in time range)  ketamine 50 mg in normal saline 5 mL (10 mg/mL) syringe (has no administration in time range)      The patient appears reasonably screen and/or stabilized for discharge and I doubt any other medical condition or other [facility] requiring further screening, evaluation, or treatment in the ED at this time prior to discharge.    Final Clinical Impressions(s) / ED Diagnoses   Final diagnoses:  Piriformis syndrome of left side    ED Discharge Orders    None       Deno Etienne, DO 08/03/18 1736

## 2018-08-03 NOTE — ED Notes (Signed)
Dr Tyrone Nine at bedside. Pt continues to yell out in pain. She states this is the same as her previous sciatica episodes.  Pt states she needs phenergan for vomiting, no vomiting seen at this time or since patient has been here.

## 2018-08-03 NOTE — ED Notes (Signed)
No wanting to go home, explained to patient she needs to wait until her husband arrives to take her home since she has medications on board. Pt denies pain at this time

## 2018-08-22 ENCOUNTER — Emergency Department (HOSPITAL_COMMUNITY)
Admission: EM | Admit: 2018-08-22 | Discharge: 2018-08-22 | Disposition: A | Discharge status: Home / Self Care | Payer: Medicare Other | Attending: Emergency Medicine | Admitting: Emergency Medicine

## 2018-08-22 DIAGNOSIS — M545 Low back pain: Secondary | ICD-10-CM | POA: Diagnosis present

## 2018-08-22 DIAGNOSIS — Z5321 Procedure and treatment not carried out due to patient leaving prior to being seen by health care provider: Secondary | ICD-10-CM | POA: Diagnosis not present

## 2018-08-22 NOTE — ED Notes (Signed)
Patient screaming out randomly, stating she's in so much pain.

## 2018-08-22 NOTE — ED Notes (Addendum)
Patient ambulated to [facility] with visitor with minimal assistance. Patient requested for IV to be taken out so that she could leave. Colletta Maryland, RN made aware.

## 2018-08-22 NOTE — ED Notes (Signed)
Patient resting at this time, will update vital signs once patient wakes up.

## 2018-08-22 NOTE — ED Notes (Signed)
Bed: [facility] Expected date:  Expected time:  Means of arrival:  Comments: EMS 42 yo female lower back pain-chronic back pain-unable to ambulate/Fentanyl 50 mcg

## 2018-08-22 NOTE — ED Triage Notes (Signed)
Pt BIB [facility] from home. [place] and stated that her back pain increased since then. Pt stated that she has gotten injections for this back pain in the past. She stated that they normally last for 8 months. The last injection was 3 months ago. Pain originates at her lower back and travels down her legs bilaterally more on the left then the right. No loss of bowel or bladder.

## 2018-08-22 NOTE — ED Notes (Signed)
Patient yelling out that she's in pain and apologizing for being "so loud" in the hallway.

## 2018-08-22 NOTE — ED Notes (Signed)
No loss of bowel or bladder. Pt stated that she cannot move her left leg. Good pulses and sensory in tact.

## 2018-08-22 NOTE — ED Notes (Signed)
Pt stated that she wanted to leave. Leaving without being seen was explained to the pt. She stated " I dont care I feel fine, that fentanyl that the paramedics gave worked." Pt was able to ambulate. IV was removed before pt removed it herself (she was actively trying to pull it out during the above conversation).

## 2018-09-18 ENCOUNTER — Emergency Department (HOSPITAL_COMMUNITY): Payer: Medicare Other

## 2018-09-18 ENCOUNTER — Emergency Department (HOSPITAL_COMMUNITY)
Admission: EM | Admit: 2018-09-18 | Discharge: 2018-09-18 | Disposition: A | Discharge status: Home / Self Care | Payer: Medicare Other | Attending: Emergency Medicine | Admitting: Emergency Medicine

## 2018-09-18 DIAGNOSIS — M5442 Lumbago with sciatica, left side: Secondary | ICD-10-CM | POA: Insufficient documentation

## 2018-09-18 DIAGNOSIS — Z79899 Other long term (current) drug therapy: Secondary | ICD-10-CM | POA: Insufficient documentation

## 2018-09-18 DIAGNOSIS — F17210 Nicotine dependence, cigarettes, uncomplicated: Secondary | ICD-10-CM | POA: Insufficient documentation

## 2018-09-18 DIAGNOSIS — W19XXXA Unspecified fall, initial encounter: Secondary | ICD-10-CM

## 2018-09-18 DIAGNOSIS — M545 Low back pain: Secondary | ICD-10-CM | POA: Diagnosis present

## 2018-09-18 DIAGNOSIS — W01198A Fall on same level from slipping, tripping and stumbling with subsequent striking against other object, initial encounter: Secondary | ICD-10-CM | POA: Insufficient documentation

## 2018-09-18 MED ORDER — OXYCODONE-ACETAMINOPHEN 5-325 MG PO TABS
2.0000 | ORAL_TABLET | Freq: Once | ORAL | Status: DC
  Filled 2018-09-18: qty 2

## 2018-09-18 MED ORDER — PROMETHAZINE HCL 25 MG/ML IJ SOLN
12.5000 mg | Freq: Once | INTRAMUSCULAR | Status: AC
  Administered 2018-09-18: 12.5 mg via INTRAVENOUS
  Filled 2018-09-18: qty 1

## 2018-09-18 MED ORDER — LIDOCAINE 5 % EX PTCH
2.0000 | MEDICATED_PATCH | CUTANEOUS | Status: DC
  Administered 2018-09-18: 2 via TRANSDERMAL
  Filled 2018-09-18: qty 2

## 2018-09-18 MED ORDER — SODIUM CHLORIDE 0.9 % IV SOLN
INTRAVENOUS | Status: DC | PRN
  Administered 2018-09-18: 500 mL via INTRAVENOUS

## 2018-09-18 MED ORDER — KETOROLAC TROMETHAMINE 30 MG/ML IJ SOLN
30.0000 mg | Freq: Once | INTRAMUSCULAR | Status: AC
  Administered 2018-09-18: 30 mg via INTRAVENOUS
  Filled 2018-09-18: qty 1

## 2018-09-18 MED ORDER — METHYLPREDNISOLONE SODIUM SUCC 125 MG IJ SOLR: 125.0000 mg | Freq: Once | INTRAMUSCULAR | Status: DC

## 2018-09-18 MED ORDER — METHOCARBAMOL 1000 MG/10ML IJ SOLN
500.0000 mg | Freq: Once | INTRAVENOUS | Status: AC
  Administered 2018-09-18: 500 mg via INTRAVENOUS
  Filled 2018-09-18: qty 5
  Filled 2018-09-18: qty 500

## 2018-09-18 MED ORDER — DEXAMETHASONE SODIUM PHOSPHATE 10 MG/ML IJ SOLN
10.0000 mg | Freq: Once | INTRAMUSCULAR | Status: AC
  Administered 2018-09-18: 10 mg via INTRAVENOUS
  Filled 2018-09-18: qty 1

## 2018-09-18 NOTE — ED Notes (Signed)
Bed: [facility] Expected date:  Expected time:  Means of arrival:  Comments: EMS- Fall, back pain

## 2018-09-18 NOTE — Discharge Instructions (Signed)
Your x-rays show no evidence of fracture from your fall today, please continue taking your home medications as prescribed by your primary care doctor to treat her chronic sciatica.  Return to the emergency department if you have loss of control of your bowels or bladder, new or change numbness or weakness in your legs or any other new or concerning symptoms.

## 2018-09-18 NOTE — ED Triage Notes (Signed)
Pt BIB EMS from home.  Hx of sciatica, fibromyalgia.  She was changing shower curtain and fell into tub, EMS arrived pt was on right side, complaining of lower/ mid back pain and left hip pain.  Pt reports these are chronic pain issues.    EMS gave 200 mcg fentanyl en route.   VS- HR 110, BP 131/94, 97% RA, 16 R.

## 2018-09-18 NOTE — ED Provider Notes (Signed)
[facility] DEPT Provider Note   CSN: [id number] Arrival date & time: 09/18/18  1312     History   Chief Complaint Chief Complaint  Patient presents with  . Fall    HPI Gina [name] is a 42 y.o. female.  Gina [name] is a 42 y.o. Female with a history of chronic back pain and sciatica, fibromyalgia, toyed arthritis, who presents to the emergency department for evaluation of low back pain after fall.  Patient reports that she is been doing a lot of cleaning and activities at home and her back has been hurting more than usual, but today she was trying to change a shower curtain when she slipped and fell hitting her low back on the edge of the tub.  She did not hit her head, denies any loss of consciousness.  No neck or upper back pain out of her ordinary.  She denies any loss of control of her bowels or bladders, no saddle anesthesia.  She reports that she has chronic issues with her left leg currently gives out and she thinks is why she fell today but is not having any new numbness or weakness just severe pain shooting down the left leg.  Patient received 200 mcg of fentanyl with EMS, but on arrival when she is in the presence of other staff is yelling repeatedly complaining of pain and spasms in her leg.  Reports she is taken 2 doses of her home Percocet today with no improvement.     Past Medical History:  Diagnosis Date  . ABDOMINAL WALL HERNIA 02/27/2010  . Abnormal Pap smear   . ALLERGIC RHINITIS 06/02/2007  . ANOREXIA, CHRONIC 08/07/2008  . ANXIETY 09/15/2010  . BACK PAIN, THORACIC REGION 07/07/2007  . Bipolar disorder ([place])   . CERVICAL RADICULOPATHY 12/11/2008  . Condyloma acuminatum 04/23/2009  . CONSTIPATION 09/15/2010  . DYSPHAGIA UNSPECIFIED 09/09/2009  . 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 ENDOMETRIOSIS 12/16/2009  . FATIGUE 06/11/2010  . Fibromyalgia   . GERD 10/23/2009  . Heart palpitations   . HERPES SIMPLEX INFECTION 06/11/2010  . LENTIGO  04/23/2009  . LUNG NODULE 06/17/2010  . Narcotic abuse, continuous ([place])   . Ovarian cyst   . Palpitations 10/23/2009  . Panic disorder   . Rheumatoid arthritis(714.0)   . Stricture and stenosis of esophagus 05/13/2010  . TOBACCO ABUSE 08/07/2008  . Urinary tract infection     Patient Active Problem List   Diagnosis Date Noted  . Bipolar 1 disorder, mixed, moderate ([facility]) 07/15/2015    Class: Chronic  . 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 Sciatica 01/09/2015  . Reactive hypoglycemia 12/17/2013  . Musculoskeletal malfunction arising from mental factors 04/04/2013  . Incisional hernia 11/15/2012  . Other postprocedural status(V45.89) 11/15/2012  . Abdominal pain 11/11/2012  . Chronic pain syndrome 11/11/2012  . Disorder of sacrum 11/11/2012  . Heartburn 10/04/2011  . DYSPHAGIA 10/04/2011  . Depression with anxiety 09/15/2010  . CONSTIPATION 09/15/2010  . LUNG NODULE 06/17/2010  . HERPES SIMPLEX INFECTION 06/11/2010  . FATIGUE 06/11/2010  . STRICTURE AND STENOSIS OF ESOPHAGUS 05/13/2010  . ABDOMINAL WALL HERNIA 02/27/2010  . ENDOMETRIOSIS 12/16/2009  . GERD 10/23/2009  . PALPITATIONS 10/23/2009  . 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 CONDYLOMA ACUMINATUM 04/23/2009  . LENTIGO 04/23/2009  . DENTAL PAIN 03/19/2009  . CERVICAL RADICULOPATHY 12/11/2008  . 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 TOBACCO ABUSE 08/07/2008  . BACK PAIN, THORACIC REGION  07/07/2007  . ALLERGIC RHINITIS 06/02/2007    Past Surgical History:  Procedure Laterality Date  . ABDOMINAL SURGERY    . ABDOMINAL WALL MESH  REMOVAL    . ABLATION ON ENDOMETRIOSIS    . CESAREAN SECTION     x2   . DILATION AND CURETTAGE OF UTERUS     x1   . ENDOMETRIAL ABLATION    . esophageal dilatation x 4    . GUM SURGERY    . HERNIA REPAIR     X3  . PARTIAL HYSTERECTOMY       OB History    Gravida  3   Para  2   Term  2   Preterm  0    AB  1   Living  2     SAB  1   TAB  0   Ectopic  0   Multiple  0   Live Births  1            Home Medications    Prior to Admission medications   Medication Sig Start Date End Date Taking? Authorizing Provider  clonazePAM (KLONOPIN) 0.5 MG tablet Take 0.5 mg by mouth 3 (three) times daily. 08/30/18  Yes [provider]  escitalopram (LEXAPRO) 20 MG tablet Take 30 mg by mouth at bedtime.   Yes [provider]  lurasidone (LATUDA) 80 MG TABS tablet Take 80 mg by mouth at bedtime.    Yes [provider]  methocarbamol (ROBAXIN) 750 MG tablet Take 750 mg by mouth every 8 (eight) hours as needed for muscle spasms.   Yes [provider]  oxyCODONE-acetaminophen (PERCOCET) 7.5-325 MG tablet Take 1 tablet by mouth 4 (four) times daily.   Yes [provider]  methylPREDNISolone (MEDROL DOSEPAK) 4 MG TBPK tablet Take as directed on package Patient not taking: Reported on 08/22/2018 08/31/17   Duffy Bruce, MD    Family History Family History  Problem Relation Age of Onset  . Depression Mother   . Arthritis Mother   . Hyperlipidemia Father   . Hypertension Father     Social History Social History   Tobacco Use  . Smoking status: Current Every Day Smoker    Packs/day: 1.00    Types: Cigarettes  . Smokeless tobacco: Never Used  Substance Use Topics  . Alcohol use: No  . Drug use: No     Allergies   Lamictal [lamotrigine]; Morphine and related; Nicotine; Zofran; Chantix [varenicline tartrate]; Codeine; Haldol [haloperidol]; Ibuprofen; Lidoderm [lidocaine]; Metaxalone; Sulfa antibiotics; Toradol [ketorolac tromethamine]; Duloxetine; Gabapentin; Amoxicillin; Aripiprazole; and Penicillins   Review of Systems Review of Systems  Constitutional: Negative for chills and fever.  HENT: Negative.   Respiratory: Negative for shortness of breath.   Cardiovascular: Negative for chest pain.  Gastrointestinal: Negative for  abdominal pain, constipation, diarrhea, nausea and vomiting.  Genitourinary: Negative for dysuria, flank pain, frequency and hematuria.  Musculoskeletal: Positive for back pain. Negative for arthralgias, gait problem, joint swelling, myalgias and neck pain.  Skin: Negative for color change, rash and wound.  Neurological: Negative for weakness and numbness.     Physical Exam Updated Vital Signs BP 108/62   Pulse (!) 107   Temp 98.6 F (37 C) (Oral)   Resp 20   SpO2 96%   Physical Exam  Constitutional: She is oriented to person, place, and time. She appears well-developed and well-nourished. No distress.  HENT:  Head: Atraumatic.  Eyes: Right eye exhibits no discharge. Left eye exhibits no  discharge.  Neck: Neck supple.  Cardiovascular:  Pulses:      Radial pulses are 2+ on the right side, and 2+ on the left side.       Dorsalis pedis pulses are 2+ on the right side, and 2+ on the left side.       Posterior tibial pulses are 2+ on the right side, and 2+ on the left side.  Pulmonary/Chest: Effort normal. No respiratory distress.  Abdominal: Soft. Bowel sounds are normal. She exhibits no distension and no mass. There is no tenderness. There is no guarding.  Abdomen soft, nondistended, nontender to palpation in all quadrants without guarding or peritoneal signs, no CVA tenderness bilaterally  Musculoskeletal:  Tenderness to palpation over her spine and left low back as well as the left hip without obvious palpable deformity.  Pain made worse with range of motion of the lower extremities, positive straight leg raise on the left.  Neurological: She is alert and oriented to person, place, and time.  Alert, clear speech, following commands. Moving all extremities without difficulty. Bilateral lower extremities with 5/5 strength in proximal and distal muscle groups and with dorsi and plantar flexion. Sensation intact in bilateral lower extremities. 2+ patellar DTRs bilaterally.  Skin:  Skin is warm and dry. Capillary refill takes less than 2 seconds. She is not diaphoretic.  Psychiatric: She has a normal mood and affect. Her behavior is normal.  Nursing note and vitals reviewed.    ED Treatments / Results  Labs (all labs ordered are listed, but only abnormal results are displayed) Labs Reviewed - No data to display  EKG None  Radiology Dg Lumbar Spine Complete  Result Date: 09/18/2018 CLINICAL DATA:  Patient was changing sharp carton and fell in tub complaining of lower mid abdominal pain. EXAM: LUMBAR SPINE - COMPLETE 4+ VIEW COMPARISON:  Lumbar spine MRI 04/02/2018 FINDINGS: The study was limited as patient deferred additional imaging beyond the 4 images acquired. No true lateral view was able to be obtained limiting assessment. Frontal and left anterior oblique views of the lumbar spine demonstrate no acute fracture or suspicious osseous lesions. The included sacroiliac joints appear intact. The arcuate lines of the sacrum appear intact without disruption. Mild degenerative disc disease L1-2. IMPRESSION: No acute osseous abnormality of the lumbar spine given limitations as described above. Consider lumbar spine CT should clinical presentation warrant. Electronically Signed   By: Ashley Royalty M.D.   On: 09/18/2018 14:18   Dg Hip Unilat W Or Wo Pelvis 2-3 Views Left  Result Date: 09/18/2018 CLINICAL DATA:  Left hip pain after fall in shower. EXAM: DG HIP (WITH OR WITHOUT PELVIS) 2-3V LEFT COMPARISON:  None. FINDINGS: AP view of the pelvis including both hips as well as an AP view the left hip were provided. No frog-leg view is included as patient reportedly deferred additional imaging. There is no evidence of hip fracture or dislocation. There is no evidence of arthropathy or other focal bone abnormality. IMPRESSION: Negative. Electronically Signed   By: Ashley Royalty M.D.   On: 09/18/2018 14:19    Procedures Procedures (including critical care time)  Medications Ordered  in ED Medications  lidocaine (LIDODERM) 5 % 2 patch (2 patches Transdermal Patch Applied 09/18/18 1427)  methocarbamol (ROBAXIN) 500 mg in dextrose 5 % 50 mL IVPB (500 mg Intravenous New Bag/Given 09/18/18 1446)  oxyCODONE-acetaminophen (PERCOCET/ROXICET) 5-325 MG per tablet 2 tablet (2 tablets Oral Refused 09/18/18 1353)  0.9 %  sodium chloride infusion (500 mLs Intravenous  New Bag/Given 09/18/18 1445)  ketorolac (TORADOL) 30 MG/ML injection 30 mg (30 mg Intravenous Given 09/18/18 1421)  promethazine (PHENERGAN) injection 12.5 mg (12.5 mg Intravenous Given 09/18/18 1422)  dexamethasone (DECADRON) injection 10 mg (10 mg Intravenous Given 09/18/18 1447)     Initial Impression / Assessment and Plan / ED Course  I have reviewed the triage vital signs and the nursing notes.  Pertinent labs & imaging results that were available during my care of the patient were reviewed by me and considered in my medical decision making (see chart for details).  Patient with back pain, has known history of chronic sciatica and fibromyalgia but reports fall today while cleaning landing on her low back.  No neurological deficits and normal neuro exam.  Patient is refusing to even attempt to walk until she is given IV pain medication, of note patient was given 200 mcg of fentanyl with EMS while in transit.  No loss of bowel or bladder control.  No concern for cauda equina, this seems to be an exacerbation of her chronic sciatica.  No fever, night sweats, weight loss, h/o cancer, IVDU.  We will get x-rays of the lumbar spine and hip.  Patient offered lidocaine patch, nausea medication, Toradol, Robaxin, Decadron and 10 mg p.o. Percocet for pain management.  Patient declined Toradol and p.o. Percocet reporting that she knows what helps her, and repeatedly requesting IV Dilaudid and reporting she only wants IV pain medication.  Patient has been seen in the ED with very similar presentations 5 times in the past year, and is a  note from 2013 in the chart regarding drug-seeking behavior, given this case was discussed with Dr. Venora Maples, feel the patient's pain has been managed appropriately given her mechanism of injury, and x-rays not showing any evidence of new acute fracture.  Discussed with patient that we would not be offering any additional pain medication, she continues to refuse p.o. Medications, and is refusing to attempt to walk but is moving her extremities.  At this time feel she is stable for discharge home, encouraged her to follow-up with her primary care doctor who prescribes her chronic pain medication.  At this time there does not appear to be any evidence of an acute emergency medical condition and the patient appears stable for discharge with appropriate outpatient follow up.Diagnosis was discussed with patient who verbalizes understanding and is agreeable to discharge.   Patient discussed with Dr. Venora Maples, who saw patient as well and agrees with plan.   Final Clinical Impressions(s) / ED Diagnoses   Final diagnoses:  Fall, initial encounter  Acute left-sided low back pain with left-sided sciatica    ED Discharge Orders    None       Janet Berlin 09/18/18 1511    Jola Schmidt, MD 09/18/18 1515

## 2018-09-18 NOTE — ED Notes (Signed)
Pt refused to take PO oxydone, stating she takes those at home "I told the doctor I wanted IV pain meds to knock out the pain."  Pt tearful and agitated. RN informed pt that IV pain medications were not yet verified by pharmacy, nor in the ED.  Pt insisted on having only IV pain meds.

## 2018-09-18 NOTE — ED Provider Notes (Addendum)
2:43 PM Pt refusing oral opioid pain medication at this time. States "it won't work". Contusion. No fracture on xray. Recurrent sciatica like pain. Hx of fibromyalgia. Follows with a pain team. Plan will be for symptom control in the emergency department and discharge home with primary care follow up. Plan for IV robaxin and decadron. IV ketorolac given. lidoderm patch applied.    Jola Schmidt, MD 09/18/18 [place]    Jola Schmidt, MD 09/18/18 [phone]

## 2018-10-02 DIAGNOSIS — M26623 Arthralgia of bilateral temporomandibular joint: Secondary | ICD-10-CM | POA: Insufficient documentation

## 2018-10-02 DIAGNOSIS — H9203 Otalgia, bilateral: Secondary | ICD-10-CM | POA: Insufficient documentation

## 2018-10-05 ENCOUNTER — Encounter (HOSPITAL_COMMUNITY): Payer: Self-pay | Admitting: Psychiatry

## 2018-10-05 ENCOUNTER — Other Ambulatory Visit (HOSPITAL_COMMUNITY): Payer: Medicare Other | Attending: Psychiatry | Admitting: Psychiatry

## 2018-10-05 DIAGNOSIS — F3162 Bipolar disorder, current episode mixed, moderate: Secondary | ICD-10-CM | POA: Diagnosis not present

## 2018-10-05 DIAGNOSIS — G894 Chronic pain syndrome: Secondary | ICD-10-CM | POA: Diagnosis not present

## 2018-10-05 NOTE — Progress Notes (Signed)
    Daily Group Progress Note  Program: IOP  Group Time: 9am-12pm  Participation Level: Minimal  Behavioral Response: Appropriate  Type of Therapy:  Group Therapy; process group, psycho-educational group  Summary of Progress:  The purpose of this encounter is to utilize CBT and DBT skills in a group setting to increase use of healthy coping skills and decrease intensity of active mental health symptoms.  9am-11am Clinician and group members completed and discussed "I Am" poem, focused on identifying priorities, goals, strengths, and areas of desired growth. Clinician actively listened and utilize socratic questioning while validating client feelings. Clinician encouraged clients to identify specific thoughts and feelings during conversations. Clinician facilitated discussion on the importance of utilizing previously learned I-statements and assertive communication to maintain positive, supportive relationships.  11am-12pm Yoga and Mindfulness session lead by co-facilitator. Client met with case manager for group orientation, see note for details. Client actively listened to group discussions. Client shared a stressors currently is her relationship with her daughter and others bringing up the past when she is trying to move on.  Olegario Messier, LCSW
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
## 2018-10-06 ENCOUNTER — Other Ambulatory Visit (HOSPITAL_COMMUNITY): Payer: Medicare Other | Admitting: Family

## 2018-10-06 DIAGNOSIS — F3162 Bipolar disorder, current episode mixed, moderate: Secondary | ICD-10-CM
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
## 2018-10-09 ENCOUNTER — Other Ambulatory Visit (HOSPITAL_COMMUNITY): Payer: Medicare Other

## 2018-10-10 ENCOUNTER — Other Ambulatory Visit (HOSPITAL_COMMUNITY): Payer: Medicare Other | Admitting: Licensed Clinical Social Worker

## 2018-10-10 DIAGNOSIS — F3162 Bipolar disorder, current episode mixed, moderate: Secondary | ICD-10-CM | POA: Diagnosis not present

## 2018-10-10 NOTE — Progress Notes (Signed)
    Daily Group Progress Note  Program: IOP  Group Time: 9am-12pm  Participation Level: Active  Behavioral Response: Appropriate and Sharing  Type of Therapy:  Group Therapy; psychoeducational group, process group  Summary of Progress:  The purpose of this group is to utilize CBT and DBT skills in a group setting to increase knowledge of and utilization of healthy coping skills to manager active mental health symptoms.  9am-11am Clinician presented the topic of 'Feelings, Thoughts, and Mind Traps' from [place]. Clinician processed with group members different mind traps and role played how to challenge each type of thought. Clinician challenged group members to identify distorted thoughts, the feeling which came with the thought, and create an alternative thoughts. Clinician and group members reviewed steps for challenging mind traps, starting with identifying uncomfortable feeling and related thought and disrupting thoughts with positive self talk and alternative, helpful thoughts. Clinician praised clients on going open to new thoughts, even if they did not agree. Clinician presented 'Roadblocks to Healthy Thinking: Ways of Thinking That Keep You Stuck." Clinician challenged group members to identify a time where they were caught in these, or others were caught in them during an interaction. Clinician and group members reviewed 'Developing New Thinking Habits' in response to distorted thinking. Clinician validated that skill is difficult and does not come overnight.  11am-12pm Second group co-facilitated by [employer]. Processing related to grief and losses and coping with life changes. Client participated in discussion, actively listening and providing some examples of mind trap situations specifically related to her daughter. Client shared in second group about loss of the relationship with her daughter and how she expected it to be, particularly around the holidays. Client also noted feeling lost due  to recently obtaining disability and losing part of her identity from working.  Olegario Messier, LCSW

## 2018-10-11 ENCOUNTER — Other Ambulatory Visit (HOSPITAL_COMMUNITY): Payer: Medicare Other | Admitting: Licensed Clinical Social Worker

## 2018-10-11 ENCOUNTER — Encounter (HOSPITAL_COMMUNITY): Payer: Self-pay | Admitting: Family

## 2018-10-11 DIAGNOSIS — F3162 Bipolar disorder, current episode mixed, moderate: Secondary | ICD-10-CM

## 2018-10-11 NOTE — Progress Notes (Signed)
    Daily Group Progress Note  Program: IOP  Group Time: 9am-12pm  Participation Level: Active  Behavioral Response: Sharing, Resistant and Motivated  Type of Therapy:  Group Therapy; psycho-educational group, process group  Summary of Progress:  Clinician checked in with group, assessing for SI/HI/psychosis.  9am-11am Clinician presented the topic of Vulnerability. Clinician provided Video by Almond Lint. Clinician facilitated discussion with group members related to the topic of vulnerability. Clinician inquired about thoughts and feelings related to topic. Clinician and group members discussed pros of showing vulnerability and barriers to opening up. Clinician presented DBT Distress Tolerance Skills. Clinician and group members review Distraction skill with ACCEPTS, Self-Soothe with Senses, and Radical Acceptance. Clinician and group members practiced 5-4-3-2-1 mindfulness senses skill as well as square breathing.  Clinician and group members discussed the importance with the ACCEPT skill to be mindful of only using distraction skills to lessen intensity of emotion, not numb the emotion and return to isolating. Clinician and group members also reviewed TIPP skill.  11am-12pm Clinician facilitated a discussions on self esteem utilizing gratitude and positive self trait exercise Totika. Activity focused on using open-ended questions intended to promote discussions and processing about self-confidence, setting and achieving goals, role models, motivation, personal success and happiness. Client shared concerns about meeting with her daughter with whom she currently has a strained relationship. Client was receptive to group members recommendations of non-blaming questions, I-statements, and ways to focus conversation on the present rather than past actions. Client verbalized she is still nervous but is open to trying suggestions to repair relationship, though there is a desire to an apology from  the daughter to 'resolve' discord between daughter and parents.  Olegario Messier, LCSW

## 2018-10-12 ENCOUNTER — Other Ambulatory Visit (HOSPITAL_COMMUNITY): Payer: Medicare Other | Admitting: Licensed Clinical Social Worker

## 2018-10-12 DIAGNOSIS — F3162 Bipolar disorder, current episode mixed, moderate: Secondary | ICD-10-CM | POA: Diagnosis not present

## 2018-10-12 NOTE — Progress Notes (Signed)
    Daily Group Progress Note  Program: IOP  Group Time: 9am-12pm  Participation Level: Active  Behavioral Response: Appropriate, Rationalizing and Minimizing  Type of Therapy: Group Therapy; process group, psycho-educational group  ?  Summary of Progress:  The purpose of this group is to utilize CBT and DBT skills in a group setting ton increase use of healthy coping skills and decrease intensity and frequency of active mental health symptoms. 9am-10:30am Clinician checked in with group members, assessing for SI/HI/psychosis and level of functioning. Clinician inquired about goals met over the weekend and any skills attempted. Clinician praised members for progress.  Clinician presented the topic of Co-dependency Characteristics. Clinician described co-dependency and how this affects communication styles and relationships with ourselves and others. Clinician requested group members complete Codependency Characteristics Scale to identify traits of Caretaking, Self-Worth and Dependency. Clinician and group members processed how this can currently been seen in their daily lives.  10:30am-12pm Clinician facilitated discussion with group members about levels of attachment, family dysfunction and self worth, dependency and boundary needs, communication skills, and responsibilities. Clinician and group members processed thoughts and feelings related to these behaviors, and desires to change as well as boundaries to changing.  Clinician challenged each group member to create one moment of joy over the rest of the day.  Client identified having co-dependent traits and knowing this however did complete inventories at prompting of clinician. Client reports not realizing 'how bad it is' based on scores. Client solicited feedback from the group related to upcoming conversation with her daughter. Client appeared receptive to options from group members however maintained her daughter is the problem, she has no  current role in the strained relationship.  Olegario Messier, LCSW

## 2018-10-13 ENCOUNTER — Other Ambulatory Visit (HOSPITAL_COMMUNITY): Payer: Medicare Other | Admitting: Psychiatry

## 2018-10-13 ENCOUNTER — Telehealth (HOSPITAL_COMMUNITY): Payer: Self-pay | Admitting: Psychiatry

## 2018-10-16 ENCOUNTER — Other Ambulatory Visit (HOSPITAL_COMMUNITY): Payer: Medicare Other | Admitting: Licensed Clinical Social Worker

## 2018-10-16 DIAGNOSIS — F3162 Bipolar disorder, current episode mixed, moderate: Secondary | ICD-10-CM

## 2018-10-16 NOTE — Progress Notes (Signed)
    Daily Group Progress Note  Program: IOP  Group Time: 9am-12pm  Participation Level: Active  Behavioral Response: Appropriate and Resistant  Type of Therapy:  Group Therapy; psycho-educational group, process group  Summary of Progress:  The purpose of this group is to utilize CBT and DBT skills in a group setting to increase use of healthy coping skills and decrease frequency and intensity of active mental health symptoms  9am-9:15am: Clinician checked in with group members, assessing for SI/HI/psychosis and overall level of functioning. Clinician inquired about completed self care activities and skills practiced over the weekend.  9:15am-10:30am Group co-facilitated by pharmacist. Clients were provided with time to answer medication related questions. Pharmacist provided pscyho-educational information related to classes of medications, uses, and side effects.  10:30am-12pm: Clinician presented the topic of Perfectionism. Clinician and group members processed what perfectionism is, how it can look, and how it can effect vulnerability and mental health symptoms. Clinician and group members processed the effects of self criticism and unhelpful thinking, reviewing distorted thinking styles. Clinician prompted clients to name a positive self trait and an accomplishment from the past month. Clinician encouraged clients to give credit to things which previously seemed small, focusing on changes in level of functioning and progress rather than perfection. Clinician reviewed Thought Diaries and use in investigating accuracy and helpfulness of negative thoughts and develop new, more balanced thoughts. Clinician provided group members the opportunity to practice and provide support in group session with common unhelpful thoughts. Client reports that the meeting with her daughter went well and she was able to implement suggestions from group the previous week to avoid any escalation of situations. Client  was able to identify progress made with accomplishments however continues to compare to her life prior to being on disability. Client verbalizes struggle with finding new purpose of life. Client was somewhat receptive to feedback from peers.  Olegario Messier, LCSW

## 2018-10-17 ENCOUNTER — Other Ambulatory Visit (HOSPITAL_COMMUNITY): Payer: Medicare Other | Admitting: Licensed Clinical Social Worker

## 2018-10-17 DIAGNOSIS — F3162 Bipolar disorder, current episode mixed, moderate: Secondary | ICD-10-CM | POA: Diagnosis not present

## 2018-10-17 NOTE — Progress Notes (Signed)
    Daily Group Progress Note  Program: IOP  Group Time: 9am-12pm  Participation Level: Active  Behavioral Response: Sharing, Resistant and Minimizing  Type of Therapy:  Group Therapy; psycho-educational group, process group  Summary of Progress:  The purpose of this group is to utilize CBT and DBT skills in a group setting to increase use of healthy coping skills and decrease frequency and intensity of active mental health symptoms.  9am-11am Clinician and group members discussed rules and expectations for group services. Clinician inquired about client strengths and reviewed the importance of ongoing positive self talk as a response to negative automatic thoughts. Clinician praised progress seen in group and group members supported each other with progress an positive traits observed. Clinician and group members discussed the importance of vulnerability and human connection to help improve mental health symptoms and building support system. Clinician presented activity focused on self reflection and building self confidence. Clinician actively listened to group members and validated thoughts and feelings. Clinician offered progressive muscle relaxation as a skill to manage anxiety levels as well as focusing on gratitude in difficult situations.  11am-12pm Chaplin facilitated process group related to grief and loss. Group processed where and how they learned expressing feelings as well as what types of losses have been experienced. Client participated in group discussions. Client verbalized still being upset related to her first husband leaving her and his daughter and taking away opportunities for their daughter by committing suicide.  Olegario Messier, LCSW

## 2018-10-18 ENCOUNTER — Encounter (HOSPITAL_COMMUNITY): Payer: Self-pay | Admitting: Family

## 2018-10-18 ENCOUNTER — Other Ambulatory Visit (HOSPITAL_COMMUNITY): Payer: Medicare Other | Admitting: Licensed Clinical Social Worker

## 2018-10-18 DIAGNOSIS — F3162 Bipolar disorder, current episode mixed, moderate: Secondary | ICD-10-CM | POA: Diagnosis not present

## 2018-10-18 NOTE — Progress Notes (Signed)
Gina [name] is a 42 y.o. , married, disabled (physical and mental), Caucasian female, who was a self referral; treatment for Bipolar Disorder (Depression).  Pt states she believes she has suffered from bipolar disorder all her life but was only diagnosed a couple of years ago. She describes many episodes of depression currently, but by hx other episodes of very poor judgement that she attributes to mania. Pt is well known to writer due to previous MH-IOP admit (Sept 2016) d/t mania.  *CC: previous notes for hx.  Pt currently sees Drs. Reddy and [place], [place].  Admits to a hx of multiple psychiatric hospitalizations.  Stressors/Triggers:  1)   Unresolved grief/loss issues:  "I don't have a purpose"  Pt has been on long term disability for ~ 1 yr d/t Bipolar D/O.  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  "I just don't know who I am anymore."  2) Conflictual relationship with her 62 yr old daughter; who recently had an abortion.  3) Physical Limitations d/t mulitple medical issues:  Fibromyalgia, TMJ, Degenerative Disc Disease, Sciatica Nerve Pain.  Family Hx:  Maternal GM (OCD). Pt attended [facility] for eight days and is requesting discharge.  "The time went by so fast but I am doing better overall. It was just a review of skills for me."  Pt states she is still struggling with not knowing what to do with her life.  Reports situation is better with her daughter now. Pt continues to deny SI/HI or A/V hallucinations.  A:  D/C today.  F/U with Dr. Reece Levy as scheduled and Rosana Fret, [facility] on 10-18-18.  Strongly encouraged support groups.  R:  Pt receptive.                                                                       Carlis Abbott, [name], M.Ed, CNA

## 2018-10-18 NOTE — Patient Instructions (Signed)
D:  Patient completed MH-IOP today.  A:  Follow up with Dr. Reece Levy (in three months) and Rosana Fret, [facility] on tomorrow.  Encouraged support groups.  R:  Pt receptive.

## 2018-10-18 NOTE — Progress Notes (Signed)
    Daily Group Progress Note  Program: IOP  Group Time: 9am-12pm  Participation Level: Active  Behavioral Response: Appropriate  Type of Therapy:  Group Therapy; process group, psycho-educational group  Summary of Progress:  The purpose of this session is to utilize CBT and DBT skills in a group setting to increase utilization of healthy coping skills and decrease intensity of active mental health symptoms.  9am-11a: Clinician presented the topic of Personal boundaries. Clinician provided psycho-educational material related to types of boundaries and common traits of types of boundaries. Clinician and group members processed limits currently set in daily life. Clinician and group members discussed having different boundaries with different people and processed why this can be important. Clinician and group members role played plans for setting boundaries with family members and employers. Clinician praised group members verbalizing boundaries. Clinician and group members reviewed types of boundaries and examples including physical, emotional, sexual, material, and time boundaries. Clinician and group members processed how boundaries currently exist in their lives, how they want them to look, and brainstormed ways to get there.11am-12pm Co-facilitator for psycho-educational group from [facility]. Guest provided psycho-educational information on health and wellness in relation to mental health. Topics included sleep hygiene, healthy eating habits, and benefits to exercise.  Client completes IOP on this day. Client participated in discussion and was supportive of other group members. Client reports improved interactions with her daughter however still has not identified a purpose in life. Client will follow up with primary therapist. See Case manager note for additional details.  Olegario Messier, LCSW

## 2018-10-18 NOTE — Progress Notes (Signed)
  [facility] Intensive Outpatient Program Discharge Summary  Sherisa Gilvin [id number]  Admission date: 10/05/2018 Discharge date: 10/18/2018  Reason for admission:  Per assessment note: Gina [name] 42 year old Caucasian female presents with history of bipolar and depression diagnoses.  Reports worsening anxiety and mood irritability related to family stressors.  Reports she was a self-referral as she is followed [facility].  Reports more recently she has been struggling with chronic depression and mood fluctuation.  Reports she is prescribed Latuda 80 mg p.o. nightly and Rexulti 1 mg p.o. daily.  She reports rports taking prescription pain medications for the past 8 years.  Reports taking Percocet 7.5mg - 325mg  in addition to Flexeril muscle relaxant for pain.  Patient has requested for medications to be adjusted only by her primary psychiatrist.  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  Lamonica reports she has thought theses group in the past.  Chemical Use History: was denied  Family of Origin Issues: Cecille Rubin indicated that she would like to continue working on the relationship between she and her daughter.  As she continue to work on setting boundaries and Proofreader. Patient indicated she would like to figure out her future goals in a good direction for her future/family  Progress in Program Toward Treatment Goals: Ongoing, Christle attended and participated with daily group sessions.  As noted by daily self inventory's she Sausha Raymond continues to rate her depression 4 out of 10 with 10 being the worst daily.  Mild improvement noted on discharge inventory assessment.  Patient has requested to be discharge to follow-up with her therapist.  As patient was not seen on discharge by this provider,was indicated that additional resources was made available.  Progress (rationale): Darnelle to  follow-up with MD. Reece Levy and Rosana Fret NP with a follow appointment with her Therapist on 10/18/2018 per Daily Self Inventory.   Take all medications as prescribed. Keep all follow-up appointments as scheduled.  Do not consume alcohol or use illegal drugs while on prescription medications. Report any adverse effects from your medications to your primary care provider promptly.  In the event of recurrent symptoms or worsening symptoms, call 911, a crisis hotline, or go to the nearest emergency department for evaluation.    Derrill Center, NP 10/18/2018

## 2018-10-19 ENCOUNTER — Other Ambulatory Visit (HOSPITAL_COMMUNITY): Payer: Medicare Other

## 2018-10-19 ENCOUNTER — Encounter (HOSPITAL_COMMUNITY): Payer: Self-pay | Admitting: Family

## 2018-12-19 ENCOUNTER — Other Ambulatory Visit: Payer: Self-pay

## 2018-12-19 ENCOUNTER — Emergency Department (HOSPITAL_COMMUNITY)
Admission: EM | Admit: 2018-12-19 | Discharge: 2018-12-19 | Disposition: A | Discharge status: Home / Self Care | Payer: Medicare Other | Attending: Emergency Medicine | Admitting: Emergency Medicine

## 2018-12-19 ENCOUNTER — Encounter (HOSPITAL_COMMUNITY): Payer: Self-pay

## 2018-12-19 DIAGNOSIS — M5432 Sciatica, left side: Secondary | ICD-10-CM | POA: Diagnosis not present

## 2018-12-19 DIAGNOSIS — F17210 Nicotine dependence, cigarettes, uncomplicated: Secondary | ICD-10-CM | POA: Diagnosis not present

## 2018-12-19 DIAGNOSIS — Z79899 Other long term (current) drug therapy: Secondary | ICD-10-CM | POA: Insufficient documentation

## 2018-12-19 DIAGNOSIS — M5489 Other dorsalgia: Secondary | ICD-10-CM | POA: Diagnosis present

## 2018-12-19 MED ORDER — METHYLPREDNISOLONE SODIUM SUCC 125 MG IJ SOLR
125.0000 mg | Freq: Once | INTRAMUSCULAR | Status: AC
  Administered 2018-12-19: 125 mg via INTRAVENOUS
  Filled 2018-12-19: qty 2

## 2018-12-19 MED ORDER — SODIUM CHLORIDE 0.9 % IV BOLUS
1000.0000 mL | Freq: Once | INTRAVENOUS | Status: AC
  Administered 2018-12-19: 1000 mL via INTRAVENOUS

## 2018-12-19 MED ORDER — HYDROMORPHONE HCL 1 MG/ML IJ SOLN
1.0000 mg | Freq: Once | INTRAMUSCULAR | Status: AC
  Administered 2018-12-19: 1 mg via INTRAVENOUS
  Filled 2018-12-19: qty 1

## 2018-12-19 MED ORDER — PROMETHAZINE HCL 25 MG/ML IJ SOLN
12.5000 mg | Freq: Once | INTRAMUSCULAR | Status: AC
  Administered 2018-12-19: 12.5 mg via INTRAVENOUS
  Filled 2018-12-19: qty 1

## 2018-12-19 NOTE — Discharge Instructions (Addendum)
See your doctor as needed for problems 

## 2018-12-19 NOTE — ED Provider Notes (Signed)
[facility] DEPT Provider Note   CSN: [id number] Arrival date & time: 12/19/18  1736    History   Chief Complaint Chief Complaint  Patient presents with  . Back Pain    HPI Gina [name] is a 43 y.o. female.     HPI   She presents for evaluation of back pain, which she describes as sciatica felt primarily in the left lower lumbar region rating to her left buttock and left leg.  She states that the discomfort is worse than usual because, "my injection wore off and I been doing more than usual."  She has been having to care for family members who are ill.  Her last injection, apparently done by fluoroscopy, was about 6 months ago.  She denies fever, chills, nausea, vomiting.  There is been no trauma.  She has chronic pain, and takes oxycodone 7.5 mg 4 times a day.  There are no other known modifying factors.  Past Medical History:  Diagnosis Date  . ABDOMINAL WALL HERNIA 02/27/2010  . Abnormal Pap smear   . ALLERGIC RHINITIS 06/02/2007  . ANOREXIA, CHRONIC 08/07/2008  . ANXIETY 09/15/2010  . BACK PAIN, THORACIC REGION 07/07/2007  . Bipolar disorder ([place])   . CERVICAL RADICULOPATHY 12/11/2008  . Condyloma acuminatum 04/23/2009  . CONSTIPATION 09/15/2010  . DYSPHAGIA UNSPECIFIED 09/09/2009  . 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 ENDOMETRIOSIS 12/16/2009  . FATIGUE 06/11/2010  . Fibromyalgia   . GERD 10/23/2009  . Heart palpitations   . HERPES SIMPLEX INFECTION 06/11/2010  . LENTIGO 04/23/2009  . LUNG NODULE 06/17/2010  . Narcotic abuse, continuous ([place])   . Ovarian cyst   . Palpitations 10/23/2009  . Panic disorder   . Rheumatoid arthritis(714.0)   . Stricture and stenosis of esophagus 05/13/2010  . TOBACCO ABUSE 08/07/2008  . Urinary tract infection     Patient Active Problem List   Diagnosis Date Noted  . Bipolar 1 disorder, mixed, moderate (HCC) 07/15/2015    Class: Chronic  . 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 Sciatica  01/09/2015  . Reactive hypoglycemia 12/17/2013  . Musculoskeletal malfunction arising from mental factors 04/04/2013  . Incisional hernia 11/15/2012  . Other postprocedural status(V45.89) 11/15/2012  . Abdominal pain 11/11/2012  . Chronic pain syndrome 11/11/2012  . Disorder of sacrum 11/11/2012  . Heartburn 10/04/2011  . DYSPHAGIA 10/04/2011  . Depression with anxiety 09/15/2010  . CONSTIPATION 09/15/2010  . LUNG NODULE 06/17/2010  . HERPES SIMPLEX INFECTION 06/11/2010  . FATIGUE 06/11/2010  . STRICTURE AND STENOSIS OF ESOPHAGUS 05/13/2010  . ABDOMINAL WALL HERNIA 02/27/2010  . ENDOMETRIOSIS 12/16/2009  . GERD 10/23/2009  . PALPITATIONS 10/23/2009  . 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 CONDYLOMA ACUMINATUM 04/23/2009  . LENTIGO 04/23/2009  . DENTAL PAIN 03/19/2009  . CERVICAL RADICULOPATHY 12/11/2008  . 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 TOBACCO ABUSE 08/07/2008  . BACK PAIN, THORACIC REGION 07/07/2007  . ALLERGIC RHINITIS 06/02/2007    Past Surgical History:  Procedure Laterality Date  . ABDOMINAL SURGERY    . ABDOMINAL WALL MESH  REMOVAL    . ABLATION ON ENDOMETRIOSIS    . CESAREAN SECTION     x2   . DILATION AND CURETTAGE OF UTERUS     x1   . ENDOMETRIAL ABLATION    . esophageal dilatation x 4    . GUM SURGERY    . HERNIA REPAIR     X3  . PARTIAL HYSTERECTOMY  OB History    Gravida  3   Para  2   Term  2   Preterm  0   AB  1   Living  2     SAB  1   TAB  0   Ectopic  0   Multiple  0   Live Births  1            Home Medications    Prior to Admission medications   Medication Sig Start Date End Date Taking? Authorizing Provider  Brexpiprazole (REXULTI) 1 MG TABS Take by mouth.    [provider]  clonazePAM (KLONOPIN) 0.5 MG tablet Take 0.5 mg by mouth 3 (three) times daily. 08/30/18   [provider]  lurasidone (LATUDA) 80 MG TABS tablet Take 80 mg by  mouth at bedtime.     [provider]  oxyCODONE-acetaminophen (PERCOCET) 7.5-325 MG tablet Take 1 tablet by mouth 4 (four) times daily.    [provider]    Family History Family History  Problem Relation Age of Onset  . Depression Mother   . Arthritis Mother   . Hyperlipidemia Father   . Hypertension Father     Social History Social History   Tobacco Use  . Smoking status: Current Every Day Smoker    Packs/day: 1.00    Types: Cigarettes  . Smokeless tobacco: Never Used  Substance Use Topics  . Alcohol use: No  . Drug use: No     Allergies   Lamictal [lamotrigine]; Morphine and related; Nicotine; Zofran; Chantix [varenicline tartrate]; Codeine; Haldol [haloperidol]; Ibuprofen; Lidoderm [lidocaine]; Metaxalone; Sulfa antibiotics; Toradol [ketorolac tromethamine]; Duloxetine; Gabapentin; Amoxicillin; Aripiprazole; and Penicillins   Review of Systems Review of Systems  All other systems reviewed and are negative.    Physical Exam Updated Vital Signs BP (!) 86/68 (BP Location: Left Arm)   Pulse 68   Temp 98.9 F (37.2 C) (Oral)   Resp 20   Wt 63.5 kg   SpO2 98%   BMI 21.93 kg/m   Physical Exam Vitals signs and nursing note reviewed.  Constitutional:      Appearance: She is well-developed.  HENT:     Head: Normocephalic and atraumatic.  Eyes:     Conjunctiva/sclera: Conjunctivae normal.     Pupils: Pupils are equal, round, and reactive to light.  Neck:     Musculoskeletal: Normal range of motion and neck supple.     Trachea: Phonation normal.  Cardiovascular:     Rate and Rhythm: Normal rate and regular rhythm.  Pulmonary:     Effort: Pulmonary effort is normal.  Abdominal:     Palpations: Abdomen is soft.  Musculoskeletal:     Comments: Patient lying in right lateral decubitus position, with left leg addducted across the right leg, in her position of comfort.  She experiences severe pain when she abducts the left hip and extends the  left upper leg.  Skin:    General: Skin is warm and dry.  Neurological:     Mental Status: She is alert and oriented to person, place, and time.     Motor: No abnormal muscle tone.  Psychiatric:        Mood and Affect: Mood normal.        Behavior: Behavior normal.        Thought Content: Thought content normal.        Judgment: Judgment normal.      ED Treatments / Results  Labs (all labs  ordered are listed, but only abnormal results are displayed) Labs Reviewed - No data to display  EKG None  Radiology No results found.  Procedures Procedures (including critical care time)  Medications Ordered in ED Medications  sodium chloride 0.9 % bolus 1,000 mL (1,000 mLs Intravenous New Bag/Given 12/19/18 1855)  HYDROmorphone (DILAUDID) injection 1 mg (1 mg Intravenous Given 12/19/18 1847)  methylPREDNISolone sodium succinate (SOLU-MEDROL) 125 mg/2 mL injection 125 mg (125 mg Intravenous Given 12/19/18 1846)  promethazine (PHENERGAN) injection 12.5 mg (12.5 mg Intravenous Given 12/19/18 1846)     Initial Impression / Assessment and Plan / ED Course  I have reviewed the triage vital signs and the nursing notes.  Pertinent labs & imaging results that were available during my care of the patient were reviewed by me and considered in my medical decision making (see chart for details).         Patient Vitals for the past 24 hrs:  BP Temp Temp src Pulse Resp SpO2 Weight  12/19/18 1835 (!) 86/68 - - 68 20 98 % -  12/19/18 1745 - - - - - - 63.5 kg  12/19/18 1743 (!) 91/53 98.9 F (37.2 C) Oral 76 18 98 % -    7:15 PM Reevaluation with update and discussion. After initial assessment and treatment, an updated evaluation reveals she states her pain has improved and she is ready to go home.  Findings discussed and questions answered. Daleen Bo   Medical Decision Making: Recurrent sciatica, improved after treatment.  Doubt spinal myelopathy.  Doubt fracture.  CRITICAL  CARE-no Performed by: Daleen Bo  Nursing Notes Reviewed/ Care Coordinated Applicable Imaging Reviewed Interpretation of Laboratory Data incorporated into ED treatment  The patient appears reasonably screened and/or stabilized for discharge and I doubt any other medical condition or other [facility] requiring further screening, evaluation, or treatment in the ED at this time prior to discharge.  Plan: Home Medications-continue usual; Home Treatments-rest, heat therapy; return here if the recommended treatment, does not improve the symptoms; Recommended follow up-PCP and pain management provider, as needed    Final Clinical Impressions(s) / ED Diagnoses   Final diagnoses:  Sciatica, left side    ED Discharge Orders    None       Daleen Bo, MD 12/19/18 1916

## 2018-12-19 NOTE — ED Notes (Signed)
Bed: [facility] Expected date:  Expected time:  Means of arrival:  Comments: EMS 42yo back pain fentanyl and ketamine

## 2018-12-19 NOTE — ED Triage Notes (Addendum)
Pt BIBA from home. Pt has hx of chronic sciatic pain. Pt states that she gets "shots" every 6 months, and has a consult on Thursday, with a shot planned for 12/31/18. Pt received 100 mcg fentanyl and 12.8 mg ketamine en route  18 R AC 20 L hand

## 2019-01-01 ENCOUNTER — Ambulatory Visit (INDEPENDENT_AMBULATORY_CARE_PROVIDER_SITE_OTHER): Payer: Medicare Other | Admitting: Family Medicine

## 2019-01-01 ENCOUNTER — Encounter: Payer: Self-pay | Admitting: Family Medicine

## 2019-01-01 VITALS — BP 96/66 | HR 74 | Temp 99.5°F | Ht 67.0 in | Wt 138.0 lb

## 2019-01-01 DIAGNOSIS — G894 Chronic pain syndrome: Secondary | ICD-10-CM

## 2019-01-01 DIAGNOSIS — Z7689 Persons encountering health services in other specified circumstances: Secondary | ICD-10-CM

## 2019-01-01 DIAGNOSIS — M5432 Sciatica, left side: Secondary | ICD-10-CM

## 2019-01-01 DIAGNOSIS — M1990 Unspecified osteoarthritis, unspecified site: Secondary | ICD-10-CM | POA: Diagnosis not present

## 2019-01-01 DIAGNOSIS — F3160 Bipolar disorder, current episode mixed, unspecified: Secondary | ICD-10-CM

## 2019-01-01 DIAGNOSIS — F17210 Nicotine dependence, cigarettes, uncomplicated: Secondary | ICD-10-CM | POA: Diagnosis not present
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
## 2019-01-01 NOTE — Patient Instructions (Signed)
Living With Bipolar Disorder If you have been diagnosed with bipolar disorder, you may be relieved that you now know why you have felt or behaved a certain way. You may also feel overwhelmed about the treatment ahead, how to get the support you need, and how to deal with the condition day-to-day. With care and support, you can learn to manage your symptoms and live with bipolar disorder. How to manage lifestyle changes Managing stress Stress is your body's reaction to life changes and events, both good and bad. Stress can play a major role in bipolar disorder, so it is important to learn how to cope with stress. Some techniques to cope with stress include:  Meditation, muscle relaxation, and breathing exercises.  Exercise. Even a short daily walk can help to lower stress levels.  Getting enough good-quality sleep. Too little sleep can cause mania to start (can trigger mania).  Making a schedule to manage your time. Knowing your daily schedule can help to keep you from feeling overwhelmed by tasks and deadlines.  Spending time on hobbies that you enjoy.  Medicines Your health care provider may suggest certain medicines if he or she feels that they will help improve your condition. Avoid using caffeine, alcohol, and other substances that may prevent your medicines from working properly (may interact). It is also important to:  Talk with your pharmacist or health care provider about all the medicines that you take, their possible side effects, and which medicines are safe to take together.  Make it your goal to take part in all treatment decisions (shared decision-making). Ask about possible side effects of medicines that your health care provider recommends, and tell him or her how you feel about having those side effects. It is best if shared decision-making with your health care provider is part of your total treatment plan. If you are taking medicines as part of your treatment, do not stop  taking medicines before you ask your health care provider if it is safe to stop. You may need to have the medicine slowly decreased (tapered) over time to decrease the risk of harmful side effects. Relationships Spend time with people that you trust and with whom you feel a sense of understanding and calm. Try to find friends or family members who make you feel safe and can help you control feelings of mania. Consider going to couples counseling, family education classes, or family therapy to:  Educate your loved ones about your condition and offer suggestions about how they can support you.  Help resolve conflicts.  Help develop communication skills in your relationships.  How to recognize changes in your condition Everyone responds differently to treatment for bipolar disorder. Some signs that your condition is improving include:  Leveling of your mood. You may have less anger and excitement about daily activities, and your low moods may not be as bad.  Your symptoms being less intense.  Feeling calm more often.  Thinking clearly.  Not experiencing consequences for extreme behavior.  Feeling like your life is settling down.  Your behavior seeming more normal to you and to other people. Some signs that your condition may be getting worse include:  Sleep problems.  Moods cycling between deep lows and unusually high (excess) energy.  Extreme emotions.  More anger at loved ones.  Staying away from others (isolating yourself).  A feeling of power or superiority.  Completing a lot of tasks in a very short amount of time.  Unusual thoughts and behaviors.  Suicidal  thoughts. Where to find support Talking to others  Try making a list of the people you may want to tell about your condition, such as the people you trust most.  Plan what you are willing to talk about and what you do not want to discuss. Think about your needs ahead of time, and how your friends and family  members can support you.  Let your loved ones know when they can share advice and when you would just like them to listen.  Give your loved ones information about bipolar disorder, and encourage them to learn about the condition. Finances Not all insurance plans cover mental health care, so it is important to check with your insurance carrier. If paying for co-pays or counseling services is a problem, search for a local or county mental health care center. Public mental health care services may be offered there at a low cost or no cost when you are not able to see a private health care provider. If you are taking medicine for depression, you may be able to get the generic form, which may be less expensive than brand-name medicine. Some makers of prescription medicines also offer help to patients who cannot afford the medicines they need. Follow these instructions at home: Medicines  Take over-the-counter and prescription medicines only as told by your health care provider or pharmacist.  Ask your pharmacist what over-the-counter cold medicines you should avoid. Some medicines can make symptoms worse. General instructions  Ask for support from trusted family members or friends to make sure you stay on track with your treatment.  Keep a journal to write down your daily moods, medicines, sleep habits, and life events. This may help you have more success with your treatment.  Make and follow a routine for daily meal times. Eat healthy foods, such as whole grains, vegetables, and fresh fruit.  Try to go to sleep and wake up around the same time every day.  Keep all follow-up visits as told by your health care provider. This is important. Questions to ask your health care provider:  If you are taking medicines: ? How long do I need to take medicine? ? Are there any long-term side effects of my medicine? ? Are there any alternatives to taking medicine?  How would I benefit from  therapy?  How often should I follow up with a health care provider? Contact a health care provider if:  Your symptoms get worse or they do not get better with treatment. Get help right away if:  You have thoughts about harming yourself or others. If you ever feel like you may hurt yourself or others, or have thoughts about taking your own life, get help right away. You can go to your nearest emergency department or call:  Your local emergency services (911 in the [place]  A suicide crisis helpline, such as the [place] at 760-224-4375. This is open 24-hours a day. Summary  Learning ways to deal with stress can help to calm you and may also help your treatment work better.  There is a wide range of medicines that can help to treat bipolar disorder.  Having healthy relationships can help to make your moods more stable.  Contact a health care provider if your symptoms get worse or they do not get better with treatment. This information is not intended to replace advice given to you by your health care provider. Make sure you discuss any questions you have with your health care provider.  Document Released: 02/17/2017 Document Revised: 02/17/2017 Document Reviewed: 02/17/2017 Elsevier Interactive Patient Education  2019 Else[employer] Inc[employer]  Sciatica  Sciatica is pain, numbness, weakness, or tingling along your sciatic nerve. The sciatic nerve starts in the lower back and goes down the back of each leg. Sciatica happens when this nerve is pinched or has pressure put on it. Sciatica usually goes away on its own or with treatment. Sometimes, sciatica may keep coming back (recur). Follow these instructions at home: Medicines  Take over-the-counter and prescription medicines only as told by your doctor.  Do not drive or use heavy machinery while taking prescription pain medicine. Managing pain  If directed, put ice on the affected area. ? Put ice in a plastic  bag. ? Place a towel between your skin and the bag. ? Leave the ice on for 20 minutes, 2-3 times a day.  After icing, apply heat to the affected area before you exercise or as often as told by your doctor. Use the heat source that your doctor tells you to use, such as a moist heat pack or a heating pad. ? Place a towel between your skin and the heat source. ? Leave the heat on for 20-30 minutes. ? Remove the heat if your skin turns bright red. This is especially important if you are unable to feel pain, heat, or cold. You may have a greater risk of getting burned. Activity  Return to your normal activities as told by your doctor. Ask your doctor what activities are safe for you. ? Avoid activities that make your sciatica worse.  Take short rests during the day. Rest in a lying or standing position. This is usually better than sitting to rest. ? When you rest for a long time, do some physical activity or stretching between periods of rest. ? Avoid sitting for a long time without moving. Get up and move around at least one time each hour.  Exercise and stretch regularly, as told by your doctor.  Do not lift anything that is heavier than 10 lb (4.5 kg) while you have symptoms of sciatica. ? Avoid lifting heavy things even when you do not have symptoms. ? Avoid lifting heavy things over and over.  When you lift objects, always lift in a way that is safe for your body. To do this, you should: ? Bend your knees. ? Keep the object close to your body. ? Avoid twisting. General instructions  Use good posture. ? Avoid leaning forward when you are sitting. ? Avoid hunching over when you are standing.  Stay at a healthy weight.  Wear comfortable shoes that support your feet. Avoid wearing high heels.  Avoid sleeping on a mattress that is too soft or too hard. You might have less pain if you sleep on a mattress that is firm enough to support your back.  Keep all follow-up visits as told by  your doctor. This is important. Contact a doctor if:  You have pain that: ? Wakes you up when you are sleeping. ? Gets worse when you lie down. ? Is worse than the pain you have had in the past. ? Lasts longer than 4 weeks.  You lose weight for without trying. Get help right away if:  You cannot control when you pee (urinate) or poop (have a bowel movement).  You have weakness in any of these areas and it gets worse. ? Lower back. ? Lower belly (pelvis). ? Butt (buttocks). ? Legs.  You have redness or  swelling of your back.  You have a burning feeling when you pee. This information is not intended to replace advice given to you by your health care provider. Make sure you discuss any questions you have with your health care provider. Document Released: 07/27/2008 Document Revised: 03/25/2016 Document Reviewed: 06/27/2015 Elsevier Interactive Patient Education  2019 [employer].  Steps to Quit Smoking  Smoking tobacco can be bad for your health. It can also affect almost every organ in your body. Smoking puts you and people around you at risk for many serious long-lasting (chronic) diseases. Quitting smoking is hard, but it is one of the best things that you can do for your health. It is never too late to quit. What are the benefits of quitting smoking? When you quit smoking, you lower your risk for getting serious diseases and conditions. They can include:  Lung cancer or lung disease.  Heart disease.  Stroke.  Heart attack.  Not being able to have children (infertility).  Weak bones (osteoporosis) and broken bones (fractures). If you have coughing, wheezing, and shortness of breath, those symptoms may get better when you quit. You may also get sick less often. If you are pregnant, quitting smoking can help to lower your chances of having a baby of low birth weight. What can I do to help me quit smoking? Talk with your doctor about what can help you quit smoking. Some  things you can do (strategies) include:  Quitting smoking totally, instead of slowly cutting back how much you smoke over a period of time.  Going to in-person counseling. You are more likely to quit if you go to many counseling sessions.  Using resources and support systems, such as: ? Database administrator with a Social worker. ? Phone quitlines. ? Careers information officer. ? Support groups or group counseling. ? Text messaging programs. ? Mobile phone apps or applications.  Taking medicines. Some of these medicines may have nicotine in them. If you are pregnant or breastfeeding, do not take any medicines to quit smoking unless your doctor says it is okay. Talk with your doctor about counseling or other things that can help you. Talk with your doctor about using more than one strategy at the same time, such as taking medicines while you are also going to in-person counseling. This can help make quitting easier. What things can I do to make it easier to quit? Quitting smoking might feel very hard at first, but there is a lot that you can do to make it easier. Take these steps:  Talk to your family and friends. Ask them to support and encourage you.  Call phone quitlines, reach out to support groups, or work with a Social worker.  Ask people who smoke to not smoke around you.  Avoid places that make you want (trigger) to smoke, such as: ? Bars. ? Parties. ? Smoke-break areas at work.  Spend time with people who do not smoke.  Lower the stress in your life. Stress can make you want to smoke. Try these things to help your stress: ? Getting regular exercise. ? Deep-breathing exercises. ? Yoga. ? Meditating. ? Doing a body scan. To do this, close your eyes, focus on one area of your body at a time from head to toe, and notice which parts of your body are tense. Try to relax the muscles in those areas.  Download or buy apps on your mobile phone or tablet that can help you stick to your quit plan.  There  are many free apps, such as QuitGuide from the [employer] Office manager for Disease Control and Prevention). You can find more support from smokefree.gov and other websites. This information is not intended to replace advice given to you by your health care provider. Make sure you discuss any questions you have with your health care provider. Document Released: 08/14/2009 Document Revised: 06/15/2016 Document Reviewed: 03/04/2015 Elsevier Interactive Patient Education  2019 [employer].

## 2019-01-02 ENCOUNTER — Encounter: Payer: Self-pay | Admitting: Family Medicine

## 2019-01-02 DIAGNOSIS — F3160 Bipolar disorder, current episode mixed, unspecified: Secondary | ICD-10-CM | POA: Insufficient documentation

## 2019-01-02 DIAGNOSIS — M1990 Unspecified osteoarthritis, unspecified site: Secondary | ICD-10-CM | POA: Insufficient documentation

## 2019-01-02 DIAGNOSIS — F17210 Nicotine dependence, cigarettes, uncomplicated: Secondary | ICD-10-CM | POA: Insufficient documentation

## 2019-01-04 ENCOUNTER — Emergency Department (HOSPITAL_COMMUNITY)
Admission: EM | Admit: 2019-01-04 | Discharge: 2019-01-05 | Disposition: A | Discharge status: Home / Self Care | Payer: Medicare Other | Attending: Emergency Medicine | Admitting: Emergency Medicine

## 2019-01-04 DIAGNOSIS — M5432 Sciatica, left side: Secondary | ICD-10-CM | POA: Insufficient documentation

## 2019-01-04 DIAGNOSIS — M549 Dorsalgia, unspecified: Secondary | ICD-10-CM | POA: Diagnosis present

## 2019-01-04 DIAGNOSIS — F17210 Nicotine dependence, cigarettes, uncomplicated: Secondary | ICD-10-CM | POA: Insufficient documentation

## 2019-01-04 DIAGNOSIS — Z79899 Other long term (current) drug therapy: Secondary | ICD-10-CM | POA: Insufficient documentation

## 2019-01-04 NOTE — ED Provider Notes (Signed)
[facility] DEPT Provider Note   CSN: [id number] Arrival date & time: 01/04/19  2328    History   Chief Complaint Chief Complaint  Patient presents with  . Leg Pain    HPI Gina [name] is a 43 y.o. female.   The history is provided by the patient.  She has history of chronic back pain, bipolar disorder and comes in with worsening of her chronic left-sided sciatica.  He had gynecologic surgery yesterday, and thinks that the position that she was putting aggravated her back.  She is complaining of pain in the left lumbar area radiating down her left leg all the way down to the heel.  There is some vague numbness and she states that her leg gave out on her when she tried to walk.  She denies any bowel or bladder dysfunction.  She takes oxycodone-acetaminophen 7.5-325 and methocarbamol which have not given her any relief.  She came in by ambulance and was given fentanyl with no relief, given ketamine with slight relief.  She will not put a number on her pain but states that it is severe.  Is worse with any movement.  Nothing makes it better.  She is also complaining of nausea.  Past Medical History:  Diagnosis Date  . ABDOMINAL WALL HERNIA 02/27/2010  . Abnormal Pap smear   . ALLERGIC RHINITIS 06/02/2007  . ANOREXIA, CHRONIC 08/07/2008  . ANXIETY 09/15/2010  . Arthritis   . BACK PAIN, THORACIC REGION 07/07/2007  . Bipolar disorder ([place])   . CERVICAL RADICULOPATHY 12/11/2008  . Condyloma acuminatum 04/23/2009  . CONSTIPATION 09/15/2010  . DYSPHAGIA UNSPECIFIED 09/09/2009  . 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 Eating disorder   . ENDOMETRIOSIS 12/16/2009  . FATIGUE 06/11/2010  . Fibromyalgia   . GERD 10/23/2009  . Heart palpitations   . HERPES SIMPLEX INFECTION 06/11/2010  . LENTIGO 04/23/2009  . LUNG NODULE 06/17/2010  . Narcotic abuse, continuous ([place])   . Ovarian cyst   . Palpitations 10/23/2009  . Panic disorder   . Rheumatoid arthritis(714.0)   .  Stricture and stenosis of esophagus 05/13/2010  . TOBACCO ABUSE 08/07/2008  . Urinary tract infection   . UTI (urinary tract infection)     Patient Active Problem List   Diagnosis Date Noted  . Bipolar affective disorder, current episode mixed ([place]) 01/02/2019  . Arthritis 01/02/2019  . Cigarette nicotine dependence without complication 30/05/6225  . Bipolar 1 disorder, mixed, moderate ([facility]) 07/15/2015    Class: Chronic  . 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 Sciatica 01/09/2015  . Reactive hypoglycemia 12/17/2013  . Musculoskeletal malfunction arising from mental factors 04/04/2013  . Incisional hernia 11/15/2012  . Other postprocedural status(V45.89) 11/15/2012  . Abdominal pain 11/11/2012  . Chronic pain syndrome 11/11/2012  . Disorder of sacrum 11/11/2012  . Heartburn 10/04/2011  . DYSPHAGIA 10/04/2011  . Depression with anxiety 09/15/2010  . CONSTIPATION 09/15/2010  . LUNG NODULE 06/17/2010  . HERPES SIMPLEX INFECTION 06/11/2010  . FATIGUE 06/11/2010  . STRICTURE AND STENOSIS OF ESOPHAGUS 05/13/2010  . ABDOMINAL WALL HERNIA 02/27/2010  . ENDOMETRIOSIS 12/16/2009  . GERD 10/23/2009  . PALPITATIONS 10/23/2009  . 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 CONDYLOMA ACUMINATUM 04/23/2009  . LENTIGO 04/23/2009  . DENTAL PAIN 03/19/2009  . CERVICAL RADICULOPATHY 12/11/2008  . 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 TOBACCO ABUSE 08/07/2008  . BACK PAIN, THORACIC REGION 07/07/2007  . ALLERGIC RHINITIS 06/02/2007  Past Surgical History:  Procedure Laterality Date  . ABDOMINAL SURGERY    . ABDOMINAL WALL MESH  REMOVAL    . ABLATION ON ENDOMETRIOSIS    . CESAREAN SECTION     x2   . DILATION AND CURETTAGE OF UTERUS     x1   . ENDOMETRIAL ABLATION    . esophageal dilatation x 4    . GUM SURGERY    . HERNIA REPAIR     X3  . PARTIAL HYSTERECTOMY       OB History    Gravida  3   Para  2   Term    2   Preterm  0   AB  1   Living  2     SAB  1   TAB  0   Ectopic  0   Multiple  0   Live Births  1            Home Medications    Prior to Admission medications   Medication Sig Start Date End Date Taking? Authorizing Provider  clonazePAM (KLONOPIN) 0.5 MG tablet Take 0.5 mg by mouth 3 (three) times daily. 08/30/18   [provider]  Escitalopram Oxalate (LEXAPRO PO) Take 30 mg by mouth daily.    [provider]  lurasidone (LATUDA) 80 MG TABS tablet Take 80 mg by mouth at bedtime.     [provider]  naloxegol oxalate (MOVANTIK) 25 MG TABS tablet Take 25 mg by mouth daily.    [provider]  oxyCODONE-acetaminophen (PERCOCET) 7.5-325 MG tablet Take 1 tablet by mouth 4 (four) times daily.    [provider]    Family History Family History  Problem Relation Age of Onset  . Depression Mother   . Arthritis Mother   . Hyperlipidemia Father   . Hypertension Father     Social History Social History   Tobacco Use  . Smoking status: Current Every Day Smoker    Packs/day: 1.00    Types: Cigarettes  . Smokeless tobacco: Never Used  Substance Use Topics  . Alcohol use: No  . Drug use: No     Allergies   Lamictal [lamotrigine]; Morphine and related; Nicotine; Zofran; Chantix [varenicline tartrate]; Codeine; Haldol [haloperidol]; Ibuprofen; Lidoderm [lidocaine]; Metaxalone; Sulfa antibiotics; Toradol [ketorolac tromethamine]; Duloxetine; Gabapentin; Amoxicillin; Aripiprazole; and Penicillins   Review of Systems Review of Systems  All other systems reviewed and are negative.    Physical Exam Updated Vital Signs BP 92/63 (BP Location: Left Arm)   Pulse 73   Resp 16   LMP 01/01/2004   SpO2 98%   Physical Exam Vitals signs and nursing note reviewed.    43 year old female, appears uncomfortable lying on the right lateral decubitus position with hips and knees flexed, but is in no acute distress. Vital  signs are normal. Oxygen saturation is 98%, which is normal. Head is normocephalic and atraumatic. PERRLA, EOMI. Oropharynx is clear. Neck is nontender and supple without adenopathy or JVD. Back is nontender and there is no CVA tenderness.  Straight leg raise is positive on the left with virtually any movement of the left leg. Lungs are clear without rales, wheezes, or rhonchi. Chest is nontender. Heart has regular rate and rhythm without murmur. Abdomen is soft, flat, nontender without masses or hepatosplenomegaly and peristalsis is normoactive. Extremities have no cyanosis or edema, full range of motion is present. Skin is warm and dry without rash. Neurologic: Mental status is normal, cranial nerves  are intact.  She has weakness of the left leg, but this appears to be largely effort dependent, difficult to assess if there is true weakness.  ED Treatments / Results   Procedures Procedures   Medications Ordered in ED Medications  methocarbamol (ROBAXIN) 1,000 mg in dextrose 5 % 50 mL IVPB (has no administration in time range)  dexamethasone (DECADRON) injection 10 mg (has no administration in time range)  HYDROmorphone (DILAUDID) injection 1 mg (1 mg Intravenous Given 01/05/19 0039)  promethazine (PHENERGAN) injection 25 mg (25 mg Intravenous Given 01/05/19 0035)     Initial Impression / Assessment and Plan / ED Course  I have reviewed the triage vital signs and the nursing notes.  Exacerbation of chronic left sciatic pain.  No evidence of significant neurologic injury.  Old records are reviewed, and she has numerous ED visits with similar complaints.  She will be given hydromorphone, dexamethasone, methocarbamol, promethazine.  She got excellent relief from her symptoms with above-noted treatment and is discharged to resume her usual medication regimen.  Follow-up with her PCP and with her neurologist.  Final Clinical Impressions(s) / ED Diagnoses   Final diagnoses:  Sciatica of  left side    ED Discharge Orders    None       Delora Fuel, MD 90/93/11 [phone]

## 2019-01-04 NOTE — ED Notes (Signed)
Bed: LT64 Expected date:  Expected time:  Means of arrival:  Comments: Sciatica/ketamine drip

## 2019-01-04 NOTE — ED Triage Notes (Addendum)
Pt BIB [facility] from home.  Pt has hx of sciatica of the left leg. Tonight pt is experiencing spasms and pain shooting down that leg. Pt gets shots for sciatica that lasts for 6 months with a steroid. Pt received Ketamine drip enroute to ED pt received 4 min of 127mg  instead of 12.7mg  once error was realized drip was DC'd at 2307. Pt also received 183mcg of fentanyl prior to ketamine with no relief. Pt CAOx4 upon arrival. Pt actively vomiting during triage.

## 2019-01-05 MED ORDER — METHOCARBAMOL 1000 MG/10ML IJ SOLN
1000.0000 mg | Freq: Once | INTRAVENOUS | Status: AC
  Administered 2019-01-05: 1000 mg via INTRAVENOUS
  Filled 2019-01-05: qty 10

## 2019-01-05 MED ORDER — PROMETHAZINE HCL 25 MG/ML IJ SOLN
25.0000 mg | Freq: Once | INTRAMUSCULAR | Status: AC
  Administered 2019-01-05: 25 mg via INTRAVENOUS
  Filled 2019-01-05: qty 1

## 2019-01-05 MED ORDER — DEXAMETHASONE SODIUM PHOSPHATE 10 MG/ML IJ SOLN
10.0000 mg | Freq: Once | INTRAMUSCULAR | Status: AC
  Administered 2019-01-05: 10 mg via INTRAVENOUS
  Filled 2019-01-05: qty 1

## 2019-01-05 MED ORDER — HYDROMORPHONE HCL 1 MG/ML IJ SOLN
1.0000 mg | Freq: Once | INTRAMUSCULAR | Status: AC
  Administered 2019-01-05: 1 mg via INTRAVENOUS
  Filled 2019-01-05: qty 1

## 2019-01-05 NOTE — Discharge Instructions (Addendum)
Resume your usual medications.

## 2019-01-08 ENCOUNTER — Emergency Department (HOSPITAL_COMMUNITY)
Admission: EM | Admit: 2019-01-08 | Discharge: 2019-01-09 | Disposition: A | Discharge status: Home / Self Care | Payer: Medicare Other | Attending: Emergency Medicine | Admitting: Emergency Medicine

## 2019-01-08 ENCOUNTER — Encounter (HOSPITAL_COMMUNITY): Payer: Self-pay | Admitting: Emergency Medicine

## 2019-01-08 DIAGNOSIS — Z79899 Other long term (current) drug therapy: Secondary | ICD-10-CM | POA: Insufficient documentation

## 2019-01-08 DIAGNOSIS — M5432 Sciatica, left side: Secondary | ICD-10-CM | POA: Diagnosis not present

## 2019-01-08 DIAGNOSIS — F17210 Nicotine dependence, cigarettes, uncomplicated: Secondary | ICD-10-CM | POA: Insufficient documentation

## 2019-01-08 DIAGNOSIS — M545 Low back pain: Secondary | ICD-10-CM | POA: Diagnosis present

## 2019-01-08 NOTE — ED Triage Notes (Signed)
Pt comes to ed, chronic pain med control, 7:30 pm tonight took her home oxycodone, no pain relief. Left side back pain, nausea. V/s on bp 90/56, hr 88, spo2 98,  Rr18.

## 2019-01-08 NOTE — ED Notes (Addendum)
Ems no IV access, 3 attempts  no pain meds given

## 2019-01-08 NOTE — ED Notes (Signed)
Bed: QH60 Expected date:  Expected time:  Means of arrival:  Comments: 43 yo F/given fentanyl for pain

## 2019-01-09 MED ORDER — DEXAMETHASONE SODIUM PHOSPHATE 10 MG/ML IJ SOLN
10.0000 mg | Freq: Once | INTRAMUSCULAR | Status: AC
  Administered 2019-01-09: 10 mg via INTRAVENOUS
  Filled 2019-01-09: qty 1

## 2019-01-09 MED ORDER — HYDROMORPHONE HCL 1 MG/ML IJ SOLN
1.0000 mg | Freq: Once | INTRAMUSCULAR | Status: AC
  Administered 2019-01-09: 1 mg via INTRAVENOUS
  Filled 2019-01-09: qty 1

## 2019-01-09 MED ORDER — PROMETHAZINE HCL 25 MG/ML IJ SOLN
12.5000 mg | Freq: Once | INTRAMUSCULAR | Status: AC
  Administered 2019-01-09: 12.5 mg via INTRAVENOUS
  Filled 2019-01-09: qty 1

## 2019-01-09 NOTE — ED Provider Notes (Signed)
[facility] DEPT Provider Note  CSN: [id number] Arrival date & time: 01/08/19 2259  Chief Complaint(s) Back Pain  HPI Gina [name] is a 43 y.o. female with a history of chronic left sciatica who presents to the emergency department with exacerbation of sciatica.  Pain is severe deep ache.  Pain radiates down her left leg. Pain exacerbated with movement and ambulation.  Alleviated by certain positions. Patient denies any trauma.  No fevers or chills.  No urinary symptoms.  Reports that she has been getting injections and is due for 1 next week.  No associated weakness or loss of sensation.  No bladder/bowel incontinence.     HPI  Past Medical History Past Medical History:  Diagnosis Date  . ABDOMINAL WALL HERNIA 02/27/2010  . Abnormal Pap smear   . ALLERGIC RHINITIS 06/02/2007  . ANOREXIA, CHRONIC 08/07/2008  . ANXIETY 09/15/2010  . Arthritis   . BACK PAIN, THORACIC REGION 07/07/2007  . Bipolar disorder ([place])   . CERVICAL RADICULOPATHY 12/11/2008  . Condyloma acuminatum 04/23/2009  . CONSTIPATION 09/15/2010  . DYSPHAGIA UNSPECIFIED 09/09/2009  . 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 Eating disorder   . ENDOMETRIOSIS 12/16/2009  . FATIGUE 06/11/2010  . Fibromyalgia   . GERD 10/23/2009  . Heart palpitations   . HERPES SIMPLEX INFECTION 06/11/2010  . LENTIGO 04/23/2009  . LUNG NODULE 06/17/2010  . Narcotic abuse, continuous ([place])   . Ovarian cyst   . Palpitations 10/23/2009  . Panic disorder   . Rheumatoid arthritis(714.0)   . Stricture and stenosis of esophagus 05/13/2010  . TOBACCO ABUSE 08/07/2008  . Urinary tract infection   . UTI (urinary tract infection)    Patient Active Problem List   Diagnosis Date Noted  . Bipolar affective disorder, current episode mixed ([place]) 01/02/2019  . Arthritis 01/02/2019  . Cigarette nicotine dependence without complication 51/12/5850  . Bipolar 1 disorder, mixed, moderate ([facility]) 07/15/2015    Class: Chronic  .  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 Sciatica 01/09/2015  . Reactive hypoglycemia 12/17/2013  . Musculoskeletal malfunction arising from mental factors 04/04/2013  . Incisional hernia 11/15/2012  . Other postprocedural status(V45.89) 11/15/2012  . Abdominal pain 11/11/2012  . Chronic pain syndrome 11/11/2012  . Disorder of sacrum 11/11/2012  . Heartburn 10/04/2011  . DYSPHAGIA 10/04/2011  . Depression with anxiety 09/15/2010  . CONSTIPATION 09/15/2010  . LUNG NODULE 06/17/2010  . HERPES SIMPLEX INFECTION 06/11/2010  . FATIGUE 06/11/2010  . STRICTURE AND STENOSIS OF ESOPHAGUS 05/13/2010  . ABDOMINAL WALL HERNIA 02/27/2010  . ENDOMETRIOSIS 12/16/2009  . GERD 10/23/2009  . PALPITATIONS 10/23/2009  . 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 CONDYLOMA ACUMINATUM 04/23/2009  . LENTIGO 04/23/2009  . DENTAL PAIN 03/19/2009  . CERVICAL RADICULOPATHY 12/11/2008  . 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 TOBACCO ABUSE 08/07/2008  . BACK PAIN, THORACIC REGION 07/07/2007  . ALLERGIC RHINITIS 06/02/2007   Home Medication(s) Prior to Admission medications   Medication Sig Start Date End Date Taking? Authorizing Provider  clonazePAM (KLONOPIN) 0.5 MG tablet Take 0.5 mg by mouth 3 (three) times daily. 08/30/18  Yes [provider]  escitalopram (LEXAPRO) 20 MG tablet Take 30 mg by mouth at bedtime. 12/20/18  Yes [provider]  lurasidone (LATUDA) 80 MG TABS tablet Take 80 mg by mouth at bedtime.    Yes [provider]  naloxegol oxalate (MOVANTIK) 25 MG TABS tablet Take 25 mg by mouth daily.  Yes [provider]  oxyCODONE-acetaminophen (PERCOCET) 7.5-325 MG tablet Take 1 tablet by mouth 4 (four) times daily.   Yes [provider]                                                                                                                                    Past Surgical  History Past Surgical History:  Procedure Laterality Date  . ABDOMINAL SURGERY    . ABDOMINAL WALL MESH  REMOVAL    . ABLATION ON ENDOMETRIOSIS    . CESAREAN SECTION     x2   . DILATION AND CURETTAGE OF UTERUS     x1   . ENDOMETRIAL ABLATION    . esophageal dilatation x 4    . GUM SURGERY    . HERNIA REPAIR     X3  . PARTIAL HYSTERECTOMY     Family History Family History  Problem Relation Age of Onset  . Depression Mother   . Arthritis Mother   . Hyperlipidemia Father   . Hypertension Father     Social History Social History   Tobacco Use  . Smoking status: Current Every Day Smoker    Packs/day: 1.00    Types: Cigarettes  . Smokeless tobacco: Never Used  Substance Use Topics  . Alcohol use: No  . Drug use: No   Allergies Lamictal [lamotrigine]; Morphine and related; Nicotine; Zofran; Chantix [varenicline tartrate]; Codeine; Haldol [haloperidol]; Ibuprofen; Lidoderm [lidocaine]; Metaxalone; Sulfa antibiotics; Sulfasalazine; Toradol [ketorolac tromethamine]; Duloxetine; Gabapentin; Methadone; Prednisone; Pregabalin; Zolpidem; Amoxicillin; Aripiprazole; and Penicillins  Review of Systems Review of Systems All other systems are reviewed and are negative for acute change except as noted in the HPI  Physical Exam Vital Signs  I have reviewed the triage vital signs BP 97/63 (BP Location: Right Arm)   Pulse 61   Temp 98.6 F (37 C) (Oral)   Resp 16   Ht 5\' 7"  (1.702 m)   Wt 65.8 kg   LMP 01/01/2004   SpO2 97%   BMI 22.71 kg/m   Physical Exam Vitals signs reviewed.  Constitutional:      General: She is not in acute distress.    Appearance: She is well-developed. She is not diaphoretic.  HENT:     Head: Normocephalic and atraumatic.     Right Ear: External ear normal.     Left Ear: External ear normal.     Nose: Nose normal.  Eyes:     General: No scleral icterus.    Conjunctiva/sclera: Conjunctivae normal.  Neck:     Musculoskeletal: Normal range of  motion.     Trachea: Phonation normal.  Cardiovascular:     Rate and Rhythm: Normal rate and regular rhythm.  Pulmonary:     Effort: Pulmonary effort is normal. No respiratory distress.     Breath sounds: No stridor.  Abdominal:     General: There is no distension.  Musculoskeletal: Normal  range of motion.     Lumbar back: She exhibits tenderness.       Back:  Neurological:     Mental Status: She is alert and oriented to person, place, and time.  Psychiatric:        Behavior: Behavior normal.     ED Results and Treatments Labs (all labs ordered are listed, but only abnormal results are displayed) Labs Reviewed - No data to display                                                                                                                       EKG  EKG Interpretation  Date/Time:    Ventricular Rate:    PR Interval:    QRS Duration:   QT Interval:    QTC Calculation:   R Axis:     Text Interpretation:        Radiology No results found. Pertinent labs & imaging results that were available during my care of the patient were reviewed by me and considered in my medical decision making (see chart for details).  Medications Ordered in ED Medications  HYDROmorphone (DILAUDID) injection 1 mg (1 mg Intravenous Given 01/09/19 0219)  promethazine (PHENERGAN) injection 12.5 mg (12.5 mg Intravenous Given 01/09/19 0220)  dexamethasone (DECADRON) injection 10 mg (10 mg Intravenous Given 01/09/19 0220)                                                                                                                                    Procedures Procedures  (including critical care time)  Medical Decision Making / ED Course I have reviewed the nursing notes for this encounter and the patient's prior records (if available in EHR or on provided paperwork).    Exacerbation of patient's left sciatica.  Provided with analgesia resulting in improved symptoms.  The patient appears  reasonably screened and/or stabilized for discharge and I doubt any other medical condition or other [facility] requiring further screening, evaluation, or treatment in the ED at this time prior to discharge.  The patient is safe for discharge with strict return precautions.   Final Clinical Impression(s) / ED Diagnoses Final diagnoses:  Sciatica of left side    Disposition: Discharge  Condition: Good  I have discussed the results, Dx and Tx plan with the patient who expressed understanding and agree(s) with the plan. Discharge instructions discussed at great length. The patient was given  strict return precautions who verbalized understanding of the instructions. No further questions at time of discharge.    ED Discharge Orders    None       Follow Up: Billie Ruddy, MD [place] [phone]   As needed     This chart was dictated using voice recognition software.  Despite best efforts to proofread,  errors can occur which can change the documentation meaning.   Fatima Blank, MD 01/09/19 [phone]

## 2019-01-14 ENCOUNTER — Encounter (HOSPITAL_COMMUNITY): Payer: Self-pay

## 2019-01-14 ENCOUNTER — Emergency Department (HOSPITAL_COMMUNITY)
Admission: EM | Admit: 2019-01-14 | Discharge: 2019-01-14 | Disposition: A | Discharge status: Home / Self Care | Payer: Medicare Other | Attending: Emergency Medicine | Admitting: Emergency Medicine

## 2019-01-14 DIAGNOSIS — M069 Rheumatoid arthritis, unspecified: Secondary | ICD-10-CM | POA: Diagnosis not present

## 2019-01-14 DIAGNOSIS — F17210 Nicotine dependence, cigarettes, uncomplicated: Secondary | ICD-10-CM | POA: Insufficient documentation

## 2019-01-14 DIAGNOSIS — G8929 Other chronic pain: Secondary | ICD-10-CM | POA: Insufficient documentation

## 2019-01-14 DIAGNOSIS — M5442 Lumbago with sciatica, left side: Secondary | ICD-10-CM | POA: Insufficient documentation

## 2019-01-14 DIAGNOSIS — R112 Nausea with vomiting, unspecified: Secondary | ICD-10-CM | POA: Insufficient documentation

## 2019-01-14 DIAGNOSIS — M5432 Sciatica, left side: Secondary | ICD-10-CM

## 2019-01-14 DIAGNOSIS — Z79899 Other long term (current) drug therapy: Secondary | ICD-10-CM | POA: Insufficient documentation

## 2019-01-14 DIAGNOSIS — M5430 Sciatica, unspecified side: Secondary | ICD-10-CM | POA: Diagnosis present

## 2019-01-14 MED ORDER — DEXAMETHASONE SODIUM PHOSPHATE 10 MG/ML IJ SOLN
10.0000 mg | Freq: Once | INTRAMUSCULAR | Status: AC
  Administered 2019-01-14: 10 mg via INTRAVENOUS
  Filled 2019-01-14: qty 1

## 2019-01-14 MED ORDER — PROMETHAZINE HCL 25 MG PO TABS: 25.0000 mg | ORAL_TABLET | Freq: Four times a day (QID) | ORAL | 0 refills | Status: DC | PRN

## 2019-01-14 MED ORDER — FENTANYL CITRATE (PF) 100 MCG/2ML IJ SOLN
50.0000 ug | Freq: Once | INTRAMUSCULAR | Status: AC
  Administered 2019-01-14: 50 ug via INTRAVENOUS
  Filled 2019-01-14: qty 2

## 2019-01-14 MED ORDER — METHOCARBAMOL 1000 MG/10ML IJ SOLN
1000.0000 mg | Freq: Once | INTRAVENOUS | Status: AC
  Administered 2019-01-14: 1000 mg via INTRAVENOUS
  Filled 2019-01-14: qty 10

## 2019-01-14 MED ORDER — PROMETHAZINE HCL 25 MG/ML IJ SOLN
12.5000 mg | Freq: Once | INTRAMUSCULAR | Status: AC
  Administered 2019-01-14: 12.5 mg via INTRAVENOUS
  Filled 2019-01-14: qty 1

## 2019-01-14 MED ORDER — METHOCARBAMOL 500 MG PO TABS
750.0000 mg | ORAL_TABLET | Freq: Once | ORAL | Status: DC
  Filled 2019-01-14: qty 2

## 2019-01-14 NOTE — ED Triage Notes (Signed)
Pt arrived via [facility] due to chronic sciatica pain. Per pt, her PCP changed her medication this week. 122mcg of fentanyl and 14mg  of ketamine given en route.

## 2019-01-14 NOTE — ED Provider Notes (Signed)
[facility] DEPT Provider Note   CSN: [id number] Arrival date & time: 01/14/19  1918    History   Chief Complaint Chief Complaint  Patient presents with  . Sciatica    HPI    Gina [name] is a 43 y.o. female with a PMHx of chronic back pain, sciatica, fibromyalgia, and other conditions listed below, who presents to the ED with complaints of acute on chronic sciatica pain.  Patient states this feels like her sciatica, worsening today.  She states that 3 days ago she had "an injection in her back" of a steroid which was "different than her usual injection", and she states that it is not working; she reports that this was done by her neurologist who cares for her chronic back pain/sciatica.  She reports that today's symptoms feels just like prior sciatica flareups.  She describes the pain as 10/10 constant stabbing left lower back/buttock pain that radiates down her entire left leg, worse with walking, unrelieved with home oxycodone 7.5mg  and Robaxin, and somewhat improved with fentanyl 100 mcg and ketamine 14 mg given in route by EMS.  She states that that has "worn off by now" and she is requesting a specific "cocktail" of medications that always works for her (IV robaxin, phenergan, decadron, and dilaudid).  She reports associated nausea and vomiting that she attributes to the fact that she is in pain, she states she has had about 4-5 episodes of nonbloody nonbilious emesis today, but she clarifies that she's "not sick or anything".  She also reports left leg weakness that feels like her leg is "giving out" when she tries to ambulate, she states that this happens when her sciatica flares up and is not a new issue.  She also reports some tingling in her left foot and lower leg which is also similar to prior sciatica flareups.   Of note, chart review reveals she was seen in the ED on 01/04/19 for similar sciatica complaints, received phenergan 25mg  IV, dilaudid 1mg   IV, dexamethasone 10mg  IV, and robaxin 1000mg  IV; she was again seen on 01/08/19 for same, received phenergan 12.5mg  IV, dilaudid 1mg  IV, and dexamethasone 10mg  IV.  She states these are the medications that "always work" and she is requesting them today.  Of note, she states her BP is always low.   She denies any fevers, chills, chest pain, shortness breath, abdominal pain, diarrhea, constipation, hematemesis, dysuria, hematuria, incontinence of urine or stool, saddle anesthesia or cauda equina symptoms, numbness, or any other complaints at this time.  The history is provided by the patient and medical records. No language interpreter was used.    Past Medical History:  Diagnosis Date  . ABDOMINAL WALL HERNIA 02/27/2010  . Abnormal Pap smear   . ALLERGIC RHINITIS 06/02/2007  . ANOREXIA, CHRONIC 08/07/2008  . ANXIETY 09/15/2010  . Arthritis   . BACK PAIN, THORACIC REGION 07/07/2007  . Bipolar disorder ([place])   . CERVICAL RADICULOPATHY 12/11/2008  . Condyloma acuminatum 04/23/2009  . CONSTIPATION 09/15/2010  . DYSPHAGIA UNSPECIFIED 09/09/2009  . 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 Eating disorder   . ENDOMETRIOSIS 12/16/2009  . FATIGUE 06/11/2010  . Fibromyalgia   . GERD 10/23/2009  . Heart palpitations   . HERPES SIMPLEX INFECTION 06/11/2010  . LENTIGO 04/23/2009  . LUNG NODULE 06/17/2010  . Narcotic abuse, continuous ([place])   . Ovarian cyst   . Palpitations 10/23/2009  . Panic disorder   . Rheumatoid arthritis(714.0)   . Stricture and stenosis  of esophagus 05/13/2010  . TOBACCO ABUSE 08/07/2008  . Urinary tract infection   . UTI (urinary tract infection)     Patient Active Problem List   Diagnosis Date Noted  . Bipolar affective disorder, current episode mixed ([place]) 01/02/2019  . Arthritis 01/02/2019  . Cigarette nicotine dependence without complication 64/33/2951  . Bipolar 1 disorder, mixed, moderate ([facility]) 07/15/2015    Class: Chronic  . 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 Sciatica 01/09/2015  . Reactive hypoglycemia 12/17/2013  . Musculoskeletal malfunction arising from mental factors 04/04/2013  . Incisional hernia 11/15/2012  . Other postprocedural status(V45.89) 11/15/2012  . Abdominal pain 11/11/2012  . Chronic pain syndrome 11/11/2012  . Disorder of sacrum 11/11/2012  . Heartburn 10/04/2011  . DYSPHAGIA 10/04/2011  . Depression with anxiety 09/15/2010  . CONSTIPATION 09/15/2010  . LUNG NODULE 06/17/2010  . HERPES SIMPLEX INFECTION 06/11/2010  . FATIGUE 06/11/2010  . STRICTURE AND STENOSIS OF ESOPHAGUS 05/13/2010  . ABDOMINAL WALL HERNIA 02/27/2010  . ENDOMETRIOSIS 12/16/2009  . GERD 10/23/2009  . PALPITATIONS 10/23/2009  . 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 CONDYLOMA ACUMINATUM 04/23/2009  . LENTIGO 04/23/2009  . DENTAL PAIN 03/19/2009  . CERVICAL RADICULOPATHY 12/11/2008  . 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 TOBACCO ABUSE 08/07/2008  . BACK PAIN, THORACIC REGION 07/07/2007  . ALLERGIC RHINITIS 06/02/2007    Past Surgical History:  Procedure Laterality Date  . ABDOMINAL SURGERY    . ABDOMINAL WALL MESH  REMOVAL    . ABLATION ON ENDOMETRIOSIS    . CESAREAN SECTION     x2   . DILATION AND CURETTAGE OF UTERUS     x1   . ENDOMETRIAL ABLATION    . esophageal dilatation x 4    . GUM SURGERY    . HERNIA REPAIR     X3  . PARTIAL HYSTERECTOMY       OB History    Gravida  3   Para  2   Term  2   Preterm  0   AB  1   Living  2     SAB  1   TAB  0   Ectopic  0   Multiple  0   Live Births  1            Home Medications    Prior to Admission medications   Medication Sig Start Date End Date Taking? Authorizing Provider  clonazePAM (KLONOPIN) 0.5 MG tablet Take 0.5 mg by mouth 3 (three) times daily. 08/30/18   [provider]  escitalopram (LEXAPRO) 20 MG tablet Take 30 mg by mouth at bedtime. 12/20/18   [provider]   lurasidone (LATUDA) 80 MG TABS tablet Take 80 mg by mouth at bedtime.     [provider]  naloxegol oxalate (MOVANTIK) 25 MG TABS tablet Take 25 mg by mouth daily.    [provider]  oxyCODONE-acetaminophen (PERCOCET) 7.5-325 MG tablet Take 1 tablet by mouth 4 (four) times daily.    [provider]    Family History Family History  Problem Relation Age of Onset  . Depression Mother   . Arthritis Mother   . Hyperlipidemia Father   . Hypertension Father     Social History Social History   Tobacco Use  . Smoking status: Current Every Day Smoker    Packs/day: 1.00    Types: Cigarettes  . Smokeless tobacco: Never Used  Substance Use Topics  .  Alcohol use: No  . Drug use: No     Allergies   Lamictal [lamotrigine]; Morphine and related; Nicotine; Zofran; Chantix [varenicline tartrate]; Codeine; Haldol [haloperidol]; Ibuprofen; Lidoderm [lidocaine]; Metaxalone; Sulfa antibiotics; Sulfasalazine; Toradol [ketorolac tromethamine]; Duloxetine; Gabapentin; Methadone; Prednisone; Pregabalin; Zolpidem; Amoxicillin; Aripiprazole; and Penicillins   Review of Systems Review of Systems  Constitutional: Negative for chills and fever.  Respiratory: Negative for shortness of breath.   Cardiovascular: Negative for chest pain.  Gastrointestinal: Positive for nausea and vomiting. Negative for abdominal pain, constipation and diarrhea.  Genitourinary: Negative for difficulty urinating (no incontinence), dysuria and hematuria.  Musculoskeletal: Positive for back pain and myalgias.  Skin: Negative for color change.  Allergic/Immunologic: Negative for immunocompromised state.  Neurological: Positive for weakness (L leg). Negative for numbness.       +tingling L leg  Psychiatric/Behavioral: Negative for confusion.   All other systems reviewed and are negative for acute change except as noted in the HPI.    Physical Exam Updated Vital Signs BP (!) 95/54 (BP Location:  Left Arm)   Pulse 77   Temp 98 F (36.7 C) (Oral)   Resp 17   Ht 5\' 7"  (1.702 m)   Wt 65.7 kg   LMP 01/01/2004   SpO2 97%   BMI 22.69 kg/m   Physical Exam Vitals signs and nursing note reviewed.  Constitutional:      General: She is not in acute distress.    Appearance: Normal appearance. She is well-developed. She is not toxic-appearing.     Comments: Afebrile, nontoxic, NAD although occasionally screams out in pain. BP soft similar to prior visits  HENT:     Head: Normocephalic and atraumatic.  Eyes:     General:        Right eye: No discharge.        Left eye: No discharge.     Conjunctiva/sclera: Conjunctivae normal.  Neck:     Musculoskeletal: Normal range of motion and neck supple.  Cardiovascular:     Rate and Rhythm: Normal rate and regular rhythm.     Pulses: Normal pulses.     Heart sounds: Normal heart sounds, S1 normal and S2 normal. No murmur. No friction rub. No gallop.   Pulmonary:     Effort: Pulmonary effort is normal. No respiratory distress.     Breath sounds: Normal breath sounds. No decreased breath sounds, wheezing, rhonchi or rales.  Abdominal:     General: Bowel sounds are normal. There is no distension.     Palpations: Abdomen is soft. Abdomen is not rigid.     Tenderness: There is no abdominal tenderness. There is no right CVA tenderness, left CVA tenderness, guarding or rebound. Negative signs include Murphy's sign and McBurney's sign.  Musculoskeletal:     Lumbar back: She exhibits tenderness and spasm. She exhibits no bony tenderness.       Back:     Comments: Lumbar spine without spinous process TTP, no bony stepoffs or deformities, with diffuse L SI joint and buttock TTP tracking into the L thigh, L paraspinous muscle TTP and muscle spasms. +SLR on left side. No overlying skin changes. Able to move left leg on the exam table, but she screams in pain when she does; able to lift it up off the bed. Distal Strength and sensation grossly intact.  Distal pulses intact.   Gait steady after medications. Nonantalgic.   Skin:    General: Skin is warm and dry.     Findings: No rash.  Neurological:     Mental Status: She is alert and oriented to person, place, and time.     Sensory: Sensation is intact. No sensory deficit.     Motor: Motor function is intact.  Psychiatric:        Mood and Affect: Mood and affect normal.        Behavior: Behavior normal.      ED Treatments / Results  Labs (all labs ordered are listed, but only abnormal results are displayed) Labs Reviewed - No data to display  EKG None  Radiology No results found.  Procedures Procedures (including critical care time)  Medications Ordered in ED Medications  promethazine (PHENERGAN) injection 12.5 mg (12.5 mg Intravenous Given 01/14/19 2003)  dexamethasone (DECADRON) injection 10 mg (10 mg Intravenous Given 01/14/19 2002)  fentaNYL (SUBLIMAZE) injection 50 mcg (50 mcg Intravenous Given 01/14/19 2008)  methocarbamol (ROBAXIN) 1,000 mg in dextrose 5 % 50 mL IVPB (1,000 mg Intravenous New Bag/Given 01/14/19 2049)     Initial Impression / Assessment and Plan / ED Course  I have reviewed the triage vital signs and the nursing notes.  Pertinent labs & imaging results that were available during my care of the patient were reviewed by me and considered in my medical decision making (see chart for details).        43 y.o. female here with sciatica complaints, states this feels like her sciatica always does. Reports having L leg weakness like it's going to give out, which always happens when she has a flare up of her sciatica. Some tingling in lower leg/foot. Also reports n/v. She comes in requesting multiple medications including phenergan and dilaudid, says that's the "cocktail that works". On exam, BP soft in 90s/50s but similar to prior visits, left SI joint and buttock tenderness, with diffuse tenderness into the thigh, no focal bony or joint line tenderness, no  midline spinal tenderness, she is able to move her leg on the exam table slowly, distal strength grossly intact, NVI, +SLR. Suspect sciatica, has had multiple visits recently for this and I am suspicious that there is some drug seeking behaviors as well, since she has a list of meds that she desires. Given her softer BPs, will hold off on dilaudid, will try fentanyl instead (already received fentanyl and ketamine in route, too). Will also give phenergan, decadron, robaxin, and reassess. Doubt need for imaging at this time, but if still unable to walk then may consider need for further testing. Discussed case with my attending Dr. Tyrone Nine who agrees with plan.   9:37 PM Pt feeling better, stating that the medications "took the edge off". She says "if I can walk for you then can I go home?". She is able to get out of the bed unassisted and walk with a steady gait in the room without any assistance in doing so, and with a nonantalgic gait. I do not feel that we need to proceed with any emergent imaging at this time since there appears to be no weakness in the leg, and she's ambulatory without difficulty now, and she reports all of her symptoms are chronic. Will send home with phenergan, pt tolerating PO well here. Advised use of her usual home medications including robaxin and oxycodone, discussed use of tylenol/motrin for pain, heat use, and f/up with regular doctor in 1wk. Doubt need for continued steroids. Doubt need for further emergent work up or intervention at this time. Strict return precautions advised.   Again, I highly suspect there  is a drug seeking component to her visit today.   I explained the diagnosis and have given explicit precautions to return to the ER including for any other new or worsening symptoms. The patient understands and accepts the medical plan as it's been dictated and I have answered their questions. Discharge instructions concerning home care and prescriptions have been given. The  patient is STABLE and is discharged to home in good condition.    Final Clinical Impressions(s) / ED Diagnoses   Final diagnoses:  Sciatica of left side  Chronic left-sided low back pain with left-sided sciatica  Nausea and vomiting in adult patient    ED Discharge Orders         Ordered    promethazine (PHENERGAN) 25 MG tablet  Every 6 hours PRN     01/14/19 [address] [place], [place] 01/14/19 2137    Deno Etienne, DO 01/14/19 2139

## 2019-01-14 NOTE — Discharge Instructions (Addendum)
Take your usual home medications, including oxycodone and robaxin to help with your chronic back pain. Alternate between tylenol and ibuprofen as needed for additional pain relief. Use phenergan as directed, as needed for nausea. Stay well hydrated. Use heat to the areas of pain, no more than 20 minutes per hour. Follow up with your regular doctor in 1 week for recheck of symptoms. Return to the ER for emergent changes or worsening symptoms.

## 2019-01-14 NOTE — ED Notes (Signed)
Pt given graham crackers, peanut butter, and diet ginger ale per request.

## 2019-01-14 NOTE — ED Notes (Signed)
Bed: TV15 Expected date:  Expected time:  Means of arrival:  Comments: 43 yo sciatica, ketamine drip for pain control

## 2019-01-14 NOTE — ED Notes (Signed)
Pt tolerating fluids and crackers with no difficulty.

## 2019-01-14 NOTE — ED Notes (Signed)
Pt refused oral robaxin stating that she has to have IV robaxin or IV ativan because oral "just doesn't work for me anymore."  Pt states that this is all part of her "cocktail" that she gets when she comes here that she does not understand why they are not giving her dilaudid, pt stating her blood pressure is always low and asked if seeing another doctor means they will give her the dilaudid. This nurse explained that our PAs work closely with our doctors and there is one following her care. Pt agreed to try the fentanyl to see if it would work.   PA made aware.

## 2019-01-14 NOTE — ED Notes (Signed)
Pt requesting purewick stating she cannot get up or used a bedpan. Purewick in place.

## 2019-01-18 ENCOUNTER — Emergency Department (HOSPITAL_COMMUNITY)
Admission: EM | Admit: 2019-01-18 | Discharge: 2019-01-18 | Disposition: A | Discharge status: Home / Self Care | Payer: Medicare Other | Attending: Emergency Medicine | Admitting: Emergency Medicine

## 2019-01-18 ENCOUNTER — Encounter (HOSPITAL_COMMUNITY): Payer: Self-pay | Admitting: Emergency Medicine

## 2019-01-18 ENCOUNTER — Other Ambulatory Visit: Payer: Self-pay

## 2019-01-18 DIAGNOSIS — Z79899 Other long term (current) drug therapy: Secondary | ICD-10-CM | POA: Diagnosis not present

## 2019-01-18 DIAGNOSIS — F17210 Nicotine dependence, cigarettes, uncomplicated: Secondary | ICD-10-CM | POA: Insufficient documentation

## 2019-01-18 DIAGNOSIS — M5432 Sciatica, left side: Secondary | ICD-10-CM | POA: Insufficient documentation

## 2019-01-18 MED ORDER — METHOCARBAMOL 1000 MG/10ML IJ SOLN: 1000.0000 mg | Freq: Once | INTRAMUSCULAR | Status: DC

## 2019-01-18 MED ORDER — DEXAMETHASONE SODIUM PHOSPHATE 10 MG/ML IJ SOLN
10.0000 mg | Freq: Once | INTRAMUSCULAR | Status: AC
  Administered 2019-01-18: 10 mg via INTRAVENOUS
  Filled 2019-01-18: qty 1

## 2019-01-18 MED ORDER — METHOCARBAMOL 1000 MG/10ML IJ SOLN
1000.0000 mg | Freq: Once | INTRAMUSCULAR | Status: DC
  Filled 2019-01-18: qty 10

## 2019-01-18 MED ORDER — METHOCARBAMOL 1000 MG/10ML IJ SOLN
1000.0000 mg | Freq: Once | INTRAVENOUS | Status: AC
  Administered 2019-01-18: 1000 mg via INTRAVENOUS
  Filled 2019-01-18 (×2): qty 10

## 2019-01-18 MED ORDER — PROMETHAZINE HCL 25 MG/ML IJ SOLN
12.5000 mg | Freq: Once | INTRAMUSCULAR | Status: DC
  Filled 2019-01-18: qty 1

## 2019-01-18 MED ORDER — LORAZEPAM 2 MG/ML IJ SOLN
1.0000 mg | Freq: Once | INTRAMUSCULAR | Status: DC
  Filled 2019-01-18: qty 1

## 2019-01-18 MED ORDER — PROMETHAZINE HCL 25 MG/ML IJ SOLN
12.5000 mg | Freq: Once | INTRAMUSCULAR | Status: AC
  Administered 2019-01-18: 12.5 mg via INTRAVENOUS

## 2019-01-18 MED ORDER — KETAMINE HCL 50 MG/5ML IJ SOSY
0.3000 mg/kg | PREFILLED_SYRINGE | Freq: Once | INTRAMUSCULAR | Status: AC
  Administered 2019-01-18: 20 mg via INTRAVENOUS
  Filled 2019-01-18: qty 5

## 2019-01-18 NOTE — ED Notes (Signed)
ED Provider at bedside. 

## 2019-01-18 NOTE — ED Notes (Signed)
Bed: TV81 Expected date:  Expected time:  Means of arrival:  Comments: EMS sciatica pain, given fentanyl en route

## 2019-01-18 NOTE — ED Provider Notes (Signed)
[facility] DEPT Provider Note   CSN: [id number] Arrival date & time: 01/18/19  1452    History   Chief Complaint No chief complaint on file.   HPI Gina [name] is a 43 y.o. female.     43 year old female with history of sciatica who presents with her usual sciatic pain on the left side.  Denies any new trauma.  Pain characterizes sharp and worse with any movement.  No fever or chills.  No bowel or bladder dysfunction.  Patient had injection today for the symptoms and states normally takes about 3 days for her to really take effect.  Symptoms worse with any movement and better with remaining still.     Past Medical History:  Diagnosis Date  . ABDOMINAL WALL HERNIA 02/27/2010  . Abnormal Pap smear   . ALLERGIC RHINITIS 06/02/2007  . ANOREXIA, CHRONIC 08/07/2008  . ANXIETY 09/15/2010  . Arthritis   . BACK PAIN, THORACIC REGION 07/07/2007  . Bipolar disorder ([place])   . CERVICAL RADICULOPATHY 12/11/2008  . Condyloma acuminatum 04/23/2009  . CONSTIPATION 09/15/2010  . DYSPHAGIA UNSPECIFIED 09/09/2009  . 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 Eating disorder   . ENDOMETRIOSIS 12/16/2009  . FATIGUE 06/11/2010  . Fibromyalgia   . GERD 10/23/2009  . Heart palpitations   . HERPES SIMPLEX INFECTION 06/11/2010  . LENTIGO 04/23/2009  . LUNG NODULE 06/17/2010  . Narcotic abuse, continuous ([place])   . Ovarian cyst   . Palpitations 10/23/2009  . Panic disorder   . Rheumatoid arthritis(714.0)   . Stricture and stenosis of esophagus 05/13/2010  . TOBACCO ABUSE 08/07/2008  . Urinary tract infection   . UTI (urinary tract infection)     Patient Active Problem List   Diagnosis Date Noted  . Bipolar affective disorder, current episode mixed ([place]) 01/02/2019  . Arthritis 01/02/2019  . Cigarette nicotine dependence without complication 97/94/8016  . Bipolar 1 disorder, mixed, moderate ([facility]) 07/15/2015    Class: Chronic  . 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 Sciatica 01/09/2015  . Reactive hypoglycemia 12/17/2013  . Musculoskeletal malfunction arising from mental factors 04/04/2013  . Incisional hernia 11/15/2012  . Other postprocedural status(V45.89) 11/15/2012  . Abdominal pain 11/11/2012  . Chronic pain syndrome 11/11/2012  . Disorder of sacrum 11/11/2012  . Heartburn 10/04/2011  . DYSPHAGIA 10/04/2011  . Depression with anxiety 09/15/2010  . CONSTIPATION 09/15/2010  . LUNG NODULE 06/17/2010  . HERPES SIMPLEX INFECTION 06/11/2010  . FATIGUE 06/11/2010  . STRICTURE AND STENOSIS OF ESOPHAGUS 05/13/2010  . ABDOMINAL WALL HERNIA 02/27/2010  . ENDOMETRIOSIS 12/16/2009  . GERD 10/23/2009  . PALPITATIONS 10/23/2009  . 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 CONDYLOMA ACUMINATUM 04/23/2009  . LENTIGO 04/23/2009  . DENTAL PAIN 03/19/2009  . CERVICAL RADICULOPATHY 12/11/2008  . 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 TOBACCO ABUSE 08/07/2008  . BACK PAIN, THORACIC REGION 07/07/2007  . ALLERGIC RHINITIS 06/02/2007    Past Surgical History:  Procedure Laterality Date  . ABDOMINAL SURGERY    . ABDOMINAL WALL MESH  REMOVAL    . ABLATION ON ENDOMETRIOSIS    . CESAREAN SECTION     x2   . DILATION AND CURETTAGE OF UTERUS     x1   . ENDOMETRIAL ABLATION    . esophageal dilatation x 4    . GUM SURGERY    . HERNIA REPAIR     X3  . PARTIAL HYSTERECTOMY  OB History    Gravida  3   Para  2   Term  2   Preterm  0   AB  1   Living  2     SAB  1   TAB  0   Ectopic  0   Multiple  0   Live Births  1            Home Medications    Prior to Admission medications   Medication Sig Start Date End Date Taking? Authorizing Provider  clonazePAM (KLONOPIN) 0.5 MG tablet Take 0.5 mg by mouth 2 (two) times daily.  08/30/18  Yes [provider]  escitalopram (LEXAPRO) 20 MG tablet Take 30 mg by mouth at bedtime. 12/20/18  Yes [provider]  lurasidone (LATUDA) 80 MG TABS tablet Take 80 mg by mouth at bedtime.    Yes [provider]  oxyCODONE-acetaminophen (PERCOCET) 7.5-325 MG tablet Take 1 tablet by mouth 4 (four) times daily.   Yes [provider]  naloxegol oxalate (MOVANTIK) 25 MG TABS tablet Take 25 mg by mouth daily.    [provider]  promethazine (PHENERGAN) 25 MG tablet Take 1 tablet (25 mg total) by mouth every 6 (six) hours as needed for nausea or vomiting. 01/14/19   Street, [place], PA-C    Family History Family History  Problem Relation Age of Onset  . Depression Mother   . Arthritis Mother   . Hyperlipidemia Father   . Hypertension Father     Social History Social History   Tobacco Use  . Smoking status: Current Every Day Smoker    Packs/day: 1.00    Types: Cigarettes  . Smokeless tobacco: Never Used  Substance Use Topics  . Alcohol use: No  . Drug use: No     Allergies   Lamictal [lamotrigine]; Morphine and related; Nicotine; Zofran; Chantix [varenicline tartrate]; Codeine; Haldol [haloperidol]; Ibuprofen; Lidoderm [lidocaine]; Metaxalone; Sulfa antibiotics; Sulfasalazine; Toradol [ketorolac tromethamine]; Duloxetine; Gabapentin; Methadone; Prednisone; Pregabalin; Zolpidem; Amoxicillin; Aripiprazole; and Penicillins   Review of Systems Review of Systems  All other systems reviewed and are negative.    Physical Exam Updated Vital Signs BP 112/68   Pulse 81   Temp 98.6 F (37 C)   Resp 15   Ht 1.702 m (5\' 7" )   Wt 65.8 kg   LMP 01/01/2004   SpO2 96%   BMI 22.71 kg/m   Physical Exam Vitals signs and nursing note reviewed.  Constitutional:      General: She is not in acute distress.    Appearance: Normal appearance. She is well-developed. She is not toxic-appearing.  HENT:     Head: Normocephalic and atraumatic.  Eyes:     General: Lids are normal.     Conjunctiva/sclera: Conjunctivae normal.     Pupils: Pupils are equal, round, and reactive to  light.  Neck:     Musculoskeletal: Normal range of motion and neck supple.     Thyroid: No thyroid mass.     Trachea: No tracheal deviation.  Cardiovascular:     Rate and Rhythm: Normal rate and regular rhythm.     Heart sounds: Normal heart sounds. No murmur. No gallop.   Pulmonary:     Effort: Pulmonary effort is normal. No respiratory distress.     Breath sounds: Normal breath sounds. No stridor. No decreased breath sounds, wheezing, rhonchi or rales.  Abdominal:     General: Bowel sounds are normal. There is no distension.  Palpations: Abdomen is soft.     Tenderness: There is no abdominal tenderness. There is no rebound.  Musculoskeletal: Normal range of motion.        General: No tenderness.       Legs:  Skin:    General: Skin is warm and dry.     Findings: No abrasion or rash.  Neurological:     Mental Status: She is alert and oriented to person, place, and time.     GCS: GCS eye subscore is 4. GCS verbal subscore is 5. GCS motor subscore is 6.     Cranial Nerves: No cranial nerve deficit.     Sensory: No sensory deficit.     Motor: No weakness.  Psychiatric:        Mood and Affect: Mood is anxious.        Speech: Speech is rapid and pressured.      ED Treatments / Results  Labs (all labs ordered are listed, but only abnormal results are displayed) Labs Reviewed - No data to display  EKG None  Radiology No results found.  Procedures Procedures (including critical care time)  Medications Ordered in ED Medications  dexamethasone (DECADRON) injection 10 mg (has no administration in time range)  ketamine 50 mg in normal saline 5 mL (10 mg/mL) syringe (has no administration in time range)  methocarbamol (ROBAXIN) injection 1,000 mg (has no administration in time range)  promethazine (PHENERGAN) injection 12.5 mg (has no administration in time range)     Initial Impression / Assessment and Plan / ED Course  I have reviewed the triage vital signs and the  nursing notes.  Pertinent labs & imaging results that were available during my care of the patient were reviewed by me and considered in my medical decision making (see chart for details).        Patient treat with medications here for her sciatica and feels better.  She has no evidence of cauda equina.  Return precautions given Final Clinical Impressions(s) / ED Diagnoses   Final diagnoses:  None    ED Discharge Orders    None       Lacretia Leigh, MD 01/18/19 1843

## 2019-01-18 NOTE — ED Triage Notes (Signed)
Per EMS, patient from home, c/o fall r/t left lower back pain down left leg. Hx sciatica. Reports she had epidural injection this morning without relief.   BP 94/62 HR 102 RR 30 O2 94% 66mcg fentanyl with EMS

## 2019-01-18 NOTE — ED Notes (Signed)
Pt requested to stop Robaxin drip

## 2019-02-02 ENCOUNTER — Encounter (HOSPITAL_COMMUNITY): Payer: Self-pay

## 2019-02-02 ENCOUNTER — Emergency Department (HOSPITAL_COMMUNITY)
Admission: EM | Admit: 2019-02-02 | Discharge: 2019-02-02 | Disposition: A | Discharge status: Home / Self Care | Payer: Medicare Other | Attending: Emergency Medicine | Admitting: Emergency Medicine

## 2019-02-02 ENCOUNTER — Other Ambulatory Visit: Payer: Self-pay

## 2019-02-02 DIAGNOSIS — Z79899 Other long term (current) drug therapy: Secondary | ICD-10-CM | POA: Insufficient documentation

## 2019-02-02 DIAGNOSIS — F17210 Nicotine dependence, cigarettes, uncomplicated: Secondary | ICD-10-CM | POA: Diagnosis not present

## 2019-02-02 DIAGNOSIS — M5430 Sciatica, unspecified side: Secondary | ICD-10-CM | POA: Diagnosis present

## 2019-02-02 DIAGNOSIS — M5432 Sciatica, left side: Secondary | ICD-10-CM | POA: Insufficient documentation

## 2019-02-02 MED ORDER — LORAZEPAM 2 MG/ML IJ SOLN
1.0000 mg | Freq: Once | INTRAMUSCULAR | Status: AC
  Administered 2019-02-02: 1 mg via INTRAVENOUS
  Filled 2019-02-02: qty 1

## 2019-02-02 MED ORDER — PROMETHAZINE HCL 25 MG/ML IJ SOLN
25.0000 mg | Freq: Once | INTRAMUSCULAR | Status: AC
  Administered 2019-02-02: 25 mg via INTRAVENOUS
  Filled 2019-02-02: qty 1

## 2019-02-02 MED ORDER — DEXAMETHASONE SODIUM PHOSPHATE 10 MG/ML IJ SOLN
10.0000 mg | Freq: Once | INTRAMUSCULAR | Status: AC
  Administered 2019-02-02: 10 mg via INTRAVENOUS
  Filled 2019-02-02: qty 1

## 2019-02-02 MED ORDER — METHOCARBAMOL 1000 MG/10ML IJ SOLN
1000.0000 mg | Freq: Once | INTRAVENOUS | Status: AC
  Administered 2019-02-02: 1000 mg via INTRAVENOUS
  Filled 2019-02-02: qty 10

## 2019-02-02 MED ORDER — HYDROMORPHONE HCL 1 MG/ML IJ SOLN
1.0000 mg | Freq: Once | INTRAMUSCULAR | Status: AC
  Administered 2019-02-02: 1 mg via INTRAVENOUS
  Filled 2019-02-02: qty 1

## 2019-02-02 MED ORDER — METHOCARBAMOL 1000 MG/10ML IJ SOLN: 1000.0000 mg | Freq: Once | INTRAMUSCULAR | Status: DC

## 2019-02-02 NOTE — ED Provider Notes (Signed)
[facility] DEPT Provider Note   CSN: [id number] Arrival date & time: 02/02/19  1819    History   Chief Complaint Chief Complaint  Patient presents with  . Sciatica    HPI Gina [name] is a 43 y.o. female.     Pt presents to the ED today with sciatica pain.  She has this issue chronically.  She received an epidural injection about 2 weeks ago.  She said that normally takes care of the pain, but she thinks she over did it by cleaning out the kitchen cabinets.  Severe pain started yesterday and continues today.  EMS gave her ketamine and fentanyl which did not help.  Dilaudid, decadron, and phenergan usually do.  He left leg gave out today and she fell, but does not think she hurt her back with this fall.     Past Medical History:  Diagnosis Date  . ABDOMINAL WALL HERNIA 02/27/2010  . Abnormal Pap smear   . ALLERGIC RHINITIS 06/02/2007  . ANOREXIA, CHRONIC 08/07/2008  . ANXIETY 09/15/2010  . Arthritis   . BACK PAIN, THORACIC REGION 07/07/2007  . Bipolar disorder ([place])   . CERVICAL RADICULOPATHY 12/11/2008  . Condyloma acuminatum 04/23/2009  . CONSTIPATION 09/15/2010  . DYSPHAGIA UNSPECIFIED 09/09/2009  . 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 Eating disorder   . ENDOMETRIOSIS 12/16/2009  . FATIGUE 06/11/2010  . Fibromyalgia   . GERD 10/23/2009  . Heart palpitations   . HERPES SIMPLEX INFECTION 06/11/2010  . LENTIGO 04/23/2009  . LUNG NODULE 06/17/2010  . Narcotic abuse, continuous ([place])   . Ovarian cyst   . Palpitations 10/23/2009  . Panic disorder   . Rheumatoid arthritis(714.0)   . Stricture and stenosis of esophagus 05/13/2010  . TOBACCO ABUSE 08/07/2008  . Urinary tract infection   . UTI (urinary tract infection)     Patient Active Problem List   Diagnosis Date Noted  . Bipolar affective disorder, current episode mixed ([place]) 01/02/2019  . Arthritis 01/02/2019  . Cigarette nicotine dependence without complication 63/89/3734  . Bipolar 1  disorder, mixed, moderate ([facility]) 07/15/2015    Class: Chronic  . 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 Sciatica 01/09/2015  . Reactive hypoglycemia 12/17/2013  . Musculoskeletal malfunction arising from mental factors 04/04/2013  . Incisional hernia 11/15/2012  . Other postprocedural status(V45.89) 11/15/2012  . Abdominal pain 11/11/2012  . Chronic pain syndrome 11/11/2012  . Disorder of sacrum 11/11/2012  . Heartburn 10/04/2011  . DYSPHAGIA 10/04/2011  . Depression with anxiety 09/15/2010  . CONSTIPATION 09/15/2010  . LUNG NODULE 06/17/2010  . HERPES SIMPLEX INFECTION 06/11/2010  . FATIGUE 06/11/2010  . STRICTURE AND STENOSIS OF ESOPHAGUS 05/13/2010  . ABDOMINAL WALL HERNIA 02/27/2010  . ENDOMETRIOSIS 12/16/2009  . GERD 10/23/2009  . PALPITATIONS 10/23/2009  . 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 CONDYLOMA ACUMINATUM 04/23/2009  . LENTIGO 04/23/2009  . DENTAL PAIN 03/19/2009  . CERVICAL RADICULOPATHY 12/11/2008  . 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 TOBACCO ABUSE 08/07/2008  . BACK PAIN, THORACIC REGION 07/07/2007  . ALLERGIC RHINITIS 06/02/2007    Past Surgical History:  Procedure Laterality Date  . ABDOMINAL SURGERY    . ABDOMINAL WALL MESH  REMOVAL    . ABLATION ON ENDOMETRIOSIS    . CESAREAN SECTION     x2   . DILATION AND CURETTAGE OF UTERUS     x1   . ENDOMETRIAL ABLATION    . esophageal dilatation x  4    . GUM SURGERY    . HERNIA REPAIR     X3  . PARTIAL HYSTERECTOMY       OB History    Gravida  3   Para  2   Term  2   Preterm  0   AB  1   Living  2     SAB  1   TAB  0   Ectopic  0   Multiple  0   Live Births  1            Home Medications    Prior to Admission medications   Medication Sig Start Date End Date Taking? Authorizing Provider  clonazePAM (KLONOPIN) 0.5 MG tablet Take 0.5 mg by mouth 2 (two) times daily.  08/30/18   [provider]   escitalopram (LEXAPRO) 20 MG tablet Take 30 mg by mouth at bedtime. 12/20/18   [provider]  lurasidone (LATUDA) 80 MG TABS tablet Take 80 mg by mouth at bedtime.     [provider]  naloxegol oxalate (MOVANTIK) 25 MG TABS tablet Take 25 mg by mouth daily.    [provider]  oxyCODONE-acetaminophen (PERCOCET) 7.5-325 MG tablet Take 1 tablet by mouth 4 (four) times daily.    [provider]  promethazine (PHENERGAN) 25 MG tablet Take 1 tablet (25 mg total) by mouth every 6 (six) hours as needed for nausea or vomiting. 01/14/19   Street, [place], PA-C    Family History Family History  Problem Relation Age of Onset  . Depression Mother   . Arthritis Mother   . Hyperlipidemia Father   . Hypertension Father     Social History Social History   Tobacco Use  . Smoking status: Current Every Day Smoker    Packs/day: 1.00    Types: Cigarettes  . Smokeless tobacco: Never Used  Substance Use Topics  . Alcohol use: No  . Drug use: No     Allergies   Lamictal [lamotrigine]; Morphine and related; Nicotine; Zofran; Chantix [varenicline tartrate]; Codeine; Haldol [haloperidol]; Ibuprofen; Lidoderm [lidocaine]; Metaxalone; Sulfa antibiotics; Sulfasalazine; Toradol [ketorolac tromethamine]; Duloxetine; Gabapentin; Methadone; Prednisone; Pregabalin; Zolpidem; Amoxicillin; Aripiprazole; and Penicillins   Review of Systems Review of Systems  Neurological: Positive for numbness.       Pain going down left leg  All other systems reviewed and are negative.    Physical Exam Updated Vital Signs BP 112/72 (BP Location: Left Arm)   Pulse 90   Temp 98.6 F (37 C) (Oral)   Resp 18   Ht 5\' 7"  (1.702 m)   Wt 63.5 kg   LMP 01/01/2004   SpO2 100%   BMI 21.93 kg/m   Physical Exam Vitals signs and nursing note reviewed.  Constitutional:      Appearance: Normal appearance.  HENT:     Head: Normocephalic and atraumatic.     Right Ear: External ear  normal.     Left Ear: External ear normal.     Nose: Nose normal.     Mouth/Throat:     Mouth: Mucous membranes are moist.  Eyes:     Extraocular Movements: Extraocular movements intact.     Pupils: Pupils are equal, round, and reactive to light.  Neck:     Musculoskeletal: Normal range of motion and neck supple.  Cardiovascular:     Rate and Rhythm: Normal rate and regular rhythm.     Pulses: Normal pulses.     Heart sounds: Normal heart  sounds.  Pulmonary:     Effort: Pulmonary effort is normal.     Breath sounds: Normal breath sounds.  Abdominal:     General: Abdomen is flat.  Musculoskeletal: Normal range of motion.  Skin:    General: Skin is warm.     Capillary Refill: Capillary refill takes less than 2 seconds.  Neurological:     General: No focal deficit present.     Mental Status: She is alert and oriented to person, place, and time.     Comments: + str leg raise on left  Psychiatric:        Mood and Affect: Mood normal.        Behavior: Behavior normal.      ED Treatments / Results  Labs (all labs ordered are listed, but only abnormal results are displayed) Labs Reviewed - No data to display  EKG None  Radiology No results found.  Procedures Procedures (including critical care time)  Medications Ordered in ED Medications  HYDROmorphone (DILAUDID) injection 1 mg (1 mg Intravenous Given 02/02/19 1901)  dexamethasone (DECADRON) injection 10 mg (10 mg Intravenous Given 02/02/19 1857)  methocarbamol (ROBAXIN) 1,000 mg in dextrose 5 % 50 mL IVPB (0 mg Intravenous Stopped 02/02/19 2018)  promethazine (PHENERGAN) injection 25 mg (25 mg Intravenous Given 02/02/19 1906)  HYDROmorphone (DILAUDID) injection 1 mg (1 mg Intravenous Given 02/02/19 2018)  LORazepam (ATIVAN) injection 1 mg (1 mg Intravenous Given 02/02/19 2018)     Initial Impression / Assessment and Plan / ED Course  I have reviewed the triage vital signs and the nursing notes.  Pertinent labs & imaging  results that were available during my care of the patient were reviewed by me and considered in my medical decision making (see chart for details).      Pt is feeling much better after the above treatment.  She knows to return if worse.  Final Clinical Impressions(s) / ED Diagnoses   Final diagnoses:  Left sided sciatica    ED Discharge Orders    None       Isla Pence, MD 02/02/19 2035

## 2019-02-02 NOTE — ED Notes (Signed)
PT DISCHARGED. INSTRUCTIONS GIVEN. AAOX4. PT IN NO APPARENT DISTRESS WITH MILD PAIN. THE OPPORTUNITY TO ASK QUESTIONS WAS PROVIDED. 

## 2019-02-02 NOTE — ED Notes (Signed)
Bed: WA20 Expected date:  Expected time:  Means of arrival:  Comments: EMS sciatica

## 2019-02-02 NOTE — ED Triage Notes (Signed)
Per EMS: Pt from home.  Pt hx of sciatia.  Pt has had pain x2 weeks, pt had an injection 2 weeks ago.  Pt fell today, which exacerbated pain.  Pt has had 100 mcg fentanyl (18:00, and 18:05), and 13 mg of ketamine (given at 18:13).

## 2019-02-05 DIAGNOSIS — M47816 Spondylosis without myelopathy or radiculopathy, lumbar region: Secondary | ICD-10-CM | POA: Insufficient documentation

## 2019-02-11 ENCOUNTER — Emergency Department (HOSPITAL_COMMUNITY)
Admission: EM | Admit: 2019-02-11 | Discharge: 2019-02-11 | Disposition: A | Discharge status: Home / Self Care | Payer: Medicare Other | Attending: Emergency Medicine | Admitting: Emergency Medicine

## 2019-02-11 ENCOUNTER — Other Ambulatory Visit: Payer: Self-pay

## 2019-02-11 DIAGNOSIS — F17210 Nicotine dependence, cigarettes, uncomplicated: Secondary | ICD-10-CM | POA: Diagnosis not present

## 2019-02-11 DIAGNOSIS — Z79899 Other long term (current) drug therapy: Secondary | ICD-10-CM | POA: Insufficient documentation

## 2019-02-11 DIAGNOSIS — L509 Urticaria, unspecified: Secondary | ICD-10-CM | POA: Diagnosis present

## 2019-02-11 MED ORDER — DIPHENHYDRAMINE HCL 25 MG PO TABS: 50.0000 mg | ORAL_TABLET | Freq: Four times a day (QID) | ORAL | 0 refills | Status: DC | PRN

## 2019-02-11 MED ORDER — DIPHENHYDRAMINE HCL 50 MG/ML IJ SOLN
50.0000 mg | Freq: Once | INTRAMUSCULAR | Status: AC
  Administered 2019-02-11: 50 mg via INTRAVENOUS
  Filled 2019-02-11: qty 1

## 2019-02-11 MED ORDER — PREDNISONE 20 MG PO TABS: 40.0000 mg | ORAL_TABLET | Freq: Every day | ORAL | 0 refills | Status: AC

## 2019-02-11 MED ORDER — FAMOTIDINE 20 MG PO TABS: 20.0000 mg | ORAL_TABLET | Freq: Two times a day (BID) | ORAL | 0 refills | Status: DC

## 2019-02-11 MED ORDER — PREDNISONE 20 MG PO TABS: 40.0000 mg | ORAL_TABLET | Freq: Every day | ORAL | 0 refills | Status: DC

## 2019-02-11 MED ORDER — METHYLPREDNISOLONE SODIUM SUCC 125 MG IJ SOLR
125.0000 mg | Freq: Once | INTRAMUSCULAR | Status: AC
  Administered 2019-02-11: 125 mg via INTRAVENOUS
  Filled 2019-02-11: qty 2

## 2019-02-11 MED ORDER — EPINEPHRINE 0.3 MG/0.3ML IJ SOAJ
0.3000 mg | Freq: Once | INTRAMUSCULAR | Status: AC
  Administered 2019-02-11: 0.3 mg via INTRAMUSCULAR
  Filled 2019-02-11: qty 0.3

## 2019-02-11 MED ORDER — FAMOTIDINE IN NACL 20-0.9 MG/50ML-% IV SOLN
20.0000 mg | Freq: Once | INTRAVENOUS | Status: AC
  Administered 2019-02-11: 20 mg via INTRAVENOUS
  Filled 2019-02-11: qty 50

## 2019-02-11 NOTE — ED Triage Notes (Signed)
Patient reports she recently started taking medication to treat her Tardive Dyskinesia. Pt reports they told her to start 80mg  and now she Is covered all over her trunk, neck, arms, and face with hives. Pt reports she tried at home topical lidocaine, calamine lotion, and oral benadryl with no relief

## 2019-02-11 NOTE — ED Provider Notes (Signed)
[facility] DEPT Provider Note   CSN: [id number] Arrival date & time: 02/11/19  2119    History   Chief Complaint Chief Complaint  Patient presents with  . Urticaria    HPI Gina [name] is a 43 y.o. female.     HPI Patient is a 43 year old female with a history of multiple allergies who presents to the emergency department with diffuse urticaria for the majority of the day.  She tried Benadryl at home without improvement in her symptoms.  She recently was started on Ingrezza 80 mg.  She believes this is the possible cause.  She denies difficulty breathing or swallowing.  No fevers or chills.  She reports diffuse urticaria throughout her arms chest and abdomen as well as back.  She reports severe itching.   Past Medical History:  Diagnosis Date  . ABDOMINAL WALL HERNIA 02/27/2010  . Abnormal Pap smear   . ALLERGIC RHINITIS 06/02/2007  . ANOREXIA, CHRONIC 08/07/2008  . ANXIETY 09/15/2010  . Arthritis   . BACK PAIN, THORACIC REGION 07/07/2007  . Bipolar disorder ([place])   . CERVICAL RADICULOPATHY 12/11/2008  . Condyloma acuminatum 04/23/2009  . CONSTIPATION 09/15/2010  . DYSPHAGIA UNSPECIFIED 09/09/2009  . 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 Eating disorder   . ENDOMETRIOSIS 12/16/2009  . FATIGUE 06/11/2010  . Fibromyalgia   . GERD 10/23/2009  . Heart palpitations   . HERPES SIMPLEX INFECTION 06/11/2010  . LENTIGO 04/23/2009  . LUNG NODULE 06/17/2010  . Narcotic abuse, continuous ([place])   . Ovarian cyst   . Palpitations 10/23/2009  . Panic disorder   . Rheumatoid arthritis(714.0)   . Stricture and stenosis of esophagus 05/13/2010  . TOBACCO ABUSE 08/07/2008  . Urinary tract infection   . UTI (urinary tract infection)     Patient Active Problem List   Diagnosis Date Noted  . Bipolar affective disorder, current episode mixed ([place]) 01/02/2019  . Arthritis 01/02/2019  . Cigarette nicotine dependence without complication 41/74/0814  . Bipolar 1  disorder, mixed, moderate ([facility]) 07/15/2015    Class: Chronic  . 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 Sciatica 01/09/2015  . Reactive hypoglycemia 12/17/2013  . Musculoskeletal malfunction arising from mental factors 04/04/2013  . Incisional hernia 11/15/2012  . Other postprocedural status(V45.89) 11/15/2012  . Abdominal pain 11/11/2012  . Chronic pain syndrome 11/11/2012  . Disorder of sacrum 11/11/2012  . Heartburn 10/04/2011  . DYSPHAGIA 10/04/2011  . Depression with anxiety 09/15/2010  . CONSTIPATION 09/15/2010  . LUNG NODULE 06/17/2010  . HERPES SIMPLEX INFECTION 06/11/2010  . FATIGUE 06/11/2010  . STRICTURE AND STENOSIS OF ESOPHAGUS 05/13/2010  . ABDOMINAL WALL HERNIA 02/27/2010  . ENDOMETRIOSIS 12/16/2009  . GERD 10/23/2009  . PALPITATIONS 10/23/2009  . 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 CONDYLOMA ACUMINATUM 04/23/2009  . LENTIGO 04/23/2009  . DENTAL PAIN 03/19/2009  . CERVICAL RADICULOPATHY 12/11/2008  . 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 TOBACCO ABUSE 08/07/2008  . BACK PAIN, THORACIC REGION 07/07/2007  . ALLERGIC RHINITIS 06/02/2007    Past Surgical History:  Procedure Laterality Date  . ABDOMINAL SURGERY    . ABDOMINAL WALL MESH  REMOVAL    . ABLATION ON ENDOMETRIOSIS    . CESAREAN SECTION     x2   . DILATION AND CURETTAGE OF UTERUS     x1   . ENDOMETRIAL ABLATION    . esophageal dilatation x 4    . GUM SURGERY    .  HERNIA REPAIR     X3  . PARTIAL HYSTERECTOMY       OB History    Gravida  3   Para  2   Term  2   Preterm  0   AB  1   Living  2     SAB  1   TAB  0   Ectopic  0   Multiple  0   Live Births  1            Home Medications    Prior to Admission medications   Medication Sig Start Date End Date Taking? Authorizing Provider  clonazePAM (KLONOPIN) 0.5 MG tablet Take 0.5 mg by mouth 2 (two) times daily.  08/30/18   [provider]   diphenhydrAMINE (BENADRYL) 25 MG tablet Take 2 tablets (50 mg total) by mouth every 6 (six) hours as needed for allergies. 02/11/19   Jola Schmidt, MD  escitalopram (LEXAPRO) 20 MG tablet Take 30 mg by mouth at bedtime. 12/20/18   [provider]  famotidine (PEPCID) 20 MG tablet Take 1 tablet (20 mg total) by mouth 2 (two) times daily. 02/11/19   Jola Schmidt, MD  lurasidone (LATUDA) 80 MG TABS tablet Take 80 mg by mouth at bedtime.     [provider]  naloxegol oxalate (MOVANTIK) 25 MG TABS tablet Take 25 mg by mouth daily.    [provider]  oxyCODONE-acetaminophen (PERCOCET) 7.5-325 MG tablet Take 1 tablet by mouth 4 (four) times daily.    [provider]  predniSONE (DELTASONE) 20 MG tablet Take 2 tablets (40 mg total) by mouth daily for 5 days. 02/11/19 02/16/19  Jola Schmidt, MD  promethazine (PHENERGAN) 25 MG tablet Take 1 tablet (25 mg total) by mouth every 6 (six) hours as needed for nausea or vomiting. 01/14/19   Street, [place], PA-C    Family History Family History  Problem Relation Age of Onset  . Depression Mother   . Arthritis Mother   . Hyperlipidemia Father   . Hypertension Father     Social History Social History   Tobacco Use  . Smoking status: Current Every Day Smoker    Packs/day: 1.00    Types: Cigarettes  . Smokeless tobacco: Never Used  Substance Use Topics  . Alcohol use: No  . Drug use: No     Allergies   Lamictal [lamotrigine]; Morphine and related; Nicotine; Zofran; Chantix [varenicline tartrate]; Codeine; Haldol [haloperidol]; Ibuprofen; Lidoderm [lidocaine]; Metaxalone; Sulfa antibiotics; Sulfasalazine; Toradol [ketorolac tromethamine]; Duloxetine; Gabapentin; Methadone; Pregabalin; Zolpidem; Amoxicillin; Aripiprazole; and Penicillins   Review of Systems Review of Systems  All other systems reviewed and are negative.    Physical Exam Updated Vital Signs BP 100/79 (BP Location: Left Arm)   Pulse (!) 107    Temp 98.7 F (37.1 C) (Oral)   Resp 18   LMP 01/01/2004   SpO2 97%   Physical Exam Vitals signs and nursing note reviewed.  Constitutional:      General: She is not in acute distress.    Appearance: She is well-developed.  HENT:     Head: Normocephalic and atraumatic.  Neck:     Musculoskeletal: Normal range of motion.  Cardiovascular:     Rate and Rhythm: Normal rate and regular rhythm.     Heart sounds: Normal heart sounds.  Pulmonary:     Effort: Pulmonary effort is normal.     Breath sounds: Normal breath sounds.  Abdominal:     General: There is  no distension.     Palpations: Abdomen is soft.     Tenderness: There is no abdominal tenderness.  Musculoskeletal: Normal range of motion.  Skin:    General: Skin is warm and dry.     Comments: Diffuse urticaria throughout her chest abdomen back and upper extremities.  Neurological:     Mental Status: She is alert and oriented to person, place, and time.  Psychiatric:        Judgment: Judgment normal.      ED Treatments / Results  Labs (all labs ordered are listed, but only abnormal results are displayed) Labs Reviewed - No data to display  EKG None  Radiology No results found.  Procedures Procedures (including critical care time)  Medications Ordered in ED Medications  famotidine (PEPCID) IVPB 20 mg premix (20 mg Intravenous New Bag/Given 02/11/19 2211)  diphenhydrAMINE (BENADRYL) injection 50 mg (50 mg Intravenous Given 02/11/19 2213)  methylPREDNISolone sodium succinate (SOLU-MEDROL) 125 mg/2 mL injection 125 mg (125 mg Intravenous Given 02/11/19 2213)  EPINEPHrine (EPI-PEN) injection 0.3 mg (0.3 mg Intramuscular Given 02/11/19 2153)     Initial Impression / Assessment and Plan / ED Course  I have reviewed the triage vital signs and the nursing notes.  Pertinent labs & imaging results that were available during my care of the patient were reviewed by me and considered in my medical decision making (see  chart for details).        Urticaria treated with prednisone, Benadryl, Pepcid.  A dose of IM epinephrine was given given the severity of her urticaria and for comfort not for anaphylaxis.  She is overall well-appearing.  She will need to discontinue her Ingrezza  10:49 PM Significant improvement in the patient's urticaria.  She is still having some pruritus.  She is much better than arrival.  She will be discharged home with standard medications including prednisone, Benadryl, Pepcid.  She will discontinue her Ingrezza and speak with her mental health team tomorrow for additional recommendations.  Final Clinical Impressions(s) / ED Diagnoses   Final diagnoses:  Urticaria    ED Discharge Orders         Ordered    diphenhydrAMINE (BENADRYL) 25 MG tablet  Every 6 hours PRN,   Status:  Discontinued     02/11/19 2237    famotidine (PEPCID) 20 MG tablet  2 times daily,   Status:  Discontinued     02/11/19 2237    predniSONE (DELTASONE) 20 MG tablet  Daily,   Status:  Discontinued     02/11/19 2237    diphenhydrAMINE (BENADRYL) 25 MG tablet  Every 6 hours PRN     02/11/19 2246    famotidine (PEPCID) 20 MG tablet  2 times daily     02/11/19 2246    predniSONE (DELTASONE) 20 MG tablet  Daily     02/11/19 2246           Jola Schmidt, MD 02/11/19 2249

## 2019-02-11 NOTE — ED Notes (Signed)
Ingrezza 80 mg taken that caused allergic reaction.

## 2019-02-13 ENCOUNTER — Emergency Department (HOSPITAL_COMMUNITY)
Admission: EM | Admit: 2019-02-13 | Discharge: 2019-02-13 | Disposition: A | Discharge status: Home / Self Care | Payer: Medicare Other | Attending: Emergency Medicine | Admitting: Emergency Medicine

## 2019-02-13 ENCOUNTER — Ambulatory Visit: Payer: Medicare Other | Admitting: Family Medicine

## 2019-02-13 ENCOUNTER — Encounter (HOSPITAL_COMMUNITY): Payer: Self-pay | Admitting: *Deleted

## 2019-02-13 DIAGNOSIS — M5432 Sciatica, left side: Secondary | ICD-10-CM | POA: Diagnosis not present

## 2019-02-13 DIAGNOSIS — M79605 Pain in left leg: Secondary | ICD-10-CM | POA: Insufficient documentation

## 2019-02-13 DIAGNOSIS — Z880 Allergy status to penicillin: Secondary | ICD-10-CM | POA: Insufficient documentation

## 2019-02-13 DIAGNOSIS — F17210 Nicotine dependence, cigarettes, uncomplicated: Secondary | ICD-10-CM | POA: Insufficient documentation

## 2019-02-13 DIAGNOSIS — R21 Rash and other nonspecific skin eruption: Secondary | ICD-10-CM | POA: Diagnosis present

## 2019-02-13 DIAGNOSIS — Z79899 Other long term (current) drug therapy: Secondary | ICD-10-CM | POA: Insufficient documentation

## 2019-02-13 LAB — CBC WITH DIFFERENTIAL/PLATELET
Abs Immature Granulocytes: 0.05 10*3/uL (ref 0.00–0.07)
Basophils Absolute: 0 10*3/uL (ref 0.0–0.1)
Basophils Relative: 0 %
Eosinophils Absolute: 0 10*3/uL (ref 0.0–0.5)
Eosinophils Relative: 0 %
HCT: 43.1 % (ref 36.0–46.0)
Hemoglobin: 14.5 g/dL (ref 12.0–15.0)
Immature Granulocytes: 0 %
Lymphocytes Relative: 3 %
Lymphs Abs: 0.5 10*3/uL — ABNORMAL LOW (ref 0.7–4.0)
MCH: 34 pg (ref 26.0–34.0)
MCHC: 33.6 g/dL (ref 30.0–36.0)
MCV: 101.2 fL — ABNORMAL HIGH (ref 80.0–100.0)
Monocytes Absolute: 0.7 10*3/uL (ref 0.1–1.0)
Monocytes Relative: 4 %
Neutro Abs: 14.2 10*3/uL — ABNORMAL HIGH (ref 1.7–7.7)
Neutrophils Relative %: 93 %
Platelets: 210 10*3/uL (ref 150–400)
RBC: 4.26 MIL/uL (ref 3.87–5.11)
RDW: 13.1 % (ref 11.5–15.5)
WBC: 15.5 10*3/uL — ABNORMAL HIGH (ref 4.0–10.5)
nRBC: 0 % (ref 0.0–0.2)

## 2019-02-13 LAB — C-REACTIVE PROTEIN: CRP: 1.5 mg/dL — ABNORMAL HIGH (ref <1.0)

## 2019-02-13 LAB — COMPREHENSIVE METABOLIC PANEL
ALT: 19 U/L (ref 0–44)
AST: 16 U/L (ref 15–41)
Albumin: 3.9 g/dL (ref 3.5–5.0)
Alkaline Phosphatase: 79 U/L (ref 38–126)
Anion gap: 9 (ref 5–15)
BUN: 15 mg/dL (ref 6–20)
CO2: 24 mmol/L (ref 22–32)
Calcium: 8.7 mg/dL — ABNORMAL LOW (ref 8.9–10.3)
Chloride: 106 mmol/L (ref 98–111)
Creatinine, Ser: 0.5 mg/dL (ref 0.44–1.00)
GFR calc Af Amer: >60 mL/min (ref >60)
GFR calc non Af Amer: >60 mL/min (ref >60)
Glucose, Bld: 107 mg/dL — ABNORMAL HIGH (ref 70–99)
Potassium: 3.8 mmol/L (ref 3.5–5.1)
Sodium: 139 mmol/L (ref 135–145)
Total Bilirubin: 0.4 mg/dL (ref 0.3–1.2)
Total Protein: 6.6 g/dL (ref 6.5–8.1)

## 2019-02-13 LAB — SEDIMENTATION RATE: Sed Rate: 3 mm/hr (ref 0–22)

## 2019-02-13 MED ORDER — DEXAMETHASONE SODIUM PHOSPHATE 10 MG/ML IJ SOLN
10.0000 mg | Freq: Once | INTRAMUSCULAR | Status: AC
  Administered 2019-02-13: 10 mg via INTRAVENOUS
  Filled 2019-02-13: qty 1

## 2019-02-13 MED ORDER — DIPHENHYDRAMINE HCL 25 MG PO CAPS
25.0000 mg | ORAL_CAPSULE | Freq: Once | ORAL | Status: DC
  Filled 2019-02-13: qty 1

## 2019-02-13 MED ORDER — EPINEPHRINE 0.3 MG/0.3ML IJ SOAJ
0.3000 mg | Freq: Once | INTRAMUSCULAR | Status: AC
  Administered 2019-02-13: 0.3 mg via INTRAMUSCULAR
  Filled 2019-02-13: qty 0.3

## 2019-02-13 MED ORDER — SODIUM CHLORIDE 0.9 % IV SOLN
10.0000 mg | Freq: Once | INTRAVENOUS | Status: AC
  Administered 2019-02-13: 10 mg via INTRAVENOUS
  Filled 2019-02-13: qty 1

## 2019-02-13 MED ORDER — LORAZEPAM 1 MG PO TABS
2.0000 mg | ORAL_TABLET | Freq: Once | ORAL | Status: DC
  Filled 2019-02-13: qty 2

## 2019-02-13 MED ORDER — HYDROXYZINE HCL 25 MG PO TABS: 25.0000 mg | ORAL_TABLET | Freq: Four times a day (QID) | ORAL | 0 refills | Status: DC

## 2019-02-13 MED ORDER — LORAZEPAM 2 MG/ML IJ SOLN
1.0000 mg | Freq: Once | INTRAMUSCULAR | Status: AC
  Administered 2019-02-13: 1 mg via INTRAMUSCULAR
  Filled 2019-02-13: qty 1

## 2019-02-13 MED ORDER — HYDROMORPHONE HCL 1 MG/ML IJ SOLN
1.0000 mg | Freq: Once | INTRAMUSCULAR | Status: AC
  Administered 2019-02-13: 1 mg via INTRAVENOUS
  Filled 2019-02-13: qty 1

## 2019-02-13 MED ORDER — PROMETHAZINE HCL 25 MG/ML IJ SOLN
25.0000 mg | Freq: Once | INTRAMUSCULAR | Status: AC
  Administered 2019-02-13: 25 mg via INTRAVENOUS
  Filled 2019-02-13: qty 1

## 2019-02-13 MED ORDER — FAMOTIDINE IN NACL 20-0.9 MG/50ML-% IV SOLN
INTRAVENOUS | Status: AC
  Filled 2019-02-13: qty 50

## 2019-02-13 MED ORDER — DIPHENHYDRAMINE HCL 50 MG/ML IJ SOLN
25.0000 mg | Freq: Once | INTRAMUSCULAR | Status: AC
  Administered 2019-02-13: 25 mg via INTRAVENOUS
  Filled 2019-02-13: qty 1

## 2019-02-13 NOTE — ED Notes (Signed)
Pt was adamant about using a purewick in place for her to urinate.  She was given the option to ambulate with assist with this nurse or a bedpan, pt declined both.  States she cannot stand up d/t her leg pain and she cannot urinate on a bedpan.

## 2019-02-13 NOTE — ED Notes (Signed)
Patient advised about side effects of medications and  to avoid driving for a minimum of 4 hours.  Pt verbalizes understanding.  Pt states her daughter can come pick her up whenever she is discharged home.

## 2019-02-13 NOTE — Discharge Instructions (Signed)
We saw in the ER primarily for your rash. Unfortunately, it seems like your rash is getting worse despite you taking the medications.  For now it still appears that the rash is a drug eruption type rash that is severe in nature.  We recommend that you continue taking the medications that are prescribed.  We are adding hydroxyzine to your regimen, as that medication can help you both with anxiety and also with itching.  We would like you to see your primary care doctor for further evaluation of this rash. Return to the emergency room immediately if you start having swelling of your face, swelling of your mouth, tongue, fevers, blisters.

## 2019-02-13 NOTE — ED Notes (Addendum)
This RN attempted to give pt PO ativan as prescribed, pt adamantly refused medicaiton, stating that she can only have IV ativan, PO ativan doesn't work.  Pt refused PO ativan, 2 tablets wasted in sharps container in room.  Pt also insisting that she is now nauseas, wants IV phergan, reporting that she is highly allergic to Zofran.  Pt dissatisfied with IV benadryl dose, wants 50 mg vs the 25 mg prescribed.  Pt dissatisfied with the IV dilaudid dosage (1 mg) insists that she should be getting 2 mg, she is in extreme pain.    MD made aware.  RN and MD returned to pt room where MD explained plan of care with pt.  MD to write new medication orders.

## 2019-02-13 NOTE — ED Notes (Signed)
ED Provider at bedside. 

## 2019-02-13 NOTE — ED Notes (Signed)
Pt states she cannot take pills whole, she has to chew then d/t childhood trauma from chocking on a pill.  Therefore, she declined the benadryl capsule as ordered. [facility] EDP made aware.

## 2019-02-13 NOTE — ED Triage Notes (Signed)
Pt here for rash all over her body that began while taking a medication tardive dyskinesia. Pt was seen at ED and reports rash has gotten worse since. Pt also complains of right sided sciatic nerve pain and joint paint all over her body.

## 2019-02-13 NOTE — ED Provider Notes (Signed)
[facility] DEPT Provider Note   CSN: [id number] Arrival date & time: 02/13/19  1326    History   Chief Complaint Chief Complaint  Patient presents with  . Leg Pain  . Rash    HPI Gina [name] is a 43 y.o. female.     HPI  43 year old female comes in a chief complaint of leg pain and rash. Patient has multiple medical comorbidities and she has psychiatric conditions for which she takes [place].  As a side effect of Latuda she has been having tardive dyskinesia for which she was started on Ingrezza recently.  Patient states that she increased her dose of Ingrezza soon after she started having generalized rash.  Patient had come to the ER on 4/12 and was given 20 mg IV Pepcid, 50 mg IV Benadryl, 125 mg methylprednisolone, 0.3 mg IM epi.  Patient states that her symptoms improved and she went home, however over time her symptoms have gotten worse again.  She is also complaining of back pain that is radiating down her left leg, consistent with her sciatica.  Patient states that her rash is itchy and causing her to be anxious.  She also states that at its worst she noted swelling over the face.  Patient has taken the medications that have been prescribed to her and stopped taking Ingrezza 2 days ago.  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  Past Medical History:  Diagnosis Date  . ABDOMINAL WALL HERNIA 02/27/2010  . Abnormal Pap smear   . ALLERGIC RHINITIS 06/02/2007  . ANOREXIA, CHRONIC 08/07/2008  . ANXIETY 09/15/2010  . Arthritis   . BACK PAIN, THORACIC REGION 07/07/2007  . Bipolar disorder ([place])   . CERVICAL RADICULOPATHY 12/11/2008  . Condyloma acuminatum 04/23/2009  . CONSTIPATION 09/15/2010  . DYSPHAGIA UNSPECIFIED 09/09/2009  . 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 Eating disorder   . ENDOMETRIOSIS 12/16/2009  . FATIGUE 06/11/2010  . Fibromyalgia   . GERD 10/23/2009  . Heart palpitations   . HERPES  SIMPLEX INFECTION 06/11/2010  . LENTIGO 04/23/2009  . LUNG NODULE 06/17/2010  . Narcotic abuse, continuous ([place])   . Ovarian cyst   . Palpitations 10/23/2009  . Panic disorder   . Rheumatoid arthritis(714.0)   . Stricture and stenosis of esophagus 05/13/2010  . TOBACCO ABUSE 08/07/2008  . Urinary tract infection   . UTI (urinary tract infection)     Patient Active Problem List   Diagnosis Date Noted  . Bipolar affective disorder, current episode mixed ([place]) 01/02/2019  . Arthritis 01/02/2019  . Cigarette nicotine dependence without complication 39/76/7341  . Bipolar 1 disorder, mixed, moderate ([facility]) 07/15/2015    Class: Chronic  . 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 Sciatica 01/09/2015  . Reactive hypoglycemia 12/17/2013  . Musculoskeletal malfunction arising from mental factors 04/04/2013  . Incisional hernia 11/15/2012  . Other postprocedural status(V45.89) 11/15/2012  . Abdominal pain 11/11/2012  . Chronic pain syndrome 11/11/2012  . Disorder of sacrum 11/11/2012  . Heartburn 10/04/2011  . DYSPHAGIA 10/04/2011  . Depression with anxiety 09/15/2010  . CONSTIPATION 09/15/2010  . LUNG NODULE 06/17/2010  . HERPES SIMPLEX INFECTION 06/11/2010  . FATIGUE 06/11/2010  . STRICTURE AND STENOSIS OF ESOPHAGUS 05/13/2010  . ABDOMINAL WALL HERNIA 02/27/2010  . ENDOMETRIOSIS 12/16/2009  . GERD 10/23/2009  . PALPITATIONS 10/23/2009  . 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 CONDYLOMA ACUMINATUM 04/23/2009  . LENTIGO 04/23/2009  .  DENTAL PAIN 03/19/2009  . CERVICAL RADICULOPATHY 12/11/2008  . 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 TOBACCO ABUSE 08/07/2008  . BACK PAIN, THORACIC REGION 07/07/2007  . ALLERGIC RHINITIS 06/02/2007    Past Surgical History:  Procedure Laterality Date  . ABDOMINAL SURGERY    . ABDOMINAL WALL MESH  REMOVAL    . ABLATION ON ENDOMETRIOSIS    . CESAREAN SECTION     x2   . DILATION  AND CURETTAGE OF UTERUS     x1   . ENDOMETRIAL ABLATION    . esophageal dilatation x 4    . GUM SURGERY    . HERNIA REPAIR     X3  . PARTIAL HYSTERECTOMY       OB History    Gravida  3   Para  2   Term  2   Preterm  0   AB  1   Living  2     SAB  1   TAB  0   Ectopic  0   Multiple  0   Live Births  1            Home Medications    Prior to Admission medications   Medication Sig Start Date End Date Taking? Authorizing Provider  clonazePAM (KLONOPIN) 0.5 MG tablet Take 0.5 mg by mouth 2 (two) times daily.  08/30/18  Yes [provider]  escitalopram (LEXAPRO) 20 MG tablet Take 30 mg by mouth at bedtime. 12/20/18  Yes [provider]  famotidine (PEPCID) 20 MG tablet Take 1 tablet (20 mg total) by mouth 2 (two) times daily. 02/11/19  Yes Jola Schmidt, MD  INGREZZA 80 MG CAPS Take 1 capsule by mouth daily.  02/01/19  Yes [provider]  lurasidone (LATUDA) 80 MG TABS tablet Take 80 mg by mouth at bedtime.    Yes [provider]  naloxegol oxalate (MOVANTIK) 25 MG TABS tablet Take 25 mg by mouth daily.   Yes [provider]  oxyCODONE-acetaminophen (PERCOCET) 7.5-325 MG tablet Take 1 tablet by mouth 4 (four) times daily.   Yes [provider]  predniSONE (DELTASONE) 20 MG tablet Take 2 tablets (40 mg total) by mouth daily for 5 days. 02/11/19 02/16/19 Yes Jola Schmidt, MD  REXULTI 1 MG TABS Take 1 tablet by mouth daily.  01/19/19  Yes [provider]  cyclobenzaprine (FLEXERIL) 10 MG tablet Take 10 mg by mouth 3 (three) times daily as needed for muscle spasms.  01/19/19   [provider]  diphenhydrAMINE (BENADRYL) 25 MG tablet Take 2 tablets (50 mg total) by mouth every 6 (six) hours as needed for allergies. 02/11/19   Jola Schmidt, MD  promethazine (PHENERGAN) 25 MG tablet Take 1 tablet (25 mg total) by mouth every 6 (six) hours as needed for nausea or vomiting. 01/14/19   Street, [place], PA-C     Family History Family History  Problem Relation Age of Onset  . Depression Mother   . Arthritis Mother   . Hyperlipidemia Father   . Hypertension Father     Social History Social History   Tobacco Use  . Smoking status: Current Every Day Smoker    Packs/day: 1.00    Types: Cigarettes  . Smokeless tobacco: Never Used  Substance Use Topics  . Alcohol use: No  . Drug use: No     Allergies   Lamictal [lamotrigine]; Morphine and related; Nicotine; Zofran; Chantix [varenicline tartrate]; Codeine; Haldol [haloperidol]; Ibuprofen; Lidoderm [lidocaine]; Metaxalone; Sulfa antibiotics;  Sulfasalazine; Toradol [ketorolac tromethamine]; Duloxetine; Gabapentin; Methadone; Pregabalin; Zolpidem; Amoxicillin; Aripiprazole; and Penicillins   Review of Systems Review of Systems  Constitutional: Positive for activity change.  Respiratory: Negative for shortness of breath and wheezing.   Gastrointestinal: Positive for nausea.  Skin: Positive for rash.  Allergic/Immunologic: Negative for environmental allergies, food allergies and immunocompromised state.  All other systems reviewed and are negative.    Physical Exam Updated Vital Signs BP 101/82 (BP Location: Left Arm)   Pulse (!) 103   Temp 98.6 F (37 C) (Oral)   Resp 18   LMP 01/01/2004   SpO2 96%   Physical Exam Vitals signs and nursing note reviewed.  Constitutional:      Appearance: She is well-developed.  HENT:     Head: Normocephalic and atraumatic.     Mouth/Throat:     Mouth: Mucous membranes are dry.     Pharynx: No oropharyngeal exudate or posterior oropharyngeal erythema.  Eyes:     Extraocular Movements: Extraocular movements intact.     Conjunctiva/sclera: Conjunctivae normal.     Pupils: Pupils are equal, round, and reactive to light.  Neck:     Musculoskeletal: Normal range of motion and neck supple.  Cardiovascular:     Rate and Rhythm: Normal rate.  Pulmonary:     Effort: Pulmonary effort is normal.   Abdominal:     General: Bowel sounds are normal.  Skin:    General: Skin is warm and dry.     Findings: Rash present.     Comments: Patient has raised erythematous patch with irregular borders diffusely present over her torso, upper and lower extremity.  The rash is blanching.  There is no crusting, blisters, sloughing or drainage appreciated.  Patient has no lesions in her oral mucosa.  Neurological:     Mental Status: She is alert and oriented to person, place, and time.      ED Treatments / Results  Labs (all labs ordered are listed, but only abnormal results are displayed) Labs Reviewed  COMPREHENSIVE METABOLIC PANEL - Abnormal; Notable for the following components:      Result Value   Glucose, Bld 107 (*)    Calcium 8.7 (*)    All other components within normal limits  CBC WITH DIFFERENTIAL/PLATELET - Abnormal; Notable for the following components:   WBC 15.5 (*)    MCV 101.2 (*)    Neutro Abs 14.2 (*)    Lymphs Abs 0.5 (*)    All other components within normal limits  C-REACTIVE PROTEIN - Abnormal; Notable for the following components:   CRP 1.5 (*)    All other components within normal limits  SEDIMENTATION RATE    EKG None  Radiology No results found.  Procedures Procedures (including critical care time)  Medications Ordered in ED Medications  diphenhydrAMINE (BENADRYL) injection 25 mg (has no administration in time range)  HYDROmorphone (DILAUDID) injection 1 mg (1 mg Intravenous Given 02/13/19 1447)  diphenhydrAMINE (BENADRYL) injection 25 mg (25 mg Intravenous Given 02/13/19 1446)  dexamethasone (DECADRON) injection 10 mg (10 mg Intravenous Given 02/13/19 1540)  famotidine (PEPCID) 10 mg in sodium chloride 0.9 % 25 mL (10 mg Intravenous New Bag/Given 02/13/19 1555)  LORazepam (ATIVAN) injection 1 mg (1 mg Intramuscular Given 02/13/19 1544)  EPINEPHrine (EPI-PEN) injection 0.3 mg (0.3 mg Intramuscular Given 02/13/19 1546)  promethazine (PHENERGAN) injection  25 mg (25 mg Intravenous Given 02/13/19 1605)     Initial Impression / Assessment and Plan / ED Course  I  have reviewed the triage vital signs and the nursing notes.  Pertinent labs & imaging results that were available during my care of the patient were reviewed by me and considered in my medical decision making (see chart for details).  Clinical Course as of Feb 12 1642  Tue Feb 13, 2019  1638 Patient requested that she get to milligram hydromorphone and that her Ativan be given IV.  She also is requesting IV promethazine.  I informed patient that her 1 mg IV hydromorphone is weight-based, and was already a more generous dose than what I normally give.  I informed her that the Ativan is given orally and she does not have any panic attack right now.  Finally informed her that she does not need promethazine, as Benadryl has antiemetic effect and so does Ativan and she is not vomiting.  Patient was not very happy about the plan, stating that her body does not respond to 1 mg IV Dilaudid and oral Ativan.  I informed her that we will give her IV promethazine and IM Ativan, but again I do not see any reason for her to get more IV Dilaudid or even IV Ativan.   She has been informed of our clinical diagnosis for her urticarial rash. I have discussed strict ER return precautions with the patient.  She is wanting to make sure that she does not get worse when she is discharged, and I informed her that there is no way of [place] giving guarantee to anyone that there symptoms might not get worse, which is why we discussed strict ER return precautions in first place. Anticipate discharge.   [AN]    Clinical Course User Index [AN] Gina Biles, MD       43 year old female comes in a chief complaint of back pain and rash.  Her back pain is consistent with sciatica based on clinical history.  Reports that she has had sciatica in the past and has taken 7.5 mg of oxycodone at home without significant relief.   She states that she only gets better with Dilaudid.  Normally I do not treat sciatica with hydromorphone, however patient does appear uncomfortable because of her rash and anxious -therefore we will give her 1 round of IV hydromorphone.  There is no clinical concern for cord compression or infection around her spine.  Additionally patient's main complaint is rash.  She reports that the rash started soon after she started Ingrezza.  She has known history of Stevens-Johnson syndrome.  Her rash is getting worse despite taking medications that were prescribed 2 days ago.  Patient has normal-appearing oral mucosa and she has no eye related complaints or conjunctival chemosis.  There is also no blisters or sloughing of the skin.  It does not appear that patient has Stevens-Johnson syndrome at this time.   Additionally, I considered disseminated zoster and disseminated HSV, but patient has no crusting Sherrilee Gilles Ronni Rumble appearing rash which are typical of HSV infection.  Furthermore there is no oral involvement, ocular involvement, neurologic involvement at this time.  And finally patient is not immunosuppressed or immunocompromised.  Patient's lesion is blanching.  This does not appear to be a purpura.  CBC ordered to ensure there is no thrombocytopenia.  She did have a couple of areas where she had erythema multiforme type lesion, most notably in the left arm.   Her rash is typical of urticaria, but it appears to be more disseminated.  There is blanching, which again is reassuring.  Perhaps she  is having a more severe drug eruption or morbilliform rash.  There could be a delayed hypersensitivity component to her symptoms, which is why she is not improving.  Plan for now is for patient to continue with the steroids, Benadryl.  We will add hydroxyzine to her treatment plan.  We will recommend that she return to the ER if she starts having fevers, oral mucosal involvement, ocular involvement.  Otherwise will  recommend PCP follow-up for Derm follow-up.  Final Clinical Impressions(s) / ED Diagnoses   Final diagnoses:  Morbilliform rash  Sciatica of left side    ED Discharge Orders    None       Gina Biles, MD 02/13/19 1643

## 2019-02-14 ENCOUNTER — Ambulatory Visit: Payer: Medicare Other | Admitting: Family Medicine

## 2019-02-14 ENCOUNTER — Telehealth: Payer: Medicare Other | Admitting: Nurse Practitioner

## 2019-02-14 ENCOUNTER — Other Ambulatory Visit: Payer: Self-pay

## 2019-02-14 ENCOUNTER — Ambulatory Visit (INDEPENDENT_AMBULATORY_CARE_PROVIDER_SITE_OTHER): Payer: Medicare Other | Admitting: Family Medicine

## 2019-02-14 ENCOUNTER — Encounter: Payer: Self-pay | Admitting: Family Medicine

## 2019-02-14 DIAGNOSIS — R21 Rash and other nonspecific skin eruption: Secondary | ICD-10-CM

## 2019-02-14 DIAGNOSIS — L249 Irritant contact dermatitis, unspecified cause: Secondary | ICD-10-CM

## 2019-02-14 NOTE — Progress Notes (Signed)
Based on what you shared with me it looks like you have contact dermatitis,that should be evaluated in a face to face office visit. According to your chart you are on steroids. ifno better then you need to be seen in a face to face visit to make sure you are treated properly   NOTE: If you entered your credit card information for this eVisit, you will not be charged. You may see a "hold" on your card for the $30 but that hold will drop off and you will not have a charge processed.  If you are having a true medical emergency please call 911.  If you need an urgent face to face visit, [facility] has four urgent care centers for your convenience.  If you need care fast and have a high deductible or no insurance consider:   [email or web address] to reserve your spot online an avoid wait times  [facility] [address] [place], [place] [address] 8 am to 8 pm Monday-Friday 10 am to 4 pm Saturday-Sunday *Across the street from [employer]  [place], [address] 8 am to 5 pm Monday-Friday * In the [facility] on the [facility]   The following sites will take your  insurance:  . [facility] [place] a Provider at this Location  [address] [place], [place] [address] . 10 am to 8 pm Monday-Friday . 12 pm to 8 pm Saturday-Sunday   . [facility] [place] a Provider at this Location  [address] [place], [place], [place] [address] . 8 am to 8 pm Monday-Friday . 9 am to 6 pm Saturday . 11 am to 6 pm Sunday   . [facility] at [place] Get Driving Directions  [address] [place], [place] [address] . 8 am to 8 pm Monday-Friday . 8 am to 4 pm Saturday-Sunday   Your e-visit answers were reviewed by a board certified  advanced clinical practitioner to complete your personal care plan.  5 minutes spent reviewing and documenting in chart.

## 2019-02-14 NOTE — Progress Notes (Signed)
Virtual Visit via Telephone Note  I connected with Gina [name] on 02/14/19 at  2:00 PM EDT by telephone and verified that I am speaking with the correct person using two identifiers.   I discussed the limitations, risks, security and privacy concerns of performing an evaluation and management service by telephone and the availability of in person appointments. I also discussed with the patient that there may be a patient responsible charge related to this service. The patient expressed understanding and agreed to proceed.  Location patient: home Location provider: home office Participants present for the call: patient, provider Patient did have a visit in the prior 7 days to address this/these issue(s).   History of Present Illness: Pt upset she did not get a phone call this am and that she could not be seen in the office.  Pt was on the schedule as an e-visit, however did not set this up.  Pt started on Ingrezza for tardive dyskinesia.  States the med caused a rash on entire body, itching, joint pain.  Pt denies other changes in meds, foods, soaps, lotions, etc.  Pt seen in ED on Sunday 4/12.  Given pepcid, benadryl, epi, methylprednisolone.  Ingrezza d/c'd.  Pt felt better and rash was improving but later the rash returned.  Pt went back to the ED on 4/14 for rash and leg pain.  Per chart review pt requesting meds including diludid.  Pt was given  Benadryl, dilaudid, dexamethasone, pepcid, ativan, epi, phenergan in ED. Pt feels like she has also urinated on her self.  This started yesterday.  Pt drinking mostly gatorade zero, 1 cup of coffee, and some water daily.  Denies dysuria, increased pressure, frequency.  Pt is demanding a referral to [facility] as she states she has an "over active immune system".   Observations/Objective: Patient demanding of the phone, continues to focus on missed appointment this am/the reason no one called her.  At times unwilling to listen/reason. I do not  appreciate any SOB. Speech and thought processing are grossly intact. Patient reported vitals: n/a  Assessment and Plan: Rash -pt advised that it would be difficult to advise her on how to proceed/diagnose her rash without it being seen. -pt given options of scheduling e-video visit/providing pictures on mychart.  Pt becomes upset stating she can't do that.  Pt advised instructions could be provided or she could have someone help her, but she declines. Pt later states someone from the office needs to call her back in an hour. -pt continues to state she needs a referral to [facility] so she can be seen faster.   -pt advised if she was unwilling to do anything asked she may benefit from finding a new provider.  Follow Up Instructions: F/u prn  I did not refer this patient for an OV in the next 24 hours for this/these issue(s).  I discussed the assessment and treatment plan with the patient. The patient was provided an opportunity to ask questions and all were answered. The patient agreed with the plan and demonstrated an understanding of the instructions.   The patient was advised to call back or seek an in-person evaluation if the symptoms worsen or if the condition fails to improve as anticipated.  I provided  14 minutes of non-face-to-face time during this encounter.   Billie Ruddy, MD

## 2019-02-14 NOTE — Progress Notes (Signed)
Pt missed e-visit appt on 02/14/19 at 11:00 AM EDT

## 2019-02-15 ENCOUNTER — Encounter: Payer: Self-pay | Admitting: Family Medicine

## 2019-02-15 ENCOUNTER — Other Ambulatory Visit: Payer: Self-pay | Admitting: Internal Medicine

## 2019-02-23 ENCOUNTER — Encounter (HOSPITAL_COMMUNITY): Payer: Self-pay | Admitting: Emergency Medicine

## 2019-02-23 ENCOUNTER — Emergency Department (HOSPITAL_COMMUNITY)
Admission: EM | Admit: 2019-02-23 | Discharge: 2019-02-23 | Disposition: A | Discharge status: Home / Self Care | Payer: Medicare Other | Attending: Emergency Medicine | Admitting: Emergency Medicine

## 2019-02-23 DIAGNOSIS — M5430 Sciatica, unspecified side: Secondary | ICD-10-CM | POA: Diagnosis not present

## 2019-02-23 DIAGNOSIS — Z5321 Procedure and treatment not carried out due to patient leaving prior to being seen by health care provider: Secondary | ICD-10-CM | POA: Insufficient documentation

## 2019-02-23 NOTE — ED Notes (Signed)
PT enter back into department this writer ask if PT willing to stay and be seen PT lower self onto floor. RN have been made aware

## 2019-02-23 NOTE — ED Notes (Signed)
Pt stated to this writer that she was going to leave and did not want to wait any longer.

## 2019-02-23 NOTE — ED Triage Notes (Addendum)
Per EMS-patient having sciatica pain radiating down left leg-chronic issue-nerve block done yesterday-states patient fell on front porch today, states left leg gave out-states she requested pain meds from EMS-states she was sitting on stair-states she needed pain meds so she could get up

## 2019-02-23 NOTE — ED Notes (Signed)
Patient was coming in from lobby, left triage room to go to another hospital because of wait-patient was coming thru triage doors and proceeded to lower herself to the floor-incidence was witnessed by off duty [employer], EMS, charge RN, and NT-no overt s/s's of injury-

## 2019-03-12 ENCOUNTER — Other Ambulatory Visit: Payer: Self-pay

## 2019-03-12 ENCOUNTER — Telehealth (INDEPENDENT_AMBULATORY_CARE_PROVIDER_SITE_OTHER): Payer: Medicare Other | Admitting: Family Medicine

## 2019-03-12 DIAGNOSIS — T7840XS Allergy, unspecified, sequela: Secondary | ICD-10-CM

## 2019-03-12 DIAGNOSIS — B001 Herpesviral vesicular dermatitis: Secondary | ICD-10-CM

## 2019-03-12 DIAGNOSIS — M797 Fibromyalgia: Secondary | ICD-10-CM

## 2019-03-12 DIAGNOSIS — F17210 Nicotine dependence, cigarettes, uncomplicated: Secondary | ICD-10-CM

## 2019-03-12 DIAGNOSIS — F3162 Bipolar disorder, current episode mixed, moderate: Secondary | ICD-10-CM | POA: Diagnosis not present

## 2019-03-12 DIAGNOSIS — M5432 Sciatica, left side: Secondary | ICD-10-CM

## 2019-03-12 NOTE — Progress Notes (Signed)
Virtual Visit Note  I connected with patient on 03/12/19 at 949am by phone and verified that I am speaking with the correct person using two identifiers. Gina [name] is currently located at home and patient is currently with them during visit. The provider, Rutherford Guys, MD is located in their office at time of visit.  I discussed the limitations, risks, security and privacy concerns of performing an evaluation and management service by telephone and the availability of in person appointments. I also discussed with the patient that there may be a patient responsible charge related to this service. The patient expressed understanding and agreed to proceed.   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 Recently treated with piriformis injections, second one very painful, went to the hosp due to pain pmp reviewed Dr Nancy Fetter also prescribed her clonazepam Told at one point that she might have RA - she denies any sx  Having reactions to multiple medications - now has epi -pen, has referral to [facility], sees them in August 2020?  On disability for bipolar disorder and chronic back pain  Partial hysterectomy for endometriosis and adenomyosis, Dr Nori Riis  Abdominal hernia finally surgically treated after 9 surgeries  Sees dentist regularly, has had sign dental issues, thought that it might be related to remote treatment with depo-luperon  Does not see eye doctor  Continues to smoke,1ppd, not interested in quitting  Takes valtrex  prn for cold sores  She otherwise feels in her baseline health and has no acute concerns today  Allergies  Allergen Reactions  . Lamictal [Lamotrigine] Anaphylaxis and Other (See Comments)    STEVEN JOHNSON'S SYNDROME  . Morphine And Related Other (See Comments)    Makes pt "very mean"  . Nicotine Swelling and Palpitations    Other reaction(s): Respiratory Distress (ALLERGY/intolerance), Swelling (ALLERGY/intolerance), Tachycardia / Palpitations  (intolerance) Patches, lozenges Patches caused palpations lozenges throat swelled  . Zofran Anaphylaxis  . Chantix [Varenicline Tartrate] Other (See Comments)     Diaphoresis, sweating.  Night terrors.  "went crazy"  . Codeine Nausea And Vomiting  . Haldol [Haloperidol] Itching, Rash and Other (See Comments)    FLUSHING  . Ibuprofen Nausea And Vomiting  . Lidoderm [Lidocaine] Other (See Comments)    DOESN'T WORK FOR PATIENT  . Metaxalone Other (See Comments)    Pt does not remember reaction, REAL BAD REACTION  . Sulfa Antibiotics Diarrhea and Other (See Comments)    GI PAINS ALSO  . Sulfasalazine Diarrhea    GI PAINS  . Toradol [Ketorolac Tromethamine] Nausea And Vomiting  . Duloxetine Other (See Comments)    unk  . Gabapentin Nausea And Vomiting  . Methadone     seizures  . Pregabalin     unk  . Zolpidem     Mental status changes  . Amoxicillin Rash    Has patient had a PCN reaction causing immediate rash, facial/tongue/throat swelling, SOB or lightheadedness with hypotension: No Has patient had a PCN reaction causing severe rash involving mucus membranes  or skin necrosis: No Has patient had a PCN reaction that required hospitalization: No Has patient had a PCN reaction occurring within the last 10 years: No If all of the above answers are "NO", then may proceed with Cephalosporin use.   . Aripiprazole Anxiety    hallucinations  . Penicillins Rash    Other reaction(s): Urticaria / Hives (ALLERGY) Has patient had a PCN  reaction causing immediate rash, facial/tongue/throat swelling, SOB or lightheadedness with hypotension: No Has patient had a PCN reaction causing severe rash involving mucus membranes or skin necrosis: No Has patient had a PCN reaction that required hospitalization: No Has patient had a PCN reaction occurring within the last 10 years: No If all of the above answers are "NO", then may proceed with Cephalosporin use.    Prior to Admission medications   Medication Sig Start Date End Date Taking? Authorizing Provider  clonazePAM (KLONOPIN) 0.5 MG tablet Take 0.5 mg by mouth 2 (two) times daily.  08/30/18   [provider]  cyclobenzaprine (FLEXERIL) 10 MG tablet Take 10 mg by mouth 3 (three) times daily as needed for muscle spasms.  01/19/19   [provider]  diphenhydrAMINE (BENADRYL) 25 MG tablet Take 2 tablets (50 mg total) by mouth every 6 (six) hours as needed for allergies. 02/11/19   Jola Schmidt, MD  escitalopram (LEXAPRO) 20 MG tablet Take 30 mg by mouth at bedtime. 12/20/18   [provider]  famotidine (PEPCID) 20 MG tablet Take 1 tablet (20 mg total) by mouth 2 (two) times daily. 02/11/19   Jola Schmidt, MD  hydrOXYzine (ATARAX/VISTARIL) 25 MG tablet Take 1 tablet (25 mg total) by mouth every 6 (six) hours. 02/13/19   Varney Biles, MD  lurasidone (LATUDA) 80 MG TABS tablet Take 80 mg by mouth at bedtime.     [provider]  naloxegol oxalate (MOVANTIK) 25 MG TABS tablet Take 25 mg by mouth daily.    [provider]  oxyCODONE-acetaminophen (PERCOCET) 7.5-325 MG tablet Take 1 tablet by mouth 4 (four) times daily.    [provider]  promethazine (PHENERGAN) 25 MG tablet Take 1 tablet (25 mg total) by mouth every 6 (six) hours as needed for nausea or vomiting. 01/14/19   Street, Talmo, PA-C  REXULTI 1 MG TABS Take 1 tablet by mouth daily.  01/19/19   [provider]  valACYclovir (VALTREX) 1000 MG tablet TK 2 TS PO BID  03/08/19   [provider]    Past Medical History:  Diagnosis Date  . ABDOMINAL WALL HERNIA 02/27/2010  . Abnormal Pap smear   . ALLERGIC RHINITIS 06/02/2007  . ANOREXIA, CHRONIC 08/07/2008  . ANXIETY 09/15/2010  . Arthritis   . BACK PAIN, THORACIC REGION 07/07/2007  . Bipolar disorder ([place])   . CERVICAL RADICULOPATHY 12/11/2008  . Condyloma acuminatum 04/23/2009  . CONSTIPATION 09/15/2010  . DYSPHAGIA UNSPECIFIED 09/09/2009  . 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 Eating disorder   . ENDOMETRIOSIS 12/16/2009  . FATIGUE 06/11/2010  . Fibromyalgia   . GERD 10/23/2009  . Heart palpitations   . HERPES SIMPLEX INFECTION 06/11/2010  . LENTIGO 04/23/2009  . LUNG NODULE 06/17/2010  . Narcotic abuse, continuous ([place])   . Ovarian cyst   . Palpitations 10/23/2009  . Panic disorder   . Rheumatoid arthritis(714.0)   . Stricture and stenosis of esophagus 05/13/2010  . TOBACCO ABUSE 08/07/2008  . Urinary tract infection   . UTI (urinary tract infection)     Past Surgical History:  Procedure Laterality Date  . ABDOMINAL SURGERY    . ABDOMINAL WALL MESH  REMOVAL    . ABLATION ON ENDOMETRIOSIS    . CESAREAN SECTION     x2   . DILATION AND CURETTAGE OF UTERUS     x1   . ENDOMETRIAL ABLATION    . esophageal dilatation x 4    . GUM SURGERY    . HERNIA REPAIR     X3  . PARTIAL HYSTERECTOMY      Social History   Tobacco Use  . Smoking status: Current Every Day Smoker    Packs/day: 1.00    Types: Cigarettes  . Smokeless tobacco: Never Used  Substance Use Topics  . Alcohol use: No    Family History  Problem Relation Age of Onset  . Depression Mother   . Arthritis Mother   . Hyperlipidemia Father   . Hypertension Father     ROS Per hpi  Objective  Vitals as reported by the patient: none   ASSESSMENT and PLAN  1. Bipolar 1 disorder, mixed, moderate ([place]) 2. Fibromyalgia 3. Sciatica of left side 4. Cigarette nicotine dependence without complication 5. Recurrent cold  sores 6. Allergic reaction to drug, sequela  Patient with no acute concerns. Health at baseline. Under specialists care per above.  PMH, PSH, meds, allergies, Fhx, Shx, reviewed with patient today.  FOLLOW-UP: 4 months, after appt with [facility]   The above assessment and management plan was discussed with the patient. The patient verbalized understanding of and has agreed to the management plan. Patient is aware to call the clinic if symptoms persist or worsen. Patient is aware when to return to the clinic for a follow-up visit. Patient educated on when it is appropriate to go to the emergency department.    I provided 30 minutes of non-face-to-face time during this encounter.  Rutherford Guys, MD Primary Care at [facility] [place], [place] [address] Ph.  [phone] Fax [phone]

## 2019-03-12 NOTE — Progress Notes (Signed)
Having sciatica pain, taking oxycodone and flexeril for the pain which does help with the pain. Found out that she is allergic Ingrezza, caused her to go the er twice for epi pen injection this past April. Completed gad7 form, scored a 6. No medication refills needed at this time

## 2019-03-16 ENCOUNTER — Emergency Department (HOSPITAL_COMMUNITY)
Admission: EM | Admit: 2019-03-16 | Discharge: 2019-03-16 | Disposition: A | Discharge status: Home / Self Care | Payer: Medicare Other | Attending: Emergency Medicine | Admitting: Emergency Medicine

## 2019-03-16 ENCOUNTER — Encounter (HOSPITAL_COMMUNITY): Payer: Self-pay | Admitting: Emergency Medicine

## 2019-03-16 DIAGNOSIS — M5432 Sciatica, left side: Secondary | ICD-10-CM | POA: Diagnosis not present

## 2019-03-16 DIAGNOSIS — W19XXXA Unspecified fall, initial encounter: Secondary | ICD-10-CM | POA: Diagnosis not present

## 2019-03-16 DIAGNOSIS — Z79899 Other long term (current) drug therapy: Secondary | ICD-10-CM | POA: Insufficient documentation

## 2019-03-16 DIAGNOSIS — F17210 Nicotine dependence, cigarettes, uncomplicated: Secondary | ICD-10-CM | POA: Diagnosis not present

## 2019-03-16 DIAGNOSIS — R0689 Other abnormalities of breathing: Secondary | ICD-10-CM | POA: Diagnosis not present

## 2019-03-16 DIAGNOSIS — M5489 Other dorsalgia: Secondary | ICD-10-CM | POA: Diagnosis not present

## 2019-03-16 LAB — URINALYSIS, ROUTINE W REFLEX MICROSCOPIC
Bilirubin Urine: NEGATIVE
Glucose, UA: NEGATIVE mg/dL
Hgb urine dipstick: NEGATIVE
Ketones, ur: NEGATIVE mg/dL
Leukocytes,Ua: NEGATIVE
Nitrite: NEGATIVE
Protein, ur: NEGATIVE mg/dL
Specific Gravity, Urine: 1.006 (ref 1.005–1.030)
pH: 6 (ref 5.0–8.0)

## 2019-03-16 LAB — CBC WITH DIFFERENTIAL/PLATELET
Abs Immature Granulocytes: 0.01 10*3/uL (ref 0.00–0.07)
Basophils Absolute: 0 10*3/uL (ref 0.0–0.1)
Basophils Relative: 0 %
Eosinophils Absolute: 0.2 10*3/uL (ref 0.0–0.5)
Eosinophils Relative: 2 %
HCT: 39 % (ref 36.0–46.0)
Hemoglobin: 12.8 g/dL (ref 12.0–15.0)
Immature Granulocytes: 0 %
Lymphocytes Relative: 33 %
Lymphs Abs: 2.2 10*3/uL (ref 0.7–4.0)
MCH: 33.9 pg (ref 26.0–34.0)
MCHC: 32.8 g/dL (ref 30.0–36.0)
MCV: 103.2 fL — ABNORMAL HIGH (ref 80.0–100.0)
Monocytes Absolute: 0.6 10*3/uL (ref 0.1–1.0)
Monocytes Relative: 9 %
Neutro Abs: 3.8 10*3/uL (ref 1.7–7.7)
Neutrophils Relative %: 56 %
Platelets: 185 10*3/uL (ref 150–400)
RBC: 3.78 MIL/uL — ABNORMAL LOW (ref 3.87–5.11)
RDW: 12.1 % (ref 11.5–15.5)
WBC: 6.8 10*3/uL (ref 4.0–10.5)
nRBC: 0 % (ref 0.0–0.2)

## 2019-03-16 LAB — BASIC METABOLIC PANEL
Anion gap: 8 (ref 5–15)
BUN: 13 mg/dL (ref 6–20)
CO2: 21 mmol/L — ABNORMAL LOW (ref 22–32)
Calcium: 7.4 mg/dL — ABNORMAL LOW (ref 8.9–10.3)
Chloride: 110 mmol/L (ref 98–111)
Creatinine, Ser: 0.5 mg/dL (ref 0.44–1.00)
GFR calc Af Amer: >60 mL/min (ref >60)
GFR calc non Af Amer: >60 mL/min (ref >60)
Glucose, Bld: 76 mg/dL (ref 70–99)
Potassium: 3.9 mmol/L (ref 3.5–5.1)
Sodium: 139 mmol/L (ref 135–145)

## 2019-03-16 MED ORDER — PROMETHAZINE HCL 25 MG/ML IJ SOLN
25.0000 mg | Freq: Once | INTRAMUSCULAR | Status: AC
  Administered 2019-03-16: 25 mg via INTRAVENOUS
  Filled 2019-03-16: qty 1

## 2019-03-16 MED ORDER — HYDROMORPHONE HCL 1 MG/ML IJ SOLN
1.0000 mg | Freq: Once | INTRAMUSCULAR | Status: AC
  Administered 2019-03-16: 18:00:00 1 mg via INTRAVENOUS
  Filled 2019-03-16: qty 1

## 2019-03-16 MED ORDER — HYDROMORPHONE HCL 1 MG/ML IJ SOLN
1.0000 mg | Freq: Once | INTRAMUSCULAR | Status: AC
  Administered 2019-03-16: 1 mg via INTRAVENOUS
  Filled 2019-03-16: qty 1

## 2019-03-16 MED ORDER — SODIUM CHLORIDE 0.9 % IV BOLUS
500.0000 mL | Freq: Once | INTRAVENOUS | Status: AC
  Administered 2019-03-16: 17:00:00 500 mL via INTRAVENOUS

## 2019-03-16 MED ORDER — METHOCARBAMOL 1000 MG/10ML IJ SOLN
1000.0000 mg | Freq: Once | INTRAVENOUS | Status: DC
  Filled 2019-03-16: qty 10

## 2019-03-16 MED ORDER — DEXAMETHASONE SODIUM PHOSPHATE 10 MG/ML IJ SOLN
10.0000 mg | Freq: Once | INTRAMUSCULAR | Status: AC
  Administered 2019-03-16: 10 mg via INTRAVENOUS
  Filled 2019-03-16: qty 1

## 2019-03-16 NOTE — ED Provider Notes (Signed)
[facility] DEPT Provider Note   CSN: [id number] Arrival date & time: 03/16/19  1538    History   Chief Complaint Chief Complaint  Patient presents with  . Sciatica    HPI Gina [name] is a 43 y.o. female with a past medical history of bipolar 1, GERD, fibromyalgia, chronic pain syndrome, recurrent left-sided sciatica with grossly normal MRI, status post partial hysterectomy, endometrial ablation, who presents today for evaluation of a flare of her left leg sciatica.  She reports that this feels consistent with her usual sciatic flare.  She reports that the pain starts in her lower back and radiates down her left leg through her entire toes.  She has been seen by IR and neurology for this and frequently gets injections.  She denies any fevers or specific injury.  She does note that yesterday afternoon she took a walk and after that her pain got significantly worse.  She was given 100 MCG fentanyl in route by EMS.  She reports that she has been intermittently urinating on herself.  This is only present when she is in severe pain, and she is able to feel the urine simply not stop it.  She denies any changes to bowel function, no numbness or tingling.  Chart review shows that this concern has been already related to her IR doctor Dr. Ermalene Postin on 02/23/2019 who recommended additional injections  She denies any personal history of cancer or injection drug use.  She reports that she is able to feel when she urinates.  She denies any saddle anesthesia.  No abdominal pain, she does have nausea and vomiting when the pain gets severe.     HPI  Past Medical History:  Diagnosis Date  . ABDOMINAL WALL HERNIA 02/27/2010  . Abnormal Pap smear   . ALLERGIC RHINITIS 06/02/2007  . ANOREXIA, CHRONIC 08/07/2008  . ANXIETY 09/15/2010  . Arthritis   . BACK PAIN, THORACIC REGION 07/07/2007  . Bipolar disorder ([place])   . CERVICAL RADICULOPATHY 12/11/2008  . Condyloma acuminatum  04/23/2009  . CONSTIPATION 09/15/2010  . DYSPHAGIA UNSPECIFIED 09/09/2009  . 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 Eating disorder   . ENDOMETRIOSIS 12/16/2009  . FATIGUE 06/11/2010  . Fibromyalgia   . GERD 10/23/2009  . Heart palpitations   . HERPES SIMPLEX INFECTION 06/11/2010  . LENTIGO 04/23/2009  . LUNG NODULE 06/17/2010  . Narcotic abuse, continuous ([place])   . Ovarian cyst   . Palpitations 10/23/2009  . Panic disorder   . Rheumatoid arthritis(714.0)   . Stricture and stenosis of esophagus 05/13/2010  . TOBACCO ABUSE 08/07/2008  . Urinary tract infection   . UTI (urinary tract infection)     Patient Active Problem List   Diagnosis Date Noted  . Bipolar affective disorder, current episode mixed ([place]) 01/02/2019  . Arthritis 01/02/2019  . Cigarette nicotine dependence without complication 40/98/1191  . Bipolar 1 disorder, mixed, moderate ([facility]) 07/15/2015    Class: Chronic  . 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 Sciatica 01/09/2015  . Reactive hypoglycemia 12/17/2013  . Musculoskeletal malfunction arising from mental factors 04/04/2013  . Incisional hernia 11/15/2012  . Other postprocedural status(V45.89) 11/15/2012  . Abdominal pain 11/11/2012  . Chronic pain syndrome 11/11/2012  . Disorder of sacrum 11/11/2012  . Heartburn 10/04/2011  . DYSPHAGIA 10/04/2011  . Depression with anxiety 09/15/2010  . CONSTIPATION 09/15/2010  . LUNG NODULE 06/17/2010  . Herpes simplex virus (HSV) infection 06/11/2010  . FATIGUE 06/11/2010  . STRICTURE  AND STENOSIS OF ESOPHAGUS 05/13/2010  . ABDOMINAL WALL HERNIA 02/27/2010  . ENDOMETRIOSIS 12/16/2009  . GERD 10/23/2009  . PALPITATIONS 10/23/2009  . 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 CONDYLOMA ACUMINATUM 04/23/2009  . LENTIGO 04/23/2009  . DENTAL PAIN 03/19/2009  . CERVICAL RADICULOPATHY 12/11/2008  . 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 TOBACCO ABUSE 08/07/2008  .  BACK PAIN, THORACIC REGION 07/07/2007  . ALLERGIC RHINITIS 06/02/2007    Past Surgical History:  Procedure Laterality Date  . ABDOMINAL SURGERY    . ABDOMINAL WALL MESH  REMOVAL    . ABLATION ON ENDOMETRIOSIS    . CESAREAN SECTION     x2   . DILATION AND CURETTAGE OF UTERUS     x1   . ENDOMETRIAL ABLATION    . esophageal dilatation x 4    . GUM SURGERY    . HERNIA REPAIR     X3  . PARTIAL HYSTERECTOMY       OB History    Gravida  3   Para  2   Term  2   Preterm  0   AB  1   Living  2     SAB  1   TAB  0   Ectopic  0   Multiple  0   Live Births  1            Home Medications    Prior to Admission medications   Medication Sig Start Date End Date Taking? Authorizing Provider  clonazePAM (KLONOPIN) 0.5 MG tablet Take 0.5 mg by mouth 2 (two) times daily.  08/30/18   [provider]  cyclobenzaprine (FLEXERIL) 10 MG tablet Take 10 mg by mouth 3 (three) times daily as needed for muscle spasms.  01/19/19   [provider]  diphenhydrAMINE (BENADRYL) 25 MG tablet Take 2 tablets (50 mg total) by mouth every 6 (six) hours as needed for allergies. 02/11/19   Jola Schmidt, MD  escitalopram (LEXAPRO) 20 MG tablet Take 30 mg by mouth at bedtime. 12/20/18   [provider]  famotidine (PEPCID) 20 MG tablet Take 1 tablet (20 mg total) by mouth 2 (two) times daily. 02/11/19   Jola Schmidt, MD  hydrOXYzine (ATARAX/VISTARIL) 25 MG tablet Take 1 tablet (25 mg total) by mouth every 6 (six) hours. 02/13/19   Varney Biles, MD  lurasidone (LATUDA) 80 MG TABS tablet Take 80 mg by mouth at bedtime.     [provider]  naloxegol oxalate (MOVANTIK) 25 MG TABS tablet Take 25 mg by mouth daily.    [provider]  oxyCODONE-acetaminophen (PERCOCET) 7.5-325 MG tablet Take 1 tablet by mouth 4 (four) times daily.    [provider]  promethazine (PHENERGAN) 25 MG tablet Take 1 tablet (25 mg total) by mouth every 6 (six) hours as  needed for nausea or vomiting. 01/14/19   Street, Rozel, PA-C  REXULTI 1 MG TABS Take 1 tablet by mouth daily.  01/19/19   [provider]  valACYclovir (VALTREX) 1000 MG tablet TK 2 TS PO BID 03/08/19   [provider]    Family History Family History  Problem Relation Age of Onset  . Depression Mother   . Arthritis Mother   . Hyperlipidemia Father   . Hypertension Father     Social History Social History   Tobacco Use  . Smoking status: Current Every Day Smoker    Packs/day: 1.00    Types: Cigarettes  .  Smokeless tobacco: Never Used  Substance Use Topics  . Alcohol use: No  . Drug use: No     Allergies   Lamictal [lamotrigine]; Morphine and related; Nicotine; Zofran; Chantix [varenicline tartrate]; Codeine; Haldol [haloperidol]; Ibuprofen; Lidoderm [lidocaine]; Metaxalone; Sulfa antibiotics; Sulfasalazine; Toradol [ketorolac tromethamine]; Duloxetine; Gabapentin; Methadone; Pregabalin; Zolpidem; Amoxicillin; Aripiprazole; and Penicillins   Review of Systems Review of Systems  Constitutional: Negative for chills and fever.  HENT: Negative for congestion and ear pain.   Respiratory: Negative for cough and shortness of breath.   Cardiovascular: Negative for chest pain, palpitations and leg swelling.  Gastrointestinal: Positive for nausea and vomiting (From pain). Negative for abdominal pain.  Genitourinary: Negative for dysuria.       She reports that when she is in severe pain she will occasionally urinate on herself.  This is intermittent.  All other systems reviewed and are negative.    Physical Exam Updated Vital Signs BP 102/64 (BP Location: Left Arm)   Pulse 95   Temp 99.2 F (37.3 C) (Oral)   Resp 20   LMP 01/01/2004   SpO2 96%   Physical Exam Vitals signs and nursing note reviewed.  Constitutional:      Appearance: She is well-developed.     Comments: Appears uncomfortable  HENT:     Head: Normocephalic and atraumatic.      Mouth/Throat:     Mouth: Mucous membranes are moist.  Eyes:     Conjunctiva/sclera: Conjunctivae normal.  Neck:     Musculoskeletal: Neck supple.  Cardiovascular:     Rate and Rhythm: Normal rate and regular rhythm.     Pulses: Normal pulses.     Heart sounds: Normal heart sounds. No murmur.     Comments: Left leg 2+ DP/PT pulse. Pulmonary:     Effort: Pulmonary effort is normal. No respiratory distress.     Breath sounds: Normal breath sounds.  Abdominal:     General: Abdomen is flat. Bowel sounds are normal. There is no distension.     Palpations: Abdomen is soft.     Tenderness: There is no abdominal tenderness.  Musculoskeletal:     Right lower leg: No edema.     Left lower leg: No edema.     Comments: There is generalized tenderness to palpation over the lower back, primarily over the left buttock and paraspinal muscles.  Palpation over the left sciatic notch both re-creates and exacerbates her pain and radiation.  Skin:    General: Skin is warm and dry.  Neurological:     General: No focal deficit present.     Mental Status: She is alert. Mental status is at baseline.  Psychiatric:        Mood and Affect: Mood normal.        Behavior: Behavior normal.      ED Treatments / Results  Labs (all labs ordered are listed, but only abnormal results are displayed) Labs Reviewed  URINALYSIS, ROUTINE W REFLEX MICROSCOPIC - Abnormal; Notable for the following components:      Result Value   Color, Urine STRAW (*)    All other components within normal limits  BASIC METABOLIC PANEL - Abnormal; Notable for the following components:   CO2 21 (*)    Calcium 7.4 (*)    All other components within normal limits  CBC WITH DIFFERENTIAL/PLATELET - Abnormal; Notable for the following components:   RBC 3.78 (*)    MCV 103.2 (*)    All other components within normal limits  URINE CULTURE    EKG None  Radiology No results found.  Procedures Procedures (including critical care  time)  Medications Ordered in ED Medications  promethazine (PHENERGAN) injection 25 mg (25 mg Intravenous Given 03/16/19 1651)  sodium chloride 0.9 % bolus 500 mL (0 mLs Intravenous Stopped 03/16/19 1829)  HYDROmorphone (DILAUDID) injection 1 mg (1 mg Intravenous Given 03/16/19 1648)  dexamethasone (DECADRON) injection 10 mg (10 mg Intravenous Given 03/16/19 1649)  HYDROmorphone (DILAUDID) injection 1 mg (1 mg Intravenous Given 03/16/19 1816)     Initial Impression / Assessment and Plan / ED Course  I have reviewed the triage vital signs and the nursing notes.  Pertinent labs & imaging results that were available during my care of the patient were reviewed by me and considered in my medical decision making (see chart for details).  Clinical Course as of Mar 16 132  Fri Mar 16, 2019  1640 Chart review shows that patient has been seen multiple times for previous issue, she has tolerated Dilaudid, Phenergan, steroids, fluids, and IV Robaxin.   [EH]  1727 Patient reevaluated, she says that her pain is unchanged.  She is requesting muscle relaxer.   [EH]  1194 Went to reevaluate patient, she appears more comfortable.  She says that she feels like if she gets another dose of Dilaudid that she would be able to try and walk.  She says that she does not feel like she can walk at this time.  She wishes for Dilaudid instead of ketamine.  She wants to be "out of here before shift change."  She requests that I start getting her papers ready.     [EH]  1829 Was informed by RN that after second dose of Dilaudid patient was able to get up and ambulate to the bathroom without difficulties.  Will observe, anticipate discharge.  UA is not yet obtained.   [EH]    Clinical Course User Index [EH] Lorin Glass, PA-C      Patient presents today for evaluation of left-sided leg pain.  She has a longstanding history of sciatica.  Chart review shows that she has had 11 ED visits in the past 6 months  requiring 0 admissions.  Multiple of these visits are for sciatic related pain.  She states that this feels consistent with her usual sciatica and is not concerning to her.  She does state that occasionally she has urinated on herself when her pain is severe, however this is intermittent and has been brought to the attention of her physician as an outpatient.  Do not suspect cauda equina based on intermittent nature of symptoms, her ability to heel and toe walk without difficulty after pain meds.  She was treated in the emergency room with Dilaudid and Phenergan.  Went to attempt ketamine, however patient refused electing for repeat Dilaudid.  She does not have any significant hematologic or electrolyte derangements.  She is afebrile and generally well-appearing.  UA without evidence of acute abnormalities.  After her second dose of Dilaudid she requested her discharge papers stating that she did not want to stay through shift change.  Quested to leave before her urine resulted.  Return precautions were discussed with patient who states their understanding.  At the time of discharge patient denied any unaddressed complaints or concerns.  Patient is agreeable for discharge home.   Final Clinical Impressions(s) / ED Diagnoses   Final diagnoses:  Left sided sciatica    ED Discharge Orders    None  Lorin Glass, PA-C 03/17/19 0136    Drenda Freeze, MD 03/21/19 [phone]

## 2019-03-16 NOTE — Discharge Instructions (Signed)
Today you received medications that may make you sleepy or impair your ability to make decisions.  For the next 24 hours please do not drive, operate heavy machinery, care for a small child with out another adult present, or perform any activities that may cause harm to you or someone else if you were to fall asleep or be impaired.   You are being prescribed a medication which may make you sleepy. Please follow up of listed precautions for at least 24 hours after taking one dose.  

## 2019-03-16 NOTE — ED Notes (Signed)
Pt ambulated to the bathroom without assistance with no problem.

## 2019-03-16 NOTE — ED Notes (Signed)
Patient given discharge teaching and verbalized understanding. Patient ambulated out of ED with a steady gait. 

## 2019-03-16 NOTE — ED Triage Notes (Signed)
Per EMS-states she fell due to her left giving-history of sciatica-patient requested Ketamine in route for pain-100 mcg of Fentanyl in route-seen several times same issue/complaints

## 2019-03-16 NOTE — ED Notes (Signed)
Bed: WA08 Expected date:  Expected time:  Means of arrival:  Comments: EMS-fall/sciatica

## 2019-03-18 LAB — URINE CULTURE: Culture: 10000 — AB

## 2019-03-26 DIAGNOSIS — Z79899 Other long term (current) drug therapy: Secondary | ICD-10-CM | POA: Diagnosis not present

## 2019-03-26 DIAGNOSIS — Z131 Encounter for screening for diabetes mellitus: Secondary | ICD-10-CM | POA: Diagnosis not present

## 2019-03-26 DIAGNOSIS — Z0000 Encounter for general adult medical examination without abnormal findings: Secondary | ICD-10-CM | POA: Diagnosis not present

## 2019-03-26 DIAGNOSIS — R5383 Other fatigue: Secondary | ICD-10-CM | POA: Diagnosis not present

## 2019-03-26 DIAGNOSIS — Z1159 Encounter for screening for other viral diseases: Secondary | ICD-10-CM | POA: Diagnosis not present

## 2019-03-26 DIAGNOSIS — M129 Arthropathy, unspecified: Secondary | ICD-10-CM | POA: Diagnosis not present

## 2019-03-26 DIAGNOSIS — E559 Vitamin D deficiency, unspecified: Secondary | ICD-10-CM | POA: Diagnosis not present

## 2019-03-26 DIAGNOSIS — E7800 Pure hypercholesterolemia, unspecified: Secondary | ICD-10-CM | POA: Diagnosis not present

## 2019-03-27 DIAGNOSIS — M5442 Lumbago with sciatica, left side: Secondary | ICD-10-CM | POA: Diagnosis not present

## 2019-04-01 ENCOUNTER — Emergency Department (HOSPITAL_COMMUNITY)
Admission: EM | Admit: 2019-04-01 | Discharge: 2019-04-01 | Disposition: A | Discharge status: Home / Self Care | Payer: Medicare Other | Attending: Emergency Medicine | Admitting: Emergency Medicine

## 2019-04-01 ENCOUNTER — Emergency Department (HOSPITAL_COMMUNITY): Payer: Medicare Other

## 2019-04-01 ENCOUNTER — Other Ambulatory Visit: Payer: Self-pay

## 2019-04-01 ENCOUNTER — Encounter (HOSPITAL_COMMUNITY): Payer: Self-pay | Admitting: Emergency Medicine

## 2019-04-01 DIAGNOSIS — Z79899 Other long term (current) drug therapy: Secondary | ICD-10-CM | POA: Diagnosis not present

## 2019-04-01 DIAGNOSIS — R1032 Left lower quadrant pain: Secondary | ICD-10-CM

## 2019-04-01 DIAGNOSIS — F17210 Nicotine dependence, cigarettes, uncomplicated: Secondary | ICD-10-CM | POA: Diagnosis not present

## 2019-04-01 DIAGNOSIS — I959 Hypotension, unspecified: Secondary | ICD-10-CM | POA: Diagnosis not present

## 2019-04-01 DIAGNOSIS — M5442 Lumbago with sciatica, left side: Secondary | ICD-10-CM | POA: Insufficient documentation

## 2019-04-01 DIAGNOSIS — G8929 Other chronic pain: Secondary | ICD-10-CM | POA: Diagnosis not present

## 2019-04-01 DIAGNOSIS — M545 Low back pain: Secondary | ICD-10-CM | POA: Diagnosis present

## 2019-04-01 DIAGNOSIS — M25552 Pain in left hip: Secondary | ICD-10-CM | POA: Diagnosis not present

## 2019-04-01 DIAGNOSIS — W19XXXA Unspecified fall, initial encounter: Secondary | ICD-10-CM | POA: Diagnosis not present

## 2019-04-01 DIAGNOSIS — R1030 Lower abdominal pain, unspecified: Secondary | ICD-10-CM | POA: Diagnosis not present

## 2019-04-01 DIAGNOSIS — R52 Pain, unspecified: Secondary | ICD-10-CM | POA: Diagnosis not present

## 2019-04-01 LAB — URINALYSIS, ROUTINE W REFLEX MICROSCOPIC
Bilirubin Urine: NEGATIVE
Glucose, UA: NEGATIVE mg/dL
Hgb urine dipstick: NEGATIVE
Ketones, ur: NEGATIVE mg/dL
Leukocytes,Ua: NEGATIVE
Nitrite: NEGATIVE
Protein, ur: NEGATIVE mg/dL
Specific Gravity, Urine: 1.005 (ref 1.005–1.030)
pH: 7 (ref 5.0–8.0)

## 2019-04-01 LAB — CBC WITH DIFFERENTIAL/PLATELET
Abs Immature Granulocytes: 0.02 10*3/uL (ref 0.00–0.07)
Basophils Absolute: 0 10*3/uL (ref 0.0–0.1)
Basophils Relative: 1 %
Eosinophils Absolute: 0.2 10*3/uL (ref 0.0–0.5)
Eosinophils Relative: 2 %
HCT: 40.4 % (ref 36.0–46.0)
Hemoglobin: 13.6 g/dL (ref 12.0–15.0)
Immature Granulocytes: 0 %
Lymphocytes Relative: 33 %
Lymphs Abs: 2.9 10*3/uL (ref 0.7–4.0)
MCH: 34.2 pg — ABNORMAL HIGH (ref 26.0–34.0)
MCHC: 33.7 g/dL (ref 30.0–36.0)
MCV: 101.5 fL — ABNORMAL HIGH (ref 80.0–100.0)
Monocytes Absolute: 0.6 10*3/uL (ref 0.1–1.0)
Monocytes Relative: 7 %
Neutro Abs: 5 10*3/uL (ref 1.7–7.7)
Neutrophils Relative %: 57 %
Platelets: 185 10*3/uL (ref 150–400)
RBC: 3.98 MIL/uL (ref 3.87–5.11)
RDW: 11.9 % (ref 11.5–15.5)
WBC: 8.8 10*3/uL (ref 4.0–10.5)
nRBC: 0 % (ref 0.0–0.2)

## 2019-04-01 LAB — BASIC METABOLIC PANEL
Anion gap: 7 (ref 5–15)
BUN: 16 mg/dL (ref 6–20)
CO2: 25 mmol/L (ref 22–32)
Calcium: 8.1 mg/dL — ABNORMAL LOW (ref 8.9–10.3)
Chloride: 106 mmol/L (ref 98–111)
Creatinine, Ser: 0.57 mg/dL (ref 0.44–1.00)
GFR calc Af Amer: >60 mL/min (ref >60)
GFR calc non Af Amer: >60 mL/min (ref >60)
Glucose, Bld: 84 mg/dL (ref 70–99)
Potassium: 3.8 mmol/L (ref 3.5–5.1)
Sodium: 138 mmol/L (ref 135–145)

## 2019-04-01 MED ORDER — DEXAMETHASONE SODIUM PHOSPHATE 10 MG/ML IJ SOLN
10.0000 mg | Freq: Once | INTRAMUSCULAR | Status: AC
  Administered 2019-04-01: 10 mg via INTRAVENOUS
  Filled 2019-04-01: qty 1

## 2019-04-01 MED ORDER — HYDROMORPHONE HCL 1 MG/ML IJ SOLN
1.0000 mg | Freq: Once | INTRAMUSCULAR | Status: AC
  Administered 2019-04-01: 22:00:00 1 mg via INTRAVENOUS
  Filled 2019-04-01: qty 1

## 2019-04-01 MED ORDER — HYDROMORPHONE HCL 1 MG/ML IJ SOLN
1.0000 mg | Freq: Once | INTRAMUSCULAR | Status: AC
  Administered 2019-04-01: 20:00:00 1 mg via INTRAVENOUS
  Filled 2019-04-01: qty 1

## 2019-04-01 MED ORDER — METHOCARBAMOL 1000 MG/10ML IJ SOLN
1000.0000 mg | Freq: Once | INTRAVENOUS | Status: AC
  Administered 2019-04-01: 1000 mg via INTRAVENOUS
  Filled 2019-04-01: qty 10

## 2019-04-01 MED ORDER — LORAZEPAM 2 MG/ML IJ SOLN
1.0000 mg | Freq: Once | INTRAMUSCULAR | Status: AC
  Administered 2019-04-01: 22:00:00 1 mg via INTRAVENOUS
  Filled 2019-04-01: qty 1

## 2019-04-01 MED ORDER — PROMETHAZINE HCL 25 MG/ML IJ SOLN
25.0000 mg | Freq: Once | INTRAMUSCULAR | Status: AC
  Administered 2019-04-01: 20:00:00 25 mg via INTRAVENOUS
  Filled 2019-04-01: qty 1

## 2019-04-01 NOTE — ED Notes (Signed)
Patient ambulatory in room without difficulty.

## 2019-04-01 NOTE — ED Notes (Signed)
US at bedside

## 2019-04-01 NOTE — ED Notes (Signed)
Urine culture sent to the lab. 
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
## 2019-04-01 NOTE — ED Notes (Signed)
Per request, patient placed on purewick.

## 2019-04-01 NOTE — ED Triage Notes (Signed)
Per EMS, patient c/o left lower back pain worsening today. Hx sciatica.   20L AC 89mcg Fentanyl with EMS

## 2019-04-01 NOTE — ED Notes (Signed)
Patient transported to X-ray 

## 2019-04-01 NOTE — ED Notes (Signed)
Pt is requesting IV Ativan instead of a muscle relaxer

## 2019-04-01 NOTE — Discharge Instructions (Signed)
Follow up with Neurology for your pain. Take your home medications as prescribed.  Return to the ED for any new or worsening symptoms.

## 2019-04-12 ENCOUNTER — Ambulatory Visit (INDEPENDENT_AMBULATORY_CARE_PROVIDER_SITE_OTHER): Payer: Medicare Other | Admitting: Family Medicine

## 2019-04-12 ENCOUNTER — Encounter: Payer: Self-pay | Admitting: Family Medicine

## 2019-04-12 ENCOUNTER — Other Ambulatory Visit: Payer: Self-pay

## 2019-04-12 VITALS — BP 101/66 | HR 87 | Temp 99.1°F | Ht 67.0 in | Wt 140.0 lb

## 2019-04-12 DIAGNOSIS — M797 Fibromyalgia: Secondary | ICD-10-CM | POA: Diagnosis not present

## 2019-04-12 DIAGNOSIS — M5432 Sciatica, left side: Secondary | ICD-10-CM

## 2019-04-12 DIAGNOSIS — F3162 Bipolar disorder, current episode mixed, moderate: Secondary | ICD-10-CM | POA: Diagnosis not present

## 2019-04-12 NOTE — Patient Instructions (Signed)
° ° ° °  If you have lab work done today you will be contacted with your lab results within the next 2 weeks.  If you have not heard from us then please contact us. The fastest way to get your results is to register for My Chart. ° ° °IF you received an x-ray today, you will receive an invoice from [facility]. Please contact [facility] at [phone] with questions or concerns regarding your invoice.  ° °IF you received labwork today, you will receive an invoice from [facility]. Please contact [facility] at [phone] with questions or concerns regarding your invoice.  ° °Our billing staff will not be able to assist you with questions regarding bills from these companies. ° °You will be contacted with the lab results as soon as they are available. The fastest way to get your results is to activate your My Chart account. Instructions are located on the last page of this paperwork. If you have not heard from us regarding the results in 2 weeks, please contact this office. °  ° ° ° °

## 2019-04-12 NOTE — Progress Notes (Signed)
6/11/202011:34 AM  Gina [name] 12-03-1975, 43 y.o., female [id number]  Chief Complaint  Patient presents with  . Follow-up    bipolar disorder  . records    has gyn, filled form for consent to fax    HPI:   Patient is a 43 y.o. female with past medical history significant for bipolar, fibromyalgia, chronic back pain with L sciatica, smoker who presents today for followup  Last OV may 2020 - telemedicine She will be going to [facility] next week for immunology  Continues to struggle with her left buttock pain which radiates down her left leg, she reports pain will come suddenly, cause significant spasms, unable to bear weight, has caused falls, usually ends up in the ER. Most recent in May.  She reports currently under neurology care [facility] - doing injections, last one of not beneficial at all MRI 2019 did not provide diagnosis she will be asking for referral to another neurologist  Continues to see Dr Nancy Fetter for pain mgt Continue to see Dr Reece Levy   Fall Risk  04/12/2019 03/12/2019 04/14/2016  Falls in the past year? 0 0 Yes  Number falls in past yr: 0 0 -  Injury with Fall? 0 0 -     Depression screen [facility] 2/9 03/12/2019 04/14/2016  Decreased Interest 0 0  Down, Depressed, Hopeless 0 0  PHQ - 2 Score 0 0  Some encounter information is confidential and restricted. Go to Review Flowsheets activity to see all data.  Some recent data might be hidden    Allergies  Allergen Reactions  . Lamictal [Lamotrigine] Anaphylaxis and Other (See Comments)    STEVEN JOHNSON'S SYNDROME  . Morphine And Related Other (See Comments)    Makes pt "very mean"  . Nicotine Swelling and Palpitations    Other reaction(s): Respiratory Distress (ALLERGY/intolerance), Swelling (ALLERGY/intolerance), Tachycardia / Palpitations  (intolerance) Patches, lozenges Patches caused palpations lozenges throat swelled  . Zofran Anaphylaxis  . Chantix [Varenicline Tartrate] Other (See Comments)   Diaphoresis, sweating.  Night terrors.  "went crazy"  . Codeine Nausea And Vomiting  . Haldol [Haloperidol] Itching, Rash and Other (See Comments)    FLUSHING  . Ibuprofen Nausea And Vomiting  . Lidoderm [Lidocaine] Other (See Comments)    DOESN'T WORK FOR PATIENT  . Metaxalone Other (See Comments)    Pt does not remember reaction, REAL BAD REACTION  . Sulfa Antibiotics Diarrhea and Other (See Comments)    GI PAINS ALSO  . Sulfasalazine Diarrhea    GI PAINS  . Toradol [Ketorolac Tromethamine] Nausea And Vomiting  . Duloxetine Other (See Comments)    unk  . Gabapentin Nausea And Vomiting  . Methadone     seizures  . Pregabalin     unk  . Zolpidem     Mental status changes  . Amoxicillin Rash    Has patient had a PCN reaction causing immediate rash, facial/tongue/throat swelling, SOB or lightheadedness with hypotension: No Has patient had a PCN reaction causing severe rash involving mucus membranes or skin necrosis: No Has patient had a PCN reaction that required hospitalization: No Has patient had a PCN reaction occurring within the last 10 years: No If all of the above answers are "NO", then may proceed with Cephalosporin use.   . Aripiprazole Anxiety    hallucinations  . Penicillins Rash    Other reaction(s): Urticaria / Hives (ALLERGY) Has patient had a PCN reaction causing immediate rash, facial/tongue/throat swelling, SOB or lightheadedness with hypotension: No Has  patient had a PCN reaction causing severe rash involving mucus membranes or skin necrosis: No Has patient had a PCN reaction that required hospitalization: No Has patient had a PCN reaction occurring within the last 10 years: No If all of the above answers are "NO", then may proceed with Cephalosporin use.    Prior to Admission medications   Medication Sig Start Date End Date Taking? Authorizing Provider  clonazePAM (KLONOPIN) 0.5 MG tablet Take 0.5 mg by mouth 2 (two) times daily.  08/30/18  Yes [provider]  cyclobenzaprine (FLEXERIL) 10 MG tablet Take 10 mg by mouth 3 (three) times daily as needed for muscle spasms.  01/19/19  Yes [provider]  escitalopram (LEXAPRO) 20 MG tablet Take 30 mg by mouth at bedtime. 12/20/18  Yes [provider]  lurasidone (LATUDA) 80 MG TABS tablet Take 80 mg by mouth at bedtime.    Yes [provider]  naloxegol oxalate (MOVANTIK) 25 MG TABS tablet Take 25 mg by mouth daily.   Yes [provider]  oxyCODONE-acetaminophen (PERCOCET) 7.5-325 MG tablet Take 1 tablet by mouth 4 (four) times daily.   Yes [provider]  promethazine (PHENERGAN) 25 MG tablet Take 1 tablet (25 mg total) by mouth every 6 (six) hours as needed for nausea or vomiting. 01/14/19  Yes Street, [place], PA-C  valACYclovir (VALTREX) 1000 MG tablet TK 2 TS PO BID 03/08/19  Yes [provider]  diphenhydrAMINE (BENADRYL) 25 MG tablet Take 2 tablets (50 mg total) by mouth every 6 (six) hours as needed for allergies. 02/11/19   Jola Schmidt, MD  famotidine (PEPCID) 20 MG tablet Take 1 tablet (20 mg total) by mouth 2 (two) times daily. 02/11/19   Jola Schmidt, MD  hydrOXYzine (ATARAX/VISTARIL) 25 MG tablet Take 1 tablet (25 mg total) by mouth every 6 (six) hours. 02/13/19   Varney Biles, MD  REXULTI 1 MG TABS Take 1 tablet by mouth daily.  01/19/19   [provider]    Past Medical History:  Diagnosis Date  . ABDOMINAL WALL HERNIA 02/27/2010  . Abnormal Pap smear   . ALLERGIC RHINITIS 06/02/2007  . ANOREXIA, CHRONIC 08/07/2008  . ANXIETY 09/15/2010  . Arthritis   . BACK PAIN, THORACIC REGION 07/07/2007  . Bipolar disorder ([place])   . CERVICAL RADICULOPATHY 12/11/2008  . Condyloma acuminatum 04/23/2009  . CONSTIPATION 09/15/2010  . DYSPHAGIA UNSPECIFIED 09/09/2009  . 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 Eating disorder   . ENDOMETRIOSIS 12/16/2009  . FATIGUE 06/11/2010  . Fibromyalgia   . GERD 10/23/2009  . Heart palpitations   .  HERPES SIMPLEX INFECTION 06/11/2010  . LENTIGO 04/23/2009  . LUNG NODULE 06/17/2010  . Narcotic abuse, continuous ([place])   . Ovarian cyst   . Palpitations 10/23/2009  . Panic disorder   . Rheumatoid arthritis(714.0)   . Stricture and stenosis of esophagus 05/13/2010  . TOBACCO ABUSE 08/07/2008  . Urinary tract infection   . UTI (urinary tract infection)     Past Surgical History:  Procedure Laterality Date  . ABDOMINAL SURGERY    . ABDOMINAL WALL MESH  REMOVAL    . ABLATION ON ENDOMETRIOSIS    . CESAREAN SECTION     x2   . DILATION AND CURETTAGE OF UTERUS     x1   . ENDOMETRIAL ABLATION    . esophageal dilatation x 4    . GUM SURGERY    . HERNIA REPAIR     X3  . PARTIAL HYSTERECTOMY  Social History   Tobacco Use  . Smoking status: Current Every Day Smoker    Packs/day: 1.00    Types: Cigarettes  . Smokeless tobacco: Never Used  Substance Use Topics  . Alcohol use: No    Family History  Problem Relation Age of Onset  . Depression Mother   . Arthritis Mother   . Hyperlipidemia Father   . Hypertension Father     ROS Per hpi  OBJECTIVE:  Today's Vitals   04/12/19 1110  BP: 101/66  Pulse: 87  Temp: 99.1 F (37.3 C)  TempSrc: Oral  SpO2: 95%  Weight: 140 lb (63.5 kg)  Height: 5\' 7"  (1.702 m)   Body mass index is 21.93 kg/m.   Physical Exam Vitals signs and nursing note reviewed.  Constitutional:      Appearance: She is well-developed.  HENT:     Head: Normocephalic and atraumatic.  Eyes:     General: No scleral icterus.    Conjunctiva/sclera: Conjunctivae normal.     Pupils: Pupils are equal, round, and reactive to light.  Neck:     Musculoskeletal: Neck supple.  Pulmonary:     Effort: Pulmonary effort is normal.  Skin:    General: Skin is warm and dry.  Neurological:     Mental Status: She is alert and oriented to person, place, and time.     ASSESSMENT and PLAN  1. Sciatica of left side 2. Fibromyalgia 3. Bipolar 1 disorder,  mixed, moderate ([place])  Patient under specialist care. She is to call if indeed needs referral for another neurologist.   Return in about 3 months (around 07/13/2019).    Rutherford Guys, MD Primary Care at [place] [place] [place], [place] [address] Ph.  [phone] Fax [phone]

## 2019-04-13 ENCOUNTER — Emergency Department (HOSPITAL_COMMUNITY)
Admission: EM | Admit: 2019-04-13 | Discharge: 2019-04-13 | Disposition: A | Discharge status: Home / Self Care | Payer: Medicare Other | Attending: Emergency Medicine | Admitting: Emergency Medicine

## 2019-04-13 ENCOUNTER — Encounter (HOSPITAL_COMMUNITY): Payer: Self-pay | Admitting: *Deleted

## 2019-04-13 ENCOUNTER — Other Ambulatory Visit: Payer: Self-pay

## 2019-04-13 DIAGNOSIS — F17210 Nicotine dependence, cigarettes, uncomplicated: Secondary | ICD-10-CM | POA: Diagnosis not present

## 2019-04-13 DIAGNOSIS — M5432 Sciatica, left side: Secondary | ICD-10-CM

## 2019-04-13 DIAGNOSIS — M549 Dorsalgia, unspecified: Secondary | ICD-10-CM | POA: Diagnosis present

## 2019-04-13 DIAGNOSIS — R000 Tachycardia, unspecified: Secondary | ICD-10-CM | POA: Diagnosis not present

## 2019-04-13 DIAGNOSIS — M069 Rheumatoid arthritis, unspecified: Secondary | ICD-10-CM | POA: Diagnosis not present

## 2019-04-13 DIAGNOSIS — Z79899 Other long term (current) drug therapy: Secondary | ICD-10-CM | POA: Insufficient documentation

## 2019-04-13 DIAGNOSIS — M5442 Lumbago with sciatica, left side: Secondary | ICD-10-CM | POA: Insufficient documentation

## 2019-04-13 DIAGNOSIS — G8929 Other chronic pain: Secondary | ICD-10-CM | POA: Diagnosis not present

## 2019-04-13 DIAGNOSIS — R1111 Vomiting without nausea: Secondary | ICD-10-CM | POA: Diagnosis not present

## 2019-04-13 DIAGNOSIS — I959 Hypotension, unspecified: Secondary | ICD-10-CM | POA: Diagnosis not present

## 2019-04-13 DIAGNOSIS — M5489 Other dorsalgia: Secondary | ICD-10-CM | POA: Diagnosis not present

## 2019-04-13 MED ORDER — HYDROMORPHONE HCL 1 MG/ML IJ SOLN
1.0000 mg | Freq: Once | INTRAMUSCULAR | Status: AC
  Administered 2019-04-13: 1 mg via INTRAVENOUS
  Filled 2019-04-13: qty 1

## 2019-04-13 MED ORDER — LORAZEPAM 2 MG/ML IJ SOLN
1.0000 mg | Freq: Once | INTRAMUSCULAR | Status: AC
  Administered 2019-04-13: 1 mg via INTRAVENOUS
  Filled 2019-04-13: qty 1

## 2019-04-13 MED ORDER — DEXAMETHASONE SODIUM PHOSPHATE 10 MG/ML IJ SOLN
10.0000 mg | Freq: Once | INTRAMUSCULAR | Status: AC
  Administered 2019-04-13: 10 mg via INTRAVENOUS
  Filled 2019-04-13: qty 1

## 2019-04-13 MED ORDER — PROMETHAZINE HCL 25 MG/ML IJ SOLN
12.5000 mg | Freq: Once | INTRAMUSCULAR | Status: AC
  Administered 2019-04-13: 12.5 mg via INTRAVENOUS
  Filled 2019-04-13: qty 1

## 2019-04-13 MED ORDER — HYDROMORPHONE HCL 1 MG/ML IJ SOLN
1.0000 mg | Freq: Once | INTRAMUSCULAR | Status: AC
  Administered 2019-04-13: 1 mg via INTRAVENOUS
  Filled 2019-04-13 (×2): qty 1

## 2019-04-13 NOTE — ED Provider Notes (Signed)
[facility] DEPT Provider Note   CSN: [id number] Arrival date & time: 04/13/19  1954     History   Chief Complaint Chief Complaint  Patient presents with  . Back Pain    HPI Gina [name] is a 43 y.o. female.     HPI   43yF with back pain. Hx of sciatica and began having increasing pain today. Says she over did it helping parents. Pain is in L lower back with radiation into LLE. No acute trauma. Seen in the ED with some frequency for this complaint over the years and as most recently about two weeks ago.   Past Medical History:  Diagnosis Date  . ABDOMINAL WALL HERNIA 02/27/2010  . Abnormal Pap smear   . ALLERGIC RHINITIS 06/02/2007  . ANOREXIA, CHRONIC 08/07/2008  . ANXIETY 09/15/2010  . Arthritis   . BACK PAIN, THORACIC REGION 07/07/2007  . Bipolar disorder ([place])   . CERVICAL RADICULOPATHY 12/11/2008  . Condyloma acuminatum 04/23/2009  . CONSTIPATION 09/15/2010  . DYSPHAGIA UNSPECIFIED 09/09/2009  . 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 Eating disorder   . ENDOMETRIOSIS 12/16/2009  . FATIGUE 06/11/2010  . Fibromyalgia   . GERD 10/23/2009  . Heart palpitations   . HERPES SIMPLEX INFECTION 06/11/2010  . LENTIGO 04/23/2009  . LUNG NODULE 06/17/2010  . Narcotic abuse, continuous ([place])   . Ovarian cyst   . Palpitations 10/23/2009  . Panic disorder   . Rheumatoid arthritis(714.0)   . Stricture and stenosis of esophagus 05/13/2010  . TOBACCO ABUSE 08/07/2008  . Urinary tract infection   . UTI (urinary tract infection)     Patient Active Problem List   Diagnosis Date Noted  . Bipolar affective disorder, current episode mixed ([place]) 01/02/2019  . Arthritis 01/02/2019  . Cigarette nicotine dependence without complication 39/76/7341  . Bipolar 1 disorder, mixed, moderate ([facility]) 07/15/2015    Class: Chronic  . 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 Sciatica 01/09/2015  . Reactive hypoglycemia 12/17/2013  . Musculoskeletal  malfunction arising from mental factors 04/04/2013  . Incisional hernia 11/15/2012  . Other postprocedural status(V45.89) 11/15/2012  . Abdominal pain 11/11/2012  . Chronic pain syndrome 11/11/2012  . Disorder of sacrum 11/11/2012  . Heartburn 10/04/2011  . DYSPHAGIA 10/04/2011  . Depression with anxiety 09/15/2010  . CONSTIPATION 09/15/2010  . LUNG NODULE 06/17/2010  . Herpes simplex virus (HSV) infection 06/11/2010  . FATIGUE 06/11/2010  . STRICTURE AND STENOSIS OF ESOPHAGUS 05/13/2010  . ABDOMINAL WALL HERNIA 02/27/2010  . ENDOMETRIOSIS 12/16/2009  . GERD 10/23/2009  . PALPITATIONS 10/23/2009  . 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 CONDYLOMA ACUMINATUM 04/23/2009  . LENTIGO 04/23/2009  . DENTAL PAIN 03/19/2009  . CERVICAL RADICULOPATHY 12/11/2008  . 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 TOBACCO ABUSE 08/07/2008  . BACK PAIN, THORACIC REGION 07/07/2007  . ALLERGIC RHINITIS 06/02/2007    Past Surgical History:  Procedure Laterality Date  . ABDOMINAL SURGERY    . ABDOMINAL WALL MESH  REMOVAL    . ABLATION ON ENDOMETRIOSIS    . CESAREAN SECTION     x2   . DILATION AND CURETTAGE OF UTERUS     x1   . ENDOMETRIAL ABLATION    . esophageal dilatation x 4    . GUM SURGERY    . HERNIA REPAIR     X3  . PARTIAL HYSTERECTOMY       OB History    Gravida  3  Para  2   Term  2   Preterm  0   AB  1   Living  2     SAB  1   TAB  0   Ectopic  0   Multiple  0   Live Births  1            Home Medications    Prior to Admission medications   Medication Sig Start Date End Date Taking? Authorizing Provider  clonazePAM (KLONOPIN) 0.5 MG tablet Take 0.5 mg by mouth 2 (two) times daily.  08/30/18   [provider]  cyclobenzaprine (FLEXERIL) 10 MG tablet Take 10 mg by mouth 3 (three) times daily as needed for muscle spasms.  01/19/19   [provider]  diphenhydrAMINE (BENADRYL) 25 MG  tablet Take 2 tablets (50 mg total) by mouth every 6 (six) hours as needed for allergies. 02/11/19   Jola Schmidt, MD  escitalopram (LEXAPRO) 20 MG tablet Take 30 mg by mouth at bedtime. 12/20/18   [provider]  famotidine (PEPCID) 20 MG tablet Take 1 tablet (20 mg total) by mouth 2 (two) times daily. 02/11/19   Jola Schmidt, MD  hydrOXYzine (ATARAX/VISTARIL) 25 MG tablet Take 1 tablet (25 mg total) by mouth every 6 (six) hours. 02/13/19   Varney Biles, MD  lurasidone (LATUDA) 80 MG TABS tablet Take 80 mg by mouth at bedtime.     [provider]  naloxegol oxalate (MOVANTIK) 25 MG TABS tablet Take 25 mg by mouth daily.    [provider]  oxyCODONE-acetaminophen (PERCOCET) 7.5-325 MG tablet Take 1 tablet by mouth 4 (four) times daily.    [provider]  promethazine (PHENERGAN) 25 MG tablet Take 1 tablet (25 mg total) by mouth every 6 (six) hours as needed for nausea or vomiting. 01/14/19   Street, [place], PA-C  REXULTI 1 MG TABS Take 1 tablet by mouth daily.  01/19/19   [provider]  valACYclovir (VALTREX) 1000 MG tablet TK 2 TS PO BID 03/08/19   [provider]    Family History Family History  Problem Relation Age of Onset  . Depression Mother   . Arthritis Mother   . Hyperlipidemia Father   . Hypertension Father     Social History Social History   Tobacco Use  . Smoking status: Current Every Day Smoker    Packs/day: 1.00    Types: Cigarettes  . Smokeless tobacco: Never Used  Substance Use Topics  . Alcohol use: No  . Drug use: No     Allergies   Lamictal [lamotrigine], Morphine and related, Nicotine, Zofran, Chantix [varenicline tartrate], Codeine, Haldol [haloperidol], Ibuprofen, Lidoderm [lidocaine], Metaxalone, Sulfa antibiotics, Sulfasalazine, Toradol [ketorolac tromethamine], Duloxetine, Gabapentin, Methadone, Pregabalin, Zolpidem, Amoxicillin, Aripiprazole, and Penicillins   Review of Systems Review of  Systems  All systems reviewed and negative, other than as noted in HPI.  Physical Exam Updated Vital Signs BP 100/65 (BP Location: Left Arm)   Pulse (!) 104   Temp 99.5 F (37.5 C) (Oral)   Resp 18   Ht 5\' 7"  (1.702 m)   Wt 65.8 kg   LMP 01/01/2004   SpO2 98%   BMI 22.71 kg/m   Physical Exam Vitals signs and nursing note reviewed.  Constitutional:      Appearance: She is well-developed.     Comments: Laying in bed on R side. TTP L lower back into L buttock. + straight leg test.   HENT:  Head: Normocephalic and atraumatic.  Eyes:     General:        Right eye: No discharge.        Left eye: No discharge.     Conjunctiva/sclera: Conjunctivae normal.  Neck:     Musculoskeletal: Neck supple.  Cardiovascular:     Rate and Rhythm: Normal rate and regular rhythm.     Heart sounds: Normal heart sounds. No murmur. No friction rub. No gallop.   Pulmonary:     Effort: Pulmonary effort is normal. No respiratory distress.     Breath sounds: Normal breath sounds.  Abdominal:     General: There is no distension.     Palpations: Abdomen is soft.     Tenderness: There is no abdominal tenderness.  Musculoskeletal:        General: No tenderness.  Skin:    General: Skin is warm and dry.  Neurological:     Mental Status: She is alert.  Psychiatric:        Behavior: Behavior normal.        Thought Content: Thought content normal.      ED Treatments / Results  Labs (all labs ordered are listed, but only abnormal results are displayed) Labs Reviewed - No data to display  EKG    Radiology No results found.  Procedures Procedures (including critical care time)  Medications Ordered in ED Medications  HYDROmorphone (DILAUDID) injection 1 mg (has no administration in time range)  promethazine (PHENERGAN) injection 12.5 mg (has no administration in time range)  LORazepam (ATIVAN) injection 1 mg (1 mg Intravenous Given 04/13/19 2109)  dexamethasone (DECADRON) injection 10  mg (10 mg Intravenous Given 04/13/19 2111)     Initial Impression / Assessment and Plan / ED Course  I have reviewed the triage vital signs and the nursing notes.  Pertinent labs & imaging results that were available during my care of the patient were reviewed by me and considered in my medical decision making (see chart for details).    43yf with lower back pain. Some concern for drug seeking. Does seem uncomfortable though. Meds ordered. Will reassess. Denies new symptoms from what she normally experiences with flairs.   Final Clinical Impressions(s) / ED Diagnoses   Final diagnoses:  Sciatica of left side  Other chronic pain    ED Discharge Orders    None       Virgel Manifold, MD 04/14/19 1907

## 2019-04-13 NOTE — ED Triage Notes (Signed)
Pt bib EMS and presents with lower back pain radiating to the left leg. Hx Sciatica Pt believes she was moving around too much and caused her pain to come back. EMS gave pt 246mcg Fentanyl without much relief.  Pt a/o x 4.

## 2019-04-13 NOTE — ED Notes (Signed)
Bed: RW41 Expected date:  Expected time:  Means of arrival:  Comments: EMS 43 yo female lower back pain radiating to left leg /pain meds

## 2019-04-18 ENCOUNTER — Encounter (HOSPITAL_COMMUNITY): Payer: Self-pay

## 2019-04-18 ENCOUNTER — Other Ambulatory Visit: Payer: Self-pay

## 2019-04-18 ENCOUNTER — Emergency Department (HOSPITAL_COMMUNITY)
Admission: EM | Admit: 2019-04-18 | Discharge: 2019-04-18 | Disposition: A | Discharge status: Home / Self Care | Payer: Medicare Other | Attending: Emergency Medicine | Admitting: Emergency Medicine

## 2019-04-18 DIAGNOSIS — Z8659 Personal history of other mental and behavioral disorders: Secondary | ICD-10-CM | POA: Insufficient documentation

## 2019-04-18 DIAGNOSIS — Z79899 Other long term (current) drug therapy: Secondary | ICD-10-CM | POA: Insufficient documentation

## 2019-04-18 DIAGNOSIS — M25552 Pain in left hip: Secondary | ICD-10-CM

## 2019-04-18 DIAGNOSIS — M79605 Pain in left leg: Secondary | ICD-10-CM | POA: Diagnosis not present

## 2019-04-18 DIAGNOSIS — L500 Allergic urticaria: Secondary | ICD-10-CM | POA: Insufficient documentation

## 2019-04-18 DIAGNOSIS — G2401 Drug induced subacute dyskinesia: Secondary | ICD-10-CM | POA: Diagnosis not present

## 2019-04-18 DIAGNOSIS — F17210 Nicotine dependence, cigarettes, uncomplicated: Secondary | ICD-10-CM | POA: Diagnosis not present

## 2019-04-18 DIAGNOSIS — M797 Fibromyalgia: Secondary | ICD-10-CM | POA: Diagnosis not present

## 2019-04-18 DIAGNOSIS — T50905A Adverse effect of unspecified drugs, medicaments and biological substances, initial encounter: Secondary | ICD-10-CM | POA: Insufficient documentation

## 2019-04-18 DIAGNOSIS — W19XXXA Unspecified fall, initial encounter: Secondary | ICD-10-CM | POA: Diagnosis not present

## 2019-04-18 DIAGNOSIS — M5430 Sciatica, unspecified side: Secondary | ICD-10-CM | POA: Diagnosis not present

## 2019-04-18 MED ORDER — HYDROMORPHONE HCL 1 MG/ML IJ SOLN
1.0000 mg | Freq: Once | INTRAMUSCULAR | Status: AC
  Administered 2019-04-18: 16:00:00 1 mg via INTRAVENOUS
  Filled 2019-04-18: qty 1

## 2019-04-18 MED ORDER — DEXAMETHASONE SODIUM PHOSPHATE 10 MG/ML IJ SOLN
10.0000 mg | Freq: Once | INTRAMUSCULAR | Status: AC
  Administered 2019-04-18: 17:00:00 10 mg via INTRAVENOUS
  Filled 2019-04-18: qty 1

## 2019-04-18 MED ORDER — PROMETHAZINE HCL 25 MG/ML IJ SOLN
12.5000 mg | Freq: Once | INTRAMUSCULAR | Status: AC
  Administered 2019-04-18: 12.5 mg via INTRAVENOUS
  Filled 2019-04-18: qty 1

## 2019-04-18 MED ORDER — LORAZEPAM 2 MG/ML IJ SOLN
1.0000 mg | Freq: Once | INTRAMUSCULAR | Status: AC
  Administered 2019-04-18: 1 mg via INTRAVENOUS
  Filled 2019-04-18: qty 1

## 2019-04-18 MED ORDER — LORAZEPAM 2 MG/ML IJ SOLN
0.5000 mg | Freq: Once | INTRAMUSCULAR | Status: AC
  Administered 2019-04-18: 17:00:00 0.5 mg via INTRAVENOUS
  Filled 2019-04-18: qty 1

## 2019-04-18 NOTE — Discharge Instructions (Signed)
Follow up with your doctor.   Return to ER for new or worsening symptoms, any additional concerns.

## 2019-04-18 NOTE — ED Provider Notes (Signed)
[facility] DEPT Provider Note   CSN: [id number] Arrival date & time: 04/18/19  1459     History   Chief Complaint Chief Complaint  Patient presents with  . Hip Pain    HPI Gina [name] is a 43 y.o. female.     The history is provided by the patient and medical records. No language interpreter was used.  Hip Pain   Gina [name] is a 43 y.o. female with history as listed below who presents to the emergency department with a flare of her left-sided hip pain which she attributes to her sciatica.  Patient endorses being seen in the emergency department for same a few days ago.  She reports feeling much better after pain medications given in the emergency department and was discharged home feeling much better.  She states that she went to see a doctor at [facility] for this today, but was not hurting when she was there.  She believes it was the drive back that exacerbated her pain.  Of note, it appears that patient was actually seen by the pulmonologist with the [facility] system.  She denies any fevers.  No cough, shortness of breath, urinary symptoms.  No history of IV drug use.  Reports her pain today feels similar to her pain in the past.   Past Medical History:  Diagnosis Date  . ABDOMINAL WALL HERNIA 02/27/2010  . Abnormal Pap smear   . ALLERGIC RHINITIS 06/02/2007  . ANOREXIA, CHRONIC 08/07/2008  . ANXIETY 09/15/2010  . Arthritis   . BACK PAIN, THORACIC REGION 07/07/2007  . Bipolar disorder ([place])   . CERVICAL RADICULOPATHY 12/11/2008  . Condyloma acuminatum 04/23/2009  . CONSTIPATION 09/15/2010  . DYSPHAGIA UNSPECIFIED 09/09/2009  . 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 Eating disorder   . ENDOMETRIOSIS 12/16/2009  . FATIGUE 06/11/2010  . Fibromyalgia   . GERD 10/23/2009  . Heart palpitations   . HERPES SIMPLEX INFECTION 06/11/2010  . LENTIGO 04/23/2009  . LUNG NODULE 06/17/2010  . Narcotic abuse, continuous ([place])   . Ovarian cyst   . Palpitations  10/23/2009  . Panic disorder   . Rheumatoid arthritis(714.0)   . Stricture and stenosis of esophagus 05/13/2010  . TOBACCO ABUSE 08/07/2008  . Urinary tract infection   . UTI (urinary tract infection)     Patient Active Problem List   Diagnosis Date Noted  . Bipolar affective disorder, current episode mixed ([place]) 01/02/2019  . Arthritis 01/02/2019  . Cigarette nicotine dependence without complication 31/43/8887  . Bipolar 1 disorder, mixed, moderate ([facility]) 07/15/2015    Class: Chronic  . 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 Sciatica 01/09/2015  . Reactive hypoglycemia 12/17/2013  . Musculoskeletal malfunction arising from mental factors 04/04/2013  . Incisional hernia 11/15/2012  . Other postprocedural status(V45.89) 11/15/2012  . Abdominal pain 11/11/2012  . Chronic pain syndrome 11/11/2012  . Disorder of sacrum 11/11/2012  . Heartburn 10/04/2011  . DYSPHAGIA 10/04/2011  . Depression with anxiety 09/15/2010  . CONSTIPATION 09/15/2010  . LUNG NODULE 06/17/2010  . Herpes simplex virus (HSV) infection 06/11/2010  . FATIGUE 06/11/2010  . STRICTURE AND STENOSIS OF ESOPHAGUS 05/13/2010  . ABDOMINAL WALL HERNIA 02/27/2010  . ENDOMETRIOSIS 12/16/2009  . GERD 10/23/2009  . PALPITATIONS 10/23/2009  . 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 CONDYLOMA ACUMINATUM 04/23/2009  . LENTIGO 04/23/2009  . DENTAL PAIN 03/19/2009  . CERVICAL RADICULOPATHY 12/11/2008  . 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  TOBACCO ABUSE 08/07/2008  . BACK PAIN, THORACIC REGION 07/07/2007  . ALLERGIC RHINITIS 06/02/2007    Past Surgical History:  Procedure Laterality Date  . ABDOMINAL SURGERY    . ABDOMINAL WALL MESH  REMOVAL    . ABLATION ON ENDOMETRIOSIS    . CESAREAN SECTION     x2   . DILATION AND CURETTAGE OF UTERUS     x1   . ENDOMETRIAL ABLATION    . esophageal dilatation x 4    . GUM SURGERY    . HERNIA REPAIR     X3  .  PARTIAL HYSTERECTOMY       OB History    Gravida  3   Para  2   Term  2   Preterm  0   AB  1   Living  2     SAB  1   TAB  0   Ectopic  0   Multiple  0   Live Births  1            Home Medications    Prior to Admission medications   Medication Sig Start Date End Date Taking? Authorizing Provider  clonazePAM (KLONOPIN) 0.5 MG tablet Take 0.5 mg by mouth 2 (two) times daily.  08/30/18   [provider]  cyclobenzaprine (FLEXERIL) 10 MG tablet Take 10 mg by mouth 3 (three) times daily as needed for muscle spasms.  01/19/19   [provider]  diphenhydrAMINE (BENADRYL) 25 MG tablet Take 2 tablets (50 mg total) by mouth every 6 (six) hours as needed for allergies. 02/11/19   Jola Schmidt, MD  escitalopram (LEXAPRO) 20 MG tablet Take 30 mg by mouth at bedtime. 12/20/18   [provider]  famotidine (PEPCID) 20 MG tablet Take 1 tablet (20 mg total) by mouth 2 (two) times daily. 02/11/19   Jola Schmidt, MD  hydrOXYzine (ATARAX/VISTARIL) 25 MG tablet Take 1 tablet (25 mg total) by mouth every 6 (six) hours. 02/13/19   Varney Biles, MD  lurasidone (LATUDA) 80 MG TABS tablet Take 80 mg by mouth at bedtime.     [provider]  naloxegol oxalate (MOVANTIK) 25 MG TABS tablet Take 25 mg by mouth daily.    [provider]  oxyCODONE-acetaminophen (PERCOCET) 7.5-325 MG tablet Take 1 tablet by mouth 4 (four) times daily.    [provider]  promethazine (PHENERGAN) 25 MG tablet Take 1 tablet (25 mg total) by mouth every 6 (six) hours as needed for nausea or vomiting. 01/14/19   Street, [place], PA-C  REXULTI 1 MG TABS Take 1 tablet by mouth daily.  01/19/19   [provider]  valACYclovir (VALTREX) 1000 MG tablet Take 1,000 mg by mouth 2 (two) times daily.  03/08/19   [provider]    Family History Family History  Problem Relation Age of Onset  . Depression Mother   . Arthritis Mother   . Hyperlipidemia  Father   . Hypertension Father     Social History Social History   Tobacco Use  . Smoking status: Current Every Day Smoker    Packs/day: 1.00    Types: Cigarettes  . Smokeless tobacco: Never Used  Substance Use Topics  . Alcohol use: No  . Drug use: No     Allergies   Lamictal [lamotrigine], Morphine and related, Nicotine, Zofran, Chantix [varenicline tartrate], Codeine, Haldol [haloperidol], Ibuprofen, Lidoderm [lidocaine], Metaxalone, Sulfa antibiotics, Sulfasalazine, Toradol [ketorolac tromethamine], Duloxetine, Gabapentin, Ingrezza [valbenazine tosylate], Methadone, Pregabalin, Zolpidem, Amoxicillin,  Aripiprazole, and Penicillins   Review of Systems Review of Systems  Musculoskeletal: Positive for arthralgias and myalgias.  All other systems reviewed and are negative.    Physical Exam Updated Vital Signs BP 104/80   Pulse 82   Temp 99 F (37.2 C) (Oral)   Resp 14   Ht 5\' 7"  (1.702 m)   Wt 65.8 kg   LMP 01/01/2004   SpO2 97%   BMI 22.72 kg/m   Physical Exam Vitals signs and nursing note reviewed.  Constitutional:      General: She is not in acute distress.    Appearance: She is well-developed.  HENT:     Head: Normocephalic and atraumatic.  Neck:     Musculoskeletal: Neck supple.  Cardiovascular:     Rate and Rhythm: Normal rate and regular rhythm.     Heart sounds: Normal heart sounds. No murmur.  Pulmonary:     Effort: Pulmonary effort is normal. No respiratory distress.     Breath sounds: Normal breath sounds.  Abdominal:     General: There is no distension.     Palpations: Abdomen is soft.     Tenderness: There is no abdominal tenderness.  Musculoskeletal:     Comments: Diffuse tenderness to left hip and left lower back.  No overlying skin changes.  Full range of motion although with movement of the left hip does reproduce pain.  5/5 muscle strength to bilateral lower extremities.  Bilateral lower extremities are neurovascularly intact.  Skin:     General: Skin is warm and dry.  Neurological:     Mental Status: She is alert and oriented to person, place, and time.      ED Treatments / Results  Labs (all labs ordered are listed, but only abnormal results are displayed) Labs Reviewed - No data to display  EKG    Radiology No results found.  Procedures Procedures (including critical care time)  Medications Ordered in ED Medications  HYDROmorphone (DILAUDID) injection 1 mg (1 mg Intravenous Given 04/18/19 1606)  promethazine (PHENERGAN) injection 12.5 mg (12.5 mg Intravenous Given 04/18/19 1601)  LORazepam (ATIVAN) injection 1 mg (1 mg Intravenous Given 04/18/19 1603)  LORazepam (ATIVAN) injection 0.5 mg (0.5 mg Intravenous Given 04/18/19 1647)  dexamethasone (DECADRON) injection 10 mg (10 mg Intravenous Given 04/18/19 1645)     Initial Impression / Assessment and Plan / ED Course  I have reviewed the triage vital signs and the nursing notes.  Pertinent labs & imaging results that were available during my care of the patient were reviewed by me and considered in my medical decision making (see chart for details).       Gina [name] is a 43 y.o. female who presents to ED for left hip / back pain c/w her previous flares.   Patient demonstrates no lower extremity weakness, saddle anesthesia, bowel or bladder incontinence or neuro deficits. No concern for cauda equina. Seen in ED several times for same. Initially had temp of 100.3 -- repeat of 99.0 without any anti-pyretics. She has no infectious symptoms. No hx of IVDU. Doubt septic joint or epidural abscess. . Lower extremities are neurovascularly intact and patient is ambulating after pain control. I have reviewed return precautions, including the development of any of these signs or symptoms and the patient has voiced understanding. I reviewed symptomatic home care instructions and neurosurg/PCP follow-up if symptoms do not improve. Patient voiced understanding and  agreement with plan. All questions answered.   Final Clinical Impressions(s) /  ED Diagnoses   Final diagnoses:  Left hip pain    ED Discharge Orders    None       Michelle Wnek, Ozella Almond, PA-C 04/18/19 1708    Davonna Belling, MD 04/18/19 2157

## 2019-04-18 NOTE — ED Triage Notes (Signed)
Pt BIB EMS from home. Pt reports sciatic nerve pain in left lower back and hip . Pt was seen at [facility] for same. Pt reports taking oxycodone around 8am which did not help. Pt reports falling due to pain.   100/68 99 99%  22G LH

## 2019-04-26 DIAGNOSIS — M5442 Lumbago with sciatica, left side: Secondary | ICD-10-CM | POA: Diagnosis not present

## 2019-04-26 DIAGNOSIS — G8929 Other chronic pain: Secondary | ICD-10-CM | POA: Diagnosis not present

## 2019-04-27 DIAGNOSIS — M5442 Lumbago with sciatica, left side: Secondary | ICD-10-CM | POA: Diagnosis not present

## 2019-04-27 DIAGNOSIS — M4802 Spinal stenosis, cervical region: Secondary | ICD-10-CM | POA: Diagnosis not present

## 2019-04-27 DIAGNOSIS — M5432 Sciatica, left side: Secondary | ICD-10-CM | POA: Diagnosis not present

## 2019-04-27 DIAGNOSIS — M4807 Spinal stenosis, lumbosacral region: Secondary | ICD-10-CM | POA: Diagnosis not present

## 2019-04-28 ENCOUNTER — Encounter (HOSPITAL_COMMUNITY): Payer: Self-pay

## 2019-04-28 ENCOUNTER — Emergency Department (HOSPITAL_COMMUNITY)
Admission: EM | Admit: 2019-04-28 | Discharge: 2019-04-28 | Disposition: A | Discharge status: Home / Self Care | Payer: Medicare Other | Attending: Emergency Medicine | Admitting: Emergency Medicine

## 2019-04-28 DIAGNOSIS — F17210 Nicotine dependence, cigarettes, uncomplicated: Secondary | ICD-10-CM | POA: Insufficient documentation

## 2019-04-28 DIAGNOSIS — Z79899 Other long term (current) drug therapy: Secondary | ICD-10-CM | POA: Diagnosis not present

## 2019-04-28 DIAGNOSIS — M5442 Lumbago with sciatica, left side: Secondary | ICD-10-CM | POA: Diagnosis not present

## 2019-04-28 DIAGNOSIS — I959 Hypotension, unspecified: Secondary | ICD-10-CM | POA: Diagnosis not present

## 2019-04-28 DIAGNOSIS — M549 Dorsalgia, unspecified: Secondary | ICD-10-CM | POA: Diagnosis present

## 2019-04-28 DIAGNOSIS — M5489 Other dorsalgia: Secondary | ICD-10-CM | POA: Diagnosis not present

## 2019-04-28 DIAGNOSIS — M5432 Sciatica, left side: Secondary | ICD-10-CM | POA: Diagnosis not present

## 2019-04-28 HISTORY — DX: Disorder of kidney and ureter, unspecified: N28.9

## 2019-04-28 MED ORDER — PROMETHAZINE HCL 25 MG/ML IJ SOLN
25.0000 mg | Freq: Once | INTRAMUSCULAR | Status: AC
  Administered 2019-04-28: 25 mg via INTRAVENOUS
  Filled 2019-04-28: qty 1

## 2019-04-28 MED ORDER — DEXAMETHASONE SODIUM PHOSPHATE 10 MG/ML IJ SOLN
10.0000 mg | Freq: Once | INTRAMUSCULAR | Status: AC
  Administered 2019-04-28: 10 mg via INTRAVENOUS
  Filled 2019-04-28: qty 1

## 2019-04-28 MED ORDER — HYDROMORPHONE HCL 1 MG/ML IJ SOLN
1.0000 mg | Freq: Once | INTRAMUSCULAR | Status: AC
  Administered 2019-04-28: 1 mg via INTRAVENOUS
  Filled 2019-04-28: qty 1

## 2019-04-28 MED ORDER — LORAZEPAM 2 MG/ML IJ SOLN
1.0000 mg | Freq: Once | INTRAMUSCULAR | Status: AC
  Administered 2019-04-28: 1 mg via INTRAVENOUS
  Filled 2019-04-28: qty 1

## 2019-04-28 MED ORDER — KETAMINE HCL 50 MG/5ML IJ SOSY
0.3000 mg/kg | PREFILLED_SYRINGE | Freq: Once | INTRAMUSCULAR | Status: AC
  Administered 2019-04-28: 20 mg via INTRAVENOUS
  Filled 2019-04-28: qty 5

## 2019-04-28 NOTE — ED Notes (Signed)
Bed: WA08 Expected date:  Expected time:  Means of arrival:  Comments: Back pain

## 2019-04-28 NOTE — ED Triage Notes (Signed)
Arrived by EMS with c/o chronic left lower back pain. Patient received a total of 200 mcg fentanyl IV and 300 Ml NS en route to this facility. A&O X4

## 2019-04-28 NOTE — ED Provider Notes (Signed)
[facility] DEPT Provider Note   CSN: [id number] Arrival date & time: 04/28/19  1559    History   Chief Complaint Chief Complaint  Patient presents with  . Back Pain    HPI Gina [name] is a 43 y.o. female.     HPI Patient presents with concern of back pain. Patient notes a history of sciatica, with exacerbations This is been going on for years, with episodes that occur both with and without clear precipitant. She notes that over the past day she has had increasing pain, severe, radiating from her left hip down the left leg, without loss of sensation distally. She does have some urge incontinence, but states that she knows that she needs to go to the bathroom before she goes to the bathroom, but sometimes loses control prior to getting there. No fever, no chest pain, no dyspnea. She continues to take her home medication regimen, which includes both muscle relaxants and oxycodone without relief. She saw a neurologist for a second opinion yesterday at [facility], saw her neurologist at [facility] within the past week, and a scheduled for MRI this week, surgical consultation next week.  Past Medical History:  Diagnosis Date  . ABDOMINAL WALL HERNIA 02/27/2010  . Abnormal Pap smear   . ALLERGIC RHINITIS 06/02/2007  . ANOREXIA, CHRONIC 08/07/2008  . ANXIETY 09/15/2010  . Arthritis   . BACK PAIN, THORACIC REGION 07/07/2007  . Bipolar disorder ([place])   . CERVICAL RADICULOPATHY 12/11/2008  . Condyloma acuminatum 04/23/2009  . CONSTIPATION 09/15/2010  . DYSPHAGIA UNSPECIFIED 09/09/2009  . 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 Eating disorder   . ENDOMETRIOSIS 12/16/2009  . FATIGUE 06/11/2010  . Fibromyalgia   . GERD 10/23/2009  . Heart palpitations   . HERPES SIMPLEX INFECTION 06/11/2010  . LENTIGO 04/23/2009  . LUNG NODULE 06/17/2010  . Narcotic abuse, continuous ([place])   . Ovarian cyst   . Palpitations 10/23/2009  . Panic disorder   .  Renal disorder   . Rheumatoid arthritis(714.0)   . Stricture and stenosis of esophagus 05/13/2010  . TOBACCO ABUSE 08/07/2008  . Urinary tract infection   . UTI (urinary tract infection)     Patient Active Problem List   Diagnosis Date Noted  . Bipolar affective disorder, current episode mixed ([place]) 01/02/2019  . Arthritis 01/02/2019  . Cigarette nicotine dependence without complication 04/30/1600  . Bipolar 1 disorder, mixed, moderate ([facility]) 07/15/2015    Class: Chronic  . 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 Sciatica 01/09/2015  . Reactive hypoglycemia 12/17/2013  . Musculoskeletal malfunction arising from mental factors 04/04/2013  . Incisional hernia 11/15/2012  . Other postprocedural status(V45.89) 11/15/2012  . Abdominal pain 11/11/2012  . Chronic pain syndrome 11/11/2012  . Disorder of sacrum 11/11/2012  . Heartburn 10/04/2011  . DYSPHAGIA 10/04/2011  . Depression with anxiety 09/15/2010  . CONSTIPATION 09/15/2010  . LUNG NODULE 06/17/2010  . Herpes simplex virus (HSV) infection 06/11/2010  . FATIGUE 06/11/2010  . STRICTURE AND STENOSIS OF ESOPHAGUS 05/13/2010  . ABDOMINAL WALL HERNIA 02/27/2010  . ENDOMETRIOSIS 12/16/2009  . GERD 10/23/2009  . PALPITATIONS 10/23/2009  . 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 CONDYLOMA ACUMINATUM 04/23/2009  . LENTIGO 04/23/2009  . DENTAL PAIN 03/19/2009  . CERVICAL RADICULOPATHY 12/11/2008  . 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 TOBACCO ABUSE 08/07/2008  . BACK PAIN, THORACIC REGION 07/07/2007  . ALLERGIC RHINITIS 06/02/2007    Past Surgical  History:  Procedure Laterality Date  . ABDOMINAL SURGERY    . ABDOMINAL WALL MESH  REMOVAL    . ABLATION ON ENDOMETRIOSIS    . CESAREAN SECTION     x2   . DILATION AND CURETTAGE OF UTERUS     x1   . ENDOMETRIAL ABLATION    . esophageal dilatation x 4    . GUM SURGERY    . HERNIA REPAIR     X3  . PARTIAL  HYSTERECTOMY       OB History    Gravida  3   Para  2   Term  2   Preterm  0   AB  1   Living  2     SAB  1   TAB  0   Ectopic  0   Multiple  0   Live Births  1            Home Medications    Prior to Admission medications   Medication Sig Start Date End Date Taking? Authorizing Provider  clonazePAM (KLONOPIN) 0.5 MG tablet Take 0.5 mg by mouth 2 (two) times daily.  08/30/18   [provider]  cyclobenzaprine (FLEXERIL) 10 MG tablet Take 10 mg by mouth 3 (three) times daily as needed for muscle spasms.  01/19/19   [provider]  diphenhydrAMINE (BENADRYL) 25 MG tablet Take 2 tablets (50 mg total) by mouth every 6 (six) hours as needed for allergies. 02/11/19   Jola Schmidt, MD  escitalopram (LEXAPRO) 20 MG tablet Take 30 mg by mouth at bedtime. 12/20/18   [provider]  famotidine (PEPCID) 20 MG tablet Take 1 tablet (20 mg total) by mouth 2 (two) times daily. 02/11/19   Jola Schmidt, MD  hydrOXYzine (ATARAX/VISTARIL) 25 MG tablet Take 1 tablet (25 mg total) by mouth every 6 (six) hours. 02/13/19   Varney Biles, MD  lurasidone (LATUDA) 80 MG TABS tablet Take 80 mg by mouth at bedtime.     [provider]  naloxegol oxalate (MOVANTIK) 25 MG TABS tablet Take 25 mg by mouth daily.    [provider]  oxyCODONE-acetaminophen (PERCOCET) 7.5-325 MG tablet Take 1 tablet by mouth 4 (four) times daily.    [provider]  promethazine (PHENERGAN) 25 MG tablet Take 1 tablet (25 mg total) by mouth every 6 (six) hours as needed for nausea or vomiting. 01/14/19   Street, [place], PA-C  REXULTI 1 MG TABS Take 1 tablet by mouth daily.  01/19/19   [provider]  valACYclovir (VALTREX) 1000 MG tablet Take 1,000 mg by mouth 2 (two) times daily.  03/08/19   [provider]    Family History Family History  Problem Relation Age of Onset  . Depression Mother   . Arthritis Mother   . Hyperlipidemia Father   .  Hypertension Father     Social History Social History   Tobacco Use  . Smoking status: Current Every Day Smoker    Packs/day: 1.00    Types: Cigarettes  . Smokeless tobacco: Never Used  Substance Use Topics  . Alcohol use: No  . Drug use: No     Allergies   Lamictal [lamotrigine], Morphine and related, Nicotine, Zofran, Chantix [varenicline tartrate], Codeine, Haldol [haloperidol], Ibuprofen, Lidoderm [lidocaine], Metaxalone, Sulfa antibiotics, Sulfasalazine, Toradol [ketorolac tromethamine], Duloxetine, Gabapentin, Ingrezza [valbenazine tosylate], Methadone, Pregabalin, Zolpidem, Amoxicillin, Aripiprazole, and Penicillins   Review of Systems Review of Systems  Constitutional:       Per  HPI, otherwise negative  HENT:       Per HPI, otherwise negative  Respiratory:       Per HPI, otherwise negative  Cardiovascular:       Per HPI, otherwise negative  Gastrointestinal: Positive for nausea. Negative for vomiting.  Endocrine:       Negative aside from HPI  Genitourinary:       Neg aside from HPI   Musculoskeletal:       Per HPI, otherwise negative  Skin: Negative.   Neurological: Positive for numbness. Negative for syncope.     Physical Exam Updated Vital Signs BP 93/63   Pulse 99   Temp 98.7 F (37.1 C) (Oral)   Resp 16   LMP 01/01/2004   SpO2 95%   Physical Exam Vitals signs and nursing note reviewed.  Constitutional:      General: She is not in acute distress.    Appearance: She is well-developed.     Comments: Uncomfortable appearing adult female awake and alert  HENT:     Head: Normocephalic and atraumatic.  Eyes:     Conjunctiva/sclera: Conjunctivae normal.  Cardiovascular:     Rate and Rhythm: Normal rate and regular rhythm.  Pulmonary:     Effort: Pulmonary effort is normal. No respiratory distress.     Breath sounds: Normal breath sounds. No stridor.  Abdominal:     General: There is no distension.  Musculoskeletal:     Comments: Mild  tenderness to palpation left superior posterior iliac crest.  Skin:    General: Skin is warm and dry.  Neurological:     Mental Status: She is alert and oriented to person, place, and time.     Cranial Nerves: No cranial nerve deficit.     Comments: Patient has persistent sensation in both distal lower extremities, has diminished reflexes bilaterally, has preserved motor function in both lower extremities as well.      ED Treatments / Results  Labs (all labs ordered are listed, but only abnormal results are displayed) Labs Reviewed - No data to display  EKG None  Radiology No results found.  Procedures Procedures (including critical care time)  Medications Ordered in ED Medications  promethazine (PHENERGAN) injection 25 mg (25 mg Intravenous Given 04/28/19 1639)  LORazepam (ATIVAN) injection 1 mg (1 mg Intravenous Given 04/28/19 1638)  HYDROmorphone (DILAUDID) injection 1 mg (1 mg Intravenous Given 04/28/19 1646)  dexamethasone (DECADRON) injection 10 mg (10 mg Intravenous Given 04/28/19 1644)  ketamine 50 mg in normal saline 5 mL (10 mg/mL) syringe (20 mg Intravenous Given 04/28/19 1759)     Initial Impression / Assessment and Plan / ED Course  I have reviewed the triage vital signs and the nursing notes.  Pertinent labs & imaging results that were available during my care of the patient were reviewed by me and considered in my medical decision making (see chart for details).    Review after initial visit notable for 14 prior ED visits in the past 6 months, and care everywhere notes notable for recent visit with her [facility] neurologist, plan for MRI this week.  Patient also follows with pain management.    6:31 PM Patient sitting upright, eating, feels substantially better.  She is received medicines including Ativan, Phenergan, Dilaudid and ketamine.  Without new complaints, no hemodynamic instability, with a previously diagnosed sciatica for which she has  appropriate follow-up in 72 hours, the patient was discharged in stable condition.  Final Clinical Impressions(s) / ED  Diagnoses   Final diagnoses:  Sciatica of left side     Carmin Muskrat, MD 04/28/19 [phone]

## 2019-04-28 NOTE — Discharge Instructions (Addendum)
Please be sure to keep your appointment with your physician in 2 days.  Return here for concerning changes in your condition.

## 2019-05-01 DIAGNOSIS — M5442 Lumbago with sciatica, left side: Secondary | ICD-10-CM | POA: Diagnosis not present

## 2019-05-01 DIAGNOSIS — G5702 Lesion of sciatic nerve, left lower limb: Secondary | ICD-10-CM | POA: Diagnosis not present

## 2019-05-03 DIAGNOSIS — M4802 Spinal stenosis, cervical region: Secondary | ICD-10-CM | POA: Diagnosis not present

## 2019-05-03 DIAGNOSIS — M48061 Spinal stenosis, lumbar region without neurogenic claudication: Secondary | ICD-10-CM | POA: Diagnosis not present

## 2019-05-03 DIAGNOSIS — M4807 Spinal stenosis, lumbosacral region: Secondary | ICD-10-CM | POA: Diagnosis not present

## 2019-05-08 ENCOUNTER — Emergency Department (HOSPITAL_COMMUNITY)
Admission: EM | Admit: 2019-05-08 | Discharge: 2019-05-08 | Disposition: A | Discharge status: Home / Self Care | Payer: Medicare Other | Attending: Emergency Medicine | Admitting: Emergency Medicine

## 2019-05-08 ENCOUNTER — Encounter (HOSPITAL_COMMUNITY): Payer: Self-pay | Admitting: Family Medicine

## 2019-05-08 DIAGNOSIS — F17210 Nicotine dependence, cigarettes, uncomplicated: Secondary | ICD-10-CM | POA: Insufficient documentation

## 2019-05-08 DIAGNOSIS — M5432 Sciatica, left side: Secondary | ICD-10-CM | POA: Diagnosis not present

## 2019-05-08 DIAGNOSIS — R52 Pain, unspecified: Secondary | ICD-10-CM | POA: Diagnosis not present

## 2019-05-08 DIAGNOSIS — I959 Hypotension, unspecified: Secondary | ICD-10-CM | POA: Diagnosis not present

## 2019-05-08 DIAGNOSIS — W19XXXA Unspecified fall, initial encounter: Secondary | ICD-10-CM | POA: Diagnosis not present

## 2019-05-08 DIAGNOSIS — M545 Low back pain: Secondary | ICD-10-CM | POA: Diagnosis present

## 2019-05-08 MED ORDER — LORAZEPAM 2 MG/ML IJ SOLN
0.5000 mg | Freq: Once | INTRAMUSCULAR | Status: AC
  Administered 2019-05-08: 20:00:00 0.5 mg via INTRAVENOUS
  Filled 2019-05-08: qty 1

## 2019-05-08 MED ORDER — PROMETHAZINE HCL 25 MG/ML IJ SOLN
12.5000 mg | Freq: Once | INTRAMUSCULAR | Status: AC
  Administered 2019-05-08: 18:00:00 12.5 mg via INTRAVENOUS
  Filled 2019-05-08: qty 1

## 2019-05-08 MED ORDER — HYDROMORPHONE HCL 1 MG/ML IJ SOLN
1.0000 mg | Freq: Once | INTRAMUSCULAR | Status: AC
  Administered 2019-05-08: 18:00:00 1 mg via INTRAVENOUS
  Filled 2019-05-08: qty 1

## 2019-05-08 MED ORDER — LORAZEPAM 2 MG/ML IJ SOLN
1.0000 mg | Freq: Once | INTRAMUSCULAR | Status: AC
  Administered 2019-05-08: 18:00:00 1 mg via INTRAVENOUS
  Filled 2019-05-08: qty 1

## 2019-05-08 MED ORDER — DEXAMETHASONE SODIUM PHOSPHATE 10 MG/ML IJ SOLN
10.0000 mg | Freq: Once | INTRAMUSCULAR | Status: AC
  Administered 2019-05-08: 18:00:00 10 mg via INTRAVENOUS
  Filled 2019-05-08: qty 1

## 2019-05-08 NOTE — ED Provider Notes (Signed)
[facility] DEPT Provider Note   CSN: [id number] Arrival date & time: 05/08/19  1635    History   Chief Complaint Chief Complaint  Patient presents with  . Sciatica    HPI Gina [name] is a 43 y.o. female who presents to the ED today complaining of a flare of her left sided hip/left lower back consistent with pt's sciatica. She recently received a steroid injection for her pain and is scheduled to see [facility] on Friday for further workup. Pt has been seen multiple times in the ED; most recent 06/27 where she received Dilaudid, Ativan, Phenergan, Decadron, and Ketamine injection. She reports she typically gets these medications when she comes to the ED and it helps her get through the pain. Pt takes Oxycodone and muscle relaxers at home; she has been taking these without relief today, prompting her to come to the ED. Denies fever, chills, midline back pain, urinary retention, urinary or bowel incontinence, saddle anesthesia.       Past Medical History:  Diagnosis Date  . ABDOMINAL WALL HERNIA 02/27/2010  . Abnormal Pap smear   . ALLERGIC RHINITIS 06/02/2007  . ANOREXIA, CHRONIC 08/07/2008  . ANXIETY 09/15/2010  . Arthritis   . BACK PAIN, THORACIC REGION 07/07/2007  . Bipolar disorder ([place])   . CERVICAL RADICULOPATHY 12/11/2008  . Condyloma acuminatum 04/23/2009  . CONSTIPATION 09/15/2010  . DYSPHAGIA UNSPECIFIED 09/09/2009  . 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 Eating disorder   . ENDOMETRIOSIS 12/16/2009  . FATIGUE 06/11/2010  . Fibromyalgia   . GERD 10/23/2009  . Heart palpitations   . HERPES SIMPLEX INFECTION 06/11/2010  . LENTIGO 04/23/2009  . LUNG NODULE 06/17/2010  . Narcotic abuse, continuous ([place])   . Ovarian cyst   . Palpitations 10/23/2009  . Panic disorder   . Renal disorder   . Rheumatoid arthritis(714.0)   . Stricture and stenosis of esophagus 05/13/2010  . TOBACCO ABUSE 08/07/2008  . Urinary tract infection   . UTI (urinary tract  infection)     Patient Active Problem List   Diagnosis Date Noted  . Bipolar affective disorder, current episode mixed ([place]) 01/02/2019  . Arthritis 01/02/2019  . Cigarette nicotine dependence without complication 40/98/1191  . Bipolar 1 disorder, mixed, moderate ([facility]) 07/15/2015    Class: Chronic  . 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 Sciatica 01/09/2015  . Reactive hypoglycemia 12/17/2013  . Musculoskeletal malfunction arising from mental factors 04/04/2013  . Incisional hernia 11/15/2012  . Other postprocedural status(V45.89) 11/15/2012  . Abdominal pain 11/11/2012  . Chronic pain syndrome 11/11/2012  . Disorder of sacrum 11/11/2012  . Heartburn 10/04/2011  . DYSPHAGIA 10/04/2011  . Depression with anxiety 09/15/2010  . CONSTIPATION 09/15/2010  . LUNG NODULE 06/17/2010  . Herpes simplex virus (HSV) infection 06/11/2010  . FATIGUE 06/11/2010  . STRICTURE AND STENOSIS OF ESOPHAGUS 05/13/2010  . ABDOMINAL WALL HERNIA 02/27/2010  . ENDOMETRIOSIS 12/16/2009  . GERD 10/23/2009  . PALPITATIONS 10/23/2009  . 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 CONDYLOMA ACUMINATUM 04/23/2009  . LENTIGO 04/23/2009  . DENTAL PAIN 03/19/2009  . CERVICAL RADICULOPATHY 12/11/2008  . 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 TOBACCO ABUSE 08/07/2008  . BACK PAIN, THORACIC REGION 07/07/2007  . ALLERGIC RHINITIS 06/02/2007    Past Surgical History:  Procedure Laterality Date  . ABDOMINAL SURGERY    . ABDOMINAL WALL MESH  REMOVAL    . ABLATION ON ENDOMETRIOSIS    .  CESAREAN SECTION     x2   . DILATION AND CURETTAGE OF UTERUS     x1   . ENDOMETRIAL ABLATION    . esophageal dilatation x 4    . GUM SURGERY    . HERNIA REPAIR     X3  . PARTIAL HYSTERECTOMY       OB History    Gravida  3   Para  2   Term  2   Preterm  0   AB  1   Living  2     SAB  1   TAB  0   Ectopic  0   Multiple  0   Live Births   1            Home Medications    Prior to Admission medications   Medication Sig Start Date End Date Taking? Authorizing Provider  clonazePAM (KLONOPIN) 0.5 MG tablet Take 0.5 mg by mouth 2 (two) times daily.  08/30/18   [provider]  cyclobenzaprine (FLEXERIL) 10 MG tablet Take 10 mg by mouth 3 (three) times daily as needed for muscle spasms.  01/19/19   [provider]  diphenhydrAMINE (BENADRYL) 25 MG tablet Take 2 tablets (50 mg total) by mouth every 6 (six) hours as needed for allergies. 02/11/19   Jola Schmidt, MD  escitalopram (LEXAPRO) 20 MG tablet Take 30 mg by mouth at bedtime. 12/20/18   [provider]  famotidine (PEPCID) 20 MG tablet Take 1 tablet (20 mg total) by mouth 2 (two) times daily. 02/11/19   Jola Schmidt, MD  hydrOXYzine (ATARAX/VISTARIL) 25 MG tablet Take 1 tablet (25 mg total) by mouth every 6 (six) hours. 02/13/19   Varney Biles, MD  lurasidone (LATUDA) 80 MG TABS tablet Take 80 mg by mouth at bedtime.     [provider]  naloxegol oxalate (MOVANTIK) 25 MG TABS tablet Take 25 mg by mouth daily.    [provider]  oxyCODONE-acetaminophen (PERCOCET) 7.5-325 MG tablet Take 1 tablet by mouth 4 (four) times daily.    [provider]  promethazine (PHENERGAN) 25 MG tablet Take 1 tablet (25 mg total) by mouth every 6 (six) hours as needed for nausea or vomiting. 01/14/19   Street, [place], PA-C  REXULTI 1 MG TABS Take 1 tablet by mouth daily.  01/19/19   [provider]  valACYclovir (VALTREX) 1000 MG tablet Take 1,000 mg by mouth 2 (two) times daily.  03/08/19   [provider]    Family History Family History  Problem Relation Age of Onset  . Depression Mother   . Arthritis Mother   . Hyperlipidemia Father   . Hypertension Father     Social History Social History   Tobacco Use  . Smoking status: Current Every Day Smoker    Packs/day: 1.00    Types: Cigarettes  . Smokeless tobacco:  Never Used  Substance Use Topics  . Alcohol use: No  . Drug use: No     Allergies   Lamictal [lamotrigine], Morphine and related, Nicotine, Zofran, Chantix [varenicline tartrate], Codeine, Haldol [haloperidol], Ibuprofen, Lidoderm [lidocaine], Metaxalone, Sulfa antibiotics, Sulfasalazine, Toradol [ketorolac tromethamine], Duloxetine, Gabapentin, Ingrezza [valbenazine tosylate], Methadone, Pregabalin, Zolpidem, Amoxicillin, Aripiprazole, and Penicillins   Review of Systems Review of Systems  Constitutional: Negative for chills and fever.  Genitourinary: Negative for difficulty urinating.  Musculoskeletal: Positive for back pain.     Physical Exam Updated Vital Signs BP 99/73 (BP Location: Left Arm)   Pulse 79  Temp 99.6 F (37.6 C) (Oral)   Resp (!) 36   Ht 5\' 7"  (1.702 m)   Wt 65.8 kg   LMP 01/01/2004   SpO2 97% Comment: RA  BMI 22.71 kg/m   Physical Exam Vitals signs and nursing note reviewed.  Constitutional:      Appearance: She is not ill-appearing.  HENT:     Head: Normocephalic and atraumatic.  Eyes:     Conjunctiva/sclera: Conjunctivae normal.  Cardiovascular:     Rate and Rhythm: Normal rate and regular rhythm.  Pulmonary:     Effort: Pulmonary effort is normal.     Breath sounds: Normal breath sounds.  Musculoskeletal:     Comments: C, T, and L spine with limited ROM due to pain without spinous process TTP, no bony stepoffs or deformities, mild left lumbar paraspinal TTP with most tenderness location to left hip. Negative SLR bilaterally. No overlying skin changes. Strength and sensation intact in all extremities. Distal pulses intact.   Skin:    General: Skin is warm and dry.     Coloration: Skin is not jaundiced.  Neurological:     Mental Status: She is alert.      ED Treatments / Results  Labs (all labs ordered are listed, but only abnormal results are displayed) Labs Reviewed - No data to display  EKG None  Radiology No results found.   Procedures Procedures (including critical care time)  Medications Ordered in ED Medications  HYDROmorphone (DILAUDID) injection 1 mg (1 mg Intravenous Given 05/08/19 1819)  promethazine (PHENERGAN) injection 12.5 mg (12.5 mg Intravenous Given 05/08/19 1818)  LORazepam (ATIVAN) injection 1 mg (1 mg Intravenous Given 05/08/19 1816)  dexamethasone (DECADRON) injection 10 mg (10 mg Intravenous Given 05/08/19 1820)  LORazepam (ATIVAN) injection 0.5 mg (0.5 mg Intravenous Given 05/08/19 2021)     Initial Impression / Assessment and Plan / ED Course  I have reviewed the triage vital signs and the nursing notes.  Pertinent labs & imaging results that were available during my care of the patient were reviewed by me and considered in my medical decision making (see chart for details).    43 year old female presenting to the ED with left sciatica pain worsening over the past day or two as well as nausea. She has hx of similar with multiple ED visits in the past. Recently had steroid injection and has follow up with [facility] on Friday. Pt screaming in pain during evaluation; she seems to be very familiar with the pain medication that she typically gets in the ED. Concern for drug seeking behavior. No red flag symptoms including fever, chills, urinary or bowel incontinence, urinary retention, saddle anesthesia. Strength and sensation intact during physical exam. Will provide her usual cocktail without the ketamine injection that was provided at last visit and reevaluate.   Upon reevaluation patient states her pain is better but she would like "2 of the 3 medications she received earlier." She reports her nausea has improved in the ED with phenergan. Will give another 0.5 mg Ativan and discharge patient home.       Final Clinical Impressions(s) / ED Diagnoses   Final diagnoses:  Sciatica of left side    ED Discharge Orders    None       Eustaquio Maize, PA-C 05/09/19 0142    Hayden Rasmussen, MD  05/09/19 [phone]

## 2019-05-08 NOTE — ED Notes (Signed)
When patient attempted to urinate with the purwick, she accidentally urinated on the incontinence pad. Changed patients incontinence pad and provided patient a soapy wash cloth to perform personal hygiene.

## 2019-05-08 NOTE — ED Triage Notes (Signed)
Patient is from home and transported via [facility]. Patient is complaining of left sciatica pain (left lower back pain that radiates to left leg). Patient has a [facility] appointment on Friday. Also, had a steroid injection last week. EMS states they were called out for a fall. She is currently not on blood thinners or hit her head. Also, EMS reports patient calls frequently for Ketamine infections.

## 2019-05-11 ENCOUNTER — Other Ambulatory Visit: Payer: Self-pay

## 2019-05-11 DIAGNOSIS — M5440 Lumbago with sciatica, unspecified side: Secondary | ICD-10-CM | POA: Diagnosis not present

## 2019-05-11 DIAGNOSIS — M5136 Other intervertebral disc degeneration, lumbar region: Secondary | ICD-10-CM | POA: Diagnosis not present

## 2019-05-11 DIAGNOSIS — M549 Dorsalgia, unspecified: Secondary | ICD-10-CM | POA: Diagnosis not present

## 2019-05-11 DIAGNOSIS — M62838 Other muscle spasm: Secondary | ICD-10-CM | POA: Diagnosis not present

## 2019-05-11 DIAGNOSIS — M5416 Radiculopathy, lumbar region: Secondary | ICD-10-CM | POA: Diagnosis not present

## 2019-05-11 NOTE — Patient Outreach (Signed)
[facility]) Care Management  05/11/2019  Gina [name] Mar 10, 1976 [id number]  TELEPHONE SCREENING Referral date: 05/10/19 Referral source: utilization management Referral reason :ER Visits for sciatica  Insurance: [employer] Attempt #1  Telephone call to patient regarding utilization management referral. Patient states, " this is not a good time."  Unable to leave return contact phone number.   PLAN: RNCM will attempt 2nd telephone call to patient within 4 business days.  RNCM will send patient outreach letter to attempt contact.   Quinn Plowman RN,BSN,CCM [facility] Telephonic  [phone]

## 2019-05-15 ENCOUNTER — Ambulatory Visit: Payer: Self-pay

## 2019-05-15 ENCOUNTER — Other Ambulatory Visit: Payer: Self-pay

## 2019-05-15 NOTE — Patient Outreach (Signed)
[facility]) Care Management  05/15/2019  Gina [name] [birth year] [id number]  TELEPHONE SCREENING Referral date: 05/10/19 Referral source: utilization management Referral reason :ER Visits for sciatica  Insurance: [employer] care Attempt #2  Telephone call to patient regarding utilization management referral. Unable to reach patient. HIPAA compliant voice message left with call back phone number.   PLAN; RNCM will attempt 3rd telephone call to patient within 4 business days.   Quinn Plowman RN,BSN,CCM [facility] Telephonic  [phone]

## 2019-05-16 ENCOUNTER — Other Ambulatory Visit: Payer: Self-pay

## 2019-05-16 NOTE — Patient Outreach (Signed)
[facility]) Care Management  05/16/2019  Gina [name] 1975/11/30 [id number]  TELEPHONE SCREENING Referral date:05/10/19 Referral source:utilization management Referral reason :ER Visits for sciatica Insurance:United health care Attempt #3  Telephone call to patient regarding utilization management referral. Unable to reach patient. HIPAA compliant voice message left with call back phone number.   PLAN: If no return call will proceed with closure.     Quinn Plowman RN,BSN,CCM [facility] Telephonic  [phone]

## 2019-05-17 ENCOUNTER — Encounter (HOSPITAL_COMMUNITY): Payer: Self-pay | Admitting: Emergency Medicine

## 2019-05-17 ENCOUNTER — Emergency Department (HOSPITAL_COMMUNITY)
Admission: EM | Admit: 2019-05-17 | Discharge: 2019-05-17 | Disposition: A | Discharge status: Home / Self Care | Payer: Medicare Other | Attending: Emergency Medicine | Admitting: Emergency Medicine

## 2019-05-17 ENCOUNTER — Ambulatory Visit: Payer: Self-pay

## 2019-05-17 ENCOUNTER — Other Ambulatory Visit: Payer: Self-pay

## 2019-05-17 DIAGNOSIS — M545 Low back pain: Secondary | ICD-10-CM | POA: Diagnosis not present

## 2019-05-17 DIAGNOSIS — F17210 Nicotine dependence, cigarettes, uncomplicated: Secondary | ICD-10-CM | POA: Insufficient documentation

## 2019-05-17 DIAGNOSIS — M5489 Other dorsalgia: Secondary | ICD-10-CM | POA: Diagnosis not present

## 2019-05-17 DIAGNOSIS — G8929 Other chronic pain: Secondary | ICD-10-CM | POA: Diagnosis not present

## 2019-05-17 DIAGNOSIS — Z880 Allergy status to penicillin: Secondary | ICD-10-CM | POA: Diagnosis not present

## 2019-05-17 MED ORDER — LORAZEPAM 2 MG/ML IJ SOLN: 1.0000 mg | Freq: Once | INTRAMUSCULAR | Status: DC

## 2019-05-17 MED ORDER — HYDROMORPHONE HCL 2 MG/ML IJ SOLN
2.0000 mg | Freq: Once | INTRAMUSCULAR | Status: AC
  Administered 2019-05-17: 2 mg via INTRAMUSCULAR
  Filled 2019-05-17: qty 1

## 2019-05-17 MED ORDER — DEXAMETHASONE SODIUM PHOSPHATE 10 MG/ML IJ SOLN
10.0000 mg | Freq: Once | INTRAMUSCULAR | Status: AC
  Administered 2019-05-17: 19:00:00 10 mg via INTRAMUSCULAR
  Filled 2019-05-17: qty 1

## 2019-05-17 MED ORDER — LORAZEPAM 2 MG/ML IJ SOLN
1.0000 mg | Freq: Once | INTRAMUSCULAR | Status: AC
  Administered 2019-05-17: 19:00:00 1 mg via INTRAMUSCULAR
  Filled 2019-05-17: qty 1

## 2019-05-17 NOTE — ED Notes (Signed)
HOT COMPRESS PLACED ON BACK TO EASE MUSCLE PAIN

## 2019-05-17 NOTE — Discharge Instructions (Addendum)
SEEK IMMEDIATE MEDICAL ATTENTION IF: New numbness, tingling, weakness, or problem with the use of your arms or legs.  Severe back pain not relieved with medications.  Change in bowel or bladder control.  Increasing pain in any areas of the body (such as chest or abdominal pain).  Shortness of breath, dizziness or fainting.  Nausea (feeling sick to your stomach), vomiting, fever, or sweats.  

## 2019-05-17 NOTE — ED Triage Notes (Addendum)
Per EMS, patient from home, c/o lower back pain radiating down left leg x4 hours. Hx sciatica.

## 2019-05-17 NOTE — TOC Initial Note (Addendum)
Transition of Care [facility]) - Initial/Assessment Note    Patient Details  Name: Gina [name] MRN: [id number] Date of Birth: 11-02-1975  Transition of [facility]) CM/SW Contact:    Erenest Rasher, RN Phone Number: [phone] 05/17/2019, 8:01 PM  Clinical Narrative:    16 ED visits in 6 months              Spoke to pt and states she is currently following up at [facility] and was referred to an Orthopedic Surgeon. Had her first visit on last week. She has a nerve conduction test on 06/21/2019. States she has been battling with the pain for years and she is hoping to have surgery to improve her quality of life. States she has a grand child on the way and feels she will not be able to care or hold when the baby comes. States she see Neurologist, Dr Ermalene Postin in [place]. And does not have name of new Ortho Surgeon. States she went to [facility] for a 2nd opinion to get the best treatment for her current medical condition. She is able to afford meds and is compliant with all her appts. 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 Spoke with her about [facility] follow up and she states she is agreeable to continued participation in program but not sure how they can assist. Offered PT as HH or outpt. States she had PT in the past and made the pain worse.    Expected Discharge Plan: Home/Self Care Barriers to Discharge: Continued Medical Work up   Patient Goals and CMS Choice        Expected Discharge Plan and Services Expected Discharge Plan: Home/Self Care       Living arrangements for the past 2 months: Single Family Home                                      Prior Living Arrangements/Services Living arrangements for the past 2 months: Single Family Home Lives with:: Spouse Patient language and need for interpreter reviewed:: Yes Do you feel safe going back to the place where you live?: Yes      Need for Family Participation in Patient Care: Yes (Comment) Care giver support system in place?: Yes  (comment) Current home services: DME(walker, wheelchair) Criminal Activity/Legal Involvement Pertinent to Current Situation/Hospitalization: No - Comment as needed  Activities of Daily Living      Permission Sought/Granted Permission sought to share information with : Case Manager, PCP, Family Supports Permission granted to share information with : Yes, Verbal Permission Granted  Share Information with NAME: Arlicia Paquette     Permission granted to share info w Relationship: husband  Permission granted to share info w Contact Information: [phone]  Emotional Assessment   Attitude/Demeanor/Rapport: Engaged Affect (typically observed): Accepting Orientation: : Oriented to Self, Oriented to Place, Oriented to  Time, Oriented to Situation   Psych Involvement: No (comment)  Admission diagnosis:  Back Pain  Patient Active Problem List   Diagnosis Date Noted  . Bipolar affective disorder, current episode mixed ([place]) 01/02/2019  . Arthritis 01/02/2019  . Cigarette nicotine dependence without complication 35/46/5681  . Bipolar 1 disorder, mixed, moderate ([facility]) 07/15/2015    Class: Chronic  . 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 Sciatica 01/09/2015  . Reactive hypoglycemia 12/17/2013  . Musculoskeletal malfunction arising from mental factors 04/04/2013  . Incisional hernia 11/15/2012  .  Other postprocedural status(V45.89) 11/15/2012  . Abdominal pain 11/11/2012  . Chronic pain syndrome 11/11/2012  . Disorder of sacrum 11/11/2012  . Heartburn 10/04/2011  . DYSPHAGIA 10/04/2011  . Depression with anxiety 09/15/2010  . CONSTIPATION 09/15/2010  . LUNG NODULE 06/17/2010  . Herpes simplex virus (HSV) infection 06/11/2010  . FATIGUE 06/11/2010  . STRICTURE AND STENOSIS OF ESOPHAGUS 05/13/2010  . ABDOMINAL WALL HERNIA 02/27/2010  . ENDOMETRIOSIS 12/16/2009  . GERD 10/23/2009  . PALPITATIONS 10/23/2009  . 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 DYSTHYMIC  DISORDER 06/20/2009  . CONDYLOMA ACUMINATUM 04/23/2009  . LENTIGO 04/23/2009  . DENTAL PAIN 03/19/2009  . CERVICAL RADICULOPATHY 12/11/2008  . 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 TOBACCO ABUSE 08/07/2008  . BACK PAIN, THORACIC REGION 07/07/2007  . ALLERGIC RHINITIS 06/02/2007   PCP:  Rutherford Guys, MD Pharmacy:   [facility] [address] [place] [address] Phone: [phone] Fax: [place], [address] [place] [address] Phone: [phone] Fax: [place], [address] [place] [address] Phone: [phone] Fax: [phone]  [id number] [address], [address]. [place] [address] Phone: [phone] Fax: [phone]  [id number] [place], [address] AT [address] [place] [place] [address] Phone: [phone] Fax: [phone]  [facility] [phone] - [place], [address] [place] [address] Phone: [phone] Fax: [place], [address] AT [facility] [address] [place] [address] [place] [address] Phone: [phone] Fax: [phone]     Social Determinants of Health (SDOH) Interventions    Readmission Risk Interventions No flowsheet data found.

## 2019-05-17 NOTE — ED Provider Notes (Signed)
[facility] DEPT Provider Note   CSN: [id number] Arrival date & time: 05/17/19  1735     History   Chief Complaint Chief Complaint  Patient presents with   Back Pain    HPI Gina [name] is a 43 y.o. female who presents emergency department with chief complaint of back pain.  She has a well-documented history of lumbar pain with sciatic-like features and has been to the emergency department multiple times with the same complaint.  Of note the patient has also multiple allergies and intolerances to medications.  She is on chronic opiate therapy.  She has been seen at [facility] and recently had an MRI that showed no specific findings that could explain the patient's sciatica specifically no advanced degenerative changes and no specific myelopathy or radiculopathy to explain the patient's issues.  Patient states that she has had about 6 hours of severe pain radiating down her left leg.  She states that sometimes when her pain gets really bad she pees a little bit.  She is not losing full control of her bladder or bowel.  She denies any new saddle anesthesia.  The pain is the same as previous.  She states that the only thing that works for her is Dilaudid Phenergan and Ativan and a shot of Decadron.  She denies any fever, chills or recent procedures to her back.     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   Eating disorder    ENDOMETRIOSIS 12/16/2009   FATIGUE 06/11/2010   Fibromyalgia    GERD 10/23/2009   Heart palpitations    HERPES SIMPLEX INFECTION  06/11/2010   LENTIGO 04/23/2009   LUNG NODULE 06/17/2010   Narcotic abuse, continuous ([place])    Ovarian cyst    Palpitations 10/23/2009   Panic disorder    Renal disorder    Rheumatoid arthritis(714.0)    Stricture and stenosis of esophagus 05/13/2010   TOBACCO ABUSE 08/07/2008   Urinary tract infection    UTI (urinary tract infection)     Patient Active Problem List   Diagnosis Date Noted   Bipolar affective disorder, current episode mixed ([place]) 01/02/2019   Arthritis 01/02/2019   Cigarette nicotine dependence without complication 32/99/2426   Bipolar 1 disorder, mixed, moderate ([place]) 07/15/2015    Class: Chronic   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   Sciatica 01/09/2015   Reactive hypoglycemia 12/17/2013   Musculoskeletal malfunction arising from mental factors 04/04/2013   Incisional hernia 11/15/2012   Other postprocedural status(V45.89) 11/15/2012   Abdominal pain 11/11/2012   Chronic pain syndrome 11/11/2012   Disorder of sacrum 11/11/2012   Heartburn 10/04/2011   DYSPHAGIA 10/04/2011   Depression with anxiety 09/15/2010   CONSTIPATION 09/15/2010   LUNG NODULE 06/17/2010   Herpes simplex virus (HSV) infection 06/11/2010   FATIGUE 06/11/2010   STRICTURE AND STENOSIS OF ESOPHAGUS 05/13/2010   ABDOMINAL WALL HERNIA 02/27/2010   ENDOMETRIOSIS 12/16/2009   GERD 10/23/2009   PALPITATIONS 10/23/2009   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   DYSTHYMIC DISORDER 06/20/2009   CONDYLOMA ACUMINATUM 04/23/2009   LENTIGO  04/23/2009   DENTAL PAIN 03/19/2009   CERVICAL RADICULOPATHY 12/11/2008   PANIC DISORDER WITHOUT AGORAPHOBIA 08/07/2008   TOBACCO ABUSE 08/07/2008   BACK PAIN, THORACIC REGION 07/07/2007   ALLERGIC RHINITIS 06/02/2007    Past Surgical History:  Procedure Laterality Date   ABDOMINAL SURGERY     ABDOMINAL WALL MESH  REMOVAL     ABLATION ON ENDOMETRIOSIS     CESAREAN SECTION     x2      DILATION AND CURETTAGE OF UTERUS     x1    ENDOMETRIAL ABLATION     esophageal dilatation x 4     GUM SURGERY     HERNIA REPAIR     X3   PARTIAL HYSTERECTOMY       OB History    Gravida  3   Para  2   Term  2   Preterm  0   AB  1   Living  2     SAB  1   TAB  0   Ectopic  0   Multiple  0   Live Births  1            Home Medications    Prior to Admission medications   Medication Sig Start Date End Date Taking? Authorizing Provider  clonazePAM (KLONOPIN) 0.5 MG tablet Take 0.5 mg by mouth 2 (two) times daily.  08/30/18   [provider]  cyclobenzaprine (FLEXERIL) 10 MG tablet Take 10 mg by mouth 3 (three) times daily as needed for muscle spasms.  01/19/19   [provider]  diphenhydrAMINE (BENADRYL) 25 MG tablet Take 2 tablets (50 mg total) by mouth every 6 (six) hours as needed for allergies. 02/11/19   Jola Schmidt, MD  escitalopram (LEXAPRO) 20 MG tablet Take 30 mg by mouth at bedtime. 12/20/18   [provider]  famotidine (PEPCID) 20 MG tablet Take 1 tablet (20 mg total) by mouth 2 (two) times daily. 02/11/19   Jola Schmidt, MD  hydrOXYzine (ATARAX/VISTARIL) 25 MG tablet Take 1 tablet (25 mg total) by mouth every 6 (six) hours. 02/13/19   Varney Biles, MD  lurasidone (LATUDA) 80 MG TABS tablet Take 80 mg by mouth at bedtime.     [provider]  naloxegol oxalate (MOVANTIK) 25 MG TABS tablet Take 25 mg by mouth daily.    [provider]  oxyCODONE-acetaminophen (PERCOCET) 7.5-325 MG tablet Take 1 tablet by mouth 4 (four) times daily.    [provider]  promethazine (PHENERGAN) 25 MG tablet Take 1 tablet (25 mg total) by mouth every 6 (six) hours as needed for nausea or vomiting. 01/14/19   Street, [place], PA-C  REXULTI 1 MG TABS Take 1 tablet by mouth daily.  01/19/19   [provider]  valACYclovir (VALTREX) 1000 MG tablet Take 1,000 mg by mouth 2 (two) times daily.  03/08/19    [provider]    Family History Family History  Problem Relation Age of Onset   Depression Mother    Arthritis Mother    Hyperlipidemia Father    Hypertension Father     Social History Social History   Tobacco Use   Smoking status: Current Every Day Smoker    Packs/day: 1.00    Types: Cigarettes   Smokeless tobacco: Never Used  Substance Use Topics   Alcohol use: No   Drug use: No     Allergies   Lamictal [lamotrigine], Morphine and related, Nicotine, Zofran, Chantix [varenicline tartrate], Codeine,  Haldol [haloperidol], Ibuprofen, Lidoderm [lidocaine], Metaxalone, Sulfa antibiotics, Sulfasalazine, Toradol [ketorolac tromethamine], Duloxetine, Gabapentin, Ingrezza [valbenazine tosylate], Methadone, Pregabalin, Zolpidem, Amoxicillin, Aripiprazole, and Penicillins   Review of Systems Review of Systems  Ten systems reviewed and are negative for acute change, except as noted in the HPI.   Physical Exam Updated Vital Signs BP 109/73    Pulse 100    Temp 98.5 F (36.9 C) (Oral)    Resp 20    Ht 5\' 7"  (1.702 m)    Wt 65.8 kg    LMP 01/01/2004    SpO2 100%    BMI 22.71 kg/m   Physical Exam Vitals signs and nursing note reviewed.  Constitutional:      General: She is not in acute distress.    Appearance: She is well-developed. She is not diaphoretic.     Comments: Patient is screaming and writhing on the bed holding her left buttock.  HENT:     Head: Normocephalic and atraumatic.  Eyes:     General: No scleral icterus.    Conjunctiva/sclera: Conjunctivae normal.  Neck:     Musculoskeletal: Normal range of motion.  Cardiovascular:     Rate and Rhythm: Normal rate and regular rhythm.     Heart sounds: Normal heart sounds. No murmur. No friction rub. No gallop.   Pulmonary:     Effort: Pulmonary effort is normal. No respiratory distress.     Breath sounds: Normal breath sounds.  Abdominal:     General: Bowel sounds are normal. There is no  distension.     Palpations: Abdomen is soft. There is no mass.     Tenderness: There is no abdominal tenderness. There is no guarding.  Musculoskeletal:     Comments: Tender to palpation with spasmodic tissue of the left iliopsoas, left lumbar paraspinals, exquisitely tender over the posterior left trochanter.  Patient does not allow me to mobilize the hip joint.  Skin:    General: Skin is warm and dry.  Neurological:     Mental Status: She is alert and oriented to person, place, and time.  Psychiatric:        Behavior: Behavior normal.      ED Treatments / Results  Labs (all labs ordered are listed, but only abnormal results are displayed) Labs Reviewed - No data to display  EKG None  Radiology No results found.  Procedures Procedures (including critical care time)  Medications Ordered in ED Medications  dexamethasone (DECADRON) injection 10 mg (has no administration in time range)  HYDROmorphone (DILAUDID) injection 2 mg (2 mg Intramuscular Given 05/17/19 1920)  LORazepam (ATIVAN) injection 1 mg (1 mg Intramuscular Given 05/17/19 1922)     Initial Impression / Assessment and Plan / ED Course  I have reviewed the triage vital signs and the nursing notes.  Pertinent labs & imaging results that were available during my care of the patient were reviewed by me and considered in my medical decision making (see chart for details).     I have concern Patient with back pain.  No neurological deficits and normal neuro exam.  Patient can walk but states is painful.  No loss of bowel or bladder control.  No concern for cauda equina.  No fever, night sweats, weight loss, h/o cancer, IVDU.  RICE protocol and pain medicine indicated and discussed with patient.        Final Clinical Impressions(s) / ED Diagnoses   Final diagnoses:  None    ED Discharge Orders  None       Margarita Mail, PA-C 05/18/19 2314    Lacretia Leigh, MD 05/21/19 [phone]

## 2019-05-17 NOTE — ED Notes (Signed)
Writer of this note went to administer medications to pt. Pt stated she did not want to get shots, she wanted an IV. Stated she discussed IV with PA. Writer of this note went and spoke to PA, who verified IM meds over IV. Went back into pt room and pt agreed to IM medication, however was verbally upset about it.

## 2019-05-22 DIAGNOSIS — K5732 Diverticulitis of large intestine without perforation or abscess without bleeding: Secondary | ICD-10-CM | POA: Diagnosis not present

## 2019-05-22 DIAGNOSIS — R1032 Left lower quadrant pain: Secondary | ICD-10-CM | POA: Diagnosis not present

## 2019-05-25 ENCOUNTER — Encounter: Payer: Self-pay | Admitting: Family Medicine

## 2019-05-25 ENCOUNTER — Telehealth (INDEPENDENT_AMBULATORY_CARE_PROVIDER_SITE_OTHER): Payer: Medicare Other | Admitting: Family Medicine

## 2019-05-25 ENCOUNTER — Other Ambulatory Visit: Payer: Self-pay

## 2019-05-25 ENCOUNTER — Emergency Department (HOSPITAL_COMMUNITY)
Admission: EM | Admit: 2019-05-25 | Discharge: 2019-05-26 | Disposition: A | Discharge status: Home / Self Care | Payer: Medicare Other | Attending: Emergency Medicine | Admitting: Emergency Medicine

## 2019-05-25 ENCOUNTER — Encounter (HOSPITAL_COMMUNITY): Payer: Self-pay | Admitting: Emergency Medicine

## 2019-05-25 DIAGNOSIS — N809 Endometriosis, unspecified: Secondary | ICD-10-CM | POA: Insufficient documentation

## 2019-05-25 DIAGNOSIS — R1032 Left lower quadrant pain: Secondary | ICD-10-CM | POA: Diagnosis not present

## 2019-05-25 DIAGNOSIS — E7800 Pure hypercholesterolemia, unspecified: Secondary | ICD-10-CM | POA: Diagnosis not present

## 2019-05-25 DIAGNOSIS — R102 Pelvic and perineal pain: Secondary | ICD-10-CM | POA: Insufficient documentation

## 2019-05-25 DIAGNOSIS — R1030 Lower abdominal pain, unspecified: Secondary | ICD-10-CM | POA: Diagnosis present

## 2019-05-25 DIAGNOSIS — K5732 Diverticulitis of large intestine without perforation or abscess without bleeding: Secondary | ICD-10-CM

## 2019-05-25 DIAGNOSIS — R1031 Right lower quadrant pain: Secondary | ICD-10-CM | POA: Diagnosis not present

## 2019-05-25 DIAGNOSIS — G8929 Other chronic pain: Secondary | ICD-10-CM | POA: Diagnosis not present

## 2019-05-25 DIAGNOSIS — M797 Fibromyalgia: Secondary | ICD-10-CM | POA: Insufficient documentation

## 2019-05-25 DIAGNOSIS — R197 Diarrhea, unspecified: Secondary | ICD-10-CM | POA: Diagnosis not present

## 2019-05-25 DIAGNOSIS — Z79899 Other long term (current) drug therapy: Secondary | ICD-10-CM | POA: Insufficient documentation

## 2019-05-25 DIAGNOSIS — M5442 Lumbago with sciatica, left side: Secondary | ICD-10-CM | POA: Diagnosis not present

## 2019-05-25 DIAGNOSIS — F17210 Nicotine dependence, cigarettes, uncomplicated: Secondary | ICD-10-CM | POA: Diagnosis not present

## 2019-05-25 LAB — COMPREHENSIVE METABOLIC PANEL
ALT: 32 U/L (ref 0–44)
AST: 21 U/L (ref 15–41)
Albumin: 4.1 g/dL (ref 3.5–5.0)
Alkaline Phosphatase: 82 U/L (ref 38–126)
Anion gap: 9 (ref 5–15)
BUN: 16 mg/dL (ref 6–20)
CO2: 26 mmol/L (ref 22–32)
Calcium: 8.8 mg/dL — ABNORMAL LOW (ref 8.9–10.3)
Chloride: 103 mmol/L (ref 98–111)
Creatinine, Ser: 0.57 mg/dL (ref 0.44–1.00)
GFR calc Af Amer: >60 mL/min (ref >60)
GFR calc non Af Amer: >60 mL/min (ref >60)
Glucose, Bld: 93 mg/dL (ref 70–99)
Potassium: 4.1 mmol/L (ref 3.5–5.1)
Sodium: 138 mmol/L (ref 135–145)
Total Bilirubin: 0.5 mg/dL (ref 0.3–1.2)
Total Protein: 6.6 g/dL (ref 6.5–8.1)

## 2019-05-25 LAB — CBC
HCT: 45.2 % (ref 36.0–46.0)
Hemoglobin: 15.2 g/dL — ABNORMAL HIGH (ref 12.0–15.0)
MCH: 33.6 pg (ref 26.0–34.0)
MCHC: 33.6 g/dL (ref 30.0–36.0)
MCV: 99.8 fL (ref 80.0–100.0)
Platelets: 231 10*3/uL (ref 150–400)
RBC: 4.53 MIL/uL (ref 3.87–5.11)
RDW: 11.9 % (ref 11.5–15.5)
WBC: 7.8 10*3/uL (ref 4.0–10.5)
nRBC: 0 % (ref 0.0–0.2)

## 2019-05-25 LAB — I-STAT BETA HCG BLOOD, ED (MC, WL, AP ONLY): I-stat hCG, quantitative: <5 m[IU]/mL (ref <5)

## 2019-05-25 LAB — LIPASE, BLOOD: Lipase: 27 U/L (ref 11–51)

## 2019-05-25 MED ORDER — PROMETHAZINE HCL 25 MG/ML IJ SOLN
6.2500 mg | Freq: Once | INTRAMUSCULAR | Status: AC
  Administered 2019-05-25: 23:00:00 6.25 mg via INTRAVENOUS
  Filled 2019-05-25: qty 1

## 2019-05-25 MED ORDER — HYDROMORPHONE HCL 1 MG/ML IJ SOLN
1.0000 mg | Freq: Once | INTRAMUSCULAR | Status: AC
  Administered 2019-05-25: 23:00:00 1 mg via INTRAVENOUS
  Filled 2019-05-25: qty 1

## 2019-05-25 MED ORDER — SODIUM CHLORIDE 0.9% FLUSH
3.0000 mL | Freq: Once | INTRAVENOUS | Status: AC
  Administered 2019-05-25: 22:00:00 3 mL via INTRAVENOUS

## 2019-05-25 MED ORDER — SODIUM CHLORIDE 0.9 % IV BOLUS
1000.0000 mL | Freq: Once | INTRAVENOUS | Status: AC
  Administered 2019-05-25: 23:00:00 1000 mL via INTRAVENOUS

## 2019-05-25 NOTE — ED Provider Notes (Signed)
[facility] DEPT Provider Note   CSN: [id number] Arrival date & time: 05/25/19  1906    History   Chief Complaint Chief Complaint  Patient presents with  . Abdominal Pain    HPI Gina [name] is a 43 y.o. female.     HPI   Pt is a 43 y/o female with a h/o multiple abdominal surgeries, endometriosis, bipolar disorder, fibromyalgia, who presents to the ED today for eval of abd pain that has been ongoing for several months but seem to worsen last week after visiting her OB/GYN.. Pain located to the lower abdomen and feels like a cramping pain. Pain is severe. She also feels like she is having some bloating.   States she was seen by her OB-GYN on 7/22 for evaluation of her endometriosis.  She had an ultrasound in the office which was concerning for possible diverticulitis.  She was started on Cipro only and has been on it for 4 days.  She states that on the initial day of taking Cipro she had a rash on her back where she had multiple erythematous papules.  She did not have any urticaria.  She did not have any angioedema or wheezing or shortness of breath.  She called her OB/GYN who advised her to continue taking the medication.  She has had no worsening/recurrent rash or any allergic reaction and she has continued taking Cipro since.  States that after starting Cipro she developed diarrhea.  States that it is "profuse ".  Denies any recent antibiotic use prior to Cipro. Has had decreased urine output. Denies fevers, vomiting. Has had some nausea. She denies any dysuria, frequency, or urgency.   Patient states that she is monogamous with her husband and has no concern for STD.  Patient states that the only pain medicine that works for her is Dilaudid and that the only nausea medication that works for her is Phenergan.  She is requesting medicines only through her IV stating that she had "a bad experience last time.  "   Past Medical History:  Diagnosis  Date  . ABDOMINAL WALL HERNIA 02/27/2010  . Abnormal Pap smear   . ALLERGIC RHINITIS 06/02/2007  . ANOREXIA, CHRONIC 08/07/2008  . ANXIETY 09/15/2010  . Arthritis   . BACK PAIN, THORACIC REGION 07/07/2007  . Bipolar disorder ([place])   . CERVICAL RADICULOPATHY 12/11/2008  . Condyloma acuminatum 04/23/2009  . CONSTIPATION 09/15/2010  . DYSPHAGIA UNSPECIFIED 09/09/2009  . 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 Eating disorder   . ENDOMETRIOSIS 12/16/2009  . FATIGUE 06/11/2010  . Fibromyalgia   . GERD 10/23/2009  . Heart palpitations   . HERPES SIMPLEX INFECTION 06/11/2010  . LENTIGO 04/23/2009  . LUNG NODULE 06/17/2010  . Narcotic abuse, continuous ([place])   . Ovarian cyst   . Palpitations 10/23/2009  . Panic disorder   . Renal disorder   . Rheumatoid arthritis(714.0)   . Stricture and stenosis of esophagus 05/13/2010  . TOBACCO ABUSE 08/07/2008  . Urinary tract infection   . UTI (urinary tract infection)     Patient Active Problem List   Diagnosis Date Noted  . Bipolar affective disorder, current episode mixed ([place]) 01/02/2019  . Arthritis 01/02/2019  . Cigarette nicotine dependence without complication 01/74/9449  . Bipolar 1 disorder, mixed, moderate ([facility]) 07/15/2015    Class: Chronic  . 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 Sciatica 01/09/2015  . Reactive hypoglycemia 12/17/2013  . Musculoskeletal malfunction arising from mental factors 04/04/2013  .  Incisional hernia 11/15/2012  . Other postprocedural status(V45.89) 11/15/2012  . Abdominal pain 11/11/2012  . Chronic pain syndrome 11/11/2012  . Disorder of sacrum 11/11/2012  . Heartburn 10/04/2011  . DYSPHAGIA 10/04/2011  . Depression with anxiety 09/15/2010  . CONSTIPATION 09/15/2010  . LUNG NODULE 06/17/2010  . Herpes simplex virus (HSV) infection 06/11/2010  . FATIGUE 06/11/2010  . STRICTURE AND STENOSIS OF ESOPHAGUS 05/13/2010  . ABDOMINAL WALL HERNIA 02/27/2010  . ENDOMETRIOSIS 12/16/2009  . GERD 10/23/2009   . PALPITATIONS 10/23/2009  . 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 CONDYLOMA ACUMINATUM 04/23/2009  . LENTIGO 04/23/2009  . DENTAL PAIN 03/19/2009  . CERVICAL RADICULOPATHY 12/11/2008  . 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 TOBACCO ABUSE 08/07/2008  . BACK PAIN, THORACIC REGION 07/07/2007  . ALLERGIC RHINITIS 06/02/2007    Past Surgical History:  Procedure Laterality Date  . ABDOMINAL SURGERY    . ABDOMINAL WALL MESH  REMOVAL    . ABLATION ON ENDOMETRIOSIS    . CESAREAN SECTION     x2   . DILATION AND CURETTAGE OF UTERUS     x1   . ENDOMETRIAL ABLATION    . esophageal dilatation x 4    . GUM SURGERY    . HERNIA REPAIR     X3  . PARTIAL HYSTERECTOMY       OB History    Gravida  3   Para  2   Term  2   Preterm  0   AB  1   Living  2     SAB  1   TAB  0   Ectopic  0   Multiple  0   Live Births  1            Home Medications    Prior to Admission medications   Medication Sig Start Date End Date Taking? Authorizing Provider  clonazePAM (KLONOPIN) 0.5 MG tablet Take 0.5 mg by mouth 2 (two) times daily.  08/30/18  Yes [provider]  cyclobenzaprine (FLEXERIL) 10 MG tablet Take 10 mg by mouth 3 (three) times daily as needed for muscle spasms.  01/19/19  Yes [provider]  escitalopram (LEXAPRO) 20 MG tablet Take 30 mg by mouth at bedtime. 12/20/18  Yes [provider]  lurasidone (LATUDA) 80 MG TABS tablet Take 80 mg by mouth at bedtime.    Yes [provider]  naloxegol oxalate (MOVANTIK) 25 MG TABS tablet Take 25 mg by mouth daily.   Yes [provider]  oxyCODONE-acetaminophen (PERCOCET) 7.5-325 MG tablet Take 1 tablet by mouth 4 (four) times daily.   Yes [provider]  REXULTI 1 MG TABS Take 1 tablet by mouth daily.  01/19/19  Yes [provider]  valACYclovir (VALTREX) 1000 MG tablet Take 1,000 mg by mouth 2 (two) times daily.   03/08/19  Yes [provider]  diphenhydrAMINE (BENADRYL) 25 MG tablet Take 2 tablets (50 mg total) by mouth every 6 (six) hours as needed for allergies. Patient not taking: Reported on 05/25/2019 02/11/19   Jola Schmidt, MD  famotidine (PEPCID) 20 MG tablet Take 1 tablet (20 mg total) by mouth 2 (two) times daily. Patient not taking: Reported on 05/25/2019 02/11/19   Jola Schmidt, MD  hydrOXYzine (ATARAX/VISTARIL) 25 MG tablet Take 1 tablet (25 mg total) by mouth every 6 (six) hours. Patient not taking: Reported on 05/25/2019 02/13/19   Varney Biles, MD  promethazine (PHENERGAN) 25 MG tablet  Take 1 tablet (25 mg total) by mouth every 6 (six) hours as needed for nausea or vomiting. Patient not taking: Reported on 05/25/2019 01/14/19   Street, [place], PA-C    Family History Family History  Problem Relation Age of Onset  . Depression Mother   . Arthritis Mother   . Hyperlipidemia Father   . Hypertension Father     Social History Social History   Tobacco Use  . Smoking status: Current Every Day Smoker    Packs/day: 1.00    Types: Cigarettes  . Smokeless tobacco: Never Used  Substance Use Topics  . Alcohol use: No  . Drug use: No     Allergies   Lamictal [lamotrigine], Morphine and related, Nicotine, Zofran, Chantix [varenicline tartrate], Codeine, Haldol [haloperidol], Ibuprofen, Lidoderm [lidocaine], Metaxalone, Sulfa antibiotics, Sulfasalazine, Toradol [ketorolac tromethamine], Duloxetine, Gabapentin, Ingrezza [valbenazine tosylate], Methadone, Pregabalin, Zolpidem, Amoxicillin, Aripiprazole, Penicillins, and Valbenazine   Review of Systems Review of Systems  Constitutional: Negative for fever.  HENT: Negative for ear pain and sore throat.   Eyes: Negative for visual disturbance.  Respiratory: Negative for cough and shortness of breath.   Cardiovascular: Negative for chest pain.  Gastrointestinal: Positive for abdominal pain and diarrhea. Negative for blood in stool,  constipation and vomiting.  Genitourinary: Negative for dysuria and hematuria.  Musculoskeletal: Negative for back pain.  Skin: Negative for rash.  Neurological: Negative for headaches.  All other systems reviewed and are negative.   Physical Exam Updated Vital Signs BP 100/68 (BP Location: Left Arm)   Pulse 74   Temp 98.1 F (36.7 C) (Rectal)   Resp 16   Ht 5\' 7"  (1.702 m)   Wt 65.8 kg   LMP 01/01/2004   SpO2 99%   BMI 22.71 kg/m   Physical Exam Vitals signs and nursing note reviewed.  Constitutional:      General: She is not in acute distress.    Appearance: She is well-developed.  HENT:     Head: Normocephalic and atraumatic.  Eyes:     Conjunctiva/sclera: Conjunctivae normal.  Neck:     Musculoskeletal: Neck supple.  Cardiovascular:     Rate and Rhythm: Normal rate and regular rhythm.     Heart sounds: No murmur.  Pulmonary:     Effort: Pulmonary effort is normal. No respiratory distress.     Breath sounds: Normal breath sounds.  Abdominal:     General: Bowel sounds are normal.     Palpations: Abdomen is soft.     Tenderness: There is abdominal tenderness in the right lower quadrant, suprapubic area and left lower quadrant. There is left CVA tenderness and guarding (voluntary). There is no right CVA tenderness or rebound.  Genitourinary:    Comments: Exam performed by Rodney Booze,  exam chaperoned Date: 05/26/2019 Pelvic exam: normal external genitalia without evidence of trauma. VULVA: normal appearing vulva with no masses, tenderness or lesion. VAGINA: normal appearing vagina with normal color and, no lesions. Significant amount white vaginal discharge in the vaginal vault. CERVIX: cervix not visualized.   ADNEXA: normal adnexa in size, nontender and no masses UTERUS: uterus is normal size, shape, consistency and nontender.  Skin:    General: Skin is warm and dry.  Neurological:     Mental Status: She is alert.      ED Treatments / Results  Labs  (all labs ordered are listed, but only abnormal results are displayed) Labs Reviewed  WET PREP, GENITAL - Abnormal; Notable for the following components:  Result Value   WBC, Wet Prep HPF POC FEW (*)    All other components within normal limits  COMPREHENSIVE METABOLIC PANEL - Abnormal; Notable for the following components:   Calcium 8.8 (*)    All other components within normal limits  CBC - Abnormal; Notable for the following components:   Hemoglobin 15.2 (*)    All other components within normal limits  C DIFFICILE QUICK SCREEN W PCR REFLEX  LIPASE, BLOOD  URINALYSIS, ROUTINE W REFLEX MICROSCOPIC  I-STAT BETA HCG BLOOD, ED ([facility], [facility], [facility] ONLY)    EKG None  Radiology No results found.  Procedures Procedures (including critical care time)  Medications Ordered in ED Medications  sodium chloride (PF) 0.9 % injection (has no administration in time range)  sodium chloride flush (NS) 0.9 % injection 3 mL (3 mLs Intravenous Given 05/25/19 2211)  HYDROmorphone (DILAUDID) injection 1 mg (1 mg Intravenous Given 05/25/19 2329)  promethazine (PHENERGAN) injection 6.25 mg (6.25 mg Intravenous Given 05/25/19 2329)  sodium chloride 0.9 % bolus 1,000 mL ( Intravenous Stopped 05/26/19 0039)  LORazepam (ATIVAN) injection 0.25 mg (0.25 mg Intravenous Given 05/26/19 0033)  iohexol (OMNIPAQUE) 300 MG/ML solution 100 mL (100 mLs Intravenous Contrast Given 05/26/19 0045)     Initial Impression / Assessment and Plan / ED Course  I have reviewed the triage vital signs and the nursing notes.  Pertinent labs & imaging results that were available during my care of the patient were reviewed by me and considered in my medical decision making (see chart for details).    Final Clinical Impressions(s) / ED Diagnoses   Final diagnoses:  None   43 year old female with history of endometriosis, presenting with lower abdominal pain that has been chronic for the last several months but worsened about a  week ago after being seen by her OB/GYN and diagnosed with diverticulitis.  Started on Cipro 4 days ago.  Has had no improvement of symptoms.  Has had no fevers.  Has had diarrhea.  No vomiting.  No urinary complaints.  Abd pain noted to the lower abd bilat, most specifically to the LLQ. Pelvic exam with no adnexal tenderness bilaterally.  Cervix was not visualized.  Patient has history of hysterectomy.  She does have some discharge in the vaginal or genital vault.  She adamantly denies any possibility of STDs and adamantly declines STD testing even though it was recommended given her copious discharge.  Wet prep was obtained.  12:23 PM On reassessment for pelvic exam, pt is requesting ativan and states that "it is my wonder drug". States that she takes flexeril at home and she feels really anxious right now.   We will obtain laboratory work and CT scan to rule out complicated diverticulitis. CBC is without leukocytosis, mildly elevated hemoglobin.  May be secondary to dehydration. CMP with normal electrolytes.  Liver and kidney function are normal. Lipase is negative. Beta hCG is negative. UA does not show any signs of infection.  Patient was given pain medication and nausea medication.  She was given IV fluids.  At shift change, CT scan is pending.  Care signed out to [facility], PA-C with plan to follow-up on pending CT scan.  If negative, patient likely safe for discharge.  If diverticulitis present, will treat with Cipro/Flagyl.  ED Discharge Orders    None       Bishop Dublin 05/26/19 0058    Fatima Blank, MD 05/28/19 [id number]

## 2019-05-25 NOTE — ED Triage Notes (Signed)
Patient is complaining of abdominal pain. Patient states she was just dx with diverticulitis. Patient is also complaining of dehydration. Patient states all this started Wednesday.

## 2019-05-25 NOTE — Progress Notes (Signed)
Cramping, Diarrhea and bloated. Was dx with Diverticulitist on Tue at [facility] with a ultrasound and Cipro.  Pain worse today than Tuesday and yesterday.

## 2019-05-25 NOTE — Patient Instructions (Signed)
° ° ° °  If you have lab work done today you will be contacted with your lab results within the next 2 weeks.  If you have not heard from us then please contact us. The fastest way to get your results is to register for My Chart. ° ° °IF you received an x-ray today, you will receive an invoice from [facility]. Please contact [facility] at [phone] with questions or concerns regarding your invoice.  ° °IF you received labwork today, you will receive an invoice from [facility]. Please contact [facility] at [phone] with questions or concerns regarding your invoice.  ° °Our billing staff will not be able to assist you with questions regarding bills from these companies. ° °You will be contacted with the lab results as soon as they are available. The fastest way to get your results is to activate your My Chart account. Instructions are located on the last page of this paperwork. If you have not heard from us regarding the results in 2 weeks, please contact this office. °  ° ° ° °

## 2019-05-25 NOTE — Progress Notes (Signed)
Virtual Visit Note  I connected with patient on 05/25/19 at 553pm by phone and verified that I am speaking with the correct person using two identifiers. Gina [name] is currently located at home and patient and husband is currently with them during visit. The provider, Rutherford Guys, MD is located in their office at time of visit.  I discussed the limitations, risks, security and privacy concerns of performing an evaluation and management service by telephone and the availability of in person appointments. I also discussed with the patient that there may be a patient responsible charge related to this service. The patient expressed understanding and agreed to proceed.   CC: cramping, diarrhea, bloating  HPI Was having really bad abd pain, sharp pain worse Left lower abdomen, for 2-3 months, intermittent that was intensifying, she thought it was endometriosis so she saw her ob gyn ? Had pelvic US at the office and diagnosed with diverticulitis and endometriosis right ovary  Started on cipro 4 days ago - having rash since she started, told by ob gyn to continue taking medication  She is not getting better Today has been her worse, pain and bloating are worse Very distended and cramps that radiate to her back Having explosive diarrhea, none for past several past hours, not sure if any blood Not having any fever or chills Nausea but not vomiting Has only urinated once today Has been drinking ensure and gatorade for past several days   Allergies  Allergen Reactions  . Lamictal [Lamotrigine] Anaphylaxis and Other (See Comments)    STEVEN JOHNSON'S SYNDROME  . Morphine And Related Other (See Comments)    Makes pt "very mean" "I get mean" Makes pt "very mean" Makes pt "very mean"  . Nicotine Swelling and Palpitations    Other reaction(s): Respiratory Distress (ALLERGY/intolerance), Swelling (ALLERGY/intolerance), Tachycardia / Palpitations  (intolerance) Patches, lozenges  Patches caused palpations lozenges throat swelled  . Zofran Anaphylaxis  . Chantix [Varenicline Tartrate] Other (See Comments)     Diaphoresis, sweating.  Night terrors.  "went crazy"  . Codeine Nausea And Vomiting  . Haldol [Haloperidol] Itching, Rash and Other (See Comments)    FLUSHING  . Ibuprofen Nausea And Vomiting  . Lidoderm [Lidocaine] Other (See Comments)    DOESN'T WORK FOR PATIENT  . Metaxalone Other (See Comments)    Pt does not remember reaction, REAL BAD REACTION  . Sulfa Antibiotics Diarrhea and Other (See Comments)    GI PAINS ALSO  . Sulfasalazine Diarrhea    GI PAINS  . Toradol [Ketorolac Tromethamine] Nausea And Vomiting  . Duloxetine Other (See Comments)    unk  . Gabapentin Nausea And Vomiting  . Ingrezza [Valbenazine Tosylate] Hives  . Methadone     seizures  . Pregabalin     unk  . Zolpidem     Mental status changes  . Amoxicillin Rash    Has patient had a PCN reaction causing immediate rash, facial/tongue/throat swelling, SOB or lightheadedness with hypotension: No Has patient had a PCN reaction causing severe rash involving mucus membranes or skin necrosis: No Has patient had a PCN reaction that required hospitalization: No Has patient had a PCN reaction occurring within the last 10 years: No If all of the above answers are "NO", then may proceed with Cephalosporin use.   . Aripiprazole Anxiety    hallucinations  . Penicillins Rash    Other reaction(s): Urticaria / Hives (ALLERGY) Has patient had a PCN reaction causing immediate rash, facial/tongue/throat swelling, SOB  or lightheadedness with hypotension: No Has patient had a PCN reaction causing severe rash involving mucus membranes or skin necrosis: No Has patient had a PCN reaction that required hospitalization: No Has patient had a PCN reaction occurring within the last 10 years: No If all of the above answers are "NO", then may proceed with Cephalosporin use.  Minus Liberty Rash    Prior  to Admission medications   Medication Sig Start Date End Date Taking? Authorizing Provider  clonazePAM (KLONOPIN) 0.5 MG tablet Take 0.5 mg by mouth 2 (two) times daily.  08/30/18  Yes [provider]  cyclobenzaprine (FLEXERIL) 10 MG tablet Take 10 mg by mouth 3 (three) times daily as needed for muscle spasms.  01/19/19  Yes [provider]  escitalopram (LEXAPRO) 20 MG tablet Take 30 mg by mouth at bedtime. 12/20/18  Yes [provider]  lurasidone (LATUDA) 80 MG TABS tablet Take 80 mg by mouth at bedtime.    Yes [provider]  naloxegol oxalate (MOVANTIK) 25 MG TABS tablet Take 25 mg by mouth daily.   Yes [provider]  oxyCODONE-acetaminophen (PERCOCET) 7.5-325 MG tablet Take 1 tablet by mouth 4 (four) times daily.   Yes [provider]  REXULTI 1 MG TABS Take 1 tablet by mouth daily.  01/19/19  Yes [provider]  valACYclovir (VALTREX) 1000 MG tablet Take 1,000 mg by mouth 2 (two) times daily.  03/08/19  Yes [provider]  ciprofloxacin (CIPRO) 500 MG tablet TK 1 T PO  BID 05/22/19   [provider]  diphenhydrAMINE (BENADRYL) 25 MG tablet Take 2 tablets (50 mg total) by mouth every 6 (six) hours as needed for allergies. Patient not taking: Reported on 05/25/2019 02/11/19   Jola Schmidt, MD  famotidine (PEPCID) 20 MG tablet Take 1 tablet (20 mg total) by mouth 2 (two) times daily. Patient not taking: Reported on 05/25/2019 02/11/19   Jola Schmidt, MD  hydrOXYzine (ATARAX/VISTARIL) 25 MG tablet Take 1 tablet (25 mg total) by mouth every 6 (six) hours. Patient not taking: Reported on 05/25/2019 02/13/19   Varney Biles, MD  promethazine (PHENERGAN) 25 MG tablet Take 1 tablet (25 mg total) by mouth every 6 (six) hours as needed for nausea or vomiting. Patient not taking: Reported on 05/25/2019 01/14/19   Street, [place], [place]    Past Medical History:  Diagnosis Date  . ABDOMINAL WALL HERNIA 02/27/2010  . Abnormal  Pap smear   . ALLERGIC RHINITIS 06/02/2007  . ANOREXIA, CHRONIC 08/07/2008  . ANXIETY 09/15/2010  . Arthritis   . BACK PAIN, THORACIC REGION 07/07/2007  . Bipolar disorder ([place])   . CERVICAL RADICULOPATHY 12/11/2008  . Condyloma acuminatum 04/23/2009  . CONSTIPATION 09/15/2010  . DYSPHAGIA UNSPECIFIED 09/09/2009  . 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 Eating disorder   . ENDOMETRIOSIS 12/16/2009  . FATIGUE 06/11/2010  . Fibromyalgia   . GERD 10/23/2009  . Heart palpitations   . HERPES SIMPLEX INFECTION 06/11/2010  . LENTIGO 04/23/2009  . LUNG NODULE 06/17/2010  . Narcotic abuse, continuous ([place])   . Ovarian cyst   . Palpitations 10/23/2009  . Panic disorder   . Renal disorder   . Rheumatoid arthritis(714.0)   . Stricture and stenosis of esophagus 05/13/2010  . TOBACCO ABUSE 08/07/2008  . Urinary tract infection   . UTI (urinary tract infection)     Past Surgical History:  Procedure Laterality Date  . ABDOMINAL SURGERY    . ABDOMINAL WALL MESH  REMOVAL    .  ABLATION ON ENDOMETRIOSIS    . CESAREAN SECTION     x2   . DILATION AND CURETTAGE OF UTERUS     x1   . ENDOMETRIAL ABLATION    . esophageal dilatation x 4    . GUM SURGERY    . HERNIA REPAIR     X3  . PARTIAL HYSTERECTOMY      Social History   Tobacco Use  . Smoking status: Current Every Day Smoker    Packs/day: 1.00    Types: Cigarettes  . Smokeless tobacco: Never Used  Substance Use Topics  . Alcohol use: No    Family History  Problem Relation Age of Onset  . Depression Mother   . Arthritis Mother   . Hyperlipidemia Father   . Hypertension Father     ROS Per hpi   Objective  Vitals as reported by the patient: none   ASSESSMENT and PLAN  1. Diverticulitis of colon Failed outpatient treatment. Advised ER evaluation, r/o abscess. Patient agreed.   FOLLOW-UP: prn   The above assessment and management plan was discussed with the patient. The patient verbalized understanding of and has agreed to the  management plan. Patient is aware to call the clinic if symptoms persist or worsen. Patient is aware when to return to the clinic for a follow-up visit. Patient educated on when it is appropriate to go to the emergency department.    I provided 15 minutes of non-face-to-face time during this encounter.  Rutherford Guys, MD Primary Care at [place], [place] [address] Ph.  [phone] Fax [phone]

## 2019-05-26 ENCOUNTER — Encounter (HOSPITAL_COMMUNITY): Payer: Self-pay

## 2019-05-26 ENCOUNTER — Emergency Department (HOSPITAL_COMMUNITY): Payer: Medicare Other

## 2019-05-26 DIAGNOSIS — R1032 Left lower quadrant pain: Secondary | ICD-10-CM | POA: Diagnosis not present

## 2019-05-26 LAB — URINALYSIS, ROUTINE W REFLEX MICROSCOPIC
Bilirubin Urine: NEGATIVE
Glucose, UA: NEGATIVE mg/dL
Hgb urine dipstick: NEGATIVE
Ketones, ur: NEGATIVE mg/dL
Leukocytes,Ua: NEGATIVE
Nitrite: NEGATIVE
Protein, ur: NEGATIVE mg/dL
Specific Gravity, Urine: 1.013 (ref 1.005–1.030)
pH: 6 (ref 5.0–8.0)

## 2019-05-26 LAB — WET PREP, GENITAL
Clue Cells Wet Prep HPF POC: NONE SEEN
Sperm: NONE SEEN
Trich, Wet Prep: NONE SEEN
Yeast Wet Prep HPF POC: NONE SEEN

## 2019-05-26 MED ORDER — HYDROMORPHONE HCL 1 MG/ML IJ SOLN
1.0000 mg | Freq: Once | INTRAMUSCULAR | Status: AC
  Administered 2019-05-26: 01:00:00 1 mg via INTRAVENOUS
  Filled 2019-05-26: qty 1

## 2019-05-26 MED ORDER — SODIUM CHLORIDE (PF) 0.9 % IJ SOLN
INTRAMUSCULAR | Status: AC
  Filled 2019-05-26: qty 50

## 2019-05-26 MED ORDER — IOHEXOL 300 MG/ML  SOLN
100.0000 mL | Freq: Once | INTRAMUSCULAR | Status: AC | PRN
  Administered 2019-05-26: 01:00:00 100 mL via INTRAVENOUS

## 2019-05-26 MED ORDER — LORAZEPAM 2 MG/ML IJ SOLN
0.2500 mg | Freq: Once | INTRAMUSCULAR | Status: AC
  Administered 2019-05-26: 0.25 mg via INTRAVENOUS
  Filled 2019-05-26: qty 1

## 2019-05-26 NOTE — Discharge Instructions (Signed)
Your work-up in the emergency department today was reassuring.  Your CT scan was negative and did not suggest diverticulitis.  We recommend continued follow-up with your primary care doctor as well as GI.  You may return for any new or concerning symptoms.

## 2019-05-26 NOTE — ED Provider Notes (Signed)
2:28 AM Patient care assumed at shift change from [place], [place].  Patient presenting for evaluation of abdominal pain which has been ongoing over the past several months, but worse over the past week.  She was started on ciprofloxacin given concern for diverticulitis.  CT scan pending at shift change.  Imaging reviewed which shows no acute process in the abdomen or pelvis.  Specifically, no evidence of diverticulitis.  Her labs have been reviewed.  These are reassuring.  She is afebrile and without leukocytosis.  Doubt acute infectious etiology.  She has been encouraged to continue follow-up with her primary care doctor.  The patient states that she is pending follow-up with GI as well.  Return precautions discussed and provided. Patient discharged in stable condition with no unaddressed concerns.   Results for orders placed or performed during the hospital encounter of 05/25/19  Wet prep, genital   Specimen: Cervix  Result Value Ref Range   Yeast Wet Prep HPF POC NONE SEEN NONE SEEN   Trich, Wet Prep NONE SEEN NONE SEEN   Clue Cells Wet Prep HPF POC NONE SEEN NONE SEEN   WBC, Wet Prep HPF POC FEW (A) NONE SEEN   Sperm NONE SEEN   Lipase, blood  Result Value Ref Range   Lipase 27 11 - 51 U/L  Comprehensive metabolic panel  Result Value Ref Range   Sodium 138 135 - 145 mmol/L   Potassium 4.1 3.5 - 5.1 mmol/L   Chloride 103 98 - 111 mmol/L   CO2 26 22 - 32 mmol/L   Glucose, Bld 93 70 - 99 mg/dL   BUN 16 6 - 20 mg/dL   Creatinine, Ser 0.57 0.44 - 1.00 mg/dL   Calcium 8.8 (L) 8.9 - 10.3 mg/dL   Total Protein 6.6 6.5 - 8.1 g/dL   Albumin 4.1 3.5 - 5.0 g/dL   AST 21 15 - 41 U/L   ALT 32 0 - 44 U/L   Alkaline Phosphatase 82 38 - 126 U/L   Total Bilirubin 0.5 0.3 - 1.2 mg/dL   GFR calc non Af Amer >60 >60 mL/min   GFR calc Af Amer >60 >60 mL/min   Anion gap 9 5 - 15  CBC  Result Value Ref Range   WBC 7.8 4.0 - 10.5 K/uL   RBC 4.53 3.87 - 5.11 MIL/uL   Hemoglobin 15.2 (H) 12.0 - 15.0  g/dL   HCT 45.2 36.0 - 46.0 %   MCV 99.8 80.0 - 100.0 fL   MCH 33.6 26.0 - 34.0 pg   MCHC 33.6 30.0 - 36.0 g/dL   RDW 11.9 11.5 - 15.5 %   Platelets 231 150 - 400 K/uL   nRBC 0.0 0.0 - 0.2 %  Urinalysis, Routine w reflex microscopic  Result Value Ref Range   Color, Urine YELLOW YELLOW   APPearance CLEAR CLEAR   Specific Gravity, Urine 1.013 1.005 - 1.030   pH 6.0 5.0 - 8.0   Glucose, UA NEGATIVE NEGATIVE mg/dL   Hgb urine dipstick NEGATIVE NEGATIVE   Bilirubin Urine NEGATIVE NEGATIVE   Ketones, ur NEGATIVE NEGATIVE mg/dL   Protein, ur NEGATIVE NEGATIVE mg/dL   Nitrite NEGATIVE NEGATIVE   Leukocytes,Ua NEGATIVE NEGATIVE  I-Stat beta hCG blood, ED  Result Value Ref Range   I-stat hCG, quantitative <5.0 <5 mIU/mL   Comment 3           Ct Abdomen Pelvis W Contrast  Result Date: 05/26/2019 CLINICAL DATA:  43 year old female with left lower  quadrant abdominal pain. EXAM: CT ABDOMEN AND PELVIS WITH CONTRAST TECHNIQUE: Multidetector CT imaging of the abdomen and pelvis was performed using the standard protocol following bolus administration of intravenous contrast. CONTRAST:  141mL OMNIPAQUE IOHEXOL 300 MG/ML  SOLN COMPARISON:  CT abdomen pelvis dated 07/02/2015 FINDINGS: Lower chest: The visualized lung bases are clear. No intra-abdominal free air or free fluid. Hepatobiliary: No focal liver abnormality is seen. No gallstones, gallbladder wall thickening, or biliary dilatation. Pancreas: Unremarkable. No pancreatic ductal dilatation or surrounding inflammatory changes. Spleen: Unremarkable. Adrenals/Urinary Tract: The adrenal glands are unremarkable. There is no hydronephrosis on either side. There is symmetric enhancement and excretion of contrast by both kidneys. Subcentimeter right renal upper pole hypodense focus is too small to characterize. 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 The urinary bladder is unremarkable. Stomach/Bowel: There is  moderate stool throughout the colon. There is no bowel obstruction or active inflammation. The appendix is normal. Vascular/Lymphatic: No significant vascular findings are present. No enlarged abdominal or pelvic lymph nodes. Reproductive: Hysterectomy. There is a 13 mm high attenuating focus in the right ovary which is not characterized by this CT but may represent a small partially calcified or proteinaceous corpus albicans. This was present on the prior CT. Ultrasound may provide better characterization if clinically indicated. The left ovary is grossly unremarkable. Other: Midline vertical anterior abdominal wall incisional scar. Musculoskeletal: No acute or significant osseous findings. IMPRESSION: No acute intra-abdominal or pelvic pathology. No bowel obstruction or active inflammation. Normal appendix. Electronically Signed   By: Anner Crete M.D.   On: 05/26/2019 01:07      Antonietta Breach, PA-C 05/26/19 0229    Fatima Blank, MD 05/28/19 [phone]

## 2019-05-26 NOTE — ED Notes (Signed)
Patient transported to CT 

## 2019-05-29 DIAGNOSIS — R1032 Left lower quadrant pain: Secondary | ICD-10-CM | POA: Diagnosis not present

## 2019-05-31 DIAGNOSIS — G5703 Lesion of sciatic nerve, bilateral lower limbs: Secondary | ICD-10-CM | POA: Diagnosis not present

## 2019-05-31 DIAGNOSIS — G894 Chronic pain syndrome: Secondary | ICD-10-CM | POA: Diagnosis not present

## 2019-05-31 DIAGNOSIS — M47816 Spondylosis without myelopathy or radiculopathy, lumbar region: Secondary | ICD-10-CM | POA: Diagnosis not present

## 2019-06-01 ENCOUNTER — Other Ambulatory Visit: Payer: Self-pay

## 2019-06-01 NOTE — Patient Outreach (Signed)
[facility]) Care Management  06/01/2019  Gina [name] 06-29-1976 [id number]  TELEPHONE SCREENING Referral date:05/10/19 Referral source:utilization management Referral reason :ER Visits for sciatica Insurance:[employer]  Case Closure:  No response after 3 telephone calls and outreach letter attempt.  PLAN: RNCM will close patient due to being unable to reach.  RNCM will send closure notification to patient's primary MD   Quinn Plowman RN,BSN,CCM [facility] Telephonic  [phone]

## 2019-06-03 ENCOUNTER — Emergency Department (HOSPITAL_COMMUNITY)
Admission: EM | Admit: 2019-06-03 | Discharge: 2019-06-03 | Disposition: A | Discharge status: Home / Self Care | Payer: Medicare Other | Attending: Emergency Medicine | Admitting: Emergency Medicine

## 2019-06-03 ENCOUNTER — Encounter (HOSPITAL_COMMUNITY): Payer: Self-pay

## 2019-06-03 ENCOUNTER — Other Ambulatory Visit: Payer: Self-pay

## 2019-06-03 DIAGNOSIS — R110 Nausea: Secondary | ICD-10-CM | POA: Diagnosis not present

## 2019-06-03 DIAGNOSIS — M5432 Sciatica, left side: Secondary | ICD-10-CM | POA: Insufficient documentation

## 2019-06-03 DIAGNOSIS — I959 Hypotension, unspecified: Secondary | ICD-10-CM | POA: Diagnosis not present

## 2019-06-03 DIAGNOSIS — F17210 Nicotine dependence, cigarettes, uncomplicated: Secondary | ICD-10-CM | POA: Insufficient documentation

## 2019-06-03 DIAGNOSIS — M5442 Lumbago with sciatica, left side: Secondary | ICD-10-CM | POA: Diagnosis not present

## 2019-06-03 DIAGNOSIS — Z79899 Other long term (current) drug therapy: Secondary | ICD-10-CM | POA: Diagnosis not present

## 2019-06-03 DIAGNOSIS — R1111 Vomiting without nausea: Secondary | ICD-10-CM | POA: Diagnosis not present

## 2019-06-03 DIAGNOSIS — M5489 Other dorsalgia: Secondary | ICD-10-CM | POA: Diagnosis not present

## 2019-06-03 MED ORDER — KETOROLAC TROMETHAMINE 15 MG/ML IJ SOLN: 15.0000 mg | Freq: Once | INTRAMUSCULAR | Status: DC

## 2019-06-03 MED ORDER — HYDROMORPHONE HCL 1 MG/ML IJ SOLN
1.0000 mg | Freq: Once | INTRAMUSCULAR | Status: AC
  Administered 2019-06-03: 1 mg via INTRAVENOUS
  Filled 2019-06-03: qty 1

## 2019-06-03 MED ORDER — DEXAMETHASONE SODIUM PHOSPHATE 10 MG/ML IJ SOLN
10.0000 mg | Freq: Once | INTRAMUSCULAR | Status: AC
  Administered 2019-06-03: 20:00:00 10 mg via INTRAVENOUS
  Filled 2019-06-03: qty 1

## 2019-06-03 MED ORDER — DIAZEPAM 5 MG PO TABS
5.0000 mg | ORAL_TABLET | Freq: Once | ORAL | Status: AC
  Administered 2019-06-03: 5 mg via ORAL
  Filled 2019-06-03: qty 1

## 2019-06-03 MED ORDER — PROMETHAZINE HCL 25 MG/ML IJ SOLN
12.5000 mg | Freq: Once | INTRAMUSCULAR | Status: AC
  Administered 2019-06-03: 12.5 mg via INTRAVENOUS
  Filled 2019-06-03: qty 1

## 2019-06-03 MED ORDER — KETAMINE HCL 50 MG/5ML IJ SOSY
0.3000 mg/kg | PREFILLED_SYRINGE | Freq: Once | INTRAMUSCULAR | Status: AC
  Administered 2019-06-03: 21:00:00 20 mg via INTRAVENOUS
  Filled 2019-06-03: qty 5

## 2019-06-03 NOTE — ED Triage Notes (Signed)
EMS reports from home called out for back pain.  BP 120/60 HR 100 RR 20 Sp02 100  22 Left Hand 168mcg Fentanyl enroute

## 2019-06-03 NOTE — ED Provider Notes (Signed)
[facility] DEPT Provider Note   CSN: [id number] Arrival date & time: 06/03/19  1752     History   Chief Complaint Chief Complaint  Patient presents with  . Back Pain    HPI Gina [name] is a 43 y.o. female.     HPI  43 year old comes in a chief complaint of sciatica pain. Patient reports that she started having pain in her back yesterday.  Pain is radiating down the left lower extremity.  She is reporting associated numbness.  She thinks that she " overdid" working yesterday.  Pt has no associated numbness, weakness, urinary incontinence, urinary retention, bowel incontinence, pins and needle sensation in the perineal area.  Past Medical History:  Diagnosis Date  . ABDOMINAL WALL HERNIA 02/27/2010  . Abnormal Pap smear   . ALLERGIC RHINITIS 06/02/2007  . ANOREXIA, CHRONIC 08/07/2008  . ANXIETY 09/15/2010  . Arthritis   . BACK PAIN, THORACIC REGION 07/07/2007  . Bipolar disorder ([place])   . CERVICAL RADICULOPATHY 12/11/2008  . Condyloma acuminatum 04/23/2009  . CONSTIPATION 09/15/2010  . DYSPHAGIA UNSPECIFIED 09/09/2009  . 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 Eating disorder   . ENDOMETRIOSIS 12/16/2009  . FATIGUE 06/11/2010  . Fibromyalgia   . GERD 10/23/2009  . Heart palpitations   . HERPES SIMPLEX INFECTION 06/11/2010  . LENTIGO 04/23/2009  . LUNG NODULE 06/17/2010  . Narcotic abuse, continuous ([place])   . Ovarian cyst   . Palpitations 10/23/2009  . Panic disorder   . Renal disorder   . Rheumatoid arthritis(714.0)   . Stricture and stenosis of esophagus 05/13/2010  . TOBACCO ABUSE 08/07/2008  . Urinary tract infection   . UTI (urinary tract infection)     Patient Active Problem List   Diagnosis Date Noted  . Bipolar affective disorder, current episode mixed ([place]) 01/02/2019  . Arthritis 01/02/2019  . Cigarette nicotine dependence without complication 35/45/6256  . Bipolar 1 disorder, mixed, moderate ([facility]) 07/15/2015    Class: Chronic   . 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 Sciatica 01/09/2015  . Reactive hypoglycemia 12/17/2013  . Musculoskeletal malfunction arising from mental factors 04/04/2013  . Incisional hernia 11/15/2012  . Other postprocedural status(V45.89) 11/15/2012  . Abdominal pain 11/11/2012  . Chronic pain syndrome 11/11/2012  . Disorder of sacrum 11/11/2012  . Heartburn 10/04/2011  . DYSPHAGIA 10/04/2011  . Depression with anxiety 09/15/2010  . CONSTIPATION 09/15/2010  . LUNG NODULE 06/17/2010  . Herpes simplex virus (HSV) infection 06/11/2010  . FATIGUE 06/11/2010  . STRICTURE AND STENOSIS OF ESOPHAGUS 05/13/2010  . ABDOMINAL WALL HERNIA 02/27/2010  . ENDOMETRIOSIS 12/16/2009  . GERD 10/23/2009  . PALPITATIONS 10/23/2009  . 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 CONDYLOMA ACUMINATUM 04/23/2009  . LENTIGO 04/23/2009  . DENTAL PAIN 03/19/2009  . CERVICAL RADICULOPATHY 12/11/2008  . 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 TOBACCO ABUSE 08/07/2008  . BACK PAIN, THORACIC REGION 07/07/2007  . ALLERGIC RHINITIS 06/02/2007    Past Surgical History:  Procedure Laterality Date  . ABDOMINAL SURGERY    . ABDOMINAL WALL MESH  REMOVAL    . ABLATION ON ENDOMETRIOSIS    . CESAREAN SECTION     x2   . DILATION AND CURETTAGE OF UTERUS     x1   . ENDOMETRIAL ABLATION    . esophageal dilatation x 4    . GUM SURGERY    . HERNIA REPAIR     X3  . PARTIAL HYSTERECTOMY  OB History    Gravida  3   Para  2   Term  2   Preterm  0   AB  1   Living  2     SAB  1   TAB  0   Ectopic  0   Multiple  0   Live Births  1            Home Medications    Prior to Admission medications   Medication Sig Start Date End Date Taking? Authorizing Provider  clonazePAM (KLONOPIN) 0.5 MG tablet Take 0.5 mg by mouth 2 (two) times daily.  08/30/18   [provider]  cyclobenzaprine (FLEXERIL) 10 MG tablet Take 10 mg  by mouth 3 (three) times daily as needed for muscle spasms.  01/19/19   [provider]  diphenhydrAMINE (BENADRYL) 25 MG tablet Take 2 tablets (50 mg total) by mouth every 6 (six) hours as needed for allergies. Patient not taking: Reported on 05/25/2019 02/11/19   Jola Schmidt, MD  escitalopram (LEXAPRO) 20 MG tablet Take 30 mg by mouth at bedtime. 12/20/18   [provider]  famotidine (PEPCID) 20 MG tablet Take 1 tablet (20 mg total) by mouth 2 (two) times daily. Patient not taking: Reported on 05/25/2019 02/11/19   Jola Schmidt, MD  hydrOXYzine (ATARAX/VISTARIL) 25 MG tablet Take 1 tablet (25 mg total) by mouth every 6 (six) hours. Patient not taking: Reported on 05/25/2019 02/13/19   Varney Biles, MD  lurasidone (LATUDA) 80 MG TABS tablet Take 80 mg by mouth at bedtime.     [provider]  naloxegol oxalate (MOVANTIK) 25 MG TABS tablet Take 25 mg by mouth daily.    [provider]  oxyCODONE-acetaminophen (PERCOCET) 7.5-325 MG tablet Take 1 tablet by mouth 4 (four) times daily.    [provider]  promethazine (PHENERGAN) 25 MG tablet Take 1 tablet (25 mg total) by mouth every 6 (six) hours as needed for nausea or vomiting. Patient not taking: Reported on 05/25/2019 01/14/19   Street, [place], PA-C  REXULTI 1 MG TABS Take 1 tablet by mouth daily.  01/19/19   [provider]  valACYclovir (VALTREX) 1000 MG tablet Take 1,000 mg by mouth 2 (two) times daily.  03/08/19   [provider]    Family History Family History  Problem Relation Age of Onset  . Depression Mother   . Arthritis Mother   . Hyperlipidemia Father   . Hypertension Father     Social History Social History   Tobacco Use  . Smoking status: Current Every Day Smoker    Packs/day: 1.00    Types: Cigarettes  . Smokeless tobacco: Never Used  Substance Use Topics  . Alcohol use: No  . Drug use: No     Allergies   Lamictal [lamotrigine], Morphine and related,  Nicotine, Zofran, Chantix [varenicline tartrate], Codeine, Haldol [haloperidol], Ibuprofen, Lidoderm [lidocaine], Metaxalone, Sulfa antibiotics, Sulfasalazine, Toradol [ketorolac tromethamine], Duloxetine, Gabapentin, Ingrezza [valbenazine tosylate], Methadone, Pregabalin, Zolpidem, Amoxicillin, Aripiprazole, Penicillins, and Valbenazine   Review of Systems Review of Systems  Constitutional: Positive for activity change.  Musculoskeletal: Positive for back pain.     Physical Exam Updated Vital Signs BP (!) 88/54 (BP Location: Left Arm) Comment: Simultaneous filing. User may not have seen previous data.  Pulse 97 Comment: Simultaneous filing. User may not have seen previous data.  Temp 98.6 F (37 C) (Oral)   Resp 16   Ht 5\' 7"  (1.702 m)   Wt 65.8  kg   LMP 01/01/2004   SpO2 99% Comment: Simultaneous filing. User may not have seen previous data.  BMI 22.71 kg/m   Physical Exam Vitals signs and nursing note reviewed.  Constitutional:      Appearance: She is well-developed.  HENT:     Head: Normocephalic and atraumatic.  Neck:     Musculoskeletal: Normal range of motion and neck supple.  Cardiovascular:     Rate and Rhythm: Normal rate.  Pulmonary:     Effort: Pulmonary effort is normal.  Abdominal:     General: Bowel sounds are normal.  Musculoskeletal:     Comments: Pt has tenderness over the lumbar region No step offs, no erythema. Able to discriminate between sharp and dull.    Skin:    General: Skin is warm and dry.  Neurological:     Mental Status: She is alert and oriented to person, place, and time.      ED Treatments / Results  Labs (all labs ordered are listed, but only abnormal results are displayed) Labs Reviewed - No data to display  EKG None  Radiology No results found.  Procedures Procedures (including critical care time)  Medications Ordered in ED Medications  HYDROmorphone (DILAUDID) injection 1 mg (1 mg Intravenous Given 06/03/19 1933)   dexamethasone (DECADRON) injection 10 mg (10 mg Intravenous Given 06/03/19 1936)  diazepam (VALIUM) tablet 5 mg (5 mg Oral Given 06/03/19 1944)  promethazine (PHENERGAN) injection 12.5 mg (12.5 mg Intravenous Given 06/03/19 1924)  ketamine 50 mg in normal saline 5 mL (10 mg/mL) syringe (20 mg Intravenous Given 06/03/19 2057)     Initial Impression / Assessment and Plan / ED Course  I have reviewed the triage vital signs and the nursing notes.  Pertinent labs & imaging results that were available during Gina care of the patient were reviewed by me and considered in Gina medical decision making (see chart for details).        DDx includes: - DJD of the back - Spondylitises/ spondylosis - Sciatica - Spinal cord compression - Conus medullaris - Epidural hematoma - Epidural abscess - Lytic/pathologic fracture - Myelitis - Musculoskeletal pain  Complains of sciatica. Severe pain. No red flags suggesting cord compression due to disk herniation.  Demanding iv pain meds (dilaudid, ativan and promethazine). Unfortunately has iv in already. Given iv dilaudid and oral valium. Still complaining of pain, refusing to stand up. IV ketamine - pain dose given.    Final Clinical Impressions(s) / ED Diagnoses   Final diagnoses:  Sciatica of left side    ED Discharge Orders    None       Varney Biles, MD 06/05/19 1225

## 2019-06-05 DIAGNOSIS — M5442 Lumbago with sciatica, left side: Secondary | ICD-10-CM | POA: Diagnosis not present

## 2019-06-12 ENCOUNTER — Encounter (HOSPITAL_COMMUNITY): Payer: Self-pay | Admitting: Family Medicine

## 2019-06-12 ENCOUNTER — Other Ambulatory Visit: Payer: Self-pay

## 2019-06-12 ENCOUNTER — Emergency Department (HOSPITAL_COMMUNITY)
Admission: EM | Admit: 2019-06-12 | Discharge: 2019-06-12 | Discharge status: Against Medical Advice | Payer: Medicare Other | Attending: Emergency Medicine | Admitting: Emergency Medicine

## 2019-06-12 DIAGNOSIS — Z5321 Procedure and treatment not carried out due to patient leaving prior to being seen by health care provider: Secondary | ICD-10-CM | POA: Diagnosis not present

## 2019-06-12 DIAGNOSIS — G8918 Other acute postprocedural pain: Secondary | ICD-10-CM | POA: Diagnosis present

## 2019-06-12 DIAGNOSIS — N736 Female pelvic peritoneal adhesions (postinfective): Secondary | ICD-10-CM | POA: Diagnosis not present

## 2019-06-12 NOTE — ED Notes (Signed)
Registration notified that patient is leaving.

## 2019-06-12 NOTE — ED Triage Notes (Signed)
Patient states she had an ablation on endometriosis today at [facility]. Patient is upset that she is not staying over night and complaining of unmanageable pain. Also, complains of bleeding at the site.

## 2019-06-29 DIAGNOSIS — Z9189 Other specified personal risk factors, not elsewhere classified: Secondary | ICD-10-CM | POA: Diagnosis not present

## 2019-06-29 DIAGNOSIS — M5442 Lumbago with sciatica, left side: Secondary | ICD-10-CM | POA: Diagnosis not present

## 2019-06-29 DIAGNOSIS — G8929 Other chronic pain: Secondary | ICD-10-CM | POA: Diagnosis not present

## 2019-06-30 ENCOUNTER — Emergency Department (HOSPITAL_COMMUNITY): Payer: Medicare Other

## 2019-06-30 ENCOUNTER — Emergency Department (HOSPITAL_COMMUNITY)
Admission: EM | Admit: 2019-06-30 | Discharge: 2019-06-30 | Disposition: A | Discharge status: Home / Self Care | Payer: Medicare Other | Attending: Emergency Medicine | Admitting: Emergency Medicine

## 2019-06-30 ENCOUNTER — Encounter (HOSPITAL_COMMUNITY): Payer: Self-pay | Admitting: Emergency Medicine

## 2019-06-30 ENCOUNTER — Other Ambulatory Visit: Payer: Self-pay

## 2019-06-30 DIAGNOSIS — R110 Nausea: Secondary | ICD-10-CM | POA: Insufficient documentation

## 2019-06-30 DIAGNOSIS — R1030 Lower abdominal pain, unspecified: Secondary | ICD-10-CM | POA: Diagnosis present

## 2019-06-30 DIAGNOSIS — F17210 Nicotine dependence, cigarettes, uncomplicated: Secondary | ICD-10-CM | POA: Diagnosis not present

## 2019-06-30 DIAGNOSIS — Z79899 Other long term (current) drug therapy: Secondary | ICD-10-CM | POA: Diagnosis not present

## 2019-06-30 DIAGNOSIS — G8918 Other acute postprocedural pain: Secondary | ICD-10-CM

## 2019-06-30 DIAGNOSIS — R1084 Generalized abdominal pain: Secondary | ICD-10-CM

## 2019-06-30 DIAGNOSIS — R109 Unspecified abdominal pain: Secondary | ICD-10-CM | POA: Diagnosis not present

## 2019-06-30 LAB — COMPREHENSIVE METABOLIC PANEL
ALT: 19 U/L (ref 0–44)
AST: 18 U/L (ref 15–41)
Albumin: 3.9 g/dL (ref 3.5–5.0)
Alkaline Phosphatase: 69 U/L (ref 38–126)
Anion gap: 10 (ref 5–15)
BUN: 15 mg/dL (ref 6–20)
CO2: 25 mmol/L (ref 22–32)
Calcium: 9.1 mg/dL (ref 8.9–10.3)
Chloride: 105 mmol/L (ref 98–111)
Creatinine, Ser: 0.55 mg/dL (ref 0.44–1.00)
GFR calc Af Amer: >60 mL/min (ref >60)
GFR calc non Af Amer: >60 mL/min (ref >60)
Glucose, Bld: 85 mg/dL (ref 70–99)
Potassium: 4.2 mmol/L (ref 3.5–5.1)
Sodium: 140 mmol/L (ref 135–145)
Total Bilirubin: 0.6 mg/dL (ref 0.3–1.2)
Total Protein: 6.5 g/dL (ref 6.5–8.1)

## 2019-06-30 LAB — CBC WITH DIFFERENTIAL/PLATELET
Abs Immature Granulocytes: 0.02 10*3/uL (ref 0.00–0.07)
Basophils Absolute: 0 10*3/uL (ref 0.0–0.1)
Basophils Relative: 1 %
Eosinophils Absolute: 0.2 10*3/uL (ref 0.0–0.5)
Eosinophils Relative: 3 %
HCT: 44.5 % (ref 36.0–46.0)
Hemoglobin: 15.1 g/dL — ABNORMAL HIGH (ref 12.0–15.0)
Immature Granulocytes: 0 %
Lymphocytes Relative: 31 %
Lymphs Abs: 2.1 10*3/uL (ref 0.7–4.0)
MCH: 33.6 pg (ref 26.0–34.0)
MCHC: 33.9 g/dL (ref 30.0–36.0)
MCV: 98.9 fL (ref 80.0–100.0)
Monocytes Absolute: 0.6 10*3/uL (ref 0.1–1.0)
Monocytes Relative: 9 %
Neutro Abs: 3.7 10*3/uL (ref 1.7–7.7)
Neutrophils Relative %: 56 %
Platelets: 201 10*3/uL (ref 150–400)
RBC: 4.5 MIL/uL (ref 3.87–5.11)
RDW: 12.2 % (ref 11.5–15.5)
WBC: 6.6 10*3/uL (ref 4.0–10.5)
nRBC: 0 % (ref 0.0–0.2)

## 2019-06-30 LAB — URINALYSIS, ROUTINE W REFLEX MICROSCOPIC
Bilirubin Urine: NEGATIVE
Glucose, UA: NEGATIVE mg/dL
Hgb urine dipstick: NEGATIVE
Ketones, ur: NEGATIVE mg/dL
Leukocytes,Ua: NEGATIVE
Nitrite: NEGATIVE
Protein, ur: NEGATIVE mg/dL
Specific Gravity, Urine: 1.045 — ABNORMAL HIGH (ref 1.005–1.030)
pH: 6 (ref 5.0–8.0)

## 2019-06-30 LAB — LIPASE, BLOOD: Lipase: 28 U/L (ref 11–51)

## 2019-06-30 MED ORDER — SODIUM CHLORIDE 0.9 % IV BOLUS
1000.0000 mL | Freq: Once | INTRAVENOUS | Status: AC
  Administered 2019-06-30: 1000 mL via INTRAVENOUS

## 2019-06-30 MED ORDER — HYDROMORPHONE HCL 1 MG/ML IJ SOLN
1.0000 mg | Freq: Once | INTRAMUSCULAR | Status: AC
  Administered 2019-06-30: 14:00:00 1 mg via INTRAVENOUS
  Filled 2019-06-30: qty 1

## 2019-06-30 MED ORDER — IOHEXOL 300 MG/ML  SOLN
100.0000 mL | Freq: Once | INTRAMUSCULAR | Status: AC | PRN
  Administered 2019-06-30: 15:00:00 100 mL via INTRAVENOUS

## 2019-06-30 MED ORDER — PROMETHAZINE HCL 25 MG/ML IJ SOLN
25.0000 mg | Freq: Once | INTRAMUSCULAR | Status: AC
  Administered 2019-06-30: 14:00:00 25 mg via INTRAMUSCULAR
  Filled 2019-06-30: qty 1

## 2019-06-30 MED ORDER — HYDROMORPHONE HCL 1 MG/ML IJ SOLN
1.0000 mg | Freq: Once | INTRAMUSCULAR | Status: AC
  Administered 2019-06-30: 18:00:00 1 mg via INTRAVENOUS
  Filled 2019-06-30: qty 1

## 2019-06-30 MED ORDER — KETAMINE HCL 50 MG/5ML IJ SOSY
10.0000 mg | PREFILLED_SYRINGE | Freq: Once | INTRAMUSCULAR | Status: AC
  Administered 2019-06-30: 16:00:00 10 mg via INTRAVENOUS
  Filled 2019-06-30: qty 5

## 2019-06-30 MED ORDER — SODIUM CHLORIDE (PF) 0.9 % IJ SOLN
INTRAMUSCULAR | Status: AC
  Filled 2019-06-30: qty 50

## 2019-06-30 MED ORDER — LORAZEPAM 2 MG/ML IJ SOLN
1.0000 mg | Freq: Once | INTRAMUSCULAR | Status: AC
  Administered 2019-06-30: 14:00:00 1 mg via INTRAVENOUS
  Filled 2019-06-30: qty 1

## 2019-06-30 NOTE — ED Triage Notes (Signed)
Pt reports that she had surgery on 8/11 and reports that started having severe abd pains and pus coming from one incision yesterday that was completely closed up. reports feels nauseated.

## 2019-06-30 NOTE — ED Provider Notes (Signed)
3:15 PM-checkup from Dr. Tyrone Nine to evaluate for possible postoperative intra-abdominal abnormality.  She recently had endoscopy for endometriosis.  She began having pain and drainage last night.  5:45 PM-CT images read by radiology as no acute intra-abdominal process.  Labs and urinalysis reviewed, she has mild volume depletion.  Chart databank indicates patient receives chronic treatment with high-dose oral narcotic and benzodiazepine.  Patient reevaluated by me at 5:55 PM.  At this time she clearly has some mild drainage from the periumbilical wound which is dried and somewhat discolored.  There is no surrounding erythema, induration, or significant tenderness.  Patient is very anxious about "something is wrong inside, like a ruptured hernia."  I discussed the findings with her and her female friend with her, regarding the CT imaging, labs and overall impression.  We discussed treating the local inflammatory changes of the perirectal area with warm compresses, soap and water and then following up with her gynecologist for another evaluation.  Note that she last saw him for postoperative check, 2 days ago.  Patient has 4 "some more medicine to get me through the night," and indicated that she wanted more hydromorphone.  Will repeat analgesic dose.   Daleen Bo, MD 06/30/19 [phone]

## 2019-06-30 NOTE — ED Provider Notes (Signed)
[place] DEPT Provider Note   CSN: [id number] Arrival date & time: 06/30/19  1237     History   Chief Complaint Chief Complaint  Patient presents with  . Post-op Problem  . Abdominal Pain    HPI Gina [name] is a 43 y.o. female.     43 yo F with a chief complaint of abdominal pain.  She states that she has had drainage from her umbilicus as well.  This started yesterday.  She has a history of chronic abdominal pain that is thought to be secondary to endometriosis.  Has been seen multiple times for this.  Recently had a procedure for it about 18 days ago.  It was performed by Dr. Nori Riis at the surgery center.  Since then she has been fine until last night.  There is some drainage from her umbilicus.  She described as purulent.  Also having severe lower abdominal pain.  No fevers.  The history is provided by the patient.  Abdominal Pain Pain location:  Suprapubic Pain quality: aching and burning   Pain severity:  Moderate Onset quality:  Gradual Duration:  2 days Timing:  Constant Progression:  Worsening Chronicity:  New Relieved by:  Nothing Worsened by:  Nothing Ineffective treatments:  None tried Associated symptoms: nausea   Associated symptoms: no chest pain, no chills, no dysuria, no fever, no shortness of breath and no vomiting     Past Medical History:  Diagnosis Date  . ABDOMINAL WALL HERNIA 02/27/2010  . Abnormal Pap smear   . ALLERGIC RHINITIS 06/02/2007  . ANOREXIA, CHRONIC 08/07/2008  . ANXIETY 09/15/2010  . Arthritis   . BACK PAIN, THORACIC REGION 07/07/2007  . Bipolar disorder ([place])   . CERVICAL RADICULOPATHY 12/11/2008  . Condyloma acuminatum 04/23/2009  . CONSTIPATION 09/15/2010  . DYSPHAGIA UNSPECIFIED 09/09/2009  . 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 Eating disorder   . ENDOMETRIOSIS 12/16/2009  . FATIGUE 06/11/2010  . Fibromyalgia   . GERD 10/23/2009  . Heart palpitations   . HERPES SIMPLEX INFECTION 06/11/2010  .  LENTIGO 04/23/2009  . LUNG NODULE 06/17/2010  . Narcotic abuse, continuous ([place])   . Ovarian cyst   . Palpitations 10/23/2009  . Panic disorder   . Renal disorder   . Rheumatoid arthritis(714.0)   . Stricture and stenosis of esophagus 05/13/2010  . TOBACCO ABUSE 08/07/2008  . Urinary tract infection   . UTI (urinary tract infection)     Patient Active Problem List   Diagnosis Date Noted  . Bipolar affective disorder, current episode mixed ([place]) 01/02/2019  . Arthritis 01/02/2019  . Cigarette nicotine dependence without complication [id number]  . Bipolar 1 disorder, mixed, moderate ([facility]) 07/15/2015    Class: Chronic  . 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 Sciatica 01/09/2015  . Reactive hypoglycemia 12/17/2013  . Musculoskeletal malfunction arising from mental factors 04/04/2013  . Incisional hernia 11/15/2012  . Other postprocedural status(V45.89) 11/15/2012  . Abdominal pain 11/11/2012  . Chronic pain syndrome 11/11/2012  . Disorder of sacrum 11/11/2012  . Heartburn 10/04/2011  . DYSPHAGIA 10/04/2011  . Depression with anxiety 09/15/2010  . CONSTIPATION 09/15/2010  . LUNG NODULE 06/17/2010  . Herpes simplex virus (HSV) infection 06/11/2010  . FATIGUE 06/11/2010  . STRICTURE AND STENOSIS OF ESOPHAGUS 05/13/2010  . ABDOMINAL WALL HERNIA 02/27/2010  . ENDOMETRIOSIS 12/16/2009  . GERD 10/23/2009  . PALPITATIONS 10/23/2009  . 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 CONDYLOMA ACUMINATUM 04/23/2009  .  LENTIGO 04/23/2009  . DENTAL PAIN 03/19/2009  . CERVICAL RADICULOPATHY 12/11/2008  . 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 TOBACCO ABUSE 08/07/2008  . BACK PAIN, THORACIC REGION 07/07/2007  . ALLERGIC RHINITIS 06/02/2007    Past Surgical History:  Procedure Laterality Date  . ABDOMINAL SURGERY    . ABDOMINAL WALL MESH  REMOVAL    . ABLATION ON ENDOMETRIOSIS    . CESAREAN SECTION     x2   . DILATION  AND CURETTAGE OF UTERUS     x1   . ENDOMETRIAL ABLATION    . esophageal dilatation x 4    . GUM SURGERY    . HERNIA REPAIR     X3  . PARTIAL HYSTERECTOMY       OB History    Gravida  3   Para  2   Term  2   Preterm  0   AB  1   Living  2     SAB  1   TAB  0   Ectopic  0   Multiple  0   Live Births  1            Home Medications    Prior to Admission medications   Medication Sig Start Date End Date Taking? Authorizing Provider  clonazePAM (KLONOPIN) 0.5 MG tablet Take 0.5 mg by mouth 2 (two) times daily.  08/30/18  Yes [provider]  cyclobenzaprine (FLEXERIL) 10 MG tablet Take 10 mg by mouth 3 (three) times daily as needed for muscle spasms.  01/19/19  Yes [provider]  escitalopram (LEXAPRO) 20 MG tablet Take 30 mg by mouth at bedtime. 12/20/18  Yes [provider]  hydrOXYzine (ATARAX/VISTARIL) 25 MG tablet Take 1 tablet (25 mg total) by mouth every 6 (six) hours. 02/13/19  Yes Nanavati, Ankit, MD  lurasidone (LATUDA) 80 MG TABS tablet Take 80 mg by mouth at bedtime.    Yes [provider]  naloxegol oxalate (MOVANTIK) 25 MG TABS tablet Take 25 mg by mouth daily as needed (constipation).    Yes [provider]  oxyCODONE-acetaminophen (PERCOCET) 7.5-325 MG tablet Take 1 tablet by mouth 4 (four) times daily.   Yes [provider]  REXULTI 1 MG TABS Take 1 tablet by mouth daily.  01/19/19  Yes [provider]  tiaGABine (GABITRIL) 4 MG tablet Take 4 mg by mouth at bedtime. 06/25/19  Yes [provider]    Family History Family History  Problem Relation Age of Onset  . Depression Mother   . Arthritis Mother   . Hyperlipidemia Father   . Hypertension Father     Social History Social History   Tobacco Use  . Smoking status: Current Every Day Smoker    Packs/day: 1.00    Types: Cigarettes  . Smokeless tobacco: Never Used  Substance Use Topics  . Alcohol use: No  . Drug use: No      Allergies   Lamictal [lamotrigine], Morphine and related, Nicotine, Zofran, Chantix [varenicline tartrate], Codeine, Haldol [haloperidol], Ibuprofen, Lidoderm [lidocaine], Metaxalone, Sulfa antibiotics, Sulfasalazine, Toradol [ketorolac tromethamine], Duloxetine, Gabapentin, Ingrezza [valbenazine tosylate], Methadone, Pregabalin, Zolpidem, Amoxicillin, Aripiprazole, Penicillins, and Valbenazine   Review of Systems Review of Systems  Constitutional: Negative for chills and fever.  HENT: Negative for congestion and rhinorrhea.   Eyes: Negative for redness and visual disturbance.  Respiratory: Negative for shortness of breath and wheezing.   Cardiovascular: Negative for chest pain and palpitations.  Gastrointestinal: Positive for abdominal pain and  nausea. Negative for vomiting.  Genitourinary: Negative for dysuria and urgency.  Musculoskeletal: Negative for arthralgias and myalgias.  Skin: Negative for pallor and wound.  Neurological: Negative for dizziness and headaches.     Physical Exam Updated Vital Signs BP 93/67   Pulse 83   Temp 98.3 F (36.8 C)   Resp 19   LMP 01/01/2004   SpO2 99%   Physical Exam Vitals signs and nursing note reviewed.  Constitutional:      General: She is not in acute distress.    Appearance: She is well-developed. She is not diaphoretic.  HENT:     Head: Normocephalic and atraumatic.  Eyes:     Pupils: Pupils are equal, round, and reactive to light.  Neck:     Musculoskeletal: Normal range of motion and neck supple.  Cardiovascular:     Rate and Rhythm: Normal rate and regular rhythm.     Heart sounds: No murmur. No friction rub. No gallop.   Pulmonary:     Effort: Pulmonary effort is normal.     Breath sounds: No wheezing or rales.  Abdominal:     General: There is no distension.     Palpations: Abdomen is soft.     Tenderness: There is abdominal tenderness (diffuse lower.  Brownish discharge about the umbilitus).  Musculoskeletal:         General: No tenderness.  Skin:    General: Skin is warm and dry.  Neurological:     Mental Status: She is alert and oriented to person, place, and time.  Psychiatric:        Behavior: Behavior normal.      ED Treatments / Results  Labs (all labs ordered are listed, but only abnormal results are displayed) Labs Reviewed  CBC WITH DIFFERENTIAL/PLATELET - Abnormal; Notable for the following components:      Result Value   Hemoglobin 15.1 (*)    All other components within normal limits  COMPREHENSIVE METABOLIC PANEL  LIPASE, BLOOD  URINALYSIS, ROUTINE W REFLEX MICROSCOPIC    EKG None  Radiology No results found.  Procedures Procedures (including critical care time)  Medications Ordered in ED Medications  ketamine 50 mg in normal saline 5 mL (10 mg/mL) syringe (has no administration in time range)  sodium chloride (PF) 0.9 % injection (has no administration in time range)  iohexol (OMNIPAQUE) 300 MG/ML solution 100 mL (has no administration in time range)  HYDROmorphone (DILAUDID) injection 1 mg (1 mg Intravenous Given 06/30/19 1405)  promethazine (PHENERGAN) injection 25 mg (25 mg Intramuscular Given 06/30/19 1404)  sodium chloride 0.9 % bolus 1,000 mL (1,000 mLs Intravenous New Bag/Given 06/30/19 1401)  LORazepam (ATIVAN) injection 1 mg (1 mg Intravenous Given 06/30/19 1428)     Initial Impression / Assessment and Plan / ED Course  I have reviewed the triage vital signs and the nursing notes.  Pertinent labs & imaging results that were available during my care of the patient were reviewed by me and considered in my medical decision making (see chart for details).        43 yo F with a chief complaints of abdominal pain after having a laparoscopic surgery for endometriosis about 18 days ago.  Has severe pain on my exam.  The drainage from the umbilicus looks like it may be from possible fistula formation.  I am unable to express any more from the area.  Will  obtain lab work and CT scan.  No noted leukocytosis.  LFT and lipase  normal.   Turned over to Dr. Eulis Foster, please see his note for further details of care.   The patients results and plan were reviewed and discussed.   Any x-rays performed were independently reviewed by myself.   Differential diagnosis were considered with the presenting HPI.  Medications  ketamine 50 mg in normal saline 5 mL (10 mg/mL) syringe (has no administration in time range)  sodium chloride (PF) 0.9 % injection (has no administration in time range)  iohexol (OMNIPAQUE) 300 MG/ML solution 100 mL (has no administration in time range)  HYDROmorphone (DILAUDID) injection 1 mg (1 mg Intravenous Given 06/30/19 1405)  promethazine (PHENERGAN) injection 25 mg (25 mg Intramuscular Given 06/30/19 1404)  sodium chloride 0.9 % bolus 1,000 mL (1,000 mLs Intravenous New Bag/Given 06/30/19 1401)  LORazepam (ATIVAN) injection 1 mg (1 mg Intravenous Given 06/30/19 1428)    Vitals:   06/30/19 1257 06/30/19 1330 06/30/19 1400  BP: 103/74 99/66 93/67   Pulse: 98 71 83  Resp: 19    Temp: 98.3 F (36.8 C)    SpO2: 97% 98% 99%    Final diagnoses:  Generalized abdominal pain       Final Clinical Impressions(s) / ED Diagnoses   Final diagnoses:  Generalized abdominal pain    ED Discharge Orders    None       Deno Etienne, DO 06/30/19 1514

## 2019-06-30 NOTE — Discharge Instructions (Addendum)
CAT scan did not show any complications from the surgery.  The drainage from the wound around your bellybutton appears to be either inflammation or superficial infection.  Either of these can be treated with a warm compress, 3 times a day for 30 minutes.  Also cleaning the area well with soap and water a couple times a day is helpful.  It is important to follow-up with Dr. Nori Riis who did your surgery to ensure that you continue to improve.  Continue taking your usual medications.

## 2019-07-04 ENCOUNTER — Emergency Department (HOSPITAL_BASED_OUTPATIENT_CLINIC_OR_DEPARTMENT_OTHER): Payer: Medicare Other

## 2019-07-04 ENCOUNTER — Encounter (HOSPITAL_BASED_OUTPATIENT_CLINIC_OR_DEPARTMENT_OTHER): Payer: Self-pay | Admitting: Emergency Medicine

## 2019-07-04 ENCOUNTER — Emergency Department (HOSPITAL_BASED_OUTPATIENT_CLINIC_OR_DEPARTMENT_OTHER)
Admission: EM | Admit: 2019-07-04 | Discharge: 2019-07-05 | Disposition: A | Discharge status: Home / Self Care | Payer: Medicare Other | Attending: Emergency Medicine | Admitting: Emergency Medicine

## 2019-07-04 ENCOUNTER — Other Ambulatory Visit: Payer: Self-pay

## 2019-07-04 DIAGNOSIS — Z79899 Other long term (current) drug therapy: Secondary | ICD-10-CM | POA: Diagnosis not present

## 2019-07-04 DIAGNOSIS — R000 Tachycardia, unspecified: Secondary | ICD-10-CM | POA: Diagnosis not present

## 2019-07-04 DIAGNOSIS — G8929 Other chronic pain: Secondary | ICD-10-CM

## 2019-07-04 DIAGNOSIS — I959 Hypotension, unspecified: Secondary | ICD-10-CM | POA: Diagnosis not present

## 2019-07-04 DIAGNOSIS — R0689 Other abnormalities of breathing: Secondary | ICD-10-CM | POA: Diagnosis not present

## 2019-07-04 DIAGNOSIS — K5903 Drug induced constipation: Secondary | ICD-10-CM | POA: Diagnosis not present

## 2019-07-04 DIAGNOSIS — T402X5A Adverse effect of other opioids, initial encounter: Secondary | ICD-10-CM | POA: Insufficient documentation

## 2019-07-04 DIAGNOSIS — Z79891 Long term (current) use of opiate analgesic: Secondary | ICD-10-CM | POA: Diagnosis not present

## 2019-07-04 DIAGNOSIS — R1033 Periumbilical pain: Secondary | ICD-10-CM | POA: Diagnosis not present

## 2019-07-04 DIAGNOSIS — R109 Unspecified abdominal pain: Secondary | ICD-10-CM | POA: Diagnosis not present

## 2019-07-04 DIAGNOSIS — R1084 Generalized abdominal pain: Secondary | ICD-10-CM | POA: Diagnosis not present

## 2019-07-04 DIAGNOSIS — F17210 Nicotine dependence, cigarettes, uncomplicated: Secondary | ICD-10-CM | POA: Diagnosis not present

## 2019-07-04 LAB — URINALYSIS, ROUTINE W REFLEX MICROSCOPIC
Bilirubin Urine: NEGATIVE
Glucose, UA: NEGATIVE mg/dL
Hgb urine dipstick: NEGATIVE
Ketones, ur: NEGATIVE mg/dL
Leukocytes,Ua: NEGATIVE
Nitrite: NEGATIVE
Protein, ur: NEGATIVE mg/dL
Specific Gravity, Urine: 1.02 (ref 1.005–1.030)
pH: 6 (ref 5.0–8.0)

## 2019-07-04 LAB — RAPID URINE DRUG SCREEN, HOSP PERFORMED
Amphetamines: NOT DETECTED
Barbiturates: NOT DETECTED
Benzodiazepines: NOT DETECTED
Cocaine: NOT DETECTED
Opiates: NOT DETECTED
Tetrahydrocannabinol: POSITIVE — AB

## 2019-07-04 LAB — CBC WITH DIFFERENTIAL/PLATELET
Abs Immature Granulocytes: 0.05 10*3/uL (ref 0.00–0.07)
Basophils Absolute: 0 10*3/uL (ref 0.0–0.1)
Basophils Relative: 0 %
Eosinophils Absolute: 0.2 10*3/uL (ref 0.0–0.5)
Eosinophils Relative: 2 %
HCT: 43.4 % (ref 36.0–46.0)
Hemoglobin: 14.5 g/dL (ref 12.0–15.0)
Immature Granulocytes: 0 %
Lymphocytes Relative: 16 %
Lymphs Abs: 2.2 10*3/uL (ref 0.7–4.0)
MCH: 32.7 pg (ref 26.0–34.0)
MCHC: 33.4 g/dL (ref 30.0–36.0)
MCV: 97.7 fL (ref 80.0–100.0)
Monocytes Absolute: 0.9 10*3/uL (ref 0.1–1.0)
Monocytes Relative: 7 %
Neutro Abs: 10.3 10*3/uL — ABNORMAL HIGH (ref 1.7–7.7)
Neutrophils Relative %: 75 %
Platelets: 216 10*3/uL (ref 150–400)
RBC: 4.44 MIL/uL (ref 3.87–5.11)
RDW: 12.2 % (ref 11.5–15.5)
WBC: 13.6 10*3/uL — ABNORMAL HIGH (ref 4.0–10.5)
nRBC: 0 % (ref 0.0–0.2)

## 2019-07-04 LAB — COMPREHENSIVE METABOLIC PANEL
ALT: 17 U/L (ref 0–44)
AST: 16 U/L (ref 15–41)
Albumin: 4.1 g/dL (ref 3.5–5.0)
Alkaline Phosphatase: 73 U/L (ref 38–126)
Anion gap: 10 (ref 5–15)
BUN: 14 mg/dL (ref 6–20)
CO2: 22 mmol/L (ref 22–32)
Calcium: 8.4 mg/dL — ABNORMAL LOW (ref 8.9–10.3)
Chloride: 104 mmol/L (ref 98–111)
Creatinine, Ser: 0.55 mg/dL (ref 0.44–1.00)
GFR calc Af Amer: >60 mL/min (ref >60)
GFR calc non Af Amer: >60 mL/min (ref >60)
Glucose, Bld: 103 mg/dL — ABNORMAL HIGH (ref 70–99)
Potassium: 3.4 mmol/L — ABNORMAL LOW (ref 3.5–5.1)
Sodium: 136 mmol/L (ref 135–145)
Total Bilirubin: 0.5 mg/dL (ref 0.3–1.2)
Total Protein: 6.5 g/dL (ref 6.5–8.1)

## 2019-07-04 MED ORDER — PROMETHAZINE HCL 25 MG/ML IJ SOLN
25.0000 mg | Freq: Once | INTRAMUSCULAR | Status: AC
  Administered 2019-07-04: 25 mg via INTRAVENOUS
  Filled 2019-07-04: qty 1

## 2019-07-04 MED ORDER — HYDROMORPHONE HCL 1 MG/ML IJ SOLN
1.0000 mg | Freq: Once | INTRAMUSCULAR | Status: AC
  Administered 2019-07-04: 1 mg via INTRAVENOUS
  Filled 2019-07-04: qty 1

## 2019-07-04 NOTE — ED Provider Notes (Signed)
[place] DEPT [facility] Provider Note: Georgena Spurling, MD, FACEP  CSN: [id number] MRN: [id number] ARRIVAL: 07/04/19 at 2138 ROOM: [place]  Abdominal Pain   HISTORY OF PRESENT ILLNESS  07/04/19 11:27 PM Gina [name] is a 43 y.o. female with chronic abdominal pain who underwent outpatient surgery for endometriosis by Dr. Nori Riis on June 12, 2019.  She checked into the [facility] long ED that evening complaining of unmanageable pain but left without being seen.  She was seen again on 06/30/2019 for recurrent pain and had a CT of the abdomen and pelvis which was unremarkable except for some stranding in the subcutaneous fat thought to be related to recent surgery.  She is here with an exacerbation of her pain which began earlier today.  She describes the pain as burning and stretching.  She states the pain feels like something is wrong with the mesh used for previous hernia surgery in 2013.  She has been on oxycodone since 2013 for chronic pain.  She receives 120, 7.5 mg / 325 mg Percocet tablets monthly.  She states she has had associated nausea and vomiting and her abdomen feels distended.  She has been moving her bowels daily and her bowel movements have been normal.   She came by ambulance and was given 100 mcg of fentanyl and 14.7 mg of ketamine IV with transient relief of her pain.    Past Medical History:  Diagnosis Date  . ABDOMINAL WALL HERNIA 02/27/2010  . Abnormal Pap smear   . ALLERGIC RHINITIS 06/02/2007  . ANOREXIA, CHRONIC 08/07/2008  . ANXIETY 09/15/2010  . Arthritis   . BACK PAIN, THORACIC REGION 07/07/2007  . Bipolar disorder ([place])   . CERVICAL RADICULOPATHY 12/11/2008  . Condyloma acuminatum 04/23/2009  . CONSTIPATION 09/15/2010  . DYSPHAGIA UNSPECIFIED 09/09/2009  . 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 Eating disorder   . ENDOMETRIOSIS 12/16/2009  . FATIGUE 06/11/2010  . Fibromyalgia   . GERD 10/23/2009  . Heart palpitations   . HERPES SIMPLEX INFECTION  06/11/2010  . LENTIGO 04/23/2009  . LUNG NODULE 06/17/2010  . Narcotic abuse, continuous ([place])   . Ovarian cyst   . Palpitations 10/23/2009  . Panic disorder   . Renal disorder   . Rheumatoid arthritis(714.0)   . Stricture and stenosis of esophagus 05/13/2010  . TOBACCO ABUSE 08/07/2008  . Urinary tract infection   . UTI (urinary tract infection)     Past Surgical History:  Procedure Laterality Date  . ABDOMINAL SURGERY    . ABDOMINAL WALL MESH  REMOVAL    . ABLATION ON ENDOMETRIOSIS    . CESAREAN SECTION     x2   . DILATION AND CURETTAGE OF UTERUS     x1   . ENDOMETRIAL ABLATION    . esophageal dilatation x 4    . GUM SURGERY    . HERNIA REPAIR     X3  . PARTIAL HYSTERECTOMY      Family History  Problem Relation Age of Onset  . Depression Mother   . Arthritis Mother   . Hyperlipidemia Father   . Hypertension Father     Social History   Tobacco Use  . Smoking status: Current Every Day Smoker    Packs/day: 1.00    Types: Cigarettes  . Smokeless tobacco: Never Used  Substance Use Topics  . Alcohol use: No  . Drug use: No    Prior to Admission medications   Medication Sig Start Date End Date Taking?  Authorizing Provider  clonazePAM (KLONOPIN) 0.5 MG tablet Take 0.5 mg by mouth 2 (two) times daily.  08/30/18   [provider]  cyclobenzaprine (FLEXERIL) 10 MG tablet Take 10 mg by mouth 3 (three) times daily as needed for muscle spasms.  01/19/19   [provider]  escitalopram (LEXAPRO) 20 MG tablet Take 30 mg by mouth at bedtime. 12/20/18   [provider]  hydrOXYzine (ATARAX/VISTARIL) 25 MG tablet Take 1 tablet (25 mg total) by mouth every 6 (six) hours. 02/13/19   Varney Biles, MD  lurasidone (LATUDA) 80 MG TABS tablet Take 80 mg by mouth at bedtime.     [provider]  naloxegol oxalate (MOVANTIK) 25 MG TABS tablet Take 25 mg by mouth daily as needed (constipation).     [provider]  oxyCODONE-acetaminophen  (PERCOCET) 7.5-325 MG tablet Take 1 tablet by mouth 4 (four) times daily.    [provider]  REXULTI 1 MG TABS Take 1 tablet by mouth daily.  01/19/19   [provider]  tiaGABine (GABITRIL) 4 MG tablet Take 4 mg by mouth at bedtime. 06/25/19   [provider]    Allergies Lamictal [lamotrigine], Morphine and related, Nicotine, Zofran, Chantix [varenicline tartrate], Codeine, Haldol [haloperidol], Ibuprofen, Lidoderm [lidocaine], Metaxalone, Sulfa antibiotics, Sulfasalazine, Toradol [ketorolac tromethamine], Duloxetine, Gabapentin, Ingrezza [valbenazine tosylate], Methadone, Pregabalin, Zolpidem, Amoxicillin, Aripiprazole, Penicillins, and Valbenazine   REVIEW OF SYSTEMS  Negative except as noted here or in the History of Present Illness.   PHYSICAL EXAMINATION  Initial Vital Signs Blood pressure 105/79, pulse 78, temperature 98.8 F (37.1 C), temperature source Oral, resp. rate 20, last menstrual period 01/01/2004, SpO2 97 %.  Examination General: Well-developed, well-nourished female in no acute distress; appearance consistent with age of record HENT: normocephalic; atraumatic Eyes: pupils equal, round and reactive to light; extraocular muscles intact Neck: supple Heart: regular rate and rhythm Lungs: clear to auscultation bilaterally Abdomen: soft; mildly distended; diffusely tender even to light touch; well-healing laparoscopy incisions without signs of infection; bowel sounds present Extremities: No deformity; full range of motion; pulses normal Neurologic: Awake, alert and oriented; motor function intact in all extremities and symmetric; no facial droop Skin: Warm and dry Psychiatric: Tearful   RESULTS  Summary of this visit's results, reviewed by myself:   EKG Interpretation  Date/Time:    Ventricular Rate:    PR Interval:    QRS Duration:   QT Interval:    QTC Calculation:   R Axis:     Text Interpretation:        Laboratory Studies:  Results for orders placed or performed during the hospital encounter of 07/04/19 (from the past 24 hour(s))  CBC with Differential/Platelet     Status: Abnormal   Collection Time: 07/04/19  9:48 PM  Result Value Ref Range   WBC 13.6 (H) 4.0 - 10.5 K/uL   RBC 4.44 3.87 - 5.11 MIL/uL   Hemoglobin 14.5 12.0 - 15.0 g/dL   HCT 43.4 36.0 - 46.0 %   MCV 97.7 80.0 - 100.0 fL   MCH 32.7 26.0 - 34.0 pg   MCHC 33.4 30.0 - 36.0 g/dL   RDW 12.2 11.5 - 15.5 %   Platelets 216 150 - 400 K/uL   nRBC 0.0 0.0 - 0.2 %   Neutrophils Relative % 75 %   Neutro Abs 10.3 (H) 1.7 - 7.7 K/uL   Lymphocytes Relative 16 %   Lymphs Abs 2.2 0.7 - 4.0 K/uL   Monocytes Relative 7 %  Monocytes Absolute 0.9 0.1 - 1.0 K/uL   Eosinophils Relative 2 %   Eosinophils Absolute 0.2 0.0 - 0.5 K/uL   Basophils Relative 0 %   Basophils Absolute 0.0 0.0 - 0.1 K/uL   Immature Granulocytes 0 %   Abs Immature Granulocytes 0.05 0.00 - 0.07 K/uL  Comprehensive metabolic panel     Status: Abnormal   Collection Time: 07/04/19  9:48 PM  Result Value Ref Range   Sodium 136 135 - 145 mmol/L   Potassium 3.4 (L) 3.5 - 5.1 mmol/L   Chloride 104 98 - 111 mmol/L   CO2 22 22 - 32 mmol/L   Glucose, Bld 103 (H) 70 - 99 mg/dL   BUN 14 6 - 20 mg/dL   Creatinine, Ser 0.55 0.44 - 1.00 mg/dL   Calcium 8.4 (L) 8.9 - 10.3 mg/dL   Total Protein 6.5 6.5 - 8.1 g/dL   Albumin 4.1 3.5 - 5.0 g/dL   AST 16 15 - 41 U/L   ALT 17 0 - 44 U/L   Alkaline Phosphatase 73 38 - 126 U/L   Total Bilirubin 0.5 0.3 - 1.2 mg/dL   GFR calc non Af Amer >60 >60 mL/min   GFR calc Af Amer >60 >60 mL/min   Anion gap 10 5 - 15  Urinalysis, Routine w reflex microscopic     Status: None   Collection Time: 07/04/19 11:11 PM  Result Value Ref Range   Color, Urine YELLOW YELLOW   APPearance CLEAR CLEAR   Specific Gravity, Urine 1.020 1.005 - 1.030   pH 6.0 5.0 - 8.0   Glucose, UA NEGATIVE NEGATIVE mg/dL   Hgb urine dipstick NEGATIVE NEGATIVE   Bilirubin Urine  NEGATIVE NEGATIVE   Ketones, ur NEGATIVE NEGATIVE mg/dL   Protein, ur NEGATIVE NEGATIVE mg/dL   Nitrite NEGATIVE NEGATIVE   Leukocytes,Ua NEGATIVE NEGATIVE  Rapid urine drug screen (hospital performed)     Status: Abnormal   Collection Time: 07/04/19 11:11 PM  Result Value Ref Range   Opiates NONE DETECTED NONE DETECTED   Cocaine NONE DETECTED NONE DETECTED   Benzodiazepines NONE DETECTED NONE DETECTED   Amphetamines NONE DETECTED NONE DETECTED   Tetrahydrocannabinol POSITIVE (A) NONE DETECTED   Barbiturates NONE DETECTED NONE DETECTED   Imaging Studies: Dg Abd Acute 2+v W 1v Chest  Result Date: 07/05/2019 CLINICAL DATA:  Umbilical pain. Endometrial ablation 1 week ago. EXAM: DG ABDOMEN ACUTE W/ 1V CHEST COMPARISON:  Abdominal CT 3 days ago 06/30/2019. FINDINGS: The cardiomediastinal contours are normal. The lungs are clear. There is no free intra-abdominal air. No dilated bowel loops to suggest obstruction. Moderate to large volume of stool throughout the colon. No radiopaque calculi. Bilateral pelvic phleboliths is seen on CT. No acute osseous abnormalities are seen. IMPRESSION: 1. Moderate to large colonic stool burden can be seen with constipation. Otherwise negative abdominal radiographs. 2. Clear lungs. Electronically Signed   By: Keith Rake M.D.   On: 07/05/2019 00:38    ED COURSE and MDM  Nursing notes and initial vitals signs, including pulse oximetry, reviewed.  Vitals:   07/04/19 2200 07/05/19 0104 07/05/19 0114 07/05/19 0130  BP:  109/68  (!) 98/55  Pulse: 78 72  (!) 58  Resp:  20  16  Temp:      TempSrc:      SpO2: 97% 99% 98% 97%   1:47 AM Patient feeling better after 1 mg of Dilaudid and 20 mg of ketamine IV.  She was advised of radiographs  showing heavy stool burden.  She is already on Movantik for opioid-induced constipation but she was advised she may benefit from adding MiraLAX daily at least temporarily.  She has an appointment with her surgeon on the 23rd  of this month.  PROCEDURES    ED DIAGNOSES     ICD-10-CM   1. Chronic abdominal pain  R10.9    G89.29   2. Constipation due to opioid therapy  K59.03    T40.2X5A        Navneet Schmuck, Jenny Reichmann, MD 07/05/19 [phone]

## 2019-07-04 NOTE — ED Notes (Signed)
ED Provider at bedside. 

## 2019-07-04 NOTE — ED Triage Notes (Addendum)
EMS transport from home. Abd pain. Given 121mcg of Fentanyl and 14.7 mg of ketamine drip pta. Pt yelling in pain on arrival. Seen at [facility] on Saturday for same. Surgery 06/12/2019 for endometriosis

## 2019-07-04 NOTE — ED Notes (Signed)
Pt states unable to urinate at this time. 

## 2019-07-04 NOTE — ED Notes (Signed)
WC to [facility] , urine collected

## 2019-07-04 NOTE — ED Notes (Signed)
Nurse and ED Provider at bedside.

## 2019-07-05 DIAGNOSIS — R1033 Periumbilical pain: Secondary | ICD-10-CM | POA: Diagnosis not present

## 2019-07-05 MED ORDER — KETAMINE HCL 10 MG/ML IJ SOLN
INTRAMUSCULAR | Status: AC
  Filled 2019-07-05: qty 1

## 2019-07-05 MED ORDER — KETAMINE HCL 10 MG/ML IJ SOLN
0.3000 mg/kg | Freq: Once | INTRAMUSCULAR | Status: AC
  Administered 2019-07-05: 20 mg via INTRAVENOUS

## 2019-07-05 NOTE — ED Notes (Addendum)
Pt states " I dont feel like you gave me anything, I usually feel it"

## 2019-07-05 NOTE — ED Notes (Signed)
Patient transported to X-ray 

## 2019-07-05 NOTE — ED Notes (Signed)
PT requesting Ketamine. MD aware. Pt alert, stating abd still hurting

## 2019-07-05 NOTE — ED Notes (Signed)
Pt states " this is not helping my pain "

## 2019-07-05 NOTE — ED Notes (Signed)
ED Provider at bedside. Pt states pain is almost gone. Alert, able to ambulate.

## 2019-07-05 NOTE — ED Notes (Signed)
PT asleep, awaken easily. States pain is much better. VSS. Continuous pulse ox. Friend at bedside. MD informed

## 2019-07-06 DIAGNOSIS — M5442 Lumbago with sciatica, left side: Secondary | ICD-10-CM | POA: Diagnosis not present

## 2019-07-07 DIAGNOSIS — R112 Nausea with vomiting, unspecified: Secondary | ICD-10-CM | POA: Diagnosis not present

## 2019-07-11 DIAGNOSIS — M5416 Radiculopathy, lumbar region: Secondary | ICD-10-CM | POA: Diagnosis not present

## 2019-07-16 ENCOUNTER — Ambulatory Visit (INDEPENDENT_AMBULATORY_CARE_PROVIDER_SITE_OTHER): Payer: Medicare Other | Admitting: Family Medicine

## 2019-07-16 ENCOUNTER — Other Ambulatory Visit: Payer: Self-pay

## 2019-07-16 ENCOUNTER — Encounter: Payer: Self-pay | Admitting: Family Medicine

## 2019-07-16 VITALS — BP 92/63 | HR 88 | Temp 98.8°F | Ht 67.0 in

## 2019-07-16 DIAGNOSIS — Z23 Encounter for immunization: Secondary | ICD-10-CM | POA: Diagnosis not present

## 2019-07-16 DIAGNOSIS — G894 Chronic pain syndrome: Secondary | ICD-10-CM | POA: Diagnosis not present

## 2019-07-16 DIAGNOSIS — T8130XA Disruption of wound, unspecified, initial encounter: Secondary | ICD-10-CM

## 2019-07-16 NOTE — Patient Instructions (Signed)
° ° ° °  If you have lab work done today you will be contacted with your lab results within the next 2 weeks.  If you have not heard from us then please contact us. The fastest way to get your results is to register for My Chart. ° ° °IF you received an x-ray today, you will receive an invoice from [facility]. Please contact [facility] at [phone] with questions or concerns regarding your invoice.  ° °IF you received labwork today, you will receive an invoice from [facility]. Please contact [facility] at [phone] with questions or concerns regarding your invoice.  ° °Our billing staff will not be able to assist you with questions regarding bills from these companies. ° °You will be contacted with the lab results as soon as they are available. The fastest way to get your results is to activate your My Chart account. Instructions are located on the last page of this paperwork. If you have not heard from us regarding the results in 2 weeks, please contact this office. °  ° ° ° °

## 2019-07-16 NOTE — Progress Notes (Signed)
9/14/202011:00 AM  Gina [name] 12-31-1975, 43 y.o., female [id number]  Chief Complaint  Gina [name] presents with  . other    says that she was misdiagnosed with diverticulitis  . Follow-up    sciatica back and legs pain, taking the oxy for the pain    HPI:   Gina [name] is a 43 y.o. female with past medical history significant for bipolar, fibromyalgia, chronic back pain with L sciatica, smoker who presents today for followup  Last OV July - telemedicine  Sent to ER for LLQ abd pain  Had surgery on aug 11th for endometriosis - has hanging internal suture, very irritating Has been in ER multiple times since then, ct abd/pelvis reassuring, AXR constipation - started medication Saw neuro at [place] for EMG - normal Pain mgt, Dr Nancy Fetter, manages percocet and clonazepam   Depression screen [facility] 2/9 05/25/2019 03/12/2019 04/14/2016  Decreased Interest 0 0 0  Down, Depressed, Hopeless 1 0 0  PHQ - 2 Score 1 0 0  Some encounter information is confidential and restricted. Go to Review Flowsheets activity to see all data.  Some recent data might be hidden    Fall Risk  07/16/2019 05/25/2019 04/12/2019 03/12/2019 04/14/2016  Falls in the past year? 0 1 0 0 Yes  Number falls in past yr: 0 1 0 0 -  Injury with Fall? 0 1 0 0 -  Risk for fall due to : - History of fall(s) - - -     Allergies  Allergen Reactions  . Lamictal [Lamotrigine] Anaphylaxis and Other (See Comments)    STEVEN JOHNSON'S SYNDROME  . Morphine And Related Other (See Comments)    Makes pt "very mean" "I get mean" Makes pt "very mean" Makes pt "very mean"  . Nicotine Swelling and Palpitations    Other reaction(s): Respiratory Distress (ALLERGY/intolerance), Swelling (ALLERGY/intolerance), Tachycardia / Palpitations  (intolerance) Patches, lozenges Patches caused palpations lozenges throat swelled  . Zofran Anaphylaxis  . Chantix [Varenicline Tartrate] Other (See Comments)     Diaphoresis, sweating.  Night terrors.   "went crazy"  . Codeine Nausea And Vomiting  . Haldol [Haloperidol] Itching, Rash and Other (See Comments)    FLUSHING  . Ibuprofen Nausea And Vomiting  . Lidoderm [Lidocaine] Other (See Comments)    DOESN'T WORK FOR Gina [name]  . Metaxalone Other (See Comments)    Pt does not remember reaction, REAL BAD REACTION  . Sulfa Antibiotics Diarrhea and Other (See Comments)    GI PAINS ALSO  . Sulfasalazine Diarrhea    GI PAINS  . Toradol [Ketorolac Tromethamine] Nausea And Vomiting  . Duloxetine Other (See Comments)    unk  . Gabapentin Nausea And Vomiting  . Ingrezza [Valbenazine Tosylate] Hives  . Methadone     seizures  . Pregabalin     unk  . Zolpidem     Mental status changes  . Amoxicillin Rash    Has Gina [name] had a PCN reaction causing immediate rash, facial/tongue/throat swelling, SOB or lightheadedness with hypotension: No Has Gina [name] had a PCN reaction causing severe rash involving mucus membranes or skin necrosis: No Has Gina [name] had a PCN reaction that required hospitalization: No Has Gina [name] had a PCN reaction occurring within the last 10 years: No If all of the above answers are "NO", then may proceed with Cephalosporin use.   . Aripiprazole Anxiety    hallucinations  . Penicillins Rash    Other reaction(s): Urticaria / Hives (ALLERGY) Has Gina [name] had a PCN reaction causing immediate rash,  facial/tongue/throat swelling, SOB or lightheadedness with hypotension: No Has Gina [name] had a PCN reaction causing severe rash involving mucus membranes or skin necrosis: No Has Gina [name] had a PCN reaction that required hospitalization: No Has Gina [name] had a PCN reaction occurring within the last 10 years: No If all of the above answers are "NO", then may proceed with Cephalosporin use.  Minus Liberty Rash    Prior to Admission medications   Medication Sig Start Date End Date Taking? Authorizing Provider  clonazePAM (KLONOPIN) 0.5 MG tablet Take 0.5 mg by mouth 2 (two) times daily.   08/30/18  Yes [provider]  cyclobenzaprine (FLEXERIL) 10 MG tablet Take 10 mg by mouth 3 (three) times daily as needed for muscle spasms.  01/19/19  Yes [provider]  escitalopram (LEXAPRO) 20 MG tablet Take 30 mg by mouth at bedtime. 12/20/18  Yes [provider]  lurasidone (LATUDA) 80 MG TABS tablet Take 80 mg by mouth at bedtime.    Yes [provider]  naloxegol oxalate (MOVANTIK) 25 MG TABS tablet Take 25 mg by mouth daily as needed (constipation).    Yes [provider]  oxyCODONE-acetaminophen (PERCOCET) 7.5-325 MG tablet Take 1 tablet by mouth 4 (four) times daily.   Yes [provider]  REXULTI 1 MG TABS Take 1 tablet by mouth daily.  01/19/19  Yes [provider]  tiaGABine (GABITRIL) 4 MG tablet Take 4 mg by mouth at bedtime. 06/25/19  Yes [provider]    Past Medical History:  Diagnosis Date  . ABDOMINAL WALL HERNIA 02/27/2010  . Abnormal Pap smear   . ALLERGIC RHINITIS 06/02/2007  . ANOREXIA, CHRONIC 08/07/2008  . ANXIETY 09/15/2010  . Arthritis   . BACK PAIN, THORACIC REGION 07/07/2007  . Bipolar disorder ([place])   . CERVICAL RADICULOPATHY 12/11/2008  . Condyloma acuminatum 04/23/2009  . CONSTIPATION 09/15/2010  . DYSPHAGIA UNSPECIFIED 09/09/2009  . 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 Eating disorder   . ENDOMETRIOSIS 12/16/2009  . FATIGUE 06/11/2010  . Fibromyalgia   . GERD 10/23/2009  . Heart palpitations   . HERPES SIMPLEX INFECTION 06/11/2010  . LENTIGO 04/23/2009  . LUNG NODULE 06/17/2010  . Narcotic abuse, continuous ([place])   . Ovarian cyst   . Palpitations 10/23/2009  . Panic disorder   . Renal disorder   . Rheumatoid arthritis(714.0)   . Stricture and stenosis of esophagus 05/13/2010  . TOBACCO ABUSE 08/07/2008  . Urinary tract infection   . UTI (urinary tract infection)     Past Surgical History:  Procedure Laterality Date  . ABDOMINAL SURGERY    . ABDOMINAL WALL MESH  REMOVAL    .  ABLATION ON ENDOMETRIOSIS    . CESAREAN SECTION     x2   . DILATION AND CURETTAGE OF UTERUS     x1   . ENDOMETRIAL ABLATION    . esophageal dilatation x 4    . GUM SURGERY    . HERNIA REPAIR     X3  . PARTIAL HYSTERECTOMY      Social History   Tobacco Use  . Smoking status: Current Every Day Smoker    Packs/day: 1.00    Types: Cigarettes  . Smokeless tobacco: Never Used  Substance Use Topics  . Alcohol use: No    Family History  Problem Relation Age of Onset  . Depression Mother   . Arthritis Mother   . Hyperlipidemia Father   . Hypertension Father     ROS Per hpi  OBJECTIVE:  Today's Vitals   07/16/19 1042  BP: 92/63  Pulse: 88  Temp: 98.8 F (37.1 C)  TempSrc: Oral  SpO2: 95%  Height: 5\' 7"  (1.702 m)   Body mass index is 22.72 kg/m.   Physical Exam Vitals signs and nursing note reviewed.  Constitutional:      Appearance: She is well-developed.  HENT:     Head: Normocephalic and atraumatic.  Eyes:     General: No scleral icterus.    Conjunctiva/sclera: Conjunctivae normal.     Pupils: Pupils are equal, round, and reactive to light.  Neck:     Musculoskeletal: Neck supple.  Pulmonary:     Effort: Pulmonary effort is normal.  Skin:    General: Skin is warm and dry.     Comments: Suprapubic incision healing well wo signs of infection with about an inch of suture exposed, which I trimmed flush to skin  Neurological:     Mental Status: She is alert and oriented to person, place, and time.       No results found for this or any previous visit (from the past 24 hour(s)).  No results found.   ASSESSMENT and PLAN  1. Chronic pain syndrome Managed neuro and pain mgt. Discussed that PT, defer to neuro per Gina [name]'s preference. Will complete disability placard as gait affected.   2. Extrusion of suture, initial encounter Incision healed, suture trimmed. No ssx of infection.   3. Need for prophylactic vaccination and inoculation against  influenza - Flu Vaccine QUAD 36+ mos IM  Return in about 6 months (around 01/13/2020).    Rutherford Guys, MD Primary [facility] at [place], [place] [address] Ph.  [phone] Fax [phone]

## 2019-07-24 ENCOUNTER — Emergency Department (HOSPITAL_COMMUNITY)
Admission: EM | Admit: 2019-07-24 | Discharge: 2019-07-24 | Disposition: A | Discharge status: Home / Self Care | Payer: Medicare Other | Attending: Emergency Medicine | Admitting: Emergency Medicine

## 2019-07-24 ENCOUNTER — Encounter (HOSPITAL_COMMUNITY): Payer: Self-pay

## 2019-07-24 ENCOUNTER — Other Ambulatory Visit: Payer: Self-pay

## 2019-07-24 DIAGNOSIS — Z79899 Other long term (current) drug therapy: Secondary | ICD-10-CM | POA: Insufficient documentation

## 2019-07-24 DIAGNOSIS — M5489 Other dorsalgia: Secondary | ICD-10-CM | POA: Diagnosis not present

## 2019-07-24 DIAGNOSIS — M5442 Lumbago with sciatica, left side: Secondary | ICD-10-CM | POA: Diagnosis not present

## 2019-07-24 DIAGNOSIS — M25552 Pain in left hip: Secondary | ICD-10-CM | POA: Diagnosis present

## 2019-07-24 DIAGNOSIS — R32 Unspecified urinary incontinence: Secondary | ICD-10-CM | POA: Diagnosis not present

## 2019-07-24 DIAGNOSIS — M5432 Sciatica, left side: Secondary | ICD-10-CM | POA: Diagnosis not present

## 2019-07-24 DIAGNOSIS — F17210 Nicotine dependence, cigarettes, uncomplicated: Secondary | ICD-10-CM | POA: Insufficient documentation

## 2019-07-24 DIAGNOSIS — I959 Hypotension, unspecified: Secondary | ICD-10-CM | POA: Diagnosis not present

## 2019-07-24 DIAGNOSIS — R000 Tachycardia, unspecified: Secondary | ICD-10-CM | POA: Diagnosis not present

## 2019-07-24 MED ORDER — LORAZEPAM 2 MG/ML IJ SOLN
1.0000 mg | Freq: Once | INTRAMUSCULAR | Status: AC
  Administered 2019-07-24: 17:00:00 1 mg via INTRAVENOUS
  Filled 2019-07-24: qty 1

## 2019-07-24 MED ORDER — METHYLPREDNISOLONE 4 MG PO TBPK: ORAL_TABLET | ORAL | 0 refills | Status: DC

## 2019-07-24 MED ORDER — HYDROMORPHONE HCL 1 MG/ML IJ SOLN
1.0000 mg | Freq: Once | INTRAMUSCULAR | Status: AC
  Administered 2019-07-24: 17:00:00 1 mg via INTRAVENOUS
  Filled 2019-07-24: qty 1

## 2019-07-24 MED ORDER — DEXAMETHASONE SODIUM PHOSPHATE 10 MG/ML IJ SOLN
10.0000 mg | Freq: Once | INTRAMUSCULAR | Status: AC
  Administered 2019-07-24: 17:00:00 10 mg via INTRAVENOUS
  Filled 2019-07-24: qty 1

## 2019-07-24 MED ORDER — PROMETHAZINE HCL 25 MG PO TABS
25.0000 mg | ORAL_TABLET | Freq: Once | ORAL | Status: AC
  Administered 2019-07-24: 17:00:00 25 mg via ORAL
  Filled 2019-07-24: qty 1

## 2019-07-24 NOTE — ED Notes (Signed)
Pt consents for verbal signature

## 2019-07-24 NOTE — ED Triage Notes (Addendum)
Per EMS: Pt from home with c/o lower back pain radiating to L leg x5days.  Pt hx of sciatica x11 years.  Pt had her "injection" 3 months ago.  Pt last took oxy at 0800.  Pt requested ketamine on the truck.

## 2019-07-24 NOTE — ED Provider Notes (Signed)
[facility] DEPT Provider Note   CSN: [id number] Arrival date & time: 07/24/19  1258     History   Chief Complaint No chief complaint on file.   HPI Paulisha Vallieres is a 43 y.o. female.     HPI   43yo female with history of bipolar, fibromyalgia, chronic back pain and piriformis syndrome with left sided sciatica, smoking, surgery 8/11 for endometriosis, seen by Neurology at [facility] and Dr. Nancy Fetter for pain management who presents with concern for worsening pain for 5 days.  Patient reports that her symptoms are worse after hosting her daughter's baby shower last weekend.  Reports severe pain over the last 5 days with pain over her left buttock and hip, radiating down the left leg.  Reports that whenever the pain gets severe she has difficulty getting to the bathroom on time and does have episodes of urinary incontinence.  Otherwise denies dysuria, urinary hesitancy, bowel incontinence, new weakness or numbness.  Reports that the pain is severe and not relieved by her home Percocet prescription.  She reports she has many allergies, and that she is had relief in the past with IV Dilaudid, Phenergan, Ativan, and ketamine.  She has not had any falls or trauma.  Denies fevers or IV drug use.  Denies abdominal pain.  Past Medical History:  Diagnosis Date  . ABDOMINAL WALL HERNIA 02/27/2010  . Abnormal Pap smear   . ALLERGIC RHINITIS 06/02/2007  . ANOREXIA, CHRONIC 08/07/2008  . ANXIETY 09/15/2010  . Arthritis   . BACK PAIN, THORACIC REGION 07/07/2007  . Bipolar disorder ([place])   . CERVICAL RADICULOPATHY 12/11/2008  . Condyloma acuminatum 04/23/2009  . CONSTIPATION 09/15/2010  . DYSPHAGIA UNSPECIFIED 09/09/2009  . 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 Eating disorder   . ENDOMETRIOSIS 12/16/2009  . FATIGUE 06/11/2010  . Fibromyalgia   . GERD 10/23/2009  . Heart palpitations   . HERPES SIMPLEX INFECTION 06/11/2010  . LENTIGO 04/23/2009  . LUNG NODULE 06/17/2010  . Narcotic  abuse, continuous ([place])   . Ovarian cyst   . Palpitations 10/23/2009  . Panic disorder   . Renal disorder   . Rheumatoid arthritis(714.0)   . Stricture and stenosis of esophagus 05/13/2010  . TOBACCO ABUSE 08/07/2008  . Urinary tract infection   . UTI (urinary tract infection)     Patient Active Problem List   Diagnosis Date Noted  . Piriformis syndrome of both sides 05/31/2019  . History of bipolar disorder 04/18/2019  . Tardive dyskinesia 04/18/2019  . Urticaria due to drug allergy 04/18/2019  . Lumbar spondylosis 02/05/2019  . Bipolar affective disorder, current episode mixed ([place]) 01/02/2019  . Arthritis 01/02/2019  . Cigarette nicotine dependence without complication [id number]  . Bilateral temporomandibular joint pain 10/02/2018  . Referred otalgia, bilateral 10/02/2018  . Paresthesias 03/09/2018  . Common migraine 04/22/2016  . Bipolar 1 disorder, mixed, moderate ([facility]) 07/15/2015    Class: Chronic  . 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 Sciatica 01/09/2015  . Reactive hypoglycemia 12/17/2013  . Musculoskeletal malfunction arising from mental factors 04/04/2013  . Incisional hernia 11/15/2012  . Other postprocedural status(V45.89) 11/15/2012  . S/P hernia surgery 11/15/2012  . Abdominal pain 11/11/2012  . Chronic pain syndrome 11/11/2012  . Disorder of sacrum 11/11/2012  . Heartburn 10/04/2011  . DYSPHAGIA 10/04/2011  . Depression with anxiety 09/15/2010  . CONSTIPATION 09/15/2010  . LUNG NODULE 06/17/2010  . Herpes simplex virus (HSV) infection 06/11/2010  . FATIGUE 06/11/2010  . STRICTURE  AND STENOSIS OF ESOPHAGUS 05/13/2010  . ABDOMINAL WALL HERNIA 02/27/2010  . ENDOMETRIOSIS 12/16/2009  . GERD 10/23/2009  . PALPITATIONS 10/23/2009  . 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 CONDYLOMA ACUMINATUM 04/23/2009  . LENTIGO 04/23/2009  . DENTAL PAIN 03/19/2009  . CERVICAL RADICULOPATHY 12/11/2008  .  Cervical radiculopathy 12/11/2008  . 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 TOBACCO ABUSE 08/07/2008  . BACK PAIN, THORACIC REGION 07/07/2007  . ALLERGIC RHINITIS 06/02/2007    Past Surgical History:  Procedure Laterality Date  . ABDOMINAL SURGERY    . ABDOMINAL WALL MESH  REMOVAL    . ABLATION ON ENDOMETRIOSIS    . CESAREAN SECTION     x2   . DILATION AND CURETTAGE OF UTERUS     x1   . ENDOMETRIAL ABLATION    . esophageal dilatation x 4    . GUM SURGERY    . HERNIA REPAIR     X3  . PARTIAL HYSTERECTOMY       OB History    Gravida  3   Para  2   Term  2   Preterm  0   AB  1   Living  2     SAB  1   TAB  0   Ectopic  0   Multiple  0   Live Births  1            Home Medications    Prior to Admission medications   Medication Sig Start Date End Date Taking? Authorizing Provider  clonazePAM (KLONOPIN) 0.5 MG tablet Take 0.5 mg by mouth 2 (two) times daily.  08/30/18  Yes [provider]  cyclobenzaprine (FLEXERIL) 10 MG tablet Take 10 mg by mouth 3 (three) times daily as needed for muscle spasms.  01/19/19  Yes [provider]  escitalopram (LEXAPRO) 20 MG tablet Take 30 mg by mouth at bedtime. 12/20/18  Yes [provider]  lurasidone (LATUDA) 80 MG TABS tablet Take 80 mg by mouth at bedtime.    Yes [provider]  naloxegol oxalate (MOVANTIK) 25 MG TABS tablet Take 25 mg by mouth daily as needed (constipation).    Yes [provider]  oxyCODONE-acetaminophen (PERCOCET) 7.5-325 MG tablet Take 1 tablet by mouth 4 (four) times daily.   Yes [provider]  REXULTI 1 MG TABS Take 1 tablet by mouth daily.  01/19/19  Yes [provider]  tiaGABine (GABITRIL) 4 MG tablet Take 4 mg by mouth at bedtime. 06/25/19  Yes [provider]  methylPREDNISolone (MEDROL DOSEPAK) 4 MG TBPK tablet See instructions on packet 07/24/19   Gareth Morgan, MD    Family History Family History   Problem Relation Age of Onset  . Depression Mother   . Arthritis Mother   . Hyperlipidemia Father   . Hypertension Father     Social History Social History   Tobacco Use  . Smoking status: Current Every Day Smoker    Packs/day: 1.00    Types: Cigarettes  . Smokeless tobacco: Never Used  Substance Use Topics  . Alcohol use: No  . Drug use: No     Allergies   Lamictal [lamotrigine], Morphine and related, Nicotine, Zofran, Chantix [varenicline tartrate], Codeine, Haldol [haloperidol], Ibuprofen, Lidoderm [lidocaine], Metaxalone, Sulfa antibiotics, Sulfasalazine, Toradol [ketorolac tromethamine], Duloxetine, Gabapentin, Ingrezza [valbenazine tosylate], Methadone, Pregabalin, Zolpidem, Amoxicillin, Aripiprazole, Penicillins, and Valbenazine   Review of Systems Review of Systems  Constitutional: Negative for fever.  HENT:  Negative for sore throat.   Eyes: Negative for visual disturbance.  Respiratory: Negative for cough and shortness of breath.   Gastrointestinal: Negative for abdominal pain.  Genitourinary: Negative for difficulty urinating and dysuria.  Musculoskeletal: Positive for arthralgias, back pain and myalgias. Negative for neck pain.  Skin: Negative for rash.  Neurological: Negative for syncope, weakness, numbness and headaches.     Physical Exam Updated Vital Signs BP 102/64   Pulse 95   Temp 99.5 F (37.5 C) (Oral)   Resp 16   LMP 01/01/2004   SpO2 97%   Physical Exam Vitals signs and nursing note reviewed.  Constitutional:      General: She is not in acute distress.    Appearance: She is well-developed. She is not diaphoretic.  HENT:     Head: Normocephalic and atraumatic.  Eyes:     Conjunctiva/sclera: Conjunctivae normal.  Neck:     Musculoskeletal: Normal range of motion.  Cardiovascular:     Rate and Rhythm: Normal rate and regular rhythm.  Pulmonary:     Effort: Pulmonary effort is normal. No respiratory distress.  Abdominal:     General:  There is no distension.     Palpations: Abdomen is soft.     Tenderness: There is no abdominal tenderness. There is no guarding.  Musculoskeletal:        General: No tenderness.  Skin:    General: Skin is warm and dry.     Findings: No erythema or rash.  Neurological:     Mental Status: She is alert and oriented to person, place, and time.     GCS: GCS eye subscore is 4. GCS verbal subscore is 5. GCS motor subscore is 6.     Sensory: Sensation is intact.     Motor: Motor function is intact. No weakness (reports pain with movement but is 5/5).      ED Treatments / Results  Labs (all labs ordered are listed, but only abnormal results are displayed) Labs Reviewed - No data to display  EKG None  Radiology No results found.  Procedures Procedures (including critical care time)  Medications Ordered in ED Medications  HYDROmorphone (DILAUDID) injection 1 mg (1 mg Intravenous Given 07/24/19 1640)  LORazepam (ATIVAN) injection 1 mg (1 mg Intravenous Given 07/24/19 1641)  dexamethasone (DECADRON) injection 10 mg (10 mg Intravenous Given 07/24/19 1639)  promethazine (PHENERGAN) tablet 25 mg (25 mg Oral Given 07/24/19 1705)     Initial Impression / Assessment and Plan / ED Course  I have reviewed the triage vital signs and the nursing notes.  Pertinent labs & imaging results that were available during my care of the patient were reviewed by me and considered in my medical decision making (see chart for details).         43yo female with history of bipolar, fibromyalgia, chronic back pain and piriformis syndrome with left sided sciatica, smoking, surgery 8/11 for endometriosis, seen by Neurology at [facility] and Dr. Nancy Fetter for pain management who presents with concern for worsening pain for 5 days.  Patient has a normal neurologic exam and denies any urinary retention, stool incontinence, saddle anesthesia, fever, IV drug use, trauma, chronic steroid use or immunocompromise and have low  suspicion suspicion for cauda equina, fracture, epidural abscess, or vertebral osteomyelitis.  No sign of acute intraabdominal process, no dysuria.  Suspect acute on chronic pain related to sciatica/piriformis syndrome. Feel she will benefit from steroids and outpatient follow up.  Patient tearful, reporting only IV  medications work for her pain. Told her we can do one dose but I do not recommend continued IV pain medications and feel that other ways of managing her pain are more appropriate.  Given rx for steroids, recommend outpatient follow up.   Final Clinical Impressions(s) / ED Diagnoses   Final diagnoses:  Sciatica of left side    ED Discharge Orders         Ordered    methylPREDNISolone (MEDROL DOSEPAK) 4 MG TBPK tablet     07/24/19 1717           Gareth Morgan, MD 07/24/19 1757

## 2019-07-30 DIAGNOSIS — M5442 Lumbago with sciatica, left side: Secondary | ICD-10-CM | POA: Diagnosis not present

## 2019-07-30 DIAGNOSIS — M62838 Other muscle spasm: Secondary | ICD-10-CM | POA: Diagnosis not present

## 2019-07-30 DIAGNOSIS — Z79899 Other long term (current) drug therapy: Secondary | ICD-10-CM | POA: Diagnosis not present

## 2019-07-30 DIAGNOSIS — G8929 Other chronic pain: Secondary | ICD-10-CM | POA: Diagnosis not present

## 2019-07-30 DIAGNOSIS — G5702 Lesion of sciatic nerve, left lower limb: Secondary | ICD-10-CM | POA: Diagnosis not present

## 2019-07-30 DIAGNOSIS — E7800 Pure hypercholesterolemia, unspecified: Secondary | ICD-10-CM | POA: Diagnosis not present

## 2019-08-02 DIAGNOSIS — M9905 Segmental and somatic dysfunction of pelvic region: Secondary | ICD-10-CM | POA: Diagnosis not present

## 2019-08-02 DIAGNOSIS — M9902 Segmental and somatic dysfunction of thoracic region: Secondary | ICD-10-CM | POA: Diagnosis not present

## 2019-08-02 DIAGNOSIS — M9903 Segmental and somatic dysfunction of lumbar region: Secondary | ICD-10-CM | POA: Diagnosis not present

## 2019-08-02 DIAGNOSIS — M5442 Lumbago with sciatica, left side: Secondary | ICD-10-CM | POA: Diagnosis not present

## 2019-08-05 DIAGNOSIS — M5442 Lumbago with sciatica, left side: Secondary | ICD-10-CM | POA: Diagnosis not present

## 2019-08-06 DIAGNOSIS — M9902 Segmental and somatic dysfunction of thoracic region: Secondary | ICD-10-CM | POA: Diagnosis not present

## 2019-08-06 DIAGNOSIS — M5442 Lumbago with sciatica, left side: Secondary | ICD-10-CM | POA: Diagnosis not present

## 2019-08-06 DIAGNOSIS — M9905 Segmental and somatic dysfunction of pelvic region: Secondary | ICD-10-CM | POA: Diagnosis not present

## 2019-08-06 DIAGNOSIS — M9903 Segmental and somatic dysfunction of lumbar region: Secondary | ICD-10-CM | POA: Diagnosis not present

## 2019-08-11 ENCOUNTER — Encounter (HOSPITAL_COMMUNITY): Payer: Self-pay

## 2019-08-11 ENCOUNTER — Emergency Department (HOSPITAL_COMMUNITY)
Admission: EM | Admit: 2019-08-11 | Discharge: 2019-08-11 | Disposition: A | Discharge status: Home / Self Care | Payer: Medicare Other | Attending: Emergency Medicine | Admitting: Emergency Medicine

## 2019-08-11 ENCOUNTER — Other Ambulatory Visit: Payer: Self-pay

## 2019-08-11 DIAGNOSIS — F17210 Nicotine dependence, cigarettes, uncomplicated: Secondary | ICD-10-CM | POA: Diagnosis not present

## 2019-08-11 DIAGNOSIS — G8929 Other chronic pain: Secondary | ICD-10-CM | POA: Insufficient documentation

## 2019-08-11 DIAGNOSIS — M546 Pain in thoracic spine: Secondary | ICD-10-CM

## 2019-08-11 DIAGNOSIS — Z79899 Other long term (current) drug therapy: Secondary | ICD-10-CM | POA: Diagnosis not present

## 2019-08-11 DIAGNOSIS — M5489 Other dorsalgia: Secondary | ICD-10-CM | POA: Diagnosis not present

## 2019-08-11 DIAGNOSIS — M549 Dorsalgia, unspecified: Secondary | ICD-10-CM | POA: Insufficient documentation

## 2019-08-11 LAB — COMPREHENSIVE METABOLIC PANEL
ALT: 15 U/L (ref 0–44)
AST: 17 U/L (ref 15–41)
Albumin: 4.1 g/dL (ref 3.5–5.0)
Alkaline Phosphatase: 67 U/L (ref 38–126)
Anion gap: 7 (ref 5–15)
BUN: 12 mg/dL (ref 6–20)
CO2: 25 mmol/L (ref 22–32)
Calcium: 8.5 mg/dL — ABNORMAL LOW (ref 8.9–10.3)
Chloride: 106 mmol/L (ref 98–111)
Creatinine, Ser: 0.57 mg/dL (ref 0.44–1.00)
GFR calc Af Amer: >60 mL/min (ref >60)
GFR calc non Af Amer: >60 mL/min (ref >60)
Glucose, Bld: 91 mg/dL (ref 70–99)
Potassium: 3.7 mmol/L (ref 3.5–5.1)
Sodium: 138 mmol/L (ref 135–145)
Total Bilirubin: 0.7 mg/dL (ref 0.3–1.2)
Total Protein: 6.3 g/dL — ABNORMAL LOW (ref 6.5–8.1)

## 2019-08-11 LAB — RAPID URINE DRUG SCREEN, HOSP PERFORMED
Amphetamines: NOT DETECTED
Barbiturates: NOT DETECTED
Benzodiazepines: NOT DETECTED
Cocaine: NOT DETECTED
Opiates: NOT DETECTED
Tetrahydrocannabinol: POSITIVE — AB

## 2019-08-11 MED ORDER — PROMETHAZINE HCL 25 MG/ML IJ SOLN
6.2500 mg | Freq: Once | INTRAMUSCULAR | Status: AC
  Administered 2019-08-11: 6.25 mg via INTRAVENOUS
  Filled 2019-08-11: qty 1

## 2019-08-11 MED ORDER — SODIUM CHLORIDE 0.9 % IV BOLUS
500.0000 mL | Freq: Once | INTRAVENOUS | Status: AC
  Administered 2019-08-11: 19:00:00 500 mL via INTRAVENOUS

## 2019-08-11 MED ORDER — KETAMINE HCL 50 MG/5ML IJ SOSY
30.0000 mg | PREFILLED_SYRINGE | Freq: Once | INTRAMUSCULAR | Status: AC
  Administered 2019-08-11: 20:00:00 30 mg via INTRAVENOUS
  Filled 2019-08-11: qty 5

## 2019-08-11 MED ORDER — KETAMINE HCL 10 MG/ML IJ SOLN: 0.3000 mg/kg | Freq: Once | INTRAMUSCULAR | Status: DC

## 2019-08-11 MED ORDER — DEXAMETHASONE SODIUM PHOSPHATE 10 MG/ML IJ SOLN
10.0000 mg | Freq: Once | INTRAMUSCULAR | Status: AC
  Administered 2019-08-11: 19:00:00 10 mg via INTRAVENOUS
  Filled 2019-08-11: qty 1

## 2019-08-11 NOTE — ED Triage Notes (Signed)
EMS reports increasing chronic back pain, Pt states was attempting to exercise and pain increased. Hx of sciatica.   BP 100/70 HR 80 RR 18 Sp02 99 RA

## 2019-08-11 NOTE — Discharge Instructions (Signed)
You will need to follow up with your pain management doctors for further pain medications and treatment.

## 2019-08-11 NOTE — ED Notes (Signed)
Patient demanding IV removal and d/c paperwork immediately following administration of ketamine.

## 2019-08-11 NOTE — ED Provider Notes (Signed)
[facility] DEPT Provider Note   CSN: [id number] Arrival date & time: 08/11/19  1657     History   Chief Complaint Chief Complaint  Patient presents with  . Back Pain    HPI Gina [name] is a 43 y.o. female with past medical hx s/f depression, anxiety, piriformis syndrome, chronic back pain, sciatica, bipolar 1, arthritis, tobacco dependence who presents to the ED with back pain.  Patient took her daughters to the breakfast and then [employer]. She then had increasing pain.  When she arrived home, she started to try doing some exercises that she has been previously encouraged to do and she reports that these made the pain 10 times worse.  Takes oxycodone (been taking for 13 years) but is for her regular everyday pain. Took two oxycodone today, and tried flexeril. Patient reports that she drove today, which she thinks may have aggravated her symptoms.  Patient also receives injections from neurologist (she sees 3 different neurologist).  The injections used to last for 8 months and now they only last for 2 to 3 months.  Patient reports that she has not received an injection recently so that she could hold off as long as possible as her daughter is pregnant and she would like to be there for the birth.  Patient believes injection has "worn off".  Unclear when her last injection was. Has been to chiropractor who she believes really helps with her pain, but he was closed today, so couldn't go.  Patient reports cocktail of medicine that we typically give her. She takes ativan to relax her muscles. For nausea, takes phenergan. IV dilaudid or ketamine both work for pain. And then gets IV decadron.  Patient just wants to get past the hump until she can see her chiropractor and get an injection. Offered injection, but patient reports that she needs to have special x ray for injection.   Patient reports bladder incontinence, numbness in her left leg.   Allergies &  Medications Lamictal [lamotrigine], Morphine and related, Nicotine, Zofran, Chantix [varenicline tartrate], Codeine, Haldol [haloperidol], Ibuprofen, Lidoderm [lidocaine], Metaxalone, Sulfa antibiotics, Sulfasalazine, Toradol [ketorolac tromethamine], Duloxetine, Gabapentin, Ingrezza [valbenazine tosylate], Methadone, Pregabalin, Zolpidem, Amoxicillin, Aripiprazole, Penicillins, and Valbenazine   Prior to Admission medications   Medication Sig Start Date End Date Taking? Authorizing Provider  clonazePAM (KLONOPIN) 0.5 MG tablet Take 0.5 mg by mouth 2 (two) times daily.  08/30/18   [provider]  cyclobenzaprine (FLEXERIL) 10 MG tablet Take 10 mg by mouth 3 (three) times daily as needed for muscle spasms.  01/19/19   [provider]  escitalopram (LEXAPRO) 20 MG tablet Take 30 mg by mouth at bedtime. 12/20/18   [provider]  lurasidone (LATUDA) 80 MG TABS tablet Take 80 mg by mouth at bedtime.     [provider]  methylPREDNISolone (MEDROL DOSEPAK) 4 MG TBPK tablet See instructions on packet 07/24/19   Gareth Morgan, MD  naloxegol oxalate (MOVANTIK) 25 MG TABS tablet Take 25 mg by mouth daily as needed (constipation).     [provider]  oxyCODONE-acetaminophen (PERCOCET) 7.5-325 MG tablet Take 1 tablet by mouth 4 (four) times daily.    [provider]  REXULTI 1 MG TABS Take 1 tablet by mouth daily.  01/19/19   [provider]  tiaGABine (GABITRIL) 4 MG tablet Take 4 mg by mouth at bedtime. 06/25/19   [provider]    Past Medical/Surgical History Past Medical History:  Diagnosis Date  .  ABDOMINAL WALL HERNIA 02/27/2010  . Abnormal Pap smear   . ALLERGIC RHINITIS 06/02/2007  . ANOREXIA, CHRONIC 08/07/2008  . ANXIETY 09/15/2010  . Arthritis   . BACK PAIN, THORACIC REGION 07/07/2007  . Bipolar disorder ([place])   . CERVICAL RADICULOPATHY 12/11/2008  . Condyloma acuminatum 04/23/2009  . CONSTIPATION 09/15/2010  . DYSPHAGIA  UNSPECIFIED 09/09/2009  . 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 Eating disorder   . ENDOMETRIOSIS 12/16/2009  . FATIGUE 06/11/2010  . Fibromyalgia   . GERD 10/23/2009  . Heart palpitations   . HERPES SIMPLEX INFECTION 06/11/2010  . LENTIGO 04/23/2009  . LUNG NODULE 06/17/2010  . Narcotic abuse, continuous ([place])   . Ovarian cyst   . Palpitations 10/23/2009  . Panic disorder   . Renal disorder   . Rheumatoid arthritis(714.0)   . Stricture and stenosis of esophagus 05/13/2010  . TOBACCO ABUSE 08/07/2008  . Urinary tract infection   . UTI (urinary tract infection)    Patient Active Problem List   Diagnosis Date Noted  . Piriformis syndrome of both sides 05/31/2019  . History of bipolar disorder 04/18/2019  . Tardive dyskinesia 04/18/2019  . Urticaria due to drug allergy 04/18/2019  . Lumbar spondylosis 02/05/2019  . Bipolar affective disorder, current episode mixed ([place]) 01/02/2019  . Arthritis 01/02/2019  . Cigarette nicotine dependence without complication 123XX123  . Bilateral temporomandibular joint pain 10/02/2018  . Referred otalgia, bilateral 10/02/2018  . Paresthesias 03/09/2018  . Common migraine 04/22/2016  . Bipolar 1 disorder, mixed, moderate (HCC) 07/15/2015    Class: Chronic  . 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 Sciatica 01/09/2015  . Reactive hypoglycemia 12/17/2013  . Musculoskeletal malfunction arising from mental factors 04/04/2013  . Incisional hernia 11/15/2012  . Other postprocedural status(V45.89) 11/15/2012  . S/P hernia surgery 11/15/2012  . Abdominal pain 11/11/2012  . Chronic pain syndrome 11/11/2012  . Disorder of sacrum 11/11/2012  . Heartburn 10/04/2011  . DYSPHAGIA 10/04/2011  . Depression with anxiety 09/15/2010  . CONSTIPATION 09/15/2010  . LUNG NODULE 06/17/2010  . Herpes simplex virus (HSV) infection 06/11/2010  . FATIGUE 06/11/2010  . STRICTURE AND STENOSIS OF ESOPHAGUS 05/13/2010  . ABDOMINAL WALL HERNIA 02/27/2010  .  ENDOMETRIOSIS 12/16/2009  . GERD 10/23/2009  . PALPITATIONS 10/23/2009  . 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 CONDYLOMA ACUMINATUM 04/23/2009  . LENTIGO 04/23/2009  . DENTAL PAIN 03/19/2009  . CERVICAL RADICULOPATHY 12/11/2008  . Cervical radiculopathy 12/11/2008  . 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 TOBACCO ABUSE 08/07/2008  . BACK PAIN, THORACIC REGION 07/07/2007  . ALLERGIC RHINITIS 06/02/2007   Past Surgical History:  Procedure Laterality Date  . ABDOMINAL SURGERY    . ABDOMINAL WALL MESH  REMOVAL    . ABLATION ON ENDOMETRIOSIS    . CESAREAN SECTION     x2   . DILATION AND CURETTAGE OF UTERUS     x1   . ENDOMETRIAL ABLATION    . esophageal dilatation x 4    . GUM SURGERY    . HERNIA REPAIR     X3  . PARTIAL HYSTERECTOMY       OB History:   OB History    Gravida  3   Para  2   Term  2   Preterm  0   AB  1   Living  2     SAB  1   TAB  0   Ectopic  0   Multiple  0   Live Births  1           Family History Family History  Problem Relation Age of Onset  . Depression Mother   . Arthritis Mother   . Hyperlipidemia Father   . Hypertension Father     Social History Social History   Tobacco Use  . Smoking status: Current Every Day Smoker    Packs/day: 1.00    Types: Cigarettes  . Smokeless tobacco: Never Used  Substance Use Topics  . Alcohol use: No  . Drug use: No    Review of Systems Review of Systems  Physical Exam Updated Vital Signs BP 103/61 (BP Location: Right Arm)   Pulse (!) 108   Temp 98.5 F (36.9 C) (Oral)   Resp 20   LMP 01/01/2004   SpO2 96%   Physical Exam  ED Treatments / Results  Labs (all labs ordered are listed, but only abnormal results are displayed) Labs Reviewed - No data to display  EKG None  Radiology No results found.  Procedures Procedures (including critical care time)  Medications Ordered in ED Medications - No data to  display  Initial Impression / Assessment and Plan / ED Course  I have reviewed the triage vital signs and the nursing notes. Pertinent labs & imaging results that were available during my care of the patient were reviewed by me and considered in my medical decision making (see chart for details). Patient presenting with back pain with history of chronic pain and multiple ED visits for IV opiates.  Patient demonstrates classical drug-seeking behavior.  Her pain is out of proportion, her physical exam is inconsistent, and she is demanding IV pain medication as this is the only thing that helps her.  I offered patient to do injection that she received from neurology to help with the pain today, she reports that she needs a very special procedure to get the injection involving x-ray machine and long needle, so we would be unable to do it here. Discussed patient with Dr. Laverta Baltimore who would like to establish care plan for patient.  We will not be giving the patient IV Dilaudid or IV Ativan.  We will give her low-dose IV ketamine, Phenergan, fluids.       Final Clinical Impressions(s) / ED Diagnoses   Final diagnoses:  None    Disposition:   ED Discharge Orders    None       Wilber Oliphant, M.D. FM PGY-2       Wilber Oliphant, MD 08/12/19 0001    Margette Fast, MD 08/12/19 1152    Margette Fast, MD 08/25/19 1004

## 2019-08-13 ENCOUNTER — Other Ambulatory Visit: Payer: Self-pay | Admitting: *Deleted

## 2019-08-13 DIAGNOSIS — G894 Chronic pain syndrome: Secondary | ICD-10-CM

## 2019-08-13 DIAGNOSIS — M5442 Lumbago with sciatica, left side: Secondary | ICD-10-CM | POA: Diagnosis not present

## 2019-08-13 DIAGNOSIS — M9902 Segmental and somatic dysfunction of thoracic region: Secondary | ICD-10-CM | POA: Diagnosis not present

## 2019-08-13 DIAGNOSIS — M9905 Segmental and somatic dysfunction of pelvic region: Secondary | ICD-10-CM | POA: Diagnosis not present

## 2019-08-13 DIAGNOSIS — M9903 Segmental and somatic dysfunction of lumbar region: Secondary | ICD-10-CM | POA: Diagnosis not present

## 2019-08-13 NOTE — Patient Outreach (Signed)
Member assessed for potential [facility] Care Management needs as a benefit of [facility] Medicare.  Per Patient Gina [name], Gina [name] has had multiple ED visits. Most recent ED visit was on 08/11/19.  Will make referral to [facility] Management due to frequent ED admits.   Marthenia Rolling, MSN-Ed, RN,BSN [facility] Acute Care Coordinator [phone] [facility]) [phone]  (Toll free office)

## 2019-08-14 ENCOUNTER — Other Ambulatory Visit: Payer: Self-pay

## 2019-08-14 NOTE — Patient Outreach (Signed)
New referral:  Reviewed MEDICAL RECORD NUMBER 25 ED visits in the last year.  Placed call to patient who answered and identified herself. Reports she is asleep to call back later.  PLAN: call back after lunch.  Tomasa Rand, RN, BSN, CEN [facility] [employer] [phone]

## 2019-08-14 NOTE — Patient Outreach (Signed)
New referral: High ED utilizer   2nd call to patient today with no answer.  PLAN: will mail an unsuccessful outreach letter and contact patient again on 08/17/2019  Tomasa Rand, RN, BSN, CEN [facility] [employer] [phone]

## 2019-08-15 ENCOUNTER — Other Ambulatory Visit: Payer: Self-pay

## 2019-08-15 NOTE — Patient Outreach (Signed)
Screening:  Placed call to patient that was successful.  Reviewed reason for call. Reviewed frequency of ED visit and concern about additional needs.  Patient reports she was finally diagnosed with misalignment of neck and is seeing a chiropractor weekly. Reports decrease in pain.  Reports she feels like this is finally working for her.  Reports current concern for transportation as driving makes her pain worse.   Patient reports she has all her medications and is taking her medications as prescribed.  Reports being unhappy after recent ED visit.   Reviewed needs and patient denies any nursing needs but has agreed to services from [employer], [employer] for transportation resources. Patient agreed to letter to be mail and she will call me for nursing needs in the future.  PLAN: referral to social worker Close case to nursing.  Tomasa Rand, RN, BSN, CEN [facility] [employer] [phone]

## 2019-08-17 ENCOUNTER — Other Ambulatory Visit: Payer: Self-pay

## 2019-08-17 ENCOUNTER — Ambulatory Visit: Payer: Medicare Other

## 2019-08-17 NOTE — Patient Outreach (Signed)
Gina [name] [facility]) Care Management  08/17/2019  Gina [name] 12/01/1975 [id number]  Successful outreach to patient regarding social work referral for transportation resources.  Informed patient that she may have transportation benefit through [employer].  Encouraged her to contact customer services to verify.  Talked with patient about SCAT services and offered to complete/submit application on her behalf.  Patient declined stating "I'm not getting on a SCAT bus." Closing case but encouraged her to call if additional needs arise or if she changes her mind about applying for SCAT.  Ronn Melena, BSW Social Worker [phone]

## 2019-08-20 DIAGNOSIS — M9902 Segmental and somatic dysfunction of thoracic region: Secondary | ICD-10-CM | POA: Diagnosis not present

## 2019-08-20 DIAGNOSIS — M5442 Lumbago with sciatica, left side: Secondary | ICD-10-CM | POA: Diagnosis not present

## 2019-08-20 DIAGNOSIS — M9903 Segmental and somatic dysfunction of lumbar region: Secondary | ICD-10-CM | POA: Diagnosis not present

## 2019-08-20 DIAGNOSIS — M9905 Segmental and somatic dysfunction of pelvic region: Secondary | ICD-10-CM | POA: Diagnosis not present

## 2019-08-27 DIAGNOSIS — M9903 Segmental and somatic dysfunction of lumbar region: Secondary | ICD-10-CM | POA: Diagnosis not present

## 2019-08-27 DIAGNOSIS — M9905 Segmental and somatic dysfunction of pelvic region: Secondary | ICD-10-CM | POA: Diagnosis not present

## 2019-08-27 DIAGNOSIS — G8929 Other chronic pain: Secondary | ICD-10-CM | POA: Diagnosis not present

## 2019-08-27 DIAGNOSIS — M9902 Segmental and somatic dysfunction of thoracic region: Secondary | ICD-10-CM | POA: Diagnosis not present

## 2019-08-27 DIAGNOSIS — M5442 Lumbago with sciatica, left side: Secondary | ICD-10-CM | POA: Diagnosis not present

## 2019-09-04 DIAGNOSIS — M9902 Segmental and somatic dysfunction of thoracic region: Secondary | ICD-10-CM | POA: Diagnosis not present

## 2019-09-04 DIAGNOSIS — M9905 Segmental and somatic dysfunction of pelvic region: Secondary | ICD-10-CM | POA: Diagnosis not present

## 2019-09-04 DIAGNOSIS — M5442 Lumbago with sciatica, left side: Secondary | ICD-10-CM | POA: Diagnosis not present

## 2019-09-04 DIAGNOSIS — M9903 Segmental and somatic dysfunction of lumbar region: Secondary | ICD-10-CM | POA: Diagnosis not present

## 2019-09-05 DIAGNOSIS — M5442 Lumbago with sciatica, left side: Secondary | ICD-10-CM | POA: Diagnosis not present

## 2019-09-06 ENCOUNTER — Telehealth: Payer: Self-pay | Admitting: Family Medicine

## 2019-09-06 NOTE — Telephone Encounter (Signed)
Put parking placecard paperwork in [place] box at [place] came across fax 11.06.2020

## 2019-09-10 DIAGNOSIS — M5432 Sciatica, left side: Secondary | ICD-10-CM | POA: Diagnosis not present

## 2019-09-10 DIAGNOSIS — M5387 Other specified dorsopathies, lumbosacral region: Secondary | ICD-10-CM | POA: Diagnosis not present

## 2019-09-10 DIAGNOSIS — M62838 Other muscle spasm: Secondary | ICD-10-CM | POA: Diagnosis not present

## 2019-09-12 ENCOUNTER — Telehealth: Payer: Self-pay | Admitting: Family Medicine

## 2019-09-12 NOTE — Telephone Encounter (Signed)
[facility] with [facility]  Under [facility] would like a verbal ok or a signed documentfor PT  Area treated low back and left hip  Provider Lodema Pilot  cb [phone] Fax  [phone]

## 2019-09-13 DIAGNOSIS — M62838 Other muscle spasm: Secondary | ICD-10-CM | POA: Diagnosis not present

## 2019-09-13 DIAGNOSIS — M5432 Sciatica, left side: Secondary | ICD-10-CM | POA: Diagnosis not present

## 2019-09-13 DIAGNOSIS — M5387 Other specified dorsopathies, lumbosacral region: Secondary | ICD-10-CM | POA: Diagnosis not present

## 2019-09-13 NOTE — Telephone Encounter (Signed)
Pt called requesting an update on her pprwrk and a cb for pick up. She wants the form faxed back to her husband and not the DMV. States the fax# should be on the pprwrk. She also wants a copy for herself. Pt expecting call today or tomorrow afternoon. Please advise

## 2019-09-17 DIAGNOSIS — M5387 Other specified dorsopathies, lumbosacral region: Secondary | ICD-10-CM | POA: Diagnosis not present

## 2019-09-17 DIAGNOSIS — M62838 Other muscle spasm: Secondary | ICD-10-CM | POA: Diagnosis not present

## 2019-09-17 DIAGNOSIS — M5432 Sciatica, left side: Secondary | ICD-10-CM | POA: Diagnosis not present

## 2019-09-18 DIAGNOSIS — M62838 Other muscle spasm: Secondary | ICD-10-CM | POA: Diagnosis not present

## 2019-09-18 DIAGNOSIS — G5703 Lesion of sciatic nerve, bilateral lower limbs: Secondary | ICD-10-CM | POA: Diagnosis not present

## 2019-09-19 ENCOUNTER — Encounter (HOSPITAL_COMMUNITY): Payer: Self-pay

## 2019-09-19 ENCOUNTER — Other Ambulatory Visit: Payer: Self-pay

## 2019-09-19 ENCOUNTER — Emergency Department (HOSPITAL_COMMUNITY)
Admission: EM | Admit: 2019-09-19 | Discharge: 2019-09-19 | Disposition: A | Discharge status: Home / Self Care | Payer: Medicare Other | Attending: Emergency Medicine | Admitting: Emergency Medicine

## 2019-09-19 DIAGNOSIS — Z79899 Other long term (current) drug therapy: Secondary | ICD-10-CM | POA: Diagnosis not present

## 2019-09-19 DIAGNOSIS — F17210 Nicotine dependence, cigarettes, uncomplicated: Secondary | ICD-10-CM | POA: Diagnosis not present

## 2019-09-19 DIAGNOSIS — G5703 Lesion of sciatic nerve, bilateral lower limbs: Secondary | ICD-10-CM | POA: Diagnosis not present

## 2019-09-19 DIAGNOSIS — G894 Chronic pain syndrome: Secondary | ICD-10-CM | POA: Diagnosis not present

## 2019-09-19 DIAGNOSIS — M5387 Other specified dorsopathies, lumbosacral region: Secondary | ICD-10-CM | POA: Diagnosis not present

## 2019-09-19 DIAGNOSIS — M62838 Other muscle spasm: Secondary | ICD-10-CM | POA: Diagnosis not present

## 2019-09-19 DIAGNOSIS — G8929 Other chronic pain: Secondary | ICD-10-CM | POA: Diagnosis not present

## 2019-09-19 DIAGNOSIS — M545 Low back pain, unspecified: Secondary | ICD-10-CM

## 2019-09-19 DIAGNOSIS — M549 Dorsalgia, unspecified: Secondary | ICD-10-CM | POA: Diagnosis present

## 2019-09-19 DIAGNOSIS — M5432 Sciatica, left side: Secondary | ICD-10-CM | POA: Diagnosis not present

## 2019-09-19 MED ORDER — HYDROMORPHONE HCL 1 MG/ML IJ SOLN
1.0000 mg | Freq: Once | INTRAMUSCULAR | Status: AC
  Administered 2019-09-19: 1 mg via INTRAMUSCULAR
  Filled 2019-09-19: qty 1

## 2019-09-19 NOTE — ED Provider Notes (Signed)
[facility] DEPT Provider Note   CSN: [id number] Arrival date & time: 09/19/19  1724     History   Chief Complaint Chief Complaint  Patient presents with  . Back Pain    HPI Gina [name] is a 43 y.o. female.     43 year old female with prior medical history as detailed below presents with complaint of back pain.  Patient with longstanding history of back pain.  She reports piriformis injection performed approximately 24 hours ago.  She reports increased pain after the injection.  She presents tonight requesting both IV Dilaudid and IV ketamine for treatment of her chronic pain.  She is otherwise without complaint.  Chart review does suggest that there may be an element of drug-seeking in her behavior tonight.  She is without associated fever, numbness, weakness, bowel movement change, urinary change, or other red flag symptoms.   The history is provided by the patient and medical records.  Back Pain Location:  Lumbar spine Quality:  Aching Radiates to:  Does not radiate Pain severity:  Mild Pain is:  Same all the time Onset quality:  Unable to specify Timing:  Constant Progression:  Waxing and waning Chronicity:  Chronic   Past Medical History:  Diagnosis Date  . ABDOMINAL WALL HERNIA 02/27/2010  . Abnormal Pap smear   . ALLERGIC RHINITIS 06/02/2007  . ANOREXIA, CHRONIC 08/07/2008  . ANXIETY 09/15/2010  . Arthritis   . BACK PAIN, THORACIC REGION 07/07/2007  . Bipolar disorder ([place])   . CERVICAL RADICULOPATHY 12/11/2008  . Condyloma acuminatum 04/23/2009  . CONSTIPATION 09/15/2010  . DYSPHAGIA UNSPECIFIED 09/09/2009  . 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 Eating disorder   . ENDOMETRIOSIS 12/16/2009  . FATIGUE 06/11/2010  . Fibromyalgia   . GERD 10/23/2009  . Heart palpitations   . HERPES SIMPLEX INFECTION 06/11/2010  . LENTIGO 04/23/2009  . LUNG NODULE 06/17/2010  . Narcotic abuse, continuous ([place])   . Ovarian cyst   . Palpitations  10/23/2009  . Panic disorder   . Renal disorder   . Rheumatoid arthritis(714.0)   . Stricture and stenosis of esophagus 05/13/2010  . TOBACCO ABUSE 08/07/2008  . Urinary tract infection   . UTI (urinary tract infection)     Patient Active Problem List   Diagnosis Date Noted  . Piriformis syndrome of both sides 05/31/2019  . History of bipolar disorder 04/18/2019  . Tardive dyskinesia 04/18/2019  . Urticaria due to drug allergy 04/18/2019  . Lumbar spondylosis 02/05/2019  . Bipolar affective disorder, current episode mixed ([place]) 01/02/2019  . Arthritis 01/02/2019  . Cigarette nicotine dependence without complication 123XX123  . Bilateral temporomandibular joint pain 10/02/2018  . Referred otalgia, bilateral 10/02/2018  . Paresthesias 03/09/2018  . Common migraine 04/22/2016  . Bipolar 1 disorder, mixed, moderate (HCC) 07/15/2015    Class: Chronic  . 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 Sciatica 01/09/2015  . Reactive hypoglycemia 12/17/2013  . Musculoskeletal malfunction arising from mental factors 04/04/2013  . Incisional hernia 11/15/2012  . Other postprocedural status(V45.89) 11/15/2012  . S/P hernia surgery 11/15/2012  . Abdominal pain 11/11/2012  . Chronic pain syndrome 11/11/2012  . Disorder of sacrum 11/11/2012  . Heartburn 10/04/2011  . DYSPHAGIA 10/04/2011  . Depression with anxiety 09/15/2010  . CONSTIPATION 09/15/2010  . LUNG NODULE 06/17/2010  . Herpes simplex virus (HSV) infection 06/11/2010  . FATIGUE 06/11/2010  . STRICTURE AND STENOSIS OF ESOPHAGUS 05/13/2010  . ABDOMINAL WALL HERNIA 02/27/2010  . ENDOMETRIOSIS 12/16/2009  .  GERD 10/23/2009  . PALPITATIONS 10/23/2009  . 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 CONDYLOMA ACUMINATUM 04/23/2009  . LENTIGO 04/23/2009  . DENTAL PAIN 03/19/2009  . CERVICAL RADICULOPATHY 12/11/2008  . Cervical radiculopathy 12/11/2008  . PANIC DISORDER WITHOUT  AGORAPHOBIA 08/07/2008  . TOBACCO ABUSE 08/07/2008  . BACK PAIN, THORACIC REGION 07/07/2007  . ALLERGIC RHINITIS 06/02/2007    Past Surgical History:  Procedure Laterality Date  . ABDOMINAL SURGERY    . ABDOMINAL WALL MESH  REMOVAL    . ABLATION ON ENDOMETRIOSIS    . CESAREAN SECTION     x2   . DILATION AND CURETTAGE OF UTERUS     x1   . ENDOMETRIAL ABLATION    . esophageal dilatation x 4    . GUM SURGERY    . HERNIA REPAIR     X3  . PARTIAL HYSTERECTOMY       OB History    Gravida  3   Para  2   Term  2   Preterm  0   AB  1   Living  2     SAB  1   TAB  0   Ectopic  0   Multiple  0   Live Births  1            Home Medications    Prior to Admission medications   Medication Sig Start Date End Date Taking? Authorizing Provider  clonazePAM (KLONOPIN) 0.5 MG tablet Take 0.5 mg by mouth 2 (two) times daily.  08/30/18   [provider]  cyclobenzaprine (FLEXERIL) 10 MG tablet Take 10 mg by mouth 3 (three) times daily as needed for muscle spasms.  01/19/19   [provider]  escitalopram (LEXAPRO) 20 MG tablet Take 30 mg by mouth at bedtime. 12/20/18   [provider]  lurasidone (LATUDA) 80 MG TABS tablet Take 80 mg by mouth at bedtime.     [provider]  methylPREDNISolone (MEDROL DOSEPAK) 4 MG TBPK tablet See instructions on packet 07/24/19   Gareth Morgan, MD  naloxegol oxalate (MOVANTIK) 25 MG TABS tablet Take 25 mg by mouth daily as needed (constipation).     [provider]  oxyCODONE-acetaminophen (PERCOCET) 7.5-325 MG tablet Take 1 tablet by mouth 4 (four) times daily.    [provider]  REXULTI 1 MG TABS Take 1 tablet by mouth daily.  01/19/19   [provider]  tiaGABine (GABITRIL) 4 MG tablet Take 4 mg by mouth at bedtime. 06/25/19   [provider]    Family History Family History  Problem Relation Age of Onset  . Depression Mother   . Arthritis Mother   .  Hyperlipidemia Father   . Hypertension Father     Social History Social History   Tobacco Use  . Smoking status: Current Every Day Smoker    Packs/day: 1.00    Types: Cigarettes  . Smokeless tobacco: Never Used  Substance Use Topics  . Alcohol use: No  . Drug use: No     Allergies   Lamictal [lamotrigine], Morphine and related, Nicotine, Zofran, Chantix [varenicline tartrate], Codeine, Haldol [haloperidol], Ibuprofen, Lidoderm [lidocaine], Metaxalone, Sulfa antibiotics, Sulfasalazine, Toradol [ketorolac tromethamine], Duloxetine, Gabapentin, Ingrezza [valbenazine tosylate], Methadone, Pregabalin, Zolpidem, Amoxicillin, Aripiprazole, Penicillins, and Valbenazine   Review of Systems Review of Systems  Musculoskeletal: Positive for back pain.  All other systems reviewed and are negative.    Physical Exam Updated Vital Signs BP Marland Kitchen)  152/109 (BP Location: Left Arm)   Pulse (!) 107   Temp 99.5 F (37.5 C) (Oral)   LMP 01/01/2004   SpO2 96%   Physical Exam Vitals signs and nursing note reviewed.  Constitutional:      General: She is not in acute distress.    Appearance: Normal appearance. She is well-developed.  HENT:     Head: Normocephalic and atraumatic.  Eyes:     Conjunctiva/sclera: Conjunctivae normal.     Pupils: Pupils are equal, round, and reactive to light.  Neck:     Musculoskeletal: Normal range of motion and neck supple.  Cardiovascular:     Rate and Rhythm: Normal rate and regular rhythm.     Heart sounds: Normal heart sounds.  Pulmonary:     Effort: Pulmonary effort is normal. No respiratory distress.     Breath sounds: Normal breath sounds.  Abdominal:     General: There is no distension.     Palpations: Abdomen is soft.     Tenderness: There is no abdominal tenderness.  Musculoskeletal: Normal range of motion.        General: No deformity.  Skin:    General: Skin is warm and dry.  Neurological:     Mental Status: She is alert and oriented to  person, place, and time.      ED Treatments / Results  Labs (all labs ordered are listed, but only abnormal results are displayed) Labs Reviewed - No data to display  EKG None  Radiology No results found.  Procedures Procedures (including critical care time)  Medications Ordered in ED Medications  HYDROmorphone (DILAUDID) injection 1 mg (1 mg Intramuscular Given 09/19/19 2011)     Initial Impression / Assessment and Plan / ED Course  I have reviewed the triage vital signs and the nursing notes.  Pertinent labs & imaging results that were available during my care of the patient were reviewed by me and considered in my medical decision making (see chart for details).        MDM  Screen complete  Gina [name] was evaluated in Emergency Department on 09/19/2019 for the symptoms described in the history of present illness. She was evaluated in the context of the global COVID-19 pandemic, which necessitated consideration that the patient might be at risk for infection with the SARS-CoV-2 virus that causes COVID-19. Institutional protocols and algorithms that pertain to the evaluation of patients at risk for COVID-19 are in a state of rapid change based on information released by regulatory bodies including the CDC and federal and state organizations. These policies and algorithms were followed during the patient's care in the ED.  Patient is presenting with complaint of increased chronic pain after recent piriformis injection.  Patient has documented history of likely drug-seeking behavior.  She is without red flag symptoms suggesting more acute pathology.  She repeatedly asked and demanded for IV Dilaudid and IV ketamine.  She is advised that receiving a dose of Dilaudid (IM) tonight while in the ED may violate her established pain contract.   She is appropriate for outpatient discharge and continued management with her already established pain clinic.   Importance of close follow-up is repeatedly stressed.  Strict return precautions given and understood.  Final Clinical Impressions(s) / ED Diagnoses   Final diagnoses:  Chronic low back pain, unspecified back pain laterality, unspecified whether sciatica present    ED Discharge Orders    None       [place], Wallis Bamberg, MD  09/19/19 2033  

## 2019-09-19 NOTE — ED Triage Notes (Addendum)
Patient reports chronic back pain.  Patient states she got a shot yesterday and since then she has been in severe pain.   Patient reports going to physical therapy yesterday and unable to complete exercises due to the pain.   Please see past ED notes for past visits. (drug seeking behavior?)  A/Ox4 Wheelchair in triage.   Patient reports she has anxiety.

## 2019-09-19 NOTE — Discharge Instructions (Addendum)
Please return for any problem.  Follow-up with your regular pain doctor as instructed.

## 2019-09-19 NOTE — Progress Notes (Signed)
Chart reviewed:  12 ED visits in 6 months  Chart reviewed, pt has multiple ED visits for back pain. She is set up with Outpt PT. Recently followed by [facility] for assistance with transportation. [place], [place] ED TOC CM [phone]

## 2019-09-24 DIAGNOSIS — M5432 Sciatica, left side: Secondary | ICD-10-CM | POA: Diagnosis not present

## 2019-09-24 DIAGNOSIS — M5387 Other specified dorsopathies, lumbosacral region: Secondary | ICD-10-CM | POA: Diagnosis not present

## 2019-09-24 DIAGNOSIS — M62838 Other muscle spasm: Secondary | ICD-10-CM | POA: Diagnosis not present

## 2019-09-24 DIAGNOSIS — M5442 Lumbago with sciatica, left side: Secondary | ICD-10-CM | POA: Diagnosis not present

## 2019-09-24 DIAGNOSIS — Z9189 Other specified personal risk factors, not elsewhere classified: Secondary | ICD-10-CM | POA: Diagnosis not present

## 2019-09-24 DIAGNOSIS — G8929 Other chronic pain: Secondary | ICD-10-CM | POA: Diagnosis not present

## 2019-09-26 DIAGNOSIS — M5432 Sciatica, left side: Secondary | ICD-10-CM | POA: Diagnosis not present

## 2019-09-26 DIAGNOSIS — M5387 Other specified dorsopathies, lumbosacral region: Secondary | ICD-10-CM | POA: Diagnosis not present

## 2019-09-26 DIAGNOSIS — M62838 Other muscle spasm: Secondary | ICD-10-CM | POA: Diagnosis not present

## 2019-10-01 DIAGNOSIS — M5432 Sciatica, left side: Secondary | ICD-10-CM | POA: Diagnosis not present

## 2019-10-01 DIAGNOSIS — M62838 Other muscle spasm: Secondary | ICD-10-CM | POA: Diagnosis not present

## 2019-10-01 DIAGNOSIS — M5387 Other specified dorsopathies, lumbosacral region: Secondary | ICD-10-CM | POA: Diagnosis not present

## 2019-10-04 DIAGNOSIS — M5387 Other specified dorsopathies, lumbosacral region: Secondary | ICD-10-CM | POA: Diagnosis not present

## 2019-10-04 DIAGNOSIS — M5432 Sciatica, left side: Secondary | ICD-10-CM | POA: Diagnosis not present

## 2019-10-04 DIAGNOSIS — M62838 Other muscle spasm: Secondary | ICD-10-CM | POA: Diagnosis not present

## 2019-10-05 DIAGNOSIS — M546 Pain in thoracic spine: Secondary | ICD-10-CM | POA: Diagnosis not present

## 2019-10-05 DIAGNOSIS — M5442 Lumbago with sciatica, left side: Secondary | ICD-10-CM | POA: Diagnosis not present

## 2019-10-05 DIAGNOSIS — M797 Fibromyalgia: Secondary | ICD-10-CM | POA: Diagnosis not present

## 2019-10-05 DIAGNOSIS — M7918 Myalgia, other site: Secondary | ICD-10-CM | POA: Diagnosis not present

## 2019-10-05 DIAGNOSIS — G8929 Other chronic pain: Secondary | ICD-10-CM | POA: Diagnosis not present

## 2019-10-05 DIAGNOSIS — M25519 Pain in unspecified shoulder: Secondary | ICD-10-CM | POA: Diagnosis not present

## 2019-10-09 DIAGNOSIS — M62838 Other muscle spasm: Secondary | ICD-10-CM | POA: Diagnosis not present

## 2019-10-09 DIAGNOSIS — M5387 Other specified dorsopathies, lumbosacral region: Secondary | ICD-10-CM | POA: Diagnosis not present

## 2019-10-09 DIAGNOSIS — M5432 Sciatica, left side: Secondary | ICD-10-CM | POA: Diagnosis not present

## 2019-10-12 DIAGNOSIS — M5432 Sciatica, left side: Secondary | ICD-10-CM | POA: Diagnosis not present

## 2019-10-12 DIAGNOSIS — M62838 Other muscle spasm: Secondary | ICD-10-CM | POA: Diagnosis not present

## 2019-10-12 DIAGNOSIS — M5387 Other specified dorsopathies, lumbosacral region: Secondary | ICD-10-CM | POA: Diagnosis not present

## 2019-10-12 NOTE — Telephone Encounter (Signed)
Please Advise. Do you know where this pts paperwork is at. Pk/CMA

## 2019-10-16 DIAGNOSIS — M62838 Other muscle spasm: Secondary | ICD-10-CM | POA: Diagnosis not present

## 2019-10-16 DIAGNOSIS — M5387 Other specified dorsopathies, lumbosacral region: Secondary | ICD-10-CM | POA: Diagnosis not present

## 2019-10-16 DIAGNOSIS — M5432 Sciatica, left side: Secondary | ICD-10-CM | POA: Diagnosis not present

## 2019-10-18 DIAGNOSIS — M5432 Sciatica, left side: Secondary | ICD-10-CM | POA: Diagnosis not present

## 2019-10-18 DIAGNOSIS — M5387 Other specified dorsopathies, lumbosacral region: Secondary | ICD-10-CM | POA: Diagnosis not present

## 2019-10-18 DIAGNOSIS — M62838 Other muscle spasm: Secondary | ICD-10-CM | POA: Diagnosis not present

## 2019-10-18 NOTE — Telephone Encounter (Signed)
Yes please giver verbal ok for PT, thanks

## 2019-10-18 NOTE — Telephone Encounter (Signed)
Verbal order for PT was given for to [facility] with [facility]

## 2019-10-22 DIAGNOSIS — G8929 Other chronic pain: Secondary | ICD-10-CM | POA: Diagnosis not present

## 2019-10-22 DIAGNOSIS — Z9189 Other specified personal risk factors, not elsewhere classified: Secondary | ICD-10-CM | POA: Diagnosis not present

## 2019-10-22 DIAGNOSIS — M5442 Lumbago with sciatica, left side: Secondary | ICD-10-CM | POA: Diagnosis not present

## 2019-10-29 DIAGNOSIS — M5432 Sciatica, left side: Secondary | ICD-10-CM | POA: Diagnosis not present

## 2019-10-29 DIAGNOSIS — M5387 Other specified dorsopathies, lumbosacral region: Secondary | ICD-10-CM | POA: Diagnosis not present

## 2019-10-29 DIAGNOSIS — M62838 Other muscle spasm: Secondary | ICD-10-CM | POA: Diagnosis not present

## 2019-10-31 DIAGNOSIS — Z9189 Other specified personal risk factors, not elsewhere classified: Secondary | ICD-10-CM | POA: Diagnosis not present

## 2019-10-31 DIAGNOSIS — M5442 Lumbago with sciatica, left side: Secondary | ICD-10-CM | POA: Diagnosis not present

## 2019-10-31 DIAGNOSIS — G8929 Other chronic pain: Secondary | ICD-10-CM | POA: Diagnosis not present

## 2019-11-05 DIAGNOSIS — M797 Fibromyalgia: Secondary | ICD-10-CM | POA: Diagnosis not present

## 2019-11-05 DIAGNOSIS — M546 Pain in thoracic spine: Secondary | ICD-10-CM | POA: Diagnosis not present

## 2019-11-05 DIAGNOSIS — G8929 Other chronic pain: Secondary | ICD-10-CM | POA: Diagnosis not present

## 2019-11-05 DIAGNOSIS — M5387 Other specified dorsopathies, lumbosacral region: Secondary | ICD-10-CM | POA: Diagnosis not present

## 2019-11-05 DIAGNOSIS — M5432 Sciatica, left side: Secondary | ICD-10-CM | POA: Diagnosis not present

## 2019-11-05 DIAGNOSIS — M62838 Other muscle spasm: Secondary | ICD-10-CM | POA: Diagnosis not present

## 2019-11-05 DIAGNOSIS — M5442 Lumbago with sciatica, left side: Secondary | ICD-10-CM | POA: Diagnosis not present

## 2019-11-05 DIAGNOSIS — M25519 Pain in unspecified shoulder: Secondary | ICD-10-CM | POA: Diagnosis not present

## 2019-11-05 DIAGNOSIS — M7918 Myalgia, other site: Secondary | ICD-10-CM | POA: Diagnosis not present

## 2019-11-13 DIAGNOSIS — M62838 Other muscle spasm: Secondary | ICD-10-CM | POA: Diagnosis not present

## 2019-11-13 DIAGNOSIS — M5432 Sciatica, left side: Secondary | ICD-10-CM | POA: Diagnosis not present

## 2019-11-13 DIAGNOSIS — M5387 Other specified dorsopathies, lumbosacral region: Secondary | ICD-10-CM | POA: Diagnosis not present

## 2019-11-20 DIAGNOSIS — M5432 Sciatica, left side: Secondary | ICD-10-CM | POA: Diagnosis not present

## 2019-11-20 DIAGNOSIS — M62838 Other muscle spasm: Secondary | ICD-10-CM | POA: Diagnosis not present

## 2019-11-20 DIAGNOSIS — M5387 Other specified dorsopathies, lumbosacral region: Secondary | ICD-10-CM | POA: Diagnosis not present

## 2019-11-26 DIAGNOSIS — M5432 Sciatica, left side: Secondary | ICD-10-CM | POA: Diagnosis not present

## 2019-11-26 DIAGNOSIS — M62838 Other muscle spasm: Secondary | ICD-10-CM | POA: Diagnosis not present

## 2019-11-26 DIAGNOSIS — M5387 Other specified dorsopathies, lumbosacral region: Secondary | ICD-10-CM | POA: Diagnosis not present

## 2019-12-04 DIAGNOSIS — E7800 Pure hypercholesterolemia, unspecified: Secondary | ICD-10-CM | POA: Diagnosis not present

## 2019-12-04 DIAGNOSIS — Z1159 Encounter for screening for other viral diseases: Secondary | ICD-10-CM | POA: Diagnosis not present

## 2019-12-04 DIAGNOSIS — Z79899 Other long term (current) drug therapy: Secondary | ICD-10-CM | POA: Diagnosis not present

## 2019-12-04 DIAGNOSIS — E559 Vitamin D deficiency, unspecified: Secondary | ICD-10-CM | POA: Diagnosis not present

## 2019-12-04 DIAGNOSIS — M5442 Lumbago with sciatica, left side: Secondary | ICD-10-CM | POA: Diagnosis not present

## 2019-12-04 DIAGNOSIS — G8929 Other chronic pain: Secondary | ICD-10-CM | POA: Diagnosis not present

## 2019-12-05 DIAGNOSIS — M5387 Other specified dorsopathies, lumbosacral region: Secondary | ICD-10-CM | POA: Diagnosis not present

## 2019-12-05 DIAGNOSIS — M62838 Other muscle spasm: Secondary | ICD-10-CM | POA: Diagnosis not present

## 2019-12-05 DIAGNOSIS — M5432 Sciatica, left side: Secondary | ICD-10-CM | POA: Diagnosis not present

## 2019-12-06 ENCOUNTER — Ambulatory Visit (INDEPENDENT_AMBULATORY_CARE_PROVIDER_SITE_OTHER): Payer: Medicare Other | Admitting: Family Medicine

## 2019-12-06 VITALS — BP 100/72 | Ht 67.0 in | Wt 155.0 lb

## 2019-12-06 DIAGNOSIS — G8929 Other chronic pain: Secondary | ICD-10-CM | POA: Diagnosis not present

## 2019-12-06 DIAGNOSIS — Z0000 Encounter for general adult medical examination without abnormal findings: Secondary | ICD-10-CM | POA: Diagnosis not present

## 2019-12-06 DIAGNOSIS — M7918 Myalgia, other site: Secondary | ICD-10-CM | POA: Diagnosis not present

## 2019-12-06 DIAGNOSIS — M5442 Lumbago with sciatica, left side: Secondary | ICD-10-CM | POA: Diagnosis not present

## 2019-12-06 DIAGNOSIS — M25519 Pain in unspecified shoulder: Secondary | ICD-10-CM | POA: Diagnosis not present

## 2019-12-06 DIAGNOSIS — M546 Pain in thoracic spine: Secondary | ICD-10-CM | POA: Diagnosis not present

## 2019-12-06 DIAGNOSIS — M797 Fibromyalgia: Secondary | ICD-10-CM | POA: Diagnosis not present

## 2019-12-06 NOTE — Progress Notes (Signed)
Presents today for [employer] Visit   Date of last exam: 07/16/2019  Interpreter used for this visit? No  I connected with  Gina [name] on 12/06/19 by a telephone and verified that I am speaking with the correct person using two identifiers.   I discussed the limitations of evaluation and management by telemedicine. The patient expressed understanding and agreed to proceed.    Patient Care Team: Rutherford Guys, MD as PCP - General (Family Medicine) Celene Squibb., MD as Referring Physician (Neurology) Ricard Dillon, MD (Psychiatry) Maisie Fus, MD as Consulting Physician (Obstetrics and Gynecology)   Other items to address today:   Discussed Eye/Dental Discussed immunizations     ADVANCE DIRECTIVES: Discussed: YES On File: NO Materials Provided: YES  Immunization status:  Immunization History  Administered Date(s) Administered  . Hepatitis B, adult 05/30/2015  . Influenza Whole 08/26/2009  . Influenza,inj,Quad PF,6+ Mos 07/16/2019  . PPD Test 05/28/2015  . Td 11/01/2005     Health Maintenance Due  Topic Date Due  . PAP SMEAR-Modifier  03/02/2018     Functional Status Survey: Is the patient deaf or have difficulty hearing?: No Does the patient have difficulty seeing, even when wearing glasses/contacts?: No Does the patient have difficulty concentrating, remembering, or making decisions?: No Does the patient have difficulty walking or climbing stairs?: No Does the patient have difficulty dressing or bathing?: No Does the patient have difficulty doing errands alone such as visiting a doctor's office or shopping?: No   6CIT Screen 12/06/2019  What Year? 0 points  What month? 0 points  What time? 0 points  Count back from 20 0 points  Months in reverse 0 points  Repeat phrase 0 points  Total Score 0        Clinical Support from 12/06/2019 in [facility]  AUDIT-C Score  1       Home Environment:    Lives in one story home with husband No difficulty climbing stairs Yes grab bars No scattered rugs Adequate lighting/no clutter  Timed warm up N/A   Patient Active Problem List   Diagnosis Date Noted  . Piriformis syndrome of both sides 05/31/2019  . History of bipolar disorder 04/18/2019  . Tardive dyskinesia 04/18/2019  . Urticaria due to drug allergy 04/18/2019  . Lumbar spondylosis 02/05/2019  . Bipolar affective disorder, current episode mixed ([place]) 01/02/2019  . Arthritis 01/02/2019  . Cigarette nicotine dependence without complication [id number]  . Bilateral temporomandibular joint pain 10/02/2018  . Referred otalgia, bilateral 10/02/2018  . Paresthesias 03/09/2018  . Common migraine 04/22/2016  . Bipolar 1 disorder, mixed, moderate ([facility]) 07/15/2015    Class: Chronic  . 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 Sciatica 01/09/2015  . Reactive hypoglycemia 12/17/2013  . Musculoskeletal malfunction arising from mental factors 04/04/2013  . Incisional hernia 11/15/2012  . Other postprocedural status(V45.89) 11/15/2012  . S/P hernia surgery 11/15/2012  . Abdominal pain 11/11/2012  . Chronic pain syndrome 11/11/2012  . Disorder of sacrum 11/11/2012  . Heartburn 10/04/2011  . DYSPHAGIA 10/04/2011  . Depression with anxiety 09/15/2010  . CONSTIPATION 09/15/2010  . LUNG NODULE 06/17/2010  . Herpes simplex virus (HSV) infection 06/11/2010  . FATIGUE 06/11/2010  . STRICTURE AND STENOSIS OF ESOPHAGUS 05/13/2010  . ABDOMINAL WALL HERNIA 02/27/2010  . ENDOMETRIOSIS 12/16/2009  . GERD 10/23/2009  . PALPITATIONS 10/23/2009  . 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  CONDYLOMA ACUMINATUM 04/23/2009  . LENTIGO 04/23/2009  . DENTAL PAIN 03/19/2009  . CERVICAL RADICULOPATHY 12/11/2008  . Cervical radiculopathy 12/11/2008  . 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 TOBACCO ABUSE 08/07/2008  . BACK PAIN,  THORACIC REGION 07/07/2007  . ALLERGIC RHINITIS 06/02/2007     Past Medical History:  Diagnosis Date  . ABDOMINAL WALL HERNIA 02/27/2010  . Abnormal Pap smear   . ALLERGIC RHINITIS 06/02/2007  . ANOREXIA, CHRONIC 08/07/2008  . ANXIETY 09/15/2010  . Arthritis   . BACK PAIN, THORACIC REGION 07/07/2007  . Bipolar disorder ([place])   . CERVICAL RADICULOPATHY 12/11/2008  . Condyloma acuminatum 04/23/2009  . CONSTIPATION 09/15/2010  . DYSPHAGIA UNSPECIFIED 09/09/2009  . 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 Eating disorder   . ENDOMETRIOSIS 12/16/2009  . FATIGUE 06/11/2010  . Fibromyalgia   . GERD 10/23/2009  . Heart palpitations   . HERPES SIMPLEX INFECTION 06/11/2010  . LENTIGO 04/23/2009  . LUNG NODULE 06/17/2010  . Narcotic abuse, continuous ([place])   . Ovarian cyst   . Palpitations 10/23/2009  . Panic disorder   . Renal disorder   . Rheumatoid arthritis(714.0)   . Stricture and stenosis of esophagus 05/13/2010  . TOBACCO ABUSE 08/07/2008  . Urinary tract infection   . UTI (urinary tract infection)      Past Surgical History:  Procedure Laterality Date  . ABDOMINAL SURGERY    . ABDOMINAL WALL MESH  REMOVAL    . ABLATION ON ENDOMETRIOSIS    . CESAREAN SECTION     x2   . DILATION AND CURETTAGE OF UTERUS     x1   . ENDOMETRIAL ABLATION    . esophageal dilatation x 4    . GUM SURGERY    . HERNIA REPAIR     X3  . PARTIAL HYSTERECTOMY       Family History  Problem Relation Age of Onset  . Depression Mother   . Arthritis Mother   . Hyperlipidemia Father   . Hypertension Father      Social History   Socioeconomic History  . Marital status: Married    Spouse name: Not on file  . Number of children: Not on file  . Years of education: Not on file  . Highest education level: Not on file  Occupational History  . Not on file  Tobacco Use  . Smoking status: Current Every Day Smoker    Packs/day: 1.00    Types: Cigarettes  . Smokeless tobacco: Never Used  Substance and Sexual  Activity  . Alcohol use: No  . Drug use: No  . Sexual activity: Yes    Birth control/protection: Surgical  Other Topics Concern  . Not on file  Social History Narrative  . Not on file   Social Determinants of Health   Financial Resource Strain:   . Difficulty of Paying Living Expenses: Not on file  Food Insecurity:   . Worried About Charity fundraiser in the Last Year: Not on file  . Ran Out of Food in the Last Year: Not on file  Transportation Needs:   . Lack of Transportation (Medical): Not on file  . Lack of Transportation (Non-Medical): Not on file  Physical Activity:   . Days of Exercise per Week: Not on file  . Minutes of Exercise per Session: Not on file  Stress:   . Feeling of Stress : Not on file  Social Connections:   . Frequency of Communication with Friends and Family: Not on file  .  Frequency of Social Gatherings with Friends and Family: Not on file  . Attends Religious Services: Not on file  . Active Member of Clubs or Organizations: Not on file  . Attends Archivist Meetings: Not on file  . Marital Status: Not on file  Intimate Partner Violence:   . Fear of Current or Ex-Partner: Not on file  . Emotionally Abused: Not on file  . Physically Abused: Not on file  . Sexually Abused: Not on file     Allergies  Allergen Reactions  . Lamictal [Lamotrigine] Anaphylaxis and Other (See Comments)    STEVEN JOHNSON'S SYNDROME  . Morphine And Related Other (See Comments)    Makes pt "very mean" "I get mean" Makes pt "very mean" Makes pt "very mean"  . Nicotine Swelling and Palpitations    Other reaction(s): Respiratory Distress (ALLERGY/intolerance), Swelling (ALLERGY/intolerance), Tachycardia / Palpitations  (intolerance) Patches, lozenges Patches caused palpations lozenges throat swelled  . Zofran Anaphylaxis  . Chantix [Varenicline Tartrate] Other (See Comments)     Diaphoresis, sweating.  Night terrors.  "went crazy"  . Codeine Nausea And  Vomiting  . Haldol [Haloperidol] Itching, Rash and Other (See Comments)    FLUSHING  . Ibuprofen Nausea And Vomiting  . Lidoderm [Lidocaine] Other (See Comments)    DOESN'T WORK FOR PATIENT  . Metaxalone Other (See Comments)    Pt does not remember reaction, REAL BAD REACTION  . Sulfa Antibiotics Diarrhea and Other (See Comments)    GI PAINS ALSO  . Sulfasalazine Diarrhea    GI PAINS  . Toradol [Ketorolac Tromethamine] Nausea And Vomiting  . Duloxetine Other (See Comments)    unk  . Gabapentin Nausea And Vomiting  . Ingrezza [Valbenazine Tosylate] Hives  . Methadone     seizures  . Pregabalin     unk  . Zolpidem     Mental status changes  . Amoxicillin Rash    Has patient had a PCN reaction causing immediate rash, facial/tongue/throat swelling, SOB or lightheadedness with hypotension: No Has patient had a PCN reaction causing severe rash involving mucus membranes or skin necrosis: No Has patient had a PCN reaction that required hospitalization: No Has patient had a PCN reaction occurring within the last 10 years: No If all of the above answers are "NO", then may proceed with Cephalosporin use.   . Aripiprazole Anxiety    hallucinations  . Penicillins Rash    Other reaction(s): Urticaria / Hives (ALLERGY) Has patient had a PCN reaction causing immediate rash, facial/tongue/throat swelling, SOB or lightheadedness with hypotension: No Has patient had a PCN reaction causing severe rash involving mucus membranes or skin necrosis: No Has patient had a PCN reaction that required hospitalization: No Has patient had a PCN reaction occurring within the last 10 years: No If all of the above answers are "NO", then may proceed with Cephalosporin use.  Minus [place] Rash     Prior to Admission medications   Medication Sig Start Date End Date Taking? Authorizing Provider  clonazePAM (KLONOPIN) 0.5 MG tablet Take 0.5 mg by mouth 2 (two) times daily.  08/30/18  Yes [provider]  cyclobenzaprine (FLEXERIL) 10 MG tablet Take 10 mg by mouth 3 (three) times daily as needed for muscle spasms.  01/19/19  Yes [provider]  escitalopram (LEXAPRO) 20 MG tablet Take 30 mg by mouth at bedtime. 12/20/18  Yes [provider]  lurasidone (LATUDA) 80 MG TABS tablet Take 80 mg by mouth at bedtime.  Yes [provider]  oxyCODONE-acetaminophen (PERCOCET) 7.5-325 MG tablet Take 1 tablet by mouth 4 (four) times daily.   Yes [provider]  REXULTI 1 MG TABS Take 1 tablet by mouth daily.  01/19/19  Yes [provider]  methylPREDNISolone (MEDROL DOSEPAK) 4 MG TBPK tablet See instructions on packet 07/24/19   Gareth Morgan, MD  naloxegol oxalate (MOVANTIK) 25 MG TABS tablet Take 25 mg by mouth daily as needed (constipation).     [provider]  tiaGABine (GABITRIL) 4 MG tablet Take 4 mg by mouth at bedtime. 06/25/19   [provider]     Depression screen [facility] 2/9 12/06/2019 05/25/2019 03/12/2019 04/14/2016  Decreased Interest 0 0 0 0  Down, Depressed, Hopeless 0 1 0 0  PHQ - 2 Score 0 1 0 0  Some encounter information is confidential and restricted. Go to Review Flowsheets activity to see all data.  Some recent data might be hidden     Fall Risk  12/06/2019 07/16/2019 05/25/2019 04/12/2019 03/12/2019  Falls in the past year? 0 0 1 0 0  Number falls in past yr: 0 0 1 0 0  Injury with Fall? 0 0 1 0 0  Risk for fall due to : - - History of fall(s) - -  Follow up Falls evaluation completed;Education provided - - - -      PHYSICAL EXAM: BP 100/72 Comment: taken from previous visit  Ht 5\' 7"  (1.702 m)   Wt 155 lb (70.3 kg)   LMP 01/01/2004   BMI 24.28 kg/m    Wt Readings from Last 3 Encounters:  12/06/19 155 lb (70.3 kg)  08/11/19 156 lb (70.8 kg)  06/30/19 145 lb 1 oz (65.8 kg)    Medicare annual wellness visit, subsequent   Education/Counseling provided regarding diet and exercise, prevention of chronic  diseases, smoking/tobacco cessation, if applicable, and reviewed "Covered Medicare Preventive Services."

## 2019-12-06 NOTE — Patient Instructions (Addendum)
Thank you for taking time to come for your Medicare Wellness Visit. I appreciate your ongoing commitment to your health goals. Please review the following plan we discussed and let me know if I can assist you in the future.  Leroy Kennedy LPN  Preventive Care 42-42 Years Old, Female Preventive care refers to visits with your health care provider and lifestyle choices that can promote health and wellness. This includes:  A yearly physical exam. This may also be called an annual well check.  Regular dental visits and eye exams.  Immunizations.  Screening for certain conditions.  Healthy lifestyle choices, such as eating a healthy diet, getting regular exercise, not using drugs or products that contain nicotine and tobacco, and limiting alcohol use. What can I expect for my preventive care visit? Physical exam Your health care provider will check your:  Height and weight. This may be used to calculate body mass index (BMI), which tells if you are at a healthy weight.  Heart rate and blood pressure.  Skin for abnormal spots. Counseling Your health care provider may ask you questions about your:  Alcohol, tobacco, and drug use.  Emotional well-being.  Home and relationship well-being.  Sexual activity.  Eating habits.  Work and work Statistician.  Method of birth control.  Menstrual cycle.  Pregnancy history. What immunizations do I need?  Influenza (flu) vaccine  This is recommended every year. Tetanus, diphtheria, and pertussis (Tdap) vaccine  You may need a Td booster every 10 years. Varicella (chickenpox) vaccine  You may need this if you have not been vaccinated. Human papillomavirus (HPV) vaccine  If recommended by your health care provider, you may need three doses over 6 months. Measles, mumps, and rubella (MMR) vaccine  You may need at least one dose of MMR. You may also need a second dose. Meningococcal conjugate (MenACWY) vaccine  One dose is  recommended if you are age 35-21 years and a first-year college student living in a residence hall, or if you have one of several medical conditions. You may also need additional booster doses. Pneumococcal conjugate (PCV13) vaccine  You may need this if you have certain conditions and were not previously vaccinated. Pneumococcal polysaccharide (PPSV23) vaccine  You may need one or two doses if you smoke cigarettes or if you have certain conditions. Hepatitis A vaccine  You may need this if you have certain conditions or if you travel or work in places where you may be exposed to hepatitis A. Hepatitis B vaccine  You may need this if you have certain conditions or if you travel or work in places where you may be exposed to hepatitis B. Haemophilus influenzae type b (Hib) vaccine  You may need this if you have certain conditions. You may receive vaccines as individual doses or as more than one vaccine together in one shot (combination vaccines). Talk with your health care provider about the risks and benefits of combination vaccines. What tests do I need?  Blood tests  Lipid and cholesterol levels. These may be checked every 5 years starting at age 21.  Hepatitis C test.  Hepatitis B test. Screening  Diabetes screening. This is done by checking your blood sugar (glucose) after you have not eaten for a while (fasting).  Sexually transmitted disease (STD) testing.  BRCA-related cancer screening. This may be done if you have a family history of breast, ovarian, tubal, or peritoneal cancers.  Pelvic exam and Pap test. This may be done every 3 years starting at  age 77. Starting at age 11, this may be done every 5 years if you have a Pap test in combination with an HPV test. Talk with your health care provider about your test results, treatment options, and if necessary, the need for more tests. Follow these instructions at home: Eating and drinking   Eat a diet that includes fresh  fruits and vegetables, whole grains, lean protein, and low-fat dairy.  Take vitamin and mineral supplements as recommended by your health care provider.  Do not drink alcohol if: ? Your health care provider tells you not to drink. ? You are pregnant, may be pregnant, or are planning to become pregnant.  If you drink alcohol: ? Limit how much you have to 0-1 drink a day. ? Be aware of how much alcohol is in your drink. In the [place] one drink equals one 12 oz bottle of beer (355 mL), one 5 oz glass of wine (148 mL), or one 1 oz glass of hard liquor (44 mL). Lifestyle  Take daily care of your teeth and gums.  Stay active. Exercise for at least 30 minutes on 5 or more days each week.  Do not use any products that contain nicotine or tobacco, such as cigarettes, e-cigarettes, and chewing tobacco. If you need help quitting, ask your health care provider.  If you are sexually active, practice safe sex. Use a condom or other form of birth control (contraception) in order to prevent pregnancy and STIs (sexually transmitted infections). If you plan to become pregnant, see your health care provider for a preconception visit. What's next?  Visit your health care provider once a year for a well check visit.  Ask your health care provider how often you should have your eyes and teeth checked.  Stay up to date on all vaccines. This information is not intended to replace advice given to you by your health care provider. Make sure you discuss any questions you have with your health care provider. Document Revised: 06/29/2018 Document Reviewed: 06/29/2018 Elsevier Patient Education  2020 [employer].

## 2019-12-20 IMAGING — CT CT ABDOMEN AND PELVIS WITH CONTRAST
2 of 5 series · 16 of 46 positions shown, 18 images · IV contrast (ISOVUE)
Comparison: CT abdomen pelvis dated 07/02/2015

CLINICAL DATA: 43-year-old female with left lower quadrant
abdominal pain.

EXAM:
CT ABDOMEN AND PELVIS WITH CONTRAST
TECHNIQUE: Multidetector CT imaging of the abdomen and pelvis was performed
using the standard protocol following bolus administration of
intravenous contrast.
CONTRAST:  100mL OMNIPAQUE IOHEXOL 300 MG/ML  SOLN

[Series 2: axial st · axial · 0.71mm/px · z∈[-446,-66]mm · 13 of 88 slices shown, 15 images]
[im 6/88  soft-tissue]
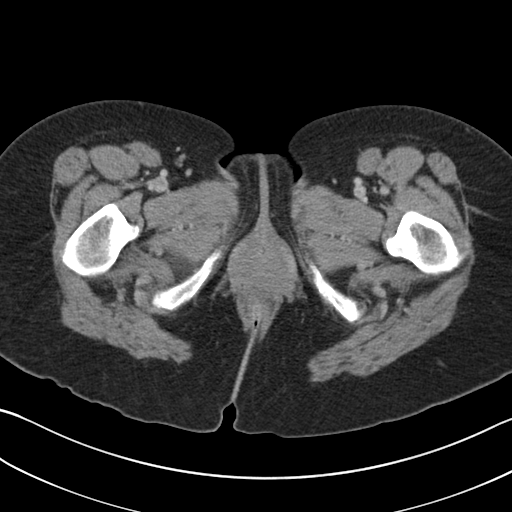
[im 6/88  bone]
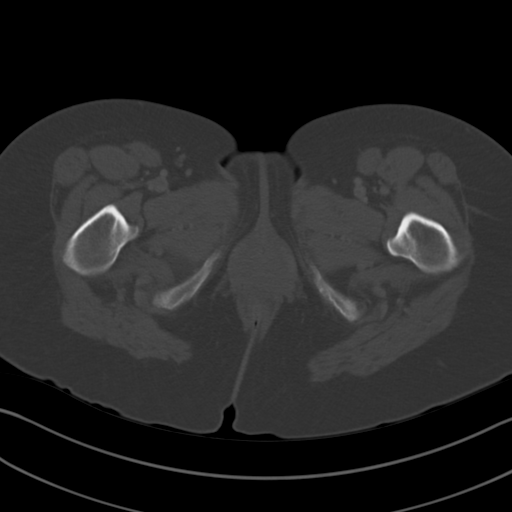
[im 11/88  soft-tissue]
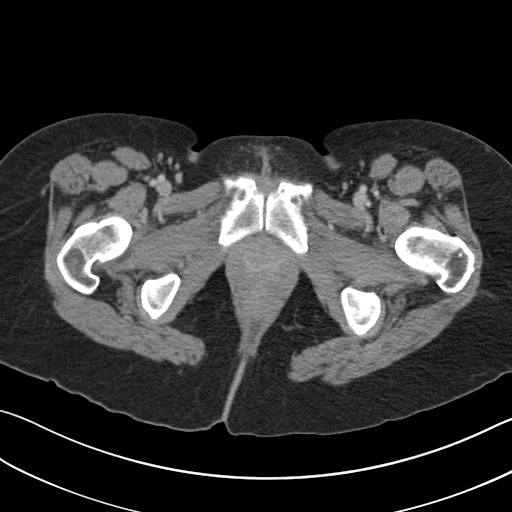
[im 17/88  soft-tissue]
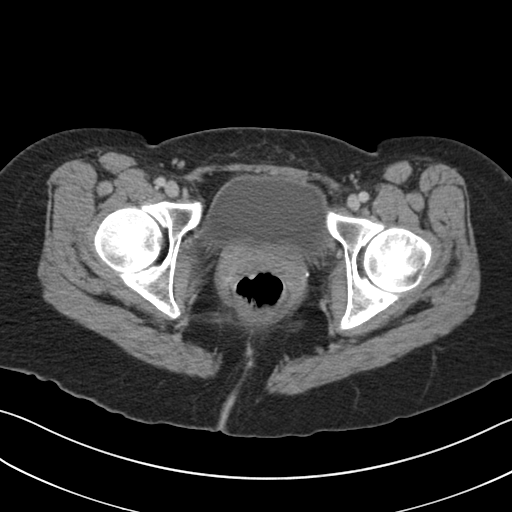
[im 28/88  soft-tissue]
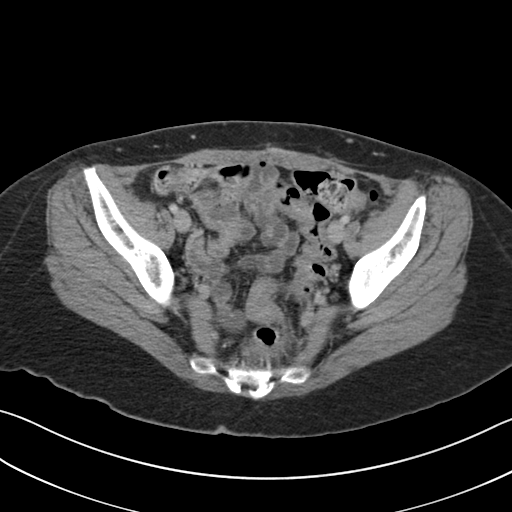
[im 33/88  soft-tissue]
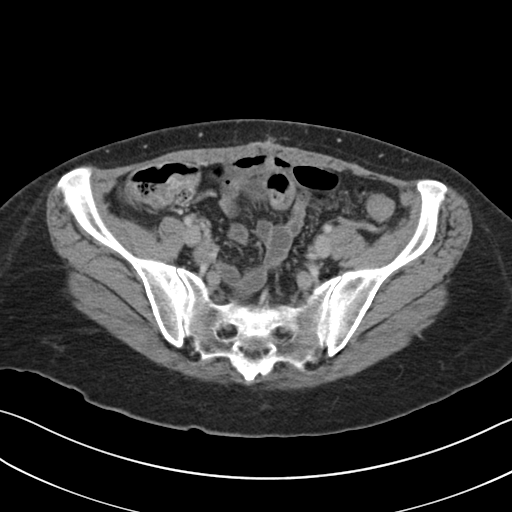
[im 39/88  soft-tissue]
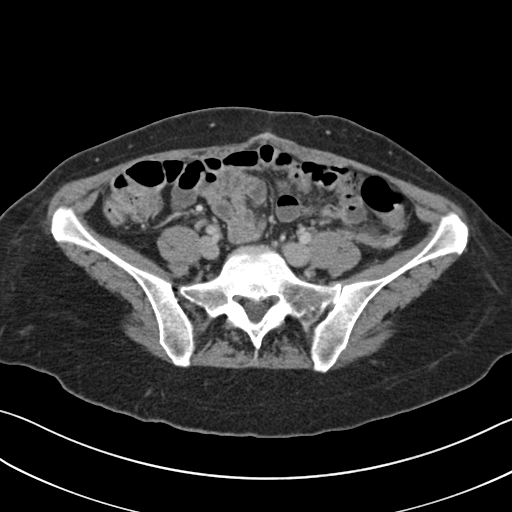
[im 44/88  soft-tissue]
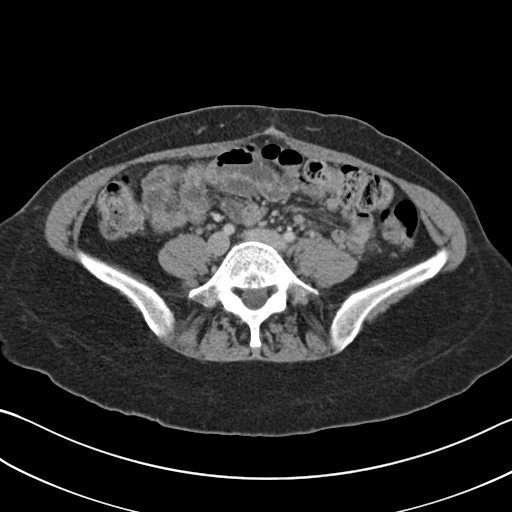
[im 49/88  soft-tissue]
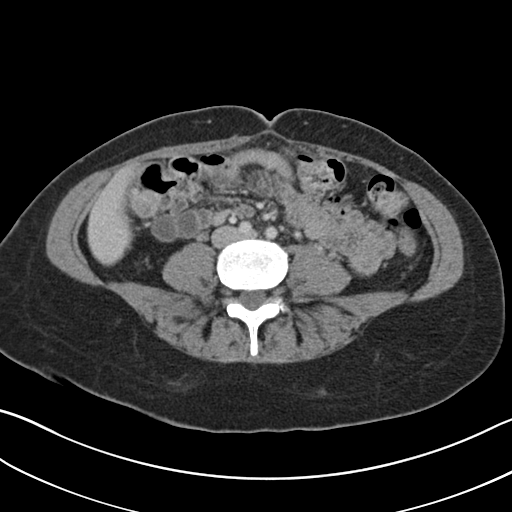
[im 55/88  soft-tissue]
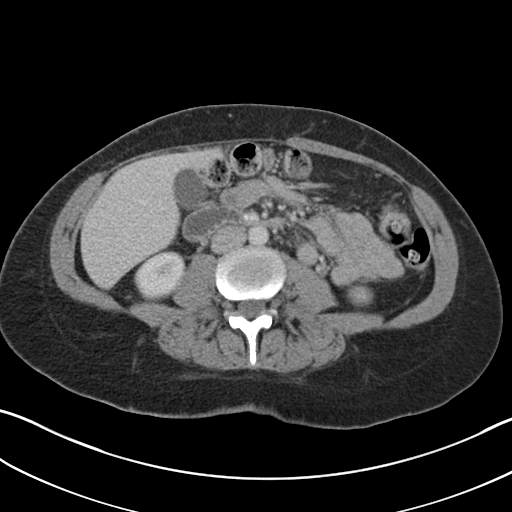
[im 55/88  bone]
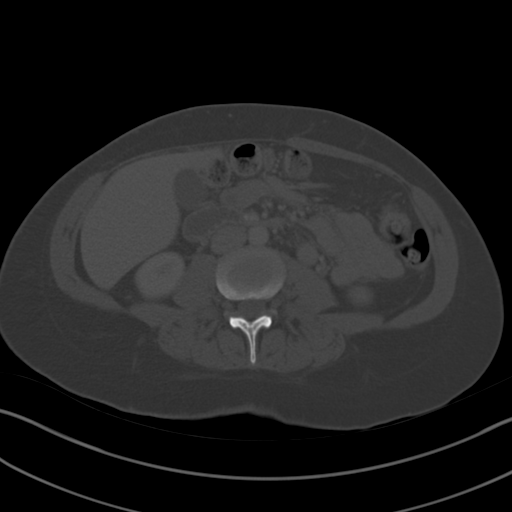
[im 60/88  soft-tissue]
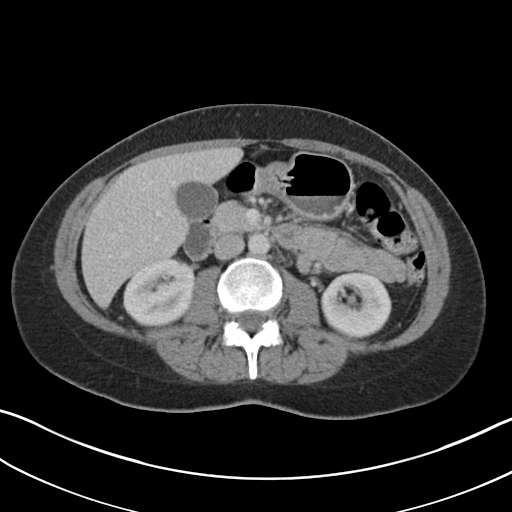
[im 71/88  soft-tissue]
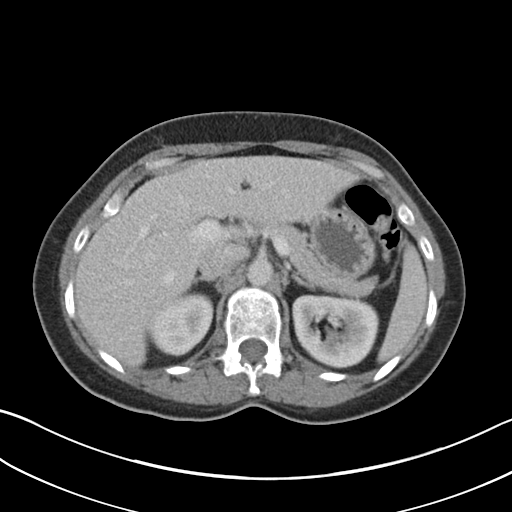
[im 77/88  soft-tissue]
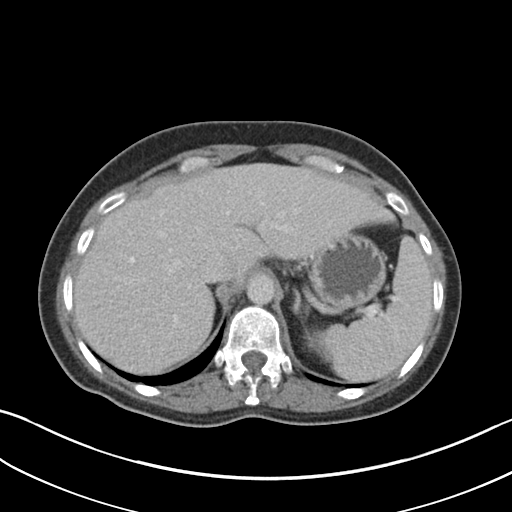
[im 82/88  soft-tissue]
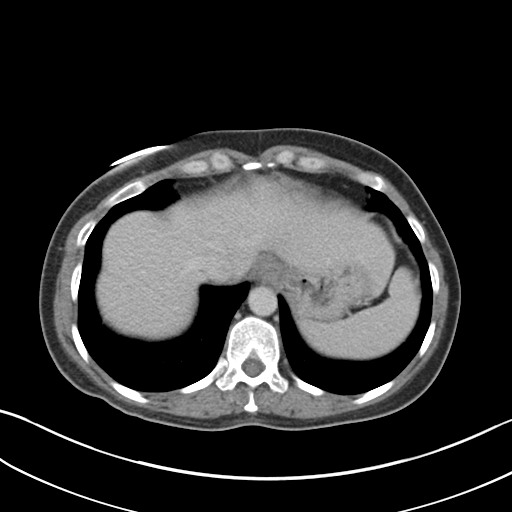

[Series 5: coronal st · coronal · 0.76mm/px · 3 of 127 slices shown]
[im 43/127  soft-tissue]
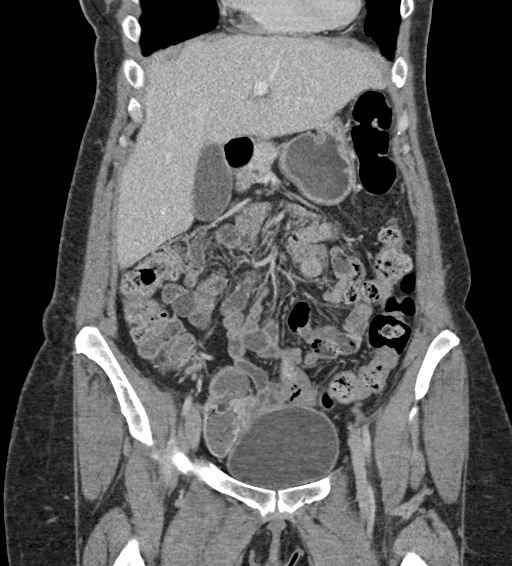
[im 57/127  soft-tissue]
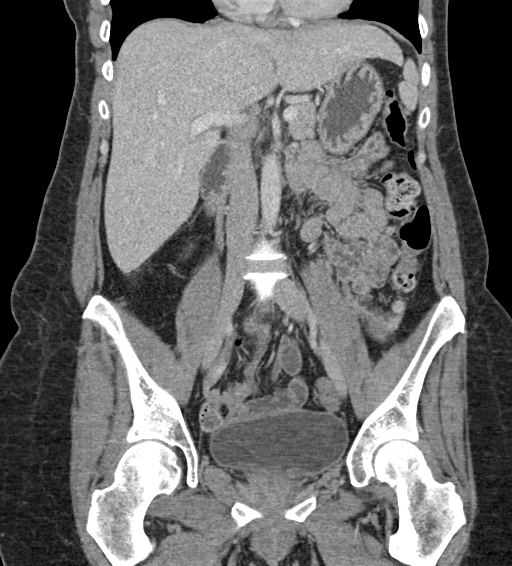
[im 71/127  soft-tissue]
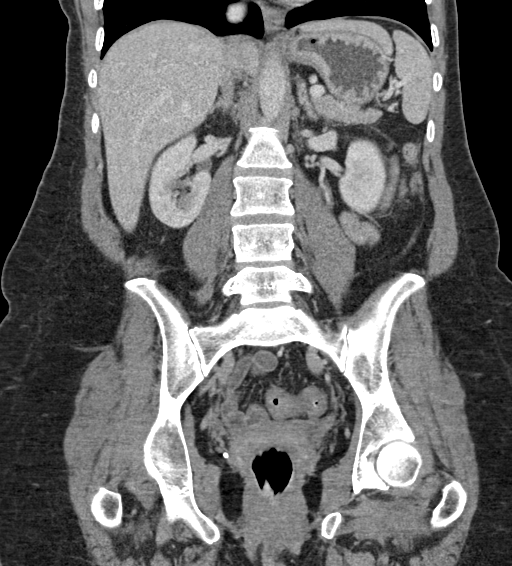

[16 of 46 positions shown; findings below may reference images not displayed]

FINDINGS: Lower chest: The visualized lung bases are clear.

No intra-abdominal free air or free fluid.

Hepatobiliary: No focal liver abnormality is seen. No gallstones,
gallbladder wall thickening, or biliary dilatation.

Pancreas: Unremarkable. No pancreatic ductal dilatation or
surrounding inflammatory changes.

Spleen: Unremarkable.

Adrenals/Urinary Tract: The adrenal glands are unremarkable. There
is no hydronephrosis on either side. There is symmetric enhancement
and excretion of contrast by both kidneys. Subcentimeter right renal
upper pole hypodense focus is too small to characterize. There is
duplication of the left renal collecting system and visualized
proximal ureter. The urinary bladder is unremarkable.

Stomach/Bowel: There is moderate stool throughout the colon. There
is no bowel obstruction or active inflammation. The appendix is
normal.

Vascular/Lymphatic: No significant vascular findings are present. No
enlarged abdominal or pelvic lymph nodes.

Reproductive: Hysterectomy. There is a 13 mm high attenuating focus
in the right ovary which is not characterized by this CT but may
represent a small partially calcified or proteinaceous corpus
albicans. This was present on the prior CT. Ultrasound may provide
better characterization if clinically indicated. The left ovary is
grossly unremarkable.

Other: Midline vertical anterior abdominal wall incisional scar.

Musculoskeletal: No acute or significant osseous findings.
IMPRESSION: No acute intra-abdominal or pelvic pathology. No bowel obstruction
or active inflammation. Normal appendix.

## 2019-12-21 DIAGNOSIS — M5432 Sciatica, left side: Secondary | ICD-10-CM | POA: Diagnosis not present

## 2019-12-21 DIAGNOSIS — M5387 Other specified dorsopathies, lumbosacral region: Secondary | ICD-10-CM | POA: Diagnosis not present

## 2019-12-21 DIAGNOSIS — M62838 Other muscle spasm: Secondary | ICD-10-CM | POA: Diagnosis not present

## 2019-12-26 DIAGNOSIS — M5387 Other specified dorsopathies, lumbosacral region: Secondary | ICD-10-CM | POA: Diagnosis not present

## 2019-12-26 DIAGNOSIS — M5432 Sciatica, left side: Secondary | ICD-10-CM | POA: Diagnosis not present

## 2019-12-26 DIAGNOSIS — M62838 Other muscle spasm: Secondary | ICD-10-CM | POA: Diagnosis not present

## 2019-12-28 DIAGNOSIS — M5387 Other specified dorsopathies, lumbosacral region: Secondary | ICD-10-CM | POA: Diagnosis not present

## 2019-12-28 DIAGNOSIS — M62838 Other muscle spasm: Secondary | ICD-10-CM | POA: Diagnosis not present

## 2019-12-28 DIAGNOSIS — M5432 Sciatica, left side: Secondary | ICD-10-CM | POA: Diagnosis not present

## 2019-12-30 ENCOUNTER — Emergency Department (HOSPITAL_COMMUNITY)
Admission: EM | Admit: 2019-12-30 | Discharge: 2019-12-30 | Disposition: A | Discharge status: Home / Self Care | Payer: Medicare Other | Attending: Emergency Medicine | Admitting: Emergency Medicine

## 2019-12-30 ENCOUNTER — Other Ambulatory Visit: Payer: Self-pay

## 2019-12-30 ENCOUNTER — Emergency Department (HOSPITAL_COMMUNITY): Payer: Medicare Other

## 2019-12-30 ENCOUNTER — Encounter (HOSPITAL_COMMUNITY): Payer: Self-pay | Admitting: Emergency Medicine

## 2019-12-30 DIAGNOSIS — R519 Headache, unspecified: Secondary | ICD-10-CM | POA: Insufficient documentation

## 2019-12-30 DIAGNOSIS — Y999 Unspecified external cause status: Secondary | ICD-10-CM | POA: Insufficient documentation

## 2019-12-30 DIAGNOSIS — Z79899 Other long term (current) drug therapy: Secondary | ICD-10-CM | POA: Insufficient documentation

## 2019-12-30 DIAGNOSIS — Y9389 Activity, other specified: Secondary | ICD-10-CM | POA: Insufficient documentation

## 2019-12-30 DIAGNOSIS — S0990XA Unspecified injury of head, initial encounter: Secondary | ICD-10-CM | POA: Diagnosis not present

## 2019-12-30 DIAGNOSIS — M7918 Myalgia, other site: Secondary | ICD-10-CM | POA: Diagnosis not present

## 2019-12-30 DIAGNOSIS — M25519 Pain in unspecified shoulder: Secondary | ICD-10-CM | POA: Diagnosis not present

## 2019-12-30 DIAGNOSIS — Y9289 Other specified places as the place of occurrence of the external cause: Secondary | ICD-10-CM | POA: Diagnosis not present

## 2019-12-30 DIAGNOSIS — M79644 Pain in right finger(s): Secondary | ICD-10-CM

## 2019-12-30 DIAGNOSIS — F17210 Nicotine dependence, cigarettes, uncomplicated: Secondary | ICD-10-CM | POA: Insufficient documentation

## 2019-12-30 DIAGNOSIS — S060X0A Concussion without loss of consciousness, initial encounter: Secondary | ICD-10-CM

## 2019-12-30 DIAGNOSIS — S6991XA Unspecified injury of right wrist, hand and finger(s), initial encounter: Secondary | ICD-10-CM | POA: Diagnosis not present

## 2019-12-30 DIAGNOSIS — M542 Cervicalgia: Secondary | ICD-10-CM | POA: Diagnosis not present

## 2019-12-30 DIAGNOSIS — G8929 Other chronic pain: Secondary | ICD-10-CM | POA: Diagnosis not present

## 2019-12-30 DIAGNOSIS — M546 Pain in thoracic spine: Secondary | ICD-10-CM | POA: Diagnosis not present

## 2019-12-30 DIAGNOSIS — M79641 Pain in right hand: Secondary | ICD-10-CM | POA: Diagnosis not present

## 2019-12-30 DIAGNOSIS — M797 Fibromyalgia: Secondary | ICD-10-CM | POA: Diagnosis not present

## 2019-12-30 MED ORDER — PROMETHAZINE HCL 25 MG/ML IJ SOLN
12.5000 mg | Freq: Once | INTRAMUSCULAR | Status: AC
  Administered 2019-12-30: 20:00:00 12.5 mg via INTRAVENOUS
  Filled 2019-12-30: qty 1

## 2019-12-30 MED ORDER — SODIUM CHLORIDE 0.9 % IV BOLUS
1000.0000 mL | Freq: Once | INTRAVENOUS | Status: AC
  Administered 2019-12-30: 21:00:00 1000 mL via INTRAVENOUS

## 2019-12-30 MED ORDER — HYDROMORPHONE HCL 1 MG/ML IJ SOLN
1.0000 mg | Freq: Once | INTRAMUSCULAR | Status: AC
  Administered 2019-12-30: 20:00:00 1 mg via INTRAVENOUS
  Filled 2019-12-30: qty 1

## 2019-12-30 MED ORDER — DIPHENHYDRAMINE HCL 50 MG/ML IJ SOLN
12.5000 mg | Freq: Once | INTRAMUSCULAR | Status: AC
  Administered 2019-12-30: 12.5 mg via INTRAVENOUS
  Filled 2019-12-30: qty 1
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
## 2019-12-30 NOTE — ED Notes (Signed)
Pt requesting to leave prior to completion of fluids. Discontinuing fluids and discharging.

## 2019-12-30 NOTE — Discharge Instructions (Addendum)
As discussed, your CT head and x-rays were normal. You most likely have a concussion. Limit the amount of screen time and have adequate brain rest. I have included information on concussions. I have also placed a referral to the neurologist for further evaluation. You should hear from them within the next week, if you do not, call to schedule an appointment. I have placed your right hand in a splint given the location of your pain. Please call the orthopedics tomorrow to schedule an appointment for further evaluation. You may take over the counter ibuprofen or tylenol as needed for pain. Return to the ER for new or worsening symptoms.
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
## 2020-01-03 DIAGNOSIS — M797 Fibromyalgia: Secondary | ICD-10-CM | POA: Diagnosis not present

## 2020-01-03 DIAGNOSIS — G8929 Other chronic pain: Secondary | ICD-10-CM | POA: Diagnosis not present

## 2020-01-03 DIAGNOSIS — M546 Pain in thoracic spine: Secondary | ICD-10-CM | POA: Diagnosis not present

## 2020-01-03 DIAGNOSIS — M7918 Myalgia, other site: Secondary | ICD-10-CM | POA: Diagnosis not present

## 2020-01-03 DIAGNOSIS — M25519 Pain in unspecified shoulder: Secondary | ICD-10-CM | POA: Diagnosis not present

## 2020-01-04 DIAGNOSIS — M5442 Lumbago with sciatica, left side: Secondary | ICD-10-CM | POA: Diagnosis not present

## 2020-01-04 DIAGNOSIS — G8929 Other chronic pain: Secondary | ICD-10-CM | POA: Diagnosis not present

## 2020-01-08 DIAGNOSIS — M5432 Sciatica, left side: Secondary | ICD-10-CM | POA: Diagnosis not present

## 2020-01-08 DIAGNOSIS — M62838 Other muscle spasm: Secondary | ICD-10-CM | POA: Diagnosis not present

## 2020-01-08 DIAGNOSIS — M5387 Other specified dorsopathies, lumbosacral region: Secondary | ICD-10-CM | POA: Diagnosis not present

## 2020-01-15 DIAGNOSIS — M5432 Sciatica, left side: Secondary | ICD-10-CM | POA: Diagnosis not present

## 2020-01-15 DIAGNOSIS — M5387 Other specified dorsopathies, lumbosacral region: Secondary | ICD-10-CM | POA: Diagnosis not present

## 2020-01-15 DIAGNOSIS — M62838 Other muscle spasm: Secondary | ICD-10-CM | POA: Diagnosis not present

## 2020-01-24 DIAGNOSIS — M62838 Other muscle spasm: Secondary | ICD-10-CM | POA: Diagnosis not present

## 2020-01-24 DIAGNOSIS — M5387 Other specified dorsopathies, lumbosacral region: Secondary | ICD-10-CM | POA: Diagnosis not present

## 2020-01-24 DIAGNOSIS — M5432 Sciatica, left side: Secondary | ICD-10-CM | POA: Diagnosis not present

## 2020-01-27 DIAGNOSIS — M797 Fibromyalgia: Secondary | ICD-10-CM | POA: Diagnosis not present

## 2020-01-27 DIAGNOSIS — G8929 Other chronic pain: Secondary | ICD-10-CM | POA: Diagnosis not present

## 2020-01-27 DIAGNOSIS — M25519 Pain in unspecified shoulder: Secondary | ICD-10-CM | POA: Diagnosis not present

## 2020-01-27 DIAGNOSIS — M7918 Myalgia, other site: Secondary | ICD-10-CM | POA: Diagnosis not present

## 2020-01-27 DIAGNOSIS — M546 Pain in thoracic spine: Secondary | ICD-10-CM | POA: Diagnosis not present

## 2020-02-03 DIAGNOSIS — M797 Fibromyalgia: Secondary | ICD-10-CM | POA: Diagnosis not present

## 2020-02-03 DIAGNOSIS — M7918 Myalgia, other site: Secondary | ICD-10-CM | POA: Diagnosis not present

## 2020-02-03 DIAGNOSIS — M546 Pain in thoracic spine: Secondary | ICD-10-CM | POA: Diagnosis not present

## 2020-02-03 DIAGNOSIS — G8929 Other chronic pain: Secondary | ICD-10-CM | POA: Diagnosis not present

## 2020-02-03 DIAGNOSIS — M25519 Pain in unspecified shoulder: Secondary | ICD-10-CM | POA: Diagnosis not present

## 2020-02-06 DIAGNOSIS — M797 Fibromyalgia: Secondary | ICD-10-CM | POA: Diagnosis not present

## 2020-02-06 DIAGNOSIS — G8929 Other chronic pain: Secondary | ICD-10-CM | POA: Diagnosis not present

## 2020-02-06 DIAGNOSIS — M5442 Lumbago with sciatica, left side: Secondary | ICD-10-CM | POA: Diagnosis not present

## 2020-02-27 DIAGNOSIS — G8929 Other chronic pain: Secondary | ICD-10-CM | POA: Diagnosis not present

## 2020-02-27 DIAGNOSIS — M7918 Myalgia, other site: Secondary | ICD-10-CM | POA: Diagnosis not present

## 2020-02-27 DIAGNOSIS — M797 Fibromyalgia: Secondary | ICD-10-CM | POA: Diagnosis not present

## 2020-02-27 DIAGNOSIS — M25519 Pain in unspecified shoulder: Secondary | ICD-10-CM | POA: Diagnosis not present

## 2020-02-27 DIAGNOSIS — M546 Pain in thoracic spine: Secondary | ICD-10-CM | POA: Diagnosis not present

## 2020-03-04 DIAGNOSIS — M797 Fibromyalgia: Secondary | ICD-10-CM | POA: Diagnosis not present

## 2020-03-04 DIAGNOSIS — M25519 Pain in unspecified shoulder: Secondary | ICD-10-CM | POA: Diagnosis not present

## 2020-03-04 DIAGNOSIS — M546 Pain in thoracic spine: Secondary | ICD-10-CM | POA: Diagnosis not present

## 2020-03-04 DIAGNOSIS — G8929 Other chronic pain: Secondary | ICD-10-CM | POA: Diagnosis not present

## 2020-03-04 DIAGNOSIS — M7918 Myalgia, other site: Secondary | ICD-10-CM | POA: Diagnosis not present

## 2020-03-06 DIAGNOSIS — E559 Vitamin D deficiency, unspecified: Secondary | ICD-10-CM | POA: Diagnosis not present

## 2020-03-06 DIAGNOSIS — M5442 Lumbago with sciatica, left side: Secondary | ICD-10-CM | POA: Diagnosis not present

## 2020-03-06 DIAGNOSIS — E7800 Pure hypercholesterolemia, unspecified: Secondary | ICD-10-CM | POA: Diagnosis not present

## 2020-03-06 DIAGNOSIS — Z79899 Other long term (current) drug therapy: Secondary | ICD-10-CM | POA: Diagnosis not present

## 2020-03-06 DIAGNOSIS — Z20822 Contact with and (suspected) exposure to covid-19: Secondary | ICD-10-CM | POA: Diagnosis not present

## 2020-03-06 DIAGNOSIS — G8929 Other chronic pain: Secondary | ICD-10-CM | POA: Diagnosis not present

## 2020-03-28 DIAGNOSIS — M7918 Myalgia, other site: Secondary | ICD-10-CM | POA: Diagnosis not present

## 2020-03-28 DIAGNOSIS — M797 Fibromyalgia: Secondary | ICD-10-CM | POA: Diagnosis not present

## 2020-03-28 DIAGNOSIS — G8929 Other chronic pain: Secondary | ICD-10-CM | POA: Diagnosis not present

## 2020-03-28 DIAGNOSIS — M546 Pain in thoracic spine: Secondary | ICD-10-CM | POA: Diagnosis not present

## 2020-03-28 DIAGNOSIS — M25519 Pain in unspecified shoulder: Secondary | ICD-10-CM | POA: Diagnosis not present

## 2020-04-01 DIAGNOSIS — Z1231 Encounter for screening mammogram for malignant neoplasm of breast: Secondary | ICD-10-CM | POA: Diagnosis not present

## 2020-04-02 DIAGNOSIS — G8929 Other chronic pain: Secondary | ICD-10-CM | POA: Diagnosis not present

## 2020-04-02 DIAGNOSIS — K5903 Drug induced constipation: Secondary | ICD-10-CM | POA: Diagnosis not present

## 2020-04-02 DIAGNOSIS — T402X5A Adverse effect of other opioids, initial encounter: Secondary | ICD-10-CM | POA: Diagnosis not present

## 2020-04-02 DIAGNOSIS — M5442 Lumbago with sciatica, left side: Secondary | ICD-10-CM | POA: Diagnosis not present

## 2020-04-04 DIAGNOSIS — M546 Pain in thoracic spine: Secondary | ICD-10-CM | POA: Diagnosis not present

## 2020-04-04 DIAGNOSIS — G8929 Other chronic pain: Secondary | ICD-10-CM | POA: Diagnosis not present

## 2020-04-04 DIAGNOSIS — M797 Fibromyalgia: Secondary | ICD-10-CM | POA: Diagnosis not present

## 2020-04-04 DIAGNOSIS — M7918 Myalgia, other site: Secondary | ICD-10-CM | POA: Diagnosis not present

## 2020-04-04 DIAGNOSIS — M25519 Pain in unspecified shoulder: Secondary | ICD-10-CM | POA: Diagnosis not present

## 2020-04-16 DIAGNOSIS — M5432 Sciatica, left side: Secondary | ICD-10-CM | POA: Diagnosis not present

## 2020-04-16 DIAGNOSIS — M5387 Other specified dorsopathies, lumbosacral region: Secondary | ICD-10-CM | POA: Diagnosis not present

## 2020-04-16 DIAGNOSIS — M62838 Other muscle spasm: Secondary | ICD-10-CM | POA: Diagnosis not present

## 2020-05-02 DIAGNOSIS — Z79899 Other long term (current) drug therapy: Secondary | ICD-10-CM | POA: Diagnosis not present

## 2020-05-02 DIAGNOSIS — K5903 Drug induced constipation: Secondary | ICD-10-CM | POA: Diagnosis not present

## 2020-05-02 DIAGNOSIS — G8929 Other chronic pain: Secondary | ICD-10-CM | POA: Diagnosis not present

## 2020-05-02 DIAGNOSIS — M5442 Lumbago with sciatica, left side: Secondary | ICD-10-CM | POA: Diagnosis not present

## 2020-05-02 DIAGNOSIS — T402X5A Adverse effect of other opioids, initial encounter: Secondary | ICD-10-CM | POA: Diagnosis not present

## 2020-05-15 ENCOUNTER — Telehealth (INDEPENDENT_AMBULATORY_CARE_PROVIDER_SITE_OTHER): Payer: Medicaid Other | Admitting: Family Medicine

## 2020-05-15 ENCOUNTER — Encounter: Payer: Self-pay | Admitting: Family Medicine

## 2020-05-15 ENCOUNTER — Other Ambulatory Visit: Payer: Self-pay

## 2020-05-15 DIAGNOSIS — J069 Acute upper respiratory infection, unspecified: Secondary | ICD-10-CM

## 2020-05-15 MED ORDER — PROMETHAZINE-DM 6.25-15 MG/5ML PO SYRP: 5.0000 mL | ORAL_SOLUTION | Freq: Four times a day (QID) | ORAL | 0 refills | Status: DC | PRN

## 2020-05-15 NOTE — Progress Notes (Signed)
Virtual Visit Note  I connected with patient on 05/15/20 at 459pm by video doximity and verified that I am speaking with the correct person using two identifiers. Gina [name] is currently located at home and patient is currently with them during visit. The provider, Rutherford Guys, MD is located in their office at time of visit.  I discussed the limitations, risks, security and privacy concerns of performing an evaluation and management service by telephone and the availability of in person appointments. I also discussed with the patient that there may be a patient responsible charge related to this service. The patient expressed understanding and agreed to proceed.   I provided 13 minutes of non-face-to-face time during this encounter.  CC: cough and congestion  HPI ? 4 days of cough, runny nose, froggy voice, watery eyes Has tried mucinex and sudafed No fever, chills, no sore throat Sneezing, itchy No body aches, no headaches, no ear pain No allergies No covid contacts Her husband also getting sick, her daughter also got sick  She had covid vaccines in April Sob only lying down due to nasal congestion  Allergies  Allergen Reactions  . Lamictal [Lamotrigine] Anaphylaxis and Other (See Comments)    STEVEN JOHNSON'S SYNDROME  . Morphine And Related Other (See Comments)    Makes pt "very mean" "I get mean" Makes pt "very mean" Makes pt "very mean"  . Nicotine Swelling and Palpitations    Other reaction(s): Respiratory Distress (ALLERGY/intolerance), Swelling (ALLERGY/intolerance), Tachycardia / Palpitations  (intolerance) Patches, lozenges Patches caused palpations lozenges throat swelled  . Zofran Anaphylaxis  . Chantix [Varenicline Tartrate] Other (See Comments)     Diaphoresis, sweating.  Night terrors.  "went crazy"  . Codeine Nausea And Vomiting  . Haldol [Haloperidol] Itching, Rash and Other (See Comments)    FLUSHING  . Ibuprofen Nausea And Vomiting  .  Lidoderm [Lidocaine] Other (See Comments)    DOESN'T WORK FOR PATIENT  . Metaxalone Other (See Comments)    Pt does not remember reaction, REAL BAD REACTION  . Sulfa Antibiotics Diarrhea and Other (See Comments)    GI PAINS ALSO  . Sulfasalazine Diarrhea    GI PAINS  . Toradol [Ketorolac Tromethamine] Nausea And Vomiting  . Duloxetine Other (See Comments)    unk  . Gabapentin Nausea And Vomiting  . Ingrezza [Valbenazine Tosylate] Hives  . Methadone     seizures  . Pregabalin     unk  . Zolpidem     Mental status changes  . Amoxicillin Rash    Has patient had a PCN reaction causing immediate rash, facial/tongue/throat swelling, SOB or lightheadedness with hypotension: No Has patient had a PCN reaction causing severe rash involving mucus membranes or skin necrosis: No Has patient had a PCN reaction that required hospitalization: No Has patient had a PCN reaction occurring within the last 10 years: No If all of the above answers are "NO", then may proceed with Cephalosporin use.   . Aripiprazole Anxiety    hallucinations  . Penicillins Rash    Other reaction(s): Urticaria / Hives (ALLERGY) Has patient had a PCN reaction causing immediate rash, facial/tongue/throat swelling, SOB or lightheadedness with hypotension: No Has patient had a PCN reaction causing severe rash involving mucus membranes or skin necrosis: No Has patient had a PCN reaction that required hospitalization: No Has patient had a PCN reaction occurring within the last 10 years: No If all of the above answers are "NO", then may proceed with Cephalosporin use.  Marland Kitchen  Valbenazine Rash    Prior to Admission medications   Medication Sig Start Date End Date Taking? Authorizing Provider  clonazePAM (KLONOPIN) 0.5 MG tablet Take 0.5 mg by mouth 2 (two) times daily.  08/30/18  Yes [provider]  cyclobenzaprine (FLEXERIL) 10 MG tablet Take 10 mg by mouth 3 (three) times daily as needed for muscle spasms.  01/19/19   Yes [provider]  escitalopram (LEXAPRO) 20 MG tablet Take 30 mg by mouth at bedtime. 12/20/18  Yes [provider]  lurasidone (LATUDA) 80 MG TABS tablet Take 80 mg by mouth at bedtime.    Yes [provider]  naloxegol oxalate (MOVANTIK) 25 MG TABS tablet Take 25 mg by mouth daily as needed (constipation).    Yes [provider]  oxyCODONE-acetaminophen (PERCOCET) 7.5-325 MG tablet Take 1 tablet by mouth 4 (four) times daily.   Yes [provider]  REXULTI 1 MG TABS Take 1 tablet by mouth daily.  01/19/19  Yes [provider]    Past Medical History:  Diagnosis Date  . ABDOMINAL WALL HERNIA 02/27/2010  . Abnormal Pap smear   . ALLERGIC RHINITIS 06/02/2007  . ANOREXIA, CHRONIC 08/07/2008  . ANXIETY 09/15/2010  . Arthritis   . BACK PAIN, THORACIC REGION 07/07/2007  . Bipolar disorder ([place])   . CERVICAL RADICULOPATHY 12/11/2008  . Condyloma acuminatum 04/23/2009  . CONSTIPATION 09/15/2010  . DYSPHAGIA UNSPECIFIED 09/09/2009  . 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 Eating disorder   . ENDOMETRIOSIS 12/16/2009  . FATIGUE 06/11/2010  . Fibromyalgia   . GERD 10/23/2009  . Heart palpitations   . HERPES SIMPLEX INFECTION 06/11/2010  . LENTIGO 04/23/2009  . LUNG NODULE 06/17/2010  . Narcotic abuse, continuous ([place])   . Ovarian cyst   . Palpitations 10/23/2009  . Panic disorder   . Renal disorder   . Rheumatoid arthritis(714.0)   . Stricture and stenosis of esophagus 05/13/2010  . TOBACCO ABUSE 08/07/2008  . Urinary tract infection   . UTI (urinary tract infection)     Past Surgical History:  Procedure Laterality Date  . ABDOMINAL SURGERY    . ABDOMINAL WALL MESH  REMOVAL    . ABLATION ON ENDOMETRIOSIS    . CESAREAN SECTION     x2   . DILATION AND CURETTAGE OF UTERUS     x1   . ENDOMETRIAL ABLATION    . esophageal dilatation x 4    . GUM SURGERY    . HERNIA REPAIR     X3  . PARTIAL HYSTERECTOMY      Social History   Tobacco Use    . Smoking status: Current Every Day Smoker    Packs/day: 1.00    Types: Cigarettes  . Smokeless tobacco: Never Used  Substance Use Topics  . Alcohol use: No    Family History  Problem Relation Age of Onset  . Depression Mother   . Arthritis Mother   . Hyperlipidemia Father   . Hypertension Father     ROS Per hpi  Objective  Vitals as reported by the patient: none  GEN: AAOx3, NAD HEENT: [place]/AT, pupils are symmetrical, EOMI, non-icteric sclera Resp: breathing comfortably, speaking in full sentences Skin: no rashes noted, no pallor Psych: good eye contact, normal mood and affect   ASSESSMENT and PLAN  1. URI, acute Discussed supportive measures, new meds r/se/b and RTC precautions.   Other orders - promethazine-dextromethorphan (PROMETHAZINE-DM) 6.25-15 MG/5ML syrup; Take 5 mLs by mouth 4 (four) times daily as needed  for cough.  FOLLOW-UP: prn   The above assessment and management plan was discussed with the patient. The patient verbalized understanding of and has agreed to the management plan. Patient is aware to call the clinic if symptoms persist or worsen. Patient is aware when to return to the clinic for a follow-up visit. Patient educated on when it is appropriate to go to the emergency department.     Rutherford Guys, MD [facility],  [address] Ph.  [phone] Fax [phone]

## 2020-05-29 DIAGNOSIS — G8929 Other chronic pain: Secondary | ICD-10-CM | POA: Diagnosis not present

## 2020-05-29 DIAGNOSIS — Z79899 Other long term (current) drug therapy: Secondary | ICD-10-CM | POA: Diagnosis not present

## 2020-05-29 DIAGNOSIS — M5442 Lumbago with sciatica, left side: Secondary | ICD-10-CM | POA: Diagnosis not present

## 2020-07-01 DIAGNOSIS — Z9189 Other specified personal risk factors, not elsewhere classified: Secondary | ICD-10-CM | POA: Diagnosis not present

## 2020-07-01 DIAGNOSIS — G8929 Other chronic pain: Secondary | ICD-10-CM | POA: Diagnosis not present

## 2020-07-01 DIAGNOSIS — M5442 Lumbago with sciatica, left side: Secondary | ICD-10-CM | POA: Diagnosis not present

## 2020-07-31 DIAGNOSIS — E7800 Pure hypercholesterolemia, unspecified: Secondary | ICD-10-CM | POA: Diagnosis not present

## 2020-07-31 DIAGNOSIS — M5442 Lumbago with sciatica, left side: Secondary | ICD-10-CM | POA: Diagnosis not present

## 2020-07-31 DIAGNOSIS — E559 Vitamin D deficiency, unspecified: Secondary | ICD-10-CM | POA: Diagnosis not present

## 2020-07-31 DIAGNOSIS — Z131 Encounter for screening for diabetes mellitus: Secondary | ICD-10-CM | POA: Diagnosis not present

## 2020-07-31 DIAGNOSIS — R5383 Other fatigue: Secondary | ICD-10-CM | POA: Diagnosis not present

## 2020-07-31 DIAGNOSIS — M129 Arthropathy, unspecified: Secondary | ICD-10-CM | POA: Diagnosis not present

## 2020-07-31 DIAGNOSIS — Z0000 Encounter for general adult medical examination without abnormal findings: Secondary | ICD-10-CM | POA: Diagnosis not present

## 2020-07-31 DIAGNOSIS — G8929 Other chronic pain: Secondary | ICD-10-CM | POA: Diagnosis not present

## 2020-07-31 DIAGNOSIS — Z79899 Other long term (current) drug therapy: Secondary | ICD-10-CM | POA: Diagnosis not present

## 2020-07-31 DIAGNOSIS — Z1159 Encounter for screening for other viral diseases: Secondary | ICD-10-CM | POA: Diagnosis not present

## 2020-08-25 ENCOUNTER — Other Ambulatory Visit: Payer: Self-pay

## 2020-08-25 ENCOUNTER — Telehealth: Payer: Medicaid Other | Admitting: Family Medicine

## 2020-08-25 DIAGNOSIS — Z20822 Contact with and (suspected) exposure to covid-19: Secondary | ICD-10-CM | POA: Diagnosis not present

## 2020-08-25 DIAGNOSIS — H9202 Otalgia, left ear: Secondary | ICD-10-CM | POA: Diagnosis not present

## 2020-08-25 DIAGNOSIS — L089 Local infection of the skin and subcutaneous tissue, unspecified: Secondary | ICD-10-CM | POA: Diagnosis not present

## 2020-08-29 DIAGNOSIS — F17210 Nicotine dependence, cigarettes, uncomplicated: Secondary | ICD-10-CM | POA: Diagnosis not present

## 2020-08-29 DIAGNOSIS — M5442 Lumbago with sciatica, left side: Secondary | ICD-10-CM | POA: Diagnosis not present

## 2020-08-29 DIAGNOSIS — Z9189 Other specified personal risk factors, not elsewhere classified: Secondary | ICD-10-CM | POA: Diagnosis not present

## 2020-08-29 DIAGNOSIS — G8929 Other chronic pain: Secondary | ICD-10-CM | POA: Diagnosis not present

## 2021-01-06 ENCOUNTER — Other Ambulatory Visit: Payer: Self-pay

## 2021-01-06 ENCOUNTER — Emergency Department (HOSPITAL_BASED_OUTPATIENT_CLINIC_OR_DEPARTMENT_OTHER)
Admission: EM | Admit: 2021-01-06 | Discharge: 2021-01-06 | Disposition: A | Discharge status: Home / Self Care | Payer: Medicare Other | Attending: Emergency Medicine | Admitting: Emergency Medicine

## 2021-01-06 ENCOUNTER — Other Ambulatory Visit (HOSPITAL_BASED_OUTPATIENT_CLINIC_OR_DEPARTMENT_OTHER): Payer: Self-pay

## 2021-01-06 ENCOUNTER — Emergency Department (HOSPITAL_BASED_OUTPATIENT_CLINIC_OR_DEPARTMENT_OTHER): Payer: Medicare Other

## 2021-01-06 ENCOUNTER — Encounter (HOSPITAL_BASED_OUTPATIENT_CLINIC_OR_DEPARTMENT_OTHER): Payer: Self-pay | Admitting: Emergency Medicine

## 2021-01-06 DIAGNOSIS — R112 Nausea with vomiting, unspecified: Secondary | ICD-10-CM

## 2021-01-06 DIAGNOSIS — M797 Fibromyalgia: Secondary | ICD-10-CM | POA: Diagnosis not present

## 2021-01-06 DIAGNOSIS — R1030 Lower abdominal pain, unspecified: Secondary | ICD-10-CM | POA: Diagnosis present

## 2021-01-06 DIAGNOSIS — F17210 Nicotine dependence, cigarettes, uncomplicated: Secondary | ICD-10-CM | POA: Insufficient documentation

## 2021-01-06 DIAGNOSIS — M545 Low back pain, unspecified: Secondary | ICD-10-CM | POA: Diagnosis not present

## 2021-01-06 DIAGNOSIS — Z79899 Other long term (current) drug therapy: Secondary | ICD-10-CM | POA: Diagnosis not present

## 2021-01-06 LAB — CBC WITH DIFFERENTIAL/PLATELET
Abs Immature Granulocytes: 0.01 10*3/uL (ref 0.00–0.07)
Basophils Absolute: 0 10*3/uL (ref 0.0–0.1)
Basophils Relative: 1 %
Eosinophils Absolute: 0.2 10*3/uL (ref 0.0–0.5)
Eosinophils Relative: 3 %
HCT: 42.6 % (ref 36.0–46.0)
Hemoglobin: 14.3 g/dL (ref 12.0–15.0)
Immature Granulocytes: 0 %
Lymphocytes Relative: 32 %
Lymphs Abs: 2 10*3/uL (ref 0.7–4.0)
MCH: 32.4 pg (ref 26.0–34.0)
MCHC: 33.6 g/dL (ref 30.0–36.0)
MCV: 96.4 fL (ref 80.0–100.0)
Monocytes Absolute: 0.5 10*3/uL (ref 0.1–1.0)
Monocytes Relative: 8 %
Neutro Abs: 3.4 10*3/uL (ref 1.7–7.7)
Neutrophils Relative %: 56 %
Platelets: 218 10*3/uL (ref 150–400)
RBC: 4.42 MIL/uL (ref 3.87–5.11)
RDW: 12.1 % (ref 11.5–15.5)
WBC: 6.1 10*3/uL (ref 4.0–10.5)
nRBC: 0 % (ref 0.0–0.2)

## 2021-01-06 LAB — COMPREHENSIVE METABOLIC PANEL
ALT: 10 U/L (ref 0–44)
AST: 12 U/L — ABNORMAL LOW (ref 15–41)
Albumin: 4.2 g/dL (ref 3.5–5.0)
Alkaline Phosphatase: 53 U/L (ref 38–126)
Anion gap: 6 (ref 5–15)
BUN: 11 mg/dL (ref 6–20)
CO2: 29 mmol/L (ref 22–32)
Calcium: 8.8 mg/dL — ABNORMAL LOW (ref 8.9–10.3)
Chloride: 105 mmol/L (ref 98–111)
Creatinine, Ser: 0.63 mg/dL (ref 0.44–1.00)
GFR, Estimated: >60 mL/min (ref >60)
Glucose, Bld: 74 mg/dL (ref 70–99)
Potassium: 3.9 mmol/L (ref 3.5–5.1)
Sodium: 140 mmol/L (ref 135–145)
Total Bilirubin: 0.7 mg/dL (ref 0.3–1.2)
Total Protein: 6.4 g/dL — ABNORMAL LOW (ref 6.5–8.1)

## 2021-01-06 LAB — URINALYSIS, ROUTINE W REFLEX MICROSCOPIC
Bilirubin Urine: NEGATIVE
Glucose, UA: NEGATIVE mg/dL
Hgb urine dipstick: NEGATIVE
Ketones, ur: NEGATIVE mg/dL
Leukocytes,Ua: NEGATIVE
Nitrite: NEGATIVE
Protein, ur: NEGATIVE mg/dL
Specific Gravity, Urine: 1.017 (ref 1.005–1.030)
pH: 7 (ref 5.0–8.0)

## 2021-01-06 MED ORDER — METHOCARBAMOL 1000 MG/10ML IJ SOLN
1000.0000 mg | Freq: Once | INTRAVENOUS | Status: AC
  Administered 2021-01-06: 1000 mg via INTRAVENOUS
  Filled 2021-01-06: qty 10

## 2021-01-06 MED ORDER — OXYCODONE HCL 5 MG PO TABS
5.0000 mg | ORAL_TABLET | Freq: Once | ORAL | Status: AC
  Administered 2021-01-06: 5 mg via ORAL
  Filled 2021-01-06: qty 1

## 2021-01-06 MED ORDER — PROMETHAZINE HCL 25 MG PO TABS: 25.0000 mg | ORAL_TABLET | Freq: Four times a day (QID) | ORAL | 0 refills | Status: DC | PRN

## 2021-01-06 MED ORDER — METOCLOPRAMIDE HCL 5 MG/ML IJ SOLN
10.0000 mg | Freq: Once | INTRAMUSCULAR | Status: AC
  Administered 2021-01-06: 10 mg via INTRAVENOUS
  Filled 2021-01-06: qty 2

## 2021-01-06 MED ORDER — METHOCARBAMOL 500 MG PO TABS: 500.0000 mg | ORAL_TABLET | Freq: Two times a day (BID) | ORAL | 0 refills | Status: DC

## 2021-01-06 MED ORDER — SODIUM CHLORIDE 0.9 % IV BOLUS
1000.0000 mL | Freq: Once | INTRAVENOUS | Status: AC
  Administered 2021-01-06: 1000 mL via INTRAVENOUS

## 2021-01-06 MED ORDER — METHOCARBAMOL 1000 MG/10ML IJ SOLN: 1000.0000 mg | Freq: Once | INTRAMUSCULAR | Status: DC

## 2021-01-06 MED ORDER — METHOCARBAMOL 1000 MG/10ML IJ SOLN
INTRAMUSCULAR | Status: AC
  Filled 2021-01-06: qty 10

## 2021-01-06 MED ORDER — PROMETHAZINE HCL 25 MG/ML IJ SOLN
25.0000 mg | Freq: Once | INTRAMUSCULAR | Status: AC
  Administered 2021-01-06: 25 mg via INTRAVENOUS
  Filled 2021-01-06: qty 1

## 2021-01-06 MED ORDER — HYDROMORPHONE HCL 1 MG/ML IJ SOLN
1.0000 mg | Freq: Once | INTRAMUSCULAR | Status: AC
  Administered 2021-01-06: 1 mg via INTRAVENOUS
  Filled 2021-01-06: qty 1

## 2021-01-06 MED ORDER — LORAZEPAM 2 MG/ML IJ SOLN
0.5000 mg | Freq: Once | INTRAMUSCULAR | Status: AC
  Administered 2021-01-06: 0.5 mg via INTRAVENOUS
  Filled 2021-01-06: qty 1

## 2021-01-06 NOTE — ED Notes (Signed)
Back from CT

## 2021-01-06 NOTE — ED Notes (Signed)
ED Provider at bedside. 

## 2021-01-06 NOTE — ED Triage Notes (Signed)
Pt via pov from home with lower back pain bilaterally. Pt states she had some urinary frequency a few days ago and now she is "hardly urinating at all." Pt states she went to [facility] yesterday and had a urinalysis. She states it was cloudy and had a smell, but the urinalysis did not show a UTI. They recommended her to come to ED because they anticipate that her kidneys may be infected. Pt alert & oriented, nad noted.

## 2021-01-06 NOTE — ED Provider Notes (Signed)
[facility] Provider Note   CSN: [id number] Arrival date & time: 01/06/21  1139     History Chief Complaint  Patient presents with  . Back Pain  . urinary symptoms    Gina [name] is a 45 y.o. female.  HPI      45 year old female with a history of rheumatoid arthritis, constipation, fibromyalgia, bipolar disorder, presents with concern for bilateral lower back pain.  Reports 2 weeks ago she was having cloudy urine and some pressure with urination, followed by frequency of urination, followed by decreased urination over the last few days.  Reports that if she began having flank pain, worse on the right side but notes some pain on the left side as well.  Reports the pain is constantly there is an ache, with very sharp episodes.  She had some vaginal discharge initially, but denies now.  She has had a history of hysterectomy.  She reports chronic constipation without a change.  Denies nausea, vomiting, fevers, cough, chest pain, shortness of breath.  Reports the pain is severe.  She was seen in urgent care last night, and had a urinalysis that did not show anything significant, and was told to go to the emergency department for further care.  Reports that she took a pain medication last night and used a heating pad and had no relief.  No history of nephrolithiasis.  Denies possibility of withdrawal from opiates, has been taking home doses.  Past Medical History:  Diagnosis Date  . ABDOMINAL WALL HERNIA 02/27/2010  . Abnormal Pap smear   . ALLERGIC RHINITIS 06/02/2007  . ANOREXIA, CHRONIC 08/07/2008  . ANXIETY 09/15/2010  . Arthritis   . BACK PAIN, THORACIC REGION 07/07/2007  . Bipolar disorder ([place])   . CERVICAL RADICULOPATHY 12/11/2008  . Condyloma acuminatum 04/23/2009  . CONSTIPATION 09/15/2010  . DYSPHAGIA UNSPECIFIED 09/09/2009  . 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 Eating disorder   . ENDOMETRIOSIS 12/16/2009  . FATIGUE 06/11/2010  . Fibromyalgia   .  GERD 10/23/2009  . Heart palpitations   . HERPES SIMPLEX INFECTION 06/11/2010  . LENTIGO 04/23/2009  . LUNG NODULE 06/17/2010  . Narcotic abuse, continuous ([place])   . Ovarian cyst   . Palpitations 10/23/2009  . Panic disorder   . Renal disorder   . Rheumatoid arthritis(714.0)   . Stricture and stenosis of esophagus 05/13/2010  . TOBACCO ABUSE 08/07/2008  . Urinary tract infection   . UTI (urinary tract infection)     Patient Active Problem List   Diagnosis Date Noted  . Piriformis syndrome of both sides 05/31/2019  . History of bipolar disorder 04/18/2019  . Tardive dyskinesia 04/18/2019  . Urticaria due to drug allergy 04/18/2019  . Lumbar spondylosis 02/05/2019  . Bipolar affective disorder, current episode mixed ([place]) 01/02/2019  . Arthritis 01/02/2019  . Cigarette nicotine dependence without complication 52/77/8242  . Bilateral temporomandibular joint pain 10/02/2018  . Referred otalgia, bilateral 10/02/2018  . Paresthesias 03/09/2018  . Common migraine 04/22/2016  . Bipolar 1 disorder, mixed, moderate ([facility]) 07/15/2015    Class: Chronic  . 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 Sciatica 01/09/2015  . Reactive hypoglycemia 12/17/2013  . Musculoskeletal malfunction arising from mental factors 04/04/2013  . Incisional hernia 11/15/2012  . Other postprocedural status(V45.89) 11/15/2012  . S/P hernia surgery 11/15/2012  . Abdominal pain 11/11/2012  . Chronic pain syndrome 11/11/2012  . Disorder of sacrum 11/11/2012  . Heartburn 10/04/2011  . DYSPHAGIA 10/04/2011  . Depression with  anxiety 09/15/2010  . CONSTIPATION 09/15/2010  . LUNG NODULE 06/17/2010  . Herpes simplex virus (HSV) infection 06/11/2010  . FATIGUE 06/11/2010  . STRICTURE AND STENOSIS OF ESOPHAGUS 05/13/2010  . ABDOMINAL WALL HERNIA 02/27/2010  . ENDOMETRIOSIS 12/16/2009  . GERD 10/23/2009  . PALPITATIONS 10/23/2009  . 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 DYSTHYMIC DISORDER  06/20/2009  . CONDYLOMA ACUMINATUM 04/23/2009  . LENTIGO 04/23/2009  . DENTAL PAIN 03/19/2009  . CERVICAL RADICULOPATHY 12/11/2008  . Cervical radiculopathy 12/11/2008  . 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 TOBACCO ABUSE 08/07/2008  . BACK PAIN, THORACIC REGION 07/07/2007  . ALLERGIC RHINITIS 06/02/2007    Past Surgical History:  Procedure Laterality Date  . ABDOMINAL SURGERY    . ABDOMINAL WALL MESH  REMOVAL    . ABLATION ON ENDOMETRIOSIS    . CESAREAN SECTION     x2   . DILATION AND CURETTAGE OF UTERUS     x1   . ENDOMETRIAL ABLATION    . esophageal dilatation x 4    . GUM SURGERY    . HERNIA REPAIR     X3  . PARTIAL HYSTERECTOMY       OB History    Gravida  3   Para  2   Term  2   Preterm  0   AB  1   Living  2     SAB  1   IAB  0   Ectopic  0   Multiple  0   Live Births  1           Family History  Problem Relation Age of Onset  . Depression Mother   . Arthritis Mother   . Hyperlipidemia Father   . Hypertension Father     Social History   Tobacco Use  . Smoking status: Current Every Day Smoker    Packs/day: 0.20    Types: Cigarettes  . Smokeless tobacco: Never Used  Vaping Use  . Vaping Use: Never used  Substance Use Topics  . Alcohol use: No  . Drug use: No    Home Medications Prior to Admission medications   Medication Sig Start Date End Date Taking? Authorizing Provider  methocarbamol (ROBAXIN) 500 MG tablet Take 1 tablet (500 mg total) by mouth 2 (two) times daily. 01/06/21  Yes Gareth Morgan, MD  promethazine (PHENERGAN) 25 MG tablet Take 1 tablet (25 mg total) by mouth every 6 (six) hours as needed for nausea or vomiting. 01/06/21  Yes Gareth Morgan, MD  clonazePAM (KLONOPIN) 0.5 MG tablet Take 0.5 mg by mouth 2 (two) times daily.  08/30/18   [provider]  cyclobenzaprine (FLEXERIL) 10 MG tablet Take 10 mg by mouth 3 (three) times daily as needed for muscle spasms.  01/19/19   [provider]  escitalopram (LEXAPRO) 20 MG tablet Take 30 mg by mouth at bedtime. 12/20/18   [provider]  lurasidone (LATUDA) 80 MG TABS tablet Take 80 mg by mouth at bedtime.     [provider]  naloxegol oxalate (MOVANTIK) 25 MG TABS tablet Take 25 mg by mouth daily as needed (constipation).     [provider]  oxyCODONE-acetaminophen (PERCOCET) 7.5-325 MG tablet Take 1 tablet by mouth 4 (four) times daily.    [provider]  promethazine-dextromethorphan (PROMETHAZINE-DM) 6.25-15 MG/5ML syrup Take 5 mLs by mouth 4 (four) times daily as needed for cough. 05/15/20   Daleen Squibb, MD  REXULTI 1 MG TABS  Take 1 tablet by mouth daily.  01/19/19   [provider]    Allergies    Lamictal [lamotrigine], Morphine and related, Nicotine, Zofran, Chantix [varenicline tartrate], Codeine, Haldol [haloperidol], Ibuprofen, Lidoderm [lidocaine], Metaxalone, Sulfa antibiotics, Sulfasalazine, Toradol [ketorolac tromethamine], Duloxetine, Gabapentin, Ingrezza [valbenazine tosylate], Methadone, Pregabalin, Zolpidem, Amoxicillin, Aripiprazole, Penicillins, and Valbenazine  Review of Systems   Review of Systems  Constitutional: Negative for fever.  Respiratory: Negative for cough and shortness of breath.   Cardiovascular: Negative for chest pain.  Gastrointestinal: Positive for constipation (chronic). Negative for abdominal pain, diarrhea, nausea and vomiting.  Genitourinary: Positive for decreased urine volume. Negative for difficulty urinating, dysuria, hematuria, vaginal bleeding and vaginal discharge (not current). Frequency: had but improved.  Musculoskeletal: Positive for back pain. Negative for neck pain.  Skin: Negative for rash.  Neurological: Negative for syncope and headaches.    Physical Exam Updated Vital Signs BP 93/75 (BP Location: Left Arm)   Pulse 75   Temp 98.9 F (37.2 C) (Oral)   Resp 16   Ht 5\' 7"  (1.702 m)   Wt 70.3 kg    LMP 01/01/2004   SpO2 95%   BMI 24.28 kg/m   Physical Exam Vitals and nursing note reviewed.  Constitutional:      General: She is not in acute distress.    Appearance: Normal appearance. She is not ill-appearing, toxic-appearing or diaphoretic.  HENT:     Head: Normocephalic.  Eyes:     Conjunctiva/sclera: Conjunctivae normal.  Cardiovascular:     Rate and Rhythm: Normal rate and regular rhythm.     Pulses: Normal pulses.  Pulmonary:     Effort: Pulmonary effort is normal. No respiratory distress.  Abdominal:     Tenderness: There is abdominal tenderness (left lateral abdomen). There is right CVA tenderness and left CVA tenderness.  Musculoskeletal:        General: No deformity or signs of injury.     Cervical back: No rigidity.  Skin:    General: Skin is warm and dry.     Coloration: Skin is not jaundiced or pale.  Neurological:     General: No focal deficit present.     Mental Status: She is alert and oriented to person, place, and time.     ED Results / Procedures / Treatments   Labs (all labs ordered are listed, but only abnormal results are displayed) Labs Reviewed  COMPREHENSIVE METABOLIC PANEL - Abnormal; Notable for the following components:      Result Value   Calcium 8.8 (*)    Total Protein 6.4 (*)    AST 12 (*)    All other components within normal limits  URINE CULTURE  CBC WITH DIFFERENTIAL/PLATELET  URINALYSIS, ROUTINE W REFLEX MICROSCOPIC    EKG None  Radiology CT Renal Stone Study  Result Date: 01/06/2021 CLINICAL DATA:  Low back and flank pain.  Urinary frequency. EXAM: CT ABDOMEN AND PELVIS WITHOUT CONTRAST TECHNIQUE: Multidetector CT imaging of the abdomen and pelvis was performed following the standard protocol without IV contrast. COMPARISON:  06/30/2019 FINDINGS: Lower chest: Normal Hepatobiliary: Normal Pancreas: Normal Spleen: Normal Adrenals/Urinary Tract: Adrenal glands are normal. Kidneys appear normal without contrast. No stone or  hydronephrosis. No stone along the course of either ureter. No stone in the bladder. Bladder appears normal. Stomach/Bowel: Normal.  No evidence of obstruction or focal lesion. Vascular/Lymphatic: Normal. No atherosclerotic change. No adenopathy. Reproductive: None previous hysterectomy.  No pelvic mass. Other: No free fluid or air. Musculoskeletal: No significant  lumbar degenerative changes. IMPRESSION: Normal examination. No evidence of urinary tract stone disease or other urinary tract pathology. Previous hysterectomy. Electronically Signed   By: Nelson Chimes M.D.   On: 01/06/2021 12:55    Procedures Procedures   Medications Ordered in ED Medications  sodium chloride 0.9 % bolus 1,000 mL (0 mLs Intravenous Stopped 01/06/21 1407)  HYDROmorphone (DILAUDID) injection 1 mg (1 mg Intravenous Given 01/06/21 1303)  metoCLOPramide (REGLAN) injection 10 mg (10 mg Intravenous Given 01/06/21 1303)  promethazine (PHENERGAN) injection 25 mg (25 mg Intravenous Given 01/06/21 1447)  LORazepam (ATIVAN) injection 0.5 mg (0.5 mg Intravenous Given 01/06/21 1445)  methocarbamol (ROBAXIN) 1,000 mg in dextrose 5 % 100 mL IVPB (0 mg Intravenous Stopped 01/06/21 1521)  oxyCODONE (Oxy IR/ROXICODONE) immediate release tablet 5 mg (5 mg Oral Given 01/06/21 1512)    ED Course  I have reviewed the triage vital signs and the nursing notes.  Pertinent labs & imaging results that were available during my care of the patient were reviewed by me and considered in my medical decision making (see chart for details).    MDM Rules/Calculators/A&P                          45 year old female with a history of rheumatoid arthritis, constipation, fibromyalgia, bipolar disorder, presents with concern for bilateral lower back pain. DDx includes appendicitis, pancreatitis, cholecystitis, pyelonephritis, nephrolithiasis, diverticulitis, ovarian torsion. Hx of hysterectomy, doubt ectopic/pid/toa.  CT stone study shows no acute abnormalities. UA  without acute abnormalities. Labs without significant findings.  No epigastric pain, doubt pancreatitis. Normal pulses bilaterally, no known risk factors for emboli, doubt dissection or infarct and do not feel repeat CT imaging with contrast indicated at this time with low suspicion for pathology and noted that she has a history of multiple CT scans and do have concerns regarding cumulalitive radiation.  She has have controlled medication caution noted.  I do not see emergent etiology for her pain.  Has back pain but does not feel like disc, no sign of abnormality on CT, denies numbness/weakness, bowel incontinence and have low suspicion for cauda equina or other emergent etiology of back pain.  Suspect likely muscular etiology. Prior to CT results was given dilaudid for pain, given phenergan for nausea, methocarbamol for possible muscle spasm and small dose of ativan.  She is improved, eating crackers/tolerating po.  Given rx for phenergan and methocarbamol.    Final Clinical Impression(s) / ED Diagnoses Final diagnoses:  Non-intractable vomiting with nausea, unspecified vomiting type  Acute bilateral low back pain without sciatica    Rx / DC Orders ED Discharge Orders         Ordered    promethazine (PHENERGAN) 25 MG tablet  Every 6 hours PRN        01/06/21 1518    methocarbamol (ROBAXIN) 500 MG tablet  2 times daily        01/06/21 1518           Gareth Morgan, MD 01/06/21 2203

## 2021-01-10 LAB — URINE CULTURE: Culture: <10000 — AB

## 2021-03-22 ENCOUNTER — Encounter (HOSPITAL_BASED_OUTPATIENT_CLINIC_OR_DEPARTMENT_OTHER): Payer: Self-pay

## 2021-03-22 ENCOUNTER — Other Ambulatory Visit: Payer: Self-pay

## 2021-03-22 ENCOUNTER — Emergency Department (HOSPITAL_BASED_OUTPATIENT_CLINIC_OR_DEPARTMENT_OTHER)
Admission: EM | Admit: 2021-03-22 | Discharge: 2021-03-23 | Disposition: A | Discharge status: Home / Self Care | Payer: Medicare Other | Attending: Emergency Medicine | Admitting: Emergency Medicine

## 2021-03-22 ENCOUNTER — Emergency Department (HOSPITAL_BASED_OUTPATIENT_CLINIC_OR_DEPARTMENT_OTHER): Payer: Medicare Other

## 2021-03-22 DIAGNOSIS — Z20822 Contact with and (suspected) exposure to covid-19: Secondary | ICD-10-CM | POA: Diagnosis not present

## 2021-03-22 DIAGNOSIS — R1084 Generalized abdominal pain: Secondary | ICD-10-CM | POA: Insufficient documentation

## 2021-03-22 DIAGNOSIS — R197 Diarrhea, unspecified: Secondary | ICD-10-CM | POA: Diagnosis not present

## 2021-03-22 DIAGNOSIS — R112 Nausea with vomiting, unspecified: Secondary | ICD-10-CM | POA: Diagnosis not present

## 2021-03-22 DIAGNOSIS — K219 Gastro-esophageal reflux disease without esophagitis: Secondary | ICD-10-CM | POA: Diagnosis not present

## 2021-03-22 DIAGNOSIS — F17210 Nicotine dependence, cigarettes, uncomplicated: Secondary | ICD-10-CM | POA: Insufficient documentation

## 2021-03-22 LAB — COMPREHENSIVE METABOLIC PANEL
ALT: 14 U/L (ref 0–44)
AST: 15 U/L (ref 15–41)
Albumin: 4.3 g/dL (ref 3.5–5.0)
Alkaline Phosphatase: 75 U/L (ref 38–126)
Anion gap: 8 (ref 5–15)
BUN: 9 mg/dL (ref 6–20)
CO2: 28 mmol/L (ref 22–32)
Calcium: 8.5 mg/dL — ABNORMAL LOW (ref 8.9–10.3)
Chloride: 104 mmol/L (ref 98–111)
Creatinine, Ser: 0.6 mg/dL (ref 0.44–1.00)
GFR, Estimated: >60 mL/min (ref >60)
Glucose, Bld: 85 mg/dL (ref 70–99)
Potassium: 3.7 mmol/L (ref 3.5–5.1)
Sodium: 140 mmol/L (ref 135–145)
Total Bilirubin: 0.5 mg/dL (ref 0.3–1.2)
Total Protein: 6.7 g/dL (ref 6.5–8.1)

## 2021-03-22 LAB — CBC
HCT: 42.7 % (ref 36.0–46.0)
Hemoglobin: 14.6 g/dL (ref 12.0–15.0)
MCH: 32.7 pg (ref 26.0–34.0)
MCHC: 34.2 g/dL (ref 30.0–36.0)
MCV: 95.5 fL (ref 80.0–100.0)
Platelets: 230 10*3/uL (ref 150–400)
RBC: 4.47 MIL/uL (ref 3.87–5.11)
RDW: 12.1 % (ref 11.5–15.5)
WBC: 8.5 10*3/uL (ref 4.0–10.5)
nRBC: 0 % (ref 0.0–0.2)

## 2021-03-22 LAB — CBG MONITORING, ED: Glucose-Capillary: 87 mg/dL (ref 70–99)

## 2021-03-22 LAB — LIPASE, BLOOD: Lipase: 10 U/L — ABNORMAL LOW (ref 11–51)

## 2021-03-22 MED ORDER — SODIUM CHLORIDE 0.9 % IV BOLUS
1000.0000 mL | Freq: Once | INTRAVENOUS | Status: AC
  Administered 2021-03-22: 1000 mL via INTRAVENOUS

## 2021-03-22 MED ORDER — LORAZEPAM 2 MG/ML IJ SOLN
1.0000 mg | Freq: Once | INTRAMUSCULAR | Status: AC
  Administered 2021-03-22: 1 mg via INTRAVENOUS
  Filled 2021-03-22: qty 1

## 2021-03-22 MED ORDER — SODIUM CHLORIDE 0.9 % IV SOLN
25.0000 mg | Freq: Once | INTRAVENOUS | Status: AC
  Administered 2021-03-23: 25 mg via INTRAVENOUS
  Filled 2021-03-22: qty 1

## 2021-03-22 NOTE — ED Triage Notes (Addendum)
Pt c/o N/V/D x 3 weeks worse in the last 2 days. Pt reports swollen abd. Pt states she had a CT scan done 5 weeks ago that showed a mass in her abd.

## 2021-03-23 LAB — URINALYSIS, ROUTINE W REFLEX MICROSCOPIC
Bilirubin Urine: NEGATIVE
Glucose, UA: NEGATIVE mg/dL
Hgb urine dipstick: NEGATIVE
Ketones, ur: NEGATIVE mg/dL
Leukocytes,Ua: NEGATIVE
Nitrite: NEGATIVE
Protein, ur: 30 mg/dL — AB
Specific Gravity, Urine: 1.029 (ref 1.005–1.030)
pH: 6.5 (ref 5.0–8.0)

## 2021-03-23 LAB — RESP PANEL BY RT-PCR (FLU A&B, COVID) ARPGX2
Influenza A by PCR: NEGATIVE
Influenza B by PCR: NEGATIVE
SARS Coronavirus 2 by RT PCR: NEGATIVE

## 2021-03-23 LAB — HCG, QUANTITATIVE, PREGNANCY: hCG, Beta Chain, Quant, S: <1 m[IU]/mL (ref <5)

## 2021-03-23 MED ORDER — FENTANYL CITRATE (PF) 100 MCG/2ML IJ SOLN
50.0000 ug | Freq: Once | INTRAMUSCULAR | Status: AC
Start: 2021-03-23 — End: 2021-03-23
  Administered 2021-03-23: 50 ug via INTRAVENOUS
  Filled 2021-03-23: qty 2

## 2021-03-23 MED ORDER — PROMETHAZINE HCL 25 MG RE SUPP: 25.0000 mg | Freq: Four times a day (QID) | RECTAL | 0 refills | Status: DC | PRN

## 2021-03-23 MED ORDER — DROPERIDOL 2.5 MG/ML IJ SOLN
2.5000 mg | Freq: Once | INTRAMUSCULAR | Status: AC
  Administered 2021-03-23: 2.5 mg via INTRAVENOUS
  Filled 2021-03-23: qty 2

## 2021-03-23 MED ORDER — HYDROCODONE-ACETAMINOPHEN 5-325 MG PO TABS
1.0000 | ORAL_TABLET | Freq: Once | ORAL | Status: DC
  Filled 2021-03-23: qty 1

## 2021-03-23 NOTE — ED Provider Notes (Signed)
[facility] EMERGENCY DEPT Provider Note   CSN: [id number] Arrival date & time: 03/22/21  1627     History Chief Complaint  Patient presents with  . Abdominal Pain    Gina [name] is a 45 y.o. female.  HPI     Is a 45 year old female with a history of eating disorder, bipolar disorder, UTI who presents with abdominal pain.  Patient reports nausea, vomiting, and diarrhea over the last several weeks.  She states over the last several days it has gotten increasingly worse.  She has been in [place] and came here immediately.  Over the last 24 hours, she states she has not been able to keep anything down.  She reports nonbilious, nonbloody emesis.  She also reports intermittent diarrhea.  She feels like the right side of her abdomen has swollen.  She reports having a CT scan by her primary physician recently that showed a "mass that was endometriosis."  She states she has been unable to keep down any of her medications.  She rates her pain at 8 out of 10.  Patient does report occasional marijuana use and smoked twice earlier this week while on vacation.  She denies any daily marijuana use or alcohol abuse.  I have reviewed the patient's chart.  He has had significant work-up for abdominal pain in the past.  She was last seen in the ED on 3/8 and at that time had a stone study that was negative.  I do see where she followed up with her primary physician but I cannot see those results.  Past Medical History:  Diagnosis Date  . ABDOMINAL WALL HERNIA 02/27/2010  . Abnormal Pap smear   . ALLERGIC RHINITIS 06/02/2007  . ANOREXIA, CHRONIC 08/07/2008  . ANXIETY 09/15/2010  . Arthritis   . BACK PAIN, THORACIC REGION 07/07/2007  . Bipolar disorder ([place])   . CERVICAL RADICULOPATHY 12/11/2008  . Condyloma acuminatum 04/23/2009  . CONSTIPATION 09/15/2010  . DYSPHAGIA UNSPECIFIED 09/09/2009  . 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 Eating disorder   . ENDOMETRIOSIS 12/16/2009  .  FATIGUE 06/11/2010  . Fibromyalgia   . GERD 10/23/2009  . Heart palpitations   . HERPES SIMPLEX INFECTION 06/11/2010  . LENTIGO 04/23/2009  . LUNG NODULE 06/17/2010  . Narcotic abuse, continuous ([place])   . Ovarian cyst   . Palpitations 10/23/2009  . Panic disorder   . Renal disorder   . Rheumatoid arthritis(714.0)   . Stricture and stenosis of esophagus 05/13/2010  . TOBACCO ABUSE 08/07/2008  . Urinary tract infection   . UTI (urinary tract infection)     Patient Active Problem List   Diagnosis Date Noted  . Piriformis syndrome of both sides 05/31/2019  . History of bipolar disorder 04/18/2019  . Tardive dyskinesia 04/18/2019  . Urticaria due to drug allergy 04/18/2019  . Lumbar spondylosis 02/05/2019  . Bipolar affective disorder, current episode mixed ([place]) 01/02/2019  . Arthritis 01/02/2019  . Cigarette nicotine dependence without complication 40/06/6760  . Bilateral temporomandibular joint pain 10/02/2018  . Referred otalgia, bilateral 10/02/2018  . Paresthesias 03/09/2018  . Common migraine 04/22/2016  . Bipolar 1 disorder, mixed, moderate ([facility]) 07/15/2015    Class: Chronic  . 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 Sciatica 01/09/2015  . Reactive hypoglycemia 12/17/2013  . Musculoskeletal malfunction arising from mental factors 04/04/2013  . Incisional hernia 11/15/2012  . Other postprocedural status(V45.89) 11/15/2012  . S/P hernia surgery 11/15/2012  . Abdominal pain 11/11/2012  . Chronic pain  syndrome 11/11/2012  . Disorder of sacrum 11/11/2012  . Heartburn 10/04/2011  . DYSPHAGIA 10/04/2011  . Depression with anxiety 09/15/2010  . CONSTIPATION 09/15/2010  . LUNG NODULE 06/17/2010  . Herpes simplex virus (HSV) infection 06/11/2010  . FATIGUE 06/11/2010  . STRICTURE AND STENOSIS OF ESOPHAGUS 05/13/2010  . ABDOMINAL WALL HERNIA 02/27/2010  . ENDOMETRIOSIS 12/16/2009  . GERD 10/23/2009  . PALPITATIONS 10/23/2009  . PANIC DISORDER WITH  AGORAPHOBIA 08/26/2009  . 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 CONDYLOMA ACUMINATUM 04/23/2009  . LENTIGO 04/23/2009  . DENTAL PAIN 03/19/2009  . CERVICAL RADICULOPATHY 12/11/2008  . Cervical radiculopathy 12/11/2008  . 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 TOBACCO ABUSE 08/07/2008  . BACK PAIN, THORACIC REGION 07/07/2007  . ALLERGIC RHINITIS 06/02/2007    Past Surgical History:  Procedure Laterality Date  . ABDOMINAL SURGERY    . ABDOMINAL WALL MESH  REMOVAL    . ABLATION ON ENDOMETRIOSIS    . CESAREAN SECTION     x2   . DILATION AND CURETTAGE OF UTERUS     x1   . ENDOMETRIAL ABLATION    . esophageal dilatation x 4    . GUM SURGERY    . HERNIA REPAIR     X3  . PARTIAL HYSTERECTOMY       OB History    Gravida  3   Para  2   Term  2   Preterm  0   AB  1   Living  2     SAB  1   IAB  0   Ectopic  0   Multiple  0   Live Births  1           Family History  Problem Relation Age of Onset  . Depression Mother   . Arthritis Mother   . Hyperlipidemia Father   . Hypertension Father     Social History   Tobacco Use  . Smoking status: Current Every Day Smoker    Packs/day: 0.20    Types: Cigarettes  . Smokeless tobacco: Never Used  Vaping Use  . Vaping Use: Never used  Substance Use Topics  . Alcohol use: No  . Drug use: No    Home Medications Prior to Admission medications   Medication Sig Start Date End Date Taking? Authorizing Provider  promethazine (PHENERGAN) 25 MG suppository Place 1 suppository (25 mg total) rectally every 6 (six) hours as needed for nausea or vomiting. 03/23/21  Yes Shantanique Hodo, Barbette Hair, MD  clonazePAM (KLONOPIN) 0.5 MG tablet Take 0.5 mg by mouth 2 (two) times daily.  08/30/18   [provider]  cyclobenzaprine (FLEXERIL) 10 MG tablet Take 10 mg by mouth 3 (three) times daily as needed for muscle spasms.  01/19/19   [provider]  escitalopram (LEXAPRO) 20 MG tablet Take 30 mg by mouth at  bedtime. 12/20/18   [provider]  lurasidone (LATUDA) 80 MG TABS tablet Take 80 mg by mouth at bedtime.     [provider]  methocarbamol (ROBAXIN) 500 MG tablet Take 1 tablet (500 mg total) by mouth 2 (two) times daily. 01/06/21   Gareth Morgan, MD  naloxegol oxalate (MOVANTIK) 25 MG TABS tablet Take 25 mg by mouth daily as needed (constipation).     [provider]  oxyCODONE-acetaminophen (PERCOCET) 7.5-325 MG tablet Take 1 tablet by mouth 4 (four) times daily.    [provider]  promethazine (PHENERGAN) 25 MG tablet Take 1 tablet (25 mg total)  by mouth every 6 (six) hours as needed for nausea or vomiting. 01/06/21   Gareth Morgan, MD  promethazine-dextromethorphan (PROMETHAZINE-DM) 6.25-15 MG/5ML syrup Take 5 mLs by mouth 4 (four) times daily as needed for cough. 05/15/20   Daleen Squibb, MD  REXULTI 1 MG TABS Take 1 tablet by mouth daily.  01/19/19   [provider]    Allergies    Lamictal [lamotrigine], Morphine and related, Nicotine, Zofran, Chantix [varenicline tartrate], Codeine, Haldol [haloperidol], Ibuprofen, Lidoderm [lidocaine], Metaxalone, Sulfa antibiotics, Sulfasalazine, Toradol [ketorolac tromethamine], Duloxetine, Gabapentin, Ingrezza [valbenazine tosylate], Methadone, Pregabalin, Zolpidem, Amoxicillin, Aripiprazole, Penicillins, and Valbenazine  Review of Systems   Review of Systems  Constitutional: Negative for fever.  Respiratory: Negative for shortness of breath.   Cardiovascular: Negative for chest pain.  Gastrointestinal: Positive for abdominal pain, diarrhea, nausea and vomiting.  Genitourinary: Negative for dysuria.  All other systems reviewed and are negative.   Physical Exam Updated Vital Signs BP 97/61 (BP Location: Right Arm)   Pulse 62   Temp 98.5 F (36.9 C) (Oral)   Resp 18   Ht 1.702 m (5\' 7" )   Wt 72.6 kg   LMP 01/01/2004   SpO2 98%   BMI 25.06 kg/m   Physical Exam Vitals and nursing  note reviewed.  Constitutional:      Appearance: She is well-developed. She is not ill-appearing.  HENT:     Head: Normocephalic and atraumatic.     Mouth/Throat:     Mouth: Mucous membranes are moist.  Eyes:     Pupils: Pupils are equal, round, and reactive to light.  Cardiovascular:     Rate and Rhythm: Normal rate and regular rhythm.     Heart sounds: Normal heart sounds.  Pulmonary:     Effort: Pulmonary effort is normal. No respiratory distress.     Breath sounds: No wheezing.  Abdominal:     General: Bowel sounds are normal.     Palpations: Abdomen is soft.     Tenderness: There is generalized abdominal tenderness. There is no guarding or rebound.  Musculoskeletal:     Cervical back: Neck supple.  Skin:    General: Skin is warm and dry.  Neurological:     Mental Status: She is alert and oriented to person, place, and time.  Psychiatric:        Mood and Affect: Mood normal.     ED Results / Procedures / Treatments   Labs (all labs ordered are listed, but only abnormal results are displayed) Labs Reviewed  LIPASE, BLOOD - Abnormal; Notable for the following components:      Result Value   Lipase 10 (*)    All other components within normal limits  COMPREHENSIVE METABOLIC PANEL - Abnormal; Notable for the following components:   Calcium 8.5 (*)    All other components within normal limits  URINALYSIS, ROUTINE W REFLEX MICROSCOPIC - Abnormal; Notable for the following components:   APPearance HAZY (*)    Protein, ur 30 (*)    Bacteria, UA RARE (*)    All other components within normal limits  RESP PANEL BY RT-PCR (FLU A&B, COVID) ARPGX2  CBC  HCG, QUANTITATIVE, PREGNANCY  CBG MONITORING, ED    EKG None  Radiology CT ABDOMEN PELVIS WO CONTRAST  Result Date: 03/22/2021 CLINICAL DATA:  Abdominal distension. EXAM: CT ABDOMEN AND PELVIS WITHOUT CONTRAST TECHNIQUE: Multidetector CT imaging of the abdomen and pelvis was performed following the standard protocol  without IV contrast. COMPARISON:  January 06, 2021.  June 30, 2019. FINDINGS: Lower chest: No acute abnormality. Hepatobiliary: No focal liver abnormality is seen. No gallstones, gallbladder wall thickening, or biliary dilatation. Pancreas: Unremarkable. No pancreatic ductal dilatation or surrounding inflammatory changes. Spleen: Normal in size without focal abnormality. Adrenals/Urinary Tract: Adrenal glands are unremarkable. Kidneys are normal, without renal calculi, focal lesion, or hydronephrosis. Bladder is unremarkable. Stomach/Bowel: Stomach is within normal limits. Appendix appears normal. No evidence of bowel wall thickening, distention, or inflammatory changes. Vascular/Lymphatic: No significant vascular findings are present. No enlarged abdominal or pelvic lymph nodes. Reproductive: Status post hysterectomy. No adnexal masses. Other: No abdominal wall hernia or abnormality. No abdominopelvic ascites. Musculoskeletal: No acute or significant osseous findings. IMPRESSION: No acute abnormality seen in the abdomen or pelvis. Electronically Signed   By: Marijo Conception M.D.   On: 03/22/2021 20:21    Procedures Procedures   Medications Ordered in ED Medications  HYDROcodone-acetaminophen (NORCO/VICODIN) 5-325 MG per tablet 1 tablet (1 tablet Oral Patient Refused/Not Given 03/23/21 0102)  sodium chloride 0.9 % bolus 1,000 mL (0 mLs Intravenous Stopped 03/23/21 0052)  promethazine (PHENERGAN) 25 mg in sodium chloride 0.9 % 50 mL IVPB (0 mg Intravenous Stopped 03/23/21 0052)  LORazepam (ATIVAN) injection 1 mg (1 mg Intravenous Given 03/22/21 2351)  fentaNYL (SUBLIMAZE) injection 50 mcg (50 mcg Intravenous Given 03/23/21 0110)  droperidol (INAPSINE) 2.5 MG/ML injection 2.5 mg (2.5 mg Intravenous Given 03/23/21 0150)    ED Course  I have reviewed the triage vital signs and the nursing notes.  Pertinent labs & imaging results that were available during my care of the patient were reviewed by me and  considered in my medical decision making (see chart for details).  Clinical Course as of 03/23/21 0349  Mon Mar 23, 2021  0237 Patient resting comfortably on recheck.  Still awaiting COVID testing. [CH]    Clinical Course User Index [CH] Treyon Wymore, Barbette Hair, MD   MDM Rules/Calculators/A&P                          Patient presents with nausea vomiting and abdominal pain.  Symptoms appear acute on chronic.  She has had several evaluations for the same both recently and in the past.  She had work-up sent from triage.  I have reviewed her work-up which is largely reassuring.  No significant evidence of dehydration.  No LFT abnormality.  No indication of pancreatitis.  She does report some marijuana use although denies daily use.  This could exacerbate her symptoms.  She also has a history of eating disorder and I question if some of her symptoms may be related to motility or gastroparesis.  Patient was given multiple medications.  She repeatedly asked for pain medication; ultimately she received droperidol with good relief of her symptoms.  Do not think narcotic pain medications are appropriate as I have concerns for motility and this will make those issues worse.  CT scan did not show any acute findings and no mass.  I shared this with the patient.  Recommend that she follow-up closely with her primary physician and gastroenterology.  She will be given Phenergan suppositories for her ongoing emesis.   After history, exam, and medical workup I feel the patient has been appropriately medically screened and is safe for discharge home. Pertinent diagnoses were discussed with the patient. Patient was given return precautions.  Final Clinical Impression(s) / ED Diagnoses Final diagnoses:  Non-intractable vomiting with nausea, unspecified vomiting type  Rx / DC Orders ED Discharge Orders         Ordered    promethazine (PHENERGAN) 25 MG suppository  Every 6 hours PRN        03/23/21 0342            Merryl Hacker, MD 03/23/21 [phone]

## 2021-03-23 NOTE — Discharge Instructions (Addendum)
You were seen today for nausea and vomiting and abdominal pain.  Your work-up is largely reassuring.  Make sure that you are staying hydrated.  Take antiemetics as prescribed.  Given your ongoing symptoms, you may benefit from gastroenterology evaluation and/or motility studies.

## 2021-03-23 NOTE — ED Notes (Signed)
Patient PO challenged at this time. Pt able to hold down fluids and saltine crackers. Pt repeatedly asking for dilaudid for pain. Dr. Dina Rich notified.

## 2021-11-03 ENCOUNTER — Encounter (HOSPITAL_BASED_OUTPATIENT_CLINIC_OR_DEPARTMENT_OTHER): Payer: Self-pay | Admitting: Emergency Medicine

## 2021-11-03 ENCOUNTER — Emergency Department (HOSPITAL_BASED_OUTPATIENT_CLINIC_OR_DEPARTMENT_OTHER)
Admission: EM | Admit: 2021-11-03 | Discharge: 2021-11-03 | Disposition: A | Discharge status: Home / Self Care | Payer: PPO | Attending: Emergency Medicine | Admitting: Emergency Medicine

## 2021-11-03 ENCOUNTER — Other Ambulatory Visit: Payer: Self-pay

## 2021-11-03 ENCOUNTER — Emergency Department (HOSPITAL_BASED_OUTPATIENT_CLINIC_OR_DEPARTMENT_OTHER): Payer: PPO | Admitting: Radiology

## 2021-11-03 DIAGNOSIS — R0781 Pleurodynia: Secondary | ICD-10-CM | POA: Diagnosis not present

## 2021-11-03 DIAGNOSIS — R059 Cough, unspecified: Secondary | ICD-10-CM | POA: Diagnosis not present

## 2021-11-03 DIAGNOSIS — Z20822 Contact with and (suspected) exposure to covid-19: Secondary | ICD-10-CM | POA: Diagnosis not present

## 2021-11-03 DIAGNOSIS — Z8616 Personal history of COVID-19: Secondary | ICD-10-CM | POA: Insufficient documentation

## 2021-11-03 DIAGNOSIS — R079 Chest pain, unspecified: Secondary | ICD-10-CM | POA: Diagnosis not present

## 2021-11-03 DIAGNOSIS — R0602 Shortness of breath: Secondary | ICD-10-CM | POA: Insufficient documentation

## 2021-11-03 DIAGNOSIS — R0789 Other chest pain: Secondary | ICD-10-CM | POA: Diagnosis not present

## 2021-11-03 DIAGNOSIS — R0782 Intercostal pain: Secondary | ICD-10-CM | POA: Diagnosis not present

## 2021-11-03 LAB — COMPREHENSIVE METABOLIC PANEL
ALT: 7 U/L (ref 0–44)
AST: 12 U/L — ABNORMAL LOW (ref 15–41)
Albumin: 3.9 g/dL (ref 3.5–5.0)
Alkaline Phosphatase: 82 U/L (ref 38–126)
Anion gap: 6 (ref 5–15)
BUN: 11 mg/dL (ref 6–20)
CO2: 24 mmol/L (ref 22–32)
Calcium: 8.7 mg/dL — ABNORMAL LOW (ref 8.9–10.3)
Chloride: 105 mmol/L (ref 98–111)
Creatinine, Ser: 0.48 mg/dL (ref 0.44–1.00)
GFR, Estimated: >60 mL/min (ref >60)
Glucose, Bld: 87 mg/dL (ref 70–99)
Potassium: 4.4 mmol/L (ref 3.5–5.1)
Sodium: 135 mmol/L (ref 135–145)
Total Bilirubin: 0.4 mg/dL (ref 0.3–1.2)
Total Protein: 6.1 g/dL — ABNORMAL LOW (ref 6.5–8.1)

## 2021-11-03 LAB — URINALYSIS, ROUTINE W REFLEX MICROSCOPIC
Bilirubin Urine: NEGATIVE
Glucose, UA: NEGATIVE mg/dL
Hgb urine dipstick: NEGATIVE
Ketones, ur: NEGATIVE mg/dL
Leukocytes,Ua: NEGATIVE
Nitrite: NEGATIVE
Specific Gravity, Urine: 1.022 (ref 1.005–1.030)
pH: 7 (ref 5.0–8.0)

## 2021-11-03 LAB — LIPASE, BLOOD: Lipase: <10 U/L — ABNORMAL LOW (ref 11–51)

## 2021-11-03 LAB — CBC
HCT: 41.1 % (ref 36.0–46.0)
Hemoglobin: 13.8 g/dL (ref 12.0–15.0)
MCH: 31.6 pg (ref 26.0–34.0)
MCHC: 33.6 g/dL (ref 30.0–36.0)
MCV: 94.1 fL (ref 80.0–100.0)
Platelets: 260 10*3/uL (ref 150–400)
RBC: 4.37 MIL/uL (ref 3.87–5.11)
RDW: 12.3 % (ref 11.5–15.5)
WBC: 7.6 10*3/uL (ref 4.0–10.5)
nRBC: 0 % (ref 0.0–0.2)

## 2021-11-03 LAB — RESP PANEL BY RT-PCR (FLU A&B, COVID) ARPGX2
Influenza A by PCR: NEGATIVE
Influenza B by PCR: NEGATIVE
SARS Coronavirus 2 by RT PCR: NEGATIVE

## 2021-11-03 LAB — TROPONIN I (HIGH SENSITIVITY): Troponin I (High Sensitivity): <2 ng/L (ref <18)

## 2021-11-03 LAB — D-DIMER, QUANTITATIVE: D-Dimer, Quant: <0.27 ug/mL-FEU (ref 0.00–0.50)

## 2021-11-03 MED ORDER — PROMETHAZINE HCL 25 MG/ML IJ SOLN
INTRAMUSCULAR | Status: AC
  Filled 2021-11-03: qty 1

## 2021-11-03 MED ORDER — HYDROMORPHONE HCL 1 MG/ML IJ SOLN
0.5000 mg | Freq: Once | INTRAMUSCULAR | Status: AC
  Administered 2021-11-03: 0.5 mg via INTRAVENOUS
  Filled 2021-11-03: qty 1

## 2021-11-03 MED ORDER — SODIUM CHLORIDE 0.9 % IV SOLN
12.5000 mg | Freq: Once | INTRAVENOUS | Status: AC
  Administered 2021-11-03: 12.5 mg via INTRAVENOUS
  Filled 2021-11-03: qty 0.5

## 2021-11-03 MED ORDER — KETOROLAC TROMETHAMINE 30 MG/ML IJ SOLN
60.0000 mg | Freq: Once | INTRAMUSCULAR | Status: AC
  Administered 2021-11-03: 60 mg via INTRAVENOUS
  Filled 2021-11-03: qty 2

## 2021-11-03 NOTE — ED Notes (Signed)
12.5 mg phenergan mixed with 50 cc NS given over 3o mins

## 2021-11-03 NOTE — ED Provider Notes (Signed)
[facility] EMERGENCY DEPT Provider Note   CSN: [id number] Arrival date & time: 11/03/21  1213     History  Chief Complaint  Patient presents with   Chest Pain    Gina [name] is a 46 y.o. female with a past medical history of anxiety, chronic pain syndrome, bipolar disorder and many other medical conditions presenting today with complaint of left rib pain.  Patient reports that she tested positive for COVID-19 on 12/22.  At that time her symptoms were fever, cough and congestion.  She began testing negative for COVID over the past few days however is now complaining of severe lower rib pain that is worse when she takes a deep breath.  No history of DVT, recent travel, recent surgery, OCP use or PE.  Says that the area is "extremely tender to the touch."  Denies any chest pain.  Requesting something for the pain, reports that "I have a lot of medication allergies and usually the only thing that works is Dilaudid."   Chest Pain Associated symptoms: cough and shortness of breath   Associated symptoms: no abdominal pain, no back pain and no fever       Home Medications Prior to Admission medications   Medication Sig Start Date End Date Taking? Authorizing Provider  clonazePAM (KLONOPIN) 0.5 MG tablet Take 0.5 mg by mouth 2 (two) times daily.  08/30/18   [provider]  cyclobenzaprine (FLEXERIL) 10 MG tablet Take 10 mg by mouth 3 (three) times daily as needed for muscle spasms.  01/19/19   [provider]  escitalopram (LEXAPRO) 20 MG tablet Take 30 mg by mouth at bedtime. 12/20/18   [provider]  lurasidone (LATUDA) 80 MG TABS tablet Take 80 mg by mouth at bedtime.     [provider]  methocarbamol (ROBAXIN) 500 MG tablet Take 1 tablet (500 mg total) by mouth 2 (two) times daily. 01/06/21   Gareth Morgan, MD  naloxegol oxalate (MOVANTIK) 25 MG TABS tablet Take 25 mg by mouth daily as needed (constipation).     [provider]  oxyCODONE-acetaminophen (PERCOCET) 7.5-325 MG tablet Take 1 tablet by mouth 4 (four) times daily.    [provider]  promethazine (PHENERGAN) 25 MG suppository Place 1 suppository (25 mg total) rectally every 6 (six) hours as needed for nausea or vomiting. 03/23/21   Horton, Barbette Hair, MD  promethazine (PHENERGAN) 25 MG tablet Take 1 tablet (25 mg total) by mouth every 6 (six) hours as needed for nausea or vomiting. 01/06/21   Gareth Morgan, MD  promethazine-dextromethorphan (PROMETHAZINE-DM) 6.25-15 MG/5ML syrup Take 5 mLs by mouth 4 (four) times daily as needed for cough. 05/15/20   Daleen Squibb, MD  REXULTI 1 MG TABS Take 1 tablet by mouth daily.  01/19/19   [provider]      Allergies    Lamictal [lamotrigine], Morphine and related, Nicotine, Zofran, Chantix [varenicline tartrate], Codeine, Haldol [haloperidol], Ibuprofen, Lidoderm [lidocaine], Metaxalone, Sulfa antibiotics, Sulfasalazine, Toradol [ketorolac tromethamine], Duloxetine, Gabapentin, Ingrezza [valbenazine tosylate], Methadone, Pregabalin, Zolpidem, Amoxicillin, Aripiprazole, Penicillins, and Valbenazine    Review of Systems   Review of Systems  Constitutional:  Negative for chills and fever.  HENT:  Negative for congestion.   Respiratory:  Positive for cough, chest tightness and shortness of breath.   Cardiovascular:  Negative for chest pain.  Gastrointestinal:  Negative for abdominal pain.  Musculoskeletal:  Negative for back pain.   Physical Exam Updated Vital Signs BP 101/67  Pulse 64    Temp 98.9 F (37.2 C) (Temporal)    Resp (!) 25    Ht 5\' 7"  (1.702 m)    Wt 65.3 kg    LMP 01/01/2004    SpO2 97%    BMI 22.55 kg/m  Physical Exam Vitals and nursing note reviewed.  Constitutional:      Appearance: Normal appearance.  HENT:     Head: Normocephalic and atraumatic.  Eyes:     General: No scleral icterus.    Conjunctiva/sclera: Conjunctivae normal.  Cardiovascular:      Rate and Rhythm: Normal rate and regular rhythm.  Pulmonary:     Effort: Pulmonary effort is normal. No respiratory distress.     Breath sounds: Normal breath sounds.  Abdominal:     Palpations: There is no fluid wave.     Tenderness: There is no abdominal tenderness.  Musculoskeletal:     Right lower leg: No edema.     Left lower leg: No edema.     Comments: Tenderness to lateral left rib cage  Skin:    Findings: No rash.  Neurological:     Mental Status: She is alert.  Psychiatric:        Mood and Affect: Mood normal.    ED Results / Procedures / Treatments   Labs (all labs ordered are listed, but only abnormal results are displayed) Labs Reviewed  LIPASE, BLOOD - Abnormal; Notable for the following components:      Result Value   Lipase <10 (*)    All other components within normal limits  COMPREHENSIVE METABOLIC PANEL - Abnormal; Notable for the following components:   Calcium 8.7 (*)    Total Protein 6.1 (*)    AST 12 (*)    All other components within normal limits  URINALYSIS, ROUTINE W REFLEX MICROSCOPIC - Abnormal; Notable for the following components:   APPearance HAZY (*)    Protein, ur TRACE (*)    All other components within normal limits  RESP PANEL BY RT-PCR (FLU A&B, COVID) ARPGX2  CBC  D-DIMER, QUANTITATIVE  TROPONIN I (HIGH SENSITIVITY)    EKG None  Radiology DG Chest 2 View  Result Date: 11/03/2021 CLINICAL DATA:  Chest pain EXAM: CHEST - 2 VIEW COMPARISON:  08/30/2014 FINDINGS: Cardiac and mediastinal contours are within normal limits. No focal pulmonary opacity. No pleural effusion or pneumothorax. No acute osseous abnormality. IMPRESSION: No acute cardiopulmonary process. Electronically Signed   By: Merilyn Baba M.D.   On: 11/03/2021 13:06    Procedures Procedures    Medications Ordered in ED Medications  ketorolac (TORADOL) 30 MG/ML injection 60 mg (has no administration in time range)  promethazine (PHENERGAN) 12.5 mg in sodium  chloride 0.9 % 50 mL IVPB (has no administration in time range)  HYDROmorphone (DILAUDID) injection 0.5 mg (0.5 mg Intravenous Given 11/03/21 1509)    ED Course/ Medical Decision Making/ A&P                           Medical Decision Making  Patient presents to the ED for concern of left-sided rib pain, this involves an extensive number of treatment options, and is a complaint that carries with it a high risk of complications and morbidity.  The differential diagnosis includes but is not limited to ACS, PE, pneumonia, pneumothorax, costochondritis, rib fracture, malignancy.   Co morbidities that complicate the patient evaluation  Anxiety, multiple drug allergies  Additional history obtained:  Additional history obtained from internal and external records revealing frequent emergency department visits for several different complaints.  Potentially due to opioid disorder.   Lab Tests:  I Ordered, and personally interpreted labs.  The pertinent results include: N/A, unremarkable work-up   Imaging Studies ordered:  I ordered imaging studies including chest x-ray I independently visualized and interpreted imaging which was negative.  I agree with radiologist interpretation.   Medicines ordered and prescription drug management:  I ordered medication including Dilaudid for pain.  Only a small dose was given. Reevaluation of the patient after these medicines showed that the patient improved I have reviewed the patients home medicines and have made adjustments as needed   Test Considered:  Low suspicion for DVT/PE.  Wells low likelihood.  Dimer ordered due to patient's anxiety and this was negative.  Problem List / ED Course:  Rib pain  Reevaluation:  After the interventions noted above, I reevaluated the patient and found that they have : Continued anxiety about what is going on.  Requesting Toradol even though earlier she told me she was allergic.  Patient is very clearly  anxious/desiring pain medication.  She was reassured that the emergencies have been ruled out and that she can follow-up with a primary care provider.   Social Determinants of Health:  No primary care  Dispostion:  After consideration of the diagnostic results and the patients response to treatment, I feel that the patent would benefit from a referral to a primary care provider.        Final Clinical Impression(s) / ED Diagnoses Final diagnoses:  Rib pain on left side    Rx / DC Orders Results and diagnoses were explained to the patient. Return precautions discussed in full. Patient had no additional questions and expressed complete understanding.   This chart was dictated using voice recognition software.  Despite best efforts to proofread,  errors can occur which can change the documentation meaning.    Rhae Hammock, PA-C 11/03/21 Harris, Ankit, MD 11/04/21 [phone]

## 2021-11-03 NOTE — Discharge Instructions (Addendum)
Your work-up is negative today.  You have not had a heart attack and you do not have a blood clot.  It is important for you to follow-up about this problem with your primary care provider.  Because you do not have 1 there is one attached to these discharge papers.  Tylenol, naproxen and other over-the-counter pain medications should help your symptoms.

## 2021-11-03 NOTE — ED Triage Notes (Signed)
Pt arrives to ED with c/o chest pain. This started yesterday while pt was at rest. Associated symptoms include shortness of breath, rib pain. The pain is constant and described as stabbing. Pt reports it is painful to breath deep and cough. Recently tested positive for COVID on 12/22.

## 2021-11-13 DIAGNOSIS — M940 Chondrocostal junction syndrome [Tietze]: Secondary | ICD-10-CM | POA: Diagnosis not present

## 2021-11-13 DIAGNOSIS — K219 Gastro-esophageal reflux disease without esophagitis: Secondary | ICD-10-CM | POA: Diagnosis not present

## 2021-11-13 DIAGNOSIS — M797 Fibromyalgia: Secondary | ICD-10-CM | POA: Diagnosis not present

## 2021-11-16 ENCOUNTER — Ambulatory Visit (HOSPITAL_COMMUNITY)
Admission: RE | Admit: 2021-11-16 | Discharge: 2021-11-16 | Disposition: A | Discharge status: Home / Self Care | Payer: PPO | Source: Ambulatory Visit | Attending: Internal Medicine | Admitting: Internal Medicine

## 2021-11-16 ENCOUNTER — Other Ambulatory Visit: Payer: Self-pay

## 2021-11-16 ENCOUNTER — Other Ambulatory Visit (HOSPITAL_COMMUNITY): Payer: Self-pay | Admitting: Internal Medicine

## 2021-11-16 ENCOUNTER — Other Ambulatory Visit: Payer: Self-pay | Admitting: Internal Medicine

## 2021-11-16 DIAGNOSIS — R0781 Pleurodynia: Secondary | ICD-10-CM | POA: Diagnosis not present

## 2021-11-26 DIAGNOSIS — F3181 Bipolar II disorder: Secondary | ICD-10-CM | POA: Diagnosis not present

## 2021-11-26 DIAGNOSIS — F902 Attention-deficit hyperactivity disorder, combined type: Secondary | ICD-10-CM | POA: Diagnosis not present

## 2022-02-04 DIAGNOSIS — N959 Unspecified menopausal and perimenopausal disorder: Secondary | ICD-10-CM | POA: Diagnosis not present

## 2022-02-04 DIAGNOSIS — R102 Pelvic and perineal pain: Secondary | ICD-10-CM | POA: Diagnosis not present

## 2022-02-04 DIAGNOSIS — N760 Acute vaginitis: Secondary | ICD-10-CM | POA: Diagnosis not present

## 2022-02-06 DIAGNOSIS — K1370 Unspecified lesions of oral mucosa: Secondary | ICD-10-CM | POA: Diagnosis not present

## 2022-02-09 DIAGNOSIS — K118 Other diseases of salivary glands: Secondary | ICD-10-CM | POA: Diagnosis not present

## 2022-02-09 DIAGNOSIS — J343 Hypertrophy of nasal turbinates: Secondary | ICD-10-CM | POA: Diagnosis not present

## 2022-02-09 DIAGNOSIS — F17210 Nicotine dependence, cigarettes, uncomplicated: Secondary | ICD-10-CM | POA: Diagnosis not present

## 2022-02-17 DIAGNOSIS — F4312 Post-traumatic stress disorder, chronic: Secondary | ICD-10-CM | POA: Diagnosis not present

## 2022-02-17 DIAGNOSIS — F3181 Bipolar II disorder: Secondary | ICD-10-CM | POA: Diagnosis not present

## 2022-02-23 DIAGNOSIS — Z1152 Encounter for screening for COVID-19: Secondary | ICD-10-CM | POA: Diagnosis not present

## 2022-02-23 DIAGNOSIS — R0981 Nasal congestion: Secondary | ICD-10-CM | POA: Diagnosis not present

## 2022-02-23 DIAGNOSIS — J029 Acute pharyngitis, unspecified: Secondary | ICD-10-CM | POA: Diagnosis not present

## 2022-02-23 DIAGNOSIS — M797 Fibromyalgia: Secondary | ICD-10-CM | POA: Diagnosis not present

## 2022-02-23 DIAGNOSIS — H6690 Otitis media, unspecified, unspecified ear: Secondary | ICD-10-CM | POA: Diagnosis not present

## 2022-02-23 DIAGNOSIS — R051 Acute cough: Secondary | ICD-10-CM | POA: Diagnosis not present

## 2022-02-23 DIAGNOSIS — Z79899 Other long term (current) drug therapy: Secondary | ICD-10-CM | POA: Diagnosis not present

## 2022-02-23 DIAGNOSIS — R5383 Other fatigue: Secondary | ICD-10-CM | POA: Diagnosis not present

## 2022-03-01 NOTE — Patient Outreach (Addendum)
Received a Nurse Call Center notification for Ms. Gina [name] . ?Patient has [facility]. Following their workflow I have sent this referral to their Care Management group through email [email or web address] for Care Management.   ?  ?[facility] Care, CBCS, CMAA ?[place] Management Assistant ?[facility] ?[phone]   ?

## 2022-03-02 DIAGNOSIS — R1030 Lower abdominal pain, unspecified: Secondary | ICD-10-CM | POA: Diagnosis not present

## 2022-03-02 DIAGNOSIS — K219 Gastro-esophageal reflux disease without esophagitis: Secondary | ICD-10-CM | POA: Diagnosis not present

## 2022-03-02 DIAGNOSIS — F17200 Nicotine dependence, unspecified, uncomplicated: Secondary | ICD-10-CM | POA: Diagnosis not present

## 2022-03-02 DIAGNOSIS — Z0000 Encounter for general adult medical examination without abnormal findings: Secondary | ICD-10-CM | POA: Diagnosis not present

## 2022-03-02 DIAGNOSIS — R413 Other amnesia: Secondary | ICD-10-CM | POA: Diagnosis not present

## 2022-03-02 DIAGNOSIS — Z1272 Encounter for screening for malignant neoplasm of vagina: Secondary | ICD-10-CM | POA: Diagnosis not present

## 2022-03-02 DIAGNOSIS — R102 Pelvic and perineal pain: Secondary | ICD-10-CM | POA: Diagnosis not present

## 2022-03-02 DIAGNOSIS — M069 Rheumatoid arthritis, unspecified: Secondary | ICD-10-CM | POA: Diagnosis not present

## 2022-03-02 DIAGNOSIS — L989 Disorder of the skin and subcutaneous tissue, unspecified: Secondary | ICD-10-CM | POA: Diagnosis not present

## 2022-03-02 DIAGNOSIS — M797 Fibromyalgia: Secondary | ICD-10-CM | POA: Diagnosis not present

## 2022-03-02 DIAGNOSIS — Z124 Encounter for screening for malignant neoplasm of cervix: Secondary | ICD-10-CM | POA: Diagnosis not present

## 2022-03-02 DIAGNOSIS — F319 Bipolar disorder, unspecified: Secondary | ICD-10-CM | POA: Diagnosis not present

## 2022-03-02 DIAGNOSIS — N809 Endometriosis, unspecified: Secondary | ICD-10-CM | POA: Diagnosis not present

## 2022-03-02 DIAGNOSIS — M7918 Myalgia, other site: Secondary | ICD-10-CM | POA: Diagnosis not present

## 2022-03-02 DIAGNOSIS — R82998 Other abnormal findings in urine: Secondary | ICD-10-CM | POA: Diagnosis not present

## 2022-03-02 LAB — HM PAP SMEAR

## 2022-03-10 DIAGNOSIS — K660 Peritoneal adhesions (postprocedural) (postinfection): Secondary | ICD-10-CM | POA: Diagnosis not present

## 2022-03-10 DIAGNOSIS — R102 Pelvic and perineal pain: Secondary | ICD-10-CM | POA: Diagnosis not present

## 2022-04-08 DIAGNOSIS — R140 Abdominal distension (gaseous): Secondary | ICD-10-CM | POA: Diagnosis not present

## 2022-04-08 DIAGNOSIS — R109 Unspecified abdominal pain: Secondary | ICD-10-CM | POA: Diagnosis not present

## 2022-04-09 DIAGNOSIS — R635 Abnormal weight gain: Secondary | ICD-10-CM | POA: Diagnosis not present

## 2022-04-13 DIAGNOSIS — F902 Attention-deficit hyperactivity disorder, combined type: Secondary | ICD-10-CM | POA: Diagnosis not present

## 2022-04-13 DIAGNOSIS — F312 Bipolar disorder, current episode manic severe with psychotic features: Secondary | ICD-10-CM | POA: Diagnosis not present

## 2022-04-27 DIAGNOSIS — F312 Bipolar disorder, current episode manic severe with psychotic features: Secondary | ICD-10-CM | POA: Diagnosis not present

## 2022-04-30 ENCOUNTER — Emergency Department (HOSPITAL_BASED_OUTPATIENT_CLINIC_OR_DEPARTMENT_OTHER)
Admission: EM | Admit: 2022-04-30 | Discharge: 2022-05-01 | Disposition: A | Discharge status: Home / Self Care | Payer: PPO | Attending: Emergency Medicine | Admitting: Emergency Medicine

## 2022-04-30 ENCOUNTER — Emergency Department (HOSPITAL_BASED_OUTPATIENT_CLINIC_OR_DEPARTMENT_OTHER): Payer: PPO

## 2022-04-30 ENCOUNTER — Other Ambulatory Visit: Payer: Self-pay

## 2022-04-30 DIAGNOSIS — R1084 Generalized abdominal pain: Secondary | ICD-10-CM | POA: Diagnosis not present

## 2022-04-30 DIAGNOSIS — K219 Gastro-esophageal reflux disease without esophagitis: Secondary | ICD-10-CM | POA: Diagnosis not present

## 2022-04-30 DIAGNOSIS — R197 Diarrhea, unspecified: Secondary | ICD-10-CM | POA: Diagnosis not present

## 2022-04-30 DIAGNOSIS — R109 Unspecified abdominal pain: Secondary | ICD-10-CM | POA: Diagnosis not present

## 2022-04-30 LAB — URINALYSIS, ROUTINE W REFLEX MICROSCOPIC
Bilirubin Urine: NEGATIVE
Glucose, UA: NEGATIVE mg/dL
Hgb urine dipstick: NEGATIVE
Ketones, ur: NEGATIVE mg/dL
Leukocytes,Ua: NEGATIVE
Nitrite: NEGATIVE
Specific Gravity, Urine: 1.025 (ref 1.005–1.030)
pH: 6.5 (ref 5.0–8.0)

## 2022-04-30 LAB — COMPREHENSIVE METABOLIC PANEL
ALT: 18 U/L (ref 0–44)
AST: 15 U/L (ref 15–41)
Albumin: 4.6 g/dL (ref 3.5–5.0)
Alkaline Phosphatase: 79 U/L (ref 38–126)
Anion gap: 10 (ref 5–15)
BUN: 17 mg/dL (ref 6–20)
CO2: 22 mmol/L (ref 22–32)
Calcium: 9.4 mg/dL (ref 8.9–10.3)
Chloride: 105 mmol/L (ref 98–111)
Creatinine, Ser: 0.7 mg/dL (ref 0.44–1.00)
GFR, Estimated: >60 mL/min (ref >60)
Glucose, Bld: 95 mg/dL (ref 70–99)
Potassium: 4 mmol/L (ref 3.5–5.1)
Sodium: 137 mmol/L (ref 135–145)
Total Bilirubin: 0.5 mg/dL (ref 0.3–1.2)
Total Protein: 7.1 g/dL (ref 6.5–8.1)

## 2022-04-30 LAB — CBC
HCT: 42.7 % (ref 36.0–46.0)
Hemoglobin: 14.4 g/dL (ref 12.0–15.0)
MCH: 31.4 pg (ref 26.0–34.0)
MCHC: 33.7 g/dL (ref 30.0–36.0)
MCV: 93 fL (ref 80.0–100.0)
Platelets: 221 10*3/uL (ref 150–400)
RBC: 4.59 MIL/uL (ref 3.87–5.11)
RDW: 12 % (ref 11.5–15.5)
WBC: 8 10*3/uL (ref 4.0–10.5)
nRBC: 0 % (ref 0.0–0.2)

## 2022-04-30 LAB — LIPASE, BLOOD: Lipase: 16 U/L (ref 11–51)

## 2022-04-30 LAB — PREGNANCY, URINE: Preg Test, Ur: NEGATIVE

## 2022-04-30 MED ORDER — HYDROMORPHONE HCL 1 MG/ML IJ SOLN
1.0000 mg | Freq: Once | INTRAMUSCULAR | Status: AC
  Administered 2022-04-30: 1 mg via INTRAVENOUS
  Filled 2022-04-30: qty 1

## 2022-04-30 MED ORDER — SODIUM CHLORIDE 0.9 % IV SOLN
12.5000 mg | Freq: Four times a day (QID) | INTRAVENOUS | Status: DC | PRN
  Administered 2022-04-30: 12.5 mg via INTRAVENOUS
  Filled 2022-04-30: qty 0.5

## 2022-04-30 MED ORDER — IOHEXOL 300 MG/ML  SOLN
100.0000 mL | Freq: Once | INTRAMUSCULAR | Status: AC | PRN
  Administered 2022-04-30: 100 mL via INTRAVENOUS

## 2022-04-30 MED ORDER — PROMETHAZINE HCL 25 MG/ML IJ SOLN
INTRAMUSCULAR | Status: AC
  Filled 2022-04-30: qty 1

## 2022-04-30 MED ORDER — SODIUM CHLORIDE 0.9 % IV BOLUS
1000.0000 mL | Freq: Once | INTRAVENOUS | Status: AC
  Administered 2022-04-30: 1000 mL via INTRAVENOUS

## 2022-04-30 NOTE — ED Triage Notes (Signed)
Patient arrives with complaint several episodes of diarrhea this week. Patient reports that today she has several episodes of diarrhea and took immodium prior to arrival. Also reports abdominal pain and bloating. Had Edometriosis in May.     Rates abdominal pain 7/10.

## 2022-04-30 NOTE — ED Provider Notes (Signed)
Gina [name]   CSN: [id number] Arrival date & time: 04/30/22  1832     History {Add pertinent medical, surgical, social history, OB history to HPI:1} Chief Complaint  Patient presents with   Diarrhea   Dizziness   Abdominal Pain    Gina [name] is a 46 y.o. female.  Patient is a 46 year old female with past medical history of endometriosis status post ablation May 10.  Patient also has history of panic disorder, dysthymic disorder, depression, GERD, irritable bowel.  Patient presenting today with complaints of abdominal cramping and diarrhea.  This has been worse since she had her surgery performed May 10.  She describes generalized abdominal cramping and episodes of diarrhea every day.  She denies any bloody stool.  She denies to me that she is having any nausea or vomiting, but tells me that she "cannot keep anything down".  The history is provided by the patient.       Home Medications Prior to Admission medications   Medication Sig Start Date End Date Taking? Authorizing Provider  clonazePAM (KLONOPIN) 0.5 MG tablet Take 0.5 mg by mouth 2 (two) times daily.  08/30/18   [provider]  cyclobenzaprine (FLEXERIL) 10 MG tablet Take 10 mg by mouth 3 (three) times daily as needed for muscle spasms.  01/19/19   [provider]  escitalopram (LEXAPRO) 20 MG tablet Take 30 mg by mouth at bedtime. 12/20/18   [provider]  lurasidone (LATUDA) 80 MG TABS tablet Take 80 mg by mouth at bedtime.     [provider]  methocarbamol (ROBAXIN) 500 MG tablet Take 1 tablet (500 mg total) by mouth 2 (two) times daily. 01/06/21   Gareth Morgan, MD  naloxegol oxalate (MOVANTIK) 25 MG TABS tablet Take 25 mg by mouth daily as needed (constipation).     [provider]  oxyCODONE-acetaminophen (PERCOCET) 7.5-325 MG tablet Take 1 tablet by mouth 4 (four) times daily.    [provider]  promethazine  (PHENERGAN) 25 MG suppository Place 1 suppository (25 mg total) rectally every 6 (six) hours as needed for nausea or vomiting. 03/23/21   Horton, Barbette Hair, MD  promethazine (PHENERGAN) 25 MG tablet Take 1 tablet (25 mg total) by mouth every 6 (six) hours as needed for nausea or vomiting. 01/06/21   Gareth Morgan, MD  promethazine-dextromethorphan (PROMETHAZINE-DM) 6.25-15 MG/5ML syrup Take 5 mLs by mouth 4 (four) times daily as needed for cough. 05/15/20   Daleen Squibb, MD  REXULTI 1 MG TABS Take 1 tablet by mouth daily.  01/19/19   [provider]      Allergies    Lamictal [lamotrigine], Morphine and related, Nicotine, Zofran, Chantix [varenicline tartrate], Codeine, Haldol [haloperidol], Ibuprofen, Lidoderm [lidocaine], Metaxalone, Sulfa antibiotics, Sulfasalazine, Toradol [ketorolac tromethamine], Duloxetine, Gabapentin, Ingrezza [valbenazine tosylate], Methadone, Pregabalin, Zolpidem, Amoxicillin, Aripiprazole, Penicillins, and Valbenazine    Review of Systems   Review of Systems  All other systems reviewed and are negative.   Physical Exam Updated Vital Signs BP 100/76 (BP Location: Right Arm)   Pulse 89   Temp 98.3 F (36.8 C) (Oral)   Resp 16   LMP 01/01/2004   SpO2 99%  Physical Exam Vitals and nursing [name] reviewed.  Constitutional:      General: She is not in acute distress.    Appearance: She is well-developed. She is not diaphoretic.  HENT:     Head: Normocephalic and atraumatic.  Cardiovascular:     Rate  and Rhythm: Normal rate and regular rhythm.     Heart sounds: No murmur heard.    No friction rub. No gallop.  Pulmonary:     Effort: Pulmonary effort is normal. No respiratory distress.     Breath sounds: Normal breath sounds. No wheezing.  Abdominal:     General: Bowel sounds are normal. There is no distension.     Palpations: Abdomen is soft.     Tenderness: There is generalized abdominal tenderness. There is no right CVA tenderness, left CVA  tenderness, guarding or rebound.  Musculoskeletal:        General: Normal range of motion.     Cervical back: Normal range of motion and neck supple.  Skin:    General: Skin is warm and dry.  Neurological:     General: No focal deficit present.     Mental Status: She is alert and oriented to person, place, and time.     ED Results / Procedures / Treatments   Labs (all labs ordered are listed, but only abnormal results are displayed) Labs Reviewed  URINALYSIS, ROUTINE W REFLEX MICROSCOPIC - Abnormal; Notable for the following components:      Result Value   Protein, ur TRACE (*)    All other components within normal limits  LIPASE, BLOOD  COMPREHENSIVE METABOLIC PANEL  CBC  PREGNANCY, URINE    EKG None  Radiology No results found.  Procedures Procedures  {Document cardiac monitor, telemetry assessment procedure when appropriate:1}  Medications Ordered in ED Medications  sodium chloride 0.9 % bolus 1,000 mL (has no administration in time range)  HYDROmorphone (DILAUDID) injection 1 mg (has no administration in time range)  promethazine (PHENERGAN) 12.5 mg in sodium chloride 0.9 % 50 mL IVPB (has no administration in time range)    ED Course/ Medical Decision Making/ A&P                           Medical Decision Making Amount and/or Complexity of Data Reviewed Labs: ordered. Radiology: ordered.  Risk Prescription drug management.   ***  {Document critical care time when appropriate:1} {Document review of labs and clinical decision tools ie heart score, Chads2Vasc2 etc:1}  {Document your independent review of radiology images, and any outside records:1} {Document your discussion with family members, caretakers, and with consultants:1} {Document social determinants of health affecting pt's care:1} {Document your decision making why or why not admission, treatments were needed:1} Final Clinical Impression(s) / ED Diagnoses Final diagnoses:  None    Rx /  DC Orders ED Discharge Orders     None

## 2022-05-01 MED ORDER — DIPHENOXYLATE-ATROPINE 2.5-0.025 MG PO TABS: 1.0000 | ORAL_TABLET | Freq: Four times a day (QID) | ORAL | 0 refills | Status: DC | PRN

## 2022-05-01 MED ORDER — LORAZEPAM 1 MG PO TABS
1.0000 mg | ORAL_TABLET | Freq: Once | ORAL | Status: AC
  Administered 2022-05-01: 1 mg via ORAL
  Filled 2022-05-01: qty 1

## 2022-05-01 NOTE — Discharge Instructions (Signed)
Begin taking Lomotil as prescribed as needed for diarrhea.  Follow-up with gastroenterology next week, and return to the ER if symptoms significantly worsen or change.

## 2022-05-01 NOTE — ED Notes (Signed)
Reviewed AVS/discharge instruction with patient. Time allotted for and all questions answered. Patient is agreeable for d/c and escorted to ed exit by staff.  

## 2022-05-06 DIAGNOSIS — K219 Gastro-esophageal reflux disease without esophagitis: Secondary | ICD-10-CM | POA: Diagnosis not present

## 2022-05-06 DIAGNOSIS — R1030 Lower abdominal pain, unspecified: Secondary | ICD-10-CM | POA: Diagnosis not present

## 2022-05-06 DIAGNOSIS — R197 Diarrhea, unspecified: Secondary | ICD-10-CM | POA: Diagnosis not present

## 2022-05-06 DIAGNOSIS — N809 Endometriosis, unspecified: Secondary | ICD-10-CM | POA: Diagnosis not present

## 2022-05-11 DIAGNOSIS — R197 Diarrhea, unspecified: Secondary | ICD-10-CM | POA: Diagnosis not present

## 2022-05-11 DIAGNOSIS — R1030 Lower abdominal pain, unspecified: Secondary | ICD-10-CM | POA: Diagnosis not present

## 2022-05-17 DIAGNOSIS — R140 Abdominal distension (gaseous): Secondary | ICD-10-CM | POA: Diagnosis not present

## 2022-05-17 DIAGNOSIS — R1084 Generalized abdominal pain: Secondary | ICD-10-CM | POA: Diagnosis not present

## 2022-05-17 DIAGNOSIS — K582 Mixed irritable bowel syndrome: Secondary | ICD-10-CM | POA: Diagnosis not present

## 2022-05-17 DIAGNOSIS — Z9049 Acquired absence of other specified parts of digestive tract: Secondary | ICD-10-CM | POA: Diagnosis not present

## 2022-05-17 DIAGNOSIS — Z79899 Other long term (current) drug therapy: Secondary | ICD-10-CM | POA: Diagnosis not present

## 2022-05-17 DIAGNOSIS — R197 Diarrhea, unspecified: Secondary | ICD-10-CM | POA: Diagnosis not present

## 2022-05-17 DIAGNOSIS — K219 Gastro-esophageal reflux disease without esophagitis: Secondary | ICD-10-CM | POA: Diagnosis not present

## 2022-06-01 DIAGNOSIS — R1084 Generalized abdominal pain: Secondary | ICD-10-CM | POA: Diagnosis not present

## 2022-06-01 DIAGNOSIS — R197 Diarrhea, unspecified: Secondary | ICD-10-CM | POA: Diagnosis not present

## 2022-06-04 DIAGNOSIS — K621 Rectal polyp: Secondary | ICD-10-CM | POA: Diagnosis not present

## 2022-06-04 DIAGNOSIS — D128 Benign neoplasm of rectum: Secondary | ICD-10-CM | POA: Diagnosis not present

## 2022-06-04 DIAGNOSIS — K3189 Other diseases of stomach and duodenum: Secondary | ICD-10-CM | POA: Diagnosis not present

## 2022-06-04 DIAGNOSIS — R197 Diarrhea, unspecified: Secondary | ICD-10-CM | POA: Diagnosis not present

## 2022-06-04 DIAGNOSIS — K648 Other hemorrhoids: Secondary | ICD-10-CM | POA: Diagnosis not present

## 2022-06-04 DIAGNOSIS — R1084 Generalized abdominal pain: Secondary | ICD-10-CM | POA: Diagnosis not present

## 2022-06-04 DIAGNOSIS — K219 Gastro-esophageal reflux disease without esophagitis: Secondary | ICD-10-CM | POA: Diagnosis not present

## 2022-06-05 ENCOUNTER — Encounter (HOSPITAL_BASED_OUTPATIENT_CLINIC_OR_DEPARTMENT_OTHER): Payer: Self-pay

## 2022-06-05 ENCOUNTER — Emergency Department (HOSPITAL_BASED_OUTPATIENT_CLINIC_OR_DEPARTMENT_OTHER): Payer: PPO

## 2022-06-05 ENCOUNTER — Emergency Department (HOSPITAL_BASED_OUTPATIENT_CLINIC_OR_DEPARTMENT_OTHER)
Admission: EM | Admit: 2022-06-05 | Discharge: 2022-06-06 | Disposition: A | Discharge status: Home / Self Care | Payer: PPO | Attending: Emergency Medicine | Admitting: Emergency Medicine

## 2022-06-05 ENCOUNTER — Other Ambulatory Visit: Payer: Self-pay

## 2022-06-05 DIAGNOSIS — R140 Abdominal distension (gaseous): Secondary | ICD-10-CM

## 2022-06-05 DIAGNOSIS — I2510 Atherosclerotic heart disease of native coronary artery without angina pectoris: Secondary | ICD-10-CM | POA: Diagnosis not present

## 2022-06-05 DIAGNOSIS — R109 Unspecified abdominal pain: Secondary | ICD-10-CM | POA: Insufficient documentation

## 2022-06-05 DIAGNOSIS — N9489 Other specified conditions associated with female genital organs and menstrual cycle: Secondary | ICD-10-CM | POA: Insufficient documentation

## 2022-06-05 DIAGNOSIS — R110 Nausea: Secondary | ICD-10-CM | POA: Diagnosis not present

## 2022-06-05 LAB — COMPREHENSIVE METABOLIC PANEL
ALT: 14 U/L (ref 0–44)
AST: 15 U/L (ref 15–41)
Albumin: 4.5 g/dL (ref 3.5–5.0)
Alkaline Phosphatase: 84 U/L (ref 38–126)
Anion gap: 9 (ref 5–15)
BUN: 11 mg/dL (ref 6–20)
CO2: 23 mmol/L (ref 22–32)
Calcium: 9 mg/dL (ref 8.9–10.3)
Chloride: 105 mmol/L (ref 98–111)
Creatinine, Ser: 0.62 mg/dL (ref 0.44–1.00)
GFR, Estimated: >60 mL/min (ref >60)
Glucose, Bld: 94 mg/dL (ref 70–99)
Potassium: 4 mmol/L (ref 3.5–5.1)
Sodium: 137 mmol/L (ref 135–145)
Total Bilirubin: 0.5 mg/dL (ref 0.3–1.2)
Total Protein: 6.8 g/dL (ref 6.5–8.1)

## 2022-06-05 LAB — URINALYSIS, ROUTINE W REFLEX MICROSCOPIC
Bilirubin Urine: NEGATIVE
Glucose, UA: NEGATIVE mg/dL
Hgb urine dipstick: NEGATIVE
Ketones, ur: NEGATIVE mg/dL
Leukocytes,Ua: NEGATIVE
Nitrite: NEGATIVE
Protein, ur: NEGATIVE mg/dL
Specific Gravity, Urine: 1.011 (ref 1.005–1.030)
pH: 6 (ref 5.0–8.0)

## 2022-06-05 LAB — CBC
HCT: 40.3 % (ref 36.0–46.0)
Hemoglobin: 14.3 g/dL (ref 12.0–15.0)
MCH: 32.6 pg (ref 26.0–34.0)
MCHC: 35.5 g/dL (ref 30.0–36.0)
MCV: 91.8 fL (ref 80.0–100.0)
Platelets: 213 10*3/uL (ref 150–400)
RBC: 4.39 MIL/uL (ref 3.87–5.11)
RDW: 12.2 % (ref 11.5–15.5)
WBC: 9.7 10*3/uL (ref 4.0–10.5)
nRBC: 0 % (ref 0.0–0.2)

## 2022-06-05 LAB — HCG, SERUM, QUALITATIVE: Preg, Serum: NEGATIVE

## 2022-06-05 LAB — LIPASE, BLOOD: Lipase: 18 U/L (ref 11–51)

## 2022-06-05 MED ORDER — ALUM & MAG HYDROXIDE-SIMETH 200-200-20 MG/5ML PO SUSP
30.0000 mL | Freq: Once | ORAL | Status: AC
  Administered 2022-06-05: 30 mL via ORAL
  Filled 2022-06-05: qty 30

## 2022-06-05 MED ORDER — ACETAMINOPHEN 325 MG PO TABS: 650.0000 mg | ORAL_TABLET | Freq: Four times a day (QID) | ORAL | 0 refills | Status: DC | PRN

## 2022-06-05 MED ORDER — SODIUM CHLORIDE 0.9 % IV BOLUS
1000.0000 mL | Freq: Once | INTRAVENOUS | Status: AC
  Administered 2022-06-05: 1000 mL via INTRAVENOUS

## 2022-06-05 MED ORDER — PROMETHAZINE HCL 25 MG PO TABS: 25.0000 mg | ORAL_TABLET | Freq: Four times a day (QID) | ORAL | 0 refills | Status: DC | PRN

## 2022-06-05 MED ORDER — PROMETHAZINE HCL 25 MG PO TABS
25.0000 mg | ORAL_TABLET | Freq: Once | ORAL | Status: AC
  Administered 2022-06-05: 25 mg via ORAL
  Filled 2022-06-05: qty 1

## 2022-06-05 MED ORDER — METOCLOPRAMIDE HCL 5 MG/ML IJ SOLN
5.0000 mg | Freq: Once | INTRAMUSCULAR | Status: AC
  Administered 2022-06-05: 5 mg via INTRAVENOUS
  Filled 2022-06-05: qty 2

## 2022-06-05 MED ORDER — LIDOCAINE VISCOUS HCL 2 % MT SOLN
15.0000 mL | Freq: Once | OROMUCOSAL | Status: AC
  Administered 2022-06-05: 15 mL via ORAL
  Filled 2022-06-05: qty 15

## 2022-06-05 MED ORDER — IOHEXOL 300 MG/ML  SOLN
100.0000 mL | Freq: Once | INTRAMUSCULAR | Status: AC | PRN
  Administered 2022-06-05: 80 mL via INTRAVENOUS

## 2022-06-05 MED ORDER — SIMETHICONE 80 MG PO CHEW: 80.0000 mg | CHEWABLE_TABLET | Freq: Four times a day (QID) | ORAL | 0 refills | Status: DC | PRN

## 2022-06-05 MED ORDER — DIPHENHYDRAMINE HCL 50 MG/ML IJ SOLN
12.5000 mg | Freq: Once | INTRAMUSCULAR | Status: AC
  Administered 2022-06-05: 12.5 mg via INTRAVENOUS
  Filled 2022-06-05: qty 1

## 2022-06-05 MED ORDER — FAMOTIDINE IN NACL 20-0.9 MG/50ML-% IV SOLN
20.0000 mg | Freq: Once | INTRAVENOUS | Status: AC
Start: 2022-06-05 — End: 2022-06-05
  Administered 2022-06-05: 20 mg via INTRAVENOUS
  Filled 2022-06-05: qty 50

## 2022-06-05 MED ORDER — HYDROMORPHONE HCL 1 MG/ML IJ SOLN
1.0000 mg | Freq: Once | INTRAMUSCULAR | Status: AC
  Administered 2022-06-05: 1 mg via INTRAVENOUS
  Filled 2022-06-05: qty 1

## 2022-06-05 NOTE — ED Provider Notes (Signed)
[place] EMERGENCY DEPT Provider Note   CSN: [id number] Arrival date & time: 06/05/22  1812     History  Chief Complaint  Patient presents with   Abdominal Pain    Gina [name] is a 46 y.o. female.  Patient as above with significant medical history as below, including anxiety, CAD, colon polyp, HLD, nicotine dependence who presents to the ED with complaint of bloating.  Patient reports she had colonoscopy and endoscopy done yesterday at [facility].  She has had increased bloating, gas sensation to her abdomen.  She is passed flatulence x2.  No bowel movement.  P.o. intake makes the bloating sensation worse.  Tried Gas-X without significant relief of her symptoms.  This was her first colonoscopy.  Reports she had multiple biopsies completed and a polyp removed.  She had some scant bleeding from her rectum but it has resolved.    Past Medical History:  Diagnosis Date   ABDOMINAL WALL HERNIA 02/27/2010   Abnormal Pap smear    ALLERGIC RHINITIS 06/02/2007   ANOREXIA, CHRONIC 08/07/2008   ANXIETY 09/15/2010   Arthritis    BACK PAIN, THORACIC REGION 07/07/2007   Bipolar disorder ([place])    CERVICAL RADICULOPATHY 12/11/2008   Condyloma acuminatum 04/23/2009   CONSTIPATION 09/15/2010   DYSPHAGIA UNSPECIFIED 09/09/2009   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   Eating disorder    ENDOMETRIOSIS 12/16/2009   FATIGUE 06/11/2010   Fibromyalgia    GERD 10/23/2009   Heart palpitations    HERPES SIMPLEX INFECTION 06/11/2010   LENTIGO 04/23/2009   LUNG NODULE 06/17/2010   Narcotic abuse, continuous ([address])    Ovarian cyst    Palpitations 10/23/2009   Panic disorder    Renal disorder    Rheumatoid arthritis(714.0)    Stricture and stenosis of esophagus 05/13/2010   TOBACCO ABUSE 08/07/2008   Urinary tract infection    UTI (urinary tract infection)     Past Surgical History:  Procedure Laterality Date   ABDOMINAL SURGERY     ABDOMINAL WALL MESH  REMOVAL     ABLATION ON  ENDOMETRIOSIS     CESAREAN SECTION     x2    DILATION AND CURETTAGE OF UTERUS     x1    ENDOMETRIAL ABLATION     esophageal dilatation x 4     GUM SURGERY     HERNIA REPAIR     X3   PARTIAL HYSTERECTOMY       The history is provided by the patient. No language interpreter was used.  Abdominal Pain      Home Medications Prior to Admission medications   Medication Sig Start Date End Date Taking? Authorizing Provider  acetaminophen (TYLENOL) 325 MG tablet Take 2 tablets (650 mg total) by mouth every 6 (six) hours as needed. 06/05/22  Yes Jeanell Sparrow, DO  promethazine (PHENERGAN) 25 MG tablet Take 1 tablet (25 mg total) by mouth every 6 (six) hours as needed for nausea or vomiting. 06/05/22  Yes Wynona Dove A, DO  simethicone (GAS-X) 80 MG chewable tablet Chew 1 tablet (80 mg total) by mouth every 6 (six) hours as needed for flatulence. 06/05/22  Yes Wynona Dove A, DO  clonazePAM (KLONOPIN) 0.5 MG tablet Take 0.5 mg by mouth 2 (two) times daily.  08/30/18   [provider]  cyclobenzaprine (FLEXERIL) 10 MG tablet Take 10 mg by mouth 3 (three) times daily as needed for muscle spasms.  01/19/19   [provider]  diphenoxylate-atropine (LOMOTIL) 2.5-0.025 MG  tablet Take 1 tablet by mouth 4 (four) times daily as needed for diarrhea or loose stools. 05/01/22   Veryl Speak, MD  escitalopram (LEXAPRO) 20 MG tablet Take 30 mg by mouth at bedtime. 12/20/18   [provider]  lurasidone (LATUDA) 80 MG TABS tablet Take 80 mg by mouth at bedtime.     [provider]  methocarbamol (ROBAXIN) 500 MG tablet Take 1 tablet (500 mg total) by mouth 2 (two) times daily. 01/06/21   Gareth Morgan, MD  naloxegol oxalate (MOVANTIK) 25 MG TABS tablet Take 25 mg by mouth daily as needed (constipation).     [provider]  oxyCODONE-acetaminophen (PERCOCET) 7.5-325 MG tablet Take 1 tablet by mouth 4 (four) times daily.    [provider]  promethazine  (PHENERGAN) 25 MG suppository Place 1 suppository (25 mg total) rectally every 6 (six) hours as needed for nausea or vomiting. 03/23/21   Horton, Barbette Hair, MD  promethazine (PHENERGAN) 25 MG tablet Take 1 tablet (25 mg total) by mouth every 6 (six) hours as needed for nausea or vomiting. 01/06/21   Gareth Morgan, MD  promethazine-dextromethorphan (PROMETHAZINE-DM) 6.25-15 MG/5ML syrup Take 5 mLs by mouth 4 (four) times daily as needed for cough. 05/15/20   Daleen Squibb, MD  REXULTI 1 MG TABS Take 1 tablet by mouth daily.  01/19/19   [provider]      Allergies    Lamictal [lamotrigine], Morphine and related, Nicotine, Zofran, Chantix [varenicline tartrate], Codeine, Haldol [haloperidol], Ibuprofen, Lidoderm [lidocaine], Metaxalone, Sulfa antibiotics, Sulfasalazine, Toradol [ketorolac tromethamine], Duloxetine, Gabapentin, Ingrezza [valbenazine tosylate], Methadone, Pregabalin, Zolpidem, Amoxicillin, Aripiprazole, Penicillins, and Valbenazine    Review of Systems   Review of Systems  Gastrointestinal:  Positive for abdominal pain.    Physical Exam Updated Vital Signs BP 105/79   Pulse 75   Temp 99 F (37.2 C) (Oral)   Resp 16   LMP 01/01/2004   SpO2 99%  Physical Exam  ED Results / Procedures / Treatments   Labs (all labs ordered are listed, but only abnormal results are displayed) Labs Reviewed  LIPASE, BLOOD  COMPREHENSIVE METABOLIC PANEL  CBC  URINALYSIS, ROUTINE W REFLEX MICROSCOPIC  HCG, SERUM, QUALITATIVE    EKG None  Radiology CT ABDOMEN PELVIS W CONTRAST  Result Date: 06/05/2022 CLINICAL DATA:  Abdominal pain, nausea.  Recent endoscopy. EXAM: CT ABDOMEN AND PELVIS WITH CONTRAST TECHNIQUE: Multidetector CT imaging of the abdomen and pelvis was performed using the standard protocol following bolus administration of intravenous contrast. RADIATION DOSE REDUCTION: This exam was performed according to the departmental dose-optimization program which  includes automated exposure control, adjustment of the mA and/or kV according to patient size and/or use of iterative reconstruction technique. CONTRAST:  58m OMNIPAQUE IOHEXOL 300 MG/ML  SOLN COMPARISON:  04/30/2022. FINDINGS: Lower chest: No acute abnormality. Hepatobiliary: No focal liver abnormality is seen. Status post cholecystectomy. No biliary dilatation. Pancreas: Unremarkable. No pancreatic ductal dilatation or surrounding inflammatory changes. Spleen: Normal in size without focal abnormality. Adrenals/Urinary Tract: No adrenal nodule or mass. The kidneys enhance symmetrically. A subcentimeter hypodensity is noted in the right kidney which is too small to further characterize. No renal calculus or hydronephrosis. The bladder is unremarkable. Stomach/Bowel: Stomach is within normal limits. Appendix appears normal. No evidence of bowel wall thickening, distention, or inflammatory changes. No free air or pneumatosis. Vascular/Lymphatic: No significant vascular findings are present. No enlarged abdominal or pelvic lymph nodes. Reproductive: The uterus is surgically absent.  No adnexal mass.  Other: No abdominopelvic ascites. Musculoskeletal: No acute osseous abnormality. IMPRESSION: No acute intra-abdominal process. Electronically Signed   By: Brett Fairy M.D.   On: 06/05/2022 21:34    Procedures Procedures    Medications Ordered in ED Medications  sodium chloride 0.9 % bolus 1,000 mL (0 mLs Intravenous Stopped 06/05/22 2302)  HYDROmorphone (DILAUDID) injection 1 mg (1 mg Intravenous Given 06/05/22 2043)  metoCLOPramide (REGLAN) injection 5 mg (5 mg Intravenous Given 06/05/22 2043)  diphenhydrAMINE (BENADRYL) injection 12.5 mg (12.5 mg Intravenous Given 06/05/22 2043)  famotidine (PEPCID) IVPB 20 mg premix (0 mg Intravenous Stopped 06/05/22 2113)  iohexol (OMNIPAQUE) 300 MG/ML solution 100 mL (80 mLs Intravenous Contrast Given 06/05/22 2106)  alum & mag hydroxide-simeth (MAALOX/MYLANTA) 200-200-20 MG/5ML  suspension 30 mL (30 mLs Oral Given 06/05/22 2248)    And  lidocaine (XYLOCAINE) 2 % viscous mouth solution 15 mL (15 mLs Oral Given 06/05/22 2248)  promethazine (PHENERGAN) tablet 25 mg (25 mg Oral Given 06/05/22 2357)    ED Course/ Medical Decision Making/ A&P                           Medical Decision Making Amount and/or Complexity of Data Reviewed Labs: ordered. Radiology: ordered.  Risk OTC drugs. Prescription drug management.    CC: abd pain bloating  This patient presents to the Emergency Department for the above complaint. This involves an extensive number of treatment options and is a complaint that carries with it a high risk of complications and morbidity. Vital signs were reviewed. Serious etiologies considered.  Differential diagnosis includes but is not exclusive to acute cholecystitis, intrathoracic causes for epigastric abdominal pain, gastritis, duodenitis, pancreatitis, small bowel or large bowel obstruction, abdominal aortic aneurysm, hernia, gastritis, etc.  Record review:  Previous records obtained and reviewed prior ed visits, prior labs/imaging  Additional history obtained from spouse  Medical and surgical history as noted above.   Work up as above, notable for:  Labs & imaging results that were available during my care of the patient were visualized by me and considered in my medical decision making.  Physical exam as above.   I ordered imaging studies which included ct a/p. I visualized the imaging, interpreted images, and I agree with radiologist interpretation. No acute process  Cardiac monitoring reviewed and interpreted personally which shows NSR  Labs reviewed and stable  Management: GI cocktail, IV fluids, antiemetic  ED Course:     Reassessment:  Symptoms improved, she is tolerating p.o. intake.  Abdomen is soft, not peritoneal.  She is HDS.  Admission was considered.   Favor bloating sensation likely secondary to recent colonoscopy.   No evidence of obstruction or perforation.  Discussed supportive care at home.  Gas-X, antiemetic.  Follow-up with GI Monday.  Return if worse  The patient's overall condition has improved, the patient presents with abdominal pain without signs of peritonitis, or other life-threatening serious etiology. The patient understands that at this time there is no evidence for a more malignant underlying process, but the patient also understands that early in the process of an illness, an emergency department workup can be falsely reassuring. Detailed discussions were had with the patient regarding current findings, and need for close f/u with PCP/GI. The patient appears stable for discharge and has been instructed to return immediately if the symptoms worsen in any way for re-evaluation. Patient verbalized understanding and is in agreement with current care plan.  All questions answered prior to  discharge.             Social determinants of health include -  Social History   Socioeconomic History   Marital status: Married    Spouse name: Not on file   Number of children: Not on file   Years of education: Not on file   Highest education level: Not on file  Occupational History   Not on file  Tobacco Use   Smoking status: Every Day    Packs/day: 0.20    Types: Cigarettes   Smokeless tobacco: Never  Vaping Use   Vaping Use: Never used  Substance and Sexual Activity   Alcohol use: No   Drug use: No   Sexual activity: Yes    Birth control/protection: Surgical  Other Topics Concern   Not on file  Social History Narrative   Not on file   Social Determinants of Health   Financial Resource Strain: Not on file  Food Insecurity: Not on file  Transportation Needs: Not on file  Physical Activity: Not on file  Stress: Not on file  Social Connections: Not on file  Intimate Partner Violence: Not on file      This chart was dictated using voice recognition software.  Despite best  efforts to proofread,  errors can occur which can change the documentation meaning.         Final Clinical Impression(s) / ED Diagnoses Final diagnoses:  Abdominal bloating    Rx / DC Orders ED Discharge Orders          Ordered    promethazine (PHENERGAN) 25 MG tablet  Every 6 hours PRN        06/05/22 2328    acetaminophen (TYLENOL) 325 MG tablet  Every 6 hours PRN        06/05/22 2328    simethicone (GAS-X) 80 MG chewable tablet  Every 6 hours PRN        06/05/22 2329              Wynona Dove A, DO 06/06/22 0037

## 2022-06-05 NOTE — ED Triage Notes (Signed)
Pt presents POV from home. Pt had a colonoscopy and endoscopy done yesterday at 2pm. Pt reports a BM since procedure, has had gas two times since procedure. Pt reports increase belching and abd distention. Called her gastroenterologist, they sent her here for further evaluation.  Pt reports eating a cup of frozen yogurt. Pt reports she ate a solid meal last night after her coloscopy.

## 2022-06-05 NOTE — Discharge Instructions (Addendum)
It was a pleasure caring for you today in the emergency department. ° °Please return to the emergency department for any worsening or worrisome symptoms. ° ° °

## 2022-06-18 DIAGNOSIS — R197 Diarrhea, unspecified: Secondary | ICD-10-CM | POA: Diagnosis not present

## 2022-06-25 DIAGNOSIS — R197 Diarrhea, unspecified: Secondary | ICD-10-CM | POA: Diagnosis not present

## 2022-07-01 DIAGNOSIS — F419 Anxiety disorder, unspecified: Secondary | ICD-10-CM | POA: Diagnosis not present

## 2022-07-01 DIAGNOSIS — F17200 Nicotine dependence, unspecified, uncomplicated: Secondary | ICD-10-CM | POA: Diagnosis not present

## 2022-07-01 DIAGNOSIS — R197 Diarrhea, unspecified: Secondary | ICD-10-CM | POA: Diagnosis not present

## 2022-07-01 DIAGNOSIS — R1310 Dysphagia, unspecified: Secondary | ICD-10-CM | POA: Diagnosis not present

## 2022-07-01 DIAGNOSIS — F319 Bipolar disorder, unspecified: Secondary | ICD-10-CM | POA: Diagnosis not present

## 2022-07-01 DIAGNOSIS — R1084 Generalized abdominal pain: Secondary | ICD-10-CM | POA: Diagnosis not present

## 2022-07-22 DIAGNOSIS — B9689 Other specified bacterial agents as the cause of diseases classified elsewhere: Secondary | ICD-10-CM | POA: Diagnosis not present

## 2022-07-22 DIAGNOSIS — R112 Nausea with vomiting, unspecified: Secondary | ICD-10-CM | POA: Diagnosis not present

## 2022-07-22 DIAGNOSIS — R197 Diarrhea, unspecified: Secondary | ICD-10-CM | POA: Diagnosis not present

## 2022-07-22 DIAGNOSIS — R635 Abnormal weight gain: Secondary | ICD-10-CM | POA: Diagnosis not present

## 2022-07-22 DIAGNOSIS — K639 Disease of intestine, unspecified: Secondary | ICD-10-CM | POA: Diagnosis not present

## 2022-07-22 DIAGNOSIS — R140 Abdominal distension (gaseous): Secondary | ICD-10-CM | POA: Diagnosis not present

## 2022-07-22 DIAGNOSIS — R142 Eructation: Secondary | ICD-10-CM | POA: Diagnosis not present

## 2022-07-22 DIAGNOSIS — R1084 Generalized abdominal pain: Secondary | ICD-10-CM | POA: Diagnosis not present

## 2022-08-05 DIAGNOSIS — B001 Herpesviral vesicular dermatitis: Secondary | ICD-10-CM | POA: Diagnosis not present

## 2022-08-05 DIAGNOSIS — R197 Diarrhea, unspecified: Secondary | ICD-10-CM | POA: Diagnosis not present

## 2022-08-05 DIAGNOSIS — K648 Other hemorrhoids: Secondary | ICD-10-CM | POA: Diagnosis not present

## 2022-08-05 DIAGNOSIS — L609 Nail disorder, unspecified: Secondary | ICD-10-CM | POA: Diagnosis not present

## 2022-08-18 DIAGNOSIS — M62838 Other muscle spasm: Secondary | ICD-10-CM | POA: Diagnosis not present

## 2022-08-18 DIAGNOSIS — R262 Difficulty in walking, not elsewhere classified: Secondary | ICD-10-CM | POA: Diagnosis not present

## 2022-08-18 DIAGNOSIS — R293 Abnormal posture: Secondary | ICD-10-CM | POA: Diagnosis not present

## 2022-08-19 ENCOUNTER — Other Ambulatory Visit: Payer: Self-pay

## 2022-08-19 ENCOUNTER — Emergency Department (HOSPITAL_BASED_OUTPATIENT_CLINIC_OR_DEPARTMENT_OTHER)
Admission: EM | Admit: 2022-08-19 | Discharge: 2022-08-19 | Disposition: A | Discharge status: Home / Self Care | Payer: PPO | Attending: Emergency Medicine | Admitting: Emergency Medicine

## 2022-08-19 ENCOUNTER — Encounter (HOSPITAL_BASED_OUTPATIENT_CLINIC_OR_DEPARTMENT_OTHER): Payer: Self-pay

## 2022-08-19 DIAGNOSIS — M797 Fibromyalgia: Secondary | ICD-10-CM | POA: Insufficient documentation

## 2022-08-19 LAB — BASIC METABOLIC PANEL
Anion gap: 9 (ref 5–15)
BUN: 6 mg/dL (ref 6–20)
CO2: 26 mmol/L (ref 22–32)
Calcium: 9 mg/dL (ref 8.9–10.3)
Chloride: 99 mmol/L (ref 98–111)
Creatinine, Ser: 0.67 mg/dL (ref 0.44–1.00)
GFR, Estimated: >60 mL/min (ref >60)
Glucose, Bld: 83 mg/dL (ref 70–99)
Potassium: 3.5 mmol/L (ref 3.5–5.1)
Sodium: 134 mmol/L — ABNORMAL LOW (ref 135–145)

## 2022-08-19 LAB — CK: Total CK: 62 U/L (ref 38–234)

## 2022-08-19 MED ORDER — DROPERIDOL 2.5 MG/ML IJ SOLN
1.2500 mg | Freq: Once | INTRAMUSCULAR | Status: AC
Start: 2022-08-19 — End: 2022-08-19
  Administered 2022-08-19: 1.25 mg via INTRAVENOUS
  Filled 2022-08-19: qty 2

## 2022-08-19 MED ORDER — LORAZEPAM 2 MG/ML IJ SOLN
1.0000 mg | Freq: Once | INTRAMUSCULAR | Status: AC
  Administered 2022-08-19: 1 mg via INTRAVENOUS
  Filled 2022-08-19: qty 1

## 2022-08-19 MED ORDER — HYDROMORPHONE HCL 1 MG/ML IJ SOLN
0.5000 mg | Freq: Once | INTRAMUSCULAR | Status: AC
  Administered 2022-08-19: 0.5 mg via INTRAVENOUS
  Filled 2022-08-19: qty 1

## 2022-08-19 MED ORDER — HYDROCODONE-ACETAMINOPHEN 5-325 MG PO TABS: 2.0000 | ORAL_TABLET | Freq: Four times a day (QID) | ORAL | 0 refills | Status: DC | PRN

## 2022-08-19 MED ORDER — KETAMINE HCL 10 MG/ML IJ SOLN
0.3000 mg/kg | Freq: Once | INTRAMUSCULAR | Status: AC
  Administered 2022-08-19: 23 mg via INTRAVENOUS
  Filled 2022-08-19: qty 1

## 2022-08-19 MED ORDER — ACETAMINOPHEN 500 MG PO TABS
1000.0000 mg | ORAL_TABLET | Freq: Once | ORAL | Status: DC
  Filled 2022-08-19: qty 2

## 2022-08-19 NOTE — ED Provider Notes (Signed)
[place] EMERGENCY DEPT Provider Note   CSN: [id number] Arrival date & time: 08/19/22  1638     History  Chief Complaint  Patient presents with   Generalized Body Aches    Gina [name] is a 46 y.o. female.  No fevers, no uri sxs, no urinary incont or sxs, no bowel incont or changes in sensation with wiping  Tried tens unit and heating pad Neck upper back shooting pain Also with pains in her arms and legs Hurts to move Started 2 days ago after going to the fair Yesterday saw PT who worked with her but today was much worse Tried flexaril, hydrocodone without improvement of her pain Fibromyalgia  anxiety, CAD, colon polyp, HLD, nicotine dependence who presents to the ED with complaint of bloating Cervical radiculopathy 04/22/2016  Lumbar radiculopathy 04/22/2016  no injruies - similar to prior  check for FU       Home Medications Prior to Admission medications   Medication Sig Start Date End Date Taking? Authorizing Provider  acetaminophen (TYLENOL) 325 MG tablet Take 2 tablets (650 mg total) by mouth every 6 (six) hours as needed. 06/05/22   Jeanell Sparrow, DO  clonazePAM (KLONOPIN) 0.5 MG tablet Take 0.5 mg by mouth 2 (two) times daily.  08/30/18   [provider]  cyclobenzaprine (FLEXERIL) 10 MG tablet Take 10 mg by mouth 3 (three) times daily as needed for muscle spasms.  01/19/19   [provider]  diphenoxylate-atropine (LOMOTIL) 2.5-0.025 MG tablet Take 1 tablet by mouth 4 (four) times daily as needed for diarrhea or loose stools. 05/01/22   Veryl Speak, MD  escitalopram (LEXAPRO) 20 MG tablet Take 30 mg by mouth at bedtime. 12/20/18   [provider]  lurasidone (LATUDA) 80 MG TABS tablet Take 80 mg by mouth at bedtime.     [provider]  methocarbamol (ROBAXIN) 500 MG tablet Take 1 tablet (500 mg total) by mouth 2 (two) times daily. 01/06/21   Gareth Morgan, MD  naloxegol oxalate (MOVANTIK) 25 MG TABS  tablet Take 25 mg by mouth daily as needed (constipation).     [provider]  oxyCODONE-acetaminophen (PERCOCET) 7.5-325 MG tablet Take 1 tablet by mouth 4 (four) times daily.    [provider]  promethazine (PHENERGAN) 25 MG suppository Place 1 suppository (25 mg total) rectally every 6 (six) hours as needed for nausea or vomiting. 03/23/21   Horton, Barbette Hair, MD  promethazine (PHENERGAN) 25 MG tablet Take 1 tablet (25 mg total) by mouth every 6 (six) hours as needed for nausea or vomiting. 01/06/21   Gareth Morgan, MD  promethazine (PHENERGAN) 25 MG tablet Take 1 tablet (25 mg total) by mouth every 6 (six) hours as needed for nausea or vomiting. 06/05/22   Jeanell Sparrow, DO  promethazine-dextromethorphan (PROMETHAZINE-DM) 6.25-15 MG/5ML syrup Take 5 mLs by mouth 4 (four) times daily as needed for cough. 05/15/20   Daleen Squibb, MD  REXULTI 1 MG TABS Take 1 tablet by mouth daily.  01/19/19   [provider]  simethicone (GAS-X) 80 MG chewable tablet Chew 1 tablet (80 mg total) by mouth every 6 (six) hours as needed for flatulence. 06/05/22   Jeanell Sparrow, DO      Allergies    Lamictal [lamotrigine], Morphine and related, Nicotine, Zofran, Chantix [varenicline tartrate], Codeine, Haldol [haloperidol], Ibuprofen, Lidoderm [lidocaine], Metaxalone, Sulfa antibiotics, Sulfasalazine, Toradol [ketorolac tromethamine], Duloxetine, Gabapentin, Ingrezza [valbenazine tosylate], Methadone, Pregabalin, Zolpidem, Amoxicillin, Aripiprazole, Penicillins, and  Valbenazine    Review of Systems   Review of Systems  Physical Exam Updated Vital Signs BP 118/83 (BP Location: Left Arm)   Pulse 94   Temp 99.1 F (37.3 C)   Resp 18   Ht '5\' 7"'$  (1.702 m)   Wt 77.1 kg   LMP 01/01/2004   SpO2 99%   BMI 26.63 kg/m  ***neuro normal Physical Exam  ED Results / Procedures / Treatments   Labs (all labs ordered are listed, but only abnormal results are displayed) Labs Reviewed -  No data to display  EKG None  Radiology No results found.  Procedures Procedures  {Document cardiac monitor, telemetry assessment procedure when appropriate:1}  Medications Ordered in ED Medications - No data to display  ED Course/ Medical Decision Making/ A&P                           Medical Decision Making Amount and/or Complexity of Data Reviewed Labs: ordered.  Risk Prescription drug management.   ***  {Document critical care time when appropriate:1} {Document review of labs and clinical decision tools ie heart score, Chads2Vasc2 etc:1}  {Document your independent review of radiology images, and any outside records:1} {Document your discussion with family members, caretakers, and with consultants:1} {Document social determinants of health affecting pt's care:1} {Document your decision making why or why not admission, treatments were needed:1} Final Clinical Impression(s) / ED Diagnoses Final diagnoses:  None    Rx / DC Orders ED Discharge Orders     None

## 2022-08-19 NOTE — Discharge Instructions (Addendum)
Today you were seen in the emergency department for your muscle pain.    In the emergency department you received multiple pain medications which improved your symptoms.    At home, please take Tylenol and Motrin as needed for your pain.  Take your prescribed muscle relaxers.  May also take the Norco we have prescribed you for any breakthrough pain.  Please use this medication with caution while taking with muscle relaxers and do not take them at the same time as it can make you very drowsy.  Do not take these while driving or operating heavy machinery.    Check your MyChart online for the results of any tests that had not resulted by the time you left the emergency department.   Follow-up with your primary doctor in 2-3 days regarding your visit.  Schedule follow-up with pain medicine for additional management.  Return immediately to the emergency department if you experience any of the following: Numbness or weakness of your arms or legs, bowel or bladder incontinence, or any other concerning symptoms.    Thank you for visiting our Emergency Department. It was a pleasure taking care of you today.

## 2022-08-19 NOTE — ED Notes (Signed)
Pt states she went to the [facility] on Tuesday and walked for about 6 hrs, woke up yesterday with pain and made appt with physical therapy.  They worked with her 1.5 hrs and now pain is worse and is having "body spasms from head to toe.   Spouse at bedside

## 2022-08-19 NOTE — ED Triage Notes (Signed)
Pt states she woke up this morning with pain from head to toe. She denies injury, [place] and also had physical therapy yesterday that lasted about two hours. Pt states pain is worse in her shoulders and back.

## 2022-08-23 DIAGNOSIS — R293 Abnormal posture: Secondary | ICD-10-CM | POA: Diagnosis not present

## 2022-08-23 DIAGNOSIS — R262 Difficulty in walking, not elsewhere classified: Secondary | ICD-10-CM | POA: Diagnosis not present

## 2022-08-23 DIAGNOSIS — M62838 Other muscle spasm: Secondary | ICD-10-CM | POA: Diagnosis not present

## 2022-08-25 DIAGNOSIS — R262 Difficulty in walking, not elsewhere classified: Secondary | ICD-10-CM | POA: Diagnosis not present

## 2022-08-25 DIAGNOSIS — M62838 Other muscle spasm: Secondary | ICD-10-CM | POA: Diagnosis not present

## 2022-08-25 DIAGNOSIS — R293 Abnormal posture: Secondary | ICD-10-CM | POA: Diagnosis not present

## 2022-08-27 DIAGNOSIS — E871 Hypo-osmolality and hyponatremia: Secondary | ICD-10-CM | POA: Diagnosis not present

## 2022-08-27 DIAGNOSIS — Z1152 Encounter for screening for COVID-19: Secondary | ICD-10-CM | POA: Diagnosis not present

## 2022-08-27 DIAGNOSIS — B349 Viral infection, unspecified: Secondary | ICD-10-CM | POA: Diagnosis not present

## 2022-08-27 DIAGNOSIS — R509 Fever, unspecified: Secondary | ICD-10-CM | POA: Diagnosis not present

## 2022-08-27 DIAGNOSIS — R0981 Nasal congestion: Secondary | ICD-10-CM | POA: Diagnosis not present

## 2022-08-27 DIAGNOSIS — R051 Acute cough: Secondary | ICD-10-CM | POA: Diagnosis not present

## 2022-08-27 DIAGNOSIS — J029 Acute pharyngitis, unspecified: Secondary | ICD-10-CM | POA: Diagnosis not present

## 2022-09-02 DIAGNOSIS — J208 Acute bronchitis due to other specified organisms: Secondary | ICD-10-CM | POA: Diagnosis not present

## 2022-09-02 DIAGNOSIS — B349 Viral infection, unspecified: Secondary | ICD-10-CM | POA: Diagnosis not present

## 2022-09-02 DIAGNOSIS — R509 Fever, unspecified: Secondary | ICD-10-CM | POA: Diagnosis not present

## 2022-09-02 DIAGNOSIS — R051 Acute cough: Secondary | ICD-10-CM | POA: Diagnosis not present

## 2022-09-06 DIAGNOSIS — L820 Inflamed seborrheic keratosis: Secondary | ICD-10-CM | POA: Diagnosis not present

## 2022-09-06 DIAGNOSIS — L814 Other melanin hyperpigmentation: Secondary | ICD-10-CM | POA: Diagnosis not present

## 2022-09-06 DIAGNOSIS — L918 Other hypertrophic disorders of the skin: Secondary | ICD-10-CM | POA: Diagnosis not present

## 2022-09-09 DIAGNOSIS — F312 Bipolar disorder, current episode manic severe with psychotic features: Secondary | ICD-10-CM | POA: Diagnosis not present

## 2022-09-09 DIAGNOSIS — F902 Attention-deficit hyperactivity disorder, combined type: Secondary | ICD-10-CM | POA: Diagnosis not present

## 2022-09-10 ENCOUNTER — Encounter (HOSPITAL_BASED_OUTPATIENT_CLINIC_OR_DEPARTMENT_OTHER): Payer: Self-pay | Admitting: Emergency Medicine

## 2022-09-10 ENCOUNTER — Other Ambulatory Visit: Payer: Self-pay

## 2022-09-10 ENCOUNTER — Emergency Department (HOSPITAL_BASED_OUTPATIENT_CLINIC_OR_DEPARTMENT_OTHER): Payer: PPO

## 2022-09-10 ENCOUNTER — Encounter (HOSPITAL_COMMUNITY): Payer: Self-pay

## 2022-09-10 ENCOUNTER — Inpatient Hospital Stay (HOSPITAL_BASED_OUTPATIENT_CLINIC_OR_DEPARTMENT_OTHER)
Admission: EM | Admit: 2022-09-10 | Discharge: 2022-09-14 | DRG: 389 | Disposition: A | Discharge status: Home / Self Care | Payer: PPO | Source: Ambulatory Visit | Attending: Internal Medicine | Admitting: Internal Medicine

## 2022-09-10 DIAGNOSIS — G8929 Other chronic pain: Secondary | ICD-10-CM | POA: Diagnosis present

## 2022-09-10 DIAGNOSIS — Z79899 Other long term (current) drug therapy: Secondary | ICD-10-CM | POA: Diagnosis not present

## 2022-09-10 DIAGNOSIS — Z880 Allergy status to penicillin: Secondary | ICD-10-CM | POA: Diagnosis not present

## 2022-09-10 DIAGNOSIS — Z888 Allergy status to other drugs, medicaments and biological substances status: Secondary | ICD-10-CM

## 2022-09-10 DIAGNOSIS — K56600 Partial intestinal obstruction, unspecified as to cause: Secondary | ICD-10-CM | POA: Diagnosis not present

## 2022-09-10 DIAGNOSIS — F3162 Bipolar disorder, current episode mixed, moderate: Secondary | ICD-10-CM | POA: Diagnosis not present

## 2022-09-10 DIAGNOSIS — Z882 Allergy status to sulfonamides status: Secondary | ICD-10-CM

## 2022-09-10 DIAGNOSIS — M069 Rheumatoid arthritis, unspecified: Secondary | ICD-10-CM | POA: Diagnosis present

## 2022-09-10 DIAGNOSIS — R109 Unspecified abdominal pain: Secondary | ICD-10-CM | POA: Diagnosis not present

## 2022-09-10 DIAGNOSIS — K5651 Intestinal adhesions [bands], with partial obstruction: Principal | ICD-10-CM | POA: Diagnosis present

## 2022-09-10 DIAGNOSIS — F17210 Nicotine dependence, cigarettes, uncomplicated: Secondary | ICD-10-CM | POA: Diagnosis present

## 2022-09-10 DIAGNOSIS — Z8261 Family history of arthritis: Secondary | ICD-10-CM | POA: Diagnosis not present

## 2022-09-10 DIAGNOSIS — Z4682 Encounter for fitting and adjustment of non-vascular catheter: Secondary | ICD-10-CM | POA: Diagnosis not present

## 2022-09-10 DIAGNOSIS — Z885 Allergy status to narcotic agent status: Secondary | ICD-10-CM

## 2022-09-10 DIAGNOSIS — Z818 Family history of other mental and behavioral disorders: Secondary | ICD-10-CM | POA: Diagnosis not present

## 2022-09-10 DIAGNOSIS — M797 Fibromyalgia: Secondary | ICD-10-CM | POA: Diagnosis present

## 2022-09-10 DIAGNOSIS — K219 Gastro-esophageal reflux disease without esophagitis: Secondary | ICD-10-CM | POA: Diagnosis not present

## 2022-09-10 DIAGNOSIS — Z90711 Acquired absence of uterus with remaining cervical stump: Secondary | ICD-10-CM

## 2022-09-10 DIAGNOSIS — Z87440 Personal history of urinary (tract) infections: Secondary | ICD-10-CM | POA: Diagnosis not present

## 2022-09-10 DIAGNOSIS — K529 Noninfective gastroenteritis and colitis, unspecified: Secondary | ICD-10-CM | POA: Diagnosis not present

## 2022-09-10 DIAGNOSIS — R1032 Left lower quadrant pain: Secondary | ICD-10-CM | POA: Diagnosis not present

## 2022-09-10 DIAGNOSIS — K56609 Unspecified intestinal obstruction, unspecified as to partial versus complete obstruction: Secondary | ICD-10-CM

## 2022-09-10 DIAGNOSIS — K5669 Other partial intestinal obstruction: Secondary | ICD-10-CM | POA: Diagnosis not present

## 2022-09-10 DIAGNOSIS — R112 Nausea with vomiting, unspecified: Secondary | ICD-10-CM | POA: Diagnosis not present

## 2022-09-10 DIAGNOSIS — N760 Acute vaginitis: Secondary | ICD-10-CM | POA: Diagnosis not present

## 2022-09-10 DIAGNOSIS — Z9049 Acquired absence of other specified parts of digestive tract: Secondary | ICD-10-CM | POA: Diagnosis not present

## 2022-09-10 DIAGNOSIS — Z83438 Family history of other disorder of lipoprotein metabolism and other lipidemia: Secondary | ICD-10-CM

## 2022-09-10 DIAGNOSIS — Z8249 Family history of ischemic heart disease and other diseases of the circulatory system: Secondary | ICD-10-CM

## 2022-09-10 DIAGNOSIS — R300 Dysuria: Secondary | ICD-10-CM | POA: Diagnosis not present

## 2022-09-10 DIAGNOSIS — R102 Pelvic and perineal pain: Secondary | ICD-10-CM | POA: Diagnosis not present

## 2022-09-10 LAB — COMPREHENSIVE METABOLIC PANEL
ALT: 21 U/L (ref 0–44)
AST: 18 U/L (ref 15–41)
Albumin: 4.3 g/dL (ref 3.5–5.0)
Alkaline Phosphatase: 77 U/L (ref 38–126)
Anion gap: 11 (ref 5–15)
BUN: 9 mg/dL (ref 6–20)
CO2: 24 mmol/L (ref 22–32)
Calcium: 9.1 mg/dL (ref 8.9–10.3)
Chloride: 104 mmol/L (ref 98–111)
Creatinine, Ser: 0.6 mg/dL (ref 0.44–1.00)
GFR, Estimated: >60 mL/min (ref >60)
Glucose, Bld: 81 mg/dL (ref 70–99)
Potassium: 3.8 mmol/L (ref 3.5–5.1)
Sodium: 139 mmol/L (ref 135–145)
Total Bilirubin: 0.5 mg/dL (ref 0.3–1.2)
Total Protein: 6.7 g/dL (ref 6.5–8.1)

## 2022-09-10 LAB — URINALYSIS, ROUTINE W REFLEX MICROSCOPIC
Bilirubin Urine: NEGATIVE
Glucose, UA: NEGATIVE mg/dL
Hgb urine dipstick: NEGATIVE
Leukocytes,Ua: NEGATIVE
Nitrite: NEGATIVE
Protein, ur: NEGATIVE mg/dL
Specific Gravity, Urine: 1.036 — ABNORMAL HIGH (ref 1.005–1.030)
pH: 6 (ref 5.0–8.0)

## 2022-09-10 LAB — CBC
HCT: 44 % (ref 36.0–46.0)
Hemoglobin: 14.7 g/dL (ref 12.0–15.0)
MCH: 32.2 pg (ref 26.0–34.0)
MCHC: 33.4 g/dL (ref 30.0–36.0)
MCV: 96.3 fL (ref 80.0–100.0)
Platelets: 245 10*3/uL (ref 150–400)
RBC: 4.57 MIL/uL (ref 3.87–5.11)
RDW: 12.6 % (ref 11.5–15.5)
WBC: 10.6 10*3/uL — ABNORMAL HIGH (ref 4.0–10.5)
nRBC: 0 % (ref 0.0–0.2)

## 2022-09-10 LAB — PREGNANCY, URINE: Preg Test, Ur: NEGATIVE

## 2022-09-10 LAB — LIPASE, BLOOD: Lipase: <10 U/L — ABNORMAL LOW (ref 11–51)

## 2022-09-10 MED ORDER — HYDROMORPHONE HCL 1 MG/ML IJ SOLN
0.5000 mg | Freq: Once | INTRAMUSCULAR | Status: AC
  Administered 2022-09-10: 0.5 mg via INTRAVENOUS
  Filled 2022-09-10: qty 1

## 2022-09-10 MED ORDER — PROMETHAZINE HCL 25 MG/ML IJ SOLN
INTRAMUSCULAR | Status: AC
  Filled 2022-09-10: qty 1

## 2022-09-10 MED ORDER — HYDROMORPHONE HCL 1 MG/ML IJ SOLN
1.0000 mg | Freq: Once | INTRAMUSCULAR | Status: AC
  Administered 2022-09-10: 1 mg via INTRAVENOUS
  Filled 2022-09-10: qty 1

## 2022-09-10 MED ORDER — SODIUM CHLORIDE 0.9 % IV SOLN
12.5000 mg | Freq: Four times a day (QID) | INTRAVENOUS | Status: DC | PRN
  Administered 2022-09-10 – 2022-09-14 (×10): 12.5 mg via INTRAVENOUS
  Filled 2022-09-10: qty 12.5
  Filled 2022-09-10: qty 0.5
  Filled 2022-09-10 (×8): qty 12.5

## 2022-09-10 MED ORDER — KETAMINE HCL 10 MG/ML IJ SOLN
0.3000 mg/kg | Freq: Once | INTRAMUSCULAR | Status: AC
  Administered 2022-09-10: 25 mg via INTRAVENOUS
  Filled 2022-09-10: qty 1

## 2022-09-10 MED ORDER — SODIUM CHLORIDE 0.9 % IV BOLUS
1000.0000 mL | Freq: Once | INTRAVENOUS | Status: AC
  Administered 2022-09-11: 1000 mL via INTRAVENOUS

## 2022-09-10 MED ORDER — SODIUM CHLORIDE 0.9 % IV BOLUS
1000.0000 mL | Freq: Once | INTRAVENOUS | Status: AC
  Administered 2022-09-10: 1000 mL via INTRAVENOUS

## 2022-09-10 MED ORDER — DROPERIDOL 2.5 MG/ML IJ SOLN
1.2500 mg | Freq: Once | INTRAMUSCULAR | Status: AC
  Administered 2022-09-11: 1.25 mg via INTRAVENOUS
  Filled 2022-09-10: qty 2

## 2022-09-10 MED ORDER — IOHEXOL 300 MG/ML  SOLN
100.0000 mL | Freq: Once | INTRAMUSCULAR | Status: AC | PRN
  Administered 2022-09-10: 100 mL via INTRAVENOUS

## 2022-09-10 NOTE — ED Notes (Signed)
Patient still cannot provide urine sample, states "I don't think I can stand up at this point"

## 2022-09-10 NOTE — ED Triage Notes (Addendum)
C/o abd pain, feeling bloated x 2 days. Sent for CT scan to r/o diverticulitis. Endorses nausea. Currently being tx for vaginal infection. Also reports significant weight gain.

## 2022-09-10 NOTE — Progress Notes (Signed)
  [facility] will assume care on arrival to accepting facility. Until arrival, care as per EDP. However, [facility] available 24/7 for questions and assistance.   Nursing staff please page [facility] Admits and Consults ([phone]) as soon as the patient arrives to the hospital.  Joushua Dugar, DO [facility]  

## 2022-09-10 NOTE — ED Provider Notes (Signed)
[facility] DE[facility] Provider Note   CSN: [id number] Arrival date & time: 09/10/22  1400     History  Chief Complaint  Patient presents with   Abdominal Pain    Quina Wilbourne is a 46 y.o. female.  Patient presents to the emergency department complaining of 2 days of abdominal distention, left-sided abdominal pain, nausea, vomiting.  Patient was sent by her OB/GYN to the emergency department for CT scan to evaluate for possible diverticulitis.  She states that she has not had a bowel movement since yesterday and normally has diarrhea at baseline.  She denies passing any flatus today.  Patient currently denies shortness of breath, chest pain, urinary symptoms, vaginal discharge.  Past medical history significant for partial hysterectomy, cesarean section, hernia repair, previous abdominal surgery, endometriosis, GERD, fibromyalgia, panic disorder, UTIs, anxiety, stricture and stenosis of esophagus  HPI     Home Medications Prior to Admission medications   Medication Sig Start Date End Date Taking? Authorizing Provider  clonazePAM (KLONOPIN) 0.5 MG tablet Take 0.5 mg by mouth 2 (two) times daily.  08/30/18   [provider]  cyclobenzaprine (FLEXERIL) 10 MG tablet Take 10 mg by mouth 3 (three) times daily as needed for muscle spasms.  01/19/19   [provider]  diphenoxylate-atropine (LOMOTIL) 2.5-0.025 MG tablet Take 1 tablet by mouth 4 (four) times daily as needed for diarrhea or loose stools. 05/01/22   Veryl Speak, MD  escitalopram (LEXAPRO) 20 MG tablet Take 30 mg by mouth at bedtime. 12/20/18   [provider]  HYDROcodone-acetaminophen (NORCO/VICODIN) 5-325 MG tablet Take 2 tablets by mouth every 6 (six) hours as needed. 08/19/22   Fransico Meadow, MD  lurasidone (LATUDA) 80 MG TABS tablet Take 80 mg by mouth at bedtime.     [provider]  methocarbamol (ROBAXIN) 500 MG tablet Take 1 tablet (500 mg total) by mouth 2  (two) times daily. 01/06/21   Gareth Morgan, MD  naloxegol oxalate (MOVANTIK) 25 MG TABS tablet Take 25 mg by mouth daily as needed (constipation).     [provider]  oxyCODONE-acetaminophen (PERCOCET) 7.5-325 MG tablet Take 1 tablet by mouth 4 (four) times daily.    [provider]  promethazine (PHENERGAN) 25 MG suppository Place 1 suppository (25 mg total) rectally every 6 (six) hours as needed for nausea or vomiting. 03/23/21   Horton, Barbette Hair, MD  promethazine (PHENERGAN) 25 MG tablet Take 1 tablet (25 mg total) by mouth every 6 (six) hours as needed for nausea or vomiting. 01/06/21   Gareth Morgan, MD  promethazine (PHENERGAN) 25 MG tablet Take 1 tablet (25 mg total) by mouth every 6 (six) hours as needed for nausea or vomiting. 06/05/22   Jeanell Sparrow, DO  promethazine-dextromethorphan (PROMETHAZINE-DM) 6.25-15 MG/5ML syrup Take 5 mLs by mouth 4 (four) times daily as needed for cough. 05/15/20   Daleen Squibb, MD  REXULTI 1 MG TABS Take 1 tablet by mouth daily.  01/19/19   [provider]  simethicone (GAS-X) 80 MG chewable tablet Chew 1 tablet (80 mg total) by mouth every 6 (six) hours as needed for flatulence. 06/05/22   Jeanell Sparrow, DO      Allergies    Lamictal [lamotrigine], Morphine and related, Nicotine, Zofran, Chantix [varenicline tartrate], Codeine, Haldol [haloperidol], Ibuprofen, Lidoderm [lidocaine], Metaxalone, Sulfa antibiotics, Sulfasalazine, Toradol [ketorolac tromethamine], Duloxetine, Gabapentin, Ingrezza [valbenazine tosylate], Methadone, Pregabalin, Zolpidem, Amoxicillin, Aripiprazole, Penicillins, and Valbenazine    Review of Systems  Review of Systems  Constitutional:  Negative for fever.  Respiratory:  Negative for shortness of breath.   Cardiovascular:  Negative for chest pain.  Gastrointestinal:  Positive for abdominal distention, abdominal pain, nausea and vomiting. Negative for diarrhea.  Genitourinary:  Negative for  dysuria, vaginal bleeding and vaginal discharge.    Physical Exam Updated Vital Signs BP 122/87   Pulse 76   Temp 98.3 F (36.8 C) (Oral)   Resp 13   Ht '5\' 7"'$  (1.702 m)   Wt 82.1 kg   LMP 01/01/2004   SpO2 98%   BMI 28.35 kg/m  Physical Exam Vitals and nursing note reviewed.  Constitutional:      General: She is not in acute distress.    Appearance: She is well-developed.  HENT:     Head: Normocephalic and atraumatic.  Eyes:     Conjunctiva/sclera: Conjunctivae normal.  Cardiovascular:     Rate and Rhythm: Normal rate and regular rhythm.     Heart sounds: No murmur heard. Pulmonary:     Effort: Pulmonary effort is normal. No respiratory distress.     Breath sounds: Normal breath sounds.  Abdominal:     General: There is distension.     Palpations: Abdomen is soft.     Tenderness: There is abdominal tenderness in the left upper quadrant and left lower quadrant.  Musculoskeletal:        General: No swelling.     Cervical back: Neck supple.  Skin:    General: Skin is warm and dry.     Capillary Refill: Capillary refill takes less than 2 seconds.  Neurological:     Mental Status: She is alert.  Psychiatric:        Mood and Affect: Mood normal.     ED Results / Procedures / Treatments   Labs (all labs ordered are listed, but only abnormal results are displayed) Labs Reviewed  LIPASE, BLOOD - Abnormal; Notable for the following components:      Result Value   Lipase <10 (*)    All other components within normal limits  CBC - Abnormal; Notable for the following components:   WBC 10.6 (*)    All other components within normal limits  URINALYSIS, ROUTINE W REFLEX MICROSCOPIC - Abnormal; Notable for the following components:   Specific Gravity, Urine 1.036 (*)    Ketones, ur TRACE (*)    All other components within normal limits  COMPREHENSIVE METABOLIC PANEL  PREGNANCY, URINE    EKG None  Radiology CT Abdomen Pelvis W Contrast  Result Date:  09/10/2022 CLINICAL DATA:  Left lower quadrant abdominal pain. EXAM: CT ABDOMEN AND PELVIS WITH CONTRAST TECHNIQUE: Multidetector CT imaging of the abdomen and pelvis was performed using the standard protocol following bolus administration of intravenous contrast. RADIATION DOSE REDUCTION: This exam was performed according to the departmental dose-optimization program which includes automated exposure control, adjustment of the mA and/or kV according to patient size and/or use of iterative reconstruction technique. CONTRAST:  170m OMNIPAQUE IOHEXOL 300 MG/ML  SOLN COMPARISON:  CT abdomen pelvis dated 06/05/2022. FINDINGS: Lower chest: The visualized lung bases are clear. No intra-abdominal free air or free fluid. Hepatobiliary: The liver is unremarkable. No biliary dilatation. Cholecystectomy. No retained calcified stone noted in the central CBD. Pancreas: Unremarkable. No pancreatic ductal dilatation or surrounding inflammatory changes. Spleen: Normal in size without focal abnormality. Adrenals/Urinary Tract: The adrenal glands are unremarkable. Subcentimeter right renal interpolar hypodense lesion is too small to characterize but similar to prior  CT, likely a cyst. No focal. There is no hydronephrosis on either side. The visualized ureters and urinary bladder appear unremarkable. Stomach/Bowel: Evaluation of the bowel is limited in the absence of or contrast. There is a cluster of small bowel loops in the right hemipelvis abutting the right bladder dome and vaginal cuff most consistent with adhesions. Several collapsed segments of the small bowel in the right hemipelvis may be related to peristalsis. A degree of stricture is not excluded. The bowel loops measure up to approximately 2.5 cm in caliber. Small-bowel series study may provide better evaluation if there is clinical concern for developing obstruction. The appendix is normal. Vascular/Lymphatic: The abdominal aorta and IVC are unremarkable. No portal  venous gas. There is no adenopathy. Reproductive: Hysterectomy.  No adnexal masses. Other: Midline vertical anterior pelvic wall incisional scar. Musculoskeletal: No acute or significant osseous findings. IMPRESSION: Findings of small-bowel adhesions in the right hemipelvis. Top-normal caliber small bowel loops in the right lower quadrant may be physiologic. A partial obstruction or stricture is not entirely excluded. Small-bowel series may provide better evaluation if there is clinical concern for developing obstruction. Electronically Signed   By: Anner Crete M.D.   On: 09/10/2022 21:04    Procedures Procedures    Medications Ordered in ED Medications  promethazine (PHENERGAN) 12.5 mg in sodium chloride 0.9 % 50 mL IVPB (0 mg Intravenous Paused 09/10/22 2052)  promethazine (PHENERGAN) 25 MG/ML injection (has no administration in time range)  sodium chloride 0.9 % bolus 1,000 mL ( Intravenous Stopped 09/10/22 2203)  HYDROmorphone (DILAUDID) injection 0.5 mg (0.5 mg Intravenous Given 09/10/22 2015)  HYDROmorphone (DILAUDID) injection 1 mg (1 mg Intravenous Given 09/10/22 2030)  iohexol (OMNIPAQUE) 300 MG/ML solution 100 mL (100 mLs Intravenous Contrast Given 09/10/22 2031)  ketamine (KETALAR) injection 25 mg (25 mg Intravenous Given 09/10/22 2157)    ED Course/ Medical Decision Making/ A&P                           Medical Decision Making Amount and/or Complexity of Data Reviewed Labs: ordered. Radiology: ordered.  Risk Prescription drug management.   This patient presents to the ED for concern of left-sided abdominal pain, this involves an extensive number of treatment options, and is a complaint that carries with it a high risk of complications and morbidity.  The differential diagnosis includes diverticulitis, pyelonephritis, nephrolithiasis, colitis, small bowel obstruction, and others   Co morbidities that complicate the patient evaluation  History of multiple abdominal  surgeries, anxiety, fibromyalgia   Additional history obtained:  Additional history obtained from family bedside    Lab Tests:  I Ordered, and personally interpreted labs.  The pertinent results include: Grossly unremarkable CMP, CBC, lipase, urinalysis   Imaging Studies ordered:  I ordered imaging studies including CT abdomen pelvis with contrast I independently visualized and interpreted imaging which showed  Findings of small-bowel adhesions in the right hemipelvis.  Top-normal caliber small bowel loops in the right lower quadrant may  be physiologic. A partial obstruction or stricture is not entirely  excluded.   I agree with the radiologist interpretation    Consultations Obtained:  I requested consultation with the general surgeon. Consult pending   Problem List / ED Course / Critical interventions / Medication management   I ordered medication including Dilaudid and ketamine for pain, Phenergan for nausea Reevaluation of the patient after these medicines showed that the patient  had an improvement in her nausea but  continued to have pain I have reviewed the patients home medicines and have made adjustments as needed     Test / Admission - Considered:  Patient with intractable abdominal pain.  CT scan is concerning for adhesions and possible partial SBO.  Patient's history with no flatus or bowel movement today also concerning for partial SBO.  Patient will likely need admission for pain management and further work-up.  Patient care being handed to Dr. Pearline Cables at shift handoff.  Disposition pending consult with general surgery and likely discussion with hospitalist.        Final Clinical Impression(s) / ED Diagnoses Final diagnoses:  Left lower quadrant abdominal pain    Rx / DC Orders ED Discharge Orders     None         Ronny Bacon 09/10/22 2215    Jeanell Sparrow, DO 09/13/22 2209

## 2022-09-11 ENCOUNTER — Observation Stay (HOSPITAL_COMMUNITY): Payer: PPO

## 2022-09-11 ENCOUNTER — Encounter (HOSPITAL_BASED_OUTPATIENT_CLINIC_OR_DEPARTMENT_OTHER): Payer: Self-pay | Admitting: Internal Medicine

## 2022-09-11 ENCOUNTER — Emergency Department (HOSPITAL_BASED_OUTPATIENT_CLINIC_OR_DEPARTMENT_OTHER): Payer: PPO

## 2022-09-11 DIAGNOSIS — M797 Fibromyalgia: Secondary | ICD-10-CM | POA: Diagnosis not present

## 2022-09-11 DIAGNOSIS — Z880 Allergy status to penicillin: Secondary | ICD-10-CM | POA: Diagnosis not present

## 2022-09-11 DIAGNOSIS — K56600 Partial intestinal obstruction, unspecified as to cause: Secondary | ICD-10-CM | POA: Diagnosis not present

## 2022-09-11 DIAGNOSIS — Z87440 Personal history of urinary (tract) infections: Secondary | ICD-10-CM | POA: Diagnosis not present

## 2022-09-11 DIAGNOSIS — Z4682 Encounter for fitting and adjustment of non-vascular catheter: Secondary | ICD-10-CM | POA: Diagnosis not present

## 2022-09-11 DIAGNOSIS — F3162 Bipolar disorder, current episode mixed, moderate: Secondary | ICD-10-CM | POA: Diagnosis not present

## 2022-09-11 DIAGNOSIS — Z79899 Other long term (current) drug therapy: Secondary | ICD-10-CM | POA: Diagnosis not present

## 2022-09-11 DIAGNOSIS — K529 Noninfective gastroenteritis and colitis, unspecified: Secondary | ICD-10-CM | POA: Diagnosis not present

## 2022-09-11 DIAGNOSIS — Z882 Allergy status to sulfonamides status: Secondary | ICD-10-CM | POA: Diagnosis not present

## 2022-09-11 DIAGNOSIS — Z8249 Family history of ischemic heart disease and other diseases of the circulatory system: Secondary | ICD-10-CM | POA: Diagnosis not present

## 2022-09-11 DIAGNOSIS — Z885 Allergy status to narcotic agent status: Secondary | ICD-10-CM | POA: Diagnosis not present

## 2022-09-11 DIAGNOSIS — K5651 Intestinal adhesions [bands], with partial obstruction: Secondary | ICD-10-CM | POA: Diagnosis not present

## 2022-09-11 DIAGNOSIS — Z90711 Acquired absence of uterus with remaining cervical stump: Secondary | ICD-10-CM | POA: Diagnosis not present

## 2022-09-11 DIAGNOSIS — Z8261 Family history of arthritis: Secondary | ICD-10-CM | POA: Diagnosis not present

## 2022-09-11 DIAGNOSIS — K219 Gastro-esophageal reflux disease without esophagitis: Secondary | ICD-10-CM | POA: Diagnosis not present

## 2022-09-11 DIAGNOSIS — G8929 Other chronic pain: Secondary | ICD-10-CM | POA: Diagnosis not present

## 2022-09-11 DIAGNOSIS — Z818 Family history of other mental and behavioral disorders: Secondary | ICD-10-CM | POA: Diagnosis not present

## 2022-09-11 DIAGNOSIS — M069 Rheumatoid arthritis, unspecified: Secondary | ICD-10-CM | POA: Diagnosis not present

## 2022-09-11 DIAGNOSIS — Z888 Allergy status to other drugs, medicaments and biological substances status: Secondary | ICD-10-CM | POA: Diagnosis not present

## 2022-09-11 DIAGNOSIS — R112 Nausea with vomiting, unspecified: Secondary | ICD-10-CM | POA: Insufficient documentation

## 2022-09-11 DIAGNOSIS — Z9049 Acquired absence of other specified parts of digestive tract: Secondary | ICD-10-CM | POA: Diagnosis not present

## 2022-09-11 DIAGNOSIS — F17210 Nicotine dependence, cigarettes, uncomplicated: Secondary | ICD-10-CM | POA: Diagnosis not present

## 2022-09-11 DIAGNOSIS — Z83438 Family history of other disorder of lipoprotein metabolism and other lipidemia: Secondary | ICD-10-CM | POA: Diagnosis not present

## 2022-09-11 LAB — COMPREHENSIVE METABOLIC PANEL
ALT: 23 U/L (ref 0–44)
AST: 19 U/L (ref 15–41)
Albumin: 3.4 g/dL — ABNORMAL LOW (ref 3.5–5.0)
Alkaline Phosphatase: 70 U/L (ref 38–126)
Anion gap: 7 (ref 5–15)
BUN: 8 mg/dL (ref 6–20)
CO2: 22 mmol/L (ref 22–32)
Calcium: 7.9 mg/dL — ABNORMAL LOW (ref 8.9–10.3)
Chloride: 108 mmol/L (ref 98–111)
Creatinine, Ser: 0.5 mg/dL (ref 0.44–1.00)
GFR, Estimated: >60 mL/min (ref >60)
Glucose, Bld: 72 mg/dL (ref 70–99)
Potassium: 3.5 mmol/L (ref 3.5–5.1)
Sodium: 137 mmol/L (ref 135–145)
Total Bilirubin: 1.2 mg/dL (ref 0.3–1.2)
Total Protein: 6.1 g/dL — ABNORMAL LOW (ref 6.5–8.1)

## 2022-09-11 LAB — CBC WITH DIFFERENTIAL/PLATELET
Abs Immature Granulocytes: 0.04 10*3/uL (ref 0.00–0.07)
Basophils Absolute: 0 10*3/uL (ref 0.0–0.1)
Basophils Relative: 0 %
Eosinophils Absolute: 0.1 10*3/uL (ref 0.0–0.5)
Eosinophils Relative: 1 %
HCT: 41.1 % (ref 36.0–46.0)
Hemoglobin: 13.6 g/dL (ref 12.0–15.0)
Immature Granulocytes: 1 %
Lymphocytes Relative: 20 %
Lymphs Abs: 1.8 10*3/uL (ref 0.7–4.0)
MCH: 32.5 pg (ref 26.0–34.0)
MCHC: 33.1 g/dL (ref 30.0–36.0)
MCV: 98.3 fL (ref 80.0–100.0)
Monocytes Absolute: 0.8 10*3/uL (ref 0.1–1.0)
Monocytes Relative: 9 %
Neutro Abs: 6.1 10*3/uL (ref 1.7–7.7)
Neutrophils Relative %: 69 %
Platelets: 218 10*3/uL (ref 150–400)
RBC: 4.18 MIL/uL (ref 3.87–5.11)
RDW: 12.5 % (ref 11.5–15.5)
WBC: 8.8 10*3/uL (ref 4.0–10.5)
nRBC: 0 % (ref 0.0–0.2)

## 2022-09-11 LAB — HIV ANTIBODY (ROUTINE TESTING W REFLEX): HIV Screen 4th Generation wRfx: NONREACTIVE

## 2022-09-11 MED ORDER — SODIUM CHLORIDE 0.9 % IV SOLN: INTRAVENOUS | Status: DC

## 2022-09-11 MED ORDER — DIATRIZOATE MEGLUMINE & SODIUM 66-10 % PO SOLN
90.0000 mL | Freq: Once | ORAL | Status: AC
  Administered 2022-09-11: 90 mL via NASOGASTRIC
  Filled 2022-09-11: qty 90

## 2022-09-11 MED ORDER — SODIUM CHLORIDE 0.9 % IV SOLN
12.5000 mg | Freq: Once | INTRAVENOUS | Status: AC
  Administered 2022-09-11: 12.5 mg via INTRAVENOUS
  Filled 2022-09-11: qty 0.5

## 2022-09-11 MED ORDER — PROMETHAZINE HCL 25 MG/ML IJ SOLN
INTRAMUSCULAR | Status: AC
  Filled 2022-09-11: qty 1

## 2022-09-11 MED ORDER — LIDOCAINE VISCOUS HCL 2 % MT SOLN
15.0000 mL | Freq: Once | OROMUCOSAL | Status: AC
  Administered 2022-09-11: 15 mL via OROMUCOSAL
  Filled 2022-09-11: qty 15

## 2022-09-11 MED ORDER — LORAZEPAM 2 MG/ML IJ SOLN
1.0000 mg | Freq: Once | INTRAMUSCULAR | Status: AC
  Administered 2022-09-11: 1 mg via INTRAVENOUS
  Filled 2022-09-11: qty 1

## 2022-09-11 MED ORDER — HYDROMORPHONE HCL 1 MG/ML IJ SOLN
0.5000 mg | Freq: Once | INTRAMUSCULAR | Status: AC
  Administered 2022-09-11: 0.5 mg via INTRAVENOUS
  Filled 2022-09-11: qty 1

## 2022-09-11 MED ORDER — DROPERIDOL 2.5 MG/ML IJ SOLN
1.2500 mg | Freq: Once | INTRAMUSCULAR | Status: AC
  Administered 2022-09-11: 1.25 mg via INTRAVENOUS
  Filled 2022-09-11: qty 2

## 2022-09-11 MED ORDER — HYDROMORPHONE HCL 1 MG/ML IJ SOLN
0.5000 mg | INTRAMUSCULAR | Status: DC | PRN
  Administered 2022-09-11 – 2022-09-12 (×6): 0.5 mg via INTRAVENOUS
  Filled 2022-09-11 (×7): qty 0.5

## 2022-09-11 MED ORDER — LORAZEPAM 2 MG/ML IJ SOLN
1.0000 mg | Freq: Four times a day (QID) | INTRAMUSCULAR | Status: DC | PRN
  Administered 2022-09-11 – 2022-09-14 (×10): 1 mg via INTRAVENOUS
  Filled 2022-09-11 (×10): qty 1

## 2022-09-11 MED ORDER — PANTOPRAZOLE SODIUM 40 MG IV SOLR
40.0000 mg | INTRAVENOUS | Status: DC
  Administered 2022-09-11 – 2022-09-13 (×3): 40 mg via INTRAVENOUS
  Filled 2022-09-11 (×3): qty 10

## 2022-09-11 MED ORDER — PHENOL 1.4 % MT LIQD
1.0000 | OROMUCOSAL | Status: DC | PRN
  Filled 2022-09-11: qty 177

## 2022-09-11 MED ORDER — PROCHLORPERAZINE EDISYLATE 10 MG/2ML IJ SOLN
10.0000 mg | Freq: Four times a day (QID) | INTRAMUSCULAR | Status: DC | PRN
  Administered 2022-09-12 – 2022-09-13 (×2): 10 mg via INTRAVENOUS
  Filled 2022-09-11 (×3): qty 2

## 2022-09-11 MED ORDER — ENOXAPARIN SODIUM 40 MG/0.4ML IJ SOSY
40.0000 mg | PREFILLED_SYRINGE | INTRAMUSCULAR | Status: DC
  Administered 2022-09-11 – 2022-09-13 (×3): 40 mg via SUBCUTANEOUS
  Filled 2022-09-11 (×3): qty 0.4

## 2022-09-11 NOTE — H&P (Signed)
History and Physical    Patient: Gina [name] LZJ:[id number] DOB: 10-07-1976 DOA: 09/10/2022 DOS: the patient was seen and examined on 09/11/2022 PCP: Velna Hatchet, MD  Patient coming from: Home  Chief Complaint:  Chief Complaint  Patient presents with   Abdominal Pain   HPI: Gina [name] is a 46 y.o. female with medical history significant of fibromyalgia, bipolar d/o. Presenting with lower left abdominal pain. She's had ab pain, N/V for the last couple of days. She has not had any fevers or sick contacts. She did not have any blood in her vomit. She did not have any pain medications to try, but she did try phenergan that provided some relief. She's not had any vaginal discharge. When her symptoms did not improve, she went to her OB-Gyn. It was recommended that she go to the ED for evaluation. She denies any other aggravating or alleviating factors.     Review of Systems: As mentioned in the history of present illness. All other systems reviewed and are negative. 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   Eating disorder    ENDOMETRIOSIS 12/16/2009   FATIGUE 06/11/2010   Fibromyalgia    GERD 10/23/2009   Heart palpitations    HERPES SIMPLEX INFECTION 06/11/2010   LENTIGO 04/23/2009   LUNG NODULE 06/17/2010   Narcotic abuse, continuous ([place])    Ovarian cyst    Palpitations 10/23/2009   Panic disorder    Renal disorder    Rheumatoid arthritis(714.0)    Stricture and stenosis of esophagus 05/13/2010   TOBACCO ABUSE 08/07/2008   Urinary tract infection    UTI (urinary tract infection)    Past  Surgical History:  Procedure Laterality Date   ABDOMINAL SURGERY     ABDOMINAL WALL MESH  REMOVAL     ABLATION ON ENDOMETRIOSIS     CESAREAN SECTION     x2    DILATION AND CURETTAGE OF UTERUS     x1    ENDOMETRIAL ABLATION     esophageal dilatation x 4     GUM SURGERY     HERNIA REPAIR     X3   PARTIAL HYSTERECTOMY     Social History:  reports that she has been smoking cigarettes. She has been smoking an average of .2 packs per day. She has never used smokeless tobacco. She reports that she does not drink alcohol and does not use drugs.  Allergies  Allergen Reactions   Lamictal [Lamotrigine] Anaphylaxis and Other (See Comments)    STEVEN JOHNSON'S SYNDROME   Morphine And Related Other (See Comments)    Makes pt "very mean" "I get mean" Makes pt "very mean" Makes pt "very mean"   Nicotine Swelling and Palpitations    Other reaction(s): Respiratory Distress (ALLERGY/intolerance), Swelling (ALLERGY/intolerance), Tachycardia / Palpitations  (intolerance) Patches, lozenges Patches caused palpations lozenges throat swelled   Zofran Anaphylaxis   Chantix [Varenicline Tartrate] Other (See Comments)     Diaphoresis, sweating.  Night terrors.  "went crazy"   Codeine Nausea And Vomiting   Haldol [Haloperidol] Itching, Rash and Other (See Comments)    FLUSHING  Ibuprofen Nausea And Vomiting   Lidoderm [Lidocaine] Other (See Comments)    DOESN'T WORK FOR PATIENT   Metaxalone Other (See Comments)    Pt does not remember reaction, REAL BAD REACTION   Sulfa Antibiotics Diarrhea, Other (See Comments) and Nausea And Vomiting    GI PAINS ALSO GI PAINS ALSO    Sulfasalazine Diarrhea    GI PAINS   Toradol [Ketorolac Tromethamine] Nausea And Vomiting   Duloxetine Other (See Comments)    unk   Gabapentin Nausea And Vomiting   Ingrezza [Valbenazine Tosylate] Hives   Methadone     seizures   Pregabalin     unk   Zolpidem     Mental status changes   Amoxicillin Rash    Has patient  had a PCN reaction causing immediate rash, facial/tongue/throat swelling, SOB or lightheadedness with hypotension: No Has patient had a PCN reaction causing severe rash involving mucus membranes or skin necrosis: No Has patient had a PCN reaction that required hospitalization: No Has patient had a PCN reaction occurring within the last 10 years: No If all of the above answers are "NO", then may proceed with Cephalosporin use.    Aripiprazole Anxiety    hallucinations   Penicillins Rash    Other reaction(s): Urticaria / Hives (ALLERGY) Has patient had a PCN reaction causing immediate rash, facial/tongue/throat swelling, SOB or lightheadedness with hypotension: No Has patient had a PCN reaction causing severe rash involving mucus membranes or skin necrosis: No Has patient had a PCN reaction that required hospitalization: No Has patient had a PCN reaction occurring within the last 10 years: No If all of the above answers are "NO", then may proceed with Cephalosporin use.   Valbenazine Rash    Family History  Problem Relation Age of Onset   Depression Mother    Arthritis Mother    Hyperlipidemia Father    Hypertension Father     Prior to Admission medications   Medication Sig Start Date End Date Taking? Authorizing Provider  clonazePAM (KLONOPIN) 0.5 MG tablet Take 0.5 mg by mouth 2 (two) times daily.  08/30/18   [provider]  cyclobenzaprine (FLEXERIL) 10 MG tablet Take 10 mg by mouth 3 (three) times daily as needed for muscle spasms.  01/19/19   [provider]  diphenoxylate-atropine (LOMOTIL) 2.5-0.025 MG tablet Take 1 tablet by mouth 4 (four) times daily as needed for diarrhea or loose stools. 05/01/22   Veryl Speak, MD  escitalopram (LEXAPRO) 20 MG tablet Take 30 mg by mouth at bedtime. 12/20/18   [provider]  HYDROcodone-acetaminophen (NORCO/VICODIN) 5-325 MG tablet Take 2 tablets by mouth every 6 (six) hours as needed. 08/19/22   Fransico Meadow,  MD  lurasidone (LATUDA) 80 MG TABS tablet Take 80 mg by mouth at bedtime.     [provider]  methocarbamol (ROBAXIN) 500 MG tablet Take 1 tablet (500 mg total) by mouth 2 (two) times daily. 01/06/21   Gareth Morgan, MD  naloxegol oxalate (MOVANTIK) 25 MG TABS tablet Take 25 mg by mouth daily as needed (constipation).     [provider]  oxyCODONE-acetaminophen (PERCOCET) 7.5-325 MG tablet Take 1 tablet by mouth 4 (four) times daily.    [provider]  promethazine (PHENERGAN) 25 MG suppository Place 1 suppository (25 mg total) rectally every 6 (six) hours as needed for nausea or vomiting. 03/23/21   Horton, Barbette Hair, MD  promethazine (PHENERGAN) 25 MG tablet Take 1 tablet (25 mg total) by mouth  every 6 (six) hours as needed for nausea or vomiting. 01/06/21   Gareth Morgan, MD  promethazine (PHENERGAN) 25 MG tablet Take 1 tablet (25 mg total) by mouth every 6 (six) hours as needed for nausea or vomiting. 06/05/22   Jeanell Sparrow, DO  promethazine-dextromethorphan (PROMETHAZINE-DM) 6.25-15 MG/5ML syrup Take 5 mLs by mouth 4 (four) times daily as needed for cough. 05/15/20   Daleen Squibb, MD  REXULTI 1 MG TABS Take 1 tablet by mouth daily.  01/19/19   [provider]  simethicone (GAS-X) 80 MG chewable tablet Chew 1 tablet (80 mg total) by mouth every 6 (six) hours as needed for flatulence. 06/05/22   Jeanell Sparrow, DO    Physical Exam: Vitals:   09/11/22 1000 09/11/22 1030 09/11/22 1100 09/11/22 1202  BP: 106/62 (!) 111/93 116/75   Pulse: 70 73 70   Resp: (!) '29 16 15   '$ Temp:    98.1 F (36.7 C)  TempSrc:    Oral  SpO2: 100% 96% (!) 88%   Weight:      Height:       General: 46 y.o. female resting in bed in NAD Eyes: PERRL, normal sclera ENMT: Nares patent w/o discharge, orophaynx clear, dentition normal, ears w/o discharge/lesions/ulcers Neck: Supple, trachea midline Cardiovascular: RRR, +S1, S2, no m/g/r, equal pulses  throughout Respiratory: CTABL, no w/r/r, normal WOB GI: BS+, NDNT, no masses noted, no organomegaly noted MSK: No e/c/c Neuro: A&O x 3, no focal deficits Psyc: Appropriate interaction and affect, calm/cooperative  Data Reviewed:  Results for orders placed or performed during the hospital encounter of 09/10/22 (from the past 24 hour(s))  Lipase, blood     Status: Abnormal   Collection Time: 09/10/22  4:22 PM  Result Value Ref Range   Lipase <10 (L) 11 - 51 U/L  Comprehensive metabolic panel     Status: None   Collection Time: 09/10/22  4:22 PM  Result Value Ref Range   Sodium 139 135 - 145 mmol/L   Potassium 3.8 3.5 - 5.1 mmol/L   Chloride 104 98 - 111 mmol/L   CO2 24 22 - 32 mmol/L   Glucose, Bld 81 70 - 99 mg/dL   BUN 9 6 - 20 mg/dL   Creatinine, Ser 0.60 0.44 - 1.00 mg/dL   Calcium 9.1 8.9 - 10.3 mg/dL   Total Protein 6.7 6.5 - 8.1 g/dL   Albumin 4.3 3.5 - 5.0 g/dL   AST 18 15 - 41 U/L   ALT 21 0 - 44 U/L   Alkaline Phosphatase 77 38 - 126 U/L   Total Bilirubin 0.5 0.3 - 1.2 mg/dL   GFR, Estimated >60 >60 mL/min   Anion gap 11 5 - 15  CBC     Status: Abnormal   Collection Time: 09/10/22  4:22 PM  Result Value Ref Range   WBC 10.6 (H) 4.0 - 10.5 K/uL   RBC 4.57 3.87 - 5.11 MIL/uL   Hemoglobin 14.7 12.0 - 15.0 g/dL   HCT 44.0 36.0 - 46.0 %   MCV 96.3 80.0 - 100.0 fL   MCH 32.2 26.0 - 34.0 pg   MCHC 33.4 30.0 - 36.0 g/dL   RDW 12.6 11.5 - 15.5 %   Platelets 245 150 - 400 K/uL   nRBC 0.0 0.0 - 0.2 %  Urinalysis, Routine w reflex microscopic     Status: Abnormal   Collection Time: 09/10/22  9:03 PM  Result Value Ref Range  Color, Urine YELLOW YELLOW   APPearance CLEAR CLEAR   Specific Gravity, Urine 1.036 (H) 1.005 - 1.030   pH 6.0 5.0 - 8.0   Glucose, UA NEGATIVE NEGATIVE mg/dL   Hgb urine dipstick NEGATIVE NEGATIVE   Bilirubin Urine NEGATIVE NEGATIVE   Ketones, ur TRACE (A) NEGATIVE mg/dL   Protein, ur NEGATIVE NEGATIVE mg/dL   Nitrite NEGATIVE NEGATIVE    Leukocytes,Ua NEGATIVE NEGATIVE  Pregnancy, urine     Status: None   Collection Time: 09/10/22  9:03 PM  Result Value Ref Range   Preg Test, Ur NEGATIVE NEGATIVE   CT ab/pelvis: Findings of small-bowel adhesions in the right hemipelvis. Top-normal caliber small bowel loops in the right lower quadrant may be physiologic. A partial obstruction or stricture is not entirely excluded. Small-bowel series may provide better evaluation if there is clinical concern for developing obstruction.  KUB: Nasogastric tube is seen with its distal tip noted within the body of the stomach. Its distal side hole sits proximal to the expected region of the gastroesophageal junction. The bowel gas pattern is normal. No radio-opaque calculi or other significant radiographic abnormality are seen.  Assessment and Plan: pSBO N/V     - placed in obs, med-surg     - continue NGT, SBO protocol     - NPO until eval'd by general surgery     - anti-emetics, pain control, fluids  Chronic pain     - resume home regimen when she is off NPO status  Bipolar d/o     - resume home regimen when she is off NPO status  Advance Care Planning:   Code Status: FULL  Consults: General surgery  Family Communication: None at bedside  Severity of Illness: The appropriate patient status for this patient is OBSERVATION. Observation status is judged to be reasonable and necessary in order to provide the required intensity of service to ensure the patient's safety. The patient's presenting symptoms, physical exam findings, and initial radiographic and laboratory data in the context of their medical condition is felt to place them at decreased risk for further clinical deterioration. Furthermore, it is anticipated that the patient will be medically stable for discharge from the hospital within 2 midnights of admission.   Author: Jonnie Finner, DO 09/11/2022 1:21 PM  For on call review [email or web address].

## 2022-09-11 NOTE — ED Notes (Signed)
Quinana @ [facility] is Probation officer. ABB (NS) 11:43

## 2022-09-11 NOTE — Consult Note (Signed)
Reason for Consult: Small bowel conduction Referring Physician: Dr. Sylvan Cheese  Gina [name] is an 46 y.o. female.  HPI: This is a 46 year old female with a significant past medical history of fibromyalgia, bipolar disorder, chronic abdominal complaints with diarrhea, multiple surgeries for significant endometriosis, at least 3 surgeries for hernia repair, mesh removal, and cholecystectomy.  She presents with several days of abdominal pain with nausea and vomiting and diarrhea.  She has been having the symptoms for at least a year and has had an extensive GI work-up at [place].  She reports her last bowel was 2 days ago.  She reports she rarely passes flatus.  Since her upper and lower endoscopy she reports a lot of burping.  She has had an unremarkable CT scan in June and August of this year.  She had a primary repair of a ventral hernia in 2004 in conjunction with a gynecologic procedure.  She then underwent a laparoscopic incisional hernia repair with mesh in 2006 followed by another repair in 2007 by Dr. Excell Seltzer.  The procedure in 2007 was open.  She has no prior history of a small bowel obstruction.  She denies fevers or chills.  There has been no blood in her stool.  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   Eating disorder    ENDOMETRIOSIS 12/16/2009   FATIGUE 06/11/2010   Fibromyalgia    GERD 10/23/2009   Heart palpitations    HERPES SIMPLEX INFECTION 06/11/2010   LENTIGO 04/23/2009   LUNG NODULE 06/17/2010   Narcotic abuse, continuous ([place])    Ovarian cyst    Palpitations 10/23/2009   Panic  disorder    Renal disorder    Rheumatoid arthritis(714.0)    Stricture and stenosis of esophagus 05/13/2010   TOBACCO ABUSE 08/07/2008   Urinary tract infection    UTI (urinary tract infection)     Past Surgical History:  Procedure Laterality Date   ABDOMINAL SURGERY     ABDOMINAL WALL MESH  REMOVAL     ABLATION ON ENDOMETRIOSIS     CESAREAN SECTION     x2    DILATION AND CURETTAGE OF UTERUS     x1    ENDOMETRIAL ABLATION     esophageal dilatation x 4     GUM SURGERY     HERNIA REPAIR     X3   PARTIAL HYSTERECTOMY      Family History  Problem Relation Age of Onset   Depression Mother    Arthritis Mother    Hyperlipidemia Father    Hypertension Father     Social History:  reports that she has been smoking cigarettes. She has been smoking an average of .2 packs per day. She has never used smokeless tobacco. She reports that she does not drink alcohol and does not use drugs.  Allergies:  Allergies  Allergen Reactions   Lamictal [Lamotrigine] Anaphylaxis and Other (See Comments)    STEVEN JOHNSON'S SYNDROME   Morphine And Related Other (See Comments)    Makes pt "very mean" "I get mean" Makes pt "very mean" Makes pt "very mean"  Nicotine Swelling and Palpitations    Other reaction(s): Respiratory Distress (ALLERGY/intolerance), Swelling (ALLERGY/intolerance), Tachycardia / Palpitations  (intolerance) Patches, lozenges Patches caused palpations lozenges throat swelled   Zofran Anaphylaxis   Chantix [Varenicline Tartrate] Other (See Comments)     Diaphoresis, sweating.  Night terrors.  "went crazy"   Codeine Nausea And Vomiting   Haldol [Haloperidol] Itching, Rash and Other (See Comments)    FLUSHING   Ibuprofen Nausea And Vomiting   Lidoderm [Lidocaine] Other (See Comments)    DOESN'T WORK FOR PATIENT   Metaxalone Other (See Comments)    Pt does not remember reaction, REAL BAD REACTION   Sulfa Antibiotics Diarrhea, Other (See Comments) and Nausea And Vomiting     GI PAINS ALSO GI PAINS ALSO    Sulfasalazine Diarrhea    GI PAINS   Toradol [Ketorolac Tromethamine] Nausea And Vomiting   Duloxetine Other (See Comments)    unk   Duloxetine Hcl Other (See Comments)   Gabapentin Nausea And Vomiting   Ingrezza [Valbenazine Tosylate] Hives   Methadone     seizures   Pregabalin     unk   Tramadol Other (See Comments)   Zolpidem     Mental status changes   Zolpidem Tartrate Other (See Comments)   Amoxicillin Rash    Has patient had a PCN reaction causing immediate rash, facial/tongue/throat swelling, SOB or lightheadedness with hypotension: No Has patient had a PCN reaction causing severe rash involving mucus membranes or skin necrosis: No Has patient had a PCN reaction that required hospitalization: No Has patient had a PCN reaction occurring within the last 10 years: No If all of the above answers are "NO", then may proceed with Cephalosporin use.    Aripiprazole Anxiety    hallucinations   Penicillins Rash    Other reaction(s): Urticaria / Hives (ALLERGY) Has patient had a PCN reaction causing immediate rash, facial/tongue/throat swelling, SOB or lightheadedness with hypotension: No Has patient had a PCN reaction causing severe rash involving mucus membranes or skin necrosis: No Has patient had a PCN reaction that required hospitalization: No Has patient had a PCN reaction occurring within the last 10 years: No If all of the above answers are "NO", then may proceed with Cephalosporin use.   Valbenazine Rash    Medications: I have reviewed her medications  Results for orders placed or performed during the hospital encounter of 09/10/22 (from the past 48 hour(s))  Lipase, blood     Status: Abnormal   Collection Time: 09/10/22  4:22 PM  Result Value Ref Range   Lipase <10 (L) 11 - 51 U/L    Comment: Performed at [facility], [address], [place], [place] [address]  Comprehensive metabolic panel     Status: None    Collection Time: 09/10/22  4:22 PM  Result Value Ref Range   Sodium 139 135 - 145 mmol/L   Potassium 3.8 3.5 - 5.1 mmol/L   Chloride 104 98 - 111 mmol/L   CO2 24 22 - 32 mmol/L   Glucose, Bld 81 70 - 99 mg/dL    Comment: Glucose reference range applies only to samples taken after fasting for at least 8 hours.   BUN 9 6 - 20 mg/dL   Creatinine, Ser 0.60 0.44 - 1.00 mg/dL   Calcium 9.1 8.9 - 10.3 mg/dL   Total Protein 6.7 6.5 - 8.1 g/dL   Albumin 4.3 3.5 - 5.0 g/dL   AST 18 15 - 41 U/L   ALT  21 0 - 44 U/L   Alkaline Phosphatase 77 38 - 126 U/L   Total Bilirubin 0.5 0.3 - 1.2 mg/dL   GFR, Estimated >60 >60 mL/min    Comment: (NOTE) Calculated using the CKD-EPI Creatinine Equation (2021)    Anion gap 11 5 - 15    Comment: Performed at [facility], [address], [place], [place] [address]  CBC     Status: Abnormal   Collection Time: 09/10/22  4:22 PM  Result Value Ref Range   WBC 10.6 (H) 4.0 - 10.5 K/uL   RBC 4.57 3.87 - 5.11 MIL/uL   Hemoglobin 14.7 12.0 - 15.0 g/dL   HCT 44.0 36.0 - 46.0 %   MCV 96.3 80.0 - 100.0 fL   MCH 32.2 26.0 - 34.0 pg   MCHC 33.4 30.0 - 36.0 g/dL   RDW 12.6 11.5 - 15.5 %   Platelets 245 150 - 400 K/uL   nRBC 0.0 0.0 - 0.2 %    Comment: Performed at [facility], [address] [place], [place] [address]  Urinalysis, Routine w reflex microscopic     Status: Abnormal   Collection Time: 09/10/22  9:03 PM  Result Value Ref Range   Color, Urine YELLOW YELLOW   APPearance CLEAR CLEAR   Specific Gravity, Urine 1.036 (H) 1.005 - 1.030   pH 6.0 5.0 - 8.0   Glucose, UA NEGATIVE NEGATIVE mg/dL   Hgb urine dipstick NEGATIVE NEGATIVE   Bilirubin Urine NEGATIVE NEGATIVE   Ketones, ur TRACE (A) NEGATIVE mg/dL   Protein, ur NEGATIVE NEGATIVE mg/dL   Nitrite NEGATIVE NEGATIVE   Leukocytes,Ua NEGATIVE NEGATIVE    Comment: Performed at [facility], [address] [place], [place] [address]   Pregnancy, urine     Status: None   Collection Time: 09/10/22  9:03 PM  Result Value Ref Range   Preg Test, Ur NEGATIVE NEGATIVE    Comment:        THE SENSITIVITY OF THIS METHODOLOGY IS >20 mIU/mL. Performed at [facility], [address] [place], [place] [address]     DG Abd Portable 1 View  Result Date: 09/11/2022 CLINICAL DATA:  Nasogastric tube placement. EXAM: PORTABLE ABDOMEN - 1 VIEW COMPARISON:  July 05, 2019 FINDINGS: Nasogastric tube is seen with its distal tip noted within the body of the stomach. Its distal side hole sits proximal to the expected region of the gastroesophageal junction. The bowel gas pattern is normal. No radio-opaque calculi or other significant radiographic abnormality are seen. IMPRESSION: Nasogastric tube positioning, as described above. Electronically Signed   By: Virgina Norfolk M.D.   On: 09/11/2022 01:33   CT Abdomen Pelvis W Contrast  Result Date: 09/10/2022 CLINICAL DATA:  Left lower quadrant abdominal pain. EXAM: CT ABDOMEN AND PELVIS WITH CONTRAST TECHNIQUE: Multidetector CT imaging of the abdomen and pelvis was performed using the standard protocol following bolus administration of intravenous contrast. RADIATION DOSE REDUCTION: This exam was performed according to the departmental dose-optimization program which includes automated exposure control, adjustment of the mA and/or kV according to patient size and/or use of iterative reconstruction technique. CONTRAST:  147m OMNIPAQUE IOHEXOL 300 MG/ML  SOLN COMPARISON:  CT abdomen pelvis dated 06/05/2022. FINDINGS: Lower chest: The visualized lung bases are clear. No intra-abdominal free air or free fluid. Hepatobiliary: The liver is unremarkable. No biliary dilatation. Cholecystectomy. No retained calcified stone noted in the central CBD. Pancreas: Unremarkable. No pancreatic ductal dilatation or surrounding inflammatory changes. Spleen: Normal  in size without focal  abnormality. Adrenals/Urinary Tract: The adrenal glands are unremarkable. Subcentimeter right renal interpolar hypodense lesion is too small to characterize but similar to prior CT, likely a cyst. No focal. There is no hydronephrosis on either side. The visualized ureters and urinary bladder appear unremarkable. Stomach/Bowel: Evaluation of the bowel is limited in the absence of or contrast. There is a cluster of small bowel loops in the right hemipelvis abutting the right bladder dome and vaginal cuff most consistent with adhesions. Several collapsed segments of the small bowel in the right hemipelvis may be related to peristalsis. A degree of stricture is not excluded. The bowel loops measure up to approximately 2.5 cm in caliber. Small-bowel series study may provide better evaluation if there is clinical concern for developing obstruction. The appendix is normal. Vascular/Lymphatic: The abdominal aorta and IVC are unremarkable. No portal venous gas. There is no adenopathy. Reproductive: Hysterectomy.  No adnexal masses. Other: Midline vertical anterior pelvic wall incisional scar. Musculoskeletal: No acute or significant osseous findings. IMPRESSION: Findings of small-bowel adhesions in the right hemipelvis. Top-normal caliber small bowel loops in the right lower quadrant may be physiologic. A partial obstruction or stricture is not entirely excluded. Small-bowel series may provide better evaluation if there is clinical concern for developing obstruction. Electronically Signed   By: Anner Crete M.D.   On: 09/10/2022 21:04    Review of Systems  All other systems reviewed and are negative.  Blood pressure 97/62, pulse 70, temperature 98.3 F (36.8 C), temperature source Oral, resp. rate 18, height '5\' 7"'$  (1.702 m), weight 82.1 kg, last menstrual period 01/01/2004, SpO2 100 %. Physical Exam Constitutional:      Appearance: She is well-developed and normal weight. She is not ill-appearing or diaphoretic.   HENT:     Head: Normocephalic and atraumatic.  Cardiovascular:     Rate and Rhythm: Normal rate and regular rhythm.  Pulmonary:     Effort: Pulmonary effort is normal. No respiratory distress.  Abdominal:     Comments: Abdomen is distended with mild diffuse tenderness.  She has multiple well-healed incisions.  There is currently no obvious hernia.  Skin:    General: Skin is warm and dry.  Neurological:     General: No focal deficit present.     Mental Status: She is alert.  Psychiatric:        Behavior: Behavior normal.     Assessment/Plan: Small bowel obstruction  The SBO is mildly evident on her CT scans but her symptoms are consistent with a partial obstruction versus an ileus versus gastroenteritis or some other cause.  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  She currently has a nasogastric tube in place and the small bowel protocol has been ordered.  Hopefully this will demonstrate whether or not there is indeed an obstruction that would necessitate surgical intervention.  If the protocol shows no obstruction, we will remove her nasogastric tube and start her on liquids.  We will then have to consider a GI and/or GYN consult given her prior history.  As her most recent surgeries and extensive GI work-up have been done at [facility], consideration should be made for transfer to their facilities unless she improves enough to do this as an outpatient. We will follow with you.  Moderately complex medical decision making  Coralie Keens 09/11/2022, 3:51 PM

## 2022-09-12 DIAGNOSIS — K56600 Partial intestinal obstruction, unspecified as to cause: Secondary | ICD-10-CM | POA: Diagnosis not present

## 2022-09-12 DIAGNOSIS — K5669 Other partial intestinal obstruction: Secondary | ICD-10-CM | POA: Diagnosis not present

## 2022-09-12 LAB — COMPREHENSIVE METABOLIC PANEL
ALT: 19 U/L (ref 0–44)
AST: 16 U/L (ref 15–41)
Albumin: 3.3 g/dL — ABNORMAL LOW (ref 3.5–5.0)
Alkaline Phosphatase: 67 U/L (ref 38–126)
Anion gap: 9 (ref 5–15)
BUN: 11 mg/dL (ref 6–20)
CO2: 18 mmol/L — ABNORMAL LOW (ref 22–32)
Calcium: 7.8 mg/dL — ABNORMAL LOW (ref 8.9–10.3)
Chloride: 111 mmol/L (ref 98–111)
Creatinine, Ser: 0.49 mg/dL (ref 0.44–1.00)
GFR, Estimated: >60 mL/min (ref >60)
Glucose, Bld: 55 mg/dL — ABNORMAL LOW (ref 70–99)
Potassium: 3.7 mmol/L (ref 3.5–5.1)
Sodium: 138 mmol/L (ref 135–145)
Total Bilirubin: 1.5 mg/dL — ABNORMAL HIGH (ref 0.3–1.2)
Total Protein: 5.5 g/dL — ABNORMAL LOW (ref 6.5–8.1)

## 2022-09-12 LAB — CBC
HCT: 38.8 % (ref 36.0–46.0)
Hemoglobin: 12.8 g/dL (ref 12.0–15.0)
MCH: 32.6 pg (ref 26.0–34.0)
MCHC: 33 g/dL (ref 30.0–36.0)
MCV: 98.7 fL (ref 80.0–100.0)
Platelets: 199 10*3/uL (ref 150–400)
RBC: 3.93 MIL/uL (ref 3.87–5.11)
RDW: 12.2 % (ref 11.5–15.5)
WBC: 7.2 10*3/uL (ref 4.0–10.5)
nRBC: 0 % (ref 0.0–0.2)

## 2022-09-12 MED ORDER — CLONAZEPAM 0.5 MG PO TABS
0.5000 mg | ORAL_TABLET | Freq: Two times a day (BID) | ORAL | Status: DC
  Administered 2022-09-12 – 2022-09-14 (×5): 0.5 mg via ORAL
  Filled 2022-09-12 (×5): qty 1

## 2022-09-12 MED ORDER — HYDROMORPHONE HCL 1 MG/ML IJ SOLN
0.5000 mg | INTRAMUSCULAR | Status: DC | PRN
  Administered 2022-09-12 – 2022-09-14 (×13): 0.5 mg via INTRAVENOUS
  Filled 2022-09-12 (×14): qty 0.5

## 2022-09-12 MED ORDER — ESCITALOPRAM OXALATE 20 MG PO TABS
30.0000 mg | ORAL_TABLET | Freq: Every day | ORAL | Status: DC
  Administered 2022-09-12 – 2022-09-13 (×2): 30 mg via ORAL
  Filled 2022-09-12 (×2): qty 2

## 2022-09-12 NOTE — Progress Notes (Signed)
PROGRESS NOTE    Gina [name]  JKD:[id number] DOB: 08-Jul-1976 DOA: 09/10/2022 PCP: Velna Hatchet, MD    Brief Narrative:   Gina [name] is a 46 y.o. female with past medical history significant for fibromyalgia, bipolar disorder, endometriosis, multiple intra-abdominal surgical interventions with cholecystectomy and hernia repair who presented to [place] by direction of her OB/GYN on 11/10 with 2-day history of nausea/vomiting and abdominal pain with associated distention.  Additionally patient denies passing any flatus and no bowel movement for 2 days as well; normally she has diarrhea at baseline.  GYN concern for possible diverticulitis.  Denies fever or sick contacts, did not have any pain medications at home but did try Phenergan that did provide some relief.  Further denies any vaginal discharge.  In the ED, temperature 98.3 F, HR 76, RR 19, BP 114/68, SPO2 96% on room air.  WBC 10.6, hemoglobin 14.7, platelets 245.  Sodium 139, potassium 3.8, chloride 104, CO2 24, glucose 81, BUN 9, creatinine 0.60.  Lipase less than 10.  AST 18, ALT 21, total bilirubin 0.5.  Urinalysis with trace ketones, negative leukocytes, negative nitrate.  hCG negative.  CT abdomen/pelvis with findings of small bowel adhesions in the right hemipelvis with possible partial obstruction or stricture.  General surgery was consulted.  NG tube was placed.  Patient was transferred to [facility] under the hospital service for further evaluation and management of small bowel obstruction.  Assessment & Plan:   Small bowel obstruction Patient presenting to ED with 2-day history of nausea/vomiting associated with abdominal pain.  Imaging notable for partial small bowel obstruction.  Has extensive surgical history with previous hernia repairs, cholecystectomy and multiple surgeries for endometriosis with adhesions likely causing obstruction. -- General surgery following, appreciate  assistance -- NG tube with 1 L output past 24 hours -- Symptoms improving, x-ray now shows contrast within the colon and general surgery discontinue NG tube and starting on clear liquid diet today -- Continue NS at 100 mL/h until demonstrates tolerates oral intake -- Repeat abdominal x-ray this afternoon -- Supportive care, antiemetics -- Further per general surgery  Bipolar disorder Fibromyalgia Depression Resume home clonazepam 0.5 mg p.o. twice daily, Lexapro 30 mg p.o. nightly  DVT prophylaxis: enoxaparin (LOVENOX) injection 40 mg Start: 09/11/22 1800    Code Status: Full Code Family Communication: Updated spouse present at bedside this morning  Disposition Plan:  Level of care: Med-Surg Status is: Observation The patient remains OBS appropriate and will d/c before 2 midnights.    Consultants:  General surgery, Dr. Ninfa Linden  Procedures:  None  Antimicrobials:  None   Subjective: Patient seen examined bedside, resting comfortably.  Spouse present.  Patient requesting water, ice chips this morning.  Reports abdominal pain improved.  Seen by general surgery this morning and given the advancement of contrast into the colon on x-ray we will discontinue NG tube and start clear liquid diet today.  Patient with no other complaints or concerns at this time.  Patient denies headache, dizziness, no chest pain, no shortness of breath, no cough/congestion, no focal weakness, no fatigue.  No acute events overnight per nursing staff.  Objective: Vitals:   09/11/22 1920 09/11/22 2256 09/12/22 0310 09/12/22 0734  BP: 108/66 108/71 116/74 108/60  Pulse: 79 87 83 76  Resp: '18 16 16 16  '$ Temp: 98.2 F (36.8 C) 98.2 F (36.8 C) 98.1 F (36.7 C) 98.3 F (36.8 C)  TempSrc: Oral Oral Oral Oral  SpO2: 98% 94% 99% 94%  Weight:      Height:        Intake/Output Summary (Last 24 hours) at 09/12/2022 1019 Last data filed at 09/12/2022 0700 Gross per 24 hour  Intake 1543.06 ml  Output  1000 ml  Net 543.06 ml   Filed Weights   09/10/22 1422  Weight: 82.1 kg    Examination:  Physical Exam: GEN: NAD, alert and oriented x 3, wd/wn HEENT: NCAT, PERRL, EOMI, sclera clear, MMM, NG tube noted in place with bilious drainage noted in collection canister PULM: CTAB w/o wheezes/crackles, normal respiratory effort CV: RRR w/o M/G/R GI: abd soft, NTND, faint/dissidence bowel sounds, no R/G/M MSK: no peripheral edema, muscle strength globally intact 5/5 bilateral upper/lower extremities NEURO: CN II-XII intact, no focal deficits, sensation to light touch intact PSYCH: normal mood/affect Integumentary: dry/intact, no rashes or wounds    Data Reviewed: I have personally reviewed following labs and imaging studies  CBC: Recent Labs  Lab 09/10/22 1622 09/11/22 1626 09/12/22 0522  WBC 10.6* 8.8 7.2  NEUTROABS  --  6.1  --   HGB 14.7 13.6 12.8  HCT 44.0 41.1 38.8  MCV 96.3 98.3 98.7  PLT 245 218 400   Basic Metabolic Panel: Recent Labs  Lab 09/10/22 1622 09/11/22 1626 09/12/22 0522  NA 139 137 138  K 3.8 3.5 3.7  CL 104 108 111  CO2 24 22 18*  GLUCOSE 81 72 55*  BUN '9 8 11  '$ CREATININE 0.60 0.50 0.49  CALCIUM 9.1 7.9* 7.8*   GFR: Estimated Creatinine Clearance: 98.9 mL/min (by C-G formula based on SCr of 0.49 mg/dL). Liver Function Tests: Recent Labs  Lab 09/10/22 1622 09/11/22 1626 09/12/22 0522  AST '18 19 16  '$ ALT '21 23 19  '$ ALKPHOS 77 70 67  BILITOT 0.5 1.2 1.5*  PROT 6.7 6.1* 5.5*  ALBUMIN 4.3 3.4* 3.3*   Recent Labs  Lab 09/10/22 1622  LIPASE <10*   No results for input(s): "AMMONIA" in the last 168 hours. Coagulation Profile: No results for input(s): "INR", "PROTIME" in the last 168 hours. Cardiac Enzymes: No results for input(s): "CKTOTAL", "CKMB", "CKMBINDEX", "TROPONINI" in the last 168 hours. BNP (last 3 results) No results for input(s): "PROBNP" in the last 8760 hours. HbA1C: No results for input(s): "HGBA1C" in the last 72  hours. CBG: No results for input(s): "GLUCAP" in the last 168 hours. Lipid Profile: No results for input(s): "CHOL", "HDL", "LDLCALC", "TRIG", "CHOLHDL", "LDLDIRECT" in the last 72 hours. Thyroid Function Tests: No results for input(s): "TSH", "T4TOTAL", "FREET4", "T3FREE", "THYROIDAB" in the last 72 hours. Anemia Panel: No results for input(s): "VITAMINB12", "FOLATE", "FERRITIN", "TIBC", "IRON", "RETICCTPCT" in the last 72 hours. Sepsis Labs: No results for input(s): "PROCALCITON", "LATICACIDVEN" in the last 168 hours.  No results found for this or any previous visit (from the past 240 hour(s)).       Radiology Studies: DG Abd Portable 1V-Small Bowel Obstruction Protocol-initial, 8 hr delay  Result Date: 09/12/2022 CLINICAL DATA:  Eighty however delay image small-bowel approval. EXAM: PORTABLE ABDOMEN - 1 VIEW COMPARISON:  CT abdomen pelvis dated 09/10/2022. FINDINGS: Oral contrast has traversed into the colon. No bowel dilatation or evidence of obstruction. No free air. Right upper quadrant cholecystectomy clips. The osseous structures are intact. The soft tissues are unremarkable. IMPRESSION: Nonobstructive bowel gas pattern. Electronically Signed   By: Anner Crete M.D.   On: 09/12/2022 00:07   DG Abd Portable 1 View  Result Date: 09/11/2022 CLINICAL DATA:  Nasogastric tube placement. EXAM:  PORTABLE ABDOMEN - 1 VIEW COMPARISON:  July 05, 2019 FINDINGS: Nasogastric tube is seen with its distal tip noted within the body of the stomach. Its distal side hole sits proximal to the expected region of the gastroesophageal junction. The bowel gas pattern is normal. No radio-opaque calculi or other significant radiographic abnormality are seen. IMPRESSION: Nasogastric tube positioning, as described above. Electronically Signed   By: Virgina Norfolk M.D.   On: 09/11/2022 01:33   CT Abdomen Pelvis W Contrast  Result Date: 09/10/2022 CLINICAL DATA:  Left lower quadrant abdominal  pain. EXAM: CT ABDOMEN AND PELVIS WITH CONTRAST TECHNIQUE: Multidetector CT imaging of the abdomen and pelvis was performed using the standard protocol following bolus administration of intravenous contrast. RADIATION DOSE REDUCTION: This exam was performed according to the departmental dose-optimization program which includes automated exposure control, adjustment of the mA and/or kV according to patient size and/or use of iterative reconstruction technique. CONTRAST:  143m OMNIPAQUE IOHEXOL 300 MG/ML  SOLN COMPARISON:  CT abdomen pelvis dated 06/05/2022. FINDINGS: Lower chest: The visualized lung bases are clear. No intra-abdominal free air or free fluid. Hepatobiliary: The liver is unremarkable. No biliary dilatation. Cholecystectomy. No retained calcified stone noted in the central CBD. Pancreas: Unremarkable. No pancreatic ductal dilatation or surrounding inflammatory changes. Spleen: Normal in size without focal abnormality. Adrenals/Urinary Tract: The adrenal glands are unremarkable. Subcentimeter right renal interpolar hypodense lesion is too small to characterize but similar to prior CT, likely a cyst. No focal. There is no hydronephrosis on either side. The visualized ureters and urinary bladder appear unremarkable. Stomach/Bowel: Evaluation of the bowel is limited in the absence of or contrast. There is a cluster of small bowel loops in the right hemipelvis abutting the right bladder dome and vaginal cuff most consistent with adhesions. Several collapsed segments of the small bowel in the right hemipelvis may be related to peristalsis. A degree of stricture is not excluded. The bowel loops measure up to approximately 2.5 cm in caliber. Small-bowel series study may provide better evaluation if there is clinical concern for developing obstruction. The appendix is normal. Vascular/Lymphatic: The abdominal aorta and IVC are unremarkable. No portal venous gas. There is no adenopathy. Reproductive:  Hysterectomy.  No adnexal masses. Other: Midline vertical anterior pelvic wall incisional scar. Musculoskeletal: No acute or significant osseous findings. IMPRESSION: Findings of small-bowel adhesions in the right hemipelvis. Top-normal caliber small bowel loops in the right lower quadrant may be physiologic. A partial obstruction or stricture is not entirely excluded. Small-bowel series may provide better evaluation if there is clinical concern for developing obstruction. Electronically Signed   By: AAnner CreteM.D.   On: 09/10/2022 21:04        Scheduled Meds:  enoxaparin (LOVENOX) injection  40 mg Subcutaneous Q24H   pantoprazole (PROTONIX) IV  40 mg Intravenous Q24H   Continuous Infusions:  sodium chloride 100 mL/hr at 09/12/22 0152   promethazine (PHENERGAN) injection (IM or IVPB) 12.5 mg (09/12/22 0823)     LOS: 0 days    Time spent: 51 minutes spent on chart review, discussion with nursing staff, consultants, updating family and interview/physical exam; more than 50% of that time was spent in counseling and/or coordination of care.    Ander Wamser J [place] DO [facility] Available via Epic secure chat 7am-7pm After these hours, please refer to coverage provider listed on [email or web address] 09/12/2022, 10:19 AM

## 2022-09-12 NOTE — Progress Notes (Signed)
Subjective/Chief Complaint: Reports minimal abdominal pain this morning   Objective: Vital signs in last 24 hours: Temp:  [98.1 F (36.7 C)-98.3 F (36.8 C)] 98.3 F (36.8 C) (11/12 0734) Pulse Rate:  [70-87] 76 (11/12 0734) Resp:  [15-18] 16 (11/12 0734) BP: (97-116)/(60-93) 108/60 (11/12 0734) SpO2:  [88 %-100 %] 94 % (11/12 0734) Last BM Date : 09/09/22  Intake/Output from previous day: 11/11 0701 - 11/12 0700 In: 1543.1 [I.V.:1490.2; IV Piggyback:52.9] Out: 1000 [Emesis/NG output:1000] Intake/Output this shift: No intake/output data recorded.  Exam: Awake and alert Looks comfortable Abdomen soft, non-distended, minimally tender  Lab Results:  Recent Labs    09/11/22 1626 09/12/22 0522  WBC 8.8 7.2  HGB 13.6 12.8  HCT 41.1 38.8  PLT 218 199   BMET Recent Labs    09/11/22 1626 09/12/22 0522  NA 137 138  K 3.5 3.7  CL 108 111  CO2 22 18*  GLUCOSE 72 55*  BUN 8 11  CREATININE 0.50 0.49  CALCIUM 7.9* 7.8*   PT/INR No results for input(s): "LABPROT", "INR" in the last 72 hours. ABG No results for input(s): "PHART", "HCO3" in the last 72 hours.  Invalid input(s): "PCO2", "PO2"  Studies/Results: DG Abd Portable 1V-Small Bowel Obstruction Protocol-initial, 8 hr delay  Result Date: 09/12/2022 CLINICAL DATA:  Eighty however delay image small-bowel approval. EXAM: PORTABLE ABDOMEN - 1 VIEW COMPARISON:  CT abdomen pelvis dated 09/10/2022. FINDINGS: Oral contrast has traversed into the colon. No bowel dilatation or evidence of obstruction. No free air. Right upper quadrant cholecystectomy clips. The osseous structures are intact. The soft tissues are unremarkable. IMPRESSION: Nonobstructive bowel gas pattern. Electronically Signed   By: Anner Crete M.D.   On: 09/12/2022 00:07   DG Abd Portable 1 View  Result Date: 09/11/2022 CLINICAL DATA:  Nasogastric tube placement. EXAM: PORTABLE ABDOMEN - 1 VIEW COMPARISON:  July 05, 2019 FINDINGS:  Nasogastric tube is seen with its distal tip noted within the body of the stomach. Its distal side hole sits proximal to the expected region of the gastroesophageal junction. The bowel gas pattern is normal. No radio-opaque calculi or other significant radiographic abnormality are seen. IMPRESSION: Nasogastric tube positioning, as described above. Electronically Signed   By: Virgina Norfolk M.D.   On: 09/11/2022 01:33   CT Abdomen Pelvis W Contrast  Result Date: 09/10/2022 CLINICAL DATA:  Left lower quadrant abdominal pain. EXAM: CT ABDOMEN AND PELVIS WITH CONTRAST TECHNIQUE: Multidetector CT imaging of the abdomen and pelvis was performed using the standard protocol following bolus administration of intravenous contrast. RADIATION DOSE REDUCTION: This exam was performed according to the departmental dose-optimization program which includes automated exposure control, adjustment of the mA and/or kV according to patient size and/or use of iterative reconstruction technique. CONTRAST:  182m OMNIPAQUE IOHEXOL 300 MG/ML  SOLN COMPARISON:  CT abdomen pelvis dated 06/05/2022. FINDINGS: Lower chest: The visualized lung bases are clear. No intra-abdominal free air or free fluid. Hepatobiliary: The liver is unremarkable. No biliary dilatation. Cholecystectomy. No retained calcified stone noted in the central CBD. Pancreas: Unremarkable. No pancreatic ductal dilatation or surrounding inflammatory changes. Spleen: Normal in size without focal abnormality. Adrenals/Urinary Tract: The adrenal glands are unremarkable. Subcentimeter right renal interpolar hypodense lesion is too small to characterize but similar to prior CT, likely a cyst. No focal. There is no hydronephrosis on either side. The visualized ureters and urinary bladder appear unremarkable. Stomach/Bowel: Evaluation of the bowel is limited in the absence of or contrast. There is a  cluster of small bowel loops in the right hemipelvis abutting the right bladder  dome and vaginal cuff most consistent with adhesions. Several collapsed segments of the small bowel in the right hemipelvis may be related to peristalsis. A degree of stricture is not excluded. The bowel loops measure up to approximately 2.5 cm in caliber. Small-bowel series study may provide better evaluation if there is clinical concern for developing obstruction. The appendix is normal. Vascular/Lymphatic: The abdominal aorta and IVC are unremarkable. No portal venous gas. There is no adenopathy. Reproductive: Hysterectomy.  No adnexal masses. Other: Midline vertical anterior pelvic wall incisional scar. Musculoskeletal: No acute or significant osseous findings. IMPRESSION: Findings of small-bowel adhesions in the right hemipelvis. Top-normal caliber small bowel loops in the right lower quadrant may be physiologic. A partial obstruction or stricture is not entirely excluded. Small-bowel series may provide better evaluation if there is clinical concern for developing obstruction. Electronically Signed   By: Anner Crete M.D.   On: 09/10/2022 21:04    Anti-infectives: Anti-infectives (From admission, onward)    None       Assessment/Plan: SBO  Small bowel protocol shows contrast throughout the colon and no dilated small bowel. Will d/c the NG and start po  Patient agrees with the plans    Coralie Keens MD 09/12/2022

## 2022-09-13 DIAGNOSIS — Z888 Allergy status to other drugs, medicaments and biological substances status: Secondary | ICD-10-CM | POA: Diagnosis not present

## 2022-09-13 DIAGNOSIS — M797 Fibromyalgia: Secondary | ICD-10-CM | POA: Diagnosis present

## 2022-09-13 DIAGNOSIS — F3162 Bipolar disorder, current episode mixed, moderate: Secondary | ICD-10-CM | POA: Diagnosis present

## 2022-09-13 DIAGNOSIS — K529 Noninfective gastroenteritis and colitis, unspecified: Secondary | ICD-10-CM | POA: Diagnosis present

## 2022-09-13 DIAGNOSIS — Z8249 Family history of ischemic heart disease and other diseases of the circulatory system: Secondary | ICD-10-CM | POA: Diagnosis not present

## 2022-09-13 DIAGNOSIS — G8929 Other chronic pain: Secondary | ICD-10-CM | POA: Diagnosis present

## 2022-09-13 DIAGNOSIS — F17210 Nicotine dependence, cigarettes, uncomplicated: Secondary | ICD-10-CM | POA: Diagnosis present

## 2022-09-13 DIAGNOSIS — K56600 Partial intestinal obstruction, unspecified as to cause: Secondary | ICD-10-CM | POA: Diagnosis present

## 2022-09-13 DIAGNOSIS — Z882 Allergy status to sulfonamides status: Secondary | ICD-10-CM | POA: Diagnosis not present

## 2022-09-13 DIAGNOSIS — K219 Gastro-esophageal reflux disease without esophagitis: Secondary | ICD-10-CM | POA: Diagnosis present

## 2022-09-13 DIAGNOSIS — Z90711 Acquired absence of uterus with remaining cervical stump: Secondary | ICD-10-CM | POA: Diagnosis not present

## 2022-09-13 DIAGNOSIS — K5651 Intestinal adhesions [bands], with partial obstruction: Secondary | ICD-10-CM | POA: Diagnosis present

## 2022-09-13 DIAGNOSIS — K56609 Unspecified intestinal obstruction, unspecified as to partial versus complete obstruction: Secondary | ICD-10-CM | POA: Diagnosis present

## 2022-09-13 DIAGNOSIS — Z880 Allergy status to penicillin: Secondary | ICD-10-CM | POA: Diagnosis not present

## 2022-09-13 DIAGNOSIS — Z818 Family history of other mental and behavioral disorders: Secondary | ICD-10-CM | POA: Diagnosis not present

## 2022-09-13 DIAGNOSIS — Z79899 Other long term (current) drug therapy: Secondary | ICD-10-CM | POA: Diagnosis not present

## 2022-09-13 DIAGNOSIS — M069 Rheumatoid arthritis, unspecified: Secondary | ICD-10-CM | POA: Diagnosis present

## 2022-09-13 DIAGNOSIS — Z8261 Family history of arthritis: Secondary | ICD-10-CM | POA: Diagnosis not present

## 2022-09-13 DIAGNOSIS — Z885 Allergy status to narcotic agent status: Secondary | ICD-10-CM | POA: Diagnosis not present

## 2022-09-13 DIAGNOSIS — Z9049 Acquired absence of other specified parts of digestive tract: Secondary | ICD-10-CM | POA: Diagnosis not present

## 2022-09-13 DIAGNOSIS — Z83438 Family history of other disorder of lipoprotein metabolism and other lipidemia: Secondary | ICD-10-CM | POA: Diagnosis not present

## 2022-09-13 DIAGNOSIS — Z87440 Personal history of urinary (tract) infections: Secondary | ICD-10-CM | POA: Diagnosis not present

## 2022-09-13 MED ORDER — HYDROMORPHONE HCL 1 MG/ML IJ SOLN
0.5000 mg | Freq: Once | INTRAMUSCULAR | Status: AC | PRN
  Administered 2022-09-13: 0.5 mg via INTRAVENOUS
  Filled 2022-09-13: qty 0.5

## 2022-09-13 NOTE — Progress Notes (Signed)
Patient complaining of abdominal 10 out 10. I offered heating pad but she refused. NP Chriss Driver because patient wants dose increased.

## 2022-09-13 NOTE — Progress Notes (Signed)
PROGRESS NOTE    Gina [name]  OEV:[id number] DOB: [birth year] DOA: 09/10/2022 PCP: Velna Hatchet, MD    Brief Narrative:   Gina [name] is a 46 y.o. female with past medical history significant for fibromyalgia, bipolar disorder, endometriosis, multiple intra-abdominal surgical interventions with cholecystectomy and hernia repair who presented to [facility] by direction of her OB/GYN on 11/10 with 2-day history of nausea/vomiting and abdominal pain with associated distention.  Additionally patient denies passing any flatus and no bowel movement for 2 days as well; normally she has diarrhea at baseline.  GYN concern for possible diverticulitis.  Denies fever or sick contacts, did not have any pain medications at home but did try Phenergan that did provide some relief.  Further denies any vaginal discharge.  In the ED, temperature 98.3 F, HR 76, RR 19, BP 114/68, SPO2 96% on room air.  WBC 10.6, hemoglobin 14.7, platelets 245.  Sodium 139, potassium 3.8, chloride 104, CO2 24, glucose 81, BUN 9, creatinine 0.60.  Lipase less than 10.  AST 18, ALT 21, total bilirubin 0.5.  Urinalysis with trace ketones, negative leukocytes, negative nitrate.  hCG negative.  CT abdomen/pelvis with findings of small bowel adhesions in the right hemipelvis with possible partial obstruction or stricture.  General surgery was consulted.  NG tube was placed.  Patient was transferred to [facility] under the hospital service for further evaluation and management of small bowel obstruction.  Assessment & Plan:   Small bowel obstruction Patient presenting to ED with 2-day history of nausea/vomiting associated with abdominal pain.  Imaging notable for partial small bowel obstruction.  Has extensive surgical history with previous hernia repairs, cholecystectomy and multiple surgeries for endometriosis with adhesions likely causing obstruction.  NG tube was initially placed for bowel  rest/decompression and subsequently was discontinued on 09/12/2022. -- General surgery following, appreciate assistance -- Symptoms improving, x-ray now shows contrast within the colon -- Started on full liquid diet today  -- Supportive care, antiemetics -- Further per general surgery  Bipolar disorder Fibromyalgia Depression Continue home clonazepam 0.5 mg p.o. twice daily, Lexapro 30 mg p.o. nightly  DVT prophylaxis: enoxaparin (LOVENOX) injection 40 mg Start: 09/11/22 1800    Code Status: Full Code Family Communication: Updated spouse present at bedside this morning  Disposition Plan:  Level of care: Med-Surg Status is: Inpatient Remains inpatient appropriate because: Further advancement of diet, awaiting general surgery sign off.  Possible discharge later today versus tomorrow      Consultants:  General surgery, Dr. Ninfa Linden  Procedures:  None  Antimicrobials:  None   Subjective: Patient seen examined bedside, resting comfortably.  Spouse present.  Seen by general surgery this morning surrounding full liquid diet.  Continues with abdominal distention but pain much improved.  Patient with no other complaints or concerns at this time.  Patient denies headache, dizziness, no chest pain, no shortness of breath, no cough/congestion, no focal weakness, no fatigue.  No acute events overnight per nursing staff.  Objective: Vitals:   09/12/22 1122 09/12/22 1326 09/12/22 2152 09/13/22 0518  BP: 116/80 108/73 103/68 107/60  Pulse: (!) 106 (!) 104 91 86  Resp: '16 16 16 16  '$ Temp: 98.2 F (36.8 C)  98.6 F (37 C) 98 F (36.7 C)  TempSrc: Oral  Oral Oral  SpO2: 98% 98% 99% 97%  Weight:      Height:        Intake/Output Summary (Last 24 hours) at 09/13/2022 1225 Last data filed at 09/13/2022 [id number]  Gross per 24 hour  Intake 2259.54 ml  Output --  Net 2259.54 ml   Filed Weights   09/10/22 1422  Weight: 82.1 kg    Examination:  Physical Exam: GEN: NAD, alert and  oriented x 3, wd/wn HEENT: NCAT, PERRL, EOMI, sclera clear, MMM,  PULM: CTAB w/o wheezes/crackles, normal respiratory effort CV: RRR w/o M/G/R GI: abd soft, NT, + mild distention, + BS, no R/G/M MSK: no peripheral edema, muscle strength globally intact 5/5 bilateral upper/lower extremities NEURO: CN II-XII intact, no focal deficits, sensation to light touch intact PSYCH: normal mood/affect Integumentary: dry/intact, no rashes or wounds    Data Reviewed: I have personally reviewed following labs and imaging studies  CBC: Recent Labs  Lab 09/10/22 1622 09/11/22 1626 09/12/22 0522  WBC 10.6* 8.8 7.2  NEUTROABS  --  6.1  --   HGB 14.7 13.6 12.8  HCT 44.0 41.1 38.8  MCV 96.3 98.3 98.7  PLT 245 218 854   Basic Metabolic Panel: Recent Labs  Lab 09/10/22 1622 09/11/22 1626 09/12/22 0522  NA 139 137 138  K 3.8 3.5 3.7  CL 104 108 111  CO2 24 22 18*  GLUCOSE 81 72 55*  BUN '9 8 11  '$ CREATININE 0.60 0.50 0.49  CALCIUM 9.1 7.9* 7.8*   GFR: Estimated Creatinine Clearance: 98.9 mL/min (by C-G formula based on SCr of 0.49 mg/dL). Liver Function Tests: Recent Labs  Lab 09/10/22 1622 09/11/22 1626 09/12/22 0522  AST '18 19 16  '$ ALT '21 23 19  '$ ALKPHOS 77 70 67  BILITOT 0.5 1.2 1.5*  PROT 6.7 6.1* 5.5*  ALBUMIN 4.3 3.4* 3.3*   Recent Labs  Lab 09/10/22 1622  LIPASE <10*   No results for input(s): "AMMONIA" in the last 168 hours. Coagulation Profile: No results for input(s): "INR", "PROTIME" in the last 168 hours. Cardiac Enzymes: No results for input(s): "CKTOTAL", "CKMB", "CKMBINDEX", "TROPONINI" in the last 168 hours. BNP (last 3 results) No results for input(s): "PROBNP" in the last 8760 hours. HbA1C: No results for input(s): "HGBA1C" in the last 72 hours. CBG: No results for input(s): "GLUCAP" in the last 168 hours. Lipid Profile: No results for input(s): "CHOL", "HDL", "LDLCALC", "TRIG", "CHOLHDL", "LDLDIRECT" in the last 72 hours. Thyroid Function Tests: No  results for input(s): "TSH", "T4TOTAL", "FREET4", "T3FREE", "THYROIDAB" in the last 72 hours. Anemia Panel: No results for input(s): "VITAMINB12", "FOLATE", "FERRITIN", "TIBC", "IRON", "RETICCTPCT" in the last 72 hours. Sepsis Labs: No results for input(s): "PROCALCITON", "LATICACIDVEN" in the last 168 hours.  No results found for this or any previous visit (from the past 240 hour(s)).       Radiology Studies: DG Abd Portable 1V-Small Bowel Obstruction Protocol-initial, 8 hr delay  Result Date: 09/12/2022 CLINICAL DATA:  Eighty however delay image small-bowel approval. EXAM: PORTABLE ABDOMEN - 1 VIEW COMPARISON:  CT abdomen pelvis dated 09/10/2022. FINDINGS: Oral contrast has traversed into the colon. No bowel dilatation or evidence of obstruction. No free air. Right upper quadrant cholecystectomy clips. The osseous structures are intact. The soft tissues are unremarkable. IMPRESSION: Nonobstructive bowel gas pattern. Electronically Signed   By: Anner Crete M.D.   On: 09/12/2022 00:07        Scheduled Meds:  clonazePAM  0.5 mg Oral BID   enoxaparin (LOVENOX) injection  40 mg Subcutaneous Q24H   escitalopram  30 mg Oral QHS   pantoprazole (PROTONIX) IV  40 mg Intravenous Q24H   Continuous Infusions:  sodium chloride 100 mL/hr at 09/12/22 2202  promethazine (PHENERGAN) injection (IM or IVPB) 12.5 mg (09/13/22 0548)     LOS: 0 days    Time spent: 51 minutes spent on chart review, discussion with nursing staff, consultants, updating family and interview/physical exam; more than 50% of that time was spent in counseling and/or coordination of care.    Joseff Luckman J [place] DO [facility] Available via Epic secure chat 7am-7pm After these hours, please refer to coverage provider listed on [email or web address] 09/13/2022, 12:25 PM

## 2022-09-13 NOTE — Progress Notes (Signed)
[facility] Progress Note     Subjective: CC-  Initially tolerated clear liquids yesterday then had recurrence of her abdominal pain and nausea last night. No emesis. Passing some flatus, no BM. Pain somewhat improved this morning. Asking for something to eat.  Objective: Vital signs in last 24 hours: Temp:  [98 F (36.7 C)-98.6 F (37 C)] 98 F (36.7 C) (11/13 0518) Pulse Rate:  [86-106] 86 (11/13 0518) Resp:  [16] 16 (11/13 0518) BP: (103-116)/(60-80) 107/60 (11/13 0518) SpO2:  [97 %-99 %] 97 % (11/13 0518) Last BM Date : 09/09/22  Intake/Output from previous day: 11/12 0701 - 11/13 0700 In: 2259.5 [P.O.:480; I.V.:1579.5; IV Piggyback:200] Out: -  Intake/Output this shift: No intake/output data recorded.  PE: Gen:  Alert, NAD Abd: soft, distended, bowel sounds present, mild diffuse tenderness without rebound or guarding  Lab Results:  Recent Labs    09/11/22 1626 09/12/22 0522  WBC 8.8 7.2  HGB 13.6 12.8  HCT 41.1 38.8  PLT 218 199   BMET Recent Labs    09/11/22 1626 09/12/22 0522  NA 137 138  K 3.5 3.7  CL 108 111  CO2 22 18*  GLUCOSE 72 55*  BUN 8 11  CREATININE 0.50 0.49  CALCIUM 7.9* 7.8*   PT/INR No results for input(s): "LABPROT", "INR" in the last 72 hours. CMP     Component Value Date/Time   NA 138 09/12/2022 0522   K 3.7 09/12/2022 0522   CL 111 09/12/2022 0522   CO2 18 (L) 09/12/2022 0522   GLUCOSE 55 (L) 09/12/2022 0522   BUN 11 09/12/2022 0522   CREATININE 0.49 09/12/2022 0522   CALCIUM 7.8 (L) 09/12/2022 0522   PROT 5.5 (L) 09/12/2022 0522   ALBUMIN 3.3 (L) 09/12/2022 0522   AST 16 09/12/2022 0522   ALT 19 09/12/2022 0522   ALKPHOS 67 09/12/2022 0522   BILITOT 1.5 (H) 09/12/2022 0522   GFRNONAA >60 09/12/2022 0522   GFRAA >60 08/11/2019 1804   Lipase     Component Value Date/Time   LIPASE <10 (L) 09/10/2022 1622       Studies/Results: DG Abd Portable 1V-Small Bowel Obstruction Protocol-initial, 8 hr  delay  Result Date: 09/12/2022 CLINICAL DATA:  Eighty however delay image small-bowel approval. EXAM: PORTABLE ABDOMEN - 1 VIEW COMPARISON:  CT abdomen pelvis dated 09/10/2022. FINDINGS: Oral contrast has traversed into the colon. No bowel dilatation or evidence of obstruction. No free air. Right upper quadrant cholecystectomy clips. The osseous structures are intact. The soft tissues are unremarkable. IMPRESSION: Nonobstructive bowel gas pattern. Electronically Signed   By: Anner Crete M.D.   On: 09/12/2022 00:07    Anti-infectives: Anti-infectives (From admission, onward)    None        Assessment/Plan SBO - s/p SBO protocol, follow up film 11/11 showed contrast in colon and nonobstructive bowel gas pattern - Recurrent pain last night after clears but feeling somewhat better this morning. Continues to pass gas. Trial full liquids today. Mobilize.  ID - none FEN - FLD VTE - lovenox Foley - none  Fibromyalgia Bipolar disorder Chronic diarrhea  I reviewed last 24 h vitals and pain scores, last 48 h intake and output, last 24 h labs and trends, and last 24 h imaging results.    LOS: 0 days    Wellington [place], [facility] 09/13/2022, 9:13 AM Please see Amion for pager number during day hours 7:00am-4:30pm

## 2022-09-14 ENCOUNTER — Inpatient Hospital Stay (HOSPITAL_COMMUNITY): Payer: PPO

## 2022-09-14 DIAGNOSIS — K56600 Partial intestinal obstruction, unspecified as to cause: Secondary | ICD-10-CM | POA: Diagnosis not present

## 2022-09-14 MED ORDER — HYDROCODONE-ACETAMINOPHEN 10-325 MG PO TABS
1.0000 | ORAL_TABLET | Freq: Four times a day (QID) | ORAL | Status: DC | PRN
  Administered 2022-09-14: 1 via ORAL
  Filled 2022-09-14: qty 1

## 2022-09-14 MED ORDER — IOHEXOL 9 MG/ML PO SOLN
ORAL | Status: AC
  Filled 2022-09-14: qty 1000

## 2022-09-14 MED ORDER — IOHEXOL 9 MG/ML PO SOLN
500.0000 mL | ORAL | Status: AC
  Administered 2022-09-14 (×2): 500 mL via ORAL

## 2022-09-14 MED ORDER — HYDROCODONE-ACETAMINOPHEN 10-325 MG PO TABS: 1.0000 | ORAL_TABLET | Freq: Four times a day (QID) | ORAL | 0 refills | Status: DC | PRN

## 2022-09-14 MED ORDER — HYDROMORPHONE HCL 1 MG/ML IJ SOLN
1.0000 mg | INTRAMUSCULAR | Status: DC | PRN
  Administered 2022-09-14: 1 mg via INTRAVENOUS
  Filled 2022-09-14: qty 1

## 2022-09-14 MED ORDER — IOHEXOL 300 MG/ML  SOLN
100.0000 mL | Freq: Once | INTRAMUSCULAR | Status: AC | PRN
  Administered 2022-09-14: 100 mL via INTRAVENOUS

## 2022-09-14 MED ORDER — LORAZEPAM 1 MG PO TABS: 1.0000 mg | ORAL_TABLET | Freq: Three times a day (TID) | ORAL | 0 refills | Status: AC | PRN

## 2022-09-14 MED ORDER — PROMETHAZINE HCL 25 MG PO TABS: 25.0000 mg | ORAL_TABLET | Freq: Four times a day (QID) | ORAL | 0 refills | Status: DC | PRN

## 2022-09-14 NOTE — Discharge Summary (Signed)
Physician Discharge Summary  Gina [name] XFG:[id number] DOB: August 20, 1976 DOA: 09/10/2022  PCP: Velna Hatchet, MD  Admit date: 09/10/2022 Discharge date: 09/14/2022  Admitted From: Home Disposition: Home  Recommendations for Outpatient Follow-up:  Follow up with PCP in 1-2 weeks Follow-up with general surgery and gastroenterology at [facility]: No Equipment/Devices: None  Discharge Condition: Stable CODE STATUS: Full code Diet recommendation: Regular diet  History of present illness:  Gina [name] is a 46 y.o. female with past medical history significant for fibromyalgia, bipolar disorder, endometriosis, multiple intra-abdominal surgical interventions with cholecystectomy and hernia repair who presented to [facility] by direction of her OB/GYN on 11/10 with 2-day history of nausea/vomiting and abdominal pain with associated distention.  Additionally patient denies passing any flatus and no bowel movement for 2 days as well; normally she has diarrhea at baseline.  GYN concern for possible diverticulitis.  Denies fever or sick contacts, did not have any pain medications at home but did try Phenergan that did provide some relief.  Further denies any vaginal discharge.   In the ED, temperature 98.3 F, HR 76, RR 19, BP 114/68, SPO2 96% on room air.  WBC 10.6, hemoglobin 14.7, platelets 245.  Sodium 139, potassium 3.8, chloride 104, CO2 24, glucose 81, BUN 9, creatinine 0.60.  Lipase less than 10.  AST 18, ALT 21, total bilirubin 0.5.  Urinalysis with trace ketones, negative leukocytes, negative nitrate.  hCG negative.  CT abdomen/pelvis with findings of small bowel adhesions in the right hemipelvis with possible partial obstruction or stricture.  General surgery was consulted.  NG tube was placed.  Patient was transferred to [facility] under the hospital service for further evaluation and management of small bowel  obstruction.  Hospital course:   Small bowel obstruction Patient presenting to ED with 2-day history of nausea/vomiting associated with abdominal pain.  Imaging notable for partial small bowel obstruction.  Has extensive surgical history with previous hernia repairs, cholecystectomy and multiple surgeries for endometriosis with adhesions likely causing obstruction.  General surgery was consulted and followed during hospital course.  NG tube was initially placed for bowel rest/decompression and subsequently was discontinued on 09/12/2022.  Diet was advanced with out any reported episodes of vomiting.  Repeat CT abdomen/pelvis unrevealing on 09/14/2022.  General surgery recommends close outpatient follow-up with her primary gastroenterologist and general surgeon at [facility]   Bipolar disorder Fibromyalgia Depression Continue home clonazepam 0.5 mg p.o. twice daily, Lexapro 30 mg p.o. nightly  Discharge Diagnoses:  Principal Problem:   Partial small bowel obstruction (HCC) Active Problems:   Bipolar 1 disorder, mixed, moderate (HCC)   Chronic pain   Nausea and vomiting   SBO (small bowel obstruction) [facility])    Discharge Instructions  Discharge Instructions     Call MD for:  difficulty breathing, headache or visual disturbances   Complete by: As directed    Call MD for:  extreme fatigue   Complete by: As directed    Call MD for:  persistant dizziness or light-headedness   Complete by: As directed    Call MD for:  persistant nausea and vomiting   Complete by: As directed    Call MD for:  severe uncontrolled pain   Complete by: As directed    Call MD for:  temperature >100.4   Complete by: As directed    Diet - low sodium heart healthy   Complete by: As directed    Increase  activity slowly   Complete by: As directed       Allergies as of 09/14/2022       Reactions   Lamictal [lamotrigine] Anaphylaxis, Other (See Comments)   STEVEN JOHNSON'S  SYNDROME   Morphine And Related Other (See Comments)   Makes pt "very mean" "I get mean" Makes pt "very mean" Makes pt "very mean"   Nicotine Swelling, Palpitations   Other reaction(s): Respiratory Distress (ALLERGY/intolerance), Swelling (ALLERGY/intolerance), Tachycardia / Palpitations  (intolerance) Patches, lozenges Patches caused palpations lozenges throat swelled   Zofran Anaphylaxis   Chantix [varenicline Tartrate] Other (See Comments)    Diaphoresis, sweating.  Night terrors.  "went crazy"   Codeine Nausea And Vomiting   Haldol [haloperidol] Itching, Rash, Other (See Comments)   FLUSHING   Ibuprofen Nausea And Vomiting   Lidoderm [lidocaine] Other (See Comments)   DOESN'T WORK FOR PATIENT   Metaxalone Other (See Comments)   Pt does not remember reaction, REAL BAD REACTION   Sulfa Antibiotics Diarrhea, Other (See Comments), Nausea And Vomiting   GI PAINS ALSO GI PAINS ALSO   Sulfasalazine Diarrhea   GI PAINS   Toradol [ketorolac Tromethamine] Nausea And Vomiting   Duloxetine Other (See Comments)   unk   Duloxetine Hcl Other (See Comments)   Gabapentin Nausea And Vomiting   Ingrezza [valbenazine Tosylate] Hives   Methadone    seizures   Pregabalin    unk   Tramadol Other (See Comments)   Zolpidem    Mental status changes   Zolpidem Tartrate Other (See Comments)   Amoxicillin Rash   Has patient had a PCN reaction causing immediate rash, facial/tongue/throat swelling, SOB or lightheadedness with hypotension: No Has patient had a PCN reaction causing severe rash involving mucus membranes or skin necrosis: No Has patient had a PCN reaction that required hospitalization: No Has patient had a PCN reaction occurring within the last 10 years: No If all of the above answers are "NO", then may proceed with Cephalosporin use.   Aripiprazole Anxiety   hallucinations   Penicillins Rash   Other reaction(s): Urticaria / Hives (ALLERGY) Has patient had a PCN reaction causing  immediate rash, facial/tongue/throat swelling, SOB or lightheadedness with hypotension: No Has patient had a PCN reaction causing severe rash involving mucus membranes or skin necrosis: No Has patient had a PCN reaction that required hospitalization: No Has patient had a PCN reaction occurring within the last 10 years: No If all of the above answers are "NO", then may proceed with Cephalosporin use.   Valbenazine Rash        Medication List     STOP taking these medications    diphenoxylate-atropine 2.5-0.025 MG tablet Commonly known as: Lomotil   HYDROcodone-acetaminophen 5-325 MG tablet Commonly known as: NORCO/VICODIN Replaced by: HYDROcodone-acetaminophen 10-325 MG tablet   Lurasidone HCl 60 MG Tabs   methocarbamol 500 MG tablet Commonly known as: ROBAXIN   promethazine-dextromethorphan 6.25-15 MG/5ML syrup Commonly known as: PROMETHAZINE-DM   simethicone 80 MG chewable tablet Commonly known as: Gas-X       TAKE these medications    clonazePAM 0.5 MG tablet Commonly known as: KLONOPIN Take 0.5 mg by mouth 2 (two) times daily.   cyclobenzaprine 10 MG tablet Commonly known as: FLEXERIL Take 10 mg by mouth 3 (three) times daily as needed for muscle spasms.   escitalopram 20 MG tablet Commonly known as: LEXAPRO Take 30 mg by mouth at bedtime.   HYDROcodone-acetaminophen 10-325 MG tablet Commonly known as: NORCO Take 1  tablet by mouth every 6 (six) hours as needed for moderate pain. Replaces: HYDROcodone-acetaminophen 5-325 MG tablet   LORazepam 1 MG tablet Commonly known as: Ativan Take 1 tablet (1 mg total) by mouth every 8 (eight) hours as needed for anxiety.   promethazine 25 MG tablet Commonly known as: PHENERGAN Take 1 tablet (25 mg total) by mouth every 6 (six) hours as needed for nausea or vomiting. What changed: Another medication with the same name was removed. Continue taking this medication, and follow the directions you see here.         Follow-up Information     Velna Hatchet, MD. Schedule an appointment as soon as possible for a visit in 1 week(s).   Specialty: Internal Medicine Contact information: [address] [place] [address] [phone]                Allergies  Allergen Reactions   Lamictal [Lamotrigine] Anaphylaxis and Other (See Comments)    STEVEN JOHNSON'S SYNDROME   Morphine And Related Other (See Comments)    Makes pt "very mean" "I get mean" Makes pt "very mean" Makes pt "very mean"   Nicotine Swelling and Palpitations    Other reaction(s): Respiratory Distress (ALLERGY/intolerance), Swelling (ALLERGY/intolerance), Tachycardia / Palpitations  (intolerance) Patches, lozenges Patches caused palpations lozenges throat swelled   Zofran Anaphylaxis   Chantix [Varenicline Tartrate] Other (See Comments)     Diaphoresis, sweating.  Night terrors.  "went crazy"   Codeine Nausea And Vomiting   Haldol [Haloperidol] Itching, Rash and Other (See Comments)    FLUSHING   Ibuprofen Nausea And Vomiting   Lidoderm [Lidocaine] Other (See Comments)    DOESN'T WORK FOR PATIENT   Metaxalone Other (See Comments)    Pt does not remember reaction, REAL BAD REACTION   Sulfa Antibiotics Diarrhea, Other (See Comments) and Nausea And Vomiting    GI PAINS ALSO GI PAINS ALSO    Sulfasalazine Diarrhea    GI PAINS   Toradol [Ketorolac Tromethamine] Nausea And Vomiting   Duloxetine Other (See Comments)    unk   Duloxetine Hcl Other (See Comments)   Gabapentin Nausea And Vomiting   Ingrezza [Valbenazine Tosylate] Hives   Methadone     seizures   Pregabalin     unk   Tramadol Other (See Comments)   Zolpidem     Mental status changes   Zolpidem Tartrate Other (See Comments)   Amoxicillin Rash    Has patient had a PCN reaction causing immediate rash, facial/tongue/throat swelling, SOB or lightheadedness with hypotension: No Has patient had a PCN reaction causing severe rash involving mucus  membranes or skin necrosis: No Has patient had a PCN reaction that required hospitalization: No Has patient had a PCN reaction occurring within the last 10 years: No If all of the above answers are "NO", then may proceed with Cephalosporin use.    Aripiprazole Anxiety    hallucinations   Penicillins Rash    Other reaction(s): Urticaria / Hives (ALLERGY) Has patient had a PCN reaction causing immediate rash, facial/tongue/throat swelling, SOB or lightheadedness with hypotension: No Has patient had a PCN reaction causing severe rash involving mucus membranes or skin necrosis: No Has patient had a PCN reaction that required hospitalization: No Has patient had a PCN reaction occurring within the last 10 years: No If all of the above answers are "NO", then may proceed with Cephalosporin use.   Valbenazine Rash    Consultations: General surgery   Procedures/Studies: CT ABDOMEN PELVIS W  CONTRAST  Result Date: 09/14/2022 CLINICAL DATA:  Abdominal pain.  Acute nonlocalized EXAM: CT ABDOMEN AND PELVIS WITH CONTRAST TECHNIQUE: Multidetector CT imaging of the abdomen and pelvis was performed using the standard protocol following bolus administration of intravenous contrast. RADIATION DOSE REDUCTION: This exam was performed according to the departmental dose-optimization program which includes automated exposure control, adjustment of the mA and/or kV according to patient size and/or use of iterative reconstruction technique. CONTRAST:  184m OMNIPAQUE IOHEXOL 300 MG/ML  SOLN COMPARISON:  None Available. FINDINGS: Lower chest: Small RIGHT effusion. Hepatobiliary: No focal hepatic lesion. Postcholecystectomy. No biliary duct dilatation. Common bile duct is normal. Pancreas: Pancreas is normal. No ductal dilatation. No pancreatic inflammation. Spleen: Normal spleen Adrenals/urinary tract: Adrenal glands and kidneys are normal. The ureters and bladder normal. Stomach/Bowel: Stomach, small bowel, appendix,  and cecum are normal. The colon and rectosigmoid colon are normal. Vascular/Lymphatic: Abdominal aorta is normal caliber. No periportal or retroperitoneal adenopathy. No pelvic adenopathy. Reproductive: Post hysterectomy.  Adnexa unremarkable Other: No free fluid. Musculoskeletal: No aggressive osseous lesion. IMPRESSION: 1. No acute abdominal or pelvic findings. 2. Normal appendix. 3. Post hysterectomy and cholecystectomy 4. Trace RIGHT pleural effusion Electronically Signed   By: SSuzy BouchardM.D.   On: 09/14/2022 13:13   DG Abd Portable 1V-Small Bowel Obstruction Protocol-initial, 8 hr delay  Result Date: 09/12/2022 CLINICAL DATA:  Eighty however delay image small-bowel approval. EXAM: PORTABLE ABDOMEN - 1 VIEW COMPARISON:  CT abdomen pelvis dated 09/10/2022. FINDINGS: Oral contrast has traversed into the colon. No bowel dilatation or evidence of obstruction. No free air. Right upper quadrant cholecystectomy clips. The osseous structures are intact. The soft tissues are unremarkable. IMPRESSION: Nonobstructive bowel gas pattern. Electronically Signed   By: AAnner CreteM.D.   On: 09/12/2022 00:07   DG Abd Portable 1 View  Result Date: 09/11/2022 CLINICAL DATA:  Nasogastric tube placement. EXAM: PORTABLE ABDOMEN - 1 VIEW COMPARISON:  July 05, 2019 FINDINGS: Nasogastric tube is seen with its distal tip noted within the body of the stomach. Its distal side hole sits proximal to the expected region of the gastroesophageal junction. The bowel gas pattern is normal. No radio-opaque calculi or other significant radiographic abnormality are seen. IMPRESSION: Nasogastric tube positioning, as described above. Electronically Signed   By: TVirgina NorfolkM.D.   On: 09/11/2022 01:33   CT Abdomen Pelvis W Contrast  Result Date: 09/10/2022 CLINICAL DATA:  Left lower quadrant abdominal pain. EXAM: CT ABDOMEN AND PELVIS WITH CONTRAST TECHNIQUE: Multidetector CT imaging of the abdomen and pelvis was  performed using the standard protocol following bolus administration of intravenous contrast. RADIATION DOSE REDUCTION: This exam was performed according to the departmental dose-optimization program which includes automated exposure control, adjustment of the mA and/or kV according to patient size and/or use of iterative reconstruction technique. CONTRAST:  1083mOMNIPAQUE IOHEXOL 300 MG/ML  SOLN COMPARISON:  CT abdomen pelvis dated 06/05/2022. FINDINGS: Lower chest: The visualized lung bases are clear. No intra-abdominal free air or free fluid. Hepatobiliary: The liver is unremarkable. No biliary dilatation. Cholecystectomy. No retained calcified stone noted in the central CBD. Pancreas: Unremarkable. No pancreatic ductal dilatation or surrounding inflammatory changes. Spleen: Normal in size without focal abnormality. Adrenals/Urinary Tract: The adrenal glands are unremarkable. Subcentimeter right renal interpolar hypodense lesion is too small to characterize but similar to prior CT, likely a cyst. No focal. There is no hydronephrosis on either side. The visualized ureters and urinary bladder appear unremarkable. Stomach/Bowel: Evaluation of the bowel is limited  in the absence of or contrast. There is a cluster of small bowel loops in the right hemipelvis abutting the right bladder dome and vaginal cuff most consistent with adhesions. Several collapsed segments of the small bowel in the right hemipelvis may be related to peristalsis. A degree of stricture is not excluded. The bowel loops measure up to approximately 2.5 cm in caliber. Small-bowel series study may provide better evaluation if there is clinical concern for developing obstruction. The appendix is normal. Vascular/Lymphatic: The abdominal aorta and IVC are unremarkable. No portal venous gas. There is no adenopathy. Reproductive: Hysterectomy.  No adnexal masses. Other: Midline vertical anterior pelvic wall incisional scar. Musculoskeletal: No acute or  significant osseous findings. IMPRESSION: Findings of small-bowel adhesions in the right hemipelvis. Top-normal caliber small bowel loops in the right lower quadrant may be physiologic. A partial obstruction or stricture is not entirely excluded. Small-bowel series may provide better evaluation if there is clinical concern for developing obstruction. Electronically Signed   By: Anner Crete M.D.   On: 09/10/2022 21:04     Subjective: Patient seen examined bedside, resting comfortably.  Family and primary RN present.  Patient with repeat CT abdomen/pelvis this afternoon with no significant findings and previous contrast from abdominal plain films reaching the colon.  Discussed with general surgery, no surgical indication at this time and recommend discharge with close outpatient follow-up with her primary specialist at [facility], general surgery and gastroenterology.  Discharge Exam: Vitals:   09/14/22 0638 09/14/22 1207  BP: 98/67 106/73  Pulse: 63 82  Resp: 16 18  Temp: 98.7 F (37.1 C) 98.6 F (37 C)  SpO2: 96% 97%   Vitals:   09/13/22 1307 09/13/22 2023 09/14/22 0638 09/14/22 1207  BP: 106/67 106/71 98/67 106/73  Pulse: 91 88 63 82  Resp: '18 18 16 18  '$ Temp: 98.4 F (36.9 C) 98.3 F (36.8 C) 98.7 F (37.1 C) 98.6 F (37 C)  TempSrc: Oral Oral Oral Oral  SpO2: 96% 99% 96% 97%  Weight:      Height:        Physical Exam: GEN: NAD, alert and oriented x 3, wd/wn HEENT: NCAT, PERRL, EOMI, sclera clear, MMM PULM: CTAB w/o wheezes/crackles, normal respiratory effort CV: RRR w/o M/G/R GI: abd soft, mild distention, nontender to palpation, NABS, no R/G/M MSK: no peripheral edema, muscle strength globally intact 5/5 bilateral upper/lower extremities NEURO: CN II-XII intact, no focal deficits, sensation to light touch intact PSYCH: normal mood/affect Integumentary: dry/intact, no rashes or wounds    The results of significant diagnostics from this  hospitalization (including imaging, microbiology, ancillary and laboratory) are listed below for reference.     Microbiology: No results found for this or any previous visit (from the past 240 hour(s)).   Labs: BNP (last 3 results) No results for input(s): "BNP" in the last 8760 hours. Basic Metabolic Panel: Recent Labs  Lab 09/10/22 1622 09/11/22 1626 09/12/22 0522  NA 139 137 138  K 3.8 3.5 3.7  CL 104 108 111  CO2 24 22 18*  GLUCOSE 81 72 55*  BUN '9 8 11  '$ CREATININE 0.60 0.50 0.49  CALCIUM 9.1 7.9* 7.8*   Liver Function Tests: Recent Labs  Lab 09/10/22 1622 09/11/22 1626 09/12/22 0522  AST '18 19 16  '$ ALT '21 23 19  '$ ALKPHOS 77 70 67  BILITOT 0.5 1.2 1.5*  PROT 6.7 6.1* 5.5*  ALBUMIN 4.3 3.4* 3.3*   Recent Labs  Lab 09/10/22 1622  LIPASE <10*   No results for input(s): "AMMONIA" in the last 168 hours. CBC: Recent Labs  Lab 09/10/22 1622 09/11/22 1626 09/12/22 0522  WBC 10.6* 8.8 7.2  NEUTROABS  --  6.1  --   HGB 14.7 13.6 12.8  HCT 44.0 41.1 38.8  MCV 96.3 98.3 98.7  PLT 245 218 199   Cardiac Enzymes: No results for input(s): "CKTOTAL", "CKMB", "CKMBINDEX", "TROPONINI" in the last 168 hours. BNP: Invalid input(s): "POCBNP" CBG: No results for input(s): "GLUCAP" in the last 168 hours. D-Dimer No results for input(s): "DDIMER" in the last 72 hours. Hgb A1c No results for input(s): "HGBA1C" in the last 72 hours. Lipid Profile No results for input(s): "CHOL", "HDL", "LDLCALC", "TRIG", "CHOLHDL", "LDLDIRECT" in the last 72 hours. Thyroid function studies No results for input(s): "TSH", "T4TOTAL", "T3FREE", "THYROIDAB" in the last 72 hours.  Invalid input(s): "FREET3" Anemia work up No results for input(s): "VITAMINB12", "FOLATE", "FERRITIN", "TIBC", "IRON", "RETICCTPCT" in the last 72 hours. Urinalysis    Component Value Date/Time   COLORURINE YELLOW 09/10/2022 2103   APPEARANCEUR CLEAR 09/10/2022 2103   LABSPEC 1.036 (H) 09/10/2022 2103    PHURINE 6.0 09/10/2022 2103   GLUCOSEU NEGATIVE 09/10/2022 2103   HGBUR NEGATIVE 09/10/2022 2103   HGBUR negative 06/11/2010 1130   BILIRUBINUR NEGATIVE 09/10/2022 2103   KETONESUR TRACE (A) 09/10/2022 2103   PROTEINUR NEGATIVE 09/10/2022 2103   UROBILINOGEN 0.2 07/02/2015 1950   NITRITE NEGATIVE 09/10/2022 2103   LEUKOCYTESUR NEGATIVE 09/10/2022 2103   Sepsis Labs Recent Labs  Lab 09/10/22 1622 09/11/22 1626 09/12/22 0522  WBC 10.6* 8.8 7.2   Microbiology No results found for this or any previous visit (from the past 240 hour(s)).   Time coordinating discharge: Over 30 minutes  SIGNED:   Kano Heckmann J [place] DO  [facility] 09/14/2022, 3:05 PM

## 2022-09-14 NOTE — Progress Notes (Signed)
PROGRESS NOTE    Gina [name]  TKP:[id number] DOB: September 10, 1976 DOA: 09/10/2022 PCP: Velna Hatchet, MD    Brief Narrative:   Gina [name] is a 46 y.o. female with past medical history significant for fibromyalgia, bipolar disorder, endometriosis, multiple intra-abdominal surgical interventions with cholecystectomy and hernia repair who presented to [facility] by direction of her OB/GYN on 11/10 with 2-day history of nausea/vomiting and abdominal pain with associated distention.  Additionally patient denies passing any flatus and no bowel movement for 2 days as well; normally she has diarrhea at baseline.  GYN concern for possible diverticulitis.  Denies fever or sick contacts, did not have any pain medications at home but did try Phenergan that did provide some relief.  Further denies any vaginal discharge.  In the ED, temperature 98.3 F, HR 76, RR 19, BP 114/68, SPO2 96% on room air.  WBC 10.6, hemoglobin 14.7, platelets 245.  Sodium 139, potassium 3.8, chloride 104, CO2 24, glucose 81, BUN 9, creatinine 0.60.  Lipase less than 10.  AST 18, ALT 21, total bilirubin 0.5.  Urinalysis with trace ketones, negative leukocytes, negative nitrate.  hCG negative.  CT abdomen/pelvis with findings of small bowel adhesions in the right hemipelvis with possible partial obstruction or stricture.  General surgery was consulted.  NG tube was placed.  Patient was transferred to [facility] under the hospital service for further evaluation and management of small bowel obstruction.  Assessment & Plan:   Small bowel obstruction Patient presenting to ED with 2-day history of nausea/vomiting associated with abdominal pain.  Imaging notable for partial small bowel obstruction.  Has extensive surgical history with previous hernia repairs, cholecystectomy and multiple surgeries for endometriosis with adhesions likely causing obstruction.  NG tube was initially placed for bowel  rest/decompression and subsequently was discontinued on 09/12/2022. -- General surgery following, appreciate assistance -- soft diet -- Patient with recurrent severe pain yesterday on diet advancement, general surgery ordering repeat CT abdomen/pelvis with contrast today -- Pain control with Norco 10-325 mg p.o. every 6 hours as needed moderate pain, Dilaudid 0.5 mg IV every 2 hours as needed severe pain -- Supportive care, antiemetics -- Further per general surgery  Bipolar disorder Fibromyalgia Depression Continue home clonazepam 0.5 mg p.o. twice daily, Lexapro 30 mg p.o. nightly  DVT prophylaxis: enoxaparin (LOVENOX) injection 40 mg Start: 09/11/22 1800    Code Status: Full Code Family Communication: No family present at bedside this am  Disposition Plan:  Level of care: Med-Surg Status is: Inpatient Remains inpatient appropriate because: General surgery repeating CT abdomen/pelvis today      Consultants:  General surgery, Dr. Ninfa Linden  Procedures:  None  Antimicrobials:  None   Subjective: Patient seen examined bedside, resting comfortably.  Sleeping but easily arousable.  No family present at bedside this morning.  Yesterday continued with pain diet advancement but this morning wishes to try something else other than liquids.  Seen by general surgery and given continued abdominal discomfort they are repeating CT abdomen/pelvis today.  Patient with no other complaints or concerns at this time.  Patient denies headache, dizziness, no chest pain, no shortness of breath, no cough/congestion, no focal weakness, no fatigue.  No acute events overnight per nursing staff.  Objective: Vitals:   09/13/22 0518 09/13/22 1307 09/13/22 2023 09/14/22 0638  BP: 107/60 106/67 106/71 98/67  Pulse: 86 91 88 63  Resp: '16 18 18 16  '$ Temp: 98 F (36.7 C) 98.4 F (36.9 C) 98.3 F (36.8 C)  98.7 F (37.1 C)  TempSrc: Oral Oral Oral Oral  SpO2: 97% 96% 99% 96%  Weight:      Height:         Intake/Output Summary (Last 24 hours) at 09/14/2022 1046 Last data filed at 09/14/2022 0600 Gross per 24 hour  Intake 3175.99 ml  Output --  Net 3175.99 ml   Filed Weights   09/10/22 1422  Weight: 82.1 kg    Examination:  Physical Exam: GEN: NAD, alert and oriented x 3, wd/wn HEENT: NCAT, PERRL, EOMI, sclera clear, MMM,  PULM: CTAB w/o wheezes/crackles, normal respiratory effort CV: RRR w/o M/G/R GI: abd soft, NT, + mild distention, + BS, no R/G/M MSK: no peripheral edema, muscle strength globally intact 5/5 bilateral upper/lower extremities NEURO: CN II-XII intact, no focal deficits, sensation to light touch intact PSYCH: normal mood/affect Integumentary: dry/intact, no rashes or wounds    Data Reviewed: I have personally reviewed following labs and imaging studies  CBC: Recent Labs  Lab 09/10/22 1622 09/11/22 1626 09/12/22 0522  WBC 10.6* 8.8 7.2  NEUTROABS  --  6.1  --   HGB 14.7 13.6 12.8  HCT 44.0 41.1 38.8  MCV 96.3 98.3 98.7  PLT 245 218 381   Basic Metabolic Panel: Recent Labs  Lab 09/10/22 1622 09/11/22 1626 09/12/22 0522  NA 139 137 138  K 3.8 3.5 3.7  CL 104 108 111  CO2 24 22 18*  GLUCOSE 81 72 55*  BUN '9 8 11  '$ CREATININE 0.60 0.50 0.49  CALCIUM 9.1 7.9* 7.8*   GFR: Estimated Creatinine Clearance: 98.9 mL/min (by C-G formula based on SCr of 0.49 mg/dL). Liver Function Tests: Recent Labs  Lab 09/10/22 1622 09/11/22 1626 09/12/22 0522  AST '18 19 16  '$ ALT '21 23 19  '$ ALKPHOS 77 70 67  BILITOT 0.5 1.2 1.5*  PROT 6.7 6.1* 5.5*  ALBUMIN 4.3 3.4* 3.3*   Recent Labs  Lab 09/10/22 1622  LIPASE <10*   No results for input(s): "AMMONIA" in the last 168 hours. Coagulation Profile: No results for input(s): "INR", "PROTIME" in the last 168 hours. Cardiac Enzymes: No results for input(s): "CKTOTAL", "CKMB", "CKMBINDEX", "TROPONINI" in the last 168 hours. BNP (last 3 results) No results for input(s): "PROBNP" in the last 8760  hours. HbA1C: No results for input(s): "HGBA1C" in the last 72 hours. CBG: No results for input(s): "GLUCAP" in the last 168 hours. Lipid Profile: No results for input(s): "CHOL", "HDL", "LDLCALC", "TRIG", "CHOLHDL", "LDLDIRECT" in the last 72 hours. Thyroid Function Tests: No results for input(s): "TSH", "T4TOTAL", "FREET4", "T3FREE", "THYROIDAB" in the last 72 hours. Anemia Panel: No results for input(s): "VITAMINB12", "FOLATE", "FERRITIN", "TIBC", "IRON", "RETICCTPCT" in the last 72 hours. Sepsis Labs: No results for input(s): "PROCALCITON", "LATICACIDVEN" in the last 168 hours.  No results found for this or any previous visit (from the past 240 hour(s)).       Radiology Studies: No results found.      Scheduled Meds:  clonazePAM  0.5 mg Oral BID   enoxaparin (LOVENOX) injection  40 mg Subcutaneous Q24H   escitalopram  30 mg Oral QHS   iohexol       pantoprazole (PROTONIX) IV  40 mg Intravenous Q24H   Continuous Infusions:  sodium chloride 100 mL/hr at 09/14/22 0314   promethazine (PHENERGAN) injection (IM or IVPB) 12.5 mg (09/14/22 0930)     LOS: 1 day    Time spent: 48 minutes spent on chart review, discussion with nursing staff, consultants,  updating family and interview/physical exam; more than 50% of that time was spent in counseling and/or coordination of care.    Eduarda Scrivens J [place] DO [facility] Available via Epic secure chat 7am-7pm After these hours, please refer to coverage provider listed on [email or web address] 09/14/2022, 10:46 AM

## 2022-09-14 NOTE — Progress Notes (Signed)
[facility] Progress Note     Subjective: CC-  Yesterday similar to the day before. Did ok with full liquids initially then had increased abdominal pain last night. Some nausea, no emesis. Denies n/v this morning. Passing little to no flatus, no BM. States that her pain is still severe this morning, more on the right than the left.  Objective: Vital signs in last 24 hours: Temp:  [98.3 F (36.8 C)-98.7 F (37.1 C)] 98.7 F (37.1 C) (11/14 0638) Pulse Rate:  [63-91] 63 (11/14 0638) Resp:  [16-18] 16 (11/14 0638) BP: (98-106)/(67-71) 98/67 (11/14 0638) SpO2:  [96 %-99 %] 96 % (11/14 0638) Last BM Date : 09/09/22  Intake/Output from previous day: 11/13 0701 - 11/14 0700 In: 3176 [P.O.:720; I.V.:2305.8; IV Piggyback:150.2] Out: -  Intake/Output this shift: No intake/output data recorded.  PE: Gen:  Alert, NAD, comfortable Abd: soft, nondistended, bowel sounds present, mild diffuse tenderness (R>L hemiabdomen) without rebound or guarding   Lab Results:  Recent Labs    09/11/22 1626 09/12/22 0522  WBC 8.8 7.2  HGB 13.6 12.8  HCT 41.1 38.8  PLT 218 199   BMET Recent Labs    09/11/22 1626 09/12/22 0522  NA 137 138  K 3.5 3.7  CL 108 111  CO2 22 18*  GLUCOSE 72 55*  BUN 8 11  CREATININE 0.50 0.49  CALCIUM 7.9* 7.8*   PT/INR No results for input(s): "LABPROT", "INR" in the last 72 hours. CMP     Component Value Date/Time   NA 138 09/12/2022 0522   K 3.7 09/12/2022 0522   CL 111 09/12/2022 0522   CO2 18 (L) 09/12/2022 0522   GLUCOSE 55 (L) 09/12/2022 0522   BUN 11 09/12/2022 0522   CREATININE 0.49 09/12/2022 0522   CALCIUM 7.8 (L) 09/12/2022 0522   PROT 5.5 (L) 09/12/2022 0522   ALBUMIN 3.3 (L) 09/12/2022 0522   AST 16 09/12/2022 0522   ALT 19 09/12/2022 0522   ALKPHOS 67 09/12/2022 0522   BILITOT 1.5 (H) 09/12/2022 0522   GFRNONAA >60 09/12/2022 0522   GFRAA >60 08/11/2019 1804   Lipase     Component Value Date/Time   LIPASE <10 (L)  09/10/2022 1622       Studies/Results: No results found.  Anti-infectives: Anti-infectives (From admission, onward)    None        Assessment/Plan SBO - s/p SBO protocol, follow up film 11/11 showed contrast in colon and nonobstructive bowel gas pattern - Recurrent pain with attempting to advance diet. She has passed some flatus but no BM. Will repeat CT scan today with oral contrast.   ID - none FEN - FLD VTE - lovenox Foley - none   Fibromyalgia Bipolar disorder Chronic diarrhea   I reviewed last 24 h vitals and pain scores, last 48 h intake and output, last 24 h labs and trends, and last 24 h imaging results.    LOS: 1 day    Wellington Hampshire, [facility] 09/14/2022, 8:55 AM Please see Amion for pager number during day hours 7:00am-4:30pm

## 2022-09-14 NOTE — Progress Notes (Signed)
  Transition of Care [facility]) Screening Note   Patient Details  Name: Gina [name] Date of Birth: 06-29-1976   Transition of Care [facility]) CM/SW Contact:    Lennart Pall, LCSW Phone Number: 09/14/2022, 8:58 AM    Transition of Care Department [facility]) has reviewed patient and no TOC needs have been identified at this time. We will continue to monitor patient advancement through interdisciplinary progression rounds. If new patient transition needs arise, please place a TOC consult.

## 2022-09-14 NOTE — Progress Notes (Signed)
Patient was given discharge instructions, and all questions were answered.  Patient was stable for discharge and was taken to the main exit by wheelchair. 

## 2022-09-21 ENCOUNTER — Emergency Department (HOSPITAL_COMMUNITY)
Admission: EM | Admit: 2022-09-21 | Discharge: 2022-09-22 | Disposition: A | Discharge status: Home / Self Care | Payer: PPO | Attending: Emergency Medicine | Admitting: Emergency Medicine

## 2022-09-21 ENCOUNTER — Emergency Department (HOSPITAL_COMMUNITY): Payer: PPO

## 2022-09-21 ENCOUNTER — Encounter (HOSPITAL_COMMUNITY): Payer: Self-pay

## 2022-09-21 ENCOUNTER — Other Ambulatory Visit: Payer: Self-pay

## 2022-09-21 DIAGNOSIS — R1030 Lower abdominal pain, unspecified: Secondary | ICD-10-CM | POA: Insufficient documentation

## 2022-09-21 DIAGNOSIS — F319 Bipolar disorder, unspecified: Secondary | ICD-10-CM | POA: Diagnosis not present

## 2022-09-21 DIAGNOSIS — F419 Anxiety disorder, unspecified: Secondary | ICD-10-CM | POA: Diagnosis not present

## 2022-09-21 DIAGNOSIS — M797 Fibromyalgia: Secondary | ICD-10-CM | POA: Diagnosis not present

## 2022-09-21 DIAGNOSIS — R1110 Vomiting, unspecified: Secondary | ICD-10-CM | POA: Diagnosis not present

## 2022-09-21 DIAGNOSIS — R112 Nausea with vomiting, unspecified: Secondary | ICD-10-CM | POA: Insufficient documentation

## 2022-09-21 DIAGNOSIS — R5383 Other fatigue: Secondary | ICD-10-CM | POA: Diagnosis not present

## 2022-09-21 DIAGNOSIS — Z90710 Acquired absence of both cervix and uterus: Secondary | ICD-10-CM | POA: Diagnosis not present

## 2022-09-21 DIAGNOSIS — R102 Pelvic and perineal pain: Secondary | ICD-10-CM | POA: Diagnosis not present

## 2022-09-21 DIAGNOSIS — R1031 Right lower quadrant pain: Secondary | ICD-10-CM | POA: Diagnosis not present

## 2022-09-21 DIAGNOSIS — R109 Unspecified abdominal pain: Secondary | ICD-10-CM | POA: Diagnosis not present

## 2022-09-21 DIAGNOSIS — K56609 Unspecified intestinal obstruction, unspecified as to partial versus complete obstruction: Secondary | ICD-10-CM | POA: Diagnosis not present

## 2022-09-21 LAB — COMPREHENSIVE METABOLIC PANEL
ALT: 33 U/L (ref 0–44)
AST: 29 U/L (ref 15–41)
Albumin: 4 g/dL (ref 3.5–5.0)
Alkaline Phosphatase: 95 U/L (ref 38–126)
Anion gap: 4 — ABNORMAL LOW (ref 5–15)
BUN: 9 mg/dL (ref 6–20)
CO2: 25 mmol/L (ref 22–32)
Calcium: 8.6 mg/dL — ABNORMAL LOW (ref 8.9–10.3)
Chloride: 108 mmol/L (ref 98–111)
Creatinine, Ser: 0.53 mg/dL (ref 0.44–1.00)
GFR, Estimated: >60 mL/min (ref >60)
Glucose, Bld: 104 mg/dL — ABNORMAL HIGH (ref 70–99)
Potassium: 3.9 mmol/L (ref 3.5–5.1)
Sodium: 137 mmol/L (ref 135–145)
Total Bilirubin: 0.4 mg/dL (ref 0.3–1.2)
Total Protein: 6.8 g/dL (ref 6.5–8.1)

## 2022-09-21 LAB — CBC
HCT: 42.3 % (ref 36.0–46.0)
Hemoglobin: 14.1 g/dL (ref 12.0–15.0)
MCH: 32.3 pg (ref 26.0–34.0)
MCHC: 33.3 g/dL (ref 30.0–36.0)
MCV: 97 fL (ref 80.0–100.0)
Platelets: 285 10*3/uL (ref 150–400)
RBC: 4.36 MIL/uL (ref 3.87–5.11)
RDW: 12.3 % (ref 11.5–15.5)
WBC: 7.4 10*3/uL (ref 4.0–10.5)
nRBC: 0 % (ref 0.0–0.2)

## 2022-09-21 LAB — LIPASE, BLOOD: Lipase: 32 U/L (ref 11–51)

## 2022-09-21 MED ORDER — IOHEXOL 300 MG/ML  SOLN
100.0000 mL | Freq: Once | INTRAMUSCULAR | Status: AC | PRN
  Administered 2022-09-21: 100 mL via INTRAVENOUS

## 2022-09-21 MED ORDER — FENTANYL CITRATE PF 50 MCG/ML IJ SOSY
50.0000 ug | PREFILLED_SYRINGE | Freq: Once | INTRAMUSCULAR | Status: AC
  Administered 2022-09-21: 50 ug via INTRAVENOUS
  Filled 2022-09-21: qty 1

## 2022-09-21 MED ORDER — SODIUM CHLORIDE 0.9 % IV SOLN
12.5000 mg | Freq: Once | INTRAVENOUS | Status: DC
  Filled 2022-09-21: qty 0.5

## 2022-09-21 NOTE — ED Provider Triage Note (Signed)
Emergency Medicine Provider Triage Evaluation Note  Gina [name] , a 46 y.o. female  was evaluated in triage.  Pt complains of abdominal pain since hospital discharge 1 week ago. Was admitted for partial bowel obstruction. Describes decreased appetite and RLQ abdominal pain radiating to lower back. Concerned about recurrent obstruction. Vomitted and had diarrhea today.   Review of Systems  Positive: Abd pain, vomiting, diarrhea Negative: fever  Physical Exam  BP (!) 107/96   Pulse (!) 103   Temp 98.4 F (36.9 C) (Oral)   Resp 16   LMP 01/01/2004   SpO2 93%  Gen:   Awake, no distress   Resp:  Normal effort  MSK:   Moves extremities without difficulty  Other:    Medical Decision Making  Medically screening exam initiated at 8:20 PM.  Appropriate orders placed.  Gina [name] was informed that the remainder of the evaluation will be completed by another provider, this initial triage assessment does not replace that evaluation, and the importance of remaining in the ED until their evaluation is complete.  Will obtain labs, nursing staff plans to obtain IV to give medication and expedite CT imaging   Endrit Gittins T, PA-C 09/21/22 2021

## 2022-09-21 NOTE — ED Notes (Signed)
Pt request lab draw with IV placement

## 2022-09-21 NOTE — ED Triage Notes (Signed)
Recently admitted for SBO. Says she had NG tube for several days.   Today has subjective abdominal swelling, nausea, vomiting and pain.

## 2022-09-21 NOTE — ED Notes (Signed)
Pt needs HCG, labs and 22 g or greater IV for CT

## 2022-09-22 ENCOUNTER — Emergency Department (HOSPITAL_COMMUNITY): Payer: PPO

## 2022-09-22 DIAGNOSIS — Z90710 Acquired absence of both cervix and uterus: Secondary | ICD-10-CM | POA: Diagnosis not present

## 2022-09-22 DIAGNOSIS — R102 Pelvic and perineal pain: Secondary | ICD-10-CM | POA: Diagnosis not present

## 2022-09-22 LAB — URINALYSIS, ROUTINE W REFLEX MICROSCOPIC
Bacteria, UA: NONE SEEN
Bilirubin Urine: NEGATIVE
Glucose, UA: NEGATIVE mg/dL
Hgb urine dipstick: NEGATIVE
Ketones, ur: NEGATIVE mg/dL
Leukocytes,Ua: NEGATIVE
Nitrite: NEGATIVE
Protein, ur: NEGATIVE mg/dL
Specific Gravity, Urine: 1.015 (ref 1.005–1.030)
pH: 5 (ref 5.0–8.0)

## 2022-09-22 MED ORDER — SODIUM CHLORIDE 0.9 % IV BOLUS
1000.0000 mL | Freq: Once | INTRAVENOUS | Status: AC
  Administered 2022-09-22: 1000 mL via INTRAVENOUS

## 2022-09-22 MED ORDER — SODIUM CHLORIDE 0.9 % IV SOLN
12.5000 mg | Freq: Once | INTRAVENOUS | Status: AC
  Administered 2022-09-22: 12.5 mg via INTRAVENOUS
  Filled 2022-09-22: qty 12.5

## 2022-09-22 MED ORDER — FENTANYL CITRATE PF 50 MCG/ML IJ SOSY
50.0000 ug | PREFILLED_SYRINGE | Freq: Once | INTRAMUSCULAR | Status: AC
  Administered 2022-09-22: 50 ug via INTRAVENOUS
  Filled 2022-09-22: qty 1

## 2022-09-22 MED ORDER — METOCLOPRAMIDE HCL 5 MG/ML IJ SOLN: 10.0000 mg | Freq: Once | INTRAMUSCULAR | Status: DC

## 2022-09-22 MED ORDER — HYDROMORPHONE HCL 1 MG/ML IJ SOLN
0.5000 mg | Freq: Once | INTRAMUSCULAR | Status: AC
  Administered 2022-09-22: 0.5 mg via INTRAVENOUS
  Filled 2022-09-22: qty 1

## 2022-09-22 MED ORDER — HYOSCYAMINE SULFATE 0.125 MG PO TBDP
0.2500 mg | ORAL_TABLET | Freq: Once | ORAL | Status: AC
  Administered 2022-09-22: 0.25 mg via ORAL
  Filled 2022-09-22: qty 2

## 2022-09-22 MED ORDER — DROPERIDOL 2.5 MG/ML IJ SOLN
1.2500 mg | Freq: Once | INTRAMUSCULAR | Status: AC
  Administered 2022-09-22: 1.25 mg via INTRAVENOUS
  Filled 2022-09-22: qty 2

## 2022-09-22 NOTE — ED Provider Notes (Signed)
[facility] DEPT Provider Note  CSN: [id number] Arrival date & time: 09/21/22 1749  Chief Complaint(s) Abdominal Pain  HPI Gina [name] is a 46 y.o. female extensive past medical history listed below including recent partial SBO requiring admission to the hospital on 11/10 and discharged on 11/12 who presents to the emergency department with persistent and gradually worsening lower abdominal pain with decreased bowel movements and associated nausea and nonbloody nonbilious emesis.  Patient has been taking her home pain medicine and antiemetic without relief.  States that this feels like her prior bowel obstruction.  Pain is generalized but mostly in the lower abdomen.  States that she had a recent exploratory laparoscopically for endometriosis staging but who or surgeon did not see any lesions.  She did report that her OB/GYN saw a twisted ovary that was fixed.  Additionally patient endorses generalized fatigue.  No other physical complaints.  The history is provided by the patient.    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   Eating disorder    ENDOMETRIOSIS 12/16/2009   FATIGUE 06/11/2010   Fibromyalgia    GERD 10/23/2009   Heart palpitations    HERPES SIMPLEX INFECTION 06/11/2010   LENTIGO 04/23/2009   LUNG NODULE 06/17/2010   Narcotic abuse, continuous ([place])    Ovarian cyst    Palpitations 10/23/2009   Panic disorder    Renal disorder    Rheumatoid arthritis(714.0)    Stricture and stenosis of esophagus 05/13/2010   TOBACCO ABUSE 08/07/2008   Urinary  tract infection    UTI (urinary tract infection)    Patient Active Problem List   Diagnosis Date Noted   SBO (small bowel obstruction) ([place]) 09/13/2022   Nausea and vomiting 09/11/2022   Partial small bowel obstruction ([place]) 09/10/2022   Piriformis syndrome of both sides 05/31/2019   History of bipolar disorder 04/18/2019   Tardive dyskinesia 04/18/2019   Urticaria due to drug allergy 04/18/2019   Lumbar spondylosis 02/05/2019   Bipolar affective disorder, current episode mixed ([place]) 01/02/2019   Arthritis 01/02/2019   Cigarette nicotine dependence without complication 44/96/7591   Bilateral temporomandibular joint pain 10/02/2018   Referred otalgia, bilateral 10/02/2018   Paresthesias 03/09/2018   Common migraine 04/22/2016   Bipolar 1 disorder, mixed, moderate ([place]) 07/15/2015    Class: Chronic   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   Sciatica 01/09/2015   Reactive hypoglycemia 12/17/2013   Musculoskeletal malfunction arising from mental factors 04/04/2013   Incisional hernia 11/15/2012   Other postprocedural status(V45.89) 11/15/2012   S/P hernia surgery 11/15/2012   Abdominal pain 11/11/2012   Chronic pain syndrome 11/11/2012   Disorder of sacrum 11/11/2012   Chronic pain 12/21/2011   Heartburn 10/04/2011   DYSPHAGIA 10/04/2011   Depression with anxiety 09/15/2010   CONSTIPATION 09/15/2010   LUNG NODULE 06/17/2010   Herpes simplex virus (HSV) infection 06/11/2010   FATIGUE 06/11/2010   STRICTURE AND STENOSIS OF ESOPHAGUS 05/13/2010   ABDOMINAL WALL HERNIA 02/27/2010   ENDOMETRIOSIS 12/16/2009   GERD 10/23/2009   PALPITATIONS 10/23/2009   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  DYSTHYMIC DISORDER 06/20/2009   CONDYLOMA ACUMINATUM 04/23/2009   LENTIGO 04/23/2009   DENTAL PAIN 03/19/2009   CERVICAL RADICULOPATHY 12/11/2008   Cervical radiculopathy 12/11/2008   PANIC DISORDER WITHOUT AGORAPHOBIA 08/07/2008   TOBACCO ABUSE 08/07/2008   BACK  PAIN, THORACIC REGION 07/07/2007   ALLERGIC RHINITIS 06/02/2007   Home Medication(s) Prior to Admission medications   Medication Sig Start Date End Date Taking? Authorizing Provider  clonazePAM (KLONOPIN) 0.5 MG tablet Take 0.5 mg by mouth 2 (two) times daily.  08/30/18   [provider]  cyclobenzaprine (FLEXERIL) 10 MG tablet Take 10 mg by mouth 3 (three) times daily as needed for muscle spasms.  01/19/19   [provider]  escitalopram (LEXAPRO) 20 MG tablet Take 30 mg by mouth at bedtime. 12/20/18   [provider]  HYDROcodone-acetaminophen (NORCO) 10-325 MG tablet Take 1 tablet by mouth every 6 (six) hours as needed for moderate pain. 09/14/22   [place] Eric J, DO  LORazepam (ATIVAN) 1 MG tablet Take 1 tablet (1 mg total) by mouth every 8 (eight) hours as needed for anxiety. 09/14/22 09/14/23  [place] Donnamarie Poag, DO  promethazine (PHENERGAN) 25 MG tablet Take 1 tablet (25 mg total) by mouth every 6 (six) hours as needed for nausea or vomiting. 09/14/22   [place] Donnamarie Poag, DO                                                                                                                                    Allergies Lamictal [lamotrigine], Morphine and related, Nicotine, Zofran, Chantix [varenicline tartrate], Codeine, Haldol [haloperidol], Ibuprofen, Lidoderm [lidocaine], Metaxalone, Sulfa antibiotics, Sulfasalazine, Toradol [ketorolac tromethamine], Duloxetine, Duloxetine hcl, Gabapentin, Ingrezza [valbenazine tosylate], Methadone, Pregabalin, Tramadol, Zolpidem, Zolpidem tartrate, Amoxicillin, Aripiprazole, Penicillins, and Valbenazine  Review of Systems Review of Systems As noted in HPI  Physical Exam Vital Signs  I have reviewed the triage vital signs BP 108/65   Pulse 63   Temp 98.6 F (37 C) (Oral)   Resp 16   LMP 01/01/2004   SpO2 96%   Physical Exam Vitals reviewed.  Constitutional:      General: She is not in acute distress.    Appearance: She is  well-developed. She is not diaphoretic.  HENT:     Head: Normocephalic and atraumatic.     Nose: Nose normal.  Eyes:     General: No scleral icterus.       Right eye: No discharge.        Left eye: No discharge.     Conjunctiva/sclera: Conjunctivae normal.     Pupils: Pupils are equal, round, and reactive to light.  Cardiovascular:     Rate and Rhythm: Normal rate and regular rhythm.     Heart sounds: No murmur heard.    No friction rub. No gallop.  Pulmonary:     Effort: Pulmonary effort is normal. No respiratory distress.     Breath sounds:  Normal breath sounds. No stridor. No rales.  Abdominal:     General: There is distension.     Palpations: Abdomen is soft.     Tenderness: There is abdominal tenderness in the right lower quadrant, suprapubic area and left lower quadrant. There is no guarding or rebound.     Hernia: No hernia is present.  Musculoskeletal:        General: No tenderness.     Cervical back: Normal range of motion and neck supple.  Skin:    General: Skin is warm and dry.     Findings: No erythema or rash.  Neurological:     Mental Status: She is alert and oriented to person, place, and time.     ED Results and Treatments Labs (all labs ordered are listed, but only abnormal results are displayed) Labs Reviewed  COMPREHENSIVE METABOLIC PANEL - Abnormal; Notable for the following components:      Result Value   Glucose, Bld 104 (*)    Calcium 8.6 (*)    Anion gap 4 (*)    All other components within normal limits  CBC  LIPASE, BLOOD  URINALYSIS, ROUTINE W REFLEX MICROSCOPIC  HCG, QUANTITATIVE, PREGNANCY                                                                                                                         EKG  EKG Interpretation  Date/Time:    Ventricular Rate:    PR Interval:    QRS Duration:   QT Interval:    QTC Calculation:   R Axis:     Text Interpretation:         Radiology US Pelvis Complete  Result Date:  09/22/2022 CLINICAL DATA:  Acute pelvic pain EXAM: TRANSABDOMINAL AND TRANSVAGINAL ULTRASOUND OF PELVIS TECHNIQUE: Both transabdominal and transvaginal ultrasound examinations of the pelvis were performed. Transabdominal technique was performed for global imaging of the pelvis including uterus, ovaries, adnexal regions, and pelvic cul-de-sac. It was necessary to proceed with endovaginal exam following the transabdominal exam to visualize the ovaries and adnexa. COMPARISON:  CT 09/21/2022 FINDINGS: Uterus Measurements: Prior hysterectomy Endometrium Thickness: Prior hysterectomy. Right ovary Measurements: Not visualized.  No adnexal mass seen. Left ovary Measurements: Not visualized.  No adnexal mass seen. Other findings No abnormal free fluid. IMPRESSION: Prior hysterectomy. Neither ovary visualized.  No adnexal mass seen. Electronically Signed   By: Rolm Baptise M.D.   On: 09/22/2022 02:37   US Transvaginal Non-OB  Result Date: 09/22/2022 CLINICAL DATA:  Acute pelvic pain EXAM: TRANSABDOMINAL AND TRANSVAGINAL ULTRASOUND OF PELVIS TECHNIQUE: Both transabdominal and transvaginal ultrasound examinations of the pelvis were performed. Transabdominal technique was performed for global imaging of the pelvis including uterus, ovaries, adnexal regions, and pelvic cul-de-sac. It was necessary to proceed with endovaginal exam following the transabdominal exam to visualize the ovaries and adnexa. COMPARISON:  CT 09/21/2022 FINDINGS: Uterus Measurements: Prior hysterectomy Endometrium Thickness: Prior hysterectomy. Right ovary Measurements: Not visualized.  No adnexal mass  seen. Left ovary Measurements: Not visualized.  No adnexal mass seen. Other findings No abnormal free fluid. IMPRESSION: Prior hysterectomy. Neither ovary visualized.  No adnexal mass seen. Electronically Signed   By: Rolm Baptise M.D.   On: 09/22/2022 02:37   CT ABDOMEN PELVIS W CONTRAST  Result Date: 09/21/2022 CLINICAL DATA:  Recently admitted  for SBO. Says she had NG tube for several days. Today has subjective abdominal swelling, nausea, vomiting and pain. Bowel obstruction suspected EXAM: CT ABDOMEN AND PELVIS WITH CONTRAST TECHNIQUE: Multidetector CT imaging of the abdomen and pelvis was performed using the standard protocol following bolus administration of intravenous contrast. RADIATION DOSE REDUCTION: This exam was performed according to the departmental dose-optimization program which includes automated exposure control, adjustment of the mA and/or kV according to patient size and/or use of iterative reconstruction technique. CONTRAST:  14m OMNIPAQUE IOHEXOL 300 MG/ML  SOLN COMPARISON:  CT abdomen pelvis 09/14/2022 FINDINGS: Lower chest: No acute abnormality. Hepatobiliary: No focal liver abnormality. Status post cholecystectomy. No biliary dilatation. Pancreas: No focal lesion. Normal pancreatic contour. No surrounding inflammatory changes. No main pancreatic ductal dilatation. Spleen: Normal in size without focal abnormality. Adrenals/Urinary Tract: No adrenal nodule bilaterally. Bilateral kidneys enhance symmetrically. Subcentimeter hypodensity too small to characterize-no further follow-up indicated. No hydronephrosis. No hydroureter. The urinary bladder is unremarkable. Stomach/Bowel: Stomach is within normal limits. No evidence of bowel wall thickening or dilatation. Inserted the colon is collapsed. The appendix is not definitely identified with no inflammatory changes in the right lower quadrant to suggest acute appendicitis. Vascular/Lymphatic: No abdominal aorta or iliac aneurysm. No abdominal, pelvic, or inguinal lymphadenopathy. Reproductive: Status post hysterectomy. No adnexal masses. Other: No intraperitoneal free fluid. No intraperitoneal free gas. No organized fluid collection. Musculoskeletal: No abdominal wall hernia or abnormality. No suspicious lytic or blastic osseous lesions. No acute displaced fracture. Multilevel  degenerative changes of the spine. IMPRESSION: No acute intra-abdominal or intrapelvic abnormality. Electronically Signed   By: MIven FinnM.D.   On: 09/21/2022 22:21    Medications Ordered in ED Medications  fentaNYL (SUBLIMAZE) injection 50 mcg (50 mcg Intravenous Given 09/21/22 2057)  iohexol (OMNIPAQUE) 300 MG/ML solution 100 mL (100 mLs Intravenous Contrast Given 09/21/22 2153)  sodium chloride 0.9 % bolus 1,000 mL (0 mLs Intravenous Stopped 09/22/22 0358)  hyoscyamine (ANASPAZ) disintergrating tablet 0.25 mg (0.25 mg Oral Given 09/22/22 0238)  fentaNYL (SUBLIMAZE) injection 50 mcg (50 mcg Intravenous Given 09/22/22 0206)  promethazine (PHENERGAN) 12.5 mg in sodium chloride 0.9 % 50 mL IVPB (0 mg Intravenous Stopped 09/22/22 0358)  HYDROmorphone (DILAUDID) injection 0.5 mg (0.5 mg Intravenous Given 09/22/22 0406)  droperidol (INAPSINE) 2.5 MG/ML injection 1.25 mg (1.25 mg Intravenous Given 09/22/22 0406)                                                                                                                                     Procedures Procedures  (including critical care  time)  Medical Decision Making / ED Course   Medical Decision Making Amount and/or Complexity of Data Reviewed External Data Reviewed: notes.    Details: Prior admission mentioned in HPI Labs: ordered. Decision-making details documented in ED Course. Radiology: ordered and independent interpretation performed. Decision-making details documented in ED Course.  Risk Prescription drug management. Parenteral controlled substances. Drug therapy requiring intensive monitoring for toxicity. Decision regarding hospitalization.    Abdominal pain Differential includes small bowel obstruction, intra-abdominal inflammatory/infectious process, UTI, torsion.  Patient provided with IV fluids, antiemetics and pain medicine  CBC without leukocytosis or anemia Metabolic panel without significant electrolyte  derangement or renal sufficiency.  No evidence of bili obstruction or pancreatitis. UA without evidence of infection CT scan negative for obstruction or intra-abdominal inflammatory/infectious processes. Ultrasound not consistent with torsion.  Given additional meds that controlled her pain. Tolerated Po.      Final Clinical Impression(s) / ED Diagnoses Final diagnoses:  Lower abdominal pain   The patient appears reasonably screened and/or stabilized for discharge and I doubt any other medical condition or other [facility] requiring further screening, evaluation, or treatment in the ED at this time. I have discussed the findings, Dx and Tx plan with the patient/family who expressed understanding and agree(s) with the plan. Discharge instructions discussed at length. The patient/family was given strict return precautions who verbalized understanding of the instructions. No further questions at time of discharge.  Disposition: Discharge  Condition: Good  ED Discharge Orders     None         Follow Up: Velna Hatchet, [place] [address] [phone]  Call  to schedule an appointment for close follow up            This chart was dictated using voice recognition software.  Despite best efforts to proofread,  errors can occur which can change the documentation meaning.    Fatima Blank, MD 09/22/22 [phone]

## 2022-10-05 DIAGNOSIS — F3012 Manic episode without psychotic symptoms, moderate: Secondary | ICD-10-CM | POA: Diagnosis not present

## 2022-10-05 DIAGNOSIS — F4312 Post-traumatic stress disorder, chronic: Secondary | ICD-10-CM | POA: Diagnosis not present

## 2022-10-12 DIAGNOSIS — Z3202 Encounter for pregnancy test, result negative: Secondary | ICD-10-CM | POA: Diagnosis not present

## 2022-10-12 DIAGNOSIS — R1084 Generalized abdominal pain: Secondary | ICD-10-CM | POA: Diagnosis not present

## 2022-10-12 DIAGNOSIS — R197 Diarrhea, unspecified: Secondary | ICD-10-CM | POA: Diagnosis not present

## 2022-10-12 DIAGNOSIS — R112 Nausea with vomiting, unspecified: Secondary | ICD-10-CM | POA: Diagnosis not present

## 2022-10-14 DIAGNOSIS — R9431 Abnormal electrocardiogram [ECG] [EKG]: Secondary | ICD-10-CM | POA: Diagnosis not present

## 2022-10-15 DIAGNOSIS — K644 Residual hemorrhoidal skin tags: Secondary | ICD-10-CM | POA: Diagnosis not present

## 2022-10-15 DIAGNOSIS — K529 Noninfective gastroenteritis and colitis, unspecified: Secondary | ICD-10-CM | POA: Diagnosis not present

## 2022-10-20 DIAGNOSIS — I498 Other specified cardiac arrhythmias: Secondary | ICD-10-CM | POA: Diagnosis not present

## 2022-11-04 ENCOUNTER — Encounter (HOSPITAL_COMMUNITY): Payer: Self-pay

## 2022-11-04 ENCOUNTER — Emergency Department (HOSPITAL_COMMUNITY)
Admission: EM | Admit: 2022-11-04 | Discharge: 2022-11-04 | Disposition: A | Discharge status: Home / Self Care | Payer: Medicare Other | Attending: Emergency Medicine | Admitting: Emergency Medicine

## 2022-11-04 ENCOUNTER — Emergency Department (HOSPITAL_COMMUNITY): Payer: Medicare Other

## 2022-11-04 ENCOUNTER — Other Ambulatory Visit: Payer: Self-pay

## 2022-11-04 DIAGNOSIS — E039 Hypothyroidism, unspecified: Secondary | ICD-10-CM | POA: Insufficient documentation

## 2022-11-04 DIAGNOSIS — R0789 Other chest pain: Secondary | ICD-10-CM | POA: Insufficient documentation

## 2022-11-04 DIAGNOSIS — E860 Dehydration: Secondary | ICD-10-CM | POA: Diagnosis not present

## 2022-11-04 LAB — I-STAT BETA HCG BLOOD, ED (MC, WL, AP ONLY): I-stat hCG, quantitative: <5 m[IU]/mL (ref <5)

## 2022-11-04 LAB — BASIC METABOLIC PANEL
Anion gap: 6 (ref 5–15)
BUN: 7 mg/dL (ref 6–20)
CO2: 26 mmol/L (ref 22–32)
Calcium: 8.9 mg/dL (ref 8.9–10.3)
Chloride: 104 mmol/L (ref 98–111)
Creatinine, Ser: 0.53 mg/dL (ref 0.44–1.00)
GFR, Estimated: >60 mL/min (ref >60)
Glucose, Bld: 110 mg/dL — ABNORMAL HIGH (ref 70–99)
Potassium: 4 mmol/L (ref 3.5–5.1)
Sodium: 136 mmol/L (ref 135–145)

## 2022-11-04 LAB — CBC
HCT: 43.8 % (ref 36.0–46.0)
Hemoglobin: 14.4 g/dL (ref 12.0–15.0)
MCH: 32.7 pg (ref 26.0–34.0)
MCHC: 32.9 g/dL (ref 30.0–36.0)
MCV: 99.3 fL (ref 80.0–100.0)
Platelets: 272 10*3/uL (ref 150–400)
RBC: 4.41 MIL/uL (ref 3.87–5.11)
RDW: 12.4 % (ref 11.5–15.5)
WBC: 6.8 10*3/uL (ref 4.0–10.5)
nRBC: 0 % (ref 0.0–0.2)

## 2022-11-04 LAB — TROPONIN I (HIGH SENSITIVITY)
Troponin I (High Sensitivity): <2 ng/L (ref <18)
Troponin I (High Sensitivity): 4 ng/L (ref <18)

## 2022-11-04 LAB — TSH: TSH: 2.489 u[IU]/mL (ref 0.350–4.500)

## 2022-11-04 MED ORDER — ALUM & MAG HYDROXIDE-SIMETH 200-200-20 MG/5ML PO SUSP
30.0000 mL | Freq: Once | ORAL | Status: AC
  Administered 2022-11-04: 30 mL via ORAL
  Filled 2022-11-04: qty 30

## 2022-11-04 MED ORDER — DIPHENHYDRAMINE HCL 50 MG/ML IJ SOLN
12.5000 mg | Freq: Once | INTRAMUSCULAR | Status: AC
  Administered 2022-11-04: 12.5 mg via INTRAVENOUS
  Filled 2022-11-04: qty 1

## 2022-11-04 MED ORDER — DROPERIDOL 2.5 MG/ML IJ SOLN
1.2500 mg | Freq: Once | INTRAMUSCULAR | Status: AC
  Administered 2022-11-04: 1.25 mg via INTRAVENOUS
  Filled 2022-11-04: qty 2

## 2022-11-04 MED ORDER — SODIUM CHLORIDE 0.9 % IV BOLUS
1000.0000 mL | Freq: Once | INTRAVENOUS | Status: AC
  Administered 2022-11-04: 1000 mL via INTRAVENOUS

## 2022-11-04 NOTE — Discharge Instructions (Addendum)
Gi cocktail:  30 mL of maalox or mylanta with simethicone (gas-x)

## 2022-11-04 NOTE — ED Provider Notes (Signed)
[facility] EMERGENCY DEPARTMENT Provider Note   CSN: [id number] Arrival date & time: 11/04/22  1642     History  Chief Complaint  Patient presents with   Chest Pain    Gina [name] is a 47 y.o. female.  Pt is a 47 yo female with a pmhx significant for multiple abd surgeries and sbo, anxiety, chronic pain, fibromyalgia and bipolar d/o.  Pt was admitted to [facility] from 11/10-14 for a partial SBO.  She went to the ED at [facility] on 11/21 and had a repeat CT and [place] which was nl.  She went to the ED at [facility] on 12/12 and had a repeat abd ct which did not show anything acute.  She has had continued abd pain with nausea and diarrhea.  On Tuesday (1/2), she had a syncopal event with standing.  She was caught by her husband and did not hurt herself.  Pt said she was in the bed most of the day yesterday.  Today, she had a doctor's appt and had blood work.  This afternoon, she developed a lot of chest pressure.  She said she's had a lot of burping.         Home Medications Prior to Admission medications   Medication Sig Start Date End Date Taking? Authorizing Provider  clonazePAM (KLONOPIN) 0.5 MG tablet Take 0.5 mg by mouth 2 (two) times daily.  08/30/18   [provider]  cyclobenzaprine (FLEXERIL) 10 MG tablet Take 10 mg by mouth 3 (three) times daily as needed for muscle spasms.  01/19/19   [provider]  escitalopram (LEXAPRO) 20 MG tablet Take 30 mg by mouth at bedtime. 12/20/18   [provider]  HYDROcodone-acetaminophen (NORCO) 10-325 MG tablet Take 1 tablet by mouth every 6 (six) hours as needed for moderate pain. 09/14/22   [place] Eric J, DO  LORazepam (ATIVAN) 1 MG tablet Take 1 tablet (1 mg total) by mouth every 8 (eight) hours as needed for anxiety. 09/14/22 09/14/23  [place] Donnamarie Poag, DO  promethazine (PHENERGAN) 25 MG tablet Take 1 tablet (25 mg total) by mouth every 6 (six) hours as needed for nausea or vomiting. 09/14/22   [place]  Donnamarie Poag, DO      Allergies    Lamictal [lamotrigine], Morphine and related, Nicotine, Zofran, Chantix [varenicline tartrate], Codeine, Haldol [haloperidol], Ibuprofen, Lidoderm [lidocaine], Metaxalone, Sulfa antibiotics, Sulfasalazine, Toradol [ketorolac tromethamine], Duloxetine, Duloxetine hcl, Gabapentin, Ingrezza [valbenazine tosylate], Methadone, Pregabalin, Tramadol, Zolpidem, Zolpidem tartrate, Amoxicillin, Aripiprazole, Penicillins, and Valbenazine    Review of Systems   Review of Systems  Gastrointestinal:  Positive for abdominal pain and nausea.  All other systems reviewed and are negative.   Physical Exam Updated Vital Signs BP 118/76   Pulse 80   Temp 98.6 F (37 C) (Oral)   Resp 16   Ht '5\' 7"'$  (1.702 m)   Wt 82.1 kg   LMP 01/01/2004   SpO2 99%   BMI 28.35 kg/m  Physical Exam Vitals and nursing note reviewed.  Constitutional:      Appearance: She is well-developed.  HENT:     Head: Normocephalic and atraumatic.  Eyes:     Extraocular Movements: Extraocular movements intact.     Pupils: Pupils are equal, round, and reactive to light.  Cardiovascular:     Rate and Rhythm: Normal rate and regular rhythm.     Heart sounds: Normal heart sounds.  Pulmonary:     Effort: Pulmonary effort is normal.  Breath sounds: Normal breath sounds.  Abdominal:     General: Bowel sounds are normal.     Palpations: Abdomen is soft.  Musculoskeletal:        General: Normal range of motion.     Cervical back: Normal range of motion and neck supple.  Skin:    General: Skin is warm.     Capillary Refill: Capillary refill takes less than 2 seconds.  Neurological:     General: No focal deficit present.     Mental Status: She is alert and oriented to person, place, and time.  Psychiatric:        Mood and Affect: Mood normal.        Behavior: Behavior normal.     ED Results / Procedures / Treatments   Labs (all labs ordered are listed, but only abnormal results are  displayed) Labs Reviewed  BASIC METABOLIC PANEL - Abnormal; Notable for the following components:      Result Value   Glucose, Bld 110 (*)    All other components within normal limits  CBC  TSH  URINALYSIS, ROUTINE W REFLEX MICROSCOPIC  I-STAT BETA HCG BLOOD, ED ([facility], [facility], [facility] ONLY)  TROPONIN I (HIGH SENSITIVITY)  TROPONIN I (HIGH SENSITIVITY)    EKG EKG Interpretation  Date/Time:  Thursday November 04 2022 16:46:24 EST Ventricular Rate:  79 PR Interval:  118 QRS Duration: 70 QT Interval:  358 QTC Calculation: 410 R Axis:   51 Text Interpretation: Normal sinus rhythm Normal ECG When compared with ECG of 30-Apr-2022 19:37, PREVIOUS ECG IS PRESENT No significant change since last tracing Confirmed by Isla Pence [id number]) on 11/04/2022 8:24:21 PM  Radiology DG Chest 2 View  Result Date: 11/04/2022 CLINICAL DATA:  cp EXAM: CHEST - 2 VIEW COMPARISON:  11/03/2021. FINDINGS: The heart size and mediastinal contours are within normal limits. Both lungs are clear. No pneumothorax or pleural effusion. There are thoracic degenerative changes. IMPRESSION: No active cardiopulmonary disease. Electronically Signed   By: Sammie Bench M.D.   On: 11/04/2022 18:07    Procedures Procedures    Medications Ordered in ED Medications  alum & mag hydroxide-simeth (MAALOX/MYLANTA) 200-200-20 MG/5ML suspension 30 mL (30 mLs Oral Given 11/04/22 2108)  sodium chloride 0.9 % bolus 1,000 mL (1,000 mLs Intravenous New Bag/Given 11/04/22 2107)  droperidol (INAPSINE) 2.5 MG/ML injection 1.25 mg (1.25 mg Intravenous Given 11/04/22 2110)  diphenhydrAMINE (BENADRYL) injection 12.5 mg (12.5 mg Intravenous Given 11/04/22 2110)    ED Course/ Medical Decision Making/ A&P                           Medical Decision Making Amount and/or Complexity of Data Reviewed Labs: ordered. Radiology: ordered.  Risk OTC drugs. Prescription drug management.   This patient presents to the ED for concern of cp, this involves an  extensive number of treatment options, and is a complaint that carries with it a high risk of complications and morbidity.  The differential diagnosis includes cardiac, pulm, gi   Co morbidities that complicate the patient evaluation   multiple abd surgeries and sbo, anxiety, chronic pain, fibromyalgia and bipolar d/o   Additional history obtained:  Additional history obtained from epic chart review External records from outside source obtained and reviewed including husband   Lab Tests:  I Ordered, and personally interpreted labs.  The pertinent results include:  cbc nl, bmp nl, trop nl, preg neg   Imaging Studies ordered:  I ordered  imaging studies including cxr  I independently visualized and interpreted imaging which showed No active cardiopulmonary disease.  I agree with the radiologist interpretation   Cardiac Monitoring:  The patient was maintained on a cardiac monitor.  I personally viewed and interpreted the cardiac monitored which showed an underlying rhythm of: nsr   Medicines ordered and prescription drug management:  I ordered medication including gi cocktail and inapsine  for n/v and pain  Reevaluation of the patient after these medicines showed that the patient improved I have reviewed the patients home medicines and have made adjustments as needed   Test Considered:  ct   Critical Interventions:  Pain control   Problem List / ED Course:  CP:  Cardiac eval neg.  Likely GI.  Sx gone after gi cocktail and inapsine.  Pt wants to go home.  She is stable for d/c.  Return if worse.  F/u with pcp.   Reevaluation:  After the interventions noted above, I reevaluated the patient and found that they have :improved   Social Determinants of Health:  Lives at home   Dispostion:  After consideration of the diagnostic results and the patients response to treatment, I feel that the patent would benefit from discharge with outpatient f/u.           Final Clinical Impression(s) / ED Diagnoses Final diagnoses:  Atypical chest pain  Dehydration    Rx / DC Orders ED Discharge Orders     None         Isla Pence, MD 11/04/22 2228

## 2022-11-04 NOTE — ED Triage Notes (Addendum)
Pt arrived via [facility] from home for sharp left sided chest pain with left arm numbness that started last night. Abdominal pain, loss of appetite, body chillsx2d. [id number] seeing dr for that. Pt states she stood up and got lightheaded and had a syncopal episode. Pt states her husband caught her.

## 2022-11-18 ENCOUNTER — Emergency Department (HOSPITAL_COMMUNITY)
Admission: EM | Admit: 2022-11-18 | Discharge: 2022-11-18 | Disposition: A | Discharge status: Home / Self Care | Payer: Medicare Other | Attending: Emergency Medicine | Admitting: Emergency Medicine

## 2022-11-18 ENCOUNTER — Emergency Department (HOSPITAL_COMMUNITY): Payer: Medicare Other

## 2022-11-18 DIAGNOSIS — R1032 Left lower quadrant pain: Secondary | ICD-10-CM | POA: Insufficient documentation

## 2022-11-18 DIAGNOSIS — R1915 Other abnormal bowel sounds: Secondary | ICD-10-CM | POA: Insufficient documentation

## 2022-11-18 DIAGNOSIS — R112 Nausea with vomiting, unspecified: Secondary | ICD-10-CM | POA: Diagnosis not present

## 2022-11-18 DIAGNOSIS — R194 Change in bowel habit: Secondary | ICD-10-CM

## 2022-11-18 DIAGNOSIS — R1084 Generalized abdominal pain: Secondary | ICD-10-CM | POA: Diagnosis present

## 2022-11-18 LAB — COMPREHENSIVE METABOLIC PANEL
ALT: 21 U/L (ref 0–44)
AST: 22 U/L (ref 15–41)
Albumin: 4.2 g/dL (ref 3.5–5.0)
Alkaline Phosphatase: 81 U/L (ref 38–126)
Anion gap: 9 (ref 5–15)
BUN: 11 mg/dL (ref 6–20)
CO2: 23 mmol/L (ref 22–32)
Calcium: 9.3 mg/dL (ref 8.9–10.3)
Chloride: 106 mmol/L (ref 98–111)
Creatinine, Ser: 0.62 mg/dL (ref 0.44–1.00)
GFR, Estimated: >60 mL/min (ref >60)
Glucose, Bld: 96 mg/dL (ref 70–99)
Potassium: 3.9 mmol/L (ref 3.5–5.1)
Sodium: 138 mmol/L (ref 135–145)
Total Bilirubin: 0.7 mg/dL (ref 0.3–1.2)
Total Protein: 6.9 g/dL (ref 6.5–8.1)

## 2022-11-18 LAB — I-STAT BETA HCG BLOOD, ED (MC, WL, AP ONLY): I-stat hCG, quantitative: <5 m[IU]/mL (ref <5)

## 2022-11-18 LAB — CBC WITH DIFFERENTIAL/PLATELET
Abs Immature Granulocytes: 0.02 10*3/uL (ref 0.00–0.07)
Basophils Absolute: 0.1 10*3/uL (ref 0.0–0.1)
Basophils Relative: 1 %
Eosinophils Absolute: 0.2 10*3/uL (ref 0.0–0.5)
Eosinophils Relative: 2 %
HCT: 44.9 % (ref 36.0–46.0)
Hemoglobin: 15.1 g/dL — ABNORMAL HIGH (ref 12.0–15.0)
Immature Granulocytes: 0 %
Lymphocytes Relative: 34 %
Lymphs Abs: 2.8 10*3/uL (ref 0.7–4.0)
MCH: 32.1 pg (ref 26.0–34.0)
MCHC: 33.6 g/dL (ref 30.0–36.0)
MCV: 95.5 fL (ref 80.0–100.0)
Monocytes Absolute: 0.8 10*3/uL (ref 0.1–1.0)
Monocytes Relative: 9 %
Neutro Abs: 4.5 10*3/uL (ref 1.7–7.7)
Neutrophils Relative %: 54 %
Platelets: 233 10*3/uL (ref 150–400)
RBC: 4.7 MIL/uL (ref 3.87–5.11)
RDW: 12 % (ref 11.5–15.5)
WBC: 8.2 10*3/uL (ref 4.0–10.5)
nRBC: 0 % (ref 0.0–0.2)

## 2022-11-18 LAB — LIPASE, BLOOD: Lipase: 31 U/L (ref 11–51)

## 2022-11-18 LAB — I-STAT CHEM 8, ED
BUN: 11 mg/dL (ref 6–20)
Calcium, Ion: 1.12 mmol/L — ABNORMAL LOW (ref 1.15–1.40)
Chloride: 105 mmol/L (ref 98–111)
Creatinine, Ser: 0.4 mg/dL — ABNORMAL LOW (ref 0.44–1.00)
Glucose, Bld: 93 mg/dL (ref 70–99)
HCT: 44 % (ref 36.0–46.0)
Hemoglobin: 15 g/dL (ref 12.0–15.0)
Potassium: 4 mmol/L (ref 3.5–5.1)
Sodium: 139 mmol/L (ref 135–145)
TCO2: 21 mmol/L — ABNORMAL LOW (ref 22–32)

## 2022-11-18 LAB — LACTIC ACID, PLASMA: Lactic Acid, Venous: 1.4 mmol/L (ref 0.5–1.9)

## 2022-11-18 MED ORDER — SODIUM CHLORIDE 0.9 % IV SOLN
12.5000 mg | Freq: Four times a day (QID) | INTRAVENOUS | Status: DC | PRN
  Administered 2022-11-18: 12.5 mg via INTRAVENOUS
  Filled 2022-11-18: qty 12.5

## 2022-11-18 MED ORDER — HYDROMORPHONE HCL 1 MG/ML IJ SOLN
1.0000 mg | Freq: Once | INTRAMUSCULAR | Status: AC
  Administered 2022-11-18: 1 mg via INTRAVENOUS
  Filled 2022-11-18: qty 1

## 2022-11-18 MED ORDER — IOHEXOL 350 MG/ML SOLN
75.0000 mL | Freq: Once | INTRAVENOUS | Status: AC | PRN
  Administered 2022-11-18: 75 mL via INTRAVENOUS

## 2022-11-18 MED ORDER — METOCLOPRAMIDE HCL 10 MG PO TABS: 10.0000 mg | ORAL_TABLET | Freq: Once | ORAL | Status: DC

## 2022-11-18 MED ORDER — LACTATED RINGERS IV BOLUS
1000.0000 mL | Freq: Once | INTRAVENOUS | Status: AC
  Administered 2022-11-18: 1000 mL via INTRAVENOUS

## 2022-11-18 MED ORDER — LORAZEPAM 1 MG PO TABS
1.0000 mg | ORAL_TABLET | Freq: Once | ORAL | Status: DC
  Filled 2022-11-18: qty 1

## 2022-11-18 MED ORDER — OXYCODONE HCL 5 MG PO TABS
10.0000 mg | ORAL_TABLET | ORAL | Status: DC
  Filled 2022-11-18: qty 2

## 2022-11-18 NOTE — Discharge Instructions (Signed)
Your labs and imaging today did not reveal significant abnormalities that require admission at this time.  I spoke to your surgeon Dr. Florene Glen myself who reviewed all of your workup and agrees with discharge home and outpatient workup continuing.  Please try to rest and stay hydrated as best you can.

## 2022-11-18 NOTE — ED Triage Notes (Addendum)
Patient here with complaint of abdominal pain, constipation, and emesis that started yesterday, history of bowel obstruction a few months ago. Patient states she has had chronic diarrhea for several months but three days ago stopped having bowel movements. Patient is alert, oriented, and ill-appearing at this time.

## 2022-11-18 NOTE — ED Provider Triage Note (Signed)
Emergency Medicine Provider Triage Evaluation Note  Gina [name] , a 47 y.o. female  was evaluated in triage.  Pt complains of diffuse abdominal pain for the past 3 days.  Reports that usually she has constant diarrhea although has not a bowel movement 3 days.  Reports that she did have a small biloma this morning however.  Hard time passing gas.  She started having multiple episodes of nausea and vomiting this morning.  She reports that she started having some abdominal bloating yesterday.  She reports that this feels similar to her bowel obstruction in November.  Review of Systems  Positive:  Negative:   Physical Exam  BP 115/85 (BP Location: Right Arm)   Pulse 96   Temp 98.7 F (37.1 C) (Oral)   Resp 18   LMP 01/01/2004   SpO2 94%  Gen:   Awake, no distress   Resp:  Normal effort  MSK:   Moves extremities without difficulty  Other:  Distended abdomen with diffuse tenderness.  Medical Decision Making  Medically screening exam initiated at 1:12 PM.  Appropriate orders placed.  Gina [name] was informed that the remainder of the evaluation will be completed by another provider, this initial triage assessment does not replace that evaluation, and the importance of remaining in the ED until their evaluation is complete.  Charge nurse, [name] alerted the patient needs a room immediately.  Concern for another small bowel obstruction.  Vital signs are stable at this time.  She will likely need an NG tube.  Trouble with getting access for blood due to patient's likely dehydration.  Will need ultrasound IV.   Sherrell Puller, PA-C 11/18/22 1314

## 2022-11-18 NOTE — ED Provider Notes (Signed)
4:39 PM Care assumed Dr. Sharlett Iles.  At time of transfer of care, patient awaiting transvaginal ultrasound to rule out ovarian torsion.  Patient has a history of abdominal pain after multiple abdominal surgeries in the past.  Patient had a CT scan that did not show any concerning finding specifically no acute evidence of bowel obstruction.  Per previous team's plan, if ultrasound reassuring, she will be discharged home.  Per previous plan, will give IV nausea medicine with Phenergan but will do oral pain medicine.   If workup is reassuring, plan of care will be to hold on narcotic pain medicine prescription at this time.  5:53 PM Patient refused ultrasound until she got IV pain medicine.  As we are getting ultrasound to rule out a potentially surgical problem with torsion I went to go talk to her to tell her I was going to give her 1 dose of pain medicine prior to ultrasound.  She said that she is concerned that if the ultrasound does not show something she would be discharged.  I went through all of her labs and workup thus far that were grossly reassuring with CT showing no evidence of obstruction, labs show mild dehydration but she is getting fluids at this time.  No evidence of AKI.  No evidence of acute surgical problem.  Patient is insistent that I call her surgeon Dr. Cathlean Sauer at [facility] before 6:00 to discuss the plan.  She wants to be admitted for exploratory surgery.  I informed her that I have a low suspicion this would be the plan.  I told her I will be happy to try to call and speak to her surgeon.  Will attempt to call her surgeon.   Had a long discussion with her surgeon Dr. Florene Glen at [facility].  He does not think she needs admission either here or at [facility] at this time.  He does not think she needs emergent surgery for her abdominal symptoms.  He reports that there are no beds at [facility] even if she did need admission.    Patient's ultrasound  returned without evidence of acute torsion.  I even called the radiologist who read it and reviewed the case and she says based on her review of both the ultrasound and the CT scan she does not see any evidence of left-sided torsion despite it initially not being seen well on the initial read.  I explained this at length to the patient and family and that we will not be able to admit her as her vitals and labs and workup is reassuring.  She needs to follow-up with her PCP and outpatient team as her surgeon told us himself.  She reports she has nausea medicine at home and we will discharge her.   Clinical Impression: 1. Left lower quadrant abdominal pain   2. Nausea and vomiting, unspecified vomiting type   3. Decreased frequency of bowel movements     Disposition: Discharge  Condition: Good  I have discussed the results, Dx and Tx plan with the pt(& family if present). He/she/they expressed understanding and agree(s) with the plan. Discharge instructions discussed at great length. Strict return precautions discussed and pt &/or family have verbalized understanding of the instructions. No further questions at time of discharge.    New Prescriptions   No medications on file    Follow Up: Velna Hatchet, [place] [address] [phone]     Birdena Jubilee, MD MEDICAL  [place] [address] [facility] EM[facility]GENCY DEPARTMENT [address] [id number] mc [place] [phone]        Rolande Moe, Gwenyth Allegra, MD 11/18/22 2120

## 2022-11-18 NOTE — ED Notes (Signed)
AVS reviewed with pt and husband prior to discharge. Pt verbalizes understanding. Belongings with pt upon depart. Wheelchair taken to [place] with husband driving.

## 2022-11-18 NOTE — ED Provider Notes (Signed)
[facility] EMERGENCY DEPARTMENT Provider Note   CSN: [id number] Arrival date & time: 11/18/22  1246     History  Chief Complaint  Patient presents with   Abdominal Pain    Gina [name] is a 47 y.o. female.  47 year old female multiple abdominal surgeries including abdominal wall hernia with multiple repairs, reported ovarian torsion status postrepair, small bowel obstruction, and fibromyalgia who presents to the emergency department with abdominal pain and nausea and vomiting.  Reports that Gina [name] was having diarrhea up until 3 days ago when Gina [name] stopped having bowel movements.  Says that in that time Gina [name] started having abdominal distention and pain.  Says that Gina [name] tried to stay at home but today the pain was too severe so Gina [name] came in.  Vomiting has been nonbloody nonbilious.  Did pass small amount of flatus this morning but none since.  Says that Gina [name] has diffuse abdominal pain that is worse on the left side.  Denies any dysuria or frequency.  Has had hot flashes but no recorded fevers at home.       Home Medications Prior to Admission medications   Medication Sig Start Date End Date Taking? Authorizing Provider  escitalopram (LEXAPRO) 20 MG tablet Take 30 mg by mouth at bedtime. 12/20/18  Yes [provider]  HYDROcodone-acetaminophen (NORCO) 10-325 MG tablet Take 1 tablet by mouth every 6 (six) hours as needed for moderate pain. 09/14/22  Yes [place] Eric J, DO  LORazepam (ATIVAN) 1 MG tablet Take 1 tablet (1 mg total) by mouth every 8 (eight) hours as needed for anxiety. 09/14/22 09/14/23 Yes [place] Eric J, DO  promethazine (PHENERGAN) 25 MG tablet Take 1 tablet (25 mg total) by mouth every 6 (six) hours as needed for nausea or vomiting. Patient not taking: Reported on 11/18/2022 09/14/22   [place] Donnamarie Poag, DO      Allergies    Lamictal [lamotrigine], Morphine and related, Nicotine, Zofran, Chantix [varenicline tartrate], Codeine, Haldol  [haloperidol], Ibuprofen, Lidoderm [lidocaine], Metaxalone, Sulfa antibiotics, Sulfasalazine, Toradol [ketorolac tromethamine], Duloxetine, Duloxetine hcl, Gabapentin, Ingrezza [valbenazine tosylate], Methadone, Pregabalin, Tramadol, Zolpidem, Zolpidem tartrate, Amoxicillin, Aripiprazole, Penicillins, and Valbenazine    Review of Systems   Review of Systems  Physical Exam Updated Vital Signs BP 111/71   Pulse 76   Temp 98.5 F (36.9 C) (Oral)   Resp 12   LMP 01/01/2004   SpO2 100%  Physical Exam Vitals and nursing note reviewed.  Constitutional:      Appearance: Gina [name] is well-developed. Gina [name] is ill-appearing. Gina [name] is not toxic-appearing or diaphoretic.     Comments: Retching during exam.  Asking for pain medication and nausea medication.  HENT:     Head: Normocephalic and atraumatic.     Right Ear: External ear normal.     Left Ear: External ear normal.     Nose: Nose normal.  Eyes:     Extraocular Movements: Extraocular movements intact.     Conjunctiva/sclera: Conjunctivae normal.     Pupils: Pupils are equal, round, and reactive to light.  Cardiovascular:     Rate and Rhythm: Normal rate and regular rhythm.     Heart sounds: No murmur heard. Pulmonary:     Effort: Pulmonary effort is normal. No respiratory distress.     Breath sounds: Normal breath sounds.  Abdominal:     General: There is distension.     Palpations: Abdomen is soft. There is no mass.     Tenderness: There is abdominal tenderness (Diffuse). There  is no guarding.  Musculoskeletal:     Cervical back: Normal range of motion and neck supple.     Right lower leg: No edema.     Left lower leg: No edema.  Skin:    General: Skin is warm and dry.  Neurological:     Mental Status: Gina [name] is alert and oriented to person, place, and time. Mental status is at baseline.  Psychiatric:        Mood and Affect: Mood normal.     ED Results / Procedures / Treatments   Labs (all labs ordered are listed, but only abnormal  results are displayed) Labs Reviewed  CBC WITH DIFFERENTIAL/PLATELET - Abnormal; Notable for the following components:      Result Value   Hemoglobin 15.1 (*)    All other components within normal limits  I-STAT CHEM 8, ED - Abnormal; Notable for the following components:   Creatinine, Ser 0.40 (*)    Calcium, Ion 1.12 (*)    TCO2 21 (*)    All other components within normal limits  COMPREHENSIVE METABOLIC PANEL  LIPASE, BLOOD  LACTIC ACID, PLASMA  I-STAT BETA HCG BLOOD, ED ([facility], [facility], [facility] ONLY)    EKG None  Radiology US PELVIC COMPLETE W TRANSVAGINAL AND TORSION R/O  Result Date: 11/18/2022 CLINICAL DATA:  [id number] Left lower quadrant abdominal pain [id number] EXAM: TRANSABDOMINAL ULTRASOUND OF PELVIS DOPPLER ULTRASOUND OF OVARIES TECHNIQUE: Transabdominal ultrasound examination of the pelvis was performed including evaluation of the uterus, ovaries, adnexal regions, and pelvic cul-de-sac. Color and duplex Doppler ultrasound was utilized to evaluate blood flow to the ovaries. COMPARISON:  CT abdomen pelvis 11/18/2022, ultrasound pelvis 09/22/2022 FINDINGS: Uterus: Status post hysterectomy. Right ovary Measurements: 2.9 x 2 x 2.5 cm = volume: 7.75. ML. Similar-appearing chronic hyperechoic vascular 1.3 x 1 x 1.5 cm density within the right ovary. Otherwise unremarkable. Left ovary Not visualized. Pulsed Doppler evaluation demonstrates normal low-resistance arterial and venous waveforms in both ovaries. Other: Trace nonspecific free fluid. IMPRESSION: 1. 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 Otherwise right ovary unremarkable. No findings of ovarian torsion. 2. Left ovary not visualized. 3. Status post hysterectomy. Electronically Signed   By: Iven Finn M.D.   On: 11/18/2022 19:02   CT ABDOMEN PELVIS W CONTRAST  Result Date: 11/18/2022 CLINICAL DATA:  Bowel obstruction suspected. EXAM: CT ABDOMEN  AND PELVIS WITH CONTRAST TECHNIQUE: Multidetector CT imaging of the abdomen and pelvis was performed using the standard protocol following bolus administration of intravenous contrast. RADIATION DOSE REDUCTION: This exam was performed according to the departmental dose-optimization program which includes automated exposure control, adjustment of the mA and/or kV according to patient size and/or use of iterative reconstruction technique. CONTRAST:  18m OMNIPAQUE IOHEXOL 350 MG/ML SOLN COMPARISON:  Abdominal radiographs same date. Pelvic ultrasound 09/22/2022 and abdominopelvic CT 09/21/2022. FINDINGS: Lower chest: Clear lung bases. No significant pleural or pericardial effusion. Small hiatal hernia. Hepatobiliary: The liver is normal in density without suspicious focal abnormality. No biliary dilatation status post cholecystectomy. Pancreas: Unremarkable. No pancreatic ductal dilatation or surrounding inflammatory changes. Spleen: Normal in size without focal abnormality. Adrenals/Urinary Tract: Both adrenal glands appear normal. No evidence of urinary tract calculus, suspicious renal lesion or hydronephrosis. Stable subcentimeter cyst posteriorly in the mid right kidney for which no follow-up imaging recommended. The bladder appears unremarkable for its degree of distention. Stomach/Bowel: No enteric contrast administered. The stomach appears unremarkable for its  degree of distension. No evidence of bowel wall thickening, distention or surrounding inflammatory change. The appendix is not clearly seen and may be surgically absent. No pericecal inflammation to suggest appendicitis. Vascular/Lymphatic: There are no enlarged abdominal or pelvic lymph nodes. No acute vascular findings. Variant anatomy at the celiac trunk. Reproductive: Hysterectomy.  No evidence of adnexal mass. Other: Postsurgical changes in the low anterior abdominal wall. No hernia, ascites, free air or focal extraluminal fluid collection.  Musculoskeletal: No acute or significant osseous findings. IMPRESSION: 1. No acute findings or explanation for the patient's symptoms. No evidence of bowel obstruction. 2. Small hiatal hernia. 3. Postsurgical changes as described. Electronically Signed   By: Richardean Sale M.D.   On: 11/18/2022 15:03   DG Abd Portable 1 View  Result Date: 11/18/2022 CLINICAL DATA:  upright, eval for sbo EXAM: PORTABLE ABDOMEN - 1 VIEW COMPARISON:  October 12, 2022 FINDINGS: Limited assessment on single upright radiograph. Incomplete assessment of the pelvis and portions of the lateral abdomen. No dilated loops of bowel are seen. No free air. Visualized lung bases are unremarkable. Status post cholecystectomy. IMPRESSION: Nonobstructive bowel gas pattern. If persistent clinical concern, recommend dedicated cross-sectional imaging. Electronically Signed   By: Valentino Saxon M.D.   On: 11/18/2022 13:56    Procedures Procedures   Medications Ordered in ED Medications  HYDROmorphone (DILAUDID) injection 1 mg (1 mg Intravenous Given 11/18/22 1338)  lactated ringers bolus 1,000 mL (0 mLs Intravenous Stopped 11/18/22 1555)  HYDROmorphone (DILAUDID) injection 1 mg (1 mg Intravenous Given 11/18/22 1417)  iohexol (OMNIPAQUE) 350 MG/ML injection 75 mL (75 mLs Intravenous Contrast Given 11/18/22 1453)  HYDROmorphone (DILAUDID) injection 1 mg (1 mg Intravenous Given 11/18/22 1857)    ED Course/ Medical Decision Making/ A&P                             Medical Decision Making Amount and/or Complexity of Data Reviewed Labs: ordered. Radiology: ordered.  Risk Prescription drug management.   Tykera Skates is a 47 y.o. female with comorbidities that complicate the patient evaluation including several abdominal surgeries including abdominal wall hernia, reported ovarian torsion, small bowel obstruction, and fibromyalgia who presents to the emergency department with abdominal pain and nausea and vomiting  Initial  Ddx:  Small bowel obstruction, ileus, endometriosis  MDM:  Concerned about small bowel obstruction given the patient's multiple abdominal surgeries and history of small bowel obstruction.  Specially with decreased bowel movements and flatus and some mild distention on exam.  Potentially could have an ileus but does not have any known risk factors to this.  If above workup is negative could potentially be due to endometriosis or nonorganic causes of her abdominal pain.  Does appear the patient has presented several times with very reassuring evaluations despite severe symptoms.  Given delays in CT imaging and patient's severe pain will obtain abdominal x-ray to evaluate for bowel obstruction or free air that require placement of NG tube at this time.  Plan:  Labs Pain medication Abdominal x-ray CT abdomen pelvis with contrast  ED Summary/Re-evaluation:  Patient required multiple IV pain medications in the emergency department.  CT scan did not show any acute findings.  Labs were also reassuring.  Patient repeatedly asking for Dilaudid and Phenergan.  There were some delays getting the Phenergan from pharmacy but patient declined Zofran and other antiemetics at this time.  Patient and her family member stating that Gina [name] is having persistent  pain.  I reviewed the CT scans myself which did not show any findings that would explain her symptoms.  Potentially could have torsing and detorsing left ovary causing her pain but that would not explain her other symptoms.  Will obtain ultrasound at this time to evaluate for this.  Given her repeated visits to the emergency department and the family stating that Gina [name] has had abdominal pain for months feel that this may be due to endometriosis or nonorganic causes of her ultrasound is normal.  Of note, patient became very upset when I told her that we are not going to give any additional IV pain medication at this time and refused oral pain medicine.  Signed out to Dr.  Sherry Ruffing awaiting ultrasound.  This patient presents to the ED for concern of complaints listed in HPI, this involves an extensive number of treatment options, and is a complaint that carries with it a high risk of complications and morbidity. Disposition including potential need for admission considered.   Dispo: Pending remainder of workup  Additional history obtained from family Records reviewed Outpatient Clinic Notes and ED Visit Notes The following labs were independently interpreted: Chemistry and show no acute abnormality I independently reviewed the following imaging with scope of interpretation limited to determining acute life threatening conditions related to emergency care: CT Abdomen/Pelvis and agree with the radiologist interpretation with the following exceptions: None I personally reviewed and interpreted cardiac monitoring: normal sinus rhythm  I personally reviewed and interpreted the pt's EKG: see above for interpretation  I have reviewed the patients home medications and made adjustments as needed  Final Clinical Impression(s) / ED Diagnoses Final diagnoses:  Left lower quadrant abdominal pain  Nausea and vomiting, unspecified vomiting type  Decreased frequency of bowel movements    Rx / DC Orders ED Discharge Orders     None         Fransico Meadow, MD 11/19/22 1541

## 2022-11-18 NOTE — ED Notes (Signed)
Patient transported to Ultrasound 

## 2022-11-22 ENCOUNTER — Other Ambulatory Visit: Payer: Self-pay | Admitting: Family

## 2022-11-22 DIAGNOSIS — R102 Pelvic and perineal pain: Secondary | ICD-10-CM

## 2022-12-11 ENCOUNTER — Ambulatory Visit
Admission: RE | Admit: 2022-12-11 | Discharge: 2022-12-11 | Disposition: A | Discharge status: Home / Self Care | Payer: Medicare Other | Source: Ambulatory Visit | Attending: Family | Admitting: Family

## 2022-12-11 DIAGNOSIS — R102 Pelvic and perineal pain: Secondary | ICD-10-CM

## 2022-12-11 MED ORDER — GADOPICLENOL 0.5 MMOL/ML IV SOLN
9.0000 mL | Freq: Once | INTRAVENOUS | Status: AC | PRN
  Administered 2022-12-11: 9 mL via INTRAVENOUS

## 2023-01-10 ENCOUNTER — Encounter (HOSPITAL_COMMUNITY): Payer: Self-pay | Admitting: Emergency Medicine

## 2023-01-10 ENCOUNTER — Emergency Department (HOSPITAL_COMMUNITY): Payer: Medicare Other

## 2023-01-10 ENCOUNTER — Emergency Department (HOSPITAL_COMMUNITY)
Admission: EM | Admit: 2023-01-10 | Discharge: 2023-01-10 | Disposition: A | Discharge status: Home / Self Care | Payer: Medicare Other | Attending: Emergency Medicine | Admitting: Emergency Medicine

## 2023-01-10 ENCOUNTER — Other Ambulatory Visit: Payer: Self-pay

## 2023-01-10 DIAGNOSIS — Z87891 Personal history of nicotine dependence: Secondary | ICD-10-CM | POA: Insufficient documentation

## 2023-01-10 DIAGNOSIS — R519 Headache, unspecified: Secondary | ICD-10-CM | POA: Insufficient documentation

## 2023-01-10 LAB — COMPREHENSIVE METABOLIC PANEL
ALT: 22 U/L (ref 0–44)
AST: 25 U/L (ref 15–41)
Albumin: 3.8 g/dL (ref 3.5–5.0)
Alkaline Phosphatase: 100 U/L (ref 38–126)
Anion gap: 10 (ref 5–15)
BUN: 11 mg/dL (ref 6–20)
CO2: 26 mmol/L (ref 22–32)
Calcium: 8.7 mg/dL — ABNORMAL LOW (ref 8.9–10.3)
Chloride: 102 mmol/L (ref 98–111)
Creatinine, Ser: 0.68 mg/dL (ref 0.44–1.00)
GFR, Estimated: >60 mL/min (ref >60)
Glucose, Bld: 88 mg/dL (ref 70–99)
Potassium: 4 mmol/L (ref 3.5–5.1)
Sodium: 138 mmol/L (ref 135–145)
Total Bilirubin: 0.5 mg/dL (ref 0.3–1.2)
Total Protein: 6.3 g/dL — ABNORMAL LOW (ref 6.5–8.1)

## 2023-01-10 LAB — CBC WITH DIFFERENTIAL/PLATELET
Abs Immature Granulocytes: 0.01 10*3/uL (ref 0.00–0.07)
Basophils Absolute: 0 10*3/uL (ref 0.0–0.1)
Basophils Relative: 1 %
Eosinophils Absolute: 0.2 10*3/uL (ref 0.0–0.5)
Eosinophils Relative: 2 %
HCT: 42.5 % (ref 36.0–46.0)
Hemoglobin: 14.1 g/dL (ref 12.0–15.0)
Immature Granulocytes: 0 %
Lymphocytes Relative: 31 %
Lymphs Abs: 2.1 10*3/uL (ref 0.7–4.0)
MCH: 32.1 pg (ref 26.0–34.0)
MCHC: 33.2 g/dL (ref 30.0–36.0)
MCV: 96.8 fL (ref 80.0–100.0)
Monocytes Absolute: 0.5 10*3/uL (ref 0.1–1.0)
Monocytes Relative: 7 %
Neutro Abs: 4 10*3/uL (ref 1.7–7.7)
Neutrophils Relative %: 59 %
Platelets: 268 10*3/uL (ref 150–400)
RBC: 4.39 MIL/uL (ref 3.87–5.11)
RDW: 11.9 % (ref 11.5–15.5)
WBC: 6.8 10*3/uL (ref 4.0–10.5)
nRBC: 0 % (ref 0.0–0.2)

## 2023-01-10 LAB — URINALYSIS, ROUTINE W REFLEX MICROSCOPIC
Bilirubin Urine: NEGATIVE
Glucose, UA: NEGATIVE mg/dL
Hgb urine dipstick: NEGATIVE
Ketones, ur: NEGATIVE mg/dL
Leukocytes,Ua: NEGATIVE
Nitrite: NEGATIVE
Protein, ur: NEGATIVE mg/dL
Specific Gravity, Urine: 1.006 (ref 1.005–1.030)
pH: 6 (ref 5.0–8.0)

## 2023-01-10 MED ORDER — HYDROCODONE-ACETAMINOPHEN 5-325 MG PO TABS: 2.0000 | ORAL_TABLET | ORAL | 0 refills | Status: DC | PRN

## 2023-01-10 MED ORDER — DIPHENHYDRAMINE HCL 50 MG/ML IJ SOLN
12.5000 mg | Freq: Once | INTRAMUSCULAR | Status: AC
  Administered 2023-01-10: 12.5 mg via INTRAVENOUS
  Filled 2023-01-10: qty 1

## 2023-01-10 MED ORDER — HYDROMORPHONE HCL 1 MG/ML IJ SOLN
0.5000 mg | Freq: Once | INTRAMUSCULAR | Status: AC
  Administered 2023-01-10: 0.5 mg via INTRAVENOUS
  Filled 2023-01-10: qty 1

## 2023-01-10 MED ORDER — LORAZEPAM 1 MG PO TABS
1.0000 mg | ORAL_TABLET | Freq: Once | ORAL | Status: AC
  Administered 2023-01-10: 1 mg via ORAL
  Filled 2023-01-10: qty 1

## 2023-01-10 MED ORDER — GADOBUTROL 1 MMOL/ML IV SOLN
8.5000 mL | Freq: Once | INTRAVENOUS | Status: AC | PRN
  Administered 2023-01-10: 8.5 mL via INTRAVENOUS

## 2023-01-10 MED ORDER — SODIUM CHLORIDE 0.9 % IV BOLUS
500.0000 mL | Freq: Once | INTRAVENOUS | Status: AC
  Administered 2023-01-10: 500 mL via INTRAVENOUS

## 2023-01-10 MED ORDER — METOCLOPRAMIDE HCL 5 MG/ML IJ SOLN
10.0000 mg | Freq: Once | INTRAMUSCULAR | Status: AC
  Administered 2023-01-10: 10 mg via INTRAVENOUS
  Filled 2023-01-10: qty 2

## 2023-01-10 MED ORDER — KETOROLAC TROMETHAMINE 15 MG/ML IJ SOLN
15.0000 mg | Freq: Once | INTRAMUSCULAR | Status: AC
  Administered 2023-01-10: 15 mg via INTRAVENOUS
  Filled 2023-01-10: qty 1

## 2023-01-10 NOTE — ED Provider Triage Note (Signed)
Emergency Medicine Provider Triage Evaluation Note  Gina [name] , a 47 y.o. female  was evaluated in triage.  Pt complains of headaches x 60-months, generally been ill for the past several months.  Went to ophthalmology today thinking she needed a new glasses prescription and the eyestrain might be causing her headaches, was told she needed to see a neurosurgeon without specific reason given.  Reports pain to the back of her head as well as left side of head, mild light sensitivity, not similar to her prior migraines.  No changes in speech or gait, no unilateral weakness or numbness.  States [birth year] birthday, does not feel well enough to participate and plans her husband has made.  Review of Systems  Positive:  Negative:   Physical Exam  BP 120/70   Pulse (!) 106   Temp 98.8 F (37.1 C) (Oral)   Resp 16   LMP 01/01/2004   SpO2 100%  Gen:   Awake, no distress   Resp:  Normal effort  MSK:   Moves extremities without difficulty  Other:  Speech clear, gait intact   Medical Decision Making  Medically screening exam initiated at 2:55 PM.  Appropriate orders placed.  Gina Boothewas informed that the remainder of the evaluation will be completed by another provider, this initial triage assessment does not replace that evaluation, and the importance of remaining in the ED until their evaluation is complete.     MTacy Learn PA-C 01/10/23 1456

## 2023-01-10 NOTE — ED Triage Notes (Signed)
Pt here  from [facility] doctor with c/o headaches, pt has been feeling bad  over the last few months pain in back of head , minor light sensitivity

## 2023-01-10 NOTE — ED Provider Notes (Signed)
[facility] Provider Note   CSN: [id number] Arrival date & time: 01/10/23  1402     History {Add pertinent medical, surgical, social history, OB history to HPI:1} Chief Complaint  Patient presents with   Headache    Gina [name] is a 47 y.o. female with a past medical history of anxiety, bipolar disorder, rheumatoid arthritis, UTI presents today for evaluation of headache.  Patient states she has had intermittent headache since November last year.  States the headaches got worse in the last 7 days with nausea and vomiting.  Pain is in the back and on the left side of her head.  She has tried Tylenol at home with no relief.  States she is sensitive to the light and sounds.  States she has migraine history in the past but this headache is not similar.  States she has gained 50 pounds in the last 5 months.  States she saw her eye doctor today for a new glass prescription who says she need to see a neurosurgeon in the ER without any specific reason.   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   Eating disorder    ENDOMETRIOSIS 12/16/2009   FATIGUE 06/11/2010   Fibromyalgia    GERD 10/23/2009   Heart palpitations    HERPES SIMPLEX INFECTION 06/11/2010   LENTIGO 04/23/2009   LUNG NODULE 06/17/2010   Narcotic abuse, continuous ([place])    Ovarian cyst    Palpitations 10/23/2009   Panic disorder    Renal disorder    Rheumatoid arthritis(714.0)    Stricture and stenosis of esophagus 05/13/2010   TOBACCO ABUSE 08/07/2008   Urinary tract infection    UTI  (urinary tract infection)    Past Surgical History:  Procedure Laterality Date   ABDOMINAL SURGERY     ABDOMINAL WALL MESH  REMOVAL     ABLATION ON ENDOMETRIOSIS     CESAREAN SECTION     x2    DILATION AND CURETTAGE OF UTERUS     x1    ENDOMETRIAL ABLATION     esophageal dilatation x 4     GUM SURGERY     HERNIA REPAIR     X3   PARTIAL HYSTERECTOMY       Home Medications Prior to Admission medications   Medication Sig Start Date End Date Taking? Authorizing Provider  HYDROcodone-acetaminophen (NORCO/VICODIN) 5-325 MG tablet Take 2 tablets by mouth every 4 (four) hours as needed. 01/10/23  Yes Rex Kras, PA  escitalopram (LEXAPRO) 20 MG tablet Take 30 mg by mouth at bedtime. 12/20/18   [provider]  HYDROcodone-acetaminophen (NORCO) 10-325 MG tablet Take 1 tablet by mouth every 6 (six) hours as needed for moderate pain. 09/14/22   [place] Eric J, DO  LORazepam (ATIVAN) 1 MG tablet Take 1 tablet (1 mg total) by mouth every 8 (eight) hours as needed for anxiety. 09/14/22 09/14/23  [place] Donnamarie Poag, DO  promethazine (PHENERGAN) 25 MG tablet Take 1 tablet (25 mg total) by mouth every 6 (six) hours as needed for nausea or vomiting. Patient  not taking: Reported on 11/18/2022 09/14/22   [place] Donnamarie Poag, DO      Allergies    Lamictal [lamotrigine], Morphine and related, Nicotine, Zofran, Chantix [varenicline tartrate], Codeine, Haldol [haloperidol], Ibuprofen, Lidoderm [lidocaine], Metaxalone, Sulfa antibiotics, Sulfasalazine, Toradol [ketorolac tromethamine], Duloxetine, Duloxetine hcl, Gabapentin, Ingrezza [valbenazine tosylate], Methadone, Pregabalin, Tramadol, Zolpidem, Zolpidem tartrate, Amoxicillin, Aripiprazole, Penicillins, and Valbenazine    Review of Systems   Review of Systems  Neurological:  Positive for headaches.    Physical Exam Updated Vital Signs BP 123/74 (BP Location: Right Arm)   Pulse 75   Temp 98.2 F (36.8 C) (Oral)   Resp 18   LMP 01/01/2004   SpO2  100%  Physical Exam Vitals and nursing note reviewed.  Constitutional:      Appearance: Normal appearance.  HENT:     Head: Normocephalic and atraumatic.     Mouth/Throat:     Mouth: Mucous membranes are moist.  Eyes:     General: No scleral icterus. Cardiovascular:     Rate and Rhythm: Normal rate and regular rhythm.     Pulses: Normal pulses.     Heart sounds: Normal heart sounds.  Pulmonary:     Effort: Pulmonary effort is normal.     Breath sounds: Normal breath sounds.  Abdominal:     General: Abdomen is flat.     Palpations: Abdomen is soft.     Tenderness: There is no abdominal tenderness.  Musculoskeletal:        General: No deformity.  Skin:    General: Skin is warm.     Findings: No rash.  Neurological:     General: No focal deficit present.     Mental Status: She is alert.     Comments: Cranial nerves II through XII intact. Intact sensation to light touch in all 4 extremities. 5/5 strength in all 4 extremities. Intact finger-to-nose and heel-to-shin of all 4 extremities. No visual field cuts. No neglect noted. No aphasia noted.   Psychiatric:        Mood and Affect: Mood normal.     ED Results / Procedures / Treatments   Labs (all labs ordered are listed, but only abnormal results are displayed) Labs Reviewed  COMPREHENSIVE METABOLIC PANEL - Abnormal; Notable for the following components:      Result Value   Calcium 8.7 (*)    Total Protein 6.3 (*)    All other components within normal limits  URINALYSIS, ROUTINE W REFLEX MICROSCOPIC - Abnormal; Notable for the following components:   Color, Urine STRAW (*)    All other components within normal limits  CBC WITH DIFFERENTIAL/PLATELET    EKG None  Radiology MR MRV HEAD W WO CONTRAST  Result Date: 01/10/2023 CLINICAL DATA:  Headache, sent by eye doctor EXAM: MR VENOGRAM HEAD WITHOUT AND WITH CONTRAST TECHNIQUE: Angiographic images of the intracranial venous structures were acquired using MRV technique  without and with intravenous contrast. CONTRAST:  8.31m GADAVIST GADOBUTROL 1 MMOL/ML IV SOLN COMPARISON:  No prior MRV available FINDINGS: There is no evidence of dural venous sinus or deep cerebral vein thrombosis. No dural venous sinus stenosis. No pathologic intracranial enhancement on whole-brain post-contrast imaging. IMPRESSION: No evidence of dural venous sinus or deep cerebral vein thrombosis. Electronically Signed   By: AMerilyn BabaM.D.   On: 01/10/2023 22:30   MR ANGIO HEAD WO CONTRAST  Result Date: 01/10/2023 CLINICAL DATA:  Headache, sent by Dr. EJasmine December MRA HEAD WITHOUT CONTRAST TECHNIQUE: Angiographic images of the Circle  of Willis were acquired using MRA technique without intravenous contrast. COMPARISON:  No prior MRA available FINDINGS: Anterior circulation: Both internal carotid arteries are patent to the termini, without significant stenosis. A1 segments patent. Normal anterior communicating artery. Anterior cerebral arteries are patent to their distal aspects. No M1 stenosis or occlusion. Normal MCA bifurcations. Distal MCA branches perfused and symmetric. Posterior circulation: Imaged vertebral arteries patent to the vertebrobasilar junction without stenosis. Basilar patent to its distal aspect. Superior cerebellar arteries patent bilaterally. Patent P1 segments. PCAs perfused to their distal aspects without stenosis. The right posterior communicating arteries patent. The left posterior communicating artery is not definitively seen. Anatomic variants: None significant IMPRESSION: No intracranial large vessel occlusion or significant stenosis. Electronically Signed   By: Merilyn Baba M.D.   On: 01/10/2023 22:25   CT Head Wo Contrast  Result Date: 01/10/2023 CLINICAL DATA:  Headache, increasing frequency or severity EXAM: CT HEAD WITHOUT CONTRAST TECHNIQUE: Contiguous axial images were obtained from the base of the skull through the vertex without intravenous contrast. RADIATION DOSE  REDUCTION: This exam was performed according to the departmental dose-optimization program which includes automated exposure control, adjustment of the mA and/or kV according to patient size and/or use of iterative reconstruction technique. COMPARISON:  None Available. FINDINGS: Brain: No acute intracranial hemorrhage. No focal mass lesion. No CT evidence of acute infarction. No midline shift or mass effect. No hydrocephalus. Basilar cisterns are patent. Vascular: No hyperdense vessel or unexpected calcification. Skull: Normal. Negative for fracture or focal lesion. Sinuses/Orbits: Paranasal sinuses and mastoid air cells are clear. Orbits are clear. Other: None. IMPRESSION: No acute intracranial findings. Electronically Signed   By: Suzy Bouchard M.D.   On: 01/10/2023 15:30    Procedures Procedures  {Document cardiac monitor, telemetry assessment procedure when appropriate:1}  Medications Ordered in ED Medications  HYDROmorphone (DILAUDID) injection 0.5 mg (has no administration in time range)  ketorolac (TORADOL) 15 MG/ML injection 15 mg (15 mg Intravenous Given 01/10/23 1848)  metoCLOPramide (REGLAN) injection 10 mg (10 mg Intravenous Given 01/10/23 1848)  diphenhydrAMINE (BENADRYL) injection 12.5 mg (12.5 mg Intravenous Given 01/10/23 1848)  sodium chloride 0.9 % bolus 500 mL (0 mLs Intravenous Stopped 01/10/23 1922)  LORazepam (ATIVAN) tablet 1 mg (1 mg Oral Given 01/10/23 1929)  gadobutrol (GADAVIST) 1 MMOL/ML injection 8.5 mL (8.5 mLs Intravenous Contrast Given 01/10/23 2209)    ED Course/ Medical Decision Making/ A&P   {   Click here for ABCD2, HEART and other calculatorsREFRESH Note before signing :1}                          Medical Decision Making Amount and/or Complexity of Data Reviewed Radiology: ordered.  Risk Prescription drug management.   This patient presents to the ED for headache, this involves an extensive number of treatment options, and is a complaint that carries  with a high risk of complications and morbidity.  The differential diagnosis includes cluster headache, migraine, sinusitis, tension headache, acute glaucoma, cervical artery dissection, CO poisoning, encephalitis, encephalopathy, meningitis, mass, pseudotumor, subarachnoid hemorrhage, temporal arteritis/giant cell arteritis, traumatic intracranial hemorrhage.  This is not an exhaustive list.  Lab tests: I ordered and personally interpreted labs.  The pertinent results include: WBC unremarkable. Hbg unremarkable. Platelets unremarkable. Electrolytes unremarkable. BUN, creatinine unremarkable.  UA unremarkable.  Imaging studies: I ordered imaging studies. I personally reviewed, interpreted imaging and agree with the radiologist's interpretations. The results include: CT head, MRI angio, MRV head negative.  Problem list/ ED course/ Critical interventions/ Medical management: HPI: See above Vital signs within normal range and stable throughout visit. Laboratory/imaging studies significant for: See above. On physical examination, patient is afebrile and appears in no acute distress. This patient presents with a headache most consistent with benign headache from either tension type headache vs migraine. No headache red flags. Neurologic exam without evidence of meningismus, AMS, focal neurologic findings so doubt meningitis, encephalitis, stroke. Presentation not consistent with acute intracranial bleed to include SAH (lack of risk factors, headache history, negative CT scan). No history of trauma so doubt ICH. Given history and physical temporal arteritis unlikely, as is acute angle closure glaucoma. Doubt carotid artery dissection given no focal neuro deficits, no neck trauma or recent neck strain. Patient with no signs of increased intracranial pressure or weight loss and history and physical suggest more benign headache so less likely mass effect in brain from tumor or abscess or idiopathic intracranial  hypertension. Pain was controlled with headache cocktail and Dilaudid and patient discharged home with PCP and neurology follow up. I have reviewed the patient home medicines and have made adjustments as needed.  Cardiac monitoring/EKG: The patient was maintained on a cardiac monitor.  I personally reviewed and interpreted the cardiac monitor which showed an underlying rhythm of: sinus rhythm.  Additional history obtained: External records from outside source obtained and reviewed including: Chart review including previous notes, labs, imaging.  Consultations obtained: I requested consultation with Dr. Leonel Ramsay neurology, and discussed lab and imaging findings as well as pertinent plan.  He recommended consult with ophthalmology, MRV.  Disposition Continued outpatient therapy. Follow-up with PCP and neurology recommended for reevaluation of symptoms. Treatment plan discussed with patient.  Pt acknowledged understanding was agreeable to the plan. Worrisome signs and symptoms were discussed with patient, and patient acknowledged understanding to return to the ED if they noticed these signs and symptoms. Patient was stable upon discharge.   This chart was dictated using voice recognition software.  Despite best efforts to proofread,  errors can occur which can change the documentation meaning.    {Document critical care time when appropriate:1} {Document review of labs and clinical decision tools ie heart score, Chads2Vasc2 etc:1}  {Document your independent review of radiology images, and any outside records:1} {Document your discussion with family members, caretakers, and with consultants:1} {Document social determinants of health affecting pt's care:1} {Document your decision making why or why not admission, treatments were needed:1} Final Clinical Impression(s) / ED Diagnoses Final diagnoses:  None    Rx / DC Orders ED Discharge Orders          Ordered     HYDROcodone-acetaminophen (NORCO/VICODIN) 5-325 MG tablet  Every 4 hours PRN        01/10/23 2326

## 2023-01-10 NOTE — ED Notes (Signed)
Pt reports headache that "does not go away with anything I take."  This has been constant for the past week.  No new orders at this time.

## 2023-01-10 NOTE — Discharge Instructions (Addendum)
Please take your medications as prescribed. Take tylenol/ibuprofen or for pain. I recommend close follow-up with neurology for reevaluation.  Please do not hesitate to return to emergency department if worrisome signs symptoms we discussed become apparent.

## 2023-01-18 ENCOUNTER — Ambulatory Visit: Payer: Medicare Other | Admitting: Neurology

## 2023-01-18 ENCOUNTER — Encounter: Payer: Self-pay | Admitting: Neurology

## 2023-01-18 ENCOUNTER — Telehealth: Payer: Self-pay | Admitting: Neurology

## 2023-01-18 VITALS — BP 124/81 | HR 101 | Ht 67.0 in | Wt 203.5 lb

## 2023-01-18 DIAGNOSIS — R519 Headache, unspecified: Secondary | ICD-10-CM

## 2023-01-18 DIAGNOSIS — H538 Other visual disturbances: Secondary | ICD-10-CM | POA: Diagnosis not present

## 2023-01-18 DIAGNOSIS — R635 Abnormal weight gain: Secondary | ICD-10-CM

## 2023-01-18 DIAGNOSIS — G43711 Chronic migraine without aura, intractable, with status migrainosus: Secondary | ICD-10-CM

## 2023-01-18 MED ORDER — DIAZEPAM 5 MG PO TABS: ORAL_TABLET | ORAL | 0 refills | Status: DC

## 2023-01-18 MED ORDER — RIZATRIPTAN BENZOATE 10 MG PO TBDP: 10.0000 mg | ORAL_TABLET | ORAL | 6 refills | Status: DC | PRN

## 2023-01-18 NOTE — Progress Notes (Signed)
Chief Complaint  Patient presents with   New Patient (Initial Visit)    Rm 15. Alone. NP Paper referral for ocular headaches/migraines / Francis Dowse [facility].      ASSESSMENT AND PLAN  Gina [name] is a 47 y.o. female   Long history of chronic migraine, Worsening persistent headache in setting of worsening mood disorder,  Does report rapid weight gain of 50 pounds,  MRI of the brain with without contrast to rule out structural abnormalities and assess on pituitary  Nerve block today failed to help her headache,  I have offered Toradol injection," never worked for me in the past", is taking oxycodone at home as needed  Maxalt 10 mg as needed  Return To Clinic With NP In 6 Months   DIAGNOSTIC DATA (LABS, IMAGING, TESTING) - I reviewed patient records, labs, notes, testing and imaging myself where available.   MEDICAL HISTORY:  Gina [name], is a 47 year old female, accompanied by her husband and daughter at today's clinical visit, seen in request by ophthalmologist Francis Dowse for evaluation of persistent headache, her primary care physician is Dr. Velna Hatchet, initial evaluation was on January 18, 2023  I reviewed and summarized the referring note. Bipolar  She had long history of bipolar disorder, was under reasonable control of Latuda, and Lexapro, hospital admission in November 2023 for persistent nausea, vomiting, abdominal pain, distention,  Imaging noticeable for partial small bowel obstruction, did have a history of previous hernia repair, cholecystectomy, multiple surgery for endometriosis, adhesion, she was treated with NG tube, bowel rest and decompression,  During that episode, she stopped her Latuda, also worried about the long-term side effect of tardive dyskinesia, reported already developed forceful muscle spasm of her hands, to the point of causing muscle strain forearm muscle pain,  Shortly afterwards, she began to have  frequent headaches, but it was intermittent, would come and goes, did have a history of migraine in the past, described typical migraine as lateralized severe pounding headache with light, noise sensitivity, nauseous  Since March 2024, she had 3 weeks persistent severe headaches, that would not go away, starting at left occipital area, spreading forward, mainly at the left retro-orbital area, constant pressure pain  Also complains of worsening depression, anxiety  She was started on Latuda again, which helped her mood disorder some, but her headache persisted  She has tried hydrocodone, oxycodone up to 20 mg each time without helping her headache  MRV on January 10 2023,  No evidence of dural venous sinus or deep cerebral vein thrombosis.  MRA brain wo, was normal.   Ophthalmology evaluation by Dr. Polo Riley on January 10, 2023, optic nerve clear bilaterally, ONH margin distinct, right eye hypermetropia,   She is most concerned about her 50 pounds weight gain over past 1 year, in the setting of frequent nausea, vomiting, lack of appetite, worry about pituitary abnormality     PHYSICAL EXAM:   Vitals:   01/18/23 1430  BP: 124/81  Pulse: (!) 101  Weight: 203 lb 8 oz (92.3 kg)  Height: 5\' 7"  (1.702 m)   Not recorded     Body mass index is 31.87 kg/m.  PHYSICAL EXAMNIATION:  Gen: NAD, conversant, well nourised, well groomed                     Cardiovascular: Regular rate rhythm, no peripheral edema, warm, nontender. Eyes: Conjunctivae clear without exudates or hemorrhage Neck: Supple, no carotid bruits. Pulmonary: Clear to  auscultation bilaterally   NEUROLOGICAL EXAM:  MENTAL STATUS: Speech/cognition: Depressed looking middle-age female, awake, alert, oriented to history taking and casual conversation CRANIAL NERVES: CN II: Visual fields are full to confrontation. Pupils are round equal and briskly reactive to light.  Funduscopy examination showed no significant  abnormality, CN III, IV, VI: extraocular movement are normal. No ptosis. CN V: Facial sensation is intact to light touch CN VII: Face is symmetric with normal eye closure  CN VIII: Hearing is normal to causal conversation. CN IX, X: Phonation is normal. CN XI: Head turning and shoulder shrug are intact  MOTOR: There is no pronator drift of out-stretched arms. Muscle bulk and tone are normal. Muscle strength is normal.  REFLEXES: Reflexes are 2+ and symmetric at the biceps, triceps, knees, and ankles. Plantar responses are flexor.  SENSORY: Intact to light touch, pinprick and vibratory sensation are intact in fingers and toes.  COORDINATION: There is no trunk or limb dysmetria noted.  GAIT/STANCE: Need push-up to get up from seated position, cautious  REVIEW OF SYSTEMS:  Full 14 system review of systems performed and notable only for as above All other review of systems were negative.   ALLERGIES: Allergies  Allergen Reactions   Lamictal [Lamotrigine] Anaphylaxis and Other (See Comments)    STEVEN JOHNSON'S SYNDROME   Morphine And Related Other (See Comments)    Makes pt "very mean" "I get mean" Makes pt "very mean" Makes pt "very mean"   Nicotine Swelling and Palpitations    Other reaction(s): Respiratory Distress (ALLERGY/intolerance), Swelling (ALLERGY/intolerance), Tachycardia / Palpitations  (intolerance) Patches, lozenges Patches caused palpations lozenges throat swelled   Zofran Anaphylaxis   Chantix [Varenicline Tartrate] Other (See Comments)     Diaphoresis, sweating.  Night terrors.  "went crazy"   Ciprofloxacin Diarrhea and Nausea And Vomiting   Codeine Nausea And Vomiting   Haldol [Haloperidol] Itching, [name] and Other (See Comments)    FLUSHING   Ibuprofen Nausea And Vomiting   Lidoderm [Lidocaine] Other (See Comments)    DOESN'T WORK FOR PATIENT   Metaxalone Other (See Comments)    Pt does not remember reaction, REAL BAD REACTION   Sulfa Antibiotics  Diarrhea, Other (See Comments) and Nausea And Vomiting    GI PAINS ALSO GI PAINS ALSO    Sulfasalazine Diarrhea    GI PAINS   Toradol [Ketorolac Tromethamine] Nausea And Vomiting   Duloxetine Other (See Comments)    unk   Duloxetine Hcl Other (See Comments)   Gabapentin Nausea And Vomiting   Ingrezza [Valbenazine Tosylate] Hives   Methadone     seizures   Pregabalin     unk   Tramadol Other (See Comments)   Zolpidem     Mental status changes   Zolpidem Tartrate Other (See Comments)   Amoxicillin [name]    Has patient had a PCN reaction causing immediate [name], facial/tongue/throat swelling, SOB or lightheadedness with hypotension: No Has patient had a PCN reaction causing severe [name] involving mucus membranes or skin necrosis: No Has patient had a PCN reaction that required hospitalization: No Has patient had a PCN reaction occurring within the last 10 years: No If all of the above answers are "NO", then may proceed with Cephalosporin use.    Aripiprazole Anxiety    hallucinations   Penicillins [name]    Other reaction(s): Urticaria / Hives (ALLERGY) Has patient had a PCN reaction causing immediate [name], facial/tongue/throat swelling, SOB or lightheadedness with hypotension: No Has patient had a PCN reaction causing severe  [name] involving mucus membranes or skin necrosis: No Has patient had a PCN reaction that required hospitalization: No Has patient had a PCN reaction occurring within the last 10 years: No If all of the above answers are "NO", then may proceed with Cephalosporin use.   Valbenazine [name]    HOME MEDICATIONS: Current Outpatient Medications  Medication Sig Dispense Refill   escitalopram (LEXAPRO) 20 MG tablet Take 30 mg by mouth at bedtime.     LORazepam (ATIVAN) 1 MG tablet Take 1 tablet (1 mg total) by mouth every 8 (eight) hours as needed for anxiety. 20 tablet 0   Lurasidone HCl 60 MG TABS Take 60 mg by mouth daily.     oxyCODONE-acetaminophen (PERCOCET)  7.5-325 MG tablet Take 1 tablet by mouth every 6 (six) hours.     promethazine (PHENERGAN) 25 MG tablet Take 1 tablet (25 mg total) by mouth every 6 (six) hours as needed for nausea or vomiting. 20 tablet 0   No current facility-administered medications for this visit.    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   Eating disorder    ENDOMETRIOSIS 12/16/2009   FATIGUE 06/11/2010   Fibromyalgia    GERD 10/23/2009   Heart palpitations    HERPES SIMPLEX INFECTION 06/11/2010   LENTIGO 04/23/2009   LUNG NODULE 06/17/2010   Narcotic abuse, continuous ([place])    Ovarian cyst    Palpitations 10/23/2009   Panic disorder    Renal disorder    Rheumatoid arthritis(714.0)    Stricture and stenosis of esophagus 05/13/2010   TOBACCO ABUSE 08/07/2008   Urinary tract infection    UTI (urinary tract infection)     PAST SURGICAL HISTORY: Past Surgical History:  Procedure Laterality Date   ABDOMINAL SURGERY     ABDOMINAL WALL MESH  REMOVAL     ABLATION ON ENDOMETRIOSIS     CESAREAN SECTION     x2    DILATION AND CURETTAGE OF UTERUS     x1    ENDOMETRIAL ABLATION     esophageal dilatation x 4     GUM SURGERY     HERNIA REPAIR     X3   PARTIAL HYSTERECTOMY      FAMILY HISTORY: Family History  Problem Relation Age of Onset   Depression Mother    Arthritis Mother    Hyperlipidemia Father    Hypertension Father     SOCIAL HISTORY: Social History   Socioeconomic History   Marital status: Married    Spouse name: Not on file   Number of children: Not on file   Years of education: Not on file    Highest education level: Not on file  Occupational History   Not on file  Tobacco Use   Smoking status: Every Day    Packs/day: .2    Types: Cigarettes   Smokeless tobacco: Current  Vaping Use   Vaping Use: Every day  Substance and Sexual Activity   Alcohol use: No   Drug use: No   Sexual activity: Yes    Birth control/protection: Surgical  Other Topics Concern  Not on file  Social History Narrative   Not on file   Social Determinants of Health   Financial Resource Strain: Not on file  Food Insecurity: No Food Insecurity (09/11/2022)   Hunger Vital Sign    Worried About Running Out of Food in the Last Year: Never true    Ran Out of Food in the Last Year: Never true  Transportation Needs: No Transportation Needs (09/11/2022)   PRAPARE - Hydrologist (Medical): No    Lack of Transportation (Non-Medical): No  Physical Activity: Not on file  Stress: Not on file  Social Connections: Not on file  Intimate Partner Violence: Not At Risk (09/11/2022)   Humiliation, Afraid, Rape, and Kick questionnaire    Fear of Current or Ex-Partner: No    Emotionally Abused: No    Physically Abused: No    Sexually Abused: No      Marcial Pacas, M.D. Ph.D.  [facility] [address], [place], [place] [address] Ph: [phone] Fax: [phone]  CC:  [address] [place],  [place] [address]  Velna Hatchet, MD

## 2023-01-18 NOTE — Progress Notes (Signed)
   History: Persistent moderate to severe headache for 3 weeks, failed home remedy    Bilateral occipital and trigger point injection of bilateral cervical and upper trapezius muscles for intractable headache  Bupivacaine 0.5% was injected on the scalp at several locations:  -On the bilateral occipital area of the head, 3 injections each side, 0.5 cc per injection at the midpoint between the mastoid process and the occipital protuberance. 2 other injections were done one finger breadth from the initial injection, one at a 10 o'clock position and the other at a 2 o'clock position.  -2 injections of 0.5 cc were done in the left temporal regions, 2 fingerbreadths above the tragus of the ear, with the second injection one fingerbreadth posteriorly to the first.   -0.5 cc was injected into the left upper trapezius and left levator scapula and upper cervical paraspinals  --Injection along left nuchal line with mixture of 2 cc of 0.5% bupivacaine with 6 mg of betamethasone   The patient tolerated the injections well, no complications of the procedure were noted. Injections were made with a 27-gauge needle.

## 2023-01-18 NOTE — Telephone Encounter (Signed)
 medicaid/[employer] medicare NPR sent to GI [phone]

## 2023-01-23 ENCOUNTER — Ambulatory Visit
Admission: RE | Admit: 2023-01-23 | Discharge: 2023-01-23 | Disposition: A | Discharge status: Home / Self Care | Payer: Medicare Other | Source: Ambulatory Visit | Attending: Neurology | Admitting: Neurology

## 2023-01-23 DIAGNOSIS — H538 Other visual disturbances: Secondary | ICD-10-CM

## 2023-01-23 DIAGNOSIS — R519 Headache, unspecified: Secondary | ICD-10-CM

## 2023-01-23 DIAGNOSIS — R635 Abnormal weight gain: Secondary | ICD-10-CM

## 2023-01-23 DIAGNOSIS — G43711 Chronic migraine without aura, intractable, with status migrainosus: Secondary | ICD-10-CM

## 2023-01-23 MED ORDER — GADOPICLENOL 0.5 MMOL/ML IV SOLN
10.0000 mL | Freq: Once | INTRAVENOUS | Status: AC | PRN
  Administered 2023-01-23: 10 mL via INTRAVENOUS

## 2023-01-25 ENCOUNTER — Telehealth: Payer: Self-pay | Admitting: Neurology

## 2023-01-25 MED ORDER — AZITHROMYCIN 250 MG PO TABS: ORAL_TABLET | ORAL | 0 refills | Status: DC

## 2023-01-25 NOTE — Telephone Encounter (Signed)
Patient left a voicemail to check if 5 day z pack has been sent in for her. She is also asking for something for a sore throat. FYI

## 2023-01-25 NOTE — Telephone Encounter (Signed)
-----   Message from Britt Bottom, MD sent at 01/25/2023 12:44 PM EDT ----- Please let her know that the MRI MRI of the brain was normal.  There was some fluid in the 1 sinus.  Asked her if she has any sinusitis type symptoms.  If she does we can send in a 5-day Z-Pak

## 2023-01-25 NOTE — Telephone Encounter (Signed)
Schedule her a follow up with an NP, first available to discuss changing medications

## 2023-01-25 NOTE — Telephone Encounter (Signed)
Called the patient and reviewed the MRI results. Advised the patient that the MRI of brain was normal with exception of sinus fluid collection. Advised the patient that if she is having symptoms related to sinus infection, Dr Felecia Shelling would be willing to call in a Zpak. Pt does have symptoms and would be appreciative. Pt asked what can be done for headaches. She was given rizatriptan to take as abortive therapy. Pt had a reaction with that medication and states she doesn't want to use that. Pt is not on a preventative medication but would be ok with trying something.  Dr Krista Blue is out of the office this week and next week. Will have to see if the work in MD feels comfortable ordering in her absence. Pt verbalized understanding.

## 2023-01-25 NOTE — Telephone Encounter (Signed)
The z pack has been called in for the patient. As for a sore throat there is not much that we can order. She can try otc remedies or gargle in warm salt and water. Alternatively may need to see her PCP. The work in MD, would like the pt to schedule an appointment to discuss next steps for treatment. Dr Krista Blue is booked but I can discuss with Dr Krista Blue, upon her return on what she recommends.

## 2023-01-25 NOTE — Telephone Encounter (Signed)
Called patient and advised that her zpack has be sent in, she let me know that she has already picked it up. She asked for recommendations for a sore throat, and I let her know that there is not much that we can order. I let her know that over the counter remedies were recommended or gargle with warm water and salt, and to reach out to her PCP if neither option offers any relief. She let me know that she has an appointment with her PCP tomorrow.  I also let her know that we discussed her headaches with the work in MD and they recommended that we discuss this with Dr Krista Blue upon her return. I let her know that Dr Krista Blue will be back in the office next Wednesday, 4/3 and that someone will reach out after discussing this.  She expressed appreciation and call was ended.

## 2023-01-28 ENCOUNTER — Ambulatory Visit: Payer: Medicaid Other | Admitting: Neurology

## 2023-02-02 NOTE — Telephone Encounter (Signed)
Informed pt of message nurse Katie sent,  Please advise patient to see PCP or ENT, Pt said she has followed up with PCP and no one figure out what's going on with  her,

## 2023-02-02 NOTE — Telephone Encounter (Signed)
Agree above, she should contact her primary care physician or ENT for treatment of her throat and sinus concern,  If she continues to have headaches, may consider headache clinic refer

## 2023-03-07 ENCOUNTER — Other Ambulatory Visit: Payer: Self-pay

## 2023-03-07 ENCOUNTER — Encounter (HOSPITAL_BASED_OUTPATIENT_CLINIC_OR_DEPARTMENT_OTHER): Payer: Self-pay | Admitting: Emergency Medicine

## 2023-03-07 ENCOUNTER — Emergency Department (HOSPITAL_BASED_OUTPATIENT_CLINIC_OR_DEPARTMENT_OTHER)
Admission: EM | Admit: 2023-03-07 | Discharge: 2023-03-07 | Disposition: A | Discharge status: Home / Self Care | Payer: Medicare Other | Attending: Emergency Medicine | Admitting: Emergency Medicine

## 2023-03-07 DIAGNOSIS — G8918 Other acute postprocedural pain: Secondary | ICD-10-CM | POA: Diagnosis present

## 2023-03-07 NOTE — ED Notes (Signed)
Pt verbalized understanding of d/c instructions, meds, and followup care. Denies questions. VSS, no distress noted. Steady gait to exit with all belongings.  ?

## 2023-03-07 NOTE — ED Provider Notes (Signed)
[facility] EMERGENC[facility] DEPART[facility] AT [facility] Provider Note   CSN: [id number] Arrival date & time: 03/07/23  2037     History  Chief Complaint  Patient presents with   Post-op Problem    Gina [name] is a 47 y.o. female.  HPI   Patient had surgery on March 01, 2023.  Patient states she had a laparoscopic surgery as well as a biopsy.  Patient states she has been having some abdominal discomfort since the surgery but she does not think that is any more than to be expected.  She did have an area where skin biopsy was performed.  Patient states she was having pain and irritation in that area.  Because of the location she was not able to see it.  Her husband looked at it and saw hold and there was some discharge from the area.  They were concerned so they came to the ED.  Home Medications Prior to Admission medications   Medication Sig Start Date End Date Taking? Authorizing Provider  azithromycin (ZITHROMAX Z-PAK) 250 MG tablet Take as directed 01/25/23   Sater, Pearletha Furl, MD  diazepam (VALIUM) 5 MG tablet Take 1 tab at onset of migraine.  May repeat in 2 hrs, if needed.  Max dose: 2 tabs/day. This is a 30 day prescription. 01/18/23   Levert Feinstein, MD  escitalopram (LEXAPRO) 20 MG tablet Take 30 mg by mouth at bedtime. 12/20/18   [provider]  LORazepam (ATIVAN) 1 MG tablet Take 1 tablet (1 mg total) by mouth every 8 (eight) hours as needed for anxiety. 09/14/22 09/14/23  [place], Alvira Philips, DO  Lurasidone HCl 60 MG TABS Take 60 mg by mouth daily. 12/16/22   [provider]  oxyCODONE-acetaminophen (PERCOCET) 7.5-325 MG tablet Take 1 tablet by mouth every 6 (six) hours. 12/02/22   [provider]  promethazine (PHENERGAN) 25 MG tablet Take 1 tablet (25 mg total) by mouth every 6 (six) hours as needed for nausea or vomiting. 09/14/22   [place], Alvira Philips, DO  rizatriptan (MAXALT-MLT) 10 MG disintegrating tablet Take 1 tablet (10 mg total) by mouth as needed.  May repeat in 2 hours if needed 01/18/23   Levert Feinstein, MD      Allergies    Lamictal [lamotrigine], Morphine and related, Nicotine, Zofran, Chantix [varenicline tartrate], Ciprofloxacin, Codeine, Haldol [haloperidol], Ibuprofen, Lidoderm [lidocaine], Metaxalone, Sulfa antibiotics, Sulfasalazine, Toradol [ketorolac tromethamine], Duloxetine, Duloxetine hcl, Gabapentin, Ingrezza [valbenazine tosylate], Methadone, Pregabalin, Zolpidem, Zolpidem tartrate, Amoxicillin, Aripiprazole, Penicillins, Tramadol, and Valbenazine    Review of Systems   Review of Systems  Physical Exam Updated Vital Signs BP 114/84   Pulse 97   Temp 100.3 F (37.9 C) (Oral)   Resp 18   LMP 01/01/2004   SpO2 98%  Physical Exam Vitals and nursing note reviewed.  Constitutional:      General: She is not in acute distress.    Appearance: She is well-developed.  HENT:     Head: Normocephalic and atraumatic.     Right Ear: External ear normal.     Left Ear: External ear normal.  Eyes:     General: No scleral icterus.       Right eye: No discharge.        Left eye: No discharge.     Conjunctiva/sclera: Conjunctivae normal.  Neck:     Trachea: No tracheal deviation.  Cardiovascular:     Rate and Rhythm: Normal rate.  Pulmonary:     Effort: Pulmonary effort  is normal. No respiratory distress.     Breath sounds: No stridor.  Abdominal:     General: There is no distension.     Palpations: There is no mass.     Tenderness: There is no guarding.  Genitourinary:    Comments: Small circular well circumscribed area on the labia that appears to be a biopsy area, there is some fibrinous type exudate at the base of the wound but no signs of purulent drainage Musculoskeletal:        General: No swelling or deformity.     Cervical back: Neck supple.  Skin:    General: Skin is warm and dry.     Findings: No rash.  Neurological:     Mental Status: She is alert. Mental status is at baseline.     Cranial Nerves: No  dysarthria or facial asymmetry.     Motor: No seizure activity.     ED Results / Procedures / Treatments   Labs (all labs ordered are listed, but only abnormal results are displayed) Labs Reviewed - No data to display  EKG None  Radiology No results found.  Procedures Procedures    Medications Ordered in ED Medications - No data to display  ED Course/ Medical Decision Making/ A&P                             Medical Decision Making  Patient denies any specific known pain or discomfort in her abdomen.  She feels like that is consistent with her recent surgery.  Patient was primarily concerned about the skin lesion.  On exam there does not appear to be any evidence of infection.  Does appear to be an area of an excisional biopsy.  There are some fibrinous dark material at the base of the wound but this looks more like blood and fibrinous exudate.  It does not appear to be infectious.  Will have her placed antibiotic ointment and a pad to avoid irritation to the wound.  Recommend outpatient follow-up with her GYN.        Final Clinical Impression(s) / ED Diagnoses Final diagnoses:  Post-operative pain    Rx / DC Orders ED Discharge Orders     None         Linwood Dibbles, MD 03/07/23 2202

## 2023-03-07 NOTE — Discharge Instructions (Signed)
You can apply antibiotic ointment to the skin biopsy area.  Keep a pad over the wound to help avoid irritation.  Follow-up with your OB/GYN doctor as planned

## 2023-03-07 NOTE — ED Triage Notes (Signed)
Gyn surgery on 03/01/2023. Reports pain, odor and discharge from area.  Notice today.

## 2023-03-11 ENCOUNTER — Telehealth: Payer: Self-pay

## 2023-03-11 NOTE — Telephone Encounter (Signed)
Transition Care Management Unsuccessful Follow-up Telephone Call  Date of discharge and from where:  03/07/2023 [facility]  Attempts:  1st Attempt  Reason for unsuccessful TCM follow-up call:  Left voice message  Ardella Chhim [facility]  [facility] Population Health Community Resource Care Guide   [email or web address]  ?? [phone]   Website: [email or web address]  [email or web address]

## 2023-03-16 ENCOUNTER — Telehealth: Payer: Self-pay

## 2023-03-16 NOTE — Telephone Encounter (Signed)
Transition Care Management Unsuccessful Follow-up Telephone Call  Date of discharge and from where:  03/07/2023 [facility]  Attempts:  2nd Attempt  Reason for unsuccessful TCM follow-up call:  Left voice message  Krisy Dix [facility]  [facility] Population Health Community Resource Care Guide   [email or web address]  ?? [phone]   Website: [email or web address]  [email or web address]

## 2023-04-14 ENCOUNTER — Other Ambulatory Visit: Payer: Self-pay

## 2023-04-14 ENCOUNTER — Encounter (HOSPITAL_BASED_OUTPATIENT_CLINIC_OR_DEPARTMENT_OTHER): Payer: Self-pay

## 2023-04-14 ENCOUNTER — Emergency Department (HOSPITAL_BASED_OUTPATIENT_CLINIC_OR_DEPARTMENT_OTHER)
Admission: EM | Admit: 2023-04-14 | Discharge: 2023-04-14 | Disposition: A | Discharge status: Home / Self Care | Payer: Medicare Other | Attending: Emergency Medicine | Admitting: Emergency Medicine

## 2023-04-14 DIAGNOSIS — M797 Fibromyalgia: Secondary | ICD-10-CM | POA: Insufficient documentation

## 2023-04-14 DIAGNOSIS — M791 Myalgia, unspecified site: Secondary | ICD-10-CM | POA: Diagnosis present

## 2023-04-14 LAB — BASIC METABOLIC PANEL
Anion gap: 6 (ref 5–15)
BUN: 13 mg/dL (ref 6–20)
CO2: 28 mmol/L (ref 22–32)
Calcium: 8.8 mg/dL — ABNORMAL LOW (ref 8.9–10.3)
Chloride: 104 mmol/L (ref 98–111)
Creatinine, Ser: 0.65 mg/dL (ref 0.44–1.00)
GFR, Estimated: >60 mL/min (ref >60)
Glucose, Bld: 90 mg/dL (ref 70–99)
Potassium: 4 mmol/L (ref 3.5–5.1)
Sodium: 138 mmol/L (ref 135–145)

## 2023-04-14 LAB — CBC
HCT: 41.8 % (ref 36.0–46.0)
Hemoglobin: 14.2 g/dL (ref 12.0–15.0)
MCH: 31.7 pg (ref 26.0–34.0)
MCHC: 34 g/dL (ref 30.0–36.0)
MCV: 93.3 fL (ref 80.0–100.0)
Platelets: 238 10*3/uL (ref 150–400)
RBC: 4.48 MIL/uL (ref 3.87–5.11)
RDW: 12.1 % (ref 11.5–15.5)
WBC: 7 10*3/uL (ref 4.0–10.5)
nRBC: 0 % (ref 0.0–0.2)

## 2023-04-14 LAB — MAGNESIUM: Magnesium: 2.1 mg/dL (ref 1.7–2.4)

## 2023-04-14 MED ORDER — SODIUM CHLORIDE 0.9 % IV SOLN
12.5000 mg | Freq: Four times a day (QID) | INTRAVENOUS | Status: DC | PRN
  Administered 2023-04-14: 12.5 mg via INTRAVENOUS
  Filled 2023-04-14: qty 0.5

## 2023-04-14 MED ORDER — MIDAZOLAM HCL 2 MG/2ML IJ SOLN
2.0000 mg | Freq: Once | INTRAMUSCULAR | Status: AC
  Administered 2023-04-14: 2 mg via INTRAVENOUS
  Filled 2023-04-14: qty 2

## 2023-04-14 MED ORDER — SODIUM CHLORIDE 0.9 % IV BOLUS
1000.0000 mL | Freq: Once | INTRAVENOUS | Status: AC
  Administered 2023-04-14: 1000 mL via INTRAVENOUS

## 2023-04-14 MED ORDER — HYDROMORPHONE HCL 1 MG/ML IJ SOLN
1.0000 mg | Freq: Once | INTRAMUSCULAR | Status: AC
  Administered 2023-04-14: 1 mg via INTRAVENOUS
  Filled 2023-04-14: qty 1

## 2023-04-14 MED ORDER — DEXAMETHASONE SODIUM PHOSPHATE 10 MG/ML IJ SOLN
8.0000 mg | Freq: Once | INTRAMUSCULAR | Status: AC
  Administered 2023-04-14: 8 mg via INTRAVENOUS
  Filled 2023-04-14: qty 1

## 2023-04-14 MED ORDER — PROMETHAZINE HCL 25 MG PO TABS: 25.0000 mg | ORAL_TABLET | Freq: Four times a day (QID) | ORAL | 0 refills | Status: DC | PRN

## 2023-04-14 MED ORDER — PROMETHAZINE HCL 25 MG/ML IJ SOLN
INTRAMUSCULAR | Status: AC
  Filled 2023-04-14: qty 1

## 2023-04-14 NOTE — ED Provider Notes (Signed)
EMERGENCY DEPARTMENT AT [facility] Provider Note   CSN: [id number] Arrival date & time: 04/14/23  1914     History  Chief Complaint  Patient presents with   Muscle Pain    Gina [name] is a 47 y.o. female presenting Emergency Department planing of diffuse body pain for 2 days.  The patient reports that she suffers with fibromyalgia and this feels very similar to fibromyalgia flare up.  She describes aching pain in her chest wall, as well as her joints in her right wrist, worse with any type of movement.  She reports that she to begin Ozempic as a new medication 3 days ago, 1 day before symptom onset.  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  She tried taking her home Flexeril, Norco, heating packs and using her TENS unit, with no relief.  She says often she is needing IV medications when she reaches this point.  HPI     Home Medications Prior to Admission medications   Medication Sig Start Date End Date Taking? Authorizing Provider  azithromycin (ZITHROMAX Z-PAK) 250 MG tablet Take as directed 01/25/23   Sater, Pearletha Furl, MD  diazepam (VALIUM) 5 MG tablet Take 1 tab at onset of migraine.  May repeat in 2 hrs, if needed.  Max dose: 2 tabs/day. This is a 30 day prescription. 01/18/23   Levert Feinstein, MD  escitalopram (LEXAPRO) 20 MG tablet Take 30 mg by mouth at bedtime. 12/20/18   [provider]  LORazepam (ATIVAN) 1 MG tablet Take 1 tablet (1 mg total) by mouth every 8 (eight) hours as needed for anxiety. 09/14/22 09/14/23  [place], Alvira Philips, DO  Lurasidone HCl 60 MG TABS Take 60 mg by mouth daily. 12/16/22   [provider]  oxyCODONE-acetaminophen (PERCOCET) 7.5-325 MG tablet Take 1 tablet by mouth every 6 (six) hours. 12/02/22   [provider]  promethazine (PHENERGAN) 25 MG tablet Take 1 tablet (25 mg total) by mouth every 6 (six)  hours as needed for up to 15 doses for nausea or vomiting. 04/14/23   Terald Sleeper, MD  rizatriptan (MAXALT-MLT) 10 MG disintegrating tablet Take 1 tablet (10 mg total) by mouth as needed. May repeat in 2 hours if needed 01/18/23   Levert Feinstein, MD      Allergies    Lamictal [lamotrigine], Morphine and codeine, Nicotine, Zofran, Chantix [varenicline tartrate], Ciprofloxacin, Codeine, Haldol [haloperidol], Ibuprofen, Lidoderm [lidocaine], Metaxalone, Sulfa antibiotics, Sulfasalazine, Toradol [ketorolac tromethamine], Duloxetine, Duloxetine hcl, Gabapentin, Ingrezza [valbenazine tosylate], Methadone, Pregabalin, Zolpidem, Zolpidem tartrate, Amoxicillin, Aripiprazole, Penicillins, Tramadol, and Valbenazine    Review of Systems   Review of Systems  Physical Exam Updated Vital Signs BP 121/74 (BP Location: Right Arm)   Pulse 84   Temp 98.3 F (36.8 C) (Oral)   Resp 20   Ht 5\' 7"  (1.702 m)   Wt 99.8 kg   LMP 01/01/2004   SpO2 100%   BMI 34.46 kg/m  Physical Exam Constitutional:      General: She is not in acute distress. HENT:     Head: Normocephalic and atraumatic.  Eyes:     Conjunctiva/sclera: Conjunctivae normal.     Pupils: Pupils are equal, round, and reactive to light.  Cardiovascular:     Rate and Rhythm: Normal rate and regular rhythm.  Pulmonary:     Effort: Pulmonary effort is normal. No respiratory  distress.  Skin:    General: Skin is warm and dry.  Neurological:     General: No focal deficit present.     Mental Status: She is alert. Mental status is at baseline.  Psychiatric:        Mood and Affect: Mood normal.        Behavior: Behavior normal.     ED Results / Procedures / Treatments   Labs (all labs ordered are listed, but only abnormal results are displayed) Labs Reviewed  BASIC METABOLIC PANEL - Abnormal; Notable for the following components:      Result Value   Calcium 8.8 (*)    All other components within normal limits  MAGNESIUM  CBC     EKG None  Radiology No results found.  Procedures Procedures    Medications Ordered in ED Medications  promethazine (PHENERGAN) 12.5 mg in sodium chloride 0.9 % 50 mL IVPB (0 mg Intravenous Stopped 04/14/23 1143)  promethazine (PHENERGAN) 25 MG/ML injection (  Not Given 04/14/23 1107)  dexamethasone (DECADRON) injection 8 mg (8 mg Intravenous Given 04/14/23 1022)  HYDROmorphone (DILAUDID) injection 1 mg (1 mg Intravenous Given 04/14/23 1021)  midazolam (VERSED) injection 2 mg (2 mg Intravenous Given 04/14/23 1022)  sodium chloride 0.9 % bolus 1,000 mL (0 mLs Intravenous Stopped 04/14/23 1202)  HYDROmorphone (DILAUDID) injection 1 mg (1 mg Intravenous Given 04/14/23 1204)  HYDROmorphone (DILAUDID) injection 1 mg (1 mg Intravenous Given 04/14/23 1249)    ED Course/ Medical Decision Making/ A&P Clinical Course as of 04/14/23 1522  Thu Apr 14, 2023  1226 Patient's pain is improved, she is requesting additional dose of IV pain medication prior to discharge but otherwise feels that she has improved enough to go home.  She also requested refill of her Phenergan oral medication which he had run out of.  Her daughter will be taking her home.  Okay for discharge [MT]    Clinical Course User Index [MT] Jaamal Farooqui, Kermit Balo, MD                             Medical Decision Making Amount and/or Complexity of Data Reviewed Labs: ordered.  Risk Prescription drug management.   This patient presents to the ED with concern for diffuse body pains and myalgias. This involves an extensive number of treatment options, and is a complaint that carries with it a high risk of complications and morbidity.  The differential diagnosis includes a flareup most likely, given the patient's prior history and her description of exactly similar symptoms.  Less likely as this acute intrathoracic process, electrolyte derangement, rhabdomyolysis based on this presentation.  There is no indication for emergent  intrathoracic or abdominal imaging at this time.  Low suspicion for sepsis or blood infection.  Co-morbidities that complicate the patient evaluation: Fibromyalgia, autoimmune disorder, at risk of exacerbation  External records from outside source obtained and reviewed including PDMP review - patient prescribed norco 5 mg and ativan 1 mg tablets  I ordered and personally interpreted labs.  The pertinent results include: No emergent findings  I ordered medication including IV steroids, IV fluids, IV Dilaudid and IV Versed for suspected autoimmune exacerbation, myalgias  I have reviewed the patients home medicines and have made adjustments as needed  Test Considered: Low suspicion for acute PE, pneumonia, intra-abdominal infection or emergency.  No indication for CT imaging at this time.  After the interventions noted above, I reevaluated the patient and  found that they have: improved   Dispostion:  After consideration of the diagnostic results and the patients response to treatment, I feel that the patent would benefit from outpatient PCP follow-up         Final Clinical Impression(s) / ED Diagnoses Final diagnoses:  Fibromyalgia    Rx / DC Orders ED Discharge Orders          Ordered    promethazine (PHENERGAN) 25 MG tablet  Every 6 hours PRN        04/14/23 1226              Terald Sleeper, MD 04/14/23 [phone]

## 2023-04-14 NOTE — ED Triage Notes (Signed)
Pt POV from home c/o upper body pains, neck shoulders, both arms. Described as "locked down". Pt states hx of fibromyalgia, is concerned she exerted herself too hard the last few days. NAD.

## 2023-04-20 ENCOUNTER — Telehealth: Payer: Self-pay | Admitting: *Deleted

## 2023-04-20 NOTE — Telephone Encounter (Signed)
Transition Care Management Follow-up Telephone Call Date of discharge and from where: [facility] 6/13 How have you been since you were released from the hospital? Not feeling well has an appt in a few with a specialist  Any questions or concerns? No  Items Reviewed: Did the pt receive and understand the discharge instructions provided? Yes  Medications obtained and verified? No  Other? Yes  Any new allergies since your discharge? No  Dietary orders reviewed? No Do you have support at home? Yes      Follow up appointments reviewed:  PCP Hospital f/u appt confirmed? No  Scheduled Specialist Hospital f/u appt confirmed? No  Patient says she has a appt with a specialist in a few weeks Are transportation arrangements needed? No  If their condition worsens, is the pt aware to call PCP or go to the Emergency Dept.? Yes Was the patient provided with contact information for the PCP's office or ED? Yes Was to pt encouraged to call back with questions or concerns? Yes     Alois Cliche -Berneda Rose [employer] [place], Population [facility] [phone] [address] [place] , [place] [address] Email : [email or web address]

## 2023-04-22 ENCOUNTER — Encounter (HOSPITAL_BASED_OUTPATIENT_CLINIC_OR_DEPARTMENT_OTHER): Payer: Self-pay | Admitting: Emergency Medicine

## 2023-04-22 ENCOUNTER — Emergency Department (HOSPITAL_BASED_OUTPATIENT_CLINIC_OR_DEPARTMENT_OTHER)
Admission: EM | Admit: 2023-04-22 | Discharge: 2023-04-22 | Disposition: A | Discharge status: Home / Self Care | Payer: Medicare Other | Attending: Emergency Medicine | Admitting: Emergency Medicine

## 2023-04-22 ENCOUNTER — Emergency Department (HOSPITAL_BASED_OUTPATIENT_CLINIC_OR_DEPARTMENT_OTHER): Payer: Medicare Other | Admitting: Radiology

## 2023-04-22 ENCOUNTER — Other Ambulatory Visit: Payer: Self-pay

## 2023-04-22 DIAGNOSIS — S46911A Strain of unspecified muscle, fascia and tendon at shoulder and upper arm level, right arm, initial encounter: Secondary | ICD-10-CM | POA: Insufficient documentation

## 2023-04-22 DIAGNOSIS — M7580 Other shoulder lesions, unspecified shoulder: Secondary | ICD-10-CM | POA: Diagnosis not present

## 2023-04-22 DIAGNOSIS — M7591 Shoulder lesion, unspecified, right shoulder: Secondary | ICD-10-CM

## 2023-04-22 DIAGNOSIS — S4991XA Unspecified injury of right shoulder and upper arm, initial encounter: Secondary | ICD-10-CM | POA: Diagnosis present

## 2023-04-22 DIAGNOSIS — X58XXXA Exposure to other specified factors, initial encounter: Secondary | ICD-10-CM | POA: Insufficient documentation

## 2023-04-22 NOTE — ED Triage Notes (Signed)
Pt arrives to ED with c/o right shoulder/arm pain that's been on-going for over x1 week. She notes that it may have started during physical therapy.  Hx fibromyalgia.

## 2023-04-22 NOTE — Discharge Instructions (Addendum)
Continue using Tylenol every 4 hours and your muscle relaxant as needed. Your x-ray did not show any broken bones, did show some calcification near the tendon area. Ice packs regular use. Follow-up with sports medicine as discussed to see any doctor available.

## 2023-04-22 NOTE — ED Provider Notes (Signed)
[facility] EMERGENCY DEPARTMENT AT [facility] Provider Note   CSN: [id number] Arrival date & time: 04/22/23  4098     History  Chief Complaint  Patient presents with   Shoulder Pain    Gina [name] is a 47 y.o. female.  Patient with fibromyalgia history presents with persistent right shoulder and upper arm pain that she thinks started during physical therapy exercises.  No direct trauma/falls.  No weakness or numbness.  No blood clot history.  No blood thinner use.  Multiple allergy history.  Patient is tried steroids, narcotics, muscle relaxants with only temporary improvement.  Patient has orthopedic follow-up outpatient.       Home Medications Prior to Admission medications   Medication Sig Start Date End Date Taking? Authorizing Provider  azithromycin (ZITHROMAX Z-PAK) 250 MG tablet Take as directed 01/25/23   Sater, Pearletha Furl, MD  diazepam (VALIUM) 5 MG tablet Take 1 tab at onset of migraine.  May repeat in 2 hrs, if needed.  Max dose: 2 tabs/day. This is a 30 day prescription. 01/18/23   Levert Feinstein, MD  escitalopram (LEXAPRO) 20 MG tablet Take 30 mg by mouth at bedtime. 12/20/18   [provider]  LORazepam (ATIVAN) 1 MG tablet Take 1 tablet (1 mg total) by mouth every 8 (eight) hours as needed for anxiety. 09/14/22 09/14/23  [place], Alvira Philips, DO  Lurasidone HCl 60 MG TABS Take 60 mg by mouth daily. 12/16/22   [provider]  oxyCODONE-acetaminophen (PERCOCET) 7.5-325 MG tablet Take 1 tablet by mouth every 6 (six) hours. 12/02/22   [provider]  promethazine (PHENERGAN) 25 MG tablet Take 1 tablet (25 mg total) by mouth every 6 (six) hours as needed for up to 15 doses for nausea or vomiting. 04/14/23   Terald Sleeper, MD  rizatriptan (MAXALT-MLT) 10 MG disintegrating tablet Take 1 tablet (10 mg total) by mouth as needed. May repeat in 2 hours if needed 01/18/23   Levert Feinstein, MD      Allergies    Lamictal [lamotrigine], Morphine and  codeine, Nicotine, Zofran, Chantix [varenicline tartrate], Ciprofloxacin, Codeine, Haldol [haloperidol], Ibuprofen, Lidoderm [lidocaine], Metaxalone, Sulfa antibiotics, Sulfasalazine, Toradol [ketorolac tromethamine], Duloxetine, Duloxetine hcl, Gabapentin, Ingrezza [valbenazine tosylate], Methadone, Pregabalin, Zolpidem, Zolpidem tartrate, Amoxicillin, Aripiprazole, Penicillins, Tramadol, and Valbenazine    Review of Systems   Review of Systems  Constitutional:  Negative for chills and fever.  HENT:  Negative for congestion.   Respiratory:  Negative for shortness of breath.   Cardiovascular:  Negative for chest pain.  Gastrointestinal:  Negative for abdominal pain and vomiting.  Genitourinary:  Negative for dysuria and flank pain.  Musculoskeletal:  Negative for back pain, neck pain and neck stiffness.  Skin:  Negative for rash.  Neurological:  Negative for weakness, light-headedness and headaches.    Physical Exam Updated Vital Signs BP (!) 108/57 (BP Location: Left Arm)   Pulse 99   Temp 98.2 F (36.8 C) (Oral)   Resp 14   Ht 5\' 7"  (1.702 m)   Wt 99.3 kg   LMP 01/01/2004   SpO2 100%   BMI 34.30 kg/m  Physical Exam Vitals and nursing note reviewed.  Constitutional:      General: She is not in acute distress.    Appearance: She is well-developed.  HENT:     Head: Normocephalic.     Mouth/Throat:     Mouth: Mucous membranes are moist.  Eyes:     General:  Right eye: No discharge.        Left eye: No discharge.     Conjunctiva/sclera: Conjunctivae normal.  Neck:     Trachea: No tracheal deviation.  Cardiovascular:     Rate and Rhythm: Normal rate.  Pulmonary:     Effort: Pulmonary effort is normal.     Breath sounds: Normal breath sounds.  Abdominal:     General: There is no distension.  Musculoskeletal:        General: Tenderness present. No deformity.     Cervical back: Normal range of motion. No rigidity.     Comments: Patient has tenderness to palpation  of anterior superior and lateral right shoulder joint without significant effusion or warmth.  Patient has tenderness with flexion/abduction and empty can testing on the right.  No obvious weakness.  Neurovascular intact.  No significant edema to right upper extremity.  Skin:    General: Skin is warm.     Capillary Refill: Capillary refill takes less than 2 seconds.     Findings: No rash.  Neurological:     General: No focal deficit present.     Mental Status: She is alert.     Cranial Nerves: No cranial nerve deficit.  Psychiatric:        Mood and Affect: Mood normal.     ED Results / Procedures / Treatments   Labs (all labs ordered are listed, but only abnormal results are displayed) Labs Reviewed - No data to display  EKG None  Radiology DG Humerus Right  Result Date: 04/22/2023 CLINICAL DATA:  Pain for 1 year.  History of fibromyalgia EXAM: RIGHT HUMERUS - 2+ VIEW COMPARISON:  None Available. FINDINGS: There is no evidence of fracture or other focal bone lesions. Calcification about the expected location of supraspinatus tendon suggesting calcific tendinosis. Soft tissues are otherwise unremarkable. IMPRESSION: Calcification about the expected location of supraspinatus tendon suggesting calcific tendinosis. No acute osseous abnormality. Electronically Signed   By: Larose Hires D.O.   On: 04/22/2023 09:53    Procedures Procedures    Medications Ordered in ED Medications - No data to display  ED Course/ Medical Decision Making/ A&P                             Medical Decision Making Amount and/or Complexity of Data Reviewed Radiology: ordered.   Patient with fibromyalgia history presents with persistent right shoulder pain worse with movement and palpation.  Clinical concern for tendinitis/rotator strain, bursitis, avulsion fracture given recent physical therapy.  No concern for significant dislocation or fracture given no direct trauma.  X-ray ordered independently  reviewed showing calcification of supraspinatus tendon region.  Discussed close outpatient follow-up with orthopedics/sports medicine for further evaluation and consideration of ultrasound or MRI.  Discussed risks of steroids orally since she is already been on and risks of narcotics and I do not feel narcotics indicated at this time.  Offered Toradol however patient does not tolerate NSAIDs.        Final Clinical Impression(s) / ED Diagnoses Final diagnoses:  Right shoulder strain, initial encounter  Supraspinatus tendonitis, right    Rx / DC Orders ED Discharge Orders     None         Blane Ohara, MD 04/22/23 1003

## 2023-05-03 ENCOUNTER — Emergency Department (HOSPITAL_BASED_OUTPATIENT_CLINIC_OR_DEPARTMENT_OTHER): Payer: Medicare Other | Admitting: Radiology

## 2023-05-03 ENCOUNTER — Emergency Department (HOSPITAL_BASED_OUTPATIENT_CLINIC_OR_DEPARTMENT_OTHER): Payer: Medicare Other

## 2023-05-03 ENCOUNTER — Other Ambulatory Visit: Payer: Self-pay

## 2023-05-03 ENCOUNTER — Encounter (HOSPITAL_BASED_OUTPATIENT_CLINIC_OR_DEPARTMENT_OTHER): Payer: Self-pay

## 2023-05-03 ENCOUNTER — Emergency Department (HOSPITAL_BASED_OUTPATIENT_CLINIC_OR_DEPARTMENT_OTHER)
Admission: EM | Admit: 2023-05-03 | Discharge: 2023-05-03 | Disposition: A | Discharge status: Home / Self Care | Payer: Medicare Other | Attending: Emergency Medicine | Admitting: Emergency Medicine

## 2023-05-03 DIAGNOSIS — M542 Cervicalgia: Secondary | ICD-10-CM | POA: Insufficient documentation

## 2023-05-03 DIAGNOSIS — M25511 Pain in right shoulder: Secondary | ICD-10-CM | POA: Diagnosis not present

## 2023-05-03 LAB — BASIC METABOLIC PANEL
Anion gap: 9 (ref 5–15)
BUN: 12 mg/dL (ref 6–20)
CO2: 26 mmol/L (ref 22–32)
Calcium: 9.3 mg/dL (ref 8.9–10.3)
Chloride: 102 mmol/L (ref 98–111)
Creatinine, Ser: 0.65 mg/dL (ref 0.44–1.00)
GFR, Estimated: >60 mL/min (ref >60)
Glucose, Bld: 93 mg/dL (ref 70–99)
Potassium: 3.7 mmol/L (ref 3.5–5.1)
Sodium: 137 mmol/L (ref 135–145)

## 2023-05-03 LAB — CBC WITH DIFFERENTIAL/PLATELET
Abs Immature Granulocytes: 0.07 10*3/uL (ref 0.00–0.07)
Basophils Absolute: 0 10*3/uL (ref 0.0–0.1)
Basophils Relative: 0 %
Eosinophils Absolute: 0.2 10*3/uL (ref 0.0–0.5)
Eosinophils Relative: 1 %
HCT: 42.3 % (ref 36.0–46.0)
Hemoglobin: 14.6 g/dL (ref 12.0–15.0)
Immature Granulocytes: 1 %
Lymphocytes Relative: 23 %
Lymphs Abs: 2.6 10*3/uL (ref 0.7–4.0)
MCH: 32 pg (ref 26.0–34.0)
MCHC: 34.5 g/dL (ref 30.0–36.0)
MCV: 92.8 fL (ref 80.0–100.0)
Monocytes Absolute: 0.8 10*3/uL (ref 0.1–1.0)
Monocytes Relative: 7 %
Neutro Abs: 7.6 10*3/uL (ref 1.7–7.7)
Neutrophils Relative %: 68 %
Platelets: 243 10*3/uL (ref 150–400)
RBC: 4.56 MIL/uL (ref 3.87–5.11)
RDW: 12.7 % (ref 11.5–15.5)
WBC: 11.3 10*3/uL — ABNORMAL HIGH (ref 4.0–10.5)
nRBC: 0 % (ref 0.0–0.2)

## 2023-05-03 LAB — HCG, SERUM, QUALITATIVE: Preg, Serum: NEGATIVE

## 2023-05-03 MED ORDER — METOCLOPRAMIDE HCL 5 MG/ML IJ SOLN
10.0000 mg | Freq: Once | INTRAMUSCULAR | Status: AC
  Administered 2023-05-03: 10 mg via INTRAVENOUS
  Filled 2023-05-03: qty 2

## 2023-05-03 MED ORDER — DIPHENHYDRAMINE HCL 50 MG/ML IJ SOLN
50.0000 mg | Freq: Once | INTRAMUSCULAR | Status: AC
  Administered 2023-05-03: 50 mg via INTRAVENOUS
  Filled 2023-05-03: qty 1

## 2023-05-03 MED ORDER — IOHEXOL 350 MG/ML SOLN
100.0000 mL | Freq: Once | INTRAVENOUS | Status: AC | PRN
  Administered 2023-05-03: 75 mL via INTRAVENOUS

## 2023-05-03 MED ORDER — FENTANYL CITRATE PF 50 MCG/ML IJ SOSY
100.0000 ug | PREFILLED_SYRINGE | Freq: Once | INTRAMUSCULAR | Status: AC
  Administered 2023-05-03: 100 ug via INTRAVENOUS
  Filled 2023-05-03: qty 2

## 2023-05-03 MED ORDER — FENTANYL CITRATE PF 50 MCG/ML IJ SOSY
100.0000 ug | PREFILLED_SYRINGE | INTRAMUSCULAR | Status: DC | PRN
  Administered 2023-05-03: 100 ug via INTRAVENOUS
  Filled 2023-05-03: qty 2

## 2023-05-03 MED ORDER — OXYCODONE HCL 5 MG PO TABS
10.0000 mg | ORAL_TABLET | Freq: Once | ORAL | Status: AC
  Administered 2023-05-03: 10 mg via ORAL
  Filled 2023-05-03: qty 2

## 2023-05-03 MED ORDER — OXYCODONE HCL 5 MG PO TABS: 5.0000 mg | ORAL_TABLET | Freq: Four times a day (QID) | ORAL | 0 refills | Status: DC | PRN

## 2023-05-03 NOTE — ED Notes (Signed)
Patient given peanut butter and crackers. 

## 2023-05-03 NOTE — ED Notes (Signed)
Spoke with lab to change hcg Beta to HCG qual

## 2023-05-03 NOTE — ED Triage Notes (Addendum)
Patient here POV from Home.  Endorses Right Shoulder Pain that began 3 Weeks ago during PT. Seen for same 1.5 Weeks ago after Injury. Pain has since worsened and is radiating to Neck as well. No radiating Numbness or Tingling. Worse with movement.  Sent for Assessment by UC. Hydrocodone without relief.   Uncomfortable during Triage. A&Ox4. GCS 15. Ambulatory.

## 2023-05-03 NOTE — ED Notes (Signed)
Pt endorses ws driven to ED.

## 2023-05-03 NOTE — ED Notes (Signed)
Patient walking in hallway back from the bathroom, states medication is not working. Dr. Doran Durand notified.

## 2023-05-03 NOTE — ED Provider Notes (Signed)
[place] EMERGENCY DEPARTMENT AT [facility] Provider Note   CSN: [id number] Arrival date & time: 05/03/23  1335     History Chief Complaint  Patient presents with   Shoulder Pain    HPI Gina [name] is a 47 y.o. female presenting for exquisite right neck and right shoulder pain.. Patient states that her pain has been present for weeks.  She did not it initially started as a shoulder injury and she has a longstanding history of chronic pain disorder.  However, recently she saw a chiropractor who she has been with for years and underwent a right sided neck manipulation.  Patient states that recently, her symptoms are only getting worse despite hydrocodone, muscle relaxer, steroid injections.  States that the pain is now starting to go into her head. Patient's recorded medical, surgical, social, medication list and allergies were reviewed in the Snapshot window as part of the initial history.   Review of Systems   Review of Systems  Constitutional:  Negative for chills and fever.  HENT:  Negative for ear pain and sore throat.   Eyes:  Negative for pain and visual disturbance.  Respiratory:  Negative for cough and shortness of breath.   Cardiovascular:  Negative for chest pain and palpitations.  Gastrointestinal:  Negative for abdominal pain and vomiting.  Genitourinary:  Negative for dysuria and hematuria.  Musculoskeletal:  Positive for neck pain. Negative for arthralgias and back pain.  Skin:  Negative for color change and rash.  Neurological:  Negative for seizures and syncope.  All other systems reviewed and are negative.   Physical Exam Updated Vital Signs BP 121/77 (BP Location: Right Arm)   Pulse 64   Temp 98.2 F (36.8 C) (Oral)   Resp 18   Ht 5\' 7"  (1.702 m)   Wt 99.3 kg   LMP 01/01/2004   SpO2 100%   BMI 34.29 kg/m  Physical Exam Vitals and nursing note reviewed.  Constitutional:      General: She is not in acute distress.    Appearance: She  is well-developed.  HENT:     Head: Normocephalic and atraumatic.  Eyes:     Conjunctiva/sclera: Conjunctivae normal.  Cardiovascular:     Rate and Rhythm: Normal rate and regular rhythm.     Heart sounds: No murmur heard. Pulmonary:     Effort: Pulmonary effort is normal. No respiratory distress.     Breath sounds: Normal breath sounds.  Abdominal:     General: There is no distension.     Palpations: Abdomen is soft.     Tenderness: There is no abdominal tenderness. There is no right CVA tenderness or left CVA tenderness.  Musculoskeletal:        General: No swelling or tenderness. Normal range of motion.     Cervical back: Neck supple.  Skin:    General: Skin is warm and dry.  Neurological:     General: No focal deficit present.     Mental Status: She is alert and oriented to person, place, and time. Mental status is at baseline.     Cranial Nerves: No cranial nerve deficit.      ED Course/ Medical Decision Making/ A&P    Procedures .Critical Care  Performed by: Glyn Ade, MD Authorized by: Glyn Ade, MD   Critical care provider statement:    Critical care time (minutes):  30   Critical care was necessary to treat or prevent imminent or life-threatening deterioration of the following conditions: Serial  narcotic administration for pain control.   Critical care was time spent personally by me on the following activities:  Development of treatment plan with patient or surrogate, discussions with consultants, evaluation of patient's response to treatment, examination of patient, ordering and review of laboratory studies, ordering and review of radiographic studies, ordering and performing treatments and interventions, pulse oximetry, re-evaluation of patient's condition and review of old charts    Medications Ordered in ED Medications  fentaNYL (SUBLIMAZE) injection 100 mcg (100 mcg Intravenous Given 05/03/23 1611)  metoCLOPramide (REGLAN) injection 10 mg (10  mg Intravenous Given 05/03/23 1610)  diphenhydrAMINE (BENADRYL) injection 50 mg (50 mg Intravenous Given 05/03/23 1610)  iohexol (OMNIPAQUE) 350 MG/ML injection 100 mL (75 mLs Intravenous Contrast Given 05/03/23 1721)  oxyCODONE (Oxy IR/ROXICODONE) immediate release tablet 10 mg (10 mg Oral Given 05/03/23 1828)  fentaNYL (SUBLIMAZE) injection 100 mcg (100 mcg Intravenous Given 05/03/23 1920)   Medical Decision Making:   Gina [name] is a 47 y.o. female who presented to the ED today with acute upper cervical pain over the past 72 hours, detailed above.    Additional history discussed with patient's family/caregivers.  Patient's presentation is complicated by their history of extensive medical history.  Patient placed on continuous vitals and telemetry monitoring while in ED which was reviewed periodically.   On my initial exam, the pt was with an intact neurologic exam, tolerating ambulation with an antalgic gait and p.o. intake without difficulty.  Patient had no abnormal DTRs, no midline spinal tenderness.  Patient endorsing complete sensation of the perineum.  Patient without episodes of fecal or urinary incontinence.  Patient has no focal neurologic deficits and reassuring vital signs at this time.  No obvious physical abnormality or injury on exam. Notably, patient denies recent trauma, is afebrile, and denies IVDU.    Reviewed and confirmed nursing documentation for past medical history, family history, social history.    Initial Assessment:   With the patient's presentation of acute back pain in the above setting, most likely diagnosis is musculoskeletal strain. Other diagnoses were considered including (but not limited to) underlying fracture, epidural hematoma, vascular dissection, spinal stenosis, spinal malignancy. These are considered less likely due to history of present illness and physical exam findings.   In particular, lack of fever, substantial history of IV drug use, or substantial  neurologic abnormality is less consistent with epidural abscess versus discitis or other spinal infection. Ambulation which she describes hide sitting.  She is going further diagnostic care and management  Initial Plan:  Multimodal pain control described and patient informed on safe usage.  Patient care will be further escalated with CTA head and neck to evaluate for vascular injury.  Screening labs she is ensure patient is able to get cross-sectional imaging, symptomatic management.    Initial Study Results:   Radiology  CT ANGIO HEAD NECK W WO CM  Final Result    DG Shoulder Right  Final Result      Reassessment: On reassessment, the acute evaluation has been negative.  No further pathology detected on extensive evaluation today.  No evidence of vascular dissection or acute cervical fracture. Patient initially had improvement with IV fentanyl but is requesting further dosing.  Tried p.o. oxycodone with some mild improvement but patient is stating that she is still having severe pain. Well-appearing on serial exams, still endorsing severe pain. Patient requesting hydromorphone IV times throughout the evaluation.  Have tried to reeducate patient about my reservations regarding a long-acting IV opiate  with plans for short-term discharge as I will be unable to continue observing her.  Treated with IV fentanyl with serial improvement. Required multiple doses of controlled substances to get pain control.  Disposition:   Based on the above findings, I believe patient is stable for discharge.    Patient and family educated about specific return precautions for given chief complaint and symptoms.  Patient and family educated about follow-up with PCP and orthopedic provider.  Patient and family expressed understanding of return precautions and need for follow-up. Patient spoken to regarding all imaging and laboratory results and appropriate follow up for these results. All education provided in  verbal and written form and time was allowed for answering of patient questions. Patient discharged.          Emergency Department Medication Summary:   Medications  fentaNYL (SUBLIMAZE) injection 100 mcg (100 mcg Intravenous Given 05/03/23 1611)  metoCLOPramide (REGLAN) injection 10 mg (10 mg Intravenous Given 05/03/23 1610)  diphenhydrAMINE (BENADRYL) injection 50 mg (50 mg Intravenous Given 05/03/23 1610)  iohexol (OMNIPAQUE) 350 MG/ML injection 100 mL (75 mLs Intravenous Contrast Given 05/03/23 1721)  oxyCODONE (Oxy IR/ROXICODONE) immediate release tablet 10 mg (10 mg Oral Given 05/03/23 1828)  fentaNYL (SUBLIMAZE) injection 100 mcg (100 mcg Intravenous Given 05/03/23 1920)      Clinical Impression:  1. Neck pain      Discharge   Final Clinical Impression(s) / ED Diagnoses Final diagnoses:  Neck pain    Rx / DC Orders ED Discharge Orders          Ordered    oxyCODONE (ROXICODONE) 5 MG immediate release tablet  Every 6 hours PRN        05/03/23 1927              Glyn Ade, MD 05/03/23 1929

## 2023-05-09 ENCOUNTER — Telehealth: Payer: Self-pay

## 2023-05-09 NOTE — Telephone Encounter (Signed)
Transition Care Management Follow-up Telephone Call Date of discharge and from where: 05/03/2023 [facility] How have you been since you were released from the hospital? Patient is still in pain but it is getting better Any questions or concerns? No  Items Reviewed: Did the pt receive and understand the discharge instructions provided? Yes  Medications obtained and verified? Yes  Other? No  Any new allergies since your discharge? No  Dietary orders reviewed? Yes Do you have support at home? Yes   Follow up appointments reviewed:  PCP Hospital f/u appt confirmed?  Patient stated she will call for appointment this week.  Scheduled to see  on  @ . Specialist Hospital f/u appt confirmed? Yes  Scheduled to see  on 05/18/2023 @ [facility]. Are transportation arrangements needed? No  If their condition worsens, is the pt aware to call PCP or go to the Emergency Dept.? Yes Was the patient provided with contact information for the PCP's office or ED? Yes Was to pt encouraged to call back with questions or concerns? Yes  Kingdavid Leinbach [facility]  [facility] Population Health Community Resource Care Guide   [email or web address]  ?? [phone]   Website: [email or web address]  [email or web address]

## 2023-06-14 ENCOUNTER — Other Ambulatory Visit: Payer: Self-pay

## 2023-06-14 ENCOUNTER — Encounter (HOSPITAL_BASED_OUTPATIENT_CLINIC_OR_DEPARTMENT_OTHER): Payer: Self-pay | Admitting: Emergency Medicine

## 2023-06-14 DIAGNOSIS — R197 Diarrhea, unspecified: Secondary | ICD-10-CM | POA: Diagnosis not present

## 2023-06-14 DIAGNOSIS — F17200 Nicotine dependence, unspecified, uncomplicated: Secondary | ICD-10-CM | POA: Insufficient documentation

## 2023-06-14 DIAGNOSIS — R1084 Generalized abdominal pain: Secondary | ICD-10-CM | POA: Insufficient documentation

## 2023-06-14 DIAGNOSIS — R600 Localized edema: Secondary | ICD-10-CM | POA: Insufficient documentation

## 2023-06-14 DIAGNOSIS — R110 Nausea: Secondary | ICD-10-CM | POA: Diagnosis not present

## 2023-06-14 DIAGNOSIS — R109 Unspecified abdominal pain: Secondary | ICD-10-CM | POA: Diagnosis present

## 2023-06-14 DIAGNOSIS — M2550 Pain in unspecified joint: Secondary | ICD-10-CM | POA: Insufficient documentation

## 2023-06-14 LAB — CBC
HCT: 38 % (ref 36.0–46.0)
Hemoglobin: 13.2 g/dL (ref 12.0–15.0)
MCH: 32.3 pg (ref 26.0–34.0)
MCHC: 34.7 g/dL (ref 30.0–36.0)
MCV: 92.9 fL (ref 80.0–100.0)
Platelets: 258 10*3/uL (ref 150–400)
RBC: 4.09 MIL/uL (ref 3.87–5.11)
RDW: 12.4 % (ref 11.5–15.5)
WBC: 10.5 10*3/uL (ref 4.0–10.5)
nRBC: 0 % (ref 0.0–0.2)

## 2023-06-14 LAB — BASIC METABOLIC PANEL
Anion gap: 9 (ref 5–15)
BUN: 9 mg/dL (ref 6–20)
CO2: 25 mmol/L (ref 22–32)
Calcium: 9 mg/dL (ref 8.9–10.3)
Chloride: 103 mmol/L (ref 98–111)
Creatinine, Ser: 0.59 mg/dL (ref 0.44–1.00)
GFR, Estimated: >60 mL/min (ref >60)
Glucose, Bld: 113 mg/dL — ABNORMAL HIGH (ref 70–99)
Potassium: 3.9 mmol/L (ref 3.5–5.1)
Sodium: 137 mmol/L (ref 135–145)

## 2023-06-14 NOTE — ED Triage Notes (Signed)
Pt in with ongoing joint pain and swelling that has worsened today. Pain now to abdomen and back. Pt also states she has gained 85lb in 1 yr. Pt states she is currently under evaluation by PCP and specialists for possible Cushing's disease vs autoimmune vs genetic disorders. Reports home Hydrocodone is not touching her pain

## 2023-06-15 ENCOUNTER — Emergency Department (HOSPITAL_BASED_OUTPATIENT_CLINIC_OR_DEPARTMENT_OTHER)
Admission: EM | Admit: 2023-06-15 | Discharge: 2023-06-15 | Disposition: A | Discharge status: Home / Self Care | Payer: Medicare Other | Attending: Emergency Medicine | Admitting: Emergency Medicine

## 2023-06-15 ENCOUNTER — Emergency Department (HOSPITAL_BASED_OUTPATIENT_CLINIC_OR_DEPARTMENT_OTHER): Payer: Medicare Other

## 2023-06-15 DIAGNOSIS — M2550 Pain in unspecified joint: Secondary | ICD-10-CM

## 2023-06-15 DIAGNOSIS — R109 Unspecified abdominal pain: Secondary | ICD-10-CM

## 2023-06-15 MED ORDER — PROMETHAZINE HCL 25 MG PO TABS
25.0000 mg | ORAL_TABLET | Freq: Once | ORAL | Status: AC
  Administered 2023-06-15: 25 mg via ORAL
  Filled 2023-06-15: qty 1

## 2023-06-15 MED ORDER — KETAMINE HCL 10 MG/ML IJ SOLN
0.3000 mg/kg | Freq: Once | INTRAMUSCULAR | Status: AC
  Administered 2023-06-15: 31 mg via INTRAVENOUS
  Filled 2023-06-15: qty 1

## 2023-06-15 MED ORDER — IOHEXOL 300 MG/ML  SOLN
100.0000 mL | Freq: Once | INTRAMUSCULAR | Status: AC | PRN
  Administered 2023-06-15: 100 mL via INTRAVENOUS

## 2023-06-15 NOTE — ED Notes (Signed)
Pt drank to take promethazine pill and has kept all down with out difficulty

## 2023-06-15 NOTE — ED Provider Notes (Signed)
[place] EMERGENCY DEPARTMENT AT [facility] Provider Note   CSN: [id number] Arrival date & time: 06/14/23  2135     History  Chief Complaint  Patient presents with   Joint Swelling   Generalized Body Aches    Gina [name] is a 47 y.o. female.  HPI     This is a 47 year old with complex medical history who presents with abdominal pain, polyarthralgia, swelling.  Patient reports she has had multiple medical problems over the last year including sustained a 5 pound weight gain, joint swelling, recurrent abdominal pain.  She states that she is seeing multiple specialists including endocrinology and rheumatology.  Over the last 24 hours she has had increasing abdominal pain and swelling.  She feels like she is having swelling in her legs and abdomen.  She requested a diuretic by her primary doctor although she has never had a diuretic.  Previously patient was on chronic pain medication but states that she weaned herself off 2 years ago but recently has had multiple intermittent prescriptions for narcotic pain medication to help alleviate her pain.  Reports nausea.  She also reports ongoing diarrhea.  Denies blood in her stools.  No fevers.  Reviewed.  Patient has had extensive workup including abdominal CT, MRI, endoscopy and multiple subspecialty evaluations.  She also has a listed history of chronic pain syndrome.  Home Medications Prior to Admission medications   Medication Sig Start Date End Date Taking? Authorizing Provider  azithromycin (ZITHROMAX Z-PAK) 250 MG tablet Take as directed 01/25/23   Sater, Pearletha Furl, MD  diazepam (VALIUM) 5 MG tablet Take 1 tab at onset of migraine.  May repeat in 2 hrs, if needed.  Max dose: 2 tabs/day. This is a 30 day prescription. 01/18/23   Levert Feinstein, MD  escitalopram (LEXAPRO) 20 MG tablet Take 30 mg by mouth at bedtime. 12/20/18   [provider]  LORazepam (ATIVAN) 1 MG tablet Take 1 tablet (1 mg total) by mouth every 8  (eight) hours as needed for anxiety. 09/14/22 09/14/23  [place], Alvira Philips, DO  Lurasidone HCl 60 MG TABS Take 60 mg by mouth daily. 12/16/22   [provider]  oxyCODONE (ROXICODONE) 5 MG immediate release tablet Take 1 tablet (5 mg total) by mouth every 6 (six) hours as needed for severe pain. 05/03/23   Glyn Ade, MD  oxyCODONE-acetaminophen (PERCOCET) 7.5-325 MG tablet Take 1 tablet by mouth every 6 (six) hours. 12/02/22   [provider]  promethazine (PHENERGAN) 25 MG tablet Take 1 tablet (25 mg total) by mouth every 6 (six) hours as needed for up to 15 doses for nausea or vomiting. 04/14/23   Terald Sleeper, MD  rizatriptan (MAXALT-MLT) 10 MG disintegrating tablet Take 1 tablet (10 mg total) by mouth as needed. May repeat in 2 hours if needed 01/18/23   Levert Feinstein, MD      Allergies    Lamictal [lamotrigine], Morphine and codeine, Nicotine, Zofran, Chantix [varenicline tartrate], Ciprofloxacin, Codeine, Haldol [haloperidol], Ibuprofen, Lidoderm [lidocaine], Metaxalone, Sulfa antibiotics, Sulfasalazine, Toradol [ketorolac tromethamine], Duloxetine, Duloxetine hcl, Gabapentin, Ingrezza [valbenazine tosylate], Methadone, Pregabalin, Zolpidem, Zolpidem tartrate, Amoxicillin, Aripiprazole, Penicillins, Tramadol, and Valbenazine    Review of Systems   Review of Systems  Constitutional:  Negative for fever.  Gastrointestinal:  Positive for abdominal pain, diarrhea and nausea.  All other systems reviewed and are negative.   Physical Exam Updated Vital Signs BP 121/82   Pulse (!) 110   Temp 98.5 F (36.9 C) (Oral)  Resp 16   Wt 102.5 kg   LMP 01/01/2004   SpO2 97%   BMI 35.40 kg/m  Physical Exam Vitals and nursing note reviewed.  Constitutional:      Appearance: She is well-developed. She is obese. She is not ill-appearing.  HENT:     Head: Normocephalic and atraumatic.  Eyes:     Pupils: Pupils are equal, round, and reactive to light.  Cardiovascular:      Rate and Rhythm: Normal rate and regular rhythm.     Heart sounds: Normal heart sounds.  Pulmonary:     Effort: Pulmonary effort is normal. No respiratory distress.     Breath sounds: No wheezing.  Abdominal:     General: Bowel sounds are normal.     Palpations: Abdomen is soft.     Tenderness: There is abdominal tenderness.     Comments: Generalized tenderness to palpation, no rebound or guarding  Musculoskeletal:     Cervical back: Neck supple.     Right lower leg: Edema present.     Left lower leg: Edema present.     Comments: Trace bilateral lower extremity edema, diffuse slight swelling of all digits bilateral hands, not isolated to the joint  Skin:    General: Skin is warm and dry.  Neurological:     Mental Status: She is alert and oriented to person, place, and time.  Psychiatric:        Mood and Affect: Mood normal.     ED Results / Procedures / Treatments   Labs (all labs ordered are listed, but only abnormal results are displayed) Labs Reviewed  BASIC METABOLIC PANEL - Abnormal; Notable for the following components:      Result Value   Glucose, Bld 113 (*)    All other components within normal limits  CBC  URINALYSIS, ROUTINE W REFLEX MICROSCOPIC    EKG EKG Interpretation Date/Time:  Tuesday June 14 2023 22:15:32 EDT Ventricular Rate:  70 PR Interval:  134 QRS Duration:  70 QT Interval:  380 QTC Calculation: 410 R Axis:   52  Text Interpretation: Normal sinus rhythm Normal ECG When compared with ECG of 04-Nov-2022 16:46, No significant change was found Confirmed by Ross Marcus ([id number]) on 06/15/2023 1:33:04 AM  Radiology CT ABDOMEN PELVIS W CONTRAST  Result Date: 06/15/2023 CLINICAL DATA:  Abdominal pain, acute, nonlocalized EXAM: CT ABDOMEN AND PELVIS WITH CONTRAST TECHNIQUE: Multidetector CT imaging of the abdomen and pelvis was performed using the standard protocol following bolus administration of intravenous contrast. RADIATION DOSE REDUCTION:  This exam was performed according to the departmental dose-optimization program which includes automated exposure control, adjustment of the mA and/or kV according to patient size and/or use of iterative reconstruction technique. CONTRAST:  OMNIPAQUE IOHEXOL 300 MG/ML  SOLN COMPARISON:  CT abdomen pelvis 11/18/2022 FINDINGS: Lower chest: No acute abnormality. Hepatobiliary: Status post cholecystectomy. Pancreas: No focal lesion. Normal pancreatic contour. No surrounding inflammatory changes. No main pancreatic ductal dilatation. Spleen: Normal in size without focal abnormality. Adrenals/Urinary Tract: No adrenal nodule bilaterally. 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 Bilateral kidneys enhance symmetrically. Subcentimeter hypodensity too small to characterize. Fluid density lesion within the right kidney likely represents a simple renal cyst. Simple renal cysts, in the absence of clinically indicated signs/symptoms, require no independent follow-up. No hydronephrosis. No hydroureter.  No nephroureterolithiasis. The urinary bladder is unremarkable. Stomach/Bowel: Stomach is within normal limits. No evidence of bowel wall thickening or dilatation. Medialized mobile cecum. Appendix appears normal. Vascular/Lymphatic:  No abdominal aorta or iliac aneurysm. No abdominal, pelvic, or inguinal lymphadenopathy. Reproductive: Status post hysterectomy. No adnexal masses. Other: No intraperitoneal free fluid. No intraperitoneal free gas. No organized fluid collection. Musculoskeletal: No abdominal wall hernia or abnormality. No suspicious lytic or blastic osseous lesions. No acute displaced fracture. Multilevel degenerative changes of the spine. IMPRESSION: No acute intra-abdominal or intrapelvic abnormality. Electronically Signed   By: Tish Frederickson M.D.   On: 06/15/2023 02:31    Procedures Procedures    Medications Ordered in ED Medications  ketamine (KETALAR) injection 31 mg  (31 mg Intravenous Given 06/15/23 0224)  iohexol (OMNIPAQUE) 300 MG/ML solution 100 mL (100 mLs Intravenous Contrast Given 06/15/23 0214)  promethazine (PHENERGAN) tablet 25 mg (25 mg Oral Given 06/15/23 0254)    ED Course/ Medical Decision Making/ A&P                                 Medical Decision Making Amount and/or Complexity of Data Reviewed Labs: ordered. Radiology: ordered.  Risk Prescription drug management.   This patient presents to the ED for concern of abdominal pain, swelling, this involves an extensive number of treatment options, and is a complaint that carries with it a high risk of complications and morbidity.  I considered the following differential and admission for this acute, potentially life threatening condition.  The differential diagnosis includes acute process such as appendicitis, obstruction, gallbladder pathology, gastritis, chronic pain  MDM:    This is a 47 year old female with multiple complaints and recent history and workup which has been fairly extensive.  She is overall nontoxic.  Vital signs are reassuring.  She is afebrile.  She has a diffuse abdominal tenderness.  I have extensively reviewed her chart.  She has had multiple workups including imaging and procedures including endoscopy and ex lap.  She was just seen by new primary care physician.  Labs are largely reassuring at this time.  CT imaging does not show any new or emergent intra-abdominal process.  I discussed this with the patient.  Given her complex history and history of chronic pain, feel she is best served by following up closely with her specialist and her primary physicians.  Discussed with her that I cannot provide her with further pain medication given recent prescription history.  (Labs, imaging, consults)  Labs: I Ordered, and personally interpreted labs.  The pertinent results include: CBC, CMP, lipase  Imaging Studies ordered: I ordered imaging studies including CT abdomen  pelvis I independently visualized and interpreted imaging. I agree with the radiologist interpretation  Additional history obtained from chart review.  External records from outside source obtained and reviewed including prior.  Primary notes  Cardiac Monitoring: The patient was maintained on a cardiac monitor.  If on the cardiac monitor, I personally viewed and interpreted the cardiac monitored which showed an underlying rhythm of: Sinus rhythm  Reevaluation: After the interventions noted above, I reevaluated the patient and found that they have :stayed the same  Social Determinants of Health:  lives independently  Disposition: Discharge  Co morbidities that complicate the patient evaluation  Past Medical History:  Diagnosis Date   ABDOMINAL WALL HERNIA 02/27/2010   Abnormal Pap smear    ALLERGIC RHINITIS 06/02/2007   ANOREXIA, CHRONIC 08/07/2008   ANXIETY 09/15/2010   Arthritis    BACK PAIN, THORACIC REGION 07/07/2007   Bipolar disorder (HCC)    CERVICAL RADICULOPATHY 12/11/2008   Condyloma acuminatum 04/23/2009  CONSTIPATION 09/15/2010   DYSPHAGIA UNSPECIFIED 09/09/2009   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   Eating disorder    ENDOMETRIOSIS 12/16/2009   FATIGUE 06/11/2010   Fibromyalgia    GERD 10/23/2009   Heart palpitations    HERPES SIMPLEX INFECTION 06/11/2010   LENTIGO 04/23/2009   LUNG NODULE 06/17/2010   Narcotic abuse, continuous (HCC)    Ovarian cyst    Palpitations 10/23/2009   Panic disorder    Renal disorder    Rheumatoid arthritis(714.0)    Stricture and stenosis of esophagus 05/13/2010   TOBACCO ABUSE 08/07/2008   Urinary tract infection    UTI (urinary tract infection)      Medicines Meds ordered this encounter  Medications   ketamine (KETALAR) injection 31 mg   iohexol (OMNIPAQUE) 300 MG/ML solution 100 mL   promethazine (PHENERGAN) tablet 25 mg    I have reviewed the patients home medicines and have made adjustments as needed  Problem List / ED  Course: Problem List Items Addressed This Visit       Other   Abdominal pain - Primary   Other Visit Diagnoses     Polyarthralgia                       Final Clinical Impression(s) / ED Diagnoses Final diagnoses:  Abdominal pain, unspecified abdominal location  Polyarthralgia    Rx / DC Orders ED Discharge Orders     None         Shon Baton, MD 06/15/23 [phone]

## 2023-06-15 NOTE — Discharge Instructions (Signed)
You were seen today for abdominal pain and swelling.  Your workup today is reassuring.  Follow-up closely with your primary doctor and specialist for ongoing workup given chronicity of your symptoms.

## 2023-06-29 ENCOUNTER — Emergency Department (HOSPITAL_BASED_OUTPATIENT_CLINIC_OR_DEPARTMENT_OTHER): Payer: Medicare Other

## 2023-06-29 ENCOUNTER — Other Ambulatory Visit: Payer: Self-pay

## 2023-06-29 ENCOUNTER — Emergency Department (HOSPITAL_BASED_OUTPATIENT_CLINIC_OR_DEPARTMENT_OTHER)
Admission: EM | Admit: 2023-06-29 | Discharge: 2023-06-29 | Disposition: A | Discharge status: Home / Self Care | Payer: Medicare Other | Attending: Emergency Medicine | Admitting: Emergency Medicine

## 2023-06-29 DIAGNOSIS — R197 Diarrhea, unspecified: Secondary | ICD-10-CM | POA: Insufficient documentation

## 2023-06-29 DIAGNOSIS — R1031 Right lower quadrant pain: Secondary | ICD-10-CM | POA: Insufficient documentation

## 2023-06-29 LAB — BASIC METABOLIC PANEL
Anion gap: 7 (ref 5–15)
BUN: 13 mg/dL (ref 6–20)
CO2: 27 mmol/L (ref 22–32)
Calcium: 9.3 mg/dL (ref 8.9–10.3)
Chloride: 103 mmol/L (ref 98–111)
Creatinine, Ser: 0.62 mg/dL (ref 0.44–1.00)
GFR, Estimated: >60 mL/min (ref >60)
Glucose, Bld: 97 mg/dL (ref 70–99)
Potassium: 3.9 mmol/L (ref 3.5–5.1)
Sodium: 137 mmol/L (ref 135–145)

## 2023-06-29 LAB — URINALYSIS, ROUTINE W REFLEX MICROSCOPIC
Bilirubin Urine: NEGATIVE
Glucose, UA: NEGATIVE mg/dL
Hgb urine dipstick: NEGATIVE
Ketones, ur: NEGATIVE mg/dL
Leukocytes,Ua: NEGATIVE
Nitrite: NEGATIVE
Protein, ur: NEGATIVE mg/dL
Specific Gravity, Urine: 1.01 (ref 1.005–1.030)
pH: 6 (ref 5.0–8.0)

## 2023-06-29 LAB — CBC
HCT: 41.8 % (ref 36.0–46.0)
Hemoglobin: 14.1 g/dL (ref 12.0–15.0)
MCH: 31.8 pg (ref 26.0–34.0)
MCHC: 33.7 g/dL (ref 30.0–36.0)
MCV: 94.4 fL (ref 80.0–100.0)
Platelets: 219 10*3/uL (ref 150–400)
RBC: 4.43 MIL/uL (ref 3.87–5.11)
RDW: 12.8 % (ref 11.5–15.5)
WBC: 10.7 10*3/uL — ABNORMAL HIGH (ref 4.0–10.5)
nRBC: 0 % (ref 0.0–0.2)

## 2023-06-29 LAB — LIPASE, BLOOD: Lipase: 14 U/L (ref 11–51)

## 2023-06-29 MED ORDER — LORAZEPAM 2 MG/ML IJ SOLN
1.0000 mg | Freq: Once | INTRAMUSCULAR | Status: AC
  Administered 2023-06-29: 1 mg via INTRAVENOUS
  Filled 2023-06-29: qty 1

## 2023-06-29 MED ORDER — SODIUM CHLORIDE 0.9 % IV SOLN
25.0000 mg | Freq: Once | INTRAVENOUS | Status: AC
  Administered 2023-06-29: 25 mg via INTRAVENOUS
  Filled 2023-06-29: qty 1

## 2023-06-29 MED ORDER — IOHEXOL 300 MG/ML  SOLN
100.0000 mL | Freq: Once | INTRAMUSCULAR | Status: AC | PRN
  Administered 2023-06-29: 100 mL via INTRAVENOUS

## 2023-06-29 MED ORDER — KETAMINE HCL 10 MG/ML IJ SOLN
30.0000 mg | Freq: Once | INTRAMUSCULAR | Status: AC
  Administered 2023-06-29: 30 mg via INTRAVENOUS
  Filled 2023-06-29: qty 1

## 2023-06-29 MED ORDER — PROMETHAZINE HCL 25 MG/ML IJ SOLN
INTRAMUSCULAR | Status: AC
  Filled 2023-06-29: qty 1

## 2023-06-29 MED ORDER — HYDROMORPHONE HCL 1 MG/ML IJ SOLN
1.0000 mg | Freq: Once | INTRAMUSCULAR | Status: AC
  Administered 2023-06-29: 1 mg via INTRAVENOUS
  Filled 2023-06-29: qty 1

## 2023-06-29 MED ORDER — SODIUM CHLORIDE 0.9 % IV BOLUS
1000.0000 mL | Freq: Once | INTRAVENOUS | Status: AC
  Administered 2023-06-29: 1000 mL via INTRAVENOUS

## 2023-06-29 NOTE — ED Provider Notes (Signed)
EMERGENCY DEPARTMENT AT [facility] Provider Note   CSN: [id number] Arrival date & time: 06/29/23  1524     History  Chief Complaint  Patient presents with   Abdominal Pain    Gina [name] is a 47 y.o. female.  The history is provided by the patient, a relative and medical records. No language interpreter was used.  Abdominal Pain Pain location:  R flank and RLQ Pain quality: aching and sharp   Pain radiates to:  R flank, back and RLQ Pain severity:  Severe Onset quality:  Sudden Duration:  8 hours Timing:  Constant Progression:  Worsening Chronicity:  Recurrent Relieved by:  Nothing Worsened by:  Palpation Ineffective treatments:  None tried Associated symptoms: diarrhea (chronic and unchanged from baseline), nausea and vomiting   Associated symptoms: no chest pain, no chills, no constipation, no cough, no dysuria, no fatigue, no fever, no shortness of breath, no vaginal bleeding and no vaginal discharge        Home Medications Prior to Admission medications   Medication Sig Start Date End Date Taking? Authorizing Provider  azithromycin (ZITHROMAX Z-PAK) 250 MG tablet Take as directed 01/25/23   Sater, Pearletha Furl, MD  diazepam (VALIUM) 5 MG tablet Take 1 tab at onset of migraine.  May repeat in 2 hrs, if needed.  Max dose: 2 tabs/day. This is a 30 day prescription. 01/18/23   Levert Feinstein, MD  escitalopram (LEXAPRO) 20 MG tablet Take 30 mg by mouth at bedtime. 12/20/18   [provider]  LORazepam (ATIVAN) 1 MG tablet Take 1 tablet (1 mg total) by mouth every 8 (eight) hours as needed for anxiety. 09/14/22 09/14/23  [place], Alvira Philips, DO  Lurasidone HCl 60 MG TABS Take 60 mg by mouth daily. 12/16/22   [provider]  oxyCODONE (ROXICODONE) 5 MG immediate release tablet Take 1 tablet (5 mg total) by mouth every 6 (six) hours as needed for severe pain. 05/03/23   Glyn Ade, MD  oxyCODONE-acetaminophen (PERCOCET) 7.5-325 MG tablet  Take 1 tablet by mouth every 6 (six) hours. 12/02/22   [provider]  promethazine (PHENERGAN) 25 MG tablet Take 1 tablet (25 mg total) by mouth every 6 (six) hours as needed for up to 15 doses for nausea or vomiting. 04/14/23   Terald Sleeper, MD  rizatriptan (MAXALT-MLT) 10 MG disintegrating tablet Take 1 tablet (10 mg total) by mouth as needed. May repeat in 2 hours if needed 01/18/23   Levert Feinstein, MD      Allergies    Lamictal [lamotrigine], Morphine and codeine, Nicotine, Zofran, Chantix [varenicline tartrate], Ciprofloxacin, Codeine, Haldol [haloperidol], Ibuprofen, Lidoderm [lidocaine], Metaxalone, Sulfa antibiotics, Sulfasalazine, Toradol [ketorolac tromethamine], Duloxetine, Duloxetine hcl, Gabapentin, Ingrezza [valbenazine tosylate], Methadone, Pregabalin, Zolpidem, Zolpidem tartrate, Amoxicillin, Aripiprazole, Penicillins, Tramadol, and Valbenazine    Review of Systems   Review of Systems  Constitutional:  Negative for chills, diaphoresis, fatigue and fever.  HENT:  Negative for congestion.   Respiratory:  Negative for cough, chest tightness and shortness of breath.   Cardiovascular:  Negative for chest pain, palpitations and leg swelling.  Gastrointestinal:  Positive for abdominal pain, diarrhea (chronic and unchanged from baseline), nausea and vomiting. Negative for abdominal distention, blood in stool, constipation and rectal pain.  Genitourinary:  Positive for flank pain. Negative for dysuria, frequency, vaginal bleeding and vaginal discharge.  Musculoskeletal:  Positive for back pain. Negative for neck pain and neck stiffness.  Skin:  Negative for rash and wound.  Neurological:  Negative for numbness and headaches.  Psychiatric/Behavioral:  Negative for agitation and confusion.   All other systems reviewed and are negative.   Physical Exam Updated Vital Signs BP 114/78 (BP Location: Right Arm)   Pulse (!) 103   Temp 98.4 F (36.9 C)   Resp 19   LMP 01/01/2004    SpO2 97%  Physical Exam Vitals and nursing note reviewed.  Constitutional:      General: She is not in acute distress.    Appearance: She is well-developed. She is not ill-appearing, toxic-appearing or diaphoretic.  HENT:     Head: Normocephalic and atraumatic.  Eyes:     Conjunctiva/sclera: Conjunctivae normal.  Cardiovascular:     Rate and Rhythm: Normal rate and regular rhythm.     Heart sounds: No murmur heard. Pulmonary:     Effort: Pulmonary effort is normal. No respiratory distress.     Breath sounds: Normal breath sounds. No wheezing, rhonchi or rales.  Chest:     Chest wall: No tenderness.  Abdominal:     General: Abdomen is flat. Bowel sounds are normal. There is no distension.     Palpations: Abdomen is soft.     Tenderness: There is abdominal tenderness in the right lower quadrant and suprapubic area. There is right CVA tenderness. There is no left CVA tenderness, guarding or rebound.  Musculoskeletal:        General: No swelling.     Cervical back: Neck supple.  Skin:    General: Skin is warm and dry.     Capillary Refill: Capillary refill takes less than 2 seconds.  Neurological:     General: No focal deficit present.     Mental Status: She is alert.  Psychiatric:        Mood and Affect: Mood normal.     ED Results / Procedures / Treatments   Labs (all labs ordered are listed, but only abnormal results are displayed) Labs Reviewed  CBC - Abnormal; Notable for the following components:      Result Value   WBC 10.7 (*)    All other components within normal limits  URINALYSIS, ROUTINE W REFLEX MICROSCOPIC  BASIC METABOLIC PANEL  LIPASE, BLOOD    EKG None  Radiology US PELVIC COMPLETE W TRANSVAGINAL AND TORSION R/O  Result Date: 06/29/2023 CLINICAL DATA:  Right lower quadrant pain EXAM: TRANSABDOMINAL AND TRANSVAGINAL ULTRASOUND OF PELVIS DOPPLER ULTRASOUND OF OVARIES TECHNIQUE: Both transabdominal and transvaginal ultrasound examinations of the  pelvis were performed. Transabdominal technique was performed for global imaging of the pelvis including uterus, ovaries, adnexal regions, and pelvic cul-de-sac. It was necessary to proceed with endovaginal exam following the transabdominal exam to visualize the ovaries. Color and duplex Doppler ultrasound was utilized to evaluate blood flow to the ovaries. COMPARISON:  CT from earlier in the same day. FINDINGS: Uterus Surgically removed Right ovary Measurements: 2.5 x 2.0 x 2.4 cm. = volume: 6.2 mL. Normal appearance/no adnexal mass. Left ovary Not well visualized. Pulsed Doppler evaluation of the right ovary demonstrates normal low-resistance arterial and venous waveforms. Other findings No abnormal free fluid. IMPRESSION: Nonvisualization of left ovary. Right ovary appears within normal limits. Electronically Signed   By: Alcide Clever M.D.   On: 06/29/2023 19:44   CT ABDOMEN PELVIS W CONTRAST  Result Date: 06/29/2023 CLINICAL DATA:  Right lower quadrant abdominal pain EXAM: CT ABDOMEN AND PELVIS WITH CONTRAST TECHNIQUE: Multidetector CT imaging of the abdomen and pelvis was performed using the standard protocol following bolus  administration of intravenous contrast. RADIATION DOSE REDUCTION: This exam was performed according to the departmental dose-optimization program which includes automated exposure control, adjustment of the mA and/or kV according to patient size and/or use of iterative reconstruction technique. CONTRAST:  OMNIPAQUE IOHEXOL 300 MG/ML  SOLN COMPARISON:  06/15/2023 FINDINGS: Lower chest: Suspected fluid-filled distal esophagus, suspicious for reflux. Hepatobiliary: Cholecystectomy.  Otherwise unremarkable. Pancreas: Unremarkable Spleen: Unremarkable Adrenals/Urinary Tract: 1.1 cm simple cyst of the right mid kidney, internal density 9 Hounsfield units. No further imaging workup of this lesion is indicated. Adrenal glands appear normal.  Urinary bladder unremarkable. Stomach/Bowel:  Wall thickening in the sigmoid colon is likely most attributable to nondistention, colitis seems less likely given the lack of associated mesenteric stranding. Normal appendix. No dilated bowel. Vascular/Lymphatic: Unremarkable Reproductive: Uterus absent.  Adnexa unremarkable. Other: No supplemental non-categorized findings. Musculoskeletal: Unremarkable IMPRESSION: 1. Wall thickening in the sigmoid colon is likely most attributable to nondistention, colitis seems less likely given the lack of associated mesenteric stranding. 2. Suspected fluid-filled distal esophagus, likely from reflux. 3. Normal appendix. Electronically Signed   By: Gaylyn Rong M.D.   On: 06/29/2023 19:14    Procedures Procedures    Medications Ordered in ED Medications  promethazine (PHENERGAN) 25 MG/ML injection (  Not Given 06/29/23 1842)  HYDROmorphone (DILAUDID) injection 1 mg (1 mg Intravenous Given 06/29/23 1804)  LORazepam (ATIVAN) injection 1 mg (1 mg Intravenous Given 06/29/23 1805)  promethazine (PHENERGAN) 25 mg in sodium chloride 0.9 % 50 mL IVPB (0 mg Intravenous Stopped 06/29/23 1830)  sodium chloride 0.9 % bolus 1,000 mL (0 mLs Intravenous Stopped 06/29/23 1909)  iohexol (OMNIPAQUE) 300 MG/ML solution 100 mL (100 mLs Intravenous Contrast Given 06/29/23 1836)  ketamine (KETALAR) injection 30 mg (30 mg Intravenous Given 06/29/23 1932)    ED Course/ Medical Decision Making/ A&P                                 Medical Decision Making Amount and/or Complexity of Data Reviewed Labs: ordered. Radiology: ordered.  Risk Prescription drug management.    Lynzie Sark is a 47 y.o. female with a past medical history significant for fibromyalgia, bipolar disorder, chronic abdominal pain, previous endometriosis, verbal report of previous ovarian torsion, previous abdominal surgery, GERD, abdominal wall hernia status post repair, rheumatoid arthritis, previous ovarian cyst, small bowel obstruction, and  bipolar disorder who presents with severe pain in her right flank and abdomen.  According to patient, she was driving yesterday and had sudden onset of sharp pain in her right flank.  She reports it radiated towards her right lower quadrant and lasted several minutes for improving.  She reports overnight she started having it on and off but then this morning at 10:30 AM it worsened rapidly.  She reports it is 10 out of 10 in severity in her right lower quadrant primarily.  She has had nausea and an episode of vomiting.  She reports chronic diarrhea with no change.  Denies any constipation.  Denies any urinary symptoms with no dysuria or hematuria.  Denies any vaginal symptoms.  She reports she was at her OB/GYN today and the pain remained severe.  She reports she was try to get a pelvic ultrasound and they could not see her ovary "so they stopped" and she came here for evaluation.  She reports this feels different than the chronic pain she has had over the last few months and  this feels different and sharp.  She does have a family history of kidney stones but has never had one herself.  Denies any hematuria.  Reports no fevers, chills, congestion, cough.  No chest pain or shortness of breath.  On exam, patient is quite tender in her right lower abdomen and right flank.  Right CVA also slightly tender.  No rash to suggest shingles.  Bowel sounds were appreciated.  Chest nontender and no murmur.  Good pulses in extremities.  Patient uncomfortable.  We had a shared decision-making conversation offering pelvic exam however she would prefer to defer it.  She is however amenable to getting a repeat pelvic ultrasound to look for and rule out ovarian torsion.  With the right lower quadrant tenderness we had agreed to get a CT scan to rule out acute appendicitis or something like a diverticulitis on the right side or kidney stone.  Patient had CT scan several weeks ago but this pain is new and different she  reports.  Will give her some IV fluids and give an IV dose of pain medicine, nausea medicine, and anxiety medicine.  We discussed that with chronic symptoms we would likely not be sending her home with prescriptions of medications.  Anticipate reassessment after workup.  CT scan did not show acute diverticulitis on the right side, acute appendicitis, or kidney stone.  Was not convincing for colitis either.  Ultrasound did not show right ovarian torsion and showed normal right ovary.  Labs were overall similar to prior.  We went through all the findings and do not feel she has a an acute surgical problem or need for admission at this time.  She is scheduled to see a new GI doctor tomorrow and we recommended going to that appointment.  As we did not find a clear concerning finding today, did not feel she was appropriate for new outpatient narcotic prescription or further parenteral pain medicine tonight.  Patient will be discharged for outpatient follow-up.         Final Clinical Impression(s) / ED Diagnoses Final diagnoses:  RLQ abdominal pain    Rx / DC Orders ED Discharge Orders     None      Clinical Impression: 1. RLQ abdominal pain     Disposition: Discharge  Condition: Good  I have discussed the results, Dx and Tx plan with the pt(& family if present). He/she/they expressed understanding and agree(s) with the plan. Discharge instructions discussed at great length. Strict return precautions discussed and pt &/or family have verbalized understanding of the instructions. No further questions at time of discharge.    Discharge Medication List as of 06/29/2023 10:39 PM      Follow Up: your GI team tomorrow and PCP         Refugia Laneve, Canary Brim, MD 06/29/23 [phone]

## 2023-06-29 NOTE — ED Notes (Signed)
Difficult stick in triage. Unable to place PIV at this time.

## 2023-06-29 NOTE — ED Triage Notes (Signed)
Right flank and RLQ pain x24hrs. Some dysuria. Still has appendix. Rebound tenderness. +N/-V. Afebrile.

## 2023-06-29 NOTE — ED Notes (Signed)
Patient verbalizes understanding of discharge instructions. Opportunity for questioning and answers were provided. Armband removed by staff, pt discharged from ED. Ambulated out to lobby  

## 2023-06-29 NOTE — ED Notes (Signed)
Report given to the next RN... 

## 2023-06-29 NOTE — ED Notes (Signed)
Assisting primary Paramedic; provided pt medication for pain control. Pt tolerated medication administration well; VSS. Family at bedside and call light in reach.

## 2023-06-29 NOTE — Discharge Instructions (Signed)
Your history, exam, and workup today did not reveal an acute surgical or emergent problem that would require admission at this time.  The CT scan did not show evidence of diverticulitis, appendicitis, cholecystitis, or other evident cause of the right-sided abdominal discomfort.  The ultrasound not show torsion or problem with your right ovary.  Your labs are similar to prior.  We feel you are safe for discharge home so please go to the appointment with your GI team tomorrow to discuss further management.  Please rest and stay hydrated as best you can.

## 2023-07-29 ENCOUNTER — Emergency Department (HOSPITAL_BASED_OUTPATIENT_CLINIC_OR_DEPARTMENT_OTHER): Payer: Medicare Other

## 2023-07-29 ENCOUNTER — Emergency Department (HOSPITAL_BASED_OUTPATIENT_CLINIC_OR_DEPARTMENT_OTHER)
Admission: EM | Admit: 2023-07-29 | Discharge: 2023-07-29 | Disposition: A | Discharge status: Home / Self Care | Payer: Medicare Other | Attending: Emergency Medicine | Admitting: Emergency Medicine

## 2023-07-29 ENCOUNTER — Other Ambulatory Visit: Payer: Self-pay

## 2023-07-29 ENCOUNTER — Encounter (HOSPITAL_BASED_OUTPATIENT_CLINIC_OR_DEPARTMENT_OTHER): Payer: Self-pay

## 2023-07-29 DIAGNOSIS — R202 Paresthesia of skin: Secondary | ICD-10-CM | POA: Insufficient documentation

## 2023-07-29 DIAGNOSIS — G8929 Other chronic pain: Secondary | ICD-10-CM | POA: Diagnosis not present

## 2023-07-29 DIAGNOSIS — M545 Low back pain, unspecified: Secondary | ICD-10-CM | POA: Diagnosis present

## 2023-07-29 DIAGNOSIS — R531 Weakness: Secondary | ICD-10-CM | POA: Insufficient documentation

## 2023-07-29 LAB — BASIC METABOLIC PANEL
Anion gap: 8 (ref 5–15)
BUN: 12 mg/dL (ref 6–20)
CO2: 27 mmol/L (ref 22–32)
Calcium: 9 mg/dL (ref 8.9–10.3)
Chloride: 101 mmol/L (ref 98–111)
Creatinine, Ser: 0.57 mg/dL (ref 0.44–1.00)
GFR, Estimated: >60 mL/min (ref >60)
Glucose, Bld: 82 mg/dL (ref 70–99)
Potassium: 4.6 mmol/L (ref 3.5–5.1)
Sodium: 136 mmol/L (ref 135–145)

## 2023-07-29 LAB — CBC
HCT: 41.6 % (ref 36.0–46.0)
Hemoglobin: 14.2 g/dL (ref 12.0–15.0)
MCH: 31.9 pg (ref 26.0–34.0)
MCHC: 34.1 g/dL (ref 30.0–36.0)
MCV: 93.5 fL (ref 80.0–100.0)
Platelets: 227 10*3/uL (ref 150–400)
RBC: 4.45 MIL/uL (ref 3.87–5.11)
RDW: 12.5 % (ref 11.5–15.5)
WBC: 7.8 10*3/uL (ref 4.0–10.5)
nRBC: 0 % (ref 0.0–0.2)

## 2023-07-29 MED ORDER — FENTANYL CITRATE PF 50 MCG/ML IJ SOSY
50.0000 ug | PREFILLED_SYRINGE | Freq: Once | INTRAMUSCULAR | Status: AC
  Administered 2023-07-29: 50 ug via INTRAVENOUS
  Filled 2023-07-29: qty 1

## 2023-07-29 MED ORDER — DIPHENHYDRAMINE HCL 50 MG/ML IJ SOLN
50.0000 mg | Freq: Once | INTRAMUSCULAR | Status: AC
  Administered 2023-07-29: 50 mg via INTRAVENOUS
  Filled 2023-07-29: qty 1

## 2023-07-29 MED ORDER — SODIUM CHLORIDE 0.9 % IV SOLN
25.0000 mg | Freq: Four times a day (QID) | INTRAVENOUS | Status: DC | PRN
  Administered 2023-07-29: 25 mg via INTRAVENOUS
  Filled 2023-07-29: qty 1

## 2023-07-29 MED ORDER — OXYCODONE HCL 5 MG PO TABS: 5.0000 mg | ORAL_TABLET | Freq: Four times a day (QID) | ORAL | 0 refills | Status: AC | PRN

## 2023-07-29 MED ORDER — HYDROMORPHONE HCL 1 MG/ML IJ SOLN
0.5000 mg | Freq: Once | INTRAMUSCULAR | Status: DC
  Filled 2023-07-29: qty 1

## 2023-07-29 MED ORDER — PROMETHAZINE HCL 25 MG/ML IJ SOLN
INTRAMUSCULAR | Status: AC
  Filled 2023-07-29: qty 1

## 2023-07-29 MED ORDER — LORAZEPAM 2 MG/ML IJ SOLN
2.0000 mg | Freq: Once | INTRAMUSCULAR | Status: AC
  Administered 2023-07-29: 2 mg via INTRAVENOUS
  Filled 2023-07-29: qty 1

## 2023-07-29 MED ORDER — HYDROMORPHONE HCL 1 MG/ML IJ SOLN
0.5000 mg | Freq: Once | INTRAMUSCULAR | Status: AC
  Administered 2023-07-29: 0.5 mg via INTRAVENOUS

## 2023-07-29 MED ORDER — GADOBUTROL 1 MMOL/ML IV SOLN
10.0000 mL | Freq: Once | INTRAVENOUS | Status: AC | PRN
  Administered 2023-07-29: 10 mL via INTRAVENOUS

## 2023-07-29 NOTE — ED Notes (Signed)
Pt requesting Ketamine for pain after MRI. Pt stated she knows Ketamine works for pain and she has a long list of allergies. PA notified.

## 2023-07-29 NOTE — ED Triage Notes (Addendum)
Pt reports she was brushing her teeth when she had her legs go out from under her with sharp pains in her back. Pt went to PT and was referred to the ED. Denies urinary or fecal incontinence. Pt has taken Tylenol and Flexeril this AM.

## 2023-07-29 NOTE — ED Notes (Signed)
Pt states that IV is burning, requesting phenergan be DC. This RN stopped phenergan, advised EDP. EDP at bedside to assess/ discuss plan

## 2023-07-29 NOTE — ED Notes (Signed)
Pt stating she feels she is unable to and having difficulty urinating at this time. Pt noted to have urinated earlier with no issue. EDP notified.

## 2023-07-29 NOTE — ED Notes (Signed)
Assess PIV at patients request.  States feels sore.  No redness or swelling. Flushes well with no infiltration and has blood return.  Offer to replace.  Patient states will keep present PIV.  Will continue to monitor.

## 2023-07-29 NOTE — ED Provider Notes (Signed)
[place] EMERGENCY DEPARTMENT AT [facility] Provider Note   CSN: [id number] Arrival date & time: 07/29/23  1409     History  Chief Complaint  Patient presents with   Back Pain    Gina [name] is a 47 y.o. female with a history of chronic back pain, bipolar disorder, and anxiety who presents the ED today for an acute exacerbation of chronic back pain.  Patient reports that she regularly goes to PT and has improved her back pain over the past year or so, but for the past month she has been having acute low back pain.  She states that she was brushing her teeth the other day when she developed sharp pains in her low back and numbness in her legs.  Her PT provider advised patient to go to the ED and get an MRI for further evaluation.  Patient denies urinary or fecal incontinence but reports that it is difficult to start urinating but she is able to do it.  She reports numbness and weakness to her lower extremities bilaterally and pain across her lower back.  Denies fever or IVDU.  No other complaints or concerns at this time.    Home Medications Prior to Admission medications   Medication Sig Start Date End Date Taking? Authorizing Provider  oxyCODONE (ROXICODONE) 5 MG immediate release tablet Take 1 tablet (5 mg total) by mouth every 6 (six) hours as needed for up to 12 days for severe pain. 07/29/23 08/10/23 Yes Maxwell Marion, PA-C  azithromycin (ZITHROMAX Z-PAK) 250 MG tablet Take as directed 01/25/23   Sater, Pearletha Furl, MD  diazepam (VALIUM) 5 MG tablet Take 1 tab at onset of migraine.  May repeat in 2 hrs, if needed.  Max dose: 2 tabs/day. This is a 30 day prescription. 01/18/23   Levert Feinstein, MD  escitalopram (LEXAPRO) 20 MG tablet Take 30 mg by mouth at bedtime. 12/20/18   [provider]  LORazepam (ATIVAN) 1 MG tablet Take 1 tablet (1 mg total) by mouth every 8 (eight) hours as needed for anxiety. 09/14/22 09/14/23  [place], Alvira Philips, DO  Lurasidone HCl 60 MG TABS  Take 60 mg by mouth daily. 12/16/22   [provider]  oxyCODONE-acetaminophen (PERCOCET) 7.5-325 MG tablet Take 1 tablet by mouth every 6 (six) hours. 12/02/22   [provider]  promethazine (PHENERGAN) 25 MG tablet Take 1 tablet (25 mg total) by mouth every 6 (six) hours as needed for up to 15 doses for nausea or vomiting. 04/14/23   Terald Sleeper, MD  rizatriptan (MAXALT-MLT) 10 MG disintegrating tablet Take 1 tablet (10 mg total) by mouth as needed. May repeat in 2 hours if needed 01/18/23   Levert Feinstein, MD      Allergies    Lamictal [lamotrigine], Morphine and codeine, Nicotine, Zofran, Chantix [varenicline tartrate], Ciprofloxacin, Codeine, Haldol [haloperidol], Ibuprofen, Lidoderm [lidocaine], Metaxalone, Sulfa antibiotics, Sulfasalazine, Toradol [ketorolac tromethamine], Duloxetine, Duloxetine hcl, Gabapentin, Ingrezza [valbenazine tosylate], Methadone, Pregabalin, Zolpidem, Zolpidem tartrate, Amoxicillin, Aripiprazole, Penicillins, Tramadol, and Valbenazine    Review of Systems   Review of Systems  Musculoskeletal:  Positive for back pain.  All other systems reviewed and are negative.   Physical Exam Updated Vital Signs BP 97/69   Pulse 94   Temp 98.9 F (37.2 C) (Oral)   Resp 18   LMP 01/01/2004   SpO2 96%  Physical Exam Vitals and nursing note reviewed.  Constitutional:      Appearance: Normal appearance.  HENT:  Head: Normocephalic and atraumatic.     Mouth/Throat:     Mouth: Mucous membranes are moist.  Eyes:     Conjunctiva/sclera: Conjunctivae normal.     Pupils: Pupils are equal, round, and reactive to light.  Cardiovascular:     Rate and Rhythm: Normal rate and regular rhythm.     Pulses: Normal pulses.     Heart sounds: Normal heart sounds.  Pulmonary:     Effort: Pulmonary effort is normal.     Breath sounds: Normal breath sounds.  Abdominal:     Palpations: Abdomen is soft.     Tenderness: There is no abdominal tenderness.   Musculoskeletal:        General: Tenderness present.     Comments: Tenderness across the lower back without focal midline tenderness.  No step-off or deformity appreciated at the cervical, thoracic, or lumbar spine.  Skin:    General: Skin is warm and dry.     Findings: No rash.  Neurological:     General: No focal deficit present.     Mental Status: She is alert.     Sensory: Sensory deficit present.     Motor: No weakness.     Deep Tendon Reflexes: Reflexes normal.     Comments: Decreased sensation to bilateral lower extremities.  Patellar reflexes appreciated bilaterally.  Psychiatric:        Mood and Affect: Mood normal.        Behavior: Behavior normal.     ED Results / Procedures / Treatments   Labs (all labs ordered are listed, but only abnormal results are displayed) Labs Reviewed  CBC  BASIC METABOLIC PANEL    EKG None  Radiology MR Lumbar Spine W Wo Contrast  Result Date: 07/29/2023 CLINICAL DATA:  Low back pain, prior surgery, new symptoms. Ongoing low back pain for 2 months. No known injury. No prior surgery to area. EXAM: MRI LUMBAR SPINE WITHOUT AND WITH CONTRAST TECHNIQUE: Multiplanar and multiecho pulse sequences of the lumbar spine were obtained without and with intravenous contrast. CONTRAST:  10mL GADAVIST GADOBUTROL 1 MMOL/ML IV SOLN COMPARISON:  Lumbar MRI 04/02/2018. CT of the abdomen and pelvis 06/15/2023. Lumbar spine radiographs 09/18/2018. FINDINGS: Segmentation: Conventional anatomy assumed, with the last open disc space designated L5-S1.Concordant with prior imaging. Alignment: Mild convex right scoliosis. No focal angulation or listhesis. Vertebrae: No worrisome osseous lesion, acute fracture or pars defect. Chronic Schmorl's node in the inferior endplate of L1. No evidence of discitis or osteomyelitis. No obvious postsurgical changes in the lumbar spine. Conus medullaris: Extends to the L1-2 level and appears normal. No abnormal intradural enhancement.  Paraspinal and other soft tissues: No significant paraspinal findings. Disc levels: Sagittal images demonstrate no significant disc space findings within the visualized lower thoracic spine. L1-2: Stable loss of disc height with disc bulging and endplate osteophytes. No spinal stenosis or nerve root encroachment. L2-3: Normal interspace. L3-4: Normal interspace. L4-5: Normal interspace. L5-S1: Normal interspace. IMPRESSION: 1. No acute findings or explanation for the patient's symptoms. 2. Stable spondylosis at L1-2 without resulting spinal stenosis or nerve root encroachment. Electronically Signed   By: Carey Bullocks M.D.   On: 07/29/2023 18:36    Procedures Procedures: not indicated.   Medications Ordered in ED Medications  promethazine (PHENERGAN) 25 mg in sodium chloride 0.9 % 50 mL IVPB (0 mg Intravenous Stopped 07/29/23 1954)  promethazine (PHENERGAN) 25 MG/ML injection (  Not Given 07/29/23 1936)  fentaNYL (SUBLIMAZE) injection 50 mcg (50 mcg Intravenous Given 07/29/23  1556)  LORazepam (ATIVAN) injection 2 mg (2 mg Intravenous Given 07/29/23 1616)  diphenhydrAMINE (BENADRYL) injection 50 mg (50 mg Intravenous Given 07/29/23 1641)  gadobutrol (GADAVIST) 1 MMOL/ML injection 10 mL (10 mLs Intravenous Contrast Given 07/29/23 1706)  fentaNYL (SUBLIMAZE) injection 50 mcg (50 mcg Intravenous Given 07/29/23 1758)  HYDROmorphone (DILAUDID) injection 0.5 mg (0.5 mg Intravenous Given 07/29/23 1931)    ED Course/ Medical Decision Making/ A&P                                 Medical Decision Making Amount and/or Complexity of Data Reviewed Labs: ordered. Radiology: ordered.  Risk Prescription drug management.   This patient presents to the ED for concern of low back pain, this involves an extensive number of treatment options, and is a complaint that carries with it a high risk of complications and morbidity.   Differential diagnosis includes: Cauda equina syndrome, epidural abscess, fracture,  dislocation, nerve impingement, sciatica, stenosis, radiculopathy, etc.   Comorbidities  See HPI above   Additional History  Additional history obtained from patient's previous records.   Lab Tests  I ordered and personally interpreted labs.  The pertinent results include:   BMP and CBC are unremarkable no electrolyte abnormality, AKI, acute infection, or anemia.   Imaging Studies  I ordered imaging studies including Lumbar MRI  I independently visualized and interpreted imaging which showed: No acute findings.  Stable spondylosis at L1-2 without resulting spinal stenosis or nerve root encroachment. I agree with the radiologist interpretation   Problem List / ED Course / Critical Interventions / Medication Management  Acute exacerbation of lower back pain I ordered medications including: Fentanyl x2 for pain Ativan and Benadryl for anxiety prior to MRI Dilaudid and Phenergan for pain and nausea prior to discharge Reevaluation of the patient after these medicines showed that the patient stayed the same I have reviewed the patients home medicines and have made adjustments as needed. Patient reported difficulty with starting urination in the ED, but she was able to with concentration. I told patient that if she is having difficulty with urination, we need to get a urine sample to rule out infection.  Patient stated that she had urinary tract infections in the past and that this did not feel like one.  She denied U/A at this time.   Social Determinants of Health  Physical activity   Test / Admission - Considered  Discussed findings with patient. She is hemodynamically stable and safe for discharge home. Information for orthopedics provided for close follow-up. Return precautions provided.       Final Clinical Impression(s) / ED Diagnoses Final diagnoses:  Acute exacerbation of chronic low back pain    Rx / DC Orders ED Discharge Orders          Ordered     oxyCODONE (ROXICODONE) 5 MG immediate release tablet  Every 6 hours PRN        07/29/23 1900              Maxwell Marion, PA-C 07/29/23 2309    Alvira Monday, MD 07/30/23 1353

## 2023-07-29 NOTE — Discharge Instructions (Addendum)
As discussed, your imaging does not show any acute findings.  It shows stable spondylosis at L1-2 without any resulting spinal stenosis or nerve root encroachment.  Alternate between Tylenol and Advil every 4 hours for pain.  I have sent several days worth of pain medication to your pharmacy.  You can take 1 tablet every 6 hours as needed for breakthrough pain.   I have provided information for orthopedics.  Please call the office to schedule an appointment for reevaluation of your symptoms.    Get help right away if: You develop new bowel or bladder control problems. You have unusual weakness in your arms or legs. You feel faint.

## 2023-08-08 ENCOUNTER — Ambulatory Visit: Payer: Medicare Other | Admitting: Physical Medicine and Rehabilitation

## 2023-08-08 ENCOUNTER — Encounter: Payer: Self-pay | Admitting: Physical Medicine and Rehabilitation

## 2023-08-08 DIAGNOSIS — G894 Chronic pain syndrome: Secondary | ICD-10-CM

## 2023-08-08 DIAGNOSIS — M7918 Myalgia, other site: Secondary | ICD-10-CM | POA: Diagnosis not present

## 2023-08-08 DIAGNOSIS — M5416 Radiculopathy, lumbar region: Secondary | ICD-10-CM | POA: Diagnosis not present

## 2023-08-08 NOTE — Progress Notes (Unsigned)
Gina [name] - 47 y.o. female MRN [id number]  Date of birth: 1976/04/16  Office Visit Note: Visit Date: 08/08/2023 PCP: Charolett Bumpers, PA-C Referred by: Charolett Bumpers, PA-C  Subjective: Chief Complaint  Patient presents with   Lower Back - Pain   HPI: Gina [name] is a 47 y.o. female who comes in today as a self referral for evaluation of chronic bilateral lower back pain radiating to buttocks down bilateral legs. Pain ongoing for over a year, worsened over the last several months. She also reports numbness/tingling to bilateral lower extremities. She was recently seen in the emergency department for chronic lower back pain. Her pain becomes severe with standing and walking, describes as stabbing and electrical sensation, currently rates as 10 out of 10. She has tried home exercise regimen, rest and use of medications. History of formal physical therapy with no relief of pain. Recent lumbar MRI imaging exhibits stable spondylosis at L1-2 without resulting spinal stenosis or nerve root encroachment. She was recently evaluated by Dr[facility] Charyl Bigger with [facility] for chronic abdominal pain, Dr. Cherrie Distance recently started her on Belbuca, however she stopped taking as she was unable to tolerate this medication. Per his notes there is a plan to perform bilateral splanchnic block, she has not scheduled follow up at this time. Of note, patient has 27 allergies/intolerances to medications listed. Patient denies focal weakness, no recent trauma or falls, no bowel/bladder incontinence.     Oswestry Disability Index Score 44% 20 to 30 (60%) severe disability: Pain remains the main problem in this group but activities of daily living are affected. These patients require a detailed investigation.  Review of Systems  Musculoskeletal:  Positive for back pain and myalgias.  Neurological:  Positive for tingling. Negative for focal weakness and weakness.  All other systems  reviewed and are negative.  Otherwise per HPI.  Assessment & Plan: Visit Diagnoses:    ICD-10-CM   1. Lumbar radiculopathy  M54.16     2. Myofascial pain syndrome  M79.18     3. Chronic pain syndrome  G89.4        Plan: Findings:  Chronic, worsening and severe bilateral lower back pain radiating down to bilateral lower extremities.  Patient continues to have severe pain despite good conservative therapy such as formal physical therapy, home exercise regimen, rest and use of medications.  Recent lumbar MRI imaging looks fairly normal, no frank nerve impingement, no high-grade spinal canal stenosis.  Patient's clinical presentation and exam are complex, her symptoms do not directly correlate with recent lumbar MRI imaging.  I do think her symptoms could be stemming from some type of chronic pain/central sensitization syndrome such as fibromyalgia.  She does have diffuse tenderness noted to lumbar paraspinal region and bilateral lower extremities on exam today.  We discussed treatment plan in detail today, would not recommend performing interventional spine procedures at this time.  I do feel she would benefit from a more comprehensive pain management approach and encouraged her to follow-up with Dr. Cherrie Distance for further management.  Patient has no questions at this time, we are happy to see her back if lumbar injections are warranted.  No red flag symptoms noted upon exam today.    Meds & Orders: No orders of the defined types were placed in this encounter.  No orders of the defined types were placed in this encounter.   Follow-up: Return if symptoms worsen or fail to improve.   Procedures: No procedures performed  Clinical History: Narrative & Impression CLINICAL DATA:  Low back pain, prior surgery, new symptoms. Ongoing low back pain for 2 months. No known injury. No prior surgery to area.   EXAM: MRI LUMBAR SPINE WITHOUT AND WITH CONTRAST   TECHNIQUE: Multiplanar and multiecho  pulse sequences of the lumbar spine were obtained without and with intravenous contrast.   CONTRAST:  10mL GADAVIST GADOBUTROL 1 MMOL/ML IV SOLN   COMPARISON:  Lumbar MRI 04/02/2018. CT of the abdomen and pelvis 06/15/2023. Lumbar spine radiographs 09/18/2018.   FINDINGS: Segmentation: Conventional anatomy assumed, with the last open disc space designated L5-S1.Concordant with prior imaging.   Alignment: Mild convex right scoliosis. No focal angulation or listhesis.   Vertebrae: No worrisome osseous lesion, acute fracture or pars defect. Chronic Schmorl's node in the inferior endplate of L1. No evidence of discitis or osteomyelitis. No obvious postsurgical changes in the lumbar spine.   Conus medullaris: Extends to the L1-2 level and appears normal. No abnormal intradural enhancement.   Paraspinal and other soft tissues: No significant paraspinal findings.   Disc levels:   Sagittal images demonstrate no significant disc space findings within the visualized lower thoracic spine.   L1-2: Stable loss of disc height with disc bulging and endplate osteophytes. No spinal stenosis or nerve root encroachment.   L2-3: Normal interspace.   L3-4: Normal interspace.   L4-5: Normal interspace.   L5-S1: Normal interspace.   IMPRESSION: 1. No acute findings or explanation for the patient's symptoms. 2. Stable spondylosis at L1-2 without resulting spinal stenosis or nerve root encroachment.     Electronically Signed   By: Carey Bullocks M.D.   On: 07/29/2023 18:36   She reports that she has been smoking cigarettes. She uses smokeless tobacco. No results for input(s): "HGBA1C", "LABURIC" in the last 8760 hours.  Objective:  VS:  HT:    WT:   BMI:     BP:   HR: bpm  TEMP: ( )  RESP:  Physical Exam Vitals and nursing note reviewed.  HENT:     Head: Normocephalic and atraumatic.     Right Ear: External ear normal.     Left Ear: External ear normal.     Nose: Nose  normal.     Mouth/Throat:     Mouth: Mucous membranes are moist.  Eyes:     Pupils: Pupils are equal, round, and reactive to light.  Cardiovascular:     Rate and Rhythm: Normal rate.     Pulses: Normal pulses.  Pulmonary:     Effort: Pulmonary effort is normal.  Abdominal:     General: Abdomen is flat. There is no distension.  Musculoskeletal:        General: Tenderness present.     Cervical back: Normal range of motion.     Comments: Patient rises from seated position to standing without difficulty. Good lumbar range of motion. No pain noted with facet loading. 5/5 strength noted with bilateral hip flexion, knee flexion/extension, ankle dorsiflexion/plantarflexion and EHL. No clonus noted bilaterally. No pain upon palpation of greater trochanters. No pain with internal/external rotation of bilateral hips. Sensation intact bilaterally. Tenderness noted to bilateral lumbar paraspinal regions. Negative slump test bilaterally. Ambulates without aid, gait steady.     Skin:    General: Skin is warm and dry.     Capillary Refill: Capillary refill takes less than 2 seconds.  Neurological:     General: No focal deficit present.     Mental Status: She is alert  and oriented to person, place, and time.  Psychiatric:        Mood and Affect: Mood normal.        Behavior: Behavior normal.     Ortho Exam  Imaging: No results found.  Past Medical/Family/Surgical/Social History: Medications & Allergies reviewed per EMR, new medications updated. Patient Active Problem List   Diagnosis Date Noted   Persistent headaches 01/18/2023   Blurry vision 01/18/2023   Weight gain 01/18/2023   SBO (small bowel obstruction) (HCC) 09/13/2022   Nausea and vomiting 09/11/2022   Partial small bowel obstruction (HCC) 09/10/2022   Piriformis syndrome of both sides 05/31/2019   History of bipolar disorder 04/18/2019   Tardive dyskinesia 04/18/2019   Urticaria due to drug allergy 04/18/2019   Lumbar  spondylosis 02/05/2019   Bipolar affective disorder, current episode mixed (HCC) 01/02/2019   Arthritis 01/02/2019   Cigarette nicotine dependence without complication 01/02/2019   Bilateral temporomandibular joint pain 10/02/2018   Referred otalgia, bilateral 10/02/2018   Paresthesias 03/09/2018   Chronic migraine without aura, intractable, with status migrainosus 04/22/2016   Bipolar 1 disorder, mixed, moderate (HCC) 07/15/2015    Class: Chronic   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   Sciatica 01/09/2015   Reactive hypoglycemia 12/17/2013   Musculoskeletal malfunction arising from mental factors 04/04/2013   Incisional hernia 11/15/2012   Other postprocedural status(V45.89) 11/15/2012   S/P hernia surgery 11/15/2012   Abdominal pain 11/11/2012   Chronic pain syndrome 11/11/2012   Disorder of sacrum 11/11/2012   Chronic pain 12/21/2011   Heartburn 10/04/2011   DYSPHAGIA 10/04/2011   Depression with anxiety 09/15/2010   CONSTIPATION 09/15/2010   LUNG NODULE 06/17/2010   Herpes simplex virus (HSV) infection 06/11/2010   FATIGUE 06/11/2010   STRICTURE AND STENOSIS OF ESOPHAGUS 05/13/2010   ABDOMINAL WALL HERNIA 02/27/2010   ENDOMETRIOSIS 12/16/2009   GERD 10/23/2009   PALPITATIONS 10/23/2009   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   DYSTHYMIC DISORDER 06/20/2009   CONDYLOMA ACUMINATUM 04/23/2009   LENTIGO 04/23/2009   DENTAL PAIN 03/19/2009   CERVICAL RADICULOPATHY 12/11/2008   Cervical radiculopathy 12/11/2008   PANIC DISORDER WITHOUT AGORAPHOBIA 08/07/2008   TOBACCO ABUSE 08/07/2008   BACK PAIN, THORACIC REGION 07/07/2007   ALLERGIC RHINITIS 06/02/2007   Past Medical History:  Diagnosis Date   ABDOMINAL WALL HERNIA 02/27/2010   Abnormal Pap smear    ALLERGIC RHINITIS 06/02/2007   ANOREXIA, CHRONIC 08/07/2008   ANXIETY 09/15/2010   Arthritis    BACK PAIN, THORACIC REGION 07/07/2007   Bipolar disorder (HCC)    CERVICAL RADICULOPATHY 12/11/2008    Condyloma acuminatum 04/23/2009   CONSTIPATION 09/15/2010   DYSPHAGIA UNSPECIFIED 09/09/2009   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   Eating disorder    ENDOMETRIOSIS 12/16/2009   FATIGUE 06/11/2010   Fibromyalgia    GERD 10/23/2009   Heart palpitations    HERPES SIMPLEX INFECTION 06/11/2010   LENTIGO 04/23/2009   LUNG NODULE 06/17/2010   Narcotic abuse, continuous (HCC)    Ovarian cyst    Palpitations 10/23/2009   Panic disorder    Renal disorder    Rheumatoid arthritis(714.0)    Stricture and stenosis of esophagus 05/13/2010   TOBACCO ABUSE 08/07/2008   Urinary tract infection    UTI (urinary tract infection)    Family History  Problem Relation Age of Onset   Depression Mother    Arthritis Mother    Hyperlipidemia Father    Hypertension Father    Past Surgical History:  Procedure Laterality Date  ABDOMINAL SURGERY     ABDOMINAL WALL MESH  REMOVAL     ABLATION ON ENDOMETRIOSIS     CESAREAN SECTION     x2    DILATION AND CURETTAGE OF UTERUS     x1    ENDOMETRIAL ABLATION     esophageal dilatation x 4     GUM SURGERY     HERNIA REPAIR     X3   PARTIAL HYSTERECTOMY     Social History   Occupational History   Not on file  Tobacco Use   Smoking status: Every Day    Current packs/day: 0.20    Types: Cigarettes   Smokeless tobacco: Current  Vaping Use   Vaping status: Every Day  Substance and Sexual Activity   Alcohol use: No   Drug use: No   Sexual activity: Yes    Birth control/protection: Surgical

## 2023-08-08 NOTE — Progress Notes (Unsigned)
Functional Pain Scale - descriptive words and definitions  Intense (8)    Cannot complete any ADLs without much assistance/cannot concentrate/conversation is difficult/unable to sleep and unable to use distraction. Severe range order  Average Pain 8  Lower back pain on both sides that radiates into the legs to the ankle

## 2023-08-17 ENCOUNTER — Ambulatory Visit (INDEPENDENT_AMBULATORY_CARE_PROVIDER_SITE_OTHER): Payer: Medicare Other | Admitting: Orthopedic Surgery

## 2023-08-17 DIAGNOSIS — M7531 Calcific tendinitis of right shoulder: Secondary | ICD-10-CM | POA: Diagnosis not present

## 2023-08-18 ENCOUNTER — Encounter: Payer: Self-pay | Admitting: Orthopedic Surgery

## 2023-08-18 MED ORDER — HYDROCODONE-ACETAMINOPHEN 5-325 MG PO TABS: 1.0000 | ORAL_TABLET | Freq: Two times a day (BID) | ORAL | 0 refills | Status: DC | PRN

## 2023-08-18 NOTE — Progress Notes (Signed)
Office Visit Note   Patient: Gina [name]           Date of Birth: 1976-10-10           MRN: [id number] Visit Date: 08/17/2023 Requested by: Gina Bumpers, PA-C [id number] [place],  [place] [address] PCP: Gina [name]  Subjective: Chief Complaint  Patient presents with   Right Shoulder - Pain    HPI: Gina [name] is a 47 y.o. female who presents to the office reporting right shoulder pain.  Patient had an injury in June 2024.  History of 2 cortisone injections which helped some.  Hard for her to lay on the right-hand side.  Reports decreased mobility in the shoulder.  Superior and posterior pain.  Radiographs do show a small focus of calcific tendinitis in the shoulder near the supraspinatus attachment.  Pain does radiate down her arm and occasionally to the wrist.  Has been taking muscle relaxers and oral steroids.  Cortisone did help the shoulder but the pain came back.  She works from home.  In physical therapy now.  Cannot take anti-inflammatories because of an allergy.  Her allergy listed is nausea and vomiting to ibuprofen.  [name] scheduled for surgery elsewhere this month but that [name] canceled and the details of that are not clear.  She has had an MRI scan done at [facility] and does not have the report or the CD..                ROS: All systems reviewed are negative as they relate to the chief complaint within the history of present illness.  Patient denies fevers or chills.  Assessment & Plan: Visit Diagnoses:  1. Calcific tendinitis of right shoulder     Plan: Impression is right shoulder pain with calcific tendinitis and possible AC joint arthritis.  Need more information regarding prior treatment of this problem.  One-time prescription for Norco written.  Release form signed.  Encouraged her to get all notes and the MRI scan from [facility] and once that has been done we can proceed with possible surgery  scheduling.  Follow-Up Instructions: No follow-ups on file.   Orders:  No orders of the defined types were placed in this encounter.  No orders of the defined types were placed in this encounter.     Procedures: No procedures performed   Clinical Data: No additional findings.  Objective: Vital Signs: LMP 01/01/2004   Physical Exam:  Constitutional: Patient appears well-developed HEENT:  Head: Normocephalic Eyes:EOM are normal Neck: Normal range of motion Cardiovascular: Normal rate Pulmonary/chest: Effort normal Neurologic: Patient is alert Skin: Skin is warm Psychiatric: Patient has normal mood and affect  Ortho Exam: Ortho exam demonstrates painful range of motion of the right shoulder passively and actively.  Does localize pain both to the [facility] joint and the deltoid region.  Rotator cuff strength feels pretty reasonable to external rotation strength testing as well as subscap testing.  No masses lymphadenopathy or skin changes noted in that shoulder girdle region.  No coarse grinding or crepitus with internal/external rotation of the shoulder patient does have positive O'Brien's testing.  Specialty Comments:  Narrative & Impression CLINICAL DATA:  Low back pain, prior surgery, new symptoms. Ongoing low back pain for 2 months. No known injury. No prior surgery to area.   EXAM: MRI LUMBAR SPINE WITHOUT AND WITH CONTRAST   TECHNIQUE: Multiplanar and multiecho pulse sequences of the lumbar spine were  obtained without and with intravenous contrast.   CONTRAST:  10mL GADAVIST GADOBUTROL 1 MMOL/ML IV SOLN   COMPARISON:  Lumbar MRI 04/02/2018. CT of the abdomen and pelvis 06/15/2023. Lumbar spine radiographs 09/18/2018.   FINDINGS: Segmentation: Conventional anatomy assumed, with the last open disc space designated L5-S1.Concordant with prior imaging.   Alignment: Mild convex right scoliosis. No focal angulation or listhesis.   Vertebrae: No worrisome osseous  lesion, acute fracture or pars defect. Chronic Schmorl's node in the inferior endplate of L1. No evidence of discitis or osteomyelitis. No obvious postsurgical changes in the lumbar spine.   Conus medullaris: Extends to the L1-2 level and appears normal. No abnormal intradural enhancement.   Paraspinal and other soft tissues: No significant paraspinal findings.   Disc levels:   Sagittal images demonstrate no significant disc space findings within the visualized lower thoracic spine.   L1-2: Stable loss of disc height with disc bulging and endplate osteophytes. No spinal stenosis or nerve root encroachment.   L2-3: Normal interspace.   L3-4: Normal interspace.   L4-5: Normal interspace.   L5-S1: Normal interspace.   IMPRESSION: 1. No acute findings or explanation for the patient's symptoms. 2. Stable spondylosis at L1-2 without resulting spinal stenosis or nerve root encroachment.     Electronically Signed   By: Carey Bullocks M.D.   On: 07/29/2023 18:36  Imaging: No results found.   PMFS History: Patient Active Problem List   Diagnosis Date Noted   Persistent headaches 01/18/2023   Blurry vision 01/18/2023   Weight gain 01/18/2023   SBO (small bowel obstruction) (HCC) 09/13/2022   Nausea and vomiting 09/11/2022   Partial small bowel obstruction (HCC) 09/10/2022   Piriformis syndrome of both sides 05/31/2019   History of bipolar disorder 04/18/2019   Tardive dyskinesia 04/18/2019   Urticaria due to drug allergy 04/18/2019   Lumbar spondylosis 02/05/2019   Bipolar affective disorder, current episode mixed (HCC) 01/02/2019   Arthritis 01/02/2019   Cigarette nicotine dependence without complication 01/02/2019   Bilateral temporomandibular joint pain 10/02/2018   Referred otalgia, bilateral 10/02/2018   Paresthesias 03/09/2018   Chronic migraine without aura, intractable, with status migrainosus 04/22/2016   Bipolar 1 disorder, mixed, moderate (HCC)  07/15/2015    Class: Chronic   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   Sciatica 01/09/2015   Reactive hypoglycemia 12/17/2013   Musculoskeletal malfunction arising from mental factors 04/04/2013   Incisional hernia 11/15/2012   Other postprocedural status(V45.89) 11/15/2012   S/P hernia surgery 11/15/2012   Abdominal pain 11/11/2012   Chronic pain syndrome 11/11/2012   Disorder of sacrum 11/11/2012   Chronic pain 12/21/2011   Heartburn 10/04/2011   DYSPHAGIA 10/04/2011   Depression with anxiety 09/15/2010   CONSTIPATION 09/15/2010   LUNG NODULE 06/17/2010   Herpes simplex virus (HSV) infection 06/11/2010   FATIGUE 06/11/2010   STRICTURE AND STENOSIS OF ESOPHAGUS 05/13/2010   ABDOMINAL WALL HERNIA 02/27/2010   ENDOMETRIOSIS 12/16/2009   GERD 10/23/2009   PALPITATIONS 10/23/2009   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   DYSTHYMIC DISORDER 06/20/2009   CONDYLOMA ACUMINATUM 04/23/2009   LENTIGO 04/23/2009   DENTAL PAIN 03/19/2009   CERVICAL RADICULOPATHY 12/11/2008   Cervical radiculopathy 12/11/2008   PANIC DISORDER WITHOUT AGORAPHOBIA 08/07/2008   TOBACCO ABUSE 08/07/2008   BACK PAIN, THORACIC REGION 07/07/2007   ALLERGIC RHINITIS 06/02/2007   Past Medical History:  Diagnosis Date   ABDOMINAL WALL HERNIA 02/27/2010   Abnormal Pap smear    ALLERGIC RHINITIS 06/02/2007  ANOREXIA, CHRONIC 08/07/2008   ANXIETY 09/15/2010   Arthritis    BACK PAIN, THORACIC REGION 07/07/2007   Bipolar disorder (HCC)    CERVICAL RADICULOPATHY 12/11/2008   Condyloma acuminatum 04/23/2009   CONSTIPATION 09/15/2010   DYSPHAGIA UNSPECIFIED 09/09/2009   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   Eating disorder    ENDOMETRIOSIS 12/16/2009   FATIGUE 06/11/2010   Fibromyalgia    GERD 10/23/2009   Heart palpitations    HERPES SIMPLEX INFECTION 06/11/2010   LENTIGO 04/23/2009   LUNG NODULE 06/17/2010   Narcotic abuse, continuous (HCC)    Ovarian cyst    Palpitations 10/23/2009   Panic  disorder    Renal disorder    Rheumatoid arthritis(714.0)    Stricture and stenosis of esophagus 05/13/2010   TOBACCO ABUSE 08/07/2008   Urinary tract infection    UTI (urinary tract infection)     Family History  Problem Relation Age of Onset   Depression Mother    Arthritis Mother    Hyperlipidemia Father    Hypertension Father     Past Surgical History:  Procedure Laterality Date   ABDOMINAL SURGERY     ABDOMINAL WALL MESH  REMOVAL     ABLATION ON ENDOMETRIOSIS     CESAREAN SECTION     x2    DILATION AND CURETTAGE OF UTERUS     x1    ENDOMETRIAL ABLATION     esophageal dilatation x 4     GUM SURGERY     HERNIA REPAIR     X3   PARTIAL HYSTERECTOMY     Social History   Occupational History   Not on file  Tobacco Use   Smoking status: Every Day    Current packs/day: 0.20    Types: Cigarettes   Smokeless tobacco: Current  Vaping Use   Vaping status: Every Day  Substance and Sexual Activity   Alcohol use: No   Drug use: No   Sexual activity: Yes    Birth control/protection: Surgical

## 2023-08-22 ENCOUNTER — Other Ambulatory Visit: Payer: Self-pay

## 2023-08-22 ENCOUNTER — Telehealth: Payer: Self-pay | Admitting: Orthopedic Surgery

## 2023-08-22 ENCOUNTER — Emergency Department (HOSPITAL_BASED_OUTPATIENT_CLINIC_OR_DEPARTMENT_OTHER)
Admission: EM | Admit: 2023-08-22 | Discharge: 2023-08-23 | Disposition: A | Discharge status: Home / Self Care | Payer: Medicare Other | Attending: Emergency Medicine | Admitting: Emergency Medicine

## 2023-08-22 ENCOUNTER — Encounter (HOSPITAL_BASED_OUTPATIENT_CLINIC_OR_DEPARTMENT_OTHER): Payer: Self-pay | Admitting: *Deleted

## 2023-08-22 DIAGNOSIS — R102 Pelvic and perineal pain: Secondary | ICD-10-CM | POA: Diagnosis not present

## 2023-08-22 DIAGNOSIS — R109 Unspecified abdominal pain: Secondary | ICD-10-CM | POA: Diagnosis present

## 2023-08-22 LAB — URINALYSIS, ROUTINE W REFLEX MICROSCOPIC
Bilirubin Urine: NEGATIVE
Glucose, UA: NEGATIVE mg/dL
Hgb urine dipstick: NEGATIVE
Ketones, ur: NEGATIVE mg/dL
Leukocytes,Ua: NEGATIVE
Nitrite: NEGATIVE
Protein, ur: NEGATIVE mg/dL
Specific Gravity, Urine: 1.024 (ref 1.005–1.030)
pH: 6 (ref 5.0–8.0)

## 2023-08-22 LAB — CBC
HCT: 40.8 % (ref 36.0–46.0)
Hemoglobin: 13.8 g/dL (ref 12.0–15.0)
MCH: 31.4 pg (ref 26.0–34.0)
MCHC: 33.8 g/dL (ref 30.0–36.0)
MCV: 92.7 fL (ref 80.0–100.0)
Platelets: 264 10*3/uL (ref 150–400)
RBC: 4.4 MIL/uL (ref 3.87–5.11)
RDW: 12 % (ref 11.5–15.5)
WBC: 8.8 10*3/uL (ref 4.0–10.5)
nRBC: 0 % (ref 0.0–0.2)

## 2023-08-22 NOTE — ED Triage Notes (Signed)
Pt here for severe suprapubic pain that began today which feels like pressure and she has hx of urinary incontinence but it is worse now.

## 2023-08-22 NOTE — ED Notes (Signed)
Pt adds that she is currently on ABT for UTI

## 2023-08-22 NOTE — Telephone Encounter (Signed)
Patient calling to schedule right shoulder surgery.  States she had MRI at [facility] and was originally scheduled to have surgery, but had other issues related to her back and wasn't not able to follow through. She would like to schedule ASAP since she has met her deductible.  Patient's cell [phone]  .

## 2023-08-23 ENCOUNTER — Emergency Department (HOSPITAL_BASED_OUTPATIENT_CLINIC_OR_DEPARTMENT_OTHER): Payer: Medicare Other

## 2023-08-23 LAB — COMPREHENSIVE METABOLIC PANEL
ALT: 18 U/L (ref 0–44)
AST: 26 U/L (ref 15–41)
Albumin: 4.1 g/dL (ref 3.5–5.0)
Alkaline Phosphatase: 95 U/L (ref 38–126)
Anion gap: 5 (ref 5–15)
BUN: 11 mg/dL (ref 6–20)
CO2: 28 mmol/L (ref 22–32)
Calcium: 8.8 mg/dL — ABNORMAL LOW (ref 8.9–10.3)
Chloride: 102 mmol/L (ref 98–111)
Creatinine, Ser: 0.7 mg/dL (ref 0.44–1.00)
GFR, Estimated: >60 mL/min (ref >60)
Glucose, Bld: 88 mg/dL (ref 70–99)
Potassium: 4.7 mmol/L (ref 3.5–5.1)
Sodium: 135 mmol/L (ref 135–145)
Total Bilirubin: 0.4 mg/dL (ref 0.3–1.2)
Total Protein: 6.9 g/dL (ref 6.5–8.1)

## 2023-08-23 LAB — LIPASE, BLOOD: Lipase: 15 U/L (ref 11–51)

## 2023-08-23 MED ORDER — SODIUM CHLORIDE 0.9 % IV BOLUS: 1000.0000 mL | Freq: Once | INTRAVENOUS | Status: DC

## 2023-08-23 MED ORDER — PROMETHAZINE HCL 25 MG/ML IJ SOLN
INTRAMUSCULAR | Status: AC
  Filled 2023-08-23: qty 1

## 2023-08-23 MED ORDER — FENTANYL CITRATE PF 50 MCG/ML IJ SOSY
50.0000 ug | PREFILLED_SYRINGE | Freq: Once | INTRAMUSCULAR | Status: DC
  Filled 2023-08-23: qty 1

## 2023-08-23 MED ORDER — HYDROMORPHONE HCL 1 MG/ML IJ SOLN
1.0000 mg | Freq: Once | INTRAMUSCULAR | Status: AC
Start: 2023-08-23 — End: 2023-08-23
  Administered 2023-08-23: 1 mg via INTRAVENOUS
  Filled 2023-08-23: qty 1

## 2023-08-23 MED ORDER — PROCHLORPERAZINE EDISYLATE 10 MG/2ML IJ SOLN
10.0000 mg | Freq: Once | INTRAMUSCULAR | Status: DC
  Filled 2023-08-23: qty 2

## 2023-08-23 MED ORDER — SODIUM CHLORIDE 0.9 % IV SOLN
12.5000 mg | Freq: Four times a day (QID) | INTRAVENOUS | Status: DC | PRN
  Administered 2023-08-23: 12.5 mg via INTRAVENOUS
  Filled 2023-08-23: qty 0.5

## 2023-08-23 MED ORDER — HYDROMORPHONE HCL 1 MG/ML IJ SOLN
2.0000 mg | Freq: Once | INTRAMUSCULAR | Status: AC
  Administered 2023-08-23: 2 mg via INTRAMUSCULAR
  Filled 2023-08-23: qty 2

## 2023-08-23 NOTE — ED Notes (Signed)
2 RN's attempted an IV start 4 times without success. MD notified.

## 2023-08-23 NOTE — Discharge Instructions (Signed)
Follow-up with your primary doctor if symptoms are not improving in the next few days.  Take ibuprofen 600 mg every 6 hours as needed for pain.

## 2023-08-23 NOTE — ED Provider Notes (Signed)
[facility] AT [facility] Provider Note   CSN: [id number] Arrival date & time: 08/22/23  2308     History  Chief Complaint  Patient presents with   Abdominal Pain    Ashaunti Tallon is a 47 y.o. female.  Patient is a 47 year old female with past medical history of panic disorder, GERD, endometriosis, chronic back pain, bipolar.  Patient presenting today for evaluation of abdominal pain.  She reports pain to her lower abdomen that began earlier this afternoon.  Pain has been worsening.  She denies any diarrhea or vomiting.  No fevers or chills.  She denies new urinary complaints.  No aggravating or alleviating factors.  The history is provided by the patient.       Home Medications Prior to Admission medications   Medication Sig Start Date End Date Taking? Authorizing Provider  diazepam (VALIUM) 5 MG tablet Take 1 tab at onset of migraine.  May repeat in 2 hrs, if needed.  Max dose: 2 tabs/day. This is a 30 day prescription. 01/18/23   Levert Feinstein, MD  escitalopram (LEXAPRO) 20 MG tablet Take 30 mg by mouth at bedtime. 12/20/18   [provider]  HYDROcodone-acetaminophen (NORCO/VICODIN) 5-325 MG tablet Take 1 tablet by mouth every 12 (twelve) hours as needed for moderate pain (pain score 4-6). 08/18/23   Cammy Copa, MD  LORazepam (ATIVAN) 1 MG tablet Take 1 tablet (1 mg total) by mouth every 8 (eight) hours as needed for anxiety. 09/14/22 09/14/23  [place], Alvira Philips, DO  Lurasidone HCl 60 MG TABS Take 60 mg by mouth daily. 12/16/22   [provider]  methylPREDNISolone (MEDROL DOSEPAK) 4 MG TBPK tablet See admin instructions. follow package directions 08/05/23   [provider]  oxyCODONE-acetaminophen (PERCOCET) 7.5-325 MG tablet Take 1 tablet by mouth every 6 (six) hours. 12/02/22   [provider]  phentermine (ADIPEX-P) 37.5 MG tablet Take 18.75 mg by mouth every morning. 07/18/23   [provider]   promethazine (PHENERGAN) 25 MG tablet Take 1 tablet (25 mg total) by mouth every 6 (six) hours as needed for up to 15 doses for nausea or vomiting. 04/14/23   Terald Sleeper, MD  rizatriptan (MAXALT-MLT) 10 MG disintegrating tablet Take 1 tablet (10 mg total) by mouth as needed. May repeat in 2 hours if needed 01/18/23   Levert Feinstein, MD      Allergies    Lamictal [lamotrigine], Morphine and codeine, Nicotine, Zofran, Chantix [varenicline tartrate], Ciprofloxacin, Codeine, Haldol [haloperidol], Ibuprofen, Lidoderm [lidocaine], Metaxalone, Sulfa antibiotics, Sulfasalazine, Toradol [ketorolac tromethamine], Duloxetine, Duloxetine hcl, Gabapentin, Ingrezza [valbenazine tosylate], Methadone, Pregabalin, Zolpidem, Zolpidem tartrate, Amoxicillin, Aripiprazole, Penicillins, Tramadol, and Valbenazine    Review of Systems   Review of Systems  All other systems reviewed and are negative.   Physical Exam Updated Vital Signs BP 115/85 (BP Location: Right Arm)   Pulse 99   Temp 98.4 F (36.9 C) (Oral)   Resp 17   LMP 01/01/2004   SpO2 98%  Physical Exam Vitals and nursing note reviewed.  Constitutional:      General: She is not in acute distress.    Appearance: She is well-developed. She is not diaphoretic.  HENT:     Head: Normocephalic and atraumatic.  Cardiovascular:     Rate and Rhythm: Normal rate and regular rhythm.     Heart sounds: No murmur heard.    No friction rub. No gallop.  Pulmonary:     Effort: Pulmonary effort is  normal. No respiratory distress.     Breath sounds: Normal breath sounds. No wheezing.  Abdominal:     General: Bowel sounds are normal. There is no distension.     Palpations: Abdomen is soft.     Tenderness: There is abdominal tenderness in the suprapubic area. There is no right CVA tenderness, left CVA tenderness, guarding or rebound.  Musculoskeletal:        General: Normal range of motion.     Cervical back: Normal range of motion and neck supple.  Skin:     General: Skin is warm and dry.  Neurological:     General: No focal deficit present.     Mental Status: She is alert and oriented to person, place, and time.     ED Results / Procedures / Treatments   Labs (all labs ordered are listed, but only abnormal results are displayed) Labs Reviewed  CBC  URINALYSIS, ROUTINE W REFLEX MICROSCOPIC  COMPREHENSIVE METABOLIC PANEL    EKG None  Radiology No results found.  Procedures Procedures    Medications Ordered in ED Medications  sodium chloride 0.9 % bolus 1,000 mL (has no administration in time range)  fentaNYL (SUBLIMAZE) injection 50 mcg (has no administration in time range)  prochlorperazine (COMPAZINE) injection 10 mg (has no administration in time range)    ED Course/ Medical Decision Making/ A&P  Patient is a 47 year old female presenting with complaints of abdominal pain as described in the HPI.  Patient arrives here with stable vital signs and is afebrile.  Physical examination reveals suprapubic tenderness, but an otherwise benign abdomen.  Laboratory studies obtained including CBC, metabolic panel, lipase, all of which are unremarkable.  Urinalysis is inconsistent with infection and pregnancy test is negative.  CT scan of the abdomen and pelvis obtained showing no acute intra-abdominal process.  Patient has been given Dilaudid for pain and Phenergan for nausea and seems to be feeling somewhat better.  At this point, nothing appears emergent and I feel as though patient can safely be discharged.  Final Clinical Impression(s) / ED Diagnoses Final diagnoses:  None    Rx / DC Orders ED Discharge Orders     None         Geoffery Lyons, MD 08/23/23 [phone]

## 2023-08-24 NOTE — Telephone Encounter (Signed)
I called patient to let her know surgery cannot be scheduled at this time.  Dr. August Saucer will need to review notes from [facility], along with the MRI, any reports or any special studies pertaining to her right shoulder. Patient states she will call [facility] and request the information be sent to our office.  Patient has my name and direct number to follow up.

## 2023-08-29 ENCOUNTER — Telehealth: Payer: Self-pay | Admitting: Orthopedic Surgery

## 2023-08-29 NOTE — Telephone Encounter (Signed)
Patient wants to know if you have had the opportunity to look at her MRI for her right shoulder.  She is inquiring about surgery wait time.  She would like to get something ASAP and before the end of the year.  Please let patient know if she needs to return in the office to discuss this.  [phone]

## 2023-08-31 NOTE — Telephone Encounter (Signed)
[name] called and left message on machine

## 2023-08-31 NOTE — Telephone Encounter (Signed)
Disc located and I have it available for Dr August Saucer to review when he arrives in clinic this afternoon.

## 2023-09-02 ENCOUNTER — Telehealth: Payer: Self-pay

## 2023-09-02 HISTORY — PX: SHOULDER SURGERY: SHX246

## 2023-09-02 NOTE — Telephone Encounter (Signed)
Transition Care Management Follow-up Telephone Call Date of discharge and from where: [facility] 9/27 How have you been since you were released from the hospital? Doing ok and has followed up with PCP Any questions or concerns? No  Items Reviewed: Did the pt receive and understand the discharge instructions provided? Yes  Medications obtained and verified? Yes  Other? No  Any new allergies since your discharge? No  Dietary orders reviewed? No Do you have support at home? Yes    Follow up appointments reviewed:  PCP Hospital f/u appt confirmed? Yes  Scheduled to see  on  @ . Specialist Hospital f/u appt confirmed? Yes  Scheduled to see  on  @ . Are transportation arrangements needed? NO If their condition worsens, is the pt aware to call PCP or go to the Emergency Dept.? Yes Was the patient provided with contact information for the PCP's office or ED? Yes Was to pt encouraged to call back with questions or concerns? Yes

## 2023-09-05 ENCOUNTER — Telehealth: Payer: Self-pay | Admitting: Surgical

## 2023-09-05 NOTE — Telephone Encounter (Signed)
Patient returned call asked for a call back concerning her surgery. Patient also said she is out of her pain medication Hydrocodone.  Patient said she is having more pain in her right shoulder. The number to contact patient is [phone]

## 2023-09-06 ENCOUNTER — Telehealth: Payer: Self-pay | Admitting: Orthopedic Surgery

## 2023-09-06 NOTE — Telephone Encounter (Signed)
Are you able to look this pt up on the drug website for Dr. August Saucer?

## 2023-09-06 NOTE — Telephone Encounter (Signed)
FYI

## 2023-09-06 NOTE — Telephone Encounter (Signed)
Hi Autumn.  Can you obtain her clinic visit notes.  That would be from [facility].  I did get the MRI scan which looked pretty reasonable but want to look at the clinic notes.  Also she recently was in the ER for abdominal pain and received Dilaudid.  Can you check on her pain medicine prescriptions over the last year as well.  Thanks thanks.  Do not really want to refill the pain medicine.

## 2023-09-06 NOTE — Telephone Encounter (Signed)
Pt called in stating she was trying to get ahold of [name] per his VM she stated he was going to call her back about setting up an appt for surgery, I advised pt she has to set an appt with August Saucer for surgery consultation so we did that, pt stated she wanted her surgery this month I advised her its a possibility but we can't say for sure. Pt was upset she had to keep calling and and speaking with people but no one knew what needed to be done she just kept getting transferred to [facility] and that was not indeed what she needed. Pt is also complaining about shoulder pain and she is out of pain medication please advise on refill.

## 2023-09-06 NOTE — Telephone Encounter (Signed)
Hydrocodone 5/325 #15 08/18/2023 from Dr. August Saucer 07/30/2023 Oxycodone 5 mg #30 by Henreitta Cea

## 2023-09-07 ENCOUNTER — Emergency Department (HOSPITAL_BASED_OUTPATIENT_CLINIC_OR_DEPARTMENT_OTHER)
Admission: EM | Admit: 2023-09-07 | Discharge: 2023-09-07 | Disposition: A | Discharge status: Home / Self Care | Payer: Medicare Other | Attending: Emergency Medicine | Admitting: Emergency Medicine

## 2023-09-07 ENCOUNTER — Other Ambulatory Visit: Payer: Self-pay

## 2023-09-07 DIAGNOSIS — M545 Low back pain, unspecified: Secondary | ICD-10-CM | POA: Insufficient documentation

## 2023-09-07 DIAGNOSIS — Z79899 Other long term (current) drug therapy: Secondary | ICD-10-CM | POA: Diagnosis not present

## 2023-09-07 DIAGNOSIS — M25511 Pain in right shoulder: Secondary | ICD-10-CM | POA: Insufficient documentation

## 2023-09-07 DIAGNOSIS — G8929 Other chronic pain: Secondary | ICD-10-CM | POA: Diagnosis not present

## 2023-09-07 MED ORDER — KETAMINE HCL 10 MG/ML IJ SOLN
0.3000 mg/kg | Freq: Once | INTRAMUSCULAR | Status: AC
  Administered 2023-09-07: 31 mg via INTRAVENOUS
  Filled 2023-09-07: qty 1

## 2023-09-07 MED ORDER — LORAZEPAM 2 MG/ML IJ SOLN
1.0000 mg | Freq: Once | INTRAMUSCULAR | Status: AC
  Administered 2023-09-07: 1 mg via INTRAVENOUS
  Filled 2023-09-07: qty 1

## 2023-09-07 NOTE — Telephone Encounter (Signed)
Request for medical records faxed to [facility]

## 2023-09-07 NOTE — Telephone Encounter (Signed)
I want to hold off on pain medicine for now.  Still waiting on [facility] to send [place] their office notes so I can get the complete picture of what exactly is going on in the shoulder.  The scan itself does show the calcific tendinitis but we have patients with that all the time who can do okay with or without surgery.

## 2023-09-07 NOTE — ED Triage Notes (Signed)
Pt c/o severe back pain. Pt is in PT for back pain and last had Pt on Monday. Today back pain is 10/10 and pt cannot walk. Pt also having left shoulder pain from an injury and is scheduled to have surgery in the future.

## 2023-09-07 NOTE — Telephone Encounter (Signed)
Are you able to help get the notes from [facility]?

## 2023-09-07 NOTE — Telephone Encounter (Signed)
thx

## 2023-09-07 NOTE — ED Provider Notes (Signed)
[place] EMERGENCY DEPARTMENT AT [facility] Provider Note   CSN: [id number] Arrival date & time: 09/07/23  1423     History  Chief Complaint  Patient presents with   Back Pain    Gina [name] is a 47 y.o. female.   Back Pain Patient presents acute on chronic back pain.  States she is in physical therapy for it.  States she thought she was doing well and then began to do worse.  States she thinks she did too much physical activity.  This is similar pains before in the back.  Also has right shoulder pain that she states she is due to get surgery on.  No loss of bladder bowel control.  History of rheumatoid arthritis.  Patient also has 27 medication allergies and reactions..    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 Authorizing Provider  diazepam (VALIUM) 5 MG tablet Take 1 tab at onset of migraine.  May repeat in 2 hrs, if needed.  Max dose: 2 tabs/day. This is a 30 day prescription. 01/18/23   Levert Feinstein, MD   escitalopram (LEXAPRO) 20 MG tablet Take 30 mg by mouth at bedtime. 12/20/18   [provider]  HYDROcodone-acetaminophen (NORCO/VICODIN) 5-325 MG tablet Take 1 tablet by mouth every 12 (twelve) hours as needed for moderate pain (pain score 4-6). 08/18/23   Cammy Copa, MD  LORazepam (ATIVAN) 1 MG tablet Take 1 tablet (1 mg total) by mouth every 8 (eight) hours as needed for anxiety. 09/14/22 09/14/23  [place], Alvira Philips, DO  Lurasidone HCl 60 MG TABS Take 60 mg by mouth daily. 12/16/22   [provider]  methylPREDNISolone (MEDROL DOSEPAK) 4 MG TBPK tablet See admin instructions. follow package directions 08/05/23   [provider]  oxyCODONE-acetaminophen (PERCOCET) 7.5-325 MG tablet Take 1 tablet by mouth every 6 (six) hours. 12/02/22   [provider]  phentermine (ADIPEX-P) 37.5 MG tablet Take 18.75 mg by mouth every morning. 07/18/23   [provider]  promethazine (PHENERGAN) 25 MG tablet Take 1 tablet (25 mg total) by mouth every 6 (six) hours as needed for up to 15 doses for nausea or vomiting. 04/14/23   Terald Sleeper, MD  rizatriptan (MAXALT-MLT) 10 MG disintegrating tablet Take 1 tablet (10 mg total) by mouth as needed. May repeat in 2 hours if needed 01/18/23   Levert Feinstein, MD      Allergies  Lamictal [lamotrigine], Morphine and codeine, Nicotine, Zofran, Chantix [varenicline tartrate], Ciprofloxacin, Codeine, Haldol [haloperidol], Ibuprofen, Lidoderm [lidocaine], Metaxalone, Sulfa antibiotics, Sulfasalazine, Toradol [ketorolac tromethamine], Duloxetine, Duloxetine hcl, Gabapentin, Ingrezza [valbenazine tosylate], Methadone, Pregabalin, Zolpidem, Zolpidem tartrate, Amoxicillin, Aripiprazole, Penicillins, Tramadol, and Valbenazine    Review of Systems   Review of Systems  Musculoskeletal:  Positive for back pain.    Physical Exam Updated Vital Signs BP 115/84 (BP Location: Right Arm)   Pulse 84   Temp 98.4 F (36.9 C) (Oral)   Resp  18   Ht 5\' 7"  (1.702 m)   Wt 104.8 kg   LMP 01/01/2004   SpO2 100%   BMI 36.18 kg/m  Physical Exam Vitals and nursing note reviewed.  Musculoskeletal:     Comments: Sitting in bed still.  Able to move her extremities but with pain.  Neurological:     Mental Status: She is alert and oriented to person, place, and time.     ED Results / Procedures / Treatments   Labs (all labs ordered are listed, but only abnormal results are displayed) Labs Reviewed - No data to display  EKG None  Radiology No results found.  Procedures Procedures    Medications Ordered in ED Medications  ketamine (KETALAR) injection 31 mg (31 mg Intravenous Given 09/07/23 1644)  LORazepam (ATIVAN) injection 1 mg (1 mg Intravenous Given 09/07/23 1716)    ED Course/ Medical Decision Making/ A&P                                 Medical Decision Making Risk Prescription drug management.   Patient with acute on chronic back pain.  Has had 8 visits to the ER in the last 6 months for various pains shoulder abdominal and back.  I have reviewed neurosurgery note, Ortho note. Reviewed drug database.  She has had 63 prescriptions over the last 2 years from 24 different providers.  I think this is acute on chronic back pain.  Will attempt some ketamine to see if it will help with her chronic pain.  May also give some muscle relaxers or Ativan.  Reviewed recent MRI and recent CT scans.  MRI from a month ago was reassuring.  As was the CT scan of the abd pelvis from 2 weeks ago.  Back pain feeling better after ketamine but asked for more medicine for her shoulder.  Had been given some Ativan.  Had asked for narcotics however had been refused narcotics from Ortho yesterday and do not think we need to prescribe for her shoulder.  Will discharge home with outpatient follow-up.         Final Clinical Impression(s) / ED Diagnoses Final diagnoses:  Chronic midline low back pain without sciatica  Chronic  shoulder pain, unspecified laterality    Rx / DC Orders ED Discharge Orders     None         Benjiman Core, MD 09/07/23 2353

## 2023-09-14 NOTE — Telephone Encounter (Signed)
She has appointment with Dr August Saucer on Friday to discuss further by the looks of it

## 2023-09-16 ENCOUNTER — Ambulatory Visit (INDEPENDENT_AMBULATORY_CARE_PROVIDER_SITE_OTHER): Payer: Medicare Other | Admitting: Orthopedic Surgery

## 2023-09-16 DIAGNOSIS — M7531 Calcific tendinitis of right shoulder: Secondary | ICD-10-CM | POA: Diagnosis not present

## 2023-09-16 DIAGNOSIS — M19011 Primary osteoarthritis, right shoulder: Secondary | ICD-10-CM | POA: Diagnosis not present

## 2023-09-17 ENCOUNTER — Encounter: Payer: Self-pay | Admitting: Orthopedic Surgery

## 2023-09-17 NOTE — Progress Notes (Signed)
Office Visit Note   Patient: Gina [name]           Date of Birth: [birth year]           MRN: [id number] Visit Date: 09/16/2023 Requested by: Charolett Bumpers, PA-C [id number] [place],  [place] [address] PCP: Bradley Ferris  Subjective: Chief Complaint  Patient presents with   Right Shoulder - Pain    HPI: Gina [name] is a 47 y.o. female who presents to the office reporting .  Right shoulder pain.  She cannot sleep on the right-hand side but not comfortably.  When she works from overhead to below shoulder level she has significant pain.  Physical therapy has not been helpful.  She uses heat on the shoulder.  She does report having an AC joint injection in the shoulder which helped but it was only temporary.  Prior injection from the back was not helpful for prolonged period of time.  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  She is currently taking pain medicine from dental work.  Does report both superior and lateral pain in the right shoulder.  Rotator cuff intact on MRI scan.  Symptoms now ongoing for about 4 to 5 months.              ROS: All systems reviewed are negative as they relate to the chief complaint within the history of present illness.  Patient denies fevers or chills.  Assessment & Plan: Visit Diagnoses:  1. Calcific tendinitis of right shoulder   2. Arthritis of right acromioclavicular joint     Plan: Impression is right shoulder pain with symptomatic calcific tendinitis and symptomatic AC joint arthritis.  Both of these have been helped temporarily with injections.  Old notes are reviewed today.  There is 1 clinic visit missing which demonstrates a repeat injection about 1 week after the prior injection.  This is going by patient report.  Overall she is symptomatic in the [facility] joint today as well as in that area where there is calcific tendinitis.  Operative  plan at this time would be arthroscopy with evaluation of the rotator cuff and biceps tendon.  Both of those do appear intact on MRI scanning.  Plan for arthroscopic distal clavicle excision and localization of that calcific deposit with decompression along with subacromial decompression and bursectomy.  The risk and benefits of the procedure are discussed with the patient including not limited to infection nerve vessel damage incomplete pain relief as well as incomplete functional restoration.  Patient understands risk and benefits and wishes to proceed.  All questions answered.  Follow-Up Instructions: No follow-ups on file.   Orders:  No orders of the defined types were placed in this encounter.  No orders of the defined types were placed in this encounter.     Procedures: No procedures performed   Clinical Data: No additional findings.  Objective: Vital Signs: LMP 01/01/2004   Physical Exam:  Constitutional: Patient appears well-developed HEENT:  Head: Normocephalic Eyes:EOM are normal Neck: Normal range of motion Cardiovascular: Normal rate Pulmonary/chest: Effort normal Neurologic: Patient is alert Skin: Skin is warm Psychiatric: Patient has normal mood and affect  Ortho Exam: Ortho exam demonstrates range of motion on the right of 70/85/160 range of motion on the left is 70/95/170.  Does have asymmetric tenderness to [facility] joint palpation right versus left.  Does have pain to resisted  abduction on the right compared to the left which she localizes to the lateral deltoid region.  No discrete bicipital groove tenderness on the right with negative O'Brien's testing on the right and left.  Cervical spine range of motion intact.  Rotator cuff strength intact infraspinatus supraspinatus and subscap muscle testing.  No coarse grinding or crepitus with internal/external rotation of the arm at 90 degrees of abduction.  Specialty Comments:  Narrative & Impression CLINICAL DATA:  Low  back pain, prior surgery, new symptoms. Ongoing low back pain for 2 months. No known injury. No prior surgery to area.   EXAM: MRI LUMBAR SPINE WITHOUT AND WITH CONTRAST   TECHNIQUE: Multiplanar and multiecho pulse sequences of the lumbar spine were obtained without and with intravenous contrast.   CONTRAST:  10mL GADAVIST GADOBUTROL 1 MMOL/ML IV SOLN   COMPARISON:  Lumbar MRI 04/02/2018. CT of the abdomen and pelvis 06/15/2023. Lumbar spine radiographs 09/18/2018.   FINDINGS: Segmentation: Conventional anatomy assumed, with the last open disc space designated L5-S1.Concordant with prior imaging.   Alignment: Mild convex right scoliosis. No focal angulation or listhesis.   Vertebrae: No worrisome osseous lesion, acute fracture or pars defect. Chronic Schmorl's node in the inferior endplate of L1. No evidence of discitis or osteomyelitis. No obvious postsurgical changes in the lumbar spine.   Conus medullaris: Extends to the L1-2 level and appears normal. No abnormal intradural enhancement.   Paraspinal and other soft tissues: No significant paraspinal findings.   Disc levels:   Sagittal images demonstrate no significant disc space findings within the visualized lower thoracic spine.   L1-2: Stable loss of disc height with disc bulging and endplate osteophytes. No spinal stenosis or nerve root encroachment.   L2-3: Normal interspace.   L3-4: Normal interspace.   L4-5: Normal interspace.   L5-S1: Normal interspace.   IMPRESSION: 1. No acute findings or explanation for the patient's symptoms. 2. Stable spondylosis at L1-2 without resulting spinal stenosis or nerve root encroachment.     Electronically Signed   By: Carey Bullocks M.D.   On: 07/29/2023 18:36  Imaging: No results found.   PMFS History: Patient Active Problem List   Diagnosis Date Noted   Persistent headaches 01/18/2023   Blurry vision 01/18/2023   Weight gain 01/18/2023   SBO (small  bowel obstruction) (HCC) 09/13/2022   Nausea and vomiting 09/11/2022   Partial small bowel obstruction (HCC) 09/10/2022   Piriformis syndrome of both sides 05/31/2019   History of bipolar disorder 04/18/2019   Tardive dyskinesia 04/18/2019   Urticaria due to drug allergy 04/18/2019   Lumbar spondylosis 02/05/2019   Bipolar affective disorder, current episode mixed (HCC) 01/02/2019   Arthritis 01/02/2019   Cigarette nicotine dependence without complication 01/02/2019   Bilateral temporomandibular joint pain 10/02/2018   Referred otalgia, bilateral 10/02/2018   Paresthesias 03/09/2018   Chronic migraine without aura, intractable, with status migrainosus 04/22/2016   Bipolar 1 disorder, mixed, moderate (HCC) 07/15/2015    Class: Chronic   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   Sciatica 01/09/2015   Reactive hypoglycemia 12/17/2013   Musculoskeletal malfunction arising from mental factors 04/04/2013   Incisional hernia 11/15/2012   Other postprocedural status(V45.89) 11/15/2012   S/P hernia surgery 11/15/2012   Abdominal pain 11/11/2012   Chronic pain syndrome 11/11/2012   Disorder of sacrum 11/11/2012   Chronic pain 12/21/2011   Heartburn 10/04/2011   DYSPHAGIA 10/04/2011   Depression with anxiety 09/15/2010   CONSTIPATION 09/15/2010   LUNG NODULE 06/17/2010  Herpes simplex virus (HSV) infection 06/11/2010   FATIGUE 06/11/2010   STRICTURE AND STENOSIS OF ESOPHAGUS 05/13/2010   ABDOMINAL WALL HERNIA 02/27/2010   ENDOMETRIOSIS 12/16/2009   GERD 10/23/2009   PALPITATIONS 10/23/2009   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   DYSTHYMIC DISORDER 06/20/2009   CONDYLOMA ACUMINATUM 04/23/2009   LENTIGO 04/23/2009   DENTAL PAIN 03/19/2009   CERVICAL RADICULOPATHY 12/11/2008   Cervical radiculopathy 12/11/2008   PANIC DISORDER WITHOUT AGORAPHOBIA 08/07/2008   TOBACCO ABUSE 08/07/2008   BACK PAIN, THORACIC REGION 07/07/2007   ALLERGIC RHINITIS 06/02/2007    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   Eating disorder    ENDOMETRIOSIS 12/16/2009   FATIGUE 06/11/2010   Fibromyalgia    GERD 10/23/2009   Heart palpitations    HERPES SIMPLEX INFECTION 06/11/2010   LENTIGO 04/23/2009   LUNG NODULE 06/17/2010   Narcotic abuse, continuous (HCC)    Ovarian cyst    Palpitations 10/23/2009   Panic disorder    Renal disorder    Rheumatoid arthritis(714.0)    Stricture and stenosis of esophagus 05/13/2010   TOBACCO ABUSE 08/07/2008   Urinary tract infection    UTI (urinary tract infection)     Family History  Problem Relation Age of Onset   Depression Mother    Arthritis Mother    Hyperlipidemia Father    Hypertension Father     Past Surgical History:  Procedure Laterality Date   ABDOMINAL SURGERY     ABDOMINAL WALL MESH  REMOVAL     ABLATION ON ENDOMETRIOSIS     CESAREAN SECTION     x2    DILATION AND CURETTAGE OF UTERUS     x1    ENDOMETRIAL ABLATION     esophageal dilatation x 4     GUM SURGERY     HERNIA REPAIR     X3   PARTIAL HYSTERECTOMY     Social History   Occupational History   Not on file  Tobacco Use   Smoking status: Every Day    Current packs/day: 0.20    Types: Cigarettes   Smokeless tobacco: Current  Vaping Use   Vaping status: Every Day  Substance and Sexual Activity   Alcohol use: No   Drug use: No   Sexual activity: Yes    Birth control/protection: Surgical

## 2023-09-21 NOTE — Pre-Procedure Instructions (Signed)
Surgical Instructions    Your procedure is scheduled on Tuesday, November 26th.  Report to [facility] Entr[facility] "A" at 1115 A.M., then check in with the Admitting office.  Call this number if you have problems the morning of surgery:  [phone]  If you have any questions prior to your surgery date call [phone]: Open Monday-Friday 8am-4pm If you experience any cold or flu symptoms such as cough, fever, chills, shortness of breath, etc. between now and your scheduled surgery, please notify us at the above number.     Remember:  Do not eat after midnight the night before your surgery  You may drink clear liquids until 1015 AM the morning of your surgery.   Clear liquids allowed are: Water, Non-Citrus Juices (without pulp), Carbonated Beverages, Clear Tea, Black Coffee Only (NO MILK, CREAM OR POWDERED CREAMER of any kind), and Gatorade.    Take these medicines the morning of surgery with A SIP OF WATER :              cyclobenzaprine (FLEXERIL)              omeprazole (PRILOSEC)              HYDROcodone-acetaminophen (NORCO) as needed             LORazepam (ATIVAN) as needed  As of today, STOP taking any Aspirin (unless otherwise instructed by your surgeon) Aleve, Naproxen, Ibuprofen, Motrin, Advil, Goody's, BC's, all herbal medications, fish oil, and all vitamins.                     Do NOT Smoke (Tobacco/Vaping) for 24 hours prior to your procedure.  If you use a CPAP at night, you may bring your mask/headgear for your overnight stay.   Contacts, glasses, piercing's, hearing aid's, dentures or partials may not be worn into surgery, please bring cases for these belongings.    For patients admitted to the hospital, discharge time will be determined by your treatment team.   Patients discharged the day of surgery will not be allowed to drive home, and someone needs to stay with them for 24 hours.  SURGICAL WAITING ROOM VISITATION Patients having surgery or a procedure may  have no more than 2 support people in the waiting area - these visitors may rotate.   Children under the age of 13 must have an adult with them who is not the patient. If the patient needs to stay at the hospital during part of their recovery, the visitor guidelines for inpatient rooms apply. Pre-op nurse will coordinate an appropriate time for 1 support person to accompany patient in pre-op.  This support person may not rotate.   Please refer to the [facility] website for the visitor guidelines for Inpatients (after your surgery is over and you are in a regular room).    Special instructions:   [place]- Preparing For Surgery    Oral Hygiene is also important to reduce your risk of infection.  Remember - BRUSH YOUR TEETH THE MORNING OF SURGERY WITH YOUR REGULAR TOOTHPASTE  [place]- Preparing for Total Shoulder Arthroplasty  Before surgery, you can play an important role. Because skin is not sterile, your skin needs to be as free of germs as possible. You can reduce the number of germs on your skin by using the following products.   Benzoyl Peroxide Gel  o Reduces the number of germs present on the skin  o Applied twice a day to  shoulder area starting two days before surgery   Chlorhexidine Gluconate (CHG) Soap (instructions listed above on how to wash with CHG Soap)  o An antiseptic cleaner that kills germs and bonds with the skin to continue killing germs even after washing  o Used for showering the night before surgery and morning of surgery   ==================================================================  Please follow these instructions carefully:  BENZOYL PEROXIDE 5% GEL  Please do not use if you have an allergy to benzoyl peroxide. If your skin becomes reddened/irritated stop using the benzoyl peroxide.  Starting two days before surgery, apply as follows:  1. Apply benzoyl peroxide in the morning and at night. Apply after taking a shower. If you are not  taking a shower clean entire shoulder front, back, and side along with the armpit with a clean wet washcloth.  2. Place a quarter-sized dollop on your SHOULDER and rub in thoroughly, making sure to cover the front, back, and side of your shoulder, along with the armpit.   2 Days prior to Surgery First Dose on _____________ Morning Second Dose on ______________ Night  Day Before Surgery First Dose on ______________ Morning Night before surgery wash (entire body except face and private areas) with CHG Soap THEN Second Dose on ____________ Night   Morning of Surgery  wash BODY AGAIN with CHG Soap   4. Do NOT apply benzoyl peroxide gel on the day of surgery   [place]- Preparing For Surgery  Before surgery, you can play an important role. Because skin is not sterile, your skin needs to be as free of germs as possible. You can reduce the number of germs on your skin by washing with CHG (chlorahexidine gluconate) Soap before surgery.  CHG is an antiseptic cleaner which kills germs and bonds with the skin to continue killing germs even after washing.     Please do not use if you have an allergy to CHG or antibacterial soaps. If your skin becomes reddened/irritated stop using the CHG.  Do not shave (including legs and underarms) for at least 48 hours prior to first CHG shower. It is OK to shave your face.  Please follow these instructions carefully.     Shower the NIGHT BEFORE SURGERY and the MORNING OF SURGERY with CHG Soap.   If you chose to wash your hair, wash your hair first as usual with your normal shampoo. After you shampoo, rinse your hair and body thoroughly to remove the shampoo.  Then [employer] and genitals (private parts) with your normal soap and rinse thoroughly to remove soap.  After that Use CHG Soap as you would any other liquid soap. You can apply CHG directly to the skin and wash gently with a scrungie or a clean washcloth.   Apply the CHG Soap to your body ONLY  FROM THE NECK DOWN.  Do not use on open wounds or open sores. Avoid contact with your eyes, ears, mouth and genitals (private parts). Wash Face and genitals (private parts)  with your normal soap.   Wash thoroughly, paying special attention to the area where your surgery will be performed.  Thoroughly rinse your body with warm water from the neck down.  DO NOT shower/wash with your normal soap after using and rinsing off the CHG Soap.  Pat yourself dry with a CLEAN TOWEL.  8. Apply the Benzoyl Peroxide only the night before surgery.  Do Not use it the morning of surgery.  Wear CLEAN PAJAMAS to bed the night before surgery  Place CLEAN SHEETS on your bed the night before your surgery  DO NOT SLEEP WITH PETS.   Day of Surgery: Take a shower with CHG soap. Wear Clean/Comfortable clothing the morning of surgery Do not apply any deodorants/lotions.   Remember to brush your teeth WITH YOUR REGULAR TOOTHPASTE.   Please read over the following fact sheets that you were given.    If you received a COVID test during your pre-op visit  it is requested that you wear a mask when out in public, stay away from anyone that may not be feeling well and notify your surgeon if you develop symptoms. If you have been in contact with anyone that has tested positive in the last 10 days please notify you surgeon.

## 2023-09-22 ENCOUNTER — Telehealth: Payer: Self-pay

## 2023-09-22 ENCOUNTER — Other Ambulatory Visit: Payer: Self-pay

## 2023-09-22 ENCOUNTER — Encounter (HOSPITAL_COMMUNITY): Payer: Self-pay

## 2023-09-22 ENCOUNTER — Encounter (HOSPITAL_COMMUNITY)
Admission: RE | Admit: 2023-09-22 | Discharge: 2023-09-22 | Disposition: A | Discharge status: Home / Self Care | Payer: Medicare Other | Source: Ambulatory Visit | Attending: Orthopedic Surgery | Admitting: Orthopedic Surgery

## 2023-09-22 DIAGNOSIS — Z01812 Encounter for preprocedural laboratory examination: Secondary | ICD-10-CM | POA: Diagnosis not present

## 2023-09-22 DIAGNOSIS — Z01818 Encounter for other preprocedural examination: Secondary | ICD-10-CM | POA: Diagnosis present

## 2023-09-22 LAB — BASIC METABOLIC PANEL
Anion gap: 6 (ref 5–15)
BUN: 8 mg/dL (ref 6–20)
CO2: 23 mmol/L (ref 22–32)
Calcium: 9 mg/dL (ref 8.9–10.3)
Chloride: 108 mmol/L (ref 98–111)
Creatinine, Ser: 0.61 mg/dL (ref 0.44–1.00)
GFR, Estimated: >60 mL/min (ref >60)
Glucose, Bld: 85 mg/dL (ref 70–99)
Potassium: 4 mmol/L (ref 3.5–5.1)
Sodium: 137 mmol/L (ref 135–145)

## 2023-09-22 LAB — CBC
HCT: 44.8 % (ref 36.0–46.0)
Hemoglobin: 14.7 g/dL (ref 12.0–15.0)
MCH: 30.6 pg (ref 26.0–34.0)
MCHC: 32.8 g/dL (ref 30.0–36.0)
MCV: 93.3 fL (ref 80.0–100.0)
Platelets: 230 10*3/uL (ref 150–400)
RBC: 4.8 MIL/uL (ref 3.87–5.11)
RDW: 12.3 % (ref 11.5–15.5)
WBC: 8.6 10*3/uL (ref 4.0–10.5)
nRBC: 0 % (ref 0.0–0.2)

## 2023-09-22 NOTE — Progress Notes (Signed)
Surgical Instructions                 Your procedure is scheduled on Tuesday, November 26th.             Report to [facility] Main Entrance "A" at 11:15 A.M., then check in with the Admitting office.             Call this number if you have problems the morning of surgery:             [phone]   If you have any questions prior to your surgery date call [phone]: Open Monday-Friday 8am-4pm If you experience any cold or flu symptoms such as cough, fever, chills, shortness of breath, etc. between now and your scheduled surgery, please notify us at the above number.                  Remember:             Do not eat after midnight the night before your surgery   You may drink clear liquids until 10:15 AM the morning of your surgery.   Clear liquids allowed are: Water, Non-Citrus Juices (without pulp), Carbonated Beverages, Clear Tea, Black Coffee Only (NO MILK, CREAM OR POWDERED CREAMER of any kind), and Gatorade.  Patient Instructions  The night before surgery:  No food after midnight. ONLY clear liquids after midnight  The day of surgery (if you do NOT have diabetes):  Drink ONE (1) Pre-Surgery Clear Ensure by 10:15 the morning of surgery. Drink in one sitting. Do not sip.  This drink was given to you during your hospital  pre-op appointment visit.  Nothing else to drink after completing the  Pre-Surgery Clear Ensure.          If you have questions, please contact your surgeon's office.                           Take these medicines the morning of surgery with A SIP OF WATER :               cyclobenzaprine (FLEXERIL)              omeprazole (PRILOSEC)              HYDROcodone-acetaminophen (NORCO) as needed             LORazepam (ATIVAN) as needed   As of today, STOP taking any Aspirin (unless otherwise instructed by your surgeon) Aleve, Naproxen, Ibuprofen, Motrin, Advil, Goody's, BC's, all herbal medications, fish oil, and all vitamins.                     Do NOT  Smoke (Tobacco/Vaping) for 24 hours prior to your procedure.   If you use a CPAP at night, you may bring your mask/headgear for your overnight stay.   Contacts, glasses, piercing's, hearing aid's, dentures or partials may not be worn into surgery, please bring cases for these belongings.    For patients admitted to the hospital, discharge time will be determined by your treatment team.   Patients discharged the day of surgery will not be allowed to drive home, and someone needs to stay with them for 24 hours.   SURGICAL WAITING ROOM VISITATION Patients having surgery or a procedure may have no more than 2 support people in the waiting area - these visitors may rotate.   Children under the age of 62  must have an adult with them who is not the patient. If the patient needs to stay at the hospital during part of their recovery, the visitor guidelines for inpatient rooms apply. Pre-op nurse will coordinate an appropriate time for 1 support person to accompany patient in pre-op.  This support person may not rotate.    Please refer to the [facility] website for the visitor guidelines for Inpatients (after your surgery is over and you are in a regular room).      Special instructions:   [place]- Preparing For Surgery       Oral Hygiene is also important to reduce your risk of infection.  Remember - BRUSH YOUR TEETH THE MORNING OF SURGERY WITH YOUR REGULAR TOOTHPASTE   [place]- Preparing for Total Shoulder Arthroplasty   Before surgery, you can play an important role. Because skin is not sterile, your skin needs to be as free of germs as possible. You can reduce the number of germs on your skin by using the following products.    Benzoyl Peroxide Gel   o Reduces the number of germs present on the skin   o Applied twice a day to shoulder area starting two days before surgery    Chlorhexidine Gluconate (CHG) Soap (instructions listed above on how to wash with CHG Soap)   o An  antiseptic cleaner that kills germs and bonds with the skin to continue killing germs even after washing   o Used for showering the night before surgery and morning of surgery     ==================================================================   Please follow these instructions carefully:   BENZOYL PEROXIDE 5% GEL   Please do not use if you have an allergy to benzoyl peroxide. If your skin becomes reddened/irritated stop using the benzoyl peroxide.   Starting two days before surgery, apply as follows:   1. Apply benzoyl peroxide in the morning and at night. Apply after taking a shower. If you are not taking a shower clean entire shoulder front, back, and side along with the armpit with a clean wet washcloth.   2. Place a quarter-sized dollop on your SHOULDER and rub in thoroughly, making sure to cover the front, back, and side of your shoulder, along with the armpit.               2 Days prior to Surgery First Dose on _____________ Morning Second Dose on ______________ Night   Day Before Surgery First Dose on ______________ Morning Night before surgery wash (entire body except face and private areas) with CHG Soap THEN Second Dose on ____________ Night    Morning of Surgery  wash BODY AGAIN with CHG Soap     4. Do NOT apply benzoyl peroxide gel on the day of surgery     [place]- Preparing For Surgery   Before surgery, you can play an important role. Because skin is not sterile, your skin needs to be as free of germs as possible. You can reduce the number of germs on your skin by washing with CHG (chlorahexidine gluconate) Soap before surgery.  CHG is an antiseptic cleaner which kills germs and bonds with the skin to continue killing germs even after washing.       Please do not use if you have an allergy to CHG or antibacterial soaps. If your skin becomes reddened/irritated stop using the CHG.  Do not shave (including legs and underarms) for at least 48 hours prior to  first CHG shower. It is OK to  shave your face.   Please follow these instructions carefully.                 Shower the NIGHT BEFORE SURGERY and the MORNING OF SURGERY with CHG Soap.              If you chose to wash your hair, wash your hair first as usual with your normal shampoo. After you shampoo, rinse your hair and body thoroughly to remove the shampoo.  Then [employer] and genitals (private parts) with your normal soap and rinse thoroughly to remove soap.   After that Use CHG Soap as you would any other liquid soap. You can apply CHG directly to the skin and wash gently with a scrungie or a clean washcloth.    Apply the CHG Soap to your body ONLY FROM THE NECK DOWN.  Do not use on open wounds or open sores. Avoid contact with your eyes, ears, mouth and genitals (private parts). Wash Face and genitals (private parts)  with your normal soap.    Wash thoroughly, paying special attention to the area where your surgery will be performed.   Thoroughly rinse your body with warm water from the neck down.   DO NOT shower/wash with your normal soap after using and rinsing off the CHG Soap.   Pat yourself dry with a CLEAN TOWEL.   8. Apply the Benzoyl Peroxide only the night before surgery.  Do Not use it the morning of surgery.   Wear CLEAN PAJAMAS to bed the night before surgery   Place CLEAN SHEETS on your bed the night before your surgery   DO NOT SLEEP WITH PETS.     Day of Surgery: Take a shower with CHG soap. Wear Clean/Comfortable clothing the morning of surgery Do not apply any deodorants/lotions.   Remember to brush your teeth WITH YOUR REGULAR TOOTHPASTE.   Please read over the following fact sheets that you were given.       If you received a COVID test during your pre-op visit  it is requested that you wear a mask when out in public, stay away from anyone that may not be feeling well and notify your surgeon if you develop symptoms. If you have been in contact with  anyone that has tested positive in the last 10 days please notify you surgeon.

## 2023-09-22 NOTE — Telephone Encounter (Signed)
Transition Care Management Unsuccessful Follow-up Telephone Call  Date of discharge and from where:  09/07/2023 [facility]  Attempts:  2nd Attempt  Reason for unsuccessful TCM follow-up call:  Left voice message  Danne Scardina [facility]  [facility], [facility] Guide Direct Dial: [phone]  Website: [email or web address]

## 2023-09-22 NOTE — Telephone Encounter (Signed)
Transition Care Management Unsuccessful Follow-up Telephone Call  Date of discharge and from where:  09/07/2023 [facility]  Attempts:  1st Attempt  Reason for unsuccessful TCM follow-up call:  Unable to reach patient patient was at hospital pre-op visit.  Haroun Cotham [facility]  [facility], [facility] Guide Direct Dial: [phone]  Website: [email or web address]

## 2023-09-22 NOTE — Progress Notes (Signed)
PCP - Dr. Cruz Condon Cardiologist - denies  PPM/ICD - denies Device Orders - n/a Rep Notified - n/a  Chest x-ray - denies EKG - 06/14/23 Stress Test - denies ECHO - denies Cardiac Cath - denies  Sleep Study - pt states that it was negative for OSA CPAP -  n/a   Last dose of GLP1 agonist-  n/a GLP1 instructions:  n/a  Blood Thinner Instructions: n/a Aspirin Instructions: n/a  ERAS Protcol - clears until 10:15 PRE-SURGERY Ensure or G2- Pre-Surgery Ensure by 10:15  COVID TEST-  n/a   Anesthesia review:  no  Patient denies shortness of breath, fever, cough and chest pain at PAT appointment   All instructions explained to the patient, with a verbal understanding of the material. Patient agrees to go over the instructions while at home for a better understanding. Patient also instructed to self quarantine after being tested for COVID-19. The opportunity to ask questions was provided.

## 2023-09-26 ENCOUNTER — Ambulatory Visit: Payer: Medicare Other | Admitting: Orthopedic Surgery

## 2023-09-27 ENCOUNTER — Encounter (HOSPITAL_COMMUNITY)
Admission: RE | Disposition: A | Discharge status: Home / Self Care | Payer: Self-pay | Source: Home / Self Care | Attending: Orthopedic Surgery

## 2023-09-27 ENCOUNTER — Ambulatory Visit (HOSPITAL_COMMUNITY): Payer: Medicare Other

## 2023-09-27 ENCOUNTER — Encounter (HOSPITAL_COMMUNITY): Payer: Self-pay | Admitting: Orthopedic Surgery

## 2023-09-27 ENCOUNTER — Observation Stay (HOSPITAL_COMMUNITY)
Admission: RE | Admit: 2023-09-27 | Discharge: 2023-09-28 | Disposition: A | Discharge status: Home / Self Care | Payer: Medicare Other | Attending: Orthopedic Surgery | Admitting: Orthopedic Surgery

## 2023-09-27 ENCOUNTER — Other Ambulatory Visit: Payer: Self-pay

## 2023-09-27 DIAGNOSIS — M7551 Bursitis of right shoulder: Secondary | ICD-10-CM

## 2023-09-27 DIAGNOSIS — Z9889 Other specified postprocedural states: Secondary | ICD-10-CM | POA: Diagnosis present

## 2023-09-27 DIAGNOSIS — M7530 Calcific tendinitis of unspecified shoulder: Secondary | ICD-10-CM

## 2023-09-27 DIAGNOSIS — M7531 Calcific tendinitis of right shoulder: Secondary | ICD-10-CM | POA: Insufficient documentation

## 2023-09-27 DIAGNOSIS — F17210 Nicotine dependence, cigarettes, uncomplicated: Secondary | ICD-10-CM | POA: Diagnosis not present

## 2023-09-27 DIAGNOSIS — M19011 Primary osteoarthritis, right shoulder: Secondary | ICD-10-CM

## 2023-09-27 DIAGNOSIS — Z01818 Encounter for other preprocedural examination: Principal | ICD-10-CM

## 2023-09-27 DIAGNOSIS — Z79899 Other long term (current) drug therapy: Secondary | ICD-10-CM | POA: Diagnosis not present

## 2023-09-27 SURGERY — SHOULDER ARTHROSCOPY WITH SUBACROMIAL DECOMPRESSION AND DISTAL CLAVICLE EXCISION
Anesthesia: Regional | Laterality: Right

## 2023-09-27 MED ORDER — ROCURONIUM BROMIDE 10 MG/ML (PF) SYRINGE
PREFILLED_SYRINGE | INTRAVENOUS | Status: AC
  Filled 2023-09-27: qty 10

## 2023-09-27 MED ORDER — CHLORHEXIDINE GLUCONATE 0.12 % MT SOLN
OROMUCOSAL | Status: AC
  Filled 2023-09-27: qty 15

## 2023-09-27 MED ORDER — PROMETHAZINE HCL 25 MG/ML IJ SOLN
INTRAMUSCULAR | Status: AC
  Administered 2023-09-27: 6.25 mg
  Filled 2023-09-27: qty 1

## 2023-09-27 MED ORDER — OXYCODONE HCL 5 MG PO TABS
5.0000 mg | ORAL_TABLET | ORAL | Status: DC | PRN
  Administered 2023-09-27 – 2023-09-28 (×3): 10 mg via ORAL
  Filled 2023-09-27 (×3): qty 2

## 2023-09-27 MED ORDER — METOCLOPRAMIDE HCL 5 MG/ML IJ SOLN: 5.0000 mg | Freq: Three times a day (TID) | INTRAMUSCULAR | Status: DC | PRN

## 2023-09-27 MED ORDER — DEXMEDETOMIDINE HCL IN NACL 200 MCG/50ML IV SOLN
INTRAVENOUS | Status: DC | PRN
Start: 2023-09-27 — End: 2023-09-27
  Administered 2023-09-27 (×4): 8 ug via INTRAVENOUS
  Administered 2023-09-27 (×2): 4 ug via INTRAVENOUS

## 2023-09-27 MED ORDER — FENTANYL CITRATE (PF) 100 MCG/2ML IJ SOLN
25.0000 ug | INTRAMUSCULAR | Status: DC | PRN
  Administered 2023-09-27 (×3): 50 ug via INTRAVENOUS

## 2023-09-27 MED ORDER — PHENOL 1.4 % MT LIQD
1.0000 | OROMUCOSAL | Status: DC | PRN
  Administered 2023-09-27: 1 via OROMUCOSAL
  Filled 2023-09-27: qty 177

## 2023-09-27 MED ORDER — PROPOFOL 10 MG/ML IV BOLUS
INTRAVENOUS | Status: AC
  Filled 2023-09-27: qty 20

## 2023-09-27 MED ORDER — FENTANYL CITRATE (PF) 100 MCG/2ML IJ SOLN: 100.0000 ug | Freq: Once | INTRAMUSCULAR | Status: AC

## 2023-09-27 MED ORDER — BUPIVACAINE HCL (PF) 0.5 % IJ SOLN
INTRAMUSCULAR | Status: DC | PRN
  Administered 2023-09-27: 10 mL

## 2023-09-27 MED ORDER — LACTATED RINGERS IV SOLN: INTRAVENOUS | Status: DC

## 2023-09-27 MED ORDER — HYDROMORPHONE HCL 1 MG/ML IJ SOLN
INTRAMUSCULAR | Status: AC
  Filled 2023-09-27: qty 1

## 2023-09-27 MED ORDER — SUGAMMADEX SODIUM 200 MG/2ML IV SOLN
INTRAVENOUS | Status: DC | PRN
  Administered 2023-09-27: 205 mg via INTRAVENOUS

## 2023-09-27 MED ORDER — MIDAZOLAM HCL 2 MG/2ML IJ SOLN
INTRAMUSCULAR | Status: AC
  Filled 2023-09-27: qty 2

## 2023-09-27 MED ORDER — MIDAZOLAM HCL 2 MG/2ML IJ SOLN
INTRAMUSCULAR | Status: AC
Start: 2023-09-27 — End: ?
  Filled 2023-09-27: qty 2

## 2023-09-27 MED ORDER — MIDAZOLAM HCL 2 MG/2ML IJ SOLN: 2.0000 mg | Freq: Once | INTRAMUSCULAR | Status: AC

## 2023-09-27 MED ORDER — ROCURONIUM BROMIDE 10 MG/ML (PF) SYRINGE
PREFILLED_SYRINGE | INTRAVENOUS | Status: DC | PRN
  Administered 2023-09-27: 60 mg via INTRAVENOUS
  Administered 2023-09-27: 40 mg via INTRAVENOUS

## 2023-09-27 MED ORDER — CHLORHEXIDINE GLUCONATE 0.12 % MT SOLN: 15.0000 mL | Freq: Once | OROMUCOSAL | Status: DC

## 2023-09-27 MED ORDER — MENTHOL 3 MG MT LOZG
1.0000 | LOZENGE | OROMUCOSAL | Status: DC | PRN
  Filled 2023-09-27: qty 9

## 2023-09-27 MED ORDER — LURASIDONE HCL 20 MG PO TABS
60.0000 mg | ORAL_TABLET | Freq: Every day | ORAL | Status: DC
  Filled 2023-09-27 (×2): qty 3

## 2023-09-27 MED ORDER — DEXAMETHASONE SODIUM PHOSPHATE 10 MG/ML IJ SOLN
INTRAMUSCULAR | Status: AC
  Filled 2023-09-27: qty 1

## 2023-09-27 MED ORDER — PHENYLEPHRINE HCL-NACL 20-0.9 MG/250ML-% IV SOLN
INTRAVENOUS | Status: DC | PRN
  Administered 2023-09-27: 50 ug/min via INTRAVENOUS
  Administered 2023-09-27: 30 ug/min via INTRAVENOUS

## 2023-09-27 MED ORDER — SODIUM CHLORIDE 0.9 % IV SOLN
12.5000 mg | Freq: Four times a day (QID) | INTRAVENOUS | Status: DC | PRN
  Administered 2023-09-27 – 2023-09-28 (×2): 12.5 mg via INTRAVENOUS
  Filled 2023-09-27 (×2): qty 12.5

## 2023-09-27 MED ORDER — SODIUM CHLORIDE (PF) 0.9 % IJ SOLN
INTRAMUSCULAR | Status: DC | PRN
  Administered 2023-09-27: 15 mL

## 2023-09-27 MED ORDER — DEXAMETHASONE SODIUM PHOSPHATE 10 MG/ML IJ SOLN
INTRAMUSCULAR | Status: DC | PRN
  Administered 2023-09-27: 10 mg via INTRAVENOUS

## 2023-09-27 MED ORDER — PANTOPRAZOLE SODIUM 40 MG PO TBEC
40.0000 mg | DELAYED_RELEASE_TABLET | Freq: Every day | ORAL | Status: DC
  Administered 2023-09-27: 40 mg via ORAL
  Filled 2023-09-27: qty 1

## 2023-09-27 MED ORDER — FENTANYL CITRATE (PF) 250 MCG/5ML IJ SOLN
INTRAMUSCULAR | Status: AC
  Filled 2023-09-27: qty 5

## 2023-09-27 MED ORDER — BUPIVACAINE LIPOSOME 1.3 % IJ SUSP
INTRAMUSCULAR | Status: DC | PRN
  Administered 2023-09-27: 10 mL

## 2023-09-27 MED ORDER — ONDANSETRON HCL 4 MG/2ML IJ SOLN
INTRAMUSCULAR | Status: AC
  Filled 2023-09-27: qty 2

## 2023-09-27 MED ORDER — DEXMEDETOMIDINE HCL IN NACL 80 MCG/20ML IV SOLN
INTRAVENOUS | Status: AC
  Filled 2023-09-27: qty 20

## 2023-09-27 MED ORDER — METOCLOPRAMIDE HCL 5 MG PO TABS
5.0000 mg | ORAL_TABLET | Freq: Three times a day (TID) | ORAL | Status: DC | PRN
  Filled 2023-09-27: qty 2

## 2023-09-27 MED ORDER — ACETAMINOPHEN 500 MG PO TABS
1000.0000 mg | ORAL_TABLET | Freq: Four times a day (QID) | ORAL | Status: DC
  Administered 2023-09-27 – 2023-09-28 (×2): 1000 mg via ORAL
  Filled 2023-09-27 (×2): qty 2

## 2023-09-27 MED ORDER — ORAL CARE MOUTH RINSE: 15.0000 mL | Freq: Once | OROMUCOSAL | Status: DC

## 2023-09-27 MED ORDER — PHENYLEPHRINE 80 MCG/ML (10ML) SYRINGE FOR IV PUSH (FOR BLOOD PRESSURE SUPPORT)
PREFILLED_SYRINGE | INTRAVENOUS | Status: DC | PRN
  Administered 2023-09-27: 80 ug via INTRAVENOUS

## 2023-09-27 MED ORDER — DOCUSATE SODIUM 100 MG PO CAPS
100.0000 mg | ORAL_CAPSULE | Freq: Two times a day (BID) | ORAL | Status: DC
  Administered 2023-09-27: 100 mg via ORAL
  Filled 2023-09-27: qty 1

## 2023-09-27 MED ORDER — METHOCARBAMOL 500 MG PO TABS
500.0000 mg | ORAL_TABLET | Freq: Four times a day (QID) | ORAL | Status: DC | PRN
  Administered 2023-09-28: 500 mg via ORAL
  Filled 2023-09-27 (×2): qty 1

## 2023-09-27 MED ORDER — EPINEPHRINE PF 1 MG/ML IJ SOLN
INTRAMUSCULAR | Status: AC
  Filled 2023-09-27: qty 3

## 2023-09-27 MED ORDER — CEFAZOLIN SODIUM-DEXTROSE 2-4 GM/100ML-% IV SOLN
2.0000 g | Freq: Three times a day (TID) | INTRAVENOUS | Status: AC
  Administered 2023-09-27 – 2023-09-28 (×2): 2 g via INTRAVENOUS
  Filled 2023-09-27 (×2): qty 100

## 2023-09-27 MED ORDER — PROPOFOL 10 MG/ML IV BOLUS
INTRAVENOUS | Status: DC | PRN
  Administered 2023-09-27: 200 mg via INTRAVENOUS

## 2023-09-27 MED ORDER — CYCLOBENZAPRINE HCL 5 MG PO TABS
5.0000 mg | ORAL_TABLET | Freq: Three times a day (TID) | ORAL | Status: DC
  Administered 2023-09-27 – 2023-09-28 (×3): 5 mg via ORAL
  Filled 2023-09-27 (×3): qty 1

## 2023-09-27 MED ORDER — OXYCODONE HCL 5 MG PO TABS: 5.0000 mg | ORAL_TABLET | Freq: Once | ORAL | Status: DC | PRN

## 2023-09-27 MED ORDER — PROMETHAZINE (PHENERGAN) 6.25MG IN NS 50ML IVPB: 6.2500 mg | Freq: Once | INTRAVENOUS | Status: AC

## 2023-09-27 MED ORDER — FENTANYL CITRATE (PF) 100 MCG/2ML IJ SOLN
INTRAMUSCULAR | Status: AC
  Administered 2023-09-27: 100 ug via INTRAVENOUS
  Filled 2023-09-27: qty 2

## 2023-09-27 MED ORDER — MIDAZOLAM HCL 2 MG/2ML IJ SOLN
INTRAMUSCULAR | Status: DC | PRN
  Administered 2023-09-27 (×2): 2 mg via INTRAVENOUS

## 2023-09-27 MED ORDER — ACETAMINOPHEN 10 MG/ML IV SOLN
INTRAVENOUS | Status: AC
  Filled 2023-09-27: qty 100

## 2023-09-27 MED ORDER — FENTANYL CITRATE (PF) 100 MCG/2ML IJ SOLN
INTRAMUSCULAR | Status: AC
  Filled 2023-09-27: qty 2

## 2023-09-27 MED ORDER — HYDROMORPHONE HCL 1 MG/ML IJ SOLN
0.5000 mg | INTRAMUSCULAR | Status: DC | PRN
  Administered 2023-09-27 – 2023-09-28 (×3): 0.5 mg via INTRAVENOUS
  Filled 2023-09-27 (×3): qty 0.5

## 2023-09-27 MED ORDER — ACETAMINOPHEN 10 MG/ML IV SOLN
1000.0000 mg | Freq: Once | INTRAVENOUS | Status: DC | PRN
  Administered 2023-09-27: 1000 mg via INTRAVENOUS

## 2023-09-27 MED ORDER — ACETAMINOPHEN 325 MG PO TABS: 325.0000 mg | ORAL_TABLET | Freq: Four times a day (QID) | ORAL | Status: DC | PRN

## 2023-09-27 MED ORDER — EPINEPHRINE PF 1 MG/ML IJ SOLN
INTRAMUSCULAR | Status: DC | PRN
  Administered 2023-09-27: .15 mL

## 2023-09-27 MED ORDER — ESCITALOPRAM OXALATE 20 MG PO TABS: 30.0000 mg | ORAL_TABLET | Freq: Every day | ORAL | Status: DC

## 2023-09-27 MED ORDER — HYDROMORPHONE HCL 1 MG/ML IJ SOLN
INTRAMUSCULAR | Status: AC
  Filled 2023-09-27: qty 0.5

## 2023-09-27 MED ORDER — OXYCODONE HCL 5 MG/5ML PO SOLN: 5.0000 mg | Freq: Once | ORAL | Status: DC | PRN

## 2023-09-27 MED ORDER — SODIUM CHLORIDE 0.9 % IR SOLN
Status: DC | PRN
  Administered 2023-09-27: 3000 mL

## 2023-09-27 MED ORDER — FENTANYL CITRATE (PF) 250 MCG/5ML IJ SOLN
INTRAMUSCULAR | Status: DC | PRN
  Administered 2023-09-27: 150 ug via INTRAVENOUS

## 2023-09-27 MED ORDER — SODIUM CHLORIDE 0.9 % IR SOLN
Status: DC | PRN
  Administered 2023-09-27 (×2): 3000 mL

## 2023-09-27 MED ORDER — MIDAZOLAM HCL 2 MG/2ML IJ SOLN
INTRAMUSCULAR | Status: AC
  Administered 2023-09-27: 2 mg via INTRAVENOUS
  Filled 2023-09-27: qty 2

## 2023-09-27 MED ORDER — CEFAZOLIN SODIUM-DEXTROSE 2-4 GM/100ML-% IV SOLN
2.0000 g | INTRAVENOUS | Status: AC
Start: 2023-09-27 — End: 2023-09-27
  Administered 2023-09-27: 2 g via INTRAVENOUS
  Filled 2023-09-27: qty 100

## 2023-09-27 MED ORDER — TRANEXAMIC ACID-NACL 1000-0.7 MG/100ML-% IV SOLN
1000.0000 mg | INTRAVENOUS | Status: AC
Start: 2023-09-27 — End: 2023-09-27
  Administered 2023-09-27: 1000 mg via INTRAVENOUS
  Filled 2023-09-27: qty 100

## 2023-09-27 MED ORDER — METHOCARBAMOL 1000 MG/10ML IJ SOLN: 500.0000 mg | Freq: Four times a day (QID) | INTRAMUSCULAR | Status: DC | PRN

## 2023-09-27 MED ORDER — LIDOCAINE 2% (20 MG/ML) 5 ML SYRINGE
INTRAMUSCULAR | Status: AC
  Filled 2023-09-27: qty 5

## 2023-09-27 MED ORDER — SODIUM CHLORIDE 0.9 % IV SOLN: 12.5000 mg | INTRAVENOUS | Status: DC

## 2023-09-27 MED ORDER — LORAZEPAM 1 MG PO TABS
1.0000 mg | ORAL_TABLET | Freq: Three times a day (TID) | ORAL | Status: DC | PRN
  Administered 2023-09-27 – 2023-09-28 (×2): 1 mg via ORAL
  Filled 2023-09-27 (×2): qty 1

## 2023-09-27 MED ORDER — HYDROMORPHONE HCL 1 MG/ML IJ SOLN
0.2500 mg | INTRAMUSCULAR | Status: DC | PRN
Start: 2023-09-27 — End: 2023-09-27
  Administered 2023-09-27: 0.5 mg via INTRAVENOUS

## 2023-09-27 SURGICAL SUPPLY — 56 items
BAG COUNTER SPONGE SURGICOUNT (BAG) ×1 IMPLANT
BLADE EXCALIBUR 4.0X13 (MISCELLANEOUS) ×1 IMPLANT
BLADE SURG 11 STRL SS (BLADE) ×1 IMPLANT
COVER SURGICAL LIGHT HANDLE (MISCELLANEOUS) ×1 IMPLANT
DRAPE INCISE IOBAN 66X45 STRL (DRAPES) ×2 IMPLANT
DRAPE STERI 35X30 U-POUCH (DRAPES) ×1 IMPLANT
DRAPE U-SHAPE 47X51 STRL (DRAPES) ×2 IMPLANT
DRSG TEGADERM 4X4.75 (GAUZE/BANDAGES/DRESSINGS) ×4 IMPLANT
DURAPREP 26ML APPLICATOR (WOUND CARE) ×1 IMPLANT
ELECT REM PT RETURN 9FT ADLT (ELECTROSURGICAL) ×1
ELECTRODE REM PT RTRN 9FT ADLT (ELECTROSURGICAL) ×1 IMPLANT
FILTER STRAW FLUID ASPIR (MISCELLANEOUS) ×1 IMPLANT
GAUZE PAD ABD 8X10 STRL (GAUZE/BANDAGES/DRESSINGS) ×3 IMPLANT
GAUZE SPONGE 4X4 12PLY STRL (GAUZE/BANDAGES/DRESSINGS) IMPLANT
GAUZE SPONGE 4X4 12PLY STRL LF (GAUZE/BANDAGES/DRESSINGS) ×1 IMPLANT
GAUZE XEROFORM 1X8 LF (GAUZE/BANDAGES/DRESSINGS) ×1 IMPLANT
GLOVE BIOGEL PI IND STRL 8 (GLOVE) ×1 IMPLANT
GLOVE ECLIPSE 8.0 STRL XLNG CF (GLOVE) ×1 IMPLANT
GOWN STRL REUS W/ TWL LRG LVL3 (GOWN DISPOSABLE) ×3 IMPLANT
KIT BASIN OR (CUSTOM PROCEDURE TRAY) ×1 IMPLANT
KIT TURNOVER KIT B (KITS) ×1 IMPLANT
MANIFOLD NEPTUNE II (INSTRUMENTS) ×1 IMPLANT
NDL HYPO 25X1 1.5 SAFETY (NEEDLE) ×1 IMPLANT
NDL SCORPION MULTI FIRE (NEEDLE) IMPLANT
NDL SPNL 18GX3.5 QUINCKE PK (NEEDLE) ×1 IMPLANT
NDL SUT 6 .5 CRC .975X.05 MAYO (NEEDLE) IMPLANT
NEEDLE HYPO 25X1 1.5 SAFETY (NEEDLE) ×1 IMPLANT
NEEDLE SCORPION MULTI FIRE (NEEDLE) ×1 IMPLANT
NEEDLE SPNL 18GX3.5 QUINCKE PK (NEEDLE) ×1 IMPLANT
NS IRRIG 1000ML POUR BTL (IV SOLUTION) ×1 IMPLANT
PACK SHOULDER (CUSTOM PROCEDURE TRAY) ×1 IMPLANT
PAD ARMBOARD 7.5X6 YLW CONV (MISCELLANEOUS) ×2 IMPLANT
PROBE APOLLO 90XL (SURGICAL WAND) ×1 IMPLANT
RESTRAINT HEAD UNIVERSAL NS (MISCELLANEOUS) ×1 IMPLANT
SLING ARM IMMOBILIZER MED (SOFTGOODS) IMPLANT
SLING ARM IMMOBILIZER XL (CAST SUPPLIES) IMPLANT
SPONGE T-LAP 4X18 ~~LOC~~+RFID (SPONGE) ×2 IMPLANT
STRIP CLOSURE SKIN 1/2X4 (GAUZE/BANDAGES/DRESSINGS) ×1 IMPLANT
SUCTION TUBE FRAZIER 10FR DISP (SUCTIONS) ×1 IMPLANT
SUT ETHILON 3 0 PS 1 (SUTURE) ×1 IMPLANT
SUT FIBERWIRE #2 38 T-5 BLUE (SUTURE)
SUT MNCRL AB 3-0 PS2 18 (SUTURE) ×1 IMPLANT
SUT VIC AB 0 CT1 27XBRD ANBCTR (SUTURE) ×1 IMPLANT
SUT VIC AB 1 CT1 27XBRD ANBCTR (SUTURE) ×1 IMPLANT
SUT VIC AB 2-0 CT1 TAPERPNT 27 (SUTURE) ×1 IMPLANT
SUT VIC AB 3-0 SH 27X BRD (SUTURE) IMPLANT
SUT VIC AB CT1 27XBRD ANBCTRL (SUTURE)
SUT VICRYL 0 UR6 27IN ABS (SUTURE) IMPLANT
SUTURE FIBERWR #2 38 T-5 BLUE (SUTURE) IMPLANT
SYR 20ML LL LF (SYRINGE) ×2 IMPLANT
SYR 3ML LL SCALE MARK (SYRINGE) ×1 IMPLANT
SYR TB 1ML LUER SLIP (SYRINGE) ×1 IMPLANT
TOWEL GREEN STERILE (TOWEL DISPOSABLE) ×1 IMPLANT
TOWEL GREEN STERILE FF (TOWEL DISPOSABLE) ×1 IMPLANT
TUBING ARTHROSCOPY IRRIG 16FT (MISCELLANEOUS) ×1 IMPLANT
WATER STERILE IRR 1000ML POUR (IV SOLUTION) ×1 IMPLANT

## 2023-09-27 NOTE — Brief Op Note (Signed)
   09/27/2023  3:42 PM  PATIENT:  Gina [name]  47 y.o. female  PRE-OPERATIVE DIAGNOSIS:  right shoulder acromioclavicular osteoarthritis, calcific tendinitis, bursitis  POST-OPERATIVE DIAGNOSIS:  right shoulder acromioclavicular osteoarthritis, calcific tendinitis, bursitis  PROCEDURE:  Procedure(s): RIGHT SHOULDER ARTHROSCOPY, SUBACROMIAL bursectomy with  CALCIFIC DEPOSIT DECOMPRESSION, arthroscopic DISTAL CLAVICLE EXCISION  SURGEON:  Surgeon(s): August Saucer, Corrie Mckusick, MD  ASSISTANT: Magnant PA  ANESTHESIA:   General  EBL: 3 ml    Total I/O In: 1600 [I.V.:1500; IV Piggyback:100] Out: -   BLOOD ADMINISTERED: none  DRAINS: None  LOCAL MEDICATIONS USED:  none  SPECIMEN:  No Specimen  COUNTS:  YES  TOURNIQUET:  * No tourniquets in log *  DICTATION: .Other Dictation: Dictation Number [id number]  PLAN OF CARE: Pending goals.  PATIENT DISPOSITION:  PACU - hemodynamically stable

## 2023-09-27 NOTE — Op Note (Unsigned)
NAME: Gina [name], Gina [name] MEDICAL RECORD NO: [id number] ACCOUNT NO: [id number] DATE OF BIRTH: 1976/03/15 FACILITY: [facility] LOCATION: [facility] PHYSICIAN: Graylin Shiver. August Saucer, MD  Operative Report   DATE OF PROCEDURE: 09/27/2023  PREOPERATIVE DIAGNOSIS:  Right shoulder AC joint arthritis and calcific tendinitis.  POSTOPERATIVE DIAGNOSIS:  Right shoulder AC joint arthritis and calcific tendinitis.  PROCEDURE:  Right shoulder arthroscopy with arthroscopic distal clavicle excision and subacromial decompression and bursectomy with decompression of calcific tendinitis.  SURGEON:  Graylin Shiver. August Saucer, MD  ASSISTANT:  [name] Magnet.  INDICATIONS:  The patient is a 47 year old patient with right shoulder pain refractory to nonoperative management who presents for operative management after explanation of risks and benefits.  PROCEDURE IN DETAIL:  The patient was brought to the operating room where a general anesthetic was induced.  Preoperative antibiotics were administered.  Timeout was called.  The patient was placed in the beach chair position with the head in neutral  position.  Right shoulder, arm and hand were then prescrubbed with alcohol and Betadine, allowed to air dry, prepped with DuraPrep solution and draped in a sterile manner.  [name] was used to seal the operative field.  After calling timeout, I examined  the shoulder under anesthesia.  He had range of motion of 65/95/165.  Shoulder stability was good anterior and posterior stress.  After examination under anesthesia, the posterior portal was created 2 cm medial and inferior to the posterior margin of the  acromion.  Diagnostic arthroscopy was performed.  Articular surface was intact.  Minimal to no rotator cuff fraying was present intraarticularly.  Anterior inferior and posterior inferior glenohumeral joints were intact.  Anterior portal was created  under direct visualization in line with the distal end of the clavicle.  Biceps had good mobility.   Next, scope was placed into the subacromial space.  Lateral portal was created.  Bursectomy performed.  At this time, using a spinal needle, single  punctate hole was placed into the area where the calcific tendinitis was present.  Then, the probe was used through this hole to decompress the space.  Thorough decompression was confirmed arthroscopically.  At this time, a bur was then placed through  that anterior portal and the distal 9-10 mm of the clavicle was removed parallel to its joint surface with preservation of the superior and posterior ligaments.  Thorough irrigation of the subacromial space performed.  The instruments were removed and  the portals were closed using 3-0 Vicryl and 3-0 nylon.  Impervious dressing was placed.  [name]'s assistance was required at all times for retraction, opening, closing, and mobilization of tissue.  His assistance was a medical necessity.    VAI D: 09/27/2023 3:48:18 pm T: 09/27/2023 7:26:00 pm  JOB: [id number]/ [id number]

## 2023-09-27 NOTE — Transfer of Care (Signed)
Immediate Anesthesia Transfer of Care Note  Patient: Gina [name]  Procedure(s) Performed: RIGHT SHOULDER ARTHROSCOPY, SUBACROMIAL DECOMPRESSION, CALCIFIC DECOMPRESSION, DISTAL CLAVICLE EXCISION (Right)  Patient Location: PACU  Anesthesia Type:General  Level of Consciousness: drowsy and responds to stimulation  Airway & Oxygen Therapy: Patient Spontanous Breathing and Patient connected to face mask oxygen  Post-op Assessment: Report given to RN and Post -op Vital signs reviewed and stable  Post vital signs: Reviewed and stable  Last Vitals:  Vitals Value Taken Time  BP 92/59 09/27/23 1530  Temp    Pulse 108 09/27/23 1531  Resp 26 09/27/23 1531  SpO2 100 % 09/27/23 1531  Vitals shown include unfiled device data.  Last Pain:  Vitals:   09/27/23 1145  TempSrc:   PainSc: 6          Complications: No notable events documented.

## 2023-09-27 NOTE — Anesthesia Procedure Notes (Addendum)
Procedure Name: Intubation Date/Time: 09/27/2023 1:47 PM  Performed by: Ammie Dalton, CRNAPre-anesthesia Checklist: Patient identified, Emergency Drugs available, Suction available and Patient being monitored Patient Re-evaluated:Patient Re-evaluated prior to induction Oxygen Delivery Method: Circle System Utilized Preoxygenation: Pre-oxygenation with 100% oxygen Induction Type: IV induction Ventilation: Mask ventilation without difficulty Laryngoscope Size: Mac and 3 Grade View: Grade I Tube type: Oral Number of attempts: 1 Airway Equipment and Method: Stylet Placement Confirmation: ETT inserted through vocal cords under direct vision, positive ETCO2 and breath sounds checked- equal and bilateral Secured at: 23 cm Tube secured with: Tape Dental Injury: Teeth and Oropharynx as per pre-operative assessment  Comments: Performed by Murray Hodgkins SRNA

## 2023-09-27 NOTE — H&P (Signed)
Gina [name] is an 47 y.o. female.   Chief Complaint: Right shoulder pain HPI:  Gina [name] is a 47 y.o. female who presents to the office reporting .  Right shoulder pain.  She cannot sleep on the right-hand side but not comfortably.  When she works from overhead to below shoulder level she has significant pain.  Physical therapy has not been helpful.  She uses heat on the shoulder.  She does report having an AC joint injection in the right shoulder which helped but it was only temporary.  Prior injection from the back was not helpful for prolonged period of time.  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  She is currently taking pain medicine from dental work.  Does report both superior and lateral pain in the right shoulder.  Rotator cuff intact on MRI scan.  Symptoms now ongoing for about 4 to 5 months.   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   Eating disorder    ENDOMETRIOSIS 12/16/2009   FATIGUE 06/11/2010   Fibromyalgia    GERD 10/23/2009   Heart palpitations    HERPES SIMPLEX INFECTION 06/11/2010   LENTIGO 04/23/2009   LUNG NODULE 06/17/2010   Narcotic abuse, continuous (HCC)    Ovarian cyst    Palpitations 10/23/2009   Panic disorder    Renal disorder    pt denies   Rheumatoid arthritis(714.0)    Stricture and stenosis of esophagus 05/13/2010   TOBACCO ABUSE 08/07/2008   Urinary tract infection    UTI (urinary tract infection)     Past Surgical History:  Procedure Laterality Date   ABDOMINAL SURGERY      ABDOMINAL WALL MESH  REMOVAL     ABLATION ON ENDOMETRIOSIS     CESAREAN SECTION     x2    DILATION AND CURETTAGE OF UTERUS     x1    ENDOMETRIAL ABLATION     esophageal dilatation x 4     GUM SURGERY     HERNIA REPAIR     X3   PARTIAL HYSTERECTOMY      Family History  Problem Relation Age of Onset   Depression Mother    Arthritis Mother    Hyperlipidemia Father    Hypertension Father    Social History:  reports that she has been smoking cigarettes. She uses smokeless tobacco. She reports that she does not drink alcohol and does not use drugs.  Allergies:  Allergies  Allergen Reactions   Lamictal [Lamotrigine] Anaphylaxis and Other (See Comments)    STEVEN JOHNSON'S SYNDROME   Morphine And Codeine Other (See Comments)    Makes pt "very mean" "I get mean" Makes pt "very mean" Makes pt "very mean"   Nicotine Swelling and Palpitations    Other reaction(s): Respiratory Distress (ALLERGY/intolerance), Swelling (ALLERGY/intolerance), Tachycardia / Palpitations  (intolerance) Patches, lozenges Patches caused palpations lozenges throat swelled   Rizatriptan Anaphylaxis   Zofran Anaphylaxis   Chantix [Varenicline Tartrate] Other (See Comments)     Diaphoresis, sweating.  Night terrors.  "went  crazy"   Ciprofloxacin Diarrhea and Nausea And Vomiting   Codeine Nausea And Vomiting   Haldol [Haloperidol] Itching, Rash and Other (See Comments)    FLUSHING   Ibuprofen Nausea And Vomiting   Lidoderm [Lidocaine] Other (See Comments)    DOESN'T WORK FOR PATIENT   Metaxalone Other (See Comments)    Pt does not remember reaction, REAL BAD REACTION   Sulfa Antibiotics Diarrhea, Other (See Comments) and Nausea And Vomiting    GI PAINS ALSO GI PAINS ALSO    Sulfasalazine Diarrhea    GI PAINS   Toradol [Ketorolac Tromethamine] Nausea And Vomiting   Doxycycline     Other Reaction(s): GI Intolerance   Duloxetine Other (See Comments)    unk   Duloxetine Hcl Other (See Comments)    Gabapentin Nausea And Vomiting   Ingrezza [Valbenazine Tosylate] Hives   Methadone     seizures   Other     Pt states her body does not react well to "the new type of prep" must use betadine   Pregabalin     unk   Zolpidem     Mental status changes   Zolpidem Tartrate Other (See Comments)   Amoxicillin Rash    Has patient had a PCN reaction causing immediate rash, facial/tongue/throat swelling, SOB or lightheadedness with hypotension: No Has patient had a PCN reaction causing severe rash involving mucus membranes or skin necrosis: No Has patient had a PCN reaction that required hospitalization: No Has patient had a PCN reaction occurring within the last 10 years: No If all of the above answers are "NO", then may proceed with Cephalosporin use.    Aripiprazole Anxiety    hallucinations   Chlorhexidine Itching and Rash   Penicillins Rash    Other reaction(s): Urticaria / Hives (ALLERGY) Has patient had a PCN reaction causing immediate rash, facial/tongue/throat swelling, SOB or lightheadedness with hypotension: No Has patient had a PCN reaction causing severe rash involving mucus membranes or skin necrosis: No Has patient had a PCN reaction that required hospitalization: No Has patient had a PCN reaction occurring within the last 10 years: No If all of the above answers are "NO", then may proceed with Cephalosporin use.   Tramadol Rash   Valbenazine Rash    Medications Prior to Admission  Medication Sig Dispense Refill   calcium carbonate (TUMS - DOSED IN MG ELEMENTAL CALCIUM) 500 MG chewable tablet Chew 1 tablet by mouth 3 (three) times daily as needed for indigestion or heartburn.     cyclobenzaprine (FLEXERIL) 5 MG tablet Take 5 mg by mouth 3 (three) times daily.     escitalopram (LEXAPRO) 20 MG tablet Take 30 mg by mouth at bedtime.     HYDROcodone-acetaminophen (NORCO) 10-325 MG tablet Take 1 tablet by mouth every 6 (six) hours as needed.     LORazepam (ATIVAN) 1 MG tablet  Take 1 mg by mouth every 8 (eight) hours as needed for anxiety.     Lurasidone HCl 60 MG TABS Take 60 mg by mouth daily.     omeprazole (PRILOSEC) 40 MG capsule Take 40 mg by mouth daily.     diazepam (VALIUM) 5 MG tablet Take 1 tab at onset of migraine.  May repeat in 2 hrs, if needed.  Max dose: 2 tabs/day. This is a 30 day prescription. (Patient not taking: Reported on 09/20/2023) 3 tablet 0   HYDROcodone-acetaminophen (NORCO/VICODIN) 5-325 MG tablet Take 1 tablet by mouth every 12 (twelve) hours as needed for moderate pain (  pain score 4-6). (Patient not taking: Reported on 09/20/2023) 15 tablet 0   promethazine (PHENERGAN) 25 MG tablet Take 1 tablet (25 mg total) by mouth every 6 (six) hours as needed for up to 15 doses for nausea or vomiting. (Patient not taking: Reported on 09/20/2023) 15 tablet 0   rizatriptan (MAXALT-MLT) 10 MG disintegrating tablet Take 1 tablet (10 mg total) by mouth as needed. May repeat in 2 hours if needed (Patient not taking: Reported on 09/20/2023) 12 tablet 6    No results found for this or any previous visit (from the past 48 hour(s)). No results found.  Review of Systems  Musculoskeletal:  Positive for arthralgias.  All other systems reviewed and are negative.   Blood pressure 90/62, pulse 87, temperature 98.3 F (36.8 C), temperature source Oral, resp. rate 18, height 5\' 7"  (1.702 m), weight 102.5 kg, last menstrual period 01/01/2004, SpO2 98%. Physical Exam Vitals reviewed.  HENT:     Head: Normocephalic.     Nose: Nose normal.     Mouth/Throat:     Mouth: Mucous membranes are moist.  Eyes:     Pupils: Pupils are equal, round, and reactive to light.  Cardiovascular:     Rate and Rhythm: Normal rate.     Pulses: Normal pulses.  Pulmonary:     Effort: Pulmonary effort is normal.  Abdominal:     General: Abdomen is flat.  Musculoskeletal:     Cervical back: Normal range of motion.  Skin:    General: Skin is warm.     Capillary Refill: Capillary  refill takes less than 2 seconds.  Neurological:     General: No focal deficit present.  Psychiatric:        Mood and Affect: Mood normal.    Ortho exam demonstrates range of motion on the right of 70/85/160 range of motion on the left is 70/95/170. Does have asymmetric tenderness to [facility] joint palpation right versus left. Does have pain to resisted abduction on the right compared to the left which she localizes to the lateral deltoid region. No discrete bicipital groove tenderness on the right with negative O'Brien's testing on the right and left. Cervical spine range of motion intact. Rotator cuff strength intact infraspinatus supraspinatus and subscap muscle testing. No coarse grinding or crepitus with internal/external rotation of the arm at 90 degrees of abduction   Assessment/Plan Impression is right shoulder pain with symptomatic calcific tendinitis and symptomatic AC joint arthritis. Both of these have been helped temporarily with injections. Old notes are reviewed today. There is 1 clinic visit missing which demonstrates a repeat injection about 1 week after the prior injection. This is going by patient report. Overall she is symptomatic in the [facility] joint today as well as in that area where there is calcific tendinitis. Operative plan at this time would be arthroscopy with evaluation of the rotator cuff and biceps tendon. Both of those do appear intact on MRI scanning. Plan for arthroscopic distal clavicle excision and localization of that calcific deposit with decompression along with subacromial decompression and bursectomy. The risk and benefits of the procedure are discussed with the patient including not limited to infection nerve vessel damage incomplete pain relief as well as incomplete functional restoration.  Pain medicine management also discussed with the patient.  We will aim to have her wean off pain medicine for 6 weeks postop.  Patient understands risk and benefits and wishes to proceed.  All questions answered.   Burnard Bunting, MD 09/27/2023,  1:18 PM

## 2023-09-27 NOTE — Anesthesia Procedure Notes (Signed)
Anesthesia Regional Block: Interscalene brachial plexus block   Pre-Anesthetic Checklist: , timeout performed,  Correct Patient, Correct Site, Correct Laterality,  Correct Procedure, Correct Position, site marked,  Risks and benefits discussed,  Surgical consent,  Pre-op evaluation,  At surgeon's request and post-op pain management  Laterality: Right  Prep: chloraprep       Needles:  Injection technique: Single-shot  Needle Type: Echogenic Stimulator Needle     Needle Length: 9cm  Needle Gauge: 21     Additional Needles:   Procedures:,,,, ultrasound used (permanent image in chart),,    Narrative:  Start time: 09/27/2023 12:30 PM End time: 09/27/2023 12:35 PM Injection made incrementally with aspirations every 5 mL.  Performed by: Personally  Anesthesiologist: Knott Nation, MD  Additional Notes: Discussed risks and benefits of the nerve block in detail, including but not limited vascular injury, permanent nerve damage and infection.   Patient tolerated the procedure well. Local anesthetic introduced in an incremental fashion under minimal resistance after negative aspirations. No paresthesias were elicited. After completion of the procedure, no acute issues were identified and patient continued to be monitored by RN.

## 2023-09-27 NOTE — Anesthesia Preprocedure Evaluation (Addendum)
Anesthesia Evaluation  Patient identified by MRN, date of birth, ID band Patient awake    Reviewed: Allergy & Precautions, H&P , NPO status , Patient's Chart, lab work & pertinent test results  Airway Mallampati: II  TM Distance: >3 FB Neck ROM: Full    Dental no notable dental hx.    Pulmonary Current Smoker   Pulmonary exam normal breath sounds clear to auscultation       Cardiovascular negative cardio ROS Normal cardiovascular exam Rhythm:Regular Rate:Normal     Neuro/Psych  Headaches PSYCHIATRIC DISORDERS Anxiety Depression Bipolar Disorder    Neuromuscular disease    GI/Hepatic Neg liver ROS,GERD  ,,  Endo/Other  negative endocrine ROS    Renal/GU Renal disease  negative genitourinary   Musculoskeletal  (+) Arthritis ,  Fibromyalgia -  Abdominal   Peds negative pediatric ROS (+)  Hematology negative hematology ROS (+)   Anesthesia Other Findings   Reproductive/Obstetrics negative OB ROS                             Anesthesia Physical Anesthesia Plan  ASA: 3  Anesthesia Plan: General and Regional   Post-op Pain Management: Regional block*   Induction: Intravenous  PONV Risk Score and Plan: 3 and Ondansetron, Dexamethasone, Midazolam and Treatment may vary due to age or medical condition  Airway Management Planned: Oral ETT  Additional Equipment:   Intra-op Plan:   Post-operative Plan: Extubation in OR  Informed Consent: I have reviewed the patients History and Physical, chart, labs and discussed the procedure including the risks, benefits and alternatives for the proposed anesthesia with the patient or authorized representative who has indicated his/her understanding and acceptance.     Dental advisory given  Plan Discussed with: CRNA  Anesthesia Plan Comments:         Anesthesia Quick Evaluation

## 2023-09-28 DIAGNOSIS — M19011 Primary osteoarthritis, right shoulder: Secondary | ICD-10-CM | POA: Diagnosis not present

## 2023-09-28 MED ORDER — OXYCODONE-ACETAMINOPHEN 10-325 MG PO TABS: 0.5000 | ORAL_TABLET | ORAL | 0 refills | Status: DC | PRN

## 2023-09-28 MED ORDER — HYDROMORPHONE HCL 1 MG/ML IJ SOLN
1.0000 mg | Freq: Once | INTRAMUSCULAR | Status: AC
  Administered 2023-09-28: 1 mg via INTRAVENOUS
  Filled 2023-09-28: qty 1

## 2023-09-28 NOTE — Progress Notes (Signed)
Pt requested for more pain medications. Called on call orthopedic. Dr Helene Kelp called back and wanted to order Toradol but pt refused. Will continue to monitor pt pain and treat based on current regimen. Charma Igo, LPN 4:25 AM 95/63/87

## 2023-09-28 NOTE — Evaluation (Signed)
Occupational Therapy Evaluation Patient Details Name: Gina [name] MRN: [id number] DOB: 02/08/1976 Today's Date: 09/28/2023   History of Present Illness Pt is a 47 y/o female presenting on 11/26 for same day R shoulder arthroscopy with decompression of calcific tendinitis and distal clavicle excision. PMH includes: anorexia, anxiety, arthritis, bipolar disorder, endometriosis, fibromyalgia, narcotic abuse, RA, UTI.   Clinical Impression   PTA patient independent and driving. Admitted for above and presents with problem list below.  Patient educated on shoulder precautions, sling mgmt and wear schedule, ADL recommendations, safety, exercises, positioning and edema reduction.  Pt able to complete ADLs with up to min assist and functional mobility/transfers with modified independence. Pt will have support at home as needed, spouse present and verbalizes understanding with education. No further questions or concerns at this time.  OT will sign off acutely, recommend follow up per MD recommendation.       If plan is discharge home, recommend the following: A little help with bathing/dressing/bathroom;Assistance with cooking/housework;Assist for transportation;Help with stairs or ramp for entrance    Functional Status Assessment  Patient has had a recent decline in their functional status and demonstrates the ability to make significant improvements in function in a reasonable and predictable amount of time.  Equipment Recommendations  None recommended by OT    Recommendations for Other Services       Precautions / Restrictions Precautions Precautions: Fall;Shoulder Type of Shoulder Precautions: R shoulder NWB, no shoulder AROM but pendulums are okay (ER 0-30), Okay for elbow/wrist/hand ROM Shoulder Interventions: Shoulder sling/immobilizer;At all times;Off for dressing/bathing/exercises Precaution Booklet Issued: Yes (comment) Precaution Comments: reviewed with pt and  spouse Required Braces or Orthoses: Sling Restrictions Weight Bearing Restrictions: Yes RUE Weight Bearing: Non weight bearing      Mobility Bed Mobility Overal bed mobility: Modified Independent Bed Mobility: Supine to Sit, Sit to Supine     Supine to sit: Modified independent (Device/Increase time) Sit to supine: Modified independent (Device/Increase time)        Transfers Overall transfer level: Modified independent                 General transfer comment: no AD      Balance Overall balance assessment: Needs assistance Sitting-balance support: No upper extremity supported, Feet supported Sitting balance-Leahy Scale: Good     Standing balance support: No upper extremity supported, Single extremity supported, During functional activity Standing balance-Leahy Scale: Good                             ADL either performed or assessed with clinical judgement   ADL Overall ADL's : Needs assistance/impaired     Grooming: Set up;Sitting           Upper Body Dressing : Minimal assistance;Sitting Upper Body Dressing Details (indicate cue type and reason): sling mgmt Lower Body Dressing: Contact guard assist;Sit to/from stand Lower Body Dressing Details (indicate cue type and reason): pt able to manage socks and pants, min guard for safety after cueing for technique Toilet Transfer: Supervision/safety;Ambulation   Toileting- Clothing Manipulation and Hygiene: Supervision/safety;Sit to/from stand       Functional mobility during ADLs: Supervision/safety;Cueing for safety       Vision   Vision Assessment?: No apparent visual deficits     Perception         Praxis         Pertinent Vitals/Pain Pain Assessment Pain Assessment: Faces Faces Pain Scale: Hurts a  little bit Pain Location: R UE with elbow/wrist exercises Pain Descriptors / Indicators: Discomfort, Grimacing, Operative site guarding Pain Intervention(s): Limited activity  within patient's tolerance, Monitored during session, Repositioned     Extremity/Trunk Assessment Upper Extremity Assessment Upper Extremity Assessment: Right hand dominant;RUE deficits/detail RUE Deficits / Details: s/p shoulder surgery, in sling with nerve block intact RUE Sensation: decreased proprioception;decreased light touch RUE Coordination: decreased fine motor;decreased gross motor   Lower Extremity Assessment Lower Extremity Assessment: Defer to PT evaluation   Cervical / Trunk Assessment Cervical / Trunk Assessment: Normal   Communication Communication Communication: No apparent difficulties   Cognition Arousal: Alert Behavior During Therapy: Anxious Overall Cognitive Status: Within Functional Limits for tasks assessed                                 General Comments: some decreased attention but anticipate this is baseline     General Comments  spouse at side and supportive    Exercises Exercises: Shoulder, Other exercises General Exercises - Upper Extremity Elbow Flexion: PROM, Right, 10 reps, Seated Elbow Extension: PROM, Right, 10 reps, Seated Wrist Flexion: AAROM, Right, 10 reps, Seated Wrist Extension: AAROM, 10 reps, Right, Seated Digit Composite Flexion: AROM, Right, 10 reps, Seated Composite Extension: AROM, Right, 10 reps, Seated Other Exercises Other Exercises: R UE supination pronation, x 10 reps PROM   Shoulder Instructions Shoulder Instructions Donning/doffing shirt without moving shoulder: Minimal assistance;Patient able to independently direct caregiver Method for sponge bathing under operated UE: Supervision/safety;Patient able to independently direct caregiver Donning/doffing sling/immobilizer: Maximal assistance;Caregiver independent with task Correct positioning of sling/immobilizer: Supervision/safety;Patient able to independently direct caregiver Pendulum exercises (written home exercise program):  (discussed but not  completed, spouse verbalizes understanding) ROM for elbow, wrist and digits of operated UE: Minimal assistance;Patient able to independently direct caregiver;Caregiver independent with task Sling wearing schedule (on at all times/off for ADL's): Supervision/safety;Patient able to independently direct caregiver Proper positioning of operated UE when showering: Supervision/safety;Patient able to independently direct caregiver Positioning of UE while sleeping: Modified independent;Patient able to independently direct caregiver;Caregiver independent with task    Home Living Family/patient expects to be discharged to:: Private residence Living Arrangements: Spouse/significant other;Children Available Help at Discharge: Family;Available 24 hours/day Type of Home: House Home Access: Stairs to enter [employer] of Steps: 5 Entrance Stairs-Rails: Right;Left Home Layout: One level     Bathroom Shower/Tub: Producer, television/film/video: Standard     Home Equipment: Shower seat          Prior Functioning/Environment Prior Level of Function : Independent/Modified Independent;Driving                        OT Problem List: Decreased strength;Pain;Impaired UE functional use;Decreased knowledge of precautions;Decreased knowledge of use of DME or AE      OT Treatment/Interventions:      OT Goals(Current goals can be found in the care plan section) Acute Rehab OT Goals Patient Stated Goal: home OT Goal Formulation: With patient  OT Frequency:      Co-evaluation              AM-PAC OT "6 Clicks" Daily Activity     Outcome Measure Help from another person eating meals?: A Little Help from another person taking care of personal grooming?: A Little Help from another person toileting, which includes using toliet, bedpan, or urinal?: A Little Help from another person  bathing (including washing, rinsing, drying)?: A Little Help from another person to put on and  taking off regular upper body clothing?: A Little Help from another person to put on and taking off regular lower body clothing?: A Little 6 Click Score: 18   End of Session Equipment Utilized During Treatment: Other (comment) (sling) Nurse Communication: Mobility status  Activity Tolerance: Patient tolerated treatment well Patient left: in bed;with call bell/phone within reach;with nursing/sitter in room  OT Visit Diagnosis: Pain Pain - Right/Left: Right Pain - part of body: Shoulder                Time: 1610-9604 OT Time Calculation (min): 31 min Charges:  OT General Charges $OT Visit: 1 Visit OT Evaluation $OT Eval Low Complexity: 1 Low OT Treatments $Self Care/Home Management : 8-22 mins  Barry Brunner, OT Acute Rehabilitation Services Office [phone]   Chancy Milroy 09/28/2023, 9:59 AM

## 2023-09-28 NOTE — Progress Notes (Signed)
PIV removed. AVS reviewed. Patient and spouse verbalize understanding of medication regimen and necessary follow up appointments

## 2023-09-28 NOTE — Progress Notes (Signed)
  Subjective: Patient is a 47 year old female who is POD 1 s/p right shoulder arthroscopy with decompression of calcific tendinitis and distal clavicle excision.  Doing well overall this morning.  Does have some pain despite the nerve block.  She does have history of opioid use in the past and is on pain medication currently of hydrocodone 10 mg prior to surgery.  She denies any complaints aside from shoulder pain.   Objective: Vital signs in last 24 hours: Temp:  [97 F (36.1 C)-98.5 F (36.9 C)] 98.5 F (36.9 C) (11/27 0507) Pulse Rate:  [78-106] 86 (11/27 0507) Resp:  [12-21] 18 (11/27 0507) BP: (88-123)/(59-103) 105/61 (11/27 0507) SpO2:  [91 %-99 %] 97 % (11/27 0507) Weight:  [102.5 kg] 102.5 kg (11/26 1135)  Intake/Output from previous day: 11/26 0701 - 11/27 0700 In: 2047.5 [I.V.:1500; IV Piggyback:547.5] Out: -  Intake/Output this shift: No intake/output data recorded.  Exam:  Ortho exam demonstrates 2+ radial pulse of the operative extremity.  Intact EPL, FPL, finger abduction.  Grip strength intact.  Bicep flexion and deltoid abduction has not returned yet due to interscalene block.  Dressings are clean dry and intact without any gross blood or drainage.  Labs: No results for input(s): "HGB" in the last 72 hours. No results for input(s): "WBC", "RBC", "HCT", "PLT" in the last 72 hours. No results for input(s): "NA", "K", "CL", "CO2", "BUN", "CREATININE", "GLUCOSE", "CALCIUM" in the last 72 hours. No results for input(s): "LABPT", "INR" in the last 72 hours.  Assessment/Plan: Plan is discharge home today.  Answered all questions about postop recovery.  She will receive pain medication prior to discharge and she will take oxycodone 10 mg every 4 hours due to her history of pain medication.  We will plan to wean her off this back to her normal pain medication over the course of 4 to 6 weeks.  She was encouraged to call the office with any concerns after discharge.   [name]  Vertis Bauder 09/28/2023, 8:07 AM

## 2023-09-29 NOTE — Anesthesia Postprocedure Evaluation (Signed)
Anesthesia Post Note  Patient: Gina [name]  Procedure(s) Performed: RIGHT SHOULDER ARTHROSCOPY, SUBACROMIAL DECOMPRESSION, CALCIFIC DECOMPRESSION, DISTAL CLAVICLE EXCISION (Right)     Patient location during evaluation: PACU Anesthesia Type: General Level of consciousness: awake and alert Pain management: pain level controlled Vital Signs Assessment: post-procedure vital signs reviewed and stable Respiratory status: spontaneous breathing, nonlabored ventilation, respiratory function stable and patient connected to nasal cannula oxygen Cardiovascular status: blood pressure returned to baseline and stable Postop Assessment: no apparent nausea or vomiting Anesthetic complications: no   No notable events documented.  Last Vitals:  Vitals:   09/28/23 0507 09/28/23 0826  BP: 105/61 127/81  Pulse: 86 96  Resp: 18 18  Temp: 36.9 C 36.9 C  SpO2: 97% 97%    Last Pain:  Vitals:   09/28/23 0826  TempSrc: Oral  PainSc:                  Kennieth Rad

## 2023-10-03 ENCOUNTER — Telehealth: Payer: Self-pay | Admitting: Orthopedic Surgery

## 2023-10-03 ENCOUNTER — Telehealth: Payer: Self-pay

## 2023-10-03 ENCOUNTER — Other Ambulatory Visit: Payer: Self-pay | Admitting: Surgical

## 2023-10-03 DIAGNOSIS — M7530 Calcific tendinitis of unspecified shoulder: Secondary | ICD-10-CM

## 2023-10-03 DIAGNOSIS — M7551 Bursitis of right shoulder: Secondary | ICD-10-CM

## 2023-10-03 DIAGNOSIS — M19011 Primary osteoarthritis, right shoulder: Secondary | ICD-10-CM

## 2023-10-03 MED ORDER — OXYCODONE-ACETAMINOPHEN 10-325 MG PO TABS: 0.5000 | ORAL_TABLET | ORAL | 0 refills | Status: DC | PRN

## 2023-10-03 NOTE — Telephone Encounter (Signed)
Patient called stating that she is in a lot of pain with her right shoulder and that the pain medicine is only lasting  long enough until the next dose.  Stated that she spends most of the day using the ice machine and doing exercises.  Patient had right shoulder surgery on 09/27/2023.  CB# [phone].  Please advise.  Thank you.

## 2023-10-03 NOTE — Telephone Encounter (Signed)
Lvm advising  

## 2023-10-03 NOTE — Telephone Encounter (Signed)
Refilled. Going to reduce dosing with each refill following this with goal to be off opioid medication by 6 weeks postop

## 2023-10-03 NOTE — Telephone Encounter (Signed)
Patient called. She would like oxycodone called in for her. Her cb# [phone]

## 2023-10-04 NOTE — Telephone Encounter (Signed)
I'd say this pain level is expected for the procedure she had and her past history of using pain medication prior to surgery.  If she would like, we could try an NSAID like Ibuprofen or celebrex in addition to some Gabapentin to see if that will help her postop pain. Her chart says she has nausea/vomiting as a side-effect to these medications but we could try it if she would like.  Has appointment tomorrow and we can check everything out as well

## 2023-10-04 NOTE — Telephone Encounter (Signed)
Called pt. She can't take nsaids and is icing. Feels like something isnt right. She is looking forward to appt tomorrow

## 2023-10-05 ENCOUNTER — Encounter: Payer: Self-pay | Admitting: Surgical

## 2023-10-05 ENCOUNTER — Ambulatory Visit (INDEPENDENT_AMBULATORY_CARE_PROVIDER_SITE_OTHER): Payer: Medicare Other | Admitting: Surgical

## 2023-10-05 DIAGNOSIS — M7531 Calcific tendinitis of right shoulder: Secondary | ICD-10-CM

## 2023-10-05 DIAGNOSIS — M19011 Primary osteoarthritis, right shoulder: Secondary | ICD-10-CM

## 2023-10-05 NOTE — Progress Notes (Signed)
Post-Op Visit Note   Patient: Gina [name]           Date of Birth: [birth year]           MRN: [id number] Visit Date: 10/05/2023 PCP: Charolett Bumpers, PA-C   Assessment & Plan:  Chief Complaint:  Chief Complaint  Patient presents with   Right Shoulder - Follow-up    Right shoulder scope 09/27/2023   Visit Diagnoses:  1. Arthritis of right acromioclavicular joint   2. Calcific tendinitis of right shoulder     Plan: Patient is a 47 year old female who presents s/p right shoulder arthroscopy with debridement of calcific tendinitis and distal clavicle excision on 09/27/2023.  She states she has severe pain.  Taking oxycodone 10 mg about every 4 hours.  Also using sling and ice machine.  Cannot sleep except for about 2 to 3 hours at a time and she is fairly exhausted.  In addition to the shoulder pain she also notes she has a red, pruritic rash under her underarm and on the underside of her right breast.  Cannot take NSAIDs or gabapentin due to severe nausea vomiting as a side effect from these medications.  On exam, patient has intact EPL, FPL, finger abduction, pronation/supination, bicep, tricep, deltoid.  Axillary nerve is intact with deltoid firing.  Incisions are intact with sutures removed and replaced Steri-Strips today.  No evidence of infection or dehiscence.  Range of motion deferred to next visit due to patient's pain level.  She has 2+ radial pulse of the operative extremity.  Plan is to start to use the CPM machine as much as she can tolerate though this will likely be under the recommended 3 hour/day timeframe.  Recommended that she leave the CPM on the majority of the day to help open up her armpit and this should help with the rash.  Only other options for pain control would really be topical medication so she will try topical Voltaren gel and lidocaine patches for both her right and left shoulders.  We will see her back in 2 weeks for clinical recheck.  Regarding  her left shoulder, she does note increased pain in the left shoulder since her right shoulder surgery.  She feels that the left shoulder "feels like the right shoulder did before surgery".  Denies any history of injury.  Has not had any imaging on the shoulder.  She has tenderness on exam localizing to the [facility] joint with no tenderness over the bicipital groove.  Rotator cuff strength is intact rated 5/5 of supra, infra, subscap.  Axillary nerve intact with deltoid firing.  She has full active and passive range of motion of the left shoulder though terminal forward elevation does reproduce pain localizing to the [facility] joint.  She will try topical Voltaren gel and lidocaine patches for this and we will see how this does for her at her next appointment.  If she has continued pain, recommend left shoulder x-rays at her next appointment.  Follow-Up Instructions: Return in about 2 weeks (around 10/19/2023).   Orders:  No orders of the defined types were placed in this encounter.  No orders of the defined types were placed in this encounter.   Imaging: No results found.  PMFS History: Patient Active Problem List   Diagnosis Date Noted   Calcific supraspinatus tendonitis 10/03/2023   Bursitis of right shoulder 10/03/2023   Arthritis of right acromioclavicular joint 10/03/2023   S/P arthroscopy of right shoulder 09/27/2023   Persistent  headaches 01/18/2023   Blurry vision 01/18/2023   Weight gain 01/18/2023   SBO (small bowel obstruction) (HCC) 09/13/2022   Nausea and vomiting 09/11/2022   Partial small bowel obstruction (HCC) 09/10/2022   Piriformis syndrome of both sides 05/31/2019   History of bipolar disorder 04/18/2019   Tardive dyskinesia 04/18/2019   Urticaria due to drug allergy 04/18/2019   Lumbar spondylosis 02/05/2019   Bipolar affective disorder, current episode mixed (HCC) 01/02/2019   Arthritis 01/02/2019   Cigarette nicotine dependence without complication 01/02/2019   Bilateral  temporomandibular joint pain 10/02/2018   Referred otalgia, bilateral 10/02/2018   Paresthesias 03/09/2018   Chronic migraine without aura, intractable, with status migrainosus 04/22/2016   Bipolar 1 disorder, mixed, moderate (HCC) 07/15/2015    Class: Chronic   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   Sciatica 01/09/2015   Reactive hypoglycemia 12/17/2013   Musculoskeletal malfunction arising from mental factors 04/04/2013   Incisional hernia 11/15/2012   Other postprocedural status(V45.89) 11/15/2012   S/P hernia surgery 11/15/2012   Abdominal pain 11/11/2012   Chronic pain syndrome 11/11/2012   Disorder of sacrum 11/11/2012   Chronic pain 12/21/2011   Heartburn 10/04/2011   DYSPHAGIA 10/04/2011   Depression with anxiety 09/15/2010   CONSTIPATION 09/15/2010   LUNG NODULE 06/17/2010   Herpes simplex virus (HSV) infection 06/11/2010   FATIGUE 06/11/2010   STRICTURE AND STENOSIS OF ESOPHAGUS 05/13/2010   ABDOMINAL WALL HERNIA 02/27/2010   ENDOMETRIOSIS 12/16/2009   GERD 10/23/2009   PALPITATIONS 10/23/2009   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   DYSTHYMIC DISORDER 06/20/2009   CONDYLOMA ACUMINATUM 04/23/2009   LENTIGO 04/23/2009   DENTAL PAIN 03/19/2009   CERVICAL RADICULOPATHY 12/11/2008   Cervical radiculopathy 12/11/2008   PANIC DISORDER WITHOUT AGORAPHOBIA 08/07/2008   TOBACCO ABUSE 08/07/2008   BACK PAIN, THORACIC REGION 07/07/2007   ALLERGIC RHINITIS 06/02/2007   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   Eating disorder    ENDOMETRIOSIS 12/16/2009   FATIGUE  06/11/2010   Fibromyalgia    GERD 10/23/2009   Heart palpitations    HERPES SIMPLEX INFECTION 06/11/2010   LENTIGO 04/23/2009   LUNG NODULE 06/17/2010   Narcotic abuse, continuous (HCC)    Ovarian cyst    Palpitations 10/23/2009   Panic disorder    Renal disorder    pt denies   Rheumatoid arthritis(714.0)    Stricture and stenosis of esophagus 05/13/2010   TOBACCO ABUSE 08/07/2008   Urinary tract infection    UTI (urinary tract infection)     Family History  Problem Relation Age of Onset   Depression Mother    Arthritis Mother    Hyperlipidemia Father    Hypertension Father     Past Surgical History:  Procedure Laterality Date   ABDOMINAL SURGERY     ABDOMINAL WALL MESH  REMOVAL     ABLATION ON ENDOMETRIOSIS     CESAREAN SECTION     x2    DILATION AND CURETTAGE OF UTERUS     x1    ENDOMETRIAL ABLATION     esophageal dilatation x 4     GUM  SURGERY     HERNIA REPAIR     X3   PARTIAL HYSTERECTOMY     Social History   Occupational History   Not on file  Tobacco Use   Smoking status: Every Day    Current packs/day: 0.20    Types: Cigarettes   Smokeless tobacco: Current  Vaping Use   Vaping status: Every Day  Substance and Sexual Activity   Alcohol use: No   Drug use: No   Sexual activity: Yes    Birth control/protection: Surgical

## 2023-10-06 ENCOUNTER — Telehealth: Payer: Self-pay | Admitting: Surgical

## 2023-10-06 NOTE — Telephone Encounter (Signed)
Patient called advised the pain medication is not showing received at the pharmacy. Patient asked for a call when the medicine is sent. The number to contact patient is [phone]

## 2023-10-06 NOTE — Discharge Summary (Signed)
Physician Discharge Summary      Patient ID: Gina [name] MRN: [id number] DOB/AGE: September 15, 1976 47 y.o.  Admit date: 09/27/2023 Discharge date: 09/28/2023  Admission Diagnoses:  Principal Problem:   S/P arthroscopy of right shoulder Active Problems:   Calcific supraspinatus tendonitis   Bursitis of right shoulder   Arthritis of right acromioclavicular joint   Discharge Diagnoses:  Same  Surgeries: Procedure(s): RIGHT SHOULDER ARTHROSCOPY, SUBACROMIAL DECOMPRESSION, CALCIFIC DECOMPRESSION, DISTAL CLAVICLE EXCISION on 09/27/2023   Consultants:   Discharged Condition: Stable  Hospital Course: Gina [name] is an 47 y.o. female who was admitted 09/27/2023 with a chief complaint of right shoulder pain, and found to have a diagnosis of right shoulder calcific tendinitis and AC joint arthritis.  They were brought to the operating room on 09/27/2023 and underwent the above named procedures.  Pt awoke from anesthesia without complication and was transferred to the floor. On POD1, patient's pain was overall controlled with interscalene block still in effect.  No red flag signs or symptoms throughout her stay.  Discharged home on POD 1..  Pt will f/u with Dr. August Saucer in clinic in ~1 weeks.   Antibiotics given:  Anti-infectives (From admission, onward)    Start     Dose/Rate Route Frequency Ordered Stop   09/27/23 2200  ceFAZolin (ANCEF) IVPB 2g/100 mL premix        2 g 200 mL/hr over 30 Minutes Intravenous Every 8 hours 09/27/23 1728 09/28/23 0723   09/27/23 1130  ceFAZolin (ANCEF) IVPB 2g/100 mL premix        2 g 200 mL/hr over 30 Minutes Intravenous On call to O.R. 09/27/23 1127 09/27/23 1348     .  Recent vital signs:  Vitals:   09/28/23 0507 09/28/23 0826  BP: 105/61 127/81  Pulse: 86 96  Resp: 18 18  Temp: 98.5 F (36.9 C) 98.5 F (36.9 C)  SpO2: 97% 97%    Recent laboratory studies:  Results for orders placed or performed during the hospital encounter of  09/22/23  CBC  Result Value Ref Range   WBC 8.6 4.0 - 10.5 K/uL   RBC 4.80 3.87 - 5.11 MIL/uL   Hemoglobin 14.7 12.0 - 15.0 g/dL   HCT 47.4 25.9 - 56.3 %   MCV 93.3 80.0 - 100.0 fL   MCH 30.6 26.0 - 34.0 pg   MCHC 32.8 30.0 - 36.0 g/dL   RDW 87.5 64.3 - 32.9 %   Platelets 230 150 - 400 K/uL   nRBC 0.0 0.0 - 0.2 %  Basic metabolic panel  Result Value Ref Range   Sodium 137 135 - 145 mmol/L   Potassium 4.0 3.5 - 5.1 mmol/L   Chloride 108 98 - 111 mmol/L   CO2 23 22 - 32 mmol/L   Glucose, Bld 85 70 - 99 mg/dL   BUN 8 6 - 20 mg/dL   Creatinine, Ser 5.18 0.44 - 1.00 mg/dL   Calcium 9.0 8.9 - 84.1 mg/dL   GFR, Estimated >66 >06 mL/min   Anion gap 6 5 - 15    Discharge Medications:   Allergies as of 09/28/2023       Reactions   Lamictal [lamotrigine] Anaphylaxis, Other (See Comments)   STEVEN JOHNSON'S SYNDROME   Morphine And Codeine Other (See Comments)   Makes pt "very mean" "I get mean" Makes pt "very mean" Makes pt "very mean"   Nicotine Swelling, Palpitations   Other reaction(s): Respiratory Distress (ALLERGY/intolerance), Swelling (ALLERGY/intolerance), Tachycardia / Palpitations  (intolerance)  Patches, lozenges Patches caused palpations lozenges throat swelled   Rizatriptan Anaphylaxis   Zofran Anaphylaxis   Chantix [varenicline Tartrate] Other (See Comments)    Diaphoresis, sweating.  Night terrors.  "went crazy"   Ciprofloxacin Diarrhea, Nausea And Vomiting   Codeine Nausea And Vomiting   Haldol [haloperidol] Itching, Rash, Other (See Comments)   FLUSHING   Ibuprofen Nausea And Vomiting   Lidoderm [lidocaine] Other (See Comments)   DOESN'T WORK FOR PATIENT   Metaxalone Other (See Comments)   Pt does not remember reaction, REAL BAD REACTION   Sulfa Antibiotics Diarrhea, Other (See Comments), Nausea And Vomiting   GI PAINS ALSO GI PAINS ALSO   Sulfasalazine Diarrhea   GI PAINS   Toradol [ketorolac Tromethamine] Nausea And Vomiting   Doxycycline    Other  Reaction(s): GI Intolerance   Duloxetine Other (See Comments)   unk   Duloxetine Hcl Other (See Comments)   Gabapentin Nausea And Vomiting   Ingrezza [valbenazine Tosylate] Hives   Methadone    seizures   Other    Pt states her body does not react well to "the new type of prep" must use betadine   Pregabalin    unk   Zolpidem    Mental status changes   Zolpidem Tartrate Other (See Comments)   Amoxicillin Rash   Has patient had a PCN reaction causing immediate rash, facial/tongue/throat swelling, SOB or lightheadedness with hypotension: No Has patient had a PCN reaction causing severe rash involving mucus membranes or skin necrosis: No Has patient had a PCN reaction that required hospitalization: No Has patient had a PCN reaction occurring within the last 10 years: No If all of the above answers are "NO", then may proceed with Cephalosporin use.   Aripiprazole Anxiety   hallucinations   Chlorhexidine Itching, Rash   Penicillins Rash   Other reaction(s): Urticaria / Hives (ALLERGY) Has patient had a PCN reaction causing immediate rash, facial/tongue/throat swelling, SOB or lightheadedness with hypotension: No Has patient had a PCN reaction causing severe rash involving mucus membranes or skin necrosis: No Has patient had a PCN reaction that required hospitalization: No Has patient had a PCN reaction occurring within the last 10 years: No If all of the above answers are "NO", then may proceed with Cephalosporin use.   Tramadol Rash   Valbenazine Rash        Medication List     STOP taking these medications    HYDROcodone-acetaminophen 10-325 MG tablet Commonly known as: NORCO   HYDROcodone-acetaminophen 5-325 MG tablet Commonly known as: NORCO/VICODIN       TAKE these medications    calcium carbonate 500 MG chewable tablet Commonly known as: TUMS - dosed in mg elemental calcium Chew 1 tablet by mouth 3 (three) times daily as needed for indigestion or heartburn.    cyclobenzaprine 5 MG tablet Commonly known as: FLEXERIL Take 5 mg by mouth 3 (three) times daily.   diazepam 5 MG tablet Commonly known as: Valium Take 1 tab at onset of migraine.  May repeat in 2 hrs, if needed.  Max dose: 2 tabs/day. This is a 30 day prescription.   escitalopram 20 MG tablet Commonly known as: LEXAPRO Take 30 mg by mouth at bedtime.   LORazepam 1 MG tablet Commonly known as: ATIVAN Take 1 mg by mouth every 8 (eight) hours as needed for anxiety.   Lurasidone HCl 60 MG Tabs Take 60 mg by mouth daily.   omeprazole 40 MG capsule Commonly known as: PRILOSEC  Take 40 mg by mouth daily.   promethazine 25 MG tablet Commonly known as: PHENERGAN Take 1 tablet (25 mg total) by mouth every 6 (six) hours as needed for up to 15 doses for nausea or vomiting.   rizatriptan 10 MG disintegrating tablet Commonly known as: Maxalt-MLT Take 1 tablet (10 mg total) by mouth as needed. May repeat in 2 hours if needed        Diagnostic Studies: No results found.  Disposition: Discharge disposition: 01-Home or Self Care       Discharge Instructions     Call MD / Call 911   Complete by: As directed    If you experience chest pain or shortness of breath, CALL 911 and be transported to the hospital emergency room.  If you develope a fever above 101 F, pus (white drainage) or increased drainage or redness at the wound, or calf pain, call your surgeon's office.   Constipation Prevention   Complete by: As directed    Drink plenty of fluids.  Prune juice may be helpful.  You may use a stool softener, such as Colace (over the counter) 100 mg twice a day.  Use MiraLax (over the counter) for constipation as needed.   Diet - low sodium heart healthy   Complete by: As directed    Discharge instructions   Complete by: As directed    Starting tomorrow, use the CPM machine 3 times per day for at least one hour each time; increase the degrees of range of motion with each session as  you can tolerate.  No lifting with the operative arm. You may shower, dressings are waterproof.  No submersion (bath, pool, etc). Use the sling at all times, you may take it off for showering and to use the CPM machine.  You may come out of the sling 1-2 times per day to work on straightening your elbow, to avoid elbow stiffness.  Follow-up with Dr. August Saucer or Drema Pry in the clinic at your given appointment date in ~1 week.  Call the office at [phone] with any questions/concerns or send [place] a MyChart message with any non-urgent concerns.   Increase activity slowly as tolerated   Complete by: As directed    Post-operative opioid taper instructions:   Complete by: As directed    POST-OPERATIVE OPIOID TAPER INSTRUCTIONS: It is important to wean off of your opioid medication as soon as possible. If you do not need pain medication after your surgery it is ok to stop day one. Opioids include: Codeine, Hydrocodone(Norco, Vicodin), Oxycodone(Percocet, oxycontin) and hydromorphone amongst others.  Long term and even short term use of opiods can cause: Increased pain response Dependence Constipation Depression Respiratory depression And more.  Withdrawal symptoms can include Flu like symptoms Nausea, vomiting And more Techniques to manage these symptoms Hydrate well Eat regular healthy meals Stay active Use relaxation techniques(deep breathing, meditating, yoga) Do Not substitute Alcohol to help with tapering If you have been on opioids for less than two weeks and do not have pain than it is ok to stop all together.  Plan to wean off of opioids This plan should start within one week post op of your joint replacement. Maintain the same interval or time between taking each dose and first decrease the dose.  Cut the total daily intake of opioids by one tablet each day Next start to increase the time between doses. The last dose that should be eliminated is the evening dose.  Follow-up Information     Charolett Bumpers, PA-C Follow up.   Specialty: Physician Assistant Contact information: [address] [place] [address] [phone]                  Signed: Karenann Cai 10/06/2023, 10:16 AM

## 2023-10-07 ENCOUNTER — Encounter: Payer: Self-pay | Admitting: Orthopedic Surgery

## 2023-10-07 ENCOUNTER — Encounter (HOSPITAL_BASED_OUTPATIENT_CLINIC_OR_DEPARTMENT_OTHER): Payer: Self-pay

## 2023-10-07 ENCOUNTER — Encounter: Payer: Self-pay | Admitting: Surgical

## 2023-10-07 ENCOUNTER — Emergency Department (HOSPITAL_BASED_OUTPATIENT_CLINIC_OR_DEPARTMENT_OTHER)
Admission: EM | Admit: 2023-10-07 | Discharge: 2023-10-08 | Disposition: A | Discharge status: Home / Self Care | Payer: Medicare Other

## 2023-10-07 ENCOUNTER — Telehealth: Payer: Self-pay | Admitting: Orthopedic Surgery

## 2023-10-07 ENCOUNTER — Other Ambulatory Visit: Payer: Self-pay

## 2023-10-07 ENCOUNTER — Emergency Department (HOSPITAL_BASED_OUTPATIENT_CLINIC_OR_DEPARTMENT_OTHER): Payer: Medicare Other | Admitting: Radiology

## 2023-10-07 ENCOUNTER — Other Ambulatory Visit: Payer: Self-pay | Admitting: Surgical

## 2023-10-07 DIAGNOSIS — M25512 Pain in left shoulder: Secondary | ICD-10-CM | POA: Diagnosis present

## 2023-10-07 MED ORDER — PROMETHAZINE HCL 25 MG/ML IJ SOLN
INTRAMUSCULAR | Status: AC
  Filled 2023-10-07: qty 1

## 2023-10-07 MED ORDER — PREDNISONE 20 MG PO TABS: 40.0000 mg | ORAL_TABLET | Freq: Every day | ORAL | 0 refills | Status: AC

## 2023-10-07 MED ORDER — SODIUM CHLORIDE 0.9 % IV SOLN
12.5000 mg | Freq: Once | INTRAVENOUS | Status: AC
  Administered 2023-10-07: 12.5 mg via INTRAVENOUS
  Filled 2023-10-07: qty 0.5

## 2023-10-07 MED ORDER — HYDROMORPHONE HCL 1 MG/ML IJ SOLN
1.0000 mg | Freq: Once | INTRAMUSCULAR | Status: AC
  Administered 2023-10-07: 1 mg via INTRAVENOUS
  Filled 2023-10-07: qty 1

## 2023-10-07 MED ORDER — LORAZEPAM 2 MG/ML IJ SOLN
1.0000 mg | Freq: Once | INTRAMUSCULAR | Status: AC
  Administered 2023-10-07: 1 mg via INTRAVENOUS
  Filled 2023-10-07: qty 1

## 2023-10-07 MED ORDER — OXYCODONE-ACETAMINOPHEN 10-325 MG PO TABS: 0.5000 | ORAL_TABLET | ORAL | 0 refills | Status: DC | PRN

## 2023-10-07 NOTE — ED Triage Notes (Signed)
Pt is 1 week s/p right shoulder surgery, but she is here tonight for left shoulder pain x 3 days without specific injury. She had her post-op appt yesterday with her orthopedist, Dr August Saucer, and he told her that it may be her [facility] joint but did not xray left shoulder since she was there for post-op follow up of right shoulder.

## 2023-10-07 NOTE — Telephone Encounter (Signed)
Patient would like a call from the nurse or [facility]. She would like oxycodone called in for her. Her call back number is [phone]

## 2023-10-07 NOTE — ED Provider Notes (Incomplete)
[place] EMERGENCY DEPARTMENT AT [facility] Provider Note   CSN: [id number] Arrival date & time: 10/07/23  1908     History {Add pertinent medical, surgical, social history, OB history to HPI:1} Chief Complaint  Patient presents with  . Shoulder Pain    Gina [name] is a 47 y.o. female who presents the emergency department complaining of left shoulder pain for the past 3 days.  Patient just had right shoulder surgery and followed up with her orthopedist.  They recommended continuing her prescribed pain medication and following up.  She states the pain has gotten significantly worse.  She has been unable to sleep more than 2 hours at a time due to pain.  States the pain starts in her left shoulder and radiates down the arm.  Has not noticed any swelling.  No fevers.   Shoulder Pain      Home Medications Prior to Admission medications   Medication Sig Start Date End Date Taking? Authorizing Provider  predniSONE (DELTASONE) 20 MG tablet Take 2 tablets (40 mg total) by mouth daily with breakfast for 5 days. 10/07/23 10/12/23 Yes Athea Haley T, PA-C  calcium carbonate (TUMS - DOSED IN MG ELEMENTAL CALCIUM) 500 MG chewable tablet Chew 1 tablet by mouth 3 (three) times daily as needed for indigestion or heartburn.    [provider]  cyclobenzaprine (FLEXERIL) 5 MG tablet Take 5 mg by mouth 3 (three) times daily.    [provider]  diazepam (VALIUM) 5 MG tablet Take 1 tab at onset of migraine.  May repeat in 2 hrs, if needed.  Max dose: 2 tabs/day. This is a 30 day prescription. 01/18/23   Levert Feinstein, MD  escitalopram (LEXAPRO) 20 MG tablet Take 30 mg by mouth at bedtime. 12/20/18   [provider]  LORazepam (ATIVAN) 1 MG tablet Take 1 mg by mouth every 8 (eight) hours as needed for anxiety.    [provider]  Lurasidone HCl 60 MG TABS Take 60 mg by mouth daily. 12/16/22   [provider]  omeprazole (PRILOSEC) 40 MG  capsule Take 40 mg by mouth daily.    [provider]  oxyCODONE-acetaminophen (PERCOCET) 10-325 MG tablet Take 0.5-1 tablets by mouth every 4 (four) hours as needed for pain. 10/07/23 10/06/24  Magnant, Joycie Peek, PA-C  promethazine (PHENERGAN) 25 MG tablet Take 1 tablet (25 mg total) by mouth every 6 (six) hours as needed for up to 15 doses for nausea or vomiting. 04/14/23   Terald Sleeper, MD  rizatriptan (MAXALT-MLT) 10 MG disintegrating tablet Take 1 tablet (10 mg total) by mouth as needed. May repeat in 2 hours if needed 01/18/23   Levert Feinstein, MD      Allergies    Lamictal [lamotrigine], Morphine and codeine, Nicotine, Rizatriptan, Zofran, Chantix [varenicline tartrate], Ciprofloxacin, Codeine, Haldol [haloperidol], Ibuprofen, Lidoderm [lidocaine], Metaxalone, Sulfa antibiotics, Sulfasalazine, Toradol [ketorolac tromethamine], Doxycycline, Duloxetine, Duloxetine hcl, Gabapentin, Ingrezza [valbenazine tosylate], Methadone, Other, Pregabalin, Zolpidem, Zolpidem tartrate, Amoxicillin, Aripiprazole, Chlorhexidine, Penicillins, Tramadol, and Valbenazine    Review of Systems   Review of Systems  Musculoskeletal:  Positive for arthralgias.  All other systems reviewed and are negative.   Physical Exam Updated Vital Signs BP 117/82   Pulse 76   Temp 98.9 F (37.2 C)   Resp 18   LMP 01/01/2004   SpO2 95%  Physical Exam Vitals and nursing note reviewed.  Constitutional:      Appearance: Normal appearance.  HENT:  Head: Normocephalic and atraumatic.  Eyes:     Conjunctiva/sclera: Conjunctivae normal.  Cardiovascular:     Rate and Rhythm: Normal rate and regular rhythm.     Pulses:          Radial pulses are 2+ on the right side and 2+ on the left side.  Pulmonary:     Effort: Pulmonary effort is normal. No respiratory distress.     Breath sounds: Normal breath sounds.  Abdominal:     General: There is no distension.     Palpations: Abdomen is soft.     Tenderness: There  is no abdominal tenderness.  Musculoskeletal:     Comments: Left shoulder bony tenderness to palpation, without deformity.  Compartments of the extremity are soft without swelling.  Skin:    General: Skin is warm and dry.  Neurological:     General: No focal deficit present.     Mental Status: She is alert.  Psychiatric:        Mood and Affect: Mood normal.        Behavior: Behavior normal.     ED Results / Procedures / Treatments   Labs (all labs ordered are listed, but only abnormal results are displayed) Labs Reviewed - No data to display  EKG None  Radiology DG Shoulder Left  Result Date: 10/07/2023 CLINICAL DATA:  Shoulder pain EXAM: LEFT SHOULDER - 2+ VIEW COMPARISON:  None Available. FINDINGS: There is no evidence of fracture or dislocation. There is no evidence of arthropathy or other focal bone abnormality. Soft tissues are unremarkable. IMPRESSION: Negative. Electronically Signed   By: Jasmine Pang M.D.   On: 10/07/2023 20:49    Procedures Procedures  {Document cardiac monitor, telemetry assessment procedure when appropriate:1}  Medications Ordered in ED Medications  HYDROmorphone (DILAUDID) injection 1 mg (1 mg Intravenous Given 10/07/23 2324)  promethazine (PHENERGAN) 12.5 mg in sodium chloride 0.9 % 50 mL IVPB (0 mg Intravenous Stopped 10/07/23 2351)  LORazepam (ATIVAN) injection 1 mg (1 mg Intravenous Given 10/07/23 2350)    ED Course/ Medical Decision Making/ A&P   {   Click here for ABCD2, HEART and other calculatorsREFRESH Note before signing :1}                              Medical Decision Making Amount and/or Complexity of Data Reviewed Radiology: ordered.  Risk Prescription drug management.   This patient is a 47 y.o. female  who presents to the ED for concern of L shoulder pain.    Differential diagnoses prior to evaluation: The emergent differential diagnosis includes, but is not limited to,  fracture, dislocation, septic joint, DVT. This is  not an exhaustive differential.   Past Medical History / Co-morbidities / Social History: Panic disorder, dysthymic disorder, tobacco use, GERD, endometriosis, chronic pain syndrome, bipolar 1 disorder, cervical radiculopathy, calcific supraspinatus tendinitis  Additional history: Chart reviewed. Pertinent results include: Patient s/p right shoulder arthroscopy, subacromial decompression, calcific decompression and distal clavicle excision with Dr. Karsen Nakanishi Picket on 11/26  PDMP reviewed  Physical Exam: Physical exam performed. The pertinent findings include: Normal vital signs, no acute distress.  Generalized tenderness palpation of the left shoulder without overlying skin changes, bony abnormalities, or deformities.  No swelling of the extremity, compartments are soft, neurovascularly intact.  Lab Tests/Imaging studies: I personally interpreted labs/imaging and the pertinent results include: Shoulder x-ray unremarkable.. I agree with the radiologist interpretation.  Medications: I ordered medication including  pain medication, Phenergan, Ativan for anxiety.  I have reviewed the patients home medicines and have made adjustments as needed.   Disposition: After consideration of the diagnostic results and the patients response to treatment, I feel that emergency department workup does not suggest an emergent condition requiring admission or immediate intervention beyond what has been performed at this time. The plan is: Discharge home with ongoing management of left shoulder pain, likely musculoskeletal.  No chest pain or shortness of breath.. The patient is safe for discharge and has been instructed to return immediately for worsening symptoms, change in symptoms or any other concerns.  Final Clinical Impression(s) / ED Diagnoses Final diagnoses:  Acute pain of left shoulder    Rx / DC Orders ED Discharge Orders          Ordered    predniSONE (DELTASONE) 20 MG tablet  Daily with breakfast         10/07/23 2354           Portions of this report may have been transcribed using voice recognition software. Every effort was made to ensure accuracy; however, inadvertent computerized transcription errors may be present.

## 2023-10-07 NOTE — Telephone Encounter (Signed)
Just sent in, thought I did it yesterday but maybe I hit print instead of e-prescribe

## 2023-10-07 NOTE — ED Provider Notes (Signed)
[place] EMERGENCY DEPARTMENT AT [facility] Provider Note   CSN: [id number] Arrival date & time: 10/07/23  5284     History  Chief Complaint  Patient presents with   Shoulder Pain    Gina [name] is a 47 y.o. female who presents the emergency department complaining of left shoulder pain for the past 3 days.  Patient just had right shoulder surgery and followed up with her orthopedist.  They recommended continuing her prescribed pain medication and following up.  She states the pain has gotten significantly worse.  She has been unable to sleep more than 2 hours at a time due to pain.  States the pain starts in her left shoulder and radiates down the arm.  Has not noticed any swelling.  No fevers.   Shoulder Pain      Home Medications Prior to Admission medications   Medication Sig Start Date End Date Taking? Authorizing Provider  predniSONE (DELTASONE) 20 MG tablet Take 2 tablets (40 mg total) by mouth daily with breakfast for 5 days. 10/07/23 10/12/23 Yes Star Resler T, PA-C  calcium carbonate (TUMS - DOSED IN MG ELEMENTAL CALCIUM) 500 MG chewable tablet Chew 1 tablet by mouth 3 (three) times daily as needed for indigestion or heartburn.    [provider]  cyclobenzaprine (FLEXERIL) 5 MG tablet Take 5 mg by mouth 3 (three) times daily.    [provider]  diazepam (VALIUM) 5 MG tablet Take 1 tab at onset of migraine.  May repeat in 2 hrs, if needed.  Max dose: 2 tabs/day. This is a 30 day prescription. 01/18/23   Levert Feinstein, MD  escitalopram (LEXAPRO) 20 MG tablet Take 30 mg by mouth at bedtime. 12/20/18   [provider]  LORazepam (ATIVAN) 1 MG tablet Take 1 mg by mouth every 8 (eight) hours as needed for anxiety.    [provider]  Lurasidone HCl 60 MG TABS Take 60 mg by mouth daily. 12/16/22   [provider]  omeprazole (PRILOSEC) 40 MG capsule Take 40 mg by mouth daily.    [provider]   oxyCODONE-acetaminophen (PERCOCET) 10-325 MG tablet Take 0.5-1 tablets by mouth every 4 (four) hours as needed for pain. 10/07/23 10/06/24  Magnant, Joycie Peek, PA-C  promethazine (PHENERGAN) 25 MG tablet Take 1 tablet (25 mg total) by mouth every 6 (six) hours as needed for up to 15 doses for nausea or vomiting. 04/14/23   Terald Sleeper, MD  rizatriptan (MAXALT-MLT) 10 MG disintegrating tablet Take 1 tablet (10 mg total) by mouth as needed. May repeat in 2 hours if needed 01/18/23   Levert Feinstein, MD      Allergies    Lamictal [lamotrigine], Morphine and codeine, Nicotine, Rizatriptan, Zofran, Chantix [varenicline tartrate], Ciprofloxacin, Codeine, Haldol [haloperidol], Ibuprofen, Lidoderm [lidocaine], Metaxalone, Sulfa antibiotics, Sulfasalazine, Toradol [ketorolac tromethamine], Doxycycline, Duloxetine, Duloxetine hcl, Gabapentin, Ingrezza [valbenazine tosylate], Methadone, Other, Pregabalin, Zolpidem, Zolpidem tartrate, Amoxicillin, Aripiprazole, Chlorhexidine, Penicillins, Tramadol, and Valbenazine    Review of Systems   Review of Systems  Musculoskeletal:  Positive for arthralgias.  All other systems reviewed and are negative.   Physical Exam Updated Vital Signs BP 119/86   Pulse 100   Temp 98.9 F (37.2 C)   Resp 18   LMP 01/01/2004   SpO2 97%  Physical Exam Vitals and nursing note reviewed.  Constitutional:      Appearance: Normal appearance.  HENT:     Head: Normocephalic and atraumatic.  Eyes:  Conjunctiva/sclera: Conjunctivae normal.  Cardiovascular:     Rate and Rhythm: Normal rate and regular rhythm.     Pulses:          Radial pulses are 2+ on the right side and 2+ on the left side.  Pulmonary:     Effort: Pulmonary effort is normal. No respiratory distress.     Breath sounds: Normal breath sounds.  Abdominal:     General: There is no distension.     Palpations: Abdomen is soft.     Tenderness: There is no abdominal tenderness.  Musculoskeletal:     Comments:  Left shoulder bony tenderness to palpation, without deformity.  Compartments of the extremity are soft without swelling.  Skin:    General: Skin is warm and dry.  Neurological:     General: No focal deficit present.     Mental Status: She is alert.  Psychiatric:        Mood and Affect: Mood normal.        Behavior: Behavior normal.     ED Results / Procedures / Treatments   Labs (all labs ordered are listed, but only abnormal results are displayed) Labs Reviewed - No data to display  EKG None  Radiology DG Shoulder Left  Result Date: 10/07/2023 CLINICAL DATA:  Shoulder pain EXAM: LEFT SHOULDER - 2+ VIEW COMPARISON:  None Available. FINDINGS: There is no evidence of fracture or dislocation. There is no evidence of arthropathy or other focal bone abnormality. Soft tissues are unremarkable. IMPRESSION: Negative. Electronically Signed   By: Jasmine Pang M.D.   On: 10/07/2023 20:49    Procedures Procedures    Medications Ordered in ED Medications  HYDROmorphone (DILAUDID) injection 1 mg (1 mg Intravenous Given 10/07/23 2324)  promethazine (PHENERGAN) 12.5 mg in sodium chloride 0.9 % 50 mL IVPB (0 mg Intravenous Stopped 10/07/23 2351)  LORazepam (ATIVAN) injection 1 mg (1 mg Intravenous Given 10/07/23 2350)    ED Course/ Medical Decision Making/ A&P                                 Medical Decision Making Amount and/or Complexity of Data Reviewed Radiology: ordered.  Risk Prescription drug management.   This patient is a 47 y.o. female  who presents to the ED for concern of L shoulder pain.    Differential diagnoses prior to evaluation: The emergent differential diagnosis includes, but is not limited to,  fracture, dislocation, septic joint, DVT. This is not an exhaustive differential.   Past Medical History / Co-morbidities / Social History: Panic disorder, dysthymic disorder, tobacco use, GERD, endometriosis, chronic pain syndrome, bipolar 1 disorder, cervical  radiculopathy, calcific supraspinatus tendinitis  Additional history: Chart reviewed. Pertinent results include: Patient s/p right shoulder arthroscopy, subacromial decompression, calcific decompression and distal clavicle excision with Dr. Crestina Strike Picket on 11/26  PDMP reviewed.  Physical Exam: Physical exam performed. The pertinent findings include: Normal vital signs, no acute distress.  Generalized tenderness palpation of the left shoulder without overlying skin changes, bony abnormalities, or deformities.  No swelling of the extremity, compartments are soft, neurovascularly intact.  Lab Tests/Imaging studies: I personally interpreted labs/imaging and the pertinent results include: Shoulder x-ray unremarkable.. I agree with the radiologist interpretation.  Medications: I ordered medication including pain medication, Phenergan, Ativan for anxiety.  I have reviewed the patients home medicines and have made adjustments as needed.   Disposition: After consideration of the diagnostic results and the  patients response to treatment, I feel that emergency department workup does not suggest an emergent condition requiring admission or immediate intervention beyond what has been performed at this time. The plan is: Discharge home with ongoing management of left shoulder pain, likely musculoskeletal.  No chest pain or shortness of breath. Encouraged orthopedic follow up, will prescribed steroid burst and recommend continuing home pain regimen. The patient is safe for discharge and has been instructed to return immediately for worsening symptoms, change in symptoms or any other concerns.  Final Clinical Impression(s) / ED Diagnoses Final diagnoses:  Acute pain of left shoulder    Rx / DC Orders ED Discharge Orders          Ordered    predniSONE (DELTASONE) 20 MG tablet  Daily with breakfast        10/07/23 2354           Portions of this report may have been transcribed using voice recognition  software. Every effort was made to ensure accuracy; however, inadvertent computerized transcription errors may be present.    Cameron Schwinn T, PA-C 10/08/23 0013    Coral Spikes, DO 10/08/23 0016

## 2023-10-07 NOTE — Discharge Instructions (Signed)
You were seen in the ER for left shoulder pain.  As we discussed, your x-ray looked reassuring. This would not however show inflammation of the tendons or fluid sacs around the joints.  I have prescribed some steroids to help with the inflammation and please continue your other prescribed medications. Please follow up with the orthopedist as well.

## 2023-10-07 NOTE — ED Notes (Signed)
Pt given ice packs per her request

## 2023-10-07 NOTE — Telephone Encounter (Signed)
Message already sent to [facility]

## 2023-10-07 NOTE — Telephone Encounter (Signed)
Double message. Message already sent to [facility]

## 2023-10-13 ENCOUNTER — Telehealth: Payer: Self-pay | Admitting: Surgical

## 2023-10-13 ENCOUNTER — Other Ambulatory Visit: Payer: Self-pay | Admitting: Surgical

## 2023-10-13 MED ORDER — OXYCODONE-ACETAMINOPHEN 10-325 MG PO TABS: 0.5000 | ORAL_TABLET | ORAL | 0 refills | Status: DC | PRN

## 2023-10-13 NOTE — Telephone Encounter (Signed)
refilled 

## 2023-10-13 NOTE — Telephone Encounter (Signed)
Patient called advised she is out of her pain medication  (Oxycodone)   The number to contact patient is [phone]

## 2023-10-17 ENCOUNTER — Other Ambulatory Visit: Payer: Self-pay | Admitting: Radiology

## 2023-10-17 DIAGNOSIS — M25512 Pain in left shoulder: Secondary | ICD-10-CM

## 2023-10-18 ENCOUNTER — Emergency Department (HOSPITAL_BASED_OUTPATIENT_CLINIC_OR_DEPARTMENT_OTHER)
Admission: EM | Admit: 2023-10-18 | Discharge: 2023-10-19 | Disposition: A | Discharge status: Home / Self Care | Payer: Medicare Other | Attending: Emergency Medicine | Admitting: Emergency Medicine

## 2023-10-18 ENCOUNTER — Encounter (HOSPITAL_BASED_OUTPATIENT_CLINIC_OR_DEPARTMENT_OTHER): Payer: Self-pay | Admitting: Emergency Medicine

## 2023-10-18 ENCOUNTER — Telehealth: Payer: Self-pay | Admitting: Surgical

## 2023-10-18 ENCOUNTER — Emergency Department (HOSPITAL_BASED_OUTPATIENT_CLINIC_OR_DEPARTMENT_OTHER): Payer: Medicare Other | Admitting: Radiology

## 2023-10-18 ENCOUNTER — Other Ambulatory Visit: Payer: Self-pay

## 2023-10-18 DIAGNOSIS — M25512 Pain in left shoulder: Secondary | ICD-10-CM | POA: Insufficient documentation

## 2023-10-18 DIAGNOSIS — X501XXA Overexertion from prolonged static or awkward postures, initial encounter: Secondary | ICD-10-CM | POA: Insufficient documentation

## 2023-10-18 MED ORDER — PROMETHAZINE HCL 25 MG/ML IJ SOLN
12.5000 mg | Freq: Once | INTRAMUSCULAR | Status: AC
  Administered 2023-10-18: 12.5 mg via INTRAMUSCULAR
  Filled 2023-10-18: qty 1

## 2023-10-18 MED ORDER — LORAZEPAM 2 MG/ML IJ SOLN
1.0000 mg | Freq: Once | INTRAMUSCULAR | Status: DC
  Filled 2023-10-18: qty 1

## 2023-10-18 MED ORDER — KETAMINE HCL 10 MG/ML IJ SOLN: 30.0000 mg | Freq: Once | INTRAMUSCULAR | Status: DC

## 2023-10-18 MED ORDER — KETAMINE HCL 50 MG/ML IJ SOLN
150.0000 mg | Freq: Once | INTRAMUSCULAR | Status: AC
  Administered 2023-10-18: 150 mg via INTRAMUSCULAR
  Filled 2023-10-18: qty 10

## 2023-10-18 MED ORDER — LORAZEPAM 2 MG/ML IJ SOLN
1.0000 mg | Freq: Once | INTRAMUSCULAR | Status: AC
  Administered 2023-10-18: 1 mg via INTRAMUSCULAR

## 2023-10-18 NOTE — ED Provider Notes (Signed)
[facility] AT [facility] Provider Note   CSN: [id number] Arrival date & time: 10/18/23  6606     History  Chief Complaint  Patient presents with   Shoulder Pain    Christan Currey is a 47 y.o. female history of recent right arthroscopy of shoulder presents left shoulder pain that began today.  Patient was reaching behind her back to project when she felt a pop in her left shoulder and states she has not been able move her shoulder since then.  Patient has had traumas.  Patient states he can still move and feel her hand but that her shoulders and the worst pain of her life.  Patient sees orthopedics tomorrow for follow-up but states that she cannot make it there due to the pain.  Patient is on oxycodone at home states this has not been helping.  Home Medications Prior to Admission medications   Medication Sig Start Date End Date Taking? Authorizing Provider  calcium carbonate (TUMS - DOSED IN MG ELEMENTAL CALCIUM) 500 MG chewable tablet Chew 1 tablet by mouth 3 (three) times daily as needed for indigestion or heartburn.    [provider]  cyclobenzaprine (FLEXERIL) 5 MG tablet Take 5 mg by mouth 3 (three) times daily.    [provider]  diazepam (VALIUM) 5 MG tablet Take 1 tab at onset of migraine.  May repeat in 2 hrs, if needed.  Max dose: 2 tabs/day. This is a 30 day prescription. 01/18/23   Levert Feinstein, MD  escitalopram (LEXAPRO) 20 MG tablet Take 30 mg by mouth at bedtime. 12/20/18   [provider]  LORazepam (ATIVAN) 1 MG tablet Take 1 mg by mouth every 8 (eight) hours as needed for anxiety.    [provider]  Lurasidone HCl 60 MG TABS Take 60 mg by mouth daily. 12/16/22   [provider]  omeprazole (PRILOSEC) 40 MG capsule Take 40 mg by mouth daily.    [provider]  oxyCODONE-acetaminophen (PERCOCET) 10-325 MG tablet Take 0.5-1 tablets by mouth every 4 (four) hours as needed for pain.  10/13/23 10/12/24  Magnant, Joycie Peek, PA-C  promethazine (PHENERGAN) 25 MG tablet Take 1 tablet (25 mg total) by mouth every 6 (six) hours as needed for up to 15 doses for nausea or vomiting. 04/14/23   Terald Sleeper, MD  rizatriptan (MAXALT-MLT) 10 MG disintegrating tablet Take 1 tablet (10 mg total) by mouth as needed. May repeat in 2 hours if needed 01/18/23   Levert Feinstein, MD      Allergies    Lamictal [lamotrigine], Morphine and codeine, Nicotine, Rizatriptan, Zofran, Chantix [varenicline tartrate], Ciprofloxacin, Codeine, Haldol [haloperidol], Ibuprofen, Lidoderm [lidocaine], Metaxalone, Sulfa antibiotics, Sulfasalazine, Toradol [ketorolac tromethamine], Doxycycline, Duloxetine, Duloxetine hcl, Gabapentin, Ingrezza [valbenazine tosylate], Methadone, Other, Prednisone, Pregabalin, Zolpidem, Zolpidem tartrate, Amoxicillin, Aripiprazole, Chlorhexidine, Penicillins, Tramadol, and Valbenazine    Review of Systems   Review of Systems  Physical Exam Updated Vital Signs BP 109/79   Pulse 98   Temp 98.5 F (36.9 C)   Resp 20   LMP 01/01/2004   SpO2 99%  Physical Exam Constitutional:      General: She is not in acute distress. Cardiovascular:     Rate and Rhythm: Normal rate and regular rhythm.     Pulses: Normal pulses.  Musculoskeletal:     Comments: 5/5 left arm elbow flexion/extension, grip; unable to assess shoulder range of motion as patient is unwilling to range shoulder due to pain Generalized  tenderness all around shoulder with no bony step-offs or abnormalities No clavicle tenderness or abnormalities noted No scapular tenderness or abnormalities Soft compartments Pain not out of proportion Unable to fully assess patient's left shoulder as patient was unwilling to participate in exam due to pain  Skin:    General: Skin is warm and dry.     Capillary Refill: Capillary refill takes less than 2 seconds.     Findings: No bruising or erythema.  Neurological:     Mental Status:  She is alert.     Comments: Sensation intact distally     ED Results / Procedures / Treatments   Labs (all labs ordered are listed, but only abnormal results are displayed) Labs Reviewed - No data to display  EKG None  Radiology DG Shoulder Left Result Date: 10/18/2023 CLINICAL DATA:  Pain. Pop sound. Something popped when putting jacket on. EXAM: LEFT SHOULDER - 2+ VIEW COMPARISON:  Left shoulder radiographs 10/07/2023 FINDINGS: Normal bone mineralization. No significant joint space narrowing of the glenohumeral or acromioclavicular joints. 3 mm calcific density just lateral to the superior aspect of the humeral head is similar to prior. No acute fracture or dislocation. IMPRESSION: 1. No acute fracture or dislocation. 2. A 3 mm calcific density just lateral to the superior aspect of the humeral head is similar to prior and may represent a tiny loose body or calcific tendinitis. Electronically Signed   By: Neita Garnet M.D.   On: 10/18/2023 18:05    Procedures Procedures    Medications Ordered in ED Medications  ketamine (KETALAR) injection 0.3 mg/kg (has no administration in time range)  LORazepam (ATIVAN) injection 1 mg (has no administration in time range)    ED Course/ Medical Decision Making/ A&P                                 Medical Decision Making Amount and/or Complexity of Data Reviewed Radiology: ordered.   Dominic Hinote 47 y.o. presented today for left shoulder pain. Working DDx that I considered at this time includes, but not limited to, contusion, strain/sprain, fracture, dislocation, neurovascular compromise, septic joint, ischemic limb, compartment syndrome.  R/o DDx: fracture, dislocation, neurovascular compromise, septic joint, ischemic limb, compartment syndrome, contusion: These are considered less likely due to history of present illness, physical exam, labs/imaging findings.  Review of prior external notes: 10/07/2023 ED  Unique Tests and My  Interpretation:  Left shoulder x-ray: Calcification of the tendon however no acute fractures  Social Determinants of Health: none  Discussion with Independent Historian:  Husband  Discussion of Management of Tests: None  Risk: Low: based on diagnostic testing/clinical impression and treatment plan  Risk Stratification Score: none  Staffed with Rubin Payor, MD  Plan: On exam patient was no acute stable vitals.  Patient's x-ray from triage does show calcified tendinitis similar to her right shoulder before the arthroscopy which is suspect is causing patient's pain.  Patient on exam was unwilling to range shoulder or precipitated physical exam due to pain.  Patient does have oxycodone at home.  I reviewed the PDMP database and patient had 16 prescriptions from 25 providers over the past 6 months and has been seen previously for this pain on 10/07/2023 and given a prescription of oxycodone here.  Patient has had multiple imaging in the past that has been unremarkable.  I discussed with the attending as patient is neuro vas intact and ultimately does not  show any concerning features on exam and we agree that patient does not need any IV narcotics at this time and agreed to offer ketamine and Ativan as this has helped in the past.  Patient stated that she would be accepting of this and that she would follow-up with orthopedics afterwards.  Will give pain meds and have her follow-up with orthopedics to discuss further management.  I offered a sling however patient declined.  Patient was given return precautions. Patient stable for discharge at this time.  Patient verbalized understanding of plan.  This chart was dictated using voice recognition software.  Despite best efforts to proofread,  errors can occur which can change the documentation meaning.         Final Clinical Impression(s) / ED Diagnoses Final diagnoses:  Acute pain of left shoulder    Rx / DC Orders ED Discharge Orders      None         Remi Deter 10/18/23 1940    Benjiman Core, MD 10/18/23 2317

## 2023-10-18 NOTE — Discharge Instructions (Signed)
Please follow-up with the orthopedist tomorrow morning regards resumes an ER visit.  Today your x-ray shows a of a calcified tendon however no fractures.  You may have strained your rotator cuff and will need to discuss with orthopedics for further management.  Please take your medications as prescribed and symptoms change or worsen please return to the ER.

## 2023-10-18 NOTE — ED Notes (Signed)
Pt. Stating "I don't feel well." Pt. With emesis.

## 2023-10-18 NOTE — ED Triage Notes (Signed)
Shoulder pain x a few weeks. "Something popped" when putting jacket on, the worse pain when reaching for a cabinet "Spasm" type pain reported

## 2023-10-18 NOTE — ED Notes (Signed)
Pt. With Kerlix wrapped as a sling to immobilize left shoulder. Pt. States was putting jacket on and heard pop and now is unable to move left arm and shoulder.

## 2023-10-18 NOTE — Telephone Encounter (Signed)
Patient called and said that her left arm popped and now she can't move her left arm. Can she come in to get an cortizone shot or something. She is in a lot of pain. CB#[phone]

## 2023-10-19 ENCOUNTER — Ambulatory Visit (INDEPENDENT_AMBULATORY_CARE_PROVIDER_SITE_OTHER): Payer: Medicare Other | Admitting: Surgical

## 2023-10-19 DIAGNOSIS — M25512 Pain in left shoulder: Secondary | ICD-10-CM

## 2023-10-19 MED ORDER — OXYCODONE-ACETAMINOPHEN 10-325 MG PO TABS: 0.5000 | ORAL_TABLET | Freq: Four times a day (QID) | ORAL | 0 refills | Status: DC | PRN

## 2023-10-19 MED ORDER — DIAZEPAM 5 MG PO TABS: 5.0000 mg | ORAL_TABLET | Freq: Once | ORAL | 0 refills | Status: AC

## 2023-10-19 NOTE — Telephone Encounter (Signed)
We can talk about it at her appointment today but likely [facility] joint injection based on last visit.  We'll check the left shoulder out

## 2023-10-19 NOTE — Progress Notes (Signed)
Post-Op Visit Note   Patient: Gina [name]           Date of Birth: [birth year]           MRN: [id number] Visit Date: 10/19/2023 PCP: Charolett Bumpers, PA-C   Assessment & Plan:  Chief Complaint:  Chief Complaint  Patient presents with   Right Shoulder - Follow-up    Right shoulder scope 09/27/2023   Left Shoulder - Pain   Visit Diagnoses:  1. Left shoulder pain, unspecified chronicity     Plan: Patient is a 47 year old female who returns for reevaluation following right shoulder arthroscopy with distal clavicle excision and decompression of calcific tendinitis on 09/27/2023.  She describes improvement in her right shoulder pain over the last several weeks to the point where she feels like her right shoulder is actually her good shoulder relative to the left shoulder that has begun causing her severe pain that she states is "worse than the right shoulder ever was".  She states that she had an acute episode of exacerbation of the left shoulder pain when she placed her left hand behind her back and felt a pop in the front of her left shoulder.  Since then she really has not been able to lift her arm and overall is limited to the basically no function of the left arm.  She has increased difficulty sleeping at night.  Cannot lay on either shoulder yet.  Left shoulder pain prompted her to seek care at emergency department with radiographs demonstrating small ossicle near the greater tuberosity which may represent calcific tendinitis.  On exam, patient has palpable radial pulses bilateral upper extremities.  She has no real passive or active motion of the left shoulder without severe pain.  Right shoulder has increased range of motion compared to previous exam with about 40 degrees X rotation, 80 degrees abduction, 90 degrees forward elevation passively and actively.  Axillary nerve is intact with deltoid firing bilaterally.  Unable to assess rotator cuff strength of the left shoulder due  to patient's severity of pain.  There is no deformity noted to either shoulder.  No ecchymosis noted to the left shoulder though there is a little bit of ecchymosis noted in the bicep and proximal forearm of the right shoulder.  Intact EPL, FPL, finger abduction bilaterally.  Plan is to start physical therapy for the right shoulder.  Physical therapy prescription provided to the patient and she will set up physical therapy with someone she has worked with before.  Need to obtain MRI arthrogram of the left shoulder to further evaluate for severe unrelenting pain that is now worse than the right shoulder ever was.  Previously before this popping incident, she was having some pain primarily localizing to the [facility] joint and while the [facility] joint is likely playing some role in her left shoulder pain, need to rule out rotator cuff or proximal bicep tendon tear.  Valium prescription provided to help with claustrophobia during MRI scan.  Will begin weaning her off of the oxycodone prescription.  Follow-Up Instructions: No follow-ups on file.   Orders:  Orders Placed This Encounter  Procedures   MR Shoulder Left w/ contrast   Arthrogram   No orders of the defined types were placed in this encounter.   Imaging: No results found.  PMFS History: Patient Active Problem List   Diagnosis Date Noted   Calcific supraspinatus tendonitis 10/03/2023   Bursitis of right shoulder 10/03/2023   Arthritis of right acromioclavicular joint  10/03/2023   S/P arthroscopy of right shoulder 09/27/2023   Persistent headaches 01/18/2023   Blurry vision 01/18/2023   Weight gain 01/18/2023   SBO (small bowel obstruction) (HCC) 09/13/2022   Nausea and vomiting 09/11/2022   Partial small bowel obstruction (HCC) 09/10/2022   Piriformis syndrome of both sides 05/31/2019   History of bipolar disorder 04/18/2019   Tardive dyskinesia 04/18/2019   Urticaria due to drug allergy 04/18/2019   Lumbar spondylosis 02/05/2019   Bipolar  affective disorder, current episode mixed (HCC) 01/02/2019   Arthritis 01/02/2019   Cigarette nicotine dependence without complication 01/02/2019   Bilateral temporomandibular joint pain 10/02/2018   Referred otalgia, bilateral 10/02/2018   Paresthesias 03/09/2018   Chronic migraine without aura, intractable, with status migrainosus 04/22/2016   Bipolar 1 disorder, mixed, moderate (HCC) 07/15/2015    Class: Chronic   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   Sciatica 01/09/2015   Reactive hypoglycemia 12/17/2013   Musculoskeletal malfunction arising from mental factors 04/04/2013   Incisional hernia 11/15/2012   Other postprocedural status(V45.89) 11/15/2012   S/P hernia surgery 11/15/2012   Abdominal pain 11/11/2012   Chronic pain syndrome 11/11/2012   Disorder of sacrum 11/11/2012   Chronic pain 12/21/2011   Heartburn 10/04/2011   DYSPHAGIA 10/04/2011   Depression with anxiety 09/15/2010   CONSTIPATION 09/15/2010   LUNG NODULE 06/17/2010   Herpes simplex virus (HSV) infection 06/11/2010   FATIGUE 06/11/2010   STRICTURE AND STENOSIS OF ESOPHAGUS 05/13/2010   ABDOMINAL WALL HERNIA 02/27/2010   ENDOMETRIOSIS 12/16/2009   GERD 10/23/2009   PALPITATIONS 10/23/2009   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   DYSTHYMIC DISORDER 06/20/2009   CONDYLOMA ACUMINATUM 04/23/2009   LENTIGO 04/23/2009   DENTAL PAIN 03/19/2009   CERVICAL RADICULOPATHY 12/11/2008   Cervical radiculopathy 12/11/2008   PANIC DISORDER WITHOUT AGORAPHOBIA 08/07/2008   TOBACCO ABUSE 08/07/2008   BACK PAIN, THORACIC REGION 07/07/2007   ALLERGIC RHINITIS 06/02/2007   Past Medical History:  Diagnosis Date   ABDOMINAL WALL HERNIA 02/27/2010   Abnormal Pap smear    ALLERGIC RHINITIS 06/02/2007   ANOREXIA, CHRONIC 08/07/2008   ANXIETY 09/15/2010   Arthritis    BACK PAIN, THORACIC REGION 07/07/2007   Bipolar disorder (HCC)    CERVICAL RADICULOPATHY 12/11/2008   Condyloma acuminatum  04/23/2009   CONSTIPATION 09/15/2010   DYSPHAGIA UNSPECIFIED 09/09/2009   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   Eating disorder    ENDOMETRIOSIS 12/16/2009   FATIGUE 06/11/2010   Fibromyalgia    GERD 10/23/2009   Heart palpitations    HERPES SIMPLEX INFECTION 06/11/2010   LENTIGO 04/23/2009   LUNG NODULE 06/17/2010   Narcotic abuse, continuous (HCC)    Ovarian cyst    Palpitations 10/23/2009   Panic disorder    Renal disorder    pt denies   Rheumatoid arthritis(714.0)    Stricture and stenosis of esophagus 05/13/2010   TOBACCO ABUSE 08/07/2008   Urinary tract infection    UTI (urinary tract infection)     Family History  Problem Relation Age of Onset   Depression Mother    Arthritis Mother    Hyperlipidemia Father    Hypertension Father     Past Surgical History:  Procedure Laterality Date   ABDOMINAL SURGERY     ABDOMINAL WALL MESH  REMOVAL     ABLATION ON ENDOMETRIOSIS     CESAREAN SECTION     x2    DILATION AND CURETTAGE OF UTERUS     x1    ENDOMETRIAL ABLATION  esophageal dilatation x 4     GUM SURGERY     HERNIA REPAIR     X3   PARTIAL HYSTERECTOMY     SHOULDER SURGERY Right    Social History   Occupational History   Not on file  Tobacco Use   Smoking status: Every Day    Current packs/day: 0.20    Types: Cigarettes   Smokeless tobacco: Current  Vaping Use   Vaping status: Every Day  Substance and Sexual Activity   Alcohol use: No   Drug use: No   Sexual activity: Yes    Birth control/protection: Surgical

## 2023-10-23 ENCOUNTER — Encounter: Payer: Self-pay | Admitting: Surgical

## 2023-10-28 ENCOUNTER — Other Ambulatory Visit: Payer: Self-pay | Admitting: Orthopedic Surgery

## 2023-10-28 ENCOUNTER — Telehealth: Payer: Self-pay | Admitting: Surgical

## 2023-10-28 ENCOUNTER — Telehealth: Payer: Self-pay | Admitting: Orthopedic Surgery

## 2023-10-28 MED ORDER — OXYCODONE-ACETAMINOPHEN 10-325 MG PO TABS: ORAL_TABLET | ORAL | 0 refills | Status: DC

## 2023-10-28 NOTE — Telephone Encounter (Signed)
Pt called right shoulder painful and tender and asking for refill of oxycodone. Please send to [facility] on [address]. Pt phone number is [phone].

## 2023-10-28 NOTE — Telephone Encounter (Signed)
[facility] called in stating they tried to get pt scheduled for a STAT MRI w/ contrast Arthrogram and the earliest they can get her in was on 11/03/23 and they pt is demanding she be seen before the end of the year and being its only 2 days left until then it will be very hard to get that scheduled. Pt would like it done before the end of the year from a financial standpoint. They would like if we could call the pt and explain it to her she is not wanting to listen to them or talk to her about possible other options if any. Please advise

## 2023-10-31 ENCOUNTER — Ambulatory Visit (INDEPENDENT_AMBULATORY_CARE_PROVIDER_SITE_OTHER): Payer: Medicare Other | Admitting: Surgical

## 2023-10-31 ENCOUNTER — Encounter: Payer: Self-pay | Admitting: Surgical

## 2023-10-31 DIAGNOSIS — M7531 Calcific tendinitis of right shoulder: Secondary | ICD-10-CM

## 2023-10-31 MED ORDER — OXYCODONE-ACETAMINOPHEN 10-325 MG PO TABS: 0.5000 | ORAL_TABLET | Freq: Three times a day (TID) | ORAL | 0 refills | Status: DC | PRN

## 2023-10-31 NOTE — Progress Notes (Cosign Needed)
Post-Op Visit Note   Patient: Gina [name]           Date of Birth: 08-15-1976           MRN: [id number] Visit Date: 10/31/2023 PCP: Charolett Bumpers, PA-C   Assessment & Plan:  Chief Complaint:  Chief Complaint  Patient presents with   Right Shoulder - Routine Post Op, Follow-up   Visit Diagnoses: No diagnosis found.  Plan: Patient is a 47 year old female who presents s/p right shoulder arthroscopy with subacromial decompression, debridement of calcific tendinitis, distal clavicle excision on 09/27/2023.  She reports that she unfortunately fell asleep and rolled onto her right shoulder about a week ago.  She stayed that way throughout the night and woke up in the morning with severe increase in right shoulder pain.  She localizes pain to the lateral aspects of the shoulder with no radiation down the arm past about the mid tricep or mid humeral region.  There is no scapular pain, numbness/tingling, neck pain that is new.  No radicular pain down the arm.  Taking oxycodone about every 6 hours.  On exam, right shoulder has similar range of motion compared with last visit.  Incisions look to be healing well with no evidence of infection or dehiscence.  Axillary nerve is intact with deltoid firing.  Intact EPL, FPL, finger abduction, grip strength testing.  Palpable radial pulse rated 2+ of the right upper extremity.  Impression is flareup of postoperative pain from sleeping on the right side.  Recommended she try ice instead of heat and proceed with starting physical therapy for her right shoulder.  Do not see anything on exam today that seems to require any additional imaging.  Plan is to continue with ordering MRI of the left shoulder and follow-up after MRI of the left shoulder so we can discuss options for that shoulder and reevaluate how her right shoulder is doing.  We will refill her pain medication again.  We did discuss the need to taper off this medication continually.  She  understands.  As an aside, she does note some continued hoarseness of her voice ever since surgical procedure.  Discussed that sometimes patients can have some lingering hoarseness after intubation.  We will give this a little bit more time to resolve and we can further evaluate when she comes back for reviewing MRI.  Follow-Up Instructions: No follow-ups on file.   Orders:  No orders of the defined types were placed in this encounter.  No orders of the defined types were placed in this encounter.   Imaging: No results found.  PMFS History: Patient Active Problem List   Diagnosis Date Noted   Calcific supraspinatus tendonitis 10/03/2023   Bursitis of right shoulder 10/03/2023   Arthritis of right acromioclavicular joint 10/03/2023   S/P arthroscopy of right shoulder 09/27/2023   Persistent headaches 01/18/2023   Blurry vision 01/18/2023   Weight gain 01/18/2023   SBO (small bowel obstruction) (HCC) 09/13/2022   Nausea and vomiting 09/11/2022   Partial small bowel obstruction (HCC) 09/10/2022   Piriformis syndrome of both sides 05/31/2019   History of bipolar disorder 04/18/2019   Tardive dyskinesia 04/18/2019   Urticaria due to drug allergy 04/18/2019   Lumbar spondylosis 02/05/2019   Bipolar affective disorder, current episode mixed (HCC) 01/02/2019   Arthritis 01/02/2019   Cigarette nicotine dependence without complication 01/02/2019   Bilateral temporomandibular joint pain 10/02/2018   Referred otalgia, bilateral 10/02/2018   Paresthesias 03/09/2018   Chronic migraine  without aura, intractable, with status migrainosus 04/22/2016   Bipolar 1 disorder, mixed, moderate (HCC) 07/15/2015    Class: Chronic   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   Sciatica 01/09/2015   Reactive hypoglycemia 12/17/2013   Musculoskeletal malfunction arising from mental factors 04/04/2013   Incisional hernia 11/15/2012   Other postprocedural status(V45.89) 11/15/2012   S/P  hernia surgery 11/15/2012   Abdominal pain 11/11/2012   Chronic pain syndrome 11/11/2012   Disorder of sacrum 11/11/2012   Chronic pain 12/21/2011   Heartburn 10/04/2011   DYSPHAGIA 10/04/2011   Depression with anxiety 09/15/2010   CONSTIPATION 09/15/2010   LUNG NODULE 06/17/2010   Herpes simplex virus (HSV) infection 06/11/2010   FATIGUE 06/11/2010   STRICTURE AND STENOSIS OF ESOPHAGUS 05/13/2010   ABDOMINAL WALL HERNIA 02/27/2010   ENDOMETRIOSIS 12/16/2009   GERD 10/23/2009   PALPITATIONS 10/23/2009   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   DYSTHYMIC DISORDER 06/20/2009   CONDYLOMA ACUMINATUM 04/23/2009   LENTIGO 04/23/2009   DENTAL PAIN 03/19/2009   CERVICAL RADICULOPATHY 12/11/2008   Cervical radiculopathy 12/11/2008   PANIC DISORDER WITHOUT AGORAPHOBIA 08/07/2008   TOBACCO ABUSE 08/07/2008   BACK PAIN, THORACIC REGION 07/07/2007   ALLERGIC RHINITIS 06/02/2007   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   Eating disorder    ENDOMETRIOSIS 12/16/2009   FATIGUE 06/11/2010   Fibromyalgia    GERD 10/23/2009   Heart palpitations    HERPES SIMPLEX INFECTION 06/11/2010   LENTIGO 04/23/2009   LUNG NODULE 06/17/2010   Narcotic abuse, continuous (HCC)    Ovarian cyst    Palpitations 10/23/2009   Panic disorder    Renal disorder    pt denies   Rheumatoid arthritis(714.0)    Stricture and stenosis of esophagus 05/13/2010   TOBACCO ABUSE 08/07/2008   Urinary tract infection    UTI (urinary tract infection)     Family History  Problem Relation Age of Onset   Depression Mother     Arthritis Mother    Hyperlipidemia Father    Hypertension Father     Past Surgical History:  Procedure Laterality Date   ABDOMINAL SURGERY     ABDOMINAL WALL MESH  REMOVAL     ABLATION ON ENDOMETRIOSIS     CESAREAN SECTION     x2    DILATION AND CURETTAGE OF UTERUS     x1    ENDOMETRIAL ABLATION     esophageal dilatation x 4     GUM SURGERY     HERNIA REPAIR     X3   PARTIAL HYSTERECTOMY     SHOULDER SURGERY Right    Social History   Occupational History   Not on file  Tobacco Use   Smoking status: Every Day    Current packs/day: 0.20    Types: Cigarettes   Smokeless tobacco: Current  Vaping Use   Vaping status: Every Day  Substance and Sexual Activity   Alcohol use: No   Drug use: No   Sexual activity: Yes    Birth control/protection: Surgical

## 2023-10-31 NOTE — Telephone Encounter (Signed)
I've contacted pt and we are trying to get pt in elsewhere to have MRI done-  I contacted Myriam Jacobson with [facility] and she is going to see if can get pt tomorrow.

## 2023-11-01 ENCOUNTER — Other Ambulatory Visit: Payer: Medicare Other

## 2023-11-01 ENCOUNTER — Ambulatory Visit (INDEPENDENT_AMBULATORY_CARE_PROVIDER_SITE_OTHER): Payer: Medicare Other | Admitting: Sports Medicine

## 2023-11-01 ENCOUNTER — Inpatient Hospital Stay: Admission: RE | Admit: 2023-11-01 | Payer: Medicare Other | Source: Ambulatory Visit

## 2023-11-01 ENCOUNTER — Ambulatory Visit (INDEPENDENT_AMBULATORY_CARE_PROVIDER_SITE_OTHER): Payer: Medicare Other

## 2023-11-01 DIAGNOSIS — G8929 Other chronic pain: Secondary | ICD-10-CM

## 2023-11-01 DIAGNOSIS — M25512 Pain in left shoulder: Secondary | ICD-10-CM

## 2023-11-01 DIAGNOSIS — M7552 Bursitis of left shoulder: Secondary | ICD-10-CM | POA: Diagnosis not present

## 2023-11-01 NOTE — Assessment & Plan Note (Signed)
Injection performed today for MR arthrography, further management per primary treating provider. 

## 2023-11-01 NOTE — Progress Notes (Signed)
    Procedures performed today:    Procedure: Real-time Ultrasound Guided gadolinium contrast injection of left glenohumeral joint Device: Samsung HS60  Verbal informed consent obtained.  Time-out conducted.  Noted no overlying erythema, induration, or other signs of local infection.  Skin prepped in a sterile fashion.  Local anesthesia: Topical Ethyl chloride.  With sterile technique and under real time ultrasound guidance: 22 spinal needle advanced to the glenohumeral joint from a posterior approach, I injected 1 cc kenalog 40, 2 cc lidocaine , 2 cc bupivacaine , syringe switched and 0.1 cc gadolinium injected, syringe switched and 10 cc sterile saline used to fully distend the joint. Joint visualized and capsule seen distending confirming intra-articular placement of contrast material and medication. Completed without difficulty  Advised to call if fevers/chills, erythema, induration, drainage, or persistent bleeding.  Images permanently stored in PACS Impression: Technically successful ultrasound guided gadolinium contrast injection for MR arthrography.  Please see separate MR arthrogram report.  Independent interpretation of notes and tests performed by another provider:   None.  Brief History, Exam, Impression, and Recommendations:    Chronic pain in left shoulder Injection performed today for MR arthrography, further management per primary treating provider.    ____________________________________________ Debby PARAS. Curtis, M.D., ABFM., CAQSM., AME. Primary Care and Sports Medicine [facility]  Adjunct Professor of [facility] Medicine  [employer]  Restaurant Manager, [employer]

## 2023-11-07 ENCOUNTER — Encounter: Payer: Self-pay | Admitting: Surgical

## 2023-11-07 ENCOUNTER — Ambulatory Visit: Payer: Medicare Other | Admitting: Surgical

## 2023-11-07 DIAGNOSIS — M19012 Primary osteoarthritis, left shoulder: Secondary | ICD-10-CM

## 2023-11-07 NOTE — Progress Notes (Signed)
 Office Visit Note   Patient: Gina [name]           Date of Birth: [birth year]           MRN: [id number] Visit Date: 11/07/2023 Requested by: Emerick Avelina POUR, PA-C [id number] [place],  [place] [address] PCP: Emerick Avelina POUR DEVONNA  Subjective: Chief Complaint  Patient presents with   Left Shoulder - Follow-up    MRI review    HPI: Gina [name] is a 48 y.o. female who presents to the office for MRI review. Patient denies any changes in symptoms.  Continues to complain mainly of left shoulder pain primarily.  She is recovering from right shoulder surgery as well.  She states that she had her first session of physical therapy for her right shoulder today and this is progressing well for her.  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  Only taking 1.5 to 2 pills of pain medication per day and is working to wean off this even more.  Left shoulder still causing her more pain than the right shoulder.  Has occasional popping sensation when she reaches behind her with the left arm that will cause fairly excruciating pain.  Cannot lay on the left arm or the right arm at night.  MRI results revealed: MR Shoulder Left w/ contrast Result Date: 11/01/2023 CLINICAL DATA:  Left shoulder pain with limited range of motion for 1 month. Evaluate for rotator cuff tear biceps tendon tear. EXAM: MRI OF THE LEFT SHOULDER WITH CONTRAST TECHNIQUE: Multiplanar, multisequence MR imaging of the left shoulder was performed following the administration of intra-articular contrast. CONTRAST:  See Injection Documentation. COMPARISON:  Left shoulder radiographs 10/18/2023, 10/07/2023 FINDINGS: Rotator cuff: Mild anterior supraspinatus intermediate T2 signal tendinosis within the tendon footprint. Minimal anterior infraspinatus intermediate T2 signal insertional tendinosis. Muscles: No rotator cuff muscle atrophy, fatty  infiltration, or edema. Biceps long head: The intra-articular long head of the biceps tendon is intact. Acromioclavicular Joint: There are mild degenerative changes of the acromioclavicular joint including joint space narrowing, predominantly clavicular head subchondral marrow edema, and mild peripheral osteophytosis. Type II acromion. Minimal fluid within the subacromial/subdeltoid bursa. Glenohumeral Joint: Mild surface irregularity of the glenohumeral cartilage. Labrum: There is adequate distention of the glenohumeral joint following arthrogram injection. No glenoid labral tear is seen. Bones:  No acute fracture. Other: None. IMPRESSION: 1. Mild anterior supraspinatus and minimal anterior infraspinatus tendinosis. No rotator cuff tear. 2. Mild degenerative changes of the acromioclavicular joint. 3. Minimal subacromial/subdeltoid bursitis. Electronically Signed   By: Tanda Lyons M.D.   On: 11/01/2023 16:20                 ROS: All systems reviewed are negative as they relate to the chief complaint within the history of present illness.  Patient denies fevers or chills.  Assessment & Plan: Visit Diagnoses:  1. Arthritis of left acromioclavicular joint     Plan: Gina [name] is a 48 y.o. female who presents to the office for review of left shoulder MRI.  MRI demonstrates some minimal bursitis and tendinosis without any rotator cuff damage.  Most significant pathology on MRI seems to be [facility] joint arthritis which is consistent with her previous history and exams.  As far as options, we discussed waiting out this pain to see if it will improve versus AC joint injection  versus therapy exercises.  She would like to hold off on injection for now and she will see how her shoulders progress with PT over the next month.  Follow-up in 4 weeks for clinical recheck with Dr. Addie but call if any concerns in the meantime and she is welcome to call the office if she would like to try [facility] joint injection in the  meantime.  Follow-Up Instructions: No follow-ups on file.   Orders:  No orders of the defined types were placed in this encounter.  No orders of the defined types were placed in this encounter.     Procedures: No procedures performed   Clinical Data: No additional findings.  Objective: Vital Signs: LMP 01/01/2004   Physical Exam:  Constitutional: Patient appears well-developed HEENT:  Head: Normocephalic Eyes:EOM are normal Neck: Normal range of motion Cardiovascular: Normal rate Pulmonary/chest: Effort normal Neurologic: Patient is alert Skin: Skin is warm Psychiatric: Patient has normal mood and affect  Ortho Exam: Ortho exam demonstrates right arm with well-healed incisions.  Intact active abduction to approximately 80 degrees.  Patient in no acute distress.  No change compared with prior exam.  Specialty Comments:  Narrative & Impression CLINICAL DATA:  Low back pain, prior surgery, new symptoms. Ongoing low back pain for 2 months. No known injury. No prior surgery to area.   EXAM: MRI LUMBAR SPINE WITHOUT AND WITH CONTRAST   TECHNIQUE: Multiplanar and multiecho pulse sequences of the lumbar spine were obtained without and with intravenous contrast.   CONTRAST:  10mL GADAVIST  GADOBUTROL  1 MMOL/ML IV SOLN   COMPARISON:  Lumbar MRI 04/02/2018. CT of the abdomen and pelvis 06/15/2023. Lumbar spine radiographs 09/18/2018.   FINDINGS: Segmentation: Conventional anatomy assumed, with the last open disc space designated L5-S1.Concordant with prior imaging.   Alignment: Mild convex right scoliosis. No focal angulation or listhesis.   Vertebrae: No worrisome osseous lesion, acute fracture or pars defect. Chronic Schmorl's node in the inferior endplate of L1. No evidence of discitis or osteomyelitis. No obvious postsurgical changes in the lumbar spine.   Conus medullaris: Extends to the L1-2 level and appears normal. No abnormal intradural enhancement.    Paraspinal and other soft tissues: No significant paraspinal findings.   Disc levels:   Sagittal images demonstrate no significant disc space findings within the visualized lower thoracic spine.   L1-2: Stable loss of disc height with disc bulging and endplate osteophytes. No spinal stenosis or nerve root encroachment.   L2-3: Normal interspace.   L3-4: Normal interspace.   L4-5: Normal interspace.   L5-S1: Normal interspace.   IMPRESSION: 1. No acute findings or explanation for the patient's symptoms. 2. Stable spondylosis at L1-2 without resulting spinal stenosis or nerve root encroachment.     Electronically Signed   By: Elsie Perone M.D.   On: 07/29/2023 18:36  Imaging: No results found.   PMFS History: Patient Active Problem List   Diagnosis Date Noted   Chronic pain in left shoulder 11/01/2023   Calcific supraspinatus tendonitis 10/03/2023   Bursitis of right shoulder 10/03/2023   Arthritis of right acromioclavicular joint 10/03/2023   S/P arthroscopy of right shoulder 09/27/2023   Persistent headaches 01/18/2023   Blurry vision 01/18/2023   Weight gain 01/18/2023   SBO (small bowel obstruction) (HCC) 09/13/2022   Nausea and vomiting 09/11/2022   Partial small bowel obstruction (HCC) 09/10/2022   Piriformis syndrome of both sides 05/31/2019   History of bipolar disorder 04/18/2019   Tardive dyskinesia 04/18/2019   Urticaria  due to drug allergy 04/18/2019   Lumbar spondylosis 02/05/2019   Bipolar affective disorder, current episode mixed (HCC) 01/02/2019   Arthritis 01/02/2019   Cigarette nicotine  dependence without complication 01/02/2019   Bilateral temporomandibular joint pain 10/02/2018   Referred otalgia, bilateral 10/02/2018   Paresthesias 03/09/2018   Chronic migraine without aura, intractable, with status migrainosus 04/22/2016   Bipolar 1 disorder, mixed, moderate (HCC) 07/15/2015    Class: Chronic   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 06/01/2015   Sciatica 01/09/2015   Reactive hypoglycemia 12/17/2013   Musculoskeletal malfunction arising from mental factors 04/04/2013   Incisional hernia 11/15/2012   Other postprocedural status(V45.89) 11/15/2012   S/P hernia surgery 11/15/2012   Abdominal pain 11/11/2012   Chronic pain syndrome 11/11/2012   Disorder of sacrum 11/11/2012   Chronic pain 12/21/2011   Heartburn 10/04/2011   DYSPHAGIA 10/04/2011   Depression with anxiety 09/15/2010   CONSTIPATION 09/15/2010   LUNG NODULE 06/17/2010   Herpes simplex virus (HSV) infection 06/11/2010   FATIGUE 06/11/2010   STRICTURE AND STENOSIS OF ESOPHAGUS 05/13/2010   ABDOMINAL WALL HERNIA 02/27/2010   ENDOMETRIOSIS 12/16/2009   GERD 10/23/2009   PALPITATIONS 10/23/2009   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   DYSTHYMIC DISORDER 06/20/2009   CONDYLOMA ACUMINATUM 04/23/2009   LENTIGO 04/23/2009   DENTAL PAIN 03/19/2009   CERVICAL RADICULOPATHY 12/11/2008   Cervical radiculopathy 12/11/2008   PANIC DISORDER WITHOUT AGORAPHOBIA 08/07/2008   TOBACCO ABUSE 08/07/2008   BACK PAIN, THORACIC REGION 07/07/2007   ALLERGIC RHINITIS 06/02/2007   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   Eating disorder    ENDOMETRIOSIS 12/16/2009   FATIGUE 06/11/2010   Fibromyalgia    GERD 10/23/2009   Heart palpitations    HERPES SIMPLEX INFECTION 06/11/2010   LENTIGO 04/23/2009   LUNG NODULE 06/17/2010   Narcotic abuse, continuous (HCC)    Ovarian cyst    Palpitations 10/23/2009   Panic disorder    Renal disorder    pt denies   Rheumatoid  arthritis(714.0)    Stricture and stenosis of esophagus 05/13/2010   TOBACCO ABUSE 08/07/2008   Urinary tract infection    UTI (urinary tract infection)     Family History  Problem Relation Age of Onset   Depression Mother    Arthritis Mother    Hyperlipidemia Father    Hypertension Father     Past Surgical History:  Procedure Laterality Date   ABDOMINAL SURGERY     ABDOMINAL WALL MESH  REMOVAL     ABLATION ON ENDOMETRIOSIS     CESAREAN SECTION     x2    DILATION AND CURETTAGE OF UTERUS     x1    ENDOMETRIAL ABLATION     esophageal dilatation x 4     GUM SURGERY     HERNIA REPAIR     X3   PARTIAL HYSTERECTOMY     SHOULDER SURGERY Right    Social History   Occupational History   Not on file  Tobacco Use   Smoking status: Every Day    Current packs/day: 0.20    Types: Cigarettes  Smokeless tobacco: Current  Vaping Use   Vaping status: Every Day  Substance and Sexual Activity   Alcohol use: No   Drug use: No   Sexual activity: Yes    Birth control/protection: Surgical

## 2023-11-10 ENCOUNTER — Emergency Department (HOSPITAL_BASED_OUTPATIENT_CLINIC_OR_DEPARTMENT_OTHER): Payer: Medicare Other | Admitting: Radiology

## 2023-11-10 ENCOUNTER — Other Ambulatory Visit: Payer: Self-pay

## 2023-11-10 ENCOUNTER — Emergency Department (HOSPITAL_BASED_OUTPATIENT_CLINIC_OR_DEPARTMENT_OTHER)
Admission: EM | Admit: 2023-11-10 | Discharge: 2023-11-10 | Disposition: A | Discharge status: Home / Self Care | Payer: Medicare Other | Attending: Emergency Medicine | Admitting: Emergency Medicine

## 2023-11-10 DIAGNOSIS — M25512 Pain in left shoulder: Secondary | ICD-10-CM | POA: Insufficient documentation

## 2023-11-10 DIAGNOSIS — G8929 Other chronic pain: Secondary | ICD-10-CM | POA: Diagnosis not present

## 2023-11-10 MED ORDER — ACETAMINOPHEN 500 MG PO TABS
1000.0000 mg | ORAL_TABLET | Freq: Once | ORAL | Status: DC
  Filled 2023-11-10: qty 2

## 2023-11-10 NOTE — Discharge Instructions (Signed)
 Your shoulder x-ray did not show any fracture or dislocation.  You can continue taking your home medications.  Please follow-up with your orthopedic surgeon.

## 2023-11-10 NOTE — ED Notes (Signed)
Patient transported to X-Ray 

## 2023-11-10 NOTE — ED Triage Notes (Signed)
 Pt POV from home reporting persistent L shoulder pain. MRI 3 days ago, AC joint arthritis and tear of rotator cuff, surgery planned, states she felt a "pop" today when trying to push herself up in chair. Unable to move arm due to pain.

## 2023-11-10 NOTE — ED Notes (Signed)
 ED Provider at bedside.

## 2023-11-10 NOTE — ED Provider Notes (Signed)
 [place] EMERGENCY DEPARTMENT AT [facility] Provider Note   CSN: [id number] Arrival date & time: 11/10/23  2035     History  No chief complaint on file.   Gina [name] is a 48 y.o. female.  48 year old female here today for shoulder pain.  Patient says that she was pushing off to stand up and felt a pop in her shoulder.  Patient on MRI done of her shoulder a few days ago, followed up with orthopedics.  Results of the MRI reviewed, orthopedic office notes reviewed.        Home Medications Prior to Admission medications   Medication Sig Start Date End Date Taking? Authorizing Provider  calcium carbonate (TUMS - DOSED IN MG ELEMENTAL CALCIUM) 500 MG chewable tablet Chew 1 tablet by mouth 3 (three) times daily as needed for indigestion or heartburn.    [provider]  cyclobenzaprine  (FLEXERIL ) 5 MG tablet Take 5 mg by mouth 3 (three) times daily.    [provider]  escitalopram  (LEXAPRO ) 20 MG tablet Take 30 mg by mouth at bedtime. 12/20/18   [provider]  LORazepam  (ATIVAN ) 1 MG tablet Take 1 mg by mouth every 8 (eight) hours as needed for anxiety.    [provider]  Lurasidone  HCl 60 MG TABS Take 60 mg by mouth daily. 12/16/22   [provider]  omeprazole (PRILOSEC) 40 MG capsule Take 40 mg by mouth daily.    [provider]  oxyCODONE -acetaminophen  (PERCOCET) 10-325 MG tablet Take 0.5-1 tablets by mouth every 8 (eight) hours as needed for pain. 10/31/23   Magnant, Carlin CROME, PA-C  promethazine  (PHENERGAN ) 25 MG tablet Take 1 tablet (25 mg total) by mouth every 6 (six) hours as needed for up to 15 doses for nausea or vomiting. 04/14/23   Cottie Donnice PARAS, MD  rizatriptan  (MAXALT -MLT) 10 MG disintegrating tablet Take 1 tablet (10 mg total) by mouth as needed. May repeat in 2 hours if needed 01/18/23   Onita Duos, MD      Allergies    Lamictal [lamotrigine], Morphine  and codeine , Nicotine , Rizatriptan ,  Zofran , Chantix  [varenicline  tartrate], Ciprofloxacin , Codeine , Haldol  [haloperidol ], Ibuprofen, Lidoderm  [lidocaine ], Metaxalone, Sulfa antibiotics, Sulfasalazine, Toradol  [ketorolac  tromethamine ], Doxycycline , Duloxetine, Duloxetine hcl, Gabapentin , Ingrezza [valbenazine tosylate], Methadone, Other, Prednisone , Pregabalin, Zolpidem, Zolpidem tartrate, Amoxicillin, Aripiprazole, Chlorhexidine , Penicillins, Tramadol , and Valbenazine    Review of Systems   Review of Systems  Physical Exam Updated Vital Signs BP 113/66 (BP Location: Right Arm)   Pulse (!) 108   Temp 100 F (37.8 C)   Resp 20   Ht 5' 7 (1.702 m)   Wt 99.3 kg   LMP 01/01/2004   SpO2 97%   BMI 34.30 kg/m  Physical Exam Vitals reviewed.  Cardiovascular:     Rate and Rhythm: Normal rate.  Pulmonary:     Effort: Pulmonary effort is normal.  Musculoskeletal:        General: No swelling, tenderness, deformity or signs of injury. Normal range of motion.  Skin:    Capillary Refill: Capillary refill takes less than 2 seconds.  Neurological:     General: No focal deficit present.     Mental Status: She is alert.     ED Results / Procedures / Treatments   Labs (all labs ordered are listed, but only abnormal results are displayed) Labs Reviewed - No data to display  EKG None  Radiology DG Shoulder Left Result Date: 11/10/2023 CLINICAL DATA:  Shoulder pain EXAM: LEFT SHOULDER -  2+ VIEW COMPARISON:  10/18/2023, MRI 11/01/2023 FINDINGS: No fracture or malalignment. Mild AC joint degenerative change. The left apex is clear IMPRESSION: Mild AC joint degenerative change. Electronically Signed   By: [name] Bun M.D.   On: 11/10/2023 22:49    Procedures Procedures    Medications Ordered in ED Medications  acetaminophen  (TYLENOL ) tablet 1,000 mg (1,000 mg Oral Patient Refused/Not Given 11/10/23 2226)    ED Course/ Medical Decision Making/ A&P                                 Medical Decision Making 48 year old  female here today for shoulder pain.  Differential diagnoses include shoulder dislocation, chronic shoulder pain.  Plan-on exam, shoulder is normal-appearing.  Low suspicion for dislocation.  Will obtain plain films.  Upper extremity warm, well-perfused.  Reviewed the patient's office note, and there are some inconsistencies with what the orthopedic PA dictated and what the patient described to me.  Patient stated that she was going to get surgery, however it appears that they are taken a more conservative approach.  Patient also stated that she had rotator cuff tears, however that is not what the MRI describes.  Patient is currently process of being weaned off of pain medication by her orthopedic physician.  Do not believe patient has an injury which requires narcotics in the emergency department at this time.  Have provided Tylenol  for the patient.  Reassessment 10:55 PM-patient's x-ray shows only mild AC degenerative changes, no dislocation or fracture.  Discussed findings with the patient, she is requesting stronger pain medications.  Explained to the patient how with these findings, would not be prescribing narcotics.  Patient did become a bit tearful at this, explained to the patient how her orthopedic surgeon is currently trying to wean her off of narcotics, narcotics would likely would cause more harm than good in the future.  Will discharge patient home.  Amount and/or Complexity of Data Reviewed Radiology: ordered.  Risk OTC drugs.           Final Clinical Impression(s) / ED Diagnoses Final diagnoses:  Chronic left shoulder pain    Rx / DC Orders ED Discharge Orders     None         Mannie Fairy DASEN, DO 11/10/23 2259

## 2023-11-16 ENCOUNTER — Other Ambulatory Visit: Payer: Self-pay | Admitting: Urology

## 2023-11-16 NOTE — Progress Notes (Addendum)
COVID Vaccine Completed: yes  Date of COVID positive in last 90 days: no  PCP - Cruz Condon, PA Cardiologist - n/a  Chest x-ray - 11/04/23 [facility] EKG - 11/04/23 [facility] Stress Test - n/a ECHO - n/a Cardiac Cath - n/a Pacemaker/ICD device last checked: n/a Spinal Cord Stimulator: n/a  Bowel Prep - no  Sleep Study - yes, negative CPAP -   Fasting Blood Sugar - n/a Checks Blood Sugar _____ times a day  Last dose of GLP1 agonist-  N/A GLP1 instructions:  Hold 7 days before surgery    Last dose of SGLT-2 inhibitors-  N/A SGLT-2 instructions:  Hold 3 days before surgery    Blood Thinner Instructions: n/a Aspirin Instructions: Last Dose:  Activity level: Can go up a flight of stairs and perform activities of daily living without stopping and without symptoms of chest pain or shortness of breath.  Anesthesia review:   Patient denies shortness of breath, fever, cough and chest pain at PAT appointment  Patient verbalized understanding of instructions that were given to them at the PAT appointment. Patient was also instructed that they will need to review over the PAT instructions again at home before surgery.

## 2023-11-17 ENCOUNTER — Other Ambulatory Visit: Payer: Self-pay | Admitting: Urology

## 2023-11-18 ENCOUNTER — Encounter (HOSPITAL_COMMUNITY)
Admission: RE | Admit: 2023-11-18 | Discharge: 2023-11-18 | Disposition: A | Discharge status: Home / Self Care | Payer: Medicare Other | Source: Ambulatory Visit | Attending: Urology | Admitting: Urology

## 2023-11-18 ENCOUNTER — Encounter (HOSPITAL_COMMUNITY): Payer: Self-pay

## 2023-11-18 ENCOUNTER — Other Ambulatory Visit: Payer: Self-pay

## 2023-11-18 VITALS — BP 94/52 | HR 93 | Temp 99.8°F | Resp 16 | Ht 67.0 in | Wt 222.0 lb

## 2023-11-18 DIAGNOSIS — D649 Anemia, unspecified: Secondary | ICD-10-CM | POA: Insufficient documentation

## 2023-11-18 DIAGNOSIS — Z01812 Encounter for preprocedural laboratory examination: Secondary | ICD-10-CM | POA: Insufficient documentation

## 2023-11-18 DIAGNOSIS — Z01818 Encounter for other preprocedural examination: Secondary | ICD-10-CM

## 2023-11-18 HISTORY — DX: Other complications of anesthesia, initial encounter: T88.59XA

## 2023-11-18 LAB — CBC
HCT: 45.4 % (ref 36.0–46.0)
Hemoglobin: 14.9 g/dL (ref 12.0–15.0)
MCH: 31 pg (ref 26.0–34.0)
MCHC: 32.8 g/dL (ref 30.0–36.0)
MCV: 94.6 fL (ref 80.0–100.0)
Platelets: 238 10*3/uL (ref 150–400)
RBC: 4.8 MIL/uL (ref 3.87–5.11)
RDW: 13.1 % (ref 11.5–15.5)
WBC: 6.6 10*3/uL (ref 4.0–10.5)
nRBC: 0 % (ref 0.0–0.2)

## 2023-11-18 NOTE — Patient Instructions (Signed)
SURGICAL WAITING ROOM VISITATION  Patients having surgery or a procedure may have no more than 2 support people in the waiting area - these visitors may rotate.    Children under the age of 58 must have an adult with them who is not the patient.  Due to an increase in RSV and influenza rates and associated hospitalizations, children ages 22 and under may not visit patients in [facility] hospitals.  Visitors with respiratory illnesses are discouraged from visiting and should remain at home.  If the patient needs to stay at the hospital during part of their recovery, the visitor guidelines for inpatient rooms apply. Pre-op nurse will coordinate an appropriate time for 1 support person to accompany patient in pre-op.  This support person may not rotate.    Please refer to the [facility] website for the visitor guidelines for Inpatients (after your surgery is over and you are in a regular room).    Your procedure is scheduled on: 11/22/23   Report to [facility] Main Entrance    Report to admitting at 7:15 AM   Call this number if you have problems the morning of surgery [phone]   Do not eat food or drink liquids :After Midnight.          If you have questions, please contact your surgeon's office.   FOLLOW BOWEL PREP AND ANY ADDITIONAL PRE OP INSTRUCTIONS YOU RECEIVED FROM YOUR SURGEON'S OFFICE!!!     Oral Hygiene is also important to reduce your risk of infection.                                    Remember - BRUSH YOUR TEETH THE MORNING OF SURGERY WITH YOUR REGULAR TOOTHPASTE  DENTURES WILL BE REMOVED PRIOR TO SURGERY PLEASE DO NOT APPLY "Poly grip" OR ADHESIVES!!!   Do NOT smoke after Midnight   Stop all vitamins and herbal supplements 7 days before surgery.   Take these medicines the morning of surgery with A SIP OF WATER: Lexapro, Ativan, Omeprazole, Lurasidane, Albuterol                              You may not have any metal on your body including hair  pins, jewelry, and body piercing             Do not wear make-up, lotions, powders, perfumes, or deodorant  Do not wear nail polish including gel and S&S, artificial/acrylic nails, or any other type of covering on natural nails including finger and toenails. If you have artificial nails, gel coating, etc. that needs to be removed by a nail salon please have this removed prior to surgery or surgery may need to be canceled/ delayed if the surgeon/ anesthesia feels like they are unable to be safely monitored.   Do not shave  48 hours prior to surgery.    Do not bring valuables to the hospital. [facility] IS NOT             RESPONSIBLE   FOR VALUABLES.   Contacts, glasses, dentures or bridgework may not be worn into surgery.  DO NOT BRING YOUR HOME MEDICATIONS TO THE HOSPITAL. PHARMACY WILL DISPENSE MEDICATIONS LISTED ON YOUR MEDICATION LIST TO YOU DURING YOUR ADMISSION IN THE HOSPITAL!    Patients discharged on the day of surgery will not be allowed to drive home.  Someone NEEDS to stay with you for the first 24 hours after anesthesia.              Please read over the following fact sheets you were given: IF YOU HAVE QUESTIONS ABOUT YOUR PRE-OP INSTRUCTIONS PLEASE CALL [name]    If you received a COVID test during your pre-op visit  it is requested that you wear a mask when out in public, stay away from anyone that may not be feeling well and notify your surgeon if you develop symptoms. If you test positive for Covid or have been in contact with anyone that has tested positive in the last 10 days please notify you surgeon.    [place] - Preparing for Surgery Before surgery, you can play an important role.  Because skin is not sterile, your skin needs to be as free of germs as possible.  You can reduce the number of germs on your skin by washing with CHG (chlorahexidine gluconate) soap before surgery.  CHG is an antiseptic cleaner which kills germs and bonds with the skin to  continue killing germs even after washing. Please DO NOT use if you have an allergy to CHG or antibacterial soaps.  If your skin becomes reddened/irritated stop using the CHG and inform your nurse when you arrive at Short Stay. Do not shave (including legs and underarms) for at least 48 hours prior to the first CHG shower.  You may shave your face/neck.  Please follow these instructions carefully:  1.  Shower with CHG Soap the night before surgery and the  morning of surgery.  2.  If you choose to wash your hair, wash your hair first as usual with your normal  shampoo.  3.  After you shampoo, rinse your hair and body thoroughly to remove the shampoo.                             4.  Use CHG as you would any other liquid soap.  You can apply chg directly to the skin and wash.  Gently with a scrungie or clean washcloth.  5.  Apply the CHG Soap to your body ONLY FROM THE NECK DOWN.   Do   not use on face/ open                           Wound or open sores. Avoid contact with eyes, ears mouth and   genitals (private parts).                       Wash face,  Genitals (private parts) with your normal soap.             6.  Wash thoroughly, paying special attention to the area where your    surgery  will be performed.  7.  Thoroughly rinse your body with warm water from the neck down.  8.  DO NOT shower/wash with your normal soap after using and rinsing off the CHG Soap.                9.  Pat yourself dry with a clean towel.            10.  Wear clean pajamas.            11.  Place clean sheets on your bed the night of your first shower and do  not  sleep with pets. Day of Surgery : Do not apply any lotions/deodorants the morning of surgery.  Please wear clean clothes to the hospital/surgery center.  FAILURE TO FOLLOW THESE INSTRUCTIONS MAY RESULT IN THE CANCELLATION OF YOUR SURGERY  PATIENT SIGNATURE_________________________________  NURSE  SIGNATURE__________________________________  ________________________________________________________________________

## 2023-11-21 NOTE — H&P (Signed)
CC/HPI: cc: urinary incontinence   11/03/22: 48 year old woman with urinary incontinence referred by Dr. Cardell Peach for consideration of treatment for stress urinary continence. She has a extensive GYN history s/p hysterectomy and multiple laparoscopic procedures for endometriosis. Her last procedure was in April 2024. She also has abdominal hernias and has had mesh placement previously. Patient had 100 pound weight gain over the last year. She has stress urinary incontinence with no urge urinary incontinence. She had a urodynamic study that did not show stress urinary continence at all. She has declined pelvic floor physical therapy. She has severe anxiety regarding medical procedures and is nervous to do an in office cystoscopy.   UDS SUMMARY  Ms. Gina [name] held a max capacity of approx. 342 mls. Her 1st sensation was felt at 231 mls. No SUI noted. No instability was noted. She was able to generate a voluntary contraction and void 321 mls with max flow of 20 ml/s. Max detrusor pressure was 36 cmH20 while voiding. She had to strain and push down hard in order to void which caused her pressure line to fall out at the end of her void. She reported that she normally has no problems emptying her bladder. EMG leads were basically quiet during the voiding phase. PVR was approx. 20 mls. No trabeculation was noted. No reflux was seen. She will return for UDS follow up.     ALLERGIES: LaMICtal TABS NSAIDs Ondansetron    MEDICATIONS: Ativan 1 mg tablet  Cyclobenzaprine Hcl 5 mg tablet  Latuda 60 mg tablet  Lexapro  Oxycodone-Acetaminophen 10 mg-325 mg tablet  Promethazine Hcl 25 mg tablet     GU PSH: Complex cystometrogram, w/ void pressure and urethral pressure profile studies, any technique - 10/24/2023 Complex Uroflow - 10/24/2023 Emg surf Electrd - 10/24/2023 Inject For cystogram - 10/24/2023 Intrabd voidng Press - 10/24/2023     NON-GU PSH: No Non-GU PSH    GU PMH: Stress Incontinence - 10/24/2023, -  10/06/2023 Urge incontinence - 10/24/2023, - 10/06/2023      PMH Notes:  1898-11-01 00:00:00 - Note: Normal Routine History And Physical Adult   NON-GU PMH: Anxiety Arthritis Depression GERD    FAMILY HISTORY: No Family History    SOCIAL HISTORY: No Social History    REVIEW OF SYSTEMS:    GU Review Female:   Patient denies frequent urination, hard to postpone urination, burning /pain with urination, get up at night to urinate, leakage of urine, stream starts and stops, trouble starting your stream, have to strain to urinate, and being pregnant.  Gastrointestinal (Upper):   Patient denies nausea, vomiting, and indigestion/ heartburn.  Gastrointestinal (Lower):   Patient denies diarrhea and constipation.  Constitutional:   Patient denies fever, night sweats, weight loss, and fatigue.  Skin:   Patient denies skin rash/ lesion and itching.  Eyes:   Patient denies blurred vision and double vision.  Ears/ Nose/ Throat:   Patient denies sore throat and sinus problems.  Hematologic/Lymphatic:   Patient denies swollen glands and easy bruising.  Cardiovascular:   Patient denies leg swelling and chest pains.  Respiratory:   Patient denies cough and shortness of breath.  Endocrine:   Patient denies excessive thirst.  Musculoskeletal:   Patient denies back pain and joint pain.  Neurological:   Patient denies dizziness and headaches.  Psychologic:   Patient denies depression and anxiety.   VITAL SIGNS: None   MULTI-SYSTEM PHYSICAL EXAMINATION:    Constitutional: Well-nourished. No physical deformities. Normally developed. Good grooming.  Neck:  Neck symmetrical, not swollen. Normal tracheal position.  Respiratory: No labored breathing, no use of accessory muscles.   Skin: No paleness, no jaundice, no cyanosis. No lesion, no ulcer, no rash.  Neurologic / Psychiatric: Oriented to time, oriented to place, oriented to person. No depression, no anxiety, no agitation.  Eyes: Normal conjunctivae.  Normal eyelids.  Ears, Nose, Mouth, and Throat: Left ear no scars, no lesions, no masses. Right ear no scars, no lesions, no masses. Nose no scars, no lesions, no masses. Normal hearing. Normal lips.  Musculoskeletal: Normal gait and station of head and neck.     Complexity of Data:  Records Review:   Previous Patient Records, POC Tool  Urine Test Review:   Urinalysis  Urodynamics Review:   Review Urodynamics Tests   PROCEDURES:          Visit Complexity - G2211    ASSESSMENT:      ICD-10 Details  1 GU:   Stress Incontinence - N39.3 Chronic, Stable   PLAN:           Document Letter(s):  Created for Patient: Clinical Summary         Notes:   Stress urinary incontinence:  -I reviewed patient's urodynamic study which did not clearly show stress urinary incontinence. This sometimes happens and typically I will move forward with a cystoscopy and provocative maneuvers. Patient has severe anxiety regarding in office cystoscopy and would like to do this under sedation.  -As patient does not have significant stress urinary continence on urodynamic study I would avoid placing mesh at this point in time. We did discuss risks and benefits of Bulkamid. These include but are not limited to pain, bleeding, infection, urinary retention, need for repeat procedure, failure to improve symptoms.   Will schedule diagnostic cystoscopy, pelvic exam and Bulkamid under sedation.

## 2023-11-22 ENCOUNTER — Other Ambulatory Visit: Payer: Self-pay

## 2023-11-22 ENCOUNTER — Encounter (HOSPITAL_COMMUNITY): Payer: Self-pay | Admitting: Urology

## 2023-11-22 ENCOUNTER — Encounter (HOSPITAL_COMMUNITY)
Admission: RE | Disposition: A | Discharge status: Home / Self Care | Payer: Self-pay | Source: Home / Self Care | Attending: Urology

## 2023-11-22 ENCOUNTER — Ambulatory Visit (HOSPITAL_COMMUNITY): Payer: Medicare Other | Admitting: Anesthesiology

## 2023-11-22 ENCOUNTER — Ambulatory Visit (HOSPITAL_COMMUNITY): Payer: Medicare Other

## 2023-11-22 ENCOUNTER — Ambulatory Visit (HOSPITAL_COMMUNITY)
Admission: RE | Admit: 2023-11-22 | Discharge: 2023-11-22 | Disposition: A | Discharge status: Home / Self Care | Payer: Medicare Other | Attending: Urology | Admitting: Urology

## 2023-11-22 ENCOUNTER — Ambulatory Visit (HOSPITAL_COMMUNITY): Payer: Medicare Other | Admitting: Medical

## 2023-11-22 DIAGNOSIS — F418 Other specified anxiety disorders: Secondary | ICD-10-CM | POA: Insufficient documentation

## 2023-11-22 DIAGNOSIS — Z90710 Acquired absence of both cervix and uterus: Secondary | ICD-10-CM | POA: Diagnosis not present

## 2023-11-22 DIAGNOSIS — F319 Bipolar disorder, unspecified: Secondary | ICD-10-CM | POA: Diagnosis not present

## 2023-11-22 DIAGNOSIS — N393 Stress incontinence (female) (male): Secondary | ICD-10-CM

## 2023-11-22 HISTORY — PX: CYSTOSCOPY: SHX5120

## 2023-11-22 SURGERY — INJECTION, BULKING AGENT, URETHRA
Anesthesia: Monitor Anesthesia Care

## 2023-11-22 MED ORDER — FENTANYL CITRATE (PF) 100 MCG/2ML IJ SOLN
INTRAMUSCULAR | Status: DC | PRN
  Administered 2023-11-22 (×2): 50 ug via INTRAVENOUS

## 2023-11-22 MED ORDER — CHLORHEXIDINE GLUCONATE 0.12 % MT SOLN
15.0000 mL | Freq: Once | OROMUCOSAL | Status: DC
  Administered 2023-11-22: 15 mL via OROMUCOSAL

## 2023-11-22 MED ORDER — ORAL CARE MOUTH RINSE: 15.0000 mL | Freq: Once | OROMUCOSAL | Status: AC

## 2023-11-22 MED ORDER — GENTAMICIN SULFATE 40 MG/ML IJ SOLN
5.0000 mg/kg | Freq: Once | INTRAVENOUS | Status: AC
  Administered 2023-11-22: 390 mg via INTRAVENOUS
  Filled 2023-11-22: qty 9.75

## 2023-11-22 MED ORDER — ACETAMINOPHEN 160 MG/5ML PO SOLN: 325.0000 mg | ORAL | Status: DC | PRN

## 2023-11-22 MED ORDER — LACTATED RINGERS IV SOLN
INTRAVENOUS | Status: DC
Start: 2023-11-22 — End: 2023-11-22

## 2023-11-22 MED ORDER — FENTANYL CITRATE PF 50 MCG/ML IJ SOSY
25.0000 ug | PREFILLED_SYRINGE | INTRAMUSCULAR | Status: DC | PRN
  Administered 2023-11-22 (×2): 50 ug via INTRAVENOUS

## 2023-11-22 MED ORDER — ACETAMINOPHEN 325 MG PO TABS: 325.0000 mg | ORAL_TABLET | ORAL | Status: DC | PRN

## 2023-11-22 MED ORDER — DEXMEDETOMIDINE HCL IN NACL 80 MCG/20ML IV SOLN
INTRAVENOUS | Status: DC | PRN
  Administered 2023-11-22: 8 ug via INTRAVENOUS

## 2023-11-22 MED ORDER — FENTANYL CITRATE PF 50 MCG/ML IJ SOSY
PREFILLED_SYRINGE | INTRAMUSCULAR | Status: AC
  Filled 2023-11-22: qty 2

## 2023-11-22 MED ORDER — MIDAZOLAM HCL 2 MG/2ML IJ SOLN
INTRAMUSCULAR | Status: AC
  Filled 2023-11-22: qty 2

## 2023-11-22 MED ORDER — MEPERIDINE HCL 50 MG/ML IJ SOLN
6.2500 mg | INTRAMUSCULAR | Status: DC | PRN
Start: 2023-11-22 — End: 2023-11-22

## 2023-11-22 MED ORDER — MIDAZOLAM HCL 5 MG/5ML IJ SOLN
INTRAMUSCULAR | Status: DC | PRN
  Administered 2023-11-22: 2 mg via INTRAVENOUS

## 2023-11-22 MED ORDER — SODIUM CHLORIDE 0.9 % IR SOLN
Status: DC | PRN
  Administered 2023-11-22: 3000 mL

## 2023-11-22 MED ORDER — LIDOCAINE HCL (PF) 2 % IJ SOLN
INTRAMUSCULAR | Status: AC
  Filled 2023-11-22: qty 5

## 2023-11-22 MED ORDER — ONDANSETRON HCL 4 MG/2ML IJ SOLN: 4.0000 mg | Freq: Once | INTRAMUSCULAR | Status: DC | PRN

## 2023-11-22 MED ORDER — OXYCODONE HCL 5 MG/5ML PO SOLN: 5.0000 mg | Freq: Once | ORAL | Status: DC | PRN

## 2023-11-22 MED ORDER — DEXMEDETOMIDINE HCL IN NACL 80 MCG/20ML IV SOLN
INTRAVENOUS | Status: AC
  Filled 2023-11-22: qty 20

## 2023-11-22 MED ORDER — PROPOFOL 500 MG/50ML IV EMUL
INTRAVENOUS | Status: DC | PRN
  Administered 2023-11-22: 150 ug/kg/min via INTRAVENOUS

## 2023-11-22 MED ORDER — FENTANYL CITRATE (PF) 100 MCG/2ML IJ SOLN
INTRAMUSCULAR | Status: AC
  Filled 2023-11-22: qty 2

## 2023-11-22 MED ORDER — MIDAZOLAM HCL 2 MG/2ML IJ SOLN
2.0000 mg | Freq: Once | INTRAMUSCULAR | Status: AC
  Administered 2023-11-22: 2 mg via INTRAVENOUS
  Filled 2023-11-22: qty 2

## 2023-11-22 MED ORDER — HYDROMORPHONE HCL 1 MG/ML IJ SOLN
0.2500 mg | INTRAMUSCULAR | Status: DC | PRN
  Administered 2023-11-22 (×2): 0.5 mg via INTRAVENOUS

## 2023-11-22 MED ORDER — PROPOFOL 1000 MG/100ML IV EMUL
INTRAVENOUS | Status: AC
  Filled 2023-11-22: qty 100

## 2023-11-22 MED ORDER — HYDROMORPHONE HCL 1 MG/ML IJ SOLN
INTRAMUSCULAR | Status: AC
  Filled 2023-11-22: qty 1

## 2023-11-22 MED ORDER — FENTANYL CITRATE PF 50 MCG/ML IJ SOSY
PREFILLED_SYRINGE | INTRAMUSCULAR | Status: AC
  Filled 2023-11-22: qty 1

## 2023-11-22 MED ORDER — OXYCODONE HCL 5 MG PO TABS: 5.0000 mg | ORAL_TABLET | Freq: Once | ORAL | Status: DC | PRN

## 2023-11-22 SURGICAL SUPPLY — 12 items
BAG URO CATCHER STRL LF (MISCELLANEOUS) ×1 IMPLANT
CATH URETL OPEN END 6FR 70 (CATHETERS) ×1 IMPLANT
CLOTH BEACON ORANGE TIMEOUT ST (SAFETY) ×1 IMPLANT
GLOVE BIO SURGEON STRL SZ 6.5 (GLOVE) ×1 IMPLANT
GOWN STRL REUS W/ TWL LRG LVL3 (GOWN DISPOSABLE) ×1 IMPLANT
GUIDEWIRE STR DUAL SENSOR (WIRE) ×1 IMPLANT
KIT TURNOVER KIT A (KITS) IMPLANT
MANIFOLD NEPTUNE II (INSTRUMENTS) ×1 IMPLANT
PACK CYSTO (CUSTOM PROCEDURE TRAY) ×1 IMPLANT
SYSTEM URETHRAL BULK BULKAMID (Female Continence) IMPLANT
TUBING CONNECTING 10 (TUBING) ×1 IMPLANT
TUBING UROLOGY SET (TUBING) IMPLANT

## 2023-11-22 NOTE — Interval H&P Note (Signed)
History and Physical Interval Note:  11/22/2023 9:19 AM  Gina [name]  has presented today for surgery, with the diagnosis of STRESS URINARY INCONTINENCE.  The various methods of treatment have been discussed with the patient and family. After consideration of risks, benefits and other options for treatment, the patient has consented to  Procedure(s) with comments: URETHRAL BULKING (N/A) - 30 MINUTES NEEDED FOR CASE CYSTOSCOPY (N/A) as a surgical intervention.  The patient's history has been reviewed, patient examined, no change in status, stable for surgery.  I have reviewed the patient's chart and labs.  Questions were answered to the patient's satisfaction.     [place] Acklin D Crosley Stejskal

## 2023-11-22 NOTE — Transfer of Care (Signed)
Immediate Anesthesia Transfer of Care Note  Patient: Gina [name]  Procedure(s) Performed: URETHRAL BULKING CYSTOSCOPY  Patient Location: PACU  Anesthesia Type:MAC  Level of Consciousness: awake and alert   Airway & Oxygen Therapy: Patient Spontanous Breathing and Patient connected to face mask oxygen  Post-op Assessment: Report given to RN and Post -op Vital signs reviewed and stable  Post vital signs: Reviewed and stable  Last Vitals:  Vitals Value Taken Time  BP 98/77 11/22/23 0954  Temp    Pulse 78 11/22/23 0956  Resp 12 11/22/23 0956  SpO2 99 % 11/22/23 0956  Vitals shown include unfiled device data.  Last Pain:  Vitals:   11/22/23 0824  TempSrc:   PainSc: 0-No pain         Complications: No notable events documented.

## 2023-11-22 NOTE — Anesthesia Preprocedure Evaluation (Addendum)
Anesthesia Evaluation  Patient identified by MRN, date of birth, ID band Patient awake    Reviewed: Allergy & Precautions, H&P , NPO status , Patient's Chart, lab work & pertinent test results  History of Anesthesia Complications (+) history of anesthetic complications  Airway Mallampati: II  TM Distance: >3 FB Neck ROM: Full    Dental no notable dental hx. (+) Teeth Intact, Dental Advisory Given, Caps   Pulmonary neg pulmonary ROS, Current Smoker   Pulmonary exam normal breath sounds clear to auscultation       Cardiovascular Exercise Tolerance: Good negative cardio ROS Normal cardiovascular exam Rhythm:Regular Rate:Normal     Neuro/Psych  Headaches PSYCHIATRIC DISORDERS Anxiety Depression Bipolar Disorder    Neuromuscular disease negative neurological ROS  negative psych ROS   GI/Hepatic negative GI ROS, Neg liver ROS,GERD  Medicated,,  Endo/Other  negative endocrine ROS    Renal/GU Renal diseasenegative Renal ROS  negative genitourinary   Musculoskeletal negative musculoskeletal ROS (+)    Abdominal   Peds negative pediatric ROS (+)  Hematology negative hematology ROS (+)   Anesthesia Other Findings   Reproductive/Obstetrics negative OB ROS                             Anesthesia Physical Anesthesia Plan  ASA: 3  Anesthesia Plan: MAC   Post-op Pain Management: Minimal or no pain anticipated   Induction: Intravenous  PONV Risk Score and Plan: 3 and Ondansetron, Midazolam, Treatment may vary due to age or medical condition and Propofol infusion  Airway Management Planned: Natural Airway, Simple Face Mask and Mask  Additional Equipment: None  Intra-op Plan:   Post-operative Plan: Extubation in OR  Informed Consent: I have reviewed the patients History and Physical, chart, labs and discussed the procedure including the risks, benefits and alternatives for the proposed  anesthesia with the patient or authorized representative who has indicated his/her understanding and acceptance.     Dental advisory given  Plan Discussed with: CRNA and Anesthesiologist  Anesthesia Plan Comments: (  )        Anesthesia Quick Evaluation

## 2023-11-22 NOTE — Discharge Instructions (Signed)
Cystoscopy patient instructions  Following a cystoscopy, a catheter (a flexible rubber tube) is sometimes left in place to empty the bladder. This may cause some discomfort or a feeling that you need to urinate. Your doctor determines the period of time that the catheter will be left in place. You may have bloody urine for two to three days (Call your doctor if the amount of bleeding increases or does not subside).  You may pass blood clots in your urine, especially if you had a biopsy. It is not unusual to pass small blood clots and have some bloody urine a couple of weeks after your cystoscopy. Again, call your doctor if the bleeding does not subside. You may have: Dysuria (painful urination) Frequency (urinating often) Urgency (strong desire to urinate)  These symptoms are common especially if medicine is instilled into the bladder or a ureteral stent is placed. Avoiding alcohol and caffeine, such as coffee, tea, and chocolate, may help relieve these symptoms. Drink plenty of water, unless otherwise instructed. Your doctor may also prescribe an antibiotic or other medicine to reduce these symptoms.  Cystoscopy results are available soon after the procedure; biopsy results usually take two to four days. Your doctor will discuss the results of your exam with you. Before you go home, you will be given specific instructions for follow-up care. Special Instructions:   If you are going home with a catheter in place do not take a tub bath until removed by your doctor.   You may resume your normal activities.   Do not drive or operate machinery if you are taking narcotic pain medicine.   Be sure to keep all follow-up appointments with your doctor.   Call Your Doctor If: The catheter is not draining You have severe pain You are unable to urinate You have a fever over 101 You have severe bleeding

## 2023-11-22 NOTE — Op Note (Signed)
Operative Note   Preoperative diagnosis:  1.  Stress urinary incontinence   Postoperative diagnosis: 1.  Stress urinary incontinence   Procedure(s): 1.  Cystoscopy with injection of bulkamid   Surgeon: Kasandra Knudsen, MD   Assistants:  None   Anesthesia:  General   Complications:  None   EBL:  minimal   Specimens: 1. none   Drains/Catheters: 1.  none   Intraoperative findings:   Normal urethra Orthotopic right UO effluxing clear urine. 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   Description of procedure:   After risks and benefits of the procedure discussed with the patient, informed consent was obtained.  The patient was taken to the operating placed in the supine position.  Anesthesia was induced and antibiotics were administered.  The patient was then repositioned in the dorsolithotomy position.  She was prepped and draped in usual sterile fashion a timeout performed with the attending present.  A 21Fr rigid cystoscope was placed in urethral meatus and advanced into bladder under direct visualization. Cystoscopy took place and findings are noted above.  The cystoscope was then removed.  The cystoscope was assembled with the Bulkamid system.  It was then placed in the urethral meatus and advanced into the bladder under direct visualization.  Prior cystoscopy had been done which noted normal anatomic landmarks.  These were again verified during cystoscopy today.  The cystoscope was brought back to the bladder neck and the needle was advanced through the needle guide at the 1 o'clock position.  Once it was visualized and advanced it was rotated to the 5 o'clock position.  Bulkamid was then injected until bleb was seen.  This was then repeated at the 1 o'clock position in the 7 o'clock position until coaptation was noted.   This concluded the case.  The patient's bladder was  left with approximately 200 cc of sterile saline.  The patient emerged from anesthesia and was transferred the PACU in stable condition.   Plan:  Plan for patient to void in PACU prior to discharge.

## 2023-11-22 NOTE — Anesthesia Postprocedure Evaluation (Signed)
Anesthesia Post Note  Patient: Gina [name]  Procedure(s) Performed: URETHRAL BULKING CYSTOSCOPY     Patient location during evaluation: PACU Anesthesia Type: MAC Level of consciousness: awake and alert Pain management: pain level controlled Vital Signs Assessment: post-procedure vital signs reviewed and stable Respiratory status: spontaneous breathing, nonlabored ventilation, respiratory function stable and patient connected to nasal cannula oxygen Cardiovascular status: blood pressure returned to baseline and stable Postop Assessment: no apparent nausea or vomiting Anesthetic complications: no   No notable events documented.  Last Vitals:  Vitals:   11/22/23 1045 11/22/23 1057  BP: 110/76 115/81  Pulse: 70 63  Resp: 14 20  Temp: (!) 36.4 C 36.6 C  SpO2: 95% 100%    Last Pain:  Vitals:   11/22/23 1057  TempSrc: Oral  PainSc: 0-No pain                 Tennelle Taflinger

## 2023-11-23 ENCOUNTER — Encounter (HOSPITAL_COMMUNITY): Payer: Self-pay | Admitting: Urology

## 2023-11-29 ENCOUNTER — Telehealth: Payer: Self-pay | Admitting: Orthopedic Surgery

## 2023-11-29 NOTE — Telephone Encounter (Signed)
Patient called advised her left shoulder have gotten much worse. Patient asked if she can be worked into Dr. August Saucer or Leane Call schedule for a sooner appointment. The number to contact patient is [phone]

## 2023-11-30 NOTE — Telephone Encounter (Signed)
I called patient and advised. She states that she is terrified to have this shot as she had one in her right shoulder and that was the worst shot she ever had. She also states that she only got 3 days relief from that shot. I explained this is a different area, however, she would like to know if  you can guarantee that this is going to help and is this the only thing that she can try?  I did explain to patient that there would likely be no guarantee unless the injection is performed.   Please advise.

## 2023-11-30 NOTE — Telephone Encounter (Signed)
Yeah I cannot guarantee her pain will resolve but I think it is the most logical next step and her best shot at avoiding left shoulder surgery if this [facility] joint is causing this much pain

## 2023-11-30 NOTE — Telephone Encounter (Signed)
I spoke with patient. Appt scheduled for 1:30pm on Friday 12/02/2023 for injection.

## 2023-11-30 NOTE — Telephone Encounter (Signed)
I think at this point with her MRI findings and the recent x-rays she had at the ER, if she is having increased pain in this left shoulder then the next step would be trying an injection in the [facility] joint like we have discussed.  If she wants to do this, I am happy to do this on Friday

## 2023-12-02 ENCOUNTER — Other Ambulatory Visit: Payer: Self-pay

## 2023-12-02 ENCOUNTER — Ambulatory Visit: Payer: Medicare Other | Admitting: Surgical

## 2023-12-02 DIAGNOSIS — M25512 Pain in left shoulder: Secondary | ICD-10-CM

## 2023-12-02 DIAGNOSIS — M19012 Primary osteoarthritis, left shoulder: Secondary | ICD-10-CM

## 2023-12-02 DIAGNOSIS — G8929 Other chronic pain: Secondary | ICD-10-CM

## 2023-12-06 ENCOUNTER — Encounter: Payer: Self-pay | Admitting: Surgical

## 2023-12-06 MED ORDER — METHYLPREDNISOLONE ACETATE 40 MG/ML IJ SUSP
13.3300 mg | INTRAMUSCULAR | Status: AC | PRN
  Administered 2023-12-02: 13.33 mg via INTRA_ARTICULAR

## 2023-12-06 MED ORDER — LIDOCAINE HCL 1 % IJ SOLN
3.0000 mL | INTRAMUSCULAR | Status: AC | PRN
  Administered 2023-12-02: 3 mL

## 2023-12-06 MED ORDER — BUPIVACAINE HCL 0.25 % IJ SOLN
0.6600 mL | INTRAMUSCULAR | Status: AC | PRN
  Administered 2023-12-02: .66 mL via INTRA_ARTICULAR

## 2023-12-06 NOTE — Progress Notes (Signed)
   Procedure Note  Patient: Gina [name]             Date of Birth: 1976-02-02           MRN: [id number]             Visit Date: 12/02/2023  Procedures: Visit Diagnoses:  1. Chronic left shoulder pain     Medium Joint Inj: L acromioclavicular on 12/02/2023 3:26 PM Indications: diagnostic evaluation and pain Details: 25 G 1.5 in needle, ultrasound-guided superior approach Medications: 3 mL lidocaine 1 %; 0.66 mL bupivacaine 0.25 %; 13.33 mg methylPREDNISolone acetate 40 MG/ML Outcome: tolerated well, no immediate complications  Patient returns today for consideration of left AC joint injection.  We have discussed this in the past and with her MRI results and the location of her shoulder pain, think that this is mostly [facility] joint related.  She also could have some early frozen shoulder especially with some limitation of her shoulder from the [facility] joint pain.  She does not really tolerate a lot of shoulder range of motion on exam today but that seems to be more from pain rather than any stiffness from frozen shoulder.  We will see how she does with AC joint injection today.  This helped her for several days with her right shoulder prior to surgery and the injection was administered at a different clinic.  Recommended she reach out to [place] in 1 to 2 weeks on MyChart to let us know how the injection helped if any.  As an aside, her right shoulder is doing very well postoperatively and she has done very well with the early weeks of physical therapy.  She is very functional with her right shoulder compared with the left shoulder. Procedure, treatment alternatives, risks and benefits explained, specific risks discussed. Consent was given by the patient. Immediately prior to procedure a time out was called to verify the correct patient, procedure, equipment, support staff and site/side marked as required. Patient was prepped and draped in the usual sterile fashion.

## 2023-12-09 ENCOUNTER — Other Ambulatory Visit: Payer: Self-pay

## 2023-12-09 ENCOUNTER — Emergency Department (HOSPITAL_BASED_OUTPATIENT_CLINIC_OR_DEPARTMENT_OTHER): Payer: Medicare Other

## 2023-12-09 ENCOUNTER — Emergency Department (HOSPITAL_BASED_OUTPATIENT_CLINIC_OR_DEPARTMENT_OTHER)
Admission: EM | Admit: 2023-12-09 | Discharge: 2023-12-09 | Disposition: A | Discharge status: Home / Self Care | Payer: Medicare Other | Attending: Emergency Medicine | Admitting: Emergency Medicine

## 2023-12-09 DIAGNOSIS — R1032 Left lower quadrant pain: Secondary | ICD-10-CM | POA: Diagnosis present

## 2023-12-09 DIAGNOSIS — R112 Nausea with vomiting, unspecified: Secondary | ICD-10-CM | POA: Insufficient documentation

## 2023-12-09 DIAGNOSIS — R1084 Generalized abdominal pain: Secondary | ICD-10-CM | POA: Insufficient documentation

## 2023-12-09 LAB — CBC WITH DIFFERENTIAL/PLATELET
Abs Immature Granulocytes: 0.01 10*3/uL (ref 0.00–0.07)
Basophils Absolute: 0 10*3/uL (ref 0.0–0.1)
Basophils Relative: 0 %
Eosinophils Absolute: 0.1 10*3/uL (ref 0.0–0.5)
Eosinophils Relative: 2 %
HCT: 41.9 % (ref 36.0–46.0)
Hemoglobin: 14.3 g/dL (ref 12.0–15.0)
Immature Granulocytes: 0 %
Lymphocytes Relative: 40 %
Lymphs Abs: 3 10*3/uL (ref 0.7–4.0)
MCH: 31.5 pg (ref 26.0–34.0)
MCHC: 34.1 g/dL (ref 30.0–36.0)
MCV: 92.3 fL (ref 80.0–100.0)
Monocytes Absolute: 0.7 10*3/uL (ref 0.1–1.0)
Monocytes Relative: 9 %
Neutro Abs: 3.7 10*3/uL (ref 1.7–7.7)
Neutrophils Relative %: 49 %
Platelets: 227 10*3/uL (ref 150–400)
RBC: 4.54 MIL/uL (ref 3.87–5.11)
RDW: 12.7 % (ref 11.5–15.5)
WBC: 7.5 10*3/uL (ref 4.0–10.5)
nRBC: 0 % (ref 0.0–0.2)

## 2023-12-09 LAB — COMPREHENSIVE METABOLIC PANEL
ALT: 15 U/L (ref 0–44)
AST: 13 U/L — ABNORMAL LOW (ref 15–41)
Albumin: 4 g/dL (ref 3.5–5.0)
Alkaline Phosphatase: 109 U/L (ref 38–126)
Anion gap: 9 (ref 5–15)
BUN: 9 mg/dL (ref 6–20)
CO2: 25 mmol/L (ref 22–32)
Calcium: 8.9 mg/dL (ref 8.9–10.3)
Chloride: 103 mmol/L (ref 98–111)
Creatinine, Ser: 0.55 mg/dL (ref 0.44–1.00)
GFR, Estimated: >60 mL/min (ref >60)
Glucose, Bld: 94 mg/dL (ref 70–99)
Potassium: 3.9 mmol/L (ref 3.5–5.1)
Sodium: 137 mmol/L (ref 135–145)
Total Bilirubin: 0.4 mg/dL (ref 0.0–1.2)
Total Protein: 6.5 g/dL (ref 6.5–8.1)

## 2023-12-09 LAB — URINALYSIS, ROUTINE W REFLEX MICROSCOPIC
Bilirubin Urine: NEGATIVE
Glucose, UA: NEGATIVE mg/dL
Hgb urine dipstick: NEGATIVE
Ketones, ur: NEGATIVE mg/dL
Leukocytes,Ua: NEGATIVE
Nitrite: NEGATIVE
Specific Gravity, Urine: 1.031 — ABNORMAL HIGH (ref 1.005–1.030)
pH: 5.5 (ref 5.0–8.0)

## 2023-12-09 MED ORDER — SODIUM CHLORIDE 0.9 % IV SOLN
12.5000 mg | Freq: Four times a day (QID) | INTRAVENOUS | Status: DC | PRN
  Administered 2023-12-09: 12.5 mg via INTRAVENOUS
  Filled 2023-12-09: qty 0.5

## 2023-12-09 MED ORDER — DICYCLOMINE HCL 20 MG PO TABS: 20.0000 mg | ORAL_TABLET | Freq: Two times a day (BID) | ORAL | 0 refills | Status: DC

## 2023-12-09 MED ORDER — IOHEXOL 300 MG/ML  SOLN
100.0000 mL | Freq: Once | INTRAMUSCULAR | Status: AC | PRN
  Administered 2023-12-09: 100 mL via INTRAVENOUS

## 2023-12-09 MED ORDER — HYDROMORPHONE HCL 1 MG/ML IJ SOLN
0.5000 mg | Freq: Once | INTRAMUSCULAR | Status: AC
  Administered 2023-12-09: 0.5 mg via INTRAVENOUS
  Filled 2023-12-09: qty 1

## 2023-12-09 MED ORDER — PROMETHAZINE HCL 25 MG/ML IJ SOLN
INTRAMUSCULAR | Status: AC
  Filled 2023-12-09: qty 1

## 2023-12-09 MED ORDER — DICYCLOMINE HCL 10 MG PO CAPS
10.0000 mg | ORAL_CAPSULE | Freq: Once | ORAL | Status: AC
  Administered 2023-12-09: 10 mg via ORAL
  Filled 2023-12-09: qty 1

## 2023-12-09 NOTE — ED Triage Notes (Signed)
 Pt POV from home reporting L side flank pain and increased urination that began last night. Also reports constipation, small BM this morning. Took tylenol  with minimal improvement.

## 2023-12-09 NOTE — Discharge Instructions (Addendum)
 You have been seen today for your complaint of abdominal pain. Your lab work was reassuring. Your imaging was reassuring. Your discharge medications include bentyl . This is a medicine for abdominal pain. Home care instructions are as follows:  Use medicines to help with constipation Follow up with: your PCP Please seek immediate medical care if you develop any of the following symptoms: You cannot stop vomiting. Your pain is only in one part of the abdomen. Pain on the right side could be caused by appendicitis. You have bloody or black poop (stool), or poop that looks like tar. You have trouble breathing. You have chest pain. At this time there does not appear to be the presence of an emergent medical condition, however there is always the potential for conditions to change. Please read and follow the below instructions.  Do not take your medicine if  develop an itchy rash, swelling in your mouth or lips, or difficulty breathing; call 911 and seek immediate emergency medical attention if this occurs.  You may review your lab tests and imaging results in their entirety on your MyChart account.  Please discuss all results of fully with your primary care provider and other specialist at your follow-up visit.  Note: Portions of this text may have been transcribed using voice recognition software. Every effort was made to ensure accuracy; however, inadvertent computerized transcription errors may still be present.

## 2023-12-09 NOTE — ED Provider Notes (Signed)
 [place] EMERGENCY DEPARTMENT AT [facility] Provider Note   CSN: [id number] Arrival date & time: 12/09/23  1553     History  Chief Complaint  Patient presents with   Flank Pain    Gina [name] is a 48 y.o. female.  With a history of fibromyalgia, anxiety, bipolar disorder, panic disorder, arthritis presenting to the ED for evaluation of left flank pain.  Symptoms began approximately last night and have progressively worsened.  She went to physical therapy earlier today and states that this made the pain even worse.  She has been taking Tylenol  at home with no improvement.  Denies history of kidney stones but states that her husband has kidney stone.  Pain radiates into the left groin and the right lower quadrant.  She reports urinary frequency as well.  She denies fevers or chills.  She reports nausea with 1 episode of emesis.  She had a bowel movement this morning that was hard and difficult to pass.  She denies any shortness of breath or cough.   Flank Pain       Home Medications Prior to Admission medications   Medication Sig Start Date End Date Taking? Authorizing Provider  dicyclomine  (BENTYL ) 20 MG tablet Take 1 tablet (20 mg total) by mouth 2 (two) times daily. 12/09/23  Yes Dayna Geurts, Marsa HERO, PA-C  albuterol (VENTOLIN HFA) 108 (90 Base) MCG/ACT inhaler Inhale 2 puffs into the lungs every 6 (six) hours as needed for wheezing or shortness of breath. 11/04/23 05/02/24  [provider]  calcium carbonate (TUMS - DOSED IN MG ELEMENTAL CALCIUM) 500 MG chewable tablet Chew 1 tablet by mouth 3 (three) times daily as needed for indigestion or heartburn.    [provider]  cyclobenzaprine  (FLEXERIL ) 5 MG tablet Take 5 mg by mouth 3 (three) times daily.    [provider]  ELESTRIN 0.52 MG/0.87 GM (0.06%) GEL Apply 1 Application topically daily. 10/03/23   [provider]  escitalopram  (LEXAPRO ) 20 MG tablet Take 30 mg by mouth at  bedtime. 12/20/18   [provider]  LORazepam  (ATIVAN ) 1 MG tablet Take 1 mg by mouth every 8 (eight) hours as needed for anxiety.    [provider]  Lurasidone  HCl 60 MG TABS Take 60 mg by mouth daily. 12/16/22   [provider]  MOVANTIK 12.5 MG TABS tablet Take 12.5 mg by mouth daily.    [provider]  omeprazole (PRILOSEC) 40 MG capsule Take 40 mg by mouth daily.    [provider]  oxyCODONE -acetaminophen  (PERCOCET) 10-325 MG tablet Take 0.5-1 tablets by mouth every 8 (eight) hours as needed for pain. Patient not taking: Reported on 11/16/2023 10/31/23   Magnant, Carlin CROME, PA-C  promethazine  (PHENERGAN ) 25 MG tablet Take 1 tablet (25 mg total) by mouth every 6 (six) hours as needed for up to 15 doses for nausea or vomiting. Patient not taking: Reported on 11/16/2023 04/14/23   Cottie Donnice PARAS, MD  rizatriptan  (MAXALT -MLT) 10 MG disintegrating tablet Take 1 tablet (10 mg total) by mouth as needed. May repeat in 2 hours if needed Patient not taking: Reported on 11/16/2023 01/18/23   Onita Duos, MD      Allergies    Lamictal [lamotrigine], Morphine  and codeine , Nicotine , Rizatriptan , Zofran , Chantix  [varenicline  tartrate], Ciprofloxacin , Codeine , Haldol  [haloperidol ], Ibuprofen, Lidoderm  [lidocaine ], Metaxalone, Sulfa antibiotics, Sulfasalazine, Toradol  [ketorolac  tromethamine ], Doxycycline , Duloxetine, Duloxetine hcl, Gabapentin , Ingrezza [valbenazine tosylate], Methadone, Other, Prednisone , Pregabalin, Zolpidem, Zolpidem tartrate, Amoxicillin, Aripiprazole, Chlorhexidine , Penicillins,  Tramadol , and Valbenazine    Review of Systems   Review of Systems  Gastrointestinal:  Positive for nausea and vomiting.  Genitourinary:  Positive for flank pain and frequency.  All other systems reviewed and are negative.   Physical Exam Updated Vital Signs BP 122/75   Pulse 86   Temp 98.2 F (36.8 C) (Temporal)   Resp 19   Ht 5' 7 (1.702 m)   Wt 101.6 kg    LMP 01/01/2004   SpO2 99%   BMI 35.08 kg/m  Physical Exam Vitals and nursing note reviewed.  Constitutional:      General: She is not in acute distress.    Appearance: She is well-developed.     Comments: Resting in bed, appears uncomfortable  HENT:     Head: Normocephalic and atraumatic.  Eyes:     Conjunctiva/sclera: Conjunctivae normal.  Cardiovascular:     Rate and Rhythm: Normal rate and regular rhythm.     Heart sounds: No murmur heard. Pulmonary:     Effort: Pulmonary effort is normal. No respiratory distress.     Breath sounds: Normal breath sounds.  Abdominal:     Palpations: Abdomen is soft.     Tenderness: There is abdominal tenderness (Left lower quadrant). There is guarding. There is no right CVA tenderness or left CVA tenderness.  Musculoskeletal:        General: No swelling.     Cervical back: Neck supple.  Skin:    General: Skin is warm and dry.     Capillary Refill: Capillary refill takes less than 2 seconds.  Neurological:     Mental Status: She is alert.  Psychiatric:        Mood and Affect: Mood normal.     ED Results / Procedures / Treatments   Labs (all labs ordered are listed, but only abnormal results are displayed) Labs Reviewed  URINALYSIS, ROUTINE W REFLEX MICROSCOPIC - Abnormal; Notable for the following components:      Result Value   Specific Gravity, Urine 1.031 (*)    Protein, ur TRACE (*)    All other components within normal limits  COMPREHENSIVE METABOLIC PANEL - Abnormal; Notable for the following components:   AST 13 (*)    All other components within normal limits  CBC WITH DIFFERENTIAL/PLATELET    EKG None  Radiology CT ABDOMEN PELVIS W CONTRAST Result Date: 12/09/2023 CLINICAL DATA:  Left lower quadrant and left flank pain EXAM: CT ABDOMEN AND PELVIS WITH CONTRAST TECHNIQUE: Multidetector CT imaging of the abdomen and pelvis was performed using the standard protocol following bolus administration of intravenous  contrast. RADIATION DOSE REDUCTION: This exam was performed according to the departmental dose-optimization program which includes automated exposure control, adjustment of the mA and/or kV according to patient size and/or use of iterative reconstruction technique. CONTRAST:  OMNIPAQUE  IOHEXOL  300 MG/ML  SOLN COMPARISON:  08/23/2023 FINDINGS: Lower chest: No acute abnormality Hepatobiliary: No focal hepatic abnormality. Gallbladder unremarkable. Pancreas: No focal abnormality or ductal dilatation. Spleen: No focal abnormality.  Normal size. Adrenals/Urinary Tract: Adrenal glands normal. 1 cm cyst in the midpole of the right kidney. No follow-up imaging recommended. No stones or hydronephrosis. Urinary bladder unremarkable. Stomach/Bowel: Stomach, large and small bowel grossly unremarkable. Vascular/Lymphatic: No evidence of aneurysm or adenopathy. Reproductive: Prior hysterectomy.  No adnexal masses. Other: No free fluid or free air. Musculoskeletal: No acute bony abnormality. IMPRESSION: No renal or ureteral stones.  No hydronephrosis. No acute findings. Electronically Signed   By: Franky  Dover M.D.   On: 12/09/2023 19:07    Procedures Procedures    Medications Ordered in ED Medications  promethazine  (PHENERGAN ) 12.5 mg in sodium chloride  0.9 % 50 mL IVPB (0 mg Intravenous Stopped 12/09/23 1848)  promethazine  (PHENERGAN ) 25 MG/ML injection (  Not Given 12/09/23 1856)  dicyclomine  (BENTYL ) capsule 10 mg (has no administration in time range)  HYDROmorphone  (DILAUDID ) injection 0.5 mg (0.5 mg Intravenous Given 12/09/23 1815)  iohexol  (OMNIPAQUE ) 300 MG/ML solution 100 mL (100 mLs Intravenous Contrast Given 12/09/23 1838)    ED Course/ Medical Decision Making/ A&P                                 Medical Decision Making Amount and/or Complexity of Data Reviewed Labs: ordered. Radiology: ordered.  Risk Prescription drug management.  This patient presents to the ED for concern of left flank pain,  this involves an extensive number of treatment options, and is a complaint that carries with it a high risk of complications and morbidity. The differential diagnosis of emergent flank pain includes, but is not limited to :Abdominal aortic aneurysm,, Renal artery embolism,Renal vein thrombosis, Aortic dissection, Mesenteric ischemia, Pyelonephritis, Renal infarction, Renal hemorrhage, Nephrolithiasis/ Renal Colic, Bladder tumor,Cystitis, Biliary colic, Pancreatitis Perforated peptic ulcer Appendicitis ,Inguinal Hernia, Diverticulitis, Bowel obstruction Ectopic Pregnancy,PID/TOA,Ovarian cyst, Ovarian torsion Shingles Lower lobe pneumonia, Retroperitoneal hematoma/abscess/tumor, Epidural abscess, Epidural hematoma   My initial workup includes labs, imaging, symptom control  Additional history obtained from: Nursing notes from this visit.  I ordered, reviewed and interpreted labs which include: CBC, CMP, urinalysis urine without evidence of infection or hematuria.  No leukocytosis or anemia.  No electrolyte derangement or kidney dysfunction  I ordered imaging studies including CT abdomen pelvis I independently visualized and interpreted imaging which showed negative I agree with the radiologist interpretation  Afebrile, hemodynamically stable.  48 year old female presenting to the ED for evaluation of left-sided abdominal pain.  Symptoms began yesterday and worsened today.  Reports nausea with 1 episode of emesis as well.  She does appear comfortable on exam, however is exquisitely tender to palpation of the abdomen diffusely.  Laboratories reassuring.  CT abdomen pelvis negative.  On extensive chart review, it appears the patient presents fairly frequently for similar complaints.  She was given a dose of antiemetic and Dilaudid  in the emergency department and reported some improvement in her symptoms, however was then requesting ketamine  for her symptoms.  She is also requesting prescription for  opioids.  Do not believe these would benefit the patient.  Lower suspicion for acute emergent surgical pathology in the abdomen.  No evidence of ureterolithiasis.  Overall patient believes symptoms are due to constipation.  She was encouraged to use medications that she has used in the past for constipation.  She was sent a prescription for Bentyl  for her symptoms.  She was given return precautions.  Stable at discharge.  At this time there does not appear to be any evidence of an acute emergency medical condition and the patient appears stable for discharge with appropriate outpatient follow up. Diagnosis was discussed with patient who verbalizes understanding of care plan and is agreeable to discharge. I have discussed return precautions with patient who verbalizes understanding. Patient encouraged to follow-up with their PCP within 1 week.. All questions answered.  Note: Portions of this report may have been transcribed using voice recognition software. Every effort was made to ensure accuracy; however, inadvertent computerized transcription  errors may still be present.        Final Clinical Impression(s) / ED Diagnoses Final diagnoses:  Generalized abdominal pain    Rx / DC Orders ED Discharge Orders          Ordered    dicyclomine  (BENTYL ) 20 MG tablet  2 times daily        12/09/23 1937              Edwardo Marsa CHRISTELLA DEVONNA 12/09/23 1940    Doretha Folks, MD 12/09/23 2316

## 2023-12-14 ENCOUNTER — Ambulatory Visit: Payer: Medicare Other | Admitting: Orthopedic Surgery

## 2023-12-14 DIAGNOSIS — M19012 Primary osteoarthritis, left shoulder: Secondary | ICD-10-CM

## 2023-12-15 ENCOUNTER — Other Ambulatory Visit: Payer: Self-pay | Admitting: Orthopedic Surgery

## 2023-12-15 ENCOUNTER — Telehealth: Payer: Self-pay | Admitting: Orthopedic Surgery

## 2023-12-15 MED ORDER — HYDROCODONE-ACETAMINOPHEN 5-325 MG PO TABS: 1.0000 | ORAL_TABLET | Freq: Four times a day (QID) | ORAL | 0 refills | Status: DC | PRN

## 2023-12-15 NOTE — Telephone Encounter (Signed)
IC advised.

## 2023-12-15 NOTE — Telephone Encounter (Signed)
Patient called to check the status of a Rx for pain medication that she states that was to be called in yesterday at her visit.   [facility] on [address]

## 2023-12-15 NOTE — Telephone Encounter (Signed)
Done thx

## 2023-12-16 ENCOUNTER — Encounter: Payer: Self-pay | Admitting: Orthopedic Surgery

## 2023-12-16 NOTE — Progress Notes (Signed)
Office Visit Note   Patient: Gina [name]           Date of Birth: 12/08/1975           MRN: [id number] Visit Date: 12/14/2023 Requested by: Charolett Bumpers, PA-C [address] [place],  [place] [address] PCP: Bradley Ferris  Subjective: Chief Complaint  Patient presents with   Left Shoulder - Follow-up, Pain    HPI: Gina [name] is a 48 y.o. female who presents to the office reporting left shoulder pain.  Patient underwent left shoulder AC joint injection 12/02/2023 which did give her very good relief for 5 days.  The pain is back now to where it was before.  Has tried Tylenol without relief.  Has done well with her right shoulder surgery.  Hard for her to sleep on the left-hand side.  She does have some popping.  She is allergic to tramadol.  Had an MRI scan in December which did show some edema in that distal clavicle..                ROS: All systems reviewed are negative as they relate to the chief complaint within the history of present illness.  Patient denies fevers or chills.  Assessment & Plan: Visit Diagnoses:  1. Arthritis of left acromioclavicular joint     Plan: Impression is symptomatic left shoulder AC joint arthritis.  Not too much going on with the rotator cuff or labrum on the MRI scan.  She did have good relief from the [facility] joint injection.  I had a long talk with Khadeejah today about the risk and benefits of operative intervention including not limited to incomplete pain relief as well as incomplete restoration of function.  This may be a little bit more difficult for her to get over compared to her right shoulder surgery.  Nonetheless she would like to proceed with operative intervention sometime in March.  Patient understands risk and benefits.  All questions answered.  Follow-Up Instructions: No follow-ups on file.   Orders:  No orders of the defined types were placed in this encounter.  No orders of the defined types were placed in this  encounter.     Procedures: No procedures performed   Clinical Data: No additional findings.  Objective: Vital Signs: LMP 01/01/2004   Physical Exam:  Constitutional: Patient appears well-developed HEENT:  Head: Normocephalic Eyes:EOM are normal Neck: Normal range of motion Cardiovascular: Normal rate Pulmonary/chest: Effort normal Neurologic: Patient is alert Skin: Skin is warm Psychiatric: Patient has normal mood and affect  Ortho Exam: Ortho exam unchanged from prior visit.  She does have discrete AC joint tenderness with pain with crossarm adduction on that left-hand side.  Rotator cuff strength is intact.  No restriction of external rotation left shoulder versus right.  No coarse grinding or crepitus is present.  O'Brien's testing is equivocal on the left.  Specialty Comments:  Narrative & Impression CLINICAL DATA:  Low back pain, prior surgery, new symptoms. Ongoing low back pain for 2 months. No known injury. No prior surgery to area.   EXAM: MRI LUMBAR SPINE WITHOUT AND WITH CONTRAST   TECHNIQUE: Multiplanar and multiecho pulse sequences of the lumbar spine were obtained without and with intravenous contrast.   CONTRAST:  10mL GADAVIST GADOBUTROL 1 MMOL/ML IV SOLN   COMPARISON:  Lumbar MRI 04/02/2018. CT of the abdomen and pelvis 06/15/2023. Lumbar spine radiographs 09/18/2018.   FINDINGS: Segmentation: Conventional anatomy assumed, with the last  open disc space designated L5-S1.Concordant with prior imaging.   Alignment: Mild convex right scoliosis. No focal angulation or listhesis.   Vertebrae: No worrisome osseous lesion, acute fracture or pars defect. Chronic Schmorl's node in the inferior endplate of L1. No evidence of discitis or osteomyelitis. No obvious postsurgical changes in the lumbar spine.   Conus medullaris: Extends to the L1-2 level and appears normal. No abnormal intradural enhancement.   Paraspinal and other soft tissues: No  significant paraspinal findings.   Disc levels:   Sagittal images demonstrate no significant disc space findings within the visualized lower thoracic spine.   L1-2: Stable loss of disc height with disc bulging and endplate osteophytes. No spinal stenosis or nerve root encroachment.   L2-3: Normal interspace.   L3-4: Normal interspace.   L4-5: Normal interspace.   L5-S1: Normal interspace.   IMPRESSION: 1. No acute findings or explanation for the patient's symptoms. 2. Stable spondylosis at L1-2 without resulting spinal stenosis or nerve root encroachment.     Electronically Signed   By: Carey Bullocks M.D.   On: 07/29/2023 18:36  Imaging: No results found.   PMFS History: Patient Active Problem List   Diagnosis Date Noted   Chronic pain in left shoulder 11/01/2023   Calcific supraspinatus tendonitis 10/03/2023   Bursitis of right shoulder 10/03/2023   Arthritis of right acromioclavicular joint 10/03/2023   S/P arthroscopy of right shoulder 09/27/2023   Persistent headaches 01/18/2023   Blurry vision 01/18/2023   Weight gain 01/18/2023   SBO (small bowel obstruction) (HCC) 09/13/2022   Nausea and vomiting 09/11/2022   Partial small bowel obstruction (HCC) 09/10/2022   Piriformis syndrome of both sides 05/31/2019   History of bipolar disorder 04/18/2019   Tardive dyskinesia 04/18/2019   Urticaria due to drug allergy 04/18/2019   Lumbar spondylosis 02/05/2019   Bipolar affective disorder, current episode mixed (HCC) 01/02/2019   Arthritis 01/02/2019   Cigarette nicotine dependence without complication 01/02/2019   Bilateral temporomandibular joint pain 10/02/2018   Referred otalgia, bilateral 10/02/2018   Paresthesias 03/09/2018   Chronic migraine without aura, intractable, with status migrainosus 04/22/2016   Bipolar 1 disorder, mixed, moderate (HCC) 07/15/2015    Class: Chronic   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   Sciatica 01/09/2015    Reactive hypoglycemia 12/17/2013   Musculoskeletal malfunction arising from mental factors 04/04/2013   Incisional hernia 11/15/2012   Other postprocedural status(V45.89) 11/15/2012   S/P hernia surgery 11/15/2012   Abdominal pain 11/11/2012   Chronic pain syndrome 11/11/2012   Disorder of sacrum 11/11/2012   Chronic pain 12/21/2011   Heartburn 10/04/2011   DYSPHAGIA 10/04/2011   Depression with anxiety 09/15/2010   CONSTIPATION 09/15/2010   LUNG NODULE 06/17/2010   Herpes simplex virus (HSV) infection 06/11/2010   FATIGUE 06/11/2010   STRICTURE AND STENOSIS OF ESOPHAGUS 05/13/2010   ABDOMINAL WALL HERNIA 02/27/2010   ENDOMETRIOSIS 12/16/2009   GERD 10/23/2009   PALPITATIONS 10/23/2009   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   DYSTHYMIC DISORDER 06/20/2009   CONDYLOMA ACUMINATUM 04/23/2009   LENTIGO 04/23/2009   DENTAL PAIN 03/19/2009   CERVICAL RADICULOPATHY 12/11/2008   Cervical radiculopathy 12/11/2008   PANIC DISORDER WITHOUT AGORAPHOBIA 08/07/2008   TOBACCO ABUSE 08/07/2008   BACK PAIN, THORACIC REGION 07/07/2007   ALLERGIC RHINITIS 06/02/2007   Past Medical History:  Diagnosis Date   ABDOMINAL WALL HERNIA 02/27/2010   Abnormal Pap smear    ALLERGIC RHINITIS 06/02/2007   ANOREXIA, CHRONIC 08/07/2008   ANXIETY 09/15/2010  Arthritis    BACK PAIN, THORACIC REGION 07/07/2007   Bipolar disorder (HCC)    CERVICAL RADICULOPATHY 12/11/2008   Complication of anesthesia    Pateint reprots a high tolerance   Condyloma acuminatum 04/23/2009   CONSTIPATION 09/15/2010   DYSPHAGIA UNSPECIFIED 09/09/2009   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   Eating disorder    ENDOMETRIOSIS 12/16/2009   FATIGUE 06/11/2010   Fibromyalgia    GERD 10/23/2009   Heart palpitations    HERPES SIMPLEX INFECTION 06/11/2010   LENTIGO 04/23/2009   LUNG NODULE 06/17/2010   Narcotic abuse, continuous (HCC)    Ovarian cyst    Palpitations 10/23/2009   Panic disorder    Renal disorder    pt  denies   Rheumatoid arthritis(714.0)    Stricture and stenosis of esophagus 05/13/2010   TOBACCO ABUSE 08/07/2008   Urinary tract infection    UTI (urinary tract infection)     Family History  Problem Relation Age of Onset   Depression Mother    Arthritis Mother    Hyperlipidemia Father    Hypertension Father     Past Surgical History:  Procedure Laterality Date   ABDOMINAL SURGERY     ABDOMINAL WALL MESH  REMOVAL     ABLATION ON ENDOMETRIOSIS     CESAREAN SECTION     x2    CYSTOSCOPY N/A 11/22/2023   Procedure: CYSTOSCOPY;  Surgeon: Noel Christmas, MD;  Location: [facility];  Service: Urology;  Laterality: N/A;   DILATION AND CURETTAGE OF UTERUS     x1    ENDOMETRIAL ABLATION     esophageal dilatation x 4     GUM SURGERY     HERNIA REPAIR     X3   PARTIAL HYSTERECTOMY     SHOULDER SURGERY Right    Social History   Occupational History   Not on file  Tobacco Use   Smoking status: Every Day    Current packs/day: 0.20    Types: Cigarettes   Smokeless tobacco: Current  Vaping Use   Vaping status: Every Day  Substance and Sexual Activity   Alcohol use: No   Drug use: No   Sexual activity: Yes    Birth control/protection: Surgical

## 2023-12-23 ENCOUNTER — Emergency Department (HOSPITAL_BASED_OUTPATIENT_CLINIC_OR_DEPARTMENT_OTHER)
Admission: EM | Admit: 2023-12-23 | Discharge: 2023-12-24 | Disposition: A | Discharge status: Home / Self Care | Payer: Medicare Other | Attending: Emergency Medicine | Admitting: Emergency Medicine

## 2023-12-23 ENCOUNTER — Telehealth: Payer: Self-pay | Admitting: Orthopedic Surgery

## 2023-12-23 ENCOUNTER — Other Ambulatory Visit: Payer: Self-pay

## 2023-12-23 DIAGNOSIS — G8929 Other chronic pain: Secondary | ICD-10-CM | POA: Diagnosis not present

## 2023-12-23 DIAGNOSIS — M25512 Pain in left shoulder: Secondary | ICD-10-CM | POA: Diagnosis not present

## 2023-12-23 DIAGNOSIS — M6240 Contracture of muscle, unspecified site: Secondary | ICD-10-CM | POA: Diagnosis not present

## 2023-12-23 DIAGNOSIS — M25511 Pain in right shoulder: Secondary | ICD-10-CM | POA: Diagnosis not present

## 2023-12-23 DIAGNOSIS — Z720 Tobacco use: Secondary | ICD-10-CM | POA: Insufficient documentation

## 2023-12-23 DIAGNOSIS — M797 Fibromyalgia: Secondary | ICD-10-CM | POA: Insufficient documentation

## 2023-12-23 DIAGNOSIS — M542 Cervicalgia: Secondary | ICD-10-CM | POA: Diagnosis present

## 2023-12-23 DIAGNOSIS — M6289 Other specified disorders of muscle: Secondary | ICD-10-CM

## 2023-12-23 MED ORDER — PROMETHAZINE HCL 25 MG PO TABS
25.0000 mg | ORAL_TABLET | Freq: Once | ORAL | Status: DC
  Filled 2023-12-23: qty 1

## 2023-12-23 MED ORDER — SODIUM CHLORIDE 0.9 % IV SOLN
12.5000 mg | Freq: Once | INTRAVENOUS | Status: AC
  Administered 2023-12-23: 12.5 mg via INTRAVENOUS
  Filled 2023-12-23: qty 0.5

## 2023-12-23 MED ORDER — PROMETHAZINE HCL 25 MG/ML IJ SOLN
INTRAMUSCULAR | Status: AC
  Filled 2023-12-23: qty 1

## 2023-12-23 MED ORDER — METHOCARBAMOL 1000 MG/10ML IJ SOLN
1000.0000 mg | Freq: Once | INTRAMUSCULAR | Status: AC
  Administered 2023-12-23: 1000 mg via INTRAVENOUS
  Filled 2023-12-23: qty 10

## 2023-12-23 MED ORDER — DEXAMETHASONE SODIUM PHOSPHATE 10 MG/ML IJ SOLN
10.0000 mg | Freq: Once | INTRAMUSCULAR | Status: AC
  Administered 2023-12-23: 10 mg via INTRAVENOUS
  Filled 2023-12-23: qty 1

## 2023-12-23 NOTE — ED Provider Notes (Signed)
 [place] EMERGENCY DEPARTMENT AT [facility] Provider Note   CSN: [id number] Arrival date & time: 12/23/23  1604     History  Chief Complaint  Patient presents with   Neck Pain   Back Pain    Gina [name] is a 48 y.o. female with past medical history of bipolar 1 disorder, tobacco use, migraines, fibromyalgia presents to emergency department for evaluation of multiple areas of pain.  She endorses pain at the base of her neck, bilateral shoulder blades, bilateral arms, bilateral legs with associated headache.  Headache is diffuse in nature with no visual disturbances.  She also endorses swollen joints of hands and feet with bilateral feet tingling.  She believes that all of these pain started when her grandson attempted to help her from the neck slowly about 5 days ago. She states she is in a "fibromyalgia lockdown" and is interested in IV muscle relaxers and steroids.  She has been seeing PT for fibromyalgia with her last visit today.  He attempted to correct her neck but had to stop due to her being so tense.  She used to see pain management but not currently using it as PT has been good for her chronic pain.  She has chronic bilateral shoulder pain at baseline and is planning to have surgery of the left shoulder soon.  For her pain, she switches between Flexeril and Robaxin .  Currently, she is on Flexeril and stating that this does not help her muscle spasms.  Today, she has had Flexeril and hydrocodone 5 mg following PT without relief to pain.    Neck Pain Back Pain      Home Medications Prior to Admission medications   Medication Sig Start Date End Date Taking? Authorizing Provider  methocarbamol (ROBAXIN) 500 MG tablet Take 1 tablet (500 mg total) by mouth 2 (two) times daily. 12/24/23  Yes Judithann Sheen, PA  albuterol (VENTOLIN HFA) 108 (90 Base) MCG/ACT inhaler Inhale 2 puffs into the lungs every 6 (six) hours as needed for wheezing or shortness of breath.  11/04/23 05/02/24  [provider]  calcium carbonate (TUMS - DOSED IN MG ELEMENTAL CALCIUM) 500 MG chewable tablet Chew 1 tablet by mouth 3 (three) times daily as needed for indigestion or heartburn.    [provider]  cyclobenzaprine (FLEXERIL) 5 MG tablet Take 5 mg by mouth 3 (three) times daily.    [provider]  dicyclomine (BENTYL) 20 MG tablet Take 1 tablet (20 mg total) by mouth 2 (two) times daily. 12/09/23   Schutt, Edsel Petrin, PA-C  ELESTRIN 0.52 MG/0.87 GM (0.06%) GEL Apply 1 Application topically daily. 10/03/23   [provider]  escitalopram (LEXAPRO) 20 MG tablet Take 30 mg by mouth at bedtime. 12/20/18   [provider]  HYDROcodone-acetaminophen (NORCO/VICODIN) 5-325 MG tablet Take 1 tablet by mouth every 6 (six) hours as needed for moderate pain (pain score 4-6). 12/15/23   Cammy Copa, MD  LORazepam (ATIVAN) 1 MG tablet Take 1 mg by mouth every 8 (eight) hours as needed for anxiety.    [provider]  Lurasidone HCl 60 MG TABS Take 60 mg by mouth daily. 12/16/22   [provider]  MOVANTIK 12.5 MG TABS tablet Take 12.5 mg by mouth daily.    [provider]  omeprazole (PRILOSEC) 40 MG capsule Take 40 mg by mouth daily.    [provider]  oxyCODONE-acetaminophen (PERCOCET) 10-325 MG tablet Take 0.5-1 tablets by mouth every  8 (eight) hours as needed for pain. Patient not taking: Reported on 11/16/2023 10/31/23   Magnant, Joycie Peek, PA-C  promethazine (PHENERGAN) 25 MG tablet Take 1 tablet (25 mg total) by mouth every 6 (six) hours as needed for up to 15 doses for nausea or vomiting. Patient not taking: Reported on 11/16/2023 04/14/23   Terald Sleeper, MD  rizatriptan (MAXALT-MLT) 10 MG disintegrating tablet Take 1 tablet (10 mg total) by mouth as needed. May repeat in 2 hours if needed Patient not taking: Reported on 11/16/2023 01/18/23   Levert Feinstein, MD      Allergies    Lamictal [lamotrigine],  Morphine and codeine, Nicotine, Rizatriptan, Zofran, Chantix [varenicline tartrate], Ciprofloxacin, Codeine, Haldol [haloperidol], Ibuprofen, Lidoderm [lidocaine], Metaxalone, Sulfa antibiotics, Sulfasalazine, Toradol [ketorolac tromethamine], Doxycycline, Duloxetine, Duloxetine hcl, Gabapentin, Ingrezza [valbenazine tosylate], Methadone, Other, Prednisone, Pregabalin, Zolpidem, Zolpidem tartrate, Amoxicillin, Aripiprazole, Chlorhexidine, Penicillins, Tramadol, and Valbenazine    Review of Systems   Review of Systems  Musculoskeletal:  Positive for back pain and neck pain.    Physical Exam Updated Vital Signs BP 109/62   Pulse 75   Temp 98 F (36.7 C) (Oral)   Resp 17   Ht 5\' 7"  (1.702 m)   Wt 101.6 kg   LMP 01/01/2004   SpO2 94%   BMI 35.08 kg/m  Physical Exam Vitals and nursing note reviewed.  Constitutional:      General: She is not in acute distress.    Appearance: Normal appearance.  HENT:     Head: Normocephalic and atraumatic.  Eyes:     Conjunctiva/sclera: Conjunctivae normal.  Cardiovascular:     Rate and Rhythm: Normal rate.     Pulses: Normal pulses.     Heart sounds: Normal heart sounds.  Pulmonary:     Effort: Pulmonary effort is normal. No respiratory distress.     Breath sounds: Normal breath sounds.  Abdominal:     General: Bowel sounds are normal. There is no distension.     Palpations: Abdomen is soft.     Tenderness: There is no abdominal tenderness. There is no guarding.  Musculoskeletal:       Arms:     Cervical back: Normal range of motion and neck supple. No rigidity or tenderness.     Comments: Tenderness is in paraspinous musculature as noted above  Skin:    Coloration: Skin is not jaundiced or pale.     Comments: No infectious appearing skin nor joints.  No gross swelling, ecchymosis, erythema, warmth to BUE   Neurological:     Mental Status: She is alert and oriented to person, place, and time. Mental status is at baseline.     Sensory: No  sensory deficit.     Comments: Dermatomes C3-T2 intact BUE.  5/5 grip strength bilaterally Acting appropriately.  Ambulates without difficulty     ED Results / Procedures / Treatments   Labs (all labs ordered are listed, but only abnormal results are displayed) Labs Reviewed - No data to display  EKG None  Radiology No results found.  Procedures Procedures    Medications Ordered in ED Medications  methocarbamol (ROBAXIN) injection 1,000 mg (1,000 mg Intravenous Given 12/23/23 2243)  dexamethasone (DECADRON) injection 10 mg (10 mg Intravenous Given 12/23/23 2243)  promethazine (PHENERGAN) 12.5 mg in sodium chloride 0.9 % 50 mL IVPB (0 mg Intravenous Stopped 12/23/23 2334)  promethazine (PHENERGAN) 25 MG/ML injection (  Given 12/23/23 2333)  HYDROmorphone (DILAUDID) injection 1 mg (1 mg Intravenous Given 12/24/23  Lux.Amos)    ED Course/ Medical Decision Making/ A&P                                 Medical Decision Making Risk Prescription drug management.   Patient presents to the ED for concern of multiple areas of pain, this involves an extensive number of treatment options, and is a complaint that carries with it a high risk of complications and morbidity.  The differential diagnosis includes fibromyalgia flare, chronic pain, pain due to needing surgery   Co morbidities that complicate the patient evaluation  Fibromyalgia, upcoming surgery, chronic shoulders pain   Additional history obtained:  Additional history obtained from Family, Nursing, and Outside Medical Records   External records from outside source obtained and reviewed including  Is been at bedside Triage RN note Recent medical evaluations    Medicines ordered and prescription drug management:  I ordered medication including Robaxin, Decadron, Dilaudid for pain  Reevaluation of the patient after these medicines showed that the patient improved I have reviewed the patients home medicines and have made  adjustments as needed    Problem List / ED Course:  Fibromyalgia chronic pain of both shoulders Is scheduled to have surgery on left shoulder first then right shoulder Used to follow with pain management but has not been going due to being successful with PT Will aid her with pain today as she does not have numerous ED visits for this.  I attempted to start with Robaxin and steroid shot to see if this helps however she continued to endorse some pain Discussed symptomatic care at home with Tylenol, lidocaine patches, ice, heat, stretches.  Will provide short course of muscle relaxer prescription Discussed following up with pain management and PT for further care Muscle tightness   Reevaluation:  After the interventions noted above, I reevaluated the patient and found that they have :improved   Dispostion:  After consideration of the diagnostic results and the patients response to treatment, I feel that the patent would benefit from outpatient management.   Discussed patient with Dr. Deretha Emory who agrees with plan Final Clinical Impression(s) / ED Diagnoses Final diagnoses:  Other chronic pain  Fibromyalgia  Chronic pain of both shoulders  Muscle tightness    Rx / DC Orders ED Discharge Orders          Ordered    methocarbamol (ROBAXIN) 500 MG tablet  2 times daily        12/24/23 0017              Judithann Sheen, PA 12/25/23 0041    Vanetta Mulders, MD 01/07/24 [phone]

## 2023-12-23 NOTE — Telephone Encounter (Signed)
It looks like she is at the ER today for pain control.  If they are not able to get the pain under control, I would recommend she reach out to [place] for further evaluation next week

## 2023-12-23 NOTE — ED Triage Notes (Signed)
Pt via pov from home with neck and back pain and spasms since Saturday. Pt reports that she is in "a fibromyalgia lockdown" and is in severe pain. Pt states when this happens, she normally gets IV medications  and is better. Pt alert & oriented, nad noted.

## 2023-12-23 NOTE — Telephone Encounter (Signed)
Patient called. Has a concern about her pain medication. It's not working. Would like a call.

## 2023-12-23 NOTE — ED Notes (Signed)
Pt used call bell to request provider. Provider made aware

## 2023-12-23 NOTE — ED Notes (Signed)
Initial contact made. Pt is resting in bed. Pt states she has been having a fibromyalgia flare up that has been getting progressively worse for the last few days. Pt states her pain is 8-9/10 and primarily in her neck and shoulders

## 2023-12-23 NOTE — ED Notes (Signed)
Checked on pt, pt stated she was in severe pain. Provider made aware

## 2023-12-24 MED ORDER — HYDROMORPHONE HCL 1 MG/ML IJ SOLN
1.0000 mg | Freq: Once | INTRAMUSCULAR | Status: AC
  Administered 2023-12-24: 1 mg via INTRAVENOUS
  Filled 2023-12-24: qty 1

## 2023-12-24 MED ORDER — METHOCARBAMOL 500 MG PO TABS: 500.0000 mg | ORAL_TABLET | Freq: Two times a day (BID) | ORAL | 0 refills | Status: DC

## 2023-12-24 NOTE — Discharge Instructions (Addendum)
 Thank you for letting us evaluate you today.  We have given you antinausea medicine, muscle relaxer, pain medicine, 3-day steroid injections here in the emergency department for your symptoms.  I have also provided you with a prescription for Robaxin -do not take this when drinking alcohol, operating heavy machinery, or taking Flexeril as this is something that can make you extremely drowsy and can be dangerous.  Please do not take this until tomorrow a.m. as we have given you a dose here in the emergency department.  Please continue with lidocaine patches, Voltaren gel, Tylenol, PT  Please follow-up with your primary care provider, pain management, orthopedic surgery providers regarding chronic pain for further management to avoid another emergency department visit and to avoid symptoms becoming unbearable

## 2023-12-26 ENCOUNTER — Telehealth: Payer: Self-pay | Admitting: Orthopedic Surgery

## 2023-12-26 NOTE — Telephone Encounter (Signed)
 Patient request something stronger for pain

## 2023-12-26 NOTE — Telephone Encounter (Signed)
 No.  Nothing stronger

## 2023-12-29 NOTE — Telephone Encounter (Signed)
 Patient wants surgery sooner, is this possible?

## 2024-01-04 ENCOUNTER — Telehealth: Payer: Self-pay | Admitting: Orthopedic Surgery

## 2024-01-04 ENCOUNTER — Other Ambulatory Visit: Payer: Self-pay | Admitting: Surgical

## 2024-01-04 MED ORDER — HYDROCODONE-ACETAMINOPHEN 5-325 MG PO TABS: 1.0000 | ORAL_TABLET | Freq: Two times a day (BID) | ORAL | 0 refills | Status: DC | PRN

## 2024-01-04 NOTE — Telephone Encounter (Signed)
 Sent in

## 2024-01-04 NOTE — Telephone Encounter (Signed)
 Patient called. She would like some pain medication called in for her. Her cb# [phone]

## 2024-01-13 NOTE — Progress Notes (Signed)
 Surgical Instructions   Your procedure is scheduled on Tuesday, March 25, 25. Report to [facility] Main Entrance "A" at 9:40 A.M., then check in with the Admitting office. Any questions or running late day of surgery: call [phone]  Questions prior to your surgery date: call [phone], Monday-Friday, 8am-4pm. If you experience any cold or flu symptoms such as cough, fever, chills, shortness of breath, etc. between now and your scheduled surgery, please notify us at the above number.     Remember:  Do not eat after midnight the night before your surgery   You may drink clear liquids until 8:40 the morning of your surgery.   Clear liquids allowed are: Water, Non-Citrus Juices (without pulp), Carbonated Beverages, Clear Tea (no milk, honey, etc.), Black Coffee Only (NO MILK, CREAM OR POWDERED CREAMER of any kind), and Gatorade.    Patient Instructions  The night before surgery:  No food after midnight. ONLY clear liquids after midnight  The day of surgery:  Drink ONE (1) Pre-Surgery Clear Ensure by 8:40 the morning of surgery. Drink in one sitting. Do not sip.  This drink was given to you during your hospital pre-op appointment visit.  Nothing else to drink after completing the Pre-Surgery Clear Ensure.        If you have questions, please contact your surgeon's office.    Take these medicines the morning of surgery with A SIP OF WATER  escitalopram (LEXAPRO)  omeprazole (PRILOSEC)    May take these medicines IF NEEDED: albuterol (VENTOLIN HFA) inhaler - please bring with you on day of surgery  LORazepam (ATIVAN)  HYDROcodone-acetaminophen (NORCO/VICODIN)  methocarbamol (ROBAXIN)  promethazine (PHENERGAN)    One week prior to surgery, STOP taking any Aspirin (unless otherwise instructed by your surgeon) Aleve, Naproxen, Ibuprofen, Motrin, Advil, Goody's, BC's, all herbal medications, fish oil, and non-prescription vitamins.                     Do NOT Smoke  (Tobacco/Vaping) for 24 hours prior to your procedure.  If you use a CPAP at night, you may bring your mask/headgear for your overnight stay.   You will be asked to remove any contacts, glasses, piercing's, hearing aid's, dentures/partials prior to surgery. Please bring cases for these items if needed.    Patients discharged the day of surgery will not be allowed to drive home, and someone needs to stay with them for 24 hours.  SURGICAL WAITING ROOM VISITATION Patients may have no more than 2 support people in the waiting area - these visitors may rotate.   Pre-op nurse will coordinate an appropriate time for 1 ADULT support person, who may not rotate, to accompany patient in pre-op.  Children under the age of 23 must have an adult with them who is not the patient and must remain in the main waiting area with an adult.  If the patient needs to stay at the hospital during part of their recovery, the visitor guidelines for inpatient rooms apply.  Please refer to the [facility] website for the visitor guidelines for any additional information.   If you received a COVID test during your pre-op visit  it is requested that you wear a mask when out in public, stay away from anyone that may not be feeling well and notify your surgeon if you develop symptoms. If you have been in contact with anyone that has tested positive in the last 10 days please notify you surgeon.  Pre-operative CHG Bathing Instructions   You can play a key role in reducing the risk of infection after surgery. Your skin needs to be as free of germs as possible. You can reduce the number of germs on your skin by washing with CHG (chlorhexidine gluconate) soap before surgery. CHG is an antiseptic soap that kills germs and continues to kill germs even after washing.   DO NOT use if you have an allergy to chlorhexidine/CHG or antibacterial soaps. If your skin becomes reddened or irritated, stop using the CHG and notify one of  our RNs at [phone].              TAKE A SHOWER THE NIGHT BEFORE SURGERY AND THE DAY OF SURGERY    Please keep in mind the following:  DO NOT shave, including legs and underarms, 48 hours prior to surgery.   You may shave your face before/day of surgery.  Place clean sheets on your bed the night before surgery Use a clean washcloth (not used since being washed) for each shower. DO NOT sleep with pet's night before surgery.  CHG Shower Instructions:  Wash your face and private area with normal soap. If you choose to wash your hair, wash first with your normal shampoo.  After you use shampoo/soap, rinse your hair and body thoroughly to remove shampoo/soap residue.  Turn the water OFF and apply half the bottle of CHG soap to a CLEAN washcloth.  Apply CHG soap ONLY FROM YOUR NECK DOWN TO YOUR TOES (washing for 3-5 minutes)  DO NOT use CHG soap on face, private areas, open wounds, or sores.  Pay special attention to the area where your surgery is being performed.  If you are having back surgery, having someone wash your back for you may be helpful. Wait 2 minutes after CHG soap is applied, then you may rinse off the CHG soap.  Pat dry with a clean towel  Put on clean pajamas    Additional instructions for the day of surgery: DO NOT APPLY any lotions, deodorants, or perfumes.   Do not wear jewelry or makeup Do not wear nail polish, gel polish, artificial nails, or any other type of covering on natural nails (fingers and toes) Do not bring valuables to the hospital. [facility] is not responsible for valuables/personal belongings. Put on clean/comfortable clothes.  Please brush your teeth.  Ask your nurse before applying any prescription medications to the skin.

## 2024-01-13 NOTE — Progress Notes (Signed)
 Surgical Instructions   Your procedure is scheduled on Tuesday, March 25, 25. Report to [facility] Main Entrance "A" at 9:40 A.M., then check in with the Admitting office. Any questions or running late day of surgery: call [phone]  Questions prior to your surgery date: call [phone], Monday-Friday, 8am-4pm. If you experience any cold or flu symptoms such as cough, fever, chills, shortness of breath, etc. between now and your scheduled surgery, please notify us at the above number.     Remember:  Do not eat after midnight the night before your surgery   You may drink clear liquids until 8:40 the morning of your surgery.   Clear liquids allowed are: Water, Non-Citrus Juices (without pulp), Carbonated Beverages, Clear Tea (no milk, honey, etc.), Black Coffee Only (NO MILK, CREAM OR POWDERED CREAMER of any kind), and Gatorade.    Patient Instructions  The night before surgery:  No food after midnight. ONLY clear liquids after midnight  The day of surgery:  Drink ONE (1) Pre-Surgery Clear Ensure by 8:40 the morning of surgery. Drink in one sitting. Do not sip.  This drink was given to you during your hospital pre-op appointment visit.  Nothing else to drink after completing the Pre-Surgery Clear Ensure.        If you have questions, please contact your surgeon's office.    Take these medicines the morning of surgery with A SIP OF WATER  methocarbamol (ROBAXIN)  omeprazole (PRILOSEC)    May take these medicines IF NEEDED: albuterol (VENTOLIN HFA) inhaler - please bring with you on day of surgery  LORazepam (ATIVAN)  HYDROcodone-acetaminophen (NORCO/VICODIN)  promethazine (PHENERGAN)    One week prior to surgery, STOP taking any Aspirin (unless otherwise instructed by your surgeon) Aleve, Naproxen, Ibuprofen, Motrin, Advil, Goody's, BC's, all herbal medications, fish oil, and non-prescription vitamins.                     Do NOT Smoke (Tobacco/Vaping) for 24 hours  prior to your procedure.  If you use a CPAP at night, you may bring your mask/headgear for your overnight stay.   You will be asked to remove any contacts, glasses, piercing's, hearing aid's, dentures/partials prior to surgery. Please bring cases for these items if needed.    Patients discharged the day of surgery will not be allowed to drive home, and someone needs to stay with them for 24 hours.  SURGICAL WAITING ROOM VISITATION Patients may have no more than 2 support people in the waiting area - these visitors may rotate.   Pre-op nurse will coordinate an appropriate time for 1 ADULT support person, who may not rotate, to accompany patient in pre-op.  Children under the age of 40 must have an adult with them who is not the patient and must remain in the main waiting area with an adult.  If the patient needs to stay at the hospital during part of their recovery, the visitor guidelines for inpatient rooms apply.  Please refer to the [facility] website for the visitor guidelines for any additional information.   If you received a COVID test during your pre-op visit  it is requested that you wear a mask when out in public, stay away from anyone that may not be feeling well and notify your surgeon if you develop symptoms. If you have been in contact with anyone that has tested positive in the last 10 days please notify you surgeon.    Oral Hygiene is also important  to reduce your risk of infection.  Remember - BRUSH YOUR TEETH THE MORNING OF SURGERY WITH YOUR REGULAR TOOTHPASTE   [facility]- Preparing for Total Shoulder Arthroplasty or Arthroscopy  Before surgery, you can play an important role. Because skin is not sterile, your skin needs to be as free of germs as possible. You can reduce the number of germs on your skin by using the following products.   Benzoyl Peroxide Gel  o Reduces the number of germs present on the skin  o Applied twice a day to shoulder area starting two  days before surgery   Chlorhexidine Gluconate (CHG) Soap (instructions listed above on how to wash with CHG Soap)  o An antiseptic cleaner that kills germs and bonds with the skin to continue killing germs even after washing  o Used for showering the night before surgery and morning of surgery   ==================================================================  Please follow these instructions carefully:  BENZOYL PEROXIDE 5% GEL  Please do not use if you have an allergy to benzoyl peroxide. If your skin becomes reddened/irritated stop using the benzoyl peroxide.  Starting two days before surgery, apply as follows:  1. Apply benzoyl peroxide in the morning and at night. Apply after taking a shower. If you are not taking a shower clean entire shoulder front, back, and side along with the armpit with a clean wet washcloth.  2. Place a quarter-sized dollop on your SHOULDER and rub in thoroughly, making sure to cover the front, back, and side of your shoulder, along with the armpit.   2 Days prior to Surgery First Dose on _____________ Morning Second Dose on ______________ Night  Day Before Surgery First Dose on ______________ Morning Night before surgery wash (entire body except face and private areas) with CHG Soap THEN Second Dose on ____________ Night   Morning of Surgery  wash BODY AGAIN with CHG Soap   4. Do NOT apply benzoyl peroxide gel on the day of surgery       Pre-operative CHG Bathing Instructions   You can play a key role in reducing the risk of infection after surgery. Your skin needs to be as free of germs as possible. You can reduce the number of germs on your skin by washing with CHG (chlorhexidine gluconate) soap before surgery. CHG is an antiseptic soap that kills germs and continues to kill germs even after washing.   DO NOT use if you have an allergy to chlorhexidine/CHG or antibacterial soaps. If your skin becomes reddened or irritated, stop using  the CHG and notify one of our RNs at [phone].              TAKE A SHOWER THE NIGHT BEFORE SURGERY AND THE DAY OF SURGERY    Please keep in mind the following:  DO NOT shave, including legs and underarms, 48 hours prior to surgery.   You may shave your face before/day of surgery.  Place clean sheets on your bed the night before surgery Use a clean washcloth (not used since being washed) for each shower. DO NOT sleep with pet's night before surgery.  CHG Shower Instructions:  Wash your face and private area with normal soap. If you choose to wash your hair, wash first with your normal shampoo.  After you use shampoo/soap, rinse your hair and body thoroughly to remove shampoo/soap residue.  Turn the water OFF and apply half the bottle of CHG soap to a CLEAN washcloth.  Apply CHG soap ONLY FROM YOUR NECK DOWN  TO YOUR TOES (washing for 3-5 minutes)  DO NOT use CHG soap on face, private areas, open wounds, or sores.  Pay special attention to the area where your surgery is being performed.  If you are having back surgery, having someone wash your back for you may be helpful. Wait 2 minutes after CHG soap is applied, then you may rinse off the CHG soap.  Pat dry with a clean towel  Put on clean pajamas    Additional instructions for the day of surgery: DO NOT APPLY any lotions, deodorants, or perfumes.   Do not wear jewelry or makeup Do not wear nail polish, gel polish, artificial nails, or any other type of covering on natural nails (fingers and toes) Do not bring valuables to the hospital. [facility] is not responsible for valuables/personal belongings. Put on clean/comfortable clothes.  Please brush your teeth.  Ask your nurse before applying any prescription medications to the skin.

## 2024-01-16 ENCOUNTER — Encounter: Payer: Self-pay | Admitting: Surgical

## 2024-01-16 ENCOUNTER — Other Ambulatory Visit (INDEPENDENT_AMBULATORY_CARE_PROVIDER_SITE_OTHER)

## 2024-01-16 ENCOUNTER — Encounter (HOSPITAL_COMMUNITY)
Admission: RE | Admit: 2024-01-16 | Discharge: 2024-01-16 | Disposition: A | Discharge status: Home / Self Care | Source: Ambulatory Visit | Attending: Orthopedic Surgery | Admitting: Orthopedic Surgery

## 2024-01-16 ENCOUNTER — Other Ambulatory Visit: Payer: Self-pay

## 2024-01-16 ENCOUNTER — Encounter (HOSPITAL_COMMUNITY): Payer: Self-pay

## 2024-01-16 ENCOUNTER — Ambulatory Visit: Admitting: Surgical

## 2024-01-16 DIAGNOSIS — S82092A Other fracture of left patella, initial encounter for closed fracture: Secondary | ICD-10-CM | POA: Diagnosis not present

## 2024-01-16 DIAGNOSIS — M25562 Pain in left knee: Secondary | ICD-10-CM | POA: Diagnosis not present

## 2024-01-16 DIAGNOSIS — M25561 Pain in right knee: Secondary | ICD-10-CM | POA: Diagnosis not present

## 2024-01-16 DIAGNOSIS — Z01812 Encounter for preprocedural laboratory examination: Secondary | ICD-10-CM | POA: Insufficient documentation

## 2024-01-16 DIAGNOSIS — Z01818 Encounter for other preprocedural examination: Secondary | ICD-10-CM

## 2024-01-16 LAB — BASIC METABOLIC PANEL
Anion gap: 9 (ref 5–15)
BUN: 7 mg/dL (ref 6–20)
CO2: 24 mmol/L (ref 22–32)
Calcium: 8.7 mg/dL — ABNORMAL LOW (ref 8.9–10.3)
Chloride: 103 mmol/L (ref 98–111)
Creatinine, Ser: 0.66 mg/dL (ref 0.44–1.00)
GFR, Estimated: >60 mL/min (ref >60)
Glucose, Bld: 94 mg/dL (ref 70–99)
Potassium: 4.3 mmol/L (ref 3.5–5.1)
Sodium: 136 mmol/L (ref 135–145)

## 2024-01-16 LAB — CBC
HCT: 46.4 % — ABNORMAL HIGH (ref 36.0–46.0)
Hemoglobin: 15.6 g/dL — ABNORMAL HIGH (ref 12.0–15.0)
MCH: 31.8 pg (ref 26.0–34.0)
MCHC: 33.6 g/dL (ref 30.0–36.0)
MCV: 94.7 fL (ref 80.0–100.0)
Platelets: 226 10*3/uL (ref 150–400)
RBC: 4.9 MIL/uL (ref 3.87–5.11)
RDW: 12.4 % (ref 11.5–15.5)
WBC: 6.2 10*3/uL (ref 4.0–10.5)
nRBC: 0 % (ref 0.0–0.2)

## 2024-01-16 MED ORDER — OXYCODONE HCL 5 MG PO TABS: 5.0000 mg | ORAL_TABLET | ORAL | 0 refills | Status: DC | PRN

## 2024-01-16 NOTE — Progress Notes (Signed)
 PCP - Cruz Condon, PA-C Cardiologist - denies  PPM/ICD - denies   Chest x-ray - 11/04/23 EKG - 11/04/23 Stress Test - denies ECHO - denies Cardiac Cath - denies  Sleep Study - denies   DM- denies  Last dose of GLP1 agonist-  n/a   ASA/Blood Thinner Instructions: n/a   ERAS Protcol - clears until 0840 PRE-SURGERY Ensure (pt refused)  COVID TEST- n/a   Anesthesia review: yes, pt was evaluated by Fayrene Fearing in PAT. She said she had just fallen at the gas station on her way here. She said she fell down on both knees and could not bend her left knee or bear weight on her left leg. [name], [place] was notified and agreed to see the pt in the office when she leaves this appt.   Patient denies shortness of breath, fever, cough and chest pain at PAT appointment   All instructions explained to the patient, with a verbal understanding of the material. Patient agrees to go over the instructions while at home for a better understanding. The opportunity to ask questions was provided.

## 2024-01-16 NOTE — Progress Notes (Signed)
 Office Visit Note   Patient: Gina [name]           Date of Birth: [birth year]           MRN: [id number] Visit Date: 01/16/2024 Requested by: Charolett Bumpers, PA-C [address] [place],  [place] [address] PCP: Bradley Ferris  Subjective: Chief Complaint  Patient presents with   Right Knee - Pain   Left Knee - Pain    HPI: Gina [name] is a 48 y.o. female who presents to the office reporting primarily left knee pain.  Patient states that she fell today as she was going to the gas station prior to her preop appointment for her upcoming left shoulder surgery.  She fell and landed directly onto the anterior aspect of her left knee and has not really been able to bear weight ever since this fall.  This happened around 12:30 PM today.  She denies any significant injury to her left knee in the past.  No prior surgery to the left leg.  No groin pain.  Really just has pain centered around the anterior aspect of the left knee but this pain has begun to radiate out down her shin and up to the thigh region.  She is here in wheelchair today..                ROS: All systems reviewed are negative as they relate to the chief complaint within the history of present illness.  Patient denies fevers or chills.  Assessment & Plan: Visit Diagnoses:  1. Closed patellar sleeve fracture of left knee, initial encounter   2. Left knee pain, unspecified chronicity   3. Right knee pain, unspecified chronicity     Plan: Impression is 48 year old female who sustained a fall directly onto her left knee and radiographs demonstrate nondisplaced patellar fracture of the distal pole.  We discussed treatment plan today which mainly includes immobilizing in knee immobilizer.  She is okay to put weight on the injured leg as she can tolerate as long as the knee is straight.  Recommended she keep this straight at all times with no knee flexion in order to avoid distraction at the fracture site.  If  this fracture begins to distract, she understands that we may need to pivot to surgical fixation.  She does not take vitamin D supplement so I recommended she take this.  She does vape and I recommended discontinuing any vaping for now; she states that she will cut back but is unsure if she will be able to entirely limit her vaping.  Will prescribe oxycodone for pain control.  Prescription given for wheelchair and a walker since she is not comfortable with crutches.  Additionally, we will likely have to postpone her shoulder surgery as the postop pain she will likely experience from this shoulder surgery will likely prohibit her from ambulating with a walker.  Patient understands and agrees with plan.  We will see her back in 1 week for clinical recheck with new radiographs at that time to check fracture position.  Follow-Up Instructions: No follow-ups on file.   Orders:  Orders Placed This Encounter  Procedures   XR KNEE 3 VIEW RIGHT   XR KNEE 3 VIEW LEFT   Meds ordered this encounter  Medications   oxyCODONE (ROXICODONE) 5 MG immediate release tablet    Sig: Take 1 tablet (5 mg total) by mouth every 4 (four) hours as needed for severe pain (pain score  7-10).    Dispense:  30 tablet    Refill:  0    S/p patellar fracture      Procedures: No procedures performed   Clinical Data: No additional findings.  Objective: Vital Signs: LMP 01/01/2004   Physical Exam:  Constitutional: Patient appears well-developed HEENT:  Head: Normocephalic Eyes:EOM are normal Neck: Normal range of motion Cardiovascular: Normal rate Pulmonary/chest: Effort normal Neurologic: Patient is alert Skin: Skin is warm Psychiatric: Patient has normal mood and affect  Ortho Exam: Ortho exam demonstrates left knee with superficial abrasion along the anterior aspect of the knee overlying the tibial tubercle without any laceration or penetration below the skin surface.  There is a small area of localized  swelling to the distal pole of the patella which is exquisitely tender quite severely.  She has mild tenderness throughout the medial joint line and really no tenderness through the distal femur.  She does have a little bit of tenderness through the proximal tibia near the area of the abrasion but otherwise no other significant areas of tenderness.  She is not able to perform straight leg raise due to pain.  She can perform hip flexion with no reproducible groin pain.  She does not tolerate any passive internal rotation of her hip due to the severity of her knee pain.  She has intact ankle dorsiflexion and plantarflexion.  Palpable DP pulse of the left lower extremity.  Specialty Comments:  Narrative & Impression CLINICAL DATA:  Low back pain, prior surgery, new symptoms. Ongoing low back pain for 2 months. No known injury. No prior surgery to area.   EXAM: MRI LUMBAR SPINE WITHOUT AND WITH CONTRAST   TECHNIQUE: Multiplanar and multiecho pulse sequences of the lumbar spine were obtained without and with intravenous contrast.   CONTRAST:  10mL GADAVIST GADOBUTROL 1 MMOL/ML IV SOLN   COMPARISON:  Lumbar MRI 04/02/2018. CT of the abdomen and pelvis 06/15/2023. Lumbar spine radiographs 09/18/2018.   FINDINGS: Segmentation: Conventional anatomy assumed, with the last open disc space designated L5-S1.Concordant with prior imaging.   Alignment: Mild convex right scoliosis. No focal angulation or listhesis.   Vertebrae: No worrisome osseous lesion, acute fracture or pars defect. Chronic Schmorl's node in the inferior endplate of L1. No evidence of discitis or osteomyelitis. No obvious postsurgical changes in the lumbar spine.   Conus medullaris: Extends to the L1-2 level and appears normal. No abnormal intradural enhancement.   Paraspinal and other soft tissues: No significant paraspinal findings.   Disc levels:   Sagittal images demonstrate no significant disc space findings within  the visualized lower thoracic spine.   L1-2: Stable loss of disc height with disc bulging and endplate osteophytes. No spinal stenosis or nerve root encroachment.   L2-3: Normal interspace.   L3-4: Normal interspace.   L4-5: Normal interspace.   L5-S1: Normal interspace.   IMPRESSION: 1. No acute findings or explanation for the patient's symptoms. 2. Stable spondylosis at L1-2 without resulting spinal stenosis or nerve root encroachment.     Electronically Signed   By: Carey Bullocks M.D.   On: 07/29/2023 18:36  Imaging: No results found.   PMFS History: Patient Active Problem List   Diagnosis Date Noted   Chronic pain in left shoulder 11/01/2023   Calcific supraspinatus tendonitis 10/03/2023   Bursitis of right shoulder 10/03/2023   Arthritis of right acromioclavicular joint 10/03/2023   S/P arthroscopy of right shoulder 09/27/2023   Persistent headaches 01/18/2023   Blurry vision 01/18/2023   Weight  gain 01/18/2023   SBO (small bowel obstruction) (HCC) 09/13/2022   Nausea and vomiting 09/11/2022   Partial small bowel obstruction (HCC) 09/10/2022   Piriformis syndrome of both sides 05/31/2019   History of bipolar disorder 04/18/2019   Tardive dyskinesia 04/18/2019   Urticaria due to drug allergy 04/18/2019   Lumbar spondylosis 02/05/2019   Bipolar affective disorder, current episode mixed (HCC) 01/02/2019   Arthritis 01/02/2019   Cigarette nicotine dependence without complication 01/02/2019   Bilateral temporomandibular joint pain 10/02/2018   Referred otalgia, bilateral 10/02/2018   Paresthesias 03/09/2018   Chronic migraine without aura, intractable, with status migrainosus 04/22/2016   Bipolar 1 disorder, mixed, moderate (HCC) 07/15/2015    Class: Chronic   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   Sciatica 01/09/2015   Reactive hypoglycemia 12/17/2013   Musculoskeletal malfunction arising from mental factors 04/04/2013   Incisional hernia  11/15/2012   Other postprocedural status(V45.89) 11/15/2012   S/P hernia surgery 11/15/2012   Abdominal pain 11/11/2012   Chronic pain syndrome 11/11/2012   Disorder of sacrum 11/11/2012   Chronic pain 12/21/2011   Heartburn 10/04/2011   DYSPHAGIA 10/04/2011   Depression with anxiety 09/15/2010   CONSTIPATION 09/15/2010   LUNG NODULE 06/17/2010   Herpes simplex virus (HSV) infection 06/11/2010   FATIGUE 06/11/2010   STRICTURE AND STENOSIS OF ESOPHAGUS 05/13/2010   ABDOMINAL WALL HERNIA 02/27/2010   ENDOMETRIOSIS 12/16/2009   GERD 10/23/2009   PALPITATIONS 10/23/2009   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   DYSTHYMIC DISORDER 06/20/2009   CONDYLOMA ACUMINATUM 04/23/2009   LENTIGO 04/23/2009   DENTAL PAIN 03/19/2009   CERVICAL RADICULOPATHY 12/11/2008   Cervical radiculopathy 12/11/2008   PANIC DISORDER WITHOUT AGORAPHOBIA 08/07/2008   TOBACCO ABUSE 08/07/2008   BACK PAIN, THORACIC REGION 07/07/2007   ALLERGIC RHINITIS 06/02/2007   Past Medical History:  Diagnosis Date   ABDOMINAL WALL HERNIA 02/27/2010   Abnormal Pap smear    ALLERGIC RHINITIS 06/02/2007   ANOREXIA, CHRONIC 08/07/2008   ANXIETY 09/15/2010   Arthritis    BACK PAIN, THORACIC REGION 07/07/2007   Bipolar disorder (HCC)    CERVICAL RADICULOPATHY 12/11/2008   Complication of anesthesia    Pateint reprots a high tolerance   Complication of anesthesia    pt states she has not had her voice completely back since surgery in Nov. 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   Eating disorder    ENDOMETRIOSIS 12/16/2009   FATIGUE 06/11/2010   Fibromyalgia    GERD 10/23/2009   Heart palpitations    HERPES SIMPLEX INFECTION 06/11/2010   LENTIGO 04/23/2009   LUNG NODULE 06/17/2010   Narcotic abuse, continuous (HCC)    pt denies   Ovarian cyst    Palpitations 10/23/2009   Panic disorder    Rheumatoid  arthritis(714.0)    Stricture and stenosis of esophagus 05/13/2010   TOBACCO ABUSE 08/07/2008   Urinary tract infection    UTI (urinary tract infection)     Family History  Problem Relation Age of Onset   Depression Mother    Arthritis Mother    Hyperlipidemia Father    Hypertension Father     Past Surgical History:  Procedure Laterality Date   ABDOMINAL SURGERY     ABDOMINAL WALL MESH  REMOVAL     ABLATION ON ENDOMETRIOSIS     CESAREAN SECTION     x2    CYSTOSCOPY N/A 11/22/2023   Procedure: CYSTOSCOPY;  Surgeon: Noel Christmas, MD;  Location: [facility];  Service: Urology;  Laterality: N/A;   DILATION AND CURETTAGE OF UTERUS     x1    ENDOMETRIAL ABLATION     esophageal dilatation x 4     GUM SURGERY     HERNIA REPAIR     X3   PARTIAL HYSTERECTOMY     SHOULDER SURGERY Right 09/2023   Social History   Occupational History   Not on file  Tobacco Use   Smoking status: Every Day    Current packs/day: 0.20    Types: Cigarettes   Smokeless tobacco: Never  Vaping Use   Vaping status: Every Day  Substance and Sexual Activity   Alcohol use: No   Drug use: No   Sexual activity: Yes    Birth control/protection: Surgical

## 2024-01-16 NOTE — Pre-Procedure Instructions (Signed)
 Surgical Instructions   Your procedure is scheduled on Tuesday, March 25, 25. Report to [facility] Main Entrance "A" at 9:40 A.M., then check in with the Admitting office. Any questions or running late day of surgery: call [phone]  Questions prior to your surgery date: call [phone], Monday-Friday, 8am-4pm. If you experience any cold or flu symptoms such as cough, fever, chills, shortness of breath, etc. between now and your scheduled surgery, please notify us at the above number.     Remember:  Do not eat after midnight the night before your surgery   You may drink clear liquids until 8:40 the morning of your surgery.   Clear liquids allowed are: Water, Non-Citrus Juices (without pulp), Carbonated Beverages, Clear Tea (no milk, honey, etc.), Black Coffee Only (NO MILK, CREAM OR POWDERED CREAMER of any kind), and Gatorade.    Patient Instructions  The night before surgery:  No food after midnight. ONLY clear liquids after midnight  The day of surgery:  Drink ONE (1) Pre-Surgery Clear Ensure by 8:40 the morning of surgery. Drink in one sitting. Do not sip.  This drink was given to you during your hospital pre-op appointment visit.  Nothing else to drink after completing the Pre-Surgery Clear Ensure.        If you have questions, please contact your surgeon's office.    Take these medicines the morning of surgery with A SIP OF WATER  methocarbamol (ROBAXIN)  omeprazole (PRILOSEC)    May take these medicines IF NEEDED: albuterol (VENTOLIN HFA) inhaler - please bring with you on day of surgery  LORazepam (ATIVAN)  HYDROcodone-acetaminophen (NORCO/VICODIN)  promethazine (PHENERGAN)    One week prior to surgery, STOP taking any Aspirin (unless otherwise instructed by your surgeon) Aleve, Naproxen, Ibuprofen, Motrin, Advil, Goody's, BC's, all herbal medications, fish oil, and non-prescription vitamins.                     Do NOT Smoke (Tobacco/Vaping) for 24 hours  prior to your procedure.  If you use a CPAP at night, you may bring your mask/headgear for your overnight stay.   You will be asked to remove any contacts, glasses, piercing's, hearing aid's, dentures/partials prior to surgery. Please bring cases for these items if needed.    Patients discharged the day of surgery will not be allowed to drive home, and someone needs to stay with them for 24 hours.  SURGICAL WAITING ROOM VISITATION Patients may have no more than 2 support people in the waiting area - these visitors may rotate.   Pre-op nurse will coordinate an appropriate time for 1 ADULT support person, who may not rotate, to accompany patient in pre-op.  Children under the age of 44 must have an adult with them who is not the patient and must remain in the main waiting area with an adult.  If the patient needs to stay at the hospital during part of their recovery, the visitor guidelines for inpatient rooms apply.  Please refer to the [facility] website for the visitor guidelines for any additional information.   If you received a COVID test during your pre-op visit  it is requested that you wear a mask when out in public, stay away from anyone that may not be feeling well and notify your surgeon if you develop symptoms. If you have been in contact with anyone that has tested positive in the last 10 days please notify you surgeon.    Oral Hygiene is also important  to reduce your risk of infection.  Remember - BRUSH YOUR TEETH THE MORNING OF SURGERY WITH YOUR REGULAR TOOTHPASTE   [place]- Preparing for Total Shoulder Arthroplasty or Arthroscopy  Before surgery, you can play an important role. Because skin is not sterile, your skin needs to be as free of germs as possible. You can reduce the number of germs on your skin by using the following products.   Benzoyl Peroxide Gel  o Reduces the number of germs present on the skin  o Applied twice a day to shoulder area starting two  days before surgery  DIAL SOAP  ==================================================================  Please follow these instructions carefully:  BENZOYL PEROXIDE 5% GEL  Please do not use if you have an allergy to benzoyl peroxide. If your skin becomes reddened/irritated stop using the benzoyl peroxide.  Starting two days before surgery, apply as follows:  1. Apply benzoyl peroxide in the morning and at night. Apply after taking a shower. If you are not taking a shower clean entire shoulder front, back, and side along with the armpit with a clean wet washcloth.  2. Place a quarter-sized dollop on your SHOULDER and rub in thoroughly, making sure to cover the front, back, and side of your shoulder, along with the armpit.   2 Days prior to Surgery First Dose on _____________ Morning Second Dose on ______________ Night  Day Before Surgery First Dose on ______________ Morning Night before surgery wash (entire body except face and private areas) with DIAL Soap THEN Second Dose on ____________ Night   Morning of Surgery  wash BODY AGAIN with DIAL Soap   4. Do NOT apply benzoyl peroxide gel on the day of surgery       Pre-operative Bathing Instructions   You can play a key role in reducing the risk of infection after surgery. Your skin needs to be as free of germs as possible. You can reduce the number of germs on your skin by washing with DIAL soap before surgery.    Please follow these instructions carefully.   Shower the NIGHT BEFORE SURGERY and the MORNING OF SURGERY with DIAL Soap.   Pat yourself dry with a CLEAN TOWEL.  Wear CLEAN PAJAMAS to bed the night before surgery  Place CLEAN SHEETS on your bed the night of your first shower and DO NOT SLEEP WITH PETS.   Additional instructions for the day of surgery: DO NOT APPLY any lotions, deodorants, cologne, or perfumes.   Do not wear jewelry or makeup Do not wear nail polish, gel polish, artificial nails, or any  other type of covering on natural nails (fingers and toes) Do not bring valuables to the hospital. [facility] is not responsible for valuables/personal belongings. Put on clean/comfortable clothes.  Please brush your teeth.  Ask your nurse before applying any prescription medications to the skin.

## 2024-01-17 ENCOUNTER — Encounter (HOSPITAL_COMMUNITY): Payer: Self-pay | Admitting: Physician Assistant

## 2024-01-17 ENCOUNTER — Telehealth: Payer: Self-pay | Admitting: Surgical

## 2024-01-17 NOTE — Telephone Encounter (Signed)
 Sent mychart msg.

## 2024-01-17 NOTE — Telephone Encounter (Signed)
 Patient called. Says her pain is terrible. She had a hard time sleeping. Would like Tylenol with oxycodone. Please call [phone]

## 2024-01-19 ENCOUNTER — Telehealth: Payer: Self-pay | Admitting: Orthopedic Surgery

## 2024-01-19 ENCOUNTER — Other Ambulatory Visit: Payer: Self-pay | Admitting: Surgical

## 2024-01-19 ENCOUNTER — Telehealth: Payer: Self-pay | Admitting: Surgical

## 2024-01-19 MED ORDER — OXYCODONE-ACETAMINOPHEN 10-325 MG PO TABS: ORAL_TABLET | ORAL | 0 refills | Status: DC

## 2024-01-19 MED ORDER — DOCUSATE SODIUM 100 MG PO CAPS: 100.0000 mg | ORAL_CAPSULE | Freq: Two times a day (BID) | ORAL | 2 refills | Status: DC

## 2024-01-19 MED ORDER — POLYETHYLENE GLYCOL 3350 17 G PO PACK: 17.0000 g | PACK | Freq: Every day | ORAL | 0 refills | Status: DC

## 2024-01-19 NOTE — Telephone Encounter (Signed)
 Pt states please meds to [facility]. They close alittle than pt other pharmacy. Please call pt when went in. She state she had one tablet left. Pt phone number is [phone].

## 2024-01-19 NOTE — Telephone Encounter (Signed)
 Pt called requesting refill of pain medication and something for constipation. Please send to pharmacy on file. Pt states she is sleeping 4 hrs and brace is cutting into her leg. Please call pt at [phone].

## 2024-01-19 NOTE — Telephone Encounter (Signed)
 Sent in RX for colace and miralax

## 2024-01-23 ENCOUNTER — Other Ambulatory Visit (INDEPENDENT_AMBULATORY_CARE_PROVIDER_SITE_OTHER)

## 2024-01-23 ENCOUNTER — Ambulatory Visit: Admitting: Surgical

## 2024-01-23 DIAGNOSIS — M25572 Pain in left ankle and joints of left foot: Secondary | ICD-10-CM

## 2024-01-23 DIAGNOSIS — M79672 Pain in left foot: Secondary | ICD-10-CM

## 2024-01-23 DIAGNOSIS — M25512 Pain in left shoulder: Secondary | ICD-10-CM | POA: Diagnosis not present

## 2024-01-23 DIAGNOSIS — S82092A Other fracture of left patella, initial encounter for closed fracture: Secondary | ICD-10-CM

## 2024-01-24 ENCOUNTER — Other Ambulatory Visit: Payer: Self-pay | Admitting: Obstetrics and Gynecology

## 2024-01-24 ENCOUNTER — Encounter (HOSPITAL_COMMUNITY): Admission: RE | Payer: Self-pay | Source: Home / Self Care

## 2024-01-24 ENCOUNTER — Ambulatory Visit (HOSPITAL_COMMUNITY): Admission: RE | Admit: 2024-01-24 | Payer: Medicare Other | Source: Home / Self Care | Admitting: Orthopedic Surgery

## 2024-01-24 DIAGNOSIS — N644 Mastodynia: Secondary | ICD-10-CM

## 2024-01-24 DIAGNOSIS — Z01818 Encounter for other preprocedural examination: Secondary | ICD-10-CM

## 2024-01-24 SURGERY — SHOULDER ARTHROSCOPY WITH DISTAL CLAVICLE RESECTION
Anesthesia: General | Site: Shoulder | Laterality: Left

## 2024-01-25 ENCOUNTER — Telehealth: Payer: Self-pay | Admitting: Orthopedic Surgery

## 2024-01-25 ENCOUNTER — Encounter: Payer: Self-pay | Admitting: Surgical

## 2024-01-25 ENCOUNTER — Other Ambulatory Visit: Payer: Self-pay | Admitting: Surgical

## 2024-01-25 MED ORDER — OXYCODONE-ACETAMINOPHEN 10-325 MG PO TABS: 1.0000 | ORAL_TABLET | Freq: Four times a day (QID) | ORAL | 0 refills | Status: DC | PRN

## 2024-01-25 NOTE — Progress Notes (Signed)
 Post-fracture visit Note   Patient: Gina [name]           Date of Birth: 03-08-1976           MRN: [id number] Visit Date: 01/23/2024 PCP: Charolett Bumpers, PA-C   Assessment & Plan:  Chief Complaint:  Chief Complaint  Patient presents with   Left Knee - Fracture, Follow-up    DOI 01/16/2024   Visit Diagnoses:  1. Closed patellar sleeve fracture of left knee, initial encounter   2. Left shoulder pain, unspecified chronicity   3. Pain in left ankle and joints of left foot   4. Pain in left foot     Plan: Patient returns following left inferior pole patellar fracture sustained on 01/16/2024.  She has a knee immobilizer.  Still reports a lot of pain.  Mostly localizes pain to the front of the knee.  She has been able to put some weight on her injured leg with the use of a walker.  While using a walker, she has had some popping in her left shoulder and the left shoulder is a lot more painful now.  Taking oxycodone for pain control.  Also complains of some left ankle pain with swelling that came on about 3 to 4 days after her initial injury.  On exam, left knee without any obvious deformity with small abrasion over the anterior proximal tibia that looks to be healing well.  There is reduced soft tissue swelling over the patella with less point tenderness over the inferior pole of the patella compared with last visit.  She is able to tolerate extension and flexion of the left knee much better.  No calf tenderness.  Negative Homans' sign.  She has palpable DP pulse of the left lower extremity.  There is mild tenderness over the anterior ankle joint line but really no tenderness over the medial or lateral malleoli with no crepitus, bruising, swelling noted.  She has intact ankle dorsiflexion, plantarflexion, inversion, eversion.  Impression is left knee that seems to be progressing appropriately following patellar fracture.  Radiographs demonstrate no change in fracture alignment so plan  to continue with keeping leg in full extension.  See her back in 2 weeks for clinical recheck with new radiographs at that time and advancement of range of motion.  Did provide her with a prescription for a Bledsoe brace today since she is having a lot of pain from the metal bar in the back of the knee immobilizer and hopefully this will be more comfortable for her.  Follow-Up Instructions: Return in about 1 week (around 01/30/2024).   Orders:  Orders Placed This Encounter  Procedures   XR Knee 1-2 Views Left   XR Foot Complete Left   XR Ankle Complete Left   XR Shoulder Left   No orders of the defined types were placed in this encounter.   Imaging: No results found.  PMFS History: Patient Active Problem List   Diagnosis Date Noted   Chronic pain in left shoulder 11/01/2023   Calcific supraspinatus tendonitis 10/03/2023   Bursitis of right shoulder 10/03/2023   Arthritis of right acromioclavicular joint 10/03/2023   S/P arthroscopy of right shoulder 09/27/2023   Persistent headaches 01/18/2023   Blurry vision 01/18/2023   Weight gain 01/18/2023   SBO (small bowel obstruction) (HCC) 09/13/2022   Nausea and vomiting 09/11/2022   Partial small bowel obstruction (HCC) 09/10/2022   Piriformis syndrome of both sides 05/31/2019   History of bipolar disorder 04/18/2019  Tardive dyskinesia 04/18/2019   Urticaria due to drug allergy 04/18/2019   Lumbar spondylosis 02/05/2019   Bipolar affective disorder, current episode mixed (HCC) 01/02/2019   Arthritis 01/02/2019   Cigarette nicotine dependence without complication 01/02/2019   Bilateral temporomandibular joint pain 10/02/2018   Referred otalgia, bilateral 10/02/2018   Paresthesias 03/09/2018   Chronic migraine without aura, intractable, with status migrainosus 04/22/2016   Bipolar 1 disorder, mixed, moderate (HCC) 07/15/2015    Class: Chronic   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   Sciatica 01/09/2015    Reactive hypoglycemia 12/17/2013   Musculoskeletal malfunction arising from mental factors 04/04/2013   Incisional hernia 11/15/2012   Other postprocedural status(V45.89) 11/15/2012   S/P hernia surgery 11/15/2012   Abdominal pain 11/11/2012   Chronic pain syndrome 11/11/2012   Disorder of sacrum 11/11/2012   Chronic pain 12/21/2011   Heartburn 10/04/2011   DYSPHAGIA 10/04/2011   Depression with anxiety 09/15/2010   CONSTIPATION 09/15/2010   LUNG NODULE 06/17/2010   Herpes simplex virus (HSV) infection 06/11/2010   FATIGUE 06/11/2010   STRICTURE AND STENOSIS OF ESOPHAGUS 05/13/2010   ABDOMINAL WALL HERNIA 02/27/2010   ENDOMETRIOSIS 12/16/2009   GERD 10/23/2009   PALPITATIONS 10/23/2009   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   DYSTHYMIC DISORDER 06/20/2009   CONDYLOMA ACUMINATUM 04/23/2009   LENTIGO 04/23/2009   DENTAL PAIN 03/19/2009   CERVICAL RADICULOPATHY 12/11/2008   Cervical radiculopathy 12/11/2008   PANIC DISORDER WITHOUT AGORAPHOBIA 08/07/2008   TOBACCO ABUSE 08/07/2008   BACK PAIN, THORACIC REGION 07/07/2007   ALLERGIC RHINITIS 06/02/2007   Past Medical History:  Diagnosis Date   ABDOMINAL WALL HERNIA 02/27/2010   Abnormal Pap smear    ALLERGIC RHINITIS 06/02/2007   ANOREXIA, CHRONIC 08/07/2008   ANXIETY 09/15/2010   Arthritis    BACK PAIN, THORACIC REGION 07/07/2007   Bipolar disorder (HCC)    CERVICAL RADICULOPATHY 12/11/2008   Complication of anesthesia    Pateint reprots a high tolerance   Complication of anesthesia    pt states she has not had her voice completely back since surgery in Nov. 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   Eating disorder    ENDOMETRIOSIS 12/16/2009   FATIGUE 06/11/2010   Fibromyalgia    GERD 10/23/2009   Heart palpitations    HERPES SIMPLEX INFECTION 06/11/2010   LENTIGO 04/23/2009   LUNG NODULE 06/17/2010   Narcotic  abuse, continuous (HCC)    pt denies   Ovarian cyst    Palpitations 10/23/2009   Panic disorder    Rheumatoid arthritis(714.0)    Stricture and stenosis of esophagus 05/13/2010   TOBACCO ABUSE 08/07/2008   Urinary tract infection    UTI (urinary tract infection)     Family History  Problem Relation Age of Onset   Depression Mother    Arthritis Mother    Hyperlipidemia Father    Hypertension Father     Past Surgical History:  Procedure Laterality Date   ABDOMINAL SURGERY     ABDOMINAL WALL MESH  REMOVAL     ABLATION ON ENDOMETRIOSIS     CESAREAN SECTION     x2    CYSTOSCOPY N/A 11/22/2023   Procedure: CYSTOSCOPY;  Surgeon: Noel Christmas, MD;  Location: [facility];  Service: Urology;  Laterality: N/A;   DILATION AND CURETTAGE OF UTERUS     x1    ENDOMETRIAL ABLATION     esophageal dilatation x 4  GUM SURGERY     HERNIA REPAIR     X3   PARTIAL HYSTERECTOMY     SHOULDER SURGERY Right 09/2023   Social History   Occupational History   Not on file  Tobacco Use   Smoking status: Every Day    Current packs/day: 0.20    Types: Cigarettes   Smokeless tobacco: Never  Vaping Use   Vaping status: Every Day  Substance and Sexual Activity   Alcohol use: No   Drug use: No   Sexual activity: Yes    Birth control/protection: Surgical

## 2024-01-25 NOTE — Telephone Encounter (Signed)
Sent in this morning

## 2024-01-25 NOTE — Telephone Encounter (Signed)
 Pt called requesting refill of pain medication be sent to [facility] on [address]. Pt is asking for a call when medication has been sent in so she can pick it up. Pt states she will be out of her medication in 1 hour. Pt phone number is [phone].

## 2024-01-31 ENCOUNTER — Other Ambulatory Visit: Payer: Self-pay | Admitting: Surgical

## 2024-01-31 ENCOUNTER — Telehealth: Payer: Self-pay | Admitting: Surgical

## 2024-01-31 MED ORDER — OXYCODONE-ACETAMINOPHEN 5-325 MG PO TABS: 1.0000 | ORAL_TABLET | Freq: Four times a day (QID) | ORAL | 0 refills | Status: DC | PRN

## 2024-01-31 NOTE — Telephone Encounter (Signed)
 If the bledsoe brace is not working, I would just go back to using knee immobilizer and wrap her leg with ace-bandage to prevent the bar from rubbing her skin like it has in the past. I refilled medication at a lower dosage to work toward weaning off

## 2024-01-31 NOTE — Telephone Encounter (Signed)
 Patient says the brace kept falling down, her knee is swollen. She will be out of medication tomorrow. Her call back #[phone]

## 2024-02-01 ENCOUNTER — Encounter: Payer: Medicare Other | Admitting: Orthopedic Surgery

## 2024-02-06 ENCOUNTER — Other Ambulatory Visit (INDEPENDENT_AMBULATORY_CARE_PROVIDER_SITE_OTHER): Payer: Self-pay

## 2024-02-06 ENCOUNTER — Ambulatory Visit: Admitting: Surgical

## 2024-02-06 ENCOUNTER — Encounter: Payer: Self-pay | Admitting: Surgical

## 2024-02-06 DIAGNOSIS — S82092A Other fracture of left patella, initial encounter for closed fracture: Secondary | ICD-10-CM

## 2024-02-06 NOTE — Progress Notes (Signed)
 Post-fracture Visit Note   Patient: Gina [name]           Date of Birth: Feb 20, 1976           MRN: [id number] Visit Date: 02/06/2024 PCP: Charolett Bumpers, PA-C   Assessment & Plan:  Chief Complaint:  Chief Complaint  Patient presents with   Left Knee - Follow-up, Fracture    DOI 01/16/2024   Visit Diagnoses:  1. Closed patellar sleeve fracture of left knee, initial encounter     Plan: Patient is a 48 year old female who presents s/p left knee patellar fracture on 01/16/2024.  Doing better day by day.  Last week has been particularly good and she is more ambulatory.  Not using a walker any longer.  Uses wheelchair about 20% of the time.  Taking oxycodone about every 6 hours for pain.  Slept in her bed last night.  Is having some radicular left leg pain at times.  Also having some radicular left arm pain with associated scapular, neck pain, numbness and tingling of her left hand.  This is new and causing her increased discomfort along with the [facility] joint arthritis that she has chronically.  On exam, patient has point tenderness over the inferior pole of the patella with less localized swelling.  No effusion.  Able to perform straight leg raise.  She has intact ankle dorsiflexion and plantarflexion.  Palpable PT pulse.  Plan is to continue with the leg straight for 1 week.  Start range of motion from 0 to 30 degrees starting next Monday and then she will follow-up in 2 weeks from today so we can get new x-rays of her knee to ensure there is no distraction at the fracture site from the knee range of motion.  We will also consider cervical spine radiographs at that time if she is having increased radicular pain.  We did give her intramuscular steroid injection today to see if this will help her radicular pain.  Follow-Up Instructions: No follow-ups on file.   Orders:  Orders Placed This Encounter  Procedures   XR Knee 1-2 Views Left   No orders of the defined types were placed in  this encounter.   Imaging: No results found.  PMFS History: Patient Active Problem List   Diagnosis Date Noted   Chronic pain in left shoulder 11/01/2023   Calcific supraspinatus tendonitis 10/03/2023   Bursitis of right shoulder 10/03/2023   Arthritis of right acromioclavicular joint 10/03/2023   S/P arthroscopy of right shoulder 09/27/2023   Persistent headaches 01/18/2023   Blurry vision 01/18/2023   Weight gain 01/18/2023   SBO (small bowel obstruction) (HCC) 09/13/2022   Nausea and vomiting 09/11/2022   Partial small bowel obstruction (HCC) 09/10/2022   Piriformis syndrome of both sides 05/31/2019   History of bipolar disorder 04/18/2019   Tardive dyskinesia 04/18/2019   Urticaria due to drug allergy 04/18/2019   Lumbar spondylosis 02/05/2019   Bipolar affective disorder, current episode mixed (HCC) 01/02/2019   Arthritis 01/02/2019   Cigarette nicotine dependence without complication 01/02/2019   Bilateral temporomandibular joint pain 10/02/2018   Referred otalgia, bilateral 10/02/2018   Paresthesias 03/09/2018   Chronic migraine without aura, intractable, with status migrainosus 04/22/2016   Bipolar 1 disorder, mixed, moderate (HCC) 07/15/2015    Class: Chronic   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   Sciatica 01/09/2015   Reactive hypoglycemia 12/17/2013   Musculoskeletal malfunction arising from mental factors 04/04/2013   Incisional hernia 11/15/2012  Other postprocedural status(V45.89) 11/15/2012   S/P hernia surgery 11/15/2012   Abdominal pain 11/11/2012   Chronic pain syndrome 11/11/2012   Disorder of sacrum 11/11/2012   Chronic pain 12/21/2011   Heartburn 10/04/2011   DYSPHAGIA 10/04/2011   Depression with anxiety 09/15/2010   CONSTIPATION 09/15/2010   LUNG NODULE 06/17/2010   Herpes simplex virus (HSV) infection 06/11/2010   FATIGUE 06/11/2010   STRICTURE AND STENOSIS OF ESOPHAGUS 05/13/2010   ABDOMINAL WALL HERNIA 02/27/2010    ENDOMETRIOSIS 12/16/2009   GERD 10/23/2009   PALPITATIONS 10/23/2009   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   DYSTHYMIC DISORDER 06/20/2009   CONDYLOMA ACUMINATUM 04/23/2009   LENTIGO 04/23/2009   DENTAL PAIN 03/19/2009   CERVICAL RADICULOPATHY 12/11/2008   Cervical radiculopathy 12/11/2008   PANIC DISORDER WITHOUT AGORAPHOBIA 08/07/2008   TOBACCO ABUSE 08/07/2008   BACK PAIN, THORACIC REGION 07/07/2007   ALLERGIC RHINITIS 06/02/2007   Past Medical History:  Diagnosis Date   ABDOMINAL WALL HERNIA 02/27/2010   Abnormal Pap smear    ALLERGIC RHINITIS 06/02/2007   ANOREXIA, CHRONIC 08/07/2008   ANXIETY 09/15/2010   Arthritis    BACK PAIN, THORACIC REGION 07/07/2007   Bipolar disorder (HCC)    CERVICAL RADICULOPATHY 12/11/2008   Complication of anesthesia    Pateint reprots a high tolerance   Complication of anesthesia    pt states she has not had her voice completely back since surgery in Nov. 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   Eating disorder    ENDOMETRIOSIS 12/16/2009   FATIGUE 06/11/2010   Fibromyalgia    GERD 10/23/2009   Heart palpitations    HERPES SIMPLEX INFECTION 06/11/2010   LENTIGO 04/23/2009   LUNG NODULE 06/17/2010   Narcotic abuse, continuous (HCC)    pt denies   Ovarian cyst    Palpitations 10/23/2009   Panic disorder    Rheumatoid arthritis(714.0)    Stricture and stenosis of esophagus 05/13/2010   TOBACCO ABUSE 08/07/2008   Urinary tract infection    UTI (urinary tract infection)     Family History  Problem Relation Age of Onset   Depression Mother    Arthritis Mother    Hyperlipidemia Father    Hypertension Father     Past Surgical History:  Procedure Laterality Date   ABDOMINAL SURGERY     ABDOMINAL WALL MESH  REMOVAL     ABLATION ON ENDOMETRIOSIS     CESAREAN SECTION     x2    CYSTOSCOPY N/A 11/22/2023   Procedure: CYSTOSCOPY;   Surgeon: Noel Christmas, MD;  Location: [facility];  Service: Urology;  Laterality: N/A;   DILATION AND CURETTAGE OF UTERUS     x1    ENDOMETRIAL ABLATION     esophageal dilatation x 4     GUM SURGERY     HERNIA REPAIR     X3   PARTIAL HYSTERECTOMY     SHOULDER SURGERY Right 09/2023   Social History   Occupational History   Not on file  Tobacco Use   Smoking status: Every Day    Current packs/day: 0.20    Types: Cigarettes   Smokeless tobacco: Never  Vaping Use   Vaping status: Every Day  Substance and Sexual Activity   Alcohol use: No   Drug use: No   Sexual activity: Yes    Birth control/protection: Surgical

## 2024-02-09 ENCOUNTER — Other Ambulatory Visit

## 2024-02-09 ENCOUNTER — Other Ambulatory Visit: Payer: Self-pay | Admitting: Surgical

## 2024-02-09 ENCOUNTER — Encounter

## 2024-02-09 MED ORDER — OXYCODONE-ACETAMINOPHEN 5-325 MG PO TABS: 1.0000 | ORAL_TABLET | Freq: Three times a day (TID) | ORAL | 0 refills | Status: DC | PRN

## 2024-02-14 ENCOUNTER — Emergency Department (HOSPITAL_BASED_OUTPATIENT_CLINIC_OR_DEPARTMENT_OTHER): Admitting: Radiology

## 2024-02-14 ENCOUNTER — Emergency Department (HOSPITAL_BASED_OUTPATIENT_CLINIC_OR_DEPARTMENT_OTHER)
Admission: EM | Admit: 2024-02-14 | Discharge: 2024-02-15 | Disposition: A | Discharge status: Home / Self Care | Attending: Emergency Medicine | Admitting: Emergency Medicine

## 2024-02-14 ENCOUNTER — Other Ambulatory Visit: Payer: Self-pay

## 2024-02-14 ENCOUNTER — Encounter (HOSPITAL_BASED_OUTPATIENT_CLINIC_OR_DEPARTMENT_OTHER): Payer: Self-pay | Admitting: Emergency Medicine

## 2024-02-14 DIAGNOSIS — Z8781 Personal history of (healed) traumatic fracture: Secondary | ICD-10-CM

## 2024-02-14 DIAGNOSIS — M25562 Pain in left knee: Secondary | ICD-10-CM | POA: Diagnosis present

## 2024-02-14 NOTE — ED Triage Notes (Signed)
 Left knee pain + Fx last month When getting out of recliner Increased pain today, "my knee popped"

## 2024-02-15 NOTE — ED Provider Notes (Signed)
 [place] EMERGENCY DEPARTMENT AT [facility] Provider Note   CSN: [id number] Arrival date & time: 02/14/24  2017     History  Chief Complaint  Patient presents with   Knee Pain    Gina [name] is a 48 y.o. female.  Patient is a 48 year old female presenting with complaints of left knee pain.  She fractured her patella 1 month ago and has been wearing a knee immobilizer since.  Today while she was getting around in her immobilizer, she felt a crack followed by significant discomfort in her left knee.       Home Medications Prior to Admission medications   Medication Sig Start Date End Date Taking? Authorizing Provider  docusate sodium (COLACE) 100 MG capsule Take 1 capsule (100 mg total) by mouth 2 (two) times daily. 01/19/24 01/18/25  Magnant, Charles L, PA-C  polyethylene glycol (MIRALAX) 17 g packet Take 17 g by mouth daily. 01/19/24   Magnant, Charles L, PA-C  albuterol (VENTOLIN HFA) 108 (90 Base) MCG/ACT inhaler Inhale 2 puffs into the lungs every 6 (six) hours as needed for wheezing or shortness of breath. 11/04/23 05/02/24  [provider]  calcium carbonate (TUMS - DOSED IN MG ELEMENTAL CALCIUM) 500 MG chewable tablet Chew 1 tablet by mouth 4 (four) times daily as needed for indigestion or heartburn.    [provider]  ELESTRIN 0.52 MG/0.87 GM (0.06%) GEL Apply 1 Application topically 3 (three) times a week. 10/03/23   [provider]  escitalopram (LEXAPRO) 20 MG tablet Take 30 mg by mouth at bedtime. 12/20/18   [provider]  loperamide (IMODIUM) 2 MG capsule Take 2-4 mg by mouth as needed for diarrhea or loose stools.    [provider]  LORazepam (ATIVAN) 1 MG tablet Take 1 mg by mouth every 8 (eight) hours as needed for anxiety.    [provider]  Lurasidone HCl 60 MG TABS Take 60 mg by mouth every evening. 12/16/22   [provider]  methocarbamol (ROBAXIN) 500 MG tablet Take 1 tablet (500 mg  total) by mouth 2 (two) times daily. Patient taking differently: Take 500 mg by mouth daily. 12/24/23   Judithann Sheen, PA  omeprazole (PRILOSEC) 40 MG capsule Take 40 mg by mouth in the morning.    [provider]  oxyCODONE-acetaminophen (PERCOCET) 5-325 MG tablet Take 1 tablet by mouth every 8 (eight) hours as needed for severe pain (pain score 7-10). 02/09/24 02/08/25  Magnant, Joycie Peek, PA-C  promethazine (PHENERGAN) 12.5 MG tablet Take 12.5 mg by mouth 3 (three) times daily as needed for nausea or vomiting.    [provider]  Vibegron (GEMTESA) 75 MG TABS Take 75 mg by mouth daily in the afternoon.    [provider]      Allergies    Lamictal [lamotrigine], Morphine and codeine, Nicotine, Rizatriptan, Zofran, Chantix [varenicline tartrate], Ciprofloxacin, Codeine, Haldol [haloperidol], Ibuprofen, Lidoderm [lidocaine], Metaxalone, Sulfa antibiotics, Sulfasalazine, Toradol [ketorolac tromethamine], Doxycycline, Duloxetine, Duloxetine hcl, Gabapentin, Ingrezza [valbenazine tosylate], Methadone, Other, Prednisone, Pregabalin, Zolpidem, Zolpidem tartrate, Amoxicillin, Aripiprazole, Chlorhexidine, Penicillins, Tramadol, and Valbenazine    Review of Systems   Review of Systems  All other systems reviewed and are negative.   Physical Exam Updated Vital Signs BP 99/83 (BP Location: Left Arm)   Pulse 68   Temp 97.8 F (36.6 C) (Oral)   Resp 20   LMP 01/01/2004   SpO2 99%  Physical Exam Vitals and nursing note reviewed.  Constitutional:  Appearance: Normal appearance.  Pulmonary:     Effort: Pulmonary effort is normal.  Musculoskeletal:     Comments: The left knee appears grossly normal.  There is tenderness to palpation overlying the patella, but no deformity.  No further exam attempted due to fracture history.  Skin:    General: Skin is warm and dry.  Neurological:     Mental Status: She is alert.     ED Results / Procedures / Treatments    Labs (all labs ordered are listed, but only abnormal results are displayed) Labs Reviewed - No data to display  EKG None  Radiology DG Knee Complete 4 Views Left Result Date: 02/14/2024 CLINICAL DATA:  injury pain EXAM: LEFT KNEE - COMPLETE 4+ VIEW COMPARISON:  February 06, 2024, January 23, 2024 FINDINGS: Similar transverse fracture line through the inferior pole of the patella. No displacement. No evidence of fracture, dislocation, or joint effusion. No evidence of arthropathy or other focal bone abnormality. Soft tissues are unremarkable. IMPRESSION: Similarly appearing, transverse fracture through the inferior pole of the patella. Otherwise, no acute fracture or dislocation. Electronically Signed   By: Rance Burrows M.D.   On: 02/14/2024 21:30    Procedures Procedures    Medications Ordered in ED Medications - No data to display  ED Course/ Medical Decision Making/ A&P  X-rays repeated today showing similar appearance to 1 month ago.  There is no worsening or displacement of the fracture.  Patient to be discharged with continued use of the knee immobilizer, ice, and follow-up with her orthopedist in the next few days.  Final Clinical Impression(s) / ED Diagnoses Final diagnoses:  None    Rx / DC Orders ED Discharge Orders     None         Orvilla Blander, MD 02/15/24 [phone]

## 2024-02-15 NOTE — ED Notes (Signed)
 RN reviewed discharge instructions with pt. Pt verbalized understanding and had no further questions. VSS upon discharge.

## 2024-02-15 NOTE — Discharge Instructions (Signed)
 Follow-up with your orthopedist in the next few days.  Ice for 20 minutes every 2 hours while awake for the next 2 days.

## 2024-02-20 ENCOUNTER — Ambulatory Visit: Admitting: Surgical

## 2024-02-20 ENCOUNTER — Encounter: Payer: Self-pay | Admitting: Surgical

## 2024-02-20 ENCOUNTER — Other Ambulatory Visit (INDEPENDENT_AMBULATORY_CARE_PROVIDER_SITE_OTHER)

## 2024-02-20 ENCOUNTER — Other Ambulatory Visit: Payer: Self-pay | Admitting: Surgical

## 2024-02-20 DIAGNOSIS — S82092D Other fracture of left patella, subsequent encounter for closed fracture with routine healing: Secondary | ICD-10-CM | POA: Diagnosis not present

## 2024-02-20 MED ORDER — HYDROCODONE-ACETAMINOPHEN 5-325 MG PO TABS: 1.0000 | ORAL_TABLET | Freq: Two times a day (BID) | ORAL | 0 refills | Status: DC | PRN

## 2024-02-20 NOTE — Progress Notes (Signed)
 Post-Fracture Visit Note   Patient: Gina [name]           Date of Birth: 1976-09-21           MRN: [id number] Visit Date: 02/20/2024 PCP: Efrain Grant, PA-C   Assessment & Plan:  Chief Complaint:  Chief Complaint  Patient presents with   Left Knee - Fracture, Follow-up    DOI 01/16/2024   Visit Diagnoses:  1. Closed patellar sleeve fracture of left knee, with routine healing, subsequent encounter     Plan: Patient is a 48 year old female who presents s/p left patellar fracture sustained on 01/16/2024.  She felt a popping sensation in her left knee and did go to the ER on 4/15 though radiographs at that visit demonstrated no change in alignment of the fracture.  She states that since that event, her knee has actually been a lot better.  She is walking more and more.  No longer using any wheelchair or DME to assist her.  Still using knee immobilizer.  Left shoulder is actually bothering her a lot more than her knee is.  On exam, patient has mild tenderness over the fracture site but definitely improved compared with last visit.  She is able to perform straight leg raise without extensor lag or any significant pain multiple times.  No effusion noted.  No pain with percussion on the patella and no pain with side-to-side mobility of the patella.  Excellent quad strength rated 5/5.  Radiographs demonstrate left patellar fracture in good alignment with some decreased visibility of the fracture line in the posterior aspect of the patella.  Fracture line still visible anteriorly.  At this point from a clinical aspect, seems that she is healing this fracture.  We will let her bend her knee from 0 to 90 degrees now instead of 0 to 30 degrees and she will continue to avoid stairs or any uneven terrain.  Use the knee immobilizer for long distance walking but otherwise for short distances and around the house she can forego the immobilizer.  Follow-up in 3 weeks for clinical recheck with new  radiographs at that time and we will post her for surgery for left shoulder distal clavicle excision with arthroscopy for the week of 5/26.  She will send any MyChart message or call the office with any urgent concerns in the meantime.  Follow-Up Instructions: No follow-ups on file.   Orders:  Orders Placed This Encounter  Procedures   XR Knee 1-2 Views Left   No orders of the defined types were placed in this encounter.   Imaging: No results found.  PMFS History: Patient Active Problem List   Diagnosis Date Noted   Chronic pain in left shoulder 11/01/2023   Calcific supraspinatus tendonitis 10/03/2023   Bursitis of right shoulder 10/03/2023   Arthritis of right acromioclavicular joint 10/03/2023   S/P arthroscopy of right shoulder 09/27/2023   Persistent headaches 01/18/2023   Blurry vision 01/18/2023   Weight gain 01/18/2023   SBO (small bowel obstruction) (HCC) 09/13/2022   Nausea and vomiting 09/11/2022   Partial small bowel obstruction (HCC) 09/10/2022   Piriformis syndrome of both sides 05/31/2019   History of bipolar disorder 04/18/2019   Tardive dyskinesia 04/18/2019   Urticaria due to drug allergy 04/18/2019   Lumbar spondylosis 02/05/2019   Bipolar affective disorder, current episode mixed (HCC) 01/02/2019   Arthritis 01/02/2019   Cigarette nicotine dependence without complication 01/02/2019   Bilateral temporomandibular joint pain 10/02/2018   Referred  otalgia, bilateral 10/02/2018   Paresthesias 03/09/2018   Chronic migraine without aura, intractable, with status migrainosus 04/22/2016   Bipolar 1 disorder, mixed, moderate (HCC) 07/15/2015    Class: Chronic   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   Sciatica 01/09/2015   Reactive hypoglycemia 12/17/2013   Musculoskeletal malfunction arising from mental factors 04/04/2013   Incisional hernia 11/15/2012   Other postprocedural status(V45.89) 11/15/2012   S/P hernia surgery 11/15/2012    Abdominal pain 11/11/2012   Chronic pain syndrome 11/11/2012   Disorder of sacrum 11/11/2012   Chronic pain 12/21/2011   Heartburn 10/04/2011   DYSPHAGIA 10/04/2011   Depression with anxiety 09/15/2010   CONSTIPATION 09/15/2010   LUNG NODULE 06/17/2010   Herpes simplex virus (HSV) infection 06/11/2010   FATIGUE 06/11/2010   STRICTURE AND STENOSIS OF ESOPHAGUS 05/13/2010   ABDOMINAL WALL HERNIA 02/27/2010   ENDOMETRIOSIS 12/16/2009   GERD 10/23/2009   PALPITATIONS 10/23/2009   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   DYSTHYMIC DISORDER 06/20/2009   CONDYLOMA ACUMINATUM 04/23/2009   LENTIGO 04/23/2009   DENTAL PAIN 03/19/2009   CERVICAL RADICULOPATHY 12/11/2008   Cervical radiculopathy 12/11/2008   PANIC DISORDER WITHOUT AGORAPHOBIA 08/07/2008   TOBACCO ABUSE 08/07/2008   BACK PAIN, THORACIC REGION 07/07/2007   ALLERGIC RHINITIS 06/02/2007   Past Medical History:  Diagnosis Date   ABDOMINAL WALL HERNIA 02/27/2010   Abnormal Pap smear    ALLERGIC RHINITIS 06/02/2007   ANOREXIA, CHRONIC 08/07/2008   ANXIETY 09/15/2010   Arthritis    BACK PAIN, THORACIC REGION 07/07/2007   Bipolar disorder (HCC)    CERVICAL RADICULOPATHY 12/11/2008   Complication of anesthesia    Pateint reprots a high tolerance   Complication of anesthesia    pt states she has not had her voice completely back since surgery in Nov. 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   Eating disorder    ENDOMETRIOSIS 12/16/2009   FATIGUE 06/11/2010   Fibromyalgia    GERD 10/23/2009   Heart palpitations    HERPES SIMPLEX INFECTION 06/11/2010   LENTIGO 04/23/2009   LUNG NODULE 06/17/2010   Narcotic abuse, continuous (HCC)    pt denies   Ovarian cyst    Palpitations 10/23/2009   Panic disorder    Rheumatoid arthritis(714.0)    Stricture and stenosis of esophagus 05/13/2010   TOBACCO ABUSE 08/07/2008   Urinary  tract infection    UTI (urinary tract infection)     Family History  Problem Relation Age of Onset   Depression Mother    Arthritis Mother    Hyperlipidemia Father    Hypertension Father     Past Surgical History:  Procedure Laterality Date   ABDOMINAL SURGERY     ABDOMINAL WALL MESH  REMOVAL     ABLATION ON ENDOMETRIOSIS     CESAREAN SECTION     x2    CYSTOSCOPY N/A 11/22/2023   Procedure: CYSTOSCOPY;  Surgeon: Roxane Copp, MD;  Location: [facility];  Service: Urology;  Laterality: N/A;   DILATION AND CURETTAGE OF UTERUS     x1    ENDOMETRIAL ABLATION     esophageal dilatation x 4     GUM SURGERY     HERNIA REPAIR     X3   PARTIAL HYSTERECTOMY     SHOULDER SURGERY Right 09/2023   Social History   Occupational History   Not on file  Tobacco Use   Smoking  status: Every Day    Current packs/day: 0.20    Types: Cigarettes   Smokeless tobacco: Never  Vaping Use   Vaping status: Every Day  Substance and Sexual Activity   Alcohol use: No   Drug use: No   Sexual activity: Yes    Birth control/protection: Surgical

## 2024-03-05 ENCOUNTER — Other Ambulatory Visit: Payer: Self-pay | Admitting: Surgical

## 2024-03-05 ENCOUNTER — Telehealth: Payer: Self-pay | Admitting: Surgical

## 2024-03-05 DIAGNOSIS — S82092D Other fracture of left patella, subsequent encounter for closed fracture with routine healing: Secondary | ICD-10-CM

## 2024-03-05 DIAGNOSIS — M25562 Pain in left knee: Secondary | ICD-10-CM

## 2024-03-05 MED ORDER — HYDROCODONE-ACETAMINOPHEN 5-325 MG PO TABS
1.0000 | ORAL_TABLET | Freq: Two times a day (BID) | ORAL | 0 refills | Status: DC | PRN
Start: 2024-03-05 — End: 2024-03-13

## 2024-03-05 NOTE — Telephone Encounter (Signed)
 Patient called. Says it hurts while walking. Also she needs pain medication called in. Hydrocodone 

## 2024-03-05 NOTE — Telephone Encounter (Signed)
 Gina [name], can we order CT scan of the left knee to evaluate for nonunion?  Also I refilled her hydrocodone .  I sent her a message about all this.

## 2024-03-06 NOTE — Telephone Encounter (Signed)
 Order entered

## 2024-03-08 ENCOUNTER — Ambulatory Visit
Admission: RE | Admit: 2024-03-08 | Discharge: 2024-03-08 | Disposition: A | Discharge status: Home / Self Care | Source: Ambulatory Visit | Attending: Surgical | Admitting: Surgical

## 2024-03-08 DIAGNOSIS — S82092D Other fracture of left patella, subsequent encounter for closed fracture with routine healing: Secondary | ICD-10-CM

## 2024-03-08 DIAGNOSIS — M25562 Pain in left knee: Secondary | ICD-10-CM

## 2024-03-12 ENCOUNTER — Ambulatory Visit: Admitting: Surgical

## 2024-03-12 DIAGNOSIS — M79605 Pain in left leg: Secondary | ICD-10-CM

## 2024-03-13 ENCOUNTER — Telehealth: Payer: Self-pay | Admitting: Surgical

## 2024-03-13 ENCOUNTER — Other Ambulatory Visit: Payer: Self-pay | Admitting: Surgical

## 2024-03-13 MED ORDER — GABAPENTIN 100 MG PO CAPS: 100.0000 mg | ORAL_CAPSULE | Freq: Three times a day (TID) | ORAL | 0 refills | Status: DC

## 2024-03-13 MED ORDER — HYDROCODONE-ACETAMINOPHEN 5-325 MG PO TABS: 1.0000 | ORAL_TABLET | Freq: Two times a day (BID) | ORAL | 0 refills | Status: DC | PRN

## 2024-03-13 NOTE — Telephone Encounter (Signed)
 Patient called and said from her knee down she is numb, throbbing , and cramping badly. She wants to know if you could call her something in for pain. CB#[phone]

## 2024-03-14 ENCOUNTER — Encounter: Payer: Self-pay | Admitting: Surgical

## 2024-03-14 NOTE — Progress Notes (Signed)
 Post-fracture visit Note   Patient: Gina [name]           Date of Birth: 06/20/1976           MRN: [id number] Visit Date: 03/12/2024 PCP: Efrain Grant, PA-C   Assessment & Plan:  Chief Complaint:  Chief Complaint  Patient presents with   Left Knee - Follow-up, Fracture    DOI 01/16/2024 CT review   Visit Diagnoses:  1. Pain in left leg     Plan: Patient is a 48 year old female who presents for reevaluation of left knee inferior pole patellar fracture sustained on 01/16/2024.  She reports that the knee was initially doing very well about 2 weeks ago and then symptoms worsened after she was doing more stairs and more walking.  As a result, we ordered CT scan of the left knee.  This demonstrates osseous bridging at the posterior aspect of the fracture but there is still persistent fracture line noted anteriorly.  Overall on the sagittal view however, it looks like the fracture is about 70% healed.  She complains primarily of focal left knee pain around the inferior pole of the patella but she also has fairly new complaint of radiating pain from the lateral and medial aspect of the knee that radiates down to the top of the foot sparing the bottom of the foot.  She has sensitivity, numbness, tingling in this distribution as well.    On exam today, she does have point tenderness over the inferior pole of the patella moderately and she is able to perform straight leg raise without extensor lag.  She did not really have any significant pain with passive motion of the knee.  She has no effusion in the knee.  She does have tenderness over the course of the peroneal nerve with positive Tinel sign over the Broxson will peroneal nerve near the fibular head that reproduces radiating pain down to the top of the foot.  Additionally, she has new dorsiflexion weakness of the left foot that is distinctly different compared with the right foot.  Plantarflexion strength is excellent bilaterally.  She  has good quad and hamstring strength bilaterally.  She does have chronic low back pain but this is no worse than her typical pain.  With pain reproduced over the peroneal nerve and new dorsiflexion weakness, need nerve conduction study to evaluate for peroneal nerve neuropathy.  The ultrasound probe was placed over the EHL and anterior tibialis tendons today with no significant tendon defect noted.  Overall, impression is left knee patella fracture that seems to be healing by CT scan.  Did caution her against any significant length of walking or any uneven terrain or navigating stairs yet.  She will just stick to flat ground walking for short distances that do not exacerbate her symptoms.  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  Plan to order nerve study and follow-up after to review results.  Follow-Up Instructions: No follow-ups on file.   Orders:  Orders Placed This Encounter  Procedures   Ambulatory referral to Physical Medicine Rehab   No orders of the defined types were placed in this encounter.   Imaging: No results found.  PMFS History: Patient Active Problem List   Diagnosis Date Noted  Chronic pain in left shoulder 11/01/2023   Calcific supraspinatus tendonitis 10/03/2023   Bursitis of right shoulder 10/03/2023   Arthritis of right acromioclavicular joint 10/03/2023   S/P arthroscopy of right shoulder 09/27/2023   Persistent headaches 01/18/2023   Blurry vision 01/18/2023   Weight gain 01/18/2023   SBO (small bowel obstruction) (HCC) 09/13/2022   Nausea and vomiting 09/11/2022   Partial small bowel obstruction (HCC) 09/10/2022   Piriformis syndrome of both sides 05/31/2019   History of bipolar disorder 04/18/2019   Tardive dyskinesia 04/18/2019    Urticaria due to drug allergy 04/18/2019   Lumbar spondylosis 02/05/2019   Bipolar affective disorder, current episode mixed (HCC) 01/02/2019   Arthritis 01/02/2019   Cigarette nicotine dependence without complication 01/02/2019   Bilateral temporomandibular joint pain 10/02/2018   Referred otalgia, bilateral 10/02/2018   Paresthesias 03/09/2018   Chronic migraine without aura, intractable, with status migrainosus 04/22/2016   Bipolar 1 disorder, mixed, moderate (HCC) 07/15/2015    Class: Chronic   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   Sciatica 01/09/2015   Reactive hypoglycemia 12/17/2013   Musculoskeletal malfunction arising from mental factors 04/04/2013   Incisional hernia 11/15/2012   Other postprocedural status(V45.89) 11/15/2012   S/P hernia surgery 11/15/2012   Abdominal pain 11/11/2012   Chronic pain syndrome 11/11/2012   Disorder of sacrum 11/11/2012   Chronic pain 12/21/2011   Heartburn 10/04/2011   DYSPHAGIA 10/04/2011   Depression with anxiety 09/15/2010   CONSTIPATION 09/15/2010   LUNG NODULE 06/17/2010   Herpes simplex virus (HSV) infection 06/11/2010   FATIGUE 06/11/2010   STRICTURE AND STENOSIS OF ESOPHAGUS 05/13/2010   ABDOMINAL WALL HERNIA 02/27/2010   ENDOMETRIOSIS 12/16/2009   GERD 10/23/2009   PALPITATIONS 10/23/2009   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   DYSTHYMIC DISORDER 06/20/2009   CONDYLOMA ACUMINATUM 04/23/2009   LENTIGO 04/23/2009   DENTAL PAIN 03/19/2009   CERVICAL RADICULOPATHY 12/11/2008   Cervical radiculopathy 12/11/2008   PANIC DISORDER WITHOUT AGORAPHOBIA 08/07/2008   TOBACCO ABUSE 08/07/2008   BACK PAIN, THORACIC REGION 07/07/2007   ALLERGIC RHINITIS 06/02/2007   Past Medical History:  Diagnosis Date   ABDOMINAL WALL HERNIA 02/27/2010   Abnormal Pap smear    ALLERGIC RHINITIS 06/02/2007   ANOREXIA, CHRONIC 08/07/2008   ANXIETY 09/15/2010   Arthritis    BACK PAIN, THORACIC REGION 07/07/2007   Bipolar  disorder (HCC)    CERVICAL RADICULOPATHY 12/11/2008   Complication of anesthesia    Pateint reprots a high tolerance   Complication of anesthesia    pt states she has not had her voice completely back since surgery in Nov. 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   Eating disorder    ENDOMETRIOSIS 12/16/2009   FATIGUE 06/11/2010   Fibromyalgia    GERD 10/23/2009   Heart palpitations    HERPES SIMPLEX INFECTION 06/11/2010   LENTIGO 04/23/2009   LUNG NODULE 06/17/2010   Narcotic abuse, continuous (HCC)    pt denies   Ovarian cyst    Palpitations 10/23/2009   Panic disorder    Rheumatoid arthritis(714.0)    Stricture and stenosis of esophagus 05/13/2010   TOBACCO ABUSE 08/07/2008   Urinary tract infection    UTI (urinary tract infection)     Family History  Problem Relation Age of Onset   Depression Mother    Arthritis Mother    Hyperlipidemia Father    Hypertension Father  Past Surgical History:  Procedure Laterality Date   ABDOMINAL SURGERY     ABDOMINAL WALL MESH  REMOVAL     ABLATION ON ENDOMETRIOSIS     CESAREAN SECTION     x2    CYSTOSCOPY N/A 11/22/2023   Procedure: CYSTOSCOPY;  Surgeon: Roxane Copp, MD;  Location: [facility];  Service: Urology;  Laterality: N/A;   DILATION AND CURETTAGE OF UTERUS     x1    ENDOMETRIAL ABLATION     esophageal dilatation x 4     GUM SURGERY     HERNIA REPAIR     X3   PARTIAL HYSTERECTOMY     SHOULDER SURGERY Right 09/2023   Social History   Occupational History   Not on file  Tobacco Use   Smoking status: Every Day    Current packs/day: 0.20    Types: Cigarettes   Smokeless tobacco: Never  Vaping Use   Vaping status: Every Day  Substance and Sexual Activity   Alcohol use: No   Drug use: No   Sexual activity: Yes    Birth control/protection: Surgical

## 2024-03-23 ENCOUNTER — Encounter: Admitting: Family Medicine

## 2024-03-23 NOTE — Progress Notes (Signed)
 Surgical Instructions   Your procedure is scheduled on Thursday Mar 29, 2024. Report to [facility] Main Entrance "A" at 7:10 A.M., then check in with the Admitting office. Any questions or running late day of surgery: call [phone]  Questions prior to your surgery date: call [phone], Monday-Friday, 8am-4pm. If you experience any cold or flu symptoms such as cough, fever, chills, shortness of breath, etc. between now and your scheduled surgery, please notify us  at the above number.     Remember:  Do not eat after midnight the night before your surgery  You may drink clear liquids until 6:10 the morning of your surgery.   Clear liquids allowed are: Water, Non-Citrus Juices (without pulp), Carbonated Beverages, Clear Tea (no milk, honey, etc.), Black Coffee Only (NO MILK, CREAM OR POWDERED CREAMER of any kind), and Gatorade.  Patient Instructions  The night before surgery:  No food after midnight. ONLY clear liquids after midnight  The day of surgery (if you do NOT have diabetes):  Drink ONE (1) Pre-Surgery Clear Ensure by 6:10 the morning of surgery. Drink in one sitting. Do not sip.  This drink was given to you during your hospital  pre-op appointment visit.  Nothing else to drink after completing the  Pre-Surgery Clear Ensure.         If you have questions, please contact your surgeon's office.  Take these medicines if needed the morning of surgery with A SIP OF WATER  HYDROcodone -acetaminophen  (NORCO/VICODIN)  LORazepam  (ATIVAN )   One week prior to surgery, STOP taking any Aspirin (unless otherwise instructed by your surgeon) Aleve , Naproxen , Ibuprofen, Motrin, Advil, Goody's, BC's, all herbal medications, fish oil, and non-prescription vitamins.                     Do NOT Smoke (Tobacco/Vaping) for 24 hours prior to your procedure.  If you use a CPAP at night, you may bring your mask/headgear for your overnight stay.   You will be asked to remove any contacts,  glasses, piercing's, hearing aid's, dentures/partials prior to surgery. Please bring cases for these items if needed.    Patients discharged the day of surgery will not be allowed to drive home, and someone needs to stay with them for 24 hours.  SURGICAL WAITING ROOM VISITATION Patients may have no more than 2 support people in the waiting area - these visitors may rotate.   Pre-op nurse will coordinate an appropriate time for 1 ADULT support person, who may not rotate, to accompany patient in pre-op.  Children under the age of 73 must have an adult with them who is not the patient and must remain in the main waiting area with an adult.  If the patient needs to stay at the hospital during part of their recovery, the visitor guidelines for inpatient rooms apply.  Please refer to the [facility] website for the visitor guidelines for any additional information.   If you received a COVID test during your pre-op visit  it is requested that you wear a mask when out in public, stay away from anyone that may not be feeling well and notify your surgeon if you develop symptoms. If you have been in contact with anyone that has tested positive in the last 10 days please notify you surgeon.     [place]- Preparing for Total Shoulder Arthroplasty or Arthroscopy  Before surgery, you can play an important role. Because skin is not sterile, your skin needs to be as free  of germs as possible. You can reduce the number of germs on your skin by using the following products.   Benzoyl Peroxide Gel  o Reduces the number of germs present on the skin  o Applied twice a day to shoulder area starting two days before surgery   Chlorhexidine  Gluconate (CHG) Soap (instructions listed above on how to wash with CHG Soap)  o An antiseptic cleaner that kills germs and bonds with the skin to continue killing germs even after washing  o Used for showering the night before surgery and morning of  surgery   ==================================================================  Please follow these instructions carefully:  BENZOYL PEROXIDE 5% GEL  Please do not use if you have an allergy to benzoyl peroxide. If your skin becomes reddened/irritated stop using the benzoyl peroxide.  Starting two days before surgery, apply as follows:  1. Apply benzoyl peroxide in the morning and at night. Apply after taking a shower. If you are not taking a shower clean entire shoulder front, back, and side along with the armpit with a clean wet washcloth.  2. Place a quarter-sized dollop on your SHOULDER and rub in thoroughly, making sure to cover the front, back, and side of your shoulder, along with the armpit.   2 Days prior to Surgery First Dose on _____________ Morning Second Dose on ______________ Night  Day Before Surgery First Dose on ______________ Morning Night before surgery wash (entire body except face and private areas) with CHG Soap THEN Second Dose on ____________ Night   Morning of Surgery  wash BODY AGAIN with CHG Soap   4. Do NOT apply benzoyl peroxide gel on the day of surgery       Pre-operative 5 CHG Bathing Instructions   You can play a key role in reducing the risk of infection after surgery. Your skin needs to be as free of germs as possible. You can reduce the number of germs on your skin by washing with CHG (chlorhexidine  gluconate) soap before surgery. CHG is an antiseptic soap that kills germs and continues to kill germs even after washing.   DO NOT use if you have an allergy to chlorhexidine /CHG or antibacterial soaps. If your skin becomes reddened or irritated, stop using the CHG and notify one of our RNs at [phone].   Please shower with the CHG soap starting 4 days before surgery using the following schedule:     Please keep in mind the following:  DO NOT shave, including legs and underarms, starting the day of your first shower.   You may shave  your face at any point before/day of surgery.  Place clean sheets on your bed the day you start using CHG soap. Use a clean washcloth (not used since being washed) for each shower. DO NOT sleep with pets once you start using the CHG.   CHG Shower Instructions:  Wash your face and private area with normal soap. If you choose to wash your hair, wash first with your normal shampoo.  After you use shampoo/soap, rinse your hair and body thoroughly to remove shampoo/soap residue.  Turn the water OFF and apply about 3 tablespoons (45 ml) of CHG soap to a CLEAN washcloth.  Apply CHG soap ONLY FROM YOUR NECK DOWN TO YOUR TOES (washing for 3-5 minutes)  DO NOT use CHG soap on face, private areas, open wounds, or sores.  Pay special attention to the area where your surgery is being performed.  If you are having back surgery, having someone wash  your back for you may be helpful. Wait 2 minutes after CHG soap is applied, then you may rinse off the CHG soap.  Pat dry with a clean towel  Put on clean clothes/pajamas   If you choose to wear lotion, please use ONLY the CHG-compatible lotions that are listed below.  Additional instructions for the day of surgery: DO NOT APPLY any lotions, deodorants, cologne, or perfumes.   Do not bring valuables to the hospital. [facility] is not responsible for any belongings/valuables. Do not wear nail polish, gel polish, artificial nails, or any other type of covering on natural nails (fingers and toes) Do not wear jewelry or makeup Put on clean/comfortable clothes.  Please brush your teeth.  Ask your nurse before applying any prescription medications to the skin.     CHG Compatible Lotions   Aveeno Moisturizing lotion  Cetaphil Moisturizing Cream  Cetaphil Moisturizing Lotion  Clairol Herbal Essence Moisturizing Lotion, Dry Skin  Clairol Herbal Essence Moisturizing Lotion, Extra Dry Skin  Clairol Herbal Essence Moisturizing Lotion, Normal Skin  Curel Age  Defying Therapeutic Moisturizing Lotion with Alpha Hydroxy  Curel Extreme Care Body Lotion  Curel Soothing Hands Moisturizing Hand Lotion  Curel Therapeutic Moisturizing Cream, Fragrance-Free  Curel Therapeutic Moisturizing Lotion, Fragrance-Free  Curel Therapeutic Moisturizing Lotion, Original Formula  Eucerin Daily Replenishing Lotion  Eucerin Dry Skin Therapy Plus Alpha Hydroxy Crme  Eucerin Dry Skin Therapy Plus Alpha Hydroxy Lotion  Eucerin Original Crme  Eucerin Original Lotion  Eucerin Plus Crme Eucerin Plus Lotion  Eucerin TriLipid Replenishing Lotion  Keri Anti-Bacterial Hand Lotion  Keri Deep Conditioning Original Lotion Dry Skin Formula Softly Scented  Keri Deep Conditioning Original Lotion, Fragrance Free Sensitive Skin Formula  Keri Lotion Fast Absorbing Fragrance Free Sensitive Skin Formula  Keri Lotion Fast Absorbing Softly Scented Dry Skin Formula  Keri Original Lotion  Keri Skin Renewal Lotion Keri Silky Smooth Lotion  Keri Silky Smooth Sensitive Skin Lotion  Nivea Body Creamy Conditioning Oil  Nivea Body Extra Enriched Lotion  Nivea Body Original Lotion  Nivea Body Sheer Moisturizing Lotion Nivea Crme  Nivea Skin Firming Lotion  NutraDerm 30 Skin Lotion  NutraDerm Skin Lotion  NutraDerm Therapeutic Skin Cream  NutraDerm Therapeutic Skin Lotion  ProShield Protective Hand Cream  Provon moisturizing lotion  Please read over the following fact sheets that you were given.

## 2024-03-25 ENCOUNTER — Emergency Department (HOSPITAL_COMMUNITY)
Admission: EM | Admit: 2024-03-25 | Discharge: 2024-03-25 | Disposition: A | Discharge status: Home / Self Care | Attending: Emergency Medicine | Admitting: Emergency Medicine

## 2024-03-25 ENCOUNTER — Emergency Department (HOSPITAL_COMMUNITY)

## 2024-03-25 ENCOUNTER — Other Ambulatory Visit: Payer: Self-pay

## 2024-03-25 DIAGNOSIS — R531 Weakness: Secondary | ICD-10-CM | POA: Insufficient documentation

## 2024-03-25 DIAGNOSIS — R519 Headache, unspecified: Secondary | ICD-10-CM | POA: Diagnosis present

## 2024-03-25 DIAGNOSIS — R200 Anesthesia of skin: Secondary | ICD-10-CM | POA: Insufficient documentation

## 2024-03-25 DIAGNOSIS — R112 Nausea with vomiting, unspecified: Secondary | ICD-10-CM | POA: Diagnosis not present

## 2024-03-25 LAB — CBC
HCT: 44.3 % (ref 36.0–46.0)
Hemoglobin: 15 g/dL (ref 12.0–15.0)
MCH: 31.8 pg (ref 26.0–34.0)
MCHC: 33.9 g/dL (ref 30.0–36.0)
MCV: 94.1 fL (ref 80.0–100.0)
Platelets: 251 10*3/uL (ref 150–400)
RBC: 4.71 MIL/uL (ref 3.87–5.11)
RDW: 12.2 % (ref 11.5–15.5)
WBC: 7.3 10*3/uL (ref 4.0–10.5)
nRBC: 0 % (ref 0.0–0.2)

## 2024-03-25 LAB — COMPREHENSIVE METABOLIC PANEL WITH GFR
ALT: 18 U/L (ref 0–44)
AST: 17 U/L (ref 15–41)
Albumin: 3.6 g/dL (ref 3.5–5.0)
Alkaline Phosphatase: 101 U/L (ref 38–126)
Anion gap: 6 (ref 5–15)
BUN: 7 mg/dL (ref 6–20)
CO2: 25 mmol/L (ref 22–32)
Calcium: 8.7 mg/dL — ABNORMAL LOW (ref 8.9–10.3)
Chloride: 107 mmol/L (ref 98–111)
Creatinine, Ser: 0.7 mg/dL (ref 0.44–1.00)
GFR, Estimated: >60 mL/min (ref >60)
Glucose, Bld: 89 mg/dL (ref 70–99)
Potassium: 4.3 mmol/L (ref 3.5–5.1)
Sodium: 138 mmol/L (ref 135–145)
Total Bilirubin: 0.5 mg/dL (ref 0.0–1.2)
Total Protein: 6.6 g/dL (ref 6.5–8.1)

## 2024-03-25 LAB — I-STAT CHEM 8, ED
BUN: 8 mg/dL (ref 6–20)
Calcium, Ion: 1.13 mmol/L — ABNORMAL LOW (ref 1.15–1.40)
Chloride: 103 mmol/L (ref 98–111)
Creatinine, Ser: 0.6 mg/dL (ref 0.44–1.00)
Glucose, Bld: 82 mg/dL (ref 70–99)
HCT: 45 % (ref 36.0–46.0)
Hemoglobin: 15.3 g/dL — ABNORMAL HIGH (ref 12.0–15.0)
Potassium: 4.3 mmol/L (ref 3.5–5.1)
Sodium: 139 mmol/L (ref 135–145)
TCO2: 23 mmol/L (ref 22–32)

## 2024-03-25 LAB — DIFFERENTIAL
Abs Immature Granulocytes: 0.03 10*3/uL (ref 0.00–0.07)
Basophils Absolute: 0 10*3/uL (ref 0.0–0.1)
Basophils Relative: 0 %
Eosinophils Absolute: 0.1 10*3/uL (ref 0.0–0.5)
Eosinophils Relative: 2 %
Immature Granulocytes: 0 %
Lymphocytes Relative: 35 %
Lymphs Abs: 2.6 10*3/uL (ref 0.7–4.0)
Monocytes Absolute: 0.6 10*3/uL (ref 0.1–1.0)
Monocytes Relative: 8 %
Neutro Abs: 3.9 10*3/uL (ref 1.7–7.7)
Neutrophils Relative %: 55 %

## 2024-03-25 LAB — CBG MONITORING, ED: Glucose-Capillary: 86 mg/dL (ref 70–99)

## 2024-03-25 MED ORDER — DIPHENHYDRAMINE HCL 50 MG/ML IJ SOLN
12.5000 mg | Freq: Once | INTRAMUSCULAR | Status: AC
  Administered 2024-03-25: 12.5 mg via INTRAVENOUS
  Filled 2024-03-25: qty 1

## 2024-03-25 MED ORDER — LORAZEPAM 2 MG/ML IJ SOLN
0.5000 mg | Freq: Once | INTRAMUSCULAR | Status: AC | PRN
  Administered 2024-03-25: 0.5 mg via INTRAVENOUS
  Filled 2024-03-25: qty 1

## 2024-03-25 MED ORDER — IOPAMIDOL (ISOVUE-370) INJECTION 76%
75.0000 mL | Freq: Once | INTRAVENOUS | Status: AC | PRN
Start: 2024-03-25 — End: 2024-03-25
  Administered 2024-03-25: 75 mL via INTRAVENOUS

## 2024-03-25 MED ORDER — PROCHLORPERAZINE EDISYLATE 10 MG/2ML IJ SOLN
10.0000 mg | Freq: Once | INTRAMUSCULAR | Status: AC
  Administered 2024-03-25: 10 mg via INTRAVENOUS
  Filled 2024-03-25: qty 2

## 2024-03-25 MED ORDER — KETAMINE HCL 50 MG/5ML IJ SOSY
0.1500 mg/kg | PREFILLED_SYRINGE | Freq: Once | INTRAMUSCULAR | Status: AC
  Administered 2024-03-25: 15 mg via INTRAVENOUS
  Filled 2024-03-25: qty 5

## 2024-03-25 NOTE — ED Triage Notes (Addendum)
 Pt BIB [facility] from home for L. Side heaviness beginning today about an hr ago  dizziness, numbness, pins and needles and HA intermittently throughout the week.  Pt has a hx of chronic back problems, L. Shoulder pain and chronic pain. Negative stroke screen for EMS. EMS endorses that pt bore weight for them though appearing unsteady at times.   A7O, VSS

## 2024-03-25 NOTE — ED Notes (Signed)
 Pt ambulating to bedside toilet and back with assistance.

## 2024-03-25 NOTE — ED Provider Notes (Signed)
 [facility]ERGENCY DEPARTMENT AT [facility] Provider Note   CSN: [id number] Arrival date & time: 03/25/24  1412     History  Chief complaint, headache, left arm heaviness  Gina [name] is a 48 y.o. female.  HPI   Patient has a history of anxiety anorexia cervical radiculopathy endometriosis, fatigue, palpitations fibromyalgia, panic disorder bipolar disorder.  Patient states she has been having issues over this past week.  Patient states she started having pins and needle sensation in weakness and heaviness on the left side of her body.  Patient also was having episodes where she felt like she was getting an electric shock on the left side.  Patient states she was on her honeymoon so she did not want to go to a hospital to get it checked out.  Her symptoms started to get better.  Patient started having symptoms again upon returning home.  Patient states she continues to feel heaviness on the left side of her body.  She feels pins and needle sensation.  She has also had some episodes of nausea and vomiting.  She does have a mild headache.  Patient denies any prior history of stroke.  She denies migraine headache history.  Home Medications Prior to Admission medications   Medication Sig Start Date End Date Taking? Authorizing Provider  gabapentin  (NEURONTIN ) 100 MG capsule Take 1 capsule (100 mg total) by mouth 3 (three) times daily. Patient not taking: Reported on 03/16/2024 03/13/24   Magnant, Justice Olp, PA-C  calcium carbonate (TUMS - DOSED IN MG ELEMENTAL CALCIUM) 500 MG chewable tablet Chew 1 tablet by mouth 4 (four) times daily as needed for indigestion or heartburn.    [provider]  Cholecalciferol (VITAMIN D3 PO) Take 2 each by mouth daily. Gummy    [provider]  ELESTRIN 0.52 MG/0.87 GM (0.06%) GEL Apply 1 Application topically every evening. 10/03/23   [provider]  escitalopram  (LEXAPRO ) 20 MG tablet Take 20 mg by mouth at  bedtime. 12/20/18   [provider]  HYDROcodone -acetaminophen  (NORCO/VICODIN) 5-325 MG tablet Take 1 tablet by mouth every 12 (twelve) hours as needed for moderate pain (pain score 4-6). Patient taking differently: Take 1 tablet by mouth in the morning and at bedtime. 03/13/24   Magnant, Charles L, PA-C  LORazepam  (ATIVAN ) 1 MG tablet Take 1 mg by mouth every 8 (eight) hours as needed for anxiety.    [provider]  lurasidone  (LATUDA ) 40 MG TABS tablet Take 40 mg by mouth every evening. 12/16/22   [provider]      Allergies    Lamictal [lamotrigine], Maxalt  [rizatriptan ], Morphine  and codeine , Nicotine, Zofran , Chantix  [varenicline  tartrate], Ciprofloxacin , Codeine , Haldol  [haloperidol ], Ibuprofen, Lidoderm  [lidocaine ], Skelaxin [metaxalone], Sulfa antibiotics, Sulfasalazine, Toradol  [ketorolac  tromethamine ], Ambien [zolpidem], Doxycycline , Duloxetine, Ingrezza [valbenazine tosylate], Lyrica [pregabalin], Methadone, Neurontin  [gabapentin ], Other, Prednisone , Amoxicillin, Aripiprazole, Chlorhexidine , Penicillins, and Tramadol     Review of Systems   Review of Systems  Physical Exam Updated Vital Signs BP 116/73   Pulse 64   Resp 11   LMP 01/01/2004   SpO2 100%  Physical Exam Vitals and nursing note reviewed.  Constitutional:      General: She is not in acute distress.    Appearance: She is well-developed.  HENT:     Head: Normocephalic and atraumatic.     Right Ear: External ear normal.     Left Ear: External ear normal.  Eyes:     General: No visual field deficit or scleral  icterus.       Right eye: No discharge.        Left eye: No discharge.     Conjunctiva/sclera: Conjunctivae normal.  Neck:     Trachea: No tracheal deviation.  Cardiovascular:     Rate and Rhythm: Normal rate and regular rhythm.  Pulmonary:     Effort: Pulmonary effort is normal. No respiratory distress.     Breath sounds: Normal breath sounds. No stridor. No wheezing or rales.   Abdominal:     General: Bowel sounds are normal. There is no distension.     Palpations: Abdomen is soft.     Tenderness: There is no abdominal tenderness. There is no guarding or rebound.  Musculoskeletal:        General: No tenderness.     Cervical back: Neck supple.  Skin:    General: Skin is warm and dry.     Findings: No rash.  Neurological:     Mental Status: She is alert and oriented to person, place, and time.     Cranial Nerves: No cranial nerve deficit, dysarthria or facial asymmetry.     Sensory: No sensory deficit.     Motor: No abnormal muscle tone, seizure activity or pronator drift.     Coordination: Coordination normal.     Comments:  able to hold both legs off bed for 5 seconds, sensation intact in all extremities,  no left or right sided neglect, normal finger-nose exam bilaterally, no nystagmus noted   Psychiatric:        Mood and Affect: Mood normal.     ED Results / Procedures / Treatments   Labs (all labs ordered are listed, but only abnormal results are displayed) Labs Reviewed  CBC  DIFFERENTIAL  COMPREHENSIVE METABOLIC PANEL WITH GFR  CBG MONITORING, ED  I-STAT CHEM 8, ED    EKG None  Radiology No results found.  Procedures Procedures    Medications Ordered in ED Medications  prochlorperazine  (COMPAZINE ) injection 10 mg (has no administration in time range)  diphenhydrAMINE  (BENADRYL ) injection 12.5 mg (has no administration in time range)    ED Course/ Medical Decision Making/ A&P                                 Medical Decision Making Differential diagnosis includes but not limited to stroke, complex migraine, cerebral hemorrhage,   Amount and/or Complexity of Data Reviewed Labs: ordered. Radiology: ordered.  Risk Prescription drug management.   No focal deficits noted on exam at this time.  Will proceed with further stroke evaluation including CT head CT angio.  Will try migraine cocktail as the symptoms are suggestive of  possible complex migraine.  Maxalt  listed as one of her allergies.  I wonder if she has been previously treated for migraines in the past.   Care turned over at shift change.        Final Clinical Impression(s) / ED Diagnoses Final diagnoses:  None    Rx / DC Orders ED Discharge Orders     None         Trish Furl, MD 03/25/24 1451

## 2024-03-25 NOTE — ED Notes (Signed)
 Patient transported to Ultrasound

## 2024-03-25 NOTE — ED Notes (Signed)
 Called Arlyce Lambert PharmD. @[phone]  To clarify Pain dose of Ketamine  15 mg IV (SEE MAR) for RN administration (approved). Double checked with Charge nurse Bridgette Campus (approved), and face to face verified order with MD Kommor (approved). All cleared to give patient pain dose of ketamine  (SEE MAR) patient is A&Ox4, GCS 15, Respirations even and unlabored, pain 7/10 in head and left leg (SEE CHART for assessment).

## 2024-03-25 NOTE — ED Notes (Signed)
 Pt adv. This paramedic that she is extremely claustrophobic. Pt medicated prior to MRI. MRI contacted by phone and adv. To call when pt has been medicated and ready. MRI notified pt is ready for transport.

## 2024-03-25 NOTE — ED Notes (Signed)
 Pt reports increased pain in L leg. MD notified.

## 2024-03-27 ENCOUNTER — Ambulatory Visit: Admitting: Physical Medicine and Rehabilitation

## 2024-03-27 ENCOUNTER — Encounter (HOSPITAL_COMMUNITY): Payer: Self-pay

## 2024-03-27 ENCOUNTER — Encounter (HOSPITAL_COMMUNITY)
Admission: RE | Admit: 2024-03-27 | Discharge: 2024-03-27 | Disposition: A | Discharge status: Home / Self Care | Source: Ambulatory Visit | Attending: Orthopedic Surgery | Admitting: Orthopedic Surgery

## 2024-03-27 ENCOUNTER — Other Ambulatory Visit: Payer: Self-pay

## 2024-03-27 DIAGNOSIS — R202 Paresthesia of skin: Secondary | ICD-10-CM

## 2024-03-27 DIAGNOSIS — Z01818 Encounter for other preprocedural examination: Secondary | ICD-10-CM | POA: Insufficient documentation

## 2024-03-27 DIAGNOSIS — R29898 Other symptoms and signs involving the musculoskeletal system: Secondary | ICD-10-CM

## 2024-03-27 DIAGNOSIS — M25562 Pain in left knee: Secondary | ICD-10-CM

## 2024-03-27 DIAGNOSIS — G8929 Other chronic pain: Secondary | ICD-10-CM

## 2024-03-27 LAB — BASIC METABOLIC PANEL WITH GFR
Anion gap: 6 (ref 5–15)
BUN: 11 mg/dL (ref 6–20)
CO2: 26 mmol/L (ref 22–32)
Calcium: 8.6 mg/dL — ABNORMAL LOW (ref 8.9–10.3)
Chloride: 105 mmol/L (ref 98–111)
Creatinine, Ser: 0.64 mg/dL (ref 0.44–1.00)
GFR, Estimated: >60 mL/min (ref >60)
Glucose, Bld: 90 mg/dL (ref 70–99)
Potassium: 4.6 mmol/L (ref 3.5–5.1)
Sodium: 137 mmol/L (ref 135–145)

## 2024-03-27 LAB — CBC
HCT: 42.3 % (ref 36.0–46.0)
Hemoglobin: 14.6 g/dL (ref 12.0–15.0)
MCH: 32.8 pg (ref 26.0–34.0)
MCHC: 34.5 g/dL (ref 30.0–36.0)
MCV: 95.1 fL (ref 80.0–100.0)
Platelets: 241 10*3/uL (ref 150–400)
RBC: 4.45 MIL/uL (ref 3.87–5.11)
RDW: 12.4 % (ref 11.5–15.5)
WBC: 8.9 10*3/uL (ref 4.0–10.5)
nRBC: 0 % (ref 0.0–0.2)

## 2024-03-27 NOTE — Progress Notes (Signed)
 PCP - Rebecca Campus, NP Cardiologist - Denies  PPM/ICD - Denies Device Orders - n/a Rep Notified - n/a  Chest x-ray - 11/04/2023 EKG - 03/25/2024 Stress Test - Denies ECHO - Denies Cardiac Cath - Denies  Sleep Study - Denies - Per pt, she has been tested, but apnea was not severe enough to diagnose  No DM  Last dose of GLP1 agonist- n/a GLP1 instructions: n/a  Blood Thinner Instructions: n/a Aspirin Instructions: n/a  ERAS Protcol - Clear liquids until 0610 morning of surgery PRE-SURGERY Ensure or G2- Pt declined Ensure drink due to it making her nauseous  COVID TEST- n/a   Anesthesia review: No  Patient denies shortness of breath, fever, cough and chest pain at PAT appointment. Pt denies any respiratory illness/infection in the last two months.    All instructions explained to the patient, with a verbal understanding of the material. Patient agrees to go over the instructions while at home for a better understanding. Patient also instructed to self quarantine after being tested for COVID-19. The opportunity to ask questions was provided.

## 2024-03-27 NOTE — Pre-Procedure Instructions (Signed)
 Surgical Instructions   Your procedure is scheduled on Mar 29, 2024. Report to [facility] Main Entrance "A" at 7:10 A.M., then check in with the Admitting office. Any questions or running late day of surgery: call [phone]  Questions prior to your surgery date: call [phone], Monday-Friday, 8am-4pm. If you experience any cold or flu symptoms such as cough, fever, chills, shortness of breath, etc. between now and your scheduled surgery, please notify us  at the above number.     Remember:  Do not eat after midnight the night before your surgery   You may drink clear liquids until 6:10 AM the morning of your surgery.   Clear liquids allowed are: Water, Non-Citrus Juices (without pulp), Carbonated Beverages, Clear Tea (no milk, honey, etc.), Black Coffee Only (NO MILK, CREAM OR POWDERED CREAMER of any kind), and Gatorade.  Patient Instructions  The night before surgery:  No food after midnight. ONLY clear liquids after midnight  The day of surgery (if you do NOT have diabetes):  Drink ONE (1) Pre-Surgery Clear Ensure by 6:10 AM the morning of surgery. Drink in one sitting. Do not sip.  This drink was given to you during your hospital  pre-op appointment visit.  Nothing else to drink after completing the  Pre-Surgery Clear Ensure.         If you have questions, please contact your surgeon's office.   Take these medicines the morning of surgery with A SIP OF WATER: HYDROcodone -acetaminophen  (NORCO/VICODIN)    May take these medicines IF NEEDED: LORazepam  (ATIVAN )    One week prior to surgery, STOP taking any Aspirin (unless otherwise instructed by your surgeon) Aleve , Naproxen , Ibuprofen, Motrin, Advil, Goody's, BC's, all herbal medications, fish oil, and non-prescription vitamins.                     Do NOT Smoke (Tobacco/Vaping) for 24 hours prior to your procedure.  If you use a CPAP at night, you may bring your mask/headgear for your overnight stay.   You will  be asked to remove any contacts, glasses, piercing's, hearing aid's, dentures/partials prior to surgery. Please bring cases for these items if needed.    Patients discharged the day of surgery will not be allowed to drive home, and someone needs to stay with them for 24 hours.  SURGICAL WAITING ROOM VISITATION Patients may have no more than 2 support people in the waiting area - these visitors may rotate.   Pre-op nurse will coordinate an appropriate time for 1 ADULT support person, who may not rotate, to accompany patient in pre-op.  Children under the age of 79 must have an adult with them who is not the patient and must remain in the main waiting area with an adult.  If the patient needs to stay at the hospital during part of their recovery, the visitor guidelines for inpatient rooms apply.  Please refer to the [facility] website for the visitor guidelines for any additional information.   If you received a COVID test during your pre-op visit  it is requested that you wear a mask when out in public, stay away from anyone that may not be feeling well and notify your surgeon if you develop symptoms. If you have been in contact with anyone that has tested positive in the last 10 days please notify you surgeon.      Pre-operative CHG Bathing Instructions   You can play a key role in reducing the risk of infection after surgery.  Your skin needs to be as free of germs as possible. You can reduce the number of germs on your skin by washing with CHG (chlorhexidine  gluconate) soap before surgery. CHG is an antiseptic soap that kills germs and continues to kill germs even after washing.   DO NOT use if you have an allergy to chlorhexidine /CHG or antibacterial soaps. If your skin becomes reddened or irritated, stop using the CHG and notify one of our RNs at [phone].              TAKE A SHOWER THE NIGHT BEFORE SURGERY AND THE DAY OF SURGERY    Please keep in mind the following:  DO NOT  shave, including legs and underarms, 48 hours prior to surgery.   You may shave your face before/day of surgery.  Place clean sheets on your bed the night before surgery Use a clean washcloth (not used since being washed) for each shower. DO NOT sleep with pet's night before surgery.  CHG Shower Instructions:  Wash your face and private area with normal soap. If you choose to wash your hair, wash first with your normal shampoo.  After you use shampoo/soap, rinse your hair and body thoroughly to remove shampoo/soap residue.  Turn the water OFF and apply half the bottle of CHG soap to a CLEAN washcloth.  Apply CHG soap ONLY FROM YOUR NECK DOWN TO YOUR TOES (washing for 3-5 minutes)  DO NOT use CHG soap on face, private areas, open wounds, or sores.  Pay special attention to the area where your surgery is being performed.  If you are having back surgery, having someone wash your back for you may be helpful. Wait 2 minutes after CHG soap is applied, then you may rinse off the CHG soap.  Pat dry with a clean towel  Put on clean pajamas    Additional instructions for the day of surgery: DO NOT APPLY any lotions, deodorants, cologne, or perfumes.   Do not wear jewelry or makeup Do not wear nail polish, gel polish, artificial nails, or any other type of covering on natural nails (fingers and toes) Do not bring valuables to the hospital. [facility] is not responsible for valuables/personal belongings. Put on clean/comfortable clothes.  Please brush your teeth.  Ask your nurse before applying any prescription medications to the skin.   [place]- Preparing for Total Shoulder Arthroplasty  Before surgery, you can play an important role. Because skin is not sterile, your skin needs to be as free of germs as possible. You can reduce the number of germs on your skin by using the following products.   Benzoyl Peroxide Gel  o Reduces the number of germs present on the skin  o Applied twice a  day to shoulder area starting two days before surgery   Chlorhexidine  Gluconate (CHG) Soap (instructions listed above on how to wash with CHG Soap)  o An antiseptic cleaner that kills germs and bonds with the skin to continue killing germs even after washing  o Used for showering the night before surgery and morning of surgery   ==================================================================  Please follow these instructions carefully:  BENZOYL PEROXIDE 5% GEL  Please do not use if you have an allergy to benzoyl peroxide. If your skin becomes reddened/irritated stop using the benzoyl peroxide.  Starting two days before surgery, apply as follows:  1. Apply benzoyl peroxide in the morning and at night. Apply after taking a shower. If you are not taking a shower clean entire  shoulder front, back, and side along with the armpit with a clean wet washcloth.  2. Place a quarter-sized dollop on your SHOULDER and rub in thoroughly, making sure to cover the front, back, and side of your shoulder, along with the armpit.   2 Days prior to Surgery First Dose on _____________ Morning Second Dose on ______________ Night  Day Before Surgery First Dose on ______________ Morning Night before surgery wash (entire body except face and private areas) with CHG Soap THEN Second Dose on ____________ Night   Morning of Surgery  wash BODY AGAIN with CHG Soap   4. Do NOT apply benzoyl peroxide gel on the day of surgery

## 2024-03-27 NOTE — Progress Notes (Signed)
 Pain Scale   Average Pain 5 Patient is Right Hand dominate Patient advising she has constant pain in her left leg to left foot and has little to no relief         +Driver, -BT, -Dye Allergies.

## 2024-03-28 ENCOUNTER — Encounter (HOSPITAL_COMMUNITY): Payer: Self-pay | Admitting: Orthopedic Surgery

## 2024-03-28 NOTE — Anesthesia Preprocedure Evaluation (Addendum)
 Anesthesia Evaluation  Patient identified by MRN, date of birth, ID band Patient awake  General Assessment Comment:Patient states voice not the same since 09/2023 anesthetic when she was intubated  Reviewed: Allergy & Precautions, H&P , NPO status , Patient's Chart, lab work & pertinent test results  History of Anesthesia Complications (+) history of anesthetic complications  Airway Mallampati: II  TM Distance: >3 FB     Dental  (+) Dental Advisory Given, Teeth Intact   Pulmonary Current SmokerPatient did not abstain from smoking.   Pulmonary exam normal breath sounds clear to auscultation       Cardiovascular negative cardio ROS Normal cardiovascular exam Rhythm:Regular Rate:Normal     Neuro/Psych  Headaches PSYCHIATRIC DISORDERS Anxiety Depression Bipolar Disorder    Neuromuscular disease    GI/Hepatic Neg liver ROS,GERD  Medicated,,  Endo/Other  Obesity  Renal/GU Renal disease  negative genitourinary   Musculoskeletal  (+) Arthritis , Osteoarthritis,  Fibromyalgia - Primary osteoarthritis of left shoulder   Abdominal  (+) + obese  Peds negative pediatric ROS (+)  Hematology negative hematology ROS (+)   Anesthesia Other Findings   Reproductive/Obstetrics negative OB ROS                              Anesthesia Physical Anesthesia Plan  ASA: 3  Anesthesia Plan: General and Regional   Post-op Pain Management: Regional block* and Minimal or no pain anticipated   Induction: Intravenous  PONV Risk Score and Plan: 3 and Ondansetron , Dexamethasone , Midazolam  and Treatment may vary due to age or medical condition  Airway Management Planned: Oral ETT and Video Laryngoscope Planned  Additional Equipment: None  Intra-op Plan:   Post-operative Plan: Extubation in OR  Informed Consent: I have reviewed the patients History and Physical, chart, labs and discussed the procedure including  the risks, benefits and alternatives for the proposed anesthesia with the patient or authorized representative who has indicated his/her understanding and acceptance.     Dental advisory given  Plan Discussed with: CRNA and Anesthesiologist  Anesthesia Plan Comments:          Anesthesia Quick Evaluation

## 2024-03-28 NOTE — Procedures (Signed)
 EMG & NCV Findings: All nerve conduction studies (as indicated in the following tables) were within normal limits.    All examined muscles (as indicated in the following table) showed no evidence of electrical instability.    Impression: Essentially NORMAL electrodiagnostic study of the left lower limb.  There is no significant electrodiagnostic evidence of nerve entrapment, lumbosacral plexopathy or lumbar radiculopathy.    As you know, purely sensory or demyelinating radiculopathies and chemical radiculitis may not be detected with this particular electrodiagnostic study. **This electrodiagnostic study cannot rule out small fiber polyneuropathy and dysesthesias from central pain syndromes such as stroke or central pain sensitization syndromes such as fibromyalgia.  Myotomal referral pain from trigger points is also not excluded.  Recommendations: 1.  Follow-up with referring physician. 2.  Continue current management of symptoms.  ___________________________ Collin Deal FAAPMR Board Certified, American Board of Physical Medicine and Rehabilitation    Nerve Conduction Studies Anti Sensory Summary Table   Stim Site NR Peak (ms) Norm Peak (ms) P-T Amp (V) Norm P-T Amp Site1 Site2 Delta-P (ms) Dist (cm) Vel (m/s) Norm Vel (m/s)  Left Saphenous Anti Sensory (Ant Med Mall)  28.2C  14cm    3.5 <4.4 19.2 >2 14cm Ant Med Mall 3.5 0.0  >32  Left Sup Fibular Anti Sensory (Ant Lat Mall)  28.2C  14 cm    3.8 <4.4 19.2 >5.0 14 cm Ant Lat Mall 3.8 14.0 37 >32  Left Sural Anti Sensory (Lat Mall)  28.1C  Calf    3.7 <4.0 23.5 >5.0 Calf Lat Mall 3.7 14.0 38 >35   Motor Summary Table   Stim Site NR Onset (ms) Norm Onset (ms) O-P Amp (mV) Norm O-P Amp Site1 Site2 Delta-0 (ms) Dist (cm) Vel (m/s) Norm Vel (m/s)  Left Dp Br Fibular Motor (AntTibialis)  28.3C  Fib Head    2.1 <4.2 2.8  Poplit Fib Head 1.6 11.0 69 >40.5  Poplit    3.7 <5.7 3.0         Left Tibial Motor (Abd Hall Brev)  28.3C   Ankle    3.8 <6.1 20.6 >3.0 Knee Ankle 8.5 40.0 47 >35  Knee    12.3  13.4          EMG   Side Muscle Nerve Root Ins Act Fibs Psw Amp Dur Poly Recrt Int Deatra Face Comment  Left AntTibialis Dp Br Peron L4-5 Nml Nml Nml Nml Nml 0 Nml Nml   Left Fibularis Longus  Sup Br Peron L5-S1 Nml Nml Nml Nml Nml 0 Nml Nml   Left MedGastroc Tibial S1-2 Nml Nml Nml Nml Nml 0 Nml Nml   Left VastusMed Femoral L2-4 Nml Nml Nml Nml Nml 0 Nml Nml   Left BicepsFemS Sciatic L5-S1 Nml Nml Nml Nml Nml 0 Nml Nml     Nerve Conduction Studies Anti Sensory Left/Right Comparison   Stim Site L Lat (ms) R Lat (ms) L-R Lat (ms) L Amp (V) R Amp (V) L-R Amp (%) Site1 Site2 L Vel (m/s) R Vel (m/s) L-R Vel (m/s)  Saphenous Anti Sensory (Ant Med Mall)  28.2C  14cm 3.5   19.2   14cm Ant Med Mall     Sup Fibular Anti Sensory (Ant Lat Mall)  28.2C  14 cm 3.8   19.2   14 cm Ant Lat Mall 37    Sural Anti Sensory (Lat Mall)  28.1C  Calf 3.7   23.5   Calf Lat Mall 38     Motor  Left/Right Comparison   Stim Site L Lat (ms) R Lat (ms) L-R Lat (ms) L Amp (mV) R Amp (mV) L-R Amp (%) Site1 Site2 L Vel (m/s) R Vel (m/s) L-R Vel (m/s)  Dp Br Fibular Motor (AntTibialis)  28.3C  Fib Head 2.1   2.8   Poplit Fib Head 69    Poplit 3.7   3.0         Tibial Motor (Abd Hall Brev)  28.3C  Ankle 3.8   20.6   Knee Ankle 47    Knee 12.3   13.4            Waveforms:

## 2024-03-29 ENCOUNTER — Ambulatory Visit (HOSPITAL_BASED_OUTPATIENT_CLINIC_OR_DEPARTMENT_OTHER): Admitting: Anesthesiology

## 2024-03-29 ENCOUNTER — Ambulatory Visit (HOSPITAL_COMMUNITY): Admitting: Anesthesiology

## 2024-03-29 ENCOUNTER — Other Ambulatory Visit: Payer: Self-pay

## 2024-03-29 ENCOUNTER — Ambulatory Visit (HOSPITAL_COMMUNITY)
Admission: RE | Admit: 2024-03-29 | Discharge: 2024-03-29 | Disposition: A | Discharge status: Home / Self Care | Attending: Orthopedic Surgery | Admitting: Orthopedic Surgery

## 2024-03-29 ENCOUNTER — Encounter (HOSPITAL_COMMUNITY)
Admission: RE | Disposition: A | Discharge status: Home / Self Care | Payer: Self-pay | Source: Home / Self Care | Attending: Orthopedic Surgery

## 2024-03-29 DIAGNOSIS — E669 Obesity, unspecified: Secondary | ICD-10-CM | POA: Insufficient documentation

## 2024-03-29 DIAGNOSIS — M19012 Primary osteoarthritis, left shoulder: Secondary | ICD-10-CM

## 2024-03-29 DIAGNOSIS — F418 Other specified anxiety disorders: Secondary | ICD-10-CM

## 2024-03-29 DIAGNOSIS — K219 Gastro-esophageal reflux disease without esophagitis: Secondary | ICD-10-CM | POA: Diagnosis not present

## 2024-03-29 DIAGNOSIS — Z01818 Encounter for other preprocedural examination: Secondary | ICD-10-CM

## 2024-03-29 DIAGNOSIS — I493 Ventricular premature depolarization: Secondary | ICD-10-CM | POA: Diagnosis not present

## 2024-03-29 DIAGNOSIS — Z6836 Body mass index (BMI) 36.0-36.9, adult: Secondary | ICD-10-CM | POA: Diagnosis not present

## 2024-03-29 DIAGNOSIS — Z8261 Family history of arthritis: Secondary | ICD-10-CM | POA: Insufficient documentation

## 2024-03-29 DIAGNOSIS — F17210 Nicotine dependence, cigarettes, uncomplicated: Secondary | ICD-10-CM | POA: Diagnosis not present

## 2024-03-29 DIAGNOSIS — Z79899 Other long term (current) drug therapy: Secondary | ICD-10-CM | POA: Diagnosis not present

## 2024-03-29 DIAGNOSIS — F419 Anxiety disorder, unspecified: Secondary | ICD-10-CM | POA: Insufficient documentation

## 2024-03-29 DIAGNOSIS — R519 Headache, unspecified: Secondary | ICD-10-CM | POA: Diagnosis not present

## 2024-03-29 DIAGNOSIS — M069 Rheumatoid arthritis, unspecified: Secondary | ICD-10-CM | POA: Insufficient documentation

## 2024-03-29 DIAGNOSIS — F319 Bipolar disorder, unspecified: Secondary | ICD-10-CM | POA: Diagnosis not present

## 2024-03-29 DIAGNOSIS — M797 Fibromyalgia: Secondary | ICD-10-CM | POA: Insufficient documentation

## 2024-03-29 HISTORY — PX: RESECTION DISTAL CLAVICAL: SHX5053

## 2024-03-29 HISTORY — PX: POSTERIOR LUMBAR FUSION 2 WITH HARDWARE REMOVAL: SHX7297

## 2024-03-29 SURGERY — ARTHROSCOPY, SHOULDER WITH DEBRIDEMENT
Anesthesia: Regional | Laterality: Left

## 2024-03-29 MED ORDER — MIDAZOLAM HCL 2 MG/2ML IJ SOLN
INTRAMUSCULAR | Status: DC | PRN
  Administered 2024-03-29: 2 mg via INTRAVENOUS

## 2024-03-29 MED ORDER — FENTANYL CITRATE (PF) 100 MCG/2ML IJ SOLN
INTRAMUSCULAR | Status: AC
Start: 2024-03-29 — End: 2024-03-29
  Administered 2024-03-29: 100 ug via INTRAVENOUS
  Filled 2024-03-29: qty 2

## 2024-03-29 MED ORDER — MIDAZOLAM HCL 2 MG/2ML IJ SOLN: 2.0000 mg | Freq: Once | INTRAMUSCULAR | Status: AC

## 2024-03-29 MED ORDER — CHLORHEXIDINE GLUCONATE 0.12 % MT SOLN
15.0000 mL | Freq: Once | OROMUCOSAL | Status: AC
  Filled 2024-03-29: qty 15

## 2024-03-29 MED ORDER — DEXAMETHASONE SODIUM PHOSPHATE 10 MG/ML IJ SOLN
INTRAMUSCULAR | Status: AC
  Filled 2024-03-29: qty 1

## 2024-03-29 MED ORDER — SOD CITRATE-CITRIC ACID 500-334 MG/5ML PO SOLN: 30.0000 mL | Freq: Once | ORAL | Status: AC

## 2024-03-29 MED ORDER — MIDAZOLAM HCL 2 MG/2ML IJ SOLN: 1.0000 mg | INTRAMUSCULAR | Status: DC | PRN

## 2024-03-29 MED ORDER — FAMOTIDINE IN NACL 20-0.9 MG/50ML-% IV SOLN: 20.0000 mg | Freq: Once | INTRAVENOUS | Status: AC

## 2024-03-29 MED ORDER — ROCURONIUM BROMIDE 10 MG/ML (PF) SYRINGE
PREFILLED_SYRINGE | INTRAVENOUS | Status: DC | PRN
  Administered 2024-03-29: 50 mg via INTRAVENOUS

## 2024-03-29 MED ORDER — EPINEPHRINE PF 1 MG/ML IJ SOLN
INTRAMUSCULAR | Status: AC
  Filled 2024-03-29: qty 4

## 2024-03-29 MED ORDER — FENTANYL CITRATE (PF) 100 MCG/2ML IJ SOLN: 100.0000 ug | Freq: Once | INTRAMUSCULAR | Status: AC

## 2024-03-29 MED ORDER — OXYCODONE-ACETAMINOPHEN 10-325 MG PO TABS: 0.5000 | ORAL_TABLET | ORAL | 0 refills | Status: DC | PRN

## 2024-03-29 MED ORDER — BUPIVACAINE HCL (PF) 0.25 % IJ SOLN
INTRAMUSCULAR | Status: AC
  Filled 2024-03-29: qty 30

## 2024-03-29 MED ORDER — MIDAZOLAM HCL 2 MG/2ML IJ SOLN
INTRAMUSCULAR | Status: AC
  Administered 2024-03-29: 2 mg via INTRAVENOUS
  Filled 2024-03-29: qty 2

## 2024-03-29 MED ORDER — FAMOTIDINE IN NACL 20-0.9 MG/50ML-% IV SOLN
INTRAVENOUS | Status: AC
  Administered 2024-03-29: 20 mg via INTRAVENOUS
  Filled 2024-03-29: qty 50

## 2024-03-29 MED ORDER — HYDROMORPHONE HCL 1 MG/ML IJ SOLN
INTRAMUSCULAR | Status: AC
Start: 2024-03-29 — End: ?
  Filled 2024-03-29: qty 1

## 2024-03-29 MED ORDER — LIDOCAINE 2% (20 MG/ML) 5 ML SYRINGE
INTRAMUSCULAR | Status: AC
  Filled 2024-03-29: qty 5

## 2024-03-29 MED ORDER — BUPIVACAINE HCL (PF) 0.5 % IJ SOLN
INTRAMUSCULAR | Status: DC | PRN
  Administered 2024-03-29: 15 mL via PERINEURAL

## 2024-03-29 MED ORDER — SODIUM CHLORIDE 0.9 % IR SOLN
Status: DC | PRN
  Administered 2024-03-29: 6002 mL

## 2024-03-29 MED ORDER — EPINEPHRINE PF 1 MG/ML IJ SOLN
INTRAMUSCULAR | Status: DC | PRN
Start: 2024-03-29 — End: 2024-03-29
  Administered 2024-03-29: .15 mL via INTRAMUSCULAR

## 2024-03-29 MED ORDER — ORAL CARE MOUTH RINSE
15.0000 mL | Freq: Once | OROMUCOSAL | Status: AC
  Administered 2024-03-29: 15 mL via OROMUCOSAL

## 2024-03-29 MED ORDER — SODIUM CHLORIDE 0.9 % IR SOLN
Status: DC | PRN
  Administered 2024-03-29: 3000 mL

## 2024-03-29 MED ORDER — HYDROMORPHONE HCL 1 MG/ML IJ SOLN
0.2500 mg | INTRAMUSCULAR | Status: DC | PRN
  Administered 2024-03-29 (×5): 0.5 mg via INTRAVENOUS

## 2024-03-29 MED ORDER — FENTANYL CITRATE (PF) 250 MCG/5ML IJ SOLN
INTRAMUSCULAR | Status: AC
  Filled 2024-03-29: qty 5

## 2024-03-29 MED ORDER — 0.9 % SODIUM CHLORIDE (POUR BTL) OPTIME
TOPICAL | Status: DC | PRN
  Administered 2024-03-29: 15 mL

## 2024-03-29 MED ORDER — 0.9 % SODIUM CHLORIDE (POUR BTL) OPTIME
TOPICAL | Status: DC | PRN
  Administered 2024-03-29: 1000 mL

## 2024-03-29 MED ORDER — TRANEXAMIC ACID-NACL 1000-0.7 MG/100ML-% IV SOLN
1000.0000 mg | INTRAVENOUS | Status: AC
  Administered 2024-03-29: 1000 mg via INTRAVENOUS
  Filled 2024-03-29: qty 100

## 2024-03-29 MED ORDER — DEXAMETHASONE SODIUM PHOSPHATE 10 MG/ML IJ SOLN
INTRAMUSCULAR | Status: DC | PRN
  Administered 2024-03-29: 10 mg via INTRAVENOUS

## 2024-03-29 MED ORDER — OXYCODONE HCL 5 MG PO TABS: 5.0000 mg | ORAL_TABLET | Freq: Once | ORAL | Status: DC | PRN

## 2024-03-29 MED ORDER — LIDOCAINE 2% (20 MG/ML) 5 ML SYRINGE
INTRAMUSCULAR | Status: DC | PRN
  Administered 2024-03-29: 100 mg via INTRAVENOUS

## 2024-03-29 MED ORDER — HYDROMORPHONE HCL 1 MG/ML IJ SOLN
INTRAMUSCULAR | Status: AC
  Filled 2024-03-29: qty 1

## 2024-03-29 MED ORDER — FENTANYL CITRATE (PF) 250 MCG/5ML IJ SOLN
INTRAMUSCULAR | Status: DC | PRN
  Administered 2024-03-29: 50 ug via INTRAVENOUS

## 2024-03-29 MED ORDER — MIDAZOLAM HCL 2 MG/2ML IJ SOLN
INTRAMUSCULAR | Status: AC
  Filled 2024-03-29: qty 2

## 2024-03-29 MED ORDER — ROCURONIUM BROMIDE 10 MG/ML (PF) SYRINGE
PREFILLED_SYRINGE | INTRAVENOUS | Status: AC
  Filled 2024-03-29: qty 10

## 2024-03-29 MED ORDER — AMISULPRIDE (ANTIEMETIC) 5 MG/2ML IV SOLN
INTRAVENOUS | Status: AC
Start: 2024-03-29 — End: ?
  Filled 2024-03-29: qty 4

## 2024-03-29 MED ORDER — OXYCODONE HCL 5 MG/5ML PO SOLN: 5.0000 mg | Freq: Once | ORAL | Status: DC | PRN

## 2024-03-29 MED ORDER — OXYCODONE HCL 5 MG PO TABS
10.0000 mg | ORAL_TABLET | Freq: Once | ORAL | Status: AC
  Administered 2024-03-29: 10 mg via ORAL

## 2024-03-29 MED ORDER — PROPOFOL 10 MG/ML IV BOLUS
INTRAVENOUS | Status: AC
  Filled 2024-03-29: qty 20

## 2024-03-29 MED ORDER — LACTATED RINGERS IV SOLN: INTRAVENOUS | Status: DC

## 2024-03-29 MED ORDER — OXYCODONE HCL 5 MG PO TABS
ORAL_TABLET | ORAL | Status: AC
Start: 2024-03-29 — End: ?
  Filled 2024-03-29: qty 2

## 2024-03-29 MED ORDER — PROPOFOL 10 MG/ML IV BOLUS
INTRAVENOUS | Status: DC | PRN
  Administered 2024-03-29: 200 mg via INTRAVENOUS

## 2024-03-29 MED ORDER — SUGAMMADEX SODIUM 200 MG/2ML IV SOLN
INTRAVENOUS | Status: DC | PRN
  Administered 2024-03-29: 200 mg via INTRAVENOUS

## 2024-03-29 MED ORDER — BUPIVACAINE LIPOSOME 1.3 % IJ SUSP
INTRAMUSCULAR | Status: DC | PRN
  Administered 2024-03-29: 10 mL via PERINEURAL

## 2024-03-29 MED ORDER — AMISULPRIDE (ANTIEMETIC) 5 MG/2ML IV SOLN
10.0000 mg | Freq: Once | INTRAVENOUS | Status: AC | PRN
  Administered 2024-03-29: 10 mg via INTRAVENOUS

## 2024-03-29 MED ORDER — PHENYLEPHRINE HCL-NACL 20-0.9 MG/250ML-% IV SOLN
INTRAVENOUS | Status: DC | PRN
  Administered 2024-03-29: 40 ug/min via INTRAVENOUS

## 2024-03-29 MED ORDER — HYDROMORPHONE HCL 1 MG/ML IJ SOLN
0.2500 mg | INTRAMUSCULAR | Status: DC | PRN
  Administered 2024-03-29 (×3): 0.5 mg via INTRAVENOUS

## 2024-03-29 MED ORDER — CEFAZOLIN SODIUM-DEXTROSE 2-4 GM/100ML-% IV SOLN
2.0000 g | INTRAVENOUS | Status: AC
  Administered 2024-03-29: 2 g via INTRAVENOUS
  Filled 2024-03-29: qty 100

## 2024-03-29 MED ORDER — PROMETHAZINE HCL 25 MG/ML IJ SOLN: 12.5000 mg | Freq: Once | INTRAMUSCULAR | Status: DC | PRN

## 2024-03-29 MED ORDER — ONDANSETRON HCL 4 MG/2ML IJ SOLN
INTRAMUSCULAR | Status: AC
  Filled 2024-03-29: qty 2

## 2024-03-29 MED ORDER — SOD CITRATE-CITRIC ACID 500-334 MG/5ML PO SOLN
ORAL | Status: AC
  Administered 2024-03-29: 30 mL via ORAL
  Filled 2024-03-29: qty 30

## 2024-03-29 SURGICAL SUPPLY — 53 items
BAG COUNTER SPONGE SURGICOUNT (BAG) ×1 IMPLANT
BLADE EXCALIBUR 4.0X13 (MISCELLANEOUS) ×1 IMPLANT
BLADE SURG 11 STRL SS (BLADE) ×1 IMPLANT
COVER SURGICAL LIGHT HANDLE (MISCELLANEOUS) ×1 IMPLANT
DRAPE INCISE IOBAN 66X45 STRL (DRAPES) ×2 IMPLANT
DRAPE STERI 35X30 U-POUCH (DRAPES) ×1 IMPLANT
DRAPE U-SHAPE 47X51 STRL (DRAPES) ×2 IMPLANT
DRSG TEGADERM 4X4.75 (GAUZE/BANDAGES/DRESSINGS) ×4 IMPLANT
DRSG XEROFORM 1X8 (GAUZE/BANDAGES/DRESSINGS) IMPLANT
DURAPREP 26ML APPLICATOR (WOUND CARE) ×1 IMPLANT
ELECTRODE REM PT RTRN 9FT ADLT (ELECTROSURGICAL) ×1 IMPLANT
FILTER STRAW FLUID ASPIR (MISCELLANEOUS) ×1 IMPLANT
GAUZE PAD ABD 8X10 STRL (GAUZE/BANDAGES/DRESSINGS) ×3 IMPLANT
GAUZE SPONGE 4X4 12PLY STRL (GAUZE/BANDAGES/DRESSINGS) IMPLANT
GAUZE SPONGE 4X4 12PLY STRL LF (GAUZE/BANDAGES/DRESSINGS) ×1 IMPLANT
GAUZE XEROFORM 1X8 LF (GAUZE/BANDAGES/DRESSINGS) ×1 IMPLANT
GLOVE BIOGEL PI IND STRL 8 (GLOVE) ×1 IMPLANT
GLOVE ECLIPSE 8.0 STRL XLNG CF (GLOVE) ×1 IMPLANT
GOWN STRL REUS W/ TWL LRG LVL3 (GOWN DISPOSABLE) ×3 IMPLANT
KIT BASIN OR (CUSTOM PROCEDURE TRAY) ×1 IMPLANT
KIT TURNOVER KIT B (KITS) ×1 IMPLANT
MANIFOLD NEPTUNE II (INSTRUMENTS) ×1 IMPLANT
NDL HYPO 25X1 1.5 SAFETY (NEEDLE) ×1 IMPLANT
NDL SCORPION MULTI FIRE (NEEDLE) IMPLANT
NDL SPNL 18GX3.5 QUINCKE PK (NEEDLE) ×1 IMPLANT
NDL SUT 6 .5 CRC .975X.05 MAYO (NEEDLE) IMPLANT
NEEDLE HYPO 25X1 1.5 SAFETY (NEEDLE) ×1 IMPLANT
NEEDLE SCORPION MULTI FIRE (NEEDLE) IMPLANT
NEEDLE SPNL 18GX3.5 QUINCKE PK (NEEDLE) ×1 IMPLANT
NS IRRIG 1000ML POUR BTL (IV SOLUTION) ×1 IMPLANT
PACK SHOULDER (CUSTOM PROCEDURE TRAY) ×1 IMPLANT
PAD ARMBOARD POSITIONER FOAM (MISCELLANEOUS) ×2 IMPLANT
PROBE APOLLO 90XL (SURGICAL WAND) ×1 IMPLANT
RESTRAINT HEAD UNIVERSAL NS (MISCELLANEOUS) ×1 IMPLANT
SLING ARM FOAM STRAP LRG (SOFTGOODS) IMPLANT
SLING ARM IMMOBILIZER MED (SOFTGOODS) IMPLANT
SPONGE T-LAP 4X18 ~~LOC~~+RFID (SPONGE) ×2 IMPLANT
STRIP CLOSURE SKIN 1/2X4 (GAUZE/BANDAGES/DRESSINGS) ×1 IMPLANT
SUCTION TUBE FRAZIER 10FR DISP (SUCTIONS) ×1 IMPLANT
SUT ETHILON 3 0 PS 1 (SUTURE) ×1 IMPLANT
SUT MNCRL AB 3-0 PS2 18 (SUTURE) ×1 IMPLANT
SUT VIC AB 0 CT1 27XBRD ANBCTR (SUTURE) ×1 IMPLANT
SUT VIC AB 1 CT1 27XBRD ANBCTR (SUTURE) ×1 IMPLANT
SUT VIC AB 2-0 CT2 27 (SUTURE) ×1 IMPLANT
SUT VICRYL 0 UR6 27IN ABS (SUTURE) IMPLANT
SUTURE FIBERWR #2 38 T-5 BLUE (SUTURE) IMPLANT
SYR 20ML LL LF (SYRINGE) ×2 IMPLANT
SYR 3ML LL SCALE MARK (SYRINGE) ×1 IMPLANT
SYR TB 1ML LUER SLIP (SYRINGE) ×1 IMPLANT
TOWEL GREEN STERILE (TOWEL DISPOSABLE) ×1 IMPLANT
TOWEL GREEN STERILE FF (TOWEL DISPOSABLE) ×1 IMPLANT
TUBING ARTHROSCOPY IRRIG 16FT (MISCELLANEOUS) ×1 IMPLANT
WATER STERILE IRR 1000ML POUR (IV SOLUTION) ×1 IMPLANT

## 2024-03-29 NOTE — Anesthesia Procedure Notes (Signed)
 Anesthesia Regional Block: Interscalene brachial plexus block   Pre-Anesthetic Checklist: , timeout performed,  Correct Patient, Correct Site, Correct Laterality,  Correct Procedure, Correct Position, site marked,  Risks and benefits discussed,  Surgical consent,  Pre-op evaluation,  At surgeon's request and post-op pain management  Laterality: Left  Prep: chloraprep       Needles:  Injection technique: Single-shot      Needle Length: 10cm    Needle insertion depth: 7 cm   Additional Needles:   Procedures:,,,, ultrasound used (permanent image in chart),,   Motor weakness within 5 minutes.  Narrative:  Start time: 03/29/2024 8:37 AM End time: 03/29/2024 8:42 AM Injection made incrementally with aspirations every 5 mL.  Performed by: Personally  Anesthesiologist: Tura Gaines, MD  Additional Notes: Timeout performed. Patient sedated. Relevant anatomy ID'd using US . Incremental 2-5ml injection of LA with frequent aspiration. Patient tolerated procedure well.

## 2024-03-29 NOTE — Anesthesia Procedure Notes (Signed)
 Procedure Name: Intubation Date/Time: 03/29/2024 9:48 AM  Performed by: Leandro Proffer, CRNAPre-anesthesia Checklist: Patient identified, Emergency Drugs available, Suction available and Patient being monitored Patient Re-evaluated:Patient Re-evaluated prior to induction Oxygen Delivery Method: Circle system utilized Preoxygenation: Pre-oxygenation with 100% oxygen Induction Type: IV induction Ventilation: Mask ventilation without difficulty Laryngoscope Size: Glidescope and 4 Grade View: Grade I Tube type: Oral Tube size: 7.0 mm Number of attempts: 1 Airway Equipment and Method: Stylet and Oral airway Placement Confirmation: ETT inserted through vocal cords under direct vision, positive ETCO2 and breath sounds checked- equal and bilateral Secured at: 21 cm Tube secured with: Tape Dental Injury: Teeth and Oropharynx as per pre-operative assessment

## 2024-03-29 NOTE — Anesthesia Postprocedure Evaluation (Signed)
 Anesthesia Post Note  Patient: Ayjah Show  Procedure(s) Performed: ARTHROSCOPY, SHOULDER WITH DEBRIDEMENT (Left) EXCISION, CLAVICLE, DISTAL, OPEN (Left)     Patient location during evaluation: PACU Anesthesia Type: General Level of consciousness: awake and alert and oriented Pain management: pain level controlled Vital Signs Assessment: post-procedure vital signs reviewed and stable Respiratory status: spontaneous breathing, nonlabored ventilation and respiratory function stable Cardiovascular status: blood pressure returned to baseline and stable Postop Assessment: no apparent nausea or vomiting Anesthetic complications: no   No notable events documented.  Last Vitals:  Vitals:   03/29/24 1115 03/29/24 1130  BP: 115/72 118/89  Pulse: 89 74  Resp: (!) 21 18  Temp:    SpO2: 94% 93%    Last Pain:  Vitals:   03/29/24 1124  TempSrc:   PainSc: 10-Worst pain ever                 Bernadetta Roell A.

## 2024-03-29 NOTE — Transfer of Care (Signed)
 Immediate Anesthesia Transfer of Care Note  Patient: Gina [name]  Procedure(s) Performed: ARTHROSCOPY, SHOULDER WITH DEBRIDEMENT (Left) EXCISION, CLAVICLE, DISTAL, OPEN (Left)  Patient Location: PACU  Anesthesia Type:General  Level of Consciousness: awake, alert , and oriented  Airway & Oxygen Therapy: Patient Spontanous Breathing and Patient connected to nasal cannula oxygen  Post-op Assessment: Report given to RN and Post -op Vital signs reviewed and stable  Post vital signs: Reviewed and stable  Last Vitals:  Vitals Value Taken Time  BP 87/74 03/29/24 1101  Temp    Pulse 96 03/29/24 1105  Resp 30 03/29/24 1105  SpO2 96 % 03/29/24 1105  Vitals shown include unfiled device data.  Last Pain:  Vitals:   03/29/24 0818  TempSrc:   PainSc: 6       Patients Stated Pain Goal: 2 (03/29/24 0818)  Complications: No notable events documented.

## 2024-03-29 NOTE — H&P (Signed)
 Gina [name] is an 48 y.o. female.   Chief Complaint: left shoulder pain HPI: Gina [name] is a 48 y.o. female who presents reporting left shoulder pain for 6 months..  Patient underwent left shoulder AC joint injection 12/02/2023 which did give her very good relief for 5 days.  The pain is back now to where it was before.  Has tried Tylenol  without relief.  Has done well with her right shoulder surgery.  Hard for her to sleep on the left-hand side.  She does have some popping.  She is allergic to tramadol .  Had an MRI scan in December which did show some edema in that distal clavicle.. Interval patella fracture healing well.  Past Medical History:  Diagnosis Date   ABDOMINAL WALL HERNIA 02/27/2010   Abnormal Pap smear    ALLERGIC RHINITIS 06/02/2007   ANOREXIA, CHRONIC 08/07/2008   ANXIETY 09/15/2010   Arthritis    BACK PAIN, THORACIC REGION 07/07/2007   Bipolar disorder (HCC)    CERVICAL RADICULOPATHY 12/11/2008   Complication of anesthesia    Pateint reprots a high tolerance   Complication of anesthesia    pt states she has not had her voice completely back since surgery in Nov. 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   Eating disorder    ENDOMETRIOSIS 12/16/2009   FATIGUE 06/11/2010   Fibromyalgia    GERD 10/23/2009   Heart palpitations    HERPES SIMPLEX INFECTION 06/11/2010   LENTIGO 04/23/2009   LUNG NODULE 06/17/2010   Narcotic abuse, continuous (HCC)    pt denies   Ovarian cyst    Palpitations 10/23/2009   Panic disorder    Rheumatoid arthritis(714.0)    Stricture and stenosis of esophagus 05/13/2010   TOBACCO ABUSE 08/07/2008   Urinary tract infection    UTI (urinary tract infection)     Past Surgical History:  Procedure Laterality Date   ABDOMINAL SURGERY     ABDOMINAL WALL MESH  REMOVAL     ABLATION ON ENDOMETRIOSIS     CESAREAN SECTION     x2     CYSTOSCOPY N/A 11/22/2023   Procedure: CYSTOSCOPY;  Surgeon: Roxane Copp, MD;  Location: [facility];  Service: Urology;  Laterality: N/A;   DILATION AND CURETTAGE OF UTERUS     x1    ENDOMETRIAL ABLATION     esophageal dilatation x 4     GUM SURGERY     HERNIA REPAIR     X3   PARTIAL HYSTERECTOMY     SHOULDER SURGERY Right 09/2023    Family History  Problem Relation Age of Onset   Depression Mother    Arthritis Mother    Hyperlipidemia Father    Hypertension Father    Social History:  reports that she has been smoking cigarettes. She has never used smokeless tobacco. She reports that she does not drink alcohol and does not use drugs.  Allergies:  Allergies  Allergen Reactions   Lamictal [Lamotrigine] Anaphylaxis and Other (See Comments)    STEVEN JOHNSON'S SYNDROME   Maxalt  [Rizatriptan ] Anaphylaxis   Morphine  And Codeine  Other (See Comments)    Makes pt "very mean" "I get mean" Makes pt "very mean" Makes pt "very mean"   Nicotine Swelling and Palpitations    Other reaction(s): Respiratory Distress (ALLERGY/intolerance), Swelling (ALLERGY/intolerance), Tachycardia / Palpitations  (intolerance) Patches, lozenges Patches caused palpations lozenges throat swelled   Zofran  Anaphylaxis  Chantix  [Varenicline  Tartrate] Other (See Comments)     Diaphoresis, sweating.  Night terrors.  "went crazy"   Ciprofloxacin  Diarrhea and Nausea And Vomiting   Codeine  Nausea And Vomiting   Haldol  [Haloperidol ] Itching, Rash and Other (See Comments)    FLUSHING   Ibuprofen Nausea And Vomiting   Lidoderm  [Lidocaine ] Other (See Comments)    DOESN'T WORK FOR PATIENT   Skelaxin [Metaxalone] Other (See Comments)    Pt does not remember reaction, REAL BAD REACTION   Sulfa Antibiotics Diarrhea, Other (See Comments) and Nausea And Vomiting    GI PAINS ALSO GI PAINS ALSO    Sulfasalazine Diarrhea    GI PAINS   Toradol  [Ketorolac  Tromethamine ] Nausea And Vomiting   Ambien [Zolpidem] Other  (See Comments)    Mental status changes   Doxycycline      Other Reaction(s): GI Intolerance   Duloxetine Other (See Comments)    Unknown reaction   Ingrezza [Valbenazine Tosylate] Hives   Lyrica [Pregabalin] Other (See Comments)    Unknown reaction type   Methadone     seizures   Neurontin  [Gabapentin ] Nausea And Vomiting   Other     Pt states her body does not react well to "the new type of prep" must use betadine   Prednisone  Other (See Comments)    Interaction to other medications   Amoxicillin Rash    Has patient had a PCN reaction causing immediate rash, facial/tongue/throat swelling, SOB or lightheadedness with hypotension: No Has patient had a PCN reaction causing severe rash involving mucus membranes or skin necrosis: No Has patient had a PCN reaction that required hospitalization: No Has patient had a PCN reaction occurring within the last 10 years: No If all of the above answers are "NO", then may proceed with Cephalosporin use.    Aripiprazole Anxiety    hallucinations   Chlorhexidine  Itching and Rash   Penicillins Rash    Other reaction(s): Urticaria / Hives (ALLERGY) Has patient had a PCN reaction causing immediate rash, facial/tongue/throat swelling, SOB or lightheadedness with hypotension: No Has patient had a PCN reaction causing severe rash involving mucus membranes or skin necrosis: No Has patient had a PCN reaction that required hospitalization: No Has patient had a PCN reaction occurring within the last 10 years: No If all of the above answers are "NO", then may proceed with Cephalosporin use.   Tramadol  Rash    Medications Prior to Admission  Medication Sig Dispense Refill   calcium carbonate (TUMS - DOSED IN MG ELEMENTAL CALCIUM) 500 MG chewable tablet Chew 1 tablet by mouth 4 (four) times daily as needed for indigestion or heartburn.     Cholecalciferol (VITAMIN D3 PO) Take 2 each by mouth daily. Gummy     ELESTRIN 0.52 MG/0.87 GM (0.06%) GEL Apply 1  Application topically every evening.     escitalopram  (LEXAPRO ) 20 MG tablet Take 20 mg by mouth at bedtime.     gabapentin  (NEURONTIN ) 100 MG capsule Take 1 capsule (100 mg total) by mouth 3 (three) times daily. (Patient not taking: Reported on 03/16/2024) 30 capsule 0   HYDROcodone -acetaminophen  (NORCO/VICODIN) 5-325 MG tablet Take 1 tablet by mouth every 12 (twelve) hours as needed for moderate pain (pain score 4-6). (Patient taking differently: Take 1 tablet by mouth in the morning and at bedtime.) 30 tablet 0   LORazepam  (ATIVAN ) 1 MG tablet Take 1 mg by mouth every 8 (eight) hours as needed for anxiety.     lurasidone  (LATUDA ) 40 MG TABS tablet  Take 40 mg by mouth every evening.      Results for orders placed or performed during the hospital encounter of 03/27/24 (from the past 48 hours)  CBC     Status: None   Collection Time: 03/27/24  1:11 PM  Result Value Ref Range   WBC 8.9 4.0 - 10.5 K/uL   RBC 4.45 3.87 - 5.11 MIL/uL   Hemoglobin 14.6 12.0 - 15.0 g/dL   HCT 40.9 81.1 - 91.4 %   MCV 95.1 80.0 - 100.0 fL   MCH 32.8 26.0 - 34.0 pg   MCHC 34.5 30.0 - 36.0 g/dL   RDW 78.2 95.6 - 21.3 %   Platelets 241 150 - 400 K/uL   nRBC 0.0 0.0 - 0.2 %    Comment: Performed at [facility], [address], [place], [place] [address]  Basic metabolic panel     Status: Abnormal   Collection Time: 03/27/24  1:11 PM  Result Value Ref Range   Sodium 137 135 - 145 mmol/L   Potassium 4.6 3.5 - 5.1 mmol/L   Chloride 105 98 - 111 mmol/L   CO2 26 22 - 32 mmol/L   Glucose, Bld 90 70 - 99 mg/dL    Comment: Glucose reference range applies only to samples taken after fasting for at least 8 hours.   BUN 11 6 - 20 mg/dL   Creatinine, Ser 8.46 0.44 - 1.00 mg/dL   Calcium 8.6 (L) 8.9 - 10.3 mg/dL   GFR, Estimated >96 >29 mL/min    Comment: (NOTE) Calculated using the CKD-EPI Creatinine Equation (2021)    Anion gap 6 5 - 15    Comment: Performed at [facility], [address],  [place], [place] [address]   No results found.  Review of Systems  Musculoskeletal:  Positive for arthralgias.  All other systems reviewed and are negative.   Last menstrual period 01/01/2004. Physical Exam Vitals reviewed.  HENT:     Head: Normocephalic.     Nose: Nose normal.     Mouth/Throat:     Mouth: Mucous membranes are moist.  Eyes:     Pupils: Pupils are equal, round, and reactive to light.  Cardiovascular:     Rate and Rhythm: Normal rate.     Pulses: Normal pulses.  Pulmonary:     Effort: Pulmonary effort is normal.  Abdominal:     General: Abdomen is flat.  Musculoskeletal:     Cervical back: Normal range of motion.  Skin:    General: Skin is warm.     Capillary Refill: Capillary refill takes less than 2 seconds.  Neurological:     General: No focal deficit present.     Mental Status: She is alert.  Psychiatric:        Mood and Affect: Mood normal.      Ortho exam unchanged from prior visit.  She does have discrete AC joint tenderness with pain with crossarm adduction on that left-hand side.  Rotator cuff strength is intact.  No restriction of external rotation left shoulder versus right.  No coarse grinding or crepitus is present.  O'Brien's testing is equivocal on the left.  Assessment/Plan Impression is symptomatic left shoulder AC joint arthritis.  Good result with AC joint injection.  Symptoms have continued.  Her left knee patella fracture is healing well.  Crutch use may have exacerbated her left shoulder symptoms.  Rotator cuff looks good on MRI scan.  Plan at this time is  arthroscopy with arthroscopic distal clavicle excision.  The risk and benefits are discussed with the patient including not limited to infection nerve vessel damage incomplete pain relief as well as shoulder stiffness.  The nature of the recovery was also discussed.  All questions answered.  Marykay Snipes, MD 03/29/2024, 7:26 AM

## 2024-03-29 NOTE — Brief Op Note (Addendum)
   03/29/2024  11:04 AM  PATIENT:  Gina [name]  48 y.o. female  PRE-OPERATIVE DIAGNOSIS:  left shoulder osteoarthritis  POST-OPERATIVE DIAGNOSIS:  left shoulder osteoarthritis  PROCEDURE:  Procedure(s): ARTHROSCOPY, SHOULDER WITH DEBRIDEMENT Arthroscopic distal clavicle EXCISION, SURGEON:  Surgeon(s): Rozelle Corning, Maricela Shoe, MD  ASSISTANT: Magnant PA  ANESTHESIA:   General  EBL: 5 ml    Total I/O In: 800 [I.V.:700; IV Piggyback:100] Out: 5 [Blood:5]  BLOOD ADMINISTERED: none  DRAINS: none   LOCAL MEDICATIONS USED:  none  SPECIMEN:  No Specimen  COUNTS:  YES  TOURNIQUET:  * No tourniquets in log *  DICTATION: .Other Dictation: Dictation Number get available patient more  Diagnosis left shoulder AC joint arthritis postop diagnosis same procedure left shoulder arthroscopy with arthroscopic distal clavicle excision #10 scheduling assistant Megan PA indications [name] is a 48 year old female with left shoulder pain refractory to nonoperative management.  She presents for management ofrisk benefits proceeding with epidural block after nonemergent monitoring anesthesia  Expected mL follow-up patient placed in the beachchair position with the head in neutral position shoulder arm and hand prescribed alcohol Betadine and prepped with DuraPrep solution and draped in sterile manner timeout was called posterior portals were created 2 cm inferior and medial to the pressure on margin acromion diagnostic arthroscopy was performed glenohumeral joint surfaces intact Superior labrum intact anterior-inferior and posterior inferior labrum intact.  Rotator cuff intact.  No synovitis in the rotator interval.  This correlated with her preop shoulder examination range of motion of 70/115/180.  Scope placed in the subacromial space.  Lateral portal created debridement of bursitis performed with the ArthroCare wand distal clavicle visualized anterior portal created in line with the distal  clavicle.  Using a bur and shaver the distal 9 mm of the clavicle distal clavicle was excised maintaining the superior and posterior ligaments.  Thorough irrigation of the joint was performed bursectomy completed we did do a small acromioplasty in order to facilitate initial burring of the clavicle clavicle.  The instruments were removed portals were closed using 3-0 Vicryl 3-0 nylon impervious dressings and a sling applied patient tolerated the procedure well without any complications transferred to her room in stable condition [name]'s assistance was required for retraction opening closing mobilization of the arm and pump management during the procedure.[id number] she is really hurting and she  PLAN OF CARE: Discharge to home after PACU  PATIENT DISPOSITION:  PACU - hemodynamically stable

## 2024-03-30 ENCOUNTER — Encounter (HOSPITAL_COMMUNITY): Payer: Self-pay | Admitting: Orthopedic Surgery

## 2024-03-30 NOTE — Op Note (Signed)
 NAME: Gina [name], MURO MEDICAL RECORD NO: [id number] ACCOUNT NO: [id number] DATE OF BIRTH: May 15, 1976 FACILITY: [facility] LOCATION: [facility] PHYSICIAN: Gloriann Larger. Rozelle Corning, MD  Operative Report   DATE OF PROCEDURE: 03/29/2024  PREOPERATIVE DIAGNOSIS:  Left shoulder AC joint arthritis.  POSTOPERATIVE DIAGNOSIS:  Left shoulder AC joint arthritis.  PROCEDURE:  Left shoulder arthroscopy with arthroscopic distal clavicle excision.  SURGEON:  Gloriann Larger. Rozelle Corning, MD  ASSISTANT:  Van Gelinas Magnet, PA  INDICATIONS:  The patient is a 48 year old female with left shoulder pain refractory to nonoperative management.  She presents for operative management.  DESCRIPTION OF PROCEDURE:  After explaining the risks and benefits of the procedure in detail, the patient was brought to the operating room where general endotracheal anesthesia was induced.  Perioperative antibiotics were administered.  Timeout was  called.  The patient was placed in the beach chair position with the head in neutral position.  The shoulder, arm, and hand were prescrubbed with alcohol and Betadine and then prepped with DuraPrep solution and draped in a sterile manner.  Timeout was  called.  A posterior portal was created 2 cm inferior and medial to the posterolateral margin of the acromion.  Diagnostic arthroscopy was performed.  Glenohumeral joint surfaces intact.  Superior labrum intact.  Anterior, inferior, and posterior  inferior labrum intact.  Rotator cuff intact.  No synovitis in the rotator interval.  This correlated with her preop shoulder examination.  Range of motion of 70/115/180.  Scope placed in the subacromial space.  A lateral portal was created.  Debridement  of bursitis performed with the ArthroCare wand.  Distal clavicle visualized.  An anterior portal created in line with the distal clavicle.  Using a burr and shaver, the distal 9 mm of the distal clavicle was excised, maintaining the superior and  posterior ligaments.  Thorough  irrigation of the joint was performed.  Bursectomy was completed.  We did do a small acromioplasty in order to facilitate initial burring of the clavicle.  The instruments were removed.  Portals were closed using 3-0 Vicryl  and 3-0 nylon.  Impervious dressings and a sling applied.  The patient tolerated the procedure well without immediate complications.  The patient was transferred to the recovery room in stable condition.   [name]'s assistance was required for retraction, opening, closing, mobilization of the arm, and pump management during the procedure.   MUK D: 03/29/2024 11:08:21 am T: 03/30/2024 12:21:00 am  JOB: [id number]/ [id number]

## 2024-04-01 ENCOUNTER — Telehealth: Payer: Self-pay | Admitting: Orthopedic Surgery

## 2024-04-02 ENCOUNTER — Encounter: Payer: Self-pay | Admitting: Physical Medicine and Rehabilitation

## 2024-04-02 NOTE — Progress Notes (Signed)
 Gina [name] - 48 y.o. female MRN [id number]  Date of birth: [birth year]  Office Visit Note: Visit Date: 03/27/2024 PCP: No primary care provider on file. Referred by: Casilda Clayman, PA-C  Subjective: Chief Complaint  Patient presents with   Left Foot - Pain, Numbness   HPI: Gina [name] is a 48 y.o. female who comes in today. at the request of Prentis Brock, PA-C for evaluation and management of chronic, worsening and severe pain, numbness and tingling in the Left lower extremities.  Per the patient in the chart she sustained a fracture with left knee problems on 317 of this year.  Since that time she has had continued knee pain with paresthesias numbness and tingling in the leg to the top of the foot sparing the bottom of the foot.  She has noticed some weakness.  No right-sided complaints.  No other specific injury.  Nothing from the hips or buttock down the legs.  No history of polyneuropathy.  She reports that since she saw [facility] last time she has had new onset symptoms where her whole left hemibody had pain numbness and tingling.  She went to the emergency room and they told her that she had "nerve damage ".  They did not see any evidence of stroke.  She is scheduled for upcoming shoulder surgery by Dr. Rozelle Corning.  Her case is complicated by extensive depression anxiety and bipolar disorder.  She has 29 drug intolerances.   I spent more than 30 minutes speaking face-to-face with the patient with 50% of the time in counseling and discussing coordination of care.      Review of Systems  Musculoskeletal:  Positive for back pain, joint pain and neck pain.  Neurological:  Positive for tingling, weakness and headaches.  All other systems reviewed and are negative.  Otherwise per HPI.  Assessment & Plan: Visit Diagnoses:    ICD-10-CM   1. Paresthesia of skin  R20.2 NCV with EMG (electromyography)    2. Chronic pain of left knee  M25.562    G89.29     3. Weakness of left  foot  R29.898        Plan: Impression: Clinically difficult symptomatology to fit 21 diagnoses but could be fibular nerve neuropathy versus radiculopathy versus central pain sensitization syndrome such as fibromyalgia.  Essentially NORMAL electrodiagnostic study of the left lower limb.  There is no significant electrodiagnostic evidence of nerve entrapment, lumbosacral plexopathy or lumbar radiculopathy.    As you know, purely sensory or demyelinating radiculopathies and chemical radiculitis may not be detected with this particular electrodiagnostic study. **This electrodiagnostic study cannot rule out small fiber polyneuropathy and dysesthesias from central pain syndromes such as stroke or central pain sensitization syndromes such as fibromyalgia.  Myotomal referral pain from trigger points is also not excluded.  Recommendations: 1.  Follow-up with referring physician. 2.  Continue current management of symptoms.  Meds & Orders: No orders of the defined types were placed in this encounter.   Orders Placed This Encounter  Procedures   NCV with EMG (electromyography)    Follow-up: Return for [employer], PA-C.   Procedures: No procedures performed  EMG & NCV Findings: All nerve conduction studies (as indicated in the following tables) were within normal limits.    All examined muscles (as indicated in the following table) showed no evidence of electrical instability.    Impression: Essentially NORMAL electrodiagnostic study of the left lower limb.  There is no significant electrodiagnostic evidence of  nerve entrapment, lumbosacral plexopathy or lumbar radiculopathy.    As you know, purely sensory or demyelinating radiculopathies and chemical radiculitis may not be detected with this particular electrodiagnostic study. **This electrodiagnostic study cannot rule out small fiber polyneuropathy and dysesthesias from central pain syndromes such as stroke or central pain sensitization  syndromes such as fibromyalgia.  Myotomal referral pain from trigger points is also not excluded.  Recommendations: 1.  Follow-up with referring physician. 2.  Continue current management of symptoms.  ___________________________ Collin Deal FAAPMR Board Certified, American Board of Physical Medicine and Rehabilitation    Nerve Conduction Studies Anti Sensory Summary Table   Stim Site NR Peak (ms) Norm Peak (ms) P-T Amp (V) Norm P-T Amp Site1 Site2 Delta-P (ms) Dist (cm) Vel (m/s) Norm Vel (m/s)  Left Saphenous Anti Sensory (Ant Med Mall)  28.2C  14cm    3.5 <4.4 19.2 >2 14cm Ant Med Mall 3.5 0.0  >32  Left Sup Fibular Anti Sensory (Ant Lat Mall)  28.2C  14 cm    3.8 <4.4 19.2 >5.0 14 cm Ant Lat Mall 3.8 14.0 37 >32  Left Sural Anti Sensory (Lat Mall)  28.1C  Calf    3.7 <4.0 23.5 >5.0 Calf Lat Mall 3.7 14.0 38 >35   Motor Summary Table   Stim Site NR Onset (ms) Norm Onset (ms) O-P Amp (mV) Norm O-P Amp Site1 Site2 Delta-0 (ms) Dist (cm) Vel (m/s) Norm Vel (m/s)  Left Dp Br Fibular Motor (AntTibialis)  28.3C  Fib Head    2.1 <4.2 2.8  Poplit Fib Head 1.6 11.0 69 >40.5  Poplit    3.7 <5.7 3.0         Left Tibial Motor (Abd Hall Brev)  28.3C  Ankle    3.8 <6.1 20.6 >3.0 Knee Ankle 8.5 40.0 47 >35  Knee    12.3  13.4          EMG   Side Muscle Nerve Root Ins Act Fibs Psw Amp Dur Poly Recrt Int Deatra Face Comment  Left AntTibialis Dp Br Peron L4-5 Nml Nml Nml Nml Nml 0 Nml Nml   Left Fibularis Longus  Sup Br Peron L5-S1 Nml Nml Nml Nml Nml 0 Nml Nml   Left MedGastroc Tibial S1-2 Nml Nml Nml Nml Nml 0 Nml Nml   Left VastusMed Femoral L2-4 Nml Nml Nml Nml Nml 0 Nml Nml   Left BicepsFemS Sciatic L5-S1 Nml Nml Nml Nml Nml 0 Nml Nml     Nerve Conduction Studies Anti Sensory Left/Right Comparison   Stim Site L Lat (ms) R Lat (ms) L-R Lat (ms) L Amp (V) R Amp (V) L-R Amp (%) Site1 Site2 L Vel (m/s) R Vel (m/s) L-R Vel (m/s)  Saphenous Anti Sensory (Ant Med Mall)  28.2C  14cm  3.5   19.2   14cm Ant Med Mall     Sup Fibular Anti Sensory (Ant Lat Mall)  28.2C  14 cm 3.8   19.2   14 cm Ant Lat Mall 37    Sural Anti Sensory (Lat Mall)  28.1C  Calf 3.7   23.5   Calf Lat Mall 38     Motor Left/Right Comparison   Stim Site L Lat (ms) R Lat (ms) L-R Lat (ms) L Amp (mV) R Amp (mV) L-R Amp (%) Site1 Site2 L Vel (m/s) R Vel (m/s) L-R Vel (m/s)  Dp Br Fibular Motor (AntTibialis)  28.3C  Fib Head 2.1   2.8   Poplit Fib Head 69  Poplit 3.7   3.0         Tibial Motor (Abd Hall Brev)  28.3C  Ankle 3.8   20.6   Knee Ankle 47    Knee 12.3   13.4            Waveforms:            Clinical History: Narrative & Impression CLINICAL DATA:  Low back pain, prior surgery, new symptoms. Ongoing low back pain for 2 months. No known injury. No prior surgery to area.   EXAM: MRI LUMBAR SPINE WITHOUT AND WITH CONTRAST   TECHNIQUE: Multiplanar and multiecho pulse sequences of the lumbar spine were obtained without and with intravenous contrast.   CONTRAST:  10mL GADAVIST  GADOBUTROL  1 MMOL/ML IV SOLN   COMPARISON:  Lumbar MRI 04/02/2018. CT of the abdomen and pelvis 06/15/2023. Lumbar spine radiographs 09/18/2018.   FINDINGS: Segmentation: Conventional anatomy assumed, with the last open disc space designated L5-S1.Concordant with prior imaging.   Alignment: Mild convex right scoliosis. No focal angulation or listhesis.   Vertebrae: No worrisome osseous lesion, acute fracture or pars defect. Chronic Schmorl's node in the inferior endplate of L1. No evidence of discitis or osteomyelitis. No obvious postsurgical changes in the lumbar spine.   Conus medullaris: Extends to the L1-2 level and appears normal. No abnormal intradural enhancement.   Paraspinal and other soft tissues: No significant paraspinal findings.   Disc levels:   Sagittal images demonstrate no significant disc space findings within the visualized lower thoracic spine.   L1-2: Stable loss  of disc height with disc bulging and endplate osteophytes. No spinal stenosis or nerve root encroachment.   L2-3: Normal interspace.   L3-4: Normal interspace.   L4-5: Normal interspace.   L5-S1: Normal interspace.   IMPRESSION: 1. No acute findings or explanation for the patient's symptoms. 2. Stable spondylosis at L1-2 without resulting spinal stenosis or nerve root encroachment.     Electronically Signed   By: Elmon Hagedorn M.D.   On: 07/29/2023 18:36   She reports that she has been smoking cigarettes. She has never used smokeless tobacco. No results for input(s): "HGBA1C", "LABURIC" in the last 8760 hours.  Objective:  VS:  HT:    WT:   BMI:     BP:   HR: bpm  TEMP: ( )  RESP:  Physical Exam Vitals and nursing note reviewed.  Constitutional:      General: She is not in acute distress.    Appearance: Normal appearance. She is well-developed. She is obese.  HENT:     Head: Normocephalic and atraumatic.     Nose: Nose normal.     Mouth/Throat:     Mouth: Mucous membranes are moist.     Pharynx: Oropharynx is clear.  Eyes:     Conjunctiva/sclera: Conjunctivae normal.     Pupils: Pupils are equal, round, and reactive to light.  Cardiovascular:     Rate and Rhythm: Regular rhythm.  Pulmonary:     Effort: Pulmonary effort is normal. No respiratory distress.  Abdominal:     General: There is no distension.     Palpations: Abdomen is soft.     Tenderness: There is no guarding.  Musculoskeletal:        General: Tenderness present. No swelling or deformity.     Cervical back: Normal range of motion and neck supple.     Right lower leg: No edema.     Left lower leg: No edema.  Comments: Examination of both lower limbs shows normal bulk and tone of the calf musculature bilaterally.  There is no swelling or allodynia or sweating noted.  There is no discoloration.  She does have mild atrophy of the EDB musculature bilaterally.  On exam today she appears to have good  strength with dorsiflexion plantarflexion EHL.  She is somewhat apprehensive for active muscle firing.  Skin:    General: Skin is warm and dry.     Findings: No erythema or rash.  Neurological:     General: No focal deficit present.     Mental Status: She is alert and oriented to person, place, and time.     Cranial Nerves: No cranial nerve deficit.     Sensory: No sensory deficit.     Motor: No weakness or abnormal muscle tone.     Coordination: Coordination normal.     Gait: Gait abnormal.  Psychiatric:        Mood and Affect: Mood normal.        Behavior: Behavior normal.        Thought Content: Thought content normal.     Ortho Exam  Imaging: No results found.  Past Medical/Family/Surgical/Social History: Medications & Allergies reviewed per EMR, new medications updated. Patient Active Problem List   Diagnosis Date Noted   Chronic pain in left shoulder 11/01/2023   Calcific supraspinatus tendonitis 10/03/2023   Bursitis of right shoulder 10/03/2023   Arthritis of right acromioclavicular joint 10/03/2023   S/P arthroscopy of right shoulder 09/27/2023   Persistent headaches 01/18/2023   Blurry vision 01/18/2023   Weight gain 01/18/2023   SBO (small bowel obstruction) (HCC) 09/13/2022   Nausea and vomiting 09/11/2022   Partial small bowel obstruction (HCC) 09/10/2022   Piriformis syndrome of both sides 05/31/2019   History of bipolar disorder 04/18/2019   Tardive dyskinesia 04/18/2019   Urticaria due to drug allergy 04/18/2019   Lumbar spondylosis 02/05/2019   Bipolar affective disorder, current episode mixed (HCC) 01/02/2019   Arthritis 01/02/2019   Cigarette nicotine dependence without complication 01/02/2019   Bilateral temporomandibular joint pain 10/02/2018   Referred otalgia, bilateral 10/02/2018   Paresthesias 03/09/2018   Chronic migraine without aura, intractable, with status migrainosus 04/22/2016   Bipolar 1 disorder, mixed, moderate (HCC) 07/15/2015     Class: Chronic   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   Sciatica 01/09/2015   Reactive hypoglycemia 12/17/2013   Musculoskeletal malfunction arising from mental factors 04/04/2013   Incisional hernia 11/15/2012   Other postprocedural status(V45.89) 11/15/2012   S/P hernia surgery 11/15/2012   Abdominal pain 11/11/2012   Chronic pain syndrome 11/11/2012   Disorder of sacrum 11/11/2012   Chronic pain 12/21/2011   Heartburn 10/04/2011   DYSPHAGIA 10/04/2011   Depression with anxiety 09/15/2010   CONSTIPATION 09/15/2010   LUNG NODULE 06/17/2010   Herpes simplex virus (HSV) infection 06/11/2010   FATIGUE 06/11/2010   STRICTURE AND STENOSIS OF ESOPHAGUS 05/13/2010   ABDOMINAL WALL HERNIA 02/27/2010   ENDOMETRIOSIS 12/16/2009   GERD 10/23/2009   PALPITATIONS 10/23/2009   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   DYSTHYMIC DISORDER 06/20/2009   CONDYLOMA ACUMINATUM 04/23/2009   LENTIGO 04/23/2009   DENTAL PAIN 03/19/2009   CERVICAL RADICULOPATHY 12/11/2008   Cervical radiculopathy 12/11/2008   PANIC DISORDER WITHOUT AGORAPHOBIA 08/07/2008   TOBACCO ABUSE 08/07/2008   BACK PAIN, THORACIC REGION 07/07/2007   ALLERGIC RHINITIS 06/02/2007   Past Medical History:  Diagnosis Date   ABDOMINAL WALL HERNIA 02/27/2010  Abnormal Pap smear    ALLERGIC RHINITIS 06/02/2007   ANOREXIA, CHRONIC 08/07/2008   ANXIETY 09/15/2010   Arthritis    BACK PAIN, THORACIC REGION 07/07/2007   Bipolar disorder (HCC)    CERVICAL RADICULOPATHY 12/11/2008   Complication of anesthesia    Pateint reprots a high tolerance   Complication of anesthesia    pt states she has not had her voice completely back since surgery in Nov. 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   Eating disorder    ENDOMETRIOSIS 12/16/2009   FATIGUE 06/11/2010   Fibromyalgia    GERD 10/23/2009   Heart palpitations     HERPES SIMPLEX INFECTION 06/11/2010   LENTIGO 04/23/2009   LUNG NODULE 06/17/2010   Narcotic abuse, continuous (HCC)    pt denies   Ovarian cyst    Palpitations 10/23/2009   Panic disorder    Rheumatoid arthritis(714.0)    Stricture and stenosis of esophagus 05/13/2010   TOBACCO ABUSE 08/07/2008   Urinary tract infection    UTI (urinary tract infection)    Family History  Problem Relation Age of Onset   Depression Mother    Arthritis Mother    Hyperlipidemia Father    Hypertension Father    Past Surgical History:  Procedure Laterality Date   ABDOMINAL SURGERY     ABDOMINAL WALL MESH  REMOVAL     ABLATION ON ENDOMETRIOSIS     CESAREAN SECTION     x2    CYSTOSCOPY N/A 11/22/2023   Procedure: CYSTOSCOPY;  Surgeon: Roxane Copp, MD;  Location: [facility];  Service: Urology;  Laterality: N/A;   DILATION AND CURETTAGE OF UTERUS     x1    ENDOMETRIAL ABLATION     esophageal dilatation x 4     GUM SURGERY     HERNIA REPAIR     X3   PARTIAL HYSTERECTOMY     POSTERIOR LUMBAR FUSION 2 WITH HARDWARE REMOVAL Left 03/29/2024   Procedure: ARTHROSCOPY, SHOULDER WITH DEBRIDEMENT;  Surgeon: Jasmine Mesi, MD;  Location: [facility] OR;  Service: Orthopedics;  Laterality: Left;  LEFT SHOULDER ARTHROSCOPY, DEBRIDEMENT, DISTAL CLAVICLE EXCISION   RESECTION DISTAL CLAVICAL Left 03/29/2024   Procedure: EXCISION, CLAVICLE, DISTAL, OPEN;  Surgeon: Jasmine Mesi, MD;  Location: [name] OR;  Service: Orthopedics;  Laterality: Left;   SHOULDER SURGERY Right 09/2023   Social History   Occupational History   Not on file  Tobacco Use   Smoking status: Every Day    Current packs/day: 0.20    Types: Cigarettes   Smokeless tobacco: Never   Tobacco comments:    2-3 cigarettes per day as of May 2025  Vaping Use   Vaping status: Every Day  Substance and Sexual Activity   Alcohol use: No   Drug use: No   Sexual activity: Yes    Birth control/protection: Surgical

## 2024-04-03 ENCOUNTER — Telehealth: Payer: Self-pay | Admitting: Orthopedic Surgery

## 2024-04-03 ENCOUNTER — Other Ambulatory Visit: Payer: Self-pay | Admitting: Surgical

## 2024-04-03 MED ORDER — OXYCODONE-ACETAMINOPHEN 10-325 MG PO TABS: 0.5000 | ORAL_TABLET | ORAL | 0 refills | Status: DC | PRN

## 2024-04-03 NOTE — Telephone Encounter (Signed)
 Pt called about update of her pain medication beng sent in. Explained to pt they are in surgery and when they have downtime and see their messages they will get it sent in. Please call pt when meds have been sent to her pharmacy. Pt phone number is [phone].

## 2024-04-05 ENCOUNTER — Telehealth: Payer: Self-pay | Admitting: Orthopedic Surgery

## 2024-04-05 ENCOUNTER — Ambulatory Visit (INDEPENDENT_AMBULATORY_CARE_PROVIDER_SITE_OTHER): Admitting: Surgical

## 2024-04-05 DIAGNOSIS — M5416 Radiculopathy, lumbar region: Secondary | ICD-10-CM

## 2024-04-05 DIAGNOSIS — M5410 Radiculopathy, site unspecified: Secondary | ICD-10-CM

## 2024-04-05 NOTE — Telephone Encounter (Signed)
 Pt called requesting xrays of left knee and leg of visits with Dr Rozelle Corning. Pt has an appt today.  Please have ready today. Pt appt with Rozelle Corning at 3:15 pm. Pt phone number is [phone].

## 2024-04-05 NOTE — Telephone Encounter (Signed)
 Gave patient copies of x-rays and CD

## 2024-04-05 NOTE — Telephone Encounter (Signed)
 I can just let me know what she wants and I will do it.

## 2024-04-06 ENCOUNTER — Encounter: Payer: Self-pay | Admitting: Surgical

## 2024-04-06 NOTE — Progress Notes (Signed)
 Post-Op Visit Note   Patient: Gina [name]           Date of Birth: [birth year]           MRN: [id number] Visit Date: 04/05/2024 PCP: No primary care provider on file.   Assessment & Plan:  Chief Complaint:  Chief Complaint  Patient presents with   Left Shoulder - Routine Post Op   Visit Diagnoses:  1. Lumbar radiculopathy   2. Radicular syndrome of left lower extremity     Plan: Patient is a 48 year old female who presents s/p left shoulder arthroscopy with distal clavicle excision on 03/29/2024.  Overall she is doing okay.  Taking Percocet every 4 hours except when she is sleeping.  She is doing a lot better in terms of pain control with this shoulder surgery compared with her right shoulder surgery.  Pain is a lot more tolerable and she is able to shower and she is a lot more independent than she was after her right distal clavicle excision.  She is using ice machine constantly throughout the day.  Has had some rashes in her armpit.  In addition to the left shoulder pain, she continues to complain of left leg radicular pain.  Previously she had this extending from the knee down to the ankle and the dorsum of the foot.  Now she states that this is her entire leg with radiation from the low back all the way down to her foot.  She has associated low back pain.  She has had similar symptoms on and off over the last several months.  Has had prior MRI of the lumbar spine about 9 months ago demonstrating disc degeneration at L1-L2 but no significant foraminal or spinal stenosis.  Ortho exam demonstrates left arm with intact EPL, FPL, finger abduction, pronation/supination, bicep, tricep, deltoid.  Axillary nerve intact with deltoid firing.  Incisions are healing well with sutures intact.  Sutures removed and replaced Steri-Strips today.  2+ radial pulse of the left upper extremity.  No evidence of infection or dehiscence around the incisions.  Mild to moderate erythema noted in the armpit  without any blistering or drainage.  Plan at this time is continue with left shoulder range of motion as she can tolerate in her daily life.  Discontinue sling.  Hold off on starting actual physical therapy until probably about a month out from surgery.  We can start that when she comes back in 3 weeks for clinical recheck.  Regarding her low back pain and radicular pain, think we could try diagnostic injection into the lumbar spine with Dr. Daisey Dryer to see if this will provide any relief for her.  She would like to give this a try and if this does not really help her at all, suspect that most of her pain would be either neurologic or due to her fibromyalgia based on the results of her MRI scan.  Follow-Up Instructions: No follow-ups on file.   Orders:  Orders Placed This Encounter  Procedures   Ambulatory referral to Physical Medicine Rehab   No orders of the defined types were placed in this encounter.   Imaging: No results found.  PMFS History: Patient Active Problem List   Diagnosis Date Noted   Chronic pain in left shoulder 11/01/2023   Calcific supraspinatus tendonitis 10/03/2023   Bursitis of right shoulder 10/03/2023   Arthritis of right acromioclavicular joint 10/03/2023   S/P arthroscopy of right shoulder 09/27/2023   Persistent headaches 01/18/2023  Blurry vision 01/18/2023   Weight gain 01/18/2023   SBO (small bowel obstruction) (HCC) 09/13/2022   Nausea and vomiting 09/11/2022   Partial small bowel obstruction (HCC) 09/10/2022   Piriformis syndrome of both sides 05/31/2019   History of bipolar disorder 04/18/2019   Tardive dyskinesia 04/18/2019   Urticaria due to drug allergy 04/18/2019   Lumbar spondylosis 02/05/2019   Bipolar affective disorder, current episode mixed (HCC) 01/02/2019   Arthritis 01/02/2019   Cigarette nicotine dependence without complication 01/02/2019   Bilateral temporomandibular joint pain 10/02/2018   Referred otalgia, bilateral 10/02/2018    Paresthesias 03/09/2018   Chronic migraine without aura, intractable, with status migrainosus 04/22/2016   Bipolar 1 disorder, mixed, moderate (HCC) 07/15/2015    Class: Chronic   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   Sciatica 01/09/2015   Reactive hypoglycemia 12/17/2013   Musculoskeletal malfunction arising from mental factors 04/04/2013   Incisional hernia 11/15/2012   Other postprocedural status(V45.89) 11/15/2012   S/P hernia surgery 11/15/2012   Abdominal pain 11/11/2012   Chronic pain syndrome 11/11/2012   Disorder of sacrum 11/11/2012   Chronic pain 12/21/2011   Heartburn 10/04/2011   DYSPHAGIA 10/04/2011   Depression with anxiety 09/15/2010   CONSTIPATION 09/15/2010   LUNG NODULE 06/17/2010   Herpes simplex virus (HSV) infection 06/11/2010   FATIGUE 06/11/2010   STRICTURE AND STENOSIS OF ESOPHAGUS 05/13/2010   ABDOMINAL WALL HERNIA 02/27/2010   ENDOMETRIOSIS 12/16/2009   GERD 10/23/2009   PALPITATIONS 10/23/2009   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   DYSTHYMIC DISORDER 06/20/2009   CONDYLOMA ACUMINATUM 04/23/2009   LENTIGO 04/23/2009   DENTAL PAIN 03/19/2009   CERVICAL RADICULOPATHY 12/11/2008   Cervical radiculopathy 12/11/2008   PANIC DISORDER WITHOUT AGORAPHOBIA 08/07/2008   TOBACCO ABUSE 08/07/2008   BACK PAIN, THORACIC REGION 07/07/2007   ALLERGIC RHINITIS 06/02/2007   Past Medical History:  Diagnosis Date   ABDOMINAL WALL HERNIA 02/27/2010   Abnormal Pap smear    ALLERGIC RHINITIS 06/02/2007   ANOREXIA, CHRONIC 08/07/2008   ANXIETY 09/15/2010   Arthritis    BACK PAIN, THORACIC REGION 07/07/2007   Bipolar disorder (HCC)    CERVICAL RADICULOPATHY 12/11/2008   Complication of anesthesia    Pateint reprots a high tolerance   Complication of anesthesia    pt states she has not had her voice completely back since surgery in Nov. 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   Eating disorder    ENDOMETRIOSIS 12/16/2009   FATIGUE 06/11/2010   Fibromyalgia    GERD 10/23/2009   Heart palpitations    HERPES SIMPLEX INFECTION 06/11/2010   LENTIGO 04/23/2009   LUNG NODULE 06/17/2010   Narcotic abuse, continuous (HCC)    pt denies   Ovarian cyst    Palpitations 10/23/2009   Panic disorder    Rheumatoid arthritis(714.0)    Stricture and stenosis of esophagus 05/13/2010   TOBACCO ABUSE 08/07/2008   Urinary tract infection    UTI (urinary tract infection)     Family History  Problem Relation Age of Onset   Depression Mother    Arthritis Mother    Hyperlipidemia Father    Hypertension Father     Past Surgical History:  Procedure Laterality Date   ABDOMINAL SURGERY     ABDOMINAL WALL MESH  REMOVAL     ABLATION ON ENDOMETRIOSIS     CESAREAN SECTION     x2    CYSTOSCOPY  N/A 11/22/2023   Procedure: CYSTOSCOPY;  Surgeon: Roxane Copp, MD;  Location: [facility];  Service: Urology;  Laterality: N/A;   DILATION AND CURETTAGE OF UTERUS     x1    ENDOMETRIAL ABLATION     esophageal dilatation x 4     GUM SURGERY     HERNIA REPAIR     X3   PARTIAL HYSTERECTOMY     POSTERIOR LUMBAR FUSION 2 WITH HARDWARE REMOVAL Left 03/29/2024   Procedure: ARTHROSCOPY, SHOULDER WITH DEBRIDEMENT;  Surgeon: Jasmine Mesi, MD;  Location: [facility] OR;  Service: Orthopedics;  Laterality: Left;  LEFT SHOULDER ARTHROSCOPY, DEBRIDEMENT, DISTAL CLAVICLE EXCISION   RESECTION DISTAL CLAVICAL Left 03/29/2024   Procedure: EXCISION, CLAVICLE, DISTAL, OPEN;  Surgeon: Jasmine Mesi, MD;  Location: [facility] OR;  Service: Orthopedics;  Laterality: Left;   SHOULDER SURGERY Right 09/2023   Social History   Occupational History   Not on file  Tobacco Use   Smoking status: Every Day    Current packs/day: 0.20    Types: Cigarettes   Smokeless tobacco: Never   Tobacco comments:    2-3 cigarettes per day as of May 2025  Vaping Use   Vaping  status: Every Day  Substance and Sexual Activity   Alcohol use: No   Drug use: No   Sexual activity: Yes    Birth control/protection: Surgical

## 2024-04-10 ENCOUNTER — Other Ambulatory Visit: Payer: Self-pay | Admitting: Surgical

## 2024-04-10 ENCOUNTER — Telehealth: Payer: Self-pay | Admitting: Surgical

## 2024-04-10 MED ORDER — OXYCODONE-ACETAMINOPHEN 10-325 MG PO TABS: 0.5000 | ORAL_TABLET | Freq: Four times a day (QID) | ORAL | 0 refills | Status: DC | PRN

## 2024-04-10 NOTE — Telephone Encounter (Signed)
 Patient called. Says she needs a refill on oxycodone . Also would like something to take for her swollen.

## 2024-04-10 NOTE — Telephone Encounter (Signed)
 Called and advised pt.

## 2024-04-10 NOTE — Telephone Encounter (Signed)
 Filled today by [facility]

## 2024-04-14 DIAGNOSIS — M19012 Primary osteoarthritis, left shoulder: Secondary | ICD-10-CM

## 2024-04-16 ENCOUNTER — Other Ambulatory Visit: Payer: Self-pay | Admitting: Surgical

## 2024-04-16 MED ORDER — OXYCODONE-ACETAMINOPHEN 10-325 MG PO TABS: 0.5000 | ORAL_TABLET | Freq: Three times a day (TID) | ORAL | 0 refills | Status: DC | PRN

## 2024-04-24 ENCOUNTER — Telehealth: Payer: Self-pay | Admitting: Orthopedic Surgery

## 2024-04-24 ENCOUNTER — Other Ambulatory Visit: Payer: Self-pay | Admitting: Surgical

## 2024-04-24 MED ORDER — OXYCODONE-ACETAMINOPHEN 10-325 MG PO TABS: 0.5000 | ORAL_TABLET | Freq: Three times a day (TID) | ORAL | 0 refills | Status: DC | PRN

## 2024-04-24 NOTE — Telephone Encounter (Signed)
 Patient called and said she needs something for pain. CB#[phone]

## 2024-04-25 ENCOUNTER — Other Ambulatory Visit: Payer: Self-pay | Admitting: Surgical

## 2024-04-25 ENCOUNTER — Telehealth: Payer: Self-pay | Admitting: Orthopedic Surgery

## 2024-04-25 MED ORDER — OXYCODONE-ACETAMINOPHEN 5-325 MG PO TABS: ORAL_TABLET | ORAL | 0 refills | Status: DC

## 2024-04-25 NOTE — Telephone Encounter (Signed)
 noted

## 2024-04-25 NOTE — Telephone Encounter (Signed)
 I called and spoke with [facility].  Too soon to refill the prescription and they cannot override it.  I tried to [facility] another prescription at a lower dosage and [facility].  We are working on tapering off the medication for Gina [name] who has a lot of susceptibility to postop pain.  Plan to see her on Friday and she will let us  know if there is any issue picking up this prescription.

## 2024-04-25 NOTE — Telephone Encounter (Signed)
 Pt states she was told by Herlene that the Rx for Oxycodone  was sent in on 04/24/24. Pt states she has check [facility] and [facility] and neither has the Rx. The Rx  should have been sent to [facility] on [address].    Pt request call ASAP

## 2024-04-27 ENCOUNTER — Encounter: Payer: Self-pay | Admitting: Surgical

## 2024-04-27 ENCOUNTER — Ambulatory Visit (INDEPENDENT_AMBULATORY_CARE_PROVIDER_SITE_OTHER): Admitting: Surgical

## 2024-04-27 DIAGNOSIS — M25512 Pain in left shoulder: Secondary | ICD-10-CM

## 2024-04-27 DIAGNOSIS — Z9889 Other specified postprocedural states: Secondary | ICD-10-CM

## 2024-04-27 NOTE — Progress Notes (Signed)
 Post-Op Visit Note   Patient: Gina [name]           Date of Birth: 1975-12-04           MRN: [id number] Visit Date: 04/27/2024 PCP: No primary care provider on file.   Assessment & Plan:  Chief Complaint:  Chief Complaint  Patient presents with   Left Shoulder - Follow-up, Routine Post Op    03/29/2024 left shoulder arthroscopy, DCE   Visit Diagnoses:  1. S/P shoulder surgery   2. Left shoulder pain, unspecified chronicity     Plan: Patient is a 48 year old female who presents s/p left shoulder arthroscopy with distal clavicle excision on 03/29/2024.  She states that overall she is doing a lot better than she was.  Still has some pain in her left shoulder and some popping on occasion but this is progressively improving.  Some days she has some setbacks but understands that this is normal.  Has abstained from taking oxycodone  today so far and is working on weaning herself off of these medications.  On exam, patient has incisions that are healing well.  Range of motion demonstrates 50 degrees X rotation, 90 degrees abduction, 150 degrees forward elevation passively of the left shoulder.  Intact axillary nerve with deltoid firing.  2+ radial pulse of the operative extremity.  Plan at this time is continue with range of motion as tolerated.  Okay to start physical therapy and she will pursue this next week with a PT friend of hers that she used for her right shoulder.  She is okay for full active and passive range of motion of the operative shoulder and okay for light lifting though I would hold off on this based on her symptoms for now.  Call with any concerns but otherwise follow-up in 1 month.  We will work on discontinuing opioid medication around the 17-month postop mark which she is completely on board with.  We also examined her left leg today.  She has history of patellar fracture.  She has minimal tenderness over the inferior pole of the patella.  Able to perform straight leg  raise with excellent quad strength rated 5/5.  She has also had some dorsiflexion weakness in the past with intermittent radicular symptoms of the left leg.  Today she has 5/5 strength of dorsiflexion, plantarflexion, hamstring, quad, hip flexion.  Plan to hold off on any lumbar spine ESI or any further evaluation.  If she has return of radicular symptoms of the left leg, since she has negative nerve conduction study and essentially normal MRI of the lumbar spine, would recommend evaluation by neurology.  Follow-Up Instructions: No follow-ups on file.   Orders:  No orders of the defined types were placed in this encounter.  No orders of the defined types were placed in this encounter.   Imaging: No results found.  PMFS History: Patient Active Problem List   Diagnosis Date Noted   Arthritis of left acromioclavicular joint 04/14/2024   Chronic pain in left shoulder 11/01/2023   Calcific supraspinatus tendonitis 10/03/2023   Bursitis of right shoulder 10/03/2023   Arthritis of right acromioclavicular joint 10/03/2023   S/P arthroscopy of right shoulder 09/27/2023   Persistent headaches 01/18/2023   Blurry vision 01/18/2023   Weight gain 01/18/2023   SBO (small bowel obstruction) (HCC) 09/13/2022   Nausea and vomiting 09/11/2022   Partial small bowel obstruction (HCC) 09/10/2022   Piriformis syndrome of both sides 05/31/2019   History of bipolar disorder 04/18/2019  Tardive dyskinesia 04/18/2019   Urticaria due to drug allergy 04/18/2019   Lumbar spondylosis 02/05/2019   Bipolar affective disorder, current episode mixed (HCC) 01/02/2019   Arthritis 01/02/2019   Cigarette nicotine dependence without complication 01/02/2019   Bilateral temporomandibular joint pain 10/02/2018   Referred otalgia, bilateral 10/02/2018   Paresthesias 03/09/2018   Chronic migraine without aura, intractable, with status migrainosus 04/22/2016   Bipolar 1 disorder, mixed, moderate (HCC) 07/15/2015     Class: Chronic   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   Sciatica 01/09/2015   Reactive hypoglycemia 12/17/2013   Musculoskeletal malfunction arising from mental factors 04/04/2013   Incisional hernia 11/15/2012   Other postprocedural status(V45.89) 11/15/2012   S/P hernia surgery 11/15/2012   Abdominal pain 11/11/2012   Chronic pain syndrome 11/11/2012   Disorder of sacrum 11/11/2012   Chronic pain 12/21/2011   Heartburn 10/04/2011   DYSPHAGIA 10/04/2011   Depression with anxiety 09/15/2010   CONSTIPATION 09/15/2010   LUNG NODULE 06/17/2010   Herpes simplex virus (HSV) infection 06/11/2010   FATIGUE 06/11/2010   STRICTURE AND STENOSIS OF ESOPHAGUS 05/13/2010   ABDOMINAL WALL HERNIA 02/27/2010   ENDOMETRIOSIS 12/16/2009   GERD 10/23/2009   PALPITATIONS 10/23/2009   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   DYSTHYMIC DISORDER 06/20/2009   CONDYLOMA ACUMINATUM 04/23/2009   LENTIGO 04/23/2009   DENTAL PAIN 03/19/2009   CERVICAL RADICULOPATHY 12/11/2008   Cervical radiculopathy 12/11/2008   PANIC DISORDER WITHOUT AGORAPHOBIA 08/07/2008   TOBACCO ABUSE 08/07/2008   BACK PAIN, THORACIC REGION 07/07/2007   ALLERGIC RHINITIS 06/02/2007   Past Medical History:  Diagnosis Date   ABDOMINAL WALL HERNIA 02/27/2010   Abnormal Pap smear    ALLERGIC RHINITIS 06/02/2007   ANOREXIA, CHRONIC 08/07/2008   ANXIETY 09/15/2010   Arthritis    BACK PAIN, THORACIC REGION 07/07/2007   Bipolar disorder (HCC)    CERVICAL RADICULOPATHY 12/11/2008   Complication of anesthesia    Pateint reprots a high tolerance   Complication of anesthesia    pt states she has not had her voice completely back since surgery in Nov. 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   Eating disorder    ENDOMETRIOSIS 12/16/2009   FATIGUE 06/11/2010   Fibromyalgia    GERD 10/23/2009   Heart palpitations     HERPES SIMPLEX INFECTION 06/11/2010   LENTIGO 04/23/2009   LUNG NODULE 06/17/2010   Narcotic abuse, continuous (HCC)    pt denies   Ovarian cyst    Palpitations 10/23/2009   Panic disorder    Rheumatoid arthritis(714.0)    Stricture and stenosis of esophagus 05/13/2010   TOBACCO ABUSE 08/07/2008   Urinary tract infection    UTI (urinary tract infection)     Family History  Problem Relation Age of Onset   Depression Mother    Arthritis Mother    Hyperlipidemia Father    Hypertension Father     Past Surgical History:  Procedure Laterality Date   ABDOMINAL SURGERY     ABDOMINAL WALL MESH  REMOVAL     ABLATION ON ENDOMETRIOSIS     CESAREAN SECTION     x2    CYSTOSCOPY N/A 11/22/2023   Procedure: CYSTOSCOPY;  Surgeon: Elisabeth Valli BIRCH, MD;  Location: [facility];  Service: Urology;  Laterality: N/A;   DILATION AND CURETTAGE OF UTERUS     x1    ENDOMETRIAL ABLATION     esophageal dilatation x 4  GUM SURGERY     HERNIA REPAIR     X3   PARTIAL HYSTERECTOMY     POSTERIOR LUMBAR FUSION 2 WITH HARDWARE REMOVAL Left 03/29/2024   Procedure: ARTHROSCOPY, SHOULDER WITH DEBRIDEMENT;  Surgeon: Addie Cordella Hamilton, MD;  Location: [facility] OR;  Service: Orthopedics;  Laterality: Left;  LEFT SHOULDER ARTHROSCOPY, DEBRIDEMENT, DISTAL CLAVICLE EXCISION   RESECTION DISTAL CLAVICAL Left 03/29/2024   Procedure: EXCISION, CLAVICLE, DISTAL, OPEN;  Surgeon: Addie Cordella Hamilton, MD;  Location: [facility] OR;  Service: Orthopedics;  Laterality: Left;   SHOULDER SURGERY Right 09/2023   Social History   Occupational History   Not on file  Tobacco Use   Smoking status: Every Day    Current packs/day: 0.20    Types: Cigarettes   Smokeless tobacco: Never   Tobacco comments:    2-3 cigarettes per day as of May 2025  Vaping Use   Vaping status: Every Day  Substance and Sexual Activity   Alcohol use: No   Drug use: No   Sexual activity: Yes    Birth control/protection: Surgical

## 2024-04-30 ENCOUNTER — Ambulatory Visit (INDEPENDENT_AMBULATORY_CARE_PROVIDER_SITE_OTHER): Admitting: Family Medicine

## 2024-04-30 ENCOUNTER — Encounter: Payer: Self-pay | Admitting: Family Medicine

## 2024-04-30 VITALS — BP 118/82 | HR 87 | Temp 98.8°F | Ht 67.0 in | Wt 235.0 lb

## 2024-04-30 DIAGNOSIS — Z7689 Persons encountering health services in other specified circumstances: Secondary | ICD-10-CM | POA: Diagnosis not present

## 2024-04-30 DIAGNOSIS — G894 Chronic pain syndrome: Secondary | ICD-10-CM

## 2024-04-30 DIAGNOSIS — Z6836 Body mass index (BMI) 36.0-36.9, adult: Secondary | ICD-10-CM | POA: Diagnosis not present

## 2024-04-30 DIAGNOSIS — E66812 Obesity, class 2: Secondary | ICD-10-CM

## 2024-04-30 MED ORDER — METHOCARBAMOL 500 MG PO TABS: 500.0000 mg | ORAL_TABLET | Freq: Three times a day (TID) | ORAL | 0 refills | Status: DC | PRN

## 2024-04-30 NOTE — Patient Instructions (Addendum)
-  It was nice to meet you and look forward to taking care of you. -Offered a referral to GI with [facility], but declined. If you change your mind, please send a MyChart message or call the office. Will be happy to send a referral. -Provided Robaxin  500mg  tablet, 1 tablet every 8 hours as needed for pain. Caution: medication can cause drowsiness.  -Recommend to follow up in 2 weeks.

## 2024-04-30 NOTE — Progress Notes (Signed)
 New Patient Office Visit  Subjective   Patient ID: Gina [name], female    DOB: 1976/01/20  Age: 48 y.o. MRN: [id number]  CC:  Chief Complaint  Patient presents with   Establish Care    HPI Gina [name] presents to establish care with new provider.  Patients previous primary care provider: [name] with Gina [name], Gina [name]   Specialist: Gina [name] with Gina [name] and Gina [name], Gina [name].  Gina [name] with Gina [name]  Gina [name] with Gina [name]  Gina [name]   Patient is complaining diarrhea, nausea, vomiting, and bloating. This has been occurring intermittent for the last 2 years. She reports she has been seen by GI. Based on chart review, seen GI with Gina [name] and [name].   Patient requesting Robaxin  for pain relating to her history of fibromyalgia. She reports she alternates between Robaxin  and Flexeril .   All medications are being managed by specialist.  Outpatient Encounter Medications as of 04/30/2024  Medication Sig   calcium carbonate (TUMS - DOSED IN MG ELEMENTAL CALCIUM) 500 MG chewable tablet Chew 1 tablet by mouth 4 (four) times daily as needed for indigestion or heartburn.   Cholecalciferol (VITAMIN D3 PO) Take 2 each by mouth daily. Gummy   ELESTRIN 0.52 MG/0.87 GM (0.06%) GEL Apply 1 Application topically every evening.   escitalopram  (LEXAPRO ) 20 MG tablet Take 20 mg by mouth at bedtime.   LORazepam  (ATIVAN ) 1 MG tablet Take 1 mg by mouth every 8 (eight) hours as needed for anxiety.   lurasidone  (LATUDA ) 40 MG TABS tablet Take 40 mg by mouth every evening.   methocarbamol  (ROBAXIN ) 500 MG tablet Take 1 tablet (500 mg total) by mouth every 8 (eight) hours as needed for up to 7 days.   oxyCODONE -acetaminophen  (PERCOCET) 5-325 MG tablet Take one tablet by mouth every 8-12 hours  as needed for severe pain.   No facility-administered encounter medications on file as of 04/30/2024.    Past Medical History:  Diagnosis Date   ABDOMINAL WALL HERNIA 02/27/2010   Abnormal Pap smear    ALLERGIC RHINITIS 06/02/2007   ANOREXIA, CHRONIC 08/07/2008   ANXIETY 09/15/2010   Arthritis    BACK PAIN, THORACIC REGION 07/07/2007   Bipolar disorder (HCC)    CERVICAL RADICULOPATHY 12/11/2008   Complication of anesthesia    Pateint reprots a high tolerance   Complication of anesthesia    pt states she has not had her voice completely back since surgery in Nov. 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   Eating disorder    ENDOMETRIOSIS 12/16/2009   FATIGUE 06/11/2010   Fibromyalgia    GERD 10/23/2009   Heart palpitations    HERPES SIMPLEX INFECTION 06/11/2010   LENTIGO 04/23/2009   LUNG NODULE 06/17/2010   Narcotic abuse, continuous (HCC)    pt denies   Ovarian cyst    Palpitations 10/23/2009   Panic disorder    Rheumatoid arthritis(714.0)    Stricture and stenosis of esophagus 05/13/2010   TOBACCO ABUSE 08/07/2008   Urinary tract infection    UTI (urinary tract infection)     Past Surgical History:  Procedure Laterality Date   ABDOMINAL SURGERY     ABDOMINAL WALL MESH  REMOVAL     ABLATION ON ENDOMETRIOSIS     CESAREAN SECTION  x2    CYSTOSCOPY N/A 11/22/2023   Procedure: CYSTOSCOPY;  Surgeon: Gina Valli BIRCH, MD;  Location: [facility];  Service: Urology;  Laterality: N/A;   DILATION AND CURETTAGE OF UTERUS     x1    ENDOMETRIAL ABLATION     esophageal dilatation x 4     GUM SURGERY     HERNIA REPAIR     X3   PARTIAL HYSTERECTOMY     POSTERIOR LUMBAR FUSION 2 WITH HARDWARE REMOVAL Left 03/29/2024   Procedure: ARTHROSCOPY, SHOULDER WITH DEBRIDEMENT;  Surgeon: [name] Cordella Hamilton, MD;  Location: [facility] OR;  Service: Orthopedics;  Laterality: Left;  LEFT SHOULDER  ARTHROSCOPY, DEBRIDEMENT, DISTAL CLAVICLE EXCISION   RESECTION DISTAL CLAVICAL Left 03/29/2024   Procedure: EXCISION, CLAVICLE, DISTAL, OPEN;  Surgeon: [name] Cordella Hamilton, MD;  Location: [facility] OR;  Service: Orthopedics;  Laterality: Left;   SHOULDER SURGERY Right 09/2023    Family History  Problem Relation Age of Onset   Depression Mother    Arthritis Mother    Hyperlipidemia Father    Hypertension Father     Social History   Socioeconomic History   Marital status: Married    Spouse name: Not on file   Number of children: 2   Years of education: Not on file   Highest education level: Master's degree (e.g., MA, MS, MEng, MEd, MSW, MBA)  Occupational History   Not on file  Tobacco Use   Smoking status: Every Day    Current packs/day: 0.20    Types: Cigarettes   Smokeless tobacco: Never   Tobacco comments:    2-3 cigarettes per day as of May 2025  Vaping Use   Vaping status: Every Day  Substance and Sexual Activity   Alcohol use: No   Drug use: No   Sexual activity: Yes    Birth control/protection: Surgical  Other Topics Concern   Not on file  Social History Narrative   Not on file   Social Drivers of Health   Financial Resource Strain: Low Risk  (04/30/2024)   Overall Financial Resource Strain (CARDIA)    Difficulty of Paying Living Expenses: Not hard at all  Food Insecurity: No Food Insecurity (09/27/2023)   Hunger Vital Sign    Worried About Running Out of Food in the Last Year: Never true    Ran Out of Food in the Last Year: Never true  Transportation Needs: No Transportation Needs (09/27/2023)   PRAPARE - Administrator, Civil Service (Medical): No    Lack of Transportation (Non-Medical): No  Physical Activity: Inactive (04/30/2024)   Exercise Vital Sign    Days of Exercise per Week: 0 days    Minutes of Exercise per Session: 0 min  Stress: No Stress Concern Present (04/30/2024)   [name] of Occupational Health - Occupational Stress  Questionnaire    Feeling of Stress: Only a little  Social Connections: Moderately Integrated (04/30/2024)   Social Connection and Isolation Panel    Frequency of Communication with Friends and Family: Three times a week    Frequency of Social Gatherings with Friends and Family: Twice a week    Attends Religious Services: More than 4 times per year    Active Member of [employer] or Organizations: No    Attends Banker Meetings: Never    Marital Status: Married  Catering manager Violence: Not At Risk (09/27/2023)   Humiliation, Afraid, Rape, and Kick questionnaire    Fear of Current or Ex-Partner: No  Emotionally Abused: No    Physically Abused: No    Sexually Abused: No    ROS See HPI above    Objective  BP 118/82   Pulse 87   Temp 98.8 F (37.1 C) (Oral)   Ht 5' 7 (1.702 m)   Wt 235 lb (106.6 kg)   LMP 01/01/2004   SpO2 97%   BMI 36.81 kg/m   Physical Exam Vitals reviewed.  Constitutional:      General: She is not in acute distress.    Appearance: Normal appearance. She is not ill-appearing, toxic-appearing or diaphoretic.  HENT:     Head: Normocephalic and atraumatic.   Eyes:     General:        Right eye: No discharge.        Left eye: No discharge.     Conjunctiva/sclera: Conjunctivae normal.    Cardiovascular:     Rate and Rhythm: Normal rate and regular rhythm.     Heart sounds: Normal heart sounds. No murmur heard.    No friction rub. No gallop.  Pulmonary:     Effort: Pulmonary effort is normal. No respiratory distress.     Breath sounds: Normal breath sounds.  Abdominal:     General: Abdomen is flat. There is no distension.     Palpations: Abdomen is soft. There is no mass.     Tenderness: There is no abdominal tenderness. There is no right CVA tenderness or left CVA tenderness.   Musculoskeletal:        General: Normal range of motion.   Skin:    General: Skin is warm and dry.   Neurological:     General: No focal deficit present.      Mental Status: She is alert and oriented to person, place, and time. Mental status is at baseline.   [name]:        Mood and Affect: Mood normal.        Behavior: Behavior normal.        Thought Content: Thought content normal.        Judgment: Judgment normal.      Assessment & Plan:  Class 2 severe obesity due to excess calories with serious comorbidity and body mass index (BMI) of 36.0 to 36.9 in adult [facility])  Chronic pain syndrome -     Methocarbamol ; Take 1 tablet (500 mg total) by mouth every 8 (eight) hours as needed for up to 7 days.  Dispense: 21 tablet; Refill: 0  Encounter to establish care   1.Review health maintenance: -Cervical Cancer Screening: Last year, request records  -Covid boosters: Declines  -Tdap: Recommend to obtain at local pharmacy  -Hep B: Unknown  -Hep C screening:  -AWV: needs visit  -PNA vaccine: Recommend to to obtain at local pharmacy -Mammogram: this year; request records from [name].  2. Offered a referral to GI with [facility], but declined. Advised if she changes her mind, please send a MyChart message or call the office. Will be happy to send a referral. 3. Provided Robaxin  500mg  tablet, 1 tablet every 8 hours as needed for pain. Caution: medication can cause drowsiness.  4.Recommend to follow up in 2 weeks. Initially patient was going to do labs, but declined. Recommend to have labs drawn at next appointment.  Return in about 2 weeks (around 05/14/2024) for follow-up.   Telia Amundson, NP

## 2024-05-02 ENCOUNTER — Telehealth: Payer: Self-pay | Admitting: Surgical

## 2024-05-02 ENCOUNTER — Encounter (HOSPITAL_BASED_OUTPATIENT_CLINIC_OR_DEPARTMENT_OTHER): Payer: Self-pay

## 2024-05-02 ENCOUNTER — Emergency Department (HOSPITAL_BASED_OUTPATIENT_CLINIC_OR_DEPARTMENT_OTHER)

## 2024-05-02 ENCOUNTER — Emergency Department (HOSPITAL_BASED_OUTPATIENT_CLINIC_OR_DEPARTMENT_OTHER)
Admission: EM | Admit: 2024-05-02 | Discharge: 2024-05-02 | Disposition: A | Discharge status: Home / Self Care | Attending: Emergency Medicine | Admitting: Emergency Medicine

## 2024-05-02 ENCOUNTER — Other Ambulatory Visit: Payer: Self-pay

## 2024-05-02 DIAGNOSIS — R197 Diarrhea, unspecified: Secondary | ICD-10-CM | POA: Insufficient documentation

## 2024-05-02 DIAGNOSIS — R1084 Generalized abdominal pain: Secondary | ICD-10-CM | POA: Diagnosis not present

## 2024-05-02 DIAGNOSIS — R112 Nausea with vomiting, unspecified: Secondary | ICD-10-CM | POA: Insufficient documentation

## 2024-05-02 DIAGNOSIS — K625 Hemorrhage of anus and rectum: Secondary | ICD-10-CM | POA: Insufficient documentation

## 2024-05-02 DIAGNOSIS — R109 Unspecified abdominal pain: Secondary | ICD-10-CM

## 2024-05-02 LAB — CBC
HCT: 42.5 % (ref 36.0–46.0)
Hemoglobin: 14.7 g/dL (ref 12.0–15.0)
MCH: 32.1 pg (ref 26.0–34.0)
MCHC: 34.6 g/dL (ref 30.0–36.0)
MCV: 92.8 fL (ref 80.0–100.0)
Platelets: 266 10*3/uL (ref 150–400)
RBC: 4.58 MIL/uL (ref 3.87–5.11)
RDW: 12.5 % (ref 11.5–15.5)
WBC: 8.7 10*3/uL (ref 4.0–10.5)
nRBC: 0 % (ref 0.0–0.2)

## 2024-05-02 LAB — COMPREHENSIVE METABOLIC PANEL WITH GFR
ALT: 13 U/L (ref 0–44)
AST: 16 U/L (ref 15–41)
Albumin: 4 g/dL (ref 3.5–5.0)
Alkaline Phosphatase: 121 U/L (ref 38–126)
Anion gap: 11 (ref 5–15)
BUN: 10 mg/dL (ref 6–20)
CO2: 21 mmol/L — ABNORMAL LOW (ref 22–32)
Calcium: 9.2 mg/dL (ref 8.9–10.3)
Chloride: 105 mmol/L (ref 98–111)
Creatinine, Ser: 0.73 mg/dL (ref 0.44–1.00)
GFR, Estimated: >60 mL/min (ref >60)
Glucose, Bld: 94 mg/dL (ref 70–99)
Potassium: 3.8 mmol/L (ref 3.5–5.1)
Sodium: 137 mmol/L (ref 135–145)
Total Bilirubin: 0.5 mg/dL (ref 0.0–1.2)
Total Protein: 6.6 g/dL (ref 6.5–8.1)

## 2024-05-02 LAB — URINALYSIS, ROUTINE W REFLEX MICROSCOPIC
Bilirubin Urine: NEGATIVE
Glucose, UA: NEGATIVE mg/dL
Hgb urine dipstick: NEGATIVE
Ketones, ur: NEGATIVE mg/dL
Leukocytes,Ua: NEGATIVE
Nitrite: NEGATIVE
Protein, ur: NEGATIVE mg/dL
Specific Gravity, Urine: 1.018 (ref 1.005–1.030)
pH: 6 (ref 5.0–8.0)

## 2024-05-02 LAB — LIPASE, BLOOD: Lipase: 16 U/L (ref 11–51)

## 2024-05-02 MED ORDER — LACTATED RINGERS IV BOLUS
1000.0000 mL | Freq: Once | INTRAVENOUS | Status: AC
  Administered 2024-05-02: 1000 mL via INTRAVENOUS

## 2024-05-02 MED ORDER — HYDROCODONE-ACETAMINOPHEN 5-325 MG PO TABS
1.0000 | ORAL_TABLET | ORAL | Status: AC
  Administered 2024-05-02: 1 via ORAL
  Filled 2024-05-02: qty 1

## 2024-05-02 MED ORDER — DIPHENHYDRAMINE HCL 50 MG/ML IJ SOLN
12.5000 mg | Freq: Once | INTRAMUSCULAR | Status: AC
  Administered 2024-05-02: 12.5 mg via INTRAVENOUS
  Filled 2024-05-02: qty 1

## 2024-05-02 MED ORDER — SODIUM CHLORIDE 0.9 % IV SOLN
25.0000 mg | INTRAVENOUS | Status: AC
  Administered 2024-05-02: 25 mg via INTRAVENOUS
  Filled 2024-05-02: qty 1

## 2024-05-02 MED ORDER — LORAZEPAM 2 MG/ML IJ SOLN
0.5000 mg | INTRAMUSCULAR | Status: AC
  Administered 2024-05-02: 0.5 mg via INTRAVENOUS
  Filled 2024-05-02: qty 1

## 2024-05-02 MED ORDER — PROMETHAZINE HCL 25 MG/ML IJ SOLN
INTRAMUSCULAR | Status: AC
  Filled 2024-05-02: qty 1

## 2024-05-02 MED ORDER — PROMETHAZINE HCL 25 MG RE SUPP: 25.0000 mg | Freq: Four times a day (QID) | RECTAL | 0 refills | Status: DC | PRN

## 2024-05-02 MED ORDER — HYDROMORPHONE HCL 1 MG/ML IJ SOLN
0.5000 mg | Freq: Once | INTRAMUSCULAR | Status: AC
  Administered 2024-05-02: 0.5 mg via INTRAVENOUS
  Filled 2024-05-02: qty 1

## 2024-05-02 NOTE — ED Notes (Signed)
 Per registration staff, patient had near syncopal episode standing while at [employer].  Patient was positioned in floor when nursing staff was alerted and came to assist. Patient stated she felt weak. Was able to stand up without assistance and sit in wheelchair. Denies any pain or injuries. Provider made aware.

## 2024-05-02 NOTE — Discharge Instructions (Signed)
 You were seen for your abdominal pain, nausea, vomiting, and diarrhea in the emergency department.   At home, please use the promethazine  for nausea and vomiting.  If that does not work use a suppository that I gave you.  Stay well-hydrated at home.    Check your MyChart online for the results of any tests that had not resulted by the time you left the emergency department.   Follow-up with your primary doctor in 2-3 days regarding your visit.    Return immediately to the emergency department if you experience any of the following: Worsening pain, vomiting despite the medication, or any other concerning symptoms.    Thank you for visiting our Emergency Department. It was a pleasure taking care of you today.

## 2024-05-02 NOTE — Telephone Encounter (Signed)
 Patient called and said she needs a refill on the pain medication. [phone]

## 2024-05-02 NOTE — ED Provider Notes (Incomplete)
 [place] EMERGENCY DEPARTMENT AT [facility] Provider Note   CSN: [id number] Arrival date & time: 05/02/24  1816     Patient presents with: Emesis and Rectal Bleeding   Gina [name] is a 48 y.o. female.  {Add pertinent medical, surgical, social history, OB history to HPI:2853} 48 year old female with a history of endometriosis, multiple abdominal surgeries, chronic abdominal pain and bloating, chronic diarrhea, hysterectomy, and hernia repair who presents emergency room with abdominal pain and nausea and vomiting diarrhea.  Patient reports that approximately 3 weeks ago she had a dental procedure and was given Zofran .  Says that she does have an allergy to Zofran  and afterwards she is been having nausea vomiting and diarrhea.  Happens pretty much every time she eats.  Initially was nonbloody nonbilious but today had some bright red blood in her stool and decided to come into the emergency department.  Does have a bloating sensation in her upper abdomen as well.  Has been trying her promethazine  at home but has not been resolving and her GI doctor said that it would be over a month for her to be seen so she decided to come into the emergency department.  In the triage area did get lightheaded and passed out.  Did not hit her head or suffer any injuries.       Prior to Admission medications   Medication Sig Start Date End Date Taking? Authorizing Provider  calcium carbonate (TUMS - DOSED IN MG ELEMENTAL CALCIUM) 500 MG chewable tablet Chew 1 tablet by mouth 4 (four) times daily as needed for indigestion or heartburn.    [provider]  Cholecalciferol (VITAMIN D3 PO) Take 2 each by mouth daily. Gummy    [provider]  ELESTRIN 0.52 MG/0.87 GM (0.06%) GEL Apply 1 Application topically every evening. 10/03/23   [provider]  escitalopram  (LEXAPRO ) 20 MG tablet Take 20 mg by mouth at bedtime. 12/20/18   [provider]  LORazepam  (ATIVAN )  1 MG tablet Take 1 mg by mouth every 8 (eight) hours as needed for anxiety.    [provider]  lurasidone  (LATUDA ) 40 MG TABS tablet Take 40 mg by mouth every evening. 12/16/22   [provider]  methocarbamol  (ROBAXIN ) 500 MG tablet Take 1 tablet (500 mg total) by mouth every 8 (eight) hours as needed for up to 7 days. 04/30/24 05/07/24  [name], NP  oxyCODONE -acetaminophen  (PERCOCET) 5-325 MG tablet Take one tablet by mouth every 8-12 hours as needed for severe pain. 04/25/24   Magnant, Carlin CROME, PA-C    Allergies: Lamictal [lamotrigine], Maxalt  [rizatriptan ], Morphine  and codeine , Nicotine, Zofran , Chantix  [varenicline  tartrate], Ciprofloxacin , Codeine , Haldol  [haloperidol ], Ibuprofen, Lidoderm  [lidocaine ], Skelaxin [metaxalone], Sulfa antibiotics, Sulfasalazine, Toradol  [ketorolac  tromethamine ], Ambien [zolpidem], Doxycycline , Duloxetine, Ingrezza [valbenazine tosylate], Lyrica [pregabalin], Methadone, Neurontin  [gabapentin ], Other, Prednisone , Amoxicillin, Aripiprazole, Chlorhexidine , Penicillins, and Tramadol     Review of Systems  Updated Vital Signs BP 119/64   Pulse 81   Temp 98.8 F (37.1 C) (Oral)   Resp 18   LMP 01/01/2004   SpO2 98%   Physical Exam Vitals and nursing note reviewed.  Constitutional:      General: She is not in acute distress.    Appearance: She is well-developed.  HENT:     Head: Normocephalic and atraumatic.     Right Ear: External ear normal.     Left Ear: External ear normal.     Nose: Nose normal.  Eyes:     Extraocular  Movements: Extraocular movements intact.     Conjunctiva/sclera: Conjunctivae normal.     Pupils: Pupils are equal, round, and reactive to light.  Abdominal:     General: Abdomen is flat. There is no distension.     Palpations: Abdomen is soft. There is no mass.     Tenderness: There is abdominal tenderness (Minimal, diffuse). There is no guarding.  Musculoskeletal:     Right lower leg: No edema.      Left lower leg: No edema.  Skin:    General: Skin is warm and dry.  Neurological:     Mental Status: She is alert and oriented to person, place, and time. Mental status is at baseline.  Psychiatric:        Mood and Affect: Mood normal.     (all labs ordered are listed, but only abnormal results are displayed) Labs Reviewed  CBC  LIPASE, BLOOD  COMPREHENSIVE METABOLIC PANEL WITH GFR  URINALYSIS, ROUTINE W REFLEX MICROSCOPIC    EKG: None  Radiology: No results found.  {Document cardiac monitor, telemetry assessment procedure when appropriate:32947} Procedures   Medications Ordered in the ED - No data to display    {Click here for ABCD2, HEART and other calculators REFRESH Note before signing:1}                              Medical Decision Making Amount and/or Complexity of Data Reviewed Labs: ordered. Radiology: ordered.  Risk Prescription drug management.   ***Has seen multiple GI doctors and has had an extensive workup for her symptoms including an MRI, multiple CT scans, urea breath test, and unremarkable EGD and colonoscopy and gastric emptying study.  {Document critical care time when appropriate  Document review of labs and clinical decision tools ie CHADS2VASC2, etc  Document your independent review of radiology images and any outside records  Document your discussion with family members, caretakers and with consultants  Document social determinants of health affecting pt's care  Document your decision making why or why not admission, treatments were needed:32947:::1}   Final diagnoses:  None    ED Discharge Orders     None

## 2024-05-02 NOTE — ED Triage Notes (Signed)
 States had oral surgery 2 weeks ago and was given Zofran  which she states she's deathly allergic too. States she has not had any oral intake and has been urinating once a day.   Bright red rectal bleeding starting today,

## 2024-05-02 NOTE — ED Notes (Signed)
 Pt unable to provide urine or stool sample at this time. Bedside commode provided. Family at bedside. Provided water and snacks.

## 2024-05-02 NOTE — ED Notes (Signed)
 Food given per MD verbal order.

## 2024-05-02 NOTE — ED Notes (Signed)
 Xr at bedside

## 2024-05-03 ENCOUNTER — Other Ambulatory Visit: Payer: Self-pay | Admitting: Orthopedic Surgery

## 2024-05-03 MED ORDER — OXYCODONE-ACETAMINOPHEN 5-325 MG PO TABS: ORAL_TABLET | ORAL | 0 refills | Status: DC

## 2024-05-03 NOTE — Telephone Encounter (Signed)
 I called patient and advised.

## 2024-05-03 NOTE — Telephone Encounter (Signed)
 Called patient

## 2024-05-05 ENCOUNTER — Other Ambulatory Visit: Payer: Self-pay | Admitting: Surgical

## 2024-05-05 MED ORDER — HYDROCODONE-ACETAMINOPHEN 5-325 MG PO TABS
1.0000 | ORAL_TABLET | Freq: Three times a day (TID) | ORAL | 0 refills | Status: DC | PRN
Start: 2024-05-05 — End: 2024-05-29

## 2024-05-09 ENCOUNTER — Other Ambulatory Visit: Payer: Self-pay | Admitting: Radiology

## 2024-05-09 DIAGNOSIS — M5410 Radiculopathy, site unspecified: Secondary | ICD-10-CM

## 2024-05-09 DIAGNOSIS — M5416 Radiculopathy, lumbar region: Secondary | ICD-10-CM

## 2024-05-16 ENCOUNTER — Other Ambulatory Visit: Payer: Self-pay

## 2024-05-16 ENCOUNTER — Emergency Department (HOSPITAL_BASED_OUTPATIENT_CLINIC_OR_DEPARTMENT_OTHER)
Admission: EM | Admit: 2024-05-16 | Discharge: 2024-05-16 | Disposition: A | Discharge status: Home / Self Care | Attending: Emergency Medicine | Admitting: Emergency Medicine

## 2024-05-16 ENCOUNTER — Encounter (HOSPITAL_BASED_OUTPATIENT_CLINIC_OR_DEPARTMENT_OTHER): Payer: Self-pay

## 2024-05-16 DIAGNOSIS — R197 Diarrhea, unspecified: Secondary | ICD-10-CM | POA: Diagnosis not present

## 2024-05-16 DIAGNOSIS — R112 Nausea with vomiting, unspecified: Secondary | ICD-10-CM | POA: Diagnosis present

## 2024-05-16 DIAGNOSIS — R109 Unspecified abdominal pain: Secondary | ICD-10-CM | POA: Diagnosis not present

## 2024-05-16 LAB — LIPASE, BLOOD: Lipase: 20 U/L (ref 11–51)

## 2024-05-16 LAB — COMPREHENSIVE METABOLIC PANEL WITH GFR
ALT: 15 U/L (ref 0–44)
AST: 25 U/L (ref 15–41)
Albumin: 4.4 g/dL (ref 3.5–5.0)
Alkaline Phosphatase: 129 U/L — ABNORMAL HIGH (ref 38–126)
Anion gap: 14 (ref 5–15)
BUN: 9 mg/dL (ref 6–20)
CO2: 19 mmol/L — ABNORMAL LOW (ref 22–32)
Calcium: 9.4 mg/dL (ref 8.9–10.3)
Chloride: 102 mmol/L (ref 98–111)
Creatinine, Ser: 0.79 mg/dL (ref 0.44–1.00)
GFR, Estimated: >60 mL/min (ref >60)
Glucose, Bld: 106 mg/dL — ABNORMAL HIGH (ref 70–99)
Potassium: 4.5 mmol/L (ref 3.5–5.1)
Sodium: 136 mmol/L (ref 135–145)
Total Bilirubin: 0.4 mg/dL (ref 0.0–1.2)
Total Protein: 7.1 g/dL (ref 6.5–8.1)

## 2024-05-16 LAB — CBC
HCT: 43.6 % (ref 36.0–46.0)
Hemoglobin: 15.1 g/dL — ABNORMAL HIGH (ref 12.0–15.0)
MCH: 32.1 pg (ref 26.0–34.0)
MCHC: 34.6 g/dL (ref 30.0–36.0)
MCV: 92.8 fL (ref 80.0–100.0)
Platelets: 278 K/uL (ref 150–400)
RBC: 4.7 MIL/uL (ref 3.87–5.11)
RDW: 12.1 % (ref 11.5–15.5)
WBC: 8.4 K/uL (ref 4.0–10.5)
nRBC: 0 % (ref 0.0–0.2)

## 2024-05-16 MED ORDER — OXYCODONE HCL 5 MG PO TABS
5.0000 mg | ORAL_TABLET | Freq: Once | ORAL | Status: AC
  Administered 2024-05-16: 5 mg via ORAL
  Filled 2024-05-16: qty 1

## 2024-05-16 MED ORDER — PROMETHAZINE HCL 25 MG/ML IJ SOLN
INTRAMUSCULAR | Status: DC
Start: 2024-05-16 — End: 2024-05-17
  Filled 2024-05-16: qty 1

## 2024-05-16 MED ORDER — PROMETHAZINE HCL 25 MG RE SUPP: 25.0000 mg | Freq: Four times a day (QID) | RECTAL | 0 refills | Status: DC | PRN

## 2024-05-16 MED ORDER — SODIUM CHLORIDE 0.9 % IV BOLUS
1000.0000 mL | Freq: Once | INTRAVENOUS | Status: AC
  Administered 2024-05-16: 1000 mL via INTRAVENOUS

## 2024-05-16 MED ORDER — PROMETHAZINE HCL 25 MG/ML IJ SOLN
25.0000 mg | Freq: Once | INTRAMUSCULAR | Status: AC
  Administered 2024-05-16: 25 mg via INTRAMUSCULAR

## 2024-05-16 MED ORDER — KETOROLAC TROMETHAMINE 15 MG/ML IJ SOLN
15.0000 mg | Freq: Once | INTRAMUSCULAR | Status: DC
  Filled 2024-05-16: qty 1

## 2024-05-16 NOTE — Discharge Instructions (Signed)
 Please follow with your family doctor and gastroenterologist in the office.  Please return for inability eat or drink

## 2024-05-16 NOTE — ED Provider Notes (Signed)
 [place] EMERGENCY DEPARTMENT AT [facility] Provider Note   CSN: [id number] Arrival date & time: 05/16/24  2027     Patient presents with: Abdominal Pain   Gina [name] is a 48 y.o. female.   48 yo F with a chief complaints of intractable nausea vomiting and diarrhea.  She tells me is been going on for at least 6 months.  She has had to come to the emergency department multiple times.  She feels like it is getting worse over time.  She tells me about a year ago she had had a complete GI workup and was told that her symptoms were not GI related.  She has an appointment for her gastroenterologist but not until October.  She does not currently have a primary care but has an appointment lined up in the next week or 2.  She fell like today whenever she would try to eat or drink she had vomiting.  Seems to occur sometime after her ingestion.  Has abdominal discomfort prior to with an improvement after emesis.  No fevers no recent travel no known sick contacts.   Abdominal Pain      Prior to Admission medications   Medication Sig Start Date End Date Taking? Authorizing Provider  HYDROcodone -acetaminophen  (NORCO/VICODIN) 5-325 MG tablet Take 1 tablet by mouth every 8 (eight) hours as needed for moderate pain (pain score 4-6). 05/05/24   Magnant, Charles L, PA-C  calcium carbonate (TUMS - DOSED IN MG ELEMENTAL CALCIUM) 500 MG chewable tablet Chew 1 tablet by mouth 4 (four) times daily as needed for indigestion or heartburn.    [provider]  Cholecalciferol (VITAMIN D3 PO) Take 2 each by mouth daily. Gummy    [provider]  ELESTRIN 0.52 MG/0.87 GM (0.06%) GEL Apply 1 Application topically every evening. 10/03/23   [provider]  escitalopram  (LEXAPRO ) 20 MG tablet Take 20 mg by mouth at bedtime. 12/20/18   [provider]  LORazepam  (ATIVAN ) 1 MG tablet Take 1 mg by mouth every 8 (eight) hours as needed for anxiety.    [provider]  lurasidone  (LATUDA ) 40 MG TABS tablet Take 40 mg by mouth every evening. 12/16/22   [provider]  promethazine  (PHENERGAN ) 25 MG suppository Place 1 suppository (25 mg total) rectally every 6 (six) hours as needed for nausea or vomiting. 05/16/24   Emil Share, DO    Allergies: Lamictal [lamotrigine], Maxalt  [rizatriptan ], Morphine  and codeine , Nicotine, Zofran , Chantix  [varenicline  tartrate], Ciprofloxacin , Codeine , Haldol  [haloperidol ], Ibuprofen, Lidoderm  [lidocaine ], Skelaxin [metaxalone], Sulfa antibiotics, Sulfasalazine, Toradol  [ketorolac  tromethamine ], Ambien [zolpidem], Doxycycline , Duloxetine, Ingrezza [valbenazine tosylate], Lyrica [pregabalin], Methadone, Neurontin  [gabapentin ], Other, Prednisone , Amoxicillin, Aripiprazole, Chlorhexidine , Penicillins, and Tramadol     Review of Systems  Gastrointestinal:  Positive for abdominal pain.    Updated Vital Signs BP (!) 148/115   Pulse (!) 109   Temp 97.9 F (36.6 C)   Resp 16   LMP 01/01/2004   SpO2 99%   Physical Exam Vitals and nursing note reviewed.  Constitutional:      General: She is not in acute distress.    Appearance: She is well-developed. She is not diaphoretic.  HENT:     Head: Normocephalic and atraumatic.  Eyes:     Pupils: Pupils are equal, round, and reactive to light.  Cardiovascular:     Rate and Rhythm: Normal rate and regular rhythm.     Heart sounds: No murmur heard.    No friction rub. No gallop.  Pulmonary:     Effort: Pulmonary effort is normal.     Breath sounds: No wheezing or rales.  Abdominal:     General: There is no distension.     Palpations: Abdomen is soft.     Tenderness: There is no abdominal tenderness.  Musculoskeletal:        General: No tenderness.     Cervical back: Normal range of motion and neck supple.  Skin:    General: Skin is warm and dry.  Neurological:     Mental Status: She is alert and oriented to person, place, and time.  Psychiatric:         Behavior: Behavior normal.     (all labs ordered are listed, but only abnormal results are displayed) Labs Reviewed  COMPREHENSIVE METABOLIC PANEL WITH GFR - Abnormal; Notable for the following components:      Result Value   CO2 19 (*)    Glucose, Bld 106 (*)    Alkaline Phosphatase 129 (*)    All other components within normal limits  CBC - Abnormal; Notable for the following components:   Hemoglobin 15.1 (*)    All other components within normal limits  LIPASE, BLOOD  URINALYSIS, ROUTINE W REFLEX MICROSCOPIC    EKG: None  Radiology: No results found.   Procedures   Medications Ordered in the ED  ketorolac  (TORADOL ) 15 MG/ML injection 15 mg (15 mg Intravenous Patient Refused/Not Given 05/16/24 2143)  promethazine  (PHENERGAN ) injection 25 mg (0 mg Intramuscular Not Given 05/16/24 2127)  sodium chloride  0.9 % bolus 1,000 mL (1,000 mLs Intravenous New Bag/Given 05/16/24 2122)  oxyCODONE  (Oxy IR/ROXICODONE ) immediate release tablet 5 mg (5 mg Oral Given 05/16/24 2159)                                    Medical Decision Making Amount and/or Complexity of Data Reviewed Labs: ordered.  Risk Prescription drug management.   48 yo F with a chief complaints of nausea vomiting and diarrhea.  This has been going on for at least 6 months she tells me it has also had been seen by GI about a year ago for what sounds like similar symptoms.  She feels like it is getting worse and was seen about 10 days ago in the emergency department setting for the same.  She appears well-hydrated.  No focal abdominal tenderness.  Will attempt to control symptoms here.  Blood work.  Encouraged outpatient follow-up.  Lab work without any significant electrolyte abnormalities.  LFTs and lipase are unremarkable.  No leukocytosis.  The patient had many concerning comments that were consistent with drug-seeking behavior.  I did not feel comfortable giving her IV pain medicine.  She became angry with me  and started yelling at me in the room and told me that I did not care.  I did offer her further antibiotic therapy though she had no episodes of obvious vomiting for me.  Continues to have a benign abdominal exam.  I encouraged her to follow-up as an outpatient.   10:27 PM:  I have discussed the diagnosis/risks/treatment options with the patient.  Evaluation and diagnostic testing in the emergency department does not suggest an emergent condition requiring admission or immediate intervention beyond what has been performed at this time.  They will follow up with PCP. We also discussed returning to the ED immediately if new or worsening sx occur. We discussed the  sx which are most concerning (e.g., sudden worsening pain, fever, inability to tolerate by mouth) that necessitate immediate return. Medications administered to the patient during their visit and any new prescriptions provided to the patient are listed below.  Medications given during this visit Medications  ketorolac  (TORADOL ) 15 MG/ML injection 15 mg (15 mg Intravenous Patient Refused/Not Given 05/16/24 2143)  promethazine  (PHENERGAN ) injection 25 mg (0 mg Intramuscular Not Given 05/16/24 2127)  sodium chloride  0.9 % bolus 1,000 mL (1,000 mLs Intravenous New Bag/Given 05/16/24 2122)  oxyCODONE  (Oxy IR/ROXICODONE ) immediate release tablet 5 mg (5 mg Oral Given 05/16/24 2159)     The patient appears reasonably screen and/or stabilized for discharge and I doubt any other medical condition or other [facility] requiring further screening, evaluation, or treatment in the ED at this time prior to discharge.        Final diagnoses:  Nausea and vomiting in adult    ED Discharge Orders          Ordered    promethazine  (PHENERGAN ) 25 MG suppository  Every 6 hours PRN        05/16/24 2225               Emil Share, DO 05/16/24 2227

## 2024-05-16 NOTE — ED Triage Notes (Signed)
 Pt reports she is here today due to abd pain x3 days. Pt reports she was seen for the same 2 weeks ago. Pt reports diarrhea & vomiting.

## 2024-05-16 NOTE — Telephone Encounter (Signed)
 [name], I am sending a message to Gina [name] about her shoulder flareup.  I also saw in her chart that she is scheduled for a lumbar spine ESI.  Dr. Eldonna did talk with me through epic about not thinking she would get a lot of benefit from L-spine ESI.  Can we cancel that procedure that she has scheduled?  I think if she is still having radicular symptoms, would recommend she have evaluation with neurology first

## 2024-05-17 ENCOUNTER — Other Ambulatory Visit: Payer: Self-pay | Admitting: Family Medicine

## 2024-05-17 DIAGNOSIS — G894 Chronic pain syndrome: Secondary | ICD-10-CM

## 2024-05-18 NOTE — Discharge Instructions (Signed)

## 2024-05-21 ENCOUNTER — Emergency Department (HOSPITAL_COMMUNITY)
Admission: EM | Admit: 2024-05-21 | Discharge: 2024-05-22 | Disposition: A | Discharge status: Home / Self Care | Attending: Emergency Medicine | Admitting: Emergency Medicine

## 2024-05-21 ENCOUNTER — Inpatient Hospital Stay
Admission: RE | Admit: 2024-05-21 | Discharge: 2024-05-21 | Disposition: A | Discharge status: Home / Self Care | Source: Ambulatory Visit | Attending: Surgical | Admitting: Surgical

## 2024-05-21 ENCOUNTER — Ambulatory Visit: Payer: Self-pay

## 2024-05-21 ENCOUNTER — Other Ambulatory Visit: Payer: Self-pay

## 2024-05-21 ENCOUNTER — Emergency Department (HOSPITAL_COMMUNITY)

## 2024-05-21 ENCOUNTER — Encounter (HOSPITAL_COMMUNITY): Payer: Self-pay | Admitting: Emergency Medicine

## 2024-05-21 DIAGNOSIS — R197 Diarrhea, unspecified: Secondary | ICD-10-CM | POA: Diagnosis not present

## 2024-05-21 DIAGNOSIS — R112 Nausea with vomiting, unspecified: Secondary | ICD-10-CM | POA: Insufficient documentation

## 2024-05-21 DIAGNOSIS — R1084 Generalized abdominal pain: Secondary | ICD-10-CM | POA: Insufficient documentation

## 2024-05-21 LAB — COMPREHENSIVE METABOLIC PANEL WITH GFR
ALT: 17 U/L (ref 0–44)
AST: 18 U/L (ref 15–41)
Albumin: 4.1 g/dL (ref 3.5–5.0)
Alkaline Phosphatase: 111 U/L (ref 38–126)
Anion gap: 10 (ref 5–15)
BUN: 10 mg/dL (ref 6–20)
CO2: 23 mmol/L (ref 22–32)
Calcium: 9.5 mg/dL (ref 8.9–10.3)
Chloride: 102 mmol/L (ref 98–111)
Creatinine, Ser: 0.69 mg/dL (ref 0.44–1.00)
GFR, Estimated: >60 mL/min (ref >60)
Glucose, Bld: 96 mg/dL (ref 70–99)
Potassium: 3.7 mmol/L (ref 3.5–5.1)
Sodium: 135 mmol/L (ref 135–145)
Total Bilirubin: 0.7 mg/dL (ref 0.0–1.2)
Total Protein: 7.2 g/dL (ref 6.5–8.1)

## 2024-05-21 LAB — CBC
HCT: 45.4 % (ref 36.0–46.0)
Hemoglobin: 14.9 g/dL (ref 12.0–15.0)
MCH: 31 pg (ref 26.0–34.0)
MCHC: 32.8 g/dL (ref 30.0–36.0)
MCV: 94.4 fL (ref 80.0–100.0)
Platelets: 271 K/uL (ref 150–400)
RBC: 4.81 MIL/uL (ref 3.87–5.11)
RDW: 11.9 % (ref 11.5–15.5)
WBC: 9.7 K/uL (ref 4.0–10.5)
nRBC: 0 % (ref 0.0–0.2)

## 2024-05-21 LAB — LIPASE, BLOOD: Lipase: 28 U/L (ref 11–51)

## 2024-05-21 MED ORDER — IOHEXOL 300 MG/ML  SOLN
100.0000 mL | Freq: Once | INTRAMUSCULAR | Status: AC | PRN
Start: 2024-05-21 — End: 2024-05-21
  Administered 2024-05-21: 100 mL via INTRAVENOUS

## 2024-05-21 MED ORDER — SODIUM CHLORIDE 0.9 % IV SOLN: 12.5000 mg | Freq: Once | INTRAVENOUS | Status: DC

## 2024-05-21 MED ORDER — HYDROMORPHONE HCL 1 MG/ML IJ SOLN
0.5000 mg | Freq: Once | INTRAMUSCULAR | Status: AC
  Administered 2024-05-21: 0.5 mg via INTRAVENOUS
  Filled 2024-05-21: qty 1

## 2024-05-21 MED ORDER — LACTATED RINGERS IV BOLUS
2000.0000 mL | Freq: Once | INTRAVENOUS | Status: AC
  Administered 2024-05-21: 2000 mL via INTRAVENOUS

## 2024-05-21 MED ORDER — PROMETHAZINE (PHENERGAN) 6.25MG IN NS 50ML IVPB
6.2500 mg | Freq: Once | INTRAVENOUS | Status: AC
  Administered 2024-05-22: 6.25 mg via INTRAVENOUS
  Filled 2024-05-21: qty 0.25

## 2024-05-21 MED ORDER — HYDROMORPHONE HCL 1 MG/ML IJ SOLN: 1.0000 mg | Freq: Once | INTRAMUSCULAR | Status: DC

## 2024-05-21 NOTE — ED Triage Notes (Incomplete)
 Patient c/o abdominal pain x 5 days. Patient report worsening LLQ abdominal tonight after eating subs. Patient report nausea and emesis x 4. Patient report 2 episode of watery stools. Patient denies Chest pain and SOB.

## 2024-05-21 NOTE — Telephone Encounter (Signed)
 No sooner appointments available and pt refused ED recommendation d/t her recent experience there.   FYI Only or Action Required?: Action required by provider: update on patient condition.  Patient was last seen in primary care on 04/30/2024 by [name], NP.  Called Nurse Triage reporting No chief complaint on file..  Symptoms began several years ago.  Interventions attempted: Other: See ED notes for tx completed.  Symptoms are: unchanged.  Triage Disposition: See Physician Within 24 Hours - scheduled sooest available appt on 05/23/24.  Patient/caregiver understands and will follow disposition?: Yes   Copied from CRM [id number]. Topic: Clinical - Red Word Triage >> May 21, 2024  3:46 PM Rosina BIRCH wrote: Reason for CRM: Patient can not keep anything down, she has diarrhea and vomiting. Patient has pain in the lower left part of her stomach. Patient states she is in a fetal position. Patient went to the hospital five days ago and they gave her medicine she was allergic to and did not give her an xray Reason for Disposition  [1] MODERATE pain (e.g., interferes with normal activities) AND [2] pain comes and goes (cramps) AND [3] present > 24 hours  (Exception: Pain with Vomiting or Diarrhea - see that Guideline.)  Answer Assessment - Initial Assessment Questions Reviewed ED precautions including worsening pain, vomiting that does not resolve, change in her pain and/or the inability to tolerate food or water. Patient verbalized understanding. She would also like assistance scheduling a sooner GI appt.   1. LOCATION: Where does it hurt?      Lower left abdominal pain 2. RADIATION: Does the pain shoot anywhere else? (e.g., chest, back)     Lower back 3. ONSET: When did the pain begin? (e.g., minutes, hours or days ago)      Chronic/years 4. SUDDEN: Gradual or sudden onset?     Gradual 5. PATTERN Does the pain come and go, or is it constant?     Intermittent 6.  SEVERITY: How bad is the pain?  (e.g., Scale 1-10; mild, moderate, or severe)     10/10 at worst 7. RECURRENT SYMPTOM: Have you ever had this type of stomach pain before? If Yes, ask: When was the last time? and What happened that time?      Yes, chronic. In ED on 05/02/24 and 05/16/24 10. OTHER SYMPTOMS: Do you have any other symptoms? (e.g., back pain, diarrhea, fever, urination pain, vomiting)       Diarrhea - states normal brown in color  Protocols used: Abdominal Pain - [facility]

## 2024-05-21 NOTE — ED Provider Notes (Signed)
 [place] EMERGENCY DEPARTMENT AT [facility] Provider Note   CSN: [id number] Arrival date & time: 05/21/24  2020     Patient presents with: Abdominal Pain   Gina [name] is a 48 y.o. female.  {Add pertinent medical, surgical, social history, OB history to HPI:32947}  Abdominal Pain Associated symptoms: diarrhea, nausea and vomiting   Patient presents for abdominal pain.  Medical history includes GERD, arthritis, bipolar disorder, fibromyalgia, narcotic abuse, migraines.  For the past 2-1/2 weeks, she has had ongoing postprandial pain, nausea, vomiting.  She has also had diarrhea.  She states that she had some red blood in her diarrhea a week ago.  For the past week, it has been watery only.  She takes Norco and Phenergan  at home.  Her symptoms seem to worsen after she eats anything.  She was seen at [facility] 5 days ago for abdominal pain, nausea, vomiting.  She was treated with IV fluids, Phenergan , oxycodone , Toradol .  She has had ongoing pain, vomiting, diarrhea.  Pain is primarily left-sided.  She ate a sandwich today and had acute worsening of her pain.  Patient reports that she was started on a testosterone supplement 2 and half weeks ago.  She does smoke marijuana.     Prior to Admission medications   Medication Sig Start Date End Date Taking? Authorizing Provider  HYDROcodone -acetaminophen  (NORCO/VICODIN) 5-325 MG tablet Take 1 tablet by mouth every 8 (eight) hours as needed for moderate pain (pain score 4-6). 05/05/24   Magnant, Charles L, PA-C  calcium carbonate (TUMS - DOSED IN MG ELEMENTAL CALCIUM) 500 MG chewable tablet Chew 1 tablet by mouth 4 (four) times daily as needed for indigestion or heartburn.    [provider]  Cholecalciferol (VITAMIN D3 PO) Take 2 each by mouth daily. Gummy    [provider]  ELESTRIN 0.52 MG/0.87 GM (0.06%) GEL Apply 1 Application topically every evening. 10/03/23   [provider]  escitalopram   (LEXAPRO ) 20 MG tablet Take 20 mg by mouth at bedtime. 12/20/18   [provider]  LORazepam  (ATIVAN ) 1 MG tablet Take 1 mg by mouth every 8 (eight) hours as needed for anxiety.    [provider]  lurasidone  (LATUDA ) 40 MG TABS tablet Take 40 mg by mouth every evening. 12/16/22   [provider]  methocarbamol  (ROBAXIN ) 500 MG tablet TAKE 1 TABLET BY MOUTH EVERY 8 HOURS AS NEEDED FOR UP TO 7 DAYS 05/17/24   [name], NP  promethazine  (PHENERGAN ) 25 MG suppository Place 1 suppository (25 mg total) rectally every 6 (six) hours as needed for nausea or vomiting. 05/16/24   Emil Share, DO    Allergies: Lamictal [lamotrigine], Maxalt  [rizatriptan ], Morphine  and codeine , Nicotine, Zofran , Chantix  [varenicline  tartrate], Ciprofloxacin , Codeine , Haldol  [haloperidol ], Ibuprofen, Lidoderm  [lidocaine ], Skelaxin [metaxalone], Sulfa antibiotics, Sulfasalazine, Toradol  [ketorolac  tromethamine ], Ambien [zolpidem], Doxycycline , Duloxetine, Ingrezza [valbenazine tosylate], Lyrica [pregabalin], Methadone, Neurontin  [gabapentin ], Other, Prednisone , Amoxicillin, Aripiprazole, Chlorhexidine , Penicillins, and Tramadol     Review of Systems  Gastrointestinal:  Positive for abdominal pain, diarrhea, nausea and vomiting.  All other systems reviewed and are negative.   Updated Vital Signs BP (!) 156/106 (BP Location: Left Arm)   Pulse (!) 108   Temp 98.2 F (36.8 C) (Oral)   Resp 17   Ht 5' 7 (1.702 m)   Wt 106.6 kg   LMP 01/01/2004   SpO2 96%   BMI 36.81 kg/m   Physical Exam Vitals and nursing note reviewed.  Constitutional:  General: She is not in acute distress.    Appearance: She is well-developed. She is not ill-appearing, toxic-appearing or diaphoretic.  HENT:     Head: Normocephalic and atraumatic.     Mouth/Throat:     Mouth: Mucous membranes are moist.  Eyes:     General: No scleral icterus.    Extraocular Movements: Extraocular movements intact.      Conjunctiva/sclera: Conjunctivae normal.  Cardiovascular:     Rate and Rhythm: Normal rate and regular rhythm.  Pulmonary:     Effort: Pulmonary effort is normal. No respiratory distress.  Abdominal:     Palpations: Abdomen is soft.     Tenderness: There is no abdominal tenderness. There is no guarding or rebound.  Musculoskeletal:        General: No swelling.     Cervical back: Neck supple.  Skin:    General: Skin is warm and dry.     Coloration: Skin is not cyanotic, jaundiced or pale.  Neurological:     General: No focal deficit present.     Mental Status: She is alert and oriented to person, place, and time.  Psychiatric:        Mood and Affect: Mood normal.        Behavior: Behavior normal.     (all labs ordered are listed, but only abnormal results are displayed) Labs Reviewed  LIPASE, BLOOD  COMPREHENSIVE METABOLIC PANEL WITH GFR  CBC  URINALYSIS, ROUTINE W REFLEX MICROSCOPIC    EKG: None  Radiology: No results found.  {Document cardiac monitor, telemetry assessment procedure when appropriate:32947} Procedures   Medications Ordered in the ED - No data to display    {Click here for ABCD2, HEART and other calculators REFRESH Note before signing:1}                              Medical Decision Making Amount and/or Complexity of Data Reviewed Labs: ordered.   This patient presents to the ED for concern of ***, this involves an extensive number of treatment options, and is a complaint that carries with it a high risk of complications and morbidity.  The differential diagnosis includes ***   Co morbidities / Chronic conditions that complicate the patient evaluation  ***   Additional history obtained:  Additional history obtained from EMR External records from outside source obtained and reviewed including ***   Lab Tests:  I Ordered, and personally interpreted labs.  The pertinent results include:  ***   Imaging Studies ordered:  I ordered  imaging studies including ***  I independently visualized and interpreted imaging which showed *** I agree with the radiologist interpretation   Cardiac Monitoring: / EKG:  The patient was maintained on a cardiac monitor.  I personally viewed and interpreted the cardiac monitored which showed an underlying rhythm of: ***   Problem List / ED Course / Critical interventions / Medication management  Patient presented for abdominal pain, nausea, vomiting, diarrhea.  Symptoms have been ongoing for the past several weeks.  On arrival in the ED, she is tachycardic and hypertensive.  On exam, she appears uncomfortable.  She describes a generalized abdominal pain that is more prominent on the left side.  Tenderness is present.  IV fluids, antiemetic, and pain medicine were ordered.  Workup was initiated.*** I ordered medication including ***   Reevaluation of the patient after these medicines showed that the patient *** I have reviewed the patients home  medicines and have made adjustments as needed   Consultations Obtained:  I requested consultation with the ***,  and discussed lab and imaging findings as well as pertinent plan - they recommend: ***   Social Determinants of Health:  ***   Test / Admission - Considered:  ***   {Document critical care time when appropriate  Document review of labs and clinical decision tools ie CHADS2VASC2, etc  Document your independent review of radiology images and any outside records  Document your discussion with family members, caretakers and with consultants  Document social determinants of health affecting pt's care  Document your decision making why or why not admission, treatments were needed:32947:::1}   Final diagnoses:  None    ED Discharge Orders     None

## 2024-05-21 NOTE — ED Notes (Signed)
 Pt said she is unable to give a urine sample

## 2024-05-22 ENCOUNTER — Ambulatory Visit: Admitting: Family Medicine

## 2024-05-22 LAB — URINALYSIS, ROUTINE W REFLEX MICROSCOPIC
Bilirubin Urine: NEGATIVE
Glucose, UA: NEGATIVE mg/dL
Hgb urine dipstick: NEGATIVE
Ketones, ur: NEGATIVE mg/dL
Leukocytes,Ua: NEGATIVE
Nitrite: NEGATIVE
Protein, ur: NEGATIVE mg/dL
Specific Gravity, Urine: 1.028 (ref 1.005–1.030)
pH: 6 (ref 5.0–8.0)

## 2024-05-22 MED ORDER — DROPERIDOL 2.5 MG/ML IJ SOLN
0.6250 mg | Freq: Once | INTRAMUSCULAR | Status: AC
  Administered 2024-05-22: 0.625 mg via INTRAVENOUS
  Filled 2024-05-22: qty 2

## 2024-05-22 MED ORDER — ALUM & MAG HYDROXIDE-SIMETH 200-200-20 MG/5ML PO SUSP
30.0000 mL | Freq: Once | ORAL | Status: AC
  Administered 2024-05-22: 30 mL via ORAL
  Filled 2024-05-22: qty 30

## 2024-05-22 MED ORDER — LORAZEPAM 2 MG/ML IJ SOLN
1.0000 mg | Freq: Once | INTRAMUSCULAR | Status: AC
  Administered 2024-05-22: 1 mg via INTRAVENOUS
  Filled 2024-05-22: qty 1

## 2024-05-22 NOTE — Discharge Instructions (Signed)
 Your test results today are reassuring.  Try to drink plenty fluids to stay hydrated.  Avoid marijuana use, as this can cause episodes of severe nausea and abdominal pain in some people.  Follow-up with your outpatient providers.  Return to the emergency department for any new or worsening symptoms of concern.

## 2024-05-22 NOTE — ED Notes (Signed)
 Patient d/c with home care instructions. IV discontinued.

## 2024-05-23 ENCOUNTER — Ambulatory Visit
Admission: RE | Admit: 2024-05-23 | Discharge: 2024-05-23 | Disposition: A | Discharge status: Home / Self Care | Source: Ambulatory Visit | Attending: Family Medicine | Admitting: Family Medicine

## 2024-05-23 ENCOUNTER — Other Ambulatory Visit (INDEPENDENT_AMBULATORY_CARE_PROVIDER_SITE_OTHER)

## 2024-05-23 ENCOUNTER — Encounter: Payer: Self-pay | Admitting: Family Medicine

## 2024-05-23 ENCOUNTER — Ambulatory Visit (INDEPENDENT_AMBULATORY_CARE_PROVIDER_SITE_OTHER): Admitting: Family Medicine

## 2024-05-23 VITALS — BP 122/80 | HR 88 | Resp 12 | Ht 67.0 in | Wt 231.0 lb

## 2024-05-23 DIAGNOSIS — E559 Vitamin D deficiency, unspecified: Secondary | ICD-10-CM

## 2024-05-23 DIAGNOSIS — M79671 Pain in right foot: Secondary | ICD-10-CM

## 2024-05-23 DIAGNOSIS — K582 Mixed irritable bowel syndrome: Secondary | ICD-10-CM

## 2024-05-23 DIAGNOSIS — R202 Paresthesia of skin: Secondary | ICD-10-CM | POA: Diagnosis not present

## 2024-05-23 LAB — VITAMIN D 25 HYDROXY (VIT D DEFICIENCY, FRACTURES): Vit D, 25-Hydroxy: 36 ng/mL (ref 30–100)

## 2024-05-23 LAB — VITAMIN B12: Vitamin B-12: 574 pg/mL (ref 211–911)

## 2024-05-23 NOTE — Patient Instructions (Addendum)
 A few things to remember from today's visit:  Paresthesias - Plan: Vitamin B12  Right foot pain - Plan: DG Foot Complete Right  Irritable bowel syndrome with both constipation and diarrhea  Vitamin D  deficiency, unspecified - Plan: VITAMIN D  25 Hydroxy (Vit-D Deficiency, Fractures)  Please arrange a follow up appt with Gina [name] for 3 months follow up. If foot pain continues you may need to see podiatrist or orthopedist.  Do not use My Chart to request refills or for acute issues that need immediate attention. If you send a my chart message, it may take a few days to be addressed, specially if I am not in the office.  Please be sure medication list is accurate. If a new problem present, please set up appointment sooner than planned today.

## 2024-05-23 NOTE — Progress Notes (Unsigned)
 ACUTE VISIT Chief Complaint  Patient presents with   Hospitalization Follow-up   HPI: [name] is a 48 y.o. female with a PMHx significant for GERD, IBS, Migraine, Tardive D, Anxiety/Depression, Bipolar Dz, Panic Dz w/ Agoraphobia, HSV, Fibromyalgia, Hx of Endometriosis, and Obesity, who is here today complaining of Chronic Abdominal Pain, for which she was recently seen in the ED for on 7/21. She is accompanied by her husband today.  Her PCP is not available today.  Chronic Abdominal Pain:  Associated symptoms of: alternating constipation/diarrhea, most recently has been having more diarrhea for the past few months. Since the start of our records with her in 2013, she has been seen in the ED multiple times for abdominal pain, but most recently on 7/21, 7/16, and 05/02/2024. On review, she has also previously seen GI specialists: Dr. Murriel ([facility]), [facility] in [place] 8/24, and Dr. Winnie [facility]).  - When seen in ED 7/02 per disposition of PA-C Groh and Dr. Murriel as she was having rectal bleeding at that time. Given anti-emetic and analgesic in ED and discharged with Phenergan  suppositories. DG Abdomen: negative - When seen again 7/16 with overall completely unremarkable labs and physical exam; was given IV fluids and Oxycodone  IR, and discharged with Phenergan  suppositories.  - Lastly, when seen on 7/21, labs and EKG all normal. She was given IV fluids for hydration, Dilaudid , Maalox, droperidol  for analgesia, Phenergan  and Ativan  for nausea and was discharged on improvement.  CT Abdomen&Pelvic:  1. No CT evidence for acute intra-abdominal or pelvic abnormality.  2. Hepatic steatosis.  Was seen by DEVONNA Lonni Nakai on 7/21, who prescribed Hyoscyamine  SL, which she says has improved her symptoms to the point she has not had any nausea/vomiting for 24 hours. She follows-up with him in October/2025.   06/13/2003  Sigmoidoscopy: normal examination.  EGD 11/03/2010: Ringed esophagus and large caliber stricture in distal esophagus, s/p dilation. Normal stomach. Normal duodenum. GERD.   Lab Results  Component Value Date   NA 135 05/21/2024   CL 102 05/21/2024   K 3.7 05/21/2024   CO2 23 05/21/2024   BUN 10 05/21/2024   CREATININE 0.69 05/21/2024   GFRNONAA >60 05/21/2024   CALCIUM 9.5 05/21/2024   ALBUMIN 4.1 05/21/2024   GLUCOSE 96 05/21/2024   Lab Results  Component Value Date   WBC 9.7 05/21/2024   HGB 14.9 05/21/2024   HCT 45.4 05/21/2024   MCV 94.4 05/21/2024   PLT 271 05/21/2024   Lab Results  Component Value Date   LIPASE 28 05/21/2024   Lab Results  Component Value Date   ALT 17 05/21/2024   AST 18 05/21/2024   ALKPHOS 111 05/21/2024   BILITOT 0.7 05/21/2024   Distal Lateral Right Foot Pain: Says that she's noticed pain on the right laterality of her right foot for the past week.  Denies recent injury, increased activity,numbness, or tingling. Exacerbated by wearing shoes and walking.  Says pain is improved when she is not wearing shoes, and she has tried Tylenol  for relief.  She would like vit D check, reports hx of vit D deficiency, completed treatment with Ergocalciferol  50,000 U weekly.  Review of Systems  Constitutional:  Negative for activity change, appetite change and fever.  HENT:  Negative for sore throat and trouble swallowing.   Eyes:  Negative for redness and visual disturbance.  Respiratory:  Negative for cough, shortness of breath and wheezing.   Cardiovascular:  Negative for  chest pain, palpitations and leg swelling.  Gastrointestinal:  Negative for abdominal pain, nausea and vomiting.  Endocrine: Negative for cold intolerance and heat intolerance.  Genitourinary:  Negative for decreased urine volume, dysuria and hematuria.  Musculoskeletal:  Positive for arthralgias.  Skin:  Negative for rash.  Neurological:  Negative for syncope and weakness.  See  other pertinent positives and negatives in HPI.  Current Outpatient Medications on File Prior to Visit  Medication Sig Dispense Refill   calcium carbonate (TUMS - DOSED IN MG ELEMENTAL CALCIUM) 500 MG chewable tablet Chew 1 tablet by mouth 4 (four) times daily as needed for indigestion or heartburn.     Cholecalciferol (VITAMIN D3 PO) Take 2 each by mouth daily. Gummy     ELESTRIN 0.52 MG/0.87 GM (0.06%) GEL Apply 1 Application topically every evening.     escitalopram  (LEXAPRO ) 20 MG tablet Take 20 mg by mouth at bedtime.     HYDROcodone -acetaminophen  (NORCO/VICODIN) 5-325 MG tablet Take 1 tablet by mouth every 8 (eight) hours as needed for moderate pain (pain score 4-6). 30 tablet 0   hyoscyamine  (LEVSIN  SL) 0.125 MG SL tablet Place 0.125 mg under the tongue every 4 (four) hours as needed for cramping.     LORazepam  (ATIVAN ) 1 MG tablet Take 1 mg by mouth every 8 (eight) hours as needed for anxiety.     lurasidone  (LATUDA ) 40 MG TABS tablet Take 40 mg by mouth every evening.     methocarbamol  (ROBAXIN ) 500 MG tablet TAKE 1 TABLET BY MOUTH EVERY 8 HOURS AS NEEDED FOR UP TO 7 DAYS 21 tablet 0   promethazine  (PHENERGAN ) 25 MG suppository Place 1 suppository (25 mg total) rectally every 6 (six) hours as needed for nausea or vomiting. 12 each 0   No current facility-administered medications on file prior to visit.   Past Medical History:  Diagnosis Date   ABDOMINAL WALL HERNIA 02/27/2010   Abnormal Pap smear    ALLERGIC RHINITIS 06/02/2007   ANOREXIA, CHRONIC 08/07/2008   ANXIETY 09/15/2010   Arthritis    BACK PAIN, THORACIC REGION 07/07/2007   Bipolar disorder (HCC)    CERVICAL RADICULOPATHY 12/11/2008   Complication of anesthesia    Pateint reprots a high tolerance   Complication of anesthesia    pt states she has not had her voice completely back since surgery in Nov. 2024   Condyloma acuminatum 04/23/2009   CONSTIPATION 09/15/2010   DYSPHAGIA UNSPECIFIED 09/09/2009   Dysthymic  disorder 06/20/2009   Eating disorder    ENDOMETRIOSIS 12/16/2009   FATIGUE 06/11/2010   Fibromyalgia    GERD 10/23/2009   Heart palpitations    HERPES SIMPLEX INFECTION 06/11/2010   LENTIGO 04/23/2009   LUNG NODULE 06/17/2010   Narcotic abuse, continuous (HCC)    pt denies   Ovarian cyst    Palpitations 10/23/2009   Panic disorder    Rheumatoid arthritis(714.0)    Stricture and stenosis of esophagus 05/13/2010   TOBACCO ABUSE 08/07/2008   Urinary tract infection    UTI (urinary tract infection)    Allergies  Allergen Reactions   Lamictal [Lamotrigine] Anaphylaxis and Other (See Comments)    STEVEN JOHNSON'S SYNDROME   Maxalt  [Rizatriptan ] Anaphylaxis   Morphine  And Codeine  Other (See Comments)    Makes pt very mean I get mean Makes pt very mean Makes pt very mean   Nicotine Swelling and Palpitations    Other reaction(s): Respiratory Distress (ALLERGY/intolerance), Swelling (ALLERGY/intolerance), Tachycardia / Palpitations  (intolerance) Patches, lozenges Patches caused palpations  lozenges throat swelled   Zofran  Anaphylaxis   Chantix  [Varenicline  Tartrate] Other (See Comments)     Diaphoresis, sweating.  Night terrors.  went crazy   Ciprofloxacin  Diarrhea and Nausea And Vomiting   Codeine  Nausea And Vomiting   Haldol  [Haloperidol ] Itching, Rash and Other (See Comments)    FLUSHING   Ibuprofen Nausea And Vomiting   Lidoderm  [Lidocaine ] Other (See Comments)    DOESN'T WORK FOR PATIENT   Skelaxin [Metaxalone] Other (See Comments)    Pt does not remember reaction, REAL BAD REACTION   Sulfa Antibiotics Diarrhea, Other (See Comments) and Nausea And Vomiting    GI PAINS ALSO GI PAINS ALSO    Sulfasalazine Diarrhea    GI PAINS   Toradol  [Ketorolac  Tromethamine ] Nausea And Vomiting   Ambien [Zolpidem] Other (See Comments)    Mental status changes   Doxycycline      Other Reaction(s): GI Intolerance   Duloxetine Other (See Comments)    Unknown reaction    Ingrezza [Valbenazine Tosylate] Hives   Lyrica [Pregabalin] Other (See Comments)    Unknown reaction type   Methadone     seizures   Neurontin  [Gabapentin ] Nausea And Vomiting   Other     Pt states her body does not react well to the new type of prep must use betadine   Prednisone  Other (See Comments)    Interaction to other medications   Amoxicillin Rash    Has patient had a PCN reaction causing immediate rash, facial/tongue/throat swelling, SOB or lightheadedness with hypotension: No Has patient had a PCN reaction causing severe rash involving mucus membranes or skin necrosis: No Has patient had a PCN reaction that required hospitalization: No Has patient had a PCN reaction occurring within the last 10 years: No If all of the above answers are NO, then may proceed with Cephalosporin use.    Aripiprazole Anxiety    hallucinations   Chlorhexidine  Itching and Rash   Penicillins Rash    Other reaction(s): Urticaria / Hives (ALLERGY) Has patient had a PCN reaction causing immediate rash, facial/tongue/throat swelling, SOB or lightheadedness with hypotension: No Has patient had a PCN reaction causing severe rash involving mucus membranes or skin necrosis: No Has patient had a PCN reaction that required hospitalization: No Has patient had a PCN reaction occurring within the last 10 years: No If all of the above answers are NO, then may proceed with Cephalosporin use.   Tramadol  Rash    Social History   Socioeconomic History   Marital status: Married    Spouse name: Not on file   Number of children: 2   Years of education: Not on file   Highest education level: Master's degree (e.g., MA, MS, MEng, MEd, MSW, MBA)  Occupational History   Not on file  Tobacco Use   Smoking status: Every Day    Current packs/day: 0.20    Types: Cigarettes   Smokeless tobacco: Never   Tobacco comments:    2-3 cigarettes per day as of May 2025  Vaping Use   Vaping status: Every Day   Substance and Sexual Activity   Alcohol use: No   Drug use: No   Sexual activity: Yes    Birth control/protection: Surgical  Other Topics Concern   Not on file  Social History Narrative   Not on file   Social Drivers of Health   Financial Resource Strain: Low Risk  (04/30/2024)   Overall Financial Resource Strain (CARDIA)    Difficulty of Paying Living Expenses:  Not hard at all  Food Insecurity: No Food Insecurity (09/27/2023)   Hunger Vital Sign    Worried About Running Out of Food in the Last Year: Never true    Ran Out of Food in the Last Year: Never true  Transportation Needs: No Transportation Needs (09/27/2023)   PRAPARE - Administrator, Civil Service (Medical): No    Lack of Transportation (Non-Medical): No  Physical Activity: Inactive (04/30/2024)   Exercise Vital Sign    Days of Exercise per Week: 0 days    Minutes of Exercise per Session: 0 min  Stress: No Stress Concern Present (04/30/2024)   [employer] of Occupational Health - Occupational Stress Questionnaire    Feeling of Stress: Only a little  Social Connections: Moderately Integrated (04/30/2024)   Social Connection and Isolation Panel    Frequency of Communication with Friends and Family: Three times a week    Frequency of Social Gatherings with Friends and Family: Twice a week    Attends Religious Services: More than 4 times per year    Active Member of Clubs or Organizations: No    Attends Banker Meetings: Never    Marital Status: Married    Vitals:   05/23/24 1455  BP: 122/80  Pulse: 88  Resp: 12  SpO2: 98%   Body mass index is 36.18 kg/m.  Physical Exam Vitals and nursing note reviewed.  Constitutional:      General: She is not in acute distress.    Appearance: She is well-developed. She is not ill-appearing.  HENT:     Head: Normocephalic and atraumatic.  Eyes:     Conjunctiva/sclera: Conjunctivae normal.  Cardiovascular:     Rate and Rhythm: Normal rate  and regular rhythm.     Pulses:          Dorsalis pedis pulses are 2+ on the right side and 2+ on the left side.     Heart sounds: No murmur heard. Pulmonary:     Effort: Pulmonary effort is normal. No respiratory distress.     Breath sounds: Normal breath sounds.  Abdominal:     Palpations: Abdomen is soft. There is no mass.     Tenderness: There is no abdominal tenderness.  Musculoskeletal:     Right foot: Normal capillary refill. Swelling and tenderness present. No deformity. Normal pulse.       Feet:  Feet:     Comments: Pain with pressure applied to 4th metatarsal. Mild swelling of right foot noticeable compared to left foot. Skin:    General: Skin is warm.     Findings: No erythema or rash.  Neurological:     General: No focal deficit present.     Mental Status: She is alert and oriented to person, place, and time.     Cranial Nerves: No cranial nerve deficit.     Comments: Patient noted decreased sensations right LE from pretibial area to foot. On sensation testing with monofilament and tuning fork patient noted sensation mildly decreased left foot and normal vibration.   Antalgic gait, not assisted.   Psychiatric:        Mood and Affect: Mood and affect normal.    ASSESSMENT AND PLAN: Ms.Jatara Eathel Pajak was seen here today for ED Follow-up, Chronic Abdominal Pain and other concerns.   Lab Results  Component Value Date   VITAMINB12 574 05/23/2024   Lab Results  Component Value Date   VD25OH 36 05/23/2024   Paresthesias Reports decreased  sensation during examination of distal RLE (pretibial) today, no history of neuropathy. Weakness not appreciated on examination today. Could be related with fibromyalgia and/or radiculopathy. B12 checked today. Monitor for new symptoms. She has seen neurology in the past, last visit in 12/2022.  Recommend follow-up with PCP in 3 months with PCP.  -     Vitamin B12; Future -     VITAMIN D  25 Hydroxy (Vit-D Deficiency,  Fractures); Future  Right foot pain Very tender with palpation of 5th metatarsus and 4th MTP joint. We discussed possible etiologies, tenosynovitis and stress fracture to be considered. Recommend foot x-ray. If problem is persistent, she can follow-up with her orthopedist or arrange appointment with podiatrist. She does not tolerate NSAIDs, so she can try topical IcyHot or ice Aspercreme with lidocaine .  -     DG Foot Complete Right; Future  Irritable bowel syndrome with both constipation and diarrhea Mostly diarrhea for the past few months. Following with gastroenterologist. Started hyoscyamine  0.125 mg tid yesterday and has noticed great improvement of GI symptoms.  Vitamin D  deficiency, unspecified Reported by patient. Currently she is not on vitamin D  supplementation. Further recommendation will be given according to 25 OH vitamin D  result.  -     VITAMIN D  25 Hydroxy (Vit-D Deficiency, Fractures); Future  Of bone record reviewed she has seen neurologist for lumbar radiculopathy. Brain MRI on 03/25/2024 due to neurodeficit acute, stroke suspected. No acute intracranial abnormality. According to records it was done because of sudden onset of left-sided heaviness, dizziness, numbness, paresthesias. Lumbar MRI in 07/2023: No acute findings.  Stable spondylosis at L1-2 without resulting spinal stenosis or nerve root encroachment.  I spent a total of 36 minutes in both face to face and non face to face activities for this visit on the date of this encounter. During this time history was obtained and documented, examination was performed, prior labs/imaging reviewed, and assessment/plan discussed.  Return in about 3 months (around 08/23/2024) for with PCP, chronic problems.  I,Emily Lagle,acting as a Neurosurgeon for Tillmon Kisling [place], MD.,have documented all relevant documentation on the behalf of Lanitra Battaglini [place], MD,as directed by  Doris Mcgilvery [place], MD while in the presence of Kameron Glazebrook [place], MD.  I,  Floris Neuhaus [place], MD, have reviewed all documentation for this visit. The documentation on 05/23/24 for the exam, diagnosis, procedures, and orders are all accurate and complete. Tavaris Eudy G. [place], MD  [facility]. [place] office.

## 2024-05-24 ENCOUNTER — Telehealth: Payer: Self-pay | Admitting: *Deleted

## 2024-05-24 ENCOUNTER — Ambulatory Visit: Payer: Self-pay | Admitting: Family Medicine

## 2024-05-24 NOTE — Telephone Encounter (Signed)
 Copied from CRM #[id number]. Topic: Clinical - Lab/Test Results >> May 24, 2024  3:08 PM Harlene ORN wrote: Reason for CRM: Patient called to follow up on her x-ray results.

## 2024-05-25 ENCOUNTER — Ambulatory Visit (INDEPENDENT_AMBULATORY_CARE_PROVIDER_SITE_OTHER): Admitting: Surgical

## 2024-05-25 DIAGNOSIS — S86309A Unspecified injury of muscle(s) and tendon(s) of peroneal muscle group at lower leg level, unspecified leg, initial encounter: Secondary | ICD-10-CM | POA: Diagnosis not present

## 2024-05-27 ENCOUNTER — Encounter: Payer: Self-pay | Admitting: Surgical

## 2024-05-27 NOTE — Progress Notes (Signed)
 Office Visit Note   Patient: Gina [name]           Date of Birth: September 17, 1976           MRN: [id number] Visit Date: 05/25/2024 Requested by: [name], NP [address] [place],  [place] [address] PCP: [name], NP  Subjective: Chief Complaint  Patient presents with   Left Foot - Pain   Other     left shoulder arthroscopy with distal clavicle excision on 03/29/2024    HPI: Gina [name] is a 48 y.o. female who presents to the office reporting right foot pain.  Patient states she has had pain for about a week without any history of injury.  She describes pain primarily in the lateral aspect of her foot with difficulty ambulating due to pain.  She has swelling on the lateral aspect of her foot.  Pain will keep her up at night.  She denies any mechanical symptoms aside from occasional popping sensation in the lateral ankle/foot.  She has tried ice, heat, taking Norco without much relief.  She does have history of tardive dyskinesia and says she is constantly everting and inverting her ankle as a result.  She has not tried different shoewear.  She is concerned about possibility of stress fracture.  She does have history of osteoporosis according to her but no DEXA scan is available for review.  She has had recent foot radiographs at [facility] that have not been read yet...                ROS: All systems reviewed are negative as they relate to the chief complaint within the history of present illness.  Patient denies fevers or chills.  Assessment & Plan: Visit Diagnoses: No diagnosis found.  Plan: Patient is a 48 year old female who presents for evaluation of right foot pain.  She has pain in the right foot that has been present only for about 1 week.  Radiographs of her right foot were reviewed demonstrating no evidence of acute osseous abnormality.  She had ultrasound evaluation today with no regions of soft tissue swelling around any of the metatarsal  shafts but there was some small amount of hypoechoic fluid noted at the peroneal brevis attachment to the fifth metatarsal base.  There is still tendinous attachment noted on ultrasound and her eversion function is painful but intact.  She also has a small amount of fluid in the tendon sheath of the peroneal tendons.  We discussed options available to patient.  Plan to try boot immobilization for 3 weeks and see her back.  If no improvement, next step would be MRI scan.  Some of this pathology may be due to her tardive dyskinesia that is constantly making her evert/invert her ankle repeatedly or this could just be degenerative with her lack of injury history..  Follow-Up Instructions: Return in about 3 weeks (around 06/15/2024).   Orders:  No orders of the defined types were placed in this encounter.  No orders of the defined types were placed in this encounter.     Procedures: No procedures performed   Clinical Data: No additional findings.  Objective: Vital Signs: LMP 01/01/2004   Physical Exam:  Constitutional: Patient appears well-developed HEENT:  Head: Normocephalic Eyes:EOM are normal Neck: Normal range of motion Cardiovascular: Normal rate Pulmonary/chest: Effort normal Neurologic: Patient is alert Skin: Skin is warm Psychiatric: Patient has normal mood and affect  Ortho Exam: Ortho exam demonstrates right foot with  mild swelling over the fifth metatarsal base of the right foot relative to the left.  There is palpable DP pulse of bilateral lower extremities.  She has intact ankle dorsiflexion, plantarflexion, inversion, eversion.  Eversion does reproduce her lateral sided pain.  She has point tenderness over the fifth metatarsal base and somewhat diffusely throughout multiple metatarsal midshaft.  No cellulitis or skin changes noted.  No calf tenderness.  Negative Homans' sign.  She has no tenderness over the lateral malleolus, medial malleolus, ankle joint line.  Mild  tenderness over the course of the peroneal tendons with more moderate tenderness specifically where the peroneal tendons crossed underneath the lateral malleolus.  Specialty Comments:  Narrative & Impression CLINICAL DATA:  Low back pain, prior surgery, new symptoms. Ongoing low back pain for 2 months. No known injury. No prior surgery to area.   EXAM: MRI LUMBAR SPINE WITHOUT AND WITH CONTRAST   TECHNIQUE: Multiplanar and multiecho pulse sequences of the lumbar spine were obtained without and with intravenous contrast.   CONTRAST:  10mL GADAVIST  GADOBUTROL  1 MMOL/ML IV SOLN   COMPARISON:  Lumbar MRI 04/02/2018. CT of the abdomen and pelvis 06/15/2023. Lumbar spine radiographs 09/18/2018.   FINDINGS: Segmentation: Conventional anatomy assumed, with the last open disc space designated L5-S1.Concordant with prior imaging.   Alignment: Mild convex right scoliosis. No focal angulation or listhesis.   Vertebrae: No worrisome osseous lesion, acute fracture or pars defect. Chronic Schmorl's node in the inferior endplate of L1. No evidence of discitis or osteomyelitis. No obvious postsurgical changes in the lumbar spine.   Conus medullaris: Extends to the L1-2 level and appears normal. No abnormal intradural enhancement.   Paraspinal and other soft tissues: No significant paraspinal findings.   Disc levels:   Sagittal images demonstrate no significant disc space findings within the visualized lower thoracic spine.   L1-2: Stable loss of disc height with disc bulging and endplate osteophytes. No spinal stenosis or nerve root encroachment.   L2-3: Normal interspace.   L3-4: Normal interspace.   L4-5: Normal interspace.   L5-S1: Normal interspace.   IMPRESSION: 1. No acute findings or explanation for the patient's symptoms. 2. Stable spondylosis at L1-2 without resulting spinal stenosis or nerve root encroachment.     Electronically Signed   By: Elsie Perone  M.D.   On: 07/29/2023 18:36  Imaging: No results found.   PMFS History: Patient Active Problem List   Diagnosis Date Noted   Arthritis of left acromioclavicular joint 04/14/2024   Chronic pain in left shoulder 11/01/2023   Calcific supraspinatus tendonitis 10/03/2023   Bursitis of right shoulder 10/03/2023   Arthritis of right acromioclavicular joint 10/03/2023   S/P arthroscopy of right shoulder 09/27/2023   Persistent headaches 01/18/2023   Blurry vision 01/18/2023   Weight gain 01/18/2023   SBO (small bowel obstruction) (HCC) 09/13/2022   Nausea and vomiting 09/11/2022   Partial small bowel obstruction (HCC) 09/10/2022   Piriformis syndrome of both sides 05/31/2019   History of bipolar disorder 04/18/2019   Tardive dyskinesia 04/18/2019   Urticaria due to drug allergy 04/18/2019   Lumbar spondylosis 02/05/2019   Bipolar affective disorder, current episode mixed (HCC) 01/02/2019   Arthritis 01/02/2019   Cigarette nicotine dependence without complication 01/02/2019   Bilateral temporomandibular joint pain 10/02/2018   Referred otalgia, bilateral 10/02/2018   Paresthesias 03/09/2018   Chronic migraine without aura, intractable, with status migrainosus 04/22/2016   Bipolar 1 disorder, mixed, moderate (HCC) 07/15/2015    Class: Chronic  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   Sciatica 01/09/2015   Reactive hypoglycemia 12/17/2013   Musculoskeletal malfunction arising from mental factors 04/04/2013   Incisional hernia 11/15/2012   Other postprocedural status(V45.89) 11/15/2012   S/P hernia surgery 11/15/2012   Abdominal pain 11/11/2012   Chronic pain syndrome 11/11/2012   Disorder of sacrum 11/11/2012   Chronic pain 12/21/2011   Heartburn 10/04/2011   DYSPHAGIA 10/04/2011   Depression with anxiety 09/15/2010   CONSTIPATION 09/15/2010   LUNG NODULE 06/17/2010   Herpes simplex virus (HSV) infection 06/11/2010   FATIGUE 06/11/2010   STRICTURE AND  STENOSIS OF ESOPHAGUS 05/13/2010   ABDOMINAL WALL HERNIA 02/27/2010   ENDOMETRIOSIS 12/16/2009   GERD 10/23/2009   PALPITATIONS 10/23/2009   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   DYSTHYMIC DISORDER 06/20/2009   CONDYLOMA ACUMINATUM 04/23/2009   LENTIGO 04/23/2009   DENTAL PAIN 03/19/2009   CERVICAL RADICULOPATHY 12/11/2008   Cervical radiculopathy 12/11/2008   PANIC DISORDER WITHOUT AGORAPHOBIA 08/07/2008   TOBACCO ABUSE 08/07/2008   BACK PAIN, THORACIC REGION 07/07/2007   ALLERGIC RHINITIS 06/02/2007   Past Medical History:  Diagnosis Date   ABDOMINAL WALL HERNIA 02/27/2010   Abnormal Pap smear    ALLERGIC RHINITIS 06/02/2007   ANOREXIA, CHRONIC 08/07/2008   ANXIETY 09/15/2010   Arthritis    BACK PAIN, THORACIC REGION 07/07/2007   Bipolar disorder (HCC)    CERVICAL RADICULOPATHY 12/11/2008   Complication of anesthesia    Pateint reprots a high tolerance   Complication of anesthesia    pt states she has not had her voice completely back since surgery in Nov. 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   Eating disorder    ENDOMETRIOSIS 12/16/2009   FATIGUE 06/11/2010   Fibromyalgia    GERD 10/23/2009   Heart palpitations    HERPES SIMPLEX INFECTION 06/11/2010   LENTIGO 04/23/2009   LUNG NODULE 06/17/2010   Narcotic abuse, continuous (HCC)    pt denies   Ovarian cyst    Palpitations 10/23/2009   Panic disorder    Rheumatoid arthritis(714.0)    Stricture and stenosis of esophagus 05/13/2010   TOBACCO ABUSE 08/07/2008   Urinary tract infection    UTI (urinary tract infection)     Family History  Problem Relation Age of Onset   Depression Mother    Arthritis Mother    Hyperlipidemia Father    Hypertension Father     Past Surgical History:  Procedure Laterality Date   ABDOMINAL SURGERY     ABDOMINAL WALL MESH  REMOVAL     ABLATION ON ENDOMETRIOSIS     CESAREAN  SECTION     x2    CYSTOSCOPY N/A 11/22/2023   Procedure: CYSTOSCOPY;  Surgeon: Elisabeth Valli BIRCH, MD;  Location: [facility];  Service: Urology;  Laterality: N/A;   DILATION AND CURETTAGE OF UTERUS     x1    ENDOMETRIAL ABLATION     esophageal dilatation x 4     GUM SURGERY     HERNIA REPAIR     X3   PARTIAL HYSTERECTOMY     POSTERIOR LUMBAR FUSION 2 WITH HARDWARE REMOVAL Left 03/29/2024   Procedure: ARTHROSCOPY, SHOULDER WITH DEBRIDEMENT;  Surgeon: Addie Cordella Hamilton, MD;  Location: [facility] OR;  Service: Orthopedics;  Laterality: Left;  LEFT SHOULDER ARTHROSCOPY, DEBRIDEMENT, DISTAL CLAVICLE EXCISION   RESECTION DISTAL CLAVICAL Left 03/29/2024   Procedure: EXCISION, CLAVICLE, DISTAL, OPEN;  Surgeon: Addie,  Cordella Hamilton, MD;  Location: [facility] OR;  Service: Orthopedics;  Laterality: Left;   SHOULDER SURGERY Right 09/2023   Social History   Occupational History   Not on file  Tobacco Use   Smoking status: Every Day    Current packs/day: 0.20    Types: Cigarettes   Smokeless tobacco: Never   Tobacco comments:    2-3 cigarettes per day as of May 2025  Vaping Use   Vaping status: Every Day  Substance and Sexual Activity   Alcohol use: No   Drug use: No   Sexual activity: Yes    Birth control/protection: Surgical

## 2024-05-28 ENCOUNTER — Telehealth: Payer: Self-pay | Admitting: Surgical

## 2024-05-28 NOTE — Telephone Encounter (Signed)
 Pt called requesting refill of hydrocodone . Please send to [facility]. Pt phone number is [phone].

## 2024-05-29 ENCOUNTER — Other Ambulatory Visit: Payer: Self-pay | Admitting: Surgical

## 2024-05-29 MED ORDER — HYDROCODONE-ACETAMINOPHEN 5-325 MG PO TABS: 1.0000 | ORAL_TABLET | Freq: Two times a day (BID) | ORAL | 0 refills | Status: DC | PRN

## 2024-05-29 NOTE — Telephone Encounter (Signed)
 Refilled norco.

## 2024-05-30 NOTE — Telephone Encounter (Signed)
 I called patient and advised.

## 2024-06-11 ENCOUNTER — Ambulatory Visit (HOSPITAL_BASED_OUTPATIENT_CLINIC_OR_DEPARTMENT_OTHER): Admitting: Physician Assistant

## 2024-06-11 ENCOUNTER — Ambulatory Visit (INDEPENDENT_AMBULATORY_CARE_PROVIDER_SITE_OTHER)

## 2024-06-11 ENCOUNTER — Encounter (HOSPITAL_BASED_OUTPATIENT_CLINIC_OR_DEPARTMENT_OTHER): Payer: Self-pay | Admitting: Physician Assistant

## 2024-06-11 DIAGNOSIS — M79672 Pain in left foot: Secondary | ICD-10-CM

## 2024-06-11 MED ORDER — METHYLPREDNISOLONE 4 MG PO TBPK
ORAL_TABLET | ORAL | 0 refills | Status: DC
Start: 2024-06-11 — End: 2024-07-04

## 2024-06-11 NOTE — Progress Notes (Signed)
 Office Visit Note   Patient: Gina [name]           Date of Birth: 09/03/1976           MRN: [id number] Visit Date: 06/11/2024              Requested by: [name], NP [address] [place],  [place] [address] PCP: [name], NP  Chief Complaint  Patient presents with   Left Foot - Pain      HPI: Patient is a pleasant 48 year old woman who is a patient of Dr. Addie and Brandy.  She comes in today with a severe pain and paresthesias in neuropathic pain on the top of her left foot.  Started yesterday no particular injury.  She was seen by Herlene who did an ultrasound on her other foot a couple weeks ago she was told there was fluid in the tendons.  She was put in a boot on that side.  She said this is much worse.  Assessment & Plan: Visit Diagnoses:  1. Left foot pain     Plan: I explained to the patient I do not have access and ability for an ultrasound.  I do not see any red flags she is very hesitant to move her foot and is tender even to light touch.  Very difficult for her to plantarflex dorsiflex evert or invert secondary to pain.  Have recommended that she put the boot on the other foot as she would like to do that.  I have also placed her on a Medrol  Dosepak.  Could follow-up with Dr. Addie or Herlene in a couple weeks  Follow-Up Instructions: No follow-ups on file.   Ortho Exam  Patient is alert, oriented, no adenopathy, well-dressed, normal affect, normal respiratory effort. Examination of her left ankle she is neurovascularly intact compartments are soft and nontender she is extremely tender even to light touch over the dorsum of her foot.  Pulse is palpable she has brisk capillary refill.  She has limited range of motion secondary to pain no tenderness particularly over the plantar surface of her foot    Imaging: No results found. No images are attached to the encounter.  Labs: Lab Results  Component Value Date   ESRSEDRATE 3  02/13/2019   ESRSEDRATE 6 06/11/2010   CRP 1.5 (H) 02/13/2019   REPTSTATUS 01/10/2021 FINAL 01/06/2021   CULT (A) 01/06/2021    <10,000 COLONIES/mL INSIGNIFICANT GROWTH Performed at [facility], [address], [place], [place] [address]      Lab Results  Component Value Date   ALBUMIN 4.1 05/21/2024   ALBUMIN 4.4 05/16/2024   ALBUMIN 4.0 05/02/2024   PREALBUMIN 34.0 08/08/2008    Lab Results  Component Value Date   MG 2.1 04/14/2023   MG 1.9 05/25/2010   Lab Results  Component Value Date   VD25OH 36 05/23/2024    Lab Results  Component Value Date   PREALBUMIN 34.0 08/08/2008      Latest Ref Rng & Units 05/21/2024    8:45 PM 05/16/2024    8:41 PM 05/02/2024    6:27 PM  CBC EXTENDED  WBC 4.0 - 10.5 K/uL 9.7  8.4  8.7   RBC 3.87 - 5.11 MIL/uL 4.81  4.70  4.58   Hemoglobin 12.0 - 15.0 g/dL 85.0  84.8  85.2   HCT 36.0 - 46.0 % 45.4  43.6  42.5   Platelets 150 - 400 K/uL 271  278  266      There is no height or weight on file to calculate BMI.  Orders:  Orders Placed This Encounter  Procedures   DG Foot Complete Left   MR FOOT LEFT WO CONTRAST   Meds ordered this encounter  Medications   methylPREDNISolone  (MEDROL  DOSEPAK) 4 MG TBPK tablet    Sig: Take as directed with food    Dispense:  21 tablet    Refill:  0     Procedures: No procedures performed  Clinical Data: No additional findings.  ROS:  All other systems negative, except as noted in the HPI. Review of Systems  Objective: Vital Signs: LMP 01/01/2004   Specialty Comments:  Narrative & Impression CLINICAL DATA:  Low back pain, prior surgery, new symptoms. Ongoing low back pain for 2 months. No known injury. No prior surgery to area.   EXAM: MRI LUMBAR SPINE WITHOUT AND WITH CONTRAST   TECHNIQUE: Multiplanar and multiecho pulse sequences of the lumbar spine were obtained without and with intravenous contrast.   CONTRAST:  10mL GADAVIST  GADOBUTROL  1 MMOL/ML IV SOLN    COMPARISON:  Lumbar MRI 04/02/2018. CT of the abdomen and pelvis 06/15/2023. Lumbar spine radiographs 09/18/2018.   FINDINGS: Segmentation: Conventional anatomy assumed, with the last open disc space designated L5-S1.Concordant with prior imaging.   Alignment: Mild convex right scoliosis. No focal angulation or listhesis.   Vertebrae: No worrisome osseous lesion, acute fracture or pars defect. Chronic Schmorl's node in the inferior endplate of L1. No evidence of discitis or osteomyelitis. No obvious postsurgical changes in the lumbar spine.   Conus medullaris: Extends to the L1-2 level and appears normal. No abnormal intradural enhancement.   Paraspinal and other soft tissues: No significant paraspinal findings.   Disc levels:   Sagittal images demonstrate no significant disc space findings within the visualized lower thoracic spine.   L1-2: Stable loss of disc height with disc bulging and endplate osteophytes. No spinal stenosis or nerve root encroachment.   L2-3: Normal interspace.   L3-4: Normal interspace.   L4-5: Normal interspace.   L5-S1: Normal interspace.   IMPRESSION: 1. No acute findings or explanation for the patient's symptoms. 2. Stable spondylosis at L1-2 without resulting spinal stenosis or nerve root encroachment.     Electronically Signed   By: Elsie Perone M.D.   On: 07/29/2023 18:36  PMFS History: Patient Active Problem List   Diagnosis Date Noted   Arthritis of left acromioclavicular joint 04/14/2024   Chronic pain in left shoulder 11/01/2023   Calcific supraspinatus tendonitis 10/03/2023   Bursitis of right shoulder 10/03/2023   Arthritis of right acromioclavicular joint 10/03/2023   S/P arthroscopy of right shoulder 09/27/2023   Persistent headaches 01/18/2023   Blurry vision 01/18/2023   Weight gain 01/18/2023   SBO (small bowel obstruction) (HCC) 09/13/2022   Nausea and vomiting 09/11/2022   Partial small bowel obstruction  (HCC) 09/10/2022   Piriformis syndrome of both sides 05/31/2019   History of bipolar disorder 04/18/2019   Tardive dyskinesia 04/18/2019   Urticaria due to drug allergy 04/18/2019   Lumbar spondylosis 02/05/2019   Bipolar affective disorder, current episode mixed (HCC) 01/02/2019   Arthritis 01/02/2019   Cigarette nicotine dependence without complication 01/02/2019   Bilateral temporomandibular joint pain 10/02/2018   Referred otalgia, bilateral 10/02/2018   Paresthesias 03/09/2018   Chronic migraine without aura, intractable, with status migrainosus 04/22/2016   Bipolar 1 disorder, mixed, moderate (HCC) 07/15/2015    Class: Chronic   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 06/01/2015   Sciatica 01/09/2015   Reactive hypoglycemia 12/17/2013   Musculoskeletal malfunction arising from mental factors 04/04/2013   Incisional hernia 11/15/2012   Other postprocedural status(V45.89) 11/15/2012   S/P hernia surgery 11/15/2012   Abdominal pain 11/11/2012   Chronic pain syndrome 11/11/2012   Disorder of sacrum 11/11/2012   Chronic pain 12/21/2011   Heartburn 10/04/2011   DYSPHAGIA 10/04/2011   Depression with anxiety 09/15/2010   CONSTIPATION 09/15/2010   LUNG NODULE 06/17/2010   Herpes simplex virus (HSV) infection 06/11/2010   FATIGUE 06/11/2010   STRICTURE AND STENOSIS OF ESOPHAGUS 05/13/2010   ABDOMINAL WALL HERNIA 02/27/2010   ENDOMETRIOSIS 12/16/2009   GERD 10/23/2009   PALPITATIONS 10/23/2009   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   DYSTHYMIC DISORDER 06/20/2009   CONDYLOMA ACUMINATUM 04/23/2009   LENTIGO 04/23/2009   DENTAL PAIN 03/19/2009   CERVICAL RADICULOPATHY 12/11/2008   Cervical radiculopathy 12/11/2008   PANIC DISORDER WITHOUT AGORAPHOBIA 08/07/2008   TOBACCO ABUSE 08/07/2008   BACK PAIN, THORACIC REGION 07/07/2007   ALLERGIC RHINITIS 06/02/2007   Past Medical History:  Diagnosis Date   ABDOMINAL WALL HERNIA 02/27/2010   Abnormal Pap smear     ALLERGIC RHINITIS 06/02/2007   ANOREXIA, CHRONIC 08/07/2008   ANXIETY 09/15/2010   Arthritis    BACK PAIN, THORACIC REGION 07/07/2007   Bipolar disorder (HCC)    CERVICAL RADICULOPATHY 12/11/2008   Complication of anesthesia    Pateint reprots a high tolerance   Complication of anesthesia    pt states she has not had her voice completely back since surgery in Nov. 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   Eating disorder    ENDOMETRIOSIS 12/16/2009   FATIGUE 06/11/2010   Fibromyalgia    GERD 10/23/2009   Heart palpitations    HERPES SIMPLEX INFECTION 06/11/2010   LENTIGO 04/23/2009   LUNG NODULE 06/17/2010   Narcotic abuse, continuous (HCC)    pt denies   Ovarian cyst    Palpitations 10/23/2009   Panic disorder    Rheumatoid arthritis(714.0)    Stricture and stenosis of esophagus 05/13/2010   TOBACCO ABUSE 08/07/2008   Urinary tract infection    UTI (urinary tract infection)     Family History  Problem Relation Age of Onset   Depression Mother    Arthritis Mother    Hyperlipidemia Father    Hypertension Father     Past Surgical History:  Procedure Laterality Date   ABDOMINAL SURGERY     ABDOMINAL WALL MESH  REMOVAL     ABLATION ON ENDOMETRIOSIS     CESAREAN SECTION     x2    CYSTOSCOPY N/A 11/22/2023   Procedure: CYSTOSCOPY;  Surgeon: Elisabeth Valli BIRCH, MD;  Location: [facility];  Service: Urology;  Laterality: N/A;   DILATION AND CURETTAGE OF UTERUS     x1    ENDOMETRIAL ABLATION     esophageal dilatation x 4     GUM SURGERY     HERNIA REPAIR     X3   PARTIAL HYSTERECTOMY     POSTERIOR LUMBAR FUSION 2 WITH HARDWARE REMOVAL Left 03/29/2024   Procedure: ARTHROSCOPY, SHOULDER WITH DEBRIDEMENT;  Surgeon: Addie Cordella Hamilton, MD;  Location[facility] OR;  Service: Orthopedics;  Laterality: Left;  LEFT SHOULDER ARTHROSCOPY, DEBRIDEMENT, DISTAL CLAVICLE EXCISION   RESECTION DISTAL  CLAVICAL Left 03/29/2024   Procedure: EXCISION, CLAVICLE, DISTAL, OPEN;  Surgeon: Addie Cordella  Glendia, MD;  Location: [facility] OR;  Service: Orthopedics;  Laterality: Left;   SHOULDER SURGERY Right 09/2023   Social History   Occupational History   Not on file  Tobacco Use   Smoking status: Every Day    Current packs/day: 0.20    Types: Cigarettes   Smokeless tobacco: Never   Tobacco comments:    2-3 cigarettes per day as of May 2025  Vaping Use   Vaping status: Every Day  Substance and Sexual Activity   Alcohol use: No   Drug use: No   Sexual activity: Yes    Birth control/protection: Surgical

## 2024-06-13 ENCOUNTER — Ambulatory Visit
Admission: RE | Admit: 2024-06-13 | Discharge: 2024-06-13 | Disposition: A | Discharge status: Home / Self Care | Source: Ambulatory Visit | Attending: Physician Assistant | Admitting: Physician Assistant

## 2024-06-13 DIAGNOSIS — M79672 Pain in left foot: Secondary | ICD-10-CM

## 2024-06-14 ENCOUNTER — Ambulatory Visit (HOSPITAL_COMMUNITY)
Admission: RE | Admit: 2024-06-14 | Discharge: 2024-06-14 | Disposition: A | Discharge status: Home / Self Care | Source: Ambulatory Visit | Attending: Vascular Surgery | Admitting: Vascular Surgery

## 2024-06-14 ENCOUNTER — Other Ambulatory Visit: Payer: Self-pay

## 2024-06-14 ENCOUNTER — Telehealth: Payer: Self-pay

## 2024-06-14 DIAGNOSIS — M79672 Pain in left foot: Secondary | ICD-10-CM | POA: Insufficient documentation

## 2024-06-14 NOTE — Telephone Encounter (Signed)
 Patient called and stated she was negative for a blood clot, she is upset because she wanted a ultrasound of her foot. I told her we aren't able to do that today. She got upset saying yes you do those there because luke did a u/s exam on my other foot. I re explained in a different way that we dont have any opening with luke or Dr burnetta to u/s her foot for her. She then said well I need pain medication then. She stated the left over Hydrocodone  5mg  she had are not helping her pain. She would like medication sent in today because she can't stand this pain anymore.

## 2024-06-14 NOTE — Telephone Encounter (Signed)
 Pt called complaining of lower left leg swelling and pain. She has just had an MRI earlier this week. I put in an order for her to go to vascular to get an US  and rule out a blood clot.

## 2024-06-14 NOTE — Telephone Encounter (Signed)
Negative for DVT in left leg.

## 2024-06-15 ENCOUNTER — Telehealth: Payer: Self-pay | Admitting: Surgical

## 2024-06-15 ENCOUNTER — Other Ambulatory Visit

## 2024-06-15 NOTE — Telephone Encounter (Signed)
 Duplicate. Waiting to be advised by [facility].

## 2024-06-15 NOTE — Telephone Encounter (Signed)
 Patient called and ask if she can get an appointment asap because she thinks she needs a foot ultra sound and she can't walk on it. She also ask if she can get something for pain. CB#[phone]

## 2024-06-18 ENCOUNTER — Ambulatory Visit: Admitting: Surgical

## 2024-06-18 ENCOUNTER — Encounter: Payer: Self-pay | Admitting: Surgical

## 2024-06-18 DIAGNOSIS — M76822 Posterior tibial tendinitis, left leg: Secondary | ICD-10-CM | POA: Diagnosis not present

## 2024-06-18 NOTE — Progress Notes (Signed)
 Follow-up Office Visit Note   Patient: Gina [name]           Date of Birth: Aug 01, 1976           MRN: [id number] Visit Date: 06/18/2024 Requested by: [name], NP [address] [place],  [place] [address] PCP: [name], NP  Subjective: Chief Complaint  Patient presents with   Left Foot - Pain    HPI: Gina [name] is a 48 y.o. female who returns to the office for follow-up visit.    Plan at last visit (with Mary-Anne persons PA-C) was: I explained to the patient I do not have access and ability for an ultrasound. I do not see any red flags she is very hesitant to move her foot and is tender even to light touch. Very difficult for her to plantarflex dorsiflex evert or invert secondary to pain. Have recommended that she put the boot on the other foot as she would like to do that. I have also placed her on a Medrol  Dosepak. Could follow-up with Dr. Addie or Herlene in a couple weeks   Since then, patient notes she has had MRI scan of the left foot.  No improvement in her symptoms since last visit with Mary-Anne.  She states that this began about 9 days ago after she stepped out of the car and felt a sudden painful sensation in the left midfoot.  Since then she has had very diffuse pain throughout the entirety of the foot along with associated numbness and tingling both on the dorsal and plantar aspects of the foot.  She has a little bit of radiation of symptoms into the medial shin but no radicular pain throughout the entire leg.  No worsening of her typical back pain.  She has no cellulitis or skin changes.  She tried Medrol  Dosepak which did help with the 2 days that she took that but made her very sick and she had to throw up.  She discontinued this steroid pack after that.  She is ambulatory though she is having a lot of difficulty putting weight on her medial foot.              ROS: All systems reviewed are negative as they relate to the chief  complaint within the history of present illness.  Patient denies fevers or chills.  Assessment & Plan: Visit Diagnoses:  1. Posterior tibial tendon dysfunction (PTTD) of left lower extremity     Plan: Gina [name] is a 48 y.o. female who returns to the office for follow-up visit.  Plan from last visit was noted above in HPI.  They now return with left foot pain that has not improved over the last 9 days since symptom onset.  She has had MRI of the left foot that did not show any significant pathology.  Her exam today seems to localize majority of her pain to the medial aspect of the midfoot in the region of the posterior tib tendon.  She had ultrasound exam in this area that demonstrated really no fluid in the tendon sheath but with probe palpation over the posterior tib tendon as well as the accessory navicular os, patient became very nauseous and reports this reproduced a lot of her pain.  She actually vomited because of the amount of pain she was in.  We discussed options available to patient such as diagnostic lidocaine  injection in the posterior tib tendon sheath versus treating this as posterior tib  tendon deficiency.  There is no evidence of tearing based on her MRI scan results.  Plan to try posterior tib tendon brace and see her back in 3 weeks for clinical recheck.  Patient and family agreed with plan.  Follow-Up Instructions: No follow-ups on file.   Orders:  No orders of the defined types were placed in this encounter.  No orders of the defined types were placed in this encounter.     Procedures: No procedures performed   Clinical Data: No additional findings.  Objective: Vital Signs: LMP 01/01/2004   Physical Exam:  Constitutional: Patient appears well-developed HEENT:  Head: Normocephalic Eyes:EOM are normal Neck: Normal range of motion Cardiovascular: Normal rate Pulmonary/chest: Effort normal Neurologic: Patient is alert Skin: Skin is warm Psychiatric:  Patient has normal mood and affect  Ortho Exam: Ortho exam demonstrates intact ankle dorsiflexion, plantarflexion, inversion, eversion.  She has mild tenderness over the ankle joint line and quite severe tenderness over the course of the posterior tib tendon near the navicular and just proximal to the navicular.  Mild to moderate pain around the Lisfranc complex with Tinel sign positive in this region reproducing tingling through the first webspace.  No pain with subtalar range of motion.  No pain with ankle range of motion passively.  No calf tenderness.  Negative Homans' sign.  No tenderness over the fifth metatarsal base.  Specialty Comments:  Narrative & Impression CLINICAL DATA:  Low back pain, prior surgery, new symptoms. Ongoing low back pain for 2 months. No known injury. No prior surgery to area.   EXAM: MRI LUMBAR SPINE WITHOUT AND WITH CONTRAST   TECHNIQUE: Multiplanar and multiecho pulse sequences of the lumbar spine were obtained without and with intravenous contrast.   CONTRAST:  10mL GADAVIST  GADOBUTROL  1 MMOL/ML IV SOLN   COMPARISON:  Lumbar MRI 04/02/2018. CT of the abdomen and pelvis 06/15/2023. Lumbar spine radiographs 09/18/2018.   FINDINGS: Segmentation: Conventional anatomy assumed, with the last open disc space designated L5-S1.Concordant with prior imaging.   Alignment: Mild convex right scoliosis. No focal angulation or listhesis.   Vertebrae: No worrisome osseous lesion, acute fracture or pars defect. Chronic Schmorl's node in the inferior endplate of L1. No evidence of discitis or osteomyelitis. No obvious postsurgical changes in the lumbar spine.   Conus medullaris: Extends to the L1-2 level and appears normal. No abnormal intradural enhancement.   Paraspinal and other soft tissues: No significant paraspinal findings.   Disc levels:   Sagittal images demonstrate no significant disc space findings within the visualized lower thoracic spine.    L1-2: Stable loss of disc height with disc bulging and endplate osteophytes. No spinal stenosis or nerve root encroachment.   L2-3: Normal interspace.   L3-4: Normal interspace.   L4-5: Normal interspace.   L5-S1: Normal interspace.   IMPRESSION: 1. No acute findings or explanation for the patient's symptoms. 2. Stable spondylosis at L1-2 without resulting spinal stenosis or nerve root encroachment.     Electronically Signed   By: Elsie Perone M.D.   On: 07/29/2023 18:36  Imaging: No results found.   PMFS History: Patient Active Problem List   Diagnosis Date Noted   Arthritis of left acromioclavicular joint 04/14/2024   Chronic pain in left shoulder 11/01/2023   Calcific supraspinatus tendonitis 10/03/2023   Bursitis of right shoulder 10/03/2023   Arthritis of right acromioclavicular joint 10/03/2023   S/P arthroscopy of right shoulder 09/27/2023   Persistent headaches 01/18/2023   Blurry vision 01/18/2023   Weight  gain 01/18/2023   SBO (small bowel obstruction) (HCC) 09/13/2022   Nausea and vomiting 09/11/2022   Partial small bowel obstruction (HCC) 09/10/2022   Piriformis syndrome of both sides 05/31/2019   History of bipolar disorder 04/18/2019   Tardive dyskinesia 04/18/2019   Urticaria due to drug allergy 04/18/2019   Lumbar spondylosis 02/05/2019   Bipolar affective disorder, current episode mixed (HCC) 01/02/2019   Arthritis 01/02/2019   Cigarette nicotine dependence without complication 01/02/2019   Bilateral temporomandibular joint pain 10/02/2018   Referred otalgia, bilateral 10/02/2018   Paresthesias 03/09/2018   Chronic migraine without aura, intractable, with status migrainosus 04/22/2016   Bipolar 1 disorder, mixed, moderate (HCC) 07/15/2015    Class: Chronic   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   Sciatica 01/09/2015   Reactive hypoglycemia 12/17/2013   Musculoskeletal malfunction arising from mental factors 04/04/2013    Incisional hernia 11/15/2012   Other postprocedural status(V45.89) 11/15/2012   S/P hernia surgery 11/15/2012   Abdominal pain 11/11/2012   Chronic pain syndrome 11/11/2012   Disorder of sacrum 11/11/2012   Chronic pain 12/21/2011   Heartburn 10/04/2011   DYSPHAGIA 10/04/2011   Depression with anxiety 09/15/2010   CONSTIPATION 09/15/2010   LUNG NODULE 06/17/2010   Herpes simplex virus (HSV) infection 06/11/2010   FATIGUE 06/11/2010   STRICTURE AND STENOSIS OF ESOPHAGUS 05/13/2010   ABDOMINAL WALL HERNIA 02/27/2010   ENDOMETRIOSIS 12/16/2009   GERD 10/23/2009   PALPITATIONS 10/23/2009   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   DYSTHYMIC DISORDER 06/20/2009   CONDYLOMA ACUMINATUM 04/23/2009   LENTIGO 04/23/2009   DENTAL PAIN 03/19/2009   CERVICAL RADICULOPATHY 12/11/2008   Cervical radiculopathy 12/11/2008   PANIC DISORDER WITHOUT AGORAPHOBIA 08/07/2008   TOBACCO ABUSE 08/07/2008   BACK PAIN, THORACIC REGION 07/07/2007   ALLERGIC RHINITIS 06/02/2007   Past Medical History:  Diagnosis Date   ABDOMINAL WALL HERNIA 02/27/2010   Abnormal Pap smear    ALLERGIC RHINITIS 06/02/2007   ANOREXIA, CHRONIC 08/07/2008   ANXIETY 09/15/2010   Arthritis    BACK PAIN, THORACIC REGION 07/07/2007   Bipolar disorder (HCC)    CERVICAL RADICULOPATHY 12/11/2008   Complication of anesthesia    Pateint reprots a high tolerance   Complication of anesthesia    pt states she has not had her voice completely back since surgery in Nov. 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   Eating disorder    ENDOMETRIOSIS 12/16/2009   FATIGUE 06/11/2010   Fibromyalgia    GERD 10/23/2009   Heart palpitations    HERPES SIMPLEX INFECTION 06/11/2010   LENTIGO 04/23/2009   LUNG NODULE 06/17/2010   Narcotic abuse, continuous (HCC)    pt denies   Ovarian cyst    Palpitations 10/23/2009   Panic disorder     Rheumatoid arthritis(714.0)    Stricture and stenosis of esophagus 05/13/2010   TOBACCO ABUSE 08/07/2008   Urinary tract infection    UTI (urinary tract infection)     Family History  Problem Relation Age of Onset   Depression Mother    Arthritis Mother    Hyperlipidemia Father    Hypertension Father     Past Surgical History:  Procedure Laterality Date   ABDOMINAL SURGERY     ABDOMINAL WALL MESH  REMOVAL     ABLATION ON ENDOMETRIOSIS     CESAREAN SECTION     x2    CYSTOSCOPY N/A 11/22/2023   Procedure: CYSTOSCOPY;  Surgeon: Elisabeth Valli BIRCH, MD;  Location: [facility];  Service: Urology;  Laterality: N/A;   DILATION AND CURETTAGE OF UTERUS     x1    ENDOMETRIAL ABLATION     esophageal dilatation x 4     GUM SURGERY     HERNIA REPAIR     X3   PARTIAL HYSTERECTOMY     POSTERIOR LUMBAR FUSION 2 WITH HARDWARE REMOVAL Left 03/29/2024   Procedure: ARTHROSCOPY, SHOULDER WITH DEBRIDEMENT;  Surgeon: Addie Cordella Hamilton, MD;  Location: [facility] OR;  Service: Orthopedics;  Laterality: Left;  LEFT SHOULDER ARTHROSCOPY, DEBRIDEMENT, DISTAL CLAVICLE EXCISION   RESECTION DISTAL CLAVICAL Left 03/29/2024   Procedure: EXCISION, CLAVICLE, DISTAL, OPEN;  Surgeon: Addie Cordella Hamilton, MD;  Location: [facility] OR;  Service: Orthopedics;  Laterality: Left;   SHOULDER SURGERY Right 09/2023   Social History   Occupational History   Not on file  Tobacco Use   Smoking status: Every Day    Current packs/day: 0.20    Types: Cigarettes   Smokeless tobacco: Never   Tobacco comments:    2-3 cigarettes per day as of May 2025  Vaping Use   Vaping status: Every Day  Substance and Sexual Activity   Alcohol use: No   Drug use: No   Sexual activity: Yes    Birth control/protection: Surgical

## 2024-06-21 ENCOUNTER — Telehealth: Payer: Self-pay | Admitting: Family Medicine

## 2024-06-21 NOTE — Telephone Encounter (Signed)
 Pt is requesting to transfer care from [facility] to [place]. Please advise

## 2024-06-25 ENCOUNTER — Telehealth: Payer: Self-pay | Admitting: Surgical

## 2024-06-25 ENCOUNTER — Other Ambulatory Visit: Payer: Self-pay | Admitting: Surgical

## 2024-06-25 MED ORDER — HYDROCODONE-ACETAMINOPHEN 5-325 MG PO TABS: 1.0000 | ORAL_TABLET | Freq: Two times a day (BID) | ORAL | 0 refills | Status: DC | PRN

## 2024-06-25 NOTE — Telephone Encounter (Signed)
 Patient called and said that her L foot isn't getting any better and if you can give her something for pain. She said she is in a lot  of pain. CB#[phone]

## 2024-06-25 NOTE — Telephone Encounter (Signed)
 I sent in  refill for Norco.  Lets see how this helps her.  Will see her back in 2 weeks and we can better evaluate the foot and potentially try injection if no improvement.

## 2024-07-03 ENCOUNTER — Encounter: Payer: Self-pay | Admitting: Sports Medicine

## 2024-07-03 ENCOUNTER — Ambulatory Visit (HOSPITAL_COMMUNITY)
Admission: EM | Admit: 2024-07-03 | Discharge: 2024-07-04 | Disposition: A | Discharge status: Home / Self Care | Attending: Psychiatry | Admitting: Psychiatry

## 2024-07-03 DIAGNOSIS — Z79899 Other long term (current) drug therapy: Secondary | ICD-10-CM | POA: Insufficient documentation

## 2024-07-03 DIAGNOSIS — F411 Generalized anxiety disorder: Secondary | ICD-10-CM | POA: Diagnosis not present

## 2024-07-03 DIAGNOSIS — Z560 Unemployment, unspecified: Secondary | ICD-10-CM | POA: Diagnosis not present

## 2024-07-03 DIAGNOSIS — F319 Bipolar disorder, unspecified: Secondary | ICD-10-CM | POA: Diagnosis not present

## 2024-07-03 DIAGNOSIS — F17290 Nicotine dependence, other tobacco product, uncomplicated: Secondary | ICD-10-CM | POA: Insufficient documentation

## 2024-07-03 DIAGNOSIS — F3110 Bipolar disorder, current episode manic without psychotic features, unspecified: Secondary | ICD-10-CM | POA: Diagnosis present

## 2024-07-03 DIAGNOSIS — Z91148 Patient's other noncompliance with medication regimen for other reason: Secondary | ICD-10-CM | POA: Diagnosis not present

## 2024-07-03 MED ORDER — OLANZAPINE 5 MG PO TBDP: 5.0000 mg | ORAL_TABLET | Freq: Three times a day (TID) | ORAL | Status: DC | PRN

## 2024-07-03 MED ORDER — MAGNESIUM HYDROXIDE 400 MG/5ML PO SUSP: 30.0000 mL | Freq: Every day | ORAL | Status: DC | PRN

## 2024-07-03 MED ORDER — OLANZAPINE 10 MG IM SOLR: 5.0000 mg | Freq: Three times a day (TID) | INTRAMUSCULAR | Status: DC | PRN

## 2024-07-03 MED ORDER — ALUM & MAG HYDROXIDE-SIMETH 200-200-20 MG/5ML PO SUSP: 30.0000 mL | ORAL | Status: DC | PRN

## 2024-07-03 MED ORDER — ACETAMINOPHEN 325 MG PO TABS
650.0000 mg | ORAL_TABLET | Freq: Four times a day (QID) | ORAL | Status: DC | PRN
  Administered 2024-07-04: 650 mg via ORAL
  Filled 2024-07-03: qty 2

## 2024-07-03 MED ORDER — OLANZAPINE 10 MG IM SOLR: 10.0000 mg | Freq: Three times a day (TID) | INTRAMUSCULAR | Status: DC | PRN

## 2024-07-03 NOTE — Progress Notes (Signed)
   07/03/24 2125  [facility] Triage Screening (Walk-ins at [facility] only)  How Did You Hear About Us ? Family/Friend  What Is the Reason for Your Visit/Call Today? Pt presents to [facility] as a voluntary walk-in, accompanied by her husband with complaint of worsening depression and passive SI, without plan or intent. Pt reports diagnosis  of Bipolar. Pt is prescribed Latuda  and Lexapro , but did not like the way it made her feel at times. Pt was recently prescribed Welbutrin for depression. Pt is followed by psychiatrist (Dr. Jess) at [facility]. Pt is also established with oupatient therapy at the same location. Pt denies wanting to hurt or harm herself at this time, but is indifferent about whether she lives or dies. Pt states  I could get hit by a truck and I wouldn't care. Pt currently denies HI,AVH and substance/alcohol use.  How Long Has This Been Causing You Problems? <Week  Have You Recently Had Any Thoughts About Hurting Yourself? Yes  How long ago did you have thoughts about hurting yourself? passive SI, denies plan or intent  Are You Planning to Commit Suicide/Harm Yourself At This time? No  Have you Recently Had Thoughts About Hurting Someone Sherral? No  Are You Planning To Harm Someone At This Time? No  Physical Abuse Denies  Verbal Abuse Denies  Sexual Abuse Denies  Exploitation of patient/patient's resources Denies  Self-Neglect Denies  Possible abuse reported to:  (NA)  Are you currently experiencing any auditory, visual or other hallucinations? No  Have You Used Any Alcohol or Drugs in the Past 24 Hours? No  Do you have any current medical co-morbidities that require immediate attention? No  Clinician description of patient physical appearance/behavior: Cooperative, tearful, casually dressed  What Do You Feel Would Help You the Most Today? Treatment for Depression or other mood problem  If access to [facility] was not available, would you have sought care in the  Emergency Department? Yes  Determination of Need Routine (7 days)  Options For Referral Other: Comment;Outpatient Therapy;Medication Management
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
## 2024-07-03 NOTE — ED Provider Notes (Signed)
 [facility] Continuous Assessment Admission H&P  Date: 07/03/24 Patient Name: Gina [name] MRN: [id number] Chief Complaint: medication change  Diagnoses:  Final diagnoses:  Bipolar affective disorder, remission status unspecified (HCC)  H/O medication noncompliance  Medication course changed    HPI: Gina [name], 48 y/o female with a history of bipolar disorder, MDD, GAD.  Presented to [facility], accompanied by her husband.  Per the patient she has bipolar disorder and she has not been taking her medicine on a regular basis.  According to her she was prescribed Latuda  and Wellbutrin  by her psychiatrist however the Wellbutrin  is extended release and she does not like to swallow medicines so she stopped taking it altogether.  However today the provider called and a immediate release Wellbutrin  for her but he told her to come here so she can be checked again until her medicine gets in her system.  Patient currently lives with her husband currently unemployed.  A review of patient PDMP show that patient is on a benzodiazepine and opioid medication.  Writer discussed with patient that we will not be able to give further benzos while she is here will give her a substitute.  Patient keep asking a lot of questions if she is going to get this medicine or that medicine it does appear that patient is medication seeking. Patient also had multiple questions wanting to know if she was going get a private room, if she can have her cell phone and if she can wear her close.  Writer explained to patient that she is not allowed to have cell phones on the unit.  And also everyone changed over into scrubs.  According to patient she has been hospitalized in the past when she was a teenager for suicidal ideation and other bipolar disorder.  Face-to-face observation, patient is alert and oriented x 4, speech is clear, maintain eye contact.  Patient when asked if she is suicidal stated no but said that she does  have thoughts of not wanting to be here but she does not have a plan.  Denies HI, AVH or paranoia.  Denies alcohol use.  Reports she Vapes, access to guns at this time.  It does not appear that patient is influenced by internal stimuli.  Does not seem to be an immediate risk to herself or others.  Patient however seem to be medication seeking as patient ask a lot of questions about wanting to get Ativan  stating she gets it 3 times a day patient also went on to say she wants something to sleep and she get Ambien once.  Writer discussed with patient that we can give her trazodone  for sleep.  We can give her some hydroxyzine  for any anxiety she is having.   Patient and her husband were given time to discuss to see if they wanted to be admitted.  After 10 to 15 minutes patient stated that she will stay but we need to keep in mind that she is voluntary so she can leave whenever she wants.  Recommend observation  Total Time spent with patient: 20 minutes  Musculoskeletal  Strength & Muscle Tone: within normal limits Gait & Station: normal Patient leans: N/A  Psychiatric Specialty Exam  Presentation General Appearance:  Appropriate for Environment  Eye Contact: Good  Speech: Clear and Coherent  Speech Volume: Normal  Handedness: Right   Mood and Affect  Mood: Anxious  Affect: Congruent   Thought Process  Thought Processes: Coherent  Descriptions of Associations:Intact  Orientation:Full (Time,  Place and Person)  Thought Content:WDL    Hallucinations:Hallucinations: None  Ideas of Reference:None  Suicidal Thoughts:Suicidal Thoughts: Yes, Passive SI Passive Intent and/or Plan: Without Plan; Without Intent  Homicidal Thoughts:Homicidal Thoughts: No   Sensorium  Memory: Immediate Fair  Judgment: Fair  Insight: Fair   Art therapist  Concentration: Fair  Attention Span: Fair  Recall: Fair  Fund of  Knowledge: Fair  Language: Fair   Psychomotor Activity  Psychomotor Activity: Psychomotor Activity: Normal   Assets  Assets: Desire for Improvement   Sleep  Sleep: Sleep: Fair Number of Hours of Sleep: 6   Nutritional Assessment (For OBS and FBC admissions only) Has the patient had a weight loss or gain of 10 pounds or more in the last 3 months?: No Has the patient had a decrease in food intake/or appetite?: No Does the patient have dental problems?: No Does the patient have eating habits or behaviors that may be indicators of an eating disorder including binging or inducing vomiting?: No Has the patient recently lost weight without trying?: 0 Has the patient been eating poorly because of a decreased appetite?: 0 Malnutrition Screening Tool Score: 0    Physical Exam HENT:     Head: Normocephalic.     Nose: Nose normal.  Eyes:     Pupils: Pupils are equal, round, and reactive to light.  Cardiovascular:     Rate and Rhythm: Normal rate.  Pulmonary:     Effort: Pulmonary effort is normal.  Musculoskeletal:        General: Normal range of motion.     Cervical back: Normal range of motion.  Neurological:     General: No focal deficit present.     Mental Status: She is alert.  Psychiatric:        Mood and Affect: Mood normal.        Thought Content: Thought content normal.    Review of Systems  Constitutional: Negative.   HENT: Negative.    Eyes: Negative.   Respiratory: Negative.    Cardiovascular: Negative.   Gastrointestinal: Negative.   Genitourinary: Negative.   Musculoskeletal: Negative.   Skin: Negative.   Neurological: Negative.   Psychiatric/Behavioral:  Positive for depression. The patient is nervous/anxious.     Blood pressure 109/75, pulse 99, temperature 98.5 F (36.9 C), temperature source Oral, resp. rate 20, last menstrual period 01/01/2004, SpO2 97%. There is no height or weight on file to calculate BMI.  Past Psychiatric History:  Bipolar disorder, GAD, MDD, Si  Is the patient at risk to self? No  Has the patient been a risk to self in the past 6 months? No .    Has the patient been a risk to self within the distant past? Yes   Is the patient a risk to others? No   Has the patient been a risk to others in the past 6 months? No   Has the patient been a risk to others within the distant past? No   Past Medical History: see chart   Family History: unknown   Social History: vape  Last Labs:  Appointment on 05/23/2024  Component Date Value Ref Range Status   Vitamin B-12 05/23/2024 574  211 - 911 pg/mL Final   Vit D, 25-Hydroxy 05/23/2024 36  30 - 100 ng/mL Final   Comment: Vitamin D  Status         25-OH Vitamin D : . Deficiency:                    <  20 ng/mL Insufficiency:             20 - 29 ng/mL Optimal:                 > or = 30 ng/mL . For 25-OH Vitamin D  testing on patients on  D2-supplementation and patients for whom quantitation  of D2 and D3 fractions is required, the Quest[employer]ureD(TM) 25-OH VIT D, (D2,D3), LC/MS/MS is recommended: order  code 07111 (patients >69yrs). . See Note 1 . Note 1 . For additional information, please refer to  http://education.[facility].com/faq/FAQ199  (This link is being provided for informational/ educational purposes only.)   Admission on 05/21/2024, Discharged on 05/22/2024  Component Date Value Ref Range Status   Lipase 05/21/2024 28  11 - 51 U/L Final   Performed at [facility], [address] [place], [place] [address]   Sodium 05/21/2024 135  135 - 145 mmol/L Final   Potassium 05/21/2024 3.7  3.5 - 5.1 mmol/L Final   Chloride 05/21/2024 102  98 - 111 mmol/L Final   CO2 05/21/2024 23  22 - 32 mmol/L Final   Glucose, Bld 05/21/2024 96  70 - 99 mg/dL Final   Glucose reference range applies only to samples taken after fasting for at least 8 hours.   BUN 05/21/2024 10  6 - 20 mg/dL Final   Creatinine, Ser 05/21/2024 0.69  0.44 - 1.00  mg/dL Final   Calcium 92/78/7974 9.5  8.9 - 10.3 mg/dL Final   Total Protein 92/78/7974 7.2  6.5 - 8.1 g/dL Final   Albumin 92/78/7974 4.1  3.5 - 5.0 g/dL Final   AST 92/78/7974 18  15 - 41 U/L Final   ALT 05/21/2024 17  0 - 44 U/L Final   Alkaline Phosphatase 05/21/2024 111  38 - 126 U/L Final   Total Bilirubin 05/21/2024 0.7  0.0 - 1.2 mg/dL Final   GFR, Estimated 05/21/2024 >60  >60 mL/min Final   Comment: (NOTE) Calculated using the CKD-EPI Creatinine Equation (2021)    Anion gap 05/21/2024 10  5 - 15 Final   Performed at [facility], [address] [place], [place] [address]   WBC 05/21/2024 9.7  4.0 - 10.5 K/uL Final   RBC 05/21/2024 4.81  3.87 - 5.11 MIL/uL Final   Hemoglobin 05/21/2024 14.9  12.0 - 15.0 g/dL Final   HCT 92/78/7974 45.4  36.0 - 46.0 % Final   MCV 05/21/2024 94.4  80.0 - 100.0 fL Final   MCH 05/21/2024 31.0  26.0 - 34.0 pg Final   MCHC 05/21/2024 32.8  30.0 - 36.0 g/dL Final   RDW 92/78/7974 11.9  11.5 - 15.5 % Final   Platelets 05/21/2024 271  150 - 400 K/uL Final   nRBC 05/21/2024 0.0  0.0 - 0.2 % Final   Performed at [facility], [address] [place], [place] [address]   Color, Urine 05/22/2024 YELLOW  YELLOW Final   APPearance 05/22/2024 CLEAR  CLEAR Final   Specific Gravity, Urine 05/22/2024 1.028  1.005 - 1.030 Final   pH 05/22/2024 6.0  5.0 - 8.0 Final   Glucose, UA 05/22/2024 NEGATIVE  NEGATIVE mg/dL Final   Hgb urine dipstick 05/22/2024 NEGATIVE  NEGATIVE Final   Bilirubin Urine 05/22/2024 NEGATIVE  NEGATIVE Final   Ketones, ur 05/22/2024 NEGATIVE  NEGATIVE mg/dL Final   Protein, ur 92/77/7974 NEGATIVE  NEGATIVE mg/dL Final   Nitrite 92/77/7974 NEGATIVE  NEGATIVE Final   Leukocytes,Ua 05/22/2024 NEGATIVE  NEGATIVE Final   Performed at [facility], [address], [place], [place] [address]  Admission on 05/16/2024, Discharged on 05/16/2024  Component Date Value Ref Range Status    Lipase 05/16/2024 20  11 - 51 U/L Final   Performed at [facility], [address], [place], [place] [address]   Sodium 05/16/2024 136  135 - 145 mmol/L Final   Potassium 05/16/2024 4.5  3.5 - 5.1 mmol/L Final   Comment: Slight hemolysis  Hemolysis at this level may affect result     Chloride 05/16/2024 102  98 - 111 mmol/L Final   CO2 05/16/2024 19 (L)  22 - 32 mmol/L Final   Glucose, Bld 05/16/2024 106 (H)  70 - 99 mg/dL Final   Glucose reference range applies only to samples taken after fasting for at least 8 hours.   BUN 05/16/2024 9  6 - 20 mg/dL Final   Creatinine, Ser 05/16/2024 0.79  0.44 - 1.00 mg/dL Final   Calcium 92/83/7974 9.4  8.9 - 10.3 mg/dL Final   Total Protein 92/83/7974 7.1  6.5 - 8.1 g/dL Final   Albumin 92/83/7974 4.4  3.5 - 5.0 g/dL Final   AST 92/83/7974 25  15 - 41 U/L Final   HEMOLYSIS AT THIS LEVEL MAY AFFECT RESULT   ALT 05/16/2024 15  0 - 44 U/L Final   Alkaline Phosphatase 05/16/2024 129 (H)  38 - 126 U/L Final   Total Bilirubin 05/16/2024 0.4  0.0 - 1.2 mg/dL Final   GFR, Estimated 05/16/2024 >60  >60 mL/min Final   Comment: (NOTE) Calculated using the CKD-EPI Creatinine Equation (2021)    Anion gap 05/16/2024 14  5 - 15 Final   Performed at [facility], [address], [place], [place] [address]   WBC 05/16/2024 8.4  4.0 - 10.5 K/uL Final   RBC 05/16/2024 4.70  3.87 - 5.11 MIL/uL Final   Hemoglobin 05/16/2024 15.1 (H)  12.0 - 15.0 g/dL Final   HCT 92/83/7974 43.6  36.0 - 46.0 % Final   MCV 05/16/2024 92.8  80.0 - 100.0 fL Final   MCH 05/16/2024 32.1  26.0 - 34.0 pg Final   MCHC 05/16/2024 34.6  30.0 - 36.0 g/dL Final   RDW 92/83/7974 12.1  11.5 - 15.5 % Final   Platelets 05/16/2024 278  150 - 400 K/uL Final   nRBC 05/16/2024 0.0  0.0 - 0.2 % Final   Performed at [facility], [address], [place], [place] [address]  Admission on 05/02/2024, Discharged on 05/02/2024  Component Date  Value Ref Range Status   WBC 05/02/2024 8.7  4.0 - 10.5 K/uL Final   RBC 05/02/2024 4.58  3.87 - 5.11 MIL/uL Final   Hemoglobin 05/02/2024 14.7  12.0 - 15.0 g/dL Final   HCT 92/97/7974 42.5  36.0 - 46.0 % Final   MCV 05/02/2024 92.8  80.0 - 100.0 fL Final   MCH 05/02/2024 32.1  26.0 - 34.0 pg Final   MCHC 05/02/2024 34.6  30.0 - 36.0 g/dL Final   RDW 92/97/7974 12.5  11.5 - 15.5 % Final   Platelets 05/02/2024 266  150 - 400 K/uL Final   nRBC 05/02/2024 0.0  0.0 - 0.2 % Final   Performed at [facility], [address] [place], [place] [address]   Color, Urine 05/02/2024 YELLOW (A)  YELLOW Final   APPearance 05/02/2024 CLEAR  CLEAR Final   Specific Gravity, Urine 05/02/2024 1.018  1.005 - 1.030  Final   pH 05/02/2024 6.0  5.0 - 8.0 Final   Glucose, UA 05/02/2024 NEGATIVE  NEGATIVE mg/dL Final   Hgb urine dipstick 05/02/2024 NEGATIVE  NEGATIVE Final   Bilirubin Urine 05/02/2024 NEGATIVE  NEGATIVE Final   Ketones, ur 05/02/2024 NEGATIVE  NEGATIVE mg/dL Final   Protein, ur 92/97/7974 NEGATIVE  NEGATIVE mg/dL Final   Nitrite 92/97/7974 NEGATIVE  NEGATIVE Final   Leukocytes,Ua 05/02/2024 NEGATIVE  NEGATIVE Final   Performed at [facility], [address], [place], [place] [address]   Sodium 05/02/2024 137  135 - 145 mmol/L Final   Potassium 05/02/2024 3.8  3.5 - 5.1 mmol/L Final   Chloride 05/02/2024 105  98 - 111 mmol/L Final   CO2 05/02/2024 21 (L)  22 - 32 mmol/L Final   Glucose, Bld 05/02/2024 94  70 - 99 mg/dL Final   Glucose reference range applies only to samples taken after fasting for at least 8 hours.   BUN 05/02/2024 10  6 - 20 mg/dL Final   Creatinine, Ser 05/02/2024 0.73  0.44 - 1.00 mg/dL Final   Calcium 92/97/7974 9.2  8.9 - 10.3 mg/dL Final   Total Protein 92/97/7974 6.6  6.5 - 8.1 g/dL Final   Albumin 92/97/7974 4.0  3.5 - 5.0 g/dL Final   AST 92/97/7974 16  15 - 41 U/L Final   ALT 05/02/2024 13  0 - 44 U/L Final   Alkaline  Phosphatase 05/02/2024 121  38 - 126 U/L Final   Total Bilirubin 05/02/2024 0.5  0.0 - 1.2 mg/dL Final   GFR, Estimated 05/02/2024 >60  >60 mL/min Final   Comment: (NOTE) Calculated using the CKD-EPI Creatinine Equation (2021)    Anion gap 05/02/2024 11  5 - 15 Final   Performed at [facility], [address] [place], [place] [address]   Lipase 05/02/2024 16  11 - 51 U/L Final   Performed at [facility], [address], [place], [place] [address]  Scanned Document on 05/01/2024  Component Date Value Ref Range Status   HM Pap smear 03/02/2022 nilm   Final   abstracted by him  Hospital Outpatient Visit on 03/27/2024  Component Date Value Ref Range Status   WBC 03/27/2024 8.9  4.0 - 10.5 K/uL Final   RBC 03/27/2024 4.45  3.87 - 5.11 MIL/uL Final   Hemoglobin 03/27/2024 14.6  12.0 - 15.0 g/dL Final   HCT 94/72/7974 42.3  36.0 - 46.0 % Final   MCV 03/27/2024 95.1  80.0 - 100.0 fL Final   MCH 03/27/2024 32.8  26.0 - 34.0 pg Final   MCHC 03/27/2024 34.5  30.0 - 36.0 g/dL Final   RDW 94/72/7974 12.4  11.5 - 15.5 % Final   Platelets 03/27/2024 241  150 - 400 K/uL Final   nRBC 03/27/2024 0.0  0.0 - 0.2 % Final   Performed at [facility], [address], [place], [place] [address]   Sodium 03/27/2024 137  135 - 145 mmol/L Final   Potassium 03/27/2024 4.6  3.5 - 5.1 mmol/L Final   Chloride 03/27/2024 105  98 - 111 mmol/L Final   CO2 03/27/2024 26  22 - 32 mmol/L Final   Glucose, Bld 03/27/2024 90  70 - 99 mg/dL Final   Glucose reference range applies only to samples taken after fasting for at least 8 hours.   BUN 03/27/2024 11  6 - 20 mg/dL Final   Creatinine, Ser 03/27/2024 0.64  0.44 - 1.00 mg/dL  Final   Calcium 03/27/2024 8.6 (L)  8.9 - 10.3 mg/dL Final   GFR, Estimated 03/27/2024 >60  >60 mL/min Final   Comment: (NOTE) Calculated using the CKD-EPI Creatinine Equation (2021)    Anion gap 03/27/2024 6  5 - 15 Final   Performed at [facility], [address] [place], [place] [address]  Admission on 03/25/2024, Discharged on 03/25/2024  Component Date Value Ref Range Status   Glucose-Capillary 03/25/2024 86  70 - 99 mg/dL Final   Glucose reference range applies only to samples taken after fasting for at least 8 hours.   Sodium 03/25/2024 139  135 - 145 mmol/L Final   Potassium 03/25/2024 4.3  3.5 - 5.1 mmol/L Final   Chloride 03/25/2024 103  98 - 111 mmol/L Final   BUN 03/25/2024 8  6 - 20 mg/dL Final   Creatinine, Ser 03/25/2024 0.60  0.44 - 1.00 mg/dL Final   Glucose, Bld 94/74/7974 82  70 - 99 mg/dL Final   Glucose reference range applies only to samples taken after fasting for at least 8 hours.   Calcium, Ion 03/25/2024 1.13 (L)  1.15 - 1.40 mmol/L Final   TCO2 03/25/2024 23  22 - 32 mmol/L Final   Hemoglobin 03/25/2024 15.3 (H)  12.0 - 15.0 g/dL Final   HCT 94/74/7974 45.0  36.0 - 46.0 % Final   WBC 03/25/2024 7.3  4.0 - 10.5 K/uL Final   RBC 03/25/2024 4.71  3.87 - 5.11 MIL/uL Final   Hemoglobin 03/25/2024 15.0  12.0 - 15.0 g/dL Final   HCT 94/74/7974 44.3  36.0 - 46.0 % Final   MCV 03/25/2024 94.1  80.0 - 100.0 fL Final   MCH 03/25/2024 31.8  26.0 - 34.0 pg Final   MCHC 03/25/2024 33.9  30.0 - 36.0 g/dL Final   RDW 94/74/7974 12.2  11.5 - 15.5 % Final   Platelets 03/25/2024 251  150 - 400 K/uL Final   nRBC 03/25/2024 0.0  0.0 - 0.2 % Final   Performed at [facility], [address], [place], [place] [address]   Neutrophils Relative % 03/25/2024 55  % Final   Neutro Abs 03/25/2024 3.9  1.7 - 7.7 K/uL Final   Lymphocytes Relative 03/25/2024 35  % Final   Lymphs Abs 03/25/2024 2.6  0.7 - 4.0 K/uL Final   Monocytes Relative 03/25/2024 8  % Final   Monocytes Absolute 03/25/2024 0.6  0.1 - 1.0 K/uL Final   Eosinophils Relative 03/25/2024 2  % Final   Eosinophils Absolute 03/25/2024 0.1  0.0 - 0.5 K/uL Final   Basophils Relative 03/25/2024 0  % Final   Basophils Absolute 03/25/2024 0.0  0.0 - 0.1  K/uL Final   Immature Granulocytes 03/25/2024 0  % Final   Abs Immature Granulocytes 03/25/2024 0.03  0.00 - 0.07 K/uL Final   Performed at [facility], [address], [place], [place] [address]   Sodium 03/25/2024 138  135 - 145 mmol/L Final   Potassium 03/25/2024 4.3  3.5 - 5.1 mmol/L Final   Chloride 03/25/2024 107  98 - 111 mmol/L Final   CO2 03/25/2024 25  22 - 32 mmol/L Final   Glucose, Bld 03/25/2024 89  70 - 99 mg/dL Final   Glucose reference range applies only to samples taken after fasting for at least 8 hours.   BUN 03/25/2024 7  6 - 20 mg/dL Final   Creatinine, Ser 03/25/2024 0.70  0.44 - 1.00 mg/dL  Final   Calcium 03/25/2024 8.7 (L)  8.9 - 10.3 mg/dL Final   Total Protein 94/74/7974 6.6  6.5 - 8.1 g/dL Final   Albumin 94/74/7974 3.6  3.5 - 5.0 g/dL Final   AST 94/74/7974 17  15 - 41 U/L Final   ALT 03/25/2024 18  0 - 44 U/L Final   Alkaline Phosphatase 03/25/2024 101  38 - 126 U/L Final   Total Bilirubin 03/25/2024 0.5  0.0 - 1.2 mg/dL Final   GFR, Estimated 03/25/2024 >60  >60 mL/min Final   Comment: (NOTE) Calculated using the CKD-EPI Creatinine Equation (2021)    Anion gap 03/25/2024 6  5 - 15 Final   Performed at [facility], [address], [place], [place] [address]  Hospital Outpatient Visit on 01/16/2024  Component Date Value Ref Range Status   WBC 01/16/2024 6.2  4.0 - 10.5 K/uL Final   RBC 01/16/2024 4.90  3.87 - 5.11 MIL/uL Final   Hemoglobin 01/16/2024 15.6 (H)  12.0 - 15.0 g/dL Final   HCT 96/82/7974 46.4 (H)  36.0 - 46.0 % Final   MCV 01/16/2024 94.7  80.0 - 100.0 fL Final   MCH 01/16/2024 31.8  26.0 - 34.0 pg Final   MCHC 01/16/2024 33.6  30.0 - 36.0 g/dL Final   RDW 96/82/7974 12.4  11.5 - 15.5 % Final   Platelets 01/16/2024 226  150 - 400 K/uL Final   nRBC 01/16/2024 0.0  0.0 - 0.2 % Final   Performed at [facility], [address] [place], [place] [address]   Sodium 01/16/2024 136  135 - 145 mmol/L Final   Potassium  01/16/2024 4.3  3.5 - 5.1 mmol/L Final   Chloride 01/16/2024 103  98 - 111 mmol/L Final   CO2 01/16/2024 24  22 - 32 mmol/L Final   Glucose, Bld 01/16/2024 94  70 - 99 mg/dL Final   Glucose reference range applies only to samples taken after fasting for at least 8 hours.   BUN 01/16/2024 7  6 - 20 mg/dL Final   Creatinine, Ser 01/16/2024 0.66  0.44 - 1.00 mg/dL Final   Calcium 96/82/7974 8.7 (L)  8.9 - 10.3 mg/dL Final   GFR, Estimated 01/16/2024 >60  >60 mL/min Final   Comment: (NOTE) Calculated using the CKD-EPI Creatinine Equation (2021)    Anion gap 01/16/2024 9  5 - 15 Final   Performed at [facility], [address], [place], [place] [address]    Allergies: Lamictal [lamotrigine], Maxalt  [rizatriptan ], Morphine  and codeine , Nicotine , Zofran , Chantix  [varenicline  tartrate], Ciprofloxacin , Codeine , Haldol  [haloperidol ], Ibuprofen, Lidoderm  [lidocaine ], Skelaxin [metaxalone], Sulfa antibiotics, Sulfasalazine, Toradol  [ketorolac  tromethamine ], Ambien [zolpidem], Doxycycline , Duloxetine, Ingrezza [valbenazine tosylate], Lyrica [pregabalin], Methadone, Neurontin  [gabapentin ], Other, Prednisone , Amoxicillin, Aripiprazole, Chlorhexidine , Penicillins, and Tramadol   Medications:  PTA Medications  Medication Sig   escitalopram  (LEXAPRO ) 20 MG tablet Take 20 mg by mouth at bedtime.   lurasidone  (LATUDA ) 40 MG TABS tablet Take 40 mg by mouth every evening.   LORazepam  (ATIVAN ) 1 MG tablet Take 1 mg by mouth every 8 (eight) hours as needed for anxiety.   calcium carbonate (TUMS - DOSED IN MG ELEMENTAL CALCIUM) 500 MG chewable tablet Chew 1 tablet by mouth 4 (four) times daily as needed for indigestion or heartburn.   ELESTRIN 0.52 MG/0.87 GM (0.06%) GEL Apply 1 Application topically every evening.   Cholecalciferol (VITAMIN D3 PO) Take 2 each by mouth daily. Gummy   promethazine  (PHENERGAN ) 25 MG suppository Place  1 suppository (25 mg total) rectally every 6 (six) hours as needed for nausea  or vomiting.   methocarbamol  (ROBAXIN ) 500 MG tablet TAKE 1 TABLET BY MOUTH EVERY 8 HOURS AS NEEDED FOR UP TO 7 DAYS   hyoscyamine  (LEVSIN  SL) 0.125 MG SL tablet Place 0.125 mg under the tongue every 4 (four) hours as needed for cramping.   methylPREDNISolone  (MEDROL  DOSEPAK) 4 MG TBPK tablet Take as directed with food   HYDROcodone -acetaminophen  (NORCO/VICODIN) 5-325 MG tablet Take 1 tablet by mouth every 12 (twelve) hours as needed for moderate pain (pain score 4-6).      Medical Decision Making  Observation unit    Recommendations  Based on my evaluation the patient does not appear to have an emergency medical condition.  Gaither Pouch, NP 07/03/24  11:32 PM

## 2024-07-04 ENCOUNTER — Encounter (HOSPITAL_COMMUNITY): Payer: Self-pay

## 2024-07-04 ENCOUNTER — Other Ambulatory Visit: Payer: Self-pay

## 2024-07-04 ENCOUNTER — Inpatient Hospital Stay (HOSPITAL_COMMUNITY)
Admission: AD | Admit: 2024-07-04 | Discharge: 2024-07-05 | DRG: 885 | Disposition: A | Discharge status: Home / Self Care | Source: Intra-hospital

## 2024-07-04 ENCOUNTER — Encounter (HOSPITAL_COMMUNITY): Payer: Self-pay | Admitting: Emergency Medicine

## 2024-07-04 DIAGNOSIS — F17210 Nicotine dependence, cigarettes, uncomplicated: Secondary | ICD-10-CM | POA: Diagnosis present

## 2024-07-04 DIAGNOSIS — Z9151 Personal history of suicidal behavior: Secondary | ICD-10-CM | POA: Diagnosis not present

## 2024-07-04 DIAGNOSIS — Z818 Family history of other mental and behavioral disorders: Secondary | ICD-10-CM

## 2024-07-04 DIAGNOSIS — F4001 Agoraphobia with panic disorder: Secondary | ICD-10-CM | POA: Diagnosis present

## 2024-07-04 DIAGNOSIS — Z888 Allergy status to other drugs, medicaments and biological substances status: Secondary | ICD-10-CM | POA: Diagnosis not present

## 2024-07-04 DIAGNOSIS — Z882 Allergy status to sulfonamides status: Secondary | ICD-10-CM | POA: Diagnosis not present

## 2024-07-04 DIAGNOSIS — Z90711 Acquired absence of uterus with remaining cervical stump: Secondary | ICD-10-CM | POA: Diagnosis not present

## 2024-07-04 DIAGNOSIS — G8929 Other chronic pain: Secondary | ICD-10-CM | POA: Diagnosis present

## 2024-07-04 DIAGNOSIS — R45851 Suicidal ideations: Secondary | ICD-10-CM | POA: Diagnosis present

## 2024-07-04 DIAGNOSIS — Z723 Lack of physical exercise: Secondary | ICD-10-CM

## 2024-07-04 DIAGNOSIS — Z91148 Patient's other noncompliance with medication regimen for other reason: Secondary | ICD-10-CM | POA: Diagnosis not present

## 2024-07-04 DIAGNOSIS — M797 Fibromyalgia: Secondary | ICD-10-CM | POA: Diagnosis present

## 2024-07-04 DIAGNOSIS — Z880 Allergy status to penicillin: Secondary | ICD-10-CM

## 2024-07-04 DIAGNOSIS — F1320 Sedative, hypnotic or anxiolytic dependence, uncomplicated: Secondary | ICD-10-CM | POA: Diagnosis present

## 2024-07-04 DIAGNOSIS — Z8659 Personal history of other mental and behavioral disorders: Secondary | ICD-10-CM | POA: Diagnosis not present

## 2024-07-04 DIAGNOSIS — M069 Rheumatoid arthritis, unspecified: Secondary | ICD-10-CM | POA: Diagnosis present

## 2024-07-04 DIAGNOSIS — F314 Bipolar disorder, current episode depressed, severe, without psychotic features: Secondary | ICD-10-CM | POA: Diagnosis present

## 2024-07-04 DIAGNOSIS — F3110 Bipolar disorder, current episode manic without psychotic features, unspecified: Secondary | ICD-10-CM | POA: Diagnosis not present

## 2024-07-04 DIAGNOSIS — F603 Borderline personality disorder: Secondary | ICD-10-CM | POA: Diagnosis present

## 2024-07-04 DIAGNOSIS — Z885 Allergy status to narcotic agent status: Secondary | ICD-10-CM | POA: Diagnosis not present

## 2024-07-04 DIAGNOSIS — F319 Bipolar disorder, unspecified: Secondary | ICD-10-CM

## 2024-07-04 DIAGNOSIS — Z79899 Other long term (current) drug therapy: Secondary | ICD-10-CM | POA: Diagnosis not present

## 2024-07-04 DIAGNOSIS — F17290 Nicotine dependence, other tobacco product, uncomplicated: Secondary | ICD-10-CM | POA: Diagnosis present

## 2024-07-04 DIAGNOSIS — Z638 Other specified problems related to primary support group: Secondary | ICD-10-CM

## 2024-07-04 LAB — CBC WITH DIFFERENTIAL/PLATELET
Abs Immature Granulocytes: 0.01 K/uL (ref 0.00–0.07)
Basophils Absolute: 0 K/uL (ref 0.0–0.1)
Basophils Relative: 1 %
Eosinophils Absolute: 0.2 K/uL (ref 0.0–0.5)
Eosinophils Relative: 3 %
HCT: 39.3 % (ref 36.0–46.0)
Hemoglobin: 13.2 g/dL (ref 12.0–15.0)
Immature Granulocytes: 0 %
Lymphocytes Relative: 32 %
Lymphs Abs: 1.9 K/uL (ref 0.7–4.0)
MCH: 31.3 pg (ref 26.0–34.0)
MCHC: 33.6 g/dL (ref 30.0–36.0)
MCV: 93.1 fL (ref 80.0–100.0)
Monocytes Absolute: 0.6 K/uL (ref 0.1–1.0)
Monocytes Relative: 11 %
Neutro Abs: 3.2 K/uL (ref 1.7–7.7)
Neutrophils Relative %: 53 %
Platelets: 249 K/uL (ref 150–400)
RBC: 4.22 MIL/uL (ref 3.87–5.11)
RDW: 12 % (ref 11.5–15.5)
WBC: 6 K/uL (ref 4.0–10.5)
nRBC: 0 % (ref 0.0–0.2)

## 2024-07-04 LAB — COMPREHENSIVE METABOLIC PANEL WITH GFR
ALT: 12 U/L (ref 0–44)
AST: 14 U/L — ABNORMAL LOW (ref 15–41)
Albumin: 3.2 g/dL — ABNORMAL LOW (ref 3.5–5.0)
Alkaline Phosphatase: 102 U/L (ref 38–126)
Anion gap: 10 (ref 5–15)
BUN: 10 mg/dL (ref 6–20)
CO2: 22 mmol/L (ref 22–32)
Calcium: 8.7 mg/dL — ABNORMAL LOW (ref 8.9–10.3)
Chloride: 107 mmol/L (ref 98–111)
Creatinine, Ser: 0.63 mg/dL (ref 0.44–1.00)
GFR, Estimated: >60 mL/min (ref >60)
Glucose, Bld: 84 mg/dL (ref 70–99)
Potassium: 3.8 mmol/L (ref 3.5–5.1)
Sodium: 139 mmol/L (ref 135–145)
Total Bilirubin: 0.4 mg/dL (ref 0.0–1.2)
Total Protein: 5.8 g/dL — ABNORMAL LOW (ref 6.5–8.1)

## 2024-07-04 LAB — POCT URINE DRUG SCREEN - MANUAL ENTRY (I-SCREEN)
POC Amphetamine UR: NOT DETECTED
POC Buprenorphine (BUP): NOT DETECTED
POC Cocaine UR: NOT DETECTED
POC Marijuana UR: POSITIVE — AB
POC Methadone UR: NOT DETECTED
POC Methamphetamine UR: NOT DETECTED
POC Morphine: POSITIVE — AB
POC Oxazepam (BZO): POSITIVE — AB
POC Oxycodone UR: NOT DETECTED
POC Secobarbital (BAR): NOT DETECTED

## 2024-07-04 LAB — TSH: TSH: 1.781 u[IU]/mL (ref 0.350–4.500)

## 2024-07-04 LAB — SARS CORONAVIRUS 2 BY RT PCR: SARS Coronavirus 2 by RT PCR: NEGATIVE

## 2024-07-04 LAB — POC URINE PREG, ED: Preg Test, Ur: NEGATIVE

## 2024-07-04 LAB — ETHANOL: Alcohol, Ethyl (B): <15 mg/dL (ref <15)

## 2024-07-04 MED ORDER — OLANZAPINE 10 MG IM SOLR: 5.0000 mg | Freq: Three times a day (TID) | INTRAMUSCULAR | Status: DC | PRN

## 2024-07-04 MED ORDER — LORAZEPAM 0.5 MG PO TABS: 0.5000 mg | ORAL_TABLET | Freq: Every day | ORAL | Status: DC

## 2024-07-04 MED ORDER — ADULT MULTIVITAMIN W/MINERALS CH
1.0000 | ORAL_TABLET | Freq: Every day | ORAL | Status: DC
  Filled 2024-07-04: qty 1

## 2024-07-04 MED ORDER — LORAZEPAM 0.5 MG PO TABS
0.5000 mg | ORAL_TABLET | Freq: Two times a day (BID) | ORAL | Status: DC
Start: 2024-07-04 — End: 2024-07-04
  Administered 2024-07-04: 0.5 mg via ORAL
  Filled 2024-07-04: qty 1

## 2024-07-04 MED ORDER — LORAZEPAM 0.5 MG PO TABS
0.5000 mg | ORAL_TABLET | Freq: Once | ORAL | Status: AC
  Administered 2024-07-04: 0.5 mg via ORAL
  Filled 2024-07-04: qty 1

## 2024-07-04 MED ORDER — LOPERAMIDE HCL 2 MG PO CAPS: 2.0000 mg | ORAL_CAPSULE | ORAL | Status: DC | PRN

## 2024-07-04 MED ORDER — BUPROPION HCL 75 MG PO TABS
75.0000 mg | ORAL_TABLET | Freq: Once | ORAL | Status: AC
  Administered 2024-07-04: 75 mg via ORAL
  Filled 2024-07-04: qty 1

## 2024-07-04 MED ORDER — LURASIDONE HCL 40 MG PO TABS
40.0000 mg | ORAL_TABLET | Freq: Every evening | ORAL | Status: DC
  Administered 2024-07-04: 40 mg via ORAL
  Filled 2024-07-04: qty 1

## 2024-07-04 MED ORDER — THIAMINE HCL 100 MG/ML IJ SOLN: 100.0000 mg | Freq: Once | INTRAMUSCULAR | Status: DC

## 2024-07-04 MED ORDER — OLANZAPINE 10 MG IM SOLR
10.0000 mg | Freq: Three times a day (TID) | INTRAMUSCULAR | Status: DC | PRN
Start: 2024-07-04 — End: 2024-07-05

## 2024-07-04 MED ORDER — LORAZEPAM 0.5 MG PO TABS
0.5000 mg | ORAL_TABLET | Freq: Two times a day (BID) | ORAL | Status: DC
  Administered 2024-07-04: 0.5 mg via ORAL
  Filled 2024-07-04: qty 1

## 2024-07-04 MED ORDER — HYDROXYZINE HCL 25 MG PO TABS
25.0000 mg | ORAL_TABLET | Freq: Four times a day (QID) | ORAL | Status: DC | PRN
  Filled 2024-07-04: qty 1

## 2024-07-04 MED ORDER — MAGNESIUM HYDROXIDE 400 MG/5ML PO SUSP: 30.0000 mL | Freq: Every day | ORAL | Status: DC | PRN

## 2024-07-04 MED ORDER — ALUM & MAG HYDROXIDE-SIMETH 200-200-20 MG/5ML PO SUSP: 30.0000 mL | ORAL | Status: DC | PRN

## 2024-07-04 MED ORDER — LORAZEPAM 0.5 MG PO TABS: 0.5000 mg | ORAL_TABLET | Freq: Four times a day (QID) | ORAL | Status: DC | PRN

## 2024-07-04 MED ORDER — LORAZEPAM 0.5 MG PO TABS
0.5000 mg | ORAL_TABLET | Freq: Four times a day (QID) | ORAL | Status: DC | PRN
  Administered 2024-07-04: 0.5 mg via ORAL
  Filled 2024-07-04: qty 1

## 2024-07-04 MED ORDER — HYDROXYZINE HCL 25 MG PO TABS
25.0000 mg | ORAL_TABLET | Freq: Four times a day (QID) | ORAL | Status: DC | PRN
Start: 2024-07-04 — End: 2024-07-04

## 2024-07-04 MED ORDER — TRAZODONE HCL 50 MG PO TABS
50.0000 mg | ORAL_TABLET | Freq: Every evening | ORAL | Status: DC | PRN
  Administered 2024-07-04: 50 mg via ORAL
  Filled 2024-07-04: qty 1

## 2024-07-04 MED ORDER — ACETAMINOPHEN 325 MG PO TABS
650.0000 mg | ORAL_TABLET | Freq: Four times a day (QID) | ORAL | Status: DC | PRN
  Administered 2024-07-04 – 2024-07-05 (×3): 650 mg via ORAL
  Filled 2024-07-04 (×3): qty 2

## 2024-07-04 MED ORDER — LURASIDONE HCL 40 MG PO TABS: 40.0000 mg | ORAL_TABLET | Freq: Every evening | ORAL | Status: DC

## 2024-07-04 MED ORDER — ADULT MULTIVITAMIN W/MINERALS CH
1.0000 | ORAL_TABLET | Freq: Every day | ORAL | Status: DC
  Administered 2024-07-04: 1 via ORAL
  Filled 2024-07-04 (×2): qty 1

## 2024-07-04 MED ORDER — METHOCARBAMOL 500 MG PO TABS: 500.0000 mg | ORAL_TABLET | Freq: Three times a day (TID) | ORAL | Status: DC | PRN

## 2024-07-04 MED ORDER — THIAMINE MONONITRATE 100 MG PO TABS: 100.0000 mg | ORAL_TABLET | Freq: Every day | ORAL | Status: DC

## 2024-07-04 MED ORDER — NICOTINE 14 MG/24HR TD PT24
14.0000 mg | MEDICATED_PATCH | Freq: Once | TRANSDERMAL | Status: DC
  Administered 2024-07-04: 14 mg via TRANSDERMAL
  Filled 2024-07-04: qty 1

## 2024-07-04 MED ORDER — OLANZAPINE 5 MG PO TBDP: 5.0000 mg | ORAL_TABLET | Freq: Three times a day (TID) | ORAL | Status: DC | PRN

## 2024-07-04 MED ORDER — NICOTINE 21 MG/24HR TD PT24
21.0000 mg | MEDICATED_PATCH | Freq: Every day | TRANSDERMAL | Status: DC
Start: 2024-07-05 — End: 2024-07-05
  Administered 2024-07-05: 21 mg via TRANSDERMAL
  Filled 2024-07-04: qty 1

## 2024-07-04 NOTE — Plan of Care (Signed)
   Problem: Education: Goal: Emotional status will improve Outcome: Progressing Goal: Mental status will improve Outcome: Progressing Goal: Verbalization of understanding the information provided will improve Outcome: Progressing

## 2024-07-04 NOTE — BH IP Treatment Plan (Deleted)
 Interdisciplinary Treatment and Diagnostic Plan Update  07/04/2024 Time of Session: 10:40 AM Gina [name] MRN: [id number]  Principal Diagnosis: Bipolar 1 disorder, depressed, severe (HCC)  Secondary Diagnoses: Principal Problem:   Bipolar 1 disorder, depressed, severe (HCC)   Current Medications:  Current Facility-Administered Medications  Medication Dose Route Frequency Provider Last Rate Last Admin   acetaminophen  (TYLENOL ) tablet 650 mg  650 mg Oral Q6H PRN Arloa Suzen RAMAN, NP   650 mg at 07/04/24 1826   alum & mag hydroxide-simeth (MAALOX/MYLANTA) 200-200-20 MG/5ML suspension 30 mL  30 mL Oral Q4H PRN Arloa Suzen RAMAN, NP       hydrOXYzine  (ATARAX ) tablet 25 mg  25 mg Oral Q6H PRN Arloa Suzen RAMAN, NP       LORazepam  (ATIVAN ) tablet 0.5 mg  0.5 mg Oral Q6H PRN Arloa Suzen RAMAN, NP   0.5 mg at 07/04/24 1826   LORazepam  (ATIVAN ) tablet 0.5 mg  0.5 mg Oral BID Arloa Suzen RAMAN, NP       Followed by   Gina [name] ON 07/06/2024] LORazepam  (ATIVAN ) tablet 0.5 mg  0.5 mg Oral Daily Arloa Suzen RAMAN, NP       lurasidone  (LATUDA ) tablet 40 mg  40 mg Oral QPM Arloa Suzen RAMAN, NP   40 mg at 07/04/24 1826   magnesium  hydroxide (MILK OF MAGNESIA) suspension 30 mL  30 mL Oral Daily PRN Arloa Suzen RAMAN, NP       [START ON 07/05/2024] multivitamin with minerals tablet 1 tablet  1 tablet Oral Daily Arloa Suzen RAMAN, NP       [START ON 07/05/2024] nicotine  (NICODERM CQ  - dosed in mg/24 hours) patch 21 mg  21 mg Transdermal Q0600 Arloa Suzen RAMAN, NP       OLANZapine  (ZYPREXA ) injection 10 mg  10 mg Intramuscular TID PRN Arloa Suzen RAMAN, NP       OLANZapine  (ZYPREXA ) injection 5 mg  5 mg Intramuscular TID PRN Arloa Suzen RAMAN, NP       OLANZapine  zydis (ZYPREXA ) disintegrating tablet 5 mg  5 mg Oral TID PRN Arloa Suzen RAMAN, NP       traZODone  (DESYREL ) tablet 50 mg  50 mg Oral QHS PRN Arloa Suzen RAMAN, NP       PTA Medications: Medications Prior to Admission  Medication  Sig Dispense Refill Last Dose/Taking   calcium carbonate (TUMS - DOSED IN MG ELEMENTAL CALCIUM) 500 MG chewable tablet Chew 1 tablet by mouth 4 (four) times daily as needed for indigestion or heartburn.      Cholecalciferol (VITAMIN D3 PO) Take 2 each by mouth daily. Gummy      HYDROcodone -acetaminophen  (NORCO/VICODIN) 5-325 MG tablet Take 1 tablet by mouth every 12 (twelve) hours as needed for moderate pain (pain score 4-6). 30 tablet 0    hyoscyamine  (LEVSIN  SL) 0.125 MG SL tablet Place 0.125 mg under the tongue every 4 (four) hours as needed for cramping.      LORazepam  (ATIVAN ) 1 MG tablet Take 1 mg by mouth every 8 (eight) hours as needed for anxiety.      lurasidone  (LATUDA ) 40 MG TABS tablet Take 40 mg by mouth every evening.      methocarbamol  (ROBAXIN ) 500 MG tablet TAKE 1 TABLET BY MOUTH EVERY 8 HOURS AS NEEDED FOR UP TO 7 DAYS (Patient taking differently: Take 500 mg by mouth every 8 (eight) hours as needed for muscle spasms.) 21 tablet 0    Oyster Shell (OYSTER CALCIUM) 500 MG TABS tablet Take  500 mg of elemental calcium by mouth daily.      PRESCRIPTION MEDICATION Apply 1-2 Applications topically at bedtime. Testosterone 4% cream      promethazine  (PHENERGAN ) 25 MG tablet Take 25 mg by mouth every 6 (six) hours as needed for nausea or vomiting.       Patient Stressors: Financial difficulties   Marital or family conflict   Medication change or noncompliance    Patient Strengths: Manufacturing systems engineer   Treatment Modalities: Medication Management, Group therapy, Case management,  1 to 1 session with clinician, Psychoeducation, Recreational therapy.   Physician Treatment Plan for Primary Diagnosis: Bipolar 1 disorder, depressed, severe (HCC) Long Term Goal(s):     Short Term Goals:    Medication Management: Evaluate patient's response, side effects, and tolerance of medication regimen.  Therapeutic Interventions: 1 to 1 sessions, Unit Group sessions and Medication  administration.  Evaluation of Outcomes: Not Progressing  Physician Treatment Plan for Secondary Diagnosis: Principal Problem:   Bipolar 1 disorder, depressed, severe (HCC)  Long Term Goal(s):     Short Term Goals:       Medication Management: Evaluate patient's response, side effects, and tolerance of medication regimen.  Therapeutic Interventions: 1 to 1 sessions, Unit Group sessions and Medication administration.  Evaluation of Outcomes: Not Progressing   RN Treatment Plan for Primary Diagnosis: Bipolar 1 disorder, depressed, severe (HCC) Long Term Goal(s): Knowledge of disease and therapeutic regimen to maintain health will improve  Short Term Goals: Ability to remain free from injury will improve, Ability to verbalize frustration and anger appropriately will improve, Ability to demonstrate self-control, Ability to participate in decision making will improve, Ability to verbalize feelings will improve, Ability to disclose and discuss suicidal ideas, Ability to identify and develop effective coping behaviors will improve, and Compliance with prescribed medications will improve  Medication Management: RN will administer medications as ordered by provider, will assess and evaluate patient's response and provide education to patient for prescribed medication. RN will report any adverse and/or side effects to prescribing provider.  Therapeutic Interventions: 1 on 1 counseling sessions, Psychoeducation, Medication administration, Evaluate responses to treatment, Monitor vital signs and CBGs as ordered, Perform/monitor CIWA, COWS, AIMS and Fall Risk screenings as ordered, Perform wound care treatments as ordered.  Evaluation of Outcomes: Not Progressing   LCSW Treatment Plan for Primary Diagnosis: Bipolar 1 disorder, depressed, severe (HCC) Long Term Goal(s): Safe transition to appropriate next level of care at discharge, Engage patient in therapeutic group addressing interpersonal  concerns.  Short Term Goals: Engage patient in aftercare planning with referrals and resources, Increase social support, Increase ability to appropriately verbalize feelings, Increase emotional regulation, Facilitate acceptance of mental health diagnosis and concerns, Facilitate patient progression through stages of change regarding substance use diagnoses and concerns, Identify triggers associated with mental health/substance abuse issues, and Increase skills for wellness and recovery  Therapeutic Interventions: Assess for all discharge needs, 1 to 1 time with Social worker, Explore available resources and support systems, Assess for adequacy in community support network, Educate family and significant other(s) on suicide prevention, Complete Psychosocial Assessment, Interpersonal group therapy.  Evaluation of Outcomes: Not Progressing   Progress in Treatment: Attending groups: No. Participating in groups: No. Taking medication as prescribed: Yes. Toleration medication: Yes. Family/Significant other contact made: consents are pending Patient understands diagnosis: Yes. Discussing patient identified problems/goals with staff: Yes. Medical problems stabilized or resolved: Yes. Denies suicidal/homicidal ideation: Yes. Issues/concerns per patient self-inventory: No.  New problem(s) identified:  No  New Short  Term/Long Term Goal(s):    medication stabilization, elimination of SI thoughts, development of comprehensive mental wellness plan.    Patient Goals:     Discharge Plan or Barriers:   Reason for Continuation of Hospitalization: {[facility] Reasons for continued hospitalization:22604}  Estimated Length of Stay:  Last 3 [place] Suicide Severity Risk Score: Flowsheet Row Admission (Current) [facility] from 07[facility]03/2024 in [facility] ED from 05/21/2024 in [facility] at [facility]  C-SSRS RISK CATEGORY No Risk No Risk No Risk    Last [facility] 2/9 Scores:    04/30/2024   11:22 AM 12/06/2019    1:07 PM 05/25/2019    4:33 PM  Depression screen PHQ 2/9  Decreased Interest 0 0 0  Down, Depressed, Hopeless 0 0 1  PHQ - 2 Score 0 0 1  Altered sleeping 3    Tired, decreased energy 3    Change in appetite 3    Feeling bad or failure about yourself  0    Trouble concentrating 0    Moving slowly or fidgety/restless 0    Suicidal thoughts 0    PHQ-9 Score 9    Difficult doing work/chores Not difficult at all      Scribe for Treatment Team: Cassandr Cederberg O Cinthya Bors, LCSWA 07/04/2024 7:20 PM

## 2024-07-04 NOTE — Discharge Instructions (Addendum)
 [facility]

## 2024-07-04 NOTE — ED Notes (Signed)
 Stable. Vol Transferring to [facility]   via  safe trasnsport  all belongings and valuable sent with with patient and safe transport Denied current SI plan and intent.  Denied HI and A/V hallucinations

## 2024-07-04 NOTE — ED Notes (Signed)
 Pt observed lying in bed. Eyes closed respirations even and non labored. NAD q 15 minute observations continue for safety.

## 2024-07-04 NOTE — ED Notes (Addendum)
 Patient reported that she has diagnosis of bipolar I disorder, MDD and GAD. Beside, PMH problems she has also some other medical issues for which she is going under treatment. Patient is calm, quiet and cooperative. Alert X 4. She is coherent in conversation. Denied SI/HI and AVH. She reported having poor appetite related to her medical condition. She said, I am not sleeping well and then Trazodone  50 mg PRN administered. Though process seems linear/goal oriented. She is positive for all the symptoms of depression. She denied using alcohol and illicit drugs. She has been on some psychotropic medications such as Latuda , Lexapro , Haloperidol , Abilify, Wellbutrin , etc. Patient is coherent in conversation. We continue caring

## 2024-07-04 NOTE — ED Notes (Signed)
 Patient is sleeping still and no issue at this time.

## 2024-07-04 NOTE — ED Notes (Signed)
 Pt c/o headache. Provided Tylenol .  Pt states,I am supposed to be taking Norco, I guess y'all don;t have that here.  Pt also reported that she chews her pills because she can't swallow pills.  Pt observed eating and drinking fluids without issues

## 2024-07-04 NOTE — ED Notes (Addendum)
 Unable to draw blood work at this time 2x sticks from Conservation officer, nature and advised by nurse to try in the morning after pt consume fluids.

## 2024-07-04 NOTE — Group Note (Signed)
 Date:  07/04/2024 Time:  5:12 PM  Group Topic/Focus:  Making Healthy Choices:   The focus of this group is to help patients identify negative/unhealthy choices they were using prior to admission and identify positive/healthier coping strategies to replace them upon discharge.    Participation Level:  Did Not Attend    Annalee Larch 07/04/2024, 5:12 PM

## 2024-07-04 NOTE — ED Notes (Signed)
 After medication, patient is sleeping, no problem noted

## 2024-07-04 NOTE — ED Notes (Incomplete)
 Patient has diagnosis of bipolar I disorder, MDD and anxiety. Beside PMH, she has some medical diagnosis that she is going under treatments. Patient Alert X 4. Coherent in interaction. Thought process appears to be linear/goal oriented. Denied SI/HI and AVH. Reported having problem with sleep. Reported poor appetite related to medical issues. She has been on psychotropic medication medications such as Latuda , lamotrigine, Aripiprazole, Lexapro ,
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
## 2024-07-04 NOTE — ED Notes (Signed)
 Pt guarded. Irritable affect When asked how she was feeling this am compared to when she arrived. Pt looked at this nurse blankly and stated all I did was sleep.   Discussed with patient importance of sleep and mental health.  She then stated. I guess I do feel better.. Pt denied current SI plan and intent.  Denied Hi and A/V hallucinations.  Pt reported wanting help with increased symptoms of depression and anxiety Pt provided with breakfast.

## 2024-07-04 NOTE — Tx Team (Signed)
 Initial Treatment Plan 07/04/2024 6:36 PM [name] FMW:[id number]    PATIENT STRESSORS: Financial difficulties   Marital or family conflict   Medication change or noncompliance     PATIENT STRENGTHS: Communication skills    PATIENT IDENTIFIED PROBLEMS:                      DISCHARGE CRITERIA:  Ability to meet basic life and health needs Improved stabilization in mood, thinking, and/or behavior Medical problems require only outpatient monitoring  PRELIMINARY DISCHARGE PLAN: Return to previous living arrangement  PATIENT/FAMILY INVOLVEMENT: This treatment plan has been presented to and reviewed with the patient, Gina [name].  The patient and family have been given the opportunity to ask questions and make suggestions.  Elouise Wolm Morel, RN 07/04/2024, 6:36 PM

## 2024-07-04 NOTE — ED Provider Notes (Signed)
 FBC/OBS ASAP Discharge Summary  Date and Time: 07/04/2024 8:15 AM  Name: Gina [name]  MRN:  [id number]   Discharge Diagnoses:  Final diagnoses:  Bipolar affective disorder, remission status unspecified (HCC)  H/O medication noncompliance  Medication course changed    Subjective: Gina [name], 48 y/o female with a history of bipolar disorder, MDD, GAD.  Presented to [facility], accompanied by her husband.  Per the patient she has bipolar disorder and she has not been taking her medicine on a regular basis.  According to her she was prescribed Latuda  and Wellbutrin  by her psychiatrist however the Wellbutrin  is extended release and she does not like to swallow medicines so she stopped taking it altogether.   This is inconsistent with her consistent use of ativan  and prescription.  Pt was agreeable to interview. Shared her history of bipolar disorder, said that she has had worsening symptoms of depression for the last few weeks. Increasing irritability, poor sleep, rumination, argumentation.    Collateral information obtained Gina [name], phone number: [phone], patient's husband) Patient granted permission to speak to contact person without restrictions. Date of call: 07/04/2024 Time of call: 1500 Number of call attempts:1 Confirmed patient details via: Name, Birth date , and Relationship  Main Content: patient has been all over the place. Extremely irritable. Increased crying spells. Patient on 9/2.   Known medication allergies? Many.  Is contact aware of statements from patient that indicate intent/plan to self harm? Patient has inidicated she would not fight it if something bad happened. Passive SI. No active. No plans. Is contact aware of patient's adherence to prescribed medications?  Collateral contact denies presence of firearms or large stockpiles of pills at home.  During this conversation, I explained in simple terms the patient's mental health condition, answered  questions pertaining to the patient's current treatment and provided updates, outlined the treatment plan moving forward, provided guidance on safety planning (ie securing firearms, safe medication allocation, etc), coordinated plans for future disposition and recommended follow-up, and directed involved parties to available resources in the event of patient decompensating.  07/04/2024 3:05 PM  Stay Summary: Patient arrived on 9/2 with husband Glendia saying that her bipolar disorder was out of whack. She had difficulty sleeping, but was pleasant and agreeable to go voluntary to [facility] for medication management and therapy.  Total Time spent with patient: 30 minutes  Past Psychiatric History: Bipolar disorder, high risk medication use, anxiety, fibromyalgia, eating disorder unspecified,  Past Medical History: endometriosis, chronic fatigue,   Family History: father has HLD, HTN, mother has depression, arthritis Family Psychiatric History: mother with depression Social History: Lives with husband. Tobacco Cessation:  A prescription for an FDA-approved tobacco cessation medication was offered at discharge and the patient refused  Current Medications:  Current Facility-Administered Medications  Medication Dose Route Frequency Provider Last Rate Last Admin   acetaminophen  (TYLENOL ) tablet 650 mg  650 mg Oral Q6H PRN Trudy Carwin, NP       alum & mag hydroxide-simeth (MAALOX/MYLANTA) 200-200-20 MG/5ML suspension 30 mL  30 mL Oral Q4H PRN Trudy Carwin, NP       lurasidone  (LATUDA ) tablet 40 mg  40 mg Oral QPM Trudy Carwin, NP       magnesium  hydroxide (MILK OF MAGNESIA) suspension 30 mL  30 mL Oral Daily PRN Trudy Carwin, NP       methocarbamol  (ROBAXIN ) tablet 500 mg  500 mg Oral Q8H PRN Trudy Carwin, NP       OLANZapine  (ZYPREXA ) injection 10 mg  10 mg Intramuscular TID PRN Trudy Carwin, NP       OLANZapine  (ZYPREXA ) injection 5 mg  5 mg Intramuscular TID PRN Trudy Carwin, NP       OLANZapine   zydis (ZYPREXA ) disintegrating tablet 5 mg  5 mg Oral TID PRN Trudy Carwin, NP       traZODone  (DESYREL ) tablet 50 mg  50 mg Oral QHS PRN Trudy Carwin, NP   50 mg at 07/04/24 0159   Current Outpatient Medications  Medication Sig Dispense Refill   calcium carbonate (TUMS - DOSED IN MG ELEMENTAL CALCIUM) 500 MG chewable tablet Chew 1 tablet by mouth 4 (four) times daily as needed for indigestion or heartburn.     Cholecalciferol (VITAMIN D3 PO) Take 2 each by mouth daily. Gummy     ELESTRIN 0.52 MG/0.87 GM (0.06%) GEL Apply 1 Application topically every evening.     escitalopram  (LEXAPRO ) 20 MG tablet Take 20 mg by mouth at bedtime.     HYDROcodone -acetaminophen  (NORCO/VICODIN) 5-325 MG tablet Take 1 tablet by mouth every 12 (twelve) hours as needed for moderate pain (pain score 4-6). 30 tablet 0   hyoscyamine  (LEVSIN  SL) 0.125 MG SL tablet Place 0.125 mg under the tongue every 4 (four) hours as needed for cramping.     LORazepam  (ATIVAN ) 1 MG tablet Take 1 mg by mouth every 8 (eight) hours as needed for anxiety.     lurasidone  (LATUDA ) 40 MG TABS tablet Take 40 mg by mouth every evening.     methocarbamol  (ROBAXIN ) 500 MG tablet TAKE 1 TABLET BY MOUTH EVERY 8 HOURS AS NEEDED FOR UP TO 7 DAYS 21 tablet 0   methylPREDNISolone  (MEDROL  DOSEPAK) 4 MG TBPK tablet Take as directed with food 21 tablet 0   promethazine  (PHENERGAN ) 25 MG suppository Place 1 suppository (25 mg total) rectally every 6 (six) hours as needed for nausea or vomiting. 12 each 0    PTA Medications:  Facility Ordered Medications  Medication   acetaminophen  (TYLENOL ) tablet 650 mg   alum & mag hydroxide-simeth (MAALOX/MYLANTA) 200-200-20 MG/5ML suspension 30 mL   magnesium  hydroxide (MILK OF MAGNESIA) suspension 30 mL   OLANZapine  zydis (ZYPREXA ) disintegrating tablet 5 mg   OLANZapine  (ZYPREXA ) injection 5 mg   OLANZapine  (ZYPREXA ) injection 10 mg   traZODone  (DESYREL ) tablet 50 mg   [COMPLETED] buPROPion  (WELLBUTRIN )  tablet 75 mg   lurasidone  (LATUDA ) tablet 40 mg   methocarbamol  (ROBAXIN ) tablet 500 mg   PTA Medications  Medication Sig   escitalopram  (LEXAPRO ) 20 MG tablet Take 20 mg by mouth at bedtime.   lurasidone  (LATUDA ) 40 MG TABS tablet Take 40 mg by mouth every evening.   LORazepam  (ATIVAN ) 1 MG tablet Take 1 mg by mouth every 8 (eight) hours as needed for anxiety.   calcium carbonate (TUMS - DOSED IN MG ELEMENTAL CALCIUM) 500 MG chewable tablet Chew 1 tablet by mouth 4 (four) times daily as needed for indigestion or heartburn.   ELESTRIN 0.52 MG/0.87 GM (0.06%) GEL Apply 1 Application topically every evening.   Cholecalciferol (VITAMIN D3 PO) Take 2 each by mouth daily. Gummy   promethazine  (PHENERGAN ) 25 MG suppository Place 1 suppository (25 mg total) rectally every 6 (six) hours as needed for nausea or vomiting.   methocarbamol  (ROBAXIN ) 500 MG tablet TAKE 1 TABLET BY MOUTH EVERY 8 HOURS AS NEEDED FOR UP TO 7 DAYS   hyoscyamine  (LEVSIN  SL) 0.125 MG SL tablet Place 0.125 mg under the tongue every 4 (four) hours as needed  for cramping.   methylPREDNISolone  (MEDROL  DOSEPAK) 4 MG TBPK tablet Take as directed with food   HYDROcodone -acetaminophen  (NORCO/VICODIN) 5-325 MG tablet Take 1 tablet by mouth every 12 (twelve) hours as needed for moderate pain (pain score 4-6).       04/30/2024   11:22 AM 12/06/2019    1:07 PM 05/25/2019    4:33 PM  Depression screen PHQ 2/9  Decreased Interest 0 0 0  Down, Depressed, Hopeless 0 0 1  PHQ - 2 Score 0 0 1  Altered sleeping 3    Tired, decreased energy 3    Change in appetite 3    Feeling bad or failure about yourself  0    Trouble concentrating 0    Moving slowly or fidgety/restless 0    Suicidal thoughts 0    PHQ-9 Score 9    Difficult doing work/chores Not difficult at all      Flowsheet Row ED from 07/03/2024 in [facility] ED from 05/21/2024 in [facility] Emergency Department at [facility] ED from 05/16/2024  in [facility] Emergency Department at [facility]  C-SSRS RISK CATEGORY No Risk No Risk No Risk    Musculoskeletal  Strength & Muscle Tone: within normal limits Gait & Station: normal Patient leans: N/A  Psychiatric Specialty Exam  Presentation  General Appearance:  Appropriate for Environment  Eye Contact: Good  Speech: Clear and Coherent  Speech Volume: Normal  Handedness: Right   Mood and Affect  Mood: Anxious  Affect: Congruent   Thought Process  Thought Processes: Coherent  Descriptions of Associations:Intact  Orientation:Full (Time, Place and Person)  Thought Content:WDL  Diagnosis of Schizophrenia or Schizoaffective disorder in past: No    Hallucinations:Hallucinations: None  Ideas of Reference:None  Suicidal Thoughts:Suicidal Thoughts: Yes, Passive SI Passive Intent and/or Plan: Without Plan; Without Intent  Homicidal Thoughts:Homicidal Thoughts: No   Sensorium  Memory: Immediate Fair  Judgment: Fair  Insight: Fair   Art therapist  Concentration: Fair  Attention Span: Fair  Recall: Fair  Fund of Knowledge: Fair  Language: Fair   Psychomotor Activity  Psychomotor Activity: Psychomotor Activity: Normal   Assets  Assets: Desire for Improvement   Sleep  Sleep: Sleep: Fair  No Safety Checks orders active in given range  Nutritional Assessment (For OBS and FBC admissions only) Has the patient had a weight loss or gain of 10 pounds or more in the last 3 months?: No Has the patient had a decrease in food intake/or appetite?: No Does the patient have dental problems?: No Does the patient have eating habits or behaviors that may be indicators of an eating disorder including binging or inducing vomiting?: No Has the patient recently lost weight without trying?: 0 Has the patient been eating poorly because of a decreased appetite?: 0 Malnutrition Screening Tool Score: 0    Physical Exam  Physical  Exam Vitals and nursing note reviewed.  Constitutional:      Appearance: She is obese.  HENT:     Head: Normocephalic.  Pulmonary:     Effort: Pulmonary effort is normal.  Skin:    General: Skin is warm and dry.  Neurological:     Mental Status: She is alert.     Gait: Gait abnormal.  Psychiatric:        Attention and Perception: Attention normal.        Mood and Affect: Mood is depressed. Affect is labile.        Speech: Speech normal.  Behavior: Behavior is cooperative.        Thought Content: Thought content is not paranoid or delusional. Thought content includes suicidal ideation. Thought content does not include homicidal ideation. Thought content does not include suicidal plan.        Cognition and Memory: Cognition and memory normal.        Judgment: Judgment is impulsive.    Review of Systems  Musculoskeletal:  Positive for back pain and joint pain.  Neurological:  Positive for tremors.  Psychiatric/Behavioral:  Positive for depression and suicidal ideas. Negative for hallucinations, memory loss and substance abuse. The patient is nervous/anxious. The patient does not have insomnia.    Blood pressure 109/75, pulse 99, temperature 98.5 F (36.9 C), temperature source Oral, resp. rate 20, last menstrual period 01/01/2004, SpO2 97%. There is no height or weight on file to calculate BMI.  Demographic Factors:  Caucasian and Unemployed  Loss Factors: Decline in physical health  Historical Factors: Prior suicide attempts, Family history of mental illness or substance abuse, and Impulsivity  Risk Reduction Factors:   Sense of responsibility to family, Living with another person, especially a relative, and Positive social support  Continued Clinical Symptoms:  Panic Attacks Bipolar Disorder:   Depressive phase Alcohol/Substance Abuse/Dependencies Chronic Pain Previous Psychiatric Diagnoses and Treatments Medical Diagnoses and Treatments/Surgeries  Cognitive  Features That Contribute To Risk:  Polarized thinking    Suicide Risk:  Moderate:  Frequent suicidal ideation with limited intensity, and duration, some specificity in terms of plans, no associated intent, good self-control, limited dysphoria/symptomatology, some risk factors present, and identifiable protective factors, including available and accessible social support.  Plan Of Care/Follow-up recommendations:  To [facility]  Disposition: [facility]. Patient will require Ativan  detox. patient would like to start on an antidepressant, suggest duloxetine due to her chronic pain conditions. Could also consider gabapentin .   Lynwood Morene Lavone Delsie, MD 07/04/2024, 8:15 AM

## 2024-07-05 DIAGNOSIS — F314 Bipolar disorder, current episode depressed, severe, without psychotic features: Principal | ICD-10-CM

## 2024-07-05 MED ORDER — METHOCARBAMOL 500 MG PO TABS
500.0000 mg | ORAL_TABLET | Freq: Once | ORAL | Status: AC
  Administered 2024-07-05: 500 mg via ORAL
  Filled 2024-07-05: qty 1

## 2024-07-05 NOTE — Progress Notes (Signed)
   07/05/24 0900  Psych Admission Type (Psych Patients Only)  Admission Status Voluntary  Psychosocial Assessment  Patient Complaints Anxiety  Eye Contact Fair  Facial Expression Flat  Affect Anxious;Labile  Speech Logical/coherent  Interaction Assertive  Motor Activity Other (Comment) (WDL)  Appearance/Hygiene Unremarkable  Behavior Characteristics Appropriate to situation  Mood Anxious  Thought Process  Coherency WDL  Content WDL  Delusions None reported or observed  Perception WDL  Hallucination None reported or observed  Judgment WDL  Confusion None  Danger to Self  Current suicidal ideation? Denies  Danger to Others  Danger to Others None reported or observed

## 2024-07-05 NOTE — Progress Notes (Signed)
 Patient ID: Gina [name], female   DOB: 08/10/1976, 48 y.o.   MRN: [id number] Discharge instructions, follow up appointments and medications reviewed, pt verbalized understanding. Belongings returned, pt dc home.
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
## 2024-07-05 NOTE — Progress Notes (Signed)
  [facility] Adult Case Management Discharge Plan :  Will you be returning to the same living situation after discharge:  Yes,  pt returning home with family. At discharge, do you have transportation home?: Yes,  pt reported her father will be providing transportation. Do you have the ability to pay for your medications: Yes,  pt is insured - [facility] Medicare  Release of information consent forms completed and in the chart;  Patient's signature needed at discharge.  Patient to Follow up at:  Follow-up Information     [name], MD Follow up on 07/12/2024.   Specialty: Internal Medicine Why: You have an appointment on 07/12/24 at 10:00 am. Contact information: [address] [place] [address] [phone]         BEHAVIORAL HEALTH PARTIAL HOSPITALIZATION PROGRAM Follow up on 07/09/2024.   Specialty: Behavioral Health Why: You are scheduled for an assessment for the Partial Hospitalization Program on Monday, 9/8 at 10:00 am. PHP is group therapy that meets Mon-Thur from 9am-2pm and Friday from 9am-1pm and lasts for approximately 2-3 weeks. This assessment appointment will last approximately 1-1.5 hours and will be virtual via MyChart. If you need to cancel or reschedule, please call [phone].  *Please make sure she knows the program is in-person while the assessment for it Monday will be virtual Contact information: [address] [place]  [address] [phone]                Next level of care provider has access to [facility] Link:no  Safety Planning and Suicide Prevention discussed: Yes,  completed with Tyquasia Pant (husband) [phone].     Has patient been referred to the Quitline?: Patient does not use tobacco/nicotine  products  Patient has been referred for addiction treatment: No known substance use disorder.  Diron Haddon M Jama Mcmiller, LCSWA 07/05/2024, 1:20 PM

## 2024-07-05 NOTE — Plan of Care (Signed)
  Problem: Education: Goal: Knowledge of [facility] Education information/materials will improve Outcome: Completed/Met Goal: Emotional status will improve Outcome: Completed/Met Goal: Mental status will improve 07/05/2024 1244 by Deidre Sartorius, RN Outcome: Completed/Met 07/05/2024 1243 by Deidre Sartorius, RN Outcome: Progressing Goal: Verbalization of understanding the information provided will improve 07/05/2024 1244 by Deidre Sartorius, RN Outcome: Completed/Met 07/05/2024 1243 by Deidre Sartorius, RN Outcome: Progressing   Problem: Activity: Goal: Interest or engagement in activities will improve Outcome: Completed/Met Goal: Sleeping patterns will improve Outcome: Completed/Met   Problem: Coping: Goal: Ability to verbalize frustrations and anger appropriately will improve Outcome: Completed/Met Goal: Ability to demonstrate self-control will improve Outcome: Completed/Met

## 2024-07-05 NOTE — Discharge Instructions (Addendum)
 Activity: as tolerated  Diet: heart healthy  Other: -Follow-up with your outpatient psychiatric provider -instructions on appointment date, time, and address (location) are provided to you in discharge paperwork.  -Take your psychiatric medications as prescribed at discharge - instructions are provided to you in the discharge paperwork  -Recommend abstinence from alcohol, tobacco, and other illicit drug use at discharge.   -If your psychiatric symptoms recur, worsen, or if you have side effects to your psychiatric medications, call your outpatient psychiatric provider, 911, [phone] or go to the nearest emergency department.  -If suicidal thoughts recur, call your outpatient psychiatric provider, 911, [phone] or go to the nearest emergency department.

## 2024-07-05 NOTE — Discharge Summary (Addendum)
 Physician Discharge Summary Note  Patient:  Gina [name] is an 48 y.o., female MRN:  [id number] DOB:  Aug 15, 1976 Patient phone:  [phone] (home)  Patient address:   [address] [place] [facility] [place] [address],  Total Time spent with patient: 45 minutes  Date of Admission:  07/04/2024 Date of Discharge: 07/05/2024  Reason for Admission:  Medication management in setting of worsening depression  Principal Problem: Bipolar 1 disorder, depressed, severe (HCC) Discharge Diagnoses: Principal Problem:   Bipolar 1 disorder, depressed, severe (HCC)   Past Psychiatric History:   Current psychiatrist: Dr. Jess at [facility] Current therapist: Unknown Previous psychiatric diagnoses: Bipolar 1 disorder Current psychiatric medications: Latuda  40 mg, Wellbutrin  SR Psychiatric medication history/compliance: Lexapro  20 mg, reports compliance with her medications Psychiatric hospitalization(s): Last hospitalization 2007, participated in intensive outpatient care 2019 Psychotherapy history: Unknown Neuromodulation history: Unknown History of suicide (obtained from HPI): On chart review, it appears that patient had a 2-day inpatient stay in 2007 and endorsed suicidal ideations but did not attempt to kill herself. History of homicide or aggression (obtained in HPI): Denies  Past Medical History:  Past Medical History:  Diagnosis Date   ABDOMINAL WALL HERNIA 02/27/2010   Abnormal Pap smear    ALLERGIC RHINITIS 06/02/2007   ANOREXIA, CHRONIC 08/07/2008   ANXIETY 09/15/2010   Arthritis    BACK PAIN, THORACIC REGION 07/07/2007   Bipolar disorder (HCC)    CERVICAL RADICULOPATHY 12/11/2008   Complication of anesthesia    Pateint reprots a high tolerance   Complication of anesthesia    pt states she has not had her voice completely back since surgery in Nov. 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    Eating disorder    ENDOMETRIOSIS 12/16/2009   FATIGUE 06/11/2010   Fibromyalgia    GERD 10/23/2009   Heart palpitations    HERPES SIMPLEX INFECTION 06/11/2010   LENTIGO 04/23/2009   LUNG NODULE 06/17/2010   Narcotic abuse, continuous (HCC)    pt denies   Ovarian cyst    Palpitations 10/23/2009   Panic disorder    Rheumatoid arthritis(714.0)    Stricture and stenosis of esophagus 05/13/2010   TOBACCO ABUSE 08/07/2008   Urinary tract infection    UTI (urinary tract infection)     Past Surgical History:  Procedure Laterality Date   ABDOMINAL SURGERY     ABDOMINAL WALL MESH  REMOVAL     ABLATION ON ENDOMETRIOSIS     CESAREAN SECTION     x2    CYSTOSCOPY N/A 11/22/2023   Procedure: CYSTOSCOPY;  Surgeon: Elisabeth Valli BIRCH, MD;  Location: [facility];  Service: Urology;  Laterality: N/A;   DILATION AND CURETTAGE OF UTERUS     x1    ENDOMETRIAL ABLATION     esophageal dilatation x 4     GUM SURGERY     HERNIA REPAIR     X3   PARTIAL HYSTERECTOMY     POSTERIOR LUMBAR FUSION 2 WITH HARDWARE REMOVAL Left 03/29/2024   Procedure: ARTHROSCOPY, SHOULDER WITH DEBRIDEMENT;  Surgeon: Addie Cordella Hamilton, MD;  Location: [facility] OR;  Service: Orthopedics;  Laterality: Left;  LEFT SHOULDER ARTHROSCOPY, DEBRIDEMENT, DISTAL CLAVICLE EXCISION   RESECTION DISTAL CLAVICAL Left 03/29/2024   Procedure: EXCISION, CLAVICLE, DISTAL, OPEN;  Surgeon: Addie Cordella Hamilton, MD;  Location: [facility] OR;  Service: Orthopedics;  Laterality: Left;   SHOULDER SURGERY Right 09/2023   Family History:  Family History  Problem  Relation Age of Onset   Depression Mother    Arthritis Mother    Hyperlipidemia Father    Hypertension Father    Family Psychiatric History: Psychiatric diagnoses: Bipolar disorder Suicide history: Daughter attempted suicide per chart review Violence/aggression: Unknown Substance use history: Unknown  Social History:  Social History   Substance and Sexual Activity  Alcohol Use No     Social  History   Substance and Sexual Activity  Drug Use No    Social History   Socioeconomic History   Marital status: Married    Spouse name: Not on file   Number of children: 2   Years of education: Not on file   Highest education level: Master's degree (e.g., MA, MS, MEng, MEd, MSW, MBA)  Occupational History   Not on file  Tobacco Use   Smoking status: Every Day    Current packs/day: 0.20    Types: Cigarettes   Smokeless tobacco: Never   Tobacco comments:    2-3 cigarettes per day as of May 2025  Vaping Use   Vaping status: Every Day  Substance and Sexual Activity   Alcohol use: No   Drug use: No   Sexual activity: Yes    Birth control/protection: Surgical  Other Topics Concern   Not on file  Social History Narrative   Not on file   Social Drivers of Health   Financial Resource Strain: Low Risk  (04/30/2024)   Overall Financial Resource Strain (CARDIA)    Difficulty of Paying Living Expenses: Not hard at all  Food Insecurity: No Food Insecurity (07/04/2024)   Hunger Vital Sign    Worried About Running Out of Food in the Last Year: Never true    Ran Out of Food in the Last Year: Never true  Transportation Needs: No Transportation Needs (07/04/2024)   PRAPARE - Administrator, Civil Service (Medical): No    Lack of Transportation (Non-Medical): No  Physical Activity: Inactive (04/30/2024)   Exercise Vital Sign    Days of Exercise per Week: 0 days    Minutes of Exercise per Session: 0 min  Stress: No Stress Concern Present (04/30/2024)   [employer] of Occupational Health - Occupational Stress Questionnaire    Feeling of Stress: Only a little  Social Connections: Moderately Integrated (04/30/2024)   Social Connection and Isolation Panel    Frequency of Communication with Friends and Family: Three times a week    Frequency of Social Gatherings with Friends and Family: Twice a week    Attends Religious Services: More than 4 times per year    Active Member  of [employer] or Organizations: No    Attends Banker Meetings: Never    Marital Status: Married    Hospital Course:    Patient presented in PM of 9/3. Denied suicidal ideation, plan and intent throughout. Denied auditory and visual hallucinations. Patient denied signing the voluntary consent form because it stated she would be placed on a 72 hour hold. When asked on admission when they are safe and ready for discharge I'm ready now. I didn't think I needed to be here anyway. It was determined that patient had reached maximum therapeutic benefit from inpatient stay. Patient was then, after safety planning with husband, discharged with IOP follow-up.   Physical Findings: AIMS:  , ,  ,  ,  ,  ,   CIWA:  CIWA-Ar Total: 1 COWS:     Musculoskeletal: Strength & Muscle Tone: within normal limits Gait &  Station: normal Patient leans: N/A   Psychiatric Specialty Exam:  Presentation  General Appearance:  Casual  Eye Contact: Fair  Speech: Clear and Coherent  Speech Volume: Normal  Handedness: Right   Mood and Affect  Mood: Anxious; Irritable  Affect: Congruent   Thought Process  Thought Processes: Coherent  Descriptions of Associations:Intact  Orientation:Full (Time, Place and Person)  Thought Content:Logical  History of Schizophrenia/Schizoaffective disorder:No  Duration of Psychotic Symptoms:No data recorded Hallucinations:Hallucinations: None  Ideas of Reference:None  Suicidal Thoughts:Suicidal Thoughts: No  Homicidal Thoughts:Homicidal Thoughts: No   Sensorium  Memory: Immediate Fair  Judgment: Fair  Insight: Poor   Executive Functions  Concentration: Fair  Attention Span: Fair  Recall: Fair  Fund of Knowledge: Fair  Language: Fair   Psychomotor Activity  Psychomotor Activity:No data recorded  Assets  Assets: Desire for Improvement   Sleep  Sleep: Sleep: Fair  Estimated Sleeping Duration (Last 24 Hours):  7.25-8.50 hours   Physical Exam: Physical Exam ROS Blood pressure 108/63, pulse 62, temperature 98.1 F (36.7 C), temperature source Oral, resp. rate 20, height 5' 7 (1.702 m), weight 106.2 kg, last menstrual period 01/01/2004, SpO2 98%. Body mass index is 36.67 kg/m.   Social History   Tobacco Use  Smoking Status Every Day   Current packs/day: 0.20   Types: Cigarettes  Smokeless Tobacco Never  Tobacco Comments   2-3 cigarettes per day as of May 2025   Tobacco Cessation:  A prescription for an FDA-approved tobacco cessation medication was offered at discharge and the patient refused   Blood Alcohol level:  Lab Results  Component Value Date   [facility] <15 07/04/2024    Metabolic Disorder Labs:  No results found for: HGBA1C, MPG No results found for: PROLACTIN Lab Results  Component Value Date   CHOL 131 02/02/2007   TRIG 59 02/02/2007   HDL 38.9 (L) 02/02/2007   CHOLHDL 3.4 CALC 02/02/2007   VLDL 12 02/02/2007   LDLCALC 80 02/02/2007    See Psychiatric Specialty Exam and Suicide Risk Assessment completed by Attending Physician prior to discharge.  Discharge destination:  Home  Is patient on multiple antipsychotic therapies at discharge:  No    Allergies as of 07/05/2024       Reactions   Lamictal [lamotrigine] Anaphylaxis, Other (See Comments)   STEVEN JOHNSON'S SYNDROME   Maxalt  [rizatriptan ] Anaphylaxis   Morphine  And Codeine  Other (See Comments)   Makes pt very mean I get mean Makes pt very mean Makes pt very mean   Nicotine  Swelling, Palpitations   Other reaction(s): Respiratory Distress (ALLERGY/intolerance), Swelling (ALLERGY/intolerance), Tachycardia / Palpitations  (intolerance) Patches, lozenges Patches caused palpations lozenges throat swelled   Zofran  Anaphylaxis   Chantix  [varenicline  Tartrate] Other (See Comments)    Diaphoresis, sweating.  Night terrors.  went crazy   Ciprofloxacin  Diarrhea, Nausea And Vomiting   Codeine   Nausea And Vomiting   Haldol  [haloperidol ] Itching, Rash, Other (See Comments)   FLUSHING   Ibuprofen Nausea And Vomiting   Lidoderm  [lidocaine ] Other (See Comments)   DOESN'T WORK FOR PATIENT   Skelaxin [metaxalone] Other (See Comments)   Pt does not remember reaction, REAL BAD REACTION   Sulfa Antibiotics Diarrhea, Other (See Comments), Nausea And Vomiting   GI PAINS ALSO GI PAINS ALSO   Sulfasalazine Diarrhea   GI PAINS   Toradol  [ketorolac  Tromethamine ] Nausea And Vomiting   Ambien [zolpidem] Other (See Comments)   Mental status changes   Doxycycline     Other Reaction(s): GI Intolerance  Duloxetine Other (See Comments)   Unknown reaction   Ingrezza [valbenazine Tosylate] Hives   Lyrica [pregabalin] Other (See Comments)   Unknown reaction type   Methadone    seizures   Neurontin  [gabapentin ] Nausea And Vomiting   Other    Pt states her body does not react well to the new type of prep must use betadine   Prednisone  Other (See Comments)   Interaction to other medications   Amoxicillin Rash   Has patient had a PCN reaction causing immediate rash, facial/tongue/throat swelling, SOB or lightheadedness with hypotension: No Has patient had a PCN reaction causing severe rash involving mucus membranes or skin necrosis: No Has patient had a PCN reaction that required hospitalization: No Has patient had a PCN reaction occurring within the last 10 years: No If all of the above answers are NO, then may proceed with Cephalosporin use.   Aripiprazole Anxiety   hallucinations   Chlorhexidine  Itching, Rash   Penicillins Rash   Other reaction(s): Urticaria / Hives (ALLERGY) Has patient had a PCN reaction causing immediate rash, facial/tongue/throat swelling, SOB or lightheadedness with hypotension: No Has patient had a PCN reaction causing severe rash involving mucus membranes or skin necrosis: No Has patient had a PCN reaction that required hospitalization: No Has patient had a PCN  reaction occurring within the last 10 years: No If all of the above answers are NO, then may proceed with Cephalosporin use.   Tramadol  Rash        Medication List     TAKE these medications      Indication  calcium carbonate 500 MG chewable tablet Commonly known as: TUMS - dosed in mg elemental calcium Chew 1 tablet by mouth 4 (four) times daily as needed for indigestion or heartburn.  Indication: Gastroesophageal Reflux Disease   HYDROcodone -acetaminophen  5-325 MG tablet Commonly known as: NORCO/VICODIN Take 1 tablet by mouth every 12 (twelve) hours as needed for moderate pain (pain score 4-6).  Indication: Pain   hyoscyamine  0.125 MG SL tablet Commonly known as: LEVSIN  SL Place 0.125 mg under the tongue every 4 (four) hours as needed for cramping.  Indication: cramping   LORazepam  1 MG tablet Commonly known as: ATIVAN  Take 1 mg by mouth every 8 (eight) hours as needed for anxiety.  Indication: Feeling Anxious   lurasidone  40 MG Tabs tablet Commonly known as: LATUDA  Take 40 mg by mouth every evening.  Indication: MIXED BIPOLAR AFFECTIVE DISORDER   methocarbamol  500 MG tablet Commonly known as: ROBAXIN  TAKE 1 TABLET BY MOUTH EVERY 8 HOURS AS NEEDED FOR UP TO 7 DAYS What changed: See the new instructions.  Indication: Musculoskeletal Pain   oyster calcium 500 MG Tabs tablet Take 500 mg of elemental calcium by mouth daily.  Indication: Low Amount of Calcium in the Blood   PRESCRIPTION MEDICATION Apply 1-2 Applications topically at bedtime. Testosterone 4% cream  Indication: Apply topically   promethazine  25 MG tablet Commonly known as: PHENERGAN  Take 25 mg by mouth every 6 (six) hours as needed for nausea or vomiting.  Indication: Nausea and Vomiting   VITAMIN D3 PO Take 2 each by mouth daily. Gummy  Indication: Vitamin D  supplementation        Follow-up Information     [name], MD Follow up on 07/12/2024.   Specialty: Internal  Medicine Why: You have an appointment on 07/12/24 at 10:00 am. Contact information: [address] [place] [address] [phone]         BEHAVIORAL  HEALTH PARTIAL HOSPITALIZATION PROGRAM Follow up on 07/09/2024.   Specialty: Behavioral Health Why: You are scheduled for an assessment for the Partial Hospitalization Program on Monday, 9/8 at 10:00 am. PHP is group therapy that meets Mon-Thur from 9am-2pm and Friday from 9am-1pm and lasts for approximately 2-3 weeks. This assessment appointment will last approximately 1-1.5 hours and will be virtual via MyChart. If you need to cancel or reschedule, please call [phone].  *Please make sure she knows the program is in-person while the assessment for it Monday will be virtual Contact information: [address] [place]  [address] [phone]                Follow-up recommendations/Comments:    Activity: as tolerated  Diet: heart healthy  Other: -Follow-up with your outpatient psychiatric provider -instructions on appointment date, time, and address (location) are provided to you in discharge paperwork.  -Take your psychiatric medications as prescribed at discharge - instructions are provided to you in the discharge paperwork  -Recommend abstinence from alcohol, tobacco, and other illicit drug use at discharge.   -If your psychiatric symptoms recur, worsen, or if you have side effects to your psychiatric medications, call your outpatient psychiatric provider, 911, [phone] or go to the nearest emergency department.  -If suicidal thoughts recur, call your outpatient psychiatric provider, 911, [phone] or go to the nearest emergency department.   Signed: Demeshia Sherburne, MD 07/05/2024, 3:57 PM

## 2024-07-05 NOTE — Group Note (Signed)
 LCSW Group Therapy Note   Group Date: 07/05/2024 Start Time: 1100 End Time: 1200   Participation:  did not attend  Type of Therapy:  Group Therapy  Topic:  Healing Hearts:  A Safe Space for Grief  Objective:  The objective of this class, Healing Hearts: A Safe Space for Grief, is to create a compassionate environment where participants can process their grief, explore different stages of grief, and discover ways to honor their loved ones through personal rituals.  3 Goals: Provide a safe and supportive space where participants feel comfortable sharing their feelings and experiences of grief without judgment. Educate participants about the stages of grief and emphasize that there is no right way to grieve or a fixed timeline for healing. Introduce the concept of rituals as a means to process grief, allowing individuals to honor their loved ones in a personal and meaningful way.  Summary:  In Healing Hearts: A Safe Space for Grief, we explored the unique and personal journey of grief, emphasizing that everyone experiences it differently. We discussed the five stages of grief (denial, anger, bargaining, depression, and acceptance), with the understanding that grief is not linear. Rituals were introduced as a way to help cope with loss, offering comfort and connection through meaningful actions such as lighting candles or taking memory walks. Participants were encouraged to express their emotions, focus on self-care, and reflect on moments of gratitude for their loved ones, recognizing that healing is a process and there is no timeline for grief.  Therapeutic Modalities: Psychoeducation:  5 Stages of Grief Elements of DBT:  mindfulness and grounding for distress tolerance   Jasimine Simms O Jodell Weitman, LCSWA 07/05/2024  1:00 PM

## 2024-07-05 NOTE — Plan of Care (Signed)
  Problem: Education: Goal: Knowledge of [facility] Education information/materials will improve Outcome: Completed/Met Goal: Mental status will improve Outcome: Progressing Goal: Verbalization of understanding the information provided will improve Outcome: Progressing

## 2024-07-05 NOTE — Plan of Care (Signed)
 Collateral call: briefly spoke with Dr. Jess, patient's outpatient psychiatrist and informed him of her course today. All questions answered. Will follow up with her early next week.
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
## 2024-07-05 NOTE — [facility] Group Notes (Signed)
 Adult Psychoeducational Group Note  Date:  07/05/2024 Time:  9:24 AM  Group Topic/Focus:  Goals Group:   The focus of this group is to help patients establish daily goals to achieve during treatment and discuss how the patient can incorporate goal setting into their daily lives to aide in recovery.  Orientation:   The focus of this group is to educate the patient on the purpose and policies of crisis stabilization and provide a format to answer questions about their admission.  The group details unit policies and expectations of patients while admitted.  Participation Level:  Did Not Attend  Additional Comments:  Pt was invited but did not atttend orientation/goals group.  Niel CHRISTELLA Nightingale 07/05/2024, 9:24 AM

## 2024-07-05 NOTE — Clinical Note (Incomplete)
 Had some complications with adjusting her medicicine. Takes Latuda  40 mg and Lexapro  20 mg. Was starting to have TD really bad. So her psychiatrist took her off the lexapro  and start Wellbutrin  as her new antidepressant. Wife cannot swallow pills and the XR formulation cannot be crushed. Closed all weekend labor day, by Tuesday patient crashed -- she was a little angry, a little hostile, not physical. Was crying, very emotional and upset. Got down in a hole. Not suicidal but didn't care if she died. She asked to go get help. Her psychiatrist agreed that she needed to be in a controlled environment while we figured out her medicines. She has to sleep in the recliner a lot of nights. Patient is voluntary. We know what the problem is, and we know what we need to do to fix it. Outpatient psychiatry. Wanted her in a controlled environment. Will be at home at nights. Will have to figure out a plan during the day. Does need some therapy -- some external factors, oldest daughter. No firearms at home. Has anxiety. Things will get expontentionally worse. Need to do what I can on my end. Can monitor at home with daughter. Full monitoring at home. Not balanced right now. Mood swings: up and down, more on the down.

## 2024-07-05 NOTE — Plan of Care (Signed)
   Problem: Education: Goal: Knowledge of [facility] Education information/materials will improve Outcome: Progressing Goal: Emotional status will improve Outcome: Progressing Goal: Mental status will improve Outcome: Progressing Goal: Verbalization of understanding the information provided will improve Outcome: Progressing

## 2024-07-09 ENCOUNTER — Emergency Department (HOSPITAL_BASED_OUTPATIENT_CLINIC_OR_DEPARTMENT_OTHER)

## 2024-07-09 ENCOUNTER — Other Ambulatory Visit: Payer: Self-pay

## 2024-07-09 ENCOUNTER — Emergency Department (HOSPITAL_BASED_OUTPATIENT_CLINIC_OR_DEPARTMENT_OTHER)
Admission: EM | Admit: 2024-07-09 | Discharge: 2024-07-10 | Disposition: A | Discharge status: Home / Self Care | Attending: Emergency Medicine | Admitting: Emergency Medicine

## 2024-07-09 ENCOUNTER — Ambulatory Visit (HOSPITAL_COMMUNITY): Admitting: Licensed Clinical Social Worker

## 2024-07-09 DIAGNOSIS — F17200 Nicotine dependence, unspecified, uncomplicated: Secondary | ICD-10-CM | POA: Insufficient documentation

## 2024-07-09 DIAGNOSIS — T43295A Adverse effect of other antidepressants, initial encounter: Secondary | ICD-10-CM | POA: Diagnosis not present

## 2024-07-09 DIAGNOSIS — F314 Bipolar disorder, current episode depressed, severe, without psychotic features: Secondary | ICD-10-CM

## 2024-07-09 DIAGNOSIS — T50905A Adverse effect of unspecified drugs, medicaments and biological substances, initial encounter: Secondary | ICD-10-CM

## 2024-07-09 DIAGNOSIS — R42 Dizziness and giddiness: Secondary | ICD-10-CM | POA: Diagnosis present

## 2024-07-09 LAB — BASIC METABOLIC PANEL WITH GFR
Anion gap: 14 (ref 5–15)
BUN: 11 mg/dL (ref 6–20)
CO2: 21 mmol/L — ABNORMAL LOW (ref 22–32)
Calcium: 9.8 mg/dL (ref 8.9–10.3)
Chloride: 102 mmol/L (ref 98–111)
Creatinine, Ser: 0.71 mg/dL (ref 0.44–1.00)
GFR, Estimated: >60 mL/min (ref >60)
Glucose, Bld: 91 mg/dL (ref 70–99)
Potassium: 4 mmol/L (ref 3.5–5.1)
Sodium: 136 mmol/L (ref 135–145)

## 2024-07-09 LAB — CBC
HCT: 41.3 % (ref 36.0–46.0)
Hemoglobin: 14.4 g/dL (ref 12.0–15.0)
MCH: 31.6 pg (ref 26.0–34.0)
MCHC: 34.9 g/dL (ref 30.0–36.0)
MCV: 90.8 fL (ref 80.0–100.0)
Platelets: 291 K/uL (ref 150–400)
RBC: 4.55 MIL/uL (ref 3.87–5.11)
RDW: 11.9 % (ref 11.5–15.5)
WBC: 9.5 K/uL (ref 4.0–10.5)
nRBC: 0 % (ref 0.0–0.2)

## 2024-07-09 MED ORDER — DIAZEPAM 5 MG/ML IJ SOLN
5.0000 mg | Freq: Once | INTRAMUSCULAR | Status: AC
  Administered 2024-07-09: 5 mg via INTRAVENOUS
  Filled 2024-07-09: qty 2

## 2024-07-09 MED ORDER — LACTATED RINGERS IV BOLUS
1000.0000 mL | Freq: Once | INTRAVENOUS | Status: AC
Start: 2024-07-09 — End: 2024-07-10
  Administered 2024-07-09: 1000 mL via INTRAVENOUS

## 2024-07-09 MED ORDER — SODIUM CHLORIDE 0.9 % IV SOLN
12.5000 mg | Freq: Four times a day (QID) | INTRAVENOUS | Status: DC | PRN
  Administered 2024-07-09: 12.5 mg via INTRAVENOUS
  Filled 2024-07-09: qty 0.5

## 2024-07-09 MED ORDER — IOHEXOL 350 MG/ML SOLN
75.0000 mL | Freq: Once | INTRAVENOUS | Status: AC | PRN
  Administered 2024-07-09: 75 mL via INTRAVENOUS

## 2024-07-09 MED ORDER — PROMETHAZINE HCL 25 MG/ML IJ SOLN
INTRAMUSCULAR | Status: AC
  Filled 2024-07-09: qty 1

## 2024-07-09 NOTE — ED Notes (Signed)
 Extra gold, dark green, and blow top sent to lab to hold.

## 2024-07-09 NOTE — ED Notes (Addendum)
 Pt was attempting to ambulate to restroom with husband and had to stop and sit on stool r/t dizziness.  Took pt to restroom in [facility]  Pt need 2 person assist.   Urine specimen obtained if needed.   Pt states she has not eaten all day - cola, graham crackers and peanut butter given to pt

## 2024-07-09 NOTE — ED Triage Notes (Addendum)
 Pt POV , my head feels like its on a roller coaster ride. Recently started on Wellbutrin , concerned for adverse reaction, called poison control, advised to come to ED for evaluation. Denies SI/HI. NAD noted at this time

## 2024-07-09 NOTE — ED Notes (Signed)
 This RN spoke with poison control, requested EKG and last vitals, WNL, case to be closed

## 2024-07-09 NOTE — ED Provider Notes (Signed)
 [place] EMERGENCY DEPARTMENT AT [facility] Provider Note   CSN: [id number] Arrival date & time: 07/09/24  1505     Patient presents with: No chief complaint on file.   Gina [name] is a 48 y.o. female.  {Add pertinent medical, surgical, social history, OB history to HPI:32947} HPI     Bipolar last week, started on  Feel jittery, head feels like it is on a rollercoaster ride like it feels like it keeps stopping, jolting, and going again. Blood pressures up and down.    Started taking wellbutrin  a few days ago 200mg  Weaned off of lexopro, then started on this  Saturday, Sunday, was describing the vertigo, and today has been worse Feeling like rollercoaster feeling, lightheadedness, not room spinning but movement type of feeling   Wellbutrin  was started 9/3, symptoms started 9/6 Psychiatrist sent in the 200mg  wellbutrin  started taking the increased dose on Friday 9/5 100mg  wellbutrin  today but took 200mg  satu, sun, friday  Headache since Saturday. Not severe but there Nausea and vomiting started today No numbness/weakness No difficulty talking, no facial droop Just gen weakness, lightheadedness, dizziness and prob walking  Fuzzy/blurry when feeling lightheaded No fever, cough or urinary symptoms No thoughts of hurting self Has had dizziness like this from medications before Diarrhea with hx of IBS, 5 times today and yesterday  No black or bloody stools    Past Medical History:  Diagnosis Date   ABDOMINAL WALL HERNIA 02/27/2010   Abnormal Pap smear    ALLERGIC RHINITIS 06/02/2007   ANOREXIA, CHRONIC 08/07/2008   ANXIETY 09/15/2010   Arthritis    BACK PAIN, THORACIC REGION 07/07/2007   Bipolar disorder (HCC)    CERVICAL RADICULOPATHY 12/11/2008   Complication of anesthesia    Pateint reprots a high tolerance   Complication of anesthesia    pt states she has not had her voice completely back since surgery in Nov. 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 Authorizing Provider  calcium carbonate (TUMS - DOSED IN MG ELEMENTAL CALCIUM) 500 MG chewable tablet Chew 1 tablet by mouth 4 (four) times daily as needed for indigestion or heartburn.    [provider]  Cholecalciferol (VITAMIN D3 PO) Take 2 each by mouth daily. Gummy    [provider]  HYDROcodone -acetaminophen  (NORCO/VICODIN) 5-325 MG tablet Take 1 tablet by mouth every 12 (twelve) hours as needed for moderate pain (pain score 4-6). 06/25/24   Magnant, Carlin CROME, PA-C  hyoscyamine  (LEVSIN  SL) 0.125 MG SL tablet Place 0.125 mg under the tongue every 4 (four) hours as needed for cramping.    [provider]  LORazepam  (ATIVAN ) 1 MG tablet Take 1 mg by mouth every 8 (eight) hours as needed for anxiety.  [provider]  lurasidone  (LATUDA ) 40 MG TABS tablet Take 40 mg by mouth every evening. 12/16/22   [provider]  methocarbamol  (ROBAXIN ) 500 MG tablet TAKE 1 TABLET BY MOUTH EVERY 8 HOURS AS NEEDED FOR UP TO 7 DAYS Patient taking differently: Take 500 mg by mouth every 8 (eight) hours as needed for muscle spasms. 05/17/24   [name], NP  Allie Gutta (OYSTER CALCIUM) 500 MG TABS tablet Take 500 mg of elemental calcium by mouth daily.    [provider]   PRESCRIPTION MEDICATION Apply 1-2 Applications topically at bedtime. Testosterone 4% cream 06/21/24   [provider]  promethazine  (PHENERGAN ) 25 MG tablet Take 25 mg by mouth every 6 (six) hours as needed for nausea or vomiting.    [provider]    Allergies: Lamictal [lamotrigine], Maxalt  [rizatriptan ], Morphine  and codeine , Nicotine , Zofran , Chantix  [varenicline  tartrate], Ciprofloxacin , Codeine , Haldol  [haloperidol ], Ibuprofen, Lidoderm  [lidocaine ], Skelaxin [metaxalone], Sulfa antibiotics, Sulfasalazine, Toradol  [ketorolac  tromethamine ], Ambien [zolpidem], Doxycycline , Duloxetine, Ingrezza [valbenazine tosylate], Lyrica [pregabalin], Methadone, Neurontin  [gabapentin ], Other, Prednisone , Amoxicillin, Aripiprazole, Chlorhexidine , Penicillins, and Tramadol     Review of Systems  Updated Vital Signs BP 103/73   Pulse 80   Temp 98.4 F (36.9 C) (Oral)   Resp 18   LMP 01/01/2004   SpO2 99%   Physical Exam  (all labs ordered are listed, but only abnormal results are displayed) Labs Reviewed  BASIC METABOLIC PANEL WITH GFR - Abnormal; Notable for the following components:      Result Value   CO2 21 (*)    All other components within normal limits  CBC    EKG: EKG Interpretation Date/Time:  Monday July 09 2024 16:05:23 EDT Ventricular Rate:  79 PR Interval:  134 QRS Duration:  68 QT Interval:  350 QTC Calculation: 401 R Axis:   66  Text Interpretation: Normal sinus rhythm with sinus arrhythmia Normal ECG No significant change since last tracing Confirmed by Dreama Longs ([id number]) on 07/09/2024 6:01:35 PM  Radiology: No results found.  {Document cardiac monitor, telemetry assessment procedure when appropriate:32947} Procedures   Medications Ordered in the ED - No data to display    {Click here for ABCD2, HEART and other calculators REFRESH Note before signing:1}                              Medical Decision Making Amount and/or Complexity of  Data Reviewed Labs: ordered.   ***  {Document critical care time when appropriate  Document review of labs and clinical decision tools ie CHADS2VASC2, etc  Document your independent review of radiology images and any outside records  Document your discussion with family members, caretakers and with consultants  Document social determinants of health affecting pt's care  Document your decision making why or why not admission, treatments were needed:32947:::1}   Final diagnoses:  None    ED Discharge Orders     None

## 2024-07-10 NOTE — ED Provider Notes (Signed)
  Physical Exam  BP 112/75   Pulse 72   Temp 98.2 F (36.8 C) (Oral)   Resp 16   LMP 01/01/2004   SpO2 98%   Physical Exam Vitals and nursing note reviewed.  Constitutional:      Appearance: Normal appearance.  Pulmonary:     Effort: Pulmonary effort is normal.  Neurological:     Mental Status: She is alert and oriented to person, place, and time.     Procedures  Procedures  ED Course / MDM    Medical Decision Making Amount and/or Complexity of Data Reviewed Labs: ordered. Radiology: ordered.  Risk Prescription drug management.   Care assumed from Dr. Dreama at shift change.  Patient presenting here for possible reaction to a prescription medication change.  She has been observed for many hours and now seems to be feeling better.  She is able to ambulate to the bathroom without difficulty.  I will refer her back to her primary doctor to discuss her medications.       Geroldine Berg, MD 07/10/24 [phone]
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
## 2024-07-10 NOTE — Discharge Instructions (Signed)
 Follow-up with your primary doctor regarding your prescription medications.  Return to the ER for any new and/or concerning issues.

## 2024-07-12 ENCOUNTER — Other Ambulatory Visit (HOSPITAL_COMMUNITY): Payer: Self-pay

## 2024-07-12 ENCOUNTER — Telehealth: Payer: Self-pay

## 2024-07-12 ENCOUNTER — Encounter (HOSPITAL_COMMUNITY): Payer: Self-pay

## 2024-07-12 ENCOUNTER — Ambulatory Visit: Admitting: Internal Medicine

## 2024-07-12 ENCOUNTER — Other Ambulatory Visit: Payer: Self-pay

## 2024-07-12 ENCOUNTER — Ambulatory Visit: Payer: Self-pay

## 2024-07-12 ENCOUNTER — Emergency Department (HOSPITAL_COMMUNITY)
Admission: EM | Admit: 2024-07-12 | Discharge: 2024-07-12 | Disposition: A | Discharge status: Home / Self Care | Source: Ambulatory Visit | Attending: Emergency Medicine | Admitting: Emergency Medicine

## 2024-07-12 ENCOUNTER — Encounter: Payer: Self-pay | Admitting: Internal Medicine

## 2024-07-12 VITALS — BP 122/72 | HR 84 | Temp 98.0°F | Ht 67.0 in | Wt 232.0 lb

## 2024-07-12 DIAGNOSIS — T887XXA Unspecified adverse effect of drug or medicament, initial encounter: Secondary | ICD-10-CM | POA: Diagnosis not present

## 2024-07-12 DIAGNOSIS — R42 Dizziness and giddiness: Secondary | ICD-10-CM | POA: Insufficient documentation

## 2024-07-12 DIAGNOSIS — R208 Other disturbances of skin sensation: Secondary | ICD-10-CM

## 2024-07-12 DIAGNOSIS — T43295A Adverse effect of other antidepressants, initial encounter: Secondary | ICD-10-CM | POA: Insufficient documentation

## 2024-07-12 DIAGNOSIS — T50905A Adverse effect of unspecified drugs, medicaments and biological substances, initial encounter: Secondary | ICD-10-CM

## 2024-07-12 DIAGNOSIS — M79671 Pain in right foot: Secondary | ICD-10-CM

## 2024-07-12 DIAGNOSIS — M25511 Pain in right shoulder: Secondary | ICD-10-CM | POA: Diagnosis not present

## 2024-07-12 DIAGNOSIS — M79672 Pain in left foot: Secondary | ICD-10-CM

## 2024-07-12 DIAGNOSIS — M25512 Pain in left shoulder: Secondary | ICD-10-CM

## 2024-07-12 DIAGNOSIS — M797 Fibromyalgia: Secondary | ICD-10-CM | POA: Diagnosis not present

## 2024-07-12 DIAGNOSIS — M542 Cervicalgia: Secondary | ICD-10-CM

## 2024-07-12 DIAGNOSIS — F314 Bipolar disorder, current episode depressed, severe, without psychotic features: Secondary | ICD-10-CM

## 2024-07-12 DIAGNOSIS — R1115 Cyclical vomiting syndrome unrelated to migraine: Secondary | ICD-10-CM

## 2024-07-12 DIAGNOSIS — G8929 Other chronic pain: Secondary | ICD-10-CM

## 2024-07-12 DIAGNOSIS — R635 Abnormal weight gain: Secondary | ICD-10-CM

## 2024-07-12 DIAGNOSIS — E559 Vitamin D deficiency, unspecified: Secondary | ICD-10-CM

## 2024-07-12 LAB — CBG MONITORING, ED: Glucose-Capillary: 78 mg/dL (ref 70–99)

## 2024-07-12 LAB — URINALYSIS, ROUTINE W REFLEX MICROSCOPIC
Bilirubin Urine: NEGATIVE
Glucose, UA: NEGATIVE mg/dL
Hgb urine dipstick: NEGATIVE
Ketones, ur: NEGATIVE mg/dL
Leukocytes,Ua: NEGATIVE
Nitrite: NEGATIVE
Protein, ur: NEGATIVE mg/dL
Specific Gravity, Urine: 1.005 (ref 1.005–1.030)
pH: 7 (ref 5.0–8.0)

## 2024-07-12 LAB — CBC WITH DIFFERENTIAL/PLATELET
Abs Immature Granulocytes: 0.02 K/uL (ref 0.00–0.07)
Basophils Absolute: 0 K/uL (ref 0.0–0.1)
Basophils Relative: 1 %
Eosinophils Absolute: 0.2 K/uL (ref 0.0–0.5)
Eosinophils Relative: 2 %
HCT: 42.2 % (ref 36.0–46.0)
Hemoglobin: 13.9 g/dL (ref 12.0–15.0)
Immature Granulocytes: 0 %
Lymphocytes Relative: 34 %
Lymphs Abs: 2.8 K/uL (ref 0.7–4.0)
MCH: 31 pg (ref 26.0–34.0)
MCHC: 32.9 g/dL (ref 30.0–36.0)
MCV: 94.2 fL (ref 80.0–100.0)
Monocytes Absolute: 0.8 K/uL (ref 0.1–1.0)
Monocytes Relative: 10 %
Neutro Abs: 4.4 K/uL (ref 1.7–7.7)
Neutrophils Relative %: 53 %
Platelets: 267 K/uL (ref 150–400)
RBC: 4.48 MIL/uL (ref 3.87–5.11)
RDW: 11.9 % (ref 11.5–15.5)
WBC: 8.2 K/uL (ref 4.0–10.5)
nRBC: 0 % (ref 0.0–0.2)

## 2024-07-12 LAB — COMPREHENSIVE METABOLIC PANEL WITH GFR
ALT: 9 U/L (ref 0–44)
AST: 17 U/L (ref 15–41)
Albumin: 4 g/dL (ref 3.5–5.0)
Alkaline Phosphatase: 120 U/L (ref 38–126)
Anion gap: 10 (ref 5–15)
BUN: 9 mg/dL (ref 6–20)
CO2: 23 mmol/L (ref 22–32)
Calcium: 8.9 mg/dL (ref 8.9–10.3)
Chloride: 105 mmol/L (ref 98–111)
Creatinine, Ser: 0.66 mg/dL (ref 0.44–1.00)
GFR, Estimated: >60 mL/min (ref >60)
Glucose, Bld: 85 mg/dL (ref 70–99)
Potassium: 3.9 mmol/L (ref 3.5–5.1)
Sodium: 139 mmol/L (ref 135–145)
Total Bilirubin: <0.2 mg/dL (ref 0.0–1.2)
Total Protein: 6.2 g/dL — ABNORMAL LOW (ref 6.5–8.1)

## 2024-07-12 MED ORDER — KETOROLAC TROMETHAMINE 60 MG/2ML IM SOLN
60.0000 mg | Freq: Once | INTRAMUSCULAR | Status: AC
  Administered 2024-07-12: 60 mg via INTRAMUSCULAR

## 2024-07-12 MED ORDER — HYDROMORPHONE HCL 1 MG/ML IJ SOLN
1.0000 mg | Freq: Once | INTRAMUSCULAR | Status: AC
  Administered 2024-07-12: 1 mg via INTRAVENOUS
  Filled 2024-07-12: qty 1

## 2024-07-12 MED ORDER — DIAZEPAM 5 MG/ML IJ SOLN
5.0000 mg | Freq: Once | INTRAMUSCULAR | Status: AC
  Administered 2024-07-12: 5 mg via INTRAVENOUS
  Filled 2024-07-12: qty 2

## 2024-07-12 MED ORDER — SODIUM CHLORIDE 0.9 % IV SOLN
25.0000 mg | Freq: Once | INTRAVENOUS | Status: AC
  Administered 2024-07-12: 25 mg via INTRAVENOUS
  Filled 2024-07-12: qty 25

## 2024-07-12 MED ORDER — JOURNAVX 50 MG PO TABS: 1.0000 | ORAL_TABLET | Freq: Two times a day (BID) | ORAL | 5 refills | Status: DC | PRN

## 2024-07-12 MED ORDER — OXYCODONE-ACETAMINOPHEN 10-325 MG PO TABS: 1.0000 | ORAL_TABLET | ORAL | 0 refills | Status: DC | PRN

## 2024-07-12 MED ORDER — CYANOCOBALAMIN 1000 MCG/ML IJ SOLN
1000.0000 ug | Freq: Once | INTRAMUSCULAR | Status: AC
  Administered 2024-07-12: 1000 ug via INTRAMUSCULAR

## 2024-07-12 MED ORDER — DROPERIDOL 2.5 MG/ML IJ SOLN
1.2500 mg | Freq: Once | INTRAMUSCULAR | Status: AC
  Administered 2024-07-12: 1.25 mg via INTRAVENOUS
  Filled 2024-07-12: qty 2

## 2024-07-12 MED ORDER — SODIUM CHLORIDE 0.9 % IV BOLUS
1000.0000 mL | Freq: Once | INTRAVENOUS | Status: AC
  Administered 2024-07-12: 1000 mL via INTRAVENOUS

## 2024-07-12 MED ORDER — DIAZEPAM 5 MG PO TABS: 5.0000 mg | ORAL_TABLET | Freq: Two times a day (BID) | ORAL | 0 refills | Status: DC | PRN

## 2024-07-12 NOTE — Discharge Instructions (Addendum)
 Take the valium  instead of ativan  for the muscle spasms.

## 2024-07-12 NOTE — Telephone Encounter (Signed)
 FYI Only or Action Required?: Action required by provider: update on patient condition.; request for direct admission.  Patient was last seen in primary care on 07/12/2024 by Gina Bernardino MATSU, MD.  Called Nurse Triage reporting Pain.  Symptoms began several days ago.  Interventions attempted: Prescription medications: Percocet, muscle relaxer.  Symptoms are: whole body severe pain, chest pain, headache, jaw pain, dizziness, diarrhea unchanged.  Triage Disposition: Call PCP Now (overriding See HCP Within 4 Hours (Or PCP Triage))  Patient/caregiver understands and will follow disposition?: Yes                  Copied from CRM [id number]. Topic: Clinical - Red Word Triage >> Jul 12, 2024  1:19 PM Gina [name] wrote: [facility] that prompted transfer to Nurse Triage: Patient just took pain medicine oxyCODONE -acetaminophen  (PERCOCET) 10-325 MG tablet and states it is not helping. She is in pain all over. Patient is scared. She wants to know if pcp can put her in the hospital. She states if she just go to the emergency room they will just sends her home. She states she doesn't feel right and feelsmlike she has been hit by a bus. She was also told that her body is toxic right now (headache and burning feeling). Reason for Disposition  [1] SEVERE pain (e.g., excruciating, unable to do any normal activities) AND [2] not improved 2 hours after pain medicine  Answer Assessment - Initial Assessment Questions Patient declines going to the Emergency Dept. She is asking if Dr Gina will direct admit her to the hospital.   1. ONSET: When did the muscle aches or body pains start?      She states it [facility] the [facility] and was started on Wellbutrin . She states last Thursday she was in so much pain she couldn't move. Couple days to a week.  2. LOCATION: What part of your body is hurting? (e.g., entire body, arms, legs)      She states it feels like she just got out  of surgery all over her whole body. Headache and feels like it is going to explode. Chest hurts. Jaw feels like it is locking up. She states everything in her body feels very tight.  3. SEVERITY: How bad is the pain? (Scale 1-10; or mild, moderate, severe)     Sharp, severe. She states she took her Percocet 10mg  and muscle relaxers about 45 minutes ago and it has not touched her pain.  4. CAUSE: What do you think is causing the pains?     She states she thought it was a fibromyalgia shut down. 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  5. FEVER: Do you have a fever? If Yes, ask: What is your temperature, how was it measured, and  when did it start?      No fevers but states she felt warm to touch.  6. OTHER SYMPTOMS: Do you have any other symptoms? (e.g., chest pain, cold or flu symptoms, rash, weakness, weight loss)     Dizziness, states gradually improved. Denies SOB. Diarrhea.   7. PREGNANCY: Is there any chance you are pregnant? When was your last menstrual period?     Partial hysterectomy.  8. TRAVEL: Have you traveled out of the country in the last month? (e.g., exposures, travel history)     No.  Protocols used: Muscle Aches and Body Pain-A-AH

## 2024-07-12 NOTE — ED Provider Notes (Addendum)
 [place] [facility] AT [facility] Provider Note   CSN: [id number] Arrival date & time: 07/12/24  1543     Patient presents with: Dizziness and Headache   Gina [name] is a 48 y.o. female.   Pt is a 48 yo female with pmhx significant for bipolar d/o, fibromyalgia, endometriosis, and GERD.  Pt said sx started when she requested to be taken off Lexapro  as she thought it was causing her weight gain.  She was off medications for a few days and had to be hospitalized for SI from 9/3-9/4.  She was put on Wellbutrin  and Trazodone .  She came to the ED on 9/8 with severe dizziness.  She had a negative work up and felt better after valium  and phenergan  and was d/c home.  She has been feeling worse, so she went to her PCP for the first time today and was sent here to rule out serotonin syndrome and NMS.  Pt did receive toradol  and B12 in his office.  Rx for percocet 10s prescribed as well.  Pt has pain everywhere.  She said wherever she's been injured before feels like it's a brand new injury.  She still feels dizzy.       Prior to Admission medications   Medication Sig Start Date End Date Taking? Authorizing Provider  diazepam  (VALIUM ) 5 MG tablet Take 1 tablet (5 mg total) by mouth 2 (two) times daily as needed for muscle spasms. 07/12/24  Yes Dean Clarity, MD  calcium carbonate (TUMS - DOSED IN MG ELEMENTAL CALCIUM) 500 MG chewable tablet Chew 1 tablet by mouth 4 (four) times daily as needed for indigestion or heartburn.    [provider]  Cholecalciferol (VITAMIN D3 PO) Take 2 each by mouth daily. Gummy    [provider]  EC-RX Testosterone 0.2 % CREA Place onto the skin.    [provider]  escitalopram  (LEXAPRO ) 10 MG tablet Take 10 mg by mouth daily. 07/10/24   [provider]  hyoscyamine  (LEVSIN  SL) 0.125 MG SL tablet Place 0.125 mg under the tongue every 4 (four) hours as needed for cramping.    [provider]   LORazepam  (ATIVAN ) 1 MG tablet Take 1 mg by mouth every 8 (eight) hours as needed for anxiety.    [provider]  lurasidone  (LATUDA ) 40 MG TABS tablet Take 40 mg by mouth every evening. 12/16/22   [provider]  methocarbamol  (ROBAXIN ) 500 MG tablet TAKE 1 TABLET BY MOUTH EVERY 8 HOURS AS NEEDED FOR UP TO 7 DAYS 05/17/24   Williamson, Joanna R, NP  oxyCODONE -acetaminophen  (PERCOCET) 10-325 MG tablet Take 1 tablet by mouth every 4 (four) hours as needed for up to 5 days for pain. 07/12/24 07/17/24  [name], MD  Oyster Shell (OYSTER CALCIUM) 500 MG TABS tablet Take 500 mg of elemental calcium by mouth daily.    [provider]  PRESCRIPTION MEDICATION Apply 1-2 Applications topically at bedtime. Testosterone 4% cream 06/21/24   [provider]  promethazine  (PHENERGAN ) 25 MG tablet Take 25 mg by mouth every 6 (six) hours as needed for nausea or vomiting.    [provider]  Suzetrigine  (JOURNAVX ) 50 MG TABS Take 1 tablet by mouth 2 (two) times daily as needed. 07/12/24   [name], MD    Allergies: Lamictal [lamotrigine], Maxalt  [rizatriptan ], Morphine  and codeine , Nicotine , Zofran , Chantix  [varenicline  tartrate], Ciprofloxacin , Codeine , Haldol  [haloperidol ], Ibuprofen, Lidoderm  [lidocaine ], Skelaxin [metaxalone], Sulfa antibiotics, Sulfasalazine, Toradol  [ketorolac   tromethamine ], Ambien [zolpidem], Doxycycline , Duloxetine, Ingrezza [valbenazine tosylate], Lyrica [pregabalin], Methadone, Neurontin  [gabapentin ], Other, Prednisone , Wellbutrin  [bupropion ], Amoxicillin, Aripiprazole, Chlorhexidine , Penicillins, and Tramadol     Review of Systems  Musculoskeletal:        Diffuse pain  Neurological:  Positive for dizziness.  All other systems reviewed and are negative.   Updated Vital Signs BP 97/79   Pulse 69   Temp 97.6 F (36.4 C) (Oral)   Resp 18   LMP 01/01/2004   SpO2 98%   Physical Exam Vitals and nursing note reviewed.   Constitutional:      Appearance: She is well-developed.  HENT:     Head: Normocephalic and atraumatic.     Mouth/Throat:     Mouth: Mucous membranes are moist.     Pharynx: Oropharynx is clear.  Eyes:     Extraocular Movements: Extraocular movements intact.     Pupils: Pupils are equal, round, and reactive to light.  Cardiovascular:     Rate and Rhythm: Normal rate and regular rhythm.     Heart sounds: Normal heart sounds.  Pulmonary:     Effort: Pulmonary effort is normal.     Breath sounds: Normal breath sounds.  Abdominal:     General: Bowel sounds are normal.     Palpations: Abdomen is soft.  Musculoskeletal:        General: Normal range of motion.     Cervical back: Normal range of motion and neck supple.     Comments: Pt has diffuse pain wherever she's touched  Skin:    General: Skin is warm and dry.  Neurological:     Mental Status: She is alert and oriented to person, place, and time.  Psychiatric:        Mood and Affect: Mood normal.        Speech: Speech normal.        Behavior: Behavior normal.     (all labs ordered are listed, but only abnormal results are displayed) Labs Reviewed  COMPREHENSIVE METABOLIC PANEL WITH GFR - Abnormal; Notable for the following components:      Result Value   Total Protein 6.2 (*)    All other components within normal limits  CBC WITH DIFFERENTIAL/PLATELET  URINALYSIS, ROUTINE W REFLEX MICROSCOPIC  CBG MONITORING, ED    EKG: EKG Interpretation Date/Time:  Thursday July 12 2024 18:20:07 EDT Ventricular Rate:  66 PR Interval:  147 QRS Duration:  84 QT Interval:  399 QTC Calculation: 418 R Axis:   63  Text Interpretation: Sinus rhythm Low voltage, precordial leads No significant change since last tracing Confirmed by Dean Clarity [phone]) on 07/12/2024 9:26:17 PM  Radiology: No results found.   Procedures   Medications Ordered in the ED  sodium chloride  0.9 % bolus 1,000 mL (1,000 mLs Intravenous New  Bag/Given 07/12/24 1810)  diazepam  (VALIUM ) injection 5 mg (5 mg Intravenous Given 07/12/24 1809)  promethazine  (PHENERGAN ) 25 mg in sodium chloride  0.9 % 50 mL IVPB (0 mg Intravenous Stopped 07/12/24 1834)  HYDROmorphone  (DILAUDID ) injection 1 mg (1 mg Intravenous Given 07/12/24 1833)  droperidol  (INAPSINE ) 2.5 MG/ML injection 1.25 mg (1.25 mg Intravenous Given 07/12/24 2013)  diazepam  (VALIUM ) injection 5 mg (5 mg Intravenous Given 07/12/24 2013)                                    Medical Decision Making Amount and/or Complexity of Data Reviewed Labs: ordered.  Radiology: ordered.  Risk Prescription drug management.   This patient presents to the ED for concern of dizziness, this involves an extensive number of treatment options, and is a complaint that carries with it a high risk of complications and morbidity.  The differential diagnosis includes serotonin syn, nms, infection, cva, ms, electrolyte abn, anemia   Co morbidities that complicate the patient evaluation  bipolar d/o, fibromyalgia, endometriosis, and GERD   Additional history obtained:  Additional history obtained from epic chart review External records from outside source obtained and reviewed including friend   Lab Tests:  I Ordered, and personally interpreted labs.  The pertinent results include:  cbc nl, cmp nl, ua neg   Cardiac Monitoring:  The patient was maintained on a cardiac monitor.  I personally viewed and interpreted the cardiac monitored which showed an underlying rhythm of: nsr   Medicines ordered and prescription drug management:  I ordered medication including ivfs/valium /dilaudid /inapsine   for sx  Reevaluation of the patient after these medicines showed that the patient improved I have reviewed the patients home medicines and have made adjustments as needed   Test Considered:  mri   Critical Interventions:  Pain control   Problem List / ED Course:  Drug rxn:  likely from  wellbutrin .  Pt does not have AMS, htn, tachycardia, muscular rigidity, hyperthermia, or abn labs.  So, I don't think pt has serotonin syn or NMS.  Pt feels much better now and is ready to go home.  She knows to not take her wellbutrin  or trazodone .  She is to return if worse. F/u with pcp.   Reevaluation:  After the interventions noted above, I reevaluated the patient and found that they have :improved   Social Determinants of Health:  Lives at home   Dispostion:  After consideration of the diagnostic results and the patients response to treatment, I feel that the patent would benefit from discharge with outpatient f/u.       Final diagnoses:  Adverse effect of drug, initial encounter    ED Discharge Orders          Ordered    diazepam  (VALIUM ) 5 MG tablet  2 times daily PRN        07/12/24 2123               Dean Clarity, MD 07/12/24 2125    Dean Clarity, MD 07/12/24 2136

## 2024-07-12 NOTE — Patient Instructions (Addendum)
 It was a pleasure seeing you today! Your health and satisfaction are our top priorities.  [name], MD  VISIT SUMMARY: Today, we discussed your severe pain, recent medication changes, and other ongoing health concerns. We reviewed your history of fibromyalgia, bipolar disorder, and gastrointestinal issues. We also addressed your recent weight gain and chronic pain in your shoulders, neck, and feet. A comprehensive plan was created to manage your symptoms and improve your overall health.  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 We will discontinue Wellbutrin  immediately and have given you a B12 shot to help with nerve healing and a Toradol  shot for pain relief. You are prescribed Percocet 10 mg, 20 tablets, to be taken every 4 hours as needed for pain. We are also considering Jurnavix for pain management, pending insurance approval. We will check your vitamin D  levels to rule out toxicity and stop vitamin D  supplementation until we get the results.  -FIBROMYALGIA: Fibromyalgia is a chronic condition that causes widespread musculoskeletal pain and fatigue. Your symptoms have recently worsened, possibly due to stress and medication changes. We will continue with your current management strategies and avoid physical therapy for now as it may worsen your pain.  -PAIN IN BILATERAL SHOULDERS, NECK, AND FEET WITH HISTORY OF INJURY AND CONGENITAL ACCESSORY BONE: You have chronic pain in your shoulders, neck, and feet, which may be worsened by your hyperalgesia syndrome. Given your history of injuries and surgeries, we will consider imaging of your spine if the pain persists or worsens. We may also evaluate the potential for nerve blocks if your pain management is not adequate.  -CHRONIC  VOMITING SYNDROME: Chronic vomiting syndrome involves frequent episodes of vomiting and diarrhea. Levsin  has been helping to control your vomiting. We will continue with Levsin  and evaluate for any vitamin deficiencies that may be contributing to your symptoms, including checking your B12 levels.  -BIPOLAR DISORDER, SEVERE, WITH HISTORY OF PSYCHOSIS: Bipolar disorder is a mental health condition characterized by extreme mood swings. Your recent medication changes have affected your mental health stability. We will continue with Latuda  for managing your bipolar disorder and discontinue trazodone  to reduce the risk of serotonin syndrome. We will closely monitor your mental health, especially with any medication changes.  INSTRUCTIONS: Please follow up with us  for a vitamin D  level check and to discuss the potential use of Jurnavix for pain management. Continue taking your medications as prescribed and monitor your symptoms closely. If you experience any new or worsening symptoms, contact us  immediately. We will also keep an eye on your mental health stability and make adjustments as needed.  Your Providers PCP: [name], MD,  [phone]) Referring Provider: [name], NP,  [phone]) Care Team Provider: Jess Roby Masker, MD Care Team Provider: Shirly Carlin LITTIE DEVONNA,  [phone]) Care Team Provider: Dale Cough, MD,  [phone]) Care Team Provider: Mat Browning, MD,  [phone])  NEXT STEPS: [x]  Early Intervention: Schedule sooner appointment, call our on-call services, or go to emergency room if there is any significant Increase in pain or discomfort New or worsening symptoms Sudden or severe changes in your health [x]  Flexible Follow-Up: We recommend a No follow-ups on file. for optimal routine care. This allows for progress monitoring and treatment adjustments. [x]  Preventive Care: Schedule your annual preventive care visit! It's typically  covered by insurance  and helps identify potential health issues early. [x]  Lab & X-ray Appointments: Incomplete tests scheduled today, or call to schedule. X-rays: [facility] (M-F, 8:30am-noon or 1pm-5pm). [x]  Medical Information Release: Sign a release form at front desk to obtain relevant medical information we don't have.  MAKING THE MOST OF OUR FOCUSED 20 MINUTE APPOINTMENTS: [x]   Clearly state your top concerns at the beginning of the visit to focus our discussion [x]   If you anticipate you will need more time, please inform the front desk during scheduling - we can book multiple appointments in the same week. [x]   If you have transportation problems- use our convenient video appointments or ask about transportation support. [x]   We can get down to business faster if you use MyChart to update information before the visit and submit non-urgent questions before your visit. Thank you for taking the time to provide details through MyChart.  Let our nurse know and she can import this information into your encounter documents.  Arrival and Wait Times: [x]   Arriving on time ensures that everyone receives prompt attention. [x]   Early morning (8a) and afternoon (1p) appointments tend to have shortest wait times. [x]   Unfortunately, we cannot delay appointments for late arrivals or hold slots during phone calls.  Getting Answers and Following Up [x]   Simple Questions & Concerns: For quick questions or basic follow-up after your visit, reach us  at [phone] or MyChart messaging. [x]   Complex Concerns: If your concern is more complex, scheduling an appointment might be best. Discuss this with the staff to find the most suitable option. [x]   Lab & Imaging Results: We'll contact you directly if results are abnormal or you don't use MyChart. Most normal results will be on MyChart within 2-3 business days, with a review message from Dr. Jesus. Haven't heard back in 2 weeks? Need  results sooner? Contact us  at [phone]. [x]   Referrals: Our referral coordinator will manage specialist referrals. The specialist's office should contact you within 2 weeks to schedule an appointment. Call us  if you haven't heard from them after 2 weeks.  Staying Connected [x]   MyChart: Activate your MyChart for the fastest way to access results and message us . See the last page of this paperwork for instructions on how to activate.  Bring to Your Next Appointment [x]   Medications: Please bring all your medication bottles to your next appointment to ensure we have an accurate record of your prescriptions. [x]   Health Diaries: If you're monitoring any health conditions at home, keeping a diary of your readings can be very helpful for discussions at your next appointment.  Billing [x]   X-ray & Lab Orders: These are billed by separate companies. Contact the invoicing company directly for questions or concerns. [x]   Visit Charges: Discuss any billing inquiries with our administrative services team.  Your Satisfaction Matters [x]   Share Your Experience: We strive for your satisfaction! If you have any complaints, or preferably compliments, please let Dr. Jesus know directly or contact our Practice Administrators, Manuelita Rubin or [employer], by asking at the front desk.   Reviewing Your Records [x]   Review this early draft of your clinical encounter notes below and the final encounter summary tomorrow on MyChart after its been completed.  All orders placed so far are visible here: Allodynia -     Ketorolac  Tromethamine  -     Cyanocobalamin  -     Journavx ; Take 1 tablet by mouth 2 (two) times daily as needed.  Dispense: 60 tablet; Refill: 5 -     oxyCODONE -Acetaminophen ; Take 1 tablet by mouth every 4 (four) hours as needed for up to 5 days for pain.  Dispense: 20 tablet; Refill: 0  Weight gain  Right foot pain -     Journavx ; Take 1 tablet by mouth 2 (two) times daily as needed.   Dispense: 60 tablet; Refill: 5  Left foot pain -     Journavx ; Take 1 tablet by mouth 2 (two) times daily as needed.  Dispense: 60 tablet; Refill: 5  Fibromyalgia -     Journavx ; Take 1 tablet by mouth 2 (two) times daily as needed.  Dispense: 60 tablet; Refill: 5  Chronic pain of both shoulders -     Journavx ; Take 1 tablet by mouth 2 (two) times daily as needed.  Dispense: 60 tablet; Refill: 5  Neck pain -     Journavx ; Take 1 tablet by mouth 2 (two) times daily as needed.  Dispense: 60 tablet; Refill: 5  Cyclic vomiting syndrome -     VITAMIN D  25 Hydroxy (Vit-D Deficiency, Fractures) -     Journavx ; Take 1 tablet by mouth 2 (two) times daily as needed.  Dispense: 60 tablet; Refill: 5 -     CBC with Differential/Platelet -     Comprehensive metabolic panel with GFR -     A87 and Folate Panel  Vitamin D  deficiency -     VITAMIN D  25 Hydroxy (Vit-D Deficiency, Fractures)  Bipolar 1 disorder, depressed, severe (HCC)

## 2024-07-12 NOTE — Psych (Signed)
 Active     OP Depression     LTG: Reduce frequency, intensity, and duration of depression symptoms so that daily functioning is improved     Start:  07/12/24    Expected End:  09/10/24         LTG: Gina [name] will score less than 9 on the Patient Health Questionnaire (PHQ-9)      Start:  07/12/24    Expected End:  09/10/24         STG: Gina [name] will attend at least 80% of scheduled group psychotherapy sessions      Start:  07/12/24    Expected End:  09/10/24         STG: Gina [name] will cooperate with a psychiatric evaluation on first day and during all follow-up visits     Start:  07/12/24    Expected End:  09/10/24         STG: Gina [name] will identify cognitive patterns and beliefs that support depression     Start:  07/12/24    Expected End:  09/10/24         Encourage Gina [name] to participate in recovery peer support activities weekly      Start:  07/12/24         Work with Gina [name] to identify the major components of a recent episode of depression: physical symptoms, major thoughts and images, and major behaviors they experienced     Start:  07/12/24         Therapist will educate patient on cognitive distortions and the rationale for treatment of depression     Start:  07/12/24         Create a weekly activity schedule     Start:  07/12/24         Gina [name] will review pleasant activities list and select 2 activities to practice weekly for the next 3 weeks     Start:  07/12/24            Pt verbally consented and aligned with treatment plan upon creation.

## 2024-07-12 NOTE — Assessment & Plan Note (Signed)
 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 Action: Wellbutrin  discontinued today.(Was already stopped) Action: Trazodone  discontinued today due to concern for serotonin syndrome. Action: Given patient's report of nausea and vomiting with Toradol  in the past, this medication was held. Action: B12 1000mcg IM administered. Action: Prescribed Percocet 10/325mg  #20, 1 tab q4h prn pain. Action: Ordered CBC, CMP, Vitamin D  level, B12 and Folate Panel. Plan: Monitor for improvement following medication changes. If symptoms worsen or new symptoms develop (especially signs of serotonin syndrome or NMS), instruct patient to go to the Emergency Department immediately. Plan: Strongly consider referral to pain management specialist for evaluation of complex regional pain syndrome and/or small fiber neuropathy if symptoms persist.  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 A recent increase in Wellbutrin  dosage may have triggered this condition, and fits the timing of the problem best. Although serotonin syndrome and neuroleptic malignant syndrome are considered, Wellbutrin -induced nerve damage is most probable. The condition is reversible but may take months to recover, but generally resolves in 2 weeks.  Has already discontinued Wellbutrin  immediately. Administer a B12 shot to aid nerve healing and a Toradol  shot for pain relief. Prescribe Percocet 10 mg, 20 tablets, to be taken every 4 hours as needed for pain. Consider Journavx  for pain management, pending insurance approval. Order a vitamin D  level to rule out toxicity and stop vitamin D  supplementation until levels are checked.  Fibromyalgia   She has a chronic condition with recent exacerbation, possibly triggered by a hospital stay and medication changes.  Symptoms include widespread musculoskeletal pain and fatigue, likely related to recent stressors and medication adjustments. Continue current management strategies for fibromyalgia and avoid physical therapy temporarily as it may exacerbate pain.  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 Pain may be exacerbated by hyperalgesia syndrome. Recent surgeries on shoulders and neck injury are noted. Consider imaging of the spine if pain persists or worsens. Evaluate for potential nerve blocks if pain management is inadequate.  She declined suboxone or pain mgmt referrals; history of bad experience pain management.   Advised patient we will explore the weight gain issue once we stabilize her extreme pain.

## 2024-07-12 NOTE — Assessment & Plan Note (Signed)
 Bipolar disorder, severe, with history of psychosis   Severe bipolar disorder with a history of psychosis is present. Recent medication changes have destabilized her mental health. Current medications include Latuda  and trazodone . There is a risk of manic episodes if Latuda  is discontinued. Continue Latuda  for bipolar disorder management and discontinue trazodone  to reduce the risk of serotonin syndrome. Monitor mental health closely, especially if medication changes are made.

## 2024-07-12 NOTE — Telephone Encounter (Signed)
 Spoke with pt via phone advised pt to go to the ED provider has note done from todays visit and ED dr should see all of his detailed notes from today visit. Provider is not able to just admit her to the hospital pt is aware of this. Pt understood well and was advised to go to [facility] or [facility] for treatment for all of her symptoms she is having.

## 2024-07-12 NOTE — ED Triage Notes (Signed)
 Pt presents to ED from home, sent by psychiatrist for concern of serotonin syndrome d/t coming off wellbutrin .

## 2024-07-12 NOTE — Progress Notes (Signed)
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 arterial injury. No significant stenosis of the brachiocephalic or subclavian arteries. CERVICAL CAROTID ARTERIES: No dissection, arterial injury, or hemodynamically significant stenosis by NASCET criteria. CERVICAL VERTEBRAL ARTERIES: No dissection, arterial injury, or significant stenosis. LUNGS AND MEDIASTINUM: Unremarkable. SOFT TISSUES: No acute abnormality. BONES: No acute abnormality. CTA HEAD: ANTERIOR CIRCULATION: No significant stenosis of the internal carotid arteries. No significant stenosis of the anterior cerebral arteries. No significant stenosis of the middle cerebral arteries. No aneurysm. POSTERIOR CIRCULATION: No significant stenosis of the posterior cerebral arteries. No significant stenosis of the basilar artery. No significant stenosis of the vertebral arteries. No aneurysm. OTHER: No acute intracranial hemorrhage. Grey-white matter differentiation is maintained. No edema, mass effect, or midline shift.  The basilar cisterns are patent. Ventricles are unremarkable. Orbits are symmetric. Calvarium is intact. Mastoid air cells are clear. Paranasal sinuses are clear. No dural venous sinus thrombosis on this non-dedicated study. IMPRESSION: 1. No acute intracranial findings. 2. No large vessel occlusion, hemodynamically significant stenosis, or aneurysm in the head or neck. Electronically signed by: Donnice Mania MD 07/09/2024 10:25 PM EDT RP Workstation: [id number]   DG Foot Complete Left Result Date: 06/23/2024 CLINICAL DATA:  Left foot pain EXAM: LEFT FOOT - COMPLETE 3+ VIEW COMPARISON:  01/23/2024 FINDINGS: There is no evidence of fracture or dislocation. There is no evidence of arthropathy or other focal bone abnormality. Soft tissues are unremarkable. IMPRESSION: No acute abnormality noted. Electronically Signed   By: Oneil Devonshire M.D.   On: 06/23/2024 02:01   VAS US  LOWER EXTREMITY VENOUS (DVT) Result Date: 06/14/2024  Lower Venous DVT Study Patient Name:  Zhanae Proffit  Date of Exam:   06/14/2024 Medical Rec #: [id number]            Accession #:    [id number] Date of Birth: [birth year]            Patient Gender: F Patient Age:   36 years Exam Location:  [address] Procedure:      VAS US  LOWER EXTREMITY VENOUS (DVT) Referring Phys: CARLIN CALIX --------------------------------------------------------------------------------  Indications: Pain, and Edema.left lower extremity  Performing Technologist: Devere Dark RVT  Examination Guidelines: A complete evaluation includes B-mode imaging, spectral Doppler, color Doppler, and power Doppler as needed of all accessible portions of each vessel. Bilateral testing is considered an integral part of a complete examination. Limited examinations for reoccurring indications may be performed as noted. The reflux portion of the exam is performed with the patient in reverse Trendelenburg.  +-----+---------------+---------+-----------+----------+--------------+  RIGHTCompressibilityPhasicitySpontaneityPropertiesThrombus Aging +-----+---------------+---------+-----------+----------+--------------+ CFV  Full           Yes      Yes                                 +-----+---------------+---------+-----------+----------+--------------+   +---------+---------------+---------+-----------+----------+--------------+ LEFT     CompressibilityPhasicitySpontaneityPropertiesThrombus Aging +---------+---------------+---------+-----------+----------+--------------+ CFV      Full           Yes      Yes                                 +---------+---------------+---------+-----------+----------+--------------+ SFJ      Full           Yes      Yes                                 +---------+---------------+---------+-----------+----------+--------------+  FV Prox  Full           Yes      Yes                                 +---------+---------------+---------+-----------+----------+--------------+ FV Mid   Full           Yes      Yes                                 +---------+---------------+---------+-----------+----------+--------------+ FV DistalFull           Yes      Yes                                 +---------+---------------+---------+-----------+----------+--------------+ PFV      Full           Yes      Yes                                 +---------+---------------+---------+-----------+----------+--------------+ POP      Full           Yes      Yes                                 +---------+---------------+---------+-----------+----------+--------------+ PTV      Full           Yes      Yes                                 +---------+---------------+---------+-----------+----------+--------------+ PERO     Full           Yes      Yes                                 +---------+---------------+---------+-----------+----------+--------------+ Gastroc  Full           Yes      Yes                                  +---------+---------------+---------+-----------+----------+--------------+ GSV      Full           Yes      Yes                                 +---------+---------------+---------+-----------+----------+--------------+ SSV      Full           Yes      Yes                                 +---------+---------------+---------+-----------+----------+--------------+   Findings reported to [place].  Summary: LEFT: - There is no evidence of deep vein thrombosis in the lower extremity. - There is no evidence of superficial venous thrombosis. - There is no evidence of deep vein thrombosis proximal to the inguinal  ligament or in the common femoral vein. - No Bakers cyst visualized.  *See table(s) above for measurements and observations. Electronically signed by Fonda Rim on 06/14/2024 at 5:26:35 PM.    Final    MR FOOT LEFT WO CONTRAST Result Date: 06/14/2024 MR FOOT WITHOUT IV CONTRAST LEFT COMPARISON: X-ray 11 2025 CLINICAL HISTORY: Foot pain PULSE SEQUENCES: Ax T1, Ax T2 FS, Sag T1, Sag T2 FS, Cor STIR, Cor T1 without contrast. FINDINGS: Bones: There is no fracture or marrow replacing process. No accelerated arthrosis is present. No significant subcutaneous edema or drainable collection is identified. Musculotendinous structures: There is no significant tenosynovitis or tendinosis. No myositis is identified in the intrinsic muscles of the foot. No drainable collection. IMPRESSION: Unremarkable MRI of the left foot. No acute abnormality. No accelerated arthrosis. Electronically signed by: Norleen Satchel MD 06/14/2024 12:47 PM EDT RP Workstation: [id number]   DG Foot Complete Right Result Date: 05/30/2024 CLINICAL DATA:  Pain for a week. EXAM: RIGHT FOOT COMPLETE - 3+ VIEW COMPARISON:  None Available. FINDINGS: There is no evidence of fracture or dislocation. There is no evidence of arthropathy or other focal bone abnormality. Soft tissues are unremarkable. IMPRESSION: Negative.  Electronically Signed   By: Fonda Field M.D.   On: 05/30/2024 08:42   CT ABDOMEN PELVIS W CONTRAST Result Date: 05/21/2024 CLINICAL DATA:  Abdomen pain diarrhea EXAM: CT ABDOMEN AND PELVIS WITH CONTRAST TECHNIQUE: Multidetector CT imaging of the abdomen and pelvis was performed using the standard protocol following bolus administration of intravenous contrast. RADIATION DOSE REDUCTION: This exam was performed according to the departmental dose-optimization program which includes automated exposure control, adjustment of the mA and/or kV according to patient size and/or use of iterative reconstruction technique. CONTRAST:  OMNIPAQUE  IOHEXOL  300 MG/ML  SOLN COMPARISON:  CT 12/09/2023 FINDINGS: Lower chest: Lung bases demonstrate no acute airspace disease. Hepatobiliary: Hepatic steatosis. Cholecystectomy. No biliary dilatation Pancreas: Unremarkable. No pancreatic ductal dilatation or surrounding inflammatory changes. Spleen: Normal in size without focal abnormality. Kidneys show no hydronephrosis.  The bladder is normal Stomach/Bowel: Stomach is within normal limits. Appendix appears normal. No evidence of bowel wall thickening, distention, or inflammatory changes. Vascular/Lymphatic: No significant vascular findings are present. No enlarged abdominal or pelvic lymph nodes. Reproductive: Status post hysterectomy. No adnexal masses. Other: No abdominal wall hernia or abnormality. No abdominopelvic ascites. Musculoskeletal: No acute or significant osseous findings. IMPRESSION: 1. No CT evidence for acute intra-abdominal or pelvic abnormality. 2. Hepatic steatosis. Electronically Signed   By: [name] Bun M.D.   On: 05/21/2024 23:58   DG Abd 2 Views Result Date: 05/02/2024 CLINICAL DATA:  Near syncope. EXAM: ABDOMEN - 2 VIEW COMPARISON:  January 10, 2024 FINDINGS: The bowel gas pattern is normal. There is no evidence of free air. Radiopaque surgical clips are seen overlying the right upper quadrant. No  radio-opaque calculi or other significant radiographic abnormality is seen. IMPRESSION: Negative. Electronically Signed   By: Suzen Dials M.D.   On: 05/02/2024 20:00    CT ANGIO HEAD NECK W WO CM Result Date: 07/09/2024 EXAM: CTA HEAD AND NECK WITHOUT AND WITH 07/09/2024 10:01:55 PM TECHNIQUE: CTA of the head and neck was performed with and without the administration of intravenous contrast. Multiplanar 2D and/or 3D reformatted images are provided for review. Automated exposure control, iterative reconstruction, and/or weight based adjustment of the mA/kV was utilized to reduce the radiation dose to as low as reasonably achievable. Stenosis of the internal carotid arteries measured using NASCET criteria. COMPARISON: MRI  head 03/25/2024 and CTA head and neck 03/25/2024 CLINICAL HISTORY: Syncope/presyncope, cerebrovascular cause suspected. Patient reports feeling like their head is on a roller coaster ride. FINDINGS: CTA NECK: AORTIC ARCH AND ARCH VESSELS: No dissection or arterial injury. No significant stenosis of the brachiocephalic or subclavian arteries. CERVICAL CAROTID ARTERIES: No dissection, arterial injury, or hemodynamically significant stenosis by NASCET criteria. CERVICAL VERTEBRAL ARTERIES: No dissection, arterial injury, or significant stenosis. LUNGS AND MEDIASTINUM: Unremarkable. SOFT TISSUES: No acute abnormality. BONES: No acute abnormality. CTA HEAD: ANTERIOR CIRCULATION: No significant stenosis of the internal carotid arteries. No significant stenosis of the anterior cerebral arteries. No significant stenosis of the middle cerebral arteries. No aneurysm. POSTERIOR CIRCULATION: No significant stenosis of the posterior cerebral arteries. No significant stenosis of the basilar artery. No significant stenosis of the vertebral arteries. No aneurysm. OTHER: No acute intracranial hemorrhage. Grey-white matter differentiation is maintained. No edema, mass effect, or midline shift. The basilar  cisterns are patent. Ventricles are unremarkable. Orbits are symmetric. Calvarium is intact. Mastoid air cells are clear. Paranasal sinuses are clear. No dural venous sinus thrombosis on this non-dedicated study. IMPRESSION: 1. No acute intracranial findings. 2. No large vessel occlusion, hemodynamically significant stenosis, or aneurysm in the head or neck. Electronically signed by: Donnice Mania MD 07/09/2024 10:25 PM EDT RP Workstation: [id number]      ASSESSMENT & PLAN  Emergency Room Instructions: Patient presents with severe pain, allodynia, and possible adverse drug reaction. Rule out serotonin syndrome and neuroleptic malignant syndrome. Consider small fiber neuropathy or complex regional pain syndrome. Review medication list carefully, paying close attention to potential drug interactions. Manage pain appropriately, considering patient's history of medication allergies and sensitivities. Ensure patient has close follow-up with PCP and mental health provider.  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 Action: Wellbutrin  discontinued today.(Was already stopped) Action: Trazodone  discontinued today due to concern for serotonin syndrome. Action: Given patient's report of nausea and vomiting with Toradol  in the past, this medication was held. Action: B12 1000mcg IM administered. Action: Prescribed Percocet 10/325mg  #20, 1 tab q4h prn pain. Action: Ordered CBC, CMP, Vitamin D  level, B12 and Folate Panel. Plan: Monitor for improvement following medication changes. If symptoms worsen or new symptoms develop (especially signs of serotonin syndrome or NMS), instruct patient to go to the Emergency Department immediately. Plan: Strongly consider referral to pain management specialist for evaluation of complex regional  pain syndrome and/or small fiber neuropathy if symptoms persist.  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 A recent increase in Wellbutrin  dosage may have triggered this condition, and fits the timing of the problem best. Although serotonin syndrome and neuroleptic malignant syndrome are considered, Wellbutrin -induced nerve damage is most probable. The condition is reversible but may take months to recover, but generally resolves in 2 weeks.  Has already discontinued Wellbutrin  immediately. Administer a B12 shot to aid nerve healing and a Toradol  shot for pain relief. Prescribe Percocet 10 mg, 20 tablets, to be taken every 4 hours as needed for pain. Consider Journavx  for pain management, pending insurance approval. Order a vitamin D  level to rule out toxicity and stop vitamin D  supplementation until levels are checked.  Fibromyalgia   She has a chronic condition with recent exacerbation, possibly triggered by a hospital  stay and medication changes. Symptoms include widespread musculoskeletal pain and fatigue, likely related to recent stressors and medication adjustments. Continue current management strategies for fibromyalgia and avoid physical therapy temporarily as it may exacerbate pain.  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 Pain may be exacerbated by hyperalgesia syndrome. Recent surgeries on shoulders and neck injury are noted. Consider imaging of the spine if pain persists or worsens. Evaluate for potential nerve blocks if pain management is inadequate.  She declined suboxone or pain mgmt referrals; history of bad experience pain management.    Advised patient we will explore the weight gain issue once we stabilize her extreme pain. Cyclic vomiting syndrome Chronic vomiting syndrome   She experiences chronic vomiting syndrome with daily episodes of vomiting and diarrhea. Levsin  provides some relief. Potential vitamin deficiencies due to chronic vomiting may contribute to current symptoms. Continue Levsin  for vomiting control and evaluate for vitamin deficiencies, including B12. Vitamin D  deficiency Checked in July and she is on high supplement- requested recheck or at least hold medication(s) until it is rechecked, in case of toxicity. Bipolar 1 disorder, depressed, severe (HCC) Bipolar disorder, severe, with history of psychosis   Severe bipolar disorder with a history of psychosis is present. Recent medication changes have destabilized her mental health. Current medications include Latuda  and trazodone . There is a risk of manic episodes if Latuda  is discontinued. Continue Latuda  for bipolar disorder management and discontinue trazodone  to reduce the risk of serotonin syndrome. Monitor mental health closely, especially if medication changes are made.  PDMP reviewed during this encounter.     ORDER ASSOCIATIONS  #   DIAGNOSIS / CONDITION ICD-10 ENCOUNTER ORDER     ICD-10-CM   1. Allodynia  R20.8 ketorolac  (TORADOL ) injection 60 mg    cyanocobalamin  (VITAMIN B12) injection 1,000 mcg    Suzetrigine  (JOURNAVX ) 50 MG TABS    oxyCODONE -acetaminophen  (PERCOCET) 10-325 MG tablet    2. Weight gain  R63.5     3. Right foot pain  M79.671 Suzetrigine  (JOURNAVX ) 50 MG TABS    4. Left foot pain  M79.672 Suzetrigine  (JOURNAVX ) 50 MG TABS    5. Fibromyalgia  M79.7 Suzetrigine  (JOURNAVX ) 50 MG TABS    6. Chronic pain of both shoulders  M25.511 Suzetrigine  (JOURNAVX ) 50 MG TABS   G89.29    M25.512     7. Neck pain  M54.2 Suzetrigine  (JOURNAVX ) 50 MG TABS    8. Cyclic vomiting syndrome  R11.15 Vitamin D  (25 hydroxy)    Suzetrigine   (JOURNAVX ) 50 MG TABS    CBC with Differential/Platelet    Comp Met (CMET)    B12 and Folate Panel    9. Vitamin D  deficiency  E55.9 Vitamin D  (25 hydroxy)    10. Bipolar 1 disorder, depressed, severe (HCC)  F31.4       Diagnoses and all orders for this visit: Allodynia -     ketorolac  (TORADOL ) injection 60 mg -     cyanocobalamin  (VITAMIN B12) injection 1,000 mcg -     Suzetrigine  (JOURNAVX ) 50 MG TABS; Take 1 tablet by mouth 2 (two) times daily as needed. -     oxyCODONE -acetaminophen  (PERCOCET) 10-325 MG tablet; Take 1 tablet by mouth every 4 (four) hours as needed for up to 5 days for pain. Weight gain Right foot pain -  Suzetrigine  (JOURNAVX ) 50 MG TABS; Take 1 tablet by mouth 2 (two) times daily as needed. Left foot pain -     Suzetrigine  (JOURNAVX ) 50 MG TABS; Take 1 tablet by mouth 2 (two) times daily as needed. Fibromyalgia -     Suzetrigine  (JOURNAVX ) 50 MG TABS; Take 1 tablet by mouth 2 (two) times daily as needed. Chronic pain of both shoulders -     Suzetrigine  (JOURNAVX ) 50 MG TABS; Take 1 tablet by mouth 2 (two) times daily as needed. Neck pain -     Suzetrigine  (JOURNAVX ) 50 MG TABS; Take 1 tablet by mouth 2 (two) times daily as needed. Cyclic vomiting syndrome -     Vitamin D  (25 hydroxy) -     Suzetrigine  (JOURNAVX ) 50 MG TABS; Take 1 tablet by mouth 2 (two) times daily as needed. -     CBC with Differential/Platelet -     Comp Met (CMET) -     B12 and Folate Panel Vitamin D  deficiency -     Vitamin D  (25 hydroxy) Bipolar 1 disorder, depressed, severe (HCC)     Recommended follow up: Next available.      Time-Based Attestation for Established Patient E/M Service  I personally spent a total of 71 minutes on 07/12/2024 providing medically necessary, face-to-face evaluation and management for this established patient, who was new to my care. The encounter addressed severe idiopathic new-onset hyperalgesia and allodynia, chronic pain exacerbation,  high-risk medication management, and psychiatric comorbidity. Extended time was required due to diagnostic uncertainty, failed prior treatments, reconciliation of conflicting medication and allergy histories, and intensive counseling and care coordination... ultimately to the emergency room.  Time was spent on the following medically necessary activities:  Comprehensive review of prior records, hospitalizations, imaging, and laboratory data to clarify the etiology of acute pain and rule out emergent causes. Detailed history-taking from both patient and family regarding pain chronology, medication changes, adverse reactions, and psychiatric history. Thorough physical examination focusing on musculoskeletal and neurological findings, including assessment for potential serotonin syndrome or neuroleptic malignant syndrome. Medical decision-making regarding discontinuation of Wellbutrin  and Trazodone  due to suspected drug-induced small fiber neuropathy, initiation of alternative pain management, and adjustment of psychiatric regimen. Extensive counseling and education provided to patient and family regarding diagnosis, risks, medication side effects, and red-flag symptoms requiring emergency evaluation. Coordination of care with pharmacy, laboratory, and pain management, including placing orders for laboratory evaluation of vitamin deficiencies and toxicity, and ensuring safe transition to emergency services. Documentation of collaborative treatment planning, patient's active participation in EMR review, and establishment of follow-up plan. Medical Necessity Statement: The extended time was medically necessary to address diagnostic uncertainty, reconcile high-risk medication regimens, prevent adverse drug events, and ensure continuity of care during transition to emergency services. This time investment was essential to prevent escalation to hospitalization, psychiatric destabilization, or life-threatening  complications.  Complexity Justification: This encounter required outlier-level time for an established patient due to multiple active high-risk conditions, medication adverse effects, failed prior therapies, intensive care coordination, and significant psychosocial barriers. The time spent aligns with CMS benchmarks for high-complexity established patient visits [phone]) in primary care when multiple risk factors and diagnostic uncertainty are present.  Attestation: I attest that all time documented above was personally and directly spent by me on medically necessary patient care activities as described, in accordance with CMS and CPT guidelines.        Additional notes: This document was synthesized by artificial intelligence ([employer]) using HIPAA-compliant recording of the clinical interaction;  We discussed the use of AI scribe software for clinical note transcription with the patient, who gave verbal consent to proceed.    Additional Info: This encounter employed state-of-the-art, real-time, collaborative documentation. The patient actively reviewed and assisted in updating their electronic medical record on a shared screen, ensuring transparency and facilitating joint problem-solving for the problem list, overview, and plan. This approach promotes accurate, informed care. The treatment plan was discussed and reviewed in detail, including medication safety, potential side effects, and all patient questions. We confirmed understanding and comfort with the plan. Follow-up instructions were established, including contacting the office for any concerns, returning if symptoms worsen, persist, or new symptoms develop, and precautions for potential emergency department visits.  Initial Appointment Goals:  This initial visit focused on establishing a foundation for the patient's care. We collaboratively reviewed her medical history and medications in detail, updating the chart as shown in the encounter.  Given the extensive information, we prioritized addressing her most pressing concerns, which she reported were: New Pt (Pt is present to est care with pcp would like referral nutrition )  While the complexity of the patient's medical picture may necessitate further evaluation in subsequent visits, we were able to develop a preliminary care plan together. To expedite a comprehensive plan at the next visit, we encouraged the patient to gather relevant medical records from previous providers. This collaborative approach will ensure a more complete understanding of the patient's health and inform the development of a personalized care plan. We look forward to continuing the conversation and working together with the patient on achieving her health goals.   Collaborative Documentation:  Today's encounter utilized real-time, dynamic patient engagement.  Patients actively participate by directly reviewing and assisting in updating their medical records through a shared screen. This transparency empowers patients to visually confirm chart updates made by the healthcare provider.  This collaborative approach facilitates problem management as we jointly update the problem list, problem overview, and assessment/plan. Ultimately, this process enhances chart accuracy and completeness, fostering shared decision-making, patient education, and informed consent for tests and treatments.  Collaborative Treatment Planning:  Treatment plans were discussed and reviewed in detail.  Explained medication safety and potential side effects.  Encouraged participation and answered all patient questions, confirming understanding and comfort with the plan. Encouraged patient to contact our office if they have any questions or concerns. Agreed on patient returning to office if symptoms worsen, persist, or new symptoms develop.  ----------------------------------------------------- Bernardino KANDICE Cone, MD  07/12/2024 3:13 PM  [facility]:  [phone]

## 2024-07-12 NOTE — Telephone Encounter (Signed)
 Pharmacy Patient Advocate Encounter   Received notification from Onbase that prior authorization for Journavx  50MG  tablets is required/requested.   Insurance verification completed.   The patient is insured through [facility] .   Per test claim: Journavx  is not covered by the insurance. Alternative preferred medications are: Ibuprofen, Naproxen , Diclofenac, Flurbiprofen, Sulindac.

## 2024-07-16 ENCOUNTER — Ambulatory Visit: Admitting: Surgical

## 2024-07-16 ENCOUNTER — Telehealth: Payer: Self-pay

## 2024-07-16 ENCOUNTER — Ambulatory Visit (INDEPENDENT_AMBULATORY_CARE_PROVIDER_SITE_OTHER): Admitting: Licensed Clinical Social Worker

## 2024-07-16 ENCOUNTER — Ambulatory Visit (HOSPITAL_COMMUNITY)

## 2024-07-16 DIAGNOSIS — M25512 Pain in left shoulder: Secondary | ICD-10-CM

## 2024-07-16 DIAGNOSIS — F314 Bipolar disorder, current episode depressed, severe, without psychotic features: Secondary | ICD-10-CM

## 2024-07-16 MED ORDER — OXYCODONE-ACETAMINOPHEN 5-325 MG PO TABS: 1.0000 | ORAL_TABLET | ORAL | 0 refills | Status: DC | PRN

## 2024-07-16 NOTE — Psych (Signed)
 [facility] PHP THERAPIST PROGRESS NOTE  Gina [name] [id number]   Session Time: 9:00 am - 10:00 am  Participation Level: Active  Behavioral Response: CasualAlertAnxious and Depressed  Type of Therapy: Group Therapy  Treatment Goals addressed: Coping  Progress Towards Goals: Initial  Interventions: CBT, DBT, Solution Focused, Strength-based, Supportive, and Reframing  Therapist Response: Clinician led check-in regarding current stressors and situation, and review of patient completed daily inventory. Clinician utilized active listening and empathetic response and validated patient emotions. Clinician facilitated processing group on pertinent issues.?   Summary: Patient arrived within time allowed. Patient rates her depression at a 6 and anxiety at a 6 on a scale of 1-10 with 10 being best. Pt reports she has been struggling with numerous physical health issues. Weird final destination stuff keeps happening to me. She also shares that she has been estranged from her oldest daughter since February but doesn't know why her daughter isn't speaking to her. When asked about sleep and appetite, pt reports she slept 7 hours last night and ate 2 meals yesterday. Pt denied experiencing SI/SH thoughts and feelings of hopelessness. Pt able to process.?Pt engaged in discussion.?      Session Time: 10:00 am - 11:00 am  Participation Level: Active  Behavioral Response: CasualAlertAnxious and Depressed  Type of Therapy: Group Therapy  Treatment Goals addressed: Coping  Progress Towards Goals: Initial  Interventions: CBT, DBT, Solution Focused, Strength-based, Supportive, and Reframing  Therapist Response: Clinician led processing group for pt's current struggles. Group members shared stressors and provided support and feedback. Clinician brought in topics of boundaries and vulnerability to inform discussion.  Summary: Pt able to process and provide support to group.     Session  Time: 11:00 am - 12:00 pm  Participation Level: Active  Behavioral Response: CasualAlertAnxious and Depressed  Type of Therapy: Group Therapy  Treatment Goals addressed: Coping  Progress Towards Goals: Initial  Interventions: CBT, DBT, Solution Focused, Strength-based, Supportive, and Reframing  Therapist Response: Group viewed Ted Talk entitled The Power of Vulnerability by Daune Daring and then cln led discussion surrounding vulnerability. Clinician utilized CBT principles to inform discussion.    Summary: Pt engaged in discussion and demonstrated fair insight into the subject matter.   Session Time: 12:00 pm - 12:30 pm  Participation Level: Active  Behavioral Response: CasualAlertAnxious and Depressed  Type of Therapy: Group Therapy  Treatment Goals addressed: Coping  Progress Towards Goals: Initial  Interventions: Psychologist, occupational, Supportive  Therapist Response: Reflection Group: Patients encouraged to practice skills and interpersonal techniques or work on mindfulness and relaxation techniques. The importance of self-care and making skills part of a routine to increase usage were stressed.  Summary: Patient engaged and participated appropriately.   Session Time: 12:30 pm - 1:30 pm  Participation Level: Active  Behavioral Response: CasualAlertAnxious and Depressed  Type of Therapy: Group Therapy  Treatment Goals addressed: Coping  Progress Towards Goals: Initial  Interventions: CBT, DBT, Solution Focused, Strength-based, Supportive, and Reframing  Therapist Response: Group was led by occupational therapist, Edward Hollan.   Summary: Pt engaged and participated in discussion.   Session Time: 1:30 pm - 2:00 pm  Participation Level: Active  Behavioral Response: CasualAlertAnxious and Depressed  Type of Therapy: Group Therapy  Treatment Goals addressed: Coping  Progress Towards Goals: Initial  Interventions: CBT, DBT, Solution Focused,  Strength-based, Supportive, and Reframing  Therapist Response: 1:30 pm - 1:50 pm: Clinician utilized a guided meditation video led by yoga instructor Adriene of [employer]  that focuses on mental balance and grounding. Patients were invited to share their responses upon completion.  1:50 - 2:00 pm: Clinician led check-out. Clinician assessed for immediate needs, medication compliance and efficacy, and safety concerns?  Summary: 1:30 pm - 1:50 pm: Pt participated in activity. 1:50 - 2:00 pm: At check-out, patient contracts for safety.?Patient demonstrates progress as evidenced by her engagement and by being receptive to treatment. Patient denies SI/HI/self-harm thoughts at the end of group and agrees to seek help should those thoughts/feelings occur.?    Suicidal/Homicidal: Nowithout intent/plan  Plan: ?Pt will continue in PHP and medication management while continuing to work on decreasing depression symptoms,?SI, and anxiety symptoms,?and increasing the ability to self manage symptoms.     Collaboration of Care: Other none required for this visit  Patient/Guardian was advised Release of Information must be obtained prior to any record release in order to collaborate their care with an outside provider. Patient/Guardian was advised if they have not already done so to contact the registration department to sign all necessary forms in order for us  to release information regarding their care.   Consent: Patient/Guardian gives verbal consent for treatment and assignment of benefits for services provided during this visit. Patient/Guardian expressed understanding and agreed to proceed.   Diagnosis: Bipolar 1 disorder, depressed, severe (HCC) [F31.4]    1. Bipolar 1 disorder, depressed, severe (HCC)       Will [name], LCSW 07/16/2024

## 2024-07-16 NOTE — Telephone Encounter (Signed)
 Copied from CRM #[id number]. Topic: Clinical - Prescription Issue >> Jul 16, 2024  8:33 AM Precious C wrote: Reason for CRM: Pt called in because she was prescribed medication (oxyCODONE -acetaminophen  (PERCOCET) 10-325 MG tablet) and she misplaced or lost the medication when she went to the ED on Friday.  Pt is requesting another Rx be sent to pharmacy of pick up. Pt would like a callback and requested to leave a VCM confirmation and/or follow up on what to do moving forward. Pt apologizes as she had never done this before.  Callback: [phone]  Tried to call pt to let her know we are not able to fill this its a controlled substance medication and we can not replace them per provider. Left her voicemail

## 2024-07-17 ENCOUNTER — Ambulatory Visit (INDEPENDENT_AMBULATORY_CARE_PROVIDER_SITE_OTHER)

## 2024-07-17 ENCOUNTER — Encounter (HOSPITAL_COMMUNITY): Payer: Self-pay | Admitting: Licensed Clinical Social Worker

## 2024-07-17 DIAGNOSIS — F314 Bipolar disorder, current episode depressed, severe, without psychotic features: Secondary | ICD-10-CM | POA: Diagnosis not present

## 2024-07-17 NOTE — Psych (Signed)
 [facility] PHP THERAPIST PROGRESS NOTE  Gina [name] [id number]   Session Time: 9:00 am - 10:00 am  Participation Level: Active  Behavioral Response: CasualAlertAnxious and Depressed  Type of Therapy: Group Therapy  Treatment Goals addressed: Coping  Progress Towards Goals: Initial  Interventions: CBT, DBT, Solution Focused, Strength-based, Supportive, and Reframing  Therapist Response: Clinician led check-in regarding current stressors and situation, and review of patient completed daily inventory. Clinician utilized active listening and empathetic response and validated patient emotions. Clinician facilitated processing group on pertinent issues.?   Summary: Patient arrived within time allowed. Patient rates her depression at a 5 and anxiety at a 2 on a scale of 1-10 with 10 being best. Pt reports she saw her orthopedic surgeon yesterday and the appt went well. She discusses how her physical health issues impact her mental health and states her goal for the day is to be in the moment. When asked about sleep and appetite, pt reports she slept 8 hours last night and ate 2 meals yesterday. Pt denied experiencing SI/SH thoughts and feelings of hopelessness since last session. Pt able to process.?Pt engaged in discussion.?      Session Time: 10:00 am - 11:00 am  Participation Level: Active  Behavioral Response: CasualAlertAnxious and Depressed  Type of Therapy: Group Therapy  Treatment Goals addressed: Coping  Progress Towards Goals: Initial  Interventions: CBT, DBT, Solution Focused, Strength-based, Supportive, and Reframing  Therapist Response: Clinician led processing group for pt's current struggles. Group members shared stressors and provided support and feedback. Clinician brought in topics of acceptance to inform discussion.  Summary: Pt able to process and provide support to group.     Session Time: 11:00 am - 12:00 pm  Participation Level:  Active  Behavioral Response: CasualAlertAnxious and Depressed  Type of Therapy: Group Therapy  Treatment Goals addressed: Coping  Progress Towards Goals: Initial  Interventions: CBT, DBT, Solution Focused, Strength-based, Supportive, and Reframing  Therapist Response: Group was led by [facility] chaplain, Amy Delores.  Summary: Pt engaged in discussion.    Session Time: 12:00 pm - 12:30 pm  Participation Level: Active  Behavioral Response: CasualAlertAnxious and Depressed  Type of Therapy: Group Therapy  Treatment Goals addressed: Coping  Progress Towards Goals: Initial  Interventions: Psychologist, occupational, Supportive  Therapist Response: Reflection Group: Patients encouraged to practice skills and interpersonal techniques or work on mindfulness and relaxation techniques. The importance of self-care and making skills part of a routine to increase usage were stressed.  Summary: Patient engaged and participated appropriately.   Session Time: 12:30 pm - 1:30 pm  Participation Level: Active  Behavioral Response: CasualAlertAnxious, Depressed, and Irritable  Type of Therapy: Group Therapy  Treatment Goals addressed: Coping  Progress Towards Goals: Initial  Interventions: CBT, DBT, Solution Focused, Strength-based, Supportive, and Reframing  Therapist Response: Clinican led group on radical acceptance. Cln defined radical acceptance and invited patients to provide examples of things they have trouble accepting. Cln asked patients to identify barriers to acceptance and to consider what would be different if they were to accept the aforementioned situations. Lastly, cln stressed the importance of accepting all emotions that accompany a given stressor, which will aid in overall acceptance of the situation.  Summary: Pt engaged and participated in discussion. Pt made statements about how she has accepted her current situation and no longer cares about her daughter cutting  her off. When challenged on this, as pt was discussing acceptance while demonstrating avoidance of emotions, she became upset and  stated she was going to leave for the day, but decided to take a break instead when reminded of the attendance policy. She reported feeling enraged regarding the situation with her daughter and expressed her desire to visit a rage room. She demonstrated gaps in insight into the subject matter and how it relates to her primary stressor.   Session Time: 1:30 pm - 2:00 pm  Participation Level: Active  Behavioral Response: CasualAlertAnxious, Depressed, and Irritable  Type of Therapy: Group Therapy  Treatment Goals addressed: Coping  Progress Towards Goals: Initial  Interventions: CBT, DBT, Solution Focused, Strength-based, Supportive, and Reframing  Therapist Response: 1:30 pm - 2:00 pm: Clinician led check-out. Clinician assessed for immediate needs, medication compliance and efficacy, and safety concerns?  Summary: 1:30 pm - 2:00 pm: At check-out, patient contracts for safety.?Patient demonstrates progress as evidenced by her engagement and by being receptive to treatment. Patient denies SI/HI/self-harm thoughts at the end of group and agrees to seek help should those thoughts/feelings occur.?    Suicidal/Homicidal: Nowithout intent/plan  Plan: ?Pt will continue in PHP and medication management while continuing to work on decreasing depression symptoms,?SI, and anxiety symptoms,?and increasing the ability to self manage symptoms.       Collaboration of Care: Medication Management AEB Staci Kerns, NP  Patient/Guardian was advised Release of Information must be obtained prior to any record release in order to collaborate their care with an outside provider. Patient/Guardian was advised if they have not already done so to contact the registration department to sign all necessary forms in order for us  to release information regarding their care.   Consent:  Patient/Guardian gives verbal consent for treatment and assignment of benefits for services provided during this visit. Patient/Guardian expressed understanding and agreed to proceed.   Diagnosis: Bipolar 1 disorder, depressed, severe (HCC) [F31.4]    1. Bipolar 1 disorder, depressed, severe (HCC)       Will [name], LCSW 07/17/2024

## 2024-07-17 NOTE — Progress Notes (Signed)
 Psychiatric Initial Adult Assessment   Patient Identification: Gina [name] MRN:  [id number] Date of Evaluation:  07/17/2024 Referral Source: [place] [facility] Chief Complaint: Depression and anxiety  Visit Diagnosis:    ICD-10-CM   1. Bipolar 1 disorder, depressed, severe (HCC)  F31.4       History of Present Illness:  Gina [name] is a 48 year old female who presents after recent inpatient admission.  She reports a history of major depressive disorder, generalized anxiety disorder and bipolar disorder.  States she is currently followed by psychiatrist Jess. ([facility]) states she is prescribed Lexapro , Latuda  and Ativan .  States she has been taking and tolerating medications well.  States she recently had her medications taper with Center in a tailspin.  States she was experiencing suicidal ideation.  Denying plan or intent.  Reports she had been off of her Wellbutrin  for the past 5 days.  States her mood is stabilized since her discharge.  Patient to start care for [facility] urgent care Practical outpatient programming on 07/16/2024  Christon reports she had been hospitalized twice in her adult life.  States she is currently followed by therapist Venyetta.  She reports a history of childhood trauma.  Reports a family history of mental illness however denies official diagnoses.  States she previously attended intensive outpatient program in 2019.  Reports a fair appetite.  States she is resting okay throughout the night.  Reports she has been married for the past 30 years.  States her significant other is supportive.  States she has a 46 year old and a 46 year old.  During evaluation Gina [name] is sitting pleasant; she is alert/oriented x 4; calm/cooperative; and mood congruent with affect.  Patient is speaking in a clear tone at moderate volume, and normal pace; with good eye contact.Her thought process is coherent and relevant; There is no indication that she is currently  responding to internal/external stimuli or experiencing delusional thought content.  Patient denies suicidal/self-harm/homicidal ideation, psychosis, and paranoia.  Patient has remained calm throughout assessment and has answered questions appropriately.   Associated Signs/Symptoms: Depression Symptoms:  depressed mood, anxiety, (Hypo) Manic Symptoms:  Distractibility, Anxiety Symptoms:  Excessive Worry, Psychotic Symptoms:  Hallucinations: None PTSD Symptoms: Reports a history related to childhood trauma.  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  States she is currently on disability.  Past Psychiatric History: Carries a diagnosis related to major depressive disorder, bipolar 1 disorder, generalized anxiety disorder.  Currently followed by therapy and psychiatry.  Prescribed Latuda , Ativan  and Lexapro .  Previous Psychotropic Medications: Yes   Substance Abuse History in the last 12 months:  No.  Consequences of Substance Abuse: NA  Past Medical History:  Past Medical History:  Diagnosis Date   ABDOMINAL WALL HERNIA 02/27/2010   Abnormal Pap smear    ALLERGIC RHINITIS 06/02/2007   ANOREXIA, CHRONIC 08/07/2008   ANXIETY 09/15/2010   Arthritis    BACK PAIN, THORACIC REGION 07/07/2007   Bipolar disorder (HCC)    CERVICAL RADICULOPATHY 12/11/2008   Complication of anesthesia    Pateint reprots a high tolerance   Complication of anesthesia    pt states she has not had her voice completely back since surgery in Nov. 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   Eating disorder    ENDOMETRIOSIS 12/16/2009   FATIGUE 06/11/2010   Fibromyalgia  GERD 10/23/2009   Heart palpitations    HERPES SIMPLEX INFECTION 06/11/2010   LENTIGO 04/23/2009   LUNG NODULE 06/17/2010   Narcotic abuse, continuous (HCC)    pt denies    Ovarian cyst    Palpitations 10/23/2009   Panic disorder    Rheumatoid arthritis(714.0)    Stricture and stenosis of esophagus 05/13/2010   TOBACCO ABUSE 08/07/2008   Urinary tract infection    UTI (urinary tract infection)     Past Surgical History:  Procedure Laterality Date   ABDOMINAL SURGERY     ABDOMINAL WALL MESH  REMOVAL     ABLATION ON ENDOMETRIOSIS     CESAREAN SECTION     x2    CYSTOSCOPY N/A 11/22/2023   Procedure: CYSTOSCOPY;  Surgeon: Elisabeth Valli BIRCH, MD;  Location: [facility];  Service: Urology;  Laterality: N/A;   DILATION AND CURETTAGE OF UTERUS     x1    ENDOMETRIAL ABLATION     esophageal dilatation x 4     GUM SURGERY     HERNIA REPAIR     X3   PARTIAL HYSTERECTOMY     POSTERIOR LUMBAR FUSION 2 WITH HARDWARE REMOVAL Left 03/29/2024   Procedure: ARTHROSCOPY, SHOULDER WITH DEBRIDEMENT;  Surgeon: Addie Cordella Hamilton, MD;  Location: [facility];  Service: Orthopedics;  Laterality: Left;  LEFT SHOULDER ARTHROSCOPY, DEBRIDEMENT, DISTAL CLAVICLE EXCISION   RESECTION DISTAL CLAVICAL Left 03/29/2024   Procedure: EXCISION, CLAVICLE, DISTAL, OPEN;  Surgeon: Addie Cordella Hamilton, MD;  Location: [facility] OR;  Service: Orthopedics;  Laterality: Left;   SHOULDER SURGERY Right 09/2023    Family Psychiatric History: Undiagnosed  Family History:  Family History  Problem Relation Age of Onset   Depression Mother    Arthritis Mother    Hyperlipidemia Father    Hypertension Father    Heart attack Father    Heart disease Father    Cancer Father    Diabetes Father    Stroke Father     Social History:   Social History   Socioeconomic History   Marital status: Married    Spouse name: Not on file   Number of children: 2   Years of education: Not on file   Highest education level: Master's degree (e.g., MA, MS, MEng, MEd, MSW, MBA)  Occupational History   Not on file  Tobacco Use   Smoking status: Former    Current packs/day: 0.20    Types: Cigarettes   Smokeless tobacco:  Never   Tobacco comments:    2-3 cigarettes per day as of May 2025  Vaping Use   Vaping status: Some Days  Substance and Sexual Activity   Alcohol use: No   Drug use: No   Sexual activity: Yes    Birth control/protection: Surgical  Other Topics Concern   Not on file  Social History Narrative   Not on file   Social Drivers of Health   Financial Resource Strain: Low Risk  (04/30/2024)   Overall Financial Resource Strain (CARDIA)    Difficulty of Paying Living Expenses: Not hard at all  Food Insecurity: No Food Insecurity (07/04/2024)   Hunger Vital Sign    Worried About Running Out of Food in the Last Year: Never true    Ran Out of Food in the Last Year: Never true  Transportation Needs: No Transportation Needs (07/04/2024)   PRAPARE - Administrator, Civil Service (Medical): No    Lack of Transportation (Non-Medical): No  Physical Activity: Inactive (04/30/2024)  Exercise Vital Sign    Days of Exercise per Week: 0 days    Minutes of Exercise per Session: 0 min  Stress: No Stress Concern Present (04/30/2024)   [employer] of Occupational Health - Occupational Stress Questionnaire    Feeling of Stress: Only a little  Social Connections: Moderately Integrated (04/30/2024)   Social Connection and Isolation Panel    Frequency of Communication with Friends and Family: Three times a week    Frequency of Social Gatherings with Friends and Family: Twice a week    Attends Religious Services: More than 4 times per year    Active Member of [employer] or Organizations: No    Attends Banker Meetings: Never    Marital Status: Married    Additional Social History:   Allergies:   Allergies  Allergen Reactions   Lamictal [Lamotrigine] Anaphylaxis and Other (See Comments)    STEVEN JOHNSON'S SYNDROME   Maxalt  [Rizatriptan ] Anaphylaxis   Morphine  And Codeine  Other (See Comments)    Makes pt very mean I get mean Makes pt very mean Makes pt very mean    Nicotine  Swelling and Palpitations    Other reaction(s): Respiratory Distress (ALLERGY/intolerance), Swelling (ALLERGY/intolerance), Tachycardia / Palpitations  (intolerance) Patches, lozenges Patches caused palpations lozenges throat swelled   Zofran  Anaphylaxis   Chantix  [Varenicline  Tartrate] Other (See Comments)     Diaphoresis, sweating.  Night terrors.  went crazy   Ciprofloxacin  Diarrhea and Nausea And Vomiting   Codeine  Nausea And Vomiting   Haldol  [Haloperidol ] Itching, Rash and Other (See Comments)    FLUSHING   Ibuprofen Nausea And Vomiting   Lidoderm  [Lidocaine ] Other (See Comments)    DOESN'T WORK FOR PATIENT   Skelaxin [Metaxalone] Other (See Comments)    Pt does not remember reaction, REAL BAD REACTION   Sulfa Antibiotics Diarrhea, Other (See Comments) and Nausea And Vomiting    GI PAINS ALSO GI PAINS ALSO    Sulfasalazine Diarrhea    GI PAINS   Toradol  [Ketorolac  Tromethamine ] Nausea And Vomiting   Ambien [Zolpidem] Other (See Comments)    Mental status changes   Doxycycline      Other Reaction(s): GI Intolerance   Duloxetine Other (See Comments)    Unknown reaction   Ingrezza [Valbenazine Tosylate] Hives   Lyrica [Pregabalin] Other (See Comments)    Unknown reaction type   Methadone     seizures   Neurontin  [Gabapentin ] Nausea And Vomiting   Other     Pt states her body does not react well to the new type of prep must use betadine   Prednisone  Other (See Comments)    Interaction to other medications   Wellbutrin  [Bupropion ]    Amoxicillin Rash    Has patient had a PCN reaction causing immediate rash, facial/tongue/throat swelling, SOB or lightheadedness with hypotension: No Has patient had a PCN reaction causing severe rash involving mucus membranes or skin necrosis: No Has patient had a PCN reaction that required hospitalization: No Has patient had a PCN reaction occurring within the last 10 years: No If all of the above answers are NO, then may  proceed with Cephalosporin use.    Aripiprazole Anxiety    hallucinations   Chlorhexidine  Itching and Rash   Penicillins Rash    Other reaction(s): Urticaria / Hives (ALLERGY) Has patient had a PCN reaction causing immediate rash, facial/tongue/throat swelling, SOB or lightheadedness with hypotension: No Has patient had a PCN reaction causing severe rash involving mucus membranes or skin necrosis: No Has patient  had a PCN reaction that required hospitalization: No Has patient had a PCN reaction occurring within the last 10 years: No If all of the above answers are NO, then may proceed with Cephalosporin use.   Tramadol  Rash    Metabolic Disorder Labs: No results found for: HGBA1C, MPG No results found for: PROLACTIN Lab Results  Component Value Date   CHOL 131 02/02/2007   TRIG 59 02/02/2007   HDL 38.9 (L) 02/02/2007   CHOLHDL 3.4 CALC 02/02/2007   VLDL 12 02/02/2007   LDLCALC 80 02/02/2007   Lab Results  Component Value Date   TSH 1.781 07/04/2024    Therapeutic Level Labs: Lab Results  Component Value Date   LITHIUM 0.45 (L) 02/09/2007   No results found for: CBMZ No results found for: VALPROATE  Current Medications: Current Outpatient Medications  Medication Sig Dispense Refill   calcium carbonate (TUMS - DOSED IN MG ELEMENTAL CALCIUM) 500 MG chewable tablet Chew 1 tablet by mouth 4 (four) times daily as needed for indigestion or heartburn.     Cholecalciferol (VITAMIN D3 PO) Take 2 each by mouth daily. Gummy     diazepam  (VALIUM ) 5 MG tablet Take 1 tablet (5 mg total) by mouth 2 (two) times daily as needed for muscle spasms. 10 tablet 0   EC-RX Testosterone 0.2 % CREA Place onto the skin.     escitalopram  (LEXAPRO ) 10 MG tablet Take 10 mg by mouth daily.     hyoscyamine  (LEVSIN  SL) 0.125 MG SL tablet Place 0.125 mg under the tongue every 4 (four) hours as needed for cramping.     LORazepam  (ATIVAN ) 1 MG tablet Take 1 mg by mouth every 8 (eight) hours  as needed for anxiety.     lurasidone  (LATUDA ) 40 MG TABS tablet Take 40 mg by mouth every evening.     methocarbamol  (ROBAXIN ) 500 MG tablet TAKE 1 TABLET BY MOUTH EVERY 8 HOURS AS NEEDED FOR UP TO 7 DAYS 21 tablet 0   oxyCODONE -acetaminophen  (PERCOCET/ROXICET) 5-325 MG tablet Take 1 tablet by mouth every 4 (four) hours as needed for severe pain (pain score 7-10). 30 tablet 0   Oyster Shell (OYSTER CALCIUM) 500 MG TABS tablet Take 500 mg of elemental calcium by mouth daily.     PRESCRIPTION MEDICATION Apply 1-2 Applications topically at bedtime. Testosterone 4% cream     promethazine  (PHENERGAN ) 25 MG tablet Take 25 mg by mouth every 6 (six) hours as needed for nausea or vomiting.     Suzetrigine  (JOURNAVX ) 50 MG TABS Take 1 tablet by mouth 2 (two) times daily as needed. 60 tablet 5   No current facility-administered medications for this visit.    Musculoskeletal: Strength & Muscle Tone: within normal limits Gait & Station: normal Patient leans: N/A  Psychiatric Specialty Exam: Review of Systems  Psychiatric/Behavioral:  Negative for behavioral problems, decreased concentration and suicidal ideas. The patient is nervous/anxious.     Last menstrual period 01/01/2004.There is no height or weight on file to calculate BMI.  General Appearance: Casual  Eye Contact:  Good  Speech:  Clear and Coherent  Volume:  Normal  Mood:  Anxious and Depressed  Affect:  Congruent  Thought Process:  Coherent  Orientation:  Full (Time, Place, and Person)  Thought Content:  Logical  Suicidal Thoughts:  No  Homicidal Thoughts:  No  Memory:  Immediate;   Good Recent;   Good  Judgement:  Good  Insight:  Good  Psychomotor Activity:  Normal  Concentration:  Concentration: Good  Recall:  Good  Fund of Knowledge:Good  Language: Good  Akathisia:  No  Handed:  Right  AIMS (if indicated):  not done  Assets:  Communication Skills Desire for Improvement  ADL's:  Intact  Cognition: WNL  Sleep:  Fair    Screenings: AUDIT    Flowsheet Row [facility]  Alcohol Use Disorder Identification Test Final Score (AUDIT) 0   GAD-7    Flowsheet Row Counselor from 07/16/2024 in [facility] Office Visit from 04/30/2024 in [facility] [place]  Total GAD-7 Score 9 5   PHQ2-9    Flowsheet Row Counselor from 07/16/2024 in [facility] Office Visit from 04/30/2024 in [facility] [place] Office Visit from 12/06/2019 in [facility] at [place] Telemedicine from 05/25/2019 in [facility] Care at [place] [facility]icine from 03/12/2019 in [facility] Care at [facility]  PHQ-2 Total Score 4 0 0 1 0  PHQ-9 Total Score 15 9 -- -- --   Flowsheet Row ED from 07/12/2024 in [facility] at [facility] ED from 07/09/2024 in [facility] Department at [facility] Admission (Discharged) [facility]  C-SSRS RISK CATEGORY No Risk No Risk No Risk    Assessment and Plan:  Patient to start partial hospitalization outpatient programming [facility] - Continue medications as directed  Collaboration of Care: Psychiatrist [facility] psychiatrist Jess  patient enrolled in Partial Hospitalization Program, patient's current medications are to be continued, the following medications are being continued and a comprehensive treatment plan will be developed and side effects of medications have been reviewed with patient  Treatment options and alternatives reviewed with patient and patient understands the above plan. Treatment plan was reviewed and agreed upon by NP T.Ezzard and patient Kiersten Coss need for group services.  Patient/Guardian was advised Release of Information must be obtained prior to any record release in order to collaborate their care with an outside provider.  Patient/Guardian was advised if they have not already done so to contact the registration department to sign all necessary forms in order for us  to release information regarding their care.   Consent: Patient/Guardian gives verbal consent for treatment and assignment of benefits for services provided during this visit. Patient/Guardian expressed understanding and agreed to proceed.   Staci LOISE Ezzard, NP 9/16/202510:34 AM

## 2024-07-18 ENCOUNTER — Telehealth (HOSPITAL_COMMUNITY): Payer: Self-pay | Admitting: Licensed Clinical Social Worker

## 2024-07-18 ENCOUNTER — Ambulatory Visit (HOSPITAL_COMMUNITY)

## 2024-07-18 ENCOUNTER — Ambulatory Visit: Admitting: Internal Medicine

## 2024-07-18 ENCOUNTER — Telehealth (HOSPITAL_COMMUNITY): Payer: Self-pay | Admitting: Professional

## 2024-07-18 ENCOUNTER — Encounter: Payer: Self-pay | Admitting: Internal Medicine

## 2024-07-18 VITALS — BP 100/60 | HR 91 | Temp 98.2°F | Ht 67.0 in

## 2024-07-18 DIAGNOSIS — K219 Gastro-esophageal reflux disease without esophagitis: Secondary | ICD-10-CM

## 2024-07-18 DIAGNOSIS — K9089 Other intestinal malabsorption: Secondary | ICD-10-CM | POA: Diagnosis not present

## 2024-07-18 DIAGNOSIS — Z789 Other specified health status: Secondary | ICD-10-CM

## 2024-07-18 DIAGNOSIS — K760 Fatty (change of) liver, not elsewhere classified: Secondary | ICD-10-CM

## 2024-07-18 DIAGNOSIS — R232 Flushing: Secondary | ICD-10-CM | POA: Diagnosis not present

## 2024-07-18 DIAGNOSIS — G479 Sleep disorder, unspecified: Secondary | ICD-10-CM

## 2024-07-18 DIAGNOSIS — E249 Cushing's syndrome, unspecified: Secondary | ICD-10-CM

## 2024-07-18 DIAGNOSIS — R635 Abnormal weight gain: Secondary | ICD-10-CM | POA: Diagnosis not present

## 2024-07-18 DIAGNOSIS — R208 Other disturbances of skin sensation: Secondary | ICD-10-CM

## 2024-07-18 DIAGNOSIS — F3162 Bipolar disorder, current episode mixed, moderate: Secondary | ICD-10-CM

## 2024-07-18 DIAGNOSIS — R7989 Other specified abnormal findings of blood chemistry: Secondary | ICD-10-CM

## 2024-07-18 DIAGNOSIS — Z9049 Acquired absence of other specified parts of digestive tract: Secondary | ICD-10-CM

## 2024-07-18 DIAGNOSIS — M797 Fibromyalgia: Secondary | ICD-10-CM

## 2024-07-18 DIAGNOSIS — G894 Chronic pain syndrome: Secondary | ICD-10-CM

## 2024-07-18 DIAGNOSIS — R5382 Chronic fatigue, unspecified: Secondary | ICD-10-CM

## 2024-07-18 MED ORDER — CHOLESTYRAMINE 4 G PO PACK: 4.0000 g | PACK | Freq: Three times a day (TID) | ORAL | 12 refills | Status: AC

## 2024-07-18 NOTE — Progress Notes (Signed)
 ==============================  [facility]: [phone]   -- Medical Office Visit --  Patient: Gina [name]      Age: 48 y.o.       Sex:  female  Date:   07/18/2024 Today's Healthcare Provider: Bernardino KANDICE Cone, MD  ==============================   Chief Complaint: Obesity (Would like to discuss weight loss) and Pain (Pt has some pain reaction from medication taking went to ed for all of this so following up on this today as well.)   Discussed the use of AI scribe software for clinical note transcription with the patient, who gave verbal consent to proceed.  History of Present Illness The patient is a 48 year old female with a highly complex medical history, including bipolar disorder (currently managed with lurasidone ), fibromyalgia, chronic pain syndrome, and extensive prior surgeries (including bilateral shoulder arthroscopies, lumbar fusion, partial hysterectomy, and cholecystectomy). She presents for evaluation of severe, worsening pain, profound fatigue, and rapid, unexplained weight gain.  Pain and Neurologic Symptoms: Over the past several weeks, the patient has experienced a marked exacerbation of pain, described as exhausting and debilitating, primarily affecting her lower back and legs, but also involving previously injured areas such as her shoulders, neck, and feet. She reports both allodynia (pain from non-painful stimuli) and hyperalgesia (amplified pain from painful stimuli), with intermittent flares that have not responded adequately to typical pain management strategies. The onset of these symptoms coincided temporally with a recent increase in Wellbutrin  (bupropion ) dosage, which was subsequently discontinued due to suspected adverse drug reaction and possible small fiber neuropathy. Despite discontinuation, pain flares persist, though some improvement has been noted over the past two days. She is currently using Valium  and Percocet  as needed for pain, with variable relief.  Fatigue and Functional Decline: She describes profound fatigue, lack of energy, and difficulty performing daily activities. She is currently participating in a partial hospitalization program for intensive psychological therapy, which she finds physically demanding due to her exhaustion and pain. She denies overt cognitive impairment but feels her overall quality of life has significantly deteriorated.  Weight Gain and Metabolic Concerns: The patient reports a dramatic, unintentional weight gain of approximately 100 pounds over the past 15 months, with the total increase occurring over roughly two years. She adamantly denies overeating, noting minimal food intake due to chronic gastrointestinal symptoms. She has previously trialed weight loss interventions, including Ozempic, with no benefit and significant adverse effects (nausea, vomiting, malaise). She expresses frustration and distress regarding her inability to lose weight, describing associated swelling, bloating, and visceral adiposity. She is currently using topical testosterone for documented low serum testosterone (recent value: 25 ng/dL; reference: [id number]), with partial improvement in perimenopausal flushing but persistent metabolic symptoms.  Gastrointestinal Symptoms: Her GI history is notable for chronic, severe diarrhea and vomiting--often with yellow bile--occurring daily and exacerbated after cholecystectomy. She has been diagnosed with irritable bowel syndrome (IBS) but expresses skepticism about this diagnosis, noting that her symptoms began or worsened after gallbladder removal. She has been unable to eat during the day, especially while attending her therapy program, due to the severity of her symptoms. She currently manages cramping with Levsin  and nausea/vomiting with Phenergan  but remains significantly symptomatic. She has trialed various dietary modifications and medications with minimal  relief.  Hormonal and Endocrine Evaluation: She has a history of intermittent elevations in cortisol, though prior endocrine workup (including morning cortisol, ACTH , and 24-hour urine cortisol) was reportedly negative or equivocal. She has also had normal thyroid  function  tests. She is concerned about possible Cushings syndrome or other hormonal etiologies for her symptoms, particularly given her rapid weight gain, low testosterone, perimenopausal flushing, and persistent fatigue. She has not had consistently elevated cortisol on repeat testing but is scheduled for a comprehensive endocrine evaluation, including repeat cortisol, ACTH , 24-hour urine cortisol, salivary cortisol, and 5-HIAA for carcinoid syndrome (given episodic flushing).  Sleep and Psychiatric History: She has a longstanding diagnosis of bipolar disorder, currently stable on lurasidone  and escitalopram , with no recent manic or psychotic episodes. She has a history of severe depression, panic disorder, and anxiety, including prior psychiatric hospitalizations following medication changes. She has been evaluated for sleep apnea during her period of weight gain, with borderline results (7-8 episodes/night, below diagnostic threshold), but continues to experience significant daytime sleepiness. She denies current tobacco, alcohol, or illicit drug use.  Medication Intolerance and Allergies: Her allergy list is extensive, including multiple psychotropic and pain medications, with documented adverse reactions ranging from anaphylaxis (e.g., lamotrigine, Zofran ) to severe GI intolerance, skin reactions, and neuropsychiatric side effects. She is particularly sensitive to medication changes and expresses concern about trialing new agents.   Background Reviewed: Problem List: has Herpes simplex virus (HSV) infection; CONDYLOMA ACUMINATUM; Depression with anxiety; PANIC DISORDER WITHOUT AGORAPHOBIA; PANIC DISORDER WITH AGORAPHOBIA; DYSTHYMIC  DISORDER; TOBACCO ABUSE; ALLERGIC RHINITIS; LUNG NODULE; DENTAL PAIN; STRICTURE AND STENOSIS OF ESOPHAGUS; GERD; ABDOMINAL WALL HERNIA; CONSTIPATION; ENDOMETRIOSIS; LENTIGO; CERVICAL RADICULOPATHY; BACK PAIN, THORACIC REGION; Fatigue; PALPITATIONS; Musculoskeletal malfunction arising from mental factors; Abdominal pain; Heartburn; Chronic pain syndrome; Chronic pain; DYSPHAGIA; Incisional hernia; Other postprocedural status(V45.89); Disorder of sacrum; Reactive hypoglycemia; Sciatica; 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; Bipolar 1 disorder, mixed, moderate (HCC); Bipolar affective disorder, current episode mixed (HCC); Arthritis; Cigarette nicotine  dependence without complication; Bilateral temporomandibular joint pain; Cervical radiculopathy; Chronic migraine without aura, intractable, with status migrainosus; History of bipolar disorder; Lumbar spondylosis; Paresthesias; Piriformis syndrome of both sides; Referred otalgia, bilateral; S/P hernia surgery; Tardive dyskinesia; Urticaria due to drug allergy; Partial small bowel obstruction (HCC); Nausea and vomiting; SBO (small bowel obstruction) (HCC); Persistent headaches; Blurry vision; Weight gain; S/P arthroscopy of right shoulder; Calcific supraspinatus tendonitis; Bursitis of right shoulder; Arthritis of right acromioclavicular joint; Chronic pain in left shoulder; Arthritis of left acromioclavicular joint; Bipolar 1 disorder, depressed, severe (HCC); Cushing's syndrome (HCC); Bile salt-induced diarrhea; Status post cholecystectomy; Hepatic steatosis; Flushing; Allodynia; Low testosterone; Fibromyalgia; and Intolerance, drug on their problem list. Past Medical History:  has a past medical history of ABDOMINAL WALL HERNIA (02/27/2010), Abnormal Pap smear, ALLERGIC RHINITIS (06/02/2007), ANOREXIA, CHRONIC (08/07/2008), ANXIETY (09/15/2010), Arthritis, BACK PAIN, THORACIC REGION (07/07/2007), Bipolar disorder (HCC), CERVICAL RADICULOPATHY (12/11/2008),  Complication of anesthesia, Complication of anesthesia, Condyloma acuminatum (04/23/2009), CONSTIPATION (09/15/2010), DYSPHAGIA UNSPECIFIED (09/09/2009), Dysthymic disorder (06/20/2009), Eating disorder, ENDOMETRIOSIS (12/16/2009), FATIGUE (06/11/2010), Fibromyalgia, GERD (10/23/2009), Heart palpitations, HERPES SIMPLEX INFECTION (06/11/2010), LENTIGO (04/23/2009), LUNG NODULE (06/17/2010), Narcotic abuse, continuous (HCC), Ovarian cyst, Palpitations (10/23/2009), Panic disorder, Rheumatoid arthritis(714.0), Stricture and stenosis of esophagus (05/13/2010), TOBACCO ABUSE (08/07/2008), Urinary tract infection, and UTI (urinary tract infection). Past Surgical History:   has a past surgical history that includes Partial hysterectomy; Endometrial ablation; Hernia repair; Dilation and curettage of uterus; Cesarean section; esophageal dilatation x 4; Gum surgery; Abdominal surgery; Abdominal wall mesh  removal; Ablation on endometriosis; Shoulder surgery (Right, 09/2023); Cystoscopy (N/A, 11/22/2023); Posterior lumbar fusion 2 with hardware removal (Left, 03/29/2024); and Resection distal clavical (Left, 03/29/2024). Social History:   reports that she has quit smoking. Her smoking use included cigarettes. She has never  used smokeless tobacco. She reports that she does not drink alcohol and does not use drugs. Family History:  family history includes Arthritis in her mother; Cancer in her father; Depression in her mother; Diabetes in her father; Heart attack in her father; Heart disease in her father; Hyperlipidemia in her father; Hypertension in her father; Stroke in her father. Allergies:  is allergic to lamictal [lamotrigine], maxalt  [rizatriptan ], morphine  and codeine , nicotine , wellbutrin  [bupropion ], zofran , chantix  [varenicline  tartrate], ciprofloxacin , codeine , haldol  [haloperidol ], ibuprofen, lidoderm  [lidocaine ], skelaxin [metaxalone], sulfa antibiotics, sulfasalazine, toradol  [ketorolac  tromethamine ], ambien  [zolpidem], doxycycline , duloxetine, ingrezza [valbenazine tosylate], lyrica [pregabalin], methadone, neurontin  [gabapentin ], other, prednisone , amoxicillin, aripiprazole, chlorhexidine , penicillins, and tramadol .   Medication Reconciliation: Current Outpatient Medications on File Prior to Visit  Medication Sig   calcium carbonate (TUMS - DOSED IN MG ELEMENTAL CALCIUM) 500 MG chewable tablet Chew 1 tablet by mouth 4 (four) times daily as needed for indigestion or heartburn.   Cholecalciferol (VITAMIN D3 PO) Take 2 each by mouth daily. Gummy   diazepam  (VALIUM ) 5 MG tablet Take 1 tablet (5 mg total) by mouth 2 (two) times daily as needed for muscle spasms.   EC-RX Testosterone 0.2 % CREA Place onto the skin.   escitalopram  (LEXAPRO ) 10 MG tablet Take 10 mg by mouth daily.   hyoscyamine  (LEVSIN  SL) 0.125 MG SL tablet Place 0.125 mg under the tongue every 4 (four) hours as needed for cramping.   LORazepam  (ATIVAN ) 1 MG tablet Take 1 mg by mouth every 8 (eight) hours as needed for anxiety.   lurasidone  (LATUDA ) 40 MG TABS tablet Take 40 mg by mouth every evening.   methocarbamol  (ROBAXIN ) 500 MG tablet TAKE 1 TABLET BY MOUTH EVERY 8 HOURS AS NEEDED FOR UP TO 7 DAYS   oxyCODONE -acetaminophen  (PERCOCET/ROXICET) 5-325 MG tablet Take 1 tablet by mouth every 4 (four) hours as needed for severe pain (pain score 7-10).   Oyster Shell (OYSTER CALCIUM) 500 MG TABS tablet Take 500 mg of elemental calcium by mouth daily.   PRESCRIPTION MEDICATION Apply 1-2 Applications topically at bedtime. Testosterone 4% cream   promethazine  (PHENERGAN ) 25 MG tablet Take 25 mg by mouth every 6 (six) hours as needed for nausea or vomiting.   Suzetrigine  (JOURNAVX ) 50 MG TABS Take 1 tablet by mouth 2 (two) times daily as needed.   No current facility-administered medications on file prior to visit.  There are no discontinued medications.   Physical Exam:    07/18/2024    4:33 PM 07/12/2024    9:40 PM 07/12/2024    9:30 PM   Vitals with BMI  Height 5' 7    Weight --    Systolic 100 97 97  Diastolic 60 79 79  Pulse 91 69 69  Vital signs reviewed.  Nursing notes reviewed. Weight trend reviewed. Physical Activity: Inactive (04/30/2024)   Exercise Vital Sign    Days of Exercise per Week: 0 days    Minutes of Exercise per Session: 0 min   General: Well-groomed, healthy-appearing female, no acute distress. Skin: Notable for bilateral foot swelling and mild dorsal cervical fat pad (buffalo hump). Musculoskeletal: All extremities intact. Neurologic: Alert, oriented, no focal deficits. Psychiatric: Pleasant, cooperative, appropriate mood.   Verbalized to patient: Physical Exam SKIN: Feet swelling present. Mild dorsal cervical fat pad noted.  Vitals: BP low-normal, pulse mildly elevated; BMI not documented but estimated >35. Recent Labs and Imaging:  Hormones: Estrogen 100 pg/mL (normal), testosterone 25 ng/dL (low). Thyroid : TSH 1.78 (normal). CBC/CMP: Generally normal, mild  hypoalbuminemia and hypoproteinemia noted intermittently; no evidence of infection, renal, or hepatic dysfunction. Imaging: CT abdomen/pelvis (05/2024) unremarkable except for hepatic steatosis; no acute intra-abdominal pathology. CTA head/neck (07/2024) negative for vascular or intracranial abnormality. Venous Doppler and MRI foot: No evidence of DVT or musculoskeletal abnormality. Sleep Study: Borderline abnormal, not diagnostic for sleep apnea. Pending labs: Repeat cortisol, ACTH , salivary cortisol, 24-hour urine cortisol, 5-HIAA, metabolic panels, and urinalysis.    07/16/2024    1:08 PM 04/30/2024   11:22 AM 12/06/2019    1:07 PM 05/25/2019    4:33 PM  PHQ 2/9 Scores  PHQ - 2 Score  0 0 1  PHQ- 9 Score  9       Information is confidential and restricted. Go to Review Flowsheets to unlock data.   Admission on 07/12/2024, Discharged on 07/12/2024  Component Date Value Ref Range Status   Glucose-Capillary 07/12/2024 78  70 - 99  mg/dL Final   WBC 90/88/7974 8.2  4.0 - 10.5 K/uL Final   RBC 07/12/2024 4.48  3.87 - 5.11 MIL/uL Final   Hemoglobin 07/12/2024 13.9  12.0 - 15.0 g/dL Final   HCT 90/88/7974 42.2  36.0 - 46.0 % Final   MCV 07/12/2024 94.2  80.0 - 100.0 fL Final   MCH 07/12/2024 31.0  26.0 - 34.0 pg Final   MCHC 07/12/2024 32.9  30.0 - 36.0 g/dL Final   RDW 90/88/7974 11.9  11.5 - 15.5 % Final   Platelets 07/12/2024 267  150 - 400 K/uL Final   nRBC 07/12/2024 0.0  0.0 - 0.2 % Final   Neutrophils Relative % 07/12/2024 53  % Final   Neutro Abs 07/12/2024 4.4  1.7 - 7.7 K/uL Final   Lymphocytes Relative 07/12/2024 34  % Final   Lymphs Abs 07/12/2024 2.8  0.7 - 4.0 K/uL Final   Monocytes Relative 07/12/2024 10  % Final   Monocytes Absolute 07/12/2024 0.8  0.1 - 1.0 K/uL Final   Eosinophils Relative 07/12/2024 2  % Final   Eosinophils Absolute 07/12/2024 0.2  0.0 - 0.5 K/uL Final   Basophils Relative 07/12/2024 1  % Final   Basophils Absolute 07/12/2024 0.0  0.0 - 0.1 K/uL Final   Immature Granulocytes 07/12/2024 0  % Final   Abs Immature Granulocytes 07/12/2024 0.02  0.00 - 0.07 K/uL Final   Sodium 07/12/2024 139  135 - 145 mmol/L Final   Potassium 07/12/2024 3.9  3.5 - 5.1 mmol/L Final   Chloride 07/12/2024 105  98 - 111 mmol/L Final   CO2 07/12/2024 23  22 - 32 mmol/L Final   Glucose, Bld 07/12/2024 85  70 - 99 mg/dL Final   BUN 90/88/7974 9  6 - 20 mg/dL Final   Creatinine, Ser 07/12/2024 0.66  0.44 - 1.00 mg/dL Final   Calcium 90/88/7974 8.9  8.9 - 10.3 mg/dL Final   Total Protein 90/88/7974 6.2 (L)  6.5 - 8.1 g/dL Final   Albumin 90/88/7974 4.0  3.5 - 5.0 g/dL Final   AST 90/88/7974 17  15 - 41 U/L Final   ALT 07/12/2024 9  0 - 44 U/L Final   Alkaline Phosphatase 07/12/2024 120  38 - 126 U/L Final   Total Bilirubin 07/12/2024 <0.2  0.0 - 1.2 mg/dL Final   GFR, Estimated 07/12/2024 >60  >60 mL/min Final   Anion gap 07/12/2024 10  5 - 15 Final   Color, Urine 07/12/2024 YELLOW  YELLOW Final    APPearance 07/12/2024 CLEAR  CLEAR Final  Specific Gravity, Urine 07/12/2024 1.005  1.005 - 1.030 Final   pH 07/12/2024 7.0  5.0 - 8.0 Final   Glucose, UA 07/12/2024 NEGATIVE  NEGATIVE mg/dL Final   Hgb urine dipstick 07/12/2024 NEGATIVE  NEGATIVE Final   Bilirubin Urine 07/12/2024 NEGATIVE  NEGATIVE Final   Ketones, ur 07/12/2024 NEGATIVE  NEGATIVE mg/dL Final   Protein, ur 90/88/7974 NEGATIVE  NEGATIVE mg/dL Final   Nitrite 90/88/7974 NEGATIVE  NEGATIVE Final   Leukocytes,Ua 07/12/2024 NEGATIVE  NEGATIVE Final  Admission on 07/09/2024, Discharged on 07/10/2024  Component Date Value Ref Range Status   WBC 07/09/2024 9.5  4.0 - 10.5 K/uL Final   RBC 07/09/2024 4.55  3.87 - 5.11 MIL/uL Final   Hemoglobin 07/09/2024 14.4  12.0 - 15.0 g/dL Final   HCT 90/91/7974 41.3  36.0 - 46.0 % Final   MCV 07/09/2024 90.8  80.0 - 100.0 fL Final   MCH 07/09/2024 31.6  26.0 - 34.0 pg Final   MCHC 07/09/2024 34.9  30.0 - 36.0 g/dL Final   RDW 90/91/7974 11.9  11.5 - 15.5 % Final   Platelets 07/09/2024 291  150 - 400 K/uL Final   nRBC 07/09/2024 0.0  0.0 - 0.2 % Final   Sodium 07/09/2024 136  135 - 145 mmol/L Final   Potassium 07/09/2024 4.0  3.5 - 5.1 mmol/L Final   Chloride 07/09/2024 102  98 - 111 mmol/L Final   CO2 07/09/2024 21 (L)  22 - 32 mmol/L Final   Glucose, Bld 07/09/2024 91  70 - 99 mg/dL Final   BUN 90/91/7974 11  6 - 20 mg/dL Final   Creatinine, Ser 07/09/2024 0.71  0.44 - 1.00 mg/dL Final   Calcium 90/91/7974 9.8  8.9 - 10.3 mg/dL Final   GFR, Estimated 07/09/2024 >60  >60 mL/min Final   Anion gap 07/09/2024 14  5 - 15 Final  Appointment on 05/23/2024  Component Date Value Ref Range Status   Vitamin B-12 05/23/2024 574  211 - 911 pg/mL Final   Vit D, 25-Hydroxy 05/23/2024 36  30 - 100 ng/mL Final  Admission on 05/21/2024, Discharged on 05/22/2024  Component Date Value Ref Range Status   Lipase 05/21/2024 28  11 - 51 U/L Final   Sodium 05/21/2024 135  135 - 145 mmol/L Final    Potassium 05/21/2024 3.7  3.5 - 5.1 mmol/L Final   Chloride 05/21/2024 102  98 - 111 mmol/L Final   CO2 05/21/2024 23  22 - 32 mmol/L Final   Glucose, Bld 05/21/2024 96  70 - 99 mg/dL Final   BUN 92/78/7974 10  6 - 20 mg/dL Final   Creatinine, Ser 05/21/2024 0.69  0.44 - 1.00 mg/dL Final   Calcium 92/78/7974 9.5  8.9 - 10.3 mg/dL Final   Total Protein 92/78/7974 7.2  6.5 - 8.1 g/dL Final   Albumin 92/78/7974 4.1  3.5 - 5.0 g/dL Final   AST 92/78/7974 18  15 - 41 U/L Final   ALT 05/21/2024 17  0 - 44 U/L Final   Alkaline Phosphatase 05/21/2024 111  38 - 126 U/L Final   Total Bilirubin 05/21/2024 0.7  0.0 - 1.2 mg/dL Final   GFR, Estimated 05/21/2024 >60  >60 mL/min Final   Anion gap 05/21/2024 10  5 - 15 Final   WBC 05/21/2024 9.7  4.0 - 10.5 K/uL Final   RBC 05/21/2024 4.81  3.87 - 5.11 MIL/uL Final   Hemoglobin 05/21/2024 14.9  12.0 - 15.0 g/dL Final   HCT 92/78/7974  45.4  36.0 - 46.0 % Final   MCV 05/21/2024 94.4  80.0 - 100.0 fL Final   MCH 05/21/2024 31.0  26.0 - 34.0 pg Final   MCHC 05/21/2024 32.8  30.0 - 36.0 g/dL Final   RDW 92/78/7974 11.9  11.5 - 15.5 % Final   Platelets 05/21/2024 271  150 - 400 K/uL Final   nRBC 05/21/2024 0.0  0.0 - 0.2 % Final   Color, Urine 05/22/2024 YELLOW  YELLOW Final   APPearance 05/22/2024 CLEAR  CLEAR Final   Specific Gravity, Urine 05/22/2024 1.028  1.005 - 1.030 Final   pH 05/22/2024 6.0  5.0 - 8.0 Final   Glucose, UA 05/22/2024 NEGATIVE  NEGATIVE mg/dL Final   Hgb urine dipstick 05/22/2024 NEGATIVE  NEGATIVE Final   Bilirubin Urine 05/22/2024 NEGATIVE  NEGATIVE Final   Ketones, ur 05/22/2024 NEGATIVE  NEGATIVE mg/dL Final   Protein, ur 92/77/7974 NEGATIVE  NEGATIVE mg/dL Final   Nitrite 92/77/7974 NEGATIVE  NEGATIVE Final   Leukocytes,Ua 05/22/2024 NEGATIVE  NEGATIVE Final  Admission on 05/16/2024, Discharged on 05/16/2024  Component Date Value Ref Range Status   Lipase 05/16/2024 20  11 - 51 U/L Final   Sodium 05/16/2024 136  135 - 145  mmol/L Final   Potassium 05/16/2024 4.5  3.5 - 5.1 mmol/L Final   Chloride 05/16/2024 102  98 - 111 mmol/L Final   CO2 05/16/2024 19 (L)  22 - 32 mmol/L Final   Glucose, Bld 05/16/2024 106 (H)  70 - 99 mg/dL Final   BUN 92/83/7974 9  6 - 20 mg/dL Final   Creatinine, Ser 05/16/2024 0.79  0.44 - 1.00 mg/dL Final   Calcium 92/83/7974 9.4  8.9 - 10.3 mg/dL Final   Total Protein 92/83/7974 7.1  6.5 - 8.1 g/dL Final   Albumin 92/83/7974 4.4  3.5 - 5.0 g/dL Final   AST 92/83/7974 25  15 - 41 U/L Final   ALT 05/16/2024 15  0 - 44 U/L Final   Alkaline Phosphatase 05/16/2024 129 (H)  38 - 126 U/L Final   Total Bilirubin 05/16/2024 0.4  0.0 - 1.2 mg/dL Final   GFR, Estimated 05/16/2024 >60  >60 mL/min Final   Anion gap 05/16/2024 14  5 - 15 Final   WBC 05/16/2024 8.4  4.0 - 10.5 K/uL Final   RBC 05/16/2024 4.70  3.87 - 5.11 MIL/uL Final   Hemoglobin 05/16/2024 15.1 (H)  12.0 - 15.0 g/dL Final   HCT 92/83/7974 43.6  36.0 - 46.0 % Final   MCV 05/16/2024 92.8  80.0 - 100.0 fL Final   MCH 05/16/2024 32.1  26.0 - 34.0 pg Final   MCHC 05/16/2024 34.6  30.0 - 36.0 g/dL Final   RDW 92/83/7974 12.1  11.5 - 15.5 % Final   Platelets 05/16/2024 278  150 - 400 K/uL Final   nRBC 05/16/2024 0.0  0.0 - 0.2 % Final  Admission on 05/02/2024, Discharged on 05/02/2024  Component Date Value Ref Range Status   WBC 05/02/2024 8.7  4.0 - 10.5 K/uL Final   RBC 05/02/2024 4.58  3.87 - 5.11 MIL/uL Final   Hemoglobin 05/02/2024 14.7  12.0 - 15.0 g/dL Final   HCT 92/97/7974 42.5  36.0 - 46.0 % Final   MCV 05/02/2024 92.8  80.0 - 100.0 fL Final   MCH 05/02/2024 32.1  26.0 - 34.0 pg Final   MCHC 05/02/2024 34.6  30.0 - 36.0 g/dL Final   RDW 92/97/7974 12.5  11.5 - 15.5 % Final  Platelets 05/02/2024 266  150 - 400 K/uL Final   nRBC 05/02/2024 0.0  0.0 - 0.2 % Final   Color, Urine 05/02/2024 YELLOW (A)  YELLOW Final   APPearance 05/02/2024 CLEAR  CLEAR Final   Specific Gravity, Urine 05/02/2024 1.018  1.005 - 1.030  Final   pH 05/02/2024 6.0  5.0 - 8.0 Final   Glucose, UA 05/02/2024 NEGATIVE  NEGATIVE mg/dL Final   Hgb urine dipstick 05/02/2024 NEGATIVE  NEGATIVE Final   Bilirubin Urine 05/02/2024 NEGATIVE  NEGATIVE Final   Ketones, ur 05/02/2024 NEGATIVE  NEGATIVE mg/dL Final   Protein, ur 92/97/7974 NEGATIVE  NEGATIVE mg/dL Final   Nitrite 92/97/7974 NEGATIVE  NEGATIVE Final   Leukocytes,Ua 05/02/2024 NEGATIVE  NEGATIVE Final   Sodium 05/02/2024 137  135 - 145 mmol/L Final   Potassium 05/02/2024 3.8  3.5 - 5.1 mmol/L Final   Chloride 05/02/2024 105  98 - 111 mmol/L Final   CO2 05/02/2024 21 (L)  22 - 32 mmol/L Final   Glucose, Bld 05/02/2024 94  70 - 99 mg/dL Final   BUN 92/97/7974 10  6 - 20 mg/dL Final   Creatinine, Ser 05/02/2024 0.73  0.44 - 1.00 mg/dL Final   Calcium 92/97/7974 9.2  8.9 - 10.3 mg/dL Final   Total Protein 92/97/7974 6.6  6.5 - 8.1 g/dL Final   Albumin 92/97/7974 4.0  3.5 - 5.0 g/dL Final   AST 92/97/7974 16  15 - 41 U/L Final   ALT 05/02/2024 13  0 - 44 U/L Final   Alkaline Phosphatase 05/02/2024 121  38 - 126 U/L Final   Total Bilirubin 05/02/2024 0.5  0.0 - 1.2 mg/dL Final   GFR, Estimated 05/02/2024 >60  >60 mL/min Final   Anion gap 05/02/2024 11  5 - 15 Final   Lipase 05/02/2024 16  11 - 51 U/L Final  Scanned Document on 05/01/2024  Component Date Value Ref Range Status   HM Pap smear 03/02/2022 nilm   Final  Hospital Outpatient Visit on 03/27/2024  Component Date Value Ref Range Status   WBC 03/27/2024 8.9  4.0 - 10.5 K/uL Final   RBC 03/27/2024 4.45  3.87 - 5.11 MIL/uL Final   Hemoglobin 03/27/2024 14.6  12.0 - 15.0 g/dL Final   HCT 94/72/7974 42.3  36.0 - 46.0 % Final   MCV 03/27/2024 95.1  80.0 - 100.0 fL Final   MCH 03/27/2024 32.8  26.0 - 34.0 pg Final   MCHC 03/27/2024 34.5  30.0 - 36.0 g/dL Final   RDW 94/72/7974 12.4  11.5 - 15.5 % Final   Platelets 03/27/2024 241  150 - 400 K/uL Final   nRBC 03/27/2024 0.0  0.0 - 0.2 % Final   Sodium 03/27/2024 137   135 - 145 mmol/L Final   Potassium 03/27/2024 4.6  3.5 - 5.1 mmol/L Final   Chloride 03/27/2024 105  98 - 111 mmol/L Final   CO2 03/27/2024 26  22 - 32 mmol/L Final   Glucose, Bld 03/27/2024 90  70 - 99 mg/dL Final   BUN 94/72/7974 11  6 - 20 mg/dL Final   Creatinine, Ser 03/27/2024 0.64  0.44 - 1.00 mg/dL Final   Calcium 94/72/7974 8.6 (L)  8.9 - 10.3 mg/dL Final   GFR, Estimated 03/27/2024 >60  >60 mL/min Final   Anion gap 03/27/2024 6  5 - 15 Final  Admission on 03/25/2024, Discharged on 03/25/2024  Component Date Value Ref Range Status   Glucose-Capillary 03/25/2024 86  70 - 99 mg/dL  Final   Sodium 03/25/2024 139  135 - 145 mmol/L Final   Potassium 03/25/2024 4.3  3.5 - 5.1 mmol/L Final   Chloride 03/25/2024 103  98 - 111 mmol/L Final   BUN 03/25/2024 8  6 - 20 mg/dL Final   Creatinine, Ser 03/25/2024 0.60  0.44 - 1.00 mg/dL Final   Glucose, Bld 94/74/7974 82  70 - 99 mg/dL Final   Calcium, Ion 94/74/7974 1.13 (L)  1.15 - 1.40 mmol/L Final   TCO2 03/25/2024 23  22 - 32 mmol/L Final   Hemoglobin 03/25/2024 15.3 (H)  12.0 - 15.0 g/dL Final   HCT 94/74/7974 45.0  36.0 - 46.0 % Final   WBC 03/25/2024 7.3  4.0 - 10.5 K/uL Final   RBC 03/25/2024 4.71  3.87 - 5.11 MIL/uL Final   Hemoglobin 03/25/2024 15.0  12.0 - 15.0 g/dL Final   HCT 94/74/7974 44.3  36.0 - 46.0 % Final   MCV 03/25/2024 94.1  80.0 - 100.0 fL Final   MCH 03/25/2024 31.8  26.0 - 34.0 pg Final   MCHC 03/25/2024 33.9  30.0 - 36.0 g/dL Final   RDW 94/74/7974 12.2  11.5 - 15.5 % Final   Platelets 03/25/2024 251  150 - 400 K/uL Final   nRBC 03/25/2024 0.0  0.0 - 0.2 % Final   Neutrophils Relative % 03/25/2024 55  % Final   Neutro Abs 03/25/2024 3.9  1.7 - 7.7 K/uL Final   Lymphocytes Relative 03/25/2024 35  % Final   Lymphs Abs 03/25/2024 2.6  0.7 - 4.0 K/uL Final   Monocytes Relative 03/25/2024 8  % Final   Monocytes Absolute 03/25/2024 0.6  0.1 - 1.0 K/uL Final   Eosinophils Relative 03/25/2024 2  % Final    Eosinophils Absolute 03/25/2024 0.1  0.0 - 0.5 K/uL Final   Basophils Relative 03/25/2024 0  % Final   Basophils Absolute 03/25/2024 0.0  0.0 - 0.1 K/uL Final   Immature Granulocytes 03/25/2024 0  % Final   Abs Immature Granulocytes 03/25/2024 0.03  0.00 - 0.07 K/uL Final   Sodium 03/25/2024 138  135 - 145 mmol/L Final   Potassium 03/25/2024 4.3  3.5 - 5.1 mmol/L Final   Chloride 03/25/2024 107  98 - 111 mmol/L Final   CO2 03/25/2024 25  22 - 32 mmol/L Final   Glucose, Bld 03/25/2024 89  70 - 99 mg/dL Final   BUN 94/74/7974 7  6 - 20 mg/dL Final   Creatinine, Ser 03/25/2024 0.70  0.44 - 1.00 mg/dL Final   Calcium 94/74/7974 8.7 (L)  8.9 - 10.3 mg/dL Final   Total Protein 94/74/7974 6.6  6.5 - 8.1 g/dL Final   Albumin 94/74/7974 3.6  3.5 - 5.0 g/dL Final   AST 94/74/7974 17  15 - 41 U/L Final   ALT 03/25/2024 18  0 - 44 U/L Final   Alkaline Phosphatase 03/25/2024 101  38 - 126 U/L Final   Total Bilirubin 03/25/2024 0.5  0.0 - 1.2 mg/dL Final   GFR, Estimated 03/25/2024 >60  >60 mL/min Final   Anion gap 03/25/2024 6  5 - 15 Final  Hospital Outpatient Visit on 01/16/2024  Component Date Value Ref Range Status   WBC 01/16/2024 6.2  4.0 - 10.5 K/uL Final   RBC 01/16/2024 4.90  3.87 - 5.11 MIL/uL Final   Hemoglobin 01/16/2024 15.6 (H)  12.0 - 15.0 g/dL Final   HCT 96/82/7974 46.4 (H)  36.0 - 46.0 % Final   MCV 01/16/2024 94.7  80.0 - 100.0 fL Final   MCH 01/16/2024 31.8  26.0 - 34.0 pg Final   MCHC 01/16/2024 33.6  30.0 - 36.0 g/dL Final   RDW 96/82/7974 12.4  11.5 - 15.5 % Final   Platelets 01/16/2024 226  150 - 400 K/uL Final   nRBC 01/16/2024 0.0  0.0 - 0.2 % Final   Sodium 01/16/2024 136  135 - 145 mmol/L Final   Potassium 01/16/2024 4.3  3.5 - 5.1 mmol/L Final   Chloride 01/16/2024 103  98 - 111 mmol/L Final   CO2 01/16/2024 24  22 - 32 mmol/L Final   Glucose, Bld 01/16/2024 94  70 - 99 mg/dL Final   BUN 96/82/7974 7  6 - 20 mg/dL Final   Creatinine, Ser 01/16/2024 0.66  0.44 -  1.00 mg/dL Final   Calcium 96/82/7974 8.7 (L)  8.9 - 10.3 mg/dL Final   GFR, Estimated 01/16/2024 >60  >60 mL/min Final   Anion gap 01/16/2024 9  5 - 15 Final  There may be more visits with results that are not included.  No image results found. CT ANGIO HEAD NECK W WO CM Result Date: 07/09/2024 EXAM: CTA HEAD AND NECK WITHOUT AND WITH 07/09/2024 10:01:55 PM TECHNIQUE: CTA of the head and neck was performed with and without the administration of intravenous contrast. Multiplanar 2D and/or 3D reformatted images are provided for review. Automated exposure control, iterative reconstruction, and/or weight based adjustment of the mA/kV was utilized to reduce the radiation dose to as low as reasonably achievable. Stenosis of the internal carotid arteries measured using NASCET criteria. COMPARISON: MRI head 03/25/2024 and CTA head and neck 03/25/2024 CLINICAL HISTORY: Syncope/presyncope, cerebrovascular cause suspected. Patient reports feeling like their head is on a roller coaster ride. FINDINGS: CTA NECK: AORTIC ARCH AND ARCH VESSELS: No dissection or arterial injury. No significant stenosis of the brachiocephalic or subclavian arteries. CERVICAL CAROTID ARTERIES: No dissection, arterial injury, or hemodynamically significant stenosis by NASCET criteria. CERVICAL VERTEBRAL ARTERIES: No dissection, arterial injury, or significant stenosis. LUNGS AND MEDIASTINUM: Unremarkable. SOFT TISSUES: No acute abnormality. BONES: No acute abnormality. CTA HEAD: ANTERIOR CIRCULATION: No significant stenosis of the internal carotid arteries. No significant stenosis of the anterior cerebral arteries. No significant stenosis of the middle cerebral arteries. No aneurysm. POSTERIOR CIRCULATION: No significant stenosis of the posterior cerebral arteries. No significant stenosis of the basilar artery. No significant stenosis of the vertebral arteries. No aneurysm. OTHER: No acute intracranial hemorrhage. Grey-white matter  differentiation is maintained. No edema, mass effect, or midline shift. The basilar cisterns are patent. Ventricles are unremarkable. Orbits are symmetric. Calvarium is intact. Mastoid air cells are clear. Paranasal sinuses are clear. No dural venous sinus thrombosis on this non-dedicated study. IMPRESSION: 1. No acute intracranial findings. 2. No large vessel occlusion, hemodynamically significant stenosis, or aneurysm in the head or neck. Electronically signed by: Donnice Mania MD 07/09/2024 10:25 PM EDT RP Workstation: [id number]   DG Foot Complete Left Result Date: 06/23/2024 CLINICAL DATA:  Left foot pain EXAM: LEFT FOOT - COMPLETE 3+ VIEW COMPARISON:  01/23/2024 FINDINGS: There is no evidence of fracture or dislocation. There is no evidence of arthropathy or other focal bone abnormality. Soft tissues are unremarkable. IMPRESSION: No acute abnormality noted. Electronically Signed   By: Oneil Devonshire M.D.   On: 06/23/2024 02:01   VAS US  LOWER EXTREMITY VENOUS (DVT) Result Date: 06/14/2024  Lower Venous DVT Study Patient Name:  Kayle Passarelli  Date of Exam:   06/14/2024 Medical Rec #:  [id number]            Accession #:    [id number] Date of Birth: Jul 21, 1976            Patient Gender: F Patient Age:   63 years Exam Location:  [address] Procedure:      VAS US  LOWER EXTREMITY VENOUS (DVT) Referring Phys: CARLIN CALIX --------------------------------------------------------------------------------  Indications: Pain, and Edema.left lower extremity  Performing Technologist: Devere Dark RVT  Examination Guidelines: A complete evaluation includes B-mode imaging, spectral Doppler, color Doppler, and power Doppler as needed of all accessible portions of each vessel. Bilateral testing is considered an integral part of a complete examination. Limited examinations for reoccurring indications may be performed as noted. The reflux portion of the exam is performed with the patient in reverse Trendelenburg.   +-----+---------------+---------+-----------+----------+--------------+ RIGHTCompressibilityPhasicitySpontaneityPropertiesThrombus Aging +-----+---------------+---------+-----------+----------+--------------+ CFV  Full           Yes      Yes                                 +-----+---------------+---------+-----------+----------+--------------+   +---------+---------------+---------+-----------+----------+--------------+ LEFT     CompressibilityPhasicitySpontaneityPropertiesThrombus Aging +---------+---------------+---------+-----------+----------+--------------+ CFV      Full           Yes      Yes                                 +---------+---------------+---------+-----------+----------+--------------+ SFJ      Full           Yes      Yes                                 +---------+---------------+---------+-----------+----------+--------------+ FV Prox  Full           Yes      Yes                                 +---------+---------------+---------+-----------+----------+--------------+ FV Mid   Full           Yes      Yes                                 +---------+---------------+---------+-----------+----------+--------------+ FV DistalFull           Yes      Yes                                 +---------+---------------+---------+-----------+----------+--------------+ PFV      Full           Yes      Yes                                 +---------+---------------+---------+-----------+----------+--------------+ POP      Full           Yes      Yes                                 +---------+---------------+---------+-----------+----------+--------------+ PTV  Full           Yes      Yes                                 +---------+---------------+---------+-----------+----------+--------------+ PERO     Full           Yes      Yes                                  +---------+---------------+---------+-----------+----------+--------------+ Gastroc  Full           Yes      Yes                                 +---------+---------------+---------+-----------+----------+--------------+ GSV      Full           Yes      Yes                                 +---------+---------------+---------+-----------+----------+--------------+ SSV      Full           Yes      Yes                                 +---------+---------------+---------+-----------+----------+--------------+   Findings reported to [facility].  Summary: LEFT: - There is no evidence of deep vein thrombosis in the lower extremity. - There is no evidence of superficial venous thrombosis. - There is no evidence of deep vein thrombosis proximal to the inguinal ligament or in the common femoral vein. - No Bakers cyst visualized.  *See table(s) above for measurements and observations. Electronically signed by Fonda Rim on 06/14/2024 at 5:26:35 PM.    Final    MR FOOT LEFT WO CONTRAST Result Date: 06/14/2024 MR FOOT WITHOUT IV CONTRAST LEFT COMPARISON: X-ray 11 2025 CLINICAL HISTORY: Foot pain PULSE SEQUENCES: Ax T1, Ax T2 FS, Sag T1, Sag T2 FS, Cor STIR, Cor T1 without contrast. FINDINGS: Bones: There is no fracture or marrow replacing process. No accelerated arthrosis is present. No significant subcutaneous edema or drainable collection is identified. Musculotendinous structures: There is no significant tenosynovitis or tendinosis. No myositis is identified in the intrinsic muscles of the foot. No drainable collection. IMPRESSION: Unremarkable MRI of the left foot. No acute abnormality. No accelerated arthrosis. Electronically signed by: Norleen Satchel MD 06/14/2024 12:47 PM EDT RP Workstation: [id number]   DG Foot Complete Right Result Date: 05/30/2024 CLINICAL DATA:  Pain for a week. EXAM: RIGHT FOOT COMPLETE - 3+ VIEW COMPARISON:  None Available. FINDINGS: There is no evidence of fracture or  dislocation. There is no evidence of arthropathy or other focal bone abnormality. Soft tissues are unremarkable. IMPRESSION: Negative. Electronically Signed   By: Fonda Field M.D.   On: 05/30/2024 08:42   CT ABDOMEN PELVIS W CONTRAST Result Date: 05/21/2024 CLINICAL DATA:  Abdomen pain diarrhea EXAM: CT ABDOMEN AND PELVIS WITH CONTRAST TECHNIQUE: Multidetector CT imaging of the abdomen and pelvis was performed using the standard protocol following bolus administration of intravenous contrast. RADIATION DOSE REDUCTION: This exam was performed according to the departmental dose-optimization program which includes automated exposure control, adjustment  of the mA and/or kV according to patient size and/or use of iterative reconstruction technique. CONTRAST:  OMNIPAQUE  IOHEXOL  300 MG/ML  SOLN COMPARISON:  CT 12/09/2023 FINDINGS: Lower chest: Lung bases demonstrate no acute airspace disease. Hepatobiliary: Hepatic steatosis. Cholecystectomy. No biliary dilatation Pancreas: Unremarkable. No pancreatic ductal dilatation or surrounding inflammatory changes. Spleen: Normal in size without focal abnormality. Kidneys show no hydronephrosis.  The bladder is normal Stomach/Bowel: Stomach is within normal limits. Appendix appears normal. No evidence of bowel wall thickening, distention, or inflammatory changes. Vascular/Lymphatic: No significant vascular findings are present. No enlarged abdominal or pelvic lymph nodes. Reproductive: Status post hysterectomy. No adnexal masses. Other: No abdominal wall hernia or abnormality. No abdominopelvic ascites. Musculoskeletal: No acute or significant osseous findings. IMPRESSION: 1. No CT evidence for acute intra-abdominal or pelvic abnormality. 2. Hepatic steatosis. Electronically Signed   By: [name] Bun M.D.   On: 05/21/2024 23:58   DG Abd 2 Views Result Date: 05/02/2024 CLINICAL DATA:  Near syncope. EXAM: ABDOMEN - 2 VIEW COMPARISON:  January 10, 2024 FINDINGS: The  bowel gas pattern is normal. There is no evidence of free air. Radiopaque surgical clips are seen overlying the right upper quadrant. No radio-opaque calculi or other significant radiographic abnormality is seen. IMPRESSION: Negative. Electronically Signed   By: Suzen Dials M.D.   On: 05/02/2024 20:00         ASSESSMENT & PLAN  Summary of Key Concerns: Suspected Cushing's syndrome (despite prior negative workup), with high suspicion for cyclic or evolving disease given clinical features: rapid weight gain, central adiposity, fatigue, skin changes, low testosterone, flushing, and mood disturbance. Severe unintentional weight gain with low caloric intake and minimal response to standard interventions. Chronic fatigue and pain with features of small fiber neuropathy, allodynia, hyperalgesia, and fibromyalgia. Bile acid diarrhea post-cholecystectomy, with daily yellow bile vomiting and diarrhea, likely due to bile salt malabsorption. Complex psychiatric and medication history, with high sensitivity to drug side effects and extensive allergy list. Ongoing diagnostic uncertainty, with need for comprehensive endocrine and metabolic evaluation.  Current Plan: Repeat full Cushings workup (cortisol, ACTH , 24-hour urine, salivary cortisol, 5-HIAA). Prescribe cholestyramine  powder for bile acid diarrhea. Continue current pain and psychiatric medications; avoid Wellbutrin  and other known offenders. Refer to endocrinology for definitive hormonal assessment and management. Monitor for sleep apnea if fatigue persists; consider repeat sleep study. Continue supportive care and close follow-up, with multidisciplinary coordination as needed. Patient and family actively participated in review and planning of care. All recommendations, risks, and follow-up instructions discussed and understood. Assessment & Plan Weight gain Severe Unintentional Weight Gain (R63.5, E66.9) Assessment: ~100 lbs weight gain  over 15 months with low caloric intake, minimal response to GLP-1 agonists, and no evidence of hypothyroidism. Plan: Continue metabolic/hormonal evaluation as above. Nutrition consult for assessment of metabolic rate and dietary counseling. Monitor for complications of obesity (diabetes, hypertension, sleep apnea, fatty liver, etc.). Consider referral to bariatric medicine if endocrine causes are excluded.  She gained approximately 100 pounds over the last 15 months despite low caloric intake. Previous thyroid  and hormone evaluations were normal except for low testosterone. High cortisol levels suspected to contribute to weight gain. GLP-1 agonists not suitable due to lack of appetite issues. Further evaluate metabolic rate and cortisol levels. Order morning cortisol level and full hormone panel. Refer to endocrinologist for further evaluation. Cushing's syndrome (HCC) Assessment: High clinical suspicion for Cushings syndrome given rapid, severe, unintentional weight gain, central adiposity, fatigue, skin changes (dorsal cervical fat pad, edema), low  testosterone, flushing, and mood disturbance. Previous workup reportedly negative, but cyclic/evolving disease remains possible. Differential includes adrenal adenoma, pituitary adenoma, or ectopic ACTH -producing tumor. Plan: Repeat full Cushings workup: AM cortisol, ACTH , salivary cortisol x4, 24-hour urine cortisol, and 5-HIAA (to rule out carcinoid). Order basic and comprehensive metabolic panels, lipid panel, J8r, TSH + free T4, urinalysis. Refer to endocrinology for definitive diagnosis and management. Document suspected Cushings in the medical record. Consider imaging (adrenal CT/MRI, pituitary MRI) if biochemical diagnosis is confirmed. Flushing Assessment: Episodic flushing, partially improved with testosterone, possibly perimenopausal but also being worked up for carcinoid and Cushings. Plan: 5-HIAA, plasma to rule out  carcinoid. Continue monitoring and re-assess with endocrinology. Bile salt-induced diarrhea Status post cholecystectomy Gastroesophageal reflux disease without esophagitis Assessment: Persistent diarrhea and yellow bile vomiting post-cholecystectomy, likely due to bile acid malabsorption. Plan: Prescribe cholestyramine  powder (QUESTRAN ) 4g TID with meals. Monitor for efficacy and tolerance, adjust as needed. GI referral if symptoms persist or worsen. Chronic fatigue Fibromyalgia Chronic pain syndrome Assessment: Chronic, severe fatigue likely multifactorial--Cushings, metabolic derangement, sleep disturbance, pain, psychiatric comorbidities. Plan: Evaluate for sleep apnea (repeat sleep study if indicated). Monitor for anemia, thyroid  dysfunction, infection, and other systemic causes. Address pain and psychiatric symptoms as contributors. 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 Plan: Continue pain management with current regimen (Percocet, Valium , Journavx ). Avoid Wellbutrin  and other known offenders. Consider pain management referral for advanced therapies (nerve blocks, non-opioid options) if pain remains uncontrolled. Document Wellbutrin  as a non-allergy adverse reaction. Send referral to pain clinic in case it persists, dermatology for possible biopsy to confirm small fiber neuropathy if persistent  Low testosterone Assessment: Persistent low testosterone, managed with topical testosterone cream. Plan: Continue current therapy. Monitor for side effects and efficacy. Periodic re-evaluation of hormone levels. Bipolar 1 disorder, mixed, moderate (HCC) Assessment: Bipolar disorder, mixed/depressed, currently stable on lurasidone  and escitalopram . History of severe reactions to medication changes. Plan: Continue current regimen. Discussed it is not safe for  her to dc latuda  Psychiatric follow-up as scheduled. Monitor for mood destabilization, especially during medical stress. Hepatic steatosis Assessment: Noted on recent CT. Risk likely increased due to rapid weight gain. Plan: Monitor LFTs, metabolic panel. Lifestyle and dietary counseling. Sleep disturbance She requires medication(s) to help with sleep Intolerance, drug Assessment: Extensive allergy and intolerance list, high risk for adverse drug reactions. Plan: Update allergy list and document adverse reactions. Avoid known triggers. Consider pharmacy review for drug-drug interactions and deprescribing where possible.  ORDER ASSOCIATIONS  #   DIAGNOSIS / CONDITION ICD-10 ENCOUNTER ORDER     ICD-10-CM   1. Weight gain  R63.5 5-HIAA, Plasma    Cortisol    HgB A1c    2. Cushing's syndrome (HCC)  E24.9 5-HIAA, Plasma    ACTH     Basic metabolic panel with GFR    Comprehensive metabolic panel with GFR    Cortisol    Cortisol,free,24 hour urine w/creatinine    HgB A1c    Lipid panel    Salivary Cortisol X4, Timed    TSH + free T4    Urinalysis w microscopic + reflex cultur    CANCELED: Cortisol    CANCELED: ACTH     CANCELED: Salivary Cortisol X4, Timed    CANCELED: Basic metabolic panel with GFR    CANCELED: HgB A1c    CANCELED: Lipid panel    CANCELED: TSH + free T4    CANCELED: Comprehensive metabolic panel with  GFR    CANCELED: Urinalysis w microscopic + reflex cultur    CANCELED: Cortisol,free,24 hour urine w/creatinine    3. Flushing  R23.2 Ambulatory referral to Dermatology    5-HIAA, Plasma    Salivary Cortisol X4, Timed    TSH + free T4    CANCELED: 5-HIAA, Plasma    4. Bile salt-induced diarrhea  K90.89 cholestyramine  (QUESTRAN ) 4 g packet    5-HIAA, Plasma    5. Chronic fatigue  R53.82 5-HIAA, Plasma    TSH + free T4    Urinalysis w microscopic + reflex cultur    Urinalysis w microscopic + reflex cultur    6. Allodynia  R20.8 Ambulatory referral to  Dermatology    5-HIAA, Plasma    Ambulatory referral to Pain Clinic    7. Chronic pain syndrome  G89.4 5-HIAA, Plasma    Salivary Cortisol X4, Timed    TSH + free T4    Urinalysis w microscopic + reflex cultur    8. Low testosterone  R79.89 5-HIAA, Plasma    Cortisol    HgB A1c    Salivary Cortisol X4, Timed    9. Bipolar 1 disorder, mixed, moderate (HCC)  F31.62 5-HIAA, Plasma    Basic metabolic panel with GFR    TSH + free T4    Urinalysis w microscopic + reflex cultur    10. Hepatic steatosis  K76.0 5-HIAA, Plasma    Cortisol    HgB A1c    11. Sleep disturbance  G47.9 5-HIAA, Plasma    Urinalysis w microscopic + reflex cultur    12. Status post cholecystectomy  Z90.49 5-HIAA, Plasma    13. Fibromyalgia  M79.7     14. Gastroesophageal reflux disease without esophagitis  K21.9     15. Intolerance, drug  Z78.9            Orders Placed in Encounter:  Lab Orders         5-HIAA, Plasma         ACTH          Basic metabolic panel with GFR         Comprehensive metabolic panel with GFR         Cortisol         Cortisol,free,24 hour urine w/creatinine         HgB A1c         Lipid panel         Salivary Cortisol X4, Timed         TSH + free T4         Urinalysis w microscopic + reflex cultur         Urinalysis w microscopic + reflex cultur     Meds ordered this encounter  Medications   cholestyramine  (QUESTRAN ) 4 g packet    Sig: Take 1 packet (4 g total) by mouth 3 (three) times daily with meals.    Dispense:  60 each    Refill:  12    Orders Placed This Encounter  Procedures   5-HIAA, Plasma    Standing Status:   Future    Number of Occurrences:   1    Expiration Date:   07/20/2025   ACTH     Standing Status:   Future    Number of Occurrences:   1    Expiration Date:   07/20/2025   Basic metabolic panel with GFR    Standing Status:   Future    Number of Occurrences:  1    Expiration Date:   07/20/2025   Comprehensive metabolic panel with GFR     Standing Status:   Future    Number of Occurrences:   1    Expiration Date:   07/20/2025   Cortisol    Standing Status:   Future    Number of Occurrences:   1    Expiration Date:   07/20/2025   Cortisol,free,24 hour urine w/creatinine    Standing Status:   Future    Expiration Date:   07/18/2025   HgB A1c    Standing Status:   Future    Number of Occurrences:   1    Expiration Date:   07/20/2025   Lipid panel    Standing Status:   Future    Number of Occurrences:   1    Expiration Date:   07/20/2025   Salivary Cortisol X4, Timed    Standing Status:   Future    Expiration Date:   07/20/2025   TSH + free T4    Standing Status:   Future    Number of Occurrences:   1    Expiration Date:   07/20/2025   Urinalysis w microscopic + reflex cultur    Standing Status:   Future    Number of Occurrences:   1    Expiration Date:   07/20/2025   Urinalysis w microscopic + reflex cultur    Standing Status:   Future    Expiration Date:   07/20/2025   Ambulatory referral to Dermatology    Referral Priority:   Routine    Referral Type:   Consultation    Referral Reason:   Specialty Services Required    Requested Specialty:   Dermatology    Number of Visits Requested:   1   Ambulatory referral to Pain Clinic    Referral Priority:   Routine    Referral Reason:   Specialty Services Required    Number of Visits Requested:   1     This document was synthesized by artificial intelligence (Abridge) using HIPAA-compliant recording of the clinical interaction;   We discussed the use of AI scribe software for clinical note transcription with the patient, who gave verbal consent to proceed. additional Info: This encounter employed state-of-the-art, real-time, collaborative documentation. The patient actively reviewed and assisted in updating their electronic medical record on a shared screen, ensuring transparency and facilitating joint problem-solving for the problem list, overview, and plan. This approach  promotes accurate, informed care. The treatment plan was discussed and reviewed in detail, including medication safety, potential side effects, and all patient questions. We confirmed understanding and comfort with the plan. Follow-up instructions were established, including contacting the office for any concerns, returning if symptoms worsen, persist, or new symptoms develop, and precautions for potential emergency department visits.   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 Extended time was required due to exceptional diagnostic complexity, failed prior treatments, multiple high-risk conditions, and intensive care coordination across specialties.  Qualifying Activities: Pre-visit review of multispecialty records, prior endocrine, pain, psychiatric, and GI workups, medication history, and adverse drug reactions. Comprehensive history and examination focused on pain characteristics, medication exposures, psychiatric stability, endocrine symptoms, and GI complaints. 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  Patient and family counseling regarding risks, benefits, and rationale for repeat endocrine workup, pain management options, and cholestyramine  initiation for bile acid diarrhea. Shared decision-making and education on sleep apnea, metabolic complications, and psychiatric comorbidity management. Care coordination including lab ordering (cortisol, ACTH , 24-hour urine, salivary cortisol, 5-HIAA), prescription management, allergy documentation, specialist referrals (endocrinology, pain management, dermatology for skin biopsy, nutrition),  and expedited records release.  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 Multiple high-risk conditions: bipolar disorder, chronic pain syndrome with possible medication-induced small fiber neuropathy, severe rapid weight gain, and metabolic complications. Failed prior therapies and extensive drug intolerance, necessitating individualized management and risk assessment. Specialist coordination for complex diagnostic and therapeutic planning. Social determinants impacting access to care and treatment adherence. Medical Necessity Statement: The extended time was medically necessary to prevent progression of undiagnosed endocrine disease, manage high-risk pain and psychiatric comorbidities, and ensure safe, effective care for a patient with failed prior therapies, diagnostic uncertainty, and significant barriers to treatment.  Peer Comparison & Benchmark Justification: This 42-minute encounter is consistent with CMS benchmarks for high-complexity primary care visits (typical range 40-50 minutes for [id number]), justified by multisystem involvement, substantial care coordination, and complex medical decision-making.  Outcome Justification: Time spent enabled: Immediate initiation of comprehensive endocrine and metabolic workup. Safe prescription management, avoiding known allergens/intolerances. Patient/family understanding of diagnostic plan and follow-up. Reduced risk of missed diagnosis or adverse outcomes due to complexity.

## 2024-07-18 NOTE — Patient Instructions (Addendum)
 Recommend apple watch 10 for monitoring for obstructive sleep apnea.    It was a pleasure seeing you today! Your health and satisfaction are our top priorities.  [name], MD  Here is an improved, patient-friendly After Visit Summary (AVS) wrap-up, with clearer instructions, reassurance, and actionable next steps. It is written to enhance understanding and engagement, while maintaining clinical accuracy.  VISIT SUMMARY  Today, we discussed your ongoing symptoms, including worsening pain, rapid and significant weight gain, chronic fatigue, and gastrointestinal problems. We reviewed your medical history, including hormonal imbalances, chronic pain, and medication sensitivities. Together, we developed a plan to address your symptoms and improve your quality of life.  YOUR CARE PLAN  1. Suspected Cushing Syndrome: We are concerned that high cortisol levels may be contributing to your weight gain, fatigue, and other symptoms. To investigate this, we have ordered specialized hormone tests (morning cortisol, ACTH, 24-hour urine cortisol, and a full hormone panel). You will also be referred to an endocrinologist, who specializes in hormone disorders, for further evaluation and management.  2. Severe Unintentional Weight Gain: Your significant weight gain over the past 15 months, despite eating very little, may be related to hormonal or metabolic changes. Additional lab tests will help us  better understand the cause, and your endocrinology referral will provide expert guidance.  3. Chronic Fatigue: Your ongoing fatigue may be related to hormonal imbalances or other underlying conditions. If fatigue continues, a sleep study may be recommended to rule out sleep apnea or other sleep disorders.  4. Bile Acid Diarrhea (Post-Gallbladder Removal): Your persistent diarrhea and GI symptoms are likely due to bile acid malabsorption after your gallbladder surgery. We have prescribed cholestyramine  powder  to help manage these symptoms. Please follow the instructions provided for taking this medication.  5. Chronic Pain with Allodynia/Hyperalgesia: Your pain, marked by increased sensitivity to touch and amplified pain responses, is likely due to nerve hypersensitization. We will continue to monitor your pain and adjust treatment as needed.  6. Low Testosterone: You are currently using topical testosterone cream to manage low testosterone levels. Continue this treatment as directed, and we will monitor your hormone levels over time.  7. Flushing (Perimenopausal Symptoms): Your flushing episodes are likely related to perimenopause and have partially improved with testosterone therapy. We will continue to monitor these symptoms.  8. Bipolar Disorder: Your bipolar disorder is currently stable on lurasidone . Please continue this medication as prescribed and inform us  of any changes in mood or mental health.  9. Medication Intolerance/Adverse Drug Reactions: You have experienced reactions to several medications, including Wellbutrin . We have documented these in your medical record to help avoid future problems.  NEXT STEPS & INSTRUCTIONS  Lab Work: Please complete the ordered blood and urine tests (morning cortisol, ACTH, 24-hour urine cortisol, hormone panel) before your endocrinology appointment. Endocrinology Referral: Schedule and attend your endocrinology visit as soon as possible for further evaluation. GI Medication: Begin taking cholestyramine  powder as prescribed to help with diarrhea and GI symptoms. Medications: Continue using your topical testosterone cream and lurasidone  as directed. Take other medications only as prescribed. Monitor Symptoms: Keep track of your symptoms, including pain, fatigue, GI changes, and mood. Report any new or worsening symptoms during your next visit or sooner if needed. Sleep Study: If fatigue persists or you have concerns about your sleep, discuss scheduling  a sleep study with your provider. Follow-Up: Return for follow-up visits as scheduled, and contact our office with any questions or concerns.  Your health and comfort are  our priority. Please reach out if you notice any changes, have questions about your medications, or need help with referrals or scheduling tests. We are here to support you every step of the way.  Let me know if you want this further personalized, shortened, or formatted for a specific EHR platform!     Your Providers PCP: [name], MD,  [phone]) Referring Provider: [name], MD,  [phone]) Care Team Provider: Jess Roby Masker, MD Care Team Provider: Shirly Carlin LITTIE DEVONNA,  [phone]) Care Team Provider: Dale Cough, MD,  [phone]) Care Team Provider: Mat Browning, MD,  [phone])  NEXT STEPS: [x]  Early Intervention: Schedule sooner appointment, call our on-call services, or go to emergency room if there is any significant Increase in pain or discomfort New or worsening symptoms Sudden or severe changes in your health [x]  Flexible Follow-Up: We recommend a No follow-ups on file. for optimal routine care. This allows for progress monitoring and treatment adjustments. [x]  Preventive Care: Schedule your annual preventive care visit! It's typically covered by insurance and helps identify potential health issues early. [x]  Lab & X-ray Appointments: Incomplete tests scheduled today, or call to schedule. X-rays: [facility] (M-F, 8:30am-noon or 1pm-5pm). [x]  Medical Information Release: Sign a release form at front desk to obtain relevant medical information we don't have.  MAKING THE MOST OF OUR FOCUSED 20 MINUTE APPOINTMENTS: [x]   Clearly state your top concerns at the beginning of the visit to focus our discussion [x]   If you anticipate you will need more time, please inform the front desk during scheduling - we can book multiple appointments in the  same week. [x]   If you have transportation problems- use our convenient video appointments or ask about transportation support. [x]   We can get down to business faster if you use MyChart to update information before the visit and submit non-urgent questions before your visit. Thank you for taking the time to provide details through MyChart.  Let our nurse know and she can import this information into your encounter documents.  Arrival and Wait Times: [x]   Arriving on time ensures that everyone receives prompt attention. [x]   Early morning (8a) and afternoon (1p) appointments tend to have shortest wait times. [x]   Unfortunately, we cannot delay appointments for late arrivals or hold slots during phone calls.  Getting Answers and Following Up [x]   Simple Questions & Concerns: For quick questions or basic follow-up after your visit, reach us  at [phone] or MyChart messaging. [x]   Complex Concerns: If your concern is more complex, scheduling an appointment might be best. Discuss this with the staff to find the most suitable option. [x]   Lab & Imaging Results: We'll contact you directly if results are abnormal or you don't use MyChart. Most normal results will be on MyChart within 2-3 business days, with a review message from Dr. Jesus. Haven't heard back in 2 weeks? Need results sooner? Contact us  at [phone]. [x]   Referrals: Our referral coordinator will manage specialist referrals. The specialist's office should contact you within 2 weeks to schedule an appointment. Call us  if you haven't heard from them after 2 weeks.  Staying Connected [x]   MyChart: Activate your MyChart for the fastest way to access results and message us . See the last page of this paperwork for instructions on how to activate.  Bring to Your Next Appointment [x]   Medications: Please bring all your medication bottles to your next appointment to ensure we have an accurate record of  your prescriptions. [x]   Health  Diaries: If you're monitoring any health conditions at home, keeping a diary of your readings can be very helpful for discussions at your next appointment.  Billing [x]   X-ray & Lab Orders: These are billed by separate companies. Contact the invoicing company directly for questions or concerns. [x]   Visit Charges: Discuss any billing inquiries with our administrative services team.  Your Satisfaction Matters [x]   Share Your Experience: We strive for your satisfaction! If you have any complaints, or preferably compliments, please let Dr. Jesus know directly or contact our Practice Administrators, Manuelita Rubin or [employer], by asking at the front desk.   Reviewing Your Records [x]   Review this early draft of your clinical encounter notes below and the final encounter summary tomorrow on MyChart after its been completed.  All orders placed so far are visible here: Weight gain Assessment & Plan: Severe Unintentional Weight Gain (R63.5, E66.9) Assessment: ~100 lbs weight gain over 15 months with low caloric intake, minimal response to GLP-1 agonists, and no evidence of hypothyroidism. Plan: Continue metabolic/hormonal evaluation as above. Nutrition consult for assessment of metabolic rate and dietary counseling. Monitor for complications of obesity (diabetes, hypertension, sleep apnea, fatty liver, etc.). Consider referral to bariatric medicine if endocrine causes are excluded.  She gained approximately 100 pounds over the last 15 months despite low caloric intake. Previous thyroid  and hormone evaluations were normal except for low testosterone. High cortisol levels suspected to contribute to weight gain. GLP-1 agonists not suitable due to lack of appetite issues. Further evaluate metabolic rate and cortisol levels. Order morning cortisol level and full hormone panel. Refer to endocrinologist for further evaluation.  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 Previous workup reportedly negative, but cyclic/evolving disease remains possible. Differential includes adrenal adenoma, pituitary adenoma, or ectopic ACTH-producing tumor. Plan: Repeat full Cushing's workup: AM cortisol, ACTH, salivary cortisol x4, 24-hour urine cortisol, and 5-HIAA (to rule out carcinoid). Order basic and comprehensive metabolic panels, lipid panel, J8r, TSH + free T4, urinalysis. Refer to endocrinology for definitive diagnosis and management. Document suspected Cushing's in the medical record. Consider imaging (adrenal CT/MRI, pituitary MRI) if biochemical diagnosis is confirmed.  Orders: -     5-HIAA, Plasma; Future -     ACTH; Future -     Basic metabolic panel with GFR; Future -     Comprehensive metabolic panel with GFR; Future -     Cortisol; Future -     Cortisol,free,24 hour urine w/creatinine; Future -     Hemoglobin A1c; Future -     Lipid panel; Future -     Salivary Cortisol X4, Timed; Future -     TSH + free T4; Future -     Urinalysis w microscopic + reflex cultur; Future  Flushing Assessment & Plan: Assessment: Episodic flushing, partially improved with testosterone, possibly perimenopausal but also being worked up for carcinoid and Cushing's. Plan: 5-HIAA, plasma to rule out carcinoid. Continue monitoring and re-assess with endocrinology.  Orders: -     5-HIAA, Plasma; Future -     Salivary Cortisol X4, Timed; Future -     TSH + free T4; Future -  Ambulatory referral to Dermatology  Bile salt-induced diarrhea Assessment & Plan: Assessment: Persistent diarrhea and yellow bile vomiting post-cholecystectomy, likely due to bile acid  malabsorption. Plan: Prescribe cholestyramine  powder (QUESTRAN ) 4g TID with meals. Monitor for efficacy and tolerance, adjust as needed. GI referral if symptoms persist or worsen.  Orders: -     Cholestyramine ; Take 1 packet (4 g total) by mouth 3 (three) times daily with meals.  Dispense: 60 each; Refill: 12 -     5-HIAA, Plasma; Future  Status post cholecystectomy Assessment & Plan: Assessment: Persistent diarrhea and yellow bile vomiting post-cholecystectomy, likely due to bile acid malabsorption. Plan: Prescribe cholestyramine  powder (QUESTRAN ) 4g TID with meals. Monitor for efficacy and tolerance, adjust as needed. GI referral if symptoms persist or worsen.  Orders: -     5-HIAA, Plasma; Future  Gastroesophageal reflux disease without esophagitis Assessment & Plan: Assessment: Persistent diarrhea and yellow bile vomiting post-cholecystectomy, likely due to bile acid malabsorption. Plan: Prescribe cholestyramine  powder (QUESTRAN ) 4g TID with meals. Monitor for efficacy and tolerance, adjust as needed. GI referral if symptoms persist or worsen.   Chronic fatigue Assessment & Plan: Assessment: Chronic, severe fatigue likely multifactorial--Cushing's, metabolic derangement, sleep disturbance, pain, psychiatric comorbidities. Plan: Evaluate for sleep apnea (repeat sleep study if indicated). Monitor for anemia, thyroid  dysfunction, infection, and other systemic causes. Address pain and psychiatric symptoms as contributors.  Orders: -     5-HIAA, Plasma; Future -     TSH + free T4; Future -     Urinalysis w microscopic + reflex cultur; Future -     Urinalysis w microscopic + reflex cultur; Future  Fibromyalgia Assessment & Plan: Assessment: Chronic, severe fatigue likely multifactorial--Cushing's, metabolic derangement, sleep disturbance, pain, psychiatric comorbidities. Plan: Evaluate for sleep apnea (repeat sleep study if indicated). Monitor for anemia, thyroid   dysfunction, infection, and other systemic causes. Address pain and psychiatric symptoms as contributors.   Chronic pain syndrome Assessment & Plan: Assessment: Chronic, severe fatigue likely multifactorial--Cushing's, metabolic derangement, sleep disturbance, pain, psychiatric comorbidities. Plan: Evaluate for sleep apnea (repeat sleep study if indicated). Monitor for anemia, thyroid  dysfunction, infection, and other systemic causes. Address pain and psychiatric symptoms as contributors.  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 Plan: Continue pain management with current regimen (Percocet, Valium , Journavx ). Avoid Wellbutrin  and other known offenders. Consider pain management referral for advanced therapies (nerve blocks, non-opioid options) if pain remains uncontrolled. Document Wellbutrin  as a non-allergy adverse reaction. Send referral to pain clinic in case it persists, dermatology for possible biopsy to confirm small fiber neuropathy if persistent   Orders: -     5-HIAA, Plasma; Future -     Ambulatory referral to Dermatology -     Ambulatory referral to Pain Clinic  Low testosterone Assessment & Plan: Assessment: Persistent low testosterone, managed with topical testosterone cream. Plan: Continue current therapy. Monitor for side effects and efficacy. Periodic re-evaluation of hormone levels.  Orders: -     5-HIAA, Plasma; Future -     Cortisol; Future -     Hemoglobin A1c; Future -     Salivary Cortisol X4, Timed; Future  Bipolar 1 disorder, mixed, moderate (HCC) Assessment &  Plan: Assessment: Bipolar disorder, mixed/depressed, currently stable on lurasidone   and escitalopram . History of severe reactions to medication changes. Plan: Continue current regimen. Discussed it is not safe for her to dc latuda  Psychiatric follow-up as scheduled. Monitor for mood destabilization, especially during medical stress.  Orders: -     5-HIAA, Plasma; Future -     Basic metabolic panel with GFR; Future -     TSH + free T4; Future -     Urinalysis w microscopic + reflex cultur; Future  Hepatic steatosis Assessment & Plan: Assessment: Noted on recent CT. Risk likely increased due to rapid weight gain. Plan: Monitor LFTs, metabolic panel. Lifestyle and dietary counseling.  Orders: -     5-HIAA, Plasma; Future -     Cortisol; Future -     Hemoglobin A1c; Future  Sleep disturbance -     5-HIAA, Plasma; Future -     Urinalysis w microscopic + reflex cultur; Future  Intolerance, drug Assessment & Plan: Assessment: Extensive allergy and intolerance list, high risk for adverse drug reactions. Plan: Update allergy list and document adverse reactions. Avoid known triggers. Consider pharmacy review for drug-drug interactions and deprescribing where possible.

## 2024-07-18 NOTE — Telephone Encounter (Signed)
 See call intake

## 2024-07-18 NOTE — Telephone Encounter (Signed)
 See call log

## 2024-07-19 ENCOUNTER — Ambulatory Visit (HOSPITAL_COMMUNITY)

## 2024-07-19 ENCOUNTER — Encounter: Payer: Self-pay | Admitting: Internal Medicine

## 2024-07-19 ENCOUNTER — Ambulatory Visit (INDEPENDENT_AMBULATORY_CARE_PROVIDER_SITE_OTHER): Admitting: Professional

## 2024-07-19 DIAGNOSIS — F314 Bipolar disorder, current episode depressed, severe, without psychotic features: Secondary | ICD-10-CM

## 2024-07-19 DIAGNOSIS — E669 Obesity, unspecified: Secondary | ICD-10-CM

## 2024-07-19 NOTE — Psych (Signed)
  [facility] PHP THERAPIST PROGRESS NOTE  Gina [name] [phone]   Session Time: 9:00 am - 10:00 am  Participation Level: Active  Behavioral Response: CasualAlertAnxious and Depressed  Type of Therapy: Group Therapy  Treatment Goals addressed: Coping  Progress Towards Goals: Initial  Interventions: CBT, DBT, Solution Focused, Strength-based, Supportive, and Reframing  Therapist Response: Clinician led check-in regarding current stressors and situation, and review of patient completed daily inventory. Clinician utilized active listening and empathetic response and validated patient emotions. Clinician facilitated processing group on pertinent issues.?   Summary: Patient arrived within time allowed. Patient rates her depression at a 5 and anxiety at a 3 on a scale of 1-10 with 10 being best. When asked about sleep and appetite, pt reports she slept 8 hours last night and ate 2x yesterday. Pt denied experiencing SI/SH/hopeless thoughts/HI. Pt shares more about her current lack of relationship with her daughter. Cln spent time discussing radical acceptance, setting boundaries, and focusing on what she can control/positives in situations. Pt able to process.?Pt engaged in discussion.?Pt reports goal of staying in the moment today. She reports she is unsure of how she will remind herself to stay in the moment, but will try to be more aware of when her mind wanders. Clinician assessed for immediate needs, medication compliance and efficacy, and safety concerns. Patient contracts for safety.?Patient demonstrates progress as evidenced by her continued engagement and by being receptive to treatment. Patient denies SI/HI/self-harm thoughts at the end of group and agrees to seek help should those thoughts/feelings occur.?      Suicidal/Homicidal: Nowithout intent/plan  Plan: ?Pt will continue in PHP and medication management while continuing to work on decreasing depression symptoms,?SI, and  anxiety symptoms,?and increasing the ability to self manage symptoms.       Collaboration of Care: Medication Management AEB Staci Kerns, NP  Patient/Guardian was advised Release of Information must be obtained prior to any record release in order to collaborate their care with an outside provider. Patient/Guardian was advised if they have not already done so to contact the registration department to sign all necessary forms in order for us  to release information regarding their care.   Consent: Patient/Guardian gives verbal consent for treatment and assignment of benefits for services provided during this visit. Patient/Guardian expressed understanding and agreed to proceed.   Diagnosis: Bipolar 1 disorder, depressed, severe (HCC) [F31.4]    1. Bipolar 1 disorder, depressed, severe (HCC)       [place]enton JINNY Devoid, [facility] 07/19/2024

## 2024-07-20 ENCOUNTER — Encounter: Payer: Self-pay | Admitting: Internal Medicine

## 2024-07-20 ENCOUNTER — Other Ambulatory Visit (INDEPENDENT_AMBULATORY_CARE_PROVIDER_SITE_OTHER)

## 2024-07-20 ENCOUNTER — Ambulatory Visit (HOSPITAL_COMMUNITY)

## 2024-07-20 DIAGNOSIS — R7989 Other specified abnormal findings of blood chemistry: Secondary | ICD-10-CM

## 2024-07-20 DIAGNOSIS — E249 Cushing's syndrome, unspecified: Secondary | ICD-10-CM

## 2024-07-20 DIAGNOSIS — Z789 Other specified health status: Secondary | ICD-10-CM | POA: Insufficient documentation

## 2024-07-20 DIAGNOSIS — Z9049 Acquired absence of other specified parts of digestive tract: Secondary | ICD-10-CM

## 2024-07-20 DIAGNOSIS — R232 Flushing: Secondary | ICD-10-CM

## 2024-07-20 DIAGNOSIS — R635 Abnormal weight gain: Secondary | ICD-10-CM

## 2024-07-20 DIAGNOSIS — R208 Other disturbances of skin sensation: Secondary | ICD-10-CM | POA: Insufficient documentation

## 2024-07-20 DIAGNOSIS — R5382 Chronic fatigue, unspecified: Secondary | ICD-10-CM

## 2024-07-20 DIAGNOSIS — K9089 Other intestinal malabsorption: Secondary | ICD-10-CM | POA: Insufficient documentation

## 2024-07-20 DIAGNOSIS — K760 Fatty (change of) liver, not elsewhere classified: Secondary | ICD-10-CM | POA: Insufficient documentation

## 2024-07-20 DIAGNOSIS — F3162 Bipolar disorder, current episode mixed, moderate: Secondary | ICD-10-CM

## 2024-07-20 DIAGNOSIS — G894 Chronic pain syndrome: Secondary | ICD-10-CM

## 2024-07-20 DIAGNOSIS — G479 Sleep disorder, unspecified: Secondary | ICD-10-CM

## 2024-07-20 DIAGNOSIS — M797 Fibromyalgia: Secondary | ICD-10-CM | POA: Insufficient documentation

## 2024-07-20 LAB — LIPID PANEL
Cholesterol: 188 mg/dL (ref 0–200)
HDL: 34.8 mg/dL — ABNORMAL LOW (ref >39.00)
LDL Cholesterol: 88 mg/dL (ref 0–99)
NonHDL: 153.18
Total CHOL/HDL Ratio: 5
Triglycerides: 325 mg/dL — ABNORMAL HIGH (ref 0.0–149.0)
VLDL: 65 mg/dL — ABNORMAL HIGH (ref 0.0–40.0)

## 2024-07-20 LAB — BASIC METABOLIC PANEL WITH GFR
BUN: 10 mg/dL (ref 6–23)
CO2: 25 meq/L (ref 19–32)
Calcium: 9.2 mg/dL (ref 8.4–10.5)
Chloride: 105 meq/L (ref 96–112)
Creatinine, Ser: 0.62 mg/dL (ref 0.40–1.20)
GFR: 106.72 mL/min (ref >60.00)
Glucose, Bld: 87 mg/dL (ref 70–99)
Potassium: 4.2 meq/L (ref 3.5–5.1)
Sodium: 137 meq/L (ref 135–145)

## 2024-07-20 LAB — COMPREHENSIVE METABOLIC PANEL WITH GFR
ALT: 11 U/L (ref 0–35)
AST: 12 U/L (ref 0–37)
Albumin: 4.2 g/dL (ref 3.5–5.2)
Alkaline Phosphatase: 120 U/L — ABNORMAL HIGH (ref 39–117)
BUN: 10 mg/dL (ref 6–23)
CO2: 25 meq/L (ref 19–32)
Calcium: 9.2 mg/dL (ref 8.4–10.5)
Chloride: 105 meq/L (ref 96–112)
Creatinine, Ser: 0.62 mg/dL (ref 0.40–1.20)
GFR: 106.72 mL/min (ref >60.00)
Glucose, Bld: 87 mg/dL (ref 70–99)
Potassium: 4.2 meq/L (ref 3.5–5.1)
Sodium: 137 meq/L (ref 135–145)
Total Bilirubin: 0.3 mg/dL (ref 0.2–1.2)
Total Protein: 6.3 g/dL (ref 6.0–8.3)

## 2024-07-20 LAB — CORTISOL: Cortisol, Plasma: 4.8 ug/dL

## 2024-07-20 LAB — HEMOGLOBIN A1C: Hgb A1c MFr Bld: 5.5 % (ref 4.6–6.5)

## 2024-07-20 NOTE — Assessment & Plan Note (Signed)
 Assessment: Persistent diarrhea and yellow bile vomiting post-cholecystectomy, likely due to bile acid malabsorption. Plan: Prescribe cholestyramine  powder (QUESTRAN ) 4g TID with meals. Monitor for efficacy and tolerance, adjust as needed. GI referral if symptoms persist or worsen.

## 2024-07-20 NOTE — Assessment & Plan Note (Signed)
 Assessment: Episodic flushing, partially improved with testosterone, possibly perimenopausal but also being worked up for carcinoid and Cushing's. Plan: 5-HIAA, plasma to rule out carcinoid. Continue monitoring and re-assess with endocrinology.

## 2024-07-20 NOTE — Assessment & Plan Note (Signed)
 Assessment: Persistent low testosterone, managed with topical testosterone cream. Plan: Continue current therapy. Monitor for side effects and efficacy. Periodic re-evaluation of hormone levels.

## 2024-07-20 NOTE — Assessment & Plan Note (Signed)
 Assessment: Chronic, severe fatigue likely multifactorial--Cushing's, metabolic derangement, sleep disturbance, pain, psychiatric comorbidities. Plan: Evaluate for sleep apnea (repeat sleep study if indicated). Monitor for anemia, thyroid  dysfunction, infection, and other systemic causes. Address pain and psychiatric symptoms as contributors.

## 2024-07-20 NOTE — Assessment & Plan Note (Signed)
 Severe Unintentional Weight Gain (R63.5, E66.9) Assessment: ~100 lbs weight gain over 15 months with low caloric intake, minimal response to GLP-1 agonists, and no evidence of hypothyroidism. Plan: Continue metabolic/hormonal evaluation as above. Nutrition consult for assessment of metabolic rate and dietary counseling. Monitor for complications of obesity (diabetes, hypertension, sleep apnea, fatty liver, etc.). Consider referral to bariatric medicine if endocrine causes are excluded.  She gained approximately 100 pounds over the last 15 months despite low caloric intake. Previous thyroid  and hormone evaluations were normal except for low testosterone. High cortisol levels suspected to contribute to weight gain. GLP-1 agonists not suitable due to lack of appetite issues. Further evaluate metabolic rate and cortisol levels. Order morning cortisol level and full hormone panel. Refer to endocrinologist for further evaluation.

## 2024-07-20 NOTE — Assessment & Plan Note (Signed)
 Assessment: Bipolar disorder, mixed/depressed, currently stable on lurasidone  and escitalopram . History of severe reactions to medication changes. Plan: Continue current regimen. Discussed it is not safe for her to dc latuda  Psychiatric follow-up as scheduled. Monitor for mood destabilization, especially during medical stress.

## 2024-07-20 NOTE — Assessment & Plan Note (Signed)
 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 Plan: Continue pain management with current regimen (Percocet, Valium , Journavx ). Avoid Wellbutrin  and other known offenders. Consider pain management referral for advanced therapies (nerve blocks, non-opioid options) if pain remains uncontrolled. Document Wellbutrin  as a non-allergy adverse reaction. Send referral to pain clinic in case it persists, dermatology for possible biopsy to confirm small fiber neuropathy if persistent

## 2024-07-20 NOTE — Assessment & Plan Note (Signed)
 Assessment: Extensive allergy and intolerance list, high risk for adverse drug reactions. Plan: Update allergy list and document adverse reactions. Avoid known triggers. Consider pharmacy review for drug-drug interactions and deprescribing where possible.

## 2024-07-20 NOTE — Assessment & Plan Note (Signed)
 Assessment: High clinical suspicion for Cushing's syndrome given rapid, severe, unintentional weight gain, central adiposity, fatigue, skin changes (dorsal cervical fat pad, edema), low testosterone, flushing, and mood disturbance. Previous workup reportedly negative, but cyclic/evolving disease remains possible. Differential includes adrenal adenoma, pituitary adenoma, or ectopic ACTH-producing tumor. Plan: Repeat full Cushing's workup: AM cortisol, ACTH, salivary cortisol x4, 24-hour urine cortisol, and 5-HIAA (to rule out carcinoid). Order basic and comprehensive metabolic panels, lipid panel, J8r, TSH + free T4, urinalysis. Refer to endocrinology for definitive diagnosis and management. Document suspected Cushing's in the medical record. Consider imaging (adrenal CT/MRI, pituitary MRI) if biochemical diagnosis is confirmed.

## 2024-07-20 NOTE — Assessment & Plan Note (Signed)
 Assessment: Noted on recent CT. Risk likely increased due to rapid weight gain. Plan: Monitor LFTs, metabolic panel. Lifestyle and dietary counseling.

## 2024-07-22 ENCOUNTER — Encounter: Payer: Self-pay | Admitting: Surgical

## 2024-07-22 ENCOUNTER — Ambulatory Visit: Payer: Self-pay | Admitting: Internal Medicine

## 2024-07-22 NOTE — Progress Notes (Signed)
 Office Visit Note   Patient: Gina [name]           Date of Birth: 1976-08-11           MRN: [id number] Visit Date: 07/16/2024 Requested by: [name], NP [address] [place],  [place] [address] PCP: [name], MD  Subjective: Chief Complaint  Patient presents with   Right Shoulder - Pain   Left Shoulder - Pain    HPI: Gina [name] is a 48 y.o. female who presents to the office reporting multiple joint complaints.  She is here to recheck her left foot with consideration of possible injection if no improvement.  Fortunately her foot is feeling a lot better since last visit.  Has some mild pain but nothing like it was.  More of her musculoskeletal complaints today actually centered around her shoulders and left knee with similar pain to the [facility] joint arthralgia and left knee patellar fracture that she was experiencing before.  She switched medications for management of her depression and unfortunately this caused a side effect for her and she had to discontinue medication and managing her depression for about a week which caused significant increase in her depressive symptoms.  Since then she has seen Dr. Jesus her PCP who has diagnosed her with hyperalgesia.  She feels like all the pain that she is experienced in the last year has come back and near full force.  Left shoulder bothers her more than the right shoulder.  No new injury.  She also feels like she did physical therapy a week ago and that caused increased pain in her left shoulder.  She has no actual injury event..                ROS: All systems reviewed are negative as they relate to the chief complaint within the history of present illness.  Patient denies fevers or chills.  Assessment & Plan: Visit Diagnoses:  1. Left shoulder pain, unspecified chronicity     Plan: Impression is 48 year old female who has recurrence of bilateral shoulder pain and left knee pain.  This timing  correlates with mental health crisis due to severity of her depression symptoms.  No recent actual injury event and suspect that a lot of her current pains which she had previously significantly improved are more of a physical manifestation of her worsened psychiatric state.  She is going to daily counseling sessions in a group setting and she feels in the last week that her mental state has substantially improved but not quite back to baseline yet.  Strongly encouraged her to keep up with these sessions and it is encouraging that there does not seem to be anything structurally wrong with her shoulders or knee that could be contributing to this new pain.  We will see her back in a month for clinical recheck to ensure that her pain is improving.  Follow-Up Instructions: Return in about 4 weeks (around 08/13/2024).   Orders:  No orders of the defined types were placed in this encounter.  Meds ordered this encounter  Medications   oxyCODONE -acetaminophen  (PERCOCET/ROXICET) 5-325 MG tablet    Sig: Take 1 tablet by mouth every 4 (four) hours as needed for severe pain (pain score 7-10).    Dispense:  30 tablet    Refill:  0      Procedures: No procedures performed   Clinical Data: No additional findings.  Objective: Vital Signs: LMP 01/01/2004   Physical Exam:  Constitutional: Patient appears well-developed HEENT:  Head: Normocephalic Eyes:EOM are normal Neck: Normal range of motion Cardiovascular: Normal rate Pulmonary/chest: Effort normal Neurologic: Patient is alert Skin: Skin is warm Psychiatric: Patient has normal mood and affect  Ortho Exam: Ortho exam demonstrates left shoulder with 40 degrees X rotation, 70 degrees abduction, 90 degrees forward elevation passively and actively.  Any more range of motion is possible but too painful for [name] to tolerate.  Right shoulder has 50 degrees X rotation, 95 degrees abduction, 130 degrees forward elevation passively and actively.   Intact rotator cuff strength of supra, infra, subscap rated 5/5 bilaterally.  Actually nerve intact with deltoid firing bilaterally.  She has some mild tenderness over the [facility] joint of the right shoulder but moderate to severe tenderness over the [facility] joint of the left shoulder.  She has point tenderness over the inferior pole of the patella consistent with similar examination from several months ago when she was dealing with patellar fracture.  She is able to perform straight leg raise.  There is no visible swelling in the left knee, left shoulder, right shoulder.  Palpable radial pulse of bilateral lower extremities.  Palpable DP pulse of the left lower extremity.  No effusion noted in the left knee.  Specialty Comments:  Narrative & Impression CLINICAL DATA:  Low back pain, prior surgery, new symptoms. Ongoing low back pain for 2 months. No known injury. No prior surgery to area.   EXAM: MRI LUMBAR SPINE WITHOUT AND WITH CONTRAST   TECHNIQUE: Multiplanar and multiecho pulse sequences of the lumbar spine were obtained without and with intravenous contrast.   CONTRAST:  10mL GADAVIST  GADOBUTROL  1 MMOL/ML IV SOLN   COMPARISON:  Lumbar MRI 04/02/2018. CT of the abdomen and pelvis 06/15/2023. Lumbar spine radiographs 09/18/2018.   FINDINGS: Segmentation: Conventional anatomy assumed, with the last open disc space designated L5-S1.Concordant with prior imaging.   Alignment: Mild convex right scoliosis. No focal angulation or listhesis.   Vertebrae: No worrisome osseous lesion, acute fracture or pars defect. Chronic Schmorl's node in the inferior endplate of L1. No evidence of discitis or osteomyelitis. No obvious postsurgical changes in the lumbar spine.   Conus medullaris: Extends to the L1-2 level and appears normal. No abnormal intradural enhancement.   Paraspinal and other soft tissues: No significant paraspinal findings.   Disc levels:   Sagittal images demonstrate no  significant disc space findings within the visualized lower thoracic spine.   L1-2: Stable loss of disc height with disc bulging and endplate osteophytes. No spinal stenosis or nerve root encroachment.   L2-3: Normal interspace.   L3-4: Normal interspace.   L4-5: Normal interspace.   L5-S1: Normal interspace.   IMPRESSION: 1. No acute findings or explanation for the patient's symptoms. 2. Stable spondylosis at L1-2 without resulting spinal stenosis or nerve root encroachment.     Electronically Signed   By: Elsie Perone M.D.   On: 07/29/2023 18:36  Imaging: No results found.   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   Status post cholecystectomy 07/20/2024   Hepatic steatosis 07/20/2024   Flushing 07/20/2024   Allodynia 07/20/2024   Low testosterone 07/20/2024   Fibromyalgia 07/20/2024   Intolerance, drug 07/20/2024   Cushing's syndrome (HCC) 07/18/2024   Bipolar 1 disorder, depressed, severe (HCC) 07/04/2024   Arthritis of left acromioclavicular joint 04/14/2024   Chronic pain in left shoulder 11/01/2023   Calcific supraspinatus tendonitis 10/03/2023  Bursitis of right shoulder 10/03/2023   Arthritis of right acromioclavicular joint 10/03/2023   S/P arthroscopy of right shoulder 09/27/2023   Persistent headaches 01/18/2023   Blurry vision 01/18/2023   Weight gain 01/18/2023   SBO (small bowel obstruction) (HCC) 09/13/2022   Nausea and vomiting 09/11/2022   Partial small bowel obstruction (HCC) 09/10/2022   Piriformis syndrome of both sides 05/31/2019   History of bipolar disorder 04/18/2019   Tardive dyskinesia 04/18/2019   Urticaria due to drug allergy 04/18/2019   Lumbar spondylosis 02/05/2019   Bipolar affective disorder, current episode mixed (HCC) 01/02/2019   Arthritis 01/02/2019   Cigarette nicotine  dependence without complication 01/02/2019   Bilateral temporomandibular joint  pain 10/02/2018   Referred otalgia, bilateral 10/02/2018   Paresthesias 03/09/2018   Chronic migraine without aura, intractable, with status migrainosus 04/22/2016   Bipolar 1 disorder, mixed, moderate (HCC) 07/15/2015    Class: Chronic   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   Sciatica 01/09/2015   Reactive hypoglycemia 12/17/2013   Musculoskeletal malfunction arising from mental factors 04/04/2013   Incisional hernia 11/15/2012   Other postprocedural status(V45.89) 11/15/2012   S/P hernia surgery 11/15/2012   Abdominal pain 11/11/2012   Chronic pain syndrome 11/11/2012   Disorder of sacrum 11/11/2012   Chronic pain 12/21/2011   Heartburn 10/04/2011   DYSPHAGIA 10/04/2011   Depression with anxiety 09/15/2010   CONSTIPATION 09/15/2010   LUNG NODULE 06/17/2010   Herpes simplex virus (HSV) infection 06/11/2010   Fatigue 06/11/2010   STRICTURE AND STENOSIS OF ESOPHAGUS 05/13/2010   ABDOMINAL WALL HERNIA 02/27/2010   ENDOMETRIOSIS 12/16/2009   GERD 10/23/2009   PALPITATIONS 10/23/2009   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   DYSTHYMIC DISORDER 06/20/2009   CONDYLOMA ACUMINATUM 04/23/2009   LENTIGO 04/23/2009   DENTAL PAIN 03/19/2009   CERVICAL RADICULOPATHY 12/11/2008   Cervical radiculopathy 12/11/2008   PANIC DISORDER WITHOUT AGORAPHOBIA 08/07/2008   TOBACCO ABUSE 08/07/2008   BACK PAIN, THORACIC REGION 07/07/2007   ALLERGIC RHINITIS 06/02/2007   Past Medical History:  Diagnosis Date   ABDOMINAL WALL HERNIA 02/27/2010   Abnormal Pap smear    ALLERGIC RHINITIS 06/02/2007   ANOREXIA, CHRONIC 08/07/2008   ANXIETY 09/15/2010   Arthritis    BACK PAIN, THORACIC REGION 07/07/2007   Bipolar disorder (HCC)    CERVICAL RADICULOPATHY 12/11/2008   Complication of anesthesia    Pateint reprots a high tolerance   Complication of anesthesia    pt states she has not had her voice completely back since surgery in Nov. 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   Eating disorder    ENDOMETRIOSIS 12/16/2009   FATIGUE 06/11/2010   Fibromyalgia    GERD 10/23/2009   Heart palpitations    HERPES SIMPLEX INFECTION 06/11/2010   LENTIGO 04/23/2009   LUNG NODULE 06/17/2010   Narcotic abuse, continuous (HCC)    pt denies   Ovarian cyst    Palpitations 10/23/2009   Panic disorder    Rheumatoid arthritis(714.0)    Stricture and stenosis of esophagus 05/13/2010   TOBACCO ABUSE 08/07/2008   Urinary tract infection    UTI (urinary tract infection)     Family History  Problem Relation Age of Onset   Depression Mother    Arthritis Mother    Hyperlipidemia Father    Hypertension Father    Heart attack Father    Heart disease Father    Cancer Father  Diabetes Father    Stroke Father     Past Surgical History:  Procedure Laterality Date   ABDOMINAL SURGERY     ABDOMINAL WALL MESH  REMOVAL     ABLATION ON ENDOMETRIOSIS     CESAREAN SECTION     x2    CYSTOSCOPY N/A 11/22/2023   Procedure: CYSTOSCOPY;  Surgeon: Elisabeth Valli BIRCH, MD;  Location: [facility];  Service: Urology;  Laterality: N/A;   DILATION AND CURETTAGE OF UTERUS     x1    ENDOMETRIAL ABLATION     esophageal dilatation x 4     GUM SURGERY     HERNIA REPAIR     X3   PARTIAL HYSTERECTOMY     POSTERIOR LUMBAR FUSION 2 WITH HARDWARE REMOVAL Left 03/29/2024   Procedure: ARTHROSCOPY, SHOULDER WITH DEBRIDEMENT;  Surgeon: Addie Cordella Hamilton, MD;  Location: [facility] OR;  Service: Orthopedics;  Laterality: Left;  LEFT SHOULDER ARTHROSCOPY, DEBRIDEMENT, DISTAL CLAVICLE EXCISION   RESECTION DISTAL CLAVICAL Left 03/29/2024   Procedure: EXCISION, CLAVICLE, DISTAL, OPEN;  Surgeon: Addie Cordella Hamilton, MD;  Location: [facility] OR;  Service: Orthopedics;  Laterality: Left;   SHOULDER SURGERY Right 09/2023   Social History   Occupational History   Not on file  Tobacco Use   Smoking status: Former    Current  packs/day: 0.20    Types: Cigarettes   Smokeless tobacco: Never   Tobacco comments:    2-3 cigarettes per day as of May 2025  Vaping Use   Vaping status: Some Days  Substance and Sexual Activity   Alcohol use: No   Drug use: No   Sexual activity: Yes    Birth control/protection: Surgical

## 2024-07-23 ENCOUNTER — Telehealth (HOSPITAL_COMMUNITY): Payer: Self-pay | Admitting: Psychiatry

## 2024-07-23 ENCOUNTER — Ambulatory Visit (INDEPENDENT_AMBULATORY_CARE_PROVIDER_SITE_OTHER): Admitting: Licensed Clinical Social Worker

## 2024-07-23 ENCOUNTER — Encounter (HOSPITAL_COMMUNITY): Payer: Self-pay | Admitting: Licensed Clinical Social Worker

## 2024-07-23 ENCOUNTER — Telehealth (HOSPITAL_COMMUNITY): Payer: Self-pay | Admitting: Licensed Clinical Social Worker

## 2024-07-23 DIAGNOSIS — F314 Bipolar disorder, current episode depressed, severe, without psychotic features: Secondary | ICD-10-CM | POA: Diagnosis not present

## 2024-07-23 LAB — URINALYSIS W MICROSCOPIC + REFLEX CULTURE

## 2024-07-23 NOTE — Telephone Encounter (Signed)
 D:  According to [facility] staff, pt is requesting to step down to virtual MH-IOP due to back pain and the sitting in PHP is worsening the pain.  A:  Placed call to orient pt and provider her with a start date, but the connection wasn't good and pt kept going in and out.  Hung up and placed second call to pt and the connection was even worse.  Pt requested that cm give her another call and leave vm as to when is a good time to call cm back. CM hung up and returned the call and left vm informing pt that she can call her back before 4 pm today.  R:  Pt receptive.

## 2024-07-23 NOTE — Psych (Signed)
  [facility] PHP THERAPIST PROGRESS NOTE  Gina [name] [id number]  Session Time: 9:00 am - 10:00 am  Participation Level: Active  Behavioral Response: CasualAlertEuthymic and guarded  Type of Therapy: Group Therapy  Treatment Goals addressed: Coping  Progress Towards Goals: Revised requesting IOP  Interventions: CBT, DBT, Solution Focused, Strength-based, Supportive, and Reframing  Therapist Response: Clinician led check-in regarding current stressors and situation, and review of patient completed daily inventory. Clinician utilized active listening and empathetic response and validated patient emotions. Clinician facilitated processing group on pertinent issues.?   Summary: Patient arrived within time allowed. Patient rates their depression at a 5 and anxiety at a 4 on a scale of 1-10 with 10 being best. Pt reports she is feeling tired and having physical pain. When asked to identify emotions, pt reports tired. I can talk about my medical stuff. Pt shares she is getting some answers and shares her diagnosis. She expressed relief and cln asked if her functioning is primarily impaired to physical issues vs mental health, and pt responded, Well, you can't be okay mentally if there's something wrong physically. Cln asked pt to identify emotions that present as a result of physical pain and illness, and pt responds Exhausting. Annoying. Pt requested to step down to IOP due to IOP being virtual and shorter, as she stated she cannot physically tolerate PHP. Cln informed PHP team and IOP case manager, Gina [name], of pt's request via secure chat. Pt also requested to be scheduled with Dr. Curry in outpatient (he declined, pt to be informed) and eating disorder resources for her daughter. When asked about sleep and appetite, pt reports she slept 8 hours last night and ate 2 meals yesterday. Pt denied experiencing SI/SH thoughts and feelings of hopelessness since last session. Cln sent pt MyChart  message with requested information at 1:21 pm.   Suicidal/Homicidal: Nowithout intent/plan  Plan: ?Pt will step down to IOP within this agency, date TBD. Pt and provider are aligned with discharge plan. Pt denies SI/HI at time of discharge.    Collaboration of Care: Medication Management AEB Staci Kerns, NP, Psychiatrist AEB Dr. Arfeen, and Referral or follow-up with counselor/therapist AEB Gina [name], IOP case manager  Patient/Guardian was advised Release of Information must be obtained prior to any record release in order to collaborate their care with an outside provider. Patient/Guardian was advised if they have not already done so to contact the registration department to sign all necessary forms in order for us  to release information regarding their care.   Consent: Patient/Guardian gives verbal consent for treatment and assignment of benefits for services provided during this visit. Patient/Guardian expressed understanding and agreed to proceed.   Diagnosis: Bipolar 1 disorder, depressed, severe (HCC) [F31.4]    1. Bipolar 1 disorder, depressed, severe (HCC)       Will [name], LCSW 07/23/2024

## 2024-07-23 NOTE — Telephone Encounter (Signed)
 Pt sent requested resources via MyChart

## 2024-07-23 NOTE — Telephone Encounter (Signed)
 D: Pt returned cm (MH-IOP's) call.  A:  Oriented pt.  Discussed the group's expectations with pt.  Encouraged pt to contact her insurance company for benefits.  Pt is requesting a referral to a psychiatrist and eating d/o resources for her adult daughter.  Informed pt, the case mgr will provide pt with appt before d/c if available and will send her resources for daughter upon starting on 07-25-24.  Pt stated she can't start tomorrow.  Inform treatment team.  R:  Pt receptive.

## 2024-07-24 ENCOUNTER — Ambulatory Visit (HOSPITAL_COMMUNITY)

## 2024-07-24 ENCOUNTER — Telehealth: Payer: Self-pay

## 2024-07-24 LAB — TSH+FREE T4: TSH W/REFLEX TO FT4: 4.64 m[IU]/L — ABNORMAL HIGH

## 2024-07-24 LAB — ACTH: C206 ACTH: 7 pg/mL (ref 6–50)

## 2024-07-24 LAB — T4, FREE: Free T4: 0.9 ng/dL (ref 0.8–1.8)

## 2024-07-24 NOTE — Telephone Encounter (Signed)
 Copied from CRM [id number]. Topic: Clinical - Medical Advice >> Jul 24, 2024 10:52 AM Aleatha C wrote: Reason for CRM: Patient would like Dr Jesus to call back she has been experiencing some pain from head to toe, says she is hot to touch, not a fever and has taken a 10mg  Oxycodone  and is still in pain and would like for the doctor to call back to help her on what to do she doesn't;t want to go to urgent care or ER   Please review and advise

## 2024-07-24 NOTE — Progress Notes (Signed)
  [facility] Intensive Outpatient Program Discharge Summary  Gina [name] [id number]  Admission date: 07/16/2024 Discharge date: 07/24/2024  Reason for admission: Per initial admission assessment: Gina [name] is a 48 year old female who presents after recent inpatient admission.  She reports a history of major depressive disorder, generalized anxiety disorder and bipolar disorder.  States she is currently followed by psychiatrist Gina [name]. ([facility]) states she is prescribed Lexapro , Latuda  and Ativan .  States she has been taking and tolerating medications well.  States she recently had her medications taper with Center in a tailspin.  States she was experiencing suicidal ideation.  Denying plan or intent.  Reports she had been off of her Wellbutrin  for the past 5 days.  States her mood is stabilized since her discharge.  Progress in Program Toward Treatment Goals: Revised patient stepping down to intensive outpatient programming.  Reported multiple concerns related to declining health.  States she is unable to sit upright for 5 hours a day due to chronic back issues.  States she has completed intensive outpatient programming in the past and would like to start program due to the virtual nature  Progress (rationale): Reported taking medications as directed  Collaboration of Care: Other patient to start intensive outpatient programming 07/25/2024  Patient/Guardian was advised Release of Information must be obtained prior to any record release in order to collaborate their care with an outside provider. Patient/Guardian was advised if they have not already done so to contact the registration department to sign all necessary forms in order for us  to release information regarding their care.   Consent: Patient/Guardian gives verbal consent for treatment and assignment of benefits for services provided during this visit. Patient/Guardian expressed understanding and agreed to  proceed.   Staci Kerns, NP 07/24/2024

## 2024-07-25 ENCOUNTER — Other Ambulatory Visit

## 2024-07-25 ENCOUNTER — Other Ambulatory Visit (HOSPITAL_COMMUNITY): Attending: Psychiatry | Admitting: Psychiatry

## 2024-07-25 ENCOUNTER — Ambulatory Visit (HOSPITAL_COMMUNITY)

## 2024-07-25 ENCOUNTER — Encounter (HOSPITAL_COMMUNITY): Payer: Self-pay | Admitting: Psychiatry

## 2024-07-25 ENCOUNTER — Other Ambulatory Visit: Payer: Self-pay | Admitting: Internal Medicine

## 2024-07-25 ENCOUNTER — Encounter (HOSPITAL_COMMUNITY): Payer: Self-pay

## 2024-07-25 DIAGNOSIS — F17290 Nicotine dependence, other tobacco product, uncomplicated: Secondary | ICD-10-CM | POA: Insufficient documentation

## 2024-07-25 DIAGNOSIS — F314 Bipolar disorder, current episode depressed, severe, without psychotic features: Secondary | ICD-10-CM | POA: Insufficient documentation

## 2024-07-25 NOTE — Progress Notes (Addendum)
 Virtual Visit via Video Note  I connected with Gina [name] on @TODAY @ at  9:00 AM EDT by a video enabled telemedicine application and verified that I am speaking with the correct person using two identifiers.  Location: Patient: at home Provider: at office   I discussed the limitations of evaluation and management by telemedicine and the availability of in person appointments. The patient expressed understanding and agreed to proceed.  I discussed the assessment and treatment plan with the patient. The patient was provided an opportunity to ask questions and all were answered. The patient agreed with the plan and demonstrated an understanding of the instructions.   The patient was advised to call back or seek an in-person evaluation if the symptoms worsen or if the condition fails to improve as anticipated.  I provided 30 minutes of non-face-to-face time during this encounter.   Gina [name], M.Ed, CNA   Patient ID: Gina [name], female   DOB: [birth year], 48 y.o.   MRN: [id number] D:  This is from pt's most recent CCA:   Pt presents as referral for PHP from [facility]. Pt states struggling with mental and physical health and a few days without medication last week declined her further. Pt reports she feels like my brain is on a rollercoaster. it swoops and crashes. I feel physically rattled. Pt reports historical diagnoses of bipolar 1, GAD, and PTSD. Pt reports 1 manic episode 9 years ago which precipitated hospitalization, and no manic sx since. Pt reports physical issues of fibromyalgia, 2 shoulder surgeries, a broken knee cap, issues with her foot, and perimenopause. Pt reports 2 years ago things began a strong decline and is unable to say why. Pt states  i don't know my body anymore. I don't trust my body anymore. Pt states sleep is variable, appetitie is low and she is gaining weight despite not eating, ADL's are low, and energy is horrible. Pt has regular MH providers and no  PCP. Pt reports some support from husband, younger daughter, and two friends. Pt states older daighter is no contact with pt, which is a stressor. Pt denies SI/HI.  Pt requested  to transfer to virtual MH-IOP from PHP d/t worsening back pain from sitting in the groups. Pt started virtual MH-IOP today.  Couldn't get her video to work on her computer at home.  Pt reports a hx of MDD, GAD, and Bipolar D/O. Denies SI/HI or A/V hallucinations.  Pt rates her depression at a 5 and her anxiety at a 4 on a scale of 1-10 (10 being the worst).  PHQ-9= 5.  Hx of being in MH-IOP yrs ago. Stressors according to pt:  1) No contact with daughter  2) Health Issues. A:  Oriented pt.  Pt was advised of ROI must be obtained prior to any records release in order to collaborate her care with an outside provider.  Pt was advised if she has not already done so to contact the front desk to sign all necessary forms in order for MH-IOP to release info re: her care.  Consent:  Pt gives verbal consent for tx and assignment of benefits for services provided during this telehealth group process.  Pt expressed understanding and agreed to proceed. Collaboration of care:  Collaborate with Staci Kerns, NP AEB, Dr. Jess HAIR; Darleene Ricker, LCSW AEB. Encouraged support groups through [employer] [facility] and [employer].   Pt will improve her mood as evidenced by being happy again, managing her mood and coping  with daily stressors for 5 out of 7 days for 60 days.     Gina [name], M.Ed,CNA

## 2024-07-25 NOTE — Telephone Encounter (Signed)
 Spoke with pt about her pain stated she has PT yesterday for two hours she is thinking that might have help or not. States her pain and all is doing well today. She does have access to mychart she will try her best to reach out to pcp on there as well ask making appt for VV in the further or appt with pcp. Was advise dr jesus is not able to talk on the phone due to seeing other patient thru out the day

## 2024-07-25 NOTE — Progress Notes (Signed)
 Virtual Visit via Video Note   I connected with Karrissa Parchment on 07/25/24 at  9:00 AM EDT by a video enabled telemedicine application and verified that I am speaking with the correct person using two identifiers.   At orientation to the IOP program, Case Manager discussed the limitations of evaluation and management by telemedicine and the availability of in person appointments. The patient expressed understanding and agreed to proceed with virtual visits throughout the duration of the program.   Location:  Patient: Patient Home Provider: [facility]   History of Present Illness: Bipolar I Disorder   Observations/Objective: Check In: Case Manager checked in with all participants to review discharge dates, insurance authorizations, work-related documents and needs from the treatment team regarding medications. Tiyona stated needs and engaged in discussion.    Initial Therapeutic Activity: Counselor facilitated a check-in with Tabita to assess for safety, sobriety and medication compliance.  Counselor also inquired about Manhattan's current emotional ratings, as well as any significant changes in thoughts, feelings or behavior since previous check in.  Denaya presented for session on time and was alert, oriented x5, with no evidence or self-report of active SI/HI or A/V H.  Dinisha reported compliance with medication and denied use of alcohol or illicit substances.  Lameeka reported scores of 4/10 for depression, 4/10 for anxiety, and 0/10 for anger/irritability.  Zalika denied any recent outbursts or panic attacks.  Malaika reported that a struggle was having a medication change recently, which led to a 'crash' and admission to the hospital.  Fiorela reported that she had previously tried MHIOP in the past and found it helpful, so she reached out for help again.  Deronda reported that her goal today is to run some errands after group, including going to the doctor's office.       Second Therapeutic Activity:  Counselor introduced Buel Cedar, [facility] Pharmacist, to provide psychoeducation on topic of medication compliance with members today.  Buel provided psychoeducation on classes of medications such as antidepressants, antipsychotics, what symptoms they are intended to treat, and any side effects one might encounter while on a particular prescription.  Time was allowed for clients to ask any questions they might have of [facility] regarding this specialty.  Intervention effectiveness could not be measured, as client did not participate.    Third Therapeutic Activity: Counselor provided demonstration of relaxation technique known as mindful breathing meditation to help members increase sense of calm, resiliency, and control.  Counselor guided members through process of getting comfortable, achieving a relaxed breathing rhythm, and focusing on this for several minutes, allowing troubling thoughts and feelings to come and go without rumination.  Counselor processed effectiveness of activity afterward in discussion with members, including how this impacted their mental state, whether it was difficult to stay focused, and if they plan to include it in self-care routine to improve day-to-day coping.  Intervention was ineffective, as evidenced by [name] participating in exercise, but reporting that she could not seem to stay focused.  She stated "I've tried meditation in the past.  I can't seem to quiet my mind, or make it work".    Assessment and Plan: Counselor recommends that Sadee remain in IOP treatment to better manage mental health symptoms, ensure stability and pursue completion of treatment plan goals. Counselor recommends adherence to crisis/safety plan, taking medications as prescribed, and following up with medical professionals if any issues arise.    Follow Up Instructions: Counselor will send [employer] Teams link for session tomorrow.  Anaalicia was advised to call back or seek an in-person evaluation if the  symptoms worsen or if the condition fails to improve as anticipated.   Collaboration of Care:   Medication Management AEB Staci Kerns, NP                                           Case Manager AEB Ricka Gaskins, CNA    Patient/Guardian was advised Release of Information must be obtained prior to any record release in order to collaborate their care with an outside provider. Patient/Guardian was advised if they have not already done so to contact the registration department to sign all necessary forms in order for us  to release information regarding their care.    Consent: Patient/Guardian gives verbal consent for treatment and assignment of benefits for services provided during this visit. Patient/Guardian expressed understanding and agreed to proceed.   I provided 175 minutes of non-face-to-face time during this encounter.   Darleene Ricker, LCSW, LCAS 07/25/24

## 2024-07-25 NOTE — Progress Notes (Signed)
 Virtual Visit via Video Note  I connected with [name] on 07/25/24 at  9:00 AM EDT by a video enabled telemedicine application and verified that I am speaking with the correct person using two identifiers.  Location: Patient: Home Provider: Office   I discussed the limitations of evaluation and management by telemedicine and the availability of in person appointments. The patient expressed understanding and agreed to proceed.   I discussed the assessment and treatment plan with the patient. The patient was provided an opportunity to ask questions and all were answered. The patient agreed with the plan and demonstrated an understanding of the instructions.   The patient was advised to call back or seek an in-person evaluation if the symptoms worsen or if the condition fails to improve as anticipated.  I provided 15 minutes of non-face-to-face time during this encounter.   Staci LOISE Kerns, NP    Psychiatric Initial Adult Assessment   Patient Identification: Alechia [name] MRN:  [id number] Date of Evaluation:  07/25/2024 Referral Source: [facility],PHP- step down  Chief Complaint: Depression and anxiety  Visit Diagnosis:    ICD-10-CM   1. Bipolar 1 disorder, depressed, severe (HCC)  F31.4        History of Present Illness:  Gina [name] is a 48 year old female who presents after recent inpatient admission.  She reports a history of major depressive disorder, generalized anxiety disorder and bipolar disorder.  States she is currently followed by psychiatrist Jess. ([facility]) states she is prescribed Lexapro , Latuda  and Ativan .  States she has been taking and tolerating medications well.  States she recently had her medications taper with Center in a tailspin.  States she was experiencing suicidal ideation.  Denying plan or intent.  Reports she had been off of her Wellbutrin  for the past 5 days.  States her mood is stabilized since her discharge.      Evaluation on 07/25/2024 -recently seen and evaluated in partial hospitalization programming patient requested to stepdown due to inability to sit for long periods of time.  Reports history related to chronic back pain.  Stated virtual options would be of better opportunity, to refresh her coping skills and knowledge..  During this assessment she denied current related to suicidal or homicidal ideations.  Denied auditory visual hallucinations.  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  Continues to endorse stressors related to strained relationship between she and her daughter.  Recent diagnoses related to Cushing's disease she reports 100 pound weight gain in 15 months.Patient to start intensive outpatient programming 07/25/2024.   Noted per previous assessment: Gina [name] reports she had been hospitalized twice in her adult life.  States she is currently followed by therapist Venyetta.  She reports a history of childhood trauma.  Reports a family history of mental illness however denies official diagnoses.  States she previously attended intensive outpatient program in 2019.  Reports a fair appetite.  States she is resting okay throughout the night.  Reports she has been married for the past 30 years.  States her significant other is supportive.  States she has a 46 year old and a 46 year old.  During evaluation Gina [name] is sitting pleasant; she is alert/oriented x 4; calm/cooperative; and mood congruent with affect.  Patient is speaking in a clear tone at moderate volume, and normal pace; with good eye contact.Her thought process is coherent and relevant; There is no indication that she is currently responding to internal/external stimuli or  experiencing delusional thought content.  Patient denies suicidal/self-harm/homicidal ideation, psychosis, and paranoia.  Patient has remained calm throughout assessment and has answered questions  appropriately.   Associated Signs/Symptoms: Depression Symptoms:  depressed mood, anxiety, (Hypo) Manic Symptoms:  Distractibility, Anxiety Symptoms:  Excessive Worry, Psychotic Symptoms:  Hallucinations: None PTSD Symptoms: Reports a history related to childhood trauma.  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  States she is currently on disability.  Past Psychiatric History: Carries a diagnosis related to major depressive disorder, bipolar 1 disorder, generalized anxiety disorder.  Currently followed by therapy and psychiatry.  Prescribed Latuda , Ativan  and Lexapro .  Previous Psychotropic Medications: Yes   Substance Abuse History in the last 12 months:  No.  Consequences of Substance Abuse: NA  Past Medical History:  Past Medical History:  Diagnosis Date   ABDOMINAL WALL HERNIA 02/27/2010   Abnormal Pap smear    ALLERGIC RHINITIS 06/02/2007   ANOREXIA, CHRONIC 08/07/2008   ANXIETY 09/15/2010   Arthritis    BACK PAIN, THORACIC REGION 07/07/2007   Bipolar disorder (HCC)    CERVICAL RADICULOPATHY 12/11/2008   Complication of anesthesia    Pateint reprots a high tolerance   Complication of anesthesia    pt states she has not had her voice completely back since surgery in Nov. 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   Eating disorder    ENDOMETRIOSIS 12/16/2009   FATIGUE 06/11/2010   Fibromyalgia    GERD 10/23/2009   Heart palpitations    HERPES SIMPLEX INFECTION 06/11/2010   LENTIGO 04/23/2009   LUNG NODULE 06/17/2010   Narcotic abuse, continuous (HCC)    pt denies   Ovarian cyst    Palpitations 10/23/2009   Panic disorder    Rheumatoid arthritis(714.0)    Stricture and stenosis of esophagus 05/13/2010   TOBACCO ABUSE 08/07/2008   Urinary tract infection    UTI (urinary tract infection)      Past Surgical History:  Procedure Laterality Date   ABDOMINAL SURGERY     ABDOMINAL WALL MESH  REMOVAL     ABLATION ON ENDOMETRIOSIS     CESAREAN SECTION     x2    CYSTOSCOPY N/A 11/22/2023   Procedure: CYSTOSCOPY;  Surgeon: Elisabeth Valli BIRCH, MD;  Location: [facility];  Service: Urology;  Laterality: N/A;   DILATION AND CURETTAGE OF UTERUS     x1    ENDOMETRIAL ABLATION     esophageal dilatation x 4     GUM SURGERY     HERNIA REPAIR     X3   PARTIAL HYSTERECTOMY     POSTERIOR LUMBAR FUSION 2 WITH HARDWARE REMOVAL Left 03/29/2024   Procedure: ARTHROSCOPY, SHOULDER WITH DEBRIDEMENT;  Surgeon: Addie Cordella Hamilton, MD;  Location: [facility] OR;  Service: Orthopedics;  Laterality: Left;  LEFT SHOULDER ARTHROSCOPY, DEBRIDEMENT, DISTAL CLAVICLE EXCISION   RESECTION DISTAL CLAVICAL Left 03/29/2024   Procedure: EXCISION, CLAVICLE, DISTAL, OPEN;  Surgeon: Addie Cordella Hamilton, MD;  Location: [facility] OR;  Service: Orthopedics;  Laterality: Left;   SHOULDER SURGERY Right 09/2023    Family Psychiatric History: Undiagnosed  Family History:  Family History  Problem Relation Age of Onset   Depression Mother    Arthritis Mother    Hyperlipidemia Father    Hypertension Father    Heart attack Father    Heart disease Father  Cancer Father    Diabetes Father    Stroke Father     Social History:   Social History   Socioeconomic History   Marital status: Married    Spouse name: Not on file   Number of children: 2   Years of education: Not on file   Highest education level: Master's degree (e.g., MA, MS, MEng, MEd, MSW, MBA)  Occupational History   Not on file  Tobacco Use   Smoking status: Former    Current packs/day: 0.20    Types: Cigarettes   Smokeless tobacco: Never   Tobacco comments:    2-3 cigarettes per day as of May 2025  Vaping Use   Vaping status: Some Days  Substance and Sexual Activity   Alcohol use: No   Drug use: No   Sexual activity: Yes    Birth control/protection: Surgical   Other Topics Concern   Not on file  Social History Narrative   Not on file   Social Drivers of Health   Financial Resource Strain: Low Risk  (04/30/2024)   Overall Financial Resource Strain (CARDIA)    Difficulty of Paying Living Expenses: Not hard at all  Food Insecurity: No Food Insecurity (07/04/2024)   Hunger Vital Sign    Worried About Running Out of Food in the Last Year: Never true    Ran Out of Food in the Last Year: Never true  Transportation Needs: No Transportation Needs (07/04/2024)   PRAPARE - Administrator, Civil Service (Medical): No    Lack of Transportation (Non-Medical): No  Physical Activity: Inactive (04/30/2024)   Exercise Vital Sign    Days of Exercise per Week: 0 days    Minutes of Exercise per Session: 0 min  Stress: No Stress Concern Present (04/30/2024)   [employer] of Occupational Health - Occupational Stress Questionnaire    Feeling of Stress: Only a little  Social Connections: Moderately Integrated (04/30/2024)   Social Connection and Isolation Panel    Frequency of Communication with Friends and Family: Three times a week    Frequency of Social Gatherings with Friends and Family: Twice a week    Attends Religious Services: More than 4 times per year    Active Member of [employer] or Organizations: No    Attends Banker Meetings: Never    Marital Status: Married    Additional Social History:   Allergies:   Allergies  Allergen Reactions   Lamictal [Lamotrigine] Anaphylaxis and Other (See Comments)    STEVEN JOHNSON'S SYNDROME   Maxalt  [Rizatriptan ] Anaphylaxis   Morphine  And Codeine  Other (See Comments)    Makes pt very mean I get mean Makes pt very mean Makes pt very mean   Nicotine  Swelling and Palpitations    Other reaction(s): Respiratory Distress (ALLERGY/intolerance), Swelling (ALLERGY/intolerance), Tachycardia / Palpitations  (intolerance) Patches, lozenges Patches caused palpations lozenges throat  swelled   Wellbutrin  [Bupropion ] Other (See Comments)    Severe allodynia / hyperalgesia reaction   Zofran  Anaphylaxis   Chantix  [Varenicline  Tartrate] Other (See Comments)     Diaphoresis, sweating.  Night terrors.  went crazy   Ciprofloxacin  Diarrhea and Nausea And Vomiting   Codeine  Nausea And Vomiting   Haldol  [Haloperidol ] Itching, Rash and Other (See Comments)    FLUSHING   Ibuprofen Nausea And Vomiting   Lidoderm  [Lidocaine ] Other (See Comments)    DOESN'T WORK FOR PATIENT   Skelaxin [Metaxalone] Other (See Comments)    Pt does not remember reaction, REAL BAD  REACTION   Sulfa Antibiotics Diarrhea, Other (See Comments) and Nausea And Vomiting    GI PAINS ALSO GI PAINS ALSO    Sulfasalazine Diarrhea    GI PAINS   Toradol  [Ketorolac  Tromethamine ] Nausea And Vomiting   Ambien [Zolpidem] Other (See Comments)    Mental status changes   Doxycycline      Other Reaction(s): GI Intolerance   Duloxetine Other (See Comments)    Unknown reaction   Ingrezza [Valbenazine Tosylate] Hives   Lyrica [Pregabalin] Other (See Comments)    Unknown reaction type   Methadone     seizures   Neurontin  [Gabapentin ] Nausea And Vomiting   Other     Pt states her body does not react well to the new type of prep must use betadine   Prednisone  Other (See Comments)    Interaction to other medications   Amoxicillin Rash    Has patient had a PCN reaction causing immediate rash, facial/tongue/throat swelling, SOB or lightheadedness with hypotension: No Has patient had a PCN reaction causing severe rash involving mucus membranes or skin necrosis: No Has patient had a PCN reaction that required hospitalization: No Has patient had a PCN reaction occurring within the last 10 years: No If all of the above answers are NO, then may proceed with Cephalosporin use.    Aripiprazole Anxiety    hallucinations   Chlorhexidine  Itching and Rash   Penicillins Rash    Other reaction(s): Urticaria / Hives  (ALLERGY) Has patient had a PCN reaction causing immediate rash, facial/tongue/throat swelling, SOB or lightheadedness with hypotension: No Has patient had a PCN reaction causing severe rash involving mucus membranes or skin necrosis: No Has patient had a PCN reaction that required hospitalization: No Has patient had a PCN reaction occurring within the last 10 years: No If all of the above answers are NO, then may proceed with Cephalosporin use.   Tramadol  Rash    Metabolic Disorder Labs: Lab Results  Component Value Date   HGBA1C 5.5 07/20/2024   No results found for: PROLACTIN Lab Results  Component Value Date   CHOL 188 07/20/2024   TRIG 325.0 (H) 07/20/2024   HDL 34.80 (L) 07/20/2024   CHOLHDL 5 07/20/2024   VLDL 65.0 (H) 07/20/2024   LDLCALC 88 07/20/2024   LDLCALC 80 02/02/2007   Lab Results  Component Value Date   TSH 1.781 07/04/2024    Therapeutic Level Labs: Lab Results  Component Value Date   LITHIUM 0.45 (L) 02/09/2007   No results found for: CBMZ No results found for: VALPROATE  Current Medications: Current Outpatient Medications  Medication Sig Dispense Refill   calcium carbonate (TUMS - DOSED IN MG ELEMENTAL CALCIUM) 500 MG chewable tablet Chew 1 tablet by mouth 4 (four) times daily as needed for indigestion or heartburn.     Cholecalciferol (VITAMIN D3 PO) Take 2 each by mouth daily. Gummy     cholestyramine  (QUESTRAN ) 4 g packet Take 1 packet (4 g total) by mouth 3 (three) times daily with meals. 60 each 12   diazepam  (VALIUM ) 5 MG tablet Take 1 tablet (5 mg total) by mouth 2 (two) times daily as needed for muscle spasms. 10 tablet 0   EC-RX Testosterone 0.2 % CREA Place onto the skin.     escitalopram  (LEXAPRO ) 10 MG tablet Take 10 mg by mouth daily.     hyoscyamine  (LEVSIN  SL) 0.125 MG SL tablet Place 0.125 mg under the tongue every 4 (four) hours as needed for cramping.  LORazepam  (ATIVAN ) 1 MG tablet Take 1 mg by mouth every 8 (eight)  hours as needed for anxiety.     lurasidone  (LATUDA ) 40 MG TABS tablet Take 40 mg by mouth every evening.     methocarbamol  (ROBAXIN ) 500 MG tablet TAKE 1 TABLET BY MOUTH EVERY 8 HOURS AS NEEDED FOR UP TO 7 DAYS 21 tablet 0   oxyCODONE -acetaminophen  (PERCOCET/ROXICET) 5-325 MG tablet Take 1 tablet by mouth every 4 (four) hours as needed for severe pain (pain score 7-10). 30 tablet 0   Oyster Shell (OYSTER CALCIUM) 500 MG TABS tablet Take 500 mg of elemental calcium by mouth daily.     PRESCRIPTION MEDICATION Apply 1-2 Applications topically at bedtime. Testosterone 4% cream     promethazine  (PHENERGAN ) 25 MG tablet Take 25 mg by mouth every 6 (six) hours as needed for nausea or vomiting.     Suzetrigine  (JOURNAVX ) 50 MG TABS Take 1 tablet by mouth 2 (two) times daily as needed. 60 tablet 5   No current facility-administered medications for this visit.    Musculoskeletal: Strength & Muscle Tone: within normal limits Gait & Station: normal Patient leans: N/A  Psychiatric Specialty Exam: Review of Systems  Psychiatric/Behavioral:  Negative for behavioral problems, decreased concentration and suicidal ideas. The patient is nervous/anxious.     Last menstrual period 01/01/2004.There is no height or weight on file to calculate BMI.  General Appearance: Casual  Eye Contact:  Good  Speech:  Clear and Coherent  Volume:  Normal  Mood:  Anxious and Depressed  Affect:  Congruent  Thought Process:  Coherent  Orientation:  Full (Time, Place, and Person)  Thought Content:  Logical  Suicidal Thoughts:  No  Homicidal Thoughts:  No  Memory:  Immediate;   Good Recent;   Good  Judgement:  Good  Insight:  Good  Psychomotor Activity:  Normal  Concentration:  Concentration: Good  Recall:  Good  Fund of Knowledge:Good  Language: Good  Akathisia:  No  Handed:  Right  AIMS (if indicated):  not done  Assets:  Communication Skills Desire for Improvement  ADL's:  Intact  Cognition: WNL  Sleep:   Fair   Screenings: AUDIT    Flowsheet Row [facility]  Alcohol Use Disorder Identification Test Final Score (AUDIT) 0   GAD-7    Flowsheet Row Counselor from 07/23/2024 in [facility] Counselor from 07/16/2024 in [facility] Office Visit from 04/30/2024 in [facility] [place]  Total GAD-7 Score 5 9 5    PHQ2-9    Flowsheet Row Counselor from 07/23/2024 in [facility] Counselor from 07/16/2024 in [facility] Office Visit from 04/30/2024 in [facility] [place] Office Visit from 12/06/2019 in [facility] at [place] [facility] from 05/25/2019 in [facility] at [facility] Total Score 2 4 0 0 1  PHQ-9 Total Score 7 15 9  -- --   Flowsheet Row ED from 07/12/2024 in [facility] at [facility] ED from 07/09/2024 in [facility] Department at [facility] Admission (Discharged[facility]  C-SSRS RISK CATEGORY No Risk No Risk No Risk    Assessment and Plan:  Patient to start intensive outpatient programming on 07/25/2024 - Continue medications as directed  Collaboration of Care: Psychiatrist AEB psychiatrist Jess  patient enrolled in Intensive Outpatient Program (  IOP), patient's current medications are to be continued, the following medications are being continued and a comprehensive treatment plan will be developed and side effects of medications have been reviewed with patient  Treatment options and alternatives reviewed with patient and patient understands the above plan. Treatment plan was reviewed and agreed upon by NP T.Ezzard and patient Emmett Arntz need for group services.  Patient/Guardian was advised Release of Information must be obtained prior to any record  release in order to collaborate their care with an outside provider. Patient/Guardian was advised if they have not already done so to contact the registration department to sign all necessary forms in order for us  to release information regarding their care.   Consent: Patient/Guardian gives verbal consent for treatment and assignment of benefits for services provided during this visit. Patient/Guardian expressed understanding and agreed to proceed.   Staci LOISE Ezzard, NP 9/24/202511:24 AM

## 2024-07-26 ENCOUNTER — Other Ambulatory Visit (HOSPITAL_COMMUNITY): Admitting: Psychiatry

## 2024-07-26 ENCOUNTER — Ambulatory Visit (HOSPITAL_COMMUNITY)

## 2024-07-26 DIAGNOSIS — F314 Bipolar disorder, current episode depressed, severe, without psychotic features: Secondary | ICD-10-CM

## 2024-07-26 LAB — URINALYSIS W MICROSCOPIC + REFLEX CULTURE
Bacteria, UA: NONE SEEN /HPF
Bilirubin Urine: NEGATIVE
Glucose, UA: NEGATIVE
Hgb urine dipstick: NEGATIVE
Hyaline Cast: NONE SEEN /LPF
Ketones, ur: NEGATIVE
Leukocyte Esterase: NEGATIVE
Nitrites, Initial: NEGATIVE
Protein, ur: NEGATIVE
RBC / HPF: NONE SEEN /HPF (ref 0–2)
Specific Gravity, Urine: 1.012 (ref 1.001–1.035)
WBC, UA: NONE SEEN /HPF (ref 0–5)
pH: 7.5 (ref 5.0–8.0)

## 2024-07-26 LAB — HOUSE ACCOUNT TRACKING

## 2024-07-26 LAB — NO CULTURE INDICATED

## 2024-07-26 MED ORDER — TIRZEPATIDE-WEIGHT MANAGEMENT 2.5 MG/0.5ML ~~LOC~~ SOAJ: 2.5000 mg | SUBCUTANEOUS | 11 refills | Status: DC

## 2024-07-26 NOTE — Progress Notes (Signed)
 Virtual Visit via Video Note   I connected with Gina [name] on 07/26/24 at  9:00 AM EDT by a video enabled telemedicine application and verified that I am speaking with the correct person using two identifiers.   At orientation to the IOP program, Case Manager discussed the limitations of evaluation and management by telemedicine and the availability of in person appointments. The patient expressed understanding and agreed to proceed with virtual visits throughout the duration of the program.   Location:  Patient: Patient Home Provider: [facility] Office   History of Present Illness: Bipolar I Disorder   Observations/Objective: Check In: Case Manager checked in with all participants to review discharge dates, insurance authorizations, work-related documents and needs from the treatment team regarding medications. Gina [name] stated needs and engaged in discussion.    Initial Therapeutic Activity: Counselor facilitated a check-in with Gina [name] to assess for safety, sobriety and medication compliance.  Counselor also inquired about Gina [name]'s current emotional ratings, as well as any significant changes in thoughts, feelings or behavior since previous check in.  Gina [name] presented for session on time and was alert, oriented x5, with no evidence or self-report of active SI/HI or A/V H.  Gina [name] reported compliance with medication and denied use of alcohol or illicit substances.  Gina [name] reported scores of 4/10 for depression, 2/10 for anxiety, and 0/10 for anger/irritability.  Gina [name] denied any recent outbursts or panic attacks.  Gina [name] reported that a success was running some errands yesterday to be productive such as taking the dog out, cleaning the fridge, and fixed dinner, although this took a physical toll on her and she spent the rest of the chair resting in her chair.  Gina [name] reported that her goal today is to get a pedicure.          Second Therapeutic Activity: Counselor introduced topic of stress management  today.  Counselor provided definition of stress as feeling tense, overwhelmed, worn out, and/or exhausted, and noted that in small amounts, stress can be motivating until things become too overwhelming to manage.  Counselor also explained how stress can be acute (brief but intense) or chronic (long-lasting) and this can impact the severity of symptoms one can experience in the physical, emotional, and behavioral categories.  Counselor inquired about members' specific stressors, how long they have been prevalent, and the various symptoms that tend to manifest as a result.  Counselor also offered several stress management strategies to help improve members' coping ability, including journaling, gratitude practice, relaxation techniques, and time management tips.  Counselor also explained that research has shown a strong support network composed of trusted family, friends, or community members can increase resilience in times of stress, and inquired about who members can reach out to for help in managing stressors.  Counselor encouraged members to consider discussing stressor 'red flags' with their close supports that can be monitored and strategies for assisting them in times of crisis.  Intervention was effective, as evidenced by Gina [name] actively participating in discussion on subject, reporting that her most significant stressors include family conflict, pain and fatigue, and keeping healthy.  Gina [name] was able to identify several warning signs related to stress, including increased physical pain, headaches, fatigue, and crying spells.  Gina [name] reported that her stress management goal is to reduce average stress level from 10/10 most days down to 5/10 over the next 90 days by cutting down on daily tasks and substituting for more self-care.  Gina [name] also expressed receptiveness to several stress management strategies practiced today in  session, including deep breathing, using a stress tracker to identify stressors and  reinforce healthier coping skills, and scheduling for more pleasant self-care activities in her schedule.       Assessment and Plan: Counselor recommends that Gina [name] remain in IOP treatment to better manage mental health symptoms, ensure stability and pursue completion of treatment plan goals. Counselor recommends adherence to crisis/safety plan, taking medications as prescribed, and following up with medical professionals if any issues arise.    Follow Up Instructions: Counselor will send [employer] Teams link for session tomorrow.  Gina [name] was advised to call back or seek an in-person evaluation if the symptoms worsen or if the condition fails to improve as anticipated.   Collaboration of Care:   Medication Management AEB Staci Kerns, NP                                           Case Manager AEB Ricka Gaskins, CNA    Patient/Guardian was advised Release of Information must be obtained prior to any record release in order to collaborate their care with an outside provider. Patient/Guardian was advised if they have not already done so to contact the registration department to sign all necessary forms in order for us  to release information regarding their care.    Consent: Patient/Guardian gives verbal consent for treatment and assignment of benefits for services provided during this visit. Patient/Guardian expressed understanding and agreed to proceed.   I provided 180 minutes of non-face-to-face time during this encounter.   Darleene Ricker, LCSW, LCAS 07/26/24

## 2024-07-27 ENCOUNTER — Ambulatory Visit: Payer: Self-pay | Admitting: Internal Medicine

## 2024-07-27 ENCOUNTER — Other Ambulatory Visit (HOSPITAL_COMMUNITY): Payer: Self-pay

## 2024-07-27 ENCOUNTER — Telehealth: Payer: Self-pay

## 2024-07-27 ENCOUNTER — Other Ambulatory Visit

## 2024-07-27 ENCOUNTER — Ambulatory Visit (HOSPITAL_COMMUNITY)

## 2024-07-27 ENCOUNTER — Other Ambulatory Visit (HOSPITAL_COMMUNITY): Admitting: Licensed Clinical Social Worker

## 2024-07-27 DIAGNOSIS — E669 Obesity, unspecified: Secondary | ICD-10-CM

## 2024-07-27 DIAGNOSIS — F314 Bipolar disorder, current episode depressed, severe, without psychotic features: Secondary | ICD-10-CM

## 2024-07-27 DIAGNOSIS — R635 Abnormal weight gain: Secondary | ICD-10-CM

## 2024-07-27 NOTE — Progress Notes (Signed)
 Virtual Visit via Video Note   I connected with Samaia Iwata on 07/27/24 at  9:00 AM EDT by a video enabled telemedicine application and verified that I am speaking with the correct person using two identifiers.   At orientation to the IOP program, Case Manager discussed the limitations of evaluation and management by telemedicine and the availability of in person appointments. The patient expressed understanding and agreed to proceed with virtual visits throughout the duration of the program.   Location:  Patient: Patient Home Provider: Home Office   History of Present Illness: Bipolar I Disorder   Observations/Objective: Check In: Case Manager checked in with all participants to review discharge dates, insurance authorizations, work-related documents and needs from the treatment team regarding medications. Ryana stated needs and engaged in discussion.    Initial Therapeutic Activity: Counselor facilitated a check-in with Tamica to assess for safety, sobriety and medication compliance.  Counselor also inquired about Malik's current emotional ratings, as well as any significant changes in thoughts, feelings or behavior since previous check in.  Donis presented for session on time and was alert, oriented x5, with no evidence or self-report of active SI/HI or A/V H.  Lacresha reported compliance with medication and denied use of alcohol or illicit substances.  Madonna reported scores of 2/10 for depression, 2/10 for anxiety, and 0/10 for anger/irritability.  Annina denied any recent outbursts.  Derriona reported that a struggle was feeling very anxious last night and almost having a panic attack.  Chandra reported that a success was getting a pedicure for self-care yesterday.  Ameria reported that her goal this weekend is to spend time playing with her grandson.        Second Therapeutic Activity: Counselor discussed topic of sleep hygiene today with group.  Counselor defined this as the habits, behaviors  and environmental factors that can be adjusted to improve overall sleep quality.  Counselor also discussed how lack of consistent sleep can negatively affect mood and overall mental health.  Counselor provided members with a screening to complete assessing typical barriers one might face in achieving quality sleep at night (i.e. struggling to get up in the morning, waking up throughout the night, tossing/turning, etc), and inquired about members' perception of sleep hygiene at present.  Counselor offered techniques to members via a virtual handout which could be implemented in order to improve sleep hygiene, such as sticking to a normal morning/night routine, avoiding using of electronics too close to bedtime, avoiding heavy meals before bed, using a sleep journal to track changes and address anxious thoughts, as well as avoiding naps during the daytime, ensuring proper use of medications if prescribed any by provider(s), and more.  Counselor inquired about changes members intend to make to sleep hygiene based upon information covered today.  Interventions were effective, as evidenced by [name] actively participating in discussion on subject, reporting that her sleep habits tend to be erratic, as she will sleep soundly for days at a time, but then have nights where she cannot fall asleep.  She reported that Latuda  can be helpful for ensuring that she stays asleep.  Deloris also completed a sleep assessment, which revealed that she is getting a moderate level of sleep at present.  Lillyth reported that she plans to improve sleep hygiene over the course of treatment by sticking to a normal sleep/wake cycle, reading books as a nighttime activity to relax, and avoiding nicotine  use too close to bedtime.     Third Therapeutic Activity: Psycho-educational portion  of group was provided by Lorane Rochester, Interior and spatial designer of community education with [employer].  Alexandra provided information on history of her local agency,  mission statement, and the variety of unique services offered which group members might find beneficial to engage in, including both virtual and in-person support groups, as well as peer support program for mentoring.  Alexandra offered time to answer member's questions regarding services and encouraged them to consider utilizing these services to assist in working towards their individual wellness goals.  Intervention was effective, as evidenced by [name] participating in discussion with speaker on the subject, reporting that she was surprised by the amount of services offered and would consider going on the [employer] website later today in order research more on what might be helpful for her.    Assessment and Plan: Counselor recommends that Ervin remain in IOP treatment to better manage mental health symptoms, ensure stability and pursue completion of treatment plan goals. Counselor recommends adherence to crisis/safety plan, taking medications as prescribed, and following up with medical professionals if any issues arise.    Follow Up Instructions: Counselor will send Microsoft Teams link for session tomorrow.  Jacquelyne was advised to call back or seek an in-person evaluation if the symptoms worsen or if the condition fails to improve as anticipated.   Collaboration of Care:   Medication Management AEB Staci Kerns, NP                                           Case Manager AEB Ricka Gaskins, CNA    Patient/Guardian was advised Release of Information must be obtained prior to any record release in order to collaborate their care with an outside provider. Patient/Guardian was advised if they have not already done so to contact the registration department to sign all necessary forms in order for us  to release information regarding their care.    Consent: Patient/Guardian gives verbal consent for treatment and assignment of benefits for services provided during this visit. Patient/Guardian expressed  understanding and agreed to proceed.   I provided 180 minutes of non-face-to-face time during this encounter.   Darleene Ricker, LCSW, LCAS 07/27/24

## 2024-07-27 NOTE — Telephone Encounter (Signed)
 Pharmacy Patient Advocate Encounter   Received notification from Pt Calls Messages that prior authorization for Zepbound  2.5MG /0.5ML Auto-injectors  is required/requested.   Insurance verification completed.   The patient is insured through [employer] .   Per test claim, medication is not covered due to plan/benefit exclusion, PA not submitted at this time  Patient does not have OSA, We could try Wegovy to reduce the risk of cardiovascular events.SABRA

## 2024-07-28 LAB — 5-HIAA, PLASMA: 5-HIAA, Plasma: 7.9 ng/mL

## 2024-07-28 MED ORDER — WEGOVY 0.25 MG/0.5ML ~~LOC~~ SOAJ: 0.2500 mg | SUBCUTANEOUS | 2 refills | Status: DC

## 2024-07-30 ENCOUNTER — Other Ambulatory Visit (HOSPITAL_COMMUNITY): Payer: Self-pay

## 2024-07-30 ENCOUNTER — Ambulatory Visit (INDEPENDENT_AMBULATORY_CARE_PROVIDER_SITE_OTHER): Admitting: Family Medicine

## 2024-07-30 ENCOUNTER — Encounter: Payer: Self-pay | Admitting: Family Medicine

## 2024-07-30 ENCOUNTER — Telehealth: Payer: Self-pay

## 2024-07-30 ENCOUNTER — Ambulatory Visit (HOSPITAL_COMMUNITY)

## 2024-07-30 ENCOUNTER — Other Ambulatory Visit (HOSPITAL_COMMUNITY): Admitting: Licensed Clinical Social Worker

## 2024-07-30 VITALS — BP 95/66 | HR 95 | Temp 97.7°F | Ht 67.0 in | Wt 231.0 lb

## 2024-07-30 DIAGNOSIS — F314 Bipolar disorder, current episode depressed, severe, without psychotic features: Secondary | ICD-10-CM

## 2024-07-30 DIAGNOSIS — J209 Acute bronchitis, unspecified: Secondary | ICD-10-CM | POA: Diagnosis not present

## 2024-07-30 DIAGNOSIS — R1310 Dysphagia, unspecified: Secondary | ICD-10-CM

## 2024-07-30 DIAGNOSIS — R051 Acute cough: Secondary | ICD-10-CM | POA: Diagnosis not present

## 2024-07-30 LAB — POC COVID19 BINAXNOW: SARS Coronavirus 2 Ag: NEGATIVE

## 2024-07-30 MED ORDER — AZITHROMYCIN 200 MG/5ML PO SUSR: ORAL | 0 refills | Status: AC

## 2024-07-30 NOTE — Patient Instructions (Signed)
 Please follow up if symptoms do not improve or as needed.     VISIT SUMMARY: Today, you were seen for persistent upper respiratory symptoms that have been worsening since they began last Thursday. You have been experiencing congestion, difficulty breathing, headache, sore throat, sinus pain, and a persistent cough. You were exposed to COVID-19 but tested negative. You have been using Mucinex and promethazine  with minimal relief.  YOUR PLAN: -ACUTE UPPER RESPIRATORY TRACT INFECTION: You have an acute upper respiratory tract infection, which is likely caused by a virus. Most upper respiratory infections are viral and do not require antibiotics. However, you have been prescribed a chewable Z-Pak (azithromycin ) to use if your symptoms worsen, you experience significant sinus or dental pain, or if you notice thick mucus. Continue taking Mucinex to help with congestion, stay hydrated, and get plenty of rest. You can exercise as you feel comfortable. To prevent spreading the infection, avoid coughing directly on others, wash your hands frequently, and cough into your arm.  INSTRUCTIONS: If your symptoms worsen or you experience significant sinus or dental pain, or thick mucus, start taking the prescribed chewable Z-Pak (azithromycin ). Continue using Mucinex for congestion relief. Stay hydrated, rest, and exercise as you feel comfortable. Follow the advice on preventing the spread of infection by avoiding direct coughing on others, washing your hands frequently, and coughing into your arm.                      Contains text generated by Abridge.                                 Contains text generated by Abridge.

## 2024-07-30 NOTE — Telephone Encounter (Signed)
 Pharmacy Patient Advocate Encounter   Received notification from Pt Calls Messages that prior authorization for Wegovy 0.25MG /0.5ML Auto-injectors is required/requested.   Insurance verification completed.   The patient is insured through [facility] .   Per test claim: PA required; PA submitted to above mentioned insurance via Latent Key/confirmation #/EOC [id number] Status is pending

## 2024-07-30 NOTE — Progress Notes (Signed)
 Virtual Visit via Video Note   I connected with Tahj Lindseth on 07/30/24 at  9:00 AM EDT by a video enabled telemedicine application and verified that I am speaking with the correct person using two identifiers.   At orientation to the IOP program, Case Manager discussed the limitations of evaluation and management by telemedicine and the availability of in person appointments. The patient expressed understanding and agreed to proceed with virtual visits throughout the duration of the program.   Location:  Patient: Patient Home Provider: Home Office   History of Present Illness: Bipolar I Disorder   Observations/Objective: Check In: Case Manager checked in with all participants to review discharge dates, insurance authorizations, work-related documents and needs from the treatment team regarding medications. Salena stated needs and engaged in discussion.    Initial Therapeutic Activity: Counselor facilitated a check-in with Charmeka to assess for safety, sobriety and medication compliance.  Counselor also inquired about Chavon's current emotional ratings, as well as any significant changes in thoughts, feelings or behavior since previous check in.  Gila presented for session on time and was alert, oriented x5, with no evidence or self-report of active SI/HI or A/V H.  Sayana reported compliance with medication and denied use of alcohol or illicit substances.  Kassity reported scores of 6/10 for depression, 5/10 for anxiety, and 10/10 for anger/irritability.  Marshae denied any recent outbursts or panic attacks.  Deklynn reported that a struggle was having a "Bad weekend", due to getting sick, and having problems with her mother judging her.  Yetta reported that a success has been thinking about setting firmer boundaries with family to protect herself from negative influence.  Karema reported that her goal today is to find something to do as a healthy outlet for her anger in the afternoon.          Second  Therapeutic Activity: Counselor provided psychoeducation on traits of healthy relationships using a handout to guide discussion with members on the subject.  This handout explained how attachments to connections within one's support network (i.e. friends, family, romantic partners, coworkers, Catering manager) can directly influence one's mental health, and emphasized importance of looking for green lights (positive behaviors) that can be considered normal, as well as red lights (harmful behaviors) which suggest that a boundary should be established. Members were tasked with identifying green lights (i.e. respect, trust, appreciation, healthy conflict resolution, etc) and red lights (i.e. contempt, suspicion, impatience, lack of growth, etc) that have been present in past and present relationships, as well as strategies to assertively address these issues before the situation worsens.  Intervention was effective, as evidenced by [name] actively participating in discussion on subject, and identifying several unhealthy traits that her mother has been displaying recently which trigger her anger.  Malin reported that her mother can show contempt, communication aggressively, lacks empathy, and doesn't recognize [place]'s effort in trying to better herself through therapy.  Myrel reported that she will be more assertive and set firmer boundaries to prevent interactions with her mother from sabotaging recovery efforts.   Assessment and Plan: Counselor recommends that Alyene remain in IOP treatment to better manage mental health symptoms, ensure stability and pursue completion of treatment plan goals. Counselor recommends adherence to crisis/safety plan, taking medications as prescribed, and following up with medical professionals if any issues arise.    Follow Up Instructions: Counselor will send Microsoft Teams link for session tomorrow.  Miri was advised to call back or seek an in-person evaluation if the symptoms worsen  or if the  condition fails to improve as anticipated.   Collaboration of Care:   Medication Management AEB Staci Kerns, NP                                           Case Manager AEB Ricka Gaskins, CNA    Patient/Guardian was advised Release of Information must be obtained prior to any record release in order to collaborate their care with an outside provider. Patient/Guardian was advised if they have not already done so to contact the registration department to sign all necessary forms in order for us  to release information regarding their care.    Consent: Patient/Guardian gives verbal consent for treatment and assignment of benefits for services provided during this visit. Patient/Guardian expressed understanding and agreed to proceed.   I provided 180 minutes of non-face-to-face time during this encounter.   Darleene Ricker, LCSW, LCAS 07/30/24

## 2024-07-30 NOTE — Progress Notes (Signed)
 Subjective  CC:  Chief Complaint  Patient presents with   URI    Pt stated that she was Dx with an URI and was tested for COVID and came back negative. She was prescribed an inhaler but she stated that she does not want to use it .   Same day acute visit; PCP not available. New pt to me. Chart reviewed.   HPI: Gina [name] is a 48 y.o. female who presents to the office today to address the problems listed above in the chief complaint. Discussed the use of AI scribe software for clinical note transcription with the patient, who gave verbal consent to proceed.  I reviewed viewed medical record including problem list, recent office visits, recent urgent care visit, recent chest x-ray report. History of Present Illness Gina [name] is a 48 year old female who presents with persistent upper respiratory symptoms.  Upper respiratory symptoms - Onset of symptoms Thursday prior to visit - Worsening symptoms since onset - Congestion and difficulty expectorating phlegm - Headache and sore throat - Sinus pain and head congestion - Altered voice - No history of asthma or chronic lung disease - No abnormalities on chest x-ray performed at urgent care  Cough - Persistent cough attributed to congestion - Minimal relief with promethazine  - No significant improvement with Mucinex  Covid-19 and infectious exposure - Exposure to COVID-19 through grandson - Negative COVID-19 test on Friday prior to visit - Inquires about contagiousness  Medication and symptom management - Regular use of Mucinex with slight relief - Use of promethazine  for symptom relief with minimal effect on cough - No recent antibiotic use  Lifestyle factors - Vapes but does not smoke cigarettes   Assessment  1. Acute bronchitis, unspecified organism   2. Acute cough   3. Difficulty swallowing pills      Plan  Assessment and Plan Assessment & Plan Acute upper respiratory tract infection Likely  viral etiology. COVID-19 negative. Differential includes viral bronchitis and bacterial infection. Most upper respiratory infections are viral. - Prescribed liquid Z-Pak (azithromycin ) for use if symptoms worsen or significant sinus/dental pain or thick mucus occurs. - Continue Mucinex for congestion. - Encouraged hydration and rest. - Advised exercise based on comfort. - Educated on contagiousness: avoid direct coughing on others, frequent hand washing, cough into arm.   Follow up: As needed Orders Placed This Encounter  Procedures   POC COVID-19   No orders of the defined types were placed in this encounter.    I reviewed the patients updated PMH, FH, and SocHx.  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   Status post cholecystectomy 07/20/2024   Hepatic steatosis 07/20/2024   Flushing 07/20/2024   Allodynia 07/20/2024   Low testosterone 07/20/2024   Fibromyalgia 07/20/2024   Intolerance, drug 07/20/2024   Cushing's syndrome 07/18/2024   Bipolar 1 disorder, depressed, severe (HCC) 07/04/2024   Arthritis of left acromioclavicular joint 04/14/2024   Chronic pain in left shoulder 11/01/2023   Calcific supraspinatus tendonitis 10/03/2023   Bursitis of right shoulder 10/03/2023   Arthritis of right acromioclavicular joint 10/03/2023   S/P arthroscopy of right shoulder 09/27/2023   Persistent headaches 01/18/2023   Blurry vision 01/18/2023   Weight gain 01/18/2023   SBO (small bowel obstruction) (HCC) 09/13/2022   Nausea and vomiting 09/11/2022   Partial small bowel obstruction (HCC) 09/10/2022   Piriformis syndrome of both sides 05/31/2019   History of bipolar disorder 04/18/2019  Tardive dyskinesia 04/18/2019   Urticaria due to drug allergy 04/18/2019   Lumbar spondylosis 02/05/2019   Bipolar affective disorder, current episode mixed (HCC) 01/02/2019   Arthritis 01/02/2019   Cigarette nicotine  dependence without  complication 01/02/2019   Bilateral temporomandibular joint pain 10/02/2018   Referred otalgia, bilateral 10/02/2018   Paresthesias 03/09/2018   Chronic migraine without aura, intractable, with status migrainosus 04/22/2016   Bipolar 1 disorder, mixed, moderate (HCC) 07/15/2015    Class: Chronic   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   Sciatica 01/09/2015   Reactive hypoglycemia 12/17/2013   Musculoskeletal malfunction arising from mental factors 04/04/2013   Incisional hernia 11/15/2012   Other postprocedural status(V45.89) 11/15/2012   S/P hernia surgery 11/15/2012   Abdominal pain 11/11/2012   Chronic pain syndrome 11/11/2012   Disorder of sacrum 11/11/2012   Chronic pain 12/21/2011   Heartburn 10/04/2011   DYSPHAGIA 10/04/2011   Depression with anxiety 09/15/2010   CONSTIPATION 09/15/2010   LUNG NODULE 06/17/2010   Herpes simplex virus (HSV) infection 06/11/2010   Fatigue 06/11/2010   STRICTURE AND STENOSIS OF ESOPHAGUS 05/13/2010   ABDOMINAL WALL HERNIA 02/27/2010   ENDOMETRIOSIS 12/16/2009   GERD 10/23/2009   PALPITATIONS 10/23/2009   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   Cholecalciferol (VITAMIN D3 PO) Take 2 each by mouth daily. Gummy   cholestyramine  (QUESTRAN ) 4 g packet Take 1 packet (4 g total) by mouth 3 (three) times daily with meals.   diazepam  (VALIUM ) 5 MG tablet Take 1 tablet (5 mg total) by mouth 2 (two) times  daily as needed for muscle spasms.   EC-RX Testosterone 0.2 % CREA Place onto the skin.   escitalopram  (LEXAPRO ) 10 MG tablet Take 10 mg by mouth daily.   hyoscyamine  (LEVSIN  SL) 0.125 MG SL tablet Place 0.125 mg under the tongue every 4 (four) hours as needed for cramping.   LORazepam  (ATIVAN ) 1 MG tablet Take 1 mg by mouth every 8 (eight) hours as needed for anxiety.   lurasidone  (LATUDA ) 40 MG TABS tablet Take 40 mg by mouth every evening.   methocarbamol  (ROBAXIN ) 500 MG tablet TAKE 1 TABLET BY MOUTH EVERY 8 HOURS AS NEEDED FOR UP TO 7 DAYS   oxyCODONE -acetaminophen  (PERCOCET/ROXICET) 5-325 MG tablet Take 1 tablet by mouth every 4 (four) hours as needed for severe pain (pain score 7-10).   Oyster Shell (OYSTER CALCIUM) 500 MG TABS tablet Take 500 mg of elemental calcium by mouth daily.   PRESCRIPTION MEDICATION Apply 1-2 Applications topically at bedtime. Testosterone 4% cream   promethazine  (PHENERGAN ) 25 MG tablet Take 25 mg by mouth every 6 (six) hours as needed for nausea or vomiting.   semaglutide-weight management (WEGOVY) 0.25 MG/0.5ML SOAJ SQ injection Inject 0.25 mg into the skin once a week.   Suzetrigine  (JOURNAVX ) 50 MG TABS Take 1 tablet by mouth 2 (two) times daily as needed.   tirzepatide  (ZEPBOUND ) 2.5 MG/0.5ML Pen Inject 2.5 mg into the skin once a week.  Allergies: Patient is allergic to lamictal [lamotrigine], maxalt  [rizatriptan ], morphine  and codeine , nicotine , wellbutrin  [bupropion ], zofran , chantix  [varenicline  tartrate], ciprofloxacin , codeine , haldol  [haloperidol ], ibuprofen, lidoderm  [lidocaine ], skelaxin [metaxalone], sulfa antibiotics, sulfasalazine, toradol  [ketorolac  tromethamine ], ambien [zolpidem], doxycycline , duloxetine, ingrezza [valbenazine tosylate], lyrica [pregabalin], methadone, neurontin  [gabapentin ], other, prednisone , amoxicillin, aripiprazole, chlorhexidine , penicillins, and tramadol . Family History: Patient family history includes Arthritis in her  mother; Cancer in her father; Depression in her mother; Diabetes in her father; Heart attack in her father; Heart disease in her father; Hyperlipidemia in her father; Hypertension in her father; Stroke in her father. Social History:  Patient  reports that she has quit smoking. Her smoking use included cigarettes. She has never used smokeless tobacco. She reports that she does not drink alcohol and does not use drugs.  Review of Systems: Constitutional: Negative for fever malaise or anorexia Cardiovascular: negative for chest pain Respiratory: negative for SOB or persistent cough Gastrointestinal: negative for abdominal pain  Objective  Vitals: BP 95/66   Pulse 95   Temp 97.7 F (36.5 C)   Ht 5' 7 (1.702 m)   Wt 231 lb (104.8 kg) Comment: pt reported  LMP 01/01/2004   SpO2 97%   BMI 36.18 kg/m  General: no acute distress , A&Ox3, no respiratory distress Psych: Flat affect HEENT: PEERL, conjunctiva normal, neck is supple, no cervical Behn apathy Cardiovascular:  RRR without murmur or gallop.  Respiratory:  Good breath sounds bilaterally, CTAB with normal respiratory effort Skin:  Warm, no rashes  Office Visit on 07/30/2024  Component Date Value Ref Range Status   SARS Coronavirus 2 Ag 07/30/2024 Negative  Negative Final    Commons side effects, risks, benefits, and alternatives for medications and treatment plan prescribed today were discussed, and the patient expressed understanding of the given instructions. Patient is instructed to call or message via MyChart if he/she has any questions or concerns regarding our treatment plan. No barriers to understanding were identified. We discussed Red Flag symptoms and signs in detail. Patient expressed understanding regarding what to do in case of urgent or emergency type symptoms.  Medication list was reconciled, printed and provided to the patient in AVS. Patient instructions and summary information was reviewed with the patient as  documented in the AVS. This note was prepared with assistance of Dragon voice recognition software. Occasional wrong-word or sound-a-like substitutions may have occurred due to the inherent limitations of voice recognition software

## 2024-07-31 ENCOUNTER — Telehealth (HOSPITAL_COMMUNITY): Payer: Self-pay | Admitting: Psychiatry

## 2024-07-31 ENCOUNTER — Other Ambulatory Visit (HOSPITAL_COMMUNITY): Admitting: Psychiatry

## 2024-07-31 ENCOUNTER — Ambulatory Visit (HOSPITAL_COMMUNITY)

## 2024-07-31 ENCOUNTER — Other Ambulatory Visit (HOSPITAL_COMMUNITY): Payer: Self-pay

## 2024-07-31 ENCOUNTER — Telehealth: Payer: Self-pay

## 2024-07-31 NOTE — Telephone Encounter (Signed)
 Pharmacy Patient Advocate Encounter  Received notification from [employer] that Prior Authorization for [name] 0.25MG /0.5ML Auto-injectors  has been DENIED.  Full denial letter will be uploaded to the media tab. See denial reason below.    PA #/Case ID/Reference #: [id number]

## 2024-07-31 NOTE — Telephone Encounter (Signed)
 D:  Pt didn't log into virtual MH-IOP today.  A:  Placed call to pt.  Pt sounded like she was still in bed.  Reports she needs to rest today therefore wouldn't be attending group today.  Denies SI/HI or A/V hallucinations.  Inform treatment team.  R:  Pt receptive.

## 2024-07-31 NOTE — Telephone Encounter (Signed)
 Pharmacy Patient Advocate Encounter  Received notification from [employer] that Prior Authorization for Journavx  50MG  tablets  has been DENIED.  Full denial letter will be uploaded to the media tab. See denial reason below.    PA #/Case ID/Reference #: [id number]

## 2024-07-31 NOTE — Telephone Encounter (Signed)
 Pharmacy Patient Advocate Encounter   Received notification from Pt Calls Messages that prior authorization for Journavx  50MG  tablets is required/requested.   Insurance verification completed.   The patient is insured through [facility] .   Per test claim: PA required; PA submitted to above mentioned insurance via Latent Key/confirmation #/EOC [id number] Status is pending

## 2024-08-01 ENCOUNTER — Ambulatory Visit (HOSPITAL_COMMUNITY)

## 2024-08-01 ENCOUNTER — Other Ambulatory Visit (HOSPITAL_COMMUNITY): Attending: Psychiatry | Admitting: Licensed Clinical Social Worker

## 2024-08-01 DIAGNOSIS — F314 Bipolar disorder, current episode depressed, severe, without psychotic features: Secondary | ICD-10-CM | POA: Diagnosis present

## 2024-08-01 NOTE — Progress Notes (Signed)
 Virtual Visit via Video Note   I connected with Gina [name] on 08/01/24 at  9:00 AM EDT by a video enabled telemedicine application and verified that I am speaking with the correct person using two identifiers.   At orientation to the IOP program, Case Manager discussed the limitations of evaluation and management by telemedicine and the availability of in person appointments. The patient expressed understanding and agreed to proceed with virtual visits throughout the duration of the program.   Location:  Patient: Patient Home Provider: [facility]   History of Present Illness: Bipolar I Disorder     Observations/Objective: Check In: Case Manager checked in with all participants to review discharge dates, insurance authorizations, work-related documents and needs from the treatment team regarding medications. Gina [name] stated needs and engaged in discussion.    Initial Therapeutic Activity: Counselor facilitated a check-in with Gina [name] to assess for safety, sobriety and medication compliance.  Counselor also inquired about Gina [name]'s current emotional ratings, as well as any significant changes in thoughts, feelings or behavior since previous check in.  Gina [name] presented for session on time and was alert, oriented x5, with no evidence or self-report of active SI/HI or A/V H.  Gina [name] reported compliance with medication and denied use of alcohol or illicit substances.  Gina [name] reported scores of 6/10 for depression, 5/10 for anxiety, and 5/10 for anger/irritability.  Gina [name] denied any recent outbursts or panic attacks.  Gina [name] reported that a struggle has been dealing with symptoms of bronchitis.  Gina [name] reported that her goal today is to start working on an exercise plan to get in better shape.          Second Therapeutic Activity: Counselor introduced Gina [name], [facility] Pharmacist, to provide psychoeducation on topic of medication compliance with members today.  Gina provided psychoeducation on classes of  medications such as antidepressants, antipsychotics, what symptoms they are intended to treat, and any side effects one might encounter while on a particular prescription.  Time was allowed for clients to ask any questions they might have of [facility] regarding this specialty.  Intervention effectiveness could not be measured, as client did not participate.    Third Therapeutic Activity: Counselor offered to teach group members an ACT relaxation technique today to aid in managing difficult thoughts, feelings, urges, and sensations.  Counselor guided members through process of getting comfortable, achieving relaxing breathing rhythm, and then maintaining this throughout activity.  Counselor invited members to imagine a gently flowing stream in their mind with leaves floating upon it, and when any thoughts, feelings, urges, or sensations arose, good or bad, they were instructed to visualize placing them on these passing leaves over course of 15 minutes practice.  Intervention effectiveness could not be measured, as Gina [name] reported that due to not feeling well, she wished to be excused from second half of session.  Counselor permitted this, and informed care team of her early departure from today's group.   Assessment and Plan: Counselor recommends that Gina [name] remain in IOP treatment to better manage mental health symptoms, ensure stability and pursue completion of treatment plan goals. Counselor recommends adherence to crisis/safety plan, taking medications as prescribed, and following up with medical professionals if any issues arise.    Follow Up Instructions: Counselor will send Microsoft Teams link for session tomorrow.  Gina [name] was advised to call back or seek an in-person evaluation if the symptoms worsen or if the condition fails to improve as anticipated.   Collaboration of Care:   Medication Management AEB  Staci Kerns, NP                                           Case Manager AEB Ricka Gaskins, CNA     Patient/Guardian was advised Release of Information must be obtained prior to any record release in order to collaborate their care with an outside provider. Patient/Guardian was advised if they have not already done so to contact the registration department to sign all necessary forms in order for us  to release information regarding their care.    Consent: Patient/Guardian gives verbal consent for treatment and assignment of benefits for services provided during this visit. Patient/Guardian expressed understanding and agreed to proceed.   I provided 90 minutes of non-face-to-face time during this encounter.   Darleene Ricker, LCSW, LCAS 08/01/24

## 2024-08-02 ENCOUNTER — Ambulatory Visit (HOSPITAL_COMMUNITY)

## 2024-08-02 ENCOUNTER — Other Ambulatory Visit (HOSPITAL_COMMUNITY): Admitting: Psychiatry

## 2024-08-02 DIAGNOSIS — F314 Bipolar disorder, current episode depressed, severe, without psychotic features: Secondary | ICD-10-CM | POA: Diagnosis not present

## 2024-08-02 LAB — CORTISOL, URINE, 24 HOUR
24 Hour urine volume (VMAHVA): 2500 mL
CREATININE, URINE: 1.3 g/(24.h) (ref 0.50–2.15)
Cortisol (Ur), Free: 27.5 ug/(24.h) (ref 4.0–50.0)

## 2024-08-02 LAB — URINALYSIS W MICROSCOPIC + REFLEX CULTURE
Bacteria, UA: NONE SEEN /HPF
Bilirubin Urine: NEGATIVE
Glucose, UA: NEGATIVE
Hgb urine dipstick: NEGATIVE
Hyaline Cast: NONE SEEN /LPF
Ketones, ur: NEGATIVE
Leukocyte Esterase: NEGATIVE
Nitrites, Initial: NEGATIVE
Protein, ur: NEGATIVE
RBC / HPF: NONE SEEN /HPF (ref 0–2)
Specific Gravity, Urine: 1.01 (ref 1.001–1.035)
WBC, UA: NONE SEEN /HPF (ref 0–5)
pH: 6.5 (ref 5.0–8.0)

## 2024-08-02 LAB — NO CULTURE INDICATED

## 2024-08-02 NOTE — Progress Notes (Signed)
 Virtual Visit via Video Note   I connected with Frannie Shedrick on 08/02/24 at  9:00 AM EDT by a video enabled telemedicine application and verified that I am speaking with the correct person using two identifiers.   At orientation to the IOP program, Case Manager discussed the limitations of evaluation and management by telemedicine and the availability of in person appointments. The patient expressed understanding and agreed to proceed with virtual visits throughout the duration of the program.   Location:  Patient: Patient Home Provider: [facility]   History of Present Illness: Bipolar I Disorder   Observations/Objective: Check In: Case Manager checked in with all participants to review discharge dates, insurance authorizations, work-related documents and needs from the treatment team regarding medications. Natacia stated needs and engaged in discussion.    Initial Therapeutic Activity: Counselor facilitated a check-in with Tonica to assess for safety, sobriety and medication compliance.  Counselor also inquired about Anicia's current emotional ratings, as well as any significant changes in thoughts, feelings or behavior since previous check in.  Tehani presented for session on time and was alert, oriented x5, with no evidence or self-report of active SI/HI or A/V H.  Janayla reported compliance with medication and denied use of alcohol or illicit substances.  Naomy reported scores of 5/10 for depression, 2/10 for anxiety, and 0/10 for anger/irritability.  Shivali denied any recent outbursts or panic attacks.  Onesha reported that a struggle was having bronchitis symptoms yesterday and taking OTC medication and resting to cope with this.  Myrtice reported that a success was helping out her husband, who had a flat tire.  Eren reported that her goal today is to take a shower and hang out with her grandson.          Second Therapeutic Activity: Counselor introduced topic of creating mental health  maintenance plan today.  Counselor provided handout on subject to members, which stressed the importance of maintaining one's mental health in a similar way to using diet and exercise to ensure physical health.  Counselor walked members through process of identifying triggers which could worsen symptoms, including specific people, places, and things one needs to avoid.  Members were also tasked with identifying warning signs such as thoughts, feelings, or behaviors which could indicate mental health is at increased risk.  Counselor also facilitated conversation on self-care activities and coping strategies which members have previously utilized in the past, are currently using in daily routine, or plan to use soon to assist with managing problems or symptoms when/if they appear.  Counselor encouraged members to revisit their maintenance plan often and make changes as needed to ensure day to day stability.  Intervention was effective, as evidenced by [name] participating in activity and creating a comprehensive plan, including identification of triggers such as driving near traffic cones, big trucks, or stressful interactions with her mother.  Lowell also reported that she would make an effort to set aside time for self-care activities such as spending time in nature, and use coping skills such as deep breathing or assertive communication in order to set healthier boundaries with family that trigger her.    Assessment and Plan: Counselor recommends that Anabelen remain in IOP treatment to better manage mental health symptoms, ensure stability and pursue completion of treatment plan goals. Counselor recommends adherence to crisis/safety plan, taking medications as prescribed, and following up with medical professionals if any issues arise.    Follow Up Instructions: Counselor will send Microsoft Teams link for session  tomorrow.  Danaja was advised to call back or seek an in-person evaluation if the symptoms worsen or  if the condition fails to improve as anticipated.   Collaboration of Care:   Medication Management AEB Staci Kerns, NP                                           Case Manager AEB Ricka Gaskins, CNA    Patient/Guardian was advised Release of Information must be obtained prior to any record release in order to collaborate their care with an outside provider. Patient/Guardian was advised if they have not already done so to contact the registration department to sign all necessary forms in order for us  to release information regarding their care.    Consent: Patient/Guardian gives verbal consent for treatment and assignment of benefits for services provided during this visit. Patient/Guardian expressed understanding and agreed to proceed.   I provided 180 minutes of non-face-to-face time during this encounter.   Darleene Ricker, LCSW, LCAS 08/02/24

## 2024-08-03 ENCOUNTER — Ambulatory Visit (HOSPITAL_COMMUNITY)

## 2024-08-03 ENCOUNTER — Other Ambulatory Visit (HOSPITAL_COMMUNITY): Admitting: Psychiatry

## 2024-08-03 DIAGNOSIS — F314 Bipolar disorder, current episode depressed, severe, without psychotic features: Secondary | ICD-10-CM

## 2024-08-03 NOTE — Progress Notes (Signed)
 Virtual Visit via Video Note   I connected with Gina [name] on 08/03/24 at  9:00 AM EDT by a video enabled telemedicine application and verified that I am speaking with the correct person using two identifiers.   At orientation to the IOP program, Case Manager discussed the limitations of evaluation and management by telemedicine and the availability of in person appointments. The patient expressed understanding and agreed to proceed with virtual visits throughout the duration of the program.   Location:  Patient: Patient Home Provider: Home Office   History of Present Illness: Bipolar I Disorder   Observations/Objective: Check In: Case Manager checked in with all participants to review discharge dates, insurance authorizations, work-related documents and needs from the treatment team regarding medications. Gina [name] stated needs and engaged in discussion.    Initial Therapeutic Activity: Counselor facilitated a check-in with Gina [name] to assess for safety, sobriety and medication compliance.  Counselor also inquired about Gina [name]'s current emotional ratings, as well as any significant changes in thoughts, feelings or behavior since previous check in.  Gina [name] presented for session on time and was alert, oriented x5, with no evidence or self-report of active SI/HI or A/V H.  Gina [name] reported compliance with medication and denied use of alcohol or illicit substances.  Gina [name] reported scores of 0/10 for depression, 0/10 for anxiety, and 0/10 for anger/irritability.  Gina [name] denied any recent outbursts or panic attacks.  Gina [name] reported that a struggle was sleeping poorly last night due to symptoms of bronchitis.  Gina [name] reported that a success was showing up today to try and stay engaged.  Gina [name] reported that her goal today is to spend time with her grandson if she starts feeling better.          Second Therapeutic Activity: Counselor introduced topic of grounding skills today.  Counselor defined these as simple  strategies one can use to help detach from difficult thoughts or feelings temporarily by focusing on something else.  Counselor noted that grounding will not solve the problem at hand, but can provide the practitioner with time to regain control over their thoughts and/or feelings and prevent the situation from getting worse (i.e. interrupting a panic attack).  Counselor divided these into three categories (mental, physical, and soothing) and then provided examples of each which group members could practice during session.  Some of these included describing one's environment in detail or playing a categories game with oneself for mental category, taking a hot bath/shower, stretching, or carrying a grounding object for physical category, and saying kind statements, or visualizing people one cares about for soothing category.  Counselor inquired about which techniques members have used with success in the past, or will commit to learning, practicing, and applying now to improve coping abilities.  Intervention effectiveness could not be measured, as Gina [name] requested to leave at breaktime due to not feeling well.  Counselor excused her, and informed care team of early departure from session.    Assessment and Plan: Counselor recommends that Gina [name] remain in IOP treatment to better manage mental health symptoms, ensure stability and pursue completion of treatment plan goals. Counselor recommends adherence to crisis/safety plan, taking medications as prescribed, and following up with medical professionals if any issues arise.    Follow Up Instructions: Counselor will send Microsoft Teams link for session tomorrow.  Gina [name] was advised to call back or seek an in-person evaluation if the symptoms worsen or if the condition fails to improve as anticipated.   Collaboration of Care:   Medication  Management AEB Staci Kerns, NP                                           Case Manager AEB Ricka Gaskins, CNA     Patient/Guardian was advised Release of Information must be obtained prior to any record release in order to collaborate their care with an outside provider. Patient/Guardian was advised if they have not already done so to contact the registration department to sign all necessary forms in order for us  to release information regarding their care.    Consent: Patient/Guardian gives verbal consent for treatment and assignment of benefits for services provided during this visit. Patient/Guardian expressed understanding and agreed to proceed.   I provided 90 minutes of non-face-to-face time during this encounter.   Darleene Ricker, LCSW, LCAS 08/03/24

## 2024-08-05 LAB — SALIVARY CORTISOL X4, TIMED
Salivary Cortisol 2nd Specimen: 0.058 ug/dL
Salivary Cortisol 4th Specimen: 0.161 ug/dL
Salivary Cortisol Baseline: 0.392 ug/dL

## 2024-08-06 ENCOUNTER — Ambulatory Visit (HOSPITAL_COMMUNITY)

## 2024-08-06 ENCOUNTER — Other Ambulatory Visit (HOSPITAL_COMMUNITY): Admitting: Licensed Clinical Social Worker

## 2024-08-06 DIAGNOSIS — F314 Bipolar disorder, current episode depressed, severe, without psychotic features: Secondary | ICD-10-CM

## 2024-08-06 NOTE — Progress Notes (Signed)
 Virtual Visit via Video Note   I connected with Keashia Haskins on 08/06/24 at  9:00 AM EDT by a video enabled telemedicine application and verified that I am speaking with the correct person using two identifiers.   At orientation to the IOP program, Case Manager discussed the limitations of evaluation and management by telemedicine and the availability of in person appointments. The patient expressed understanding and agreed to proceed with virtual visits throughout the duration of the program.   Location:  Patient: Patient Home Provider: Home Office   History of Present Illness: Bipolar I Disorder    Observations/Objective: Check In: Case Manager checked in with all participants to review discharge dates, insurance authorizations, work-related documents and needs from the treatment team regarding medications. Rhetta stated needs and engaged in discussion.    Initial Therapeutic Activity: Counselor facilitated a check-in with Fate to assess for safety, sobriety and medication compliance.  Counselor also inquired about Brentney's current emotional ratings, as well as any significant changes in thoughts, feelings or behavior since previous check in.  Nakeitha presented for session on time and was alert, oriented x5, with no evidence or self-report of active SI/HI or A/V H.  Treniece reported compliance with medication and denied use of alcohol or illicit substances.  Dorcus reported scores of 10/10 for depression, 10/10 for anxiety, and 5/10 for anger.  Maegen denied any recent outbursts or panic attacks.  Timera reported that a success was attending physical therapy this morning.  Shy reported that a struggle was having a friend pass away unexpectedly and finding out via social media today.  She reported that her weekend was also tough, as she suspects her husband may have some form of relationship with another woman.  Iracema reported that her goal today is to go to a park with a friend to talk about recent  challenges.            Second Therapeutic Activity: Counselor introduced Amanda Davee Lomax, [facility] Chaplain to provide psychoeducation on topic of Grief and Loss with members today.  Alan began discussion by checking in with the group about their baseline mood today, general thoughts on what grief means to them and how it has affected them personally in the past.  Alan provided information on how the process of grief/loss can differ depending upon one's unique culture, and categories of loss one could experience (i.e. loss of a person, animal, relationship, job, identity, etc).  Alan encouraged members to be mindful of how pervasive loss can be, and how to recognize signs which could indicate that this is having an impact on one's overall mental health and wellbeing.  Intervention was effective, as evidenced by [name] participating in discussion with speaker on the subject, reporting that she has lost several people dear to her in recent years, including her best friend, ex and a friend that she was informed about just this morning.  Tynetta reported that she would frequently have dreams of the people that have passed away, and recognized that if ignored, this could impact chances of success in therapy, stating "I can't let this sink me.  I'm in a very crucial time right now".    Third Therapeutic Activity: Counselor discussed topic of gratitude journaling with members as a form of self-care.  Counselor virtually shared a handout with the group today which explained the benefits of this practice, including reduction in stress, increased happiness, and self-esteem.  Tips were also provided to aid in practice, such as taking time  with entries, writing about people one is grateful for, and setting goal for two entries per week for at least 10-20 minutes at a time.  Counselor also provided group members with a variety of journaling prompts to choose from today, and encouraged each member to take time to write  about something they are grateful for, with examples such as "Something beautiful I recently saw was..", "Something I can be proud of is.", "A reason to be excited for the future is." and more.  Members were encouraged to share their entry with the group, along with their perspective on the activity and motivation level towards making this a habit. Intervention was effective, as evidenced by [name] participating in journaling activity, and choosing the prompt "An act of kindness I witnessed or received".  Ova expressed gratitude for a close friend helping with a donation 10 years ago so that she could extend her stay in a mental health treatment center.    Assessment and Plan: Counselor recommends that Deann remain in IOP treatment to better manage mental health symptoms, ensure stability and pursue completion of treatment plan goals. Counselor recommends adherence to crisis/safety plan, taking medications as prescribed, and following up with medical professionals if any issues arise.    Follow Up Instructions: Counselor will send [employer] Teams link for session tomorrow.  Deniss was advised to call back or seek an in-person evaluation if the symptoms worsen or if the condition fails to improve as anticipated.   Collaboration of Care:   Medication Management AEB Staci Kerns, NP                                           Case Manager AEB Ricka Gaskins, CNA    Patient/Guardian was advised Release of Information must be obtained prior to any record release in order to collaborate their care with an outside provider. Patient/Guardian was advised if they have not already done so to contact the registration department to sign all necessary forms in order for us  to release information regarding their care.    Consent: Patient/Guardian gives verbal consent for treatment and assignment of benefits for services provided during this visit. Patient/Guardian expressed understanding and agreed to proceed.   I  provided 180 minutes of non-face-to-face time during this encounter.   Darleene Ricker, LCSW, LCAS 08/06/24

## 2024-08-07 ENCOUNTER — Telehealth (HOSPITAL_COMMUNITY): Payer: Self-pay | Admitting: Psychiatry

## 2024-08-07 ENCOUNTER — Ambulatory Visit (HOSPITAL_COMMUNITY)

## 2024-08-07 ENCOUNTER — Other Ambulatory Visit (HOSPITAL_COMMUNITY): Admitting: Psychiatry

## 2024-08-07 DIAGNOSIS — F314 Bipolar disorder, current episode depressed, severe, without psychotic features: Secondary | ICD-10-CM | POA: Diagnosis not present

## 2024-08-07 NOTE — Telephone Encounter (Signed)
 D:  According to Darleene Ricker, LCSW (MH-IOP Group Facilitator); pt didn't return to group after break.  A:  Placed call to check on pt.  According to pt, she accidentally logged off.  I tried to log back on but no one would ever let me back in.   Pt will be excused from group tomorrow d/t a scheduled appt.  Will inform treatment team.  R:  Pt receptive.

## 2024-08-07 NOTE — Progress Notes (Signed)
 Virtual Visit via Video Note   I connected with Gina [name] on 08/07/24 at  9:00 AM EDT by a video enabled telemedicine application and verified that I am speaking with the correct person using two identifiers.   At orientation to the IOP program, Case Manager discussed the limitations of evaluation and management by telemedicine and the availability of in person appointments. The patient expressed understanding and agreed to proceed with virtual visits throughout the duration of the program.   Location:  Patient: Patient Home Provider: Home Office   History of Present Illness: Bipolar I Disorder      Observations/Objective: Check In: Case Manager checked in with all participants to review discharge dates, insurance authorizations, work-related documents and needs from the treatment team regarding medications. Gina [name] stated needs and engaged in discussion.    Initial Therapeutic Activity: Counselor facilitated a check-in with Gina [name] to assess for safety, sobriety and medication compliance.  Counselor also inquired about Gina [name]'s current emotional ratings, as well as any significant changes in thoughts, feelings or behavior since previous check in.  Gina [name] presented for session on time and was alert, oriented x5, with no evidence or self-report of active SI/HI or A/V H.  Gina [name] reported compliance with medication and denied use of alcohol or illicit substances.  Gina [name] reported scores of 5/10 for depression, 7/10 for anxiety, and 10/10 for anger/irritability.  Gina [name] denied any recent outburst.  Gina [name] reported that a struggle was having a panic attack yesterday when driving to someone's house.  Gina [name] reported that a success was managing to buy a gift for her daughter while she was out.  Gina [name] reported that her goal today is to spend time with her father for support.          Second Therapeutic Activity: Counselor introduced topic of self-care today.  Counselor explained how this can be defined as  the things one does to maintain good health and improve well-being.  Counselor provided members with a self-care assessment form to complete.  This handout featured various sub-categories of self-care, including physical, psychological/emotional, social, spiritual, and professional.  Members were asked to rank their engagement in the activities listed for each dimension on a scale of 1-3, with 1 indicating 'Poor', 2 indicating 'Ok', and 3 indicating 'Well'.  Counselor invited members to share results of their assessment, and inquired about which areas of self-care they are doing well in, as well as areas that require attention, and how they plan to begin addressing this during treatment.  Intervention effectiveness could not be measured, as Gina [name] did not log back into session after break time.  Counselor informed care team of her absence for outreach assistance.   Assessment and Plan: Counselor recommends that Gina [name] remain in IOP treatment to better manage mental health symptoms, ensure stability and pursue completion of treatment plan goals. Counselor recommends adherence to crisis/safety plan, taking medications as prescribed, and following up with medical professionals if any issues arise.    Follow Up Instructions: Counselor will send [employer] Teams link for session tomorrow.  Gina [name] was advised to call back or seek an in-person evaluation if the symptoms worsen or if the condition fails to improve as anticipated.   Collaboration of Care:   Medication Management AEB Staci Kerns, NP  Case Manager AEB Ricka Gaskins, CNA    Patient/Guardian was advised Release of Information must be obtained prior to any record release in order to collaborate their care with an outside provider. Patient/Guardian was advised if they have not already done so to contact the registration department to sign all necessary forms in order for us  to release information regarding their care.     Consent: Patient/Guardian gives verbal consent for treatment and assignment of benefits for services provided during this visit. Patient/Guardian expressed understanding and agreed to proceed.   I provided 90 minutes of non-face-to-face time during this encounter.   Darleene Ricker, LCSW, LCAS 08/07/24

## 2024-08-08 ENCOUNTER — Ambulatory Visit (HOSPITAL_COMMUNITY)

## 2024-08-08 ENCOUNTER — Other Ambulatory Visit (HOSPITAL_COMMUNITY)

## 2024-08-08 NOTE — Addendum Note (Signed)
 Addended by: Javon Snee G on: 08/08/2024 08:43 PM   Modules accepted: Orders

## 2024-08-09 ENCOUNTER — Ambulatory Visit (HOSPITAL_COMMUNITY)

## 2024-08-09 ENCOUNTER — Other Ambulatory Visit (HOSPITAL_COMMUNITY): Admitting: Licensed Clinical Social Worker

## 2024-08-09 DIAGNOSIS — F314 Bipolar disorder, current episode depressed, severe, without psychotic features: Secondary | ICD-10-CM | POA: Diagnosis not present

## 2024-08-09 NOTE — Progress Notes (Signed)
 Virtual Visit via Video Note   I connected with Gina [name] on 08/09/24 at  9:00 AM EDT by a video enabled telemedicine application and verified that I am speaking with the correct person using two identifiers.   At orientation to the IOP program, Case Manager discussed the limitations of evaluation and management by telemedicine and the availability of in person appointments. The patient expressed understanding and agreed to proceed with virtual visits throughout the duration of the program.   Location:  Patient: [facility] Home Provider: [facility]   History of Present Illness: Bipolar I Disorder   Observations/Objective: Check In: Case Manager checked in with all participants to review discharge dates, insurance authorizations, work-related documents and needs from the treatment team regarding medications. Gina [name] stated needs and engaged in discussion.    Initial Therapeutic Activity: Counselor facilitated a check-in with Gina [name] to assess for safety, sobriety and medication compliance.  Counselor also inquired about Gina [name]'s current emotional ratings, as well as any significant changes in thoughts, feelings or behavior since previous check in.  Gina [name] presented for session on time and was alert, oriented x5, with no evidence or self-report of active SI/HI or A/V H.  Gina [name] reported compliance with medication and denied use of alcohol or illicit substances.  Gina [name] reported scores of 10/10 for depression, 5/10 for anxiety, and 10/10 for anger/irritability.  Gina [name] denied any recent outbursts or panic attacks.  Gina [name] reported that a success was attending an appointment with her individual therapist yesterday, which was helpful for processing recent marital issues.  Gina [name] reported that a struggle was having a difficult conversation with her husband about boundaries and not reaching an agreement.  Gina [name] reported that her goal today is to look into local agencies that offer women's resources since she  is concerned about a potential separation.     Second Therapeutic Activity: Counselor covered topic of conflict resolution today.  Counselor virtually shared a handout on subject with members which warned against the 'four horsemen' of communication traps that should be avoided due to tendency to escalate and damage a relationship.  These included criticism, defensiveness, contempt, and stonewalling.  'Antidotes' to these harmful behaviors were offered as healthy replacements to improve communication and understanding, including approaching problems with a gentle startup approach, taking responsibility for one's behavior, sharing fondness/admiration, and using self-soothing to calm down and focus on the problem at hand.  Counselor encouraged members to share recent experiences with conflict that they have faced, which approach they utilized, and any changes that they would plan to implement in order to improve overall conflict resolution skills.  Intervention was effective, as evidenced by Gina [name] actively participating in discussion on topic, reporting she has been struggling with communication within her marriage for a long time, and her husband tends to be the one that falls into 'traps' such as the one on this handout.  Gina [name] reported that he has been critical, accusing her of cheating, and when she tries to bring up issues such as an inappropriate relationship he has with a coworker, he will 'stonewall', shutting down and refusing to talk to her for days at a time.  Gina [name] reported that she has suggested marriage counseling to improve communication, but he does not seem interested at this time.    Assessment and Plan: Counselor recommends that Gina [name] remain in IOP treatment to better manage mental health symptoms, ensure stability and pursue completion of treatment plan goals. Counselor recommends adherence to crisis/safety plan, taking medications as prescribed, and following  up with medical professionals if  any issues arise.    Follow Up Instructions: Counselor will send [employer] Teams link for session tomorrow.  Gina [name] was advised to call back or seek an in-person evaluation if the symptoms worsen or if the condition fails to improve as anticipated.   Collaboration of Care:   Medication Management AEB Staci Kerns, NP                                           Case Manager AEB Ricka Gaskins, CNA    Patient/Guardian was advised Release of Information must be obtained prior to any record release in order to collaborate their care with an outside provider. Patient/Guardian was advised if they have not already done so to contact the registration department to sign all necessary forms in order for us  to release information regarding their care.    Consent: Patient/Guardian gives verbal consent for treatment and assignment of benefits for services provided during this visit. Patient/Guardian expressed understanding and agreed to proceed.   I provided 180 minutes of non-face-to-face time during this encounter.   Darleene Ricker, LCSW, LCAS 08/09/24

## 2024-08-10 ENCOUNTER — Ambulatory Visit (HOSPITAL_COMMUNITY)

## 2024-08-10 ENCOUNTER — Other Ambulatory Visit (HOSPITAL_COMMUNITY): Admitting: Licensed Clinical Social Worker

## 2024-08-10 DIAGNOSIS — F314 Bipolar disorder, current episode depressed, severe, without psychotic features: Secondary | ICD-10-CM | POA: Diagnosis not present

## 2024-08-10 NOTE — Progress Notes (Signed)
 Virtual Visit via Video Note   I connected with Bonnie Overdorf on 08/10/24 at  9:00 AM EDT by a video enabled telemedicine application and verified that I am speaking with the correct person using two identifiers.   At orientation to the IOP program, Case Manager discussed the limitations of evaluation and management by telemedicine and the availability of in person appointments. The patient expressed understanding and agreed to proceed with virtual visits throughout the duration of the program.   Location:  Patient: Patient Home Provider: Home Office   History of Present Illness: Bipolar I Disorder    Observations/Objective: Check In: Case Manager checked in with all participants to review discharge dates, insurance authorizations, work-related documents and needs from the treatment team regarding medications. Georgana stated needs and engaged in discussion.    Initial Therapeutic Activity: Counselor facilitated a check-in with Alliana to assess for safety, sobriety and medication compliance.  Counselor also inquired about Promyse's current emotional ratings, as well as any significant changes in thoughts, feelings or behavior since previous check in.  Jamilah presented for session on time and was alert, oriented x5, with no evidence or self-report of active SI/HI or A/V H.  Faron reported compliance with medication and denied use of alcohol or illicit substances.  Eilee reported scores of 5/10 for depression, 4/10 for anxiety, and 3/10 for anger/irritability.  Emmry denied any recent outbursts.  Ilze reported that a struggle was having a panic attack last night after making dinner and her husband continuing to ignore her, although a success was listening to music and taking time for herself afterward to calm down.  Marcelina reported that an additional success has been trying to be more active by dancing again.  Ghada reported that her goal today is to buy her daughter a gift and bake a cake in a last  attempt to strengthen their relationship.           Second Therapeutic Activity: Counselor discussed topic of sleep hygiene today with group.  Counselor defined this as the habits, behaviors and environmental factors that can be adjusted to improve overall sleep quality.  Counselor also discussed how lack of consistent sleep can negatively affect mood and overall mental health.  Counselor provided members with a screening to complete assessing typical barriers one might face in achieving quality sleep at night (i.e. struggling to get up in the morning, waking up throughout the night, tossing/turning, etc), and inquired about members' perception of sleep hygiene at present.  Counselor offered techniques to members via a virtual handout which could be implemented in order to improve sleep hygiene, such as sticking to a normal morning/night routine, avoiding using of electronics too close to bedtime, avoiding heavy meals before bed, using a sleep journal to track changes and address anxious thoughts, as well as avoiding naps during the daytime, ensuring proper use of medications if prescribed any by provider(s), and more.  Counselor inquired about changes members intend to make to sleep hygiene based upon information covered today.  Intervention effectiveness could not be measured, as Geraldean left early at 10:44am in order to attend a dentist appointment.   Third Therapeutic Activity: Psycho-educational portion of group was provided by Lorane Rochester, Interior and spatial designer of community education with [employer].  Alexandra provided information on history of her local agency, mission statement, and the variety of unique services offered which group members might find beneficial to engage in, including both virtual and in-person support groups, as well as peer support program for mentoring.  Alexandra offered time to answer member's questions regarding services and encouraged them to consider utilizing these services to  assist in working towards their individual wellness goals.  Intervention effectiveness could not be measured due to client leaving group early for conflicting appointment.    Assessment and Plan: Counselor recommends that Mariamawit remain in IOP treatment to better manage mental health symptoms, ensure stability and pursue completion of treatment plan goals. Counselor recommends adherence to crisis/safety plan, taking medications as prescribed, and following up with medical professionals if any issues arise.    Follow Up Instructions: Counselor will send [employer] Teams link for session tomorrow.  Nithila was advised to call back or seek an in-person evaluation if the symptoms worsen or if the condition fails to improve as anticipated.   Collaboration of Care:   Medication Management AEB Staci Kerns, NP                                           Case Manager AEB Ricka Gaskins, CNA    Patient/Guardian was advised Release of Information must be obtained prior to any record release in order to collaborate their care with an outside provider. Patient/Guardian was advised if they have not already done so to contact the registration department to sign all necessary forms in order for us  to release information regarding their care.    Consent: Patient/Guardian gives verbal consent for treatment and assignment of benefits for services provided during this visit. Patient/Guardian expressed understanding and agreed to proceed.   I provided 104 minutes of non-face-to-face time during this encounter.   Darleene Ricker, LCSW, LCAS 08/10/24

## 2024-08-13 ENCOUNTER — Ambulatory Visit: Admitting: Surgical

## 2024-08-13 ENCOUNTER — Other Ambulatory Visit (HOSPITAL_COMMUNITY): Admitting: Licensed Clinical Social Worker

## 2024-08-13 DIAGNOSIS — F314 Bipolar disorder, current episode depressed, severe, without psychotic features: Secondary | ICD-10-CM

## 2024-08-13 NOTE — Progress Notes (Signed)
 Virtual Visit via Video Note   I connected with Gina [name] on 08/13/24 at  9:00 AM EDT by a video enabled telemedicine application and verified that I am speaking with the correct person using two identifiers.   At orientation to the IOP program, Case Manager discussed the limitations of evaluation and management by telemedicine and the availability of in person appointments. The patient expressed understanding and agreed to proceed with virtual visits throughout the duration of the program.   Location:  Patient: [facility] Home Provider: [facility]   History of Present Illness: Bipolar I Disorder   Observations/Objective: Check In: Case Manager checked in with all participants to review discharge dates, insurance authorizations, work-related documents and needs from the treatment team regarding medications. Gina [name] stated needs and engaged in discussion.    Initial Therapeutic Activity: Counselor facilitated a check-in with Gina [name] to assess for safety, sobriety and medication compliance.  Counselor also inquired about Gina [name]'s current emotional ratings, as well as any significant changes in thoughts, feelings or behavior since previous check in.  Gina [name] presented for session on time and was alert, oriented x5, with no evidence or self-report of active SI/HI or A/V H.  Gina [name] reported compliance with medication and denied use of alcohol or illicit substances.  Gina [name] reported scores of 10/10 for depression, 10/10 for anxiety, and 10/10 for anger/irritability.  Gina [name] denied any recent outbursts or panic attacks.  Gina [name] reported that a struggle was talking with her husband today after they didn't speak for days, and having him inform her that he wants to end the marriage.  Gina [name] reported that it felt like the worst day of her life and she was struggling with feelings of guilt, as he put the blame on her for things falling apart.  Gina [name] reported that her goal today is to work with family to move past  this separation, as he texted her during group to inform that he was vacating the house.     Second Therapeutic Activity: Counselor introduced Gina [name], [facility] to provide psychoeducation on topic of Grief and Loss with members today.  Gina [name] began discussion by checking in with the group about their baseline mood today, general thoughts on what grief means to them and how it has affected them personally in the past.  Gina [name] provided information on how the process of grief/loss can differ depending upon one's unique culture, and categories of loss one could experience (i.e. loss of a person, animal, relationship, job, identity, etc).  Gina [name] encouraged members to be mindful of how pervasive loss can be, and how to recognize signs which could indicate that this is having an impact on one's overall mental health and wellbeing.  Intervention was effective, as evidenced by Gina [name] participating in discussion with speaker on the subject, reporting that she felt like a bad person for everything that is transpiring with the separation from her husband.  Gina [name] reported that she is worried about being alone, and cannot reconcile being single after so many years in a marriage.  Gina [name] was receptive to supportive feedback from the chaplain and peers.  Gina [name] asked to be excused from group at 10:40am.    Assessment and Plan: Counselor recommends that Gina [name] remain in IOP treatment to better manage mental health symptoms, ensure stability and pursue completion of treatment plan goals. Counselor recommends adherence to crisis/safety plan, taking medications as prescribed, and following up with medical professionals if any issues arise.    Follow Up Instructions: Counselor will send  [employer] Teams link for session tomorrow.  Gina [name] was advised to call back or seek an in-person evaluation if the symptoms worsen or if the condition fails to improve as anticipated.   Collaboration of Care:   Medication Management  AEB Staci Kerns, NP                                           Case Manager AEB Ricka Gaskins, CNA    Patient/Guardian was advised Release of Information must be obtained prior to any record release in order to collaborate their care with an outside provider. Patient/Guardian was advised if they have not already done so to contact the registration department to sign all necessary forms in order for us  to release information regarding their care.    Consent: Patient/Guardian gives verbal consent for treatment and assignment of benefits for services provided during this visit. Patient/Guardian expressed understanding and agreed to proceed.   I provided 110 minutes of non-face-to-face time during this encounter.   Darleene Ricker, LCSW, LCAS 08/13/24

## 2024-08-14 ENCOUNTER — Other Ambulatory Visit (HOSPITAL_COMMUNITY): Admitting: Psychiatry

## 2024-08-14 DIAGNOSIS — F314 Bipolar disorder, current episode depressed, severe, without psychotic features: Secondary | ICD-10-CM | POA: Diagnosis not present

## 2024-08-14 NOTE — Progress Notes (Signed)
 Virtual Visit via Video Note   I connected with Shacara Cozine on 08/14/24 at  9:00 AM EDT by a video enabled telemedicine application and verified that I am speaking with the correct person using two identifiers.   At orientation to the IOP program, Case Manager discussed the limitations of evaluation and management by telemedicine and the availability of in person appointments. The patient expressed understanding and agreed to proceed with virtual visits throughout the duration of the program.   Location:  Patient: Patient Home Provider: Home Office   History of Present Illness: Bipolar I Disorder    Observations/Objective: Check In: Case Manager checked in with all participants to review discharge dates, insurance authorizations, work-related documents and needs from the treatment team regarding medications. Latish stated needs and engaged in discussion.    Initial Therapeutic Activity: Counselor facilitated a check-in with Lulu to assess for safety, sobriety and medication compliance.  Counselor also inquired about Thaily's current emotional ratings, as well as any significant changes in thoughts, feelings or behavior since previous check in.  Zuri presented for session on time and was alert, oriented x5, with no evidence or self-report of active SI/HI or A/V H.  Genora reported compliance with medication and denied use of alcohol or illicit substances.  Saara reported scores of 5/10 for depression, 0/10 for anxiety, and 4/10 for anger/irritability.  Novalee denied any recent outbursts or panic attacks.  Mariella reported that a struggle was coping with the news of her husband's separation yesterday.  She reported that she spent some time with her parents for support.  Keonna reported that a success was getting up early today to work out and dance for self-care.  Beyonka reported that her goal today is to focus more on self-care activities to distract from the breakup.      Second Therapeutic  Activity: Counselor introduced topic of assertive communication today.  Counselor shared various handouts with members virtually in group to read along with on the subject.  These handouts defined assertive communication as a communication style in which a person stands up for their own needs and wants, while also taking into consideration the needs and wants of others, without behaving in a passive or aggressive way.  Traits of assertive communicators were highlighted such as using appropriate speaking volume, maintaining eye contact, using confident language, and avoiding interruption.  Members were also provided with tips on how to improve communication, including respecting oneself, expressing thoughts and feelings calmly, and saying "No" when necessary.  Members were given a variety of scenarios where they could practice using these tips to respond in an assertive manner.  Intervention effectiveness could not be measured, as Sadiyah requested to leave group after break in order to spend time with her daughter offering support following recent news of divorce.   Assessment and Plan: Counselor recommends that Jaonna remain in IOP treatment to better manage mental health symptoms, ensure stability and pursue completion of treatment plan goals. Counselor recommends adherence to crisis/safety plan, taking medications as prescribed, and following up with medical professionals if any issues arise.    Follow Up Instructions: Counselor will send [employer] Teams link for session tomorrow.  Niralya was advised to call back or seek an in-person evaluation if the symptoms worsen or if the condition fails to improve as anticipated.   Collaboration of Care:   Medication Management AEB Staci Kerns, NP  Case Manager AEB Ricka Gaskins, CNA    Patient/Guardian was advised Release of Information must be obtained prior to any record release in order to collaborate their care with an  outside provider. Patient/Guardian was advised if they have not already done so to contact the registration department to sign all necessary forms in order for us  to release information regarding their care.    Consent: Patient/Guardian gives verbal consent for treatment and assignment of benefits for services provided during this visit. Patient/Guardian expressed understanding and agreed to proceed.   I provided 90 minutes of non-face-to-face time during this encounter.   Darleene Ricker, LCSW, LCAS 08/14/24

## 2024-08-15 ENCOUNTER — Other Ambulatory Visit (HOSPITAL_COMMUNITY)

## 2024-08-16 ENCOUNTER — Encounter (HOSPITAL_COMMUNITY): Payer: Self-pay | Admitting: Psychiatry

## 2024-08-16 ENCOUNTER — Other Ambulatory Visit (HOSPITAL_COMMUNITY): Admitting: Psychiatry

## 2024-08-16 ENCOUNTER — Telehealth (HOSPITAL_COMMUNITY): Payer: Self-pay | Admitting: Psychiatry

## 2024-08-16 NOTE — Telephone Encounter (Signed)
 D:  Pt is requesting to be discharged today due to a 10 a.m appt with her attorney.  Pt was scheduled for discharge tomorrow, per pt's previous request.  A:  Will go ahead and discharge pt as per 08-14-24; since that was her last day attending MH-IOP.  Inform the treatment team.  R:  Pt receptive.

## 2024-08-16 NOTE — Progress Notes (Signed)
 Virtual Visit via Video Note  I connected with Gina [name] on 08/16/24 at  9:00 AM EDT by a video enabled telemedicine application and verified that I am speaking with the correct person using two identifiers.  Location: Patient: Home Provider: Office   I discussed the limitations of evaluation and management by telemedicine and the availability of in person appointments. The patient expressed understanding and agreed to proceed.  I discussed the assessment and treatment plan with the patient. The patient was provided an opportunity to ask questions and all were answered. The patient agreed with the plan and demonstrated an understanding of the instructions.   The patient was advised to call back or seek an in-person evaluation if the symptoms worsen or if the condition fails to improve as anticipated.  I provided 15 minutes of non-face-to-face time during this encounter.   Staci LOISE Kerns, NP    Health Intensive Outpatient Program Discharge Summary  Gina [name] [id number]  Admission date: 07/26/2023 Discharge date: 08/16/2024  Reason for admission: Gina [name] is a 48 year old female who presents after recent inpatient admission.  She reports a history of major depressive disorder, generalized anxiety disorder and bipolar disorder.  States she is currently followed by psychiatrist Jess. ([facility]) states she is prescribed Lexapro , Latuda  and Ativan .  States she has been taking and tolerating medications well.  States she recently had her medications taper with Center in a tailspin.  States she was experiencing suicidal ideation.  Denying plan or intent.  Reports she had been off of her Wellbutrin  for the past 5 days.  States her mood is stabilized since her discharge.   Progress in Program Toward Treatment Goals: Progressing patient attended and participated within the group session with active and engaged participation.  Denying any suicidal  or homicidal ideations at discharge.  Reports overall group setting has been helpful.  States learning multiple coping skills and setting boundaries.  Progress (rationale): Patient to follow-up with psychiatrist Jess and therapist Venytta  denied medication refills.   Collaboration of Care: Medication Management AEB continue medications as directed  Take all of you medications as prescribed by your mental healthcare provider.  Report any adverse effects and reactions from your medications to your outpatient provider promptly.  Do not engage in alcohol and or illegal drug use while on prescription medicines. Keep all scheduled appointments. This is to ensure that you are getting refills on time and to avoid any interruption in your medication.  If you are unable to keep an appointment call to reschedule.  Be sure to follow up with resources and follow ups given. In the event of worsening symptoms call the crisis hotline, 911, and or go to the nearest emergency department for appropriate evaluation and treatment of symptoms. Follow-up with your primary care provider for your medical issues, concerns and or health care needs.    Patient/Guardian was advised Release of Information must be obtained prior to any record release in order to collaborate their care with an outside provider. Patient/Guardian was advised if they have not already done so to contact the registration department to sign all necessary forms in order for us  to release information regarding their care.   Consent: Patient/Guardian gives verbal consent for treatment and assignment of benefits for services provided during this visit. Patient/Guardian expressed understanding and agreed to proceed.   Staci LOISE Kerns, NP 08/16/2024

## 2024-08-16 NOTE — Patient Instructions (Signed)
 D:  Pt completed MH-IOP today (08-16-24).  A:  Discharge today.  Follow up with Dr. Jess and therapist ([facility]?).  Strongly encouraged support groups (separated/divorce group); groups through [facility] or [facility].  R:  Patient receptive.

## 2024-08-16 NOTE — Progress Notes (Signed)
 Virtual Visit via Video Note  I connected with Gina [name] on @TODAY @ at  9:00 AM EDT by a video enabled telemedicine application and verified that I am speaking with the correct person using two identifiers.  Location: Patient: at home Provider: at office   I discussed the limitations of evaluation and management by telemedicine and the availability of in person appointments. The patient expressed understanding and agreed to proceed.  I discussed the assessment and treatment plan with the patient. The patient was provided an opportunity to ask questions and all were answered. The patient agreed with the plan and demonstrated an understanding of the instructions.   The patient was advised to call back or seek an in-person evaluation if the symptoms worsen or if the condition fails to improve as anticipated.  I provided 15 minutes of non-face-to-face time during this encounter.   Gina [name], M.Ed,CNA   Patient ID: Gina [name], female   DOB: June 14, 1976, 48 y.o.   MRN: [id number] D:  This is from pt's most recent CCA:   Pt presents as referral for PHP from [facility]. Pt states struggling with mental and physical health and a few days without medication last week declined her further. Pt reports she feels like my brain is on a rollercoaster. it swoops and crashes. I feel physically rattled. Pt reports historical diagnoses of bipolar 1, GAD, and PTSD. Pt reports 1 manic episode 9 years ago which precipitated hospitalization, and no manic sx since. Pt reports physical issues of fibromyalgia, 2 shoulder surgeries, a broken knee cap, issues with her foot, and perimenopause. Pt reports 2 years ago things began a strong decline and is unable to say why. Pt states  i don't know my body anymore. I don't trust my body anymore. Pt states sleep is variable, appetitie is low and she is gaining weight despite not eating, ADL's are low, and energy is horrible. Pt has regular MH providers and no  PCP. Pt reports some support from husband, younger daughter, and two friends. Pt states older daighter is no contact with pt, which is a stressor. Pt denies SI/HI.   Pt requested  to transfer to virtual MH-IOP from PHP d/t worsening back pain from sitting in the groups. Pt started virtual MH-IOP today.  Couldn't get her video to work on her computer at home.  Pt reports a hx of MDD, GAD, and Bipolar D/O. Denies SI/HI or A/V hallucinations.  Pt rates her depression at a 5 and her anxiety at a 4 on a scale of 1-10 (10 being the worst).  PHQ-9= 5.  Hx of being in MH-IOP yrs ago. Stressors according to pt:  1) No contact with daughter  2) Health Issues. Pt has attended all scheduled days in group.  Pt is requesting to go ahead and discharge today (08-16-24) d/t an attorney appointment at 10 a.m.Gina [name]  Reports overall mood is improving.  My life has changed whenever my husband of 23 yrs left me/marriage on Monday 08-13-24.  Lastnight was my first night home alone b/c our daughter went and spent the night with him lastnight.  Pt denies SI/HI or A/V hallucinations.  Reports her stress level has decreased.  On a scale of 1-10 (10 being the worst); pt rates her depression and anxiety at a 3 or 4.  Reports that the MH-IOP is excellent where she has learned boundaries and acceptance. A:  D/C today.  F/u with Dr. Jess and therapist ([facility]?).  Strongly encouraged support group for  recently separated/divorce individuals.  Also encouraged to f/u with [facility] and [facility]. Pt was advised of ROI must be obtained prior to any records release in order to collaborate her care with an outside provider.  Pt was advised if she has not already done so to contact the front desk to sign all necessary forms in order for MH-IOP to release info re: her care.  Consent:  Pt gives verbal consent for tx and assignment of benefits for services provided during this telehealth group process.  Pt expressed  understanding and agreed to proceed. Collaboration of care:  Collaborate with Staci Kerns, NP AEB, Dr. Jess HAIR; Darleene Ricker, LCSW AEB and therapist ([facility]). R:  Pt receptive.  Ricka Gaskins, M.Ed,CNA

## 2024-08-17 ENCOUNTER — Other Ambulatory Visit (HOSPITAL_COMMUNITY)

## 2024-08-20 ENCOUNTER — Ambulatory Visit (HOSPITAL_COMMUNITY)

## 2024-08-21 ENCOUNTER — Ambulatory Visit (HOSPITAL_COMMUNITY)

## 2024-08-22 ENCOUNTER — Encounter: Admitting: Internal Medicine

## 2024-08-22 ENCOUNTER — Encounter: Admitting: Family Medicine

## 2024-08-22 ENCOUNTER — Ambulatory Visit (HOSPITAL_COMMUNITY)

## 2024-08-23 ENCOUNTER — Ambulatory Visit (HOSPITAL_COMMUNITY)

## 2024-08-24 ENCOUNTER — Ambulatory Visit (HOSPITAL_COMMUNITY)

## 2024-08-29 ENCOUNTER — Telehealth: Payer: Self-pay | Admitting: Internal Medicine

## 2024-08-29 NOTE — Telephone Encounter (Signed)
 Patient is requesting a call from someone to schedule her and the patients daughter (I do not have her information) an appt w/ Dr Jesus.  Thank you  Darice FORBES [facility] AW[facility] TEAM Direct Dial [phone]

## 2024-09-03 ENCOUNTER — Encounter: Payer: Self-pay | Admitting: Radiology

## 2024-09-03 ENCOUNTER — Telehealth: Payer: Self-pay | Admitting: Surgical

## 2024-09-03 NOTE — Telephone Encounter (Signed)
 Patient called. Says she wants to be worked in this week.

## 2024-09-05 ENCOUNTER — Ambulatory Visit

## 2024-09-05 VITALS — Ht 67.0 in | Wt 219.0 lb

## 2024-09-05 DIAGNOSIS — Z0000 Encounter for general adult medical examination without abnormal findings: Secondary | ICD-10-CM | POA: Diagnosis not present

## 2024-09-05 NOTE — Progress Notes (Cosign Needed Addendum)
 Subjective:   Gina [name] is a 48 y.o. female who presents for a Medicare Annual Wellness Visit.  I connected with  Gina [name] on 09/05/24 by a audio enabled telemedicine application and verified that I am speaking with the correct person using two identifiers.  Patient Location: Home  Provider Location: Home Office  I discussed the limitations of evaluation and management by telemedicine. The patient expressed understanding and agreed to proceed.   Allergies (verified) Lamictal [lamotrigine], Maxalt  [rizatriptan ], Morphine  and codeine , Nicotine , Wellbutrin  [bupropion ], Zofran , Chantix  [varenicline  tartrate], Ciprofloxacin , Codeine , Haldol  [haloperidol ], Ibuprofen, Lidoderm  [lidocaine ], Skelaxin [metaxalone], Sulfa antibiotics, Sulfasalazine, Toradol  [ketorolac  tromethamine ], Ambien [zolpidem], Doxycycline , Duloxetine, Ingrezza [valbenazine tosylate], Lyrica [pregabalin], Methadone, Neurontin  [gabapentin ], Other, Prednisone , Amoxicillin, Aripiprazole, Chlorhexidine , Penicillins, and Tramadol    History: Past Medical History:  Diagnosis Date   ABDOMINAL WALL HERNIA 02/27/2010   Abnormal Pap smear    ALLERGIC RHINITIS 06/02/2007   ANOREXIA, CHRONIC 08/07/2008   ANXIETY 09/15/2010   Arthritis    BACK PAIN, THORACIC REGION 07/07/2007   Bipolar disorder (HCC)    CERVICAL RADICULOPATHY 12/11/2008   Complication of anesthesia    Pateint reprots a high tolerance   Complication of anesthesia    pt states she has not had her voice completely back since surgery in Nov. 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   Eating disorder    ENDOMETRIOSIS 12/16/2009   FATIGUE 06/11/2010   Fibromyalgia    GERD 10/23/2009   Heart palpitations    HERPES SIMPLEX INFECTION 06/11/2010   LENTIGO 04/23/2009   LUNG NODULE 06/17/2010   Narcotic abuse, continuous (HCC)    pt denies   Ovarian  cyst    Palpitations 10/23/2009   Panic disorder    Rheumatoid arthritis(714.0)    Stricture and stenosis of esophagus 05/13/2010   TOBACCO ABUSE 08/07/2008   Urinary tract infection    UTI (urinary tract infection)    Past Surgical History:  Procedure Laterality Date   ABDOMINAL SURGERY     ABDOMINAL WALL MESH  REMOVAL     ABLATION ON ENDOMETRIOSIS     CESAREAN SECTION     x2    CYSTOSCOPY N/A 11/22/2023   Procedure: CYSTOSCOPY;  Surgeon: Elisabeth Valli BIRCH, MD;  Location: [facility];  Service: Urology;  Laterality: N/A;   DILATION AND CURETTAGE OF UTERUS     x1    ENDOMETRIAL ABLATION     esophageal dilatation x 4     GUM SURGERY     HERNIA REPAIR     X3   PARTIAL HYSTERECTOMY     POSTERIOR LUMBAR FUSION 2 WITH HARDWARE REMOVAL Left 03/29/2024   Procedure: ARTHROSCOPY, SHOULDER WITH DEBRIDEMENT;  Surgeon: Addie Cordella Hamilton, MD;  Location: [facility] OR;  Service: Orthopedics;  Laterality: Left;  LEFT SHOULDER ARTHROSCOPY, DEBRIDEMENT, DISTAL CLAVICLE EXCISION   RESECTION DISTAL CLAVICAL Left 03/29/2024   Procedure: EXCISION, CLAVICLE, DISTAL, OPEN;  Surgeon: Addie Cordella Hamilton, MD;  Location: [facility] OR;  Service: Orthopedics;  Laterality: Left;   SHOULDER SURGERY Right 09/2023   Family History  Problem Relation Age of Onset   Depression Mother    Arthritis Mother    Hyperlipidemia Father    Hypertension Father    Heart attack Father    Heart disease Father    Cancer Father    Diabetes Father    Stroke Father    Social History   Occupational History   Not  on file  Tobacco Use   Smoking status: Former    Current packs/day: 0.20    Types: Cigarettes   Smokeless tobacco: Never   Tobacco comments:    2-3 cigarettes per day as of May 2025  Vaping Use   Vaping status: Some Days  Substance and Sexual Activity   Alcohol use: No   Drug use: No   Sexual activity: Yes    Birth control/protection: Surgical   Tobacco Counseling Counseling given: Not Answered Tobacco comments: 2-3  cigarettes per day as of May 2025  SDOH Screenings   Food Insecurity: No Food Insecurity (09/05/2024)  Housing: Unknown (09/05/2024)  Transportation Needs: No Transportation Needs (09/05/2024)  Utilities: Not At Risk (09/05/2024)  Alcohol Screen: Low Risk  (07/04/2024)  Depression (PHQ2-9): Low Risk  (09/05/2024)  Recent Concern: Depression (PHQ2-9) - Medium Risk (07/25/2024)  Financial Resource Strain: Low Risk  (04/30/2024)  Physical Activity: Sufficiently Active (09/05/2024)  Social Connections: Moderately Isolated (09/05/2024)  Stress: No Stress Concern Present (09/05/2024)  Tobacco Use: Medium Risk (09/05/2024)  Health Literacy: Adequate Health Literacy (09/05/2024)   Depression Screen    09/05/2024    8:22 AM 07/30/2024    3:15 PM 07/25/2024    4:08 PM 07/23/2024    2:05 PM 07/16/2024    1:08 PM 04/30/2024   11:22 AM 12/06/2019    1:07 PM  PHQ 2/9 Scores  PHQ - 2 Score 0 0    0 0  PHQ- 9 Score      9      Information is confidential and restricted. Go to Review Flowsheets to unlock data.     Goals Addressed             This Visit's Progress    Patient Stated       Joined a gym for weight lose       Visit info / Clinical Intake: Medicare Wellness Visit Type:: Subsequent Annual Wellness Visit Medicare Wellness Visit Mode:: Telephone If telephone:: video error If telephone or video:: unable to obtan vitals due to lack of equipment Interpreter Needed?: No Pre-visit prep was completed: yes AWV questionnaire completed by patient prior to visit?: no Living arrangements:: with family/others Patient's Overall Health Status Rating: good Typical amount of pain: none Does pain affect daily life?: no  Dietary Habits and Nutritional Risks How many meals a day?: 2 Eats fruit and vegetables daily?: (!) no Most meals are obtained by: preparing own meals; eating out Diabetic:: no  Functional Status Activities of Daily Living (to include ambulation/medication):  Independent Ambulation: Independent with device- listed below Home Assistive Devices/Equipment: Eyeglasses Medication Administration: Independent Home Management: Independent Manage your own finances?: yes Primary transportation is: driving Concerns about vision?: no *vision screening is required for [facility]* (wears glasses) Concerns about hearing?: no  Fall Screening Falls in the past year?: 1 Number of falls in past year: 1 Was there an injury with Fall?: 1 Fall Risk Category Calculator: 3 Patient Fall Risk Level: High Fall Risk  Fall Risk Patient at Risk for Falls Due to: History of fall(s); Impaired mobility; Impaired balance/gait (left knee concerns) Fall risk Follow up: Falls prevention discussed  Home and Transportation Safety: All rugs have non-skid backing?: N/A, no rugs All stairs or steps have railings?: yes Grab bars in the bathtub or shower?: yes Have non-skid surface in bathtub or shower?: (!) no Good home lighting?: yes Regular seat belt use?: yes Hospital stays in the last year:: (!) yes How many hospital stays:: 2 Reason:  operations  Cognitive Assessment Difficulty concentrating, remembering, or making decisions? : no Will 6CIT or Mini Cog be Completed: no 6CIT or Mini Cog Declined: patient alert, oriented, able to answer questions appropriately and recall recent events  Advance Directives (For Healthcare) Does Patient Have a Medical Advance Directive?: No Would patient like information on creating a medical advance directive?: No - Patient declined  Reviewed/Updated  Reviewed/Updated: All        Objective:    Today's Vitals   09/05/24 0810  Weight: 219 lb (99.3 kg)  Height: 5' 7 (1.702 m)   Body mass index is 34.3 kg/m.  Current Medications (verified) Outpatient Encounter Medications as of 09/05/2024  Medication Sig   calcium carbonate (TUMS - DOSED IN MG ELEMENTAL CALCIUM) 500 MG chewable tablet Chew 1 tablet by mouth 4 (four) times daily as  needed for indigestion or heartburn.   Cholecalciferol (VITAMIN D3 PO) Take 2 each by mouth daily. Gummy   cholestyramine  (QUESTRAN ) 4 g packet Take 1 packet (4 g total) by mouth 3 (three) times daily with meals.   diazepam  (VALIUM ) 5 MG tablet Take 1 tablet (5 mg total) by mouth 2 (two) times daily as needed for muscle spasms.   EC-RX Testosterone 0.2 % CREA Place onto the skin.   escitalopram  (LEXAPRO ) 10 MG tablet Take 10 mg by mouth daily.   hyoscyamine  (LEVSIN  SL) 0.125 MG SL tablet Place 0.125 mg under the tongue every 4 (four) hours as needed for cramping.   LORazepam  (ATIVAN ) 1 MG tablet Take 1 mg by mouth every 8 (eight) hours as needed for anxiety.   lurasidone  (LATUDA ) 40 MG TABS tablet Take 40 mg by mouth every evening.   methocarbamol  (ROBAXIN ) 500 MG tablet TAKE 1 TABLET BY MOUTH EVERY 8 HOURS AS NEEDED FOR UP TO 7 DAYS   promethazine  (PHENERGAN ) 25 MG tablet Take 25 mg by mouth every 6 (six) hours as needed for nausea or vomiting.   [DISCONTINUED] oxyCODONE -acetaminophen  (PERCOCET/ROXICET) 5-325 MG tablet Take 1 tablet by mouth every 4 (four) hours as needed for severe pain (pain score 7-10).   [DISCONTINUED] Oyster Shell (OYSTER CALCIUM) 500 MG TABS tablet Take 500 mg of elemental calcium by mouth daily.   [DISCONTINUED] PRESCRIPTION MEDICATION Apply 1-2 Applications topically at bedtime. Testosterone 4% cream   [DISCONTINUED] semaglutide-weight management (WEGOVY) 0.25 MG/0.5ML SOAJ SQ injection Inject 0.25 mg into the skin once a week.   [DISCONTINUED] Suzetrigine  (JOURNAVX ) 50 MG TABS Take 1 tablet by mouth 2 (two) times daily as needed.   [DISCONTINUED] tirzepatide  (ZEPBOUND ) 2.5 MG/0.5ML Pen Inject 2.5 mg into the skin once a week.   No facility-administered encounter medications on file as of 09/05/2024.   Hearing/Vision screen Hearing Screening - Comments:: Pt denies any hearing issues  Vision Screening - Comments:: Wears rx glasses - up to date with routine eye exams with  [facility]  Immunizations and Health Maintenance Health Maintenance  Topic Date Due   Hepatitis C Screening  Never done   Hepatitis B Vaccines 19-59 Average Risk (2 of 3 - 19+ 3-dose series) 06/27/2015   DTaP/Tdap/Td (9 - Td or Tdap) 11/02/2015   Mammogram  Never done   COVID-19 Vaccine (3 - 2025-26 season) 07/02/2024   Influenza Vaccine  01/29/2025 (Originally 06/01/2024)   Cervical Cancer Screening (HPV/Pap Cotest)  03/02/2025   Medicare Annual Wellness (AWV)  09/05/2025   Colonoscopy  06/04/2032   HIV Screening  Completed   Pneumococcal Vaccine  Aged Out   HPV VACCINES  Aged Out   Meningococcal B Vaccine  Aged Out        Assessment/Plan:  This is a routine wellness examination for Gina [name].  Patient Care Team: [name], MD as PCP - General (Internal Medicine) Jess Roby Masker, MD (Psychiatry) Magnant, Carlin CROME, PA-C as Physician Assistant (Orthopedic Surgery) Dale Cough, MD as Referring Physician (Endocrinology) Mat Browning, MD as Consulting Physician (Obstetrics and Gynecology)  I have personally reviewed and noted the following in the patient's chart:   Medical and social history Use of alcohol, tobacco or illicit drugs  Current medications and supplements including opioid prescriptions. Functional ability and status Nutritional status Physical activity Advanced directives List of other physicians Hospitalizations, surgeries, and ER visits in previous 12 months Vitals Screenings to include cognitive, depression, and falls Referrals and appointments  No orders of the defined types were placed in this encounter.  In addition, I have reviewed and discussed with patient certain preventive protocols, quality metrics, and best practice recommendations. A written personalized care plan for preventive services as well as general preventive health recommendations were provided to patient.   Gina VEAR Haws, LPN   [id number]   Return in 1 year (on  09/05/2025).  After Visit Summary: (MyChart) Due to this being a telephonic visit, the after visit summary with patients personalized plan was offered to patient via MyChart   Nurse Notes: pt is requesting EC-RX Testosterone 0.2 % CREAM be refilled by pcp due to no longer getting it from other provider

## 2024-09-05 NOTE — Patient Instructions (Signed)
 Gina [name],  Thank you for taking the time for your Medicare Wellness Visit. I appreciate your continued commitment to your health goals. Please review the care plan we discussed, and feel free to reach out if I can assist you further.  Please note that Annual Wellness Visits do not include a physical exam. Some assessments may be limited, especially if the visit was conducted virtually. If needed, we may recommend an in-person follow-up with your provider.  Ongoing Care Seeing your primary care provider every 3 to 6 months helps us  monitor your health and provide consistent, personalized care.   Referrals If a referral was made during today's visit and you haven't received any updates within two weeks, please contact the referred provider directly to check on the status.  Recommended Screenings:  Health Maintenance  Topic Date Due   Hepatitis C Screening  Never done   Hepatitis B Vaccine (2 of 3 - 19+ 3-dose series) 06/27/2015   DTaP/Tdap/Td vaccine (9 - Td or Tdap) 11/02/2015   Breast Cancer Screening  Never done   Medicare Annual Wellness Visit  03/03/2023   Flu Shot  06/01/2024   COVID-19 Vaccine (3 - 2025-26 season) 07/02/2024   Pap with HPV screening  03/02/2025   Colon Cancer Screening  06/04/2032   HIV Screening  Completed   Pneumococcal Vaccine  Aged Out   HPV Vaccine  Aged Out   Meningitis B Vaccine  Aged Out       07/09/2024    3:13 PM  Advanced Directives  Does Patient Have a Medical Advance Directive? No    Vision: Annual vision screenings are recommended for early detection of glaucoma, cataracts, and diabetic retinopathy. These exams can also reveal signs of chronic conditions such as diabetes and high blood pressure.  Dental: Annual dental screenings help detect early signs of oral cancer, gum disease, and other conditions linked to overall health, including heart disease and diabetes.  Please see the attached documents for additional preventive care  recommendations.

## 2024-09-06 ENCOUNTER — Encounter: Payer: Self-pay | Admitting: Surgical

## 2024-09-06 ENCOUNTER — Other Ambulatory Visit

## 2024-09-06 ENCOUNTER — Ambulatory Visit (INDEPENDENT_AMBULATORY_CARE_PROVIDER_SITE_OTHER): Admitting: Surgical

## 2024-09-06 ENCOUNTER — Telehealth: Payer: Self-pay

## 2024-09-06 DIAGNOSIS — M25562 Pain in left knee: Secondary | ICD-10-CM

## 2024-09-06 NOTE — Telephone Encounter (Signed)
 Gina [name] is wanting patient to get in with Dr Burnetta for shockwave Left achilles tendinitis and PF

## 2024-09-06 NOTE — Progress Notes (Signed)
 Office Visit Note   Patient: Gina [name]           Date of Birth: November 16, 1975           MRN: [id number] Visit Date: 09/06/2024 Requested by: [name], MD [address] [place],  [place] [address] PCP: [name], MD  Subjective: Chief Complaint  Patient presents with   Left Knee - Pain   Left Foot - Pain    HPI: Gina [name] is a 48 y.o. female who presents to the office reporting left knee pain and left foot pain.  Patient has had some stressors in her personal life in the last 4 to 6 weeks.  Since then she has become more active and lost 15 pounds.  She is doing a lot of stationary bike exercises as well as some physical therapy exercises and weight lifting.  She describes medial left knee pain with some occasional clicking/popping.  Does have history of prior patellar fracture.  No actual injury event that she can recall.  Knee has been bothering her for about a week.  Regarding the left foot, she describes pain around the heel both on the plantar aspect and the posterior aspect of the heel.  Denies any mechanical symptoms.  Having difficulty putting weight on the leg fully, she is able to walk around but it causes her significant pain.  Has not tried any orthotics.  Has occasional numbness and tingling..                ROS: All systems reviewed are negative as they relate to the chief complaint within the history of present illness.  Patient denies fevers or chills.  Assessment & Plan: Visit Diagnoses:  1. Left knee pain, unspecified chronicity     Plan: Impression is left knee pain has been ongoing for about a week with pain at the medial joint line.  Radiographs are negative.  The previous patellar fracture is completely healed at this point with no evidence of any fracture line on today's radiographs.  We discussed options available for the knee such as continuing with working with her physical therapist versus activity modification versus  observation versus injection.  Too soon to do any sort of advanced imaging such as MRI.  She would like to forego injection for now and continue with observation.  If she continues to notice medial sided pain and clicking/popping the next step would be injection versus MRI scan if this is ongoing for the next 4 to 6 weeks.  Regarding the left foot, she has fairly severe tenderness over the insertion of the Achilles tendon as well as the plantar fascia.  We discussed heel cord stretching and I helped her construct a text message to her physical therapist explaining what is going on.  Discussed orthotics as an option and shockwave therapy.  We will try this and I will set her up to see Dr. Burnetta.  She understands that this will have some discomfort but the short-term discomfort in my experience is worth the relief from this modality.  Plan to follow-up in 4 weeks.  Follow-Up Instructions: Return in about 4 weeks (around 10/04/2024).   Orders:  Orders Placed This Encounter  Procedures   XR KNEE 3 VIEW LEFT   No orders of the defined types were placed in this encounter.     Procedures: No procedures performed   Clinical Data: No additional findings.  Objective: Vital Signs: LMP 01/01/2004   Physical Exam:  Constitutional: Patient appears well-developed HEENT:  Head: Normocephalic Eyes:EOM are normal Neck: Normal range of motion Cardiovascular: Normal rate Pulmonary/chest: Effort normal Neurologic: Patient is alert Skin: Skin is warm Psychiatric: Patient has normal mood and affect  Ortho Exam: Ortho exam demonstrates left knee without effusion.  Tenderness over the medial joint line.  No tenderness over the lateral joint line.  Minimal tenderness over the inferior pole of the patella at the prior patellar fracture site.  Able to perform straight leg raise without extensor lag.  Excellent quad and hamstring strength.  Stable to varus and valgus stress at 0 and 30 degrees.  Stable to  anterior posterior drawer sign.  No calf tenderness.  Achilles tendon is palpable and intact.  Does have tenderness over the insertion of the Achilles on the calcaneus.  Also has tenderness over the plantar aspect of the calcaneus at the plantar fascia.  Palpable DP pulse of the left lower extremity.  Intact EHL, dorsiflexion, plantarflexion, inversion, eversion without weakness.  She has a little bit of tenderness over the navicular at the attachment of the posterior tib tendon though the posterior tib tendon is palpable and intact and firing when she inverts her foot.  No tenderness elsewhere in the foot aside from some mild to moderate tenderness at the first MTP joint.  Specialty Comments:  Narrative & Impression CLINICAL DATA:  Low back pain, prior surgery, new symptoms. Ongoing low back pain for 2 months. No known injury. No prior surgery to area.   EXAM: MRI LUMBAR SPINE WITHOUT AND WITH CONTRAST   TECHNIQUE: Multiplanar and multiecho pulse sequences of the lumbar spine were obtained without and with intravenous contrast.   CONTRAST:  10mL GADAVIST  GADOBUTROL  1 MMOL/ML IV SOLN   COMPARISON:  Lumbar MRI 04/02/2018. CT of the abdomen and pelvis 06/15/2023. Lumbar spine radiographs 09/18/2018.   FINDINGS: Segmentation: Conventional anatomy assumed, with the last open disc space designated L5-S1.Concordant with prior imaging.   Alignment: Mild convex right scoliosis. No focal angulation or listhesis.   Vertebrae: No worrisome osseous lesion, acute fracture or pars defect. Chronic Schmorl's node in the inferior endplate of L1. No evidence of discitis or osteomyelitis. No obvious postsurgical changes in the lumbar spine.   Conus medullaris: Extends to the L1-2 level and appears normal. No abnormal intradural enhancement.   Paraspinal and other soft tissues: No significant paraspinal findings.   Disc levels:   Sagittal images demonstrate no significant disc space  findings within the visualized lower thoracic spine.   L1-2: Stable loss of disc height with disc bulging and endplate osteophytes. No spinal stenosis or nerve root encroachment.   L2-3: Normal interspace.   L3-4: Normal interspace.   L4-5: Normal interspace.   L5-S1: Normal interspace.   IMPRESSION: 1. No acute findings or explanation for the patient's symptoms. 2. Stable spondylosis at L1-2 without resulting spinal stenosis or nerve root encroachment.     Electronically Signed   By: Elsie Perone M.D.   On: 07/29/2023 18:36  Imaging: XR KNEE 3 VIEW LEFT Result Date: 09/06/2024 AP, lateral, sunrise views of left knee reviewed.  No fracture or dislocation.  No significant degenerative changes noted of the left knee.  Prior inferior pole patellar fracture appears fully healed with no residual fracture line.    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   Status post cholecystectomy 07/20/2024   Hepatic steatosis 07/20/2024   Flushing 07/20/2024   Allodynia 07/20/2024  Low testosterone 07/20/2024   Fibromyalgia 07/20/2024   Intolerance, drug 07/20/2024   Cushing's syndrome 07/18/2024   Bipolar 1 disorder, depressed, severe (HCC) 07/04/2024   Arthritis of left acromioclavicular joint 04/14/2024   Chronic pain in left shoulder 11/01/2023   Calcific supraspinatus tendonitis 10/03/2023   Bursitis of right shoulder 10/03/2023   Arthritis of right acromioclavicular joint 10/03/2023   S/P arthroscopy of right shoulder 09/27/2023   Persistent headaches 01/18/2023   Blurry vision 01/18/2023   Weight gain 01/18/2023   SBO (small bowel obstruction) (HCC) 09/13/2022   Nausea and vomiting 09/11/2022   Partial small bowel obstruction (HCC) 09/10/2022   Piriformis syndrome of both sides 05/31/2019   History of bipolar disorder 04/18/2019   Tardive dyskinesia 04/18/2019   Urticaria due to drug allergy 04/18/2019    Lumbar spondylosis 02/05/2019   Bipolar affective disorder, current episode mixed (HCC) 01/02/2019   Arthritis 01/02/2019   Cigarette nicotine  dependence without complication 01/02/2019   Bilateral temporomandibular joint pain 10/02/2018   Referred otalgia, bilateral 10/02/2018   Paresthesias 03/09/2018   Chronic migraine without aura, intractable, with status migrainosus 04/22/2016   Bipolar 1 disorder, mixed, moderate (HCC) 07/15/2015    Class: Chronic   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   Sciatica 01/09/2015   Reactive hypoglycemia 12/17/2013   Musculoskeletal malfunction arising from mental factors 04/04/2013   Incisional hernia 11/15/2012   Other postprocedural status(V45.89) 11/15/2012   S/P hernia surgery 11/15/2012   Abdominal pain 11/11/2012   Chronic pain syndrome 11/11/2012   Disorder of sacrum 11/11/2012   Chronic pain 12/21/2011   Heartburn 10/04/2011   DYSPHAGIA 10/04/2011   Depression with anxiety 09/15/2010   CONSTIPATION 09/15/2010   LUNG NODULE 06/17/2010   Herpes simplex virus (HSV) infection 06/11/2010   Fatigue 06/11/2010   STRICTURE AND STENOSIS OF ESOPHAGUS 05/13/2010   ABDOMINAL WALL HERNIA 02/27/2010   ENDOMETRIOSIS 12/16/2009   GERD 10/23/2009   PALPITATIONS 10/23/2009   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   DYSTHYMIC DISORDER 06/20/2009   CONDYLOMA ACUMINATUM 04/23/2009   LENTIGO 04/23/2009   DENTAL PAIN 03/19/2009   CERVICAL RADICULOPATHY 12/11/2008   Cervical radiculopathy 12/11/2008   PANIC DISORDER WITHOUT AGORAPHOBIA 08/07/2008   TOBACCO ABUSE 08/07/2008   BACK PAIN, THORACIC REGION 07/07/2007   ALLERGIC RHINITIS 06/02/2007   Past Medical History:  Diagnosis Date   ABDOMINAL WALL HERNIA 02/27/2010   Abnormal Pap smear    ALLERGIC RHINITIS 06/02/2007   ANOREXIA, CHRONIC 08/07/2008   ANXIETY 09/15/2010   Arthritis    BACK PAIN, THORACIC REGION 07/07/2007   Bipolar disorder (HCC)    CERVICAL RADICULOPATHY  12/11/2008   Complication of anesthesia    Pateint reprots a high tolerance   Complication of anesthesia    pt states she has not had her voice completely back since surgery in Nov. 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   Eating disorder    ENDOMETRIOSIS 12/16/2009   FATIGUE 06/11/2010   Fibromyalgia    GERD 10/23/2009   Heart palpitations    HERPES SIMPLEX INFECTION 06/11/2010   LENTIGO 04/23/2009   LUNG NODULE 06/17/2010   Narcotic abuse, continuous (HCC)    pt denies   Ovarian cyst    Palpitations 10/23/2009   Panic disorder    Rheumatoid arthritis(714.0)    Stricture and stenosis of esophagus 05/13/2010   TOBACCO ABUSE 08/07/2008   Urinary tract infection    UTI (urinary tract  infection)     Family History  Problem Relation Age of Onset   Depression Mother    Arthritis Mother    Hyperlipidemia Father    Hypertension Father    Heart attack Father    Heart disease Father    Cancer Father    Diabetes Father    Stroke Father     Past Surgical History:  Procedure Laterality Date   ABDOMINAL SURGERY     ABDOMINAL WALL MESH  REMOVAL     ABLATION ON ENDOMETRIOSIS     CESAREAN SECTION     x2    CYSTOSCOPY N/A 11/22/2023   Procedure: CYSTOSCOPY;  Surgeon: Elisabeth Valli BIRCH, MD;  Location: [facility];  Service: Urology;  Laterality: N/A;   DILATION AND CURETTAGE OF UTERUS     x1    ENDOMETRIAL ABLATION     esophageal dilatation x 4     GUM SURGERY     HERNIA REPAIR     X3   PARTIAL HYSTERECTOMY     POSTERIOR LUMBAR FUSION 2 WITH HARDWARE REMOVAL Left 03/29/2024   Procedure: ARTHROSCOPY, SHOULDER WITH DEBRIDEMENT;  Surgeon: Addie Cordella Hamilton, MD;  Location: [facility] OR;  Service: Orthopedics;  Laterality: Left;  LEFT SHOULDER ARTHROSCOPY, DEBRIDEMENT, DISTAL CLAVICLE EXCISION   RESECTION DISTAL CLAVICAL Left 03/29/2024   Procedure: EXCISION, CLAVICLE, DISTAL, OPEN;  Surgeon: Addie Cordella Hamilton, MD;  Location: [facility] OR;  Service: Orthopedics;  Laterality: Left;   SHOULDER SURGERY Right 09/2023   Social History   Occupational History   Not on file  Tobacco Use   Smoking status: Former    Current packs/day: 0.20    Types: Cigarettes   Smokeless tobacco: Never   Tobacco comments:    2-3 cigarettes per day as of May 2025  Vaping Use   Vaping status: Some Days  Substance and Sexual Activity   Alcohol use: No   Drug use: No   Sexual activity: Yes    Birth control/protection: Surgical

## 2024-09-07 NOTE — Telephone Encounter (Signed)
 Called patient and left voicemail to call the office to schedule appointment with Dr. Burnetta. 30-min appointment for evaluation and treatment.

## 2024-10-02 ENCOUNTER — Encounter (HOSPITAL_COMMUNITY): Payer: Self-pay

## 2024-10-02 ENCOUNTER — Emergency Department (HOSPITAL_BASED_OUTPATIENT_CLINIC_OR_DEPARTMENT_OTHER)
Admission: EM | Admit: 2024-10-02 | Discharge: 2024-10-02 | Disposition: A | Discharge status: Home / Self Care | Attending: Emergency Medicine | Admitting: Emergency Medicine

## 2024-10-02 ENCOUNTER — Other Ambulatory Visit: Payer: Self-pay

## 2024-10-02 ENCOUNTER — Ambulatory Visit: Payer: Self-pay

## 2024-10-02 ENCOUNTER — Emergency Department (HOSPITAL_COMMUNITY)
Admission: EM | Admit: 2024-10-02 | Discharge: 2024-10-02 | Disposition: A | Discharge status: Home / Self Care | Attending: Emergency Medicine | Admitting: Emergency Medicine

## 2024-10-02 DIAGNOSIS — R52 Pain, unspecified: Secondary | ICD-10-CM | POA: Insufficient documentation

## 2024-10-02 DIAGNOSIS — F17200 Nicotine dependence, unspecified, uncomplicated: Secondary | ICD-10-CM | POA: Insufficient documentation

## 2024-10-02 DIAGNOSIS — M791 Myalgia, unspecified site: Secondary | ICD-10-CM | POA: Insufficient documentation

## 2024-10-02 LAB — COMPREHENSIVE METABOLIC PANEL WITH GFR
ALT: 14 U/L (ref 0–44)
AST: 17 U/L (ref 15–41)
Albumin: 4.1 g/dL (ref 3.5–5.0)
Alkaline Phosphatase: 133 U/L — ABNORMAL HIGH (ref 38–126)
Anion gap: 12 (ref 5–15)
BUN: 15 mg/dL (ref 6–20)
CO2: 22 mmol/L (ref 22–32)
Calcium: 9.4 mg/dL (ref 8.9–10.3)
Chloride: 101 mmol/L (ref 98–111)
Creatinine, Ser: 0.52 mg/dL (ref 0.44–1.00)
GFR, Estimated: >60 mL/min (ref >60)
Glucose, Bld: 116 mg/dL — ABNORMAL HIGH (ref 70–99)
Potassium: 4.1 mmol/L (ref 3.5–5.1)
Sodium: 135 mmol/L (ref 135–145)
Total Bilirubin: 0.3 mg/dL (ref 0.0–1.2)
Total Protein: 6.6 g/dL (ref 6.5–8.1)

## 2024-10-02 LAB — CBC
HCT: 43.1 % (ref 36.0–46.0)
Hemoglobin: 14.9 g/dL (ref 12.0–15.0)
MCH: 31.5 pg (ref 26.0–34.0)
MCHC: 34.6 g/dL (ref 30.0–36.0)
MCV: 91.1 fL (ref 80.0–100.0)
Platelets: 271 K/uL (ref 150–400)
RBC: 4.73 MIL/uL (ref 3.87–5.11)
RDW: 12.3 % (ref 11.5–15.5)
WBC: 8.6 K/uL (ref 4.0–10.5)
nRBC: 0 % (ref 0.0–0.2)

## 2024-10-02 MED ORDER — ACETAMINOPHEN 500 MG PO TABS
1000.0000 mg | ORAL_TABLET | Freq: Once | ORAL | Status: AC
  Administered 2024-10-02: 1000 mg via ORAL
  Filled 2024-10-02: qty 2

## 2024-10-02 NOTE — ED Notes (Signed)
Urine sample still needed

## 2024-10-02 NOTE — ED Provider Notes (Signed)
 My initial plan was haldol  and lidoderm  but patient is allergic to both.  Patient is allergic to non- opioid pain medication.  Patient called her PMD earlier in the day for pain medication.  Patient seen by Dr. Emil.  Patient has 30 allergies and a controlled medication caution.  Patient reports she was not seen tonight.  Had labs which are benign and reassuring.  Patient now states she saw a doctor six months ago and she did not like him.  Patient lied to me.  Multiple visits for same is with lying is consistent with drug seeking and I will give a dose of tylenol .  I will not being providing narcotics as I do not believe this is an acute emergency.  Patient is well appearing with normal vital signs.     Gina Kolbe, MD 10/02/24 2315

## 2024-10-02 NOTE — Telephone Encounter (Signed)
 FYI Only or Action Required?: Action required by provider: pt is requesting pain medication be called into [facility] on [address] for fibromyalgia. Pt would like a call back regarding medication.  Patient was last seen in primary care on 07/30/2024 by Jodie Lavern CROME, MD.  Called Nurse Triage reporting Pain.  Symptoms began several days ago.  Interventions attempted: Rest, hydration, or home remedies.  Symptoms are: gradually worsening.  Triage Disposition: See HCP Within 4 Hours (Or PCP Triage)  Patient/caregiver understands and will follow disposition?: No, refuses disposition - pt will only see pcp.                     Copied from CRM [id number]. Topic: Clinical - Red Word Triage >> Oct 02, 2024  2:43 PM Anairis L wrote: Red Word that prompted transfer to Nurse Triage: Fibromyalgia flare up, severe pain. Can't even go to the restroom unassisted. Reason for Disposition  [1] SEVERE pain (e.g., excruciating, unable to do any normal activities) AND [2] not improved 2 hours after pain medicine  Answer Assessment - Initial Assessment Questions 1. ONSET: When did the muscle aches or body pains start?      Over the weekend 2. LOCATION: What part of your body is hurting? (e.g., entire body, arms, legs)      everywhere 3. SEVERITY: How bad is the pain? (Scale 1-10; or mild, moderate, severe)     10/10 4. CAUSE: What do you think is causing the pains?     Fibromyalgia  Protocols used: Muscle Aches and Body Pain-A-AH

## 2024-10-02 NOTE — ED Notes (Signed)
 Patient verbalizes understanding of discharge instructions. Opportunity for questioning and answers were provided. Armband removed by staff, pt discharged from ED. Wheeled out to lobby

## 2024-10-02 NOTE — ED Notes (Signed)
Pt stated she is unable to give urine sample at this time.  

## 2024-10-02 NOTE — ED Provider Notes (Signed)
 [place] EMERGENCY DEPARTMENT AT [facility] Provider Note   CSN: [id number] Arrival date & time: 10/02/24  [id number]     Patient presents with: Generalized Body Aches   Gina [name] is a 48 y.o. female.   48 yo F with a chief complaints of pain all of her body.  She tells me that everything that she heard in the past year is now hurting her worse than it has previously.  Going on for the past few days.  She thinks maybe she is having a flare of her fibromyalgia.  She says she has had trouble with symptoms going on for years and it seemed to get suddenly better a couple months ago when her husband left her.  She said that her daughter was recently sick and in the hospital and she spent the weekend with her and she thinks the stress of that and sleeping in a chair maybe has made her pain worse.        Prior to Admission medications   Medication Sig Start Date End Date Taking? Authorizing Provider  calcium carbonate (TUMS - DOSED IN MG ELEMENTAL CALCIUM) 500 MG chewable tablet Chew 1 tablet by mouth 4 (four) times daily as needed for indigestion or heartburn.    [provider]  Cholecalciferol (VITAMIN D3 PO) Take 2 each by mouth daily. Gummy    [provider]  cholestyramine  (QUESTRAN ) 4 g packet Take 1 packet (4 g total) by mouth 3 (three) times daily with meals. 07/18/24   [name], MD  diazepam  (VALIUM ) 5 MG tablet Take 1 tablet (5 mg total) by mouth 2 (two) times daily as needed for muscle spasms. 07/12/24   Haviland, Julie, MD  EC-RX Testosterone 0.2 % CREA Place onto the skin.    [provider]  escitalopram  (LEXAPRO ) 10 MG tablet Take 10 mg by mouth daily. 07/10/24   [provider]  hyoscyamine  (LEVSIN  SL) 0.125 MG SL tablet Place 0.125 mg under the tongue every 4 (four) hours as needed for cramping.    [provider]  LORazepam  (ATIVAN ) 1 MG tablet Take 1 mg by mouth every 8 (eight) hours as needed for anxiety.     [provider]  lurasidone  (LATUDA ) 40 MG TABS tablet Take 40 mg by mouth every evening. 12/16/22   [provider]  methocarbamol  (ROBAXIN ) 500 MG tablet TAKE 1 TABLET BY MOUTH EVERY 8 HOURS AS NEEDED FOR UP TO 7 DAYS 05/17/24   [name], NP  promethazine  (PHENERGAN ) 25 MG tablet Take 25 mg by mouth every 6 (six) hours as needed for nausea or vomiting.    [provider]    Allergies: Lamictal [lamotrigine], Maxalt  [rizatriptan ], Morphine  and codeine , Nicotine , Wellbutrin  [bupropion ], Zofran , Chantix  [varenicline  tartrate], Ciprofloxacin , Codeine , Haldol  [haloperidol ], Ibuprofen, Lidoderm  [lidocaine ], Skelaxin [metaxalone], Sulfa antibiotics, Sulfasalazine, Toradol  [ketorolac  tromethamine ], Ambien [zolpidem], Doxycycline , Duloxetine, Ingrezza [valbenazine tosylate], Lyrica [pregabalin], Methadone, Neurontin  [gabapentin ], Other, Prednisone , Amoxicillin, Aripiprazole, Chlorhexidine , Penicillins, and Tramadol     Review of Systems  Updated Vital Signs BP 119/79   Pulse 64   Temp 98.5 F (36.9 C)   Resp 16   Ht 5' 7 (1.702 m)   Wt 97.5 kg   LMP 01/01/2004   SpO2 98%   BMI 33.67 kg/m   Physical Exam Vitals and nursing note reviewed.  Constitutional:      General: She is not in acute distress.    Appearance: She is well-developed. She is not diaphoretic.  HENT:  Head: Normocephalic and atraumatic.  Eyes:     Pupils: Pupils are equal, round, and reactive to light.  Cardiovascular:     Rate and Rhythm: Normal rate and regular rhythm.     Heart sounds: No murmur heard.    No friction rub. No gallop.  Pulmonary:     Effort: Pulmonary effort is normal.     Breath sounds: No wheezing or rales.  Abdominal:     General: There is no distension.     Palpations: Abdomen is soft.     Tenderness: There is no abdominal tenderness.  Musculoskeletal:        General: No tenderness.     Cervical back: Normal range of motion and neck supple.  Skin:     General: Skin is warm and dry.  Neurological:     Mental Status: She is alert and oriented to person, place, and time.  Psychiatric:        Behavior: Behavior normal.     (all labs ordered are listed, but only abnormal results are displayed) Labs Reviewed  COMPREHENSIVE METABOLIC PANEL WITH GFR - Abnormal; Notable for the following components:      Result Value   Glucose, Bld 116 (*)    Alkaline Phosphatase 133 (*)    All other components within normal limits  CBC  URINALYSIS, ROUTINE W REFLEX MICROSCOPIC  CBG MONITORING, ED    EKG: None  Radiology: No results found.   Procedures   Medications Ordered in the ED - No data to display                                  Medical Decision Making Amount and/or Complexity of Data Reviewed Labs: ordered.   48 yo F with a chief complaints of diffuse arthralgias.  Tells me that it hurts in her shoulders and knees and she thinks that everything she had injured in the past year is suddenly hurting again.  I had a discussion with the patient at bedside.  She told me that she needed to get out of this pain acutely.  When asked what typically works she said IV pain medicine.    She is not tachycardic not tachypneic has no obvious focal area of discomfort on my exam.  I discussed with her about my hesitance to give her IV narcotics here.  I did offer to treat her with a nonnarcotic option.   I was notified by nursing that the patient was firing me and wanted a different provider.    I find this concerning for narcotic seeking behavior.   Will discharge the patient.  Encouraged PCP follow-up.  8:26 PM:  I have discussed the diagnosis/risks/treatment options with the patient.  Evaluation and diagnostic testing in the emergency department does not suggest an emergent condition requiring admission or immediate intervention beyond what has been performed at this time.  They will follow up with PCP. We also discussed returning to the ED  immediately if new or worsening sx occur. We discussed the sx which are most concerning (e.g., sudden worsening pain, fever, inability to tolerate by mouth) that necessitate immediate return. Medications administered to the patient during their visit and any new prescriptions provided to the patient are listed below.  Medications given during this visit Medications - No data to display   The patient appears reasonably screen and/or stabilized for discharge and I doubt any other medical condition or other  EMC requiring further screening, evaluation, or treatment in the ED at this time prior to discharge.       Final diagnoses:  Diffuse pain    ED Discharge Orders     None          Emil Share, DO 10/02/24 2026

## 2024-10-02 NOTE — ED Triage Notes (Signed)
 Pt POV reporting gen body aches that began today, concerned for fibromyalgia flare up.

## 2024-10-02 NOTE — Telephone Encounter (Signed)
 Please see pt triage note and advise

## 2024-10-02 NOTE — ED Triage Notes (Signed)
 Pt states she woke up this morning having pain all over, everywhere I've ever had surgery or an injury, I have fibromyalgia  State daughter was here this weekend and It was too much on my body and too stressful  States she went to PT but no relief, took tylenol  and tried heating pads PTA. States she went to [facility] and the doctor I saw was the same one I saw 6 months ago and he's the worst one I have ever seen and wrote a complaint, requested another doctor, so I left  Pt states driven here by mother, states cannot take Lyrica or ibuprofen.  States took Robaxin  and Oxy with no relief

## 2024-10-02 NOTE — ED Notes (Signed)
 ED Provider at bedside.

## 2024-10-02 NOTE — ED Provider Notes (Signed)
 [place] EMERGENCY DEPARTMENT AT [facility] Provider Note   CSN: [id number] Arrival date & time: 10/02/24  2116     Gina [name] is a 48 y.o. female. Patient with fibromyalgia and Bipolar disorder who reports she is here taking care of her daughter who has sepsis and awoke with widespread pain at the sites of all previous surgeries.    The history is provided by the patient.      Past Medical History:  Diagnosis Date   ABDOMINAL WALL HERNIA 02/27/2010   Abnormal Pap smear    ALLERGIC RHINITIS 06/02/2007   ANOREXIA, CHRONIC 08/07/2008   ANXIETY 09/15/2010   Arthritis    BACK PAIN, THORACIC REGION 07/07/2007   Bipolar disorder (HCC)    CERVICAL RADICULOPATHY 12/11/2008   Complication of anesthesia    Pateint reprots a high tolerance   Complication of anesthesia    pt states she has not had her voice completely back since surgery in Nov. 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 Authorizing Provider  calcium carbonate (TUMS - DOSED IN MG ELEMENTAL CALCIUM) 500 MG chewable tablet Chew 1 tablet by mouth 4 (four) times daily as needed for indigestion or heartburn.    [provider]  Cholecalciferol (VITAMIN D3 PO) Take 2 each by mouth daily. Gummy    [provider]  cholestyramine   (QUESTRAN ) 4 g packet Take 1 packet (4 g total) by mouth 3 (three) times daily with meals. 07/18/24   [name], MD  diazepam  (VALIUM ) 5 MG tablet Take 1 tablet (5 mg total) by mouth 2 (two) times daily as needed for muscle spasms. 07/12/24   Haviland, Julie, MD  EC-RX Testosterone 0.2 % CREA Place onto the skin.    [provider]  escitalopram  (LEXAPRO ) 10 MG tablet Take 10 mg by mouth daily. 07/10/24   [provider]  hyoscyamine  (LEVSIN  SL) 0.125 MG SL tablet Place 0.125 mg under the tongue every 4 (four) hours as needed for cramping.    [provider]  LORazepam  (ATIVAN ) 1 MG tablet Take 1 mg by mouth every 8 (eight) hours as needed for anxiety.    [provider]  lurasidone  (LATUDA ) 40 MG TABS tablet Take 40 mg by mouth every evening. 12/16/22   [provider]  methocarbamol  (ROBAXIN ) 500 MG tablet TAKE 1 TABLET BY MOUTH EVERY 8 HOURS AS NEEDED FOR UP TO 7 DAYS 05/17/24   [name], NP  promethazine  (PHENERGAN ) 25 MG tablet Take 25 mg by mouth every 6 (six) hours as needed for nausea or vomiting.    [provider]    Allergies: Lamictal [lamotrigine], Maxalt  [rizatriptan ], Morphine  and codeine , Nicotine , Wellbutrin  [bupropion ], Zofran , Chantix  [varenicline  tartrate], Ciprofloxacin , Codeine , Haldol  [haloperidol ], Ibuprofen, Lidoderm  [  lidocaine ], Skelaxin [metaxalone], Sulfa antibiotics, Sulfasalazine, Toradol  [ketorolac  tromethamine ], Ambien [zolpidem], Doxycycline , Duloxetine, Ingrezza [valbenazine tosylate], Lyrica [pregabalin], Methadone, Neurontin  [gabapentin ], Other, Prednisone , Amoxicillin, Aripiprazole, Chlorhexidine , Penicillins, and Tramadol     Review of Systems  Constitutional:  Negative for fever.  Respiratory:  Negative for wheezing and stridor.   All other systems reviewed and are negative.   Updated Vital Signs BP (!) 132/112 (BP Location: Right Arm)   Pulse 82   Temp 98.7 F (37.1 C) (Oral)   Resp 18    Ht 5' 7 (1.702 m)   Wt 97.5 kg   LMP 01/01/2004   SpO2 99%   BMI 33.67 kg/m   Physical Exam Vitals and nursing note reviewed.  Constitutional:      General: She is not in acute distress.    Appearance: Normal appearance. She is well-developed.  HENT:     Head: Normocephalic and atraumatic.     Nose: Nose normal.  Eyes:     Extraocular Movements: Extraocular movements intact.  Cardiovascular:     Rate and Rhythm: Normal rate and regular rhythm.     Pulses: Normal pulses.     Heart sounds: Normal heart sounds.  Pulmonary:     Effort: Pulmonary effort is normal. No respiratory distress.     Breath sounds: Normal breath sounds.  Abdominal:     General: Bowel sounds are normal. There is no distension.     Palpations: Abdomen is soft.     Tenderness: There is no abdominal tenderness. There is no guarding or rebound.  Musculoskeletal:        General: Normal range of motion.     Cervical back: Normal range of motion.  Skin:    General: Skin is warm and dry.     Capillary Refill: Capillary refill takes less than 2 seconds.     Findings: No erythema or rash.  Neurological:     General: No focal deficit present.     Mental Status: She is alert.     Deep Tendon Reflexes: Reflexes normal.  Psychiatric:        Thought Content: Thought content normal.     (all labs ordered are listed, but only abnormal results are displayed) Labs Reviewed - No data to display Results for orders placed or performed during the hospital encounter of 10/02/24  Comprehensive metabolic panel   Collection Time: 10/02/24  7:00 PM  Result Value Ref Range   Sodium 135 135 - 145 mmol/L   Potassium 4.1 3.5 - 5.1 mmol/L   Chloride 101 98 - 111 mmol/L   CO2 22 22 - 32 mmol/L   Glucose, Bld 116 (H) 70 - 99 mg/dL   BUN 15 6 - 20 mg/dL   Creatinine, Ser 9.47 0.44 - 1.00 mg/dL   Calcium 9.4 8.9 - 89.6 mg/dL   Total Protein 6.6 6.5 - 8.1 g/dL   Albumin 4.1 3.5 - 5.0 g/dL   AST 17 15 - 41 U/L   ALT 14 0 -  44 U/L   Alkaline Phosphatase 133 (H) 38 - 126 U/L   Total Bilirubin 0.3 0.0 - 1.2 mg/dL   GFR, Estimated >39 >39 mL/min   Anion gap 12 5 - 15  CBC   Collection Time: 10/02/24  7:00 PM  Result Value Ref Range   WBC 8.6 4.0 - 10.5 K/uL   RBC 4.73 3.87 - 5.11 MIL/uL   Hemoglobin 14.9 12.0 - 15.0 g/dL   HCT 56.8 63.9 - 53.9 %   MCV  91.1 80.0 - 100.0 fL   MCH 31.5 26.0 - 34.0 pg   MCHC 34.6 30.0 - 36.0 g/dL   RDW 87.6 88.4 - 84.4 %   Platelets 271 150 - 400 K/uL   nRBC 0.0 0.0 - 0.2 %   XR KNEE 3 VIEW LEFT Result Date: 09/06/2024 AP, lateral, sunrise views of left knee reviewed.  No fracture or dislocation.  No significant degenerative changes noted of the left knee.  Prior inferior pole patellar fracture appears fully healed with no residual fracture line.   EKG: None  Radiology: No results found.   Procedures   Medications Ordered in the ED  acetaminophen  (TYLENOL ) tablet 1,000 mg (1,000 mg Oral Given 10/02/24 2307)                                    Medical Decision Making Patient presents with widespread pain   Amount and/or Complexity of Data Reviewed External Data Reviewed: labs and notes.    Details: Labs from earlier today reviewed  Labs:     Details: Normal white count 8.6, normal hemoglobin 14.9, normal platelets.  Normal sodium 135, normal potassium 4.1, normal creatinine   Risk OTC drugs.     Final diagnoses:  None   No signs of systemic illness or infection. The patient is nontoxic-appearing on exam and vital signs are within normal limits.  I have reviewed the triage vital signs and the nursing notes. Pertinent labs & imaging results that were available during my care of the patient were reviewed by me and considered in my medical decision making (see chart for details). After history, exam, and medical workup I feel the patient has been appropriately medically screened and is safe for discharge home. Pertinent diagnoses were discussed with the  patient. Patient was given return precautions.    ED Discharge Orders     None          Nasif Bos, MD 10/02/24 2323

## 2024-10-02 NOTE — Telephone Encounter (Signed)
 FYI Only or Action Required?: Action required by provider: request for appointment, clinical question for provider, and update on Gina [name] condition.  Gina [name] was last seen in primary care on 07/30/2024 by Jodie Lavern CROME, MD.  Called Nurse Triage reporting Pain.  Symptoms began several days ago.  Interventions attempted: OTC medications: tylenol , Prescription medications: Gina [name] states muscle relaxer, Rest, hydration, or home remedies, and Ice/heat application.  Symptoms are: rapidly worsening.  Triage Disposition: See HCP Within 4 Hours (Or PCP Triage)  Gina [name]/caregiver understands and will follow disposition?: No, wishes to speak with PCP         Copied from CRM #[id number]. Topic: Clinical - Red Word Triage >> Oct 02, 2024  8:48 AM Pinkey ORN wrote: Red Word that prompted transfer to Nurse Triage: Severe Pain >> Oct 02, 2024  8:51 AM Pinkey ORN wrote: Gina [name] states her entire body is in extreme pain, states this is the outcome whenever she is stressed.  Reason for Disposition  [1] SEVERE pain (e.g., excruciating, unable to do any normal activities) AND [2] not improved 2 hours after pain medicine  Answer Assessment - Initial Assessment Questions Gina [name] states that she has fibromyalgia She states her daughter was in the hospital over the weekend and Gina [name] had to sleep in a chair & states her stress was elevated(having to see her ex)--pain started while she was with her daughter and last night Gina [name] started having worse pain --taking muscle relaxer and Tylenol --states not helping Gina [name] is offered an appointment at her PCP office with a different provider than her PCP Dr [name] but Gina [name] states she only wants to see her PCP due to him being familiar with her and her medical history Gina [name] states that her entire body is hurting and she sounds like she is in pain during triage and states she is even using heating pads at this time and nothing is helping. She states  that every old injury that she has is hurting at this time  Gina [name] is advised to call us  back if anything changes or with any further questions/concerns. Gina [name] is advised that if anything worsens to go to the Emergency Room. Gina [name] states she is not going to the Emergency Room and she just would like see her PCP Dr [name] or receive his advice at this time     1. ONSET: When did the muscle aches or body pains start?      In the past few days 5. FEVER: Do you have a fever? If Yes, ask: What is your temperature, how was it measured, and  when did it start?      Denies  7. PREGNANCY: Is there any chance you are pregnant? When was your last menstrual period?     Denies  Protocols used: Muscle Aches and Body Pain-A-AH

## 2024-10-02 NOTE — Discharge Instructions (Signed)
 Follow-up with your doctor in the office.  Please return anytime you would like to be reevaluated.

## 2024-10-04 ENCOUNTER — Telehealth: Payer: Self-pay | Admitting: Internal Medicine

## 2024-10-04 ENCOUNTER — Encounter: Payer: Self-pay | Admitting: Internal Medicine

## 2024-10-04 ENCOUNTER — Telehealth: Admitting: Internal Medicine

## 2024-10-04 ENCOUNTER — Ambulatory Visit: Admitting: Surgical

## 2024-10-04 DIAGNOSIS — F3162 Bipolar disorder, current episode mixed, moderate: Secondary | ICD-10-CM | POA: Diagnosis not present

## 2024-10-04 DIAGNOSIS — M797 Fibromyalgia: Secondary | ICD-10-CM

## 2024-10-04 DIAGNOSIS — B001 Herpesviral vesicular dermatitis: Secondary | ICD-10-CM | POA: Diagnosis not present

## 2024-10-04 MED ORDER — NALTREXONE POWD: 5.0000 mg | Freq: Every day | 2 refills | Status: DC

## 2024-10-04 MED ORDER — OXYCODONE-ACETAMINOPHEN 5-325 MG PO TABS: 1.0000 | ORAL_TABLET | ORAL | 0 refills | Status: AC | PRN

## 2024-10-04 MED ORDER — VALACYCLOVIR HCL 500 MG PO TABS: 500.0000 mg | ORAL_TABLET | Freq: Every day | ORAL | 4 refills | Status: AC

## 2024-10-04 MED ORDER — NALTREXONE POWD: 5.0000 mg | Freq: Every day | 0 refills | Status: DC

## 2024-10-04 MED ORDER — NALTREXONE POWD: 5.0000 mg | Freq: Every day | 2 refills | Status: AC

## 2024-10-04 NOTE — Telephone Encounter (Signed)
 Copied from CRM #[id number]. Topic: Clinical - Prescription Issue >> Oct 04, 2024 11:38 AM Darshell M wrote: Reason for CRM: Pharmacy unable to fill the Naltrexone as it is a compound and [facility] is not a set designer.

## 2024-10-04 NOTE — Addendum Note (Signed)
 Addended by: Deryk Bozman G on: 10/04/2024 07:53 PM   Modules accepted: Orders

## 2024-10-04 NOTE — Assessment & Plan Note (Signed)
 Bipolar disorder and major depressive disorder   Current treatment includes high-dose Lexapro  and Latuda . Emotional distress and stress likely exacerbate symptoms. 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 TMS is also considered/discussed. Insurance coverage for ketamine  and TMS may require additional documentation. She received a list of ketamine  therapy providers and discussed TMS as an alternative. Advised on potential insurance challenges and the need for documentation of treatm

## 2024-10-04 NOTE — Progress Notes (Signed)
 ==============================  [facility]: [phone]   Virtual Medical Office Visit - Video Telemedicine   Patient:  Gina [name] (1976/06/19) located at home MRN:   [id number]      Date:   10/04/2024  PCP:    [name], MD   Today's Healthcare Provider: Bernardino MATSU Jesus, MD located at office: [facility] [address], [place] [address] Today's Telemedicine visit was conducted via Video after consent for telemedicine was obtained:  Video connection was never lost All video encounter participant identities and locations confirmed visually and verbally.   Chief Complaint: Hospitalization Follow-up  Discussed the use of AI scribe software for clinical note transcription with the patient, who gave verbal consent to proceed.  History of Present Illness 48 year old female with fibromyalgia who presents with severe pain following recent stress and physical exertion.  She experiences severe pain that began after a period of high stress and physical exertion, described as feeling like 'every injury that I've had in the last year,' affecting her shoulders, lower back, knee, and foot. The pain intensity was such that she was unable to move except to use the bathroom on Tuesday.  A recent stressful event involved her estranged daughter, who was hospitalized with a kidney infection that progressed to sepsis. She stayed with her daughter in the hospital, sleeping in a chair and experiencing significant emotional and physical stress. This was compounded by an encounter with her estranged husband, which she found distressing.  She has a history of mental health issues and is currently taking Lexapro  20 mg, Latuda  40 mg, and Ativan . She has previously been on Wellbutrin , which was discontinued due to adverse effects. She mentions a past hospitalization for depression and a history of medication adjustments,  including a switch from Lexapro  to Wellbutrin , which was problematic.  She has been making lifestyle changes, including quitting smoking and improving her diet and exercise routine, which had been beneficial until the recent events. Despite these efforts, the recent stress has significantly impacted her mental and physical health.  She has a history of fibromyalgia and reports that her current pain is severe and widespread. She has been using muscle relaxers and Tylenol  for pain management but reports that these have been insufficient.  She expresses frustration with recent ER visits, where she felt her concerns were not adequately addressed. She describes interactions with medical staff as dismissive and unhelpful, leading to her discharge without satisfactory treatment.  Her social history includes significant stress related to family dynamics, particularly with her daughters and estranged husband. Her youngest daughter is currently living with her part-time and is causing additional stress.  It seems emotional stressors are tightly linking to flares of severe all over pain consistent with allodynia/fibromyalgia, and she is allergic or history of adverse reaction to all but opioids for pain management. She does feel tylenol  helps but is woefully inadequate alone for severe pain flares.  Background Reviewed: Problem List: has Herpes simplex virus (HSV) infection; CONDYLOMA ACUMINATUM; Depression with anxiety; PANIC DISORDER WITHOUT AGORAPHOBIA; PANIC DISORDER WITH AGORAPHOBIA; DYSTHYMIC DISORDER; TOBACCO ABUSE; ALLERGIC RHINITIS; LUNG NODULE; DENTAL PAIN; STRICTURE AND STENOSIS OF ESOPHAGUS; GERD; ABDOMINAL WALL HERNIA; CONSTIPATION; ENDOMETRIOSIS; LENTIGO; CERVICAL RADICULOPATHY; BACK PAIN, THORACIC REGION; Fatigue; PALPITATIONS; Musculoskeletal malfunction arising from mental factors; Abdominal pain; Heartburn; Chronic pain syndrome; Chronic pain; DYSPHAGIA; Incisional hernia; Other postprocedural  status(V45.89); Disorder of sacrum; Reactive hypoglycemia; Sciatica; 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; Bipolar 1  disorder, mixed, moderate (HCC); Bipolar affective disorder, current episode mixed (HCC); Arthritis; Cigarette nicotine  dependence without complication; Bilateral temporomandibular joint pain; Cervical radiculopathy; Chronic migraine without aura, intractable, with status migrainosus; History of bipolar disorder; Lumbar spondylosis; Paresthesias; Piriformis syndrome of both sides; Referred otalgia, bilateral; S/P hernia surgery; Tardive dyskinesia; Urticaria due to drug allergy; Partial small bowel obstruction (HCC); Nausea and vomiting; SBO (small bowel obstruction) (HCC); Persistent headaches; Blurry vision; Weight gain; S/P arthroscopy of right shoulder; Calcific supraspinatus tendonitis; Bursitis of right shoulder; Arthritis of right acromioclavicular joint; Chronic pain in left shoulder; Arthritis of left acromioclavicular joint; Bipolar 1 disorder, depressed, severe (HCC); Cushing's syndrome; Bile salt-induced diarrhea; Status post cholecystectomy; Hepatic steatosis; Flushing; Allodynia; Low testosterone; Fibromyalgia; and Intolerance, drug on their problem list. Past Medical History:  has a past medical history of ABDOMINAL WALL HERNIA (02/27/2010), Abnormal Pap smear, ALLERGIC RHINITIS (06/02/2007), ANOREXIA, CHRONIC (08/07/2008), ANXIETY (09/15/2010), Arthritis, BACK PAIN, THORACIC REGION (07/07/2007), Bipolar disorder (HCC), CERVICAL RADICULOPATHY (12/11/2008), Complication of anesthesia, Complication of anesthesia, Condyloma acuminatum (04/23/2009), CONSTIPATION (09/15/2010), DYSPHAGIA UNSPECIFIED (09/09/2009), Dysthymic disorder (06/20/2009), Eating disorder, ENDOMETRIOSIS (12/16/2009), FATIGUE (06/11/2010), Fibromyalgia, GERD (10/23/2009), Heart palpitations, HERPES SIMPLEX INFECTION (06/11/2010), LENTIGO (04/23/2009), LUNG NODULE (06/17/2010), Narcotic abuse, continuous (HCC),  Ovarian cyst, Palpitations (10/23/2009), Panic disorder, Rheumatoid arthritis(714.0), Stricture and stenosis of esophagus (05/13/2010), TOBACCO ABUSE (08/07/2008), Urinary tract infection, and UTI (urinary tract infection). Past Surgical History:   has a past surgical history that includes Partial hysterectomy; Endometrial ablation; Hernia repair; Dilation and curettage of uterus; Cesarean section; esophageal dilatation x 4; Gum surgery; Abdominal surgery; Abdominal wall mesh  removal; Ablation on endometriosis; Shoulder surgery (Right, 09/2023); Cystoscopy (N/A, 11/22/2023); Posterior lumbar fusion 2 with hardware removal (Left, 03/29/2024); and Resection distal clavical (Left, 03/29/2024). Social History:   reports that she has quit smoking. Her smoking use included cigarettes. She has never used smokeless tobacco. She reports that she does not drink alcohol and does not use drugs. Family History:  family history includes Arthritis in her mother; Cancer in her father; Depression in her mother; Diabetes in her father; Heart attack in her father; Heart disease in her father; Hyperlipidemia in her father; Hypertension in her father; Stroke in her father. Allergies:  is allergic to lamictal [lamotrigine], maxalt  [rizatriptan ], morphine  and codeine , nicotine , wellbutrin  [bupropion ], zofran , chantix  [varenicline  tartrate], ciprofloxacin , codeine , haldol  [haloperidol ], ibuprofen, lidoderm  [lidocaine ], skelaxin [metaxalone], sulfa antibiotics, sulfasalazine, toradol  [ketorolac  tromethamine ], ambien [zolpidem], doxycycline , duloxetine, ingrezza [valbenazine tosylate], lyrica [pregabalin], methadone, neurontin  [gabapentin ], other, prednisone , amoxicillin, aripiprazole, chlorhexidine , penicillins, and tramadol .   Medication Reconciliation: Current Outpatient Medications on File Prior to Visit  Medication Sig   oxycodone -acetaminophen  (LYNOX) 5-300 MG tablet Take 1 tablet by mouth every 4 (four) hours as needed for  pain.   calcium carbonate (TUMS - DOSED IN MG ELEMENTAL CALCIUM) 500 MG chewable tablet Chew 1 tablet by mouth 4 (four) times daily as needed for indigestion or heartburn.   Cholecalciferol (VITAMIN D3 PO) Take 2 each by mouth daily. Gummy   cholestyramine  (QUESTRAN ) 4 g packet Take 1 packet (4 g total) by mouth 3 (three) times daily with meals.   EC-RX Testosterone 0.2 % CREA Place onto the skin.   escitalopram  (LEXAPRO ) 10 MG tablet Take 10 mg by mouth daily.   hyoscyamine  (LEVSIN  SL) 0.125 MG SL tablet Place 0.125 mg under the tongue every 4 (four) hours as needed for cramping.   LORazepam  (ATIVAN ) 1 MG tablet Take 1 mg by mouth every 8 (eight) hours as needed for anxiety.   lurasidone  (LATUDA )  40 MG TABS tablet Take 40 mg by mouth every evening.   methocarbamol  (ROBAXIN ) 500 MG tablet TAKE 1 TABLET BY MOUTH EVERY 8 HOURS AS NEEDED FOR UP TO 7 DAYS   promethazine  (PHENERGAN ) 25 MG tablet Take 25 mg by mouth every 6 (six) hours as needed for nausea or vomiting.   No current facility-administered medications on file prior to visit.   Medications Discontinued During This Encounter  Medication Reason   diazepam  (VALIUM ) 5 MG tablet      Physical Exam:    10/02/2024    9:28 PM 10/02/2024    8:00 PM 10/02/2024    6:34 PM  Vitals with BMI  Height 5' 7  5' 7  Weight 215 lbs  215 lbs  BMI 33.67  33.67  Systolic 132 119 869  Diastolic 112 79 85  Pulse 82 64 88  Vital signs reviewed.  Nursing notes reviewed. Weight trend reviewed. Physical Activity: Sufficiently Active (09/05/2024)   Exercise Vital Sign    Days of Exercise per Week: 5 days    Minutes of Exercise per Session: 30 min   General Appearance:  No acute distress appreciable.   Well-groomed, healthy-appearing female.  Well proportioned with no abnormal fat distribution.  Good muscle tone. Pulmonary:  Normal work of breathing at rest, no respiratory distress apparent.    Musculoskeletal: All extremities are intact.   Neurological:  Awake, alert, oriented, and engaged.  No obvious focal neurological deficits or cognitive impairments.  Sensorium seems unclouded.   Speech is clear and coherent with logical content. Psychiatric:  Appropriate mood, pleasant and cooperative demeanor, thoughtful and engaged during the exam     09/05/2024    8:22 AM 07/30/2024    3:15 PM 07/25/2024    4:08 PM 07/23/2024    2:05 PM  PHQ 2/9 Scores  PHQ - 2 Score 0 0    PHQ- 9 Score         Information is confidential and restricted. Go to Review Flowsheets to unlock data.   Admission on 10/02/2024, Discharged on 10/02/2024  Component Date Value Ref Range Status   Sodium 10/02/2024 135  135 - 145 mmol/L Final   Potassium 10/02/2024 4.1  3.5 - 5.1 mmol/L Final   Chloride 10/02/2024 101  98 - 111 mmol/L Final   CO2 10/02/2024 22  22 - 32 mmol/L Final   Glucose, Bld 10/02/2024 116 (H)  70 - 99 mg/dL Final   BUN 87/97/7974 15  6 - 20 mg/dL Final   Creatinine, Ser 10/02/2024 0.52  0.44 - 1.00 mg/dL Final   Calcium 87/97/7974 9.4  8.9 - 10.3 mg/dL Final   Total Protein 87/97/7974 6.6  6.5 - 8.1 g/dL Final   Albumin 87/97/7974 4.1  3.5 - 5.0 g/dL Final   AST 87/97/7974 17  15 - 41 U/L Final   ALT 10/02/2024 14  0 - 44 U/L Final   Alkaline Phosphatase 10/02/2024 133 (H)  38 - 126 U/L Final   Total Bilirubin 10/02/2024 0.3  0.0 - 1.2 mg/dL Final   GFR, Estimated 10/02/2024 >60  >60 mL/min Final   Anion gap 10/02/2024 12  5 - 15 Final   WBC 10/02/2024 8.6  4.0 - 10.5 K/uL Final   RBC 10/02/2024 4.73  3.87 - 5.11 MIL/uL Final   Hemoglobin 10/02/2024 14.9  12.0 - 15.0 g/dL Final   HCT 87/97/7974 43.1  36.0 - 46.0 % Final   MCV 10/02/2024 91.1  80.0 - 100.0 fL Final  MCH 10/02/2024 31.5  26.0 - 34.0 pg Final   MCHC 10/02/2024 34.6  30.0 - 36.0 g/dL Final   RDW 87/97/7974 12.3  11.5 - 15.5 % Final   Platelets 10/02/2024 271  150 - 400 K/uL Final   nRBC 10/02/2024 0.0  0.0 - 0.2 % Final  Office Visit on 07/30/2024  Component  Date Value Ref Range Status   SARS Coronavirus 2 Ag 07/30/2024 Negative  Negative Final  Lab on 07/27/2024  Component Date Value Ref Range Status   Salivary Cortisol Baseline 07/27/2024 0.392  ug/dL Final   Salivary Cortisol 2nd Specimen 07/27/2024 0.058  ug/dL Final   Salivary Cortisol 3rd Specimen 07/27/2024 CANCELED  ug/dL Final-Edited   Salivary Cortisol 4th Specimen 07/27/2024 0.161  ug/dL Final  Orders Only on 90/75/7974  Component Date Value Ref Range Status   Color, Urine 07/25/2024 YELLOW  YELLOW Final   APPearance 07/25/2024 CLEAR  CLEAR Final   Specific Gravity, Urine 07/25/2024 1.012  1.001 - 1.035 Final   pH 07/25/2024 7.5  5.0 - 8.0 Final   Glucose, UA 07/25/2024 NEGATIVE  NEGATIVE Final   Bilirubin Urine 07/25/2024 NEGATIVE  NEGATIVE Final   Ketones, ur 07/25/2024 NEGATIVE  NEGATIVE Final   Hgb urine dipstick 07/25/2024 NEGATIVE  NEGATIVE Final   Protein, ur 07/25/2024 NEGATIVE  NEGATIVE Final   Nitrites, Initial 07/25/2024 NEGATIVE  NEGATIVE Final   Leukocyte Esterase 07/25/2024 NEGATIVE  NEGATIVE Final   WBC, UA 07/25/2024 NONE SEEN  0 - 5 /HPF Final   RBC / HPF 07/25/2024 NONE SEEN  0 - 2 /HPF Final   Squamous Epithelial / HPF 07/25/2024 0-5  < OR = 5 /HPF Final   Bacteria, UA 07/25/2024 NONE SEEN  NONE SEEN /HPF Final   Hyaline Cast 07/25/2024 NONE SEEN  NONE SEEN /LPF Final   Note 07/25/2024    Final   Reflexve Urine Culture 07/25/2024    Final   Tracking House Account 07/25/2024    Final  Lab on 07/20/2024  Component Date Value Ref Range Status   TSH W/REFLEX TO FT4 07/20/2024 4.64 (H)  mIU/L Final   Cholesterol 07/20/2024 188  0 - 200 mg/dL Final   Triglycerides 90/80/7974 325.0 (H)  0.0 - 149.0 mg/dL Final   HDL 90/80/7974 34.80 (L)  >60.99 mg/dL Final   VLDL 90/80/7974 65.0 (H)  0.0 - 40.0 mg/dL Final   LDL Cholesterol 07/20/2024 88  0 - 99 mg/dL Final   Total CHOL/HDL Ratio 07/20/2024 5   Final   NonHDL 07/20/2024 153.18   Final   Hgb A1c MFr Bld  07/20/2024 5.5  4.6 - 6.5 % Final   Cortisol, Plasma 07/20/2024 4.8  ug/dL Final   Sodium 90/80/7974 137  135 - 145 mEq/L Final   Potassium 07/20/2024 4.2  3.5 - 5.1 mEq/L Final   Chloride 07/20/2024 105  96 - 112 mEq/L Final   CO2 07/20/2024 25  19 - 32 mEq/L Final   Glucose, Bld 07/20/2024 87  70 - 99 mg/dL Final   BUN 90/80/7974 10  6 - 23 mg/dL Final   Creatinine, Ser 07/20/2024 0.62  0.40 - 1.20 mg/dL Final   Total Bilirubin 07/20/2024 0.3  0.2 - 1.2 mg/dL Final   Alkaline Phosphatase 07/20/2024 120 (H)  39 - 117 U/L Final   AST 07/20/2024 12  0 - 37 U/L Final   ALT 07/20/2024 11  0 - 35 U/L Final   Total Protein 07/20/2024 6.3  6.0 - 8.3 g/dL Final  Albumin 07/20/2024 4.2  3.5 - 5.2 g/dL Final   GFR 90/80/7974 106.72  >60.00 mL/min Final   Calcium 07/20/2024 9.2  8.4 - 10.5 mg/dL Final   Sodium 90/80/7974 137  135 - 145 mEq/L Final   Potassium 07/20/2024 4.2  3.5 - 5.1 mEq/L Final   Chloride 07/20/2024 105  96 - 112 mEq/L Final   CO2 07/20/2024 25  19 - 32 mEq/L Final   Glucose, Bld 07/20/2024 87  70 - 99 mg/dL Final   BUN 90/80/7974 10  6 - 23 mg/dL Final   Creatinine, Ser 07/20/2024 0.62  0.40 - 1.20 mg/dL Final   GFR 90/80/7974 106.72  >60.00 mL/min Final   Calcium 07/20/2024 9.2  8.4 - 10.5 mg/dL Final   R793 ACTH  07/20/2024 7  6 - 50 pg/mL Final   5-HIAA, Plasma 07/20/2024 7.9  ng/mL Final   Color, Urine 07/20/2024 CANCELED   Final   Free T4 07/20/2024 0.9  0.8 - 1.8 ng/dL Final   Color, Urine 90/75/7974 YELLOW  YELLOW Final   APPearance 07/25/2024 CLEAR  CLEAR Final   Specific Gravity, Urine 07/25/2024 1.010  1.001 - 1.035 Final   pH 07/25/2024 6.5  5.0 - 8.0 Final   Glucose, UA 07/25/2024 NEGATIVE  NEGATIVE Final   Bilirubin Urine 07/25/2024 NEGATIVE  NEGATIVE Final   Ketones, ur 07/25/2024 NEGATIVE  NEGATIVE Final   Hgb urine dipstick 07/25/2024 NEGATIVE  NEGATIVE Final   Protein, ur 07/25/2024 NEGATIVE  NEGATIVE Final   Nitrites, Initial 07/25/2024 NEGATIVE   NEGATIVE Final   Leukocyte Esterase 07/25/2024 NEGATIVE  NEGATIVE Final   WBC, UA 07/25/2024 NONE SEEN  0 - 5 /HPF Final   RBC / HPF 07/25/2024 NONE SEEN  0 - 2 /HPF Final   Squamous Epithelial / HPF 07/25/2024 0-5  < OR = 5 /HPF Final   Bacteria, UA 07/25/2024 NONE SEEN  NONE SEEN /HPF Final   Hyaline Cast 07/25/2024 NONE SEEN  NONE SEEN /LPF Final   Note 07/25/2024    Final   24 Hour urine volume (VMAHVA) 07/25/2024 2,500  mL Final   Cortisol (Ur), Free 07/25/2024 27.5  4.0 - 50.0 mcg/24 h Final   CREATININE, URINE 07/25/2024 1.30  0.50 - 2.15 g/24 h Final   Reflexve Urine Culture 07/25/2024    Final  Admission on 07/12/2024, Discharged on 07/12/2024  Component Date Value Ref Range Status   Glucose-Capillary 07/12/2024 78  70 - 99 mg/dL Final   WBC 90/88/7974 8.2  4.0 - 10.5 K/uL Final   RBC 07/12/2024 4.48  3.87 - 5.11 MIL/uL Final   Hemoglobin 07/12/2024 13.9  12.0 - 15.0 g/dL Final   HCT 90/88/7974 42.2  36.0 - 46.0 % Final   MCV 07/12/2024 94.2  80.0 - 100.0 fL Final   MCH 07/12/2024 31.0  26.0 - 34.0 pg Final   MCHC 07/12/2024 32.9  30.0 - 36.0 g/dL Final   RDW 90/88/7974 11.9  11.5 - 15.5 % Final   Platelets 07/12/2024 267  150 - 400 K/uL Final   nRBC 07/12/2024 0.0  0.0 - 0.2 % Final   Neutrophils Relative % 07/12/2024 53  % Final   Neutro Abs 07/12/2024 4.4  1.7 - 7.7 K/uL Final   Lymphocytes Relative 07/12/2024 34  % Final   Lymphs Abs 07/12/2024 2.8  0.7 - 4.0 K/uL Final   Monocytes Relative 07/12/2024 10  % Final   Monocytes Absolute 07/12/2024 0.8  0.1 - 1.0 K/uL Final   Eosinophils Relative  07/12/2024 2  % Final   Eosinophils Absolute 07/12/2024 0.2  0.0 - 0.5 K/uL Final   Basophils Relative 07/12/2024 1  % Final   Basophils Absolute 07/12/2024 0.0  0.0 - 0.1 K/uL Final   Immature Granulocytes 07/12/2024 0  % Final   Abs Immature Granulocytes 07/12/2024 0.02  0.00 - 0.07 K/uL Final   Sodium 07/12/2024 139  135 - 145 mmol/L Final   Potassium 07/12/2024 3.9  3.5 -  5.1 mmol/L Final   Chloride 07/12/2024 105  98 - 111 mmol/L Final   CO2 07/12/2024 23  22 - 32 mmol/L Final   Glucose, Bld 07/12/2024 85  70 - 99 mg/dL Final   BUN 90/88/7974 9  6 - 20 mg/dL Final   Creatinine, Ser 07/12/2024 0.66  0.44 - 1.00 mg/dL Final   Calcium 90/88/7974 8.9  8.9 - 10.3 mg/dL Final   Total Protein 90/88/7974 6.2 (L)  6.5 - 8.1 g/dL Final   Albumin 90/88/7974 4.0  3.5 - 5.0 g/dL Final   AST 90/88/7974 17  15 - 41 U/L Final   ALT 07/12/2024 9  0 - 44 U/L Final   Alkaline Phosphatase 07/12/2024 120  38 - 126 U/L Final   Total Bilirubin 07/12/2024 <0.2  0.0 - 1.2 mg/dL Final   GFR, Estimated 07/12/2024 >60  >60 mL/min Final   Anion gap 07/12/2024 10  5 - 15 Final   Color, Urine 07/12/2024 YELLOW  YELLOW Final   APPearance 07/12/2024 CLEAR  CLEAR Final   Specific Gravity, Urine 07/12/2024 1.005  1.005 - 1.030 Final   pH 07/12/2024 7.0  5.0 - 8.0 Final   Glucose, UA 07/12/2024 NEGATIVE  NEGATIVE mg/dL Final   Hgb urine dipstick 07/12/2024 NEGATIVE  NEGATIVE Final   Bilirubin Urine 07/12/2024 NEGATIVE  NEGATIVE Final   Ketones, ur 07/12/2024 NEGATIVE  NEGATIVE mg/dL Final   Protein, ur 90/88/7974 NEGATIVE  NEGATIVE mg/dL Final   Nitrite 90/88/7974 NEGATIVE  NEGATIVE Final   Leukocytes,Ua 07/12/2024 NEGATIVE  NEGATIVE Final  Admission on 07/09/2024, Discharged on 07/10/2024  Component Date Value Ref Range Status   WBC 07/09/2024 9.5  4.0 - 10.5 K/uL Final   RBC 07/09/2024 4.55  3.87 - 5.11 MIL/uL Final   Hemoglobin 07/09/2024 14.4  12.0 - 15.0 g/dL Final   HCT 90/91/7974 41.3  36.0 - 46.0 % Final   MCV 07/09/2024 90.8  80.0 - 100.0 fL Final   MCH 07/09/2024 31.6  26.0 - 34.0 pg Final   MCHC 07/09/2024 34.9  30.0 - 36.0 g/dL Final   RDW 90/91/7974 11.9  11.5 - 15.5 % Final   Platelets 07/09/2024 291  150 - 400 K/uL Final   nRBC 07/09/2024 0.0  0.0 - 0.2 % Final   Sodium 07/09/2024 136  135 - 145 mmol/L Final   Potassium 07/09/2024 4.0  3.5 - 5.1 mmol/L Final    Chloride 07/09/2024 102  98 - 111 mmol/L Final   CO2 07/09/2024 21 (L)  22 - 32 mmol/L Final   Glucose, Bld 07/09/2024 91  70 - 99 mg/dL Final   BUN 90/91/7974 11  6 - 20 mg/dL Final   Creatinine, Ser 07/09/2024 0.71  0.44 - 1.00 mg/dL Final   Calcium 90/91/7974 9.8  8.9 - 10.3 mg/dL Final   GFR, Estimated 07/09/2024 >60  >60 mL/min Final   Anion gap 07/09/2024 14  5 - 15 Final  Appointment on 05/23/2024  Component Date Value Ref Range Status   Vitamin B-12 05/23/2024 574  211 - 911 pg/mL Final   Vit D, 25-Hydroxy 05/23/2024 36  30 - 100 ng/mL Final  Admission on 05/21/2024, Discharged on 05/22/2024  Component Date Value Ref Range Status   Lipase 05/21/2024 28  11 - 51 U/L Final   Sodium 05/21/2024 135  135 - 145 mmol/L Final   Potassium 05/21/2024 3.7  3.5 - 5.1 mmol/L Final   Chloride 05/21/2024 102  98 - 111 mmol/L Final   CO2 05/21/2024 23  22 - 32 mmol/L Final   Glucose, Bld 05/21/2024 96  70 - 99 mg/dL Final   BUN 92/78/7974 10  6 - 20 mg/dL Final   Creatinine, Ser 05/21/2024 0.69  0.44 - 1.00 mg/dL Final   Calcium 92/78/7974 9.5  8.9 - 10.3 mg/dL Final   Total Protein 92/78/7974 7.2  6.5 - 8.1 g/dL Final   Albumin 92/78/7974 4.1  3.5 - 5.0 g/dL Final   AST 92/78/7974 18  15 - 41 U/L Final   ALT 05/21/2024 17  0 - 44 U/L Final   Alkaline Phosphatase 05/21/2024 111  38 - 126 U/L Final   Total Bilirubin 05/21/2024 0.7  0.0 - 1.2 mg/dL Final   GFR, Estimated 05/21/2024 >60  >60 mL/min Final   Anion gap 05/21/2024 10  5 - 15 Final   WBC 05/21/2024 9.7  4.0 - 10.5 K/uL Final   RBC 05/21/2024 4.81  3.87 - 5.11 MIL/uL Final   Hemoglobin 05/21/2024 14.9  12.0 - 15.0 g/dL Final   HCT 92/78/7974 45.4  36.0 - 46.0 % Final   MCV 05/21/2024 94.4  80.0 - 100.0 fL Final   MCH 05/21/2024 31.0  26.0 - 34.0 pg Final   MCHC 05/21/2024 32.8  30.0 - 36.0 g/dL Final   RDW 92/78/7974 11.9  11.5 - 15.5 % Final   Platelets 05/21/2024 271  150 - 400 K/uL Final   nRBC 05/21/2024 0.0  0.0 - 0.2 %  Final   Color, Urine 05/22/2024 YELLOW  YELLOW Final   APPearance 05/22/2024 CLEAR  CLEAR Final   Specific Gravity, Urine 05/22/2024 1.028  1.005 - 1.030 Final   pH 05/22/2024 6.0  5.0 - 8.0 Final   Glucose, UA 05/22/2024 NEGATIVE  NEGATIVE mg/dL Final   Hgb urine dipstick 05/22/2024 NEGATIVE  NEGATIVE Final   Bilirubin Urine 05/22/2024 NEGATIVE  NEGATIVE Final   Ketones, ur 05/22/2024 NEGATIVE  NEGATIVE mg/dL Final   Protein, ur 92/77/7974 NEGATIVE  NEGATIVE mg/dL Final   Nitrite 92/77/7974 NEGATIVE  NEGATIVE Final   Leukocytes,Ua 05/22/2024 NEGATIVE  NEGATIVE Final  Admission on 05/16/2024, Discharged on 05/16/2024  Component Date Value Ref Range Status   Lipase 05/16/2024 20  11 - 51 U/L Final   Sodium 05/16/2024 136  135 - 145 mmol/L Final   Potassium 05/16/2024 4.5  3.5 - 5.1 mmol/L Final   Chloride 05/16/2024 102  98 - 111 mmol/L Final   CO2 05/16/2024 19 (L)  22 - 32 mmol/L Final   Glucose, Bld 05/16/2024 106 (H)  70 - 99 mg/dL Final   BUN 92/83/7974 9  6 - 20 mg/dL Final   Creatinine, Ser 05/16/2024 0.79  0.44 - 1.00 mg/dL Final   Calcium 92/83/7974 9.4  8.9 - 10.3 mg/dL Final   Total Protein 92/83/7974 7.1  6.5 - 8.1 g/dL Final   Albumin 92/83/7974 4.4  3.5 - 5.0 g/dL Final   AST 92/83/7974 25  15 - 41 U/L Final   ALT 05/16/2024 15  0 - 44 U/L Final  Alkaline Phosphatase 05/16/2024 129 (H)  38 - 126 U/L Final   Total Bilirubin 05/16/2024 0.4  0.0 - 1.2 mg/dL Final   GFR, Estimated 05/16/2024 >60  >60 mL/min Final   Anion gap 05/16/2024 14  5 - 15 Final   WBC 05/16/2024 8.4  4.0 - 10.5 K/uL Final   RBC 05/16/2024 4.70  3.87 - 5.11 MIL/uL Final   Hemoglobin 05/16/2024 15.1 (H)  12.0 - 15.0 g/dL Final   HCT 92/83/7974 43.6  36.0 - 46.0 % Final   MCV 05/16/2024 92.8  80.0 - 100.0 fL Final   MCH 05/16/2024 32.1  26.0 - 34.0 pg Final   MCHC 05/16/2024 34.6  30.0 - 36.0 g/dL Final   RDW 92/83/7974 12.1  11.5 - 15.5 % Final   Platelets 05/16/2024 278  150 - 400 K/uL Final    nRBC 05/16/2024 0.0  0.0 - 0.2 % Final  There may be more visits with results that are not included.  No image results found. XR KNEE 3 VIEW LEFT Result Date: 09/06/2024 AP, lateral, sunrise views of left knee reviewed.  No fracture or dislocation.  No significant degenerative changes noted of the left knee.  Prior inferior pole patellar fracture appears fully healed with no residual fracture line.  CT ANGIO HEAD NECK W WO CM Result Date: 07/09/2024 EXAM: CTA HEAD AND NECK WITHOUT AND WITH 07/09/2024 10:01:55 PM TECHNIQUE: CTA of the head and neck was performed with and without the administration of intravenous contrast. Multiplanar 2D and/or 3D reformatted images are provided for review. Automated exposure control, iterative reconstruction, and/or weight based adjustment of the mA/kV was utilized to reduce the radiation dose to as low as reasonably achievable. Stenosis of the internal carotid arteries measured using NASCET criteria. COMPARISON: MRI head 03/25/2024 and CTA head and neck 03/25/2024 CLINICAL HISTORY: Syncope/presyncope, cerebrovascular cause suspected. Patient reports feeling like their head is on a roller coaster ride. FINDINGS: CTA NECK: AORTIC ARCH AND ARCH VESSELS: No dissection or arterial injury. No significant stenosis of the brachiocephalic or subclavian arteries. CERVICAL CAROTID ARTERIES: No dissection, arterial injury, or hemodynamically significant stenosis by NASCET criteria. CERVICAL VERTEBRAL ARTERIES: No dissection, arterial injury, or significant stenosis. LUNGS AND MEDIASTINUM: Unremarkable. SOFT TISSUES: No acute abnormality. BONES: No acute abnormality. CTA HEAD: ANTERIOR CIRCULATION: No significant stenosis of the internal carotid arteries. No significant stenosis of the anterior cerebral arteries. No significant stenosis of the middle cerebral arteries. No aneurysm. POSTERIOR CIRCULATION: No significant stenosis of the posterior cerebral arteries. No significant stenosis of  the basilar artery. No significant stenosis of the vertebral arteries. No aneurysm. OTHER: No acute intracranial hemorrhage. Grey-white matter differentiation is maintained. No edema, mass effect, or midline shift. The basilar cisterns are patent. Ventricles are unremarkable. Orbits are symmetric. Calvarium is intact. Mastoid air cells are clear. Paranasal sinuses are clear. No dural venous sinus thrombosis on this non-dedicated study. IMPRESSION: 1. No acute intracranial findings. 2. No large vessel occlusion, hemodynamically significant stenosis, or aneurysm in the head or neck. Electronically signed by: Donnice Mania MD 07/09/2024 10:25 PM EDT RP Workstation: [id number]         ASSESSMENT & PLAN   Assessment & Plan Fibromyalgia Fibromyalgia with chronic pain syndrome   Severe pain flare is likely due to an emotional crisis rather than Wellbutrin . Stressors, including caring for her daughter in the hospital and family conflicts, have exacerbated the pain. Current management with muscle relaxers and Tylenol  is insufficient. Ketamine  therapy is discussed for its efficacy in severe  refractory depression and pain management. Transcranial magnetic stimulation (TMS) is also considered. Naltrexone is a potential option for long-term management to prevent future flares. A short course of oxycodone -acetaminophen  (Percocet) is prescribed for acute pain. She received a list of ketamine  therapy providers and discussed TMS as an alternative. Starting low-dose naltrexone after acute pain management is considered to prevent future flares.  We discussed possibly going on long term suboxone which would address my concerns about recurring nature of the pain with a safer long term opioid but she felt short course percocet followed by naltrexone and better emotional crisis support would be a better fit for her needs. .  She well understands risks of combining with benzodiazepine and risks of addiction. Cold  sore Prescription valtrex  given per patient request  Bipolar 1 disorder, mixed, moderate (HCC) Bipolar disorder and major depressive disorder   Current treatment includes high-dose Lexapro  and Latuda . Emotional distress and stress likely exacerbate symptoms. 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 TMS is also considered/discussed. Insurance coverage for ketamine  and TMS may require additional documentation. She received a list of ketamine  therapy providers and discussed TMS as an alternative. Advised on potential insurance challenges and the need for documentation of treatm   ORDER ASSOCIATIONS  #   DIAGNOSIS / CONDITION ICD-10 ENCOUNTER ORDER     ICD-10-CM   1. Fibromyalgia  M79.7 oxyCODONE -acetaminophen  (PERCOCET/ROXICET) 5-325 MG tablet    Naltrexone POWD    2. Cold sore  B00.1 valACYclovir  (VALTREX ) 500 MG tablet    3. Bipolar 1 disorder, mixed, moderate (HCC)  F31.62      Meds ordered this encounter  Medications   oxyCODONE -acetaminophen  (PERCOCET/ROXICET) 5-325 MG tablet    Sig: Take 1 tablet by mouth every 4 (four) hours as needed for up to 5 days for severe pain (pain score 7-10).    Dispense:  20 tablet    Refill:  0   Naltrexone POWD    Sig: 5 mg by Does not apply route daily at 6 (six) AM. Don't start until 10 days after completing percocet.    Dispense:  250 g    Refill:  0    Compound to 5 mg daily   valACYclovir  (VALTREX ) 500 MG tablet    Sig: Take 1 tablet (500 mg total) by mouth daily.    Dispense:  90 tablet    Refill:  4      This document was synthesized by artificial intelligence (Abridge) using HIPAA-compliant recording of the clinical interaction;   We discussed the use of AI scribe software for clinical note transcription with the patient, who gave verbal consent to proceed. additional Info: This encounter employed state-of-the-art, real-time, collaborative  documentation. The patient actively reviewed and assisted in updating their electronic medical record on a shared screen, ensuring transparency and facilitating joint problem-solving for the problem list, overview, and plan. This approach promotes accurate, informed care. The treatment plan was discussed and reviewed in detail, including medication safety, potential side effects, and all patient questions. We confirmed understanding and comfort with the plan. Follow-up instructions were established, including contacting the office for any concerns, returning if symptoms worsen, persist, or new symptoms develop, and precautions for potential emergency department visits.

## 2024-10-04 NOTE — Addendum Note (Signed)
 Addended by: Charmayne Odell G on: 10/04/2024 05:45 PM   Modules accepted: Level of Service

## 2024-10-04 NOTE — Patient Instructions (Addendum)
 It was a pleasure seeing you today! Your health and satisfaction are our top priorities.  [name], MD  VISIT SUMMARY: Today, we discussed your severe pain flare-up, which was likely triggered by recent emotional and physical stress. We also reviewed your ongoing mental health treatment and explored additional options for managing your fibromyalgia and depression.  YOUR PLAN: -FIBROMYALGIA WITH CHRONIC PAIN SYNDROME: Fibromyalgia is a condition characterized by widespread pain and tenderness in the muscles and joints. Your recent severe pain flare is likely due to the emotional and physical stress you've experienced. We discussed several new treatment options, including ketamine  therapy and transcranial magnetic stimulation (TMS), which may help manage your pain and depression. For immediate relief, we prescribed a short course of oxycodone -acetaminophen  (Percocet). We also considered starting you on low-dose naltrexone after your acute pain is managed to help prevent future flares.  -BIPOLAR DISORDER AND MAJOR DEPRESSIVE DISORDER: Bipolar disorder and major depressive disorder are mental health conditions that affect your mood and emotional state. Your current treatment includes Lexapro  and Latuda , but the recent stress has likely worsened your symptoms. We discussed ketamine  therapy and TMS as potential new treatments for your depression, which may also help reduce your reliance on other medications. Be aware that insurance coverage for these treatments may require additional documentation.  INSTRUCTIONS: Please follow up with the list of ketamine  therapy providers we discussed and consider the potential benefits of TMS. Continue taking your prescribed medications and use the short course of Percocet as directed for acute pain relief. We will consider starting low-dose naltrexone after your acute pain is managed. If you have any questions or concerns, please do not hesitate to contact our  office.  Your Providers PCP: [name], MD,  [phone]) Referring Provider: [name], MD,  [phone]) Care Team Provider: Jess Roby Masker, MD Care Team Provider: Shirly Carlin LITTIE DEVONNA,  [phone]) Care Team Provider: Dale Cough, MD,  [phone]) Care Team Provider: Mat Browning, MD,  [phone])  NEXT STEPS: [x]  Early Intervention: Schedule sooner appointment, call our on-call services, or go to emergency room if there is any significant Increase in pain or discomfort New or worsening symptoms Sudden or severe changes in your health [x]  Flexible Follow-Up: We recommend a Return in about 1 month (around 11/04/2024). for optimal routine care. This allows for progress monitoring and treatment adjustments. [x]  Preventive Care: Schedule your annual preventive care visit! It's typically covered by insurance and helps identify potential health issues early. [x]  Lab & X-ray Appointments: Incomplete tests scheduled today, or call to schedule. X-rays: [facility] (M-F, 8:30am-noon or 1pm-5pm). [x]  Medical Information Release: Sign a release form at front desk to obtain relevant medical information we don't have.  MAKING THE MOST OF OUR FOCUSED 20 MINUTE APPOINTMENTS: [x]   Clearly state your top concerns at the beginning of the visit to focus our discussion [x]   If you anticipate you will need more time, please inform the front desk during scheduling - we can book multiple appointments in the same week. [x]   If you have transportation problems- use our convenient video appointments or ask about transportation support. [x]   We can get down to business faster if you use MyChart to update information before the visit and submit non-urgent questions before your visit. Thank you for taking the time to provide details through MyChart.  Let our nurse know and she can import this information into your encounter documents.  Arrival and Wait  Times: [x]   Arriving  on time ensures that everyone receives prompt attention. [x]   Early morning (8a) and afternoon (1p) appointments tend to have shortest wait times. [x]   Unfortunately, we cannot delay appointments for late arrivals or hold slots during phone calls.  Getting Answers and Following Up [x]   Simple Questions & Concerns: For quick questions or basic follow-up after your visit, reach us  at [phone] or MyChart messaging. [x]   Complex Concerns: If your concern is more complex, scheduling an appointment might be best. Discuss this with the staff to find the most suitable option. [x]   Lab & Imaging Results: We'll contact you directly if results are abnormal or you don't use MyChart. Most normal results will be on MyChart within 2-3 business days, with a review message from Dr. Jesus. Haven't heard back in 2 weeks? Need results sooner? Contact us  at [phone]. [x]   Referrals: Our referral coordinator will manage specialist referrals. The specialist's office should contact you within 2 weeks to schedule an appointment. Call us  if you haven't heard from them after 2 weeks.  Staying Connected [x]   MyChart: Activate your MyChart for the fastest way to access results and message us . See the last page of this paperwork for instructions on how to activate.  Bring to Your Next Appointment [x]   Medications: Please bring all your medication bottles to your next appointment to ensure we have an accurate record of your prescriptions. [x]   Health Diaries: If you're monitoring any health conditions at home, keeping a diary of your readings can be very helpful for discussions at your next appointment.  Billing [x]   X-ray & Lab Orders: These are billed by separate companies. Contact the invoicing company directly for questions or concerns. [x]   Visit Charges: Discuss any billing inquiries with our administrative services team.  Your Satisfaction Matters [x]   Share Your Experience: We  strive for your satisfaction! If you have any complaints, or preferably compliments, please let Dr. Jesus know directly or contact our Practice Administrators, Manuelita Rubin or [employer], by asking at the front desk.   Reviewing Your Records [x]   Review this early draft of your clinical encounter notes below and the final encounter summary tomorrow on MyChart after its been completed.  All orders placed so far are visible here: Fibromyalgia -     oxyCODONE -Acetaminophen ; Take 1 tablet by mouth every 4 (four) hours as needed for up to 5 days for severe pain (pain score 7-10).  Dispense: 20 tablet; Refill: 0 -     Naltrexone; 5 mg by Does not apply route daily at 6 (six) AM. Don't start until 10 days after completing percocet.  Dispense: 250 g; Refill: 0  Cold sore -     valACYclovir  HCl; Take 1 tablet (500 mg total) by mouth daily.  Dispense: 90 tablet; Refill: 4  Bipolar 1 disorder, mixed, moderate (HCC)       Here are a few clinics in/near [place], [place] that offer ketamine - or esketamine-based therapies, along with their phone numbers: Clinic / Provider Phone Number  [facility]  [facility] [phone] [place]+1  [facility] - Ketamine  Clinic [phone] [email or web address]  [facility] [place] [phone] [email or web address]  If you like -- I can expand this list to 10-15 ketamine /mental-health clinics within 50 miles of [place] (addresses + phone + link). Would you like me to build that expanded list for you now? You said: expand it ChatGPT said: Here's an expanded list of clinics/centers in or near [place], [place] that offer  ketamine - or esketamine-based treatment (or advertise themselves as ketamine  / "Spravato" / infusion / TMS/mental-health clinics). I include address (or approximate location) + phone number to help you call and verify availability. Clinic / Provider Approximate Address / Notes Phone Number   [facility]  [facility] [address], [place], [place] [address] [facility]  [facility] +[phone] MapQuest+1  [facility] [address], [place], [place] [address] MapQuest+1 [phone] [facility] [address], [place], [place] [address] [employer] Care+1 [phone] [facility]  ?? Brief notes / what each offers [facility] -- offers ketamine  infusion therapy (and possibly esketamine/"Spravato"-type treatments) for mood disorders, chronic pain, and related conditions. [facility] +2[facility]2 [facility] -- offers nasal-spray esketamine (Spravato) and [employer] therapy (non-drug alternative) for depression / treatment-resistant depression. [employer] TMS+2[facility]+2 [facility] -- lists a "Ketamine  Clinic" at their [facility] office; they mention ketamine -based treatments among their services. [facility] If you like -- I can also pull up reviews, approximate wait-times, and whether they accept insurance (for each clinic) -- sometimes that can make a big difference when you call. Do you want me to build that full expanded table now?       ?? YOUR LIFELINE: [facility]   ?? IMMEDIATE HELP AVAILABLE - NO APPOINTMENT NEEDED ?? Address: [address] [place], [place] [address] ?? Direct Line: [phone] ?? Hours: Open 24/7 - Every Day of the Year ?? Cost: Accepts Most Insurance Plans Including Medicaid    When Should You Go to the [facility]?  YOU SHOULD GO IF YOU'RE EXPERIENCING:  ?? Depression symptoms - Can't sleep or sleeping too much, no appetite or eating too much, feelings of worthlessness ?? Overwhelming anxiety that's interfering with your  daily life ?? Substance use concerns - drinking or drug use that's gotten out of control ?? Troubling thoughts - thoughts of hurting yourself or others ?? Mood swings - extreme highs and lows that are disrupting your relationships ?? Confusion or disorientation - feeling disconnected from reality ?? Crisis situations - when you feel like you can't cope and need immediate support Why Patients Love This Center:     ?? A Better Care in Mind - This is Their Motto and Promise This is  's first dedicated behavioral health urgent care facility - specially designed for mental health and substance use crises, not medical emergencies.   ?? What You Get ?? Why This Matters for You  No Wait Lists Walk in any time, day or night - you'll be seen promptly when you're in crisis  Specialized Environment Calming, therapeutic space designed specifically for mental health - not a scary emergency room  Expert Team Mental health clinicians and medical providers who understand exactly what you're going through  Comprehensive Care Assessment, crisis stabilization, safety planning, and connection to ongoing support  Natural Light & Comfort Large windows, comfortable spaces, and a healing courtyard - feels more like a wellness center  Complete Services Under One Roof: ?? Assessment Thorough mental health evaluation with immediate safety planning  ?? Crisis Stabilization 2-24 hours of intensive support until you're feeling safer and more stable  ??? Short-term Admission 16-bed crisis center for 2-5 day stays when you need more intensive support  ?? Medication Support On-site pharmacy and medication management by psychiatrists  ?? Peer  Counseling Connect with others who understand your experience firsthand  ?? Care Coordination Seamless connection to ongoing therapy, psychiatry, and community resources  ? HOW LONG WILL YOU BE THERE?  Assessment: About 1 hour  Urgent Care: 2-24 hours (until you're  stable)  Crisis Center Stay: 2-5 days if you need intensive support  What to Expect When You Arrive: ?? Welcoming Reception: Friendly staff who understand you're having a difficult time ?? Quick Check-in: Brief registration process - they'll handle insurance details ?? Medical Screening: Quick check to make sure you're medically stable ?? Mental Health Assessment: Private conversation with a trained clinician about what's happening ?? Safety Planning: Working together to create a plan to keep you safe ?? Next Steps: Connection to ongoing care and resources in the community ???????? FAMILY SUPPORT INCLUDED: Your loved ones can be involved in your care. The team encourages family participation and will help coordinate with those supporting you at home.    ?? AGES SERVED: Children (ages 50-17) and Adults - specialized care for each age group with age-appropriate environments and treatment approaches.  ?? Getting There: Address: [address] [place], [place] [address] Parking: Free parking Conserv[employer], [employer]/gallery Transit: Accessible via [employer] bus routes Mobile Crisis: If you can't get there, call [phone] for mobile crisis team ?? Insurance & Payment: Insurance Accepted What This Means for You  [place] Medicaid (All Plans) Full coverage - no cost to you  [employer] Covered like any urgent care visit  Self-Pay Options Financial assistance available - don't let cost stop you from getting help   ?? WHEN TO CALL 911 INSTEAD: You're having a medical emergency (chest pain, difficulty breathing, severe injury) You've overdosed on drugs or alcohol You're actively trying to harm yourself or someone else ?? FOR IMMEDIATE CRISIS SUPPORT: 988: National Suicide & Crisis Lifeline (call or text) 813 702 1599: Mobile Crisis Team (they come to you)         ?? What Patients Say: Instead of sitting in an ER for hours feeling judged, I was seen immediately by people who understood exactly what I  was going through. They helped me create a safety plan and connected me with a therapist the same week. The environment was so calming - nothing like what I expected. It felt more like a wellness center than a hospital.  ?? TRUSTED PARTNERSHIP: This facility is a collaboration between [employer] and [employer] - bringing together the best of public health commitment and actor.  ?? Special Programs & Resources: ???? Fully Accessible: ADA compliant for all physical abilities ?? Multilingual Support: Interpreters available for non-English speakers ????? LGBTQ+ Affirming: Safe, respectful care for all identities ??? Veteran-Friendly: Staff trained in eli lilly and company culture and trauma ?? Trauma-Informed Care: Understanding of how trauma affects healing ??? EASY TO FIND   From [address] Take [address] ([address]), turn left on [address]  From [employer] on [address], turn right on [address]  Landmarks Near [facility]   ?? YOU DON'T HAVE TO SUFFER ALONE This facility exists because your mental health matters. The community invested $20 million to make sure you have a place to go when you're struggling. You deserve help, you deserve to feel better, and you deserve to be treated with dignity and respect. ?? Call [phone] or just walk in - someone is waiting to help you 24/7.    ?? QUICK REFERENCE: Address: [address] [place], [place] [address] Phone: [phone]  Hours: 24/7/365 - Always Open Cost: Covered by most insurance, financial assistance available No Appointment Needed - Just come when you need help

## 2024-10-04 NOTE — Addendum Note (Signed)
 Addended by: Mishael Krysiak G on: 10/04/2024 07:50 PM   Modules accepted: Orders

## 2024-10-04 NOTE — Assessment & Plan Note (Signed)
 Fibromyalgia with chronic pain syndrome   Severe pain flare is likely due to an emotional crisis rather than Wellbutrin . Stressors, including caring for her daughter in the hospital and family conflicts, have exacerbated the pain. Current management with muscle relaxers and Tylenol  is insufficient. Ketamine  therapy is discussed for its efficacy in severe refractory depression and pain management. Transcranial magnetic stimulation (TMS) is also considered. Naltrexone is a potential option for long-term management to prevent future flares. A short course of oxycodone -acetaminophen  (Percocet) is prescribed for acute pain. She received a list of ketamine  therapy providers and discussed TMS as an alternative. Starting low-dose naltrexone after acute pain management is considered to prevent future flares.  We discussed possibly going on long term suboxone which would address my concerns about recurring nature of the pain with a safer long term opioid but she felt short course percocet followed by naltrexone and better emotional crisis support would be a better fit for her needs. .  She well understands risks of combining with benzodiazepine and risks of addiction.

## 2024-10-09 ENCOUNTER — Inpatient Hospital Stay: Admitting: Internal Medicine

## 2024-11-06 ENCOUNTER — Encounter (HOSPITAL_COMMUNITY): Payer: Self-pay | Admitting: Orthopedic Surgery

## 2024-11-27 ENCOUNTER — Emergency Department (HOSPITAL_COMMUNITY)
Admission: EM | Admit: 2024-11-27 | Discharge: 2024-11-27 | Disposition: A | Discharge status: Home / Self Care | Attending: Emergency Medicine | Admitting: Emergency Medicine

## 2024-11-27 ENCOUNTER — Emergency Department (HOSPITAL_COMMUNITY)

## 2024-11-27 ENCOUNTER — Encounter (HOSPITAL_COMMUNITY): Payer: Self-pay | Admitting: Emergency Medicine

## 2024-11-27 ENCOUNTER — Other Ambulatory Visit: Payer: Self-pay

## 2024-11-27 DIAGNOSIS — R1084 Generalized abdominal pain: Secondary | ICD-10-CM | POA: Diagnosis not present

## 2024-11-27 DIAGNOSIS — R1031 Right lower quadrant pain: Secondary | ICD-10-CM | POA: Diagnosis present

## 2024-11-27 LAB — CBC
HCT: 44.5 % (ref 36.0–46.0)
Hemoglobin: 15 g/dL (ref 12.0–15.0)
MCH: 31.1 pg (ref 26.0–34.0)
MCHC: 33.7 g/dL (ref 30.0–36.0)
MCV: 92.1 fL (ref 80.0–100.0)
Platelets: 255 10*3/uL (ref 150–400)
RBC: 4.83 MIL/uL (ref 3.87–5.11)
RDW: 12.2 % (ref 11.5–15.5)
WBC: 8.2 10*3/uL (ref 4.0–10.5)
nRBC: 0 % (ref 0.0–0.2)

## 2024-11-27 LAB — COMPREHENSIVE METABOLIC PANEL WITH GFR
ALT: 16 U/L (ref 0–44)
AST: 20 U/L (ref 15–41)
Albumin: 4.3 g/dL (ref 3.5–5.0)
Alkaline Phosphatase: 136 U/L — ABNORMAL HIGH (ref 38–126)
Anion gap: 11 (ref 5–15)
BUN: 13 mg/dL (ref 6–20)
CO2: 23 mmol/L (ref 22–32)
Calcium: 9.1 mg/dL (ref 8.9–10.3)
Chloride: 102 mmol/L (ref 98–111)
Creatinine, Ser: 0.72 mg/dL (ref 0.44–1.00)
GFR, Estimated: >60 mL/min
Glucose, Bld: 108 mg/dL — ABNORMAL HIGH (ref 70–99)
Potassium: 4.4 mmol/L (ref 3.5–5.1)
Sodium: 137 mmol/L (ref 135–145)
Total Bilirubin: 0.3 mg/dL (ref 0.0–1.2)
Total Protein: 7 g/dL (ref 6.5–8.1)

## 2024-11-27 LAB — URINALYSIS, ROUTINE W REFLEX MICROSCOPIC
Bilirubin Urine: NEGATIVE
Glucose, UA: NEGATIVE mg/dL
Hgb urine dipstick: NEGATIVE
Ketones, ur: NEGATIVE mg/dL
Leukocytes,Ua: NEGATIVE
Nitrite: NEGATIVE
Protein, ur: NEGATIVE mg/dL
pH: 5 (ref 5.0–8.0)

## 2024-11-27 LAB — LIPASE, BLOOD: Lipase: 27 U/L (ref 11–51)

## 2024-11-27 MED ORDER — LORAZEPAM 1 MG PO TABS
1.0000 mg | ORAL_TABLET | Freq: Once | ORAL | Status: AC
  Administered 2024-11-27: 1 mg via ORAL
  Filled 2024-11-27: qty 1

## 2024-11-27 MED ORDER — HYDROMORPHONE HCL 1 MG/ML IJ SOLN
0.5000 mg | Freq: Once | INTRAMUSCULAR | Status: AC
  Administered 2024-11-27: 0.5 mg via INTRAVENOUS
  Filled 2024-11-27: qty 1

## 2024-11-27 MED ORDER — IOHEXOL 300 MG/ML  SOLN
100.0000 mL | Freq: Once | INTRAMUSCULAR | Status: AC | PRN
  Administered 2024-11-27: 100 mL via INTRAVENOUS

## 2024-11-27 MED ORDER — METOCLOPRAMIDE HCL 5 MG/ML IJ SOLN
10.0000 mg | Freq: Once | INTRAMUSCULAR | Status: AC
  Administered 2024-11-27: 10 mg via INTRAVENOUS
  Filled 2024-11-27: qty 2

## 2024-11-27 NOTE — ED Notes (Addendum)
 SABRA

## 2024-11-27 NOTE — ED Provider Triage Note (Signed)
 Emergency Medicine Provider Triage Evaluation Note  Gina [name] , a 49 y.o. female  was evaluated in triage.  Pt complains of RLQ abdominal pain acutely onset this afternoon and progressively worse, Hx of multiple abdominal surgeries. Endorses nausea, abdominal bloating. Denies fever, vomiting, diarrhea, melena, hematochezia, dysuria, vaginal discharge, vaginal bleeding.   Review of Systems  Positive: N/a Negative: N/a  Physical Exam  BP (!) 129/99   Pulse 89   Temp 98 F (36.7 C)   Resp 16   LMP 01/01/2004   SpO2 100%  Gen:   Acute pain and in distress, tearful.  Resp:  Normal effort  MSK:   Moves extremities without difficulty  Other:    Medical Decision Making  Medically screening exam initiated at 5:38 PM.  Appropriate orders placed.  Gina [name] was informed that the remainder of the evaluation will be completed by another provider, this initial triage assessment does not replace that evaluation, and the importance of remaining in the ED until their evaluation is complete.     Beola Terrall RAMAN, [place] 11/27/24 1740

## 2024-11-27 NOTE — ED Triage Notes (Signed)
 Patient c/o RLQ abdominal pain x 5 hours. Patient report taking OTC for pain without relief. Patient report nausea denies vomiting and diarrhea. Patient denies dysuria and fever. Patient denies chest pain and SOB.

## 2024-11-27 NOTE — ED Provider Notes (Signed)
 " [place] EMERGENCY DEPARTMENT AT [facility] Provider Note   CSN: [id number] Arrival date & time: 11/27/24  1700     Patient presents with: Abdominal Pain   Gina [name] is a 49 y.o. female.   HPI Patient reports that she was doing some physical therapy on her neck and she started to feel like she had some abdominal discomfort like she had to have a bowel movement.  She reports that she went home and did go on to have some gas and a bowel movement but pain got worse.  She reports she then got some severe lower abdominal pain and bloating.  She tried Tums, acetaminophen , peanut butter crackers and soda.  Nothing gave her relief.  That point she came to the emergency department.    Prior to Admission medications  Medication Sig Start Date End Date Taking? Authorizing Provider  calcium carbonate (TUMS - DOSED IN MG ELEMENTAL CALCIUM) 500 MG chewable tablet Chew 1 tablet by mouth 4 (four) times daily as needed for indigestion or heartburn.    [provider]  Cholecalciferol (VITAMIN D3 PO) Take 2 each by mouth daily. Gummy    [provider]  cholestyramine  (QUESTRAN ) 4 g packet Take 1 packet (4 g total) by mouth 3 (three) times daily with meals. 07/18/24   [name], MD  EC-RX Testosterone 0.2 % CREA Place onto the skin.    [provider]  escitalopram  (LEXAPRO ) 10 MG tablet Take 10 mg by mouth daily. 07/10/24   [provider]  hyoscyamine  (LEVSIN  SL) 0.125 MG SL tablet Place 0.125 mg under the tongue every 4 (four) hours as needed for cramping.    [provider]  LORazepam  (ATIVAN ) 1 MG tablet Take 1 mg by mouth every 8 (eight) hours as needed for anxiety.    [provider]  lurasidone  (LATUDA ) 40 MG TABS tablet Take 40 mg by mouth every evening. 12/16/22   [provider]  methocarbamol  (ROBAXIN ) 500 MG tablet TAKE 1 TABLET BY MOUTH EVERY 8 HOURS AS NEEDED FOR UP TO 7 DAYS 05/17/24   [name], NP  Naltrexone  POWD Take 5 mg by mouth daily at 6 (six) AM. Don't start until 10 days after completing percocet. Reduces fibromyalgia pain flares long term, but blocks opioid effects. 10/04/24   [name], MD  oxycodone -acetaminophen  (LYNOX) 5-300 MG tablet Take 1 tablet by mouth every 4 (four) hours as needed for pain.    [provider]  promethazine  (PHENERGAN ) 25 MG tablet Take 25 mg by mouth every 6 (six) hours as needed for nausea or vomiting.    [provider]  valACYclovir  (VALTREX ) 500 MG tablet Take 1 tablet (500 mg total) by mouth daily. 10/04/24   [name], MD    Allergies: Lamictal [lamotrigine], Maxalt  [rizatriptan ], Morphine  and codeine , Nicotine , Wellbutrin  [bupropion ], Zofran , Chantix  [varenicline  tartrate], Ciprofloxacin , Codeine , Haldol  [haloperidol ], Ibuprofen, Lidoderm  [lidocaine ], Skelaxin [metaxalone], Sulfa antibiotics, Sulfasalazine, Toradol  [ketorolac  tromethamine ], Ambien [zolpidem], Doxycycline , Duloxetine, Ingrezza [valbenazine tosylate], Lyrica [pregabalin], Methadone, Neurontin  [gabapentin ], Other, Prednisone , Amoxicillin, Aripiprazole, Chlorhexidine , Penicillins, and Tramadol     Review of Systems  Updated Vital Signs BP 107/63 (BP Location: Right Arm)   Pulse 67   Temp 97.7 F (36.5 C) (Oral)   Resp 18   LMP 01/01/2004   SpO2 99%   Physical Exam Constitutional:      Comments: Alert nontoxic no acute distress  HENT:     Mouth/Throat:  Pharynx: Oropharynx is clear.  Cardiovascular:     Rate and Rhythm: Normal rate and regular rhythm.  Pulmonary:     Effort: Pulmonary effort is normal.     Breath sounds: Normal breath sounds.  Abdominal:     Comments: Abdomen is soft.  Patient endorses some lower abdominal discomfort to palpation but no guarding  Musculoskeletal:        General: Normal range of motion.     Right lower leg: No edema.     Left lower leg: No edema.  Skin:    General: Skin is warm and dry.   Neurological:     General: No focal deficit present.     Mental Status: She is oriented to person, place, and time.     Motor: No weakness.     (all labs ordered are listed, but only abnormal results are displayed) Labs Reviewed  COMPREHENSIVE METABOLIC PANEL WITH GFR - Abnormal; Notable for the following components:      Result Value   Glucose, Bld 108 (*)    Alkaline Phosphatase 136 (*)    All other components within normal limits  LIPASE, BLOOD  CBC  URINALYSIS, ROUTINE W REFLEX MICROSCOPIC    EKG: None  Radiology: CT ABDOMEN PELVIS W CONTRAST Result Date: 11/27/2024 EXAM: CT ABDOMEN AND PELVIS WITH CONTRAST 11/27/2024 07:20:00 PM TECHNIQUE: CT of the abdomen and pelvis was performed with the administration of 100 mL of iohexol  (OMNIPAQUE ) 300 MG/ML solution. Multiplanar reformatted images are provided for review. Automated exposure control, iterative reconstruction, and/or weight-based adjustment of the mA/kV was utilized to reduce the radiation dose to as low as reasonably achievable. COMPARISON: None available. CLINICAL HISTORY: Right lower quadrant pain. FINDINGS: LOWER CHEST: No acute abnormality. LIVER: Mild fatty infiltration of the liver is again identified. GALLBLADDER AND BILE DUCTS: The gallbladder has been surgically removed. No biliary ductal dilatation. SPLEEN: No acute abnormality. PANCREAS: No acute abnormality. ADRENAL GLANDS: No acute abnormality. KIDNEYS, URETERS AND BLADDER: No renal calculi or obstructive changes are noted. A simple cyst is noted within the right kidney, stable from the prior exam. Per consensus, no follow-up is needed for simple Bosniak type 1 and 2 renal cysts, unless the patient has a malignancy history or risk factors. The bladder is partially distended. GI AND BOWEL: Stomach demonstrates no acute abnormality. The small bowel is unremarkable. No obstructive or inflammatory changes of the colon are noted. The appendix is within normal limits.  There is no bowel obstruction. PERITONEUM AND RETROPERITONEUM: No free fluid is noted. No free air. VASCULATURE: Aorta is normal in caliber. LYMPH NODES: No lymphadenopathy. REPRODUCTIVE ORGANS: The uterus has been surgically removed. BONES AND SOFT TISSUES: No bony abnormalities are seen. No focal soft tissue abnormality. IMPRESSION: 1. No acute findings in the abdomen or pelvis to explain right lower quadrant pain. Electronically signed by: Oneil Devonshire MD 11/27/2024 07:53 PM EST RP Workstation: [id number]     Procedures   Medications Ordered in the ED  HYDROmorphone  (DILAUDID ) injection 0.5 mg (0.5 mg Intravenous Given 11/27/24 1814)  metoCLOPramide  (REGLAN ) injection 10 mg (10 mg Intravenous Given 11/27/24 1814)  LORazepam  (ATIVAN ) tablet 1 mg (1 mg Oral Given 11/27/24 1855)  iohexol  (OMNIPAQUE ) 300 MG/ML solution 100 mL (100 mLs Intravenous Contrast Given 11/27/24 1913)                                    Medical Decision Making Amount  and/or Complexity of Data Reviewed Labs: ordered.  Risk Prescription drug management.   Patient presents dolling.  She reports pain got pretty bad quite quickly.  Patient does have history of chronic pain and does take narcotic pain control intermittently.  She does report that she went and had a bowel movement but pain was not relieved.  At this time with sudden severe pain different from baseline will proceed with CT abdomen pelvis.  GFR greater than 60 metabolic panel normal except alk phos 136 lipase 27 CBC normal  CT abdomen pelvis interpreted by radiology no acute findings.  By the time my assessment, patient was feeling improved.  After CT abdomen pelvis, patient is asking for something to eat.  At this time I feel she is stable for continued home management.     Final diagnoses:  Generalized abdominal pain    ED Discharge Orders     None          Armenta Canning, MD 11/27/24 2103  "

## 2024-11-28 ENCOUNTER — Telehealth: Payer: Self-pay

## 2024-11-28 NOTE — Telephone Encounter (Signed)
 Transition Care Management Follow-up Telephone Call Date of discharge and from where: 11/27/2024 How have you been since you were released from the hospital? No Any questions or concerns? No  Items Reviewed: Did the pt receive and understand the discharge instructions provided? Yes  Medications obtained and verified? Yes  Other?   Any new allergies since your discharge? No  Dietary orders reviewed? Yes Do you have support at home? Yes   Home Care and Equipment/Supplies: Were home health services ordered? NA If so, what is the name of the agency?  Has the agency set up a time to come to the patient's home? not applicable Were any new equipment or medical supplies ordered?  No What is the name of the medical supply agency?  Were you able to get the supplies/equipment? not applicable Do you have any questions related to the use of the equipment or supplies? No  Functional Questionnaire: (I = Independent and D = Dependent) ADLs: I  Bathing/Dressing- I  Meal Prep- I  Eating- I  Maintaining continence- I  Transferring/Ambulation- I  Managing Meds- I  Follow up appointments reviewed:  PCP Hospital f/u appt confirmed? Yes  Scheduled to see Dr. Jesus on [phone] [facility]? No   Are transportation arrangements needed? No  If their condition worsens, is the pt aware to call PCP or go to the Emergency Dept.? Yes Was the patient provided with contact information for the PCP's office or ED? Yes Was to pt encouraged to call back with questions or concerns? Yes

## 2024-12-05 ENCOUNTER — Other Ambulatory Visit: Payer: Self-pay | Admitting: Internal Medicine

## 2024-12-05 ENCOUNTER — Ambulatory Visit: Admitting: Internal Medicine

## 2024-12-05 ENCOUNTER — Encounter: Payer: Self-pay | Admitting: Internal Medicine

## 2024-12-05 VITALS — BP 110/72 | HR 82 | Temp 98.0°F | Ht 67.0 in | Wt 219.0 lb

## 2024-12-05 DIAGNOSIS — R1084 Generalized abdominal pain: Secondary | ICD-10-CM

## 2024-12-05 DIAGNOSIS — F32A Depression, unspecified: Secondary | ICD-10-CM

## 2024-12-05 DIAGNOSIS — G43711 Chronic migraine without aura, intractable, with status migrainosus: Secondary | ICD-10-CM

## 2024-12-05 DIAGNOSIS — K660 Peritoneal adhesions (postprocedural) (postinfection): Secondary | ICD-10-CM

## 2024-12-05 DIAGNOSIS — G44209 Tension-type headache, unspecified, not intractable: Secondary | ICD-10-CM

## 2024-12-05 MED ORDER — NURTEC 75 MG PO TBDP: 1.0000 | ORAL_TABLET | Freq: Every day | ORAL | 2 refills | Status: AC

## 2024-12-05 MED ORDER — TIZANIDINE HCL 4 MG PO TABS: 4.0000 mg | ORAL_TABLET | Freq: Four times a day (QID) | ORAL | 0 refills | Status: AC | PRN

## 2024-12-05 MED ORDER — OXYCODONE-ACETAMINOPHEN 5-300 MG PO TABS: 1.0000 | ORAL_TABLET | ORAL | 0 refills | Status: AC | PRN

## 2024-12-05 NOTE — Progress Notes (Signed)
 ==============================  [facility]: [phone]   -- Medical Office Visit --  Patient: Gina [name]      Age: 49 y.o.       Sex:  female  Date:   12/05/2024 Today's Healthcare Provider: Bernardino KANDICE Cone, MD  ==============================   Chief Complaint: Abdominal Pain (Was seen in ed they did ct scan there  hospital )  Discussed the use of AI scribe software for clinical note transcription with the patient, who gave verbal consent to proceed.  History of Present Illness 49 year old female with a history of abdominal surgeries and endometriosis who presents with abdominal pain and headaches.  She has been experiencing abdominal pain since last Tuesday, which began during a physical therapy session. Initially localized to the right lower quadrant, the pain has since become more diffuse. A CT scan at the hospital showed a stable simple cyst in the right kidney, with no signs of appendicitis or bowel obstruction. Given her history of multiple abdominal surgeries, including adhesion removal, she worries about the possibility of pain returning due to adhesions. She has had a bowel obstruction in the past two years.  She is undergoing transcranial magnetic stimulation (TMS) therapy, which has been beneficial for her nightmares and depression. However, she experiences severe headaches following the sessions, particularly on the right side of her head, which are not relieved by Tylenol  or Tylenol  3. The headaches are intense, with difficulty opening her right eye, and are accompanied by neck pain. She has tried methocarbamol  without relief and is apprehensive about trying new medications due to a history of adverse reactions.  Her past medical history includes a partial hysterectomy at age 68 due to endometriosis and endomiosis, multiple abdominal surgeries for hernias and adhesions, and a history of sciatica that required extensive  physical therapy. She has recently quit smoking and is actively participating in physical therapy and Weight [employer]. She reports being healthier than in previous years, despite recent challenges.  She has a history of migraines but notes that the current headaches differ from her typical migraines, as they are not accompanied by nausea. She has previously experienced anaphylaxis with certain migraine medications.   Wt Readings from Last 50 Encounters:  12/05/24 219 lb (99.3 kg)  10/02/24 215 lb (97.5 kg)  10/02/24 215 lb (97.5 kg)  09/05/24 219 lb (99.3 kg)  07/30/24 231 lb (104.8 kg)  07/12/24 232 lb (105.2 kg)  05/23/24 231 lb (104.8 kg)  05/21/24 235 lb 0.2 oz (106.6 kg)  04/30/24 235 lb (106.6 kg)  03/29/24 230 lb 3.2 oz (104.4 kg)  03/27/24 230 lb 3.2 oz (104.4 kg)  01/16/24 226 lb (102.5 kg)  12/23/23 223 lb 15.8 oz (101.6 kg)  12/09/23 224 lb (101.6 kg)  11/22/23 222 lb 0.1 oz (100.7 kg)  11/18/23 222 lb (100.7 kg)  11/10/23 219 lb (99.3 kg)  10/18/23 226 lb (102.5 kg)  09/27/23 226 lb (102.5 kg)  09/22/23 228 lb (103.4 kg)  09/07/23 231 lb (104.8 kg)  06/29/23 230 lb (104.3 kg)  06/14/23 226 lb (102.5 kg)  05/03/23 218 lb 14.7 oz (99.3 kg)  04/22/23 219 lb (99.3 kg)  04/14/23 220 lb (99.8 kg)  01/18/23 203 lb 8 oz (92.3 kg)  11/04/22 181 lb (82.1 kg)  09/10/22 181 lb (82.1 kg)  08/19/22 170 lb (77.1 kg)  11/03/21 144 lb (65.3 kg)  03/22/21 160 lb (72.6 kg)  01/06/21 155 lb (70.3 kg)  12/30/19  155 lb (70.3 kg)  12/06/19 155 lb (70.3 kg)  08/11/19 156 lb (70.8 kg)  06/30/19 145 lb 1 oz (65.8 kg)  06/03/19 145 lb (65.8 kg)  05/25/19 145 lb (65.8 kg)  05/17/19 145 lb (65.8 kg)  05/08/19 145 lb (65.8 kg)  04/18/19 145 lb 1 oz (65.8 kg)  04/13/19 145 lb (65.8 kg)  04/12/19 140 lb (63.5 kg)  02/02/19 140 lb (63.5 kg)  01/18/19 145 lb (65.8 kg)  01/14/19 144 lb 13.5 oz (65.7 kg)  01/08/19 145 lb (65.8 kg)  01/01/19 138 lb (62.6 kg)  12/19/18 140 lb (63.5 kg)    BMI Readings from Last 50 Encounters:  12/05/24 34.30 kg/m  10/02/24 33.67 kg/m  10/02/24 33.67 kg/m  09/05/24 34.30 kg/m  07/30/24 36.18 kg/m  07/18/24 36.34 kg/m  07/12/24 36.34 kg/m  05/23/24 36.18 kg/m  05/21/24 36.81 kg/m  04/30/24 36.81 kg/m  03/29/24 36.05 kg/m  03/27/24 36.05 kg/m  01/16/24 35.40 kg/m  12/23/23 35.08 kg/m  12/09/23 35.08 kg/m  11/22/23 34.77 kg/m  11/18/23 34.77 kg/m  11/10/23 34.30 kg/m  10/18/23 35.40 kg/m  09/27/23 35.40 kg/m  09/22/23 35.71 kg/m  09/07/23 36.18 kg/m  06/29/23 36.02 kg/m  06/14/23 35.40 kg/m  05/03/23 34.29 kg/m  04/22/23 34.30 kg/m  04/14/23 34.46 kg/m  01/18/23 31.87 kg/m  11/04/22 28.35 kg/m  09/10/22 28.35 kg/m  08/19/22 26.63 kg/m  11/03/21 22.55 kg/m  03/22/21 25.06 kg/m  01/06/21 24.28 kg/m  12/30/19 24.28 kg/m  12/06/19 24.28 kg/m  08/11/19 24.43 kg/m  07/16/19 22.72 kg/m  06/30/19 22.72 kg/m  06/03/19 22.71 kg/m  05/25/19 22.71 kg/m  05/17/19 22.71 kg/m  05/08/19 22.71 kg/m  04/18/19 22.72 kg/m  04/13/19 22.71 kg/m  04/12/19 21.93 kg/m  02/02/19 21.93 kg/m  01/18/19 22.71 kg/m  01/14/19 22.69 kg/m  01/08/19 22.71 kg/m    Background Reviewed: Problem List: has Herpes simplex virus (HSV) infection; CONDYLOMA ACUMINATUM; Depression with anxiety; PANIC DISORDER WITHOUT AGORAPHOBIA; PANIC DISORDER WITH AGORAPHOBIA; DYSTHYMIC DISORDER; TOBACCO ABUSE; ALLERGIC RHINITIS; LUNG NODULE; DENTAL PAIN; STRICTURE AND STENOSIS OF ESOPHAGUS; GERD; ABDOMINAL WALL HERNIA; CONSTIPATION; ENDOMETRIOSIS; LENTIGO; CERVICAL RADICULOPATHY; BACK PAIN, THORACIC REGION; Fatigue; PALPITATIONS; Musculoskeletal malfunction arising from mental factors; Abdominal pain; Heartburn; Chronic pain syndrome; Chronic pain; DYSPHAGIA; Incisional hernia; Other postprocedural status(V45.89); Disorder of sacrum; Reactive hypoglycemia; Sciatica; 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; Bipolar 1  disorder, mixed, moderate (HCC); Bipolar affective disorder, current episode mixed (HCC); Arthritis; Cigarette nicotine  dependence without complication; Bilateral temporomandibular joint pain; Cervical radiculopathy; Chronic migraine without aura, intractable, with status migrainosus; History of bipolar disorder; Lumbar spondylosis; Paresthesias; Piriformis syndrome of both sides; Referred otalgia, bilateral; S/P hernia surgery; Tardive dyskinesia; Urticaria due to drug allergy; Partial small bowel obstruction (HCC); Nausea and vomiting; SBO (small bowel obstruction) (HCC); Persistent headaches; Blurry vision; Weight gain; S/P arthroscopy of right shoulder; Calcific supraspinatus tendonitis; Bursitis of right shoulder; Arthritis of right acromioclavicular joint; Chronic pain in left shoulder; Arthritis of left acromioclavicular joint; Bipolar 1 disorder, depressed, severe (HCC); Cushing's syndrome; Bile salt-induced diarrhea; Status post cholecystectomy; Hepatic steatosis; Flushing; Allodynia; Low testosterone; Fibromyalgia; and Intolerance, drug on their problem list. Past Medical History:  has a past medical history of ABDOMINAL WALL HERNIA (02/27/2010), Abnormal Pap smear, ALLERGIC RHINITIS (06/02/2007), ANOREXIA, CHRONIC (08/07/2008), ANXIETY (09/15/2010), Arthritis, BACK PAIN, THORACIC REGION (07/07/2007), Bipolar disorder (HCC), CERVICAL RADICULOPATHY (12/11/2008), Complication of anesthesia, Complication of anesthesia, Condyloma acuminatum (04/23/2009), CONSTIPATION (09/15/2010), DYSPHAGIA UNSPECIFIED (09/09/2009), Dysthymic disorder (06/20/2009), Eating disorder, ENDOMETRIOSIS (12/16/2009), FATIGUE (06/11/2010), Fibromyalgia, GERD (10/23/2009), Heart  palpitations, HERPES SIMPLEX INFECTION (06/11/2010), LENTIGO (04/23/2009), LUNG NODULE (06/17/2010), Narcotic abuse, continuous (HCC), Ovarian cyst, Palpitations (10/23/2009), Panic disorder, Rheumatoid arthritis(714.0), Stricture and stenosis of esophagus  (05/13/2010), TOBACCO ABUSE (08/07/2008), Urinary tract infection, and UTI (urinary tract infection). Past Surgical History:   has a past surgical history that includes Partial hysterectomy; Endometrial ablation; Hernia repair; Dilation and curettage of uterus; Cesarean section; esophageal dilatation x 4; Gum surgery; Abdominal surgery; Abdominal wall mesh  removal; Ablation on endometriosis; Shoulder surgery (Right, 09/2023); Cystoscopy (N/A, 11/22/2023); and Resection distal clavical (Left, 03/29/2024). Social History:   reports that she has quit smoking. Her smoking use included cigarettes. She has never used smokeless tobacco. She reports that she does not drink alcohol and does not use drugs. Family History:  family history includes Arthritis in her mother; Cancer in her father; Depression in her mother; Diabetes in her father; Heart attack in her father; Heart disease in her father; Hyperlipidemia in her father; Hypertension in her father; Stroke in her father. Allergies:  is allergic to lamictal [lamotrigine], maxalt  [rizatriptan ], morphine  and codeine , nicotine , wellbutrin  [bupropion ], zofran , chantix  [varenicline  tartrate], ciprofloxacin , codeine , haldol  [haloperidol ], ibuprofen, lidoderm  [lidocaine ], skelaxin [metaxalone], sulfa antibiotics, sulfasalazine, toradol  [ketorolac  tromethamine ], ambien [zolpidem], doxycycline , duloxetine, ingrezza [valbenazine tosylate], lyrica [pregabalin], methadone, neurontin  [gabapentin ], other, prednisone , amoxicillin, aripiprazole, chlorhexidine , penicillins, and tramadol .   Medication Reconciliation: Current Outpatient Medications on File Prior to Visit  Medication Sig   calcium carbonate (TUMS - DOSED IN MG ELEMENTAL CALCIUM) 500 MG chewable tablet Chew 1 tablet by mouth 4 (four) times daily as needed for indigestion or heartburn.   Cholecalciferol (VITAMIN D3 PO) Take 2 each by mouth daily. Gummy   cholestyramine  (QUESTRAN ) 4 g packet Take 1 packet (4 g total)  by mouth 3 (three) times daily with meals.   EC-RX Testosterone 0.2 % CREA Place onto the skin.   escitalopram  (LEXAPRO ) 10 MG tablet Take 10 mg by mouth daily.   hyoscyamine  (LEVSIN  SL) 0.125 MG SL tablet Place 0.125 mg under the tongue every 4 (four) hours as needed for cramping.   LORazepam  (ATIVAN ) 1 MG tablet Take 1 mg by mouth every 8 (eight) hours as needed for anxiety.   lurasidone  (LATUDA ) 40 MG TABS tablet Take 40 mg by mouth every evening.   methocarbamol  (ROBAXIN ) 500 MG tablet TAKE 1 TABLET BY MOUTH EVERY 8 HOURS AS NEEDED FOR UP TO 7 DAYS   Naltrexone  POWD Take 5 mg by mouth daily at 6 (six) AM. Don't start until 10 days after completing percocet. Reduces fibromyalgia pain flares long term, but blocks opioid effects.   promethazine  (PHENERGAN ) 25 MG tablet Take 25 mg by mouth every 6 (six) hours as needed for nausea or vomiting.   valACYclovir  (VALTREX ) 500 MG tablet Take 1 tablet (500 mg total) by mouth daily.   No current facility-administered medications on file prior to visit.   Medications Discontinued During This Encounter  Medication Reason   oxycodone -acetaminophen  (LYNOX) 5-300 MG tablet Reorder     Physical Exam:    12/05/2024   12:48 PM 11/27/2024    8:56 PM 11/27/2024    5:08 PM  Vitals with BMI  Height 5' 7    Weight 219 lbs    BMI 34.29    Systolic 110 107 870  Diastolic 72 63 99  Pulse 82 67 89  Vital signs reviewed.  Nursing notes reviewed. Weight trend reviewed. Physical Activity: Sufficiently Active (09/05/2024)   Exercise Vital Sign    Days of Exercise per Week:  5 days    Minutes of Exercise per Session: 30 min   General Appearance:  No acute distress appreciable.   Well-groomed, healthy-appearing female.  Well proportioned with no abnormal fat distribution.  Good muscle tone. Pulmonary:  Normal work of breathing at rest, no respiratory distress apparent. SpO2: 98 %  Musculoskeletal: All extremities are intact.  Neurological:  Awake, alert,  oriented, and engaged.  No obvious focal neurological deficits or cognitive impairments.  Sensorium seems unclouded.   Speech is clear and coherent with logical content. Psychiatric:  Appropriate mood, pleasant and cooperative demeanor, thoughtful and engaged during the exam   Verbalized to patient: Physical Exam MEASUREMENTS: Weight- 219.  Results Radiology Abdominal and pelvic CT (11/27/2024): Stable simple right renal cyst, no evidence of appendicitis, normal small bowel, normal appendix, no bowel obstruction, no abnormal intraluminal air, abdominal wall hernia present but not implicated in current symptoms.     09/05/2024    8:22 AM 07/30/2024    3:15 PM 07/25/2024    4:08 PM 07/23/2024    2:05 PM  PHQ 2/9 Scores  PHQ - 2 Score 0 0    PHQ- 9 Score         Information is confidential and restricted. Go to Review Flowsheets to unlock data.   Admission on 11/27/2024, Discharged on 11/27/2024  Component Date Value Ref Range Status   Lipase 11/27/2024 27  11 - 51 U/L Final   Sodium 11/27/2024 137  135 - 145 mmol/L Final   Potassium 11/27/2024 4.4  3.5 - 5.1 mmol/L Final   Chloride 11/27/2024 102  98 - 111 mmol/L Final   CO2 11/27/2024 23  22 - 32 mmol/L Final   Glucose, Bld 11/27/2024 108 (H)  70 - 99 mg/dL Final   BUN 98/72/7973 13  6 - 20 mg/dL Final   Creatinine, Ser 11/27/2024 0.72  0.44 - 1.00 mg/dL Final   Calcium 98/72/7973 9.1  8.9 - 10.3 mg/dL Final   Total Protein 98/72/7973 7.0  6.5 - 8.1 g/dL Final   Albumin 98/72/7973 4.3  3.5 - 5.0 g/dL Final   AST 98/72/7973 20  15 - 41 U/L Final   ALT 11/27/2024 16  0 - 44 U/L Final   Alkaline Phosphatase 11/27/2024 136 (H)  38 - 126 U/L Final   Total Bilirubin 11/27/2024 0.3  0.0 - 1.2 mg/dL Final   GFR, Estimated 11/27/2024 >60  >60 mL/min Final   Anion gap 11/27/2024 11  5 - 15 Final   WBC 11/27/2024 8.2  4.0 - 10.5 K/uL Final   RBC 11/27/2024 4.83  3.87 - 5.11 MIL/uL Final   Hemoglobin 11/27/2024 15.0  12.0 - 15.0 g/dL  Final   HCT 98/72/7973 44.5  36.0 - 46.0 % Final   MCV 11/27/2024 92.1  80.0 - 100.0 fL Final   MCH 11/27/2024 31.1  26.0 - 34.0 pg Final   MCHC 11/27/2024 33.7  30.0 - 36.0 g/dL Final   RDW 98/72/7973 12.2  11.5 - 15.5 % Final   Platelets 11/27/2024 255  150 - 400 K/uL Final   nRBC 11/27/2024 0.0  0.0 - 0.2 % Final   Color, Urine 11/27/2024 YELLOW  YELLOW Final   APPearance 11/27/2024 CLEAR  CLEAR Final   Specific Gravity, Urine 11/27/2024 RESULTS UNAVAILABLE DUE TO INTERFERING SUBSTANCE  1.005 - 1.030 Final   pH 11/27/2024 5.0  5.0 - 8.0 Final   Glucose, UA 11/27/2024 NEGATIVE  NEGATIVE mg/dL Final   Hgb urine dipstick 11/27/2024 NEGATIVE  NEGATIVE Final   Bilirubin Urine 11/27/2024 NEGATIVE  NEGATIVE Final   Ketones, ur 11/27/2024 NEGATIVE  NEGATIVE mg/dL Final   Protein, ur 98/72/7973 NEGATIVE  NEGATIVE mg/dL Final   Nitrite 98/72/7973 NEGATIVE  NEGATIVE Final   Leukocytes,Ua 11/27/2024 NEGATIVE  NEGATIVE Final  Admission on 10/02/2024, Discharged on 10/02/2024  Component Date Value Ref Range Status   Sodium 10/02/2024 135  135 - 145 mmol/L Final   Potassium 10/02/2024 4.1  3.5 - 5.1 mmol/L Final   Chloride 10/02/2024 101  98 - 111 mmol/L Final   CO2 10/02/2024 22  22 - 32 mmol/L Final   Glucose, Bld 10/02/2024 116 (H)  70 - 99 mg/dL Final   BUN 87/97/7974 15  6 - 20 mg/dL Final   Creatinine, Ser 10/02/2024 0.52  0.44 - 1.00 mg/dL Final   Calcium 87/97/7974 9.4  8.9 - 10.3 mg/dL Final   Total Protein 87/97/7974 6.6  6.5 - 8.1 g/dL Final   Albumin 87/97/7974 4.1  3.5 - 5.0 g/dL Final   AST 87/97/7974 17  15 - 41 U/L Final   ALT 10/02/2024 14  0 - 44 U/L Final   Alkaline Phosphatase 10/02/2024 133 (H)  38 - 126 U/L Final   Total Bilirubin 10/02/2024 0.3  0.0 - 1.2 mg/dL Final   GFR, Estimated 10/02/2024 >60  >60 mL/min Final   Anion gap 10/02/2024 12  5 - 15 Final   WBC 10/02/2024 8.6  4.0 - 10.5 K/uL Final   RBC 10/02/2024 4.73  3.87 - 5.11 MIL/uL Final   Hemoglobin  10/02/2024 14.9  12.0 - 15.0 g/dL Final   HCT 87/97/7974 43.1  36.0 - 46.0 % Final   MCV 10/02/2024 91.1  80.0 - 100.0 fL Final   MCH 10/02/2024 31.5  26.0 - 34.0 pg Final   MCHC 10/02/2024 34.6  30.0 - 36.0 g/dL Final   RDW 87/97/7974 12.3  11.5 - 15.5 % Final   Platelets 10/02/2024 271  150 - 400 K/uL Final   nRBC 10/02/2024 0.0  0.0 - 0.2 % Final  Office Visit on 07/30/2024  Component Date Value Ref Range Status   SARS Coronavirus 2 Ag 07/30/2024 Negative  Negative Final  Lab on 07/27/2024  Component Date Value Ref Range Status   Salivary Cortisol Baseline 07/27/2024 0.392  ug/dL Final   Salivary Cortisol 2nd Specimen 07/27/2024 0.058  ug/dL Final   Salivary Cortisol 3rd Specimen 07/27/2024 CANCELED  ug/dL Final-Edited   Salivary Cortisol 4th Specimen 07/27/2024 0.161  ug/dL Final  Orders Only on 90/75/7974  Component Date Value Ref Range Status   Color, Urine 07/25/2024 YELLOW  YELLOW Final   APPearance 07/25/2024 CLEAR  CLEAR Final   Specific Gravity, Urine 07/25/2024 1.012  1.001 - 1.035 Final   pH 07/25/2024 7.5  5.0 - 8.0 Final   Glucose, UA 07/25/2024 NEGATIVE  NEGATIVE Final   Bilirubin Urine 07/25/2024 NEGATIVE  NEGATIVE Final   Ketones, ur 07/25/2024 NEGATIVE  NEGATIVE Final   Hgb urine dipstick 07/25/2024 NEGATIVE  NEGATIVE Final   Protein, ur 07/25/2024 NEGATIVE  NEGATIVE Final   Nitrites, Initial 07/25/2024 NEGATIVE  NEGATIVE Final   Leukocyte Esterase 07/25/2024 NEGATIVE  NEGATIVE Final   WBC, UA 07/25/2024 NONE SEEN  0 - 5 /HPF Final   RBC / HPF 07/25/2024 NONE SEEN  0 - 2 /HPF Final   Squamous Epithelial / HPF 07/25/2024 0-5  < OR = 5 /HPF Final   Bacteria, UA 07/25/2024 NONE SEEN  NONE SEEN /HPF Final  Hyaline Cast 07/25/2024 NONE SEEN  NONE SEEN /LPF Final   Note 07/25/2024    Final   Reflexve Urine Culture 07/25/2024    Final   Tracking House Account 07/25/2024    Final  Lab on 07/20/2024  Component Date Value Ref Range Status   TSH W/REFLEX TO FT4  07/20/2024 4.64 (H)  mIU/L Final   Cholesterol 07/20/2024 188  0 - 200 mg/dL Final   Triglycerides 90/80/7974 325.0 (H)  0.0 - 149.0 mg/dL Final   HDL 90/80/7974 34.80 (L)  >60.99 mg/dL Final   VLDL 90/80/7974 65.0 (H)  0.0 - 40.0 mg/dL Final   LDL Cholesterol 07/20/2024 88  0 - 99 mg/dL Final   Total CHOL/HDL Ratio 07/20/2024 5   Final   NonHDL 07/20/2024 153.18   Final   Hgb A1c MFr Bld 07/20/2024 5.5  4.6 - 6.5 % Final   Cortisol, Plasma 07/20/2024 4.8  ug/dL Final   Sodium 90/80/7974 137  135 - 145 mEq/L Final   Potassium 07/20/2024 4.2  3.5 - 5.1 mEq/L Final   Chloride 07/20/2024 105  96 - 112 mEq/L Final   CO2 07/20/2024 25  19 - 32 mEq/L Final   Glucose, Bld 07/20/2024 87  70 - 99 mg/dL Final   BUN 90/80/7974 10  6 - 23 mg/dL Final   Creatinine, Ser 07/20/2024 0.62  0.40 - 1.20 mg/dL Final   Total Bilirubin 07/20/2024 0.3  0.2 - 1.2 mg/dL Final   Alkaline Phosphatase 07/20/2024 120 (H)  39 - 117 U/L Final   AST 07/20/2024 12  0 - 37 U/L Final   ALT 07/20/2024 11  0 - 35 U/L Final   Total Protein 07/20/2024 6.3  6.0 - 8.3 g/dL Final   Albumin 90/80/7974 4.2  3.5 - 5.2 g/dL Final   GFR 90/80/7974 106.72  >60.00 mL/min Final   Calcium 07/20/2024 9.2  8.4 - 10.5 mg/dL Final   Sodium 90/80/7974 137  135 - 145 mEq/L Final   Potassium 07/20/2024 4.2  3.5 - 5.1 mEq/L Final   Chloride 07/20/2024 105  96 - 112 mEq/L Final   CO2 07/20/2024 25  19 - 32 mEq/L Final   Glucose, Bld 07/20/2024 87  70 - 99 mg/dL Final   BUN 90/80/7974 10  6 - 23 mg/dL Final   Creatinine, Ser 07/20/2024 0.62  0.40 - 1.20 mg/dL Final   GFR 90/80/7974 106.72  >60.00 mL/min Final   Calcium 07/20/2024 9.2  8.4 - 10.5 mg/dL Final   R793 ACTH  07/20/2024 7  6 - 50 pg/mL Final   5-HIAA, Plasma 07/20/2024 7.9  ng/mL Final   Color, Urine 07/20/2024 CANCELED   Final   Free T4 07/20/2024 0.9  0.8 - 1.8 ng/dL Final   Color, Urine 90/75/7974 YELLOW  YELLOW Final   APPearance 07/25/2024 CLEAR  CLEAR Final   Specific  Gravity, Urine 07/25/2024 1.010  1.001 - 1.035 Final   pH 07/25/2024 6.5  5.0 - 8.0 Final   Glucose, UA 07/25/2024 NEGATIVE  NEGATIVE Final   Bilirubin Urine 07/25/2024 NEGATIVE  NEGATIVE Final   Ketones, ur 07/25/2024 NEGATIVE  NEGATIVE Final   Hgb urine dipstick 07/25/2024 NEGATIVE  NEGATIVE Final   Protein, ur 07/25/2024 NEGATIVE  NEGATIVE Final   Nitrites, Initial 07/25/2024 NEGATIVE  NEGATIVE Final   Leukocyte Esterase 07/25/2024 NEGATIVE  NEGATIVE Final   WBC, UA 07/25/2024 NONE SEEN  0 - 5 /HPF Final   RBC / HPF 07/25/2024 NONE SEEN  0 - 2 /HPF  Final   Squamous Epithelial / HPF 07/25/2024 0-5  < OR = 5 /HPF Final   Bacteria, UA 07/25/2024 NONE SEEN  NONE SEEN /HPF Final   Hyaline Cast 07/25/2024 NONE SEEN  NONE SEEN /LPF Final   Note 07/25/2024    Final   24 Hour urine volume (VMAHVA) 07/25/2024 2,500  mL Final   Cortisol (Ur), Free 07/25/2024 27.5  4.0 - 50.0 mcg/24 h Final   CREATININE, URINE 07/25/2024 1.30  0.50 - 2.15 g/24 h Final   Reflexve Urine Culture 07/25/2024    Final  Admission on 07/12/2024, Discharged on 07/12/2024  Component Date Value Ref Range Status   Glucose-Capillary 07/12/2024 78  70 - 99 mg/dL Final   WBC 90/88/7974 8.2  4.0 - 10.5 K/uL Final   RBC 07/12/2024 4.48  3.87 - 5.11 MIL/uL Final   Hemoglobin 07/12/2024 13.9  12.0 - 15.0 g/dL Final   HCT 90/88/7974 42.2  36.0 - 46.0 % Final   MCV 07/12/2024 94.2  80.0 - 100.0 fL Final   MCH 07/12/2024 31.0  26.0 - 34.0 pg Final   MCHC 07/12/2024 32.9  30.0 - 36.0 g/dL Final   RDW 90/88/7974 11.9  11.5 - 15.5 % Final   Platelets 07/12/2024 267  150 - 400 K/uL Final   nRBC 07/12/2024 0.0  0.0 - 0.2 % Final   Neutrophils Relative % 07/12/2024 53  % Final   Neutro Abs 07/12/2024 4.4  1.7 - 7.7 K/uL Final   Lymphocytes Relative 07/12/2024 34  % Final   Lymphs Abs 07/12/2024 2.8  0.7 - 4.0 K/uL Final   Monocytes Relative 07/12/2024 10  % Final   Monocytes Absolute 07/12/2024 0.8  0.1 - 1.0 K/uL Final    Eosinophils Relative 07/12/2024 2  % Final   Eosinophils Absolute 07/12/2024 0.2  0.0 - 0.5 K/uL Final   Basophils Relative 07/12/2024 1  % Final   Basophils Absolute 07/12/2024 0.0  0.0 - 0.1 K/uL Final   Immature Granulocytes 07/12/2024 0  % Final   Abs Immature Granulocytes 07/12/2024 0.02  0.00 - 0.07 K/uL Final   Sodium 07/12/2024 139  135 - 145 mmol/L Final   Potassium 07/12/2024 3.9  3.5 - 5.1 mmol/L Final   Chloride 07/12/2024 105  98 - 111 mmol/L Final   CO2 07/12/2024 23  22 - 32 mmol/L Final   Glucose, Bld 07/12/2024 85  70 - 99 mg/dL Final   BUN 90/88/7974 9  6 - 20 mg/dL Final   Creatinine, Ser 07/12/2024 0.66  0.44 - 1.00 mg/dL Final   Calcium 90/88/7974 8.9  8.9 - 10.3 mg/dL Final   Total Protein 90/88/7974 6.2 (L)  6.5 - 8.1 g/dL Final   Albumin 90/88/7974 4.0  3.5 - 5.0 g/dL Final   AST 90/88/7974 17  15 - 41 U/L Final   ALT 07/12/2024 9  0 - 44 U/L Final   Alkaline Phosphatase 07/12/2024 120  38 - 126 U/L Final   Total Bilirubin 07/12/2024 <0.2  0.0 - 1.2 mg/dL Final   GFR, Estimated 07/12/2024 >60  >60 mL/min Final   Anion gap 07/12/2024 10  5 - 15 Final   Color, Urine 07/12/2024 YELLOW  YELLOW Final   APPearance 07/12/2024 CLEAR  CLEAR Final   Specific Gravity, Urine 07/12/2024 1.005  1.005 - 1.030 Final   pH 07/12/2024 7.0  5.0 - 8.0 Final   Glucose, UA 07/12/2024 NEGATIVE  NEGATIVE mg/dL Final   Hgb urine dipstick 07/12/2024 NEGATIVE  NEGATIVE  Final   Bilirubin Urine 07/12/2024 NEGATIVE  NEGATIVE Final   Ketones, ur 07/12/2024 NEGATIVE  NEGATIVE mg/dL Final   Protein, ur 90/88/7974 NEGATIVE  NEGATIVE mg/dL Final   Nitrite 90/88/7974 NEGATIVE  NEGATIVE Final   Leukocytes,Ua 07/12/2024 NEGATIVE  NEGATIVE Final  Admission on 07/09/2024, Discharged on 07/10/2024  Component Date Value Ref Range Status   WBC 07/09/2024 9.5  4.0 - 10.5 K/uL Final   RBC 07/09/2024 4.55  3.87 - 5.11 MIL/uL Final   Hemoglobin 07/09/2024 14.4  12.0 - 15.0 g/dL Final   HCT 90/91/7974  41.3  36.0 - 46.0 % Final   MCV 07/09/2024 90.8  80.0 - 100.0 fL Final   MCH 07/09/2024 31.6  26.0 - 34.0 pg Final   MCHC 07/09/2024 34.9  30.0 - 36.0 g/dL Final   RDW 90/91/7974 11.9  11.5 - 15.5 % Final   Platelets 07/09/2024 291  150 - 400 K/uL Final   nRBC 07/09/2024 0.0  0.0 - 0.2 % Final   Sodium 07/09/2024 136  135 - 145 mmol/L Final   Potassium 07/09/2024 4.0  3.5 - 5.1 mmol/L Final   Chloride 07/09/2024 102  98 - 111 mmol/L Final   CO2 07/09/2024 21 (L)  22 - 32 mmol/L Final   Glucose, Bld 07/09/2024 91  70 - 99 mg/dL Final   BUN 90/91/7974 11  6 - 20 mg/dL Final   Creatinine, Ser 07/09/2024 0.71  0.44 - 1.00 mg/dL Final   Calcium 90/91/7974 9.8  8.9 - 10.3 mg/dL Final   GFR, Estimated 07/09/2024 >60  >60 mL/min Final   Anion gap 07/09/2024 14  5 - 15 Final  Appointment on 05/23/2024  Component Date Value Ref Range Status   Vitamin B-12 05/23/2024 574  211 - 911 pg/mL Final   Vit D, 25-Hydroxy 05/23/2024 36  30 - 100 ng/mL Final  Admission on 05/21/2024, Discharged on 05/22/2024  Component Date Value Ref Range Status   Lipase 05/21/2024 28  11 - 51 U/L Final   Sodium 05/21/2024 135  135 - 145 mmol/L Final   Potassium 05/21/2024 3.7  3.5 - 5.1 mmol/L Final   Chloride 05/21/2024 102  98 - 111 mmol/L Final   CO2 05/21/2024 23  22 - 32 mmol/L Final   Glucose, Bld 05/21/2024 96  70 - 99 mg/dL Final   BUN 92/78/7974 10  6 - 20 mg/dL Final   Creatinine, Ser 05/21/2024 0.69  0.44 - 1.00 mg/dL Final   Calcium 92/78/7974 9.5  8.9 - 10.3 mg/dL Final   Total Protein 92/78/7974 7.2  6.5 - 8.1 g/dL Final   Albumin 92/78/7974 4.1  3.5 - 5.0 g/dL Final   AST 92/78/7974 18  15 - 41 U/L Final   ALT 05/21/2024 17  0 - 44 U/L Final   Alkaline Phosphatase 05/21/2024 111  38 - 126 U/L Final   Total Bilirubin 05/21/2024 0.7  0.0 - 1.2 mg/dL Final   GFR, Estimated 05/21/2024 >60  >60 mL/min Final   Anion gap 05/21/2024 10  5 - 15 Final   WBC 05/21/2024 9.7  4.0 - 10.5 K/uL Final   RBC  05/21/2024 4.81  3.87 - 5.11 MIL/uL Final   Hemoglobin 05/21/2024 14.9  12.0 - 15.0 g/dL Final   HCT 92/78/7974 45.4  36.0 - 46.0 % Final   MCV 05/21/2024 94.4  80.0 - 100.0 fL Final   MCH 05/21/2024 31.0  26.0 - 34.0 pg Final   MCHC 05/21/2024 32.8  30.0 - 36.0  g/dL Final   RDW 92/78/7974 11.9  11.5 - 15.5 % Final   Platelets 05/21/2024 271  150 - 400 K/uL Final   nRBC 05/21/2024 0.0  0.0 - 0.2 % Final   Color, Urine 05/22/2024 YELLOW  YELLOW Final   APPearance 05/22/2024 CLEAR  CLEAR Final   Specific Gravity, Urine 05/22/2024 1.028  1.005 - 1.030 Final   pH 05/22/2024 6.0  5.0 - 8.0 Final   Glucose, UA 05/22/2024 NEGATIVE  NEGATIVE mg/dL Final   Hgb urine dipstick 05/22/2024 NEGATIVE  NEGATIVE Final   Bilirubin Urine 05/22/2024 NEGATIVE  NEGATIVE Final   Ketones, ur 05/22/2024 NEGATIVE  NEGATIVE mg/dL Final   Protein, ur 92/77/7974 NEGATIVE  NEGATIVE mg/dL Final   Nitrite 92/77/7974 NEGATIVE  NEGATIVE Final   Leukocytes,Ua 05/22/2024 NEGATIVE  NEGATIVE Final  There may be more visits with results that are not included.  No image results found. CT ABDOMEN PELVIS W CONTRAST Result Date: 11/27/2024 EXAM: CT ABDOMEN AND PELVIS WITH CONTRAST 11/27/2024 07:20:00 PM TECHNIQUE: CT of the abdomen and pelvis was performed with the administration of 100 mL of iohexol  (OMNIPAQUE ) 300 MG/ML solution. Multiplanar reformatted images are provided for review. Automated exposure control, iterative reconstruction, and/or weight-based adjustment of the mA/kV was utilized to reduce the radiation dose to as low as reasonably achievable. COMPARISON: None available. CLINICAL HISTORY: Right lower quadrant pain. FINDINGS: LOWER CHEST: No acute abnormality. LIVER: Mild fatty infiltration of the liver is again identified. GALLBLADDER AND BILE DUCTS: The gallbladder has been surgically removed. No biliary ductal dilatation. SPLEEN: No acute abnormality. PANCREAS: No acute abnormality. ADRENAL GLANDS: No acute  abnormality. KIDNEYS, URETERS AND BLADDER: No renal calculi or obstructive changes are noted. A simple cyst is noted within the right kidney, stable from the prior exam. Per consensus, no follow-up is needed for simple Bosniak type 1 and 2 renal cysts, unless the patient has a malignancy history or risk factors. The bladder is partially distended. GI AND BOWEL: Stomach demonstrates no acute abnormality. The small bowel is unremarkable. No obstructive or inflammatory changes of the colon are noted. The appendix is within normal limits. There is no bowel obstruction. PERITONEUM AND RETROPERITONEUM: No free fluid is noted. No free air. VASCULATURE: Aorta is normal in caliber. LYMPH NODES: No lymphadenopathy. REPRODUCTIVE ORGANS: The uterus has been surgically removed. BONES AND SOFT TISSUES: No bony abnormalities are seen. No focal soft tissue abnormality. IMPRESSION: 1. No acute findings in the abdomen or pelvis to explain right lower quadrant pain. Electronically signed by: Oneil Devonshire MD 11/27/2024 07:53 PM EST RP Workstation: [id number]         ASSESSMENT & PLAN   Assessment & Plan Chronic migraine without aura, intractable, with status migrainosus Tension headache Headaches associated with TMS therapy are characterized by severe pain and tightness, likely due to muscle tension from the procedure. Previous treatments with Tylenol  3 and methocarbamol  were ineffective. She experiences significant discomfort and is concerned about the impact on daily functioning. Nurtec ODT is prescribed for migraine prevention, to be taken before TMS sessions. Zanaflex  is prescribed for muscle relaxation to alleviate headache symptoms. Oxycodone -acetaminophen  is advised if Nurtec and Zanaflex  are ineffective. Potential side effects of Zanaflex , including sedation, were discussed. Generalized abdominal pain Abdominal adhesions Recurrent abdominal pain is likely due to adhesions, given her history of multiple abdominal  surgeries and a recent CT scan showing no acute findings. The pain is severe and intermittent, with a recent episode requiring an ER visit. Surgery is not recommended due to  the risk of recurrence and potential complications, and she is apprehensive about it due to previous experiences. A handout on managing adhesion-related pain without surgery was provided. She should maintain contact with a surgeon for potential future interventions. Dietary modifications and physical therapy are advised to manage symptoms. Oxycodone -acetaminophen  is prescribed for severe pain episodes, with caution regarding potential bowel obstruction. Continued physical therapy and pelvic floor exercises are encouraged. Depressive disorder Depression is managed with TMS therapy, which has shown improvement in symptoms, particularly nightmares. She is also on escitalopram  and lurasidone . Saffron supplementation is recommended as an adjunctive treatment for mood enhancement. TMS therapy should continue for depression management.  Reconsideration underway of bipolar diagnosis may have been incorrect. Defer to psychiatry team.  ORDER ASSOCIATIONS  #   DIAGNOSIS / CONDITION ICD-10 ENCOUNTER ORDER     ICD-10-CM   1. Chronic migraine without aura, intractable, with status migrainosus  G43.711 Ambulatory referral to Physical Therapy    Rimegepant Sulfate (NURTEC) 75 MG TBDP    2. Generalized abdominal pain  R10.84 oxycodone -acetaminophen  (LYNOX) 5-300 MG tablet    3. Abdominal adhesions  K66.0 oxycodone -acetaminophen  (LYNOX) 5-300 MG tablet    4. Tension headache  G44.209 tiZANidine  (ZANAFLEX ) 4 MG tablet           Orders Placed in Encounter:   Referral Orders         Ambulatory referral to Physical Therapy     Meds ordered this encounter  Medications   oxycodone -acetaminophen  (LYNOX) 5-300 MG tablet    Sig: Take 1 tablet by mouth every 4 (four) hours as needed for pain.    Dispense:  15 tablet    Refill:  0    Rimegepant Sulfate (NURTEC) 75 MG TBDP    Sig: Take 1 tablet (75 mg total) by mouth daily.    Dispense:  10 tablet    Refill:  2    For digital savings card, visit [email or web address].[email or web address]/   call [phone] , process savings card as secondary payer.   tiZANidine  (ZANAFLEX ) 4 MG tablet    Sig: Take 1 tablet (4 mg total) by mouth every 6 (six) hours as needed for muscle spasms.    Dispense:  30 tablet    Refill:  0    Orders Placed This Encounter  Procedures   Ambulatory referral to Physical Therapy    Referral Priority:   Routine    Referral Type:   Physical Medicine    Referral Reason:   Specialty Services Required    Requested Specialty:   Physical Therapy    Number of Visits Requested:   1   ED Discharge Orders          Ordered    Ambulatory referral to Physical Therapy       Comments: ### Physical Therapy Referral for Chronic Abdominal Pain Related to Adhesions     **Patient Name:** [Patient Name]        **Date of Birth:** [DOB]        **Date:** [Date]      **Diagnosis:** Chronic abdominal pain, suspected recurrent adhesions status post abdominal adhesiolysis      **Relevant History:**      - History of multiple abdominal/pelvic surgeries including cholecystectomy, hysterectomy, and abdominal adhesiolysis several years ago      - Recent episode of severe migratory abdominal pain      - CT abdomen/pelvis with contrast: no acute abnormality, no bowel obstruction, no inflammatory changes      - Pain flares despite conservative  management      **Reason for Referral:**      Patient is referred for specialized physical therapy evaluation and treatment for chronic abdominal pain related to suspected intra-abdominal adhesions. The [employer] recommends pelvic floor physical therapy for managing myofascial causes and consequences of chronic pelvic pain, as routine laparoscopic adhesiolysis is not recommended for chronic pain  management.[1][2]      **Requested Physical Therapy Interventions:**      **Manual Therapy Techniques:**      - Soft tissue mobilization (STM) targeting abdominal and pelvic regions      - Visceral mobilization/manipulation to address adhesion-related restrictions      - Myofascial release techniques for abdominal wall and pelvic floor      - External and internal tissue mobilization as appropriate      Evidence supports that manual physical therapy, including soft tissue mobilization and visceral manipulation, can significantly improve pain, quality of life, and functional outcomes in patients with adhesion-related chronic abdominal pain.[1][3][4] Case reports and controlled studies demonstrate substantial pain reduction and improved function following 5-10 sessions of specialized manual therapy over 3-7 weeks.[1][4]      **Therapeutic Exercise:**      - Core stabilization and strengthening exercises      - Pelvic floor muscle retraining and stretching      - Trunk mobility and flexibility exercises      - Breathing exercises and diaphragmatic training      - Progressive functional training      **Additional Modalities as Indicated:**      - Heat therapy application      - Biofeedback for pelvic floor dysfunction if present      - Patient education on self-mobilization techniques      - Home exercise program development      **Treatment Goals:**      1. Reduce abdominal pain intensity and frequency of pain flares      2. Improve trunk range of motion and functional mobility      3. Enhance quality of life and ability to perform activities of daily living      4. Decrease reliance on pain medications      5. Prevent recurrent bowel obstruction episodes      6. Educate patient on self-management strategies      **Specific Considerations:**      - Patient has history of TMS therapy with associated headaches (migraine)      - Avoid aggressive techniques that could  exacerbate pain      - Monitor for any signs/symptoms of bowel obstruction during treatment      - Coordinate care with pain management and gastroenterology as needed      **Recommended Frequency:**      Initial evaluation followed by 1-2 sessions per week for 4-8 weeks, with reassessment of progress and adjustment of treatment plan as indicated. Studies suggest optimal outcomes with 5-10 treatment sessions over 3-7 weeks.[1][4]      **Expected Outcomes:**      Based on current evidence, patients with adhesion-related chronic abdominal pain treated with specialized manual physical therapy demonstrate:      - Significant improvements in pain severity (p  0.01)      - Enhanced quality of life scores (p  0.01)      - Improved gastrointestinal symptoms (p  0.05)      - Increased trunk range of motion (p  0.001)      - Reduced frequency of  bowel obstruction episodes[3][4]      Please provide comprehensive evaluation and develop individualized treatment plan. Request progress updates every 4 weeks and notification if patient demonstrates red flag symptoms requiring medical re-evaluation (severe worsening pain, inability to pass gas/stool, vomiting, fever).      Thank you for your expertise in managing this complex patient.      **Physician Signature:** ___________________________  **      **Date:** ___________________________**      ### References  1. Soft Tissue Mobilization to Resolve Chronic Pain and Dysfunction Associated With Postoperative Abdominal and Pelvic Adhesions: A Case Report. Cyrena DEEM, Smith RW, Koppenhaver S. The Journal of Orthopaedic and Sports Physical Therapy. 2015;45(12):1006-16. doi:10.2519/[email or web address].7984.4233. 2. Chronic Pelvic Pain: ACOG Practice Bulletin, Number 218. Obstetrics and Gynecology. 2020;135(3):e98-e109. doi:10.1097/[id number]. 3. Decreasing Recurrent Bowel Obstructions, Improving Quality of Life With Physiotherapy: Controlled Study. Rice AD,  Jakie MARLA Rase ED, et al. World Journal of Gastroenterology. 2018;24(19):2108-2119. doi:10.3748/[email or web address]. 4. Treating Small Bowel Obstruction With a Manual Physical Therapy: A Prospective Efficacy Study. Rice AD, Jakie MARLA Rase ED, et al. [employer]. [phone]. doi:10.1155/2016/[id number].   12/05/24 1333    oxycodone -acetaminophen  (LYNOX) 5-300 MG tablet  Every 4 hours PRN        12/05/24 1333    Rimegepant Sulfate (NURTEC) 75 MG TBDP  Daily       Note to Pharmacy: For digital savings card, visit [email or web address]/   call [phone] , process savings card as secondary payer.   12/05/24 1333    tiZANidine  (ZANAFLEX ) 4 MG tablet  Every 6 hours PRN        12/05/24 1333              This document was synthesized by artificial intelligence ([employer]bridge) using HIPAA-compliant recording of the clinical interaction;   We discussed the use of AI scribe software for clinical note transcription with the patient, who gave verbal consent to proceed. additional Info: This encounter employed state-of-the-art, real-time, collaborative documentation. The patient actively reviewed and assisted in updating their electronic medical record on a shared screen, ensuring transparency and facilitating joint problem-solving for the problem list, overview, and plan. This approach promotes accurate, informed care. The treatment plan was discussed and reviewed in detail, including medication safety, potential side effects, and all patient questions. We confirmed understanding and comfort with the plan. Follow-up instructions were established, including contacting the office for any concerns, returning if symptoms worsen, persist, or new symptoms develop, and precautions for potential emergency department visits.

## 2024-12-05 NOTE — Assessment & Plan Note (Signed)
 Headaches associated with TMS therapy are characterized by severe pain and tightness, likely due to muscle tension from the procedure. Previous treatments with Tylenol  3 and methocarbamol  were ineffective. She experiences significant discomfort and is concerned about the impact on daily functioning. Nurtec ODT is prescribed for migraine prevention, to be taken before TMS sessions. Zanaflex  is prescribed for muscle relaxation to alleviate headache symptoms. Oxycodone -acetaminophen  is advised if Nurtec and Zanaflex  are ineffective. Potential side effects of Zanaflex , including sedation, were discussed.

## 2024-12-05 NOTE — Patient Instructions (Addendum)
 It was a pleasure seeing you today! Your health and satisfaction are our top priorities.  Gina Cone, MD  VISIT SUMMARY: During your visit, we discussed your ongoing abdominal pain and headaches. Your abdominal pain is likely due to adhesions from previous surgeries, and your headaches are associated with your TMS therapy. We also reviewed your depression management and overall health.  YOUR PLAN: -ABDOMINAL ADHESIONS WITH RECURRENT ABDOMINAL PAIN: Abdominal adhesions are bands of scar tissue that can form after surgery and cause organs to stick together, leading to pain. Surgery is not recommended due to the risk of recurrence and complications. You should follow dietary modifications and continue physical therapy to manage symptoms. Oxycodone -acetaminophen  is prescribed for severe pain episodes, but use it cautiously due to the risk of bowel obstruction. Maintain contact with a surgeon for potential future interventions.  -CHRONIC MIGRAINE ASSOCIATED WITH TMS THERAPY: Your headaches are likely due to muscle tension from TMS therapy. Nurtec ODT is prescribed for migraine prevention, to be taken before TMS sessions. Zanaflex  is prescribed for muscle relaxation to alleviate headache symptoms. If these are ineffective, you may use oxycodone -acetaminophen . Be aware of potential side effects of Zanaflex , including sedation.  -MAJOR DEPRESSIVE DISORDER: Your depression is being managed with TMS therapy, which has improved your symptoms, especially nightmares. You are also taking escitalopram  and lurasidone . Saffron supplementation is recommended to enhance your mood. Continue with TMS therapy for depression management.  INSTRUCTIONS: Please follow up with your surgeon as needed for abdominal pain. Continue with your current physical therapy and dietary modifications. Take Nurtec ODT before TMS sessions and use Zanaflex  for muscle relaxation. If you experience severe pain, use oxycodone -acetaminophen   cautiously. Continue your current medications for depression and consider saffron supplementation. Follow up with your primary care provider as scheduled.  Your Providers PCP: [name] Gina MATSU, MD,  [phone]) Referring Provider: Cone Gina MATSU, MD,  [phone]) Care Team Provider: Jess Roby Masker, MD Care Team Provider: Shirly Carlin LITTIE [name],  [phone]) Care Team Provider: Dale Cough, MD,  [phone]) Care Team Provider: Mat Browning, MD,  [phone])  NEXT STEPS: [x]  Early Intervention: Schedule sooner appointment, call our on-call services, or go to emergency room if there is any significant Increase in pain or discomfort New or worsening symptoms Sudden or severe changes in your health [x]  Flexible Follow-Up: We recommend a No follow-ups on file. for optimal routine care. This allows for progress monitoring and treatment adjustments. [x]  Preventive Care: Schedule your annual preventive care visit! It's typically covered by insurance and helps identify potential health issues early. [x]  Lab & X-ray Appointments: Incomplete tests scheduled today, or call to schedule. X-rays: [facility] (M-F, 8:30am-noon or 1pm-5pm). [x]  Medical Information Release: Sign a release form at front desk to obtain relevant medical information we don't have.  MAKING THE MOST OF OUR FOCUSED 20 MINUTE APPOINTMENTS: [x]   Clearly state your top concerns at the beginning of the visit to focus our discussion [x]   If you anticipate you will need more time, please inform the front desk during scheduling - we can book multiple appointments in the same week. [x]   If you have transportation problems- use our convenient video appointments or ask about transportation support. [x]   We can get down to business faster if you use MyChart to update information before the visit and submit non-urgent questions before your visit. Thank you for taking the time to provide details  through MyChart.  Let our nurse know and she can import this information into  your encounter documents.  Arrival and Wait Times: [x]   Arriving on time ensures that everyone receives prompt attention. [x]   Early morning (8a) and afternoon (1p) appointments tend to have shortest wait times. [x]   Unfortunately, we cannot delay appointments for late arrivals or hold slots during phone calls.  Getting Answers and Following Up [x]   Simple Questions & Concerns: For quick questions or basic follow-up after your visit, reach us  at [phone] or MyChart messaging. [x]   Complex Concerns: If your concern is more complex, scheduling an appointment might be best. Discuss this with the staff to find the most suitable option. [x]   Lab & Imaging Results: We'll contact you directly if results are abnormal or you don't use MyChart. Most normal results will be on MyChart within 2-3 business days, with a review message from Dr. Jesus. Haven't heard back in 2 weeks? Need results sooner? Contact us  at [phone]. [x]   Referrals: Our referral coordinator will manage specialist referrals. The specialist's office should contact you within 2 weeks to schedule an appointment. Call us  if you haven't heard from them after 2 weeks.  Staying Connected [x]   MyChart: Activate your MyChart for the fastest way to access results and message us . See the last page of this paperwork for instructions on how to activate.  Bring to Your Next Appointment [x]   Medications: Please bring all your medication bottles to your next appointment to ensure we have an accurate record of your prescriptions. [x]   Health Diaries: If you're monitoring any health conditions at home, keeping a diary of your readings can be very helpful for discussions at your next appointment.  Billing [x]   X-ray & Lab Orders: These are billed by separate companies. Contact the invoicing company directly for questions or concerns. [x]   Visit Charges: Discuss  any billing inquiries with our administrative services team.  Your Satisfaction Matters [x]   Share Your Experience: We strive for your satisfaction! If you have any complaints, or preferably compliments, please let Dr. Jesus know directly or contact our Practice Administrators, Manuelita Rubin or [employer], by asking at the front desk.   Reviewing Your Records [x]   Review this early draft of your clinical encounter notes below and the final encounter summary tomorrow on MyChart after its been completed.  All orders placed so far are visible here: Chronic migraine without aura, intractable, with status migrainosus -     Ambulatory referral to Physical Therapy -     Nurtec; Take 1 tablet (75 mg total) by mouth daily.  Dispense: 10 tablet; Refill: 2  Generalized abdominal pain -     oxyCODONE -Acetaminophen ; Take 1 tablet by mouth every 4 (four) hours as needed for pain.  Dispense: 15 tablet; Refill: 0  Abdominal adhesions -     oxyCODONE -Acetaminophen ; Take 1 tablet by mouth every 4 (four) hours as needed for pain.  Dispense: 15 tablet; Refill: 0  Tension headache -     tiZANidine  HCl; Take 1 tablet (4 mg total) by mouth every 6 (six) hours as needed for muscle spasms.  Dispense: 30 tablet; Refill: 0  Depressive disorder         Managing Abdominal Pain Flares at Home: A Guide for Patients with Adhesions What Are Adhesions?  Adhesions are bands of scar tissue that can form inside your abdomen after surgery. They can sometimes cause pain, even years after your operation. The good news is that many pain flares can be managed at home without needing to return to the hospital. When  to Seek Emergency Care   Go to the emergency department immediately if you experience:  - Severe, constant abdominal pain that keeps getting worse  - Inability to pass gas or have a bowel movement for more than 24 hours  - Vomiting that won't stop, especially if vomit looks green or brown  - Fever  over 101F (38.3C)  - Abdominal swelling that is rapidly increasing  - Blood in your stool or vomit  - Severe weakness or dizziness Managing Pain Flares at Home  Immediate Comfort Measures   Heat therapy: Apply a heating pad or warm compress to the painful area for 15-20 minutes at a time. This can help relax muscles and reduce discomfort.[1][2]  Rest in comfortable positions: Try lying on your side with knees bent, or find positions that reduce tension on your abdomen. Gentle position changes may help.  Gentle abdominal massage: Using light circular motions on your abdomen may provide relief. Avoid pressing hard on painful areas.  Breathing exercises: Slow, deep breathing can help reduce pain and anxiety. Breathe in slowly through your nose for 4 counts, hold for 4 counts, then exhale slowly through your mouth for 6 counts. Pain Medications   Over-the-counter options (always follow package directions):  - Acetaminophen  (Tylenol ): [id number] mg every 6-8 hours as needed  - Ibuprofen (Advil, Motrin): 400-600 mg every 6-8 hours with food  - Naproxen  (Aleve ): 220-440 mg every 8-12 hours with food  Important: Do not exceed recommended doses. Avoid NSAIDs (ibuprofen, naproxen ) if you have kidney problems, stomach ulcers, or are on blood thinners unless approved by your doctor.  If over-the-counter medications don't help after 2-3 days, contact your doctor about prescription options. Some patients benefit from medications that treat nerve pain, such as gabapentin  or pregabalin.[1][3] Diet During Pain Flares   - Start with clear liquids (broth, tea, clear juice) if pain is severe  - Progress to bland, easy-to-digest foods (rice, toast, bananas, applesauce)  - Eat small, frequent meals rather than large meals  - Avoid foods that cause gas or bloating (beans, cabbage, carbonated drinks)  - Stay well-hydrated with water throughout the day  - Gradually return to your normal diet as  pain improves Activity and Movement   Gentle movement is important, even during pain flares:  - Take short, slow walks around your home several times daily  - Avoid heavy lifting (nothing over 10-15 pounds during flares)  - Do gentle stretching exercises when comfortable  - Avoid sudden twisting or bending movements  - Listen to your body--rest when needed, but avoid prolonged bed rest Long-Term Management Strategies   Physical therapy: Ask your doctor about referral to a physical therapist who specializes in abdominal or pelvic pain. Specialized soft tissue mobilization techniques have helped some patients with adhesion-related pain.[2]  Stress management: Chronic pain and stress create a cycle. Consider:  - Meditation or mindfulness apps  - Yoga (gentle, modified poses)  - Deep breathing exercises daily  - Counseling or cognitive behavioral therapy[1][4]  Regular exercise: When not having a flare, maintain regular gentle exercise such as walking, swimming, or yoga. This can help reduce the frequency and severity of pain episodes.[4]  Healthy lifestyle:  - Maintain a healthy weight  - Get adequate sleep (7-9 hours nightly)  - Avoid smoking  - Limit alcohol  - Eat a balanced, high-fiber diet to prevent constipation[4] Tracking Your Pain   Keep a pain diary to help identify triggers and patterns:  - Rate your pain from 0-10 each  day  - Note what you were doing when pain started  - Record what helped or didn't help  - Track bowel movements  - Note foods that seem to trigger symptoms  Share this information with your doctor at follow-up appointments. When to Call Your Doctor (Non-Emergency)   Contact your doctor's office if:  - Pain is not improving after 3-4 days of home management  - You need pain medication refills  - You're having frequent pain flares (more than once per week)  - Over-the-counter medications are no longer effective  - You have  new or changing symptoms  - You're having difficulty eating or losing weight unintentionally Important Reminders   - Avoid repeated surgery when possible: Research shows that repeat surgery for adhesions has limited long-term benefit and carries risks, including the formation of new adhesions.[1]  - Opioid pain medications are not recommended for long-term management of adhesion pain due to risks of dependence and side effects.[1][5]  - You are not alone: Chronic abdominal pain after surgery affects 11-20% of patients. Many people successfully manage their symptoms with conservative approaches.[6]  - Multidisciplinary care works best: The most effective approach often combines medication, physical therapy, stress management, and lifestyle modifications.[1][4] Questions for Your Next Doctor's Appointment   - Would physical therapy or pelvic floor therapy help my pain?  - Are there prescription medications that might work better for me?  - Would a referral to a pain specialist be helpful?  - Are there any complementary therapies (acupuncture, massage) you would recommend?  - Based on my symptoms, do I need any additional testing?  Remember: Most adhesion-related pain flares can be managed successfully at home with these strategies. However, always trust your instincts--if something feels seriously wrong, seek medical attention promptly. References Chronic Pelvic Pain: ACOG Practice Bulletin, Number 218. Obstetrics and Gynecology. 2020;135(3):e98-e109. doi:10.1097/AOG.[id number]. Soft Tissue Mobilization to Resolve Chronic Pain and Dysfunction Associated With Postoperative Abdominal and Pelvic Adhesions: A Case Report. Cyrena DEEM, Smith RW, Koppenhaver S. The Journal of Orthopaedic and Sports Physical Therapy. 2015;45(12):1006-16. doi:10.2519/jospt.7984.4233. Pregabalin for the Treatment of Abdominal Adhesion Pain: a Randomized, Double-Blind, Placebo-Controlled Trial. Joen LABOR,  Hezekiah RODES, Pervis DEL, Nawras A. American Journal of Therapeutics. 2012;19(6):419-28. doi:10.1097/MJT.[id number]. Chronic Pain: An Update on Burden, Best Practices, and New Advances. Cohen SP, Vase L, Hooten WM. Lancet Vick, [place]). 2021;397(10289):2082-2097. doi:10.1016/S0140-6736(21)00393-7. Chronic Pelvic Pain in Women: A Review. Lamvu G, Carrillo J, Ouyang C, Rapkin A. JAMA. 2021;325(23):2381-2391. doi:10.1001/jama.7978.7368. A Shared Decision Approach to Chronic Abdominal Pain Based on Cine-Mri: A Prospective Cohort Study. fleeta den Beukel BAW, Stommel MWJ, fleeta Vina RAMAN, et al. The American Journal of Gastroenterology. 2018;113(8):1229-1237. doi:10.1038/s41395-810-662-4562-9.

## 2024-12-05 NOTE — Assessment & Plan Note (Signed)
 Recurrent abdominal pain is likely due to adhesions, given her history of multiple abdominal surgeries and a recent CT scan showing no acute findings. The pain is severe and intermittent, with a recent episode requiring an ER visit. Surgery is not recommended due to the risk of recurrence and potential complications, and she is apprehensive about it due to previous experiences. A handout on managing adhesion-related pain without surgery was provided. She should maintain contact with a surgeon for potential future interventions. Dietary modifications and physical therapy are advised to manage symptoms. Oxycodone -acetaminophen  is prescribed for severe pain episodes, with caution regarding potential bowel obstruction. Continued physical therapy and pelvic floor exercises are encouraged.

## 2024-12-19 ENCOUNTER — Ambulatory Visit: Admitting: Dermatology

## 2025-01-02 ENCOUNTER — Ambulatory Visit: Admitting: Internal Medicine

## 2025-09-11 ENCOUNTER — Ambulatory Visit
